# Patient Record
Sex: Male | Born: 1935 | Race: White | Hispanic: No | Marital: Married | Smoking: Never smoker
Health system: Southern US, Community
[De-identification: ages and names within clinical notes are randomized; demographics above are authoritative.]

## PROBLEM LIST (undated history)

## (undated) DIAGNOSIS — S7291XA Unspecified fracture of right femur, initial encounter for closed fracture: Secondary | ICD-10-CM

## (undated) DIAGNOSIS — K219 Gastro-esophageal reflux disease without esophagitis: Secondary | ICD-10-CM

## (undated) DIAGNOSIS — C61 Malignant neoplasm of prostate: Secondary | ICD-10-CM

## (undated) DIAGNOSIS — R339 Retention of urine, unspecified: Secondary | ICD-10-CM

## (undated) DIAGNOSIS — R234 Changes in skin texture: Secondary | ICD-10-CM

## (undated) DIAGNOSIS — I499 Cardiac arrhythmia, unspecified: Secondary | ICD-10-CM

## (undated) DIAGNOSIS — F419 Anxiety disorder, unspecified: Secondary | ICD-10-CM

## (undated) DIAGNOSIS — L98424 Non-pressure chronic ulcer of back with necrosis of bone: Secondary | ICD-10-CM

## (undated) DIAGNOSIS — N390 Urinary tract infection, site not specified: Secondary | ICD-10-CM

## (undated) DIAGNOSIS — K429 Umbilical hernia without obstruction or gangrene: Secondary | ICD-10-CM

## (undated) DIAGNOSIS — C679 Malignant neoplasm of bladder, unspecified: Secondary | ICD-10-CM

## (undated) DIAGNOSIS — I10 Essential (primary) hypertension: Secondary | ICD-10-CM

## (undated) DIAGNOSIS — M1990 Unspecified osteoarthritis, unspecified site: Secondary | ICD-10-CM

## (undated) DIAGNOSIS — Z9289 Personal history of other medical treatment: Secondary | ICD-10-CM

## (undated) DIAGNOSIS — D509 Iron deficiency anemia, unspecified: Secondary | ICD-10-CM

## (undated) HISTORY — DX: Iron deficiency anemia, unspecified: D50.9

## (undated) HISTORY — PX: CARPAL TUNNEL RELEASE: SHX101

## (undated) HISTORY — DX: Malignant neoplasm of bladder, unspecified: C67.9

---
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
## 2002-11-07 HISTORY — PX: CHOLECYSTECTOMY: SHX55

## 2010-09-24 ENCOUNTER — Other Ambulatory Visit: Payer: Self-pay | Admitting: Internal Medicine

## 2010-09-29 ENCOUNTER — Ambulatory Visit: Payer: Self-pay | Admitting: Internal Medicine

## 2010-10-11 ENCOUNTER — Ambulatory Visit: Payer: Self-pay | Admitting: Orthopedic Surgery

## 2011-01-27 DIAGNOSIS — C61 Malignant neoplasm of prostate: Secondary | ICD-10-CM | POA: Insufficient documentation

## 2011-02-08 ENCOUNTER — Ambulatory Visit: Payer: Self-pay | Admitting: Urology

## 2011-02-10 ENCOUNTER — Ambulatory Visit: Payer: Self-pay | Admitting: Urology

## 2011-03-15 ENCOUNTER — Ambulatory Visit: Payer: Self-pay | Admitting: Radiation Oncology

## 2011-04-08 ENCOUNTER — Ambulatory Visit: Payer: Self-pay | Admitting: Radiation Oncology

## 2011-05-08 ENCOUNTER — Ambulatory Visit: Payer: Self-pay | Admitting: Radiation Oncology

## 2011-06-08 ENCOUNTER — Ambulatory Visit: Payer: Self-pay | Admitting: Radiation Oncology

## 2011-07-09 ENCOUNTER — Ambulatory Visit: Payer: Self-pay | Admitting: Radiation Oncology

## 2011-08-08 ENCOUNTER — Ambulatory Visit: Payer: Self-pay | Admitting: Radiation Oncology

## 2011-11-21 ENCOUNTER — Ambulatory Visit: Payer: Self-pay | Admitting: Radiation Oncology

## 2011-12-09 ENCOUNTER — Ambulatory Visit: Payer: Self-pay | Admitting: Radiation Oncology

## 2012-05-23 ENCOUNTER — Ambulatory Visit: Payer: Self-pay | Admitting: Radiation Oncology

## 2012-05-24 LAB — PSA: PSA: 0.2 ng/mL (ref 0.0–4.0)

## 2012-06-07 ENCOUNTER — Ambulatory Visit: Payer: Self-pay | Admitting: Radiation Oncology

## 2013-05-21 ENCOUNTER — Ambulatory Visit: Payer: Self-pay | Admitting: Radiation Oncology

## 2013-06-07 ENCOUNTER — Ambulatory Visit: Payer: Self-pay | Admitting: Radiation Oncology

## 2014-05-23 ENCOUNTER — Ambulatory Visit: Payer: Self-pay | Admitting: Radiation Oncology

## 2014-06-24 DIAGNOSIS — M169 Osteoarthritis of hip, unspecified: Secondary | ICD-10-CM | POA: Insufficient documentation

## 2014-06-24 DIAGNOSIS — M5416 Radiculopathy, lumbar region: Secondary | ICD-10-CM | POA: Insufficient documentation

## 2015-01-26 DIAGNOSIS — Z7185 Encounter for immunization safety counseling: Secondary | ICD-10-CM | POA: Insufficient documentation

## 2015-01-26 DIAGNOSIS — Z7189 Other specified counseling: Secondary | ICD-10-CM | POA: Insufficient documentation

## 2015-05-27 ENCOUNTER — Inpatient Hospital Stay: Payer: Medicare Other | Attending: Radiation Oncology

## 2015-05-27 ENCOUNTER — Ambulatory Visit
Admission: RE | Admit: 2015-05-27 | Discharge: 2015-05-27 | Disposition: A | Discharge status: Home / Self Care | Payer: Medicare Other | Source: Ambulatory Visit | Attending: Radiation Oncology | Admitting: Radiation Oncology

## 2015-05-27 ENCOUNTER — Other Ambulatory Visit: Payer: Self-pay | Admitting: *Deleted

## 2015-05-27 ENCOUNTER — Encounter: Payer: Self-pay | Admitting: Radiation Oncology

## 2015-05-27 VITALS — BP 118/70 | HR 85 | Temp 95.7°F | Resp 20 | Wt 195.9 lb

## 2015-05-27 DIAGNOSIS — C61 Malignant neoplasm of prostate: Secondary | ICD-10-CM

## 2015-05-27 HISTORY — DX: Unspecified osteoarthritis, unspecified site: M19.90

## 2015-05-27 HISTORY — DX: Anxiety disorder, unspecified: F41.9

## 2015-05-27 LAB — PSA: PSA: 0.05 ng/mL (ref 0.00–4.00)

## 2015-05-27 NOTE — Progress Notes (Signed)
Radiation Oncology Follow up Note  Name: TURNEY BLOUNT   Date:   05/27/2015 MRN:  [id number] DOB: 12-Apr-1935    This 80 y.o. male presents to the clinic today for follow-up for prostate cancer.  REFERRING PROVIDER: No ref. provider found  HPI: patient is a 80 year old male now out 4 years having completed IM RT radiation therapy for a Gleason 7 adenocarcinoma the prostate presenting the PSA of 29. He was a clinical stage IIB. We treated both his prostate and pelvic nodes. He is seen today in routine follow-up and is doing well. He specifically denies diarrhea dysuria or any other GI/GU complaints. His PSAs have been stable and less than 1 over the past several years..  COMPLICATIONS OF TREATMENT: none  FOLLOW UP COMPLIANCE: keeps appointments   PHYSICAL EXAM:  BP 118/70 mmHg  Pulse 85  Temp(Src) 95.7 F (35.4 C) (Oral)  Resp 20  Wt 195 lb 14.1 oz (88.85 kg) On rectal exam rectal sphincter tone is good. Prostate is smooth contracted without evidence of nodularity or mass. Sulcus is preserved bilaterally. No discrete nodularity is identified. No other rectal abnormalities are noted. Well-developed well-nourished patient in NAD. HEENT reveals PERLA, EOMI, discs not visualized.  Oral cavity is clear. No oral mucosal lesions are identified. Neck is clear without evidence of cervical or supraclavicular adenopathy. Lungs are clear to A&P. Cardiac examination is essentially unremarkable with regular rate and rhythm without murmur rub or thrill. Abdomen is benign with no organomegaly or masses noted. Motor sensory and DTR levels are equal and symmetric in the upper and lower extremities. Cranial nerves II through XII are grossly intact. Proprioception is intact. No peripheral adenopathy or edema is identified. No motor or sensory levels are noted. Crude visual fields are within normal range.   RADIOLOGY RESULTS: no current films to review  PLAN: at the present time he continues to do well. I  have run a PSA level on him today and will report that separately. I'm otherwise please was overall progress. I've asked to see him back in 1 year for follow-up. Patient is to call sooner with any concerns. Should his PSA show any significant change will make further recommendations.  I would like to take this opportunity for allowing me to participate in the care of your patient.Armstead Peaks., MD

## 2015-06-03 ENCOUNTER — Telehealth: Payer: Self-pay | Admitting: *Deleted

## 2015-06-03 NOTE — Telephone Encounter (Signed)
Pt is requesting her husband's PSA results.Marland KitchenMarland Kitchen

## 2015-12-18 ENCOUNTER — Other Ambulatory Visit: Payer: Medicare Other

## 2015-12-18 ENCOUNTER — Ambulatory Visit: Payer: Medicare Other | Admitting: Radiation Oncology

## 2015-12-24 ENCOUNTER — Other Ambulatory Visit: Payer: Self-pay | Admitting: *Deleted

## 2015-12-24 ENCOUNTER — Ambulatory Visit
Admission: RE | Admit: 2015-12-24 | Discharge: 2015-12-24 | Disposition: A | Discharge status: Home / Self Care | Payer: Medicare Other | Source: Ambulatory Visit | Attending: Radiation Oncology | Admitting: Radiation Oncology

## 2015-12-24 ENCOUNTER — Inpatient Hospital Stay: Payer: Medicare Other | Attending: Radiation Oncology

## 2015-12-24 ENCOUNTER — Encounter: Payer: Self-pay | Admitting: Radiation Oncology

## 2015-12-24 VITALS — BP 146/75 | HR 77 | Resp 20 | Ht 71.0 in | Wt 208.1 lb

## 2015-12-24 DIAGNOSIS — C61 Malignant neoplasm of prostate: Secondary | ICD-10-CM

## 2015-12-24 DIAGNOSIS — Z8546 Personal history of malignant neoplasm of prostate: Secondary | ICD-10-CM | POA: Diagnosis not present

## 2015-12-24 LAB — PSA: PSA: 0.05 ng/mL (ref 0.00–4.00)

## 2015-12-24 NOTE — Progress Notes (Signed)
Radiation Oncology Follow up Note  Name: Vincent Poole   Date:   12/24/2015 MRN:  [id number] DOB: [birth year]    This 81 y.o. male presents to the clinic today for follow-up for prostate cancer stage IIB now out over 5 years.  REFERRING PROVIDER: Melynda Ripple, MD  HPI: Patient is a 81 year old male now out over 5 years having completed radiation therapy for Gleason 7 adenocarcinoma the prostate presenting the PSA of 29. His initial stage was to be. We treated both prostate and pelvic nodes. He is seen today in routine follow-up and is doing well.Marland Kitchen His PSA has remained in the undetectable range last one was 0.05 back in July 2016. He had a repeat PSA today which will be reported separately. His major complaint is swelling and occasional pain of his right breast. He otherwise is without complaint specifically denies increased lower urinary tract symptoms or diarrhea.  COMPLICATIONS OF TREATMENT: none  FOLLOW UP COMPLIANCE: keeps appointments   PHYSICAL EXAM:  BP 146/75 mmHg  Pulse 77  Resp 20  Ht 5\' 11"  (1.803 m)  Wt 208 lb 1.8 oz (94.4 kg)  BMI 29.04 kg/m2 On rectal exam rectal sphincter tone is good. Prostate is smooth contracted without evidence of nodularity or mass. Sulcus is preserved bilaterally. No discrete nodularity is identified. No other rectal abnormalities are noted. Right breast is somewhat diffusely enlarged consistent with gynecomastia no discrete nodularity or mass is identified. No axillary or supraclavicular adenopathy is noted. Well-developed well-nourished patient in NAD. HEENT reveals PERLA, EOMI, discs not visualized.  Oral cavity is clear. No oral mucosal lesions are identified. Neck is clear without evidence of cervical or supraclavicular adenopathy. Lungs are clear to A&P. Cardiac examination is essentially unremarkable with regular rate and rhythm without murmur rub or thrill. Abdomen is benign with no organomegaly or masses noted. Motor sensory and DTR levels are  equal and symmetric in the upper and lower extremities. Cranial nerves II through XII are grossly intact. Proprioception is intact. No peripheral adenopathy or edema is identified. No motor or sensory levels are noted. Crude visual fields are within normal range.  RADIOLOGY RESULTS: No current films for review  PLAN: I wrote a PSA level on him today and will report that separately. He is done extremely well with undetectable PSA levels over time. Not quite sure what to do about his presumed right gynecomastia. Referral for surgeon or possible short trial of tamoxifen should his symptoms worsen may be appropriate. I've otherwise asked to see him back in 1 year for follow-up. Patient is to call sooner with any concerns.  I would like to take this opportunity for allowing me to participate in the care of your patient.Armstead Peaks., MD

## 2016-01-26 DIAGNOSIS — R7303 Prediabetes: Secondary | ICD-10-CM | POA: Insufficient documentation

## 2016-07-10 DIAGNOSIS — Z923 Personal history of irradiation: Secondary | ICD-10-CM

## 2016-07-10 DIAGNOSIS — R300 Dysuria: Secondary | ICD-10-CM | POA: Diagnosis not present

## 2016-07-10 DIAGNOSIS — Z8546 Personal history of malignant neoplasm of prostate: Secondary | ICD-10-CM

## 2016-07-10 DIAGNOSIS — B964 Proteus (mirabilis) (morganii) as the cause of diseases classified elsewhere: Secondary | ICD-10-CM | POA: Diagnosis present

## 2016-07-10 DIAGNOSIS — N3001 Acute cystitis with hematuria: Secondary | ICD-10-CM | POA: Diagnosis present

## 2016-07-10 DIAGNOSIS — I959 Hypotension, unspecified: Secondary | ICD-10-CM | POA: Diagnosis present

## 2016-07-10 DIAGNOSIS — I248 Other forms of acute ischemic heart disease: Secondary | ICD-10-CM | POA: Diagnosis present

## 2016-07-10 DIAGNOSIS — A419 Sepsis, unspecified organism: Principal | ICD-10-CM | POA: Diagnosis present

## 2016-07-10 DIAGNOSIS — N138 Other obstructive and reflux uropathy: Secondary | ICD-10-CM | POA: Diagnosis present

## 2016-07-10 DIAGNOSIS — N401 Enlarged prostate with lower urinary tract symptoms: Secondary | ICD-10-CM | POA: Diagnosis present

## 2016-07-10 DIAGNOSIS — Z8249 Family history of ischemic heart disease and other diseases of the circulatory system: Secondary | ICD-10-CM

## 2016-07-10 DIAGNOSIS — Z9221 Personal history of antineoplastic chemotherapy: Secondary | ICD-10-CM

## 2016-07-10 DIAGNOSIS — M1990 Unspecified osteoarthritis, unspecified site: Secondary | ICD-10-CM | POA: Diagnosis present

## 2016-07-10 DIAGNOSIS — R338 Other retention of urine: Secondary | ICD-10-CM | POA: Diagnosis present

## 2016-07-11 ENCOUNTER — Inpatient Hospital Stay
Admission: EM | Admit: 2016-07-11 | Discharge: 2016-07-13 | DRG: 872 | Disposition: A | Discharge status: Home / Self Care | Payer: Medicare Other | Attending: Internal Medicine | Admitting: Internal Medicine

## 2016-07-11 ENCOUNTER — Encounter: Payer: Self-pay | Admitting: Emergency Medicine

## 2016-07-11 DIAGNOSIS — R000 Tachycardia, unspecified: Secondary | ICD-10-CM

## 2016-07-11 DIAGNOSIS — R300 Dysuria: Secondary | ICD-10-CM

## 2016-07-11 DIAGNOSIS — B964 Proteus (mirabilis) (morganii) as the cause of diseases classified elsewhere: Secondary | ICD-10-CM | POA: Diagnosis present

## 2016-07-11 DIAGNOSIS — R509 Fever, unspecified: Secondary | ICD-10-CM

## 2016-07-11 DIAGNOSIS — M1990 Unspecified osteoarthritis, unspecified site: Secondary | ICD-10-CM | POA: Diagnosis present

## 2016-07-11 DIAGNOSIS — R339 Retention of urine, unspecified: Secondary | ICD-10-CM

## 2016-07-11 DIAGNOSIS — Z923 Personal history of irradiation: Secondary | ICD-10-CM | POA: Diagnosis not present

## 2016-07-11 DIAGNOSIS — I248 Other forms of acute ischemic heart disease: Secondary | ICD-10-CM | POA: Diagnosis present

## 2016-07-11 DIAGNOSIS — I959 Hypotension, unspecified: Secondary | ICD-10-CM | POA: Diagnosis present

## 2016-07-11 DIAGNOSIS — Z8249 Family history of ischemic heart disease and other diseases of the circulatory system: Secondary | ICD-10-CM | POA: Diagnosis not present

## 2016-07-11 DIAGNOSIS — N390 Urinary tract infection, site not specified: Secondary | ICD-10-CM

## 2016-07-11 DIAGNOSIS — A419 Sepsis, unspecified organism: Secondary | ICD-10-CM | POA: Diagnosis present

## 2016-07-11 DIAGNOSIS — N138 Other obstructive and reflux uropathy: Secondary | ICD-10-CM | POA: Diagnosis present

## 2016-07-11 DIAGNOSIS — N401 Enlarged prostate with lower urinary tract symptoms: Secondary | ICD-10-CM | POA: Diagnosis present

## 2016-07-11 DIAGNOSIS — Z9221 Personal history of antineoplastic chemotherapy: Secondary | ICD-10-CM | POA: Diagnosis not present

## 2016-07-11 DIAGNOSIS — Z8546 Personal history of malignant neoplasm of prostate: Secondary | ICD-10-CM | POA: Diagnosis not present

## 2016-07-11 DIAGNOSIS — R338 Other retention of urine: Secondary | ICD-10-CM | POA: Diagnosis present

## 2016-07-11 DIAGNOSIS — N3001 Acute cystitis with hematuria: Secondary | ICD-10-CM | POA: Diagnosis present

## 2016-07-11 HISTORY — DX: Malignant neoplasm of prostate: C61

## 2016-07-11 LAB — CBC WITH DIFFERENTIAL/PLATELET
Basophils Absolute: 0.2 10*3/uL — ABNORMAL HIGH (ref 0–0.1)
Basophils Relative: 2 %
EOS ABS: 0.1 10*3/uL (ref 0–0.7)
Eosinophils Relative: 1 %
HCT: 39.6 % — ABNORMAL LOW (ref 40.0–52.0)
HEMOGLOBIN: 13.5 g/dL (ref 13.0–18.0)
LYMPHS ABS: 0.2 10*3/uL — AB (ref 1.0–3.6)
LYMPHS PCT: 2 %
MCH: 30.5 pg (ref 26.0–34.0)
MCHC: 34.1 g/dL (ref 32.0–36.0)
MCV: 89.4 fL (ref 80.0–100.0)
MONOS PCT: 2 %
Monocytes Absolute: 0.2 10*3/uL (ref 0.2–1.0)
NEUTROS PCT: 93 %
Neutro Abs: 11 10*3/uL — ABNORMAL HIGH (ref 1.4–6.5)
Platelets: 190 10*3/uL (ref 150–440)
RBC: 4.43 MIL/uL (ref 4.40–5.90)
RDW: 14.5 % (ref 11.5–14.5)
WBC: 11.7 10*3/uL — ABNORMAL HIGH (ref 3.8–10.6)

## 2016-07-11 LAB — COMPREHENSIVE METABOLIC PANEL
ALT: 14 U/L — AB (ref 17–63)
AST: 20 U/L (ref 15–41)
Albumin: 3.6 g/dL (ref 3.5–5.0)
Alkaline Phosphatase: 79 U/L (ref 38–126)
Anion gap: 6 (ref 5–15)
BUN: 27 mg/dL — AB (ref 6–20)
CHLORIDE: 101 mmol/L (ref 101–111)
CO2: 26 mmol/L (ref 22–32)
CREATININE: 1.16 mg/dL (ref 0.61–1.24)
Calcium: 8.7 mg/dL — ABNORMAL LOW (ref 8.9–10.3)
GFR calc non Af Amer: 58 mL/min — ABNORMAL LOW (ref >60)
Glucose, Bld: 120 mg/dL — ABNORMAL HIGH (ref 65–99)
POTASSIUM: 3.2 mmol/L — AB (ref 3.5–5.1)
SODIUM: 133 mmol/L — AB (ref 135–145)
Total Bilirubin: 1.3 mg/dL — ABNORMAL HIGH (ref 0.3–1.2)
Total Protein: 6.4 g/dL — ABNORMAL LOW (ref 6.5–8.1)

## 2016-07-11 LAB — URINALYSIS COMPLETE WITH MICROSCOPIC (ARMC ONLY)
BILIRUBIN URINE: NEGATIVE
GLUCOSE, UA: 50 mg/dL — AB
NITRITE: NEGATIVE
SPECIFIC GRAVITY, URINE: 1.017 (ref 1.005–1.030)
Squamous Epithelial / LPF: NONE SEEN
pH: 5 (ref 5.0–8.0)

## 2016-07-11 LAB — BLOOD GAS, VENOUS
ACID-BASE EXCESS: 1.1 mmol/L (ref 0.0–2.0)
BICARBONATE: 23.4 mmol/L (ref 20.0–28.0)
O2 SAT: 83.4 %
PATIENT TEMPERATURE: 37
PH VEN: 7.5 — AB (ref 7.250–7.430)
pCO2, Ven: 30 mmHg — ABNORMAL LOW (ref 44.0–60.0)
pO2, Ven: 43 mmHg (ref 32.0–45.0)

## 2016-07-11 LAB — CBC
HCT: 35.7 % — ABNORMAL LOW (ref 40.0–52.0)
Hemoglobin: 12.6 g/dL — ABNORMAL LOW (ref 13.0–18.0)
MCH: 32 pg (ref 26.0–34.0)
MCHC: 35.4 g/dL (ref 32.0–36.0)
MCV: 90.4 fL (ref 80.0–100.0)
PLATELETS: 172 10*3/uL (ref 150–440)
RBC: 3.96 MIL/uL — ABNORMAL LOW (ref 4.40–5.90)
RDW: 14.9 % — AB (ref 11.5–14.5)
WBC: 12.8 10*3/uL — ABNORMAL HIGH (ref 3.8–10.6)

## 2016-07-11 LAB — TROPONIN I
Troponin I: 0.04 ng/mL (ref <0.03)
Troponin I: 0.2 ng/mL (ref <0.03)

## 2016-07-11 LAB — CREATININE, SERUM
Creatinine, Ser: 1.06 mg/dL (ref 0.61–1.24)
GFR calc Af Amer: >60 mL/min (ref >60)
GFR calc non Af Amer: >60 mL/min (ref >60)

## 2016-07-11 LAB — LACTIC ACID, PLASMA
LACTIC ACID, VENOUS: 1.6 mmol/L (ref 0.5–1.9)
Lactic Acid, Venous: 0.9 mmol/L (ref 0.5–1.9)

## 2016-07-11 MED ORDER — HYDROCODONE-ACETAMINOPHEN 5-325 MG PO TABS: 1.0000 | ORAL_TABLET | ORAL | Status: DC | PRN

## 2016-07-11 MED ORDER — ACETAMINOPHEN 650 MG RE SUPP: 650.0000 mg | Freq: Four times a day (QID) | RECTAL | Status: DC | PRN

## 2016-07-11 MED ORDER — ONDANSETRON HCL 4 MG PO TABS: 4.0000 mg | ORAL_TABLET | Freq: Four times a day (QID) | ORAL | Status: DC | PRN

## 2016-07-11 MED ORDER — DEXTROSE 5 % IV SOLN
1.0000 g | Freq: Once | INTRAVENOUS | Status: AC
  Administered 2016-07-11: 1 g via INTRAVENOUS
  Filled 2016-07-11: qty 10

## 2016-07-11 MED ORDER — ACETAMINOPHEN 500 MG PO TABS
1000.0000 mg | ORAL_TABLET | Freq: Once | ORAL | Status: AC
  Administered 2016-07-11: 1000 mg via ORAL
  Filled 2016-07-11: qty 2

## 2016-07-11 MED ORDER — DEXTROSE 5 % IV SOLN: 2.0000 g | Freq: Once | INTRAVENOUS | Status: DC

## 2016-07-11 MED ORDER — ACETAMINOPHEN 325 MG PO TABS
650.0000 mg | ORAL_TABLET | Freq: Four times a day (QID) | ORAL | Status: DC | PRN
  Administered 2016-07-11: 650 mg via ORAL
  Filled 2016-07-11: qty 2

## 2016-07-11 MED ORDER — SODIUM CHLORIDE 0.9 % IV BOLUS (SEPSIS)
1000.0000 mL | Freq: Once | INTRAVENOUS | Status: AC
  Administered 2016-07-11: 1000 mL via INTRAVENOUS

## 2016-07-11 MED ORDER — ENOXAPARIN SODIUM 40 MG/0.4ML ~~LOC~~ SOLN
40.0000 mg | Freq: Every day | SUBCUTANEOUS | Status: DC
  Administered 2016-07-11 – 2016-07-12 (×2): 40 mg via SUBCUTANEOUS
  Filled 2016-07-11 (×2): qty 0.4

## 2016-07-11 MED ORDER — SENNOSIDES-DOCUSATE SODIUM 8.6-50 MG PO TABS: 1.0000 | ORAL_TABLET | Freq: Every evening | ORAL | Status: DC | PRN

## 2016-07-11 MED ORDER — CEFTRIAXONE SODIUM 2 G IJ SOLR
2.0000 g | INTRAMUSCULAR | Status: DC
  Administered 2016-07-11 – 2016-07-12 (×2): 2 g via INTRAVENOUS
  Filled 2016-07-11 (×3): qty 2

## 2016-07-11 MED ORDER — TAMSULOSIN HCL 0.4 MG PO CAPS
0.4000 mg | ORAL_CAPSULE | Freq: Every day | ORAL | Status: DC
  Administered 2016-07-11 – 2016-07-12 (×2): 0.4 mg via ORAL
  Filled 2016-07-11 (×2): qty 1

## 2016-07-11 MED ORDER — FINASTERIDE 5 MG PO TABS
5.0000 mg | ORAL_TABLET | Freq: Every day | ORAL | Status: DC
  Administered 2016-07-11 – 2016-07-13 (×3): 5 mg via ORAL
  Filled 2016-07-11 (×3): qty 1

## 2016-07-11 MED ORDER — SODIUM CHLORIDE 0.9 % IV SOLN
INTRAVENOUS | Status: DC
  Administered 2016-07-11 (×2): via INTRAVENOUS

## 2016-07-11 MED ORDER — ONDANSETRON HCL 4 MG/2ML IJ SOLN
4.0000 mg | Freq: Four times a day (QID) | INTRAMUSCULAR | Status: DC | PRN
  Administered 2016-07-11: 4 mg via INTRAVENOUS
  Filled 2016-07-11: qty 2

## 2016-07-11 NOTE — Progress Notes (Addendum)
Pharmacy Antibiotic Note  Vincent Poole is a 81 y.o. male admitted on 07/11/2016 with UTI/sepsis.  Pharmacy has been consulted for ceftriaxone dosing.  Plan: Ceftriaxone 2 gm IV Q24H  Height: 5\' 9"  (175.3 cm) Weight: 201 lb (91.2 kg) IBW/kg (Calculated) : 70.7  Temp (24hrs), Avg:99.4 F (37.4 C), Min:98.2 F (36.8 C), Max:100.5 F (38.1 C)  No results for input(s): WBC, CREATININE, LATICACIDVEN, VANCOTROUGH, VANCOPEAK, VANCORANDOM, GENTTROUGH, GENTPEAK, GENTRANDOM, TOBRATROUGH, TOBRAPEAK, TOBRARND, AMIKACINPEAK, AMIKACINTROU, AMIKACIN in the last 168 hours.  CrCl cannot be calculated (No order found.).    No Known Allergies   Thank you for allowing pharmacy to be a part of this patient's care.  Laural Benes, Pharm.D., BCPS Clinical Pharmacist 07/11/2016 2:29 AM

## 2016-07-11 NOTE — Progress Notes (Signed)
[facility] PROGRESS NOTE  GRADY STROHSCHEIN [id number] DOB: September 03, 1935 DOA: 07/11/2016 PCP: [name], MD  HPI/Subjective: Patient states he feels all right now. Difficulty urinating prior to coming in. History of prostate cancer treated with radiation and chemotherapy as per the wife.  Objective: Vitals:   07/11/16 0651 07/11/16 1218  BP: (!) 107/57   Pulse: 93   Resp: 18   Temp: 98 F (36.7 C) (!) 102.7 F (39.3 C)    Filed Weights   07/10/16 2346 07/11/16 0651  Weight: 91.2 kg (201 lb) 93.7 kg (206 lb 8 oz)    ROS: Review of Systems  Constitutional: Positive for fever. Negative for chills.  Eyes: Negative for blurred vision.  Respiratory: Negative for cough and shortness of breath.   Cardiovascular: Negative for chest pain.  Gastrointestinal: Negative for abdominal pain, constipation, diarrhea, nausea and vomiting.  Genitourinary: Negative for dysuria.  Musculoskeletal: Negative for joint pain.  Neurological: Negative for dizziness and headaches.   Exam: Physical Exam  Constitutional: He is oriented to person, place, and time.  HENT:  Nose: No mucosal edema.  Mouth/Throat: No oropharyngeal exudate or posterior oropharyngeal edema.  Eyes: Conjunctivae, EOM and lids are normal. Pupils are equal, round, and reactive to light.  Neck: No JVD present. Carotid bruit is not present. No edema present. No thyroid mass and no thyromegaly present.  Cardiovascular: S1 normal and S2 normal.  Exam reveals no gallop.   No murmur heard. Pulses:      Dorsalis pedis pulses are 2+ on the right side, and 2+ on the left side.  Respiratory: No respiratory distress. He has decreased breath sounds in the right lower field and the left lower field. He has no wheezes. He has no rhonchi. He has no rales.  GI: Soft. Bowel sounds are normal. There is no tenderness.  Musculoskeletal:       Right ankle: He exhibits swelling.       Left ankle: He exhibits swelling.   Lymphadenopathy:    He has no cervical adenopathy.  Neurological: He is alert and oriented to person, place, and time. No cranial nerve deficit.  Skin: Skin is warm. No rash noted. Nails show no clubbing.  Psychiatric: He has a normal mood and affect.      Data Reviewed: Basic Metabolic Panel:  Recent Labs Lab 07/11/16 0233 07/11/16 0543  NA 133*  --   K 3.2*  --   CL 101  --   CO2 26  --   GLUCOSE 120*  --   BUN 27*  --   CREATININE 1.16 1.06  CALCIUM 8.7*  --    Liver Function Tests:  Recent Labs Lab 07/11/16 0233  AST 20  ALT 14*  ALKPHOS 79  BILITOT 1.3*  PROT 6.4*  ALBUMIN 3.6   CBC:  Recent Labs Lab 07/11/16 0233 07/11/16 0543  WBC 11.7* 12.8*  NEUTROABS 11.0*  --   HGB 13.5 12.6*  HCT 39.6* 35.7*  MCV 89.4 90.4  PLT 190 172   Cardiac Enzymes:  Recent Labs Lab 07/11/16 0233 07/11/16 0543  TROPONINI 0.04* 0.20*     Recent Results (from the past 240 hour(s))  Blood Culture (routine x 2)     Status: None (Preliminary result)   Collection Time: 07/11/16  2:33 AM  Result Value Ref Range Status   Specimen Description BLOOD RIGHT ANTECUBITAL  Final   Special Requests BOTTLES DRAWN AEROBIC AND ANAEROBIC 8CC  Final   Culture NO GROWTH < 12  HOURS  Final   Report Status PENDING  Incomplete  Blood Culture (routine x 2)     Status: None (Preliminary result)   Collection Time: 07/11/16  2:33 AM  Result Value Ref Range Status   Specimen Description BLOOD BLOOD LEFT WRIST  Final   Special Requests BOTTLES DRAWN AEROBIC AND ANAEROBIC 8CC  Final   Culture NO GROWTH < 12 HOURS  Final   Report Status PENDING  Incomplete      Scheduled Meds: . cefTRIAXone (ROCEPHIN)  IV  2 g Intravenous Q24H  . enoxaparin (LOVENOX) injection  40 mg Subcutaneous Daily  . finasteride  5 mg Oral Daily  . tamsulosin  0.4 mg Oral QPC supper   Continuous Infusions: . sodium chloride 50 mL/hr at 07/11/16 1222    Assessment/Plan:  1. Clinical sepsis present on  admission with fever and tachycardia and leukocytosis. Acute cystitis with hematuria is the likely source secondary to urinary retention. Continue IV Rocephin and watch for urine culture results. So far blood cultures are negative. 2. Urinary retention and BPH. Start finasteride and Flomax.  Foley catheter to remain in for 1 week. Nursing staff to teach the family had a drain urinary catheter. 3. Relative hypotension hold lisinopril HCT 4. Arthritis hold Naprosyn 5. History of prostate cancer. Will need urology as outpatient.  Code Status:     Code Status Orders        Start     Ordered   07/11/16 0537  Full code  Continuous     07/11/16 0538    Code Status History    Date Active Date Inactive Code Status Order ID Comments User Context   This patient has a current code status but no historical code status.     Family Communication: Wife at the bedside Disposition Plan: Home in a few days  Antibiotics:  Rocephin  Time spent: 35 minutes  [place], [place]

## 2016-07-11 NOTE — ED Provider Notes (Signed)
[facility] Emergency Department Provider Note   ____________________________________________   First MD Initiated Contact with Patient 07/11/16 0222     (approximate)  I have reviewed the triage vital signs and the nursing notes.   HISTORY  Chief Complaint Dysuria and Urinary Retention    HPI SHON KELTY is a 81 y.o. male who presents to the ED from home with a chief complaint of urinary retention. Reports history of prostate cancer status post radiation who has been having urinary symptoms for the past several weeks, worse in the past 2 days. Patient reports he has been unable to urinate since 4 PM. States he had hematuria last night but none since. Symptoms associated with pain on attempting urination. Denies associated fever, chills, chest pain, shortness of breath, nausea, vomiting, diarrhea.Denies recent travel or trauma. Nothing makes his symptoms better or worse.   Past Medical History:  Diagnosis Date  . Anxiety   . Arthritis   . Cervical cancer ([place])     There are no active problems to display for this patient.   Past Surgical History:  Procedure Laterality Date  . APPENDECTOMY      Prior to Admission medications   Medication Sig Start Date End Date Taking? Authorizing Provider  lisinopril-hydrochlorothiazide (PRINZIDE,ZESTORETIC) 10-12.5 MG per tablet Take by mouth. 07/03/12  Yes Historical Provider, MD  naproxen sodium (RA NAPROXEN SODIUM) 220 MG tablet Take by mouth. Reported on 12/24/2015   Yes Historical Provider, MD    Allergies Review of patient's allergies indicates no known allergies.  History reviewed. No pertinent family history.  Social History Social History  Substance Use Topics  . Smoking status: Never Smoker  . Smokeless tobacco: Never Used  . Alcohol use No    Review of Systems  Constitutional: No fever/chills. Eyes: No visual changes. ENT: No sore throat. Cardiovascular: Denies chest  pain. Respiratory: Denies shortness of breath. Gastrointestinal: No abdominal pain.  No nausea, no vomiting.  No diarrhea.  No constipation. Genitourinary: Positive for dysuria, hematuria and urinary retention. Musculoskeletal: Negative for back pain. Skin: Negative for rash. Neurological: Negative for headaches, focal weakness or numbness.  10-point ROS otherwise negative.  ____________________________________________   PHYSICAL EXAM:  VITAL SIGNS: ED Triage Vitals  Enc Vitals Group     BP 07/10/16 2346 (!) 164/66     Pulse Rate 07/10/16 2346 97     Resp 07/11/16 0204 20     Temp 07/10/16 2346 98.2 F (36.8 C)     Temp Source 07/10/16 2346 Oral     SpO2 07/10/16 2346 100 %     Weight 07/10/16 2346 201 lb (91.2 kg)     Height 07/10/16 2346 5\' 9"  (1.753 m)     Head Circumference --      Peak Flow --      Pain Score --      Pain Loc --      Pain Edu? --      Excl. in [place]? --    Constitutional: Alert and oriented. Well appearing and in no acute distress. Eyes: Conjunctivae are normal. PERRL. EOMI. Head: Atraumatic. Nose: No congestion/rhinnorhea. Mouth/Throat: Mucous membranes are moist.  Oropharynx non-erythematous. Neck: No stridor.  Supple neck without meningismus. Cardiovascular: Tachycardic rate, regular rhythm. Grossly normal heart sounds.  Good peripheral circulation. Respiratory: Normal respiratory effort.  No retractions. Lungs CTAB. Gastrointestinal: Soft and nontender. No distention. No abdominal bruits. No CVA tenderness. Genitourinary: Foley in place which was placed in triage. Musculoskeletal: No lower  extremity tenderness nor edema.  No joint effusions. Neurologic:  Normal speech and language. No gross focal neurologic deficits are appreciated. No gait instability. Skin:  Skin is warm, dry and intact. No rash noted. No petechiae. Psychiatric: Mood and affect are normal. Speech and behavior are normal.  ____________________________________________    LABS (all labs ordered are listed, but only abnormal results are displayed)  Labs Reviewed  URINALYSIS COMPLETEWITH MICROSCOPIC ([facility] ONLY) - Abnormal; Notable for the following:       Result Value   Color, Urine YELLOW (*)    APPearance CLOUDY (*)    Glucose, UA 50 (*)    Ketones, ur TRACE (*)    Hgb urine dipstick 2+ (*)    Protein, ur >500 (*)    Leukocytes, UA 3+ (*)    Bacteria, UA MANY (*)    All other components within normal limits  COMPREHENSIVE METABOLIC PANEL - Abnormal; Notable for the following:    Sodium 133 (*)    Potassium 3.2 (*)    Glucose, Bld 120 (*)    BUN 27 (*)    Calcium 8.7 (*)    Total Protein 6.4 (*)    ALT 14 (*)    Total Bilirubin 1.3 (*)    GFR calc non Af Amer 58 (*)    All other components within normal limits  CBC WITH DIFFERENTIAL/PLATELET - Abnormal; Notable for the following:    WBC 11.7 (*)    HCT 39.6 (*)    Neutro Abs 11.0 (*)    Lymphs Abs 0.2 (*)    Basophils Absolute 0.2 (*)    All other components within normal limits  TROPONIN I - Abnormal; Notable for the following:    Troponin I 0.04 (*)    All other components within normal limits  BLOOD GAS, VENOUS - Abnormal; Notable for the following:    pH, Ven 7.50 (*)    pCO2, Ven 30 (*)    All other components within normal limits  CULTURE, BLOOD (ROUTINE X 2)  CULTURE, BLOOD (ROUTINE X 2)  URINE CULTURE  LACTIC ACID, PLASMA  LACTIC ACID, PLASMA  TROPONIN I   ____________________________________________  EKG  ED ECG REPORT I, Miu Chiong J, the attending physician, personally viewed and interpreted this ECG.   Date: 07/11/2016  EKG Time: 0234  Rate: 129  Rhythm: sinus tachycardia  Axis: Normal  Intervals:none  ST&T Change: Nonspecific  ____________________________________________  RADIOLOGY  None ____________________________________________   PROCEDURES  Procedure(s) performed: None  Procedures  Critical Care performed: Yes, see critical care note(s)    CRITICAL CARE Performed by: Paulette Blanch   Total critical care time: 30 minutes  Critical care time was exclusive of separately billable procedures and treating other patients.  Critical care was necessary to treat or prevent imminent or life-threatening deterioration.  Critical care was time spent personally by me on the following activities: development of treatment plan with patient and/or surrogate as well as nursing, discussions with consultants, evaluation of patient's response to treatment, examination of patient, obtaining history from patient or surrogate, ordering and performing treatments and interventions, ordering and review of laboratory studies, ordering and review of radiographic studies, pulse oximetry and re-evaluation of patient's condition.  ____________________________________________   INITIAL IMPRESSION / ASSESSMENT AND PLAN / ED COURSE  Pertinent labs & imaging results that were available during my care of the patient were reviewed by me and considered in my medical decision making (see chart for details).  81 year old male with a prior history  of prostate cancer status post radiation who presents with urinary retention, dysuria and hematuria. Bladder scan at triage 237mL. Foley was placed with immediate relief of patient's symptoms. Currently he is feeling better and voicing no complaints of pain. He has since spiked a fever to 100.33F and is currently tachycardic. Meets sepsis criteria; code sepsis initiated.  Clinical Course  Comment By Time  Soft SBP 90s after 3L NS. Discussed with hospitalist who will evaluate patient in the emergency department for admission. Paulette Blanch, MD 09/04 [phone]     ____________________________________________   FINAL CLINICAL IMPRESSION(S) / ED DIAGNOSES  Final diagnoses:  Sepsis, due to unspecified organism [facility])  UTI (lower urinary tract infection)  Urinary retention  Dysuria  Fever, unspecified fever cause   Tachycardia      NEW MEDICATIONS STARTED DURING THIS VISIT:  New Prescriptions   No medications on file     Note:  This document was prepared using Dragon voice recognition software and may include unintentional dictation errors.    Paulette Blanch, MD 07/11/16 [phone]

## 2016-07-11 NOTE — ED Triage Notes (Signed)
Pt reports urinary s/s for several weeks, worse in the last 2 days; pt restless and anxious in triage; unable to void since about 4pm; pt says he did have hematuria last night but none since; bladder scanner read 260ml

## 2016-07-11 NOTE — H&P (Signed)
[place] [facility] [place] NAME: Vincent Poole    MR#:  [id number]  DATE OF BIRTH:  1935/01/25  DATE OF ADMISSION:  07/11/2016  PRIMARY CARE PHYSICIAN: [name], MD   REQUESTING/REFERRING PHYSICIAN:   CHIEF COMPLAINT:   Chief Complaint  Patient presents with  . Dysuria  . Urinary Retention    HISTORY OF PRESENT ILLNESS: Vincent Poole  is a 81 y.o. male with a known history of Arthritis, prostate cancer presented to the emergency room with difficulty urinating. Patient also had some pain and burning sensation when he urinated. Patient also had some fever and chills. Foley catheter was put in the emergency room to relieve the urinary obstruction. Patient has history of prostate cancer had chemotherapy or radiation. Workup in the emergency room showed urinary tract infection. Patient also was hypotensive, code sepsis was called and patient was resuscitated with 3 L of normal saline in the emergency room. No complaints of chest pain. No history of shortness of breath. No history of headache dizziness blurry vision. Hospitalist service was consulted for further care of the patient.  PAST MEDICAL HISTORY:   Past Medical History:  Diagnosis Date  . Anxiety   . Arthritis   . Prostate cancer ([place])     PAST SURGICAL HISTORY: Past Surgical History:  Procedure Laterality Date  . APPENDECTOMY    . CHOLECYSTECTOMY      SOCIAL HISTORY:  Social History  Substance Use Topics  . Smoking status: Never Smoker  . Smokeless tobacco: Never Used  . Alcohol use No    FAMILY HISTORY:  Family History  Problem Relation Age of Onset  . Cancer Mother   . Heart disease Father     DRUG ALLERGIES: No Known Allergies  REVIEW OF SYSTEMS:   CONSTITUTIONAL: Had fever, has weakness.  EYES: No blurred or double vision.  EARS, NOSE, AND THROAT: No tinnitus or ear pain. Hard of hearing RESPIRATORY: No cough, shortness of breath, wheezing or  hemoptysis.  CARDIOVASCULAR: No chest pain, orthopnea, edema.  GASTROINTESTINAL: No nausea, vomiting, diarrhea or abdominal pain.  GENITOURINARY: Has dysuria, No hematuria.  ENDOCRINE: No polyuria, nocturia,  HEMATOLOGY: No anemia, easy bruising or bleeding SKIN: No rash or lesion. MUSCULOSKELETAL: No joint pain or arthritis.   NEUROLOGIC: No tingling, numbness, weakness.  PSYCHIATRY: No anxiety or depression.   MEDICATIONS AT HOME:  Prior to Admission medications   Medication Sig Start Date End Date Taking? Authorizing Provider  lisinopril-hydrochlorothiazide (PRINZIDE,ZESTORETIC) 10-12.5 MG per tablet Take by mouth. 07/03/12  Yes Historical Provider, MD  naproxen sodium (RA NAPROXEN SODIUM) 220 MG tablet Take by mouth. Reported on 12/24/2015   Yes Historical Provider, MD      PHYSICAL EXAMINATION:   VITAL SIGNS: Blood pressure (!) 107/58, pulse (!) 102, temperature 98.3 F (36.8 C), temperature source Oral, resp. rate 16, height 5\' 9"  (1.753 m), weight 91.2 kg (201 lb), SpO2 94 %.  GENERAL:  81 y.o.-year-old patient lying in the bed with no acute distress.  EYES: Pupils equal, round, reactive to light and accommodation. No scleral icterus. Extraocular muscles intact.  HEENT: Head atraumatic, normocephalic. Oropharynx dry and nasopharynx clear.  NECK:  Supple, no jugular venous distention. No thyroid enlargement, no tenderness.  LUNGS: Normal breath sounds bilaterally, no wheezing, rales,rhonchi or crepitation. No use of accessory muscles of respiration.  CARDIOVASCULAR: S1, S2 tachycardia No murmurs, rubs, or gallops.  ABDOMEN: Soft, nontender, nondistended. Bowel sounds present. No organomegaly or mass.  EXTREMITIES:  No pedal edema, cyanosis, or clubbing.  NEUROLOGIC: Cranial nerves II through XII are intact. Muscle strength 5/5 in all extremities. Sensation intact. Gait not checked.  PSYCHIATRIC: The patient is alert and oriented x 3.  SKIN: No obvious rash, lesion, or ulcer.    LABORATORY PANEL:   CBC  Recent Labs Lab 07/11/16 0233  WBC 11.7*  HGB 13.5  HCT 39.6*  PLT 190  MCV 89.4  MCH 30.5  MCHC 34.1  RDW 14.5  LYMPHSABS 0.2*  MONOABS 0.2  EOSABS 0.1  BASOSABS 0.2*   ------------------------------------------------------------------------------------------------------------------  Chemistries   Recent Labs Lab 07/11/16 0233  NA 133*  K 3.2*  CL 101  CO2 26  GLUCOSE 120*  BUN 27*  CREATININE 1.16  CALCIUM 8.7*  AST 20  ALT 14*  ALKPHOS 79  BILITOT 1.3*   ------------------------------------------------------------------------------------------------------------------ estimated creatinine clearance is 57.6 mL/min (by C-G formula based on SCr of 1.16 mg/dL). ------------------------------------------------------------------------------------------------------------------ No results for input(s): TSH, T4TOTAL, T3FREE, THYROIDAB in the last 72 hours.  Invalid input(s): FREET3   Coagulation profile No results for input(s): INR, PROTIME in the last 168 hours. ------------------------------------------------------------------------------------------------------------------- No results for input(s): DDIMER in the last 72 hours. -------------------------------------------------------------------------------------------------------------------  Cardiac Enzymes  Recent Labs Lab 07/11/16 0233  TROPONINI 0.04*   ------------------------------------------------------------------------------------------------------------------ Invalid input(s): POCBNP  ---------------------------------------------------------------------------------------------------------------  Urinalysis    Component Value Date/Time   COLORURINE YELLOW (A) 07/11/2016 0032   APPEARANCEUR CLOUDY (A) 07/11/2016 0032   LABSPEC 1.017 07/11/2016 0032   PHURINE 5.0 07/11/2016 0032   GLUCOSEU 50 (A) 07/11/2016 0032   HGBUR 2+ (A) 07/11/2016 0032   BILIRUBINUR  NEGATIVE 07/11/2016 0032   KETONESUR TRACE (A) 07/11/2016 0032   PROTEINUR >500 (A) 07/11/2016 0032   NITRITE NEGATIVE 07/11/2016 0032   LEUKOCYTESUR 3+ (A) 07/11/2016 0032     RADIOLOGY: No results found.  EKG: Orders placed or performed during the hospital encounter of 07/11/16  . ED EKG 12-Lead  . ED EKG 12-Lead  . EKG 12-Lead  . EKG 12-Lead    IMPRESSION AND PLAN: 81 year old male patient with history of arthritis, prostate cancer presented to the emergency room with dysuria and low blood pressure. Admitting diagnosis 1. Sepsis 2. Urinary tract infection 3. Prostate cancer 4. Hypotension secondary to sepsis  Treatment plan Admit patient to medical floor IV fluid based on sepsis protocol Start patient on IV Rocephin antibiotic 2 g daily Follow-up cultures Supportive care. .  All the records are reviewed and case discussed with ED provider. Management plans discussed with the patient, family and they are in agreement.  CODE STATUS:FULL    Code Status Orders        Start     Ordered   07/11/16 0537  Full code  Continuous     07/11/16 0538    Code Status History    Date Active Date Inactive Code Status Order ID Comments User Context   This patient has a current code status but no historical code status.       TOTAL TIME TAKING CARE OF THIS PATIENT: 50 minutes.    Saundra Shelling M.D on 07/11/2016 at 5:41 AM  Between 7am to 6pm - Pager - [phone]  After 6pm go to [email or web address] - password [email or web address] [facility]  Office  [phone]  CC: Primary care physician; [name], MD

## 2016-07-11 NOTE — Plan of Care (Signed)
Problem: Safety: Goal: Ability to remain free from injury will improve Outcome: Progressing Pt high fall risk, bed/exit alarm on. Patient and wife educated concerning high fall risk policy.

## 2016-07-12 LAB — CBC
HEMATOCRIT: 37.3 % — AB (ref 40.0–52.0)
Hemoglobin: 13 g/dL (ref 13.0–18.0)
MCH: 31.8 pg (ref 26.0–34.0)
MCHC: 34.7 g/dL (ref 32.0–36.0)
MCV: 91.5 fL (ref 80.0–100.0)
Platelets: 131 10*3/uL — ABNORMAL LOW (ref 150–440)
RBC: 4.08 MIL/uL — ABNORMAL LOW (ref 4.40–5.90)
RDW: 14.6 % — AB (ref 11.5–14.5)
WBC: 6 10*3/uL (ref 3.8–10.6)

## 2016-07-12 LAB — BASIC METABOLIC PANEL
Anion gap: 6 (ref 5–15)
BUN: 20 mg/dL (ref 6–20)
CHLORIDE: 111 mmol/L (ref 101–111)
CO2: 21 mmol/L — ABNORMAL LOW (ref 22–32)
Calcium: 8.1 mg/dL — ABNORMAL LOW (ref 8.9–10.3)
Creatinine, Ser: 0.86 mg/dL (ref 0.61–1.24)
GFR calc Af Amer: >60 mL/min (ref >60)
GFR calc non Af Amer: >60 mL/min (ref >60)
GLUCOSE: 103 mg/dL — AB (ref 65–99)
POTASSIUM: 3.4 mmol/L — AB (ref 3.5–5.1)
Sodium: 138 mmol/L (ref 135–145)

## 2016-07-12 MED ORDER — POTASSIUM CHLORIDE CRYS ER 20 MEQ PO TBCR
40.0000 meq | EXTENDED_RELEASE_TABLET | Freq: Once | ORAL | Status: AC
  Administered 2016-07-12: 18:00:00 40 meq via ORAL
  Filled 2016-07-12: qty 2

## 2016-07-12 NOTE — Evaluation (Signed)
Physical Therapy Evaluation Patient Details Name: KAILEB PERDOMO MRN: [id number] DOB: 22-Jan-1935 Today's Date: 07/12/2016   History of Present Illness  Pt admitted for sepsis. Pt with complaints of dysuria and urinary retention. Pt with history of arthritis, prostate cancer, and anxiety.   Clinical Impression  Pt is a pleasant 81 year old male who was admitted for sepsis. Pt performs bed mobility with min assist, transfers with cga, and ambulation with min assist and no AD. Further ambulation performed with RW with improved endurance/strength/balance. Recommend continued use of RW for improved balance and decreased falls risk. Pt demonstrates deficits with strength/mobility/balance. Would benefit from skilled PT to address above deficits and promote optimal return to PLOF. Recommend transition to [place] upon discharge from acute hospitalization.       Follow Up Recommendations Home health PT    Equipment Recommendations  Rolling walker with 5" wheels    Recommendations for Other Services       Precautions / Restrictions Precautions Precautions: Fall Restrictions Weight Bearing Restrictions: No      Mobility  Bed Mobility Overal bed mobility: Needs Assistance Bed Mobility: Supine to Sit     Supine to sit: Min assist     General bed mobility comments: assist for swinging B LE off EOB. Slight assist for trunk control and scooting towards EOB. Once seated at EOB, pt able to sit with supervision. Pt reports he ususally sleeps in recliner as it is hard to get out of the bed  Transfers Overall transfer level: Needs assistance Equipment used: None Transfers: Sit to/from Stand Sit to Stand: Min guard         General transfer comment: safe technique performed. Pt requires cues for correct technique  Ambulation/Gait Ambulation/Gait assistance: Min assist Ambulation Distance (Feet): 30 Feet Assistive device: None Gait Pattern/deviations: Shuffle     General Gait Details:  ambulated reaching for walls and other objects. Very unsteady and also complaining of pain in L knee. Further ambulation performed with use of RW  Stairs            Wheelchair Mobility    Modified Rankin (Stroke Patients Only)       Balance Overall balance assessment: Needs assistance Sitting-balance support: Feet supported Sitting balance-Leahy Scale: Good     Standing balance support: No upper extremity supported Standing balance-Leahy Scale: Poor                               Pertinent Vitals/Pain Pain Assessment: Faces Faces Pain Scale: Hurts a little bit Pain Location: L knee Pain Descriptors / Indicators: Aching Pain Intervention(s): Limited activity within patient's tolerance    Home Living Family/patient expects to be discharged to:: Private residence Living Arrangements: Spouse/significant other Available Help at Discharge: Family Type of Home: House Home Access: Stairs to enter Entrance Stairs-Rails:  (B rails, however unable to reach both) Entrance Stairs-Number of Steps: 3 Home Layout: One level Home Equipment: Walker - standard;Cane - single point      Prior Function Level of Independence: Independent with assistive device(s)         Comments: previously using SPC     Hand Dominance        Extremity/Trunk Assessment   Upper Extremity Assessment: Overall WFL for tasks assessed           Lower Extremity Assessment: Generalized weakness (L LE grossly 4/5; R LE grossly 5/5)         Communication  Communication: No difficulties  Cognition Arousal/Alertness: Awake/alert Behavior During Therapy: WFL for tasks assessed/performed Overall Cognitive Status: Within Functional Limits for tasks assessed                      General Comments      Exercises Other Exercises Other Exercises: Pt performed further ambulation x 200' with RW. Pt demonstrates improved balance with AD. Pt with heavy B UE weighbearing on RW  secondary to pain in L knee. No LOB noted and good gait speed demonstrating reciprocal gait pattern.      Assessment/Plan    PT Assessment Patient needs continued PT services  PT Diagnosis Difficulty walking;Acute pain   PT Problem List Decreased strength;Decreased mobility;Decreased balance  PT Treatment Interventions Gait training;Therapeutic exercise;DME instruction   PT Goals (Current goals can be found in the Care Plan section) Acute Rehab PT Goals Patient Stated Goal: to go home PT Goal Formulation: With patient Time For Goal Achievement: 07/26/16 Potential to Achieve Goals: Good    Frequency Min 2X/week   Barriers to discharge        Co-evaluation               End of Session Equipment Utilized During Treatment: Gait belt Activity Tolerance: Patient tolerated treatment well Patient left: in chair;with chair alarm set;with family/visitor present Nurse Communication: Mobility status         Time: [id number] PT Time Calculation (min) (ACUTE ONLY): 19 min   Charges:   PT Evaluation $PT Eval Moderate Complexity: 1 Procedure PT Treatments $Gait Training: 8-22 mins   PT G Codes:        Nahshon Reich 2016/07/23, 10:49 AM  Greggory Stallion, PT, DPT [phone]

## 2016-07-12 NOTE — Care Management (Signed)
Admitted to [facility] with the diagnosis of sepsis. Lives with wife, Vincent Poole x 50 years. Daughter is Vincent Poole [phone]). Uses a cane to aid in ambulation. No home Health. No skilled facility. No home oxygen. No falls. Great appetite. Takes care of all basic activities of daily living himself, drives. Prescriptions are filled at [facility]. Wife will transport. Hard of hearing Vincent Ammons RN MSN CCM Care Management [phone]

## 2016-07-12 NOTE — Progress Notes (Signed)
[name], male   DOB: Mar 31, 1935, 81 y.o.   MRN: [id number]  [employer] PROGRESS NOTE  Vincent CIOFFI [id number] DOB: 1935/07/15 DOA: 07/11/2016 PCP: [name], MD  HPI/Subjective: Patient feels okay and offers no complaints. Had fever this morning.  Objective: Vitals:   07/11/16 2035 07/12/16 0439  BP: (!) 124/57 (!) 113/54  Pulse: (!) 105 90  Resp: 17 18  Temp: 99.6 F (37.6 C) 100.1 F (37.8 C)    Filed Weights   07/10/16 2346 07/11/16 0651  Weight: 91.2 kg (201 lb) 93.7 kg (206 lb 8 oz)    ROS: Review of Systems  Constitutional: Positive for fever. Negative for chills.  Eyes: Negative for blurred vision.  Respiratory: Negative for cough and shortness of breath.   Cardiovascular: Negative for chest pain.  Gastrointestinal: Negative for abdominal pain, constipation, diarrhea, nausea and vomiting.  Genitourinary: Negative for dysuria.  Musculoskeletal: Negative for joint pain.  Neurological: Negative for dizziness and headaches.   Exam: Physical Exam  Constitutional: He is oriented to person, place, and time.  HENT:  Nose: No mucosal edema.  Mouth/Throat: No oropharyngeal exudate or posterior oropharyngeal edema.  Eyes: Conjunctivae, EOM and lids are normal. Pupils are equal, round, and reactive to light.  Neck: No JVD present. Carotid bruit is not present. No edema present. No thyroid mass and no thyromegaly present.  Cardiovascular: S1 normal and S2 normal.  Exam reveals no gallop.   No murmur heard. Pulses:      Dorsalis pedis pulses are 2+ on the right side, and 2+ on the left side.  Respiratory: No respiratory distress. He has decreased breath sounds in the right lower field and the left lower field. He has no wheezes. He has no rhonchi. He has no rales.  GI: Soft. Bowel sounds are normal. There is no tenderness.  Musculoskeletal:       Right ankle: He exhibits swelling.       Left ankle: He exhibits swelling.   Lymphadenopathy:    He has no cervical adenopathy.  Neurological: He is alert and oriented to person, place, and time. No cranial nerve deficit.  Skin: Skin is warm. No rash noted. Nails show no clubbing.  Psychiatric: He has a normal mood and affect.      Data Reviewed: Basic Metabolic Panel:  Recent Labs Lab 07/11/16 0233 07/11/16 0543 07/12/16 0417  NA 133*  --  138  K 3.2*  --  3.4*  CL 101  --  111  CO2 26  --  21*  GLUCOSE 120*  --  103*  BUN 27*  --  20  CREATININE 1.16 1.06 0.86  CALCIUM 8.7*  --  8.1*   Liver Function Tests:  Recent Labs Lab 07/11/16 0233  AST 20  ALT 14*  ALKPHOS 79  BILITOT 1.3*  PROT 6.4*  ALBUMIN 3.6   CBC:  Recent Labs Lab 07/11/16 0233 07/11/16 0543 07/12/16 0417  WBC 11.7* 12.8* 6.0  NEUTROABS 11.0*  --   --   HGB 13.5 12.6* 13.0  HCT 39.6* 35.7* 37.3*  MCV 89.4 90.4 91.5  PLT 190 172 131*   Cardiac Enzymes:  Recent Labs Lab 07/11/16 0233 07/11/16 0543  TROPONINI 0.04* 0.20*     Recent Results (from the past 240 hour(s))  Urine culture     Status: Abnormal (Preliminary result)   Collection Time: 07/11/16 12:32 AM  Result Value Ref Range Status   Specimen Description URINE, RANDOM  Final  Special Requests NONE  Final   Culture >=100,000 COLONIES/mL GRAM NEGATIVE RODS (A)  Final   Report Status PENDING  Incomplete  Blood Culture (routine x 2)     Status: None (Preliminary result)   Collection Time: 07/11/16  2:33 AM  Result Value Ref Range Status   Specimen Description BLOOD RIGHT ANTECUBITAL  Final   Special Requests BOTTLES DRAWN AEROBIC AND ANAEROBIC 8CC  Final   Culture NO GROWTH 1 DAY  Final   Report Status PENDING  Incomplete  Blood Culture (routine x 2)     Status: None (Preliminary result)   Collection Time: 07/11/16  2:33 AM  Result Value Ref Range Status   Specimen Description BLOOD BLOOD LEFT WRIST  Final   Special Requests BOTTLES DRAWN AEROBIC AND ANAEROBIC 8CC  Final   Culture NO GROWTH 1  DAY  Final   Report Status PENDING  Incomplete      Scheduled Meds: . cefTRIAXone (ROCEPHIN)  IV  2 g Intravenous Q24H  . enoxaparin (LOVENOX) injection  40 mg Subcutaneous Daily  . finasteride  5 mg Oral Daily  . tamsulosin  0.4 mg Oral QPC supper   Continuous Infusions:    Assessment/Plan:  1. Clinical sepsis present on admission with fever and tachycardia and leukocytosis. Acute cystitis with hematuria is the likely source secondary to urinary retention. Continue IV Rocephin and watch for urine culture results. So far blood cultures are negative.Urine culture growing gram-negative rods. Hopefully have sensitivities and identification tomorrow 2. Urinary retention and BPH. Start finasteride and Flomax.  Foley catheter to remain in for 1 week. Nursing staff to teach the family had a drain urinary catheter. We'll try to schedule urology appointment for voiding trial next week. 3. Relative hypotension hold lisinopril HCT 4. Arthritis hold Naprosyn 5. History of prostate cancer. Will need urology as outpatient. 6. Borderline troponin without chest pain or shortness of breath. Likely demand ischemia from sepsis.  Code Status:     Code Status Orders        Start     Ordered   07/11/16 0537  Full code  Continuous     07/11/16 0538    Code Status History    Date Active Date Inactive Code Status Order ID Comments User Context   This patient has a current code status but no historical code status.     Family Communication: Wife at the bedside Disposition Plan: Home Tomorrow if have sensitivities on urine culture and if patient afebrile.  Antibiotics:  Rocephin  Time spent: 25 minutes  Loletha Grayer  [employer]

## 2016-07-12 NOTE — Care Management Important Message (Signed)
Important Message  Patient Details  Name: Vincent Poole MRN: [id number] Date of Birth: Oct 30, 1935   Medicare Important Message Given:  Yes    Shelbie Ammons, RN 07/12/2016, 8:07 AM

## 2016-07-12 NOTE — Progress Notes (Signed)
Reported to Dr Earleen Newport potassium 3.4

## 2016-07-13 LAB — URINE CULTURE: Culture: >=100000 — AB

## 2016-07-13 MED ORDER — CIPROFLOXACIN HCL 500 MG PO TABS: 500.0000 mg | ORAL_TABLET | Freq: Two times a day (BID) | ORAL | Status: DC

## 2016-07-13 MED ORDER — CIPROFLOXACIN HCL 500 MG PO TABS: 500.0000 mg | ORAL_TABLET | Freq: Two times a day (BID) | ORAL | 0 refills | Status: DC

## 2016-07-13 MED ORDER — TAMSULOSIN HCL 0.4 MG PO CAPS: 0.4000 mg | ORAL_CAPSULE | Freq: Every day | ORAL | 0 refills | Status: DC

## 2016-07-13 MED ORDER — FINASTERIDE 5 MG PO TABS: 5.0000 mg | ORAL_TABLET | Freq: Every day | ORAL | 0 refills | Status: DC

## 2016-07-13 NOTE — Care Management (Signed)
Physical therapy evaluation completed. Recommending home with home health and physical therapy. Spoke with wife out in the hall. Declining home health at this time.  Family will transport. Discharge to home per Dr. Leslye Peer. Shelbie Ammons RN MSN CCM Care Management [phone]

## 2016-07-13 NOTE — Progress Notes (Signed)
Discharge paperwork reviewed with patient and wife. Prescriptions attached and reviewed. Foley catheter education with teach back method completed, additional education attached to discharge paperwork. All questions answered. Patient to be transported home by wife.

## 2016-07-13 NOTE — Discharge Instructions (Signed)
Acute Urinary Retention, Male Acute urinary retention is the temporary inability to urinate. This is a common problem in older men. As men age their prostates become larger and block the flow of urine from the bladder. This is usually a problem that has come on gradually.  HOME CARE INSTRUCTIONS If you are sent home with a Foley catheter and a drainage system, you will need to discuss the best course of action with your health care provider. While the catheter is in, maintain a good intake of fluids. Keep the drainage bag emptied and lower than your catheter. This is so that contaminated urine will not flow back into your bladder, which could lead to a urinary tract infection. There are two main types of drainage bags. One is a large bag that usually is used at night. It has a good capacity that will allow you to sleep through the night without having to empty it. The second type is called a leg bag. It has a smaller capacity, so it needs to be emptied more frequently. However, the main advantage is that it can be attached by a leg strap and can go underneath your clothing, allowing you the freedom to move about or leave your home. Only take over-the-counter or prescription medicines for pain, discomfort, or fever as directed by your health care provider.  SEEK MEDICAL CARE IF:  You develop a low-grade fever.  You experience spasms or leakage of urine with the spasms. SEEK IMMEDIATE MEDICAL CARE IF:   You develop chills or fever.  Your catheter stops draining urine.  Your catheter falls out.  You start to develop increased bleeding that does not respond to rest and increased fluid intake. MAKE SURE YOU:  Understand these instructions.  Will watch your condition.  Will get help right away if you are not doing well or get worse.   This information is not intended to replace advice given to you by your health care provider. Make sure you discuss any questions you have with your health care  provider.   Document Released: 01/30/2001 Document Revised: 03/10/2015 Document Reviewed: 04/04/2013 Elsevier Interactive Patient Education 2016 Elsevier Inc.   Urinary Tract Infection Urinary tract infections (UTIs) can develop anywhere along your urinary tract. Your urinary tract is your body's drainage system for removing wastes and extra water. Your urinary tract includes two kidneys, two ureters, a bladder, and a urethra. Your kidneys are a pair of bean-shaped organs. Each kidney is about the size of your fist. They are located below your ribs, one on each side of your spine. CAUSES Infections are caused by microbes, which are microscopic organisms, including fungi, viruses, and bacteria. These organisms are so small that they can only be seen through a microscope. Bacteria are the microbes that most commonly cause UTIs. SYMPTOMS  Symptoms of UTIs may vary by age and gender of the patient and by the location of the infection. Symptoms in young women typically include a frequent and intense urge to urinate and a painful, burning feeling in the bladder or urethra during urination. Older women and men are more likely to be tired, shaky, and weak and have muscle aches and abdominal pain. A fever may mean the infection is in your kidneys. Other symptoms of a kidney infection include pain in your back or sides below the ribs, nausea, and vomiting. DIAGNOSIS To diagnose a UTI, your caregiver will ask you about your symptoms. Your caregiver will also ask you to provide a urine sample. The urine  sample will be tested for bacteria and white blood cells. White blood cells are made by your body to help fight infection. TREATMENT  Typically, UTIs can be treated with medication. Because most UTIs are caused by a bacterial infection, they usually can be treated with the use of antibiotics. The choice of antibiotic and length of treatment depend on your symptoms and the type of bacteria causing your  infection. HOME CARE INSTRUCTIONS  If you were prescribed antibiotics, take them exactly as your caregiver instructs you. Finish the medication even if you feel better after you have only taken some of the medication.  Drink enough water and fluids to keep your urine clear or pale yellow.  Avoid caffeine, tea, and carbonated beverages. They tend to irritate your bladder.  Empty your bladder often. Avoid holding urine for long periods of time.  Empty your bladder before and after sexual intercourse.  After a bowel movement, women should cleanse from front to back. Use each tissue only once. SEEK MEDICAL CARE IF:   You have back pain.  You develop a fever.  Your symptoms do not begin to resolve within 3 days. SEEK IMMEDIATE MEDICAL CARE IF:   You have severe back pain or lower abdominal pain.  You develop chills.  You have nausea or vomiting.  You have continued burning or discomfort with urination. MAKE SURE YOU:   Understand these instructions.  Will watch your condition.  Will get help right away if you are not doing well or get worse.   This information is not intended to replace advice given to you by your health care provider. Make sure you discuss any questions you have with your health care provider.   Document Released: 08/03/2005 Document Revised: 07/15/2015 Document Reviewed: 12/02/2011 Elsevier Interactive Patient Education [employer].

## 2016-07-13 NOTE — Progress Notes (Signed)
PT Cancellation Note  Patient Details Name: [name] MRN: [id number] DOB: May 16, 1935   Cancelled Treatment:    Reason Eval/Treat Not Completed: Patient declined, no reason specified. Treatment offered this morning; pt currently preparing for discharge and does not feel the need for PT. Complete current PT orders.    Charlaine Dalton, PTA 07/13/2016, 10:32 AM

## 2016-07-13 NOTE — Discharge Summary (Signed)
[facility] NAME: Vincent Poole    MR#:  [id number]  DATE OF BIRTH:  08-09-1935  DATE OF ADMISSION:  07/11/2016 ADMITTING PHYSICIAN: Saundra Shelling, MD  DATE OF DISCHARGE: 07/13/2016 10:30 AM  PRIMARY CARE PHYSICIAN: [name], MD    ADMISSION DIAGNOSIS:  Dysuria [R30.0] Urinary retention [R33.9] Tachycardia [R00.0] UTI (lower urinary tract infection) [N39.0] Sepsis, due to unspecified organism ([place]) [A41.9] Fever, unspecified fever cause [R50.9]  DISCHARGE DIAGNOSIS:  Principal Problem:   Sepsis ([place])   SECONDARY DIAGNOSIS:   Past Medical History:  Diagnosis Date  . Anxiety   . Arthritis   . Prostate cancer [facility])     HOSPITAL COURSE:   1. Clinical sepsis present on admission with fever and tachycardia and leukocytosis. Patient had acute cystitis with hematuria secondary to acute urinary retention. Empiric Rocephin was given. Proteus growing out of urine culture. It was sensitive to Rocephin which was given during the hospital course but patient was switched over to by mouth Cipro upon discharge home because of the MIC being very good. 2. Acute urinary retention and BPH. I started finasteride and Flomax on me seeing him. Foley catheter will remain in at least for 1 week. Nursing staff and family to teach the patient how to drain the urine from the urinary catheter. Schedule urology outpatient appointment for voiding trial. 3. Relative hypotension on presentation. Lisinopril HCT stopped her in the hospital course but can resume as outpatient. 4. Arthritis hold Naprosyn 5. History of prostate cancer. Did follow-up with Dr. Darlys Gales when his practice was in [place]. Patient and family would like to follow up here in [place] instead of driving to [place]. 6. Borderline troponin without chest pain or shortness of breath. Likely demand ischemia from sepsis no further workup. 7. Weakness home health PT and RN set  up   [place]:   Satisfactory  CONSULTS OBTAINED:   None DRUG ALLERGIES:  No Known Allergies  DISCHARGE MEDICATIONS:   Discharge Medication List as of 07/13/2016  9:33 AM    START taking these medications   Details  ciprofloxacin (CIPRO) 500 MG tablet Take 1 tablet (500 mg total) by mouth 2 (two) times daily., Starting Wed 07/13/2016, Print    finasteride (PROSCAR) 5 MG tablet Take 1 tablet (5 mg total) by mouth daily., Starting Wed 07/13/2016, Print    tamsulosin (FLOMAX) 0.4 MG CAPS capsule Take 1 capsule (0.4 mg total) by mouth daily after supper., Starting Wed 07/13/2016, Print      CONTINUE these medications which have NOT CHANGED   Details  lisinopril-hydrochlorothiazide (PRINZIDE,ZESTORETIC) 10-12.5 MG per tablet Take by mouth., Starting Tue 07/03/2012, Historical Med      STOP taking these medications     naproxen sodium (RA NAPROXEN SODIUM) 220 MG tablet          DISCHARGE INSTRUCTIONS:   Follow-up one week PMD. Schedule follow-up appointment with urology for voiding trial.  If you experience worsening of your admission symptoms, develop shortness of breath, life threatening emergency, suicidal or homicidal thoughts you must seek medical attention immediately by calling 911 or calling your MD immediately  if symptoms less severe.  You Must read complete instructions/literature along with all the possible adverse reactions/side effects for all the Medicines you take and that have been prescribed to you. Take any new Medicines after you have completely understood and accept all the possible adverse reactions/side effects.   Please note  You were cared for by a  hospitalist during your hospital stay. If you have any questions about your discharge medications or the care you received while you were in the hospital after you are discharged, you can call the unit and asked to speak with the hospitalist on call if the hospitalist that took care of you is not  available. Once you are discharged, your primary care physician will handle any further medical issues. Please note that NO REFILLS for any discharge medications will be authorized once you are discharged, as it is imperative that you return to your primary care physician (or establish a relationship with a primary care physician if you do not have one) for your aftercare needs so that they can reassess your need for medications and monitor your lab values.    Today   CHIEF COMPLAINT:   Chief Complaint  Patient presents with  . Dysuria  . Urinary Retention    HISTORY OF PRESENT ILLNESS:  Vincent Poole  is a 81 y.o. male presented with difficulty urinating and found to have urinary retention   VITAL SIGNS:  Blood pressure (!) 163/73, pulse (!) 102, temperature 97.7 F (36.5 C), temperature source Oral, resp. rate 20, height 5\' 10"  (1.778 m), weight 93.7 kg (206 lb 8 oz), SpO2 98 %.    PHYSICAL EXAMINATION:  GENERAL:  81 y.o.-year-old patient lying in the bed with no acute distress.  EYES: Pupils equal, round, reactive to light and accommodation. No scleral icterus. Extraocular muscles intact.  HEENT: Head atraumatic, normocephalic. Oropharynx and nasopharynx clear.  NECK:  Supple, no jugular venous distention. No thyroid enlargement, no tenderness.  LUNGS: Normal breath sounds bilaterally, no wheezing, rales,rhonchi or crepitation. No use of accessory muscles of respiration.  CARDIOVASCULAR: S1, S2 normal. No murmurs, rubs, or gallops.  ABDOMEN: Soft, non-tender, non-distended. Bowel sounds present. No organomegaly or mass.  EXTREMITIES: Trace edema, no cyanosis, or clubbing.  NEUROLOGIC: Cranial nerves II through XII are intact. Muscle strength 5/5 in all extremities. Sensation intact. Gait not checked.  PSYCHIATRIC: The patient is alert and oriented x 3.  SKIN: No obvious rash, lesion, or ulcer.   DATA REVIEW:   CBC  Recent Labs Lab 07/12/16 0417  WBC 6.0  HGB 13.0   HCT 37.3*  PLT 131*    Chemistries   Recent Labs Lab 07/11/16 0233  07/12/16 0417  NA 133*  --  138  K 3.2*  --  3.4*  CL 101  --  111  CO2 26  --  21*  GLUCOSE 120*  --  103*  BUN 27*  --  20  CREATININE 1.16  < > 0.86  CALCIUM 8.7*  --  8.1*  AST 20  --   --   ALT 14*  --   --   ALKPHOS 79  --   --   BILITOT 1.3*  --   --   < > = values in this interval not displayed.  Cardiac Enzymes  Recent Labs Lab 07/11/16 0543  TROPONINI 0.20*    Microbiology Results  Results for orders placed or performed during the hospital encounter of 07/11/16  Urine culture     Status: Abnormal   Collection Time: 07/11/16 12:32 AM  Result Value Ref Range Status   Specimen Description URINE, RANDOM  Final   Special Requests NONE  Final   Culture >=100,000 COLONIES/mL PROTEUS MIRABILIS (A)  Final   Report Status 07/13/2016 FINAL  Final   Organism ID, Bacteria PROTEUS MIRABILIS (A)  Final  Susceptibility   Proteus mirabilis - MIC*    AMPICILLIN >=32 RESISTANT Resistant     CEFAZOLIN 8 SENSITIVE Sensitive     CEFTRIAXONE <=1 SENSITIVE Sensitive     CIPROFLOXACIN <=0.25 SENSITIVE Sensitive     GENTAMICIN >=16 RESISTANT Resistant     IMIPENEM 2 SENSITIVE Sensitive     NITROFURANTOIN 128 RESISTANT Resistant     TRIMETH/SULFA <=20 SENSITIVE Sensitive     AMPICILLIN/SULBACTAM >=32 RESISTANT Resistant     PIP/TAZO <=4 SENSITIVE Sensitive     * >=100,000 COLONIES/mL PROTEUS MIRABILIS  Blood Culture (routine x 2)     Status: None (Preliminary result)   Collection Time: 07/11/16  2:33 AM  Result Value Ref Range Status   Specimen Description BLOOD RIGHT ANTECUBITAL  Final   Special Requests BOTTLES DRAWN AEROBIC AND ANAEROBIC 8CC  Final   Culture NO GROWTH 2 DAYS  Final   Report Status PENDING  Incomplete  Blood Culture (routine x 2)     Status: None (Preliminary result)   Collection Time: 07/11/16  2:33 AM  Result Value Ref Range Status   Specimen Description BLOOD BLOOD LEFT  WRIST  Final   Special Requests BOTTLES DRAWN AEROBIC AND ANAEROBIC 8CC  Final   Culture NO GROWTH 2 DAYS  Final   Report Status PENDING  Incomplete    Management plans discussed with the patient, family and they are in agreement.  CODE STATUS:     Code Status Orders        Start     Ordered   07/11/16 0537  Full code  Continuous     07/11/16 0538    Code Status History    Date Active Date Inactive Code Status Order ID Comments User Context   This patient has a current code status but no historical code status.      TOTAL TIME TAKING CARE OF THIS PATIENT: 35 minutes.    Loletha Grayer M.D on 07/13/2016 at 1:10 PM  Between 7am to 6pm - Pager - [phone]  After 6pm go to [email or web address] - password [email or web address] [facility]  [phone]  CC: Primary care physician; [name], MD

## 2016-07-16 LAB — CULTURE, BLOOD (ROUTINE X 2)
CULTURE: NO GROWTH
Culture: NO GROWTH

## 2016-07-21 ENCOUNTER — Ambulatory Visit (INDEPENDENT_AMBULATORY_CARE_PROVIDER_SITE_OTHER): Payer: Medicare Other | Admitting: Urology

## 2016-07-21 ENCOUNTER — Encounter: Payer: Self-pay | Admitting: Urology

## 2016-07-21 VITALS — BP 97/53 | HR 87 | Temp 98.1°F | Ht 71.0 in | Wt 199.3 lb

## 2016-07-21 DIAGNOSIS — N400 Enlarged prostate without lower urinary tract symptoms: Secondary | ICD-10-CM

## 2016-07-21 DIAGNOSIS — Z8546 Personal history of malignant neoplasm of prostate: Secondary | ICD-10-CM | POA: Diagnosis not present

## 2016-07-21 DIAGNOSIS — R339 Retention of urine, unspecified: Secondary | ICD-10-CM

## 2016-07-21 LAB — BLADDER SCAN AMB NON-IMAGING: SCAN RESULT: 68

## 2016-07-21 MED ORDER — TAMSULOSIN HCL 0.4 MG PO CAPS: 0.4000 mg | ORAL_CAPSULE | Freq: Every day | ORAL | 11 refills | Status: DC

## 2016-07-21 MED ORDER — FINASTERIDE 5 MG PO TABS: 5.0000 mg | ORAL_TABLET | Freq: Every day | ORAL | 11 refills | Status: DC

## 2016-07-21 NOTE — Progress Notes (Signed)
07/21/2016 10:24 AM   Vincent Poole 22-Oct-1935 [id number]  Referring provider: [name], MD [facility] [place], [place] [address]  Chief Complaint  Patient presents with  . New Patient (Initial Visit)    urinary retention     HPI: The patient is a 81 year old gentleman who was recently admitted to hospital for sepsis thought to be due from a UTI and urinary retention.    1. Urinary Retention A Foley catheter was placed for an unknown volume. He was started on finasteride and Flomax.   At baseline he did have nocturia 5. Also strain to urinate. This is been going on number of years. He has never been on medication prior to this before.  2. History of Prostate Cancer PSA was 0.05 in February 2017. He follows up annually with Dr. Baruch Gouty.  Per Dr. Dene Gentry note from 2014: Date first diagnosed: (01/2011). TNM Staging - T: T3 Gleason score: 7 (3+4/4+3). PSA results: at diagnosis (29). Treatment components: hormone manipulation and radiation treatment.PSA was 29 and DRE markedly abnormal. Prostate volume was calculated at 77 cc. 6 cores were obtained. Pathology: 5/6 cores were positive for Gleason 3+4 adenocarcinoma involving from 76%-90% of the submitted tissue. The left apex was positive for Gleason 4+3 adenocarcinoma involving 66% of the submitted tissue. IMRT was completed in August 2012. His last Lupron injection was in 07/03/2012.       PMH: Past Medical History:  Diagnosis Date  . Anxiety   . Arthritis   . Prostate cancer [facility])     Surgical History: Past Surgical History:  Procedure Laterality Date  . APPENDECTOMY    . CHOLECYSTECTOMY      Home Medications:    Medication List       Accurate as of 07/21/16 10:24 AM. Always use your most recent med list.          ciprofloxacin 500 MG tablet Commonly known as:  CIPRO Take 1 tablet (500 mg total) by mouth 2 (two) times daily.   finasteride 5 MG tablet Commonly  known as:  PROSCAR Take 1 tablet (5 mg total) by mouth daily.   lisinopril-hydrochlorothiazide 10-12.5 MG tablet Commonly known as:  PRINZIDE,ZESTORETIC Take by mouth.   tamsulosin 0.4 MG Caps capsule Commonly known as:  FLOMAX Take 1 capsule (0.4 mg total) by mouth daily after supper.       Allergies: No Known Allergies  Family History: Family History  Problem Relation Age of Onset  . Cancer Mother   . Chronic Renal Failure Mother   . Heart disease Father     Social History:  reports that he has never smoked. He has never used smokeless tobacco. He reports that he does not drink alcohol or use drugs.  ROS: UROLOGY Frequent Urination?: Yes Hard to postpone urination?: Yes Burning/pain with urination?: Yes Get up at night to urinate?: Yes Leakage of urine?: Yes Urine stream starts and stops?: Yes Trouble starting stream?: Yes Do you have to strain to urinate?: Yes Blood in urine?: Yes Urinary tract infection?: Yes Sexually transmitted disease?: No Injury to kidneys or bladder?: No Painful intercourse?: No Weak stream?: Yes Erection problems?: No Penile pain?: Yes  Gastrointestinal Nausea?: Yes Vomiting?: Yes Indigestion/heartburn?: No Diarrhea?: No Constipation?: No  Constitutional Fever: No Night sweats?: No Weight loss?: No Fatigue?: No  Skin Skin rash/lesions?: No Itching?: No  Eyes Blurred vision?: Yes Double vision?: No  Ears/Nose/Throat Sore throat?: No Sinus problems?: No  Hematologic/Lymphatic Swollen glands?: No Easy  bruising?: No  Cardiovascular Leg swelling?: Yes Chest pain?: No  Respiratory Cough?: Yes Shortness of breath?: No  Endocrine Excessive thirst?: No  Musculoskeletal Back pain?: No Joint pain?: Yes  Neurological Headaches?: No Dizziness?: Yes  Psychologic Depression?: No Anxiety?: No  Physical Exam: BP (!) 97/53   Pulse 87   Temp 98.1 F (36.7 C) (Oral)   Ht 5\' 11"  (1.803 m)   Wt 199 lb 4.8 oz  (90.4 kg)   BMI 27.80 kg/m   Constitutional:  Alert and oriented, No acute distress. HEENT: [place] AT, moist mucus membranes.  Trachea midline, no masses. Cardiovascular: No clubbing, cyanosis, or edema. Respiratory: Normal respiratory effort, no increased work of breathing. GI: Abdomen is soft, nontender, nondistended, no abdominal masses GU: No CVA tenderness. Foley in place. Clear urine Skin: No rashes, bruises or suspicious lesions. Lymph: No cervical or inguinal adenopathy. Neurologic: Grossly intact, no focal deficits, moving all 4 extremities. Psychiatric: Normal mood and affect.  Laboratory Data: Lab Results  Component Value Date   WBC 6.0 07/12/2016   HGB 13.0 07/12/2016   HCT 37.3 (L) 07/12/2016   MCV 91.5 07/12/2016   PLT 131 (L) 07/12/2016    Lab Results  Component Value Date   CREATININE 0.86 07/12/2016    Lab Results  Component Value Date   PSA 0.05 12/24/2015   PSA 0.05 05/27/2015   PSA 0.2 05/23/2012    No results found for: TESTOSTERONE  No results found for: HGBA1C  Urinalysis    Component Value Date/Time   COLORURINE YELLOW (A) 07/11/2016 0032   APPEARANCEUR CLOUDY (A) 07/11/2016 0032   LABSPEC 1.017 07/11/2016 0032   PHURINE 5.0 07/11/2016 0032   GLUCOSEU 50 (A) 07/11/2016 0032   HGBUR 2+ (A) 07/11/2016 0032   BILIRUBINUR NEGATIVE 07/11/2016 0032   KETONESUR TRACE (A) 07/11/2016 0032   PROTEINUR >500 (A) 07/11/2016 0032   NITRITE NEGATIVE 07/11/2016 0032   LEUKOCYTESUR 3+ (A) 07/11/2016 0032      Assessment & Plan:    1. BPH with urinary retention Foley discontinued today. Patient only tolerated 100 cc placed in his bladder. He voided half of this. We'll have him come back this afternoon for a PVR. If normal, follow up in one month  2. History of prostate cancer Follow-up as scheduled in February 2018 with Dr. Baruch Gouty for repeat PSA.  Return in about 4 weeks (around 08/18/2016).  Nickie Retort, MD  [facility] [address] [place], [place] [address] [phone]

## 2016-07-21 NOTE — Progress Notes (Signed)
  Fill and Pull Catheter Removal  Patient is present today for a catheter removal.  Patient was cleaned and prepped in a sterile fashion 140 ml of sterile water/ saline was instilled into the bladder when the patient felt the urge to urinate. 8 ml of water was then drained from the balloon.  A 16 FR foley cath was removed from the bladder no complications were noted .  Patient as then given some time to void on their own.  Patient can void  70 ml on their own after some time.  Patient tolerated well, but because he was only able to void half of the amount that was instilled we asked him to return this afternoon for a PVR.   Preformed by: Leanna Battles

## 2016-08-01 DIAGNOSIS — Z0000 Encounter for general adult medical examination without abnormal findings: Secondary | ICD-10-CM | POA: Insufficient documentation

## 2016-08-18 ENCOUNTER — Ambulatory Visit: Payer: Medicare Other

## 2016-08-30 ENCOUNTER — Ambulatory Visit: Payer: Medicare Other | Admitting: Urology

## 2016-08-30 ENCOUNTER — Encounter: Payer: Self-pay | Admitting: Urology

## 2016-08-30 VITALS — BP 100/65 | HR 105 | Ht 71.0 in | Wt 194.2 lb

## 2016-08-30 DIAGNOSIS — C61 Malignant neoplasm of prostate: Secondary | ICD-10-CM | POA: Diagnosis not present

## 2016-08-30 DIAGNOSIS — R351 Nocturia: Secondary | ICD-10-CM

## 2016-08-30 DIAGNOSIS — R339 Retention of urine, unspecified: Secondary | ICD-10-CM | POA: Diagnosis not present

## 2016-08-30 DIAGNOSIS — N401 Enlarged prostate with lower urinary tract symptoms: Secondary | ICD-10-CM

## 2016-08-30 DIAGNOSIS — I10 Essential (primary) hypertension: Secondary | ICD-10-CM | POA: Insufficient documentation

## 2016-08-30 DIAGNOSIS — Z8619 Personal history of other infectious and parasitic diseases: Secondary | ICD-10-CM | POA: Insufficient documentation

## 2016-08-30 LAB — BLADDER SCAN AMB NON-IMAGING: SCAN RESULT: 0

## 2016-08-30 MED ORDER — FINASTERIDE 5 MG PO TABS: 5.0000 mg | ORAL_TABLET | Freq: Every day | ORAL | 3 refills | Status: DC

## 2016-08-30 MED ORDER — FINASTERIDE 5 MG PO TABS: 5.0000 mg | ORAL_TABLET | Freq: Every day | ORAL | 0 refills | Status: DC

## 2016-08-30 MED ORDER — TAMSULOSIN HCL 0.4 MG PO CAPS: 0.4000 mg | ORAL_CAPSULE | Freq: Every day | ORAL | 3 refills | Status: DC

## 2016-08-30 MED ORDER — TAMSULOSIN HCL 0.4 MG PO CAPS: 0.4000 mg | ORAL_CAPSULE | Freq: Every day | ORAL | 11 refills | Status: DC

## 2016-08-30 NOTE — Progress Notes (Signed)
08/30/2016 3:16 PM   Vincent Poole [birth year] [id number]  Referring provider: [name], MD [facility] [place], [place] [address]  Chief Complaint  Patient presents with  . Follow-up    BPH    HPI:  Vincent Poole is a 81yo with the following issues:  1. Urinary Retention A Foley catheter was placed for an unknown volume. He was started on finasteride and Flomax.   At baseline he did have nocturia 5. Also strain to urinate. This is been going on number of years. He has never been on medication prior to this before.  2. History of Prostate Cancer PSA was 0.05 in February 2017. He follows up annually with Vincent Poole.  Per Vincent Poole note from 2014: Date first diagnosed: (01/2011). TNM Staging - T: T3 Gleason score: 7 (3+4/4+3). PSA results: at diagnosis (29). Treatment components: hormone manipulation and radiation treatment.PSA was 29 and DRE markedly abnormal. Prostate volume was calculated at 77 cc. 6 cores were obtained. Pathology: 5/6 cores were positive for Gleason 3+4 adenocarcinoma involving from 76%-90% of the submitted tissue. The left apex was positive for Gleason 4+3 adenocarcinoma involving 66% of the submitted tissue. IMRT was completed in August 2012. His last Lupron injection was in 07/03/2012.    Interval hx: He was seen 1 month ago for retention and passed his voiding trial. IPSS today is 6 with a bother of 1. He is currently on flomax and finasteride. He has nocturia 3x. He tries not to drink within 2 hours of bedtime. No caffeine after 3pm. Mild lower extremity edema.    PMH: Past Medical History:  Diagnosis Date  . Anxiety   . Arthritis   . Prostate cancer [facility])     Surgical History: Past Surgical History:  Procedure Laterality Date  . APPENDECTOMY    . CHOLECYSTECTOMY      Home Medications:    Medication List       Accurate as of 08/30/16  3:16 PM. Always use your most recent med list.            ciprofloxacin 500 MG tablet Commonly known as:  CIPRO Take 1 tablet (500 mg total) by mouth 2 (two) times daily.   finasteride 5 MG tablet Commonly known as:  PROSCAR Take 1 tablet (5 mg total) by mouth daily.   finasteride 5 MG tablet Commonly known as:  PROSCAR Take 1 tablet (5 mg total) by mouth daily.   lisinopril-hydrochlorothiazide 10-12.5 MG tablet Commonly known as:  PRINZIDE,ZESTORETIC Take by mouth.   tamsulosin 0.4 MG Caps capsule Commonly known as:  FLOMAX Take 1 capsule (0.4 mg total) by mouth daily after supper.   tamsulosin 0.4 MG Caps capsule Commonly known as:  FLOMAX Take 1 capsule (0.4 mg total) by mouth daily.       Allergies: No Known Allergies  Family History: Family History  Problem Relation Age of Onset  . Cancer Mother   . Chronic Renal Failure Mother   . Heart disease Father     Social History:  reports that he has never smoked. He has never used smokeless tobacco. He reports that he does not drink alcohol or use drugs.  ROS: UROLOGY Frequent Urination?: No Hard to postpone urination?: No Burning/pain with urination?: No Get up at night to urinate?: Yes Leakage of urine?: No Urine stream starts and stops?: No Trouble starting stream?: No Do you have to strain to urinate?: No Blood in urine?: No Urinary tract infection?: No  Sexually transmitted disease?: No Injury to kidneys or bladder?: No Painful intercourse?: No Weak stream?: No Erection problems?: No Penile pain?: No  Gastrointestinal Nausea?: Yes Vomiting?: No Indigestion/heartburn?: No Diarrhea?: No Constipation?: No  Constitutional Fever: No Night sweats?: No Weight loss?: No Fatigue?: No  Skin Skin rash/lesions?: No Itching?: No  Eyes Blurred vision?: No Double vision?: No  Ears/Nose/Throat Sore throat?: No Sinus problems?: No  Hematologic/Lymphatic Swollen glands?: No Easy bruising?: No  Cardiovascular Leg swelling?: Yes Chest pain?:  No  Respiratory Cough?: No Shortness of breath?: No  Endocrine Excessive thirst?: No  Musculoskeletal Back pain?: No Joint pain?: No  Neurological Headaches?: No Dizziness?: Yes  Psychologic Depression?: No Anxiety?: No  Physical Exam: BP 100/65 (BP Location: Left Arm, Patient Position: Sitting, Cuff Size: Normal)   Pulse (!) 105   Ht 5\' 11"  (1.803 m)   Wt 88.1 kg (194 lb 3.2 oz)   BMI 27.09 kg/m   Constitutional:  Alert and oriented, No acute distress. HEENT: [place] AT, moist mucus membranes.  Trachea midline, no masses. Cardiovascular: No clubbing, cyanosis, or edema. Respiratory: Normal respiratory effort, no increased work of breathing. GI: Abdomen is soft, nontender, nondistended, no abdominal masses GU: No CVA tenderness.  Skin: No rashes, bruises or suspicious lesions. Lymph: No cervical or inguinal adenopathy. Neurologic: Grossly intact, no focal deficits, moving all 4 extremities. Psychiatric: Normal mood and affect.  Laboratory Data: Lab Results  Component Value Date   WBC 6.0 07/12/2016   HGB 13.0 07/12/2016   HCT 37.3 (L) 07/12/2016   MCV 91.5 07/12/2016   PLT 131 (L) 07/12/2016    Lab Results  Component Value Date   CREATININE 0.86 07/12/2016    Lab Results  Component Value Date   PSA 0.05 12/24/2015   PSA 0.05 05/27/2015   PSA 0.2 05/23/2012    No results found for: TESTOSTERONE  No results found for: HGBA1C  Urinalysis    Component Value Date/Time   COLORURINE YELLOW (A) 07/11/2016 0032   APPEARANCEUR CLOUDY (A) 07/11/2016 0032   LABSPEC 1.017 07/11/2016 0032   PHURINE 5.0 07/11/2016 0032   GLUCOSEU 50 (A) 07/11/2016 0032   HGBUR 2+ (A) 07/11/2016 0032   BILIRUBINUR NEGATIVE 07/11/2016 0032   KETONESUR TRACE (A) 07/11/2016 0032   PROTEINUR >500 (A) 07/11/2016 0032   NITRITE NEGATIVE 07/11/2016 0032   LEUKOCYTESUR 3+ (A) 07/11/2016 0032    Pertinent Imaging:   Assessment & Plan:    1. Urinary retention, BPH with  LUTS Continue flomax and finasteride  2. Prostate cancer -PSA in 3 months - Bladder Scan (Post Void Residual) in office   No Follow-up on file.  Vincent Bang, MD  [facility] [address] [place], [place] [address] [phone]

## 2016-11-28 ENCOUNTER — Encounter: Payer: Self-pay | Admitting: Urology

## 2016-11-28 ENCOUNTER — Ambulatory Visit: Payer: Medicare Other | Admitting: Urology

## 2016-11-28 VITALS — BP 126/69 | HR 98 | Ht 71.0 in | Wt 200.7 lb

## 2016-11-28 DIAGNOSIS — R339 Retention of urine, unspecified: Secondary | ICD-10-CM

## 2016-11-28 LAB — BLADDER SCAN AMB NON-IMAGING: Scan Result: 16

## 2016-11-28 MED ORDER — FINASTERIDE 5 MG PO TABS: 5.0000 mg | ORAL_TABLET | Freq: Every day | ORAL | 3 refills | Status: DC

## 2016-11-28 MED ORDER — TAMSULOSIN HCL 0.4 MG PO CAPS
0.4000 mg | ORAL_CAPSULE | Freq: Every day | ORAL | 3 refills | Status: DC
Start: 2016-11-28 — End: 2017-06-19

## 2016-11-28 NOTE — Addendum Note (Signed)
Addended by: Lestine Box on: 11/28/2016 03:20 PM   Modules accepted: Orders

## 2016-11-28 NOTE — Progress Notes (Signed)
11/28/2016 9:13 AM   Vincent Poole 04/28/1935 [id number]  Referring provider: [name], MD [facility] [address] [place], [place] [address]  Chief Complaint  Patient presents with  . Urinary Retention  . Follow-up    HPI: 82 year old male who is seen today as follow-up from a recent hospitalization where he is seen for a Klebsiella urinary tract infection and associated urinary retention. The patient was discharged home on ciprofloxacin times 7 days as well as a Foley catheter. He was also started on tamsulosin and finasteride.  The patient subsequently underwent a voiding trial and has been now voiding on his own for the past few months. His current PVR is 16.   Prior to starting the finasteride and Flomax, the patient was getting up 5 times at night to urinate and straining to void. The patient continues to have nocturia times 3 with daytime urinary frequency. His symptoms are unchanged over the course of the last 3 months. He denies any gross hematuria. His stream is intermittently strong. He is not straining to void quite as much. He is otherwise satisfied with the symptoms.     The patient has a past GU history significant for Gleason 4+ 3=7 prostate cancer, pretreatment PSA 29, and an abnormal rectal exam for which he was treated with external beam radiation therapy in 2012. He received androgen deprivation therapy with his last injection on 07/03/12.  His most recent PSA as documented in the EMR is on 12/24/15, 0.05.   PMH: Past Medical History:  Diagnosis Date  . Anxiety   . Arthritis   . Prostate cancer [facility])     Surgical History: Past Surgical History:  Procedure Laterality Date  . APPENDECTOMY    . CHOLECYSTECTOMY      Home Medications:  Allergies as of 11/28/2016   No Known Allergies     Medication List       Accurate as of 11/28/16  9:13 AM. Always use your most recent med list.          finasteride 5 MG tablet Commonly  known as:  PROSCAR Take 1 tablet (5 mg total) by mouth daily.   lisinopril-hydrochlorothiazide 10-12.5 MG tablet Commonly known as:  PRINZIDE,ZESTORETIC Take by mouth.   tamsulosin 0.4 MG Caps capsule Commonly known as:  FLOMAX Take 1 capsule (0.4 mg total) by mouth daily after supper.       Allergies: No Known Allergies  Family History: Family History  Problem Relation Age of Onset  . Cancer Mother   . Chronic Renal Failure Mother   . Heart disease Father     Social History:  reports that he has never smoked. He has never used smokeless tobacco. He reports that he does not drink alcohol or use drugs.  ROS: UROLOGY Frequent Urination?: No Hard to postpone urination?: No Burning/pain with urination?: No Get up at night to urinate?: Yes Leakage of urine?: No Urine stream starts and stops?: No Trouble starting stream?: No Do you have to strain to urinate?: No Blood in urine?: No Urinary tract infection?: No Sexually transmitted disease?: No Injury to kidneys or bladder?: No Painful intercourse?: No Weak stream?: No Erection problems?: No Penile pain?: No  Gastrointestinal Nausea?: No Vomiting?: No Indigestion/heartburn?: No Diarrhea?: No Constipation?: No  Constitutional Fever: No Night sweats?: No Weight loss?: No Fatigue?: No  Skin Skin rash/lesions?: No Itching?: No  Eyes Blurred vision?: Yes Double vision?: No  Ears/Nose/Throat Sore throat?: No Sinus problems?: No  Hematologic/Lymphatic  Swollen glands?: No Easy bruising?: No  Cardiovascular Leg swelling?: No Chest pain?: No  Respiratory Cough?: No Shortness of breath?: No  Endocrine Excessive thirst?: No  Musculoskeletal Back pain?: No Joint pain?: No  Neurological Headaches?: No Dizziness?: Yes  Psychologic Depression?: No Anxiety?: No  Physical Exam: BP 126/69   Pulse 98   Ht 5\' 11"  (1.803 m)   Wt 91 kg (200 lb 11.2 oz)   BMI 27.99 kg/m   Constitutional:  Alert  and oriented, No acute distress.   Laboratory Data: Lab Results  Component Value Date   WBC 6.0 07/12/2016   HGB 13.0 07/12/2016   HCT 37.3 (L) 07/12/2016   MCV 91.5 07/12/2016   PLT 131 (L) 07/12/2016    Lab Results  Component Value Date   CREATININE 0.86 07/12/2016    Lab Results  Component Value Date   PSA 0.05 12/24/2015   PSA 0.05 05/27/2015   PSA 0.2 05/23/2012    No results found for: TESTOSTERONE  No results found for: HGBA1C  Urinalysis    Component Value Date/Time   COLORURINE YELLOW (A) 07/11/2016 0032   APPEARANCEUR CLOUDY (A) 07/11/2016 0032   LABSPEC 1.017 07/11/2016 0032   PHURINE 5.0 07/11/2016 0032   GLUCOSEU 50 (A) 07/11/2016 0032   HGBUR 2+ (A) 07/11/2016 0032   BILIRUBINUR NEGATIVE 07/11/2016 0032   KETONESUR TRACE (A) 07/11/2016 0032   PROTEINUR >500 (A) 07/11/2016 0032   NITRITE NEGATIVE 07/11/2016 0032   LEUKOCYTESUR 3+ (A) 07/11/2016 0032    Pertinent Imaging: None  Assessment & Plan:  The patient has a urologic history significant for high-grade prostate cancer for which he was treated with hormones and radiation therapy in 2012. He is now being followed by Dr. Donella Stade at the [facility] for his prostate cancer. He is on an annual follow-up schedule. His PSA is due next month followed by an appointment with Dr. Donella Stade. Previous PSAs have been very low and reassuring. I encouraged the patient to continue to follow-up with Dr. Donella Stade for his prostate cancer.  The patient's voiding symptoms on finasteride and [place] a low-dose and are stable. The patient feels as if the medication is impacting his symptoms quite a bit and would like to continue both. Given his stability, we will plan to follow-up in one year.  We will plan to follow-up with the patient in 1 year, but sooner if necessary.  Cc: Dr. Theo Dills, MD There are no diagnoses linked to this encounter.  No Follow-up on file.  Ardis Hughs,  [facility] [address], [place], [place] [address] [phone]

## 2016-12-14 ENCOUNTER — Other Ambulatory Visit: Payer: Self-pay | Admitting: *Deleted

## 2016-12-22 ENCOUNTER — Ambulatory Visit
Admission: RE | Admit: 2016-12-22 | Discharge: 2016-12-22 | Disposition: A | Discharge status: Home / Self Care | Payer: Medicare Other | Source: Ambulatory Visit | Attending: Radiation Oncology | Admitting: Radiation Oncology

## 2016-12-22 ENCOUNTER — Encounter: Payer: Self-pay | Admitting: Radiation Oncology

## 2016-12-22 ENCOUNTER — Other Ambulatory Visit: Payer: Self-pay | Admitting: *Deleted

## 2016-12-22 ENCOUNTER — Inpatient Hospital Stay: Payer: Medicare Other | Attending: Radiation Oncology

## 2016-12-22 VITALS — BP 130/74 | HR 90 | Temp 96.5°F | Resp 18 | Ht 71.0 in | Wt 199.7 lb

## 2016-12-22 DIAGNOSIS — C61 Malignant neoplasm of prostate: Secondary | ICD-10-CM | POA: Diagnosis not present

## 2016-12-22 DIAGNOSIS — Z923 Personal history of irradiation: Secondary | ICD-10-CM | POA: Insufficient documentation

## 2016-12-22 DIAGNOSIS — Z8546 Personal history of malignant neoplasm of prostate: Secondary | ICD-10-CM | POA: Insufficient documentation

## 2016-12-22 LAB — PSA: PSA: 0.02 ng/mL (ref 0.00–4.00)

## 2016-12-22 NOTE — Progress Notes (Signed)
Radiation Oncology Follow up Note  Name: Vincent Poole   Date:   12/22/2016 MRN:  [id number] DOB: 05-20-1935    This 82 y.o. male presents to the clinic today for 7-1/2 year follow-up for stage IIB Gleason 7 adenocarcinoma the prostate  REFERRING PROVIDER: Melynda Ripple, MD  HPI: patient is a 82 year old male now out 7-1/2 years having completed IM RT to his prostate and pelvic nodes for Gleason 7 adenocarcinoma the prostate presenting the PSA of 29. He is seen today in routine follow-up and is doing well. He specifically denies any diarrhea dysuria or any other GI/GU complaints..his PSAs have been less than 0.1. I performed another PSA level on him today.  COMPLICATIONS OF TREATMENT: none  FOLLOW UP COMPLIANCE: keeps appointments   PHYSICAL EXAM:  BP 130/74 (BP Location: Left Arm, Patient Position: Sitting)   Pulse 90   Temp (!) 96.5 F (35.8 C) (Tympanic)   Resp 18   Ht 5\' 11"  (1.803 m)   Wt 199 lb 11.8 oz (90.6 kg)   BMI 27.86 kg/m  On rectal exam rectal sphincter tone is good. Prostate is smooth contracted without evidence of nodularity or mass. Sulcus is preserved bilaterally. No discrete nodularity is identified. No other rectal abnormalities are noted. Well-developed well-nourished patient in NAD. HEENT reveals PERLA, EOMI, discs not visualized.  Oral cavity is clear. No oral mucosal lesions are identified. Neck is clear without evidence of cervical or supraclavicular adenopathy. Lungs are clear to A&P. Cardiac examination is essentially unremarkable with regular rate and rhythm without murmur rub or thrill. Abdomen is benign with no organomegaly or masses noted. Motor sensory and DTR levels are equal and symmetric in the upper and lower extremities. Cranial nerves II through XII are grossly intact. Proprioception is intact. No peripheral adenopathy or edema is identified. No motor or sensory levels are noted. Crude visual fields are within normal range.  RADIOLOGY RESULTS: no  current films for review  PLAN: at the present time patient is doing well I have run a PSA level on him today and report that separately. Otherwise I've asked to see him back in 1 year for follow-up and will obtain a PSA prior to that visit. Thick continues to remain in the 0.1 range will discontinue follow-up care. Patient is aware of my treatment plan. He knows to call at anytime with any concerns.  I would like to take this opportunity to thank you for allowing me to participate in the care of your patient.Armstead Peaks., MD

## 2016-12-22 NOTE — Progress Notes (Signed)
Pt reports he is getting up at night every 2-3 hours to urinate. No other problems reported this visit.

## 2017-03-04 ENCOUNTER — Inpatient Hospital Stay
Admission: EM | Admit: 2017-03-04 | Discharge: 2017-03-05 | DRG: 872 | Disposition: A | Discharge status: Home / Self Care | Payer: Medicare Other | Attending: Internal Medicine | Admitting: Internal Medicine

## 2017-03-04 ENCOUNTER — Encounter: Payer: Self-pay | Admitting: Emergency Medicine

## 2017-03-04 DIAGNOSIS — I10 Essential (primary) hypertension: Secondary | ICD-10-CM | POA: Diagnosis present

## 2017-03-04 DIAGNOSIS — R339 Retention of urine, unspecified: Secondary | ICD-10-CM | POA: Diagnosis present

## 2017-03-04 DIAGNOSIS — N138 Other obstructive and reflux uropathy: Secondary | ICD-10-CM | POA: Diagnosis present

## 2017-03-04 DIAGNOSIS — A419 Sepsis, unspecified organism: Secondary | ICD-10-CM

## 2017-03-04 DIAGNOSIS — Z79899 Other long term (current) drug therapy: Secondary | ICD-10-CM | POA: Diagnosis not present

## 2017-03-04 DIAGNOSIS — G8929 Other chronic pain: Secondary | ICD-10-CM | POA: Diagnosis present

## 2017-03-04 DIAGNOSIS — C61 Malignant neoplasm of prostate: Secondary | ICD-10-CM | POA: Diagnosis not present

## 2017-03-04 DIAGNOSIS — F419 Anxiety disorder, unspecified: Secondary | ICD-10-CM | POA: Diagnosis not present

## 2017-03-04 DIAGNOSIS — M545 Low back pain: Secondary | ICD-10-CM | POA: Diagnosis present

## 2017-03-04 DIAGNOSIS — N390 Urinary tract infection, site not specified: Secondary | ICD-10-CM

## 2017-03-04 DIAGNOSIS — N3001 Acute cystitis with hematuria: Secondary | ICD-10-CM

## 2017-03-04 DIAGNOSIS — N139 Obstructive and reflux uropathy, unspecified: Secondary | ICD-10-CM | POA: Diagnosis not present

## 2017-03-04 DIAGNOSIS — N401 Enlarged prostate with lower urinary tract symptoms: Secondary | ICD-10-CM | POA: Diagnosis not present

## 2017-03-04 DIAGNOSIS — Z8546 Personal history of malignant neoplasm of prostate: Secondary | ICD-10-CM

## 2017-03-04 DIAGNOSIS — H9190 Unspecified hearing loss, unspecified ear: Secondary | ICD-10-CM | POA: Diagnosis not present

## 2017-03-04 LAB — BASIC METABOLIC PANEL
Anion gap: 12 (ref 5–15)
BUN: 20 mg/dL (ref 6–20)
CHLORIDE: 105 mmol/L (ref 101–111)
CO2: 20 mmol/L — AB (ref 22–32)
Calcium: 9.5 mg/dL (ref 8.9–10.3)
Creatinine, Ser: 1 mg/dL (ref 0.61–1.24)
GFR calc Af Amer: >60 mL/min (ref >60)
GFR calc non Af Amer: >60 mL/min (ref >60)
GLUCOSE: 178 mg/dL — AB (ref 65–99)
POTASSIUM: 3.5 mmol/L (ref 3.5–5.1)
SODIUM: 137 mmol/L (ref 135–145)

## 2017-03-04 LAB — SURGICAL PCR SCREEN
MRSA, PCR: NEGATIVE
STAPHYLOCOCCUS AUREUS: POSITIVE — AB

## 2017-03-04 LAB — CBC
HCT: 43.5 % (ref 40.0–52.0)
HEMOGLOBIN: 14.7 g/dL (ref 13.0–18.0)
MCH: 30.1 pg (ref 26.0–34.0)
MCHC: 33.7 g/dL (ref 32.0–36.0)
MCV: 89.3 fL (ref 80.0–100.0)
Platelets: 274 10*3/uL (ref 150–440)
RBC: 4.87 MIL/uL (ref 4.40–5.90)
RDW: 14.4 % (ref 11.5–14.5)
WBC: 12.2 10*3/uL — ABNORMAL HIGH (ref 3.8–10.6)

## 2017-03-04 LAB — URINALYSIS, COMPLETE (UACMP) WITH MICROSCOPIC
Bilirubin Urine: NEGATIVE
Glucose, UA: NEGATIVE mg/dL
KETONES UR: NEGATIVE mg/dL
Nitrite: NEGATIVE
PROTEIN: 100 mg/dL — AB
SPECIFIC GRAVITY, URINE: 1.015 (ref 1.005–1.030)
SQUAMOUS EPITHELIAL / LPF: NONE SEEN
pH: 5 (ref 5.0–8.0)

## 2017-03-04 LAB — LACTIC ACID, PLASMA
LACTIC ACID, VENOUS: 2.9 mmol/L — AB (ref 0.5–1.9)
Lactic Acid, Venous: 1.6 mmol/L (ref 0.5–1.9)

## 2017-03-04 MED ORDER — HYDROCHLOROTHIAZIDE 12.5 MG PO CAPS: 12.5000 mg | ORAL_CAPSULE | Freq: Every day | ORAL | Status: DC

## 2017-03-04 MED ORDER — BISACODYL 10 MG RE SUPP
10.0000 mg | Freq: Every day | RECTAL | Status: DC | PRN
Start: 2017-03-04 — End: 2017-03-05

## 2017-03-04 MED ORDER — DEXTROSE 5 % IV SOLN
2.0000 g | Freq: Once | INTRAVENOUS | Status: AC
  Administered 2017-03-04: 2 g via INTRAVENOUS
  Filled 2017-03-04: qty 2

## 2017-03-04 MED ORDER — OXYCODONE-ACETAMINOPHEN 5-325 MG PO TABS
ORAL_TABLET | ORAL | Status: AC
  Filled 2017-03-04: qty 1

## 2017-03-04 MED ORDER — ACETAMINOPHEN 325 MG PO TABS: 650.0000 mg | ORAL_TABLET | Freq: Four times a day (QID) | ORAL | Status: DC | PRN

## 2017-03-04 MED ORDER — SODIUM CHLORIDE 0.9% FLUSH
3.0000 mL | Freq: Two times a day (BID) | INTRAVENOUS | Status: DC
  Administered 2017-03-04: 3 mL via INTRAVENOUS

## 2017-03-04 MED ORDER — LISINOPRIL 5 MG PO TABS: 5.0000 mg | ORAL_TABLET | Freq: Every day | ORAL | Status: DC

## 2017-03-04 MED ORDER — TAMSULOSIN HCL 0.4 MG PO CAPS: 0.4000 mg | ORAL_CAPSULE | Freq: Every day | ORAL | Status: DC

## 2017-03-04 MED ORDER — ONDANSETRON HCL 4 MG PO TABS: 4.0000 mg | ORAL_TABLET | Freq: Four times a day (QID) | ORAL | Status: DC | PRN

## 2017-03-04 MED ORDER — POTASSIUM CHLORIDE IN NACL 20-0.9 MEQ/L-% IV SOLN
INTRAVENOUS | Status: DC
  Administered 2017-03-04: 19:00:00 via INTRAVENOUS
  Filled 2017-03-04 (×4): qty 1000

## 2017-03-04 MED ORDER — ACETAMINOPHEN 650 MG RE SUPP: 650.0000 mg | Freq: Four times a day (QID) | RECTAL | Status: DC | PRN

## 2017-03-04 MED ORDER — LISINOPRIL-HYDROCHLOROTHIAZIDE 10-12.5 MG PO TABS: 0.5000 | ORAL_TABLET | Freq: Every day | ORAL | Status: DC

## 2017-03-04 MED ORDER — HYDROCHLOROTHIAZIDE 10 MG/ML ORAL SUSPENSION
6.2500 mg | Freq: Every day | ORAL | Status: DC
  Filled 2017-03-04: qty 1.25

## 2017-03-04 MED ORDER — FAMOTIDINE IN NACL 20-0.9 MG/50ML-% IV SOLN
20.0000 mg | Freq: Two times a day (BID) | INTRAVENOUS | Status: DC
  Administered 2017-03-04: 20 mg via INTRAVENOUS
  Filled 2017-03-04 (×5): qty 50

## 2017-03-04 MED ORDER — MORPHINE SULFATE (PF) 2 MG/ML IV SOLN: 2.0000 mg | INTRAVENOUS | Status: DC | PRN

## 2017-03-04 MED ORDER — OXYCODONE-ACETAMINOPHEN 5-325 MG PO TABS
1.0000 | ORAL_TABLET | ORAL | Status: AC | PRN
Start: 2017-03-04 — End: 2017-03-04
  Administered 2017-03-04 (×2): 1 via ORAL
  Filled 2017-03-04: qty 1

## 2017-03-04 MED ORDER — CEFEPIME-DEXTROSE 1 GM/50ML IV SOLR
1.0000 g | Freq: Two times a day (BID) | INTRAVENOUS | Status: DC
  Administered 2017-03-04: 1 g via INTRAVENOUS
  Filled 2017-03-04 (×3): qty 50

## 2017-03-04 MED ORDER — CEFEPIME-DEXTROSE 1 GM/50ML IV SOLR
INTRAVENOUS | Status: AC
  Filled 2017-03-04: qty 50

## 2017-03-04 MED ORDER — ONDANSETRON HCL 4 MG/2ML IJ SOLN: 4.0000 mg | Freq: Four times a day (QID) | INTRAMUSCULAR | Status: DC | PRN

## 2017-03-04 MED ORDER — SODIUM CHLORIDE 0.9 % IV BOLUS (SEPSIS)
1000.0000 mL | Freq: Once | INTRAVENOUS | Status: AC
  Administered 2017-03-04: 1000 mL via INTRAVENOUS

## 2017-03-04 MED ORDER — FINASTERIDE 5 MG PO TABS: 5.0000 mg | ORAL_TABLET | Freq: Every day | ORAL | Status: DC

## 2017-03-04 MED ORDER — HEPARIN SODIUM (PORCINE) 5000 UNIT/ML IJ SOLN
5000.0000 [IU] | Freq: Three times a day (TID) | INTRAMUSCULAR | Status: DC
  Administered 2017-03-04 – 2017-03-05 (×2): 5000 [IU] via SUBCUTANEOUS
  Filled 2017-03-04: qty 1

## 2017-03-04 MED ORDER — DOCUSATE SODIUM 100 MG PO CAPS
100.0000 mg | ORAL_CAPSULE | Freq: Two times a day (BID) | ORAL | Status: DC
  Administered 2017-03-04: 100 mg via ORAL
  Filled 2017-03-04: qty 1

## 2017-03-04 MED ORDER — LISINOPRIL 10 MG PO TABS
10.0000 mg | ORAL_TABLET | Freq: Every day | ORAL | Status: DC
Start: 2017-03-05 — End: 2017-03-04

## 2017-03-04 MED ORDER — SODIUM CHLORIDE 0.9 % IV BOLUS (SEPSIS): 500.0000 mL | Freq: Once | INTRAVENOUS | Status: DC

## 2017-03-04 NOTE — H&P (Signed)
History and Physical    Vincent Poole TMH:[id number] DOB: 06-11-1935 DOA: 03/04/2017  Referring physician: Dr. Joni Fears PCP: [name], MD  Specialists: Dr. Pilar Jarvis  Chief Complaint: abdominal pain with inability to urinate  HPI: Vincent Poole is a 82 y.o. male has a past medical history significant for prostate cancer now with fever, lower abdominal pain, and urinary retention. In ER, pt was tachycardic with elevated lactic acid and leukocytosis c/w sepsis. UA grossly abnormal. Pt has hx of same. He is now admitted. Denies CP or SOB. No N/V/D.  Review of Systems: The patient denies anorexia,  weight loss,, vision loss, decreased hearing, hoarseness, chest pain, syncope, dyspnea on exertion, peripheral edema, balance deficits, hemoptysis, melena, hematochezia, severe indigestion/heartburn, hematuria, incontinence, genital sores, muscle weakness, suspicious skin lesions, transient blindness, difficulty walking, depression, unusual weight change, abnormal bleeding, enlarged lymph nodes, angioedema, and breast masses.   Past Medical History:  Diagnosis Date  . Anxiety   . Arthritis   . Prostate cancer [facility])    Past Surgical History:  Procedure Laterality Date  . APPENDECTOMY    . CHOLECYSTECTOMY     Social History:  reports that he has never smoked. He has never used smokeless tobacco. He reports that he does not drink alcohol or use drugs.  No Known Allergies  Family History  Problem Relation Age of Onset  . Cancer Mother   . Chronic Renal Failure Mother   . Heart disease Father     Prior to Admission medications   Medication Sig Start Date End Date Taking? Authorizing Provider  acetaminophen (TYLENOL) 500 MG tablet Take 500 mg by mouth every 6 (six) hours as needed.   Yes Historical Provider, MD  finasteride (PROSCAR) 5 MG tablet Take 1 tablet (5 mg total) by mouth daily. 11/28/16  Yes Ardis Hughs, MD  lisinopril-hydrochlorothiazide (PRINZIDE,ZESTORETIC)  10-12.5 MG per tablet Take 0.5 tablets by mouth daily.  07/03/12  Yes Historical Provider, MD  tamsulosin (FLOMAX) 0.4 MG CAPS capsule Take 1 capsule (0.4 mg total) by mouth daily after supper. 11/28/16  Yes Ardis Hughs, MD   Physical Exam: Vitals:   03/04/17 1346 03/04/17 1545 03/04/17 1600 03/04/17 1615  BP: (!) 158/99 123/75 126/74 136/74  Pulse: (!) 108 (!) 103 98 93  Resp: 20 16 (!) 21 (!) 22  Temp: 97.7 F (36.5 C)     TempSrc: Oral     SpO2: 98% 92% 94% 95%  Weight: 90.3 kg (199 lb)     Height: 5\' 9"  (1.753 m)        General:  No apparent distress, [place]/AT, WDWN  Eyes: PERRL, EOMI, no scleral icterus, conjunctiva clear  ENT: moist oropharynx without exudate, TM's benign, dentition good  Neck: supple, no lymphadenopathy. No bruits or thyromegaly  Cardiovascular: rapid rate with regular rhythm without MRG; 2+ peripheral pulses, no JVD, no peripheral edema  Respiratory: CTA biL, good air movement without wheezing, rhonchi or crackled. Respiratory effort normal  Abdomen: soft,  tender to palpation in the lower quadrants, positive bowel sounds, no guarding, no rebound  Skin: no rashes or lesions  Musculoskeletal: normal bulk and tone, no joint swelling  Psychiatric: normal mood and affect, A&OX3  Neurologic: CN 2-12 grossly intact, Motor strength 5/5 in all 4 groups with symmetric DTR's and non-focal sensory exam  Labs on Admission:  Basic Metabolic Panel:  Recent Labs Lab 03/04/17 1347  NA 137  K 3.5  CL 105  CO2 20*  GLUCOSE 178*  BUN 20  CREATININE 1.00  CALCIUM 9.5   Liver Function Tests: No results for input(s): AST, ALT, ALKPHOS, BILITOT, PROT, ALBUMIN in the last 168 hours. No results for input(s): LIPASE, AMYLASE in the last 168 hours. No results for input(s): AMMONIA in the last 168 hours. CBC:  Recent Labs Lab 03/04/17 1347  WBC 12.2*  HGB 14.7  HCT 43.5  MCV 89.3  PLT 274   Cardiac Enzymes: No results for input(s): CKTOTAL, CKMB,  CKMBINDEX, TROPONINI in the last 168 hours.  BNP (last 3 results) No results for input(s): BNP in the last 8760 hours.  ProBNP (last 3 results) No results for input(s): PROBNP in the last 8760 hours.  CBG: No results for input(s): GLUCAP in the last 168 hours.  Radiological Exams on Admission: No results found.  EKG: Independently reviewed.  Assessment/Plan Principal Problem:   Sepsis ([place]) Active Problems:   Malignant neoplasm of prostate ([place])   Urinary retention   UTI (urinary tract infection)   Will admit to floor with IV fluids and IV ABX. Cultures sent. Consult Urology. Repeat labs in AM.  Diet: regular Fluids: NS@75  DVT Prophylaxis: SQ Heparin  Code Status: FULL  Family Communication: yes  Disposition Plan: home  Time spent: 55 min

## 2017-03-04 NOTE — Progress Notes (Signed)
Pharmacy Antibiotic Note  Vincent Poole is a 82 y.o. male admitted on 03/04/2017 with UTI.  Pharmacy has been consulted for cefepime dosing.  Plan: Cefepime 1g IV Q12hr. Recommend de-escalating to CTX for UTI.   Height: 5\' 9"  (175.3 cm) Weight: 199 lb (90.3 kg) IBW/kg (Calculated) : 70.7  Temp (24hrs), Avg:97.7 F (36.5 C), Min:97.7 F (36.5 C), Max:97.7 F (36.5 C)   Recent Labs Lab 03/04/17 1347 03/04/17 1518  WBC 12.2*  --   CREATININE 1.00  --   LATICACIDVEN  --  2.9*    Estimated Creatinine Clearance: 66.5 mL/min (by C-G formula based on SCr of 1 mg/dL).    No Known Allergies  Antimicrobials this admission: Cefepime 4/28 >>  Microbiology results: 4/28 BCx: in process 4/28 UCx: in process  Thank you for allowing pharmacy to be a part of this patient's care.  Loree Fee, PharmD 03/04/2017 4:14 PM

## 2017-03-04 NOTE — ED Notes (Signed)
Bladder scan: 261mL

## 2017-03-04 NOTE — ED Triage Notes (Signed)
Pt presents to ED c/o painful urination and pain to R flank starting today.

## 2017-03-04 NOTE — H&P (Signed)
Urology Consult  I have been asked to see the patient by Dr. Doy Hutching, for evaluation and management of urinary retention.  Chief Complaint: Difficulty urinating  History of Present Illness: Vincent Poole is a 82 y.o. year old admitted with a UTI and urinary retention. He reports that he's had burning, chills, and difficulty urinating since yesterday. He had lower abdominal discomfort and felt like his bladder was blocked. Foley catheter was placed in the emergency room with unclear volume of urinary return ( ? documented bladder scan 222 cc).  No fevers or gross hematuria. He has no other complaints other than some chronic low back pain and lower extremity.  In the ER, he had a mild leukocytosis to 12, mildly elevated lactate, and a frankly positive UA.  He does have a personal history of prostate cancer as well as urinary retention and is followed in our clinic. He was last seen in 11/2016 by Dr. Louis Meckel.    He had a personal history of urinary retention and UTI (Proteus) similar to this presentation in 07/2016. He was started on finasteride and Flomax and successfully passed a voiding trial.    He was dx with Gleason 4+ 3=7 prostate cancer, pretreatment PSA 29, and an abnormal rectal exam for which he was treated with external beam radiation therapy in 2012. He received androgen deprivation therapy with his last injection on 07/03/12.  His most recent PSA 0.02 on 2/18.  Additional history was difficult to obtain this evening due to severe hearing impairment. His wife is no longer at his bedside.  Past Medical History:  Diagnosis Date  . Anxiety   . Arthritis   . Prostate cancer [facility])     Past Surgical History:  Procedure Laterality Date  . APPENDECTOMY    . CHOLECYSTECTOMY      Home Medications:  Current Meds  Medication Sig  . acetaminophen (TYLENOL) 500 MG tablet Take 500 mg by mouth every 6 (six) hours as needed.  . finasteride (PROSCAR) 5 MG tablet Take 1 tablet (5 mg  total) by mouth daily.  Marland Kitchen lisinopril-hydrochlorothiazide (PRINZIDE,ZESTORETIC) 10-12.5 MG per tablet Take 0.5 tablets by mouth daily.   . tamsulosin (FLOMAX) 0.4 MG CAPS capsule Take 1 capsule (0.4 mg total) by mouth daily after supper.    Allergies: No Known Allergies  Family History  Problem Relation Age of Onset  . Cancer Mother   . Chronic Renal Failure Mother   . Heart disease Father     Social History:  reports that he has never smoked. He has never used smokeless tobacco. He reports that he does not drink alcohol or use drugs.  ROS: A complete review of systems was performed.  All systems are negative except for pertinent findings as noted.  Physical Exam:  Vital signs in last 24 hours: Temp:  [97.7 F (36.5 C)] 97.7 F (36.5 C) (04/28 1346) Pulse Rate:  [93-111] 111 (04/28 1826) Resp:  [16-25] 22 (04/28 1826) BP: (123-158)/(74-99) 156/85 (04/28 1826) SpO2:  [92 %-99 %] 98 % (04/28 1826) Weight:  [199 lb (90.3 kg)] 199 lb (90.3 kg) (04/28 1346) Constitutional:  Alert and oriented, No acute distress.  Slightly tremulous. Very hard of hearing. HEENT: [place] AT, moist mucus membranes.  Trachea midline, no masses Cardiovascular: Regular rate and rhythm, no clubbing, cyanosis, or edema. Respiratory: Normal respiratory effort, lungs clear bilaterally GI: Abdomen is soft, nontender, nondistended, no abdominal masses GU: No CVA tenderness.  Foley catheter in place during clear yellow  urine. Circumcised phallus. Bilateral descended testicle without scrotal changes. Skin: No rashes, bruises or suspicious lesions Neurologic: Grossly intact, no focal deficits, moving all 4 extremities Psychiatric: Normal mood and affect   Laboratory Data:   Recent Labs  03/04/17 1347  WBC 12.2*  HGB 14.7  HCT 43.5    Recent Labs  03/04/17 1347  NA 137  K 3.5  CL 105  CO2 20*  GLUCOSE 178*  BUN 20  CREATININE 1.00  CALCIUM 9.5   Component     Latest Ref Rng & Units 03/04/2017    Color, Urine     YELLOW YELLOW (A)  Appearance     CLEAR CLOUDY (A)  Glucose     NEGATIVE mg/dL NEGATIVE  Bilirubin Urine     NEGATIVE NEGATIVE  Ketones, ur     NEGATIVE mg/dL NEGATIVE  Specific Gravity, Urine     1.005 - 1.030 1.015  Hgb urine dipstick     NEGATIVE LARGE (A)  pH     5.0 - 8.0 5.0  Protein     NEGATIVE mg/dL 100 (A)  Nitrite     NEGATIVE NEGATIVE  Leukocytes, UA     NEGATIVE MODERATE (A)  RBC / HPF     0 - 5 RBC/hpf TOO NUMEROUS TO COUNT  WBC, UA     0 - 5 WBC/hpf TOO NUMEROUS TO COUNT  Bacteria, UA     NONE SEEN RARE (A)  Squamous Epithelial / LPF     NONE SEEN NONE SEEN  WBC Clumps      PRESENT  Mucous      PRESENT   Radiologic Imaging: No results found.  Assessment/ Plan: 82 year old male with recurrent urinary retention, remote history of prostate cancer currently admitted with a UTI.  1. Urinary retention-unclear presentation is truly urinary retention or incomplete bladder emptying as bladder scan was only 222 prior to Foley catheter placement. Continue Flomax and finasteride. Maintain Foley catheter for 7 days as previously with outpatient voiding trial.  He may benefit from a channel TURP down the road given that he is currently on maximal medical therapy with recurrent retention/UTIs.  2. UTI- previously grew Proteus sensitive to ceftriaxone. We transitioned to by mouth Keflex based on previous sensitivity 10 days at the time of discharge  3. History of prostate cancer- currently no evidence of disease, PSA low and stable.  Urology will sign off, outpatient follow-up has been arranged.  03/04/2017, 8:26 PM  [name],  MD

## 2017-03-04 NOTE — ED Notes (Signed)
Patient being transferred to floor at this time

## 2017-03-04 NOTE — ED Provider Notes (Signed)
[facility] Emergency Department Provider Note   ____________________________________________   None    (approximate)  I have reviewed the triage vital signs and the nursing notes.   HISTORY  Chief Complaint Dysuria and Flank Pain    HPI Vincent Poole is a 82 y.o. male here for evaluation of inability to urinate and burning with urination starting yesterday  Patient has been unable to urinate this morning. Reports he been having burning pain and chills today. No nausea or vomiting. No trouble breathing or cough. Reports he feels significant pain in his lower abdomen, feels  like he cannot urinate at all. He's had similar in the past with "sepsis"  Describe severe pain, lower abdomen. Feels like his "bladder is blocked". Onset was somewhat gradual this morning but is unable to pee.  Past Medical History:  Diagnosis Date  . Anxiety   . Arthritis   . Prostate cancer [facility])     Patient Active Problem List   Diagnosis Date Noted  . UTI (urinary tract infection) 03/04/2017  . Essential hypertension 08/30/2016  . History of shingles 08/30/2016  . Prostate cancer ([place]) 08/30/2016  . Urinary retention 08/30/2016  . Medicare annual wellness visit, initial 08/01/2016  . Medicare annual wellness visit, subsequent 08/01/2016  . Sepsis ([place]) 07/11/2016  . Borderline diabetes mellitus 01/26/2016  . Vaccine counseling 01/26/2015  . Lumbar radiculitis 06/24/2014  . OA (osteoarthritis) of hip 06/24/2014  . Malignant neoplasm of prostate ([place]) 01/27/2011    Past Surgical History:  Procedure Laterality Date  . APPENDECTOMY    . CHOLECYSTECTOMY      Prior to Admission medications   Medication Sig Start Date End Date Taking? Authorizing Provider  acetaminophen (TYLENOL) 500 MG tablet Take 500 mg by mouth every 6 (six) hours as needed.   Yes Historical Provider, MD  finasteride (PROSCAR) 5 MG tablet Take 1 tablet (5 mg total) by mouth daily. 11/28/16   Yes Ardis Hughs, MD  lisinopril-hydrochlorothiazide (PRINZIDE,ZESTORETIC) 10-12.5 MG per tablet Take 0.5 tablets by mouth daily.  07/03/12  Yes Historical Provider, MD  tamsulosin (FLOMAX) 0.4 MG CAPS capsule Take 1 capsule (0.4 mg total) by mouth daily after supper. 11/28/16  Yes Ardis Hughs, MD    Allergies Patient has no known allergies.  Family History  Problem Relation Age of Onset  . Cancer Mother   . Chronic Renal Failure Mother   . Heart disease Father     Social History Social History  Substance Use Topics  . Smoking status: Never Smoker  . Smokeless tobacco: Never Used  . Alcohol use No    Review of Systems Constitutional: No feverThough having chills Eyes: No visual changes. ENT: No sore throat. Cardiovascular: Denies chest pain. Respiratory: Denies shortness of breath. Gastrointestinal: See HPI   Genitourinary: See HPI Musculoskeletal: Negative for back pain. Skin: Negative for rash. Neurological: Negative for headaches, focal weakness or numbness.  10-point ROS otherwise negative.  ____________________________________________   PHYSICAL EXAM:  VITAL SIGNS: ED Triage Vitals [03/04/17 1346]  Enc Vitals Group     BP (!) 158/99     Pulse Rate (!) 108     Resp 20     Temp 97.7 F (36.5 C)     Temp Source Oral     SpO2 98 %     Weight 199 lb (90.3 kg)     Height 5\' 9"  (1.753 m)     Head Circumference      Peak Flow  Pain Score 10     Pain Loc      Pain Edu?      Excl. in [place]?     Constitutional: Alert and oriented. Very hard of hearing, but understands what is spoken to loudly. He is fully alert, but appears in pain noting significant pain and feeling like he cannot urinate  Eyes: Conjunctivae are normal. PERRL. EOMI. Head: Atraumatic. Nose: No congestion/rhinnorhea. Mouth/Throat: Mucous membranes are slightly dry.  Oropharynx non-erythematous. Neck: No stridor.   Cardiovascular: Tachycardic rate, regular rhythm. Grossly normal  heart sounds.  Good peripheral circulation. Respiratory: Normal respiratory effort.  No retractions. Lungs CTAB. Gastrointestinal: Soft and nontender across the upper abdomen however is slightly distended suprapubically reports significant pain to palpation over the pelvic brim Normal scrotum and penis. usculoskeletal: No lower extremity tenderness nor edema.  No joint effusions. Neurologic:  Normal speech and language. No gross focal neurologic deficits are appreciated.  Skin:  Skin is warm, dry and intact. No rash noted. Psychiatric: Mood and affect are normal. Speech and behavior are normal.  ____________________________________________   LABS (all labs ordered are listed, but only abnormal results are displayed)  Labs Reviewed  CBC - Abnormal; Notable for the following:       Result Value   WBC 12.2 (*)    All other components within normal limits  BASIC METABOLIC PANEL - Abnormal; Notable for the following:    CO2 20 (*)    Glucose, Bld 178 (*)    All other components within normal limits  LACTIC ACID, PLASMA - Abnormal; Notable for the following:    Lactic Acid, Venous 2.9 (*)    All other components within normal limits  URINALYSIS, COMPLETE (UACMP) WITH MICROSCOPIC - Abnormal; Notable for the following:    Color, Urine YELLOW (*)    APPearance CLOUDY (*)    Hgb urine dipstick LARGE (*)    Protein, ur 100 (*)    Leukocytes, UA MODERATE (*)    Bacteria, UA RARE (*)    All other components within normal limits  CULTURE, BLOOD (ROUTINE X 2)  CULTURE, BLOOD (ROUTINE X 2)  URINE CULTURE  LACTIC ACID, PLASMA  LACTIC ACID, PLASMA   ____________________________________________  EKG   ____________________________________________  RADIOLOGY  No results found.  ____________________________________________   PROCEDURES  Procedure(s) performed: None  Procedures  Critical Care performed: No  ____________________________________________   INITIAL IMPRESSION /  ASSESSMENT AND PLAN / ED COURSE  Pertinent labs & imaging results that were available during my care of the patient were reviewed by me and considered in my medical decision making (see chart for details).  Patient has for evaluation of dysuria, chills, difficulty passing urine. Reports starting today. Has a history of sepsis due to Proteus which was cephalosporins sensitive in the past.  ----------------------------------------- 5:24 PM on 03/04/2017 -----------------------------------------  The patient is resting comfortably. Reports all pain has resolved and he is comfortable now with Foley catheter placement. Draining somewhat sedimented dark appearing urine. Urinalysis positive with multiple red cells leukocytes and bacteria. Concerning for recurrent urosepsis/sepsis/urinary tract infection  Broad spectrum cephalosporin initiated. Additional IV fluids initiated for elevated lactate. Repeat lactate ordered, and could sepsis has been paged. Hospitalist service consulted for admission. The patient appears to be improving  ED Sepsis - Repeat Assessment   Performed at:    03/04/2017 at 5:30 PM  Last Vitals:    Blood pressure 136/74, pulse 93, temperature 97.7 F (36.5 C), temperature source Oral, resp. rate (!) 22, height  5\' 9"  (1.753 m), weight 199 lb (90.3 kg), SpO2 95 %.  Heart:      90, clear tones  Lungs:     Clear lungs  Capillary Refill:   Normal < 1 sec right hand  Peripheral Pulse (include location): Right radial   Skin (include color):   Warm, well-perfused       ____________________________________________   FINAL CLINICAL IMPRESSION(S) / ED DIAGNOSES  Final diagnoses:  Urinary obstruction  Acute cystitis with hematuria  Sepsis, due to unspecified organism [facility])      NEW MEDICATIONS STARTED DURING THIS VISIT:  New Prescriptions   No medications on file     Note:  This document was prepared using Dragon voice recognition software and may include  unintentional dictation errors.     Delman Kitten, MD 03/04/17 1726

## 2017-03-05 DIAGNOSIS — A419 Sepsis, unspecified organism: Secondary | ICD-10-CM | POA: Diagnosis not present

## 2017-03-05 DIAGNOSIS — N3001 Acute cystitis with hematuria: Secondary | ICD-10-CM | POA: Diagnosis not present

## 2017-03-05 LAB — CBC
HCT: 39 % — ABNORMAL LOW (ref 40.0–52.0)
Hemoglobin: 13.2 g/dL (ref 13.0–18.0)
MCH: 30.4 pg (ref 26.0–34.0)
MCHC: 34 g/dL (ref 32.0–36.0)
MCV: 89.6 fL (ref 80.0–100.0)
PLATELETS: 199 10*3/uL (ref 150–440)
RBC: 4.35 MIL/uL — ABNORMAL LOW (ref 4.40–5.90)
RDW: 14.4 % (ref 11.5–14.5)
WBC: 9.8 10*3/uL (ref 3.8–10.6)

## 2017-03-05 LAB — COMPREHENSIVE METABOLIC PANEL
ALBUMIN: 2.9 g/dL — AB (ref 3.5–5.0)
ALK PHOS: 67 U/L (ref 38–126)
ALT: 8 U/L — AB (ref 17–63)
AST: 13 U/L — ABNORMAL LOW (ref 15–41)
Anion gap: 5 (ref 5–15)
BUN: 14 mg/dL (ref 6–20)
CALCIUM: 8.7 mg/dL — AB (ref 8.9–10.3)
CHLORIDE: 108 mmol/L (ref 101–111)
CO2: 25 mmol/L (ref 22–32)
CREATININE: 0.78 mg/dL (ref 0.61–1.24)
GFR calc non Af Amer: >60 mL/min (ref >60)
Glucose, Bld: 111 mg/dL — ABNORMAL HIGH (ref 65–99)
Potassium: 3.7 mmol/L (ref 3.5–5.1)
SODIUM: 138 mmol/L (ref 135–145)
TOTAL PROTEIN: 5.6 g/dL — AB (ref 6.5–8.1)
Total Bilirubin: 0.7 mg/dL (ref 0.3–1.2)

## 2017-03-05 LAB — URINE CULTURE: SPECIAL REQUESTS: NORMAL

## 2017-03-05 MED ORDER — CEPHALEXIN 500 MG PO CAPS: 500.0000 mg | ORAL_CAPSULE | Freq: Three times a day (TID) | ORAL | 0 refills | Status: AC

## 2017-03-05 MED ORDER — LISINOPRIL 5 MG PO TABS: 5.0000 mg | ORAL_TABLET | Freq: Every day | ORAL | Status: DC

## 2017-03-05 MED ORDER — HYDROCHLOROTHIAZIDE 10 MG/ML ORAL SUSPENSION
6.2500 mg | Freq: Every day | ORAL | Status: DC
  Filled 2017-03-05: qty 1.25

## 2017-03-05 MED ORDER — HYDROCHLOROTHIAZIDE 10 MG/ML ORAL SUSPENSION: 6.2500 mg | Freq: Every day | ORAL | Status: DC

## 2017-03-05 NOTE — Discharge Summary (Signed)
[place] [facility] [place] NAME: Vincent Poole    MR#:  [id number]  DATE OF BIRTH:  1935/07/01  DATE OF ADMISSION:  03/04/2017 ADMITTING PHYSICIAN: Idelle Crouch, MD  DATE OF DISCHARGE: 03/05/2017  PRIMARY CARE PHYSICIAN: [name], MD    ADMISSION DIAGNOSIS:  Acute cystitis with hematuria [N30.01] Urinary obstruction [N13.9] Sepsis, due to unspecified organism [facility]) [A41.9]  DISCHARGE DIAGNOSIS:  Principal Problem:   Sepsis ([place]) Active Problems:   Malignant neoplasm of prostate ([place])   Urinary retention   UTI (urinary tract infection)   Urinary obstruction   SECONDARY DIAGNOSIS:   Past Medical History:  Diagnosis Date  . Anxiety   . Arthritis   . Prostate cancer [facility])     HOSPITAL COURSE:   82 year old male with a history of prostate cancer and BPH who presents with urinary retention.   1. Sepsis: Patient presented with mild leukocytosis and tachycardia. Sepsis due to urinary tract infection Blood cultures have been negative Lactic acid has normalized   2. UTI: Patient is previously grown Proteus sensitive to ceftriaxone. He will be discharged with Keflex for total of 10 days.  3. Urinary retention: Patient evaluated by urology. He is on Flomax and finasteride. He will need to maintain Foley catheter for 7 days and then have a voiding trial. As per urology consult,He may benefit from a channel TURP down the road given that he is currently on maximal medical therapy with recurrent retention/UTIs.  4. Essential hypertension: Patient may continue lisinopril/HCTZ  5. History of prostate cancer: Currently no evidence of disease  DISCHARGE CONDITIONS AND DIET:   Stable for discharge on heart healthy diet  CONSULTS OBTAINED:  Treatment Team:  [name], MD  DRUG ALLERGIES:  No Known Allergies  DISCHARGE MEDICATIONS:   Current Discharge Medication List    START taking these medications   Details   cephALEXin (KEFLEX) 500 MG capsule Take 1 capsule (500 mg total) by mouth 3 (three) times daily. Qty: 27 capsule, Refills: 0      CONTINUE these medications which have NOT CHANGED   Details  acetaminophen (TYLENOL) 500 MG tablet Take 500 mg by mouth every 6 (six) hours as needed.    finasteride (PROSCAR) 5 MG tablet Take 1 tablet (5 mg total) by mouth daily. Qty: 90 tablet, Refills: 3   Associated Diagnoses: Urinary retention    lisinopril-hydrochlorothiazide (PRINZIDE,ZESTORETIC) 10-12.5 MG per tablet Take 0.5 tablets by mouth daily.     tamsulosin (FLOMAX) 0.4 MG CAPS capsule Take 1 capsule (0.4 mg total) by mouth daily after supper. Qty: 90 capsule, Refills: 3          Today   CHIEF COMPLAINT:  Patient without complaints morning. He is hard of hearing   VITAL SIGNS:  Blood pressure 139/62, pulse 85, temperature 98.1 F (36.7 C), temperature source Oral, resp. rate 19, height 5\' 9"  (1.753 m), weight 95.7 kg (210 lb 14.4 oz), SpO2 97 %.   REVIEW OF SYSTEMS:  Review of Systems  Constitutional: Negative.  Negative for chills, fever and malaise/fatigue.  HENT: Negative for ear discharge, ear pain, nosebleeds and sore throat.   Eyes: Negative.  Negative for blurred vision and pain.  Respiratory: Negative.  Negative for cough, hemoptysis, shortness of breath and wheezing.   Cardiovascular: Negative.  Negative for chest pain, palpitations and leg swelling.  Gastrointestinal: Negative.  Negative for abdominal pain, blood in stool, diarrhea, nausea and vomiting.  Genitourinary: Positive for frequency (better). Negative  for dysuria.  Musculoskeletal: Negative.  Negative for back pain.  Skin: Negative.   Neurological: Negative for dizziness, tremors, speech change, focal weakness, seizures and headaches.  Endo/Heme/Allergies: Negative.  Does not bruise/bleed easily.  Psychiatric/Behavioral: Negative.  Negative for depression, hallucinations and suicidal ideas.     PHYSICAL  EXAMINATION:  GENERAL:  82 y.o.-year-old patient lying in the bed with no acute distress.  Hard of hearing NECK:  Supple, no jugular venous distention. No thyroid enlargement, no tenderness.  LUNGS: Normal breath sounds bilaterally, no wheezing, rales,rhonchi  No use of accessory muscles of respiration.  CARDIOVASCULAR: S1, S2 normal. No murmurs, rubs, or gallops.  ABDOMEN: Soft, non-tender, non-distended. Bowel sounds present. No organomegaly or mass.  EXTREMITIES: No pedal edema, cyanosis, or clubbing.  PSYCHIATRIC: The patient is alert and oriented x 3.  SKIN: No obvious rash, lesion, or ulcer.   DATA REVIEW:   CBC  Recent Labs Lab 03/05/17 0331  WBC 9.8  HGB 13.2  HCT 39.0*  PLT 199    Chemistries   Recent Labs Lab 03/05/17 0331  NA 138  K 3.7  CL 108  CO2 25  GLUCOSE 111*  BUN 14  CREATININE 0.78  CALCIUM 8.7*  AST 13*  ALT 8*  ALKPHOS 67  BILITOT 0.7    Cardiac Enzymes No results for input(s): TROPONINI in the last 168 hours.  Microbiology Results  @MICRORSLT48 @  RADIOLOGY:  No results found.    Current Discharge Medication List    START taking these medications   Details  cephALEXin (KEFLEX) 500 MG capsule Take 1 capsule (500 mg total) by mouth 3 (three) times daily. Qty: 27 capsule, Refills: 0      CONTINUE these medications which have NOT CHANGED   Details  acetaminophen (TYLENOL) 500 MG tablet Take 500 mg by mouth every 6 (six) hours as needed.    finasteride (PROSCAR) 5 MG tablet Take 1 tablet (5 mg total) by mouth daily. Qty: 90 tablet, Refills: 3   Associated Diagnoses: Urinary retention    lisinopril-hydrochlorothiazide (PRINZIDE,ZESTORETIC) 10-12.5 MG per tablet Take 0.5 tablets by mouth daily.     tamsulosin (FLOMAX) 0.4 MG CAPS capsule Take 1 capsule (0.4 mg total) by mouth daily after supper. Qty: 90 capsule, Refills: 3           Management plans discussed with the patient and he is in agreement. Stable for discharge  home  Patient should follow up with pcp and urology  CODE STATUS:     Code Status Orders        Start     Ordered   03/04/17 1834  Full code  Continuous     03/04/17 1833    Code Status History    Date Active Date Inactive Code Status Order ID Comments User Context   07/11/2016  5:38 AM 07/13/2016  2:20 PM Full Code [id number]  Saundra Shelling, MD ED      TOTAL TIME TAKING CARE OF THIS PATIENT: 30 minutes.    Note: This dictation was prepared with Dragon dictation along with smaller phrase technology. Any transcriptional errors that result from this process are unintentional.  Haizel Gatchell M.D on 03/05/2017 at 8:38 AM  Between 7am to 6pm - Pager - [phone] After 6pm go to [email or web address] - password [email or web address] [place] [facility]  Office  [phone]  CC: Primary care physician; [name], MD

## 2017-03-05 NOTE — Progress Notes (Addendum)
Patient is being discharged to home with wife. Discharge & Rx instructions given and patient acknowledged instructions. Instructed wife on foley care and hygiene. Gave appropriate cleaning cloths/graduated container/leg bag. Wife told this nurse that she is familiar with foley care since her husband has had this problem in the past. Nurse answered all family's questions.  Family denies morning meds since patient is going home and he can take them there.

## 2017-03-09 LAB — CULTURE, BLOOD (ROUTINE X 2)
CULTURE: NO GROWTH
CULTURE: NO GROWTH
SPECIAL REQUESTS: ADEQUATE
Special Requests: ADEQUATE

## 2017-03-13 ENCOUNTER — Encounter: Payer: Self-pay | Admitting: Urology

## 2017-03-13 ENCOUNTER — Ambulatory Visit (INDEPENDENT_AMBULATORY_CARE_PROVIDER_SITE_OTHER): Payer: Medicare Other | Admitting: Urology

## 2017-03-13 VITALS — BP 107/63 | HR 98 | Ht 69.0 in | Wt 200.8 lb

## 2017-03-13 DIAGNOSIS — R339 Retention of urine, unspecified: Secondary | ICD-10-CM

## 2017-03-13 LAB — BLADDER SCAN AMB NON-IMAGING: SCAN RESULT: 88

## 2017-03-13 NOTE — Progress Notes (Signed)
Catheter Removal  Patient is present today for a catheter removal.  49ml of water was drained from the balloon. A 16FR foley cath was removed from the bladder no complications were noted . Patient tolerated well.  Preformed by: Elberta Leatherwood, CMA  Follow up/ Additional notes: This afternoon for a PVR

## 2017-03-13 NOTE — Progress Notes (Signed)
Bladder Scan Patient void: 42mL Performed ByK.Kaziah Krizek,CMA

## 2017-03-13 NOTE — Progress Notes (Signed)
03/13/2017 8:42 AM   [name] [birth year] [id number]  Referring provider: [name], MD [place] [facility] [place], [place] [address]  Chief Complaint  Patient presents with  . Urinary Retention    HPI: [name]: 82 year old male who is seen today as follow-up from a recent hospitalization where he is seen for a Klebsiella urinary tract infection and associated urinary retention. The patient was discharged home on ciprofloxacin times 7 days as well as a Foley catheter. He was also started on tamsulosin and finasteride.  The patient subsequently underwent a voiding trial and has been now voiding on his own for the past few months. His current PVR is 16.   Prior to starting the finasteride and Flomax, the patient was getting up 5 times at night to urinate and straining to void. The patient continues to have nocturia times 3 with daytime urinary frequency. His symptoms are unchanged over the course of the last 3 months.  The patient has a past GU history significant for Gleason 4+ 3=7 prostate cancer, pretreatment PSA 29, and an abnormal rectal exam for which he was treated with external beam radiation therapy in 2012. He received androgen deprivation therapy with his last injection on 07/03/12.  His most recent PSA as documented in the EMR is on 12/24/15, 0.05.  Today The patient 1 week ago was having spasms and I think difficult to urinate. They were concerned about him becoming septic again today put in a catheter. I kept him in overnight. He is just finishing his prescription of Keflex. Prior to this point he subjectively was voiding well on the 2 prostate medications His urine culture April 28 was normal. Blood culture was normal  Modifying factors: There are no other modifying factors  Associated signs and symptoms: There are no other associated signs and symptoms Aggravating and relieving factors: There are no other aggravating or relieving  factors Severity: Moderate Duration: Persistent   PMH: Past Medical History:  Diagnosis Date  . Anxiety   . Arthritis   . Prostate cancer [facility])     Surgical History: Past Surgical History:  Procedure Laterality Date  . APPENDECTOMY    . CHOLECYSTECTOMY      Home Medications:  Allergies as of 03/13/2017   No Known Allergies     Medication List       Accurate as of 03/13/17  8:42 AM. Always use your most recent med list.          acetaminophen 500 MG tablet Commonly known as:  TYLENOL Take 500 mg by mouth every 6 (six) hours as needed.   cephALEXin 500 MG capsule Commonly known as:  KEFLEX Take 1 capsule (500 mg total) by mouth 3 (three) times daily.   finasteride 5 MG tablet Commonly known as:  PROSCAR Take 1 tablet (5 mg total) by mouth daily.   lisinopril-hydrochlorothiazide 10-12.5 MG tablet Commonly known as:  PRINZIDE,ZESTORETIC Take 0.5 tablets by mouth daily.   tamsulosin 0.4 MG Caps capsule Commonly known as:  FLOMAX Take 1 capsule (0.4 mg total) by mouth daily after supper.       Allergies: No Known Allergies  Family History: Family History  Problem Relation Age of Onset  . Cancer Mother   . Chronic Renal Failure Mother   . Heart disease Father     Social History:  reports that he has never smoked. He has never used smokeless tobacco. He reports that he does not drink alcohol or use drugs.  ROS: UROLOGY Frequent Urination?: Yes Hard to postpone urination?: No Burning/pain with urination?: No Get up at night to urinate?: Yes Leakage of urine?: No Urine stream starts and stops?: No Trouble starting stream?: No Do you have to strain to urinate?: No Blood in urine?: No Urinary tract infection?: Yes Sexually transmitted disease?: No Injury to kidneys or bladder?: No Painful intercourse?: No Weak stream?: No Erection problems?: No Penile pain?: No  Gastrointestinal Nausea?: No Vomiting?: No Indigestion/heartburn?: No Diarrhea?:  No Constipation?: No  Constitutional Fever: No Night sweats?: No Weight loss?: No Fatigue?: No  Skin Skin rash/lesions?: No Itching?: No  Eyes Blurred vision?: No Double vision?: No  Ears/Nose/Throat Sore throat?: No Sinus problems?: No  Hematologic/Lymphatic Swollen glands?: No Easy bruising?: No  Cardiovascular Leg swelling?: No Chest pain?: No  Respiratory Cough?: No Shortness of breath?: No  Endocrine Excessive thirst?: No  Musculoskeletal Back pain?: No Joint pain?: No  Neurological Headaches?: No Dizziness?: No  Psychologic Depression?: No Anxiety?: No  Physical Exam: There were no vitals taken for this visit.  Constitutional:  Alert and oriented, No acute distress.   Laboratory Data: Lab Results  Component Value Date   WBC 9.8 03/05/2017   HGB 13.2 03/05/2017   HCT 39.0 (L) 03/05/2017   MCV 89.6 03/05/2017   PLT 199 03/05/2017    Lab Results  Component Value Date   CREATININE 0.78 03/05/2017    Lab Results  Component Value Date   PSA 0.02 12/22/2016   PSA 0.05 12/24/2015   PSA 0.05 05/27/2015    No results found for: TESTOSTERONE  No results found for: HGBA1C  Urinalysis    Component Value Date/Time   COLORURINE YELLOW (A) 03/04/2017 1518   APPEARANCEUR CLOUDY (A) 03/04/2017 1518   LABSPEC 1.015 03/04/2017 1518   PHURINE 5.0 03/04/2017 1518   GLUCOSEU NEGATIVE 03/04/2017 1518   HGBUR LARGE (A) 03/04/2017 1518   BILIRUBINUR NEGATIVE 03/04/2017 1518   KETONESUR NEGATIVE 03/04/2017 1518   PROTEINUR 100 (A) 03/04/2017 1518   NITRITE NEGATIVE 03/04/2017 1518   LEUKOCYTESUR MODERATE (A) 03/04/2017 1518    Pertinent Imaging: none Assessment & Plan:  The patient may or may not have had true retention. He will have a fill and pull. We will check another residual in 6 weeks and then go back to once a year  His residual this afternoon was 70 mL.  There are no diagnoses linked to this encounter.  No Follow-up on  file.  Reece Packer, MD  [facility] [address], [place], [place] [address] [phone]

## 2017-03-14 ENCOUNTER — Ambulatory Visit: Payer: Medicare Other

## 2017-04-24 ENCOUNTER — Ambulatory Visit: Payer: Medicare Other

## 2017-05-01 ENCOUNTER — Encounter: Payer: Self-pay | Admitting: Urology

## 2017-05-01 ENCOUNTER — Ambulatory Visit (INDEPENDENT_AMBULATORY_CARE_PROVIDER_SITE_OTHER): Payer: Medicare Other | Admitting: Urology

## 2017-05-01 VITALS — BP 94/58 | HR 103 | Ht 69.0 in | Wt 195.5 lb

## 2017-05-01 DIAGNOSIS — R339 Retention of urine, unspecified: Secondary | ICD-10-CM

## 2017-05-01 LAB — BLADDER SCAN AMB NON-IMAGING: Scan Result: 39

## 2017-05-01 NOTE — Progress Notes (Signed)
05/01/2017 10:11 AM   Vincent Poole January 11, 1935 [id number]  Referring provider: [name], MD [place] [facility] [place], [place] [address]  Chief Complaint  Patient presents with  . Urinary Retention    HPI: The patient had a fill and pull for retention on May 7. I dictated a lengthy note on that day. The patient had been placed on finasteride and Flomax and is known to have prostate cancer. The patient has had radiation. He has had positive cultures with urosepsis in the past. His residual was 70 mL last visit  Today Frequency is stable. Flow was good. Clinically noninfected.  Bladder scan residual was 39 mL   PMH: Past Medical History:  Diagnosis Date  . Anxiety   . Arthritis   . Prostate cancer [facility])     Surgical History: Past Surgical History:  Procedure Laterality Date  . APPENDECTOMY    . CHOLECYSTECTOMY      Home Medications:  Allergies as of 05/01/2017   No Known Allergies     Medication List       Accurate as of 05/01/17 10:11 AM. Always use your most recent med list.          acetaminophen 500 MG tablet Commonly known as:  TYLENOL Take 500 mg by mouth every 6 (six) hours as needed.   finasteride 5 MG tablet Commonly known as:  PROSCAR Take 1 tablet (5 mg total) by mouth daily.   lisinopril-hydrochlorothiazide 10-12.5 MG tablet Commonly known as:  PRINZIDE,ZESTORETIC Take 0.5 tablets by mouth daily.   tamsulosin 0.4 MG Caps capsule Commonly known as:  FLOMAX Take 1 capsule (0.4 mg total) by mouth daily after supper.       Allergies: No Known Allergies  Family History: Family History  Problem Relation Age of Onset  . Cancer Mother   . Chronic Renal Failure Mother   . Heart disease Father     Social History:  reports that he has never smoked. He has never used smokeless tobacco. He reports that he does not drink alcohol or use drugs.  ROS: UROLOGY Frequent Urination?: Yes Hard to postpone  urination?: No Burning/pain with urination?: No Get up at night to urinate?: No Leakage of urine?: No Urine stream starts and stops?: No Trouble starting stream?: No Do you have to strain to urinate?: No Blood in urine?: No Urinary tract infection?: No Sexually transmitted disease?: No Injury to kidneys or bladder?: No Painful intercourse?: No Weak stream?: No Erection problems?: No Penile pain?: No  Gastrointestinal Nausea?: Yes Vomiting?: No Indigestion/heartburn?: No Diarrhea?: No Constipation?: No  Constitutional Fever: No Night sweats?: No Weight loss?: No Fatigue?: No  Skin Skin rash/lesions?: No Itching?: No  Eyes Blurred vision?: No Double vision?: No  Ears/Nose/Throat Sore throat?: No Sinus problems?: No  Hematologic/Lymphatic Swollen glands?: No Easy bruising?: No  Cardiovascular Leg swelling?: No Chest pain?: No  Respiratory Cough?: No Shortness of breath?: No  Endocrine Excessive thirst?: No  Musculoskeletal Back pain?: No Joint pain?: Yes  Neurological Headaches?: No Dizziness?: No  Psychologic Depression?: No Anxiety?: No  Physical Exam: BP (!) 94/58 (BP Location: Left Arm, Patient Position: Sitting, Cuff Size: Normal)   Pulse (!) 103   Ht 5\' 9"  (1.753 m)   Wt 88.7 kg (195 lb 8 oz)   BMI 28.87 kg/m   Constitutional:  Alert and oriented, No acute distress.   Laboratory Data: Lab Results  Component Value Date   WBC 9.8 03/05/2017   HGB  13.2 03/05/2017   HCT 39.0 (L) 03/05/2017   MCV 89.6 03/05/2017   PLT 199 03/05/2017    Lab Results  Component Value Date   CREATININE 0.78 03/05/2017    Lab Results  Component Value Date   PSA 0.02 12/22/2016   PSA 0.05 12/24/2015   PSA 0.05 05/27/2015    No results found for: TESTOSTERONE  No results found for: HGBA1C  Urinalysis    Component Value Date/Time   COLORURINE YELLOW (A) 03/04/2017 1518   APPEARANCEUR CLOUDY (A) 03/04/2017 1518   LABSPEC 1.015  03/04/2017 1518   PHURINE 5.0 03/04/2017 1518   GLUCOSEU NEGATIVE 03/04/2017 1518   HGBUR LARGE (A) 03/04/2017 1518   BILIRUBINUR NEGATIVE 03/04/2017 1518   KETONESUR NEGATIVE 03/04/2017 1518   PROTEINUR 100 (A) 03/04/2017 1518   NITRITE NEGATIVE 03/04/2017 1518   LEUKOCYTESUR MODERATE (A) 03/04/2017 1518    Pertinent Imaging: none  Assessment & Plan:  Follow-up on 2 prostate medications next February as scheduled  1. Urine retention  - Bladder Scan (Post Void Residual) in office   No Follow-up on file.  Reece Packer, MD  [facility] [address] [place], [place] [address] [phone]

## 2017-05-30 ENCOUNTER — Observation Stay
Admission: EM | Admit: 2017-05-30 | Discharge: 2017-06-01 | Disposition: A | Discharge status: Home / Self Care | Payer: Medicare Other | Attending: Internal Medicine | Admitting: Internal Medicine

## 2017-05-30 ENCOUNTER — Emergency Department: Payer: Medicare Other

## 2017-05-30 ENCOUNTER — Encounter: Payer: Self-pay | Admitting: Emergency Medicine

## 2017-05-30 DIAGNOSIS — N401 Enlarged prostate with lower urinary tract symptoms: Secondary | ICD-10-CM | POA: Diagnosis not present

## 2017-05-30 DIAGNOSIS — N400 Enlarged prostate without lower urinary tract symptoms: Secondary | ICD-10-CM | POA: Diagnosis not present

## 2017-05-30 DIAGNOSIS — Z87440 Personal history of urinary (tract) infections: Secondary | ICD-10-CM | POA: Insufficient documentation

## 2017-05-30 DIAGNOSIS — H9190 Unspecified hearing loss, unspecified ear: Secondary | ICD-10-CM | POA: Insufficient documentation

## 2017-05-30 DIAGNOSIS — C61 Malignant neoplasm of prostate: Secondary | ICD-10-CM | POA: Diagnosis not present

## 2017-05-30 DIAGNOSIS — N3000 Acute cystitis without hematuria: Secondary | ICD-10-CM | POA: Insufficient documentation

## 2017-05-30 DIAGNOSIS — Z79899 Other long term (current) drug therapy: Secondary | ICD-10-CM | POA: Diagnosis not present

## 2017-05-30 DIAGNOSIS — N12 Tubulo-interstitial nephritis, not specified as acute or chronic: Principal | ICD-10-CM | POA: Diagnosis present

## 2017-05-30 DIAGNOSIS — N138 Other obstructive and reflux uropathy: Secondary | ICD-10-CM | POA: Diagnosis not present

## 2017-05-30 DIAGNOSIS — E876 Hypokalemia: Secondary | ICD-10-CM | POA: Diagnosis not present

## 2017-05-30 DIAGNOSIS — I10 Essential (primary) hypertension: Secondary | ICD-10-CM | POA: Diagnosis not present

## 2017-05-30 DIAGNOSIS — R338 Other retention of urine: Secondary | ICD-10-CM | POA: Insufficient documentation

## 2017-05-30 DIAGNOSIS — M1610 Unilateral primary osteoarthritis, unspecified hip: Secondary | ICD-10-CM | POA: Diagnosis not present

## 2017-05-30 LAB — URINALYSIS, COMPLETE (UACMP) WITH MICROSCOPIC
BILIRUBIN URINE: NEGATIVE
GLUCOSE, UA: NEGATIVE mg/dL
Ketones, ur: NEGATIVE mg/dL
Nitrite: POSITIVE — AB
Protein, ur: 100 mg/dL — AB
SPECIFIC GRAVITY, URINE: 1.02 (ref 1.005–1.030)
Squamous Epithelial / LPF: NONE SEEN
pH: 5 (ref 5.0–8.0)

## 2017-05-30 LAB — BASIC METABOLIC PANEL
ANION GAP: 9 (ref 5–15)
BUN: 19 mg/dL (ref 6–20)
CALCIUM: 9 mg/dL (ref 8.9–10.3)
CHLORIDE: 105 mmol/L (ref 101–111)
CO2: 23 mmol/L (ref 22–32)
Creatinine, Ser: 1.11 mg/dL (ref 0.61–1.24)
GFR calc non Af Amer: >60 mL/min (ref >60)
Glucose, Bld: 129 mg/dL — ABNORMAL HIGH (ref 65–99)
Potassium: 3.7 mmol/L (ref 3.5–5.1)
SODIUM: 137 mmol/L (ref 135–145)

## 2017-05-30 LAB — CBC
HCT: 40.1 % (ref 40.0–52.0)
HEMOGLOBIN: 13.6 g/dL (ref 13.0–18.0)
MCH: 29.2 pg (ref 26.0–34.0)
MCHC: 33.9 g/dL (ref 32.0–36.0)
MCV: 86.2 fL (ref 80.0–100.0)
Platelets: 316 10*3/uL (ref 150–440)
RBC: 4.65 MIL/uL (ref 4.40–5.90)
RDW: 14.9 % — ABNORMAL HIGH (ref 11.5–14.5)
WBC: 13 10*3/uL — AB (ref 3.8–10.6)

## 2017-05-30 MED ORDER — SODIUM CHLORIDE 0.9 % IV BOLUS (SEPSIS)
500.0000 mL | Freq: Once | INTRAVENOUS | Status: AC
  Administered 2017-05-31: 500 mL via INTRAVENOUS

## 2017-05-30 MED ORDER — CEFTRIAXONE SODIUM 1 G IJ SOLR
1.0000 g | Freq: Once | INTRAMUSCULAR | Status: AC
  Administered 2017-05-31: 1 g via INTRAVENOUS
  Filled 2017-05-30: qty 10

## 2017-05-30 NOTE — ED Triage Notes (Signed)
Pt ambulatory to triage with aid of can, no difficulty or distress noted; c/o right flank pain with urinary frequency and dysuria; st pain nonradiating

## 2017-05-30 NOTE — ED Notes (Signed)
Patient transported to CT 

## 2017-05-31 DIAGNOSIS — N12 Tubulo-interstitial nephritis, not specified as acute or chronic: Secondary | ICD-10-CM | POA: Diagnosis present

## 2017-05-31 LAB — BASIC METABOLIC PANEL
ANION GAP: 10 (ref 5–15)
BUN: 19 mg/dL (ref 6–20)
CALCIUM: 8.5 mg/dL — AB (ref 8.9–10.3)
CO2: 21 mmol/L — AB (ref 22–32)
Chloride: 107 mmol/L (ref 101–111)
Creatinine, Ser: 0.95 mg/dL (ref 0.61–1.24)
GFR calc non Af Amer: >60 mL/min (ref >60)
Glucose, Bld: 119 mg/dL — ABNORMAL HIGH (ref 65–99)
POTASSIUM: 3.6 mmol/L (ref 3.5–5.1)
Sodium: 138 mmol/L (ref 135–145)

## 2017-05-31 LAB — CBC
HEMATOCRIT: 40.5 % (ref 40.0–52.0)
Hemoglobin: 13.5 g/dL (ref 13.0–18.0)
MCH: 29.6 pg (ref 26.0–34.0)
MCHC: 33.4 g/dL (ref 32.0–36.0)
MCV: 88.6 fL (ref 80.0–100.0)
PLATELETS: 213 10*3/uL (ref 150–440)
RBC: 4.57 MIL/uL (ref 4.40–5.90)
RDW: 14.9 % — AB (ref 11.5–14.5)
WBC: 12.1 10*3/uL — AB (ref 3.8–10.6)

## 2017-05-31 LAB — MAGNESIUM: Magnesium: 1.9 mg/dL (ref 1.7–2.4)

## 2017-05-31 LAB — PHOSPHORUS: PHOSPHORUS: 3.3 mg/dL (ref 2.5–4.6)

## 2017-05-31 MED ORDER — MAGNESIUM CITRATE PO SOLN
1.0000 | Freq: Once | ORAL | Status: DC | PRN
  Filled 2017-05-31: qty 296

## 2017-05-31 MED ORDER — ACETAMINOPHEN 325 MG PO TABS
650.0000 mg | ORAL_TABLET | Freq: Four times a day (QID) | ORAL | Status: DC | PRN
Start: 2017-05-31 — End: 2017-06-01

## 2017-05-31 MED ORDER — IPRATROPIUM BROMIDE 0.02 % IN SOLN: 0.5000 mg | Freq: Four times a day (QID) | RESPIRATORY_TRACT | Status: DC | PRN

## 2017-05-31 MED ORDER — ONDANSETRON HCL 4 MG PO TABS: 4.0000 mg | ORAL_TABLET | Freq: Four times a day (QID) | ORAL | Status: DC | PRN

## 2017-05-31 MED ORDER — KETOROLAC TROMETHAMINE 30 MG/ML IJ SOLN
30.0000 mg | Freq: Once | INTRAMUSCULAR | Status: AC
  Administered 2017-05-31: 30 mg via INTRAVENOUS
  Filled 2017-05-31: qty 1

## 2017-05-31 MED ORDER — LISINOPRIL-HYDROCHLOROTHIAZIDE 10-12.5 MG PO TABS: 0.5000 | ORAL_TABLET | Freq: Every day | ORAL | Status: DC

## 2017-05-31 MED ORDER — SODIUM CHLORIDE 0.9 % IV SOLN
INTRAVENOUS | Status: DC
  Administered 2017-05-31 – 2017-06-01 (×3): via INTRAVENOUS

## 2017-05-31 MED ORDER — BISACODYL 5 MG PO TBEC: 5.0000 mg | DELAYED_RELEASE_TABLET | Freq: Every day | ORAL | Status: DC | PRN

## 2017-05-31 MED ORDER — SENNOSIDES-DOCUSATE SODIUM 8.6-50 MG PO TABS: 1.0000 | ORAL_TABLET | Freq: Every evening | ORAL | Status: DC | PRN

## 2017-05-31 MED ORDER — ALBUTEROL SULFATE (2.5 MG/3ML) 0.083% IN NEBU: 2.5000 mg | INHALATION_SOLUTION | Freq: Four times a day (QID) | RESPIRATORY_TRACT | Status: DC | PRN

## 2017-05-31 MED ORDER — OXYCODONE HCL 5 MG PO TABS: 5.0000 mg | ORAL_TABLET | ORAL | Status: DC | PRN

## 2017-05-31 MED ORDER — TAMSULOSIN HCL 0.4 MG PO CAPS
0.4000 mg | ORAL_CAPSULE | Freq: Every day | ORAL | Status: DC
  Administered 2017-05-31: 0.4 mg via ORAL
  Filled 2017-05-31: qty 1

## 2017-05-31 MED ORDER — ENOXAPARIN SODIUM 40 MG/0.4ML ~~LOC~~ SOLN
40.0000 mg | SUBCUTANEOUS | Status: DC
  Administered 2017-05-31: 40 mg via SUBCUTANEOUS
  Filled 2017-05-31: qty 0.4

## 2017-05-31 MED ORDER — HYDROCHLOROTHIAZIDE 10 MG/ML ORAL SUSPENSION
6.2500 mg | Freq: Every day | ORAL | Status: DC
  Administered 2017-05-31 – 2017-06-01 (×2): 6.25 mg via ORAL
  Filled 2017-05-31 (×3): qty 1.25

## 2017-05-31 MED ORDER — ACETAMINOPHEN 650 MG RE SUPP: 650.0000 mg | Freq: Four times a day (QID) | RECTAL | Status: DC | PRN

## 2017-05-31 MED ORDER — FINASTERIDE 5 MG PO TABS
5.0000 mg | ORAL_TABLET | Freq: Every day | ORAL | Status: DC
  Administered 2017-05-31 – 2017-06-01 (×2): 5 mg via ORAL
  Filled 2017-05-31 (×2): qty 1

## 2017-05-31 MED ORDER — LISINOPRIL 10 MG PO TABS
5.0000 mg | ORAL_TABLET | Freq: Every day | ORAL | Status: DC
  Administered 2017-05-31 – 2017-06-01 (×2): 5 mg via ORAL
  Filled 2017-05-31 (×2): qty 1

## 2017-05-31 MED ORDER — ONDANSETRON HCL 4 MG/2ML IJ SOLN: 4.0000 mg | Freq: Four times a day (QID) | INTRAMUSCULAR | Status: DC | PRN

## 2017-05-31 MED ORDER — DEXTROSE 5 % IV SOLN
2.0000 g | INTRAVENOUS | Status: DC
  Administered 2017-05-31: 2 g via INTRAVENOUS
  Filled 2017-05-31 (×2): qty 2

## 2017-05-31 NOTE — Evaluation (Signed)
Physical Therapy Evaluation Patient Details Name: Vincent Poole MRN: [id number] DOB: 1934-12-15 Today's Date: 05/31/2017   History of Present Illness  Pt is a 82 y/o M who presented for evaluation of flank pain.  Pt admitted with R sided pyelonephritis/cystitis.  Pt's PMH includes prostate cancer.    Clinical Impression  Pt admitted with above diagnosis. Pt currently with functional limitations due to the deficits listed below (see PT Problem List). [name] ambulates with SPC at baseline and denies any falls in the past 6 months.  He demonstrates instability when initially standing from bed (does not require physical assist to stead) but otherwise demonstrates ability to ambulate 80 ft with SPC with no LOB.  Encouraged pt to ambulate with nursing staff while in hospital.  Pt will benefit from skilled PT to increase their independence and safety with mobility to allow discharge to the venue listed below.      Follow Up Recommendations Outpatient PT    Equipment Recommendations  None recommended by PT    Recommendations for Other Services       Precautions / Restrictions Precautions Precautions: Fall Restrictions Weight Bearing Restrictions: No      Mobility  Bed Mobility Overal bed mobility: Modified Independent             General bed mobility comments: No assist or cues needed.  Pt requires increased time for supine>sit.   Transfers Overall transfer level: Needs assistance Equipment used: Straight cane Transfers: Sit to/from Stand Sit to Stand: Min guard         General transfer comment: Pt unsteady with sit>stand form bed with LOB but is able to stabilize without outside physical assist.  No instability noted with stand>sit.  Ambulation/Gait Ambulation/Gait assistance: Min guard Ambulation Distance (Feet): 80 Feet Assistive device: Straight cane Gait Pattern/deviations: Step-through pattern;Trunk flexed Gait velocity: mildly decreased Gait velocity  interpretation: Below normal speed for age/gender General Gait Details: Slightly flexed posture but pt ambulates at mildly decreased speed but is steady.  Min guard for safety.  No LOB.  Stairs            Wheelchair Mobility    Modified Rankin (Stroke Patients Only)       Balance Overall balance assessment: Needs assistance Sitting-balance support: No upper extremity supported;Feet supported Sitting balance-Leahy Scale: Good     Standing balance support: No upper extremity supported;During functional activity Standing balance-Leahy Scale: Fair Standing balance comment: Pt uses SPC for stability with static and dynamic activities.  He is able to stand without UE support while toileting.                             Pertinent Vitals/Pain Pain Assessment: No/denies pain    Home Living Family/patient expects to be discharged to:: Private residence Living Arrangements: Spouse/significant other Available Help at Discharge: Family;Available PRN/intermittently Type of Home: Mobile home Home Access: Stairs to enter Entrance Stairs-Rails: Left Entrance Stairs-Number of Steps: 2 Home Layout: One level Home Equipment: Cane - single point;Walker - 2 wheels      Prior Function Level of Independence: Independent with assistive device(s)         Comments: Ambulates with SPC and denies any falls in the past 6 months.  Pt independent with bathing, dressing, driving.     Hand Dominance   Dominant Hand: Right    Extremity/Trunk Assessment   Upper Extremity Assessment Upper Extremity Assessment: Overall WFL for tasks assessed  Lower Extremity Assessment Lower Extremity Assessment:  (BLE strength grossly 4/5)    Cervical / Trunk Assessment Cervical / Trunk Assessment: Kyphotic  Communication   Communication: HOH (hearing better in L ear)  Cognition Arousal/Alertness: Awake/alert Behavior During Therapy: WFL for tasks assessed/performed Overall Cognitive  Status: Within Functional Limits for tasks assessed                                        General Comments General comments (skin integrity, edema, etc.): Wife present during Evaluation.    Exercises     Assessment/Plan    PT Assessment Patient needs continued PT services  PT Problem List Decreased strength;Decreased balance;Decreased safety awareness       PT Treatment Interventions DME instruction;Gait training;Stair training;Functional mobility training;Therapeutic activities;Therapeutic exercise;Balance training;Neuromuscular re-education;Patient/family education    PT Goals (Current goals can be found in the Care Plan section)  Acute Rehab PT Goals Patient Stated Goal: to go home today PT Goal Formulation: With patient Time For Goal Achievement: 06/14/17 Potential to Achieve Goals: Good    Frequency Min 2X/week   Barriers to discharge        Co-evaluation               AM-PAC PT "6 Clicks" Daily Activity  Outcome Measure Difficulty turning over in bed (including adjusting bedclothes, sheets and blankets)?: None Difficulty moving from lying on back to sitting on the side of the bed? : A Little Difficulty sitting down on and standing up from a chair with arms (e.g., wheelchair, bedside commode, etc,.)?: A Little Help needed moving to and from a bed to chair (including a wheelchair)?: A Little Help needed walking in hospital room?: A Little Help needed climbing 3-5 steps with a railing? : A Little 6 Click Score: 19    End of Session Equipment Utilized During Treatment: Gait belt Activity Tolerance: Patient tolerated treatment well Patient left: in chair;with call bell/phone within reach;with chair alarm set;with family/visitor present Nurse Communication: Mobility status PT Visit Diagnosis: Unsteadiness on feet (R26.81);Muscle weakness (generalized) (M62.81)    Time: 6384-6659 PT Time Calculation (min) (ACUTE ONLY): 23 min   Charges:    PT Evaluation $PT Eval Low Complexity: 1 Procedure PT Treatments $Gait Training: 8-22 mins   PT G Codes:        Collie Siad PT, DPT 05/31/2017, 1:18 PM

## 2017-05-31 NOTE — Progress Notes (Signed)
Pharmacy Antibiotic Note  Vincent Poole is a 82 y.o. male admitted on 05/30/2017 with UTI.  Pharmacy has been consulted for ceftriaxone dosing.  Plan: Ceftriaxone 2 grams q 24 hours ordered.  Height: 5\' 11"  (180.3 cm) Weight: 200 lb (90.7 kg) IBW/kg (Calculated) : 75.3  Temp (24hrs), Avg:98 F (36.7 C), Min:98 F (36.7 C), Max:98 F (36.7 C)   Recent Labs Lab 05/30/17 2058  WBC 13.0*  CREATININE 1.11    Estimated Creatinine Clearance: 61.2 mL/min (by C-G formula based on SCr of 1.11 mg/dL).    No Known Allergies  Antimicrobials this admission: ceftriaxone 7/25 >>    >>   Dose adjustments this admission:   Microbiology results: 7/24 BCx: pending 7/24 UCx: pending       7/24 UA: LE(+) NO2(+) WBC TNTC Thank you for allowing pharmacy to be a part of this patient's care.  Shaneen Reeser S 05/31/2017 2:16 AM

## 2017-05-31 NOTE — H&P (Signed)
History and Physical   [facility] - [place] @ [facility] Admission History and Physical [employer], D.O.    Patient Name: Vincent Poole MR#: [id number] Date of Birth: [birth year] Date of Admission: 05/30/2017  Referring MD/NP/PA: Dr. Dahlia Client Primary Care Physician: [name], MD  Chief Complaint:  Chief Complaint  Patient presents with  . Flank Pain    HPI: Vincent Poole is a 82 y.o. male with a known history of anxiety, arthritis, prostate cancer presents to the emergency department for evaluation of flank pain which began a few days ago.  Patient Wears depends for urinary incontinence. He has had multiple issues with urinary tract infections and pilonidal in the past. Patient denies fevers/chills, weakness, dizziness, chest pain, shortness of breath, N/V/C/D, dysuria/frequency, changes in mental status.    Otherwise there has been no change in status. Patient has been taking medication as prescribed and there has been no recent change in medication or diet.  No recent antibiotics.  There has been no recent illness, hospitalizations, travel or sick contacts.    EMS/ED Course: Patient received Rocephin, NS, Toradol. Medical admission has been requested for further management of Pyelonephritis.  Review of Systems:  CONSTITUTIONAL: No fever/chills, fatigue, weakness, weight gain/loss, headache. EYES: No blurry or double vision. ENT: No tinnitus, postnasal drip, redness or soreness of the oropharynx. RESPIRATORY: No cough, dyspnea, wheeze.  No hemoptysis.  CARDIOVASCULAR: No chest pain, palpitations, syncope, orthopnea. No lower extremity edema.  GASTROINTESTINAL: No nausea, vomiting, abdominal pain, diarrhea, constipation.  No hematemesis, melena or hematochezia. GENITOURINARY: Positive flank pain. No dysuria, frequency, hematuria. ENDOCRINE: No polyuria or nocturia. No heat or cold intolerance. HEMATOLOGY: No anemia, bruising, bleeding. INTEGUMENTARY: No  rashes, ulcers, lesions. MUSCULOSKELETAL: No arthritis, gout, dyspnea. NEUROLOGIC: No numbness, tingling, ataxia, seizure-type activity, weakness. PSYCHIATRIC: No anxiety, depression, insomnia.   Past Medical History:  Diagnosis Date  . Anxiety   . Arthritis   . Prostate cancer [facility])     Past Surgical History:  Procedure Laterality Date  . APPENDECTOMY    . CHOLECYSTECTOMY       reports that he has never smoked. He has never used smokeless tobacco. He reports that he does not drink alcohol or use drugs.  No Known Allergies  Family History  Problem Relation Age of Onset  . Cancer Mother   . Chronic Renal Failure Mother   . Heart disease Father     Prior to Admission medications   Medication Sig Start Date End Date Taking? Authorizing Provider  acetaminophen (TYLENOL) 500 MG tablet Take 500 mg by mouth every 6 (six) hours as needed.    [provider]  finasteride (PROSCAR) 5 MG tablet Take 1 tablet (5 mg total) by mouth daily. 11/28/16   Ardis Hughs, MD  lisinopril-hydrochlorothiazide (PRINZIDE,ZESTORETIC) 10-12.5 MG per tablet Take 0.5 tablets by mouth daily.  07/03/12   [provider]  tamsulosin (FLOMAX) 0.4 MG CAPS capsule Take 1 capsule (0.4 mg total) by mouth daily after supper. 11/28/16   Ardis Hughs, MD    Physical Exam: Vitals:   05/30/17 2100 05/30/17 2330 05/31/17 0023  BP: 132/72 (!) 143/100 (!) 117/91  Pulse: (!) 110  95  Resp: 20  18  Temp: 98 F (36.7 C)    TempSrc: Oral    SpO2: 95%  99%  Weight: 90.7 kg (200 lb)    Height: 5\' 11"  (1.803 m)      GENERAL: 82 y.o.-year-old White male patient, well-developed, well-nourished lying in the bed  in no acute distress.  Pleasant and cooperative.   HEENT: Head atraumatic, normocephalic. Pupils equal. Mucus membranes moist. NECK: Supple, full range of motion. No JVD, no bruit heard. No thyroid enlargement, no tenderness, no cervical lymphadenopathy. CHEST: Normal breath sounds  bilaterally. No wheezing, rales, rhonchi or crackles. No use of accessory muscles of respiration.  No reproducible chest wall tenderness.  CARDIOVASCULAR: S1, S2 normal. No murmurs, rubs, or gallops. Cap refill <2 seconds. Pulses intact distally.  ABDOMEN: Soft, nondistended, nontender. No rebound, guarding, rigidity. Normoactive bowel sounds present in all four quadrants. Right-sided CVA tenderness and mild superpubic tenderness. EXTREMITIES: No pedal edema, cyanosis, or clubbing. No calf tenderness or Homan's sign.  NEUROLOGIC: The patient is alert and oriented x 3. Cranial nerves II through XII are grossly intact with no focal sensorimotor deficit. PSYCHIATRIC:  Normal affect, mood, thought content. SKIN: Warm, dry, and intact without obvious rash, lesion, or ulcer.    Labs on Admission:  CBC:  Recent Labs Lab 05/30/17 2058  WBC 13.0*  HGB 13.6  HCT 40.1  MCV 86.2  PLT 812   Basic Metabolic Panel:  Recent Labs Lab 05/30/17 2058  NA 137  K 3.7  CL 105  CO2 23  GLUCOSE 129*  BUN 19  CREATININE 1.11  CALCIUM 9.0   GFR: Estimated Creatinine Clearance: 61.2 mL/min (by C-G formula based on SCr of 1.11 mg/dL). Liver Function Tests: No results for input(s): AST, ALT, ALKPHOS, BILITOT, PROT, ALBUMIN in the last 168 hours. No results for input(s): LIPASE, AMYLASE in the last 168 hours. No results for input(s): AMMONIA in the last 168 hours. Coagulation Profile: No results for input(s): INR, PROTIME in the last 168 hours. Cardiac Enzymes: No results for input(s): CKTOTAL, CKMB, CKMBINDEX, TROPONINI in the last 168 hours. BNP (last 3 results) No results for input(s): PROBNP in the last 8760 hours. HbA1C: No results for input(s): HGBA1C in the last 72 hours. CBG: No results for input(s): GLUCAP in the last 168 hours. Lipid Profile: No results for input(s): CHOL, HDL, LDLCALC, TRIG, CHOLHDL, LDLDIRECT in the last 72 hours. Thyroid Function Tests: No results for input(s):  TSH, T4TOTAL, FREET4, T3FREE, THYROIDAB in the last 72 hours. Anemia Panel: No results for input(s): VITAMINB12, FOLATE, FERRITIN, TIBC, IRON, RETICCTPCT in the last 72 hours. Urine analysis:    Component Value Date/Time   COLORURINE AMBER (A) 05/30/2017 2058   APPEARANCEUR TURBID (A) 05/30/2017 2058   LABSPEC 1.020 05/30/2017 2058   PHURINE 5.0 05/30/2017 2058   GLUCOSEU NEGATIVE 05/30/2017 2058   HGBUR SMALL (A) 05/30/2017 2058   BILIRUBINUR NEGATIVE 05/30/2017 2058   [place] NEGATIVE 05/30/2017 2058   PROTEINUR 100 (A) 05/30/2017 2058   NITRITE POSITIVE (A) 05/30/2017 2058   LEUKOCYTESUR MODERATE (A) 05/30/2017 2058   Sepsis Labs: @LABRCNTIP (procalcitonin:4,lacticidven:4) )No results found for this or any previous visit (from the past 240 hour(s)).   Radiological Exams on Admission: Ct Renal Stone Study  Result Date: 05/31/2017 CLINICAL DATA:  Right flank pain.  UTI.  Concern for stone. EXAM: CT ABDOMEN AND PELVIS WITHOUT CONTRAST TECHNIQUE: Multidetector CT imaging of the abdomen and pelvis was performed following the standard protocol without IV contrast. COMPARISON:  None. FINDINGS: Lower chest: Dense coronary artery calcifications or stents. Hypoventilatory atelectasis. No pleural fluid. Hepatobiliary: No focal lesion allowing for lack contrast. Gallbladder is surgically absent. No biliary dilatation. Pancreas: A few scattered parenchymal calcifications. No ductal dilatation or inflammation. Spleen: Normal in size without focal abnormality. Adrenals/Urinary Tract: Mild thickening of the  adrenal glands without nodule. Moderate right hydroureteronephrosis. The ureter is dilated to the bladder insertion. There is significant periureteric soft tissue edema distally, lesser periureteric and perinephric edema proximally. No urolithiasis. 3 cm cyst in the upper right kidney. No left hydronephrosis. No left urolithiasis. The left ureter is decompressed. There is mild distal left  periureteric soft tissue stranding. Urinary bladder is minimally distended, moderate wall thickening and perivesicular edema. Stomach/Bowel: Small hiatal hernia. Stomach is decompressed. Small duodenum diverticulum. No bowel obstruction. No evidence bowel wall thickening. Sigmoid colon is tortuous. Scattered descending and sigmoid colonic diverticulosis without acute diverticulitis. The appendix is air-filled and normal, despite reported history of appendectomy in the electronic medical record. Vascular/Lymphatic: Moderate aortic atherosclerosis. No aneurysm. Small retroperitoneal lymph nodes are likely reactive. No pelvic adenopathy. Reproductive: Brachytherapy seeds in the prostate. Other: Small fat containing umbilical hernia. Small fat containing left inguinal hernia. No free air, free fluid, or intra-abdominal fluid collection. Musculoskeletal: Bones are under mineralized. Advanced degenerative change of both hips, right greater than left. Diffuse degenerative change throughout spine. IMPRESSION: 1. No urolithiasis. There is marked bladder wall thickening and perivesicular edema, with ascending inflammation but the right greater than left ureter. Inflammatory change in the right extends to the kidney with moderate right hydronephrosis and perinephric soft tissue stranding. Findings consistent with cystitis and pyelonephritis. 2. Colonic diverticulosis without acute diverticulitis. 3.  Aortic Atherosclerosis (ICD10-I70.0). Electronically Signed   By: Jeb Levering M.D.   On: 05/31/2017 00:00    Assessment/Plan  This is a 82 y.o. male with a history of anxiety, arthritis, prostate cancer, hypertension now being admitted with:  #. Right sided pyelonephritis/cystitis -Admit to inpatient -IV fluid hydration -IV Rocephin -Follow-up cultures -Pain control  #. History of BPH -Continue Proscar, Flomax  #. History of hypertension -Continue lisinopril and hydrochlorothiazide  Admission status:  Inpatient IV Fluids: Normal saline Diet/Nutrition: Heart healthy Consults called: None  DVT Px: Lovenox, SCDs and early ambulation. Code Status: Full Code  Disposition Plan: To home in 1-2 days  All the records are reviewed and case discussed with ED provider. Management plans discussed with the patient and/or family who express understanding and agree with plan of care.  Torry Adamczak D.O. on 05/31/2017 at 12:57 AM Between 7am to 6pm - Pager - [phone] After 6pm go to [email or web address] - [employer] [facility] [place] [facility] Office [phone] CC: Primary care physician; [name], MD   05/31/2017, 12:57 AM

## 2017-05-31 NOTE — Progress Notes (Signed)
Patient continues to make frequent usage of urinal or goes to bathroom to void. However, after voiding bladder scan results in less than 50 mL. Patient output only 10-50ML

## 2017-05-31 NOTE — ED Notes (Signed)
Report called to [facility] rn floor nurse

## 2017-05-31 NOTE — ED Provider Notes (Signed)
[facility] Emergency Department Provider Note   ____________________________________________   First MD Initiated Contact with Patient 05/30/17 2321     (approximate)  I have reviewed the triage vital signs and the nursing notes.   HISTORY  Chief Complaint Flank Pain    HPI Vincent Poole is a 82 y.o. male who comes into the hospital today with some pain in his kidney on the right side. He reports that he started having some pain several days ago and it's been getting worse and worse. The patient has been taking Tylenol but has not been helping much. He does have some occasional pain with urination and reports that he feels a strain when he goes to the bathroom. He is also been having some frequency and urgency. The patient has not had any fevers but has had some chills with nausea and no vomiting. He initially thought he had a stomach but but again the pain has been persistent. He didn't eat much today. The patient rates his pain a 7 out of 10 in intensity. Occasionally jumps up to attend. He's never had pain like this before but has had sepsis from UTI in the past and has had frequent UTIs. He is here today for evaluation.   Past Medical History:  Diagnosis Date  . Anxiety   . Arthritis   . Prostate cancer [facility])     Patient Active Problem List   Diagnosis Date Noted  . Pyelonephritis 05/31/2017  . UTI (urinary tract infection) 03/04/2017  . Urinary obstruction   . Essential hypertension 08/30/2016  . History of shingles 08/30/2016  . Prostate cancer ([place]) 08/30/2016  . Urinary retention 08/30/2016  . Medicare annual wellness visit, initial 08/01/2016  . Medicare annual wellness visit, subsequent 08/01/2016  . Sepsis ([place]) 07/11/2016  . Borderline diabetes mellitus 01/26/2016  . Vaccine counseling 01/26/2015  . Lumbar radiculitis 06/24/2014  . OA (osteoarthritis) of hip 06/24/2014  . Malignant neoplasm of prostate ([place]) 01/27/2011    Past  Surgical History:  Procedure Laterality Date  . APPENDECTOMY    . CHOLECYSTECTOMY      Prior to Admission medications   Medication Sig Start Date End Date Taking? Authorizing Provider  acetaminophen (TYLENOL) 500 MG tablet Take 500 mg by mouth every 6 (six) hours as needed.   Yes [provider]  finasteride (PROSCAR) 5 MG tablet Take 1 tablet (5 mg total) by mouth daily. 11/28/16  Yes Ardis Hughs, MD  lisinopril-hydrochlorothiazide (PRINZIDE,ZESTORETIC) 10-12.5 MG per tablet Take 0.5 tablets by mouth daily.  07/03/12  Yes [provider]  tamsulosin (FLOMAX) 0.4 MG CAPS capsule Take 1 capsule (0.4 mg total) by mouth daily after supper. 11/28/16  Yes Ardis Hughs, MD    Allergies Patient has no known allergies.  Family History  Problem Relation Age of Onset  . Cancer Mother   . Chronic Renal Failure Mother   . Heart disease Father     Social History Social History  Substance Use Topics  . Smoking status: Never Smoker  . Smokeless tobacco: Never Used  . Alcohol use No    Review of Systems  Constitutional: chills Eyes: No visual changes. ENT: No sore throat. Cardiovascular: Denies chest pain. Respiratory: Denies shortness of breath. Gastrointestinal:  abdominal pain.  No nausea, no vomiting.  No diarrhea.  No constipation. Genitourinary: Frequency and urgency Musculoskeletal:  back pain. Skin: Negative for rash. Neurological: Negative for headaches, focal weakness or numbness.   ____________________________________________   PHYSICAL  EXAM:  VITAL SIGNS: ED Triage Vitals [05/30/17 2100]  Enc Vitals Group     BP 132/72     Pulse Rate (!) 110     Resp 20     Temp 98 F (36.7 C)     Temp Source Oral     SpO2 95 %     Weight 200 lb (90.7 kg)     Height 5\' 11"  (1.803 m)     Head Circumference      Peak Flow      Pain Score 5     Pain Loc      Pain Edu?      Excl. in [place]?     Constitutional: Alert and oriented. Well appearing  and in Moderate distress. The patient is shaking on my exam Eyes: Conjunctivae are normal. PERRL. EOMI. Head: Atraumatic. Nose: No congestion/rhinnorhea. Mouth/Throat: Mucous membranes are moist.  Oropharynx non-erythematous. Cardiovascular: Normal rate, regular rhythm. Grossly normal heart sounds.  Good peripheral circulation. Respiratory: Normal respiratory effort.  No retractions. Lungs CTAB. Gastrointestinal: Soft and nontender. No distention. Positive bowel sounds. No CVA tenderness. Musculoskeletal: No lower extremity tenderness nor edema.   Neurologic:  Normal speech and language.  Skin:  Skin is warm, dry and intact.  Psychiatric: Mood and affect are normal.   ____________________________________________   LABS (all labs ordered are listed, but only abnormal results are displayed)  Labs Reviewed  BASIC METABOLIC PANEL - Abnormal; Notable for the following:       Result Value   Glucose, Bld 129 (*)    All other components within normal limits  CBC - Abnormal; Notable for the following:    WBC 13.0 (*)    RDW 14.9 (*)    All other components within normal limits  URINALYSIS, COMPLETE (UACMP) WITH MICROSCOPIC - Abnormal; Notable for the following:    Color, Urine AMBER (*)    APPearance TURBID (*)    Hgb urine dipstick SMALL (*)    Protein, ur 100 (*)    Nitrite POSITIVE (*)    Leukocytes, UA MODERATE (*)    Bacteria, UA FEW (*)    Non Squamous Epithelial 0-5 (*)    All other components within normal limits  BASIC METABOLIC PANEL - Abnormal; Notable for the following:    CO2 21 (*)    Glucose, Bld 119 (*)    Calcium 8.5 (*)    All other components within normal limits  CULTURE, BLOOD (ROUTINE X 2)  CULTURE, BLOOD (ROUTINE X 2)  URINE CULTURE  MAGNESIUM  PHOSPHORUS  CBC   ____________________________________________  EKG  none ____________________________________________  RADIOLOGY  Ct Renal Stone Study  Result Date: 05/31/2017 CLINICAL DATA:  Right  flank pain.  UTI.  Concern for stone. EXAM: CT ABDOMEN AND PELVIS WITHOUT CONTRAST TECHNIQUE: Multidetector CT imaging of the abdomen and pelvis was performed following the standard protocol without IV contrast. COMPARISON:  None. FINDINGS: Lower chest: Dense coronary artery calcifications or stents. Hypoventilatory atelectasis. No pleural fluid. Hepatobiliary: No focal lesion allowing for lack contrast. Gallbladder is surgically absent. No biliary dilatation. Pancreas: A few scattered parenchymal calcifications. No ductal dilatation or inflammation. Spleen: Normal in size without focal abnormality. Adrenals/Urinary Tract: Mild thickening of the adrenal glands without nodule. Moderate right hydroureteronephrosis. The ureter is dilated to the bladder insertion. There is significant periureteric soft tissue edema distally, lesser periureteric and perinephric edema proximally. No urolithiasis. 3 cm cyst in the upper right kidney. No left hydronephrosis. No left urolithiasis. The left  ureter is decompressed. There is mild distal left periureteric soft tissue stranding. Urinary bladder is minimally distended, moderate wall thickening and perivesicular edema. Stomach/Bowel: Small hiatal hernia. Stomach is decompressed. Small duodenum diverticulum. No bowel obstruction. No evidence bowel wall thickening. Sigmoid colon is tortuous. Scattered descending and sigmoid colonic diverticulosis without acute diverticulitis. The appendix is air-filled and normal, despite reported history of appendectomy in the electronic medical record. Vascular/Lymphatic: Moderate aortic atherosclerosis. No aneurysm. Small retroperitoneal lymph nodes are likely reactive. No pelvic adenopathy. Reproductive: Brachytherapy seeds in the prostate. Other: Small fat containing umbilical hernia. Small fat containing left inguinal hernia. No free air, free fluid, or intra-abdominal fluid collection. Musculoskeletal: Bones are under mineralized. Advanced  degenerative change of both hips, right greater than left. Diffuse degenerative change throughout spine. IMPRESSION: 1. No urolithiasis. There is marked bladder wall thickening and perivesicular edema, with ascending inflammation but the right greater than left ureter. Inflammatory change in the right extends to the kidney with moderate right hydronephrosis and perinephric soft tissue stranding. Findings consistent with cystitis and pyelonephritis. 2. Colonic diverticulosis without acute diverticulitis. 3.  Aortic Atherosclerosis (ICD10-I70.0). Electronically Signed   By: Jeb Levering M.D.   On: 05/31/2017 00:00    ____________________________________________   PROCEDURES  Procedure(s) performed: None  Procedures  Critical Care performed: No  ____________________________________________   INITIAL IMPRESSION / ASSESSMENT AND PLAN / ED COURSE  Pertinent labs & imaging results that were available during my care of the patient were reviewed by me and considered in my medical decision making (see chart for details).  This is an 82 year old male who comes into the hospital today with some flank pain. The patient's urine shows some significant urinary tract infection. He has some positive nitrates with some too numerous to count white blood cells and red blood cells. Given the patient's pain on the right I did send him for a CT scan to ensure that he did not have any kidney stones. He does not have any stones but he does have some significant wall thickening with inflammation ascending to the right greater than left ureter. He does have some mild right hydroureter and some perinephric soft tissue stranding. I am concerned that the patient does have 5 and nephritis. I did give him some normal saline as well as some ceftriaxone. Given the patient's history of sepsis I decided to admit the patient for IV antibiotics and fluids. I did give the patient a shot of Toradol which did help with the chills  and the pain that he was having. The patient will be admitted to the hospitalist service for pyelonephritis.      ____________________________________________   FINAL CLINICAL IMPRESSION(S) / ED DIAGNOSES  Final diagnoses:  Pyelonephritis      NEW MEDICATIONS STARTED DURING THIS VISIT:  Current Discharge Medication List       Note:  This document was prepared using Dragon voice recognition software and may include unintentional dictation errors.    Loney Hering, MD 05/31/17 [phone]

## 2017-05-31 NOTE — Progress Notes (Signed)
[name] [place] NAME: Vincent Poole    MR#:  [id number]  DATE OF BIRTH:  [birth year]  SUBJECTIVE:  CHIEF COMPLAINT:   Chief Complaint  Patient presents with  . Flank Pain   -Admitted with back pain, nausea and diaphoresis. Noted to have urinary tract infection.  REVIEW OF SYSTEMS:  Review of Systems  Constitutional: Negative for chills, fever and malaise/fatigue.  HENT: Positive for hearing loss.   Respiratory: Negative for cough, shortness of breath and wheezing.   Cardiovascular: Negative for chest pain, palpitations and leg swelling.  Gastrointestinal: Positive for nausea. Negative for abdominal pain, constipation, diarrhea and vomiting.  Genitourinary: Positive for urgency. Negative for dysuria.  Musculoskeletal: Positive for back pain. Negative for myalgias.  Neurological: Negative for dizziness, sensory change, speech change, focal weakness, seizures and headaches.  Psychiatric/Behavioral: Negative for depression.    DRUG ALLERGIES:  No Known Allergies  VITALS:  Blood pressure (!) 125/55, pulse 90, temperature 98.5 F (36.9 C), temperature source Oral, resp. rate 19, height 5\' 11"  (1.803 m), weight 90.7 kg (200 lb), SpO2 100 %.  PHYSICAL EXAMINATION:  Physical Exam  GENERAL:  82 y.o.-year-old patient lying in the bed with no acute distress. Very hard of hearing. EYES: Pupils equal, round, reactive to light and accommodation. No scleral icterus. Extraocular muscles intact.  HEENT: Head atraumatic, normocephalic. Oropharynx and nasopharynx clear.  NECK:  Supple, no jugular venous distention. No thyroid enlargement, no tenderness.  LUNGS: Normal breath sounds bilaterally, no wheezing, rales,rhonchi or crepitation. No use of accessory muscles of respiration. Decreased bibasilar breath sounds. CARDIOVASCULAR: S1, S2 normal. No murmurs, rubs, or gallops.  ABDOMEN: Soft, nontender, nondistended. Bowel sounds present. No  organomegaly or mass.  EXTREMITIES: No pedal edema, cyanosis, or clubbing.  NEUROLOGIC: Cranial nerves II through XII are intact. Muscle strength 5/5 in all extremities. Sensation intact. Gait not checked.  PSYCHIATRIC: The patient is alert and oriented x 2-3.  SKIN: No obvious rash, lesion, or ulcer.    LABORATORY PANEL:   CBC  Recent Labs Lab 05/31/17 0528  WBC 12.1*  HGB 13.5  HCT 40.5  PLT 213   ------------------------------------------------------------------------------------------------------------------  Chemistries   Recent Labs Lab 05/30/17 2058 05/31/17 0351  NA 137 138  K 3.7 3.6  CL 105 107  CO2 23 21*  GLUCOSE 129* 119*  BUN 19 19  CREATININE 1.11 0.95  CALCIUM 9.0 8.5*  MG 1.9  --    ------------------------------------------------------------------------------------------------------------------  Cardiac Enzymes No results for input(s): TROPONINI in the last 168 hours. ------------------------------------------------------------------------------------------------------------------  RADIOLOGY:  Ct Renal Stone Study  Result Date: 05/31/2017 CLINICAL DATA:  Right flank pain.  UTI.  Concern for stone. EXAM: CT ABDOMEN AND PELVIS WITHOUT CONTRAST TECHNIQUE: Multidetector CT imaging of the abdomen and pelvis was performed following the standard protocol without IV contrast. COMPARISON:  None. FINDINGS: Lower chest: Dense coronary artery calcifications or stents. Hypoventilatory atelectasis. No pleural fluid. Hepatobiliary: No focal lesion allowing for lack contrast. Gallbladder is surgically absent. No biliary dilatation. Pancreas: A few scattered parenchymal calcifications. No ductal dilatation or inflammation. Spleen: Normal in size without focal abnormality. Adrenals/Urinary Tract: Mild thickening of the adrenal glands without nodule. Moderate right hydroureteronephrosis. The ureter is dilated to the bladder insertion. There is significant periureteric  soft tissue edema distally, lesser periureteric and perinephric edema proximally. No urolithiasis. 3 cm cyst in the upper right kidney. No left hydronephrosis. No left urolithiasis. The left ureter is decompressed. There is mild distal left  periureteric soft tissue stranding. Urinary bladder is minimally distended, moderate wall thickening and perivesicular edema. Stomach/Bowel: Small hiatal hernia. Stomach is decompressed. Small duodenum diverticulum. No bowel obstruction. No evidence bowel wall thickening. Sigmoid colon is tortuous. Scattered descending and sigmoid colonic diverticulosis without acute diverticulitis. The appendix is air-filled and normal, despite reported history of appendectomy in the electronic medical record. Vascular/Lymphatic: Moderate aortic atherosclerosis. No aneurysm. Small retroperitoneal lymph nodes are likely reactive. No pelvic adenopathy. Reproductive: Brachytherapy seeds in the prostate. Other: Small fat containing umbilical hernia. Small fat containing left inguinal hernia. No free air, free fluid, or intra-abdominal fluid collection. Musculoskeletal: Bones are under mineralized. Advanced degenerative change of both hips, right greater than left. Diffuse degenerative change throughout spine. IMPRESSION: 1. No urolithiasis. There is marked bladder wall thickening and perivesicular edema, with ascending inflammation but the right greater than left ureter. Inflammatory change in the right extends to the kidney with moderate right hydronephrosis and perinephric soft tissue stranding. Findings consistent with cystitis and pyelonephritis. 2. Colonic diverticulosis without acute diverticulitis. 3.  Aortic Atherosclerosis (ICD10-I70.0). Electronically Signed   By: Jeb Levering M.D.   On: 05/31/2017 00:00    EKG:   Orders placed or performed during the hospital encounter of 05/30/17  . EKG 12-Lead    ASSESSMENT AND PLAN:   82y/o M with PMH significant for anxiety, prostate  cancer, arthritis admitted for nausea/urinary urgency and noted to have UTI  #1 Acute cystitis- f/u urine cultures and blood cultures - IV fluids, continue rocephin - Urinary tract infections recently-continue to follow up with urology as outpatient  #2 BPH-continue Proscar and Flomax -Discontinue Foley catheter and check post void residual and bladder scan  #3 hypertension-on lisinopril and hydrochlorothiazide  #4 DVT prophylaxis-on Lovenox    All the records are reviewed and case discussed with Care Management/Social Workerr. Management plans discussed with the patient, family and they are in agreement.  CODE STATUS: Full code  TOTAL TIME TAKING CARE OF THIS PATIENT: 37 minutes.   POSSIBLE D/C IN 1-2 DAYS, DEPENDING ON CLINICAL CONDITION.   Sophee Mckimmy M.D on 05/31/2017 at 2:38 PM  Between 7am to 6pm - Pager - [phone]  After 6pm go to [email or web address] - password [email or web address] [facility]  Office  [phone]  CC: Primary care physician; [name], MD

## 2017-06-01 LAB — CBC
HCT: 34 % — ABNORMAL LOW (ref 40.0–52.0)
HEMOGLOBIN: 11.7 g/dL — AB (ref 13.0–18.0)
MCH: 29.9 pg (ref 26.0–34.0)
MCHC: 34.4 g/dL (ref 32.0–36.0)
MCV: 86.9 fL (ref 80.0–100.0)
PLATELETS: 213 10*3/uL (ref 150–440)
RBC: 3.92 MIL/uL — AB (ref 4.40–5.90)
RDW: 14.7 % — ABNORMAL HIGH (ref 11.5–14.5)
WBC: 8 10*3/uL (ref 3.8–10.6)

## 2017-06-01 LAB — BASIC METABOLIC PANEL
ANION GAP: 7 (ref 5–15)
BUN: 17 mg/dL (ref 6–20)
CHLORIDE: 108 mmol/L (ref 101–111)
CO2: 23 mmol/L (ref 22–32)
Calcium: 8 mg/dL — ABNORMAL LOW (ref 8.9–10.3)
Creatinine, Ser: 0.99 mg/dL (ref 0.61–1.24)
GFR calc Af Amer: >60 mL/min (ref >60)
GLUCOSE: 94 mg/dL (ref 65–99)
POTASSIUM: 3 mmol/L — AB (ref 3.5–5.1)
SODIUM: 138 mmol/L (ref 135–145)

## 2017-06-01 MED ORDER — POTASSIUM CHLORIDE CRYS ER 20 MEQ PO TBCR
40.0000 meq | EXTENDED_RELEASE_TABLET | Freq: Once | ORAL | Status: AC
  Administered 2017-06-01: 40 meq via ORAL
  Filled 2017-06-01: qty 2

## 2017-06-01 MED ORDER — CEPHALEXIN 500 MG PO CAPS: 500.0000 mg | ORAL_CAPSULE | Freq: Three times a day (TID) | ORAL | 0 refills | Status: DC

## 2017-06-01 MED ORDER — POTASSIUM CHLORIDE CRYS ER 20 MEQ PO TBCR
40.0000 meq | EXTENDED_RELEASE_TABLET | Freq: Once | ORAL | Status: DC
  Filled 2017-06-01: qty 2

## 2017-06-01 NOTE — Care Management CC44 (Signed)
Condition Code 44 Documentation Completed  Patient Details  Name: Vincent Poole MRN: [id number] Date of Birth: [birth year]   Condition Code 44 given:  Yes Patient signature on Condition Code 44 notice:  Yes Documentation of 2 MD's agreement:    Code 44 added to claim:       Beverly Sessions, RN 06/01/2017, 2:58 PM

## 2017-06-01 NOTE — Care Management CC44 (Signed)
Condition Code 44 Documentation Completed  Patient Details  Name: Vincent Poole MRN: [id number] Date of Birth: [birth year]   Condition Code 44 given:  Yes Patient signature on Condition Code 44 notice:  Yes Documentation of 2 MD's agreement:  Yes Code 44 added to claim:  Yes    Darryll Capers, RN 06/01/2017, 5:15 PM

## 2017-06-01 NOTE — Discharge Summary (Signed)
[facility] [place] NAME: Vincent Poole    MR#:  [id number]  DATE OF BIRTH:  05-Jul-1935  DATE OF ADMISSION:  05/30/2017   ADMITTING PHYSICIAN: Harvie Bridge, DO  DATE OF DISCHARGE:  06/01/17  PRIMARY CARE PHYSICIAN: [name], MD   ADMISSION DIAGNOSIS:   Pyelonephritis [N12]  DISCHARGE DIAGNOSIS:   Active Problems:   Pyelonephritis   SECONDARY DIAGNOSIS:   Past Medical History:  Diagnosis Date  . Anxiety   . Arthritis   . Prostate cancer [facility])     HOSPITAL COURSE:   82y/o M with PMH significant for anxiety, prostate cancer, arthritis admitted for nausea/urinary urgency and noted to have UTI  #1 Acute cystitis- Blood cultures are negative, urine cultures growing gram-negative rods. -Responded well to IV Rocephin in the hospital. Afebrile and normalized white count today. -Changed to Keflex at discharge -Already following with urology as outpatient. Recommended close follow-up due to frequent UTIs, hesitancy and urgency.  #2 BPH-continue Proscar and Flomax -Discontinued Foley catheter and post void residuals have been less than 100 cc  #3 hypertension-on lisinopril and hydrochlorothiazide  #4 hypokalemia-replaced prior to discharge  Worked with physical therapy and they have recommended outpatient follow-up.  DISCHARGE CONDITIONS:   Guarded  CONSULTS OBTAINED:   None  DRUG ALLERGIES:   No Known Allergies DISCHARGE MEDICATIONS:   Allergies as of 06/01/2017   No Known Allergies     Medication List    TAKE these medications   acetaminophen 500 MG tablet Commonly known as:  TYLENOL Take 500 mg by mouth every 6 (six) hours as needed.   cephALEXin 500 MG capsule Commonly known as:  KEFLEX Take 1 capsule (500 mg total) by mouth 3 (three) times daily. X 5 more days   finasteride 5 MG tablet Commonly known as:  PROSCAR Take 1 tablet (5 mg total) by mouth daily.     lisinopril-hydrochlorothiazide 10-12.5 MG tablet Commonly known as:  PRINZIDE,ZESTORETIC Take 0.5 tablets by mouth daily.   tamsulosin 0.4 MG Caps capsule Commonly known as:  FLOMAX Take 1 capsule (0.4 mg total) by mouth daily after supper.        DISCHARGE INSTRUCTIONS:   1. Urology follow-up in March 2 weeks 2. PCP follow-up in 1 week  DIET:   Cardiac diet  ACTIVITY:   Activity as tolerated  OXYGEN:   Home Oxygen: No.  Oxygen Delivery: room air  DISCHARGE LOCATION:   home   If you experience worsening of your admission symptoms, develop shortness of breath, life threatening emergency, suicidal or homicidal thoughts you must seek medical attention immediately by calling 911 or calling your MD immediately  if symptoms less severe.  You Must read complete instructions/literature along with all the possible adverse reactions/side effects for all the Medicines you take and that have been prescribed to you. Take any new Medicines after you have completely understood and accpet all the possible adverse reactions/side effects.   Please note  You were cared for by a hospitalist during your hospital stay. If you have any questions about your discharge medications or the care you received while you were in the hospital after you are discharged, you can call the unit and asked to speak with the hospitalist on call if the hospitalist that took care of you is not available. Once you are discharged, your primary care physician will handle any further medical issues. Please note that NO REFILLS for any discharge medications will be authorized once  you are discharged, as it is imperative that you return to your primary care physician (or establish a relationship with a primary care physician if you do not have one) for your aftercare needs so that they can reassess your need for medications and monitor your lab values.    On the day of Discharge:  VITAL SIGNS:   Blood pressure (!)  102/55, pulse 87, temperature 98.2 F (36.8 C), temperature source Oral, resp. rate 17, height 5\' 11"  (1.803 m), weight 90.7 kg (200 lb), SpO2 97 %.  PHYSICAL EXAMINATION:    GENERAL:  82 y.o.-year-old patient lying in the bed with no acute distress. Very hard of hearing. EYES: Pupils equal, round, reactive to light and accommodation. No scleral icterus. Extraocular muscles intact.  HEENT: Head atraumatic, normocephalic. Oropharynx and nasopharynx clear.  NECK:  Supple, no jugular venous distention. No thyroid enlargement, no tenderness.  LUNGS: Normal breath sounds bilaterally, no wheezing, rales,rhonchi or crepitation. No use of accessory muscles of respiration. Decreased bibasilar breath sounds. CARDIOVASCULAR: S1, S2 normal. No murmurs, rubs, or gallops.  ABDOMEN: Soft, nontender, nondistended. Bowel sounds present. No organomegaly or mass.  EXTREMITIES: No pedal edema, cyanosis, or clubbing.  NEUROLOGIC: Cranial nerves II through XII are intact. Muscle strength 5/5 in all extremities. Sensation intact. Gait not checked.  PSYCHIATRIC: The patient is alert and oriented x 3.  SKIN: No obvious rash, lesion, or ulcer.    DATA REVIEW:   CBC  Recent Labs Lab 06/01/17 0342  WBC 8.0  HGB 11.7*  HCT 34.0*  PLT 213    Chemistries   Recent Labs Lab 05/30/17 2058  06/01/17 0342  NA 137  < > 138  K 3.7  < > 3.0*  CL 105  < > 108  CO2 23  < > 23  GLUCOSE 129*  < > 94  BUN 19  < > 17  CREATININE 1.11  < > 0.99  CALCIUM 9.0  < > 8.0*  MG 1.9  --   --   < > = values in this interval not displayed.   Microbiology Results  Results for orders placed or performed during the hospital encounter of 05/30/17  Urine culture     Status: Abnormal (Preliminary result)   Collection Time: 05/30/17  8:58 PM  Result Value Ref Range Status   Specimen Description URINE, RANDOM  Final   Special Requests NONE  Final   Culture >=100,000 COLONIES/mL ESCHERICHIA COLI (A)  Final   Report Status  PENDING  Incomplete  Blood culture (routine x 2)     Status: None (Preliminary result)   Collection Time: 05/30/17 11:58 PM  Result Value Ref Range Status   Specimen Description BLOOD BLOOD LEFT HAND  Final   Special Requests   Final    BOTTLES DRAWN AEROBIC AND ANAEROBIC Blood Culture adequate volume   Culture NO GROWTH 1 DAY  Final   Report Status PENDING  Incomplete  Blood culture (routine x 2)     Status: None (Preliminary result)   Collection Time: 05/30/17 11:58 PM  Result Value Ref Range Status   Specimen Description BLOOD RIGHT ANTECUBITAL  Final   Special Requests   Final    BOTTLES DRAWN AEROBIC AND ANAEROBIC Blood Culture adequate volume   Culture NO GROWTH 1 DAY  Final   Report Status PENDING  Incomplete    RADIOLOGY:  No results found.   Management plans discussed with the patient, family and they are in agreement.  CODE STATUS:  Code Status Orders        Start     Ordered   05/31/17 0209  Full code  Continuous     05/31/17 0208    Code Status History    Date Active Date Inactive Code Status Order ID Comments User Context   03/04/2017  6:33 PM 03/05/2017  2:26 PM Full Code [id number]  Idelle Crouch, MD Inpatient   07/11/2016  5:38 AM 07/13/2016  2:20 PM Full Code [id number]  Saundra Shelling, MD ED      TOTAL TIME TAKING CARE OF THIS PATIENT: 37 minutes.    Srihith Aquilino M.D on 06/01/2017 at 12:45 PM  Between 7am to 6pm - Pager - [phone]  After 6pm go to [email or web address] - Proofreader  [facility]  Office  [phone]  CC: Primary care physician; [name], MD   Note: This dictation was prepared with Dragon dictation along with smaller phrase technology. Any transcriptional errors that result from this process are unintentional.

## 2017-06-01 NOTE — Care Management Obs Status (Signed)
[place] NOTIFICATION   Patient Details  Name: Vincent Poole MRN: [id number] Date of Birth: 16-Nov-1934   Medicare Observation Status Notification Given:  Yes    Beverly Sessions, RN 06/01/2017, 2:58 PM

## 2017-06-01 NOTE — Progress Notes (Signed)
Patient discharged to home. Concerns addressed. IV site removed. Wife at bedside

## 2017-06-02 ENCOUNTER — Encounter: Payer: Self-pay | Admitting: Emergency Medicine

## 2017-06-02 ENCOUNTER — Emergency Department
Admission: EM | Admit: 2017-06-02 | Discharge: 2017-06-02 | Disposition: A | Discharge status: Home / Self Care | Payer: Medicare Other | Attending: Emergency Medicine | Admitting: Emergency Medicine

## 2017-06-02 DIAGNOSIS — Z79899 Other long term (current) drug therapy: Secondary | ICD-10-CM | POA: Insufficient documentation

## 2017-06-02 DIAGNOSIS — R339 Retention of urine, unspecified: Secondary | ICD-10-CM | POA: Insufficient documentation

## 2017-06-02 DIAGNOSIS — N3289 Other specified disorders of bladder: Secondary | ICD-10-CM | POA: Diagnosis not present

## 2017-06-02 LAB — URINALYSIS, COMPLETE (UACMP) WITH MICROSCOPIC
BILIRUBIN URINE: NEGATIVE
Glucose, UA: NEGATIVE mg/dL
KETONES UR: NEGATIVE mg/dL
Nitrite: NEGATIVE
PROTEIN: 100 mg/dL — AB
Specific Gravity, Urine: 1.009 (ref 1.005–1.030)
pH: 5 (ref 5.0–8.0)

## 2017-06-02 LAB — URINE CULTURE: Culture: >=100000 — AB

## 2017-06-02 MED ORDER — PHENAZOPYRIDINE HCL 200 MG PO TABS
200.0000 mg | ORAL_TABLET | Freq: Once | ORAL | Status: AC
  Administered 2017-06-02: 200 mg via ORAL
  Filled 2017-06-02: qty 1

## 2017-06-02 MED ORDER — PHENAZOPYRIDINE HCL 100 MG PO TABS: 100.0000 mg | ORAL_TABLET | Freq: Three times a day (TID) | ORAL | 0 refills | Status: DC | PRN

## 2017-06-02 MED ORDER — LIDOCAINE HCL 2 % EX GEL
1.0000 "application " | Freq: Once | CUTANEOUS | Status: AC
  Administered 2017-06-02: 1 via URETHRAL

## 2017-06-02 MED ORDER — LIDOCAINE HCL 2 % EX GEL
CUTANEOUS | Status: DC
Start: 2017-06-02 — End: 2017-06-02
  Filled 2017-06-02: qty 10

## 2017-06-02 NOTE — ED Provider Notes (Signed)
[facility] Emergency Department Provider Note    First MD Initiated Contact with Patient 06/02/17 [phone]     (approximate)  I have reviewed the triage vital signs and the nursing notes.   HISTORY  Chief Complaint Urinary Retention   HPI Vincent Poole is a 82 y.o. male with below list of chronic medical conditions including recent pyelonephritis with hospital admission Tuesday with results and discharged on Thursday presents to the emergency department with inability to urinate since approximately midnight. Patient admits to suprapubic discomfort. Patient denies any back pain no fever no nausea or vomiting. Patient's temperature on arrival 97.8.   Past Medical History:  Diagnosis Date  . Anxiety   . Arthritis   . Prostate cancer [facility])     Patient Active Problem List   Diagnosis Date Noted  . Pyelonephritis 05/31/2017  . UTI (urinary tract infection) 03/04/2017  . Urinary obstruction   . Essential hypertension 08/30/2016  . History of shingles 08/30/2016  . Prostate cancer ([place]) 08/30/2016  . Urinary retention 08/30/2016  . Medicare annual wellness visit, initial 08/01/2016  . Medicare annual wellness visit, subsequent 08/01/2016  . Sepsis ([place]) 07/11/2016  . Borderline diabetes mellitus 01/26/2016  . Vaccine counseling 01/26/2015  . Lumbar radiculitis 06/24/2014  . OA (osteoarthritis) of hip 06/24/2014  . Malignant neoplasm of prostate ([place]) 01/27/2011    Past Surgical History:  Procedure Laterality Date  . APPENDECTOMY    . CHOLECYSTECTOMY      Prior to Admission medications   Medication Sig Start Date End Date Taking? Authorizing Provider  acetaminophen (TYLENOL) 500 MG tablet Take 500 mg by mouth every 6 (six) hours as needed.    [provider]  cephALEXin (KEFLEX) 500 MG capsule Take 1 capsule (500 mg total) by mouth 3 (three) times daily. X 5 more days 06/01/17   Gladstone Lighter, MD  finasteride (PROSCAR) 5 MG tablet  Take 1 tablet (5 mg total) by mouth daily. 11/28/16   Ardis Hughs, MD  lisinopril-hydrochlorothiazide (PRINZIDE,ZESTORETIC) 10-12.5 MG per tablet Take 0.5 tablets by mouth daily.  07/03/12   [provider]  tamsulosin (FLOMAX) 0.4 MG CAPS capsule Take 1 capsule (0.4 mg total) by mouth daily after supper. 11/28/16   Ardis Hughs, MD    Allergies Patient has no known allergies.  Family History  Problem Relation Age of Onset  . Cancer Mother   . Chronic Renal Failure Mother   . Heart disease Father     Social History Social History  Substance Use Topics  . Smoking status: Never Smoker  . Smokeless tobacco: Never Used  . Alcohol use No    Review of Systems Constitutional: No fever/chills Eyes: No visual changes. ENT: No sore throat. Cardiovascular: Denies chest pain. Respiratory: Denies shortness of breath. Gastrointestinal: No abdominal pain.  No nausea, no vomiting.  No diarrhea.  No constipation. Genitourinary: Negative for dysuria. Musculoskeletal: Negative for neck pain.  Negative for back pain. Integumentary: Negative for rash. Neurological: Negative for headaches, focal weakness or numbness.   ____________________________________________   PHYSICAL EXAM:  VITAL SIGNS: ED Triage Vitals  Enc Vitals Group     BP 06/02/17 0559 (!) 163/111     Pulse Rate 06/02/17 0559 100     Resp 06/02/17 0559 20     Temp 06/02/17 0559 97.8 F (36.6 C)     Temp Source 06/02/17 0559 Oral     SpO2 06/02/17 0559 100 %     Weight 06/02/17 0556  90.7 kg (200 lb)     Height 06/02/17 0556 1.803 m (5\' 11" )     Head Circumference --      Peak Flow --      Pain Score 06/02/17 0601 10     Pain Loc --      Pain Edu? --      Excl. in [place]? --     Constitutional: Alert and oriented. Apparent discomfort Eyes: Conjunctivae are normal.  Head: Atraumatic. Mouth/Throat: Mucous membranes are moist. Oropharynx non-erythematous. Neck: No stridor.   Cardiovascular: Normal  rate, regular rhythm. Good peripheral circulation. Grossly normal heart sounds. Respiratory: Normal respiratory effort.  No retractions. Lungs CTAB. Gastrointestinal: Soft and nontender. No distention. Suprapubic tenderness to palpation Musculoskeletal: No lower extremity tenderness nor edema. No gross deformities of extremities. Neurologic:  Normal speech and language. No gross focal neurologic deficits are appreciated.  Skin:  Skin is warm, dry and intact. No rash noted. Psychiatric: Mood and affect are normal. Speech and behavior are normal.  ____________________________________________   LABS (all labs ordered are listed, but only abnormal results are displayed)  Labs Reviewed  URINALYSIS, COMPLETE (UACMP) WITH MICROSCOPIC     Procedures   ____________________________________________   INITIAL IMPRESSION / ASSESSMENT AND PLAN / ED COURSE  Pertinent labs & imaging results that were available during my care of the patient were reviewed by me and considered in my medical decision making (see chart for details).  82 year old male recently diagnosed with cystitis/pyelonephritis presenting to the emergency department with inability to urinate since midnight. Bladder scan really approximately 200 ML's of urine however given patient's marked discomfort. Catheter place only 150 ML's of urine was drained. Suspect that the patient's having bladder spasms and a such Pyridium was given.      ____________________________________________  FINAL CLINICAL IMPRESSION(S) / ED DIAGNOSES  Final diagnoses:  Urinary retention  Bladder spasm     MEDICATIONS GIVEN DURING THIS VISIT:  Medications  lidocaine (XYLOCAINE) 2 % jelly 1 application (1 application Urethral Given 06/02/17 0613)     NEW OUTPATIENT MEDICATIONS STARTED DURING THIS VISIT:  New Prescriptions   No medications on file    Modified Medications   No medications on file    Discontinued Medications   No  medications on file     Note:  This document was prepared using Dragon voice recognition software and may include unintentional dictation errors.    Gregor Hams, MD 06/02/17 [id number]

## 2017-06-02 NOTE — ED Triage Notes (Signed)
Admitted Tuesday for kidney infection, received IV antibiotics; d/c Thursday evening; since midnight unable to urinate

## 2017-06-04 ENCOUNTER — Emergency Department
Admission: EM | Admit: 2017-06-04 | Discharge: 2017-06-04 | Disposition: A | Discharge status: Home / Self Care | Payer: Medicare Other | Attending: Emergency Medicine | Admitting: Emergency Medicine

## 2017-06-04 ENCOUNTER — Encounter: Payer: Self-pay | Admitting: Emergency Medicine

## 2017-06-04 DIAGNOSIS — Z8546 Personal history of malignant neoplasm of prostate: Secondary | ICD-10-CM | POA: Diagnosis not present

## 2017-06-04 DIAGNOSIS — I10 Essential (primary) hypertension: Secondary | ICD-10-CM | POA: Diagnosis not present

## 2017-06-04 DIAGNOSIS — N10 Acute tubulo-interstitial nephritis: Secondary | ICD-10-CM | POA: Diagnosis not present

## 2017-06-04 DIAGNOSIS — R1030 Lower abdominal pain, unspecified: Secondary | ICD-10-CM | POA: Diagnosis not present

## 2017-06-04 DIAGNOSIS — R339 Retention of urine, unspecified: Secondary | ICD-10-CM | POA: Insufficient documentation

## 2017-06-04 DIAGNOSIS — Z79899 Other long term (current) drug therapy: Secondary | ICD-10-CM | POA: Diagnosis not present

## 2017-06-04 LAB — CBC
HEMATOCRIT: 38.4 % — AB (ref 40.0–52.0)
HEMOGLOBIN: 13.2 g/dL (ref 13.0–18.0)
MCH: 29.5 pg (ref 26.0–34.0)
MCHC: 34.3 g/dL (ref 32.0–36.0)
MCV: 86 fL (ref 80.0–100.0)
Platelets: 371 10*3/uL (ref 150–440)
RBC: 4.46 MIL/uL (ref 4.40–5.90)
RDW: 15 % — ABNORMAL HIGH (ref 11.5–14.5)
WBC: 10.9 10*3/uL — AB (ref 3.8–10.6)

## 2017-06-04 LAB — URINALYSIS, COMPLETE (UACMP) WITH MICROSCOPIC
Specific Gravity, Urine: 1.011 (ref 1.005–1.030)
Squamous Epithelial / LPF: NONE SEEN

## 2017-06-04 LAB — BASIC METABOLIC PANEL
ANION GAP: 11 (ref 5–15)
BUN: 14 mg/dL (ref 6–20)
CO2: 22 mmol/L (ref 22–32)
Calcium: 9.1 mg/dL (ref 8.9–10.3)
Chloride: 105 mmol/L (ref 101–111)
Creatinine, Ser: 1.27 mg/dL — ABNORMAL HIGH (ref 0.61–1.24)
GFR calc Af Amer: 60 mL/min — ABNORMAL LOW (ref >60)
GFR, EST NON AFRICAN AMERICAN: 52 mL/min — AB (ref >60)
GLUCOSE: 152 mg/dL — AB (ref 65–99)
POTASSIUM: 2.9 mmol/L — AB (ref 3.5–5.1)
SODIUM: 138 mmol/L (ref 135–145)

## 2017-06-04 MED ORDER — POTASSIUM CHLORIDE CRYS ER 20 MEQ PO TBCR
40.0000 meq | EXTENDED_RELEASE_TABLET | Freq: Once | ORAL | Status: AC
  Administered 2017-06-04: 40 meq via ORAL
  Filled 2017-06-04: qty 2

## 2017-06-04 MED ORDER — SODIUM CHLORIDE 0.9 % IV BOLUS (SEPSIS)
500.0000 mL | Freq: Once | INTRAVENOUS | Status: AC
  Administered 2017-06-04: 500 mL via INTRAVENOUS

## 2017-06-04 NOTE — ED Notes (Signed)
Patient has completed his fluid bolus and tolerated well. Wife at bedside given discharge instructions and is without questions.

## 2017-06-04 NOTE — ED Notes (Addendum)
Wife at bedside with patient and provides much of patient's recent urinary history.

## 2017-06-04 NOTE — Discharge Instructions (Signed)
Please seek medical attention for any high fevers, chest pain, shortness of breath, change in behavior, persistent vomiting, bloody stool or any other new or concerning symptoms.  

## 2017-06-04 NOTE — ED Triage Notes (Signed)
Pt c/o urinary retention and unable to urinate for last 3-4 hours.  On abx for UTI currently. Was admitted and they checked for need of foley but was not retaining enough for foley.  No fevers.  Having bladder spasms per pt wife.

## 2017-06-04 NOTE — ED Provider Notes (Signed)
[facility] Emergency Department Provider Note  ____________________________________________   I have reviewed the triage vital signs and the nursing notes.   HISTORY  Chief Complaint Urinary Retention   History limited by: Not cooperative   HPI Vincent Poole is a 82 y.o. male who presents to the emergency department today because of concerns for lower abdominal pain and urinary retention. The patient was recently diagnosed with urinary tract infection. He was in the hospital for short period. Since starting the hospital he has had issues with urinary retention. He was seen in the emergency department 2 days ago with the same. At that time and in and out catheter was performed. Given low volume it was decided they would try Pyridium and defer indwelling catheter. Patient states however he has not been able to get the Pyridium prescription filled. This pain is severe. It is located in the lower abdomen. It started roughly 4 hours prior to my evaluation.    Past Medical History:  Diagnosis Date  . Anxiety   . Arthritis   . Prostate cancer [facility])     Patient Active Problem List   Diagnosis Date Noted  . Pyelonephritis 05/31/2017  . UTI (urinary tract infection) 03/04/2017  . Urinary obstruction   . Essential hypertension 08/30/2016  . History of shingles 08/30/2016  . Prostate cancer ([place]) 08/30/2016  . Urinary retention 08/30/2016  . Medicare annual wellness visit, initial 08/01/2016  . Medicare annual wellness visit, subsequent 08/01/2016  . Sepsis ([place]) 07/11/2016  . Borderline diabetes mellitus 01/26/2016  . Vaccine counseling 01/26/2015  . Lumbar radiculitis 06/24/2014  . OA (osteoarthritis) of hip 06/24/2014  . Malignant neoplasm of prostate ([place]) 01/27/2011    Past Surgical History:  Procedure Laterality Date  . APPENDECTOMY    . CHOLECYSTECTOMY      Prior to Admission medications   Medication Sig Start Date End Date Taking?  Authorizing Provider  acetaminophen (TYLENOL) 500 MG tablet Take 500 mg by mouth every 6 (six) hours as needed.    [provider]  cephALEXin (KEFLEX) 500 MG capsule Take 1 capsule (500 mg total) by mouth 3 (three) times daily. X 5 more days 06/01/17   Gladstone Lighter, MD  finasteride (PROSCAR) 5 MG tablet Take 1 tablet (5 mg total) by mouth daily. 11/28/16   Ardis Hughs, MD  lisinopril-hydrochlorothiazide (PRINZIDE,ZESTORETIC) 10-12.5 MG per tablet Take 0.5 tablets by mouth daily.  07/03/12   [provider]  phenazopyridine (PYRIDIUM) 100 MG tablet Take 1 tablet (100 mg total) by mouth 3 (three) times daily as needed for pain. 06/02/17   Gregor Hams, MD  tamsulosin (FLOMAX) 0.4 MG CAPS capsule Take 1 capsule (0.4 mg total) by mouth daily after supper. 11/28/16   Ardis Hughs, MD    Allergies Patient has no known allergies.  Family History  Problem Relation Age of Onset  . Cancer Mother   . Chronic Renal Failure Mother   . Heart disease Father     Social History Social History  Substance Use Topics  . Smoking status: Never Smoker  . Smokeless tobacco: Never Used  . Alcohol use No    Review of Systems Constitutional: No fever/chills Eyes: No visual changes. ENT: No sore throat. Cardiovascular: Denies chest pain. Respiratory: Denies shortness of breath. Gastrointestinal: Positive for lower abdominal pain. Genitourinary: Positive for urinary retention. Musculoskeletal: Negative for back pain. Skin: Negative for rash. Neurological: Negative for headaches, focal weakness or numbness.  ____________________________________________   PHYSICAL EXAM:  VITAL SIGNS: ED Triage Vitals  Enc Vitals Group     BP 06/04/17 1743 (!) 138/95     Pulse Rate 06/04/17 1743 98     Resp 06/04/17 1743 (!) 26     Temp 06/04/17 1743 98 F (36.7 C)     Temp Source 06/04/17 1743 Oral     SpO2 06/04/17 1743 98 %     Weight 06/04/17 1743 200 lb (90.7 kg)      Height 06/04/17 1743 5\' 11"  (1.803 m)     Head Circumference --      Peak Flow --      Pain Score 06/04/17 1742 8    Constitutional: Alert and oriented. Appears uncomfortable.  Eyes: Conjunctivae are normal.  ENT   Head: Normocephalic and atraumatic.   Nose: No congestion/rhinnorhea.   Mouth/Throat: Mucous membranes are moist.   Neck: No stridor. Hematological/Lymphatic/Immunilogical: No cervical lymphadenopathy. Cardiovascular: Normal rate, regular rhythm.  No murmurs, rubs, or gallops.  Respiratory: Normal respiratory effort without tachypnea nor retractions. Breath sounds are clear and equal bilaterally. No wheezes/rales/rhonchi. Gastrointestinal: Soft and tender to palpation in the lower abdomen.  Genitourinary: Deferred Musculoskeletal: Normal range of motion in all extremities. No lower extremity edema. Neurologic:  Normal speech and language. No gross focal neurologic deficits are appreciated.  Skin:  Skin is warm, dry and intact. No rash noted. Psychiatric: Mood and affect are normal. Speech and behavior are normal. Patient exhibits appropriate insight and judgment.  ____________________________________________    LABS (pertinent positives/negatives)  Labs Reviewed  BASIC METABOLIC PANEL - Abnormal; Notable for the following:       Result Value   Potassium 2.9 (*)    Glucose, Bld 152 (*)    Creatinine, Ser 1.27 (*)    GFR calc non Af Amer 52 (*)    GFR calc Af Amer 60 (*)    All other components within normal limits  CBC - Abnormal; Notable for the following:    WBC 10.9 (*)    HCT 38.4 (*)    RDW 15.0 (*)    All other components within normal limits  URINALYSIS, COMPLETE (UACMP) WITH MICROSCOPIC - Abnormal; Notable for the following:    Color, Urine ORANGE (*)    APPearance HAZY (*)    Glucose, UA   (*)    Value: TEST NOT REPORTED DUE TO COLOR INTERFERENCE OF URINE PIGMENT   Hgb urine dipstick   (*)    Value: TEST NOT REPORTED DUE TO COLOR  INTERFERENCE OF URINE PIGMENT   Bilirubin Urine   (*)    Value: TEST NOT REPORTED DUE TO COLOR INTERFERENCE OF URINE PIGMENT   Ketones, ur   (*)    Value: TEST NOT REPORTED DUE TO COLOR INTERFERENCE OF URINE PIGMENT   Protein, ur   (*)    Value: TEST NOT REPORTED DUE TO COLOR INTERFERENCE OF URINE PIGMENT   Nitrite   (*)    Value: TEST NOT REPORTED DUE TO COLOR INTERFERENCE OF URINE PIGMENT   Leukocytes, UA   (*)    Value: TEST NOT REPORTED DUE TO COLOR INTERFERENCE OF URINE PIGMENT   Bacteria, UA RARE (*)    All other components within normal limits  URINE CULTURE     ____________________________________________   EKG  None  ____________________________________________    RADIOLOGY  None   ____________________________________________   PROCEDURES  Procedures  ____________________________________________   INITIAL IMPRESSION / ASSESSMENT AND PLAN / ED COURSE  Pertinent labs & imaging results  that were available during my care of the patient were reviewed by me and considered in my medical decision making (see chart for details).  Patient returns to the emergency department today because of concerns for urinary retention. He was seen at couple of days ago for the same thing. Given the recurrence Will have indwelling Foley catheter placed. Patient's creatinine was a little elevated. He was given some IV fluids. Additionally potassium is a little low so low potassium was given. Discussed patient parents following up with urology. He states he is currently on antibiotics for his urinary tract infection.  ____________________________________________   FINAL CLINICAL IMPRESSION(S) / ED DIAGNOSES  Final diagnoses:  Urinary retention     Note: This dictation was prepared with Dragon dictation. Any transcriptional errors that result from this process are unintentional     Nance Pear, MD 06/04/17 2259

## 2017-06-04 NOTE — ED Notes (Signed)
Patient in restroom attempting to give urine sample. Only able to void about 8 mL which is not enough per the lab. Foley order received. Will place accordingly.

## 2017-06-04 NOTE — ED Notes (Signed)
Bladder scan 404 ml

## 2017-06-04 NOTE — ED Notes (Signed)
Spoke with dr Kerman Passey, no sepsis work up at this time

## 2017-06-05 LAB — CULTURE, BLOOD (ROUTINE X 2)
CULTURE: NO GROWTH
Culture: NO GROWTH
SPECIAL REQUESTS: ADEQUATE
Special Requests: ADEQUATE

## 2017-06-06 LAB — URINE CULTURE: Culture: NO GROWTH

## 2017-06-12 NOTE — Progress Notes (Signed)
New PT note to include G codes.     06-16-17 1057  PT G-Codes **NOT FOR INPATIENT CLASS**  Functional Assessment Tool Used AM-PAC 6 Clicks Basic Mobility;Clinical judgement  Functional Limitation Mobility: Walking and moving around  Mobility: Walking and Moving Around Current Status (D4718) CJ  Mobility: Walking and Moving Around Goal Status (Z5015) CI   Collie Siad PT, DPT

## 2017-06-13 ENCOUNTER — Ambulatory Visit (INDEPENDENT_AMBULATORY_CARE_PROVIDER_SITE_OTHER): Payer: Medicare Other | Admitting: Urology

## 2017-06-13 ENCOUNTER — Encounter: Payer: Self-pay | Admitting: Urology

## 2017-06-13 VITALS — BP 128/74 | HR 116 | Ht 71.0 in | Wt 194.7 lb

## 2017-06-13 DIAGNOSIS — R338 Other retention of urine: Secondary | ICD-10-CM | POA: Diagnosis not present

## 2017-06-13 DIAGNOSIS — N401 Enlarged prostate with lower urinary tract symptoms: Secondary | ICD-10-CM | POA: Diagnosis not present

## 2017-06-13 NOTE — Progress Notes (Signed)
Fill and Pull Catheter Removal  Patient is present today for a catheter removal.  Patient was cleaned and prepped in a sterile fashion 117ml of sterile water/ saline was instilled into the bladder when the patient felt the urge to urinate. 83ml of water was then drained from the balloon.  A 16FR foley cath was removed from the bladder complications were noted as: bladder spasms. Patient was unable to tolerate. .  Patient as then given some time to void on their own.  Patient cannot void, will return this afternoon.     Preformed by: C. Corinna Capra, CMA  Pt returned this afternoon:  Bladder Scan Patient can void: 0 ml Performed By: C. Corinna Capra, CMA  Patient will follow-up with Dr. Erlene Quan on 06/28/17.

## 2017-06-13 NOTE — Progress Notes (Signed)
06/13/2017 11:53 AM   Vincent Poole [birth year] [id number]  Referring provider: [name], MD [facility] [place], [place] [address]  Chief Complaint  Patient presents with  . Foley Removal    HPI: The patient is an 81 year old gentleman with history of BPH on Flomax and finasteride, history of prostate cancer, and history of urinary retention and presents today after being seen in the emergency room and diagnosis of recurrent urinary retention in a UTI. He was started on antibiotics and a Foley catheter was placed.He has finished his antibiotic course for this Escherichia coli UTI. This is the second or third time that he is developed retention with a UTI in the last year and a half or so.  The patient has a past GU history significant for Gleason 4+3=7 prostate cancer,pretreatment PSA 29, and an abnormal rectal exam for which he was treated with external beam radiation therapy in 2012. He received androgen deprivation therapy with his last injection on 07/03/12. His most recent PSA was 0.02 in February 2018.  PMH: Past Medical History:  Diagnosis Date  . Anxiety   . Arthritis   . Prostate cancer [facility])     Surgical History: Past Surgical History:  Procedure Laterality Date  . APPENDECTOMY    . CHOLECYSTECTOMY      Home Medications:  Allergies as of 06/13/2017   No Known Allergies     Medication List       Accurate as of 06/13/17 11:53 AM. Always use your most recent med list.          acetaminophen 500 MG tablet Commonly known as:  TYLENOL Take 500 mg by mouth every 6 (six) hours as needed.   finasteride 5 MG tablet Commonly known as:  PROSCAR Take 1 tablet (5 mg total) by mouth daily.   lisinopril-hydrochlorothiazide 10-12.5 MG tablet Commonly known as:  PRINZIDE,ZESTORETIC Take 0.5 tablets by mouth daily.   phenazopyridine 100 MG tablet Commonly known as:  PYRIDIUM Take 1 tablet (100 mg total) by mouth 3 (three)  times daily as needed for pain.   potassium chloride SA 20 MEQ tablet Commonly known as:  K-DUR,KLOR-CON Take 1 tablet by mouth daily.   tamsulosin 0.4 MG Caps capsule Commonly known as:  FLOMAX Take 1 capsule (0.4 mg total) by mouth daily after supper.       Allergies: No Known Allergies  Family History: Family History  Problem Relation Age of Onset  . Cancer Mother   . Chronic Renal Failure Mother   . Heart disease Father     Social History:  reports that he has never smoked. He has never used smokeless tobacco. He reports that he does not drink alcohol or use drugs.  ROS: UROLOGY Frequent Urination?: Yes Hard to postpone urination?: No Burning/pain with urination?: No Get up at night to urinate?: Yes Leakage of urine?: No Urine stream starts and stops?: No Trouble starting stream?: No Do you have to strain to urinate?: No Blood in urine?: No Urinary tract infection?: No Sexually transmitted disease?: No Injury to kidneys or bladder?: No Painful intercourse?: No Weak stream?: No Erection problems?: No Penile pain?: No  Gastrointestinal Nausea?: No Vomiting?: No Indigestion/heartburn?: No Diarrhea?: No Constipation?: No  Constitutional Fever: No Night sweats?: No Weight loss?: No Fatigue?: No  Skin Skin rash/lesions?: No Itching?: No  Eyes Blurred vision?: No Double vision?: No  Ears/Nose/Throat Sore throat?: No Sinus problems?: No  Hematologic/Lymphatic Swollen glands?: No Easy bruising?: No  Cardiovascular Leg swelling?: No Chest pain?: No  Respiratory Cough?: No Shortness of breath?: No  Endocrine Excessive thirst?: No  Musculoskeletal Back pain?: No Joint pain?: No  Neurological Headaches?: No Dizziness?: No  Psychologic Depression?: No Anxiety?: No  Physical Exam: BP 128/74 (BP Location: Left Arm, Patient Position: Sitting, Cuff Size: Large)   Pulse (!) 116   Ht 5\' 11"  (1.803 m)   Wt 194 lb 11.2 oz (88.3 kg)    BMI 27.16 kg/m   Constitutional:  Alert and oriented, No acute distress. HEENT: [place] AT, moist mucus membranes.  Trachea midline, no masses. Cardiovascular: No clubbing, cyanosis, or edema. Respiratory: Normal respiratory effort, no increased work of breathing. GI: Abdomen is soft, nontender, nondistended, no abdominal masses GU: No CVA tenderness. Foley in place. Skin: No rashes, bruises or suspicious lesions. Lymph: No cervical or inguinal adenopathy. Neurologic: Grossly intact, no focal deficits, moving all 4 extremities. Psychiatric: Normal mood and affect.  Laboratory Data: Lab Results  Component Value Date   WBC 10.9 (H) 06/04/2017   HGB 13.2 06/04/2017   HCT 38.4 (L) 06/04/2017   MCV 86.0 06/04/2017   PLT 371 06/04/2017    Lab Results  Component Value Date   CREATININE 1.27 (H) 06/04/2017    Lab Results  Component Value Date   PSA 0.02 12/22/2016   PSA 0.05 12/24/2015   PSA 0.05 05/27/2015    No results found for: TESTOSTERONE  No results found for: HGBA1C  Urinalysis    Component Value Date/Time   COLORURINE ORANGE (A) 06/04/2017 1945   APPEARANCEUR HAZY (A) 06/04/2017 1945   LABSPEC 1.011 06/04/2017 1945   PHURINE  06/04/2017 1945    TEST NOT REPORTED DUE TO COLOR INTERFERENCE OF URINE PIGMENT   GLUCOSEU (A) 06/04/2017 1945    TEST NOT REPORTED DUE TO COLOR INTERFERENCE OF URINE PIGMENT   HGBUR (A) 06/04/2017 1945    TEST NOT REPORTED DUE TO COLOR INTERFERENCE OF URINE PIGMENT   BILIRUBINUR (A) 06/04/2017 1945    TEST NOT REPORTED DUE TO COLOR INTERFERENCE OF URINE PIGMENT   KETONESUR (A) 06/04/2017 1945    TEST NOT REPORTED DUE TO COLOR INTERFERENCE OF URINE PIGMENT   PROTEINUR (A) 06/04/2017 1945    TEST NOT REPORTED DUE TO COLOR INTERFERENCE OF URINE PIGMENT   NITRITE (A) 06/04/2017 1945    TEST NOT REPORTED DUE TO COLOR INTERFERENCE OF URINE PIGMENT   LEUKOCYTESUR (A) 06/04/2017 1945    TEST NOT REPORTED DUE TO COLOR INTERFERENCE OF URINE PIGMENT     Assessment & Plan:    1. BPH with urinary retention The patient passes filling pole today. I will increase his Flomax to 0.8 mg daily and continue his finasteride at this time. This would place him now on maximum medical therapy for his BPH. If he continues to have episodes of urinary retention with a concurrent UTI a TURP could be considered at that time. However the patient and his wife state they are not currently interested in surgery. He has a follow-up appointment scheduled here in a few weeks. He'll keep this appointment to recheck his PVR.  2. History of prostate cancer Will be for repeat PSA in November 2019.  Nickie Retort, MD  [facility] [address], [place], [place] [address] [phone]

## 2017-06-19 ENCOUNTER — Telehealth: Payer: Self-pay | Admitting: Urology

## 2017-06-19 MED ORDER — TAMSULOSIN HCL 0.4 MG PO CAPS: 0.8000 mg | ORAL_CAPSULE | Freq: Every day | ORAL | 3 refills | Status: DC

## 2017-06-19 NOTE — Telephone Encounter (Signed)
Script sent  

## 2017-06-19 NOTE — Telephone Encounter (Signed)
Patient was seen in the clinic last week.  Dr. Pilar Jarvis told him that he would increase his tamsulosin, but the prescription was not seen to the pharmacy.  Per Dr. Carlynn Purl note: "I will increase his Flomax to 0.8 mg daily and continue his finasteride at this time."  Wife called the office today, requesting that the prescription be called into the [facility].

## 2017-06-28 ENCOUNTER — Ambulatory Visit (INDEPENDENT_AMBULATORY_CARE_PROVIDER_SITE_OTHER): Payer: Medicare Other | Admitting: Urology

## 2017-06-28 ENCOUNTER — Encounter: Payer: Self-pay | Admitting: Urology

## 2017-06-28 VITALS — BP 113/69 | HR 97 | Ht 71.0 in | Wt 190.0 lb

## 2017-06-28 DIAGNOSIS — N138 Other obstructive and reflux uropathy: Secondary | ICD-10-CM | POA: Diagnosis not present

## 2017-06-28 DIAGNOSIS — R339 Retention of urine, unspecified: Secondary | ICD-10-CM | POA: Diagnosis not present

## 2017-06-28 DIAGNOSIS — Z87440 Personal history of urinary (tract) infections: Secondary | ICD-10-CM | POA: Diagnosis not present

## 2017-06-28 DIAGNOSIS — N401 Enlarged prostate with lower urinary tract symptoms: Secondary | ICD-10-CM

## 2017-06-28 DIAGNOSIS — Z8546 Personal history of malignant neoplasm of prostate: Secondary | ICD-10-CM | POA: Diagnosis not present

## 2017-06-28 LAB — URINALYSIS, COMPLETE
BILIRUBIN UA: NEGATIVE
Glucose, UA: NEGATIVE
Ketones, UA: NEGATIVE
Nitrite, UA: POSITIVE — AB
PH UA: 5.5 (ref 5.0–7.5)
Specific Gravity, UA: 1.02 (ref 1.005–1.030)
Urobilinogen, Ur: 1 mg/dL (ref 0.2–1.0)

## 2017-06-28 LAB — MICROSCOPIC EXAMINATION
EPITHELIAL CELLS (NON RENAL): NONE SEEN /HPF (ref 0–10)
WBC, UA: >30 /hpf — ABNORMAL HIGH (ref 0–?)

## 2017-06-28 MED ORDER — TAMSULOSIN HCL 0.4 MG PO CAPS: 0.4000 mg | ORAL_CAPSULE | Freq: Two times a day (BID) | ORAL | 11 refills | Status: DC

## 2017-06-28 NOTE — Patient Instructions (Signed)
Cranberry tabs twice daily  Probiotic or yogurt  Increase Flomax 0.4 to twice daily, once and night and once in the AM  Continue finasteride

## 2017-06-28 NOTE — Progress Notes (Signed)
06/28/2017 12:10 PM   Vincent Poole Jul 30, 1935 [id number]  Referring provider: [name], MD [facility] [place], [place] [address]  Chief Complaint  Patient presents with  . Follow-up    HPI: 82 year old male in who presents today for follow-up of multiple GU issues. He saw Dr. Pilar Jarvis in approximately 2 weeks ago and is unsure why this appointment was scheduled today. He does have several questions regarding his medication.  BPH with LUTS History of urinary retention, lower urinary tract symptoms.  Currently on finasteride and Flomax. Advised to increase Flomax to 0.8 mg 2 weeks ago but has not yet done this due to confusion about dosing.    Recurrent UTIs Moderately symptomatic today, IPSS as below.  UA is suspicious. We'll send culture to rule out infection.  No fevers or chills.   History of prostate cancer Significant for Gleason 4+3=7 prostate cancer,pretreatment PSA 29, and an abnormal rectal exam for which he was treated with external beam radiation therapy in 2012. He received androgen deprivation therapy with his last injection on 07/03/12. His most recent PSA was 0.02 in February 2018.       IPSS    Row Name 06/28/17 1100         International Prostate Symptom Score   How often have you had the sensation of not emptying your bladder? About half the time     How often have you had to urinate less than every two hours? Less than half the time     How often have you found you stopped and started again several times when you urinated? Not at All     How often have you found it difficult to postpone urination? Less than half the time     How often have you had a weak urinary stream? Less than 1 in 5 times     How often have you had to strain to start urination? Less than 1 in 5 times     How many times did you typically get up at night to urinate? 3 Times     Total IPSS Score 12       Quality of Life due to urinary  symptoms   If you were to spend the rest of your life with your urinary condition just the way it is now how would you feel about that? Mixed        Score:  1-7 Mild 8-19 Moderate 20-35 Severe   PMH: Past Medical History:  Diagnosis Date  . Anxiety   . Arthritis   . Prostate cancer [facility])     Surgical History: Past Surgical History:  Procedure Laterality Date  . APPENDECTOMY    . CHOLECYSTECTOMY      Home Medications:  Allergies as of 06/28/2017   No Known Allergies     Medication List       Accurate as of 06/28/17 11:59 PM. Always use your most recent med list.          acetaminophen 500 MG tablet Commonly known as:  TYLENOL Take 500 mg by mouth every 6 (six) hours as needed.   finasteride 5 MG tablet Commonly known as:  PROSCAR Take 1 tablet (5 mg total) by mouth daily.   lisinopril-hydrochlorothiazide 10-12.5 MG tablet Commonly known as:  PRINZIDE,ZESTORETIC Take 0.5 tablets by mouth daily.   phenazopyridine 100 MG tablet Commonly known as:  PYRIDIUM Take 1 tablet (100 mg total) by mouth 3 (three) times daily as  needed for pain.   potassium chloride SA 20 MEQ tablet Commonly known as:  K-DUR,KLOR-CON Take 1 tablet by mouth daily.   tamsulosin 0.4 MG Caps capsule Commonly known as:  FLOMAX Take 1 capsule (0.4 mg total) by mouth 2 (two) times daily.       Allergies: No Known Allergies  Family History: Family History  Problem Relation Age of Onset  . Cancer Mother   . Chronic Renal Failure Mother   . Heart disease Father     Social History:  reports that he has never smoked. He has never used smokeless tobacco. He reports that he does not drink alcohol or use drugs.  ROS: UROLOGY Frequent Urination?: Yes Hard to postpone urination?: Yes Burning/pain with urination?: Yes Get up at night to urinate?: Yes Leakage of urine?: No Urine stream starts and stops?: No Trouble starting stream?: No Do you have to strain to urinate?: No Blood in  urine?: No Urinary tract infection?: No Sexually transmitted disease?: No Injury to kidneys or bladder?: No Painful intercourse?: No Weak stream?: No Erection problems?: No Penile pain?: No  Gastrointestinal Nausea?: No Vomiting?: No Indigestion/heartburn?: No Diarrhea?: No Constipation?: No  Constitutional Fever: No Night sweats?: No Weight loss?: No Fatigue?: No  Skin Skin rash/lesions?: No Itching?: No  Eyes Blurred vision?: No Double vision?: No  Ears/Nose/Throat Sore throat?: No Sinus problems?: No  Hematologic/Lymphatic Swollen glands?: No Easy bruising?: No  Cardiovascular Leg swelling?: No Chest pain?: No  Respiratory Cough?: No Shortness of breath?: No  Endocrine Excessive thirst?: No  Musculoskeletal Back pain?: No Joint pain?: No  Neurological Headaches?: No Dizziness?: No  Psychologic Depression?: No Anxiety?: No  Physical Exam: BP 113/69   Pulse 97   Ht 5\' 11"  (1.803 m)   Wt 190 lb (86.2 kg)   BMI 26.50 kg/m   Constitutional:  Alert and oriented, No acute distress.  But he by wife today. HEENT: [place] AT, moist mucus membranes.  Trachea midline, no masses. Cardiovascular: No clubbing, cyanosis, or edema. Respiratory: Normal respiratory effort, no increased work of breathing. GI: Abdomen is soft, nontender, nondistended, no abdominal masses GU: No CVA tenderness.  Skin: No rashes, bruises or suspicious lesions. Neurologic: Grossly intact, no focal deficits, moving all 4 extremities. Psychiatric: Normal mood and affect.  Laboratory Data: Lab Results  Component Value Date   WBC 10.9 (H) 06/04/2017   HGB 13.2 06/04/2017   HCT 38.4 (L) 06/04/2017   MCV 86.0 06/04/2017   PLT 371 06/04/2017    Lab Results  Component Value Date   CREATININE 1.27 (H) 06/04/2017    Lab Results  Component Value Date   PSA 0.02 12/22/2016   PSA 0.05 12/24/2015   PSA 0.05 05/27/2015     UA/ Pertinent Imaging: Results for orders placed  or performed in visit on 06/28/17  Microscopic Examination  Result Value Ref Range   WBC, UA >30 (H) 0 - 5 /hpf   RBC, UA 0-2 0 - 2 /hpf   Epithelial Cells (non renal) None seen 0 - 10 /hpf   Mucus, UA Present (A) Not Estab.   Bacteria, UA Many (A) None seen/Few  CULTURE, URINE COMPREHENSIVE  Result Value Ref Range   Urine Culture, Comprehensive Final report (A)    Organism ID, Bacteria Escherichia coli (A)    ANTIMICROBIAL SUSCEPTIBILITY Comment   Urinalysis, Complete  Result Value Ref Range   Specific Gravity, UA 1.020 1.005 - 1.030   pH, UA 5.5 5.0 - 7.5   Color,  UA Yellow Yellow   Appearance Ur Cloudy (A) Clear   Leukocytes, UA 3+ (A) Negative   Protein, UA 2+ (A) Negative/Trace   Glucose, UA Negative Negative   Ketones, UA Negative Negative   RBC, UA 2+ (A) Negative   Bilirubin, UA Negative Negative   Urobilinogen, Ur 1.0 0.2 - 1.0 mg/dL   Nitrite, UA Positive (A) Negative   Microscopic Examination See below:   BLADDER SCAN AMB NON-IMAGING  Result Value Ref Range   Scan Result 87     Assessment & Plan:    1. BPH with urinary obstruction Medications were reviewed As per previous visit, continue finasteride and increase Flomax to 0.8 mg Bladder emptying today - Urinalysis, Complete - BLADDER SCAN AMB NON-IMAGING - CULTURE, URINE COMPREHENSIVE  2. History of recurrent UTIs UA suspicious today, symptoms may be related to underlying let us but possibly UTI We will treat as needed based on urine culture If he does have recurrent infection, will treat with prolonged course and then repeat urine culture to ensure resolution  In addition to the above, we did discuss other prevention strategies for UTIs including cranberry tabs twice daily, addition of probiotic or yogurt with live active culture These recommendations along with medications were typed out for the patient and printed to avoid confusion in the future  3. History of prostate cancer Currently NED Follow PSA  annually  F/u as previously schedule  [name], MD  [facility] [address] [place], [place] [address] [phone]

## 2017-06-30 LAB — BLADDER SCAN AMB NON-IMAGING: SCAN RESULT: 87

## 2017-07-02 LAB — CULTURE, URINE COMPREHENSIVE

## 2017-07-04 ENCOUNTER — Telehealth: Payer: Self-pay

## 2017-07-04 DIAGNOSIS — N390 Urinary tract infection, site not specified: Secondary | ICD-10-CM

## 2017-07-04 MED ORDER — TRIMETHOPRIM 100 MG PO TABS: 100.0000 mg | ORAL_TABLET | Freq: Every day | ORAL | 0 refills | Status: DC

## 2017-07-04 MED ORDER — SULFAMETHOXAZOLE-TRIMETHOPRIM 800-160 MG PO TABS: 1.0000 | ORAL_TABLET | Freq: Two times a day (BID) | ORAL | 0 refills | Status: AC

## 2017-07-04 NOTE — Telephone Encounter (Signed)
LMOM-both abx sent to pharmacy  

## 2017-07-04 NOTE — Telephone Encounter (Signed)
-----   Message from [name], MD sent at 07/03/2017 12:11 PM EDT ----- We sent a urine culture from clinic the day of his visit and it was + E coli with continued LUTS symptoms.  Lets treat with bactrim ds bid  X 10 days then switch to trimethoprim only 100 mg daily for prophylaxis dose x 3 months to try and break the cycle.    I would like to repeat the culture in ~2 weeks to ensure he has cleared.    [name], MD

## 2017-07-04 NOTE — Telephone Encounter (Signed)
Spoke with pt wife in reference to ucx results and needing 2 abx. Also made wife aware will need another ucx in 2 weeks. Wife voiced understanding of whole conversation. Lab appt made and orders placed.

## 2017-07-18 ENCOUNTER — Other Ambulatory Visit: Payer: Medicare Other

## 2017-07-18 DIAGNOSIS — N390 Urinary tract infection, site not specified: Secondary | ICD-10-CM

## 2017-07-21 ENCOUNTER — Telehealth: Payer: Self-pay

## 2017-07-21 LAB — CULTURE, URINE COMPREHENSIVE

## 2017-07-21 NOTE — Telephone Encounter (Signed)
-----   Message from [name], MD sent at 07/21/2017 12:11 PM EDT ----- UCx look good.  Please let him know.  [name], MD

## 2017-07-21 NOTE — Telephone Encounter (Signed)
Patient notified on vmail 

## 2017-08-03 DIAGNOSIS — Z87440 Personal history of urinary (tract) infections: Secondary | ICD-10-CM | POA: Insufficient documentation

## 2017-10-25 ENCOUNTER — Other Ambulatory Visit: Payer: Self-pay | Admitting: *Deleted

## 2017-10-25 DIAGNOSIS — C61 Malignant neoplasm of prostate: Secondary | ICD-10-CM

## 2017-11-07 DIAGNOSIS — R339 Retention of urine, unspecified: Secondary | ICD-10-CM

## 2017-11-07 DIAGNOSIS — N390 Urinary tract infection, site not specified: Secondary | ICD-10-CM

## 2017-11-07 HISTORY — DX: Retention of urine, unspecified: R33.9

## 2017-11-07 HISTORY — DX: Urinary tract infection, site not specified: N39.0

## 2017-11-17 ENCOUNTER — Ambulatory Visit (INDEPENDENT_AMBULATORY_CARE_PROVIDER_SITE_OTHER): Payer: Medicare Other

## 2017-11-17 VITALS — BP 113/69 | HR 97 | Ht 71.0 in | Wt 190.0 lb

## 2017-11-17 DIAGNOSIS — N401 Enlarged prostate with lower urinary tract symptoms: Secondary | ICD-10-CM | POA: Diagnosis not present

## 2017-11-17 DIAGNOSIS — N138 Other obstructive and reflux uropathy: Secondary | ICD-10-CM | POA: Diagnosis not present

## 2017-11-17 NOTE — Progress Notes (Signed)
Bladder Scan:600 Patient can void. Performed By: Toniann Fail, LPN   Simple Catheter Placement  Due to urinary retention patient is present today for a foley cath placement.  Patient was cleaned and prepped in a sterile fashion with betadine and lidocaine jelly 2% was instilled into the urethra.  A 18FR foley catheter was inserted, urine return was noted 66ml, urine was bright red in color.  The balloon was filled with 10cc of sterile water.  A night bag was attached for drainage. Patient was also given a night bag to take home and was given instruction on how to change from one bag to another.  Patient was given instruction on proper catheter care.  Patient tolerated well.  Preformed by: Dr. Pilar Jarvis assisted by Toniann Fail, LPN   Additional notes/ Follow up: pt will keep f/u appt for 11/28/17. Nurse was not able to get 33fr coude passed prostate therefore Dr. Pilar Jarvis was asked to help.  Blood pressure 113/69, pulse 97, height 5\' 11"  (1.803 m), weight 190 lb (86.2 kg).

## 2017-11-20 ENCOUNTER — Ambulatory Visit: Payer: Medicare Other

## 2017-11-28 ENCOUNTER — Ambulatory Visit: Payer: Medicare Other | Admitting: Urology

## 2017-11-28 ENCOUNTER — Encounter: Payer: Self-pay | Admitting: Urology

## 2017-11-28 VITALS — BP 111/65 | HR 114 | Ht 71.0 in | Wt 187.0 lb

## 2017-11-28 DIAGNOSIS — N390 Urinary tract infection, site not specified: Secondary | ICD-10-CM | POA: Diagnosis not present

## 2017-11-28 DIAGNOSIS — R339 Retention of urine, unspecified: Secondary | ICD-10-CM | POA: Diagnosis not present

## 2017-11-28 DIAGNOSIS — N401 Enlarged prostate with lower urinary tract symptoms: Secondary | ICD-10-CM | POA: Diagnosis not present

## 2017-11-28 DIAGNOSIS — N138 Other obstructive and reflux uropathy: Secondary | ICD-10-CM | POA: Diagnosis not present

## 2017-11-28 LAB — URINALYSIS, COMPLETE
Bilirubin, UA: NEGATIVE
Glucose, UA: NEGATIVE
Nitrite, UA: NEGATIVE
Specific Gravity, UA: 1.015 (ref 1.005–1.030)
Urobilinogen, Ur: 2 mg/dL — ABNORMAL HIGH (ref 0.2–1.0)
pH, UA: >9 — ABNORMAL HIGH (ref 5.0–7.5)

## 2017-11-28 LAB — MICROSCOPIC EXAMINATION
Epithelial Cells (non renal): NONE SEEN /hpf (ref 0–10)
RBC, UA: >30 /hpf — ABNORMAL HIGH (ref 0–?)
WBC, UA: >30 /hpf — ABNORMAL HIGH (ref 0–?)

## 2017-11-28 MED ORDER — SULFAMETHOXAZOLE-TRIMETHOPRIM 800-160 MG PO TABS: 1.0000 | ORAL_TABLET | Freq: Two times a day (BID) | ORAL | 0 refills | Status: DC

## 2017-11-28 NOTE — Progress Notes (Signed)
11/28/2017 1:37 PM   Vincent Poole 1935/07/28 [id number]  Referring provider: [name], MD [place] [facility] [place], [place] [address]  No chief complaint on file.   HPI: 83 year old male returns to the office today after developing urinary retention requiring Foley catheter placement on 11/17/2017.  Initially, he had some bleeding from his catheter at the time of placement which resolved within a few days and his urine remained clear.  Since yesterday, his urine has turned light pink and is now purulent in appearance.  He denies fevers or chills.  BPH with LUTS History of urinary retention, lower urinary tract symptoms.  Currently on finasteride and Flomax 0.8 mg.    Recurrent UTIs Treated for multiple infections over the past year.  Most recent urine culture on 822 growing pansensitive E. coli.  After multiple infections, he was placed on suppression for 3 months which is since been completed.  No recent cystoscopy.  CT renal stone study on 05/2017 shows no evidence of bladder or kidney stones.  History of prostate cancer Significant for Gleason 4+3=7 prostate cancer,pretreatment PSA 29, and an abnormal rectal exam for which he was treated with external beam radiation therapy in 2012. He received androgen deprivation therapy with his last injection on 07/03/12. His most recent PSA was0.02 in February 2018.  Follow-up with Dr. Baruch Gouty next week.    PMH: Past Medical History:  Diagnosis Date  . Anxiety   . Arthritis   . Prostate cancer [facility])     Surgical History: Past Surgical History:  Procedure Laterality Date  . APPENDECTOMY    . CHOLECYSTECTOMY      Home Medications:  Allergies as of 11/28/2017      Reactions   Penicillins Other (See Comments)   Caused nervousness      Medication List        Accurate as of 11/28/17  1:37 PM. Always use your most recent med list.          acetaminophen 500 MG tablet Commonly  known as:  TYLENOL Take 500 mg by mouth every 6 (six) hours as needed.   CRANBERRY CONCENTRATE/VITAMINC 15000-100 MG Caps Generic drug:  Cranberry-Vitamin C Take by mouth.   finasteride 5 MG tablet Commonly known as:  PROSCAR Take 1 tablet (5 mg total) by mouth daily.   lisinopril-hydrochlorothiazide 10-12.5 MG tablet Commonly known as:  PRINZIDE,ZESTORETIC Take 0.5 tablets by mouth daily.   phenazopyridine 100 MG tablet Commonly known as:  PYRIDIUM Take 1 tablet (100 mg total) by mouth 3 (three) times daily as needed for pain.   potassium chloride SA 20 MEQ tablet Commonly known as:  K-DUR,KLOR-CON Take 1 tablet by mouth daily.   sulfamethoxazole-trimethoprim 800-160 MG tablet Commonly known as:  BACTRIM DS,SEPTRA DS Take 1 tablet by mouth every 12 (twelve) hours.   tamsulosin 0.4 MG Caps capsule Commonly known as:  FLOMAX Take 1 capsule (0.4 mg total) by mouth 2 (two) times daily.   trimethoprim 100 MG tablet Commonly known as:  TRIMPEX Take 1 tablet (100 mg total) by mouth daily.       Allergies:  Allergies  Allergen Reactions  . Penicillins Other (See Comments)    Caused nervousness    Family History: Family History  Problem Relation Age of Onset  . Cancer Mother   . Chronic Renal Failure Mother   . Heart disease Father     Social History:  reports that  has never smoked. he has never used smokeless  tobacco. He reports that he does not drink alcohol or use drugs.  ROS: UROLOGY Frequent Urination?: No Hard to postpone urination?: Yes Burning/pain with urination?: No Get up at night to urinate?: No Leakage of urine?: No Urine stream starts and stops?: No Trouble starting stream?: No Do you have to strain to urinate?: No Blood in urine?: No Urinary tract infection?: No Sexually transmitted disease?: No Injury to kidneys or bladder?: No Painful intercourse?: No Weak stream?: No Erection problems?: No Penile pain?:  No  Gastrointestinal Nausea?: No Vomiting?: No Indigestion/heartburn?: No Diarrhea?: No Constipation?: No  Constitutional Fever: No Night sweats?: No Weight loss?: No Fatigue?: No  Skin Skin rash/lesions?: No Itching?: No  Eyes Blurred vision?: No Double vision?: No  Ears/Nose/Throat Sore throat?: No Sinus problems?: No  Hematologic/Lymphatic Swollen glands?: No Easy bruising?: No  Cardiovascular Leg swelling?: No Chest pain?: No  Respiratory Cough?: No Shortness of breath?: No  Endocrine Excessive thirst?: No  Musculoskeletal Back pain?: No Joint pain?: No  Neurological Headaches?: No Dizziness?: No  Psychologic Depression?: No Anxiety?: No  Physical Exam: BP 111/65 (BP Location: Left Arm, Patient Position: Sitting, Cuff Size: Normal)   Pulse (!) 114   Ht 5\' 11"  (1.803 m)   Wt 187 lb (84.8 kg)   BMI 26.08 kg/m   Constitutional:  Alert and oriented, No acute distress.  Accompanied by wife today. HEENT: [place] AT, moist mucus membranes.  Trachea midline, no masses. Cardiovascular: No clubbing, cyanosis, or edema. Respiratory: Normal respiratory effort, no increased work of breathing. GI: Abdomen is soft, nontender, nondistended, no abdominal masses GU: Normal circumcised phallus with Foley catheter in place draining purulent light pink urine with debris.  No clots. Skin: No rashes, bruises or suspicious lesions. Neurologic: Grossly intact, no focal deficits, moving all 4 extremities. Psychiatric: Normal mood and affect.  Laboratory Data: Lab Results  Component Value Date   WBC 10.9 (H) 06/04/2017   HGB 13.2 06/04/2017   HCT 38.4 (L) 06/04/2017   MCV 86.0 06/04/2017   PLT 371 06/04/2017    Lab Results  Component Value Date   CREATININE 1.27 (H) 06/04/2017   Urinalysis UA pending  Pertinent Imaging: n/a   Assessment & Plan:    1. BPH with urinary obstruction Continue Flomax 0.8 mg and finasteride Currently on maximal medical  therapy with persistent urinary symptoms/UTI/urinary retention  2. Urine retention Currently with indwelling Foley catheter Given appearance urine today, I am highly suspicious for infection which will likely impact his ability to void today Plan for voiding trial next week with CIC teaching as possible Plan for cystoscopy in a few weeks to assess bladder prostate in setting of refractory urinary symptoms/tension as he may be candidate for an outlet procedure in the future  3. Recurrent UTI UA urine culture We will go ahead and treat with Bactrim and adjust as needed - CULTURE, URINE COMPREHENSIVE - Urinalysis, Complete   Return in about 2 weeks (around 12/12/2017) for cysto (VT in 1 week).  [name], MD  [facility] [address], [place], [place] [address] [phone]

## 2017-11-30 LAB — CULTURE, URINE COMPREHENSIVE

## 2017-12-06 ENCOUNTER — Ambulatory Visit: Payer: Medicare Other

## 2017-12-07 ENCOUNTER — Ambulatory Visit (INDEPENDENT_AMBULATORY_CARE_PROVIDER_SITE_OTHER): Payer: Medicare Other

## 2017-12-07 VITALS — BP 116/70 | HR 125 | Ht 71.0 in | Wt 185.0 lb

## 2017-12-07 DIAGNOSIS — N138 Other obstructive and reflux uropathy: Secondary | ICD-10-CM | POA: Diagnosis not present

## 2017-12-07 DIAGNOSIS — N401 Enlarged prostate with lower urinary tract symptoms: Secondary | ICD-10-CM

## 2017-12-07 NOTE — Progress Notes (Signed)
Catheter Removal  Patient is present today for a catheter removal.  24ml of water was drained from the balloon. A 18FR foley cath was removed from the bladder no complications were noted . Patient tolerated well.  Preformed by: Toniann Fail, LPN   Follow up/ Additional notes: Pt will RTC today by 3pm for bladder scan and possible CIC teaching.   Blood pressure 116/70, pulse (!) 125, height 5\' 11"  (1.803 m), weight 185 lb (83.9 kg).  Pt attempted CIC without success. Therefore a 58fr coude foley was replaced. Pt tolerated well. Pt will RTC next week for cysto.

## 2017-12-08 ENCOUNTER — Other Ambulatory Visit: Payer: Self-pay

## 2017-12-08 DIAGNOSIS — N138 Other obstructive and reflux uropathy: Secondary | ICD-10-CM

## 2017-12-08 DIAGNOSIS — N401 Enlarged prostate with lower urinary tract symptoms: Principal | ICD-10-CM

## 2017-12-08 MED ORDER — TAMSULOSIN HCL 0.4 MG PO CAPS: 0.4000 mg | ORAL_CAPSULE | Freq: Two times a day (BID) | ORAL | 11 refills | Status: DC

## 2017-12-11 ENCOUNTER — Other Ambulatory Visit: Payer: Self-pay

## 2017-12-11 ENCOUNTER — Encounter: Payer: Self-pay | Admitting: *Deleted

## 2017-12-11 ENCOUNTER — Emergency Department
Admission: EM | Admit: 2017-12-11 | Discharge: 2017-12-12 | Disposition: A | Discharge status: Home / Self Care | Payer: Medicare Other | Attending: Emergency Medicine | Admitting: Emergency Medicine

## 2017-12-11 DIAGNOSIS — I10 Essential (primary) hypertension: Secondary | ICD-10-CM | POA: Insufficient documentation

## 2017-12-11 DIAGNOSIS — T839XXA Unspecified complication of genitourinary prosthetic device, implant and graft, initial encounter: Secondary | ICD-10-CM | POA: Insufficient documentation

## 2017-12-11 DIAGNOSIS — Y69 Unspecified misadventure during surgical and medical care: Secondary | ICD-10-CM | POA: Insufficient documentation

## 2017-12-11 DIAGNOSIS — N390 Urinary tract infection, site not specified: Secondary | ICD-10-CM | POA: Insufficient documentation

## 2017-12-11 DIAGNOSIS — Z8546 Personal history of malignant neoplasm of prostate: Secondary | ICD-10-CM | POA: Diagnosis not present

## 2017-12-11 DIAGNOSIS — Z79899 Other long term (current) drug therapy: Secondary | ICD-10-CM | POA: Diagnosis not present

## 2017-12-11 LAB — URINALYSIS, ROUTINE W REFLEX MICROSCOPIC
Bacteria, UA: NONE SEEN
Bilirubin Urine: NEGATIVE
GLUCOSE, UA: NEGATIVE mg/dL
Ketones, ur: NEGATIVE mg/dL
NITRITE: NEGATIVE
PH: 6 (ref 5.0–8.0)
PROTEIN: 100 mg/dL — AB
SPECIFIC GRAVITY, URINE: 1.011 (ref 1.005–1.030)
Squamous Epithelial / LPF: NONE SEEN

## 2017-12-11 NOTE — ED Provider Notes (Signed)
[facility] Emergency Department Provider Note  Time seen: 9:51 PM  I have reviewed the triage vital signs and the nursing notes.   HISTORY  Chief Complaint foley catheter leaking    HPI Vincent Poole is a 83 y.o. male with a past medical history of BPH, urinary retention with a chronic indwelling Foley catheter presents to the emergency department for urine leakage.  According to the patient and his wife the patient had a Foley catheter placed 2 weeks ago, it was removed and replaced 4 days ago.  Had been doing well until the past 24 hours where he has had significant leakage around the Foley catheter, leaking onto his close.  States he continues to be a mild amount of urine going into the catheter bag with most of the urine goes around the catheter onto is clothes.  Denies any current lower abdominal pain but states occasionally he will have spasm type pain in the lower abdomen.  Denies any nausea vomiting or diarrhea.  No fever.  Largely negative review of systems.   Past Medical History:  Diagnosis Date  . Anxiety   . Arthritis   . Prostate cancer [facility])     Patient Active Problem List   Diagnosis Date Noted  . Pyelonephritis 05/31/2017  . UTI (urinary tract infection) 03/04/2017  . Urinary obstruction   . Essential hypertension 08/30/2016  . History of shingles 08/30/2016  . Prostate cancer ([place]) 08/30/2016  . Urinary retention 08/30/2016  . Medicare annual wellness visit, initial 08/01/2016  . Medicare annual wellness visit, subsequent 08/01/2016  . Sepsis ([place]) 07/11/2016  . Borderline diabetes mellitus 01/26/2016  . Vaccine counseling 01/26/2015  . Lumbar radiculitis 06/24/2014  . OA (osteoarthritis) of hip 06/24/2014  . Malignant neoplasm of prostate ([place]) 01/27/2011    Past Surgical History:  Procedure Laterality Date  . APPENDECTOMY    . CHOLECYSTECTOMY      Prior to Admission medications   Medication Sig Start Date End Date  Taking? Authorizing Provider  acetaminophen (TYLENOL) 500 MG tablet Take 500 mg by mouth every 6 (six) hours as needed.    [provider]  Cranberry-Vitamin C (CRANBERRY CONCENTRATE/VITAMINC) 15000-100 MG CAPS Take by mouth.    [provider]  finasteride (PROSCAR) 5 MG tablet Take 1 tablet (5 mg total) by mouth daily. 11/28/16   Ardis Hughs, MD  lisinopril-hydrochlorothiazide (PRINZIDE,ZESTORETIC) 10-12.5 MG per tablet Take 0.5 tablets by mouth daily.  07/03/12   [provider]  phenazopyridine (PYRIDIUM) 100 MG tablet Take 1 tablet (100 mg total) by mouth 3 (three) times daily as needed for pain. Patient not taking: Reported on 11/28/2017 06/02/17   Gregor Hams, MD  potassium chloride SA (K-DUR,KLOR-CON) 20 MEQ tablet Take 1 tablet by mouth daily. 06/07/17 06/07/18  [provider]  sulfamethoxazole-trimethoprim (BACTRIM DS,SEPTRA DS) 800-160 MG tablet Take 1 tablet by mouth every 12 (twelve) hours. 11/28/17   [name], MD  tamsulosin (FLOMAX) 0.4 MG CAPS capsule Take 1 capsule (0.4 mg total) by mouth 2 (two) times daily. 12/08/17   [name], MD  trimethoprim (TRIMPEX) 100 MG tablet Take 1 tablet (100 mg total) by mouth daily. Patient not taking: Reported on 11/28/2017 07/04/17   [name], MD    Allergies  Allergen Reactions  . Penicillins Other (See Comments)    Caused nervousness    Family History  Problem Relation Age of Onset  . Cancer Mother   . Chronic Renal Failure Mother   .  Heart disease Father     Social History Social History   Tobacco Use  . Smoking status: Never Smoker  . Smokeless tobacco: Never Used  Substance Use Topics  . Alcohol use: No    Alcohol/week: 0.0 oz  . Drug use: No    Review of Systems Constitutional: Negative for fever. Eyes: Negative for visual complaints ENT: Negative for recent illness Cardiovascular: Negative for chest pain. Respiratory: Negative for shortness of  breath. Gastrointestinal: Bladder Spasm/Lower Abdominal Pain, None Currently. Genitourinary: Foley catheter leaking. Musculoskeletal: Negative for musculoskeletal complaints Skin: Negative for skin complaints  Neurological: Negative for headache All other ROS negative  ____________________________________________   PHYSICAL EXAM:  VITAL SIGNS: ED Triage Vitals  Enc Vitals Group     BP 12/11/17 2050 107/76     Pulse Rate 12/11/17 2050 (!) 117     Resp 12/11/17 2050 20     Temp 12/11/17 2050 98 F (36.7 C)     Temp Source 12/11/17 2050 Oral     SpO2 12/11/17 2050 100 %     Weight 12/11/17 2050 185 lb (83.9 kg)     Height 12/11/17 2055 5\' 11"  (1.803 m)     Head Circumference --      Peak Flow --      Pain Score 12/11/17 2055 5     Pain Loc --      Pain Edu? --      Excl. in [place]? --    Constitutional: Alert. Well appearing and in no distress. Eyes: Normal exam ENT   Head: Normocephalic and atraumatic.   Mouth/Throat: Mucous membranes are moist. Cardiovascular: Normal rate, regular rhythm. No murmur Respiratory: Normal respiratory effort without tachypnea nor retractions. Breath sounds are clear  Gastrointestinal: Soft and nontender. No distention.  Genitourinary: Foley catheter present, mild urine drainage around the catheter. Musculoskeletal: Nontender with normal range of motion in all extremities.  Neurologic:  Normal speech and language. No gross focal neurologic deficits  Skin:  Skin is warm, dry and intact.  Psychiatric: Mood and affect are normal.   ____________________________________________   INITIAL IMPRESSION / ASSESSMENT AND PLAN / ED COURSE  Pertinent labs & imaging results that were available during my care of the patient were reviewed by me and considered in my medical decision making (see chart for details).  Patient presents to the emergency department with leakage around his Foley catheter.  Differential would include urinary retention with  overflow leakage, Foley catheter balloon not inflated properly, and appropriate size catheter.  We will check a bladder scan, remove and replace the Foley catheter and check a urinalysis and urine culture.  Patient had approximately 250 cc of urine in his bladder after the catheter was exchanged.  Now draining freely, no abdominal discomfort after new Foley catheter inserted.  Too numerous to count white and red cells but no bacteria sent.  We will hold off on antibiotics at this time but I have sent a urine culture.  Patient will be discharged home, has follow-up next week.  ____________________________________________   FINAL CLINICAL IMPRESSION(S) / ED DIAGNOSES  foley catheter     Harvest Dark, MD 12/11/17 2314

## 2017-12-11 NOTE — ED Notes (Signed)
Bladder scan pt greater than 228 in bladder

## 2017-12-11 NOTE — ED Triage Notes (Signed)
Pt has a foley catheter in place for 5 days.  Pt reports leakage and pain.  Pt alert.

## 2017-12-12 NOTE — ED Notes (Signed)
Pt taken to POV in wheelchair. Foley in place still draining urine. 350 mls emptied before being discharged. VSS. NAD. Discharge instructions and follow up reviewed.

## 2017-12-13 LAB — URINE CULTURE: Culture: NO GROWTH

## 2017-12-14 ENCOUNTER — Ambulatory Visit: Payer: Medicare Other | Admitting: Urology

## 2017-12-14 ENCOUNTER — Other Ambulatory Visit: Payer: Self-pay | Admitting: Radiology

## 2017-12-14 ENCOUNTER — Encounter: Payer: Self-pay | Admitting: Urology

## 2017-12-14 VITALS — BP 99/64 | HR 109 | Ht 71.0 in | Wt 185.0 lb

## 2017-12-14 DIAGNOSIS — N329 Bladder disorder, unspecified: Secondary | ICD-10-CM

## 2017-12-14 DIAGNOSIS — N390 Urinary tract infection, site not specified: Secondary | ICD-10-CM

## 2017-12-14 DIAGNOSIS — N400 Enlarged prostate without lower urinary tract symptoms: Secondary | ICD-10-CM

## 2017-12-14 DIAGNOSIS — R8290 Unspecified abnormal findings in urine: Secondary | ICD-10-CM

## 2017-12-14 MED ORDER — CIPROFLOXACIN HCL 500 MG PO TABS
500.0000 mg | ORAL_TABLET | Freq: Once | ORAL | Status: AC
  Administered 2017-12-14: 500 mg via ORAL

## 2017-12-14 MED ORDER — LIDOCAINE HCL 2 % EX GEL
1.0000 "application " | Freq: Once | CUTANEOUS | Status: AC
  Administered 2017-12-14: 1 via URETHRAL

## 2017-12-14 NOTE — Progress Notes (Signed)
   12/14/17  CC:  Chief Complaint  Patient presents with  . Cysto    HPI: 83 year old male with recurrent urinary tract infection on suppressive abx, urinary retention, and episode of gross hematuria returns today for cystoscopic evaluation.  Since last visit, he was seen in the emergency room with a discharge the catheter on 12/11/2017.  This was presumably obstructed secondary to this is been an ongoing issue since the catheter was placed.  He denies any significant abdominal pain.  He is a relatively poor historian.  He is accompanied today by his wife.  Most recent noncontrast CT scan on 05/2017 did show marked bladder wall thickening and perivascular edema with ascending inflammation of the right ureter greater than the left with inflammatory changes around the kidneys.  This is felt to be consistent with pyelonephritis at the time.   Blood pressure 99/64, pulse (!) 109, height 5\' 11"  (1.803 m), weight 185 lb (83.9 kg). NED.  Wife at bedside. Extremely malodorous No respiratory distress   Abd soft, NT, ND Normal phallus with bilateral descended testicles  Cystoscopy Procedure Note  Patient identification was confirmed, informed consent was obtained, and patient was prepped using Betadine solution.  Lidocaine jelly was administered per urethral meatus.    Preoperative abx where received prior to procedure.     Pre-Procedure: - Inspection reveals a normal caliber ureteral meatus.  Procedure: The flexible cystoscope was introduced without difficulty - No urethral strictures/lesions are present. -Prostate is grossly abnormal with necrotic material lining the entire surface area -Diffuse copious debris within the bladder which appears to be erythematous without discrete bladder tumor although visualization is very poor today   Post-Procedure: - Patient tolerated the procedure well  Assessment/ Plan:  1. Recurrent UTI Grossly abnormal bladder and prostate today with  copious amounts of debris, visualization very poor I am highly suspicious for neoplastic process I recommended proceeding to the operating room for cystoscopy primarily to improve visualization, biopsy of the prostate and bladder along with bilateral retrograde pyelogram.  This is primarily for diagnostic purposes.  Both he and his wife understand this.  Risk of bleeding, infection, damage to internal structures, continued need for Foley catheterization were all reviewed in detail.  They are agreeable with this plan.  Preop urine culture obtained - ciprofloxacin (CIPRO) tablet 500 mg - lidocaine (XYLOCAINE) 2 % jelly 1 application - CULTURE, URINE COMPREHENSIVE - CT Abdomen Pelvis W Contrast; Future  2. Lesion of bladder As above  3. Malodorous urine Patient is extremely malodorous today, is unclear whether this is coming from his urine or another source Patient's wife reports that he bathes himself daily Concern for possible fistula, will pursue contrast CT abdomen pelvis  [name], MD

## 2017-12-14 NOTE — Progress Notes (Signed)
Catheter Removal  Patient is present today for a catheter removal.  36ml of water was drained from the balloon. A 16FR foley cath was removed from the bladder no complications were noted . Patient tolerated well.  Preformed by: Fonnie Jarvis, CMA  Simple Catheter Placement  Due to urinary retention patient is present today for a foley cath placement.  Patient was cleaned and prepped in a sterile fashion with betadine and lidocaine jelly 2% was instilled into the urethra.  A 16 coude FR foley catheter was inserted, urine return was not noted.  The balloon was filled with 10cc of sterile water. Cath was irrigated with 120cc of sterile water to confirm placement.  A night bag was attached for drainage. Patient was also given a night bag to take home and was given instruction on how to change from one bag to another.  Patient was given instruction on proper catheter care.  Patient tolerated well, complications were noted as: patient having bladder spasms during placement and irrigation   Preformed by: Fonnie Jarvis, CMA

## 2017-12-15 ENCOUNTER — Other Ambulatory Visit: Payer: Self-pay | Admitting: Radiology

## 2017-12-15 DIAGNOSIS — N329 Bladder disorder, unspecified: Secondary | ICD-10-CM

## 2017-12-19 ENCOUNTER — Ambulatory Visit: Payer: Medicare Other | Admitting: Radiation Oncology

## 2017-12-19 ENCOUNTER — Inpatient Hospital Stay: Payer: Medicare Other | Attending: Radiation Oncology

## 2017-12-19 DIAGNOSIS — Z8546 Personal history of malignant neoplasm of prostate: Secondary | ICD-10-CM | POA: Diagnosis not present

## 2017-12-19 DIAGNOSIS — C679 Malignant neoplasm of bladder, unspecified: Secondary | ICD-10-CM | POA: Insufficient documentation

## 2017-12-19 DIAGNOSIS — C61 Malignant neoplasm of prostate: Secondary | ICD-10-CM

## 2017-12-19 LAB — CULTURE, URINE COMPREHENSIVE

## 2017-12-19 LAB — PSA: PROSTATIC SPECIFIC ANTIGEN: 0.03 ng/mL (ref 0.00–4.00)

## 2017-12-20 ENCOUNTER — Ambulatory Visit
Admission: RE | Admit: 2017-12-20 | Discharge: 2017-12-20 | Disposition: A | Discharge status: Home / Self Care | Payer: Medicare Other | Source: Ambulatory Visit | Attending: Urology | Admitting: Urology

## 2017-12-20 DIAGNOSIS — I700 Atherosclerosis of aorta: Secondary | ICD-10-CM | POA: Insufficient documentation

## 2017-12-20 DIAGNOSIS — I2510 Atherosclerotic heart disease of native coronary artery without angina pectoris: Secondary | ICD-10-CM | POA: Insufficient documentation

## 2017-12-20 DIAGNOSIS — R600 Localized edema: Secondary | ICD-10-CM | POA: Diagnosis not present

## 2017-12-20 DIAGNOSIS — K449 Diaphragmatic hernia without obstruction or gangrene: Secondary | ICD-10-CM | POA: Insufficient documentation

## 2017-12-20 DIAGNOSIS — N400 Enlarged prostate without lower urinary tract symptoms: Secondary | ICD-10-CM | POA: Diagnosis present

## 2017-12-20 DIAGNOSIS — N329 Bladder disorder, unspecified: Secondary | ICD-10-CM | POA: Insufficient documentation

## 2017-12-20 LAB — POCT I-STAT CREATININE: CREATININE: 0.9 mg/dL (ref 0.61–1.24)

## 2017-12-20 MED ORDER — IOPAMIDOL (ISOVUE-300) INJECTION 61%
100.0000 mL | Freq: Once | INTRAVENOUS | Status: AC | PRN
  Administered 2017-12-20: 100 mL via INTRAVENOUS

## 2017-12-21 ENCOUNTER — Other Ambulatory Visit: Payer: Self-pay

## 2017-12-21 ENCOUNTER — Encounter
Admission: RE | Admit: 2017-12-21 | Discharge: 2017-12-21 | Disposition: A | Discharge status: Home / Self Care | Payer: Medicare Other | Source: Ambulatory Visit | Attending: Urology | Admitting: Urology

## 2017-12-21 DIAGNOSIS — R319 Hematuria, unspecified: Secondary | ICD-10-CM | POA: Diagnosis not present

## 2017-12-21 DIAGNOSIS — Z01812 Encounter for preprocedural laboratory examination: Secondary | ICD-10-CM | POA: Diagnosis not present

## 2017-12-21 DIAGNOSIS — Z01810 Encounter for preprocedural cardiovascular examination: Secondary | ICD-10-CM

## 2017-12-21 DIAGNOSIS — Z87440 Personal history of urinary (tract) infections: Secondary | ICD-10-CM | POA: Diagnosis not present

## 2017-12-21 DIAGNOSIS — R339 Retention of urine, unspecified: Secondary | ICD-10-CM | POA: Insufficient documentation

## 2017-12-21 DIAGNOSIS — I10 Essential (primary) hypertension: Secondary | ICD-10-CM

## 2017-12-21 DIAGNOSIS — N329 Bladder disorder, unspecified: Secondary | ICD-10-CM | POA: Diagnosis not present

## 2017-12-21 HISTORY — DX: Retention of urine, unspecified: R33.9

## 2017-12-21 HISTORY — DX: Urinary tract infection, site not specified: N39.0

## 2017-12-21 HISTORY — DX: Essential (primary) hypertension: I10

## 2017-12-21 LAB — BASIC METABOLIC PANEL
Anion gap: 9 (ref 5–15)
BUN: 13 mg/dL (ref 6–20)
CO2: 25 mmol/L (ref 22–32)
Calcium: 8.8 mg/dL — ABNORMAL LOW (ref 8.9–10.3)
Chloride: 103 mmol/L (ref 101–111)
Creatinine, Ser: 0.88 mg/dL (ref 0.61–1.24)
GFR calc Af Amer: >60 mL/min (ref >60)
Glucose, Bld: 117 mg/dL — ABNORMAL HIGH (ref 65–99)
POTASSIUM: 3.6 mmol/L (ref 3.5–5.1)
Sodium: 137 mmol/L (ref 135–145)

## 2017-12-21 LAB — CBC WITH DIFFERENTIAL/PLATELET
Basophils Absolute: 0.1 10*3/uL (ref 0–0.1)
Basophils Relative: 1 %
EOS PCT: 2 %
Eosinophils Absolute: 0.3 10*3/uL (ref 0–0.7)
HCT: 36.6 % — ABNORMAL LOW (ref 40.0–52.0)
Hemoglobin: 12 g/dL — ABNORMAL LOW (ref 13.0–18.0)
LYMPHS ABS: 1.4 10*3/uL (ref 1.0–3.6)
LYMPHS PCT: 10 %
MCH: 28.8 pg (ref 26.0–34.0)
MCHC: 32.7 g/dL (ref 32.0–36.0)
MCV: 88.1 fL (ref 80.0–100.0)
MONO ABS: 1.1 10*3/uL — AB (ref 0.2–1.0)
MONOS PCT: 8 %
Neutro Abs: 11.1 10*3/uL — ABNORMAL HIGH (ref 1.4–6.5)
Neutrophils Relative %: 79 %
PLATELETS: 324 10*3/uL (ref 150–440)
RBC: 4.16 MIL/uL — AB (ref 4.40–5.90)
RDW: 15.6 % — AB (ref 11.5–14.5)
WBC: 13.9 10*3/uL — ABNORMAL HIGH (ref 3.8–10.6)

## 2017-12-21 NOTE — Patient Instructions (Signed)
Your procedure is scheduled on: Friday, February 22  Report to [place]  To find out your arrival time please call [phone] between 1PM - 3PM on Thursday, February 21   Remember: Instructions that are not followed completely may result in  serious medical risk, up to and including death, or upon the discretion  of your surgeon and anesthesiologist your surgery may need to be rescheduled.     _X__ 1. Do not eat food after midnight the night before your procedure.                 No gum chewing, lozengers or hard candies.                   You may drink clear liquids up to 2 hours                 before you are scheduled to arrive for your surgery-                   Clear Liquids include:  water, apple juice without pulp, clear carbohydrate                 drink such as Clearfast of Gartorade, Black Coffee or Tea (Do not add                 anything to coffee or tea).  __X__2.  On the morning of surgery brush your teeth with toothpaste and water,                          you may rinse your mouth with mouthwash if you wish.                                   Do not swallow any toothpaste of mouthwash.     _X__ 3.  No Alcohol for 24 hours before or after surgery.   _X__ 4.  Do Not Smoke or use e-cigarettes For 24 Hours Prior to Your Surgery.                 Do not use any chewable tobacco products for at least 6 hours prior to                 surgery.  ____  5.  Bring all medications with you on the day of surgery if instructed.   _x___  6.  Notify your doctor if there is any change in your medical condition      (cold, fever, infections).     Do not wear jewelry, make-up, hairpins, clips or nail polish. Do not wear lotions, powders, or perfumes. You may wear deodorant. Do not shave 48 hours prior to surgery. Men may shave face and neck. Do not bring valuables to the hospital.    [facility] is not responsible for any belongings or  valuables.  Contacts, dentures or bridgework may not be worn into surgery. Leave your suitcase in the car. After surgery it may be brought to your room. For patients admitted to the hospital, discharge time is determined by your treatment team.   Patients discharged the day of surgery will not be allowed to drive home.   Please read over the following fact sheets that you were given:   PREPARING FOR SURGERY   ____ Take these medicines the  morning of surgery with A SIP OF WATER:    1.  FINASTERIDE  2.  TAMSULOSIN  3.   4.  5.  6.  ____ Fleet Enema (as directed)   ____ SHOWER MORNING OF SURGERY OR THE NIGHT BEFORE  ____ Use inhalers on the day of surgery  _X___ Stop ASPIRIN PRODUCTS NOW!!  _X___ Stop Anti-inflammatories NOW!!                 THIS INCLUDES IBUPROFEN / MOTRIN / ADVIL / ALEVE                       NAPROSYN   __X__ Stop supplements until after surgery.  THIS INCLUDES CRANBERRY  ____ Bring C-Pap to the hospital.   [place].

## 2017-12-21 NOTE — OR Nursing (Signed)
Saw patient in Pre-Admit testing today. Complains of pain from the foley catheter, which is constant.  Urine is cloudy with a pink tinge to it. Eager to have this removed.

## 2017-12-22 NOTE — Pre-Procedure Instructions (Signed)
Abnormal urinalysis faxed to Dr Cherrie Gauze office.

## 2017-12-25 ENCOUNTER — Telehealth: Payer: Self-pay | Admitting: Radiology

## 2017-12-25 NOTE — Telephone Encounter (Signed)
-----   Message from [name], MD sent at 12/24/2017  1:26 PM EST ----- That's how it was in the office too.  UCx neg x 2.  Nothing to do.    [name], MD  ----- Message ----- From: Ranell Patrick, RN Sent: 12/22/2017   1:48 PM To: [name], MD  Per pre-admit testing nurse, urine in foley was pink, cloudy & thick appearing at appt on 12/21/2017. Surgery is scheduled 12/27/2017. Please advise.

## 2017-12-26 ENCOUNTER — Encounter: Payer: Self-pay | Admitting: Emergency Medicine

## 2017-12-26 ENCOUNTER — Other Ambulatory Visit: Payer: Self-pay

## 2017-12-26 ENCOUNTER — Ambulatory Visit
Admission: RE | Admit: 2017-12-26 | Discharge: 2017-12-26 | Disposition: A | Discharge status: Home / Self Care | Payer: Medicare Other | Source: Ambulatory Visit | Attending: Radiation Oncology | Admitting: Radiation Oncology

## 2017-12-26 ENCOUNTER — Telehealth: Payer: Self-pay

## 2017-12-26 ENCOUNTER — Emergency Department: Payer: Medicare Other

## 2017-12-26 ENCOUNTER — Encounter: Payer: Self-pay | Admitting: Radiation Oncology

## 2017-12-26 ENCOUNTER — Inpatient Hospital Stay
Admission: EM | Admit: 2017-12-26 | Discharge: 2017-12-30 | DRG: 659 | Disposition: A | Discharge status: Home Health | Payer: Medicare Other | Attending: Internal Medicine | Admitting: Internal Medicine

## 2017-12-26 VITALS — BP 138/83 | HR 110 | Temp 96.2°F | Resp 20 | Wt 184.1 lb

## 2017-12-26 DIAGNOSIS — C675 Malignant neoplasm of bladder neck: Secondary | ICD-10-CM | POA: Diagnosis present

## 2017-12-26 DIAGNOSIS — Z8546 Personal history of malignant neoplasm of prostate: Secondary | ICD-10-CM | POA: Insufficient documentation

## 2017-12-26 DIAGNOSIS — T83511A Infection and inflammatory reaction due to indwelling urethral catheter, initial encounter: Secondary | ICD-10-CM | POA: Diagnosis present

## 2017-12-26 DIAGNOSIS — M1990 Unspecified osteoarthritis, unspecified site: Secondary | ICD-10-CM | POA: Diagnosis present

## 2017-12-26 DIAGNOSIS — C678 Malignant neoplasm of overlapping sites of bladder: Secondary | ICD-10-CM | POA: Diagnosis not present

## 2017-12-26 DIAGNOSIS — N136 Pyonephrosis: Secondary | ICD-10-CM | POA: Diagnosis present

## 2017-12-26 DIAGNOSIS — G8918 Other acute postprocedural pain: Secondary | ICD-10-CM | POA: Diagnosis not present

## 2017-12-26 DIAGNOSIS — Z880 Allergy status to penicillin: Secondary | ICD-10-CM | POA: Diagnosis not present

## 2017-12-26 DIAGNOSIS — N329 Bladder disorder, unspecified: Secondary | ICD-10-CM | POA: Diagnosis not present

## 2017-12-26 DIAGNOSIS — Z841 Family history of disorders of kidney and ureter: Secondary | ICD-10-CM

## 2017-12-26 DIAGNOSIS — Z87440 Personal history of urinary (tract) infections: Secondary | ICD-10-CM

## 2017-12-26 DIAGNOSIS — C61 Malignant neoplasm of prostate: Secondary | ICD-10-CM

## 2017-12-26 DIAGNOSIS — R9341 Abnormal radiologic findings on diagnostic imaging of renal pelvis, ureter, or bladder: Secondary | ICD-10-CM | POA: Insufficient documentation

## 2017-12-26 DIAGNOSIS — I10 Essential (primary) hypertension: Secondary | ICD-10-CM | POA: Diagnosis present

## 2017-12-26 DIAGNOSIS — N390 Urinary tract infection, site not specified: Secondary | ICD-10-CM

## 2017-12-26 DIAGNOSIS — Z8249 Family history of ischemic heart disease and other diseases of the circulatory system: Secondary | ICD-10-CM | POA: Diagnosis not present

## 2017-12-26 DIAGNOSIS — Z923 Personal history of irradiation: Secondary | ICD-10-CM | POA: Insufficient documentation

## 2017-12-26 DIAGNOSIS — N403 Nodular prostate with lower urinary tract symptoms: Secondary | ICD-10-CM | POA: Diagnosis not present

## 2017-12-26 DIAGNOSIS — A419 Sepsis, unspecified organism: Secondary | ICD-10-CM | POA: Diagnosis present

## 2017-12-26 DIAGNOSIS — Z9049 Acquired absence of other specified parts of digestive tract: Secondary | ICD-10-CM | POA: Diagnosis not present

## 2017-12-26 DIAGNOSIS — R338 Other retention of urine: Secondary | ICD-10-CM | POA: Diagnosis not present

## 2017-12-26 DIAGNOSIS — Z809 Family history of malignant neoplasm, unspecified: Secondary | ICD-10-CM

## 2017-12-26 DIAGNOSIS — N179 Acute kidney failure, unspecified: Secondary | ICD-10-CM | POA: Diagnosis present

## 2017-12-26 DIAGNOSIS — N1330 Unspecified hydronephrosis: Secondary | ICD-10-CM | POA: Diagnosis not present

## 2017-12-26 DIAGNOSIS — G4700 Insomnia, unspecified: Secondary | ICD-10-CM | POA: Diagnosis present

## 2017-12-26 LAB — URINALYSIS, COMPLETE (UACMP) WITH MICROSCOPIC
BACTERIA UA: NONE SEEN
Glucose, UA: NEGATIVE mg/dL
Hgb urine dipstick: NEGATIVE
Ketones, ur: NEGATIVE mg/dL
Nitrite: NEGATIVE
SPECIFIC GRAVITY, URINE: 1.015 (ref 1.005–1.030)
SQUAMOUS EPITHELIAL / LPF: NONE SEEN
pH: 8 (ref 5.0–8.0)

## 2017-12-26 LAB — CBC WITH DIFFERENTIAL/PLATELET
BASOS ABS: 0 10*3/uL (ref 0–0.1)
BASOS PCT: 0 %
EOS ABS: 0 10*3/uL (ref 0–0.7)
EOS PCT: 0 %
HCT: 37.1 % — ABNORMAL LOW (ref 40.0–52.0)
HEMOGLOBIN: 12.4 g/dL — AB (ref 13.0–18.0)
LYMPHS ABS: 0.4 10*3/uL — AB (ref 1.0–3.6)
Lymphocytes Relative: 2 %
MCH: 29.2 pg (ref 26.0–34.0)
MCHC: 33.4 g/dL (ref 32.0–36.0)
MCV: 87.4 fL (ref 80.0–100.0)
Monocytes Absolute: 1.2 10*3/uL — ABNORMAL HIGH (ref 0.2–1.0)
Monocytes Relative: 7 %
NEUTROS PCT: 91 %
Neutro Abs: 15.8 10*3/uL — ABNORMAL HIGH (ref 1.4–6.5)
PLATELETS: 399 10*3/uL (ref 150–440)
RBC: 4.24 MIL/uL — AB (ref 4.40–5.90)
RDW: 15.4 % — ABNORMAL HIGH (ref 11.5–14.5)
WBC: 17.4 10*3/uL — AB (ref 3.8–10.6)

## 2017-12-26 LAB — LACTIC ACID, PLASMA
LACTIC ACID, VENOUS: 1.8 mmol/L (ref 0.5–1.9)
Lactic Acid, Venous: 1.2 mmol/L (ref 0.5–1.9)

## 2017-12-26 LAB — BASIC METABOLIC PANEL
ANION GAP: 11 (ref 5–15)
BUN: 20 mg/dL (ref 6–20)
CO2: 22 mmol/L (ref 22–32)
Calcium: 9 mg/dL (ref 8.9–10.3)
Chloride: 103 mmol/L (ref 101–111)
Creatinine, Ser: 1.31 mg/dL — ABNORMAL HIGH (ref 0.61–1.24)
GFR, EST AFRICAN AMERICAN: 58 mL/min — AB (ref >60)
GFR, EST NON AFRICAN AMERICAN: 50 mL/min — AB (ref >60)
Glucose, Bld: 157 mg/dL — ABNORMAL HIGH (ref 65–99)
POTASSIUM: 3.7 mmol/L (ref 3.5–5.1)
SODIUM: 136 mmol/L (ref 135–145)

## 2017-12-26 LAB — APTT: aPTT: 34 seconds (ref 24–36)

## 2017-12-26 LAB — PROTIME-INR
INR: 1.21
PROTHROMBIN TIME: 15.2 s (ref 11.4–15.2)

## 2017-12-26 LAB — PROCALCITONIN: PROCALCITONIN: 0.25 ng/mL

## 2017-12-26 LAB — INFLUENZA PANEL BY PCR (TYPE A & B)
INFLAPCR: NEGATIVE
INFLBPCR: NEGATIVE

## 2017-12-26 MED ORDER — SODIUM CHLORIDE 0.9 % IV SOLN
INTRAVENOUS | Status: DC
  Administered 2017-12-26 – 2017-12-29 (×6): via INTRAVENOUS

## 2017-12-26 MED ORDER — PIPERACILLIN-TAZOBACTAM 3.375 G IVPB 30 MIN
3.3750 g | Freq: Once | INTRAVENOUS | Status: AC
  Administered 2017-12-26: 3.375 g via INTRAVENOUS
  Filled 2017-12-26: qty 50

## 2017-12-26 MED ORDER — FINASTERIDE 5 MG PO TABS
5.0000 mg | ORAL_TABLET | Freq: Every day | ORAL | Status: DC
  Administered 2017-12-27 – 2017-12-30 (×4): 5 mg via ORAL
  Filled 2017-12-26 (×4): qty 1

## 2017-12-26 MED ORDER — ONDANSETRON HCL 4 MG PO TABS: 4.0000 mg | ORAL_TABLET | Freq: Four times a day (QID) | ORAL | Status: DC | PRN

## 2017-12-26 MED ORDER — SODIUM CHLORIDE 0.9 % IV SOLN
1.0000 g | Freq: Three times a day (TID) | INTRAVENOUS | Status: DC
  Administered 2017-12-26 – 2017-12-27 (×2): 1 g via INTRAVENOUS
  Filled 2017-12-26 (×4): qty 1

## 2017-12-26 MED ORDER — LEVOFLOXACIN IN D5W 750 MG/150ML IV SOLN
750.0000 mg | INTRAVENOUS | Status: DC
  Administered 2017-12-26: 22:00:00 750 mg via INTRAVENOUS
  Filled 2017-12-26: qty 150

## 2017-12-26 MED ORDER — ACETAMINOPHEN 325 MG PO TABS
650.0000 mg | ORAL_TABLET | Freq: Once | ORAL | Status: AC
  Administered 2017-12-26: 650 mg via ORAL
  Filled 2017-12-26: qty 2

## 2017-12-26 MED ORDER — ONDANSETRON HCL 4 MG/2ML IJ SOLN: 4.0000 mg | Freq: Four times a day (QID) | INTRAMUSCULAR | Status: DC | PRN

## 2017-12-26 MED ORDER — ACETAMINOPHEN 650 MG RE SUPP: 650.0000 mg | Freq: Four times a day (QID) | RECTAL | Status: DC | PRN

## 2017-12-26 MED ORDER — ACETAMINOPHEN 325 MG PO TABS
650.0000 mg | ORAL_TABLET | Freq: Four times a day (QID) | ORAL | Status: DC | PRN
  Administered 2017-12-28: 650 mg via ORAL
  Filled 2017-12-26: qty 2

## 2017-12-26 MED ORDER — ALBUTEROL SULFATE (2.5 MG/3ML) 0.083% IN NEBU: 2.5000 mg | INHALATION_SOLUTION | RESPIRATORY_TRACT | Status: DC | PRN

## 2017-12-26 MED ORDER — HEPARIN SODIUM (PORCINE) 5000 UNIT/ML IJ SOLN
5000.0000 [IU] | Freq: Three times a day (TID) | INTRAMUSCULAR | Status: DC
  Administered 2017-12-26 – 2017-12-30 (×8): 5000 [IU] via SUBCUTANEOUS
  Filled 2017-12-26 (×8): qty 1

## 2017-12-26 MED ORDER — VANCOMYCIN HCL IN DEXTROSE 1-5 GM/200ML-% IV SOLN
1000.0000 mg | Freq: Once | INTRAVENOUS | Status: AC
  Administered 2017-12-26: 1000 mg via INTRAVENOUS
  Filled 2017-12-26: qty 200

## 2017-12-26 MED ORDER — SODIUM CHLORIDE 0.9 % IV BOLUS (SEPSIS)
1000.0000 mL | Freq: Once | INTRAVENOUS | Status: AC
  Administered 2017-12-26: 1000 mL via INTRAVENOUS

## 2017-12-26 MED ORDER — TAMSULOSIN HCL 0.4 MG PO CAPS
0.4000 mg | ORAL_CAPSULE | Freq: Two times a day (BID) | ORAL | Status: DC
  Administered 2017-12-26 – 2017-12-30 (×8): 0.4 mg via ORAL
  Filled 2017-12-26 (×8): qty 1

## 2017-12-26 MED ORDER — HYDROCODONE-ACETAMINOPHEN 5-325 MG PO TABS
1.0000 | ORAL_TABLET | ORAL | Status: DC | PRN
  Administered 2017-12-27: 14:00:00 1 via ORAL
  Administered 2017-12-27: 19:00:00 2 via ORAL
  Filled 2017-12-26: qty 1
  Filled 2017-12-26: qty 2

## 2017-12-26 MED ORDER — SENNOSIDES-DOCUSATE SODIUM 8.6-50 MG PO TABS: 1.0000 | ORAL_TABLET | Freq: Every evening | ORAL | Status: DC | PRN

## 2017-12-26 MED ORDER — LEVOFLOXACIN IN D5W 750 MG/150ML IV SOLN
INTRAVENOUS | Status: AC
  Filled 2017-12-26: qty 150

## 2017-12-26 MED ORDER — BISACODYL 5 MG PO TBEC: 5.0000 mg | DELAYED_RELEASE_TABLET | Freq: Every day | ORAL | Status: DC | PRN

## 2017-12-26 NOTE — ED Notes (Signed)
Admitting MD at the bedside.  

## 2017-12-26 NOTE — ED Notes (Signed)
Urology is at the bedside

## 2017-12-26 NOTE — ED Provider Notes (Addendum)
[facility] Emergency Department Provider Note  ____________________________________________   I have reviewed the triage vital signs and the nursing notes. Where available I have reviewed prior notes and, if possible and indicated, outside hospital notes.    HISTORY  Chief Complaint Urinary Catheter Problems    HPI Vincent Poole is a 83 y.o. male history of chronic urinary tract infection, on suppressive therapy, history of urinary obstruction, with prostatitis, followed closely by Dr. Erlene Quan, who recently scoped him, found him to have a lot of proteinaceous and other necrotic debris from his prostate.  She therefore start him on antibiotics and has been watching him as an outpatient he is scheduled for a cystoscopy in the next few days.  Patient states that he is having increased pain to his penis and that is what brought him in.  He states he has had somewhat decreased urination as well.  He denies fever or chills at home.  Denies any chest pain or shortness of breath.  States he does have pain to his penis.  No trauma.  Pain is sharp nonradiating, no other associated symptoms except for decreased urinary output.     Past Medical History:  Diagnosis Date  . Anxiety   . Arthritis   . Hypertension   . Prostate cancer ([place])   . Urinary retention 2019   foley catheter place 11/2017  . UTI (urinary tract infection) 2019   frequent UTI's over last year    Patient Active Problem List   Diagnosis Date Noted  . Pyelonephritis 05/31/2017  . UTI (urinary tract infection) 03/04/2017  . Urinary obstruction   . Essential hypertension 08/30/2016  . History of shingles 08/30/2016  . Prostate cancer ([place]) 08/30/2016  . Urinary retention 08/30/2016  . Medicare annual wellness visit, initial 08/01/2016  . Medicare annual wellness visit, subsequent 08/01/2016  . Sepsis ([place]) 07/11/2016  . Borderline diabetes mellitus 01/26/2016  . Vaccine counseling 01/26/2015   . Lumbar radiculitis 06/24/2014  . OA (osteoarthritis) of hip 06/24/2014  . Malignant neoplasm of prostate ([place]) 01/27/2011    Past Surgical History:  Procedure Laterality Date  . CARPAL TUNNEL RELEASE Right   . CHOLECYSTECTOMY  2004  . LEG TENDON SURGERY Right 1958    Prior to Admission medications   Medication Sig Start Date End Date Taking? Authorizing Provider  CRANBERRY PO Take 1 tablet by mouth 2 (two) times daily.   Yes [provider]  finasteride (PROSCAR) 5 MG tablet Take 1 tablet (5 mg total) by mouth daily. Patient taking differently: Take 5 mg by mouth daily.  11/28/16  Yes Ardis Hughs, MD  tamsulosin (FLOMAX) 0.4 MG CAPS capsule Take 1 capsule (0.4 mg total) by mouth 2 (two) times daily. 12/08/17  Yes [name], MD  acetaminophen (TYLENOL) 500 MG tablet Take 1,000 mg by mouth 2 (two) times daily as needed for moderate pain or headache.     [provider]  trimethoprim (TRIMPEX) 100 MG tablet Take 1 tablet (100 mg total) by mouth daily. Patient not taking: Reported on 12/15/2017 07/04/17   [name], MD    Allergies Penicillins  Family History  Problem Relation Age of Onset  . Cancer Mother   . Chronic Renal Failure Mother   . Heart disease Father     Social History Social History   Tobacco Use  . Smoking status: Never Smoker  . Smokeless tobacco: Never Used  Substance Use Topics  . Alcohol use: No    Alcohol/week:  0.0 oz  . Drug use: No    Review of Systems Constitutional: No fever/chills Eyes: No visual changes. ENT: No sore throat. No stiff neck no neck pain Cardiovascular: Denies chest pain. Respiratory: Denies shortness of breath. Gastrointestinal:   no vomiting.  No diarrhea.  No constipation. Genitourinary: Negative for dysuria. Musculoskeletal: Negative lower extremity swelling Skin: Negative for rash. Neurological: Negative for severe headaches, focal weakness or  numbness.   ____________________________________________   PHYSICAL EXAM:  VITAL SIGNS: ED Triage Vitals [12/26/17 1426]  Enc Vitals Group     BP 113/63     Pulse Rate (!) 118     Resp 20     Temp 97.7 F (36.5 C)     Temp Source Oral     SpO2 97 %     Weight 184 lb (83.5 kg)     Height      Head Circumference      Peak Flow      Pain Score 10     Pain Loc      Pain Edu?      Excl. in [place]?     Constitutional: Alert and oriented.  Anxious, chronically ill-appearing eyes: Conjunctivae are normal Head: Atraumatic HEENT: No congestion/rhinnorhea. Mucous membranes are moist.  Oropharynx non-erythematous Neck:   Nontender with no meningismus, no masses, no stridor Cardiovascular: Mild tachycardia noted regular rhythm. Grossly normal heart sounds.  Good peripheral circulation. Respiratory: Normal respiratory effort.  No retractions. Lungs CTAB. Abdominal: Soft and nontender. No distention. No guarding no rebound Back:  There is no focal tenderness or step off.  there is no midline tenderness there are no lesions noted. there is no CVA tenderness Some mild erythema to the scrotum but there is no crepitus it is not indurated not markedly tender, the penis itself does have in Place Foley, there is a purulent or proteinaceous discharge from around that area, penis itself is somewhat tender, no evidence of significant penile lesion noted.  No masses noted. Musculoskeletal: No lower extremity tenderness, no upper extremity tenderness. No joint effusions, no DVT signs strong distal pulses no edema Neurologic:  Normal speech and language. No gross focal neurologic deficits are appreciated.  Skin:  Skin is warm, dry and intact. No rash noted. Psychiatric: Mood and affect are normal. Speech and behavior are normal.  ____________________________________________   LABS (all labs ordered are listed, but only abnormal results are displayed)  Labs Reviewed  URINE CULTURE  CULTURE, BLOOD  (ROUTINE X 2)  CULTURE, BLOOD (ROUTINE X 2)  AEROBIC CULTURE (SUPERFICIAL SPECIMEN)  URINALYSIS, COMPLETE (UACMP) WITH MICROSCOPIC  CBC WITH DIFFERENTIAL/PLATELET  BASIC METABOLIC PANEL  LACTIC ACID, PLASMA  LACTIC ACID, PLASMA  INFLUENZA PANEL BY PCR (TYPE A & B)    Pertinent labs  results that were available during my care of the patient were reviewed by me and considered in my medical decision making (see chart for details). ____________________________________________  EKG  I personally interpreted any EKGs ordered by me or triage Normal sinus rhythm rate 86 bpm no acute ST elevation or depression normal axis unremarkable EKG ____________________________________________  RADIOLOGY  Pertinent labs & imaging results that were available during my care of the patient were reviewed by me and considered in my medical decision making (see chart for details). If possible, patient and/or family made aware of any abnormal findings.  No results found. ____________________________________________    PROCEDURES  Procedure(s) performed: None  Procedures  Critical Care performed: None  ____________________________________________   INITIAL  IMPRESSION / ASSESSMENT AND PLAN / ED COURSE  Pertinent labs & imaging results that were available during my care of the patient were reviewed by me and considered in my medical decision making (see chart for details).  Patient here with decreased urinary output, and feeling of increased pain over his chronic pain in his penile region.  He does have discharge that appears purulent to me, culture has been sent.  I did discuss with Dr. [name] who asked me not to change the catheter.  We did do a rectal temperature here, even though he was initially afebrile I was concerned about the possibility of infection, the patient's core temperature is 102.  We are therefore continuing with a more aggressive sepsis protocol although it is unclear if he  is septic at this time, we are sending lactic acid, giving him Tylenol checking the flu, checking urinalysis urine culture, and I will obtain imaging to ensure that there is no other pathology present such as abscess.  Patient is not actually allergic to penicillin.  He states when he was a child it made him anxious.  We will give him Zosyn which I think is a good medication last culture showed E. coli pansensitive.  We will continue aggressive treatment here.  Given that he is 13 we will give him IV fluids at a insensible rate.  ----------------------------------------- 6:25 PM on 12/26/2017 -----------------------------------------  [place]ust again with Dr. Erlene Quan, she does not wish me to change the Foley she agrees with management and admission, admitting to the hospitalist.  Lactic acid is reassuring I do not think therefore that more aggressive fluid hydration is indicated at this time, we will continue to observe the patient and he is being admitted.    ____________________________________________   FINAL CLINICAL IMPRESSION(S) / ED DIAGNOSES  Final diagnoses:  None      This chart was dictated using voice recognition software.  Despite best efforts to proofread,  errors can occur which can change meaning.      Schuyler Amor, MD 12/26/17 1653    Schuyler Amor, MD 12/26/17 1825    Schuyler Amor, MD 12/26/17 1914    Schuyler Amor, MD 01/08/18 [id number]

## 2017-12-26 NOTE — Progress Notes (Signed)
ANTIBIOTIC CONSULT NOTE - INITIAL  Pharmacy Consult for aztreonam and levofloxacin Indication: UTI  Allergies  Allergen Reactions  . Penicillins Other (See Comments)    Caused nervousness as a child. Patient states he took as a teenager w/o problems Has patient had a PCN reaction causing immediate rash, facial/tongue/throat swelling, SOB or lightheadedness with hypotension: No Has patient had a PCN reaction causing severe rash involving mucus membranes or skin necrosis: No Has patient had a PCN reaction that required hospitalization: No Has patient had a PCN reaction occurring within the last 10 years: No If all of the above answers are "NO", then may proceed with    Patient Measurements: Weight: 184 lb (83.5 kg) Adjusted Body Weight:   Vital Signs: Temp: 99.6 F (37.6 C) (02/19 1814) Temp Source: Oral (02/19 1814) BP: 111/92 (02/19 1814) Pulse Rate: 96 (02/19 1814) Intake/Output from previous day: No intake/output data recorded. Intake/Output from this shift: Total I/O In: 2050 [IV Piggyback:2050] Out: -   Labs: Recent Labs    12/26/17 1608  WBC 17.4*  HGB 12.4*  PLT 399  CREATININE 1.31*   Estimated Creatinine Clearance: 47.9 mL/min (A) (by C-G formula based on SCr of 1.31 mg/dL (H)). No results for input(s): VANCOTROUGH, VANCOPEAK, VANCORANDOM, GENTTROUGH, GENTPEAK, GENTRANDOM, TOBRATROUGH, TOBRAPEAK, TOBRARND, AMIKACINPEAK, AMIKACINTROU, AMIKACIN in the last 72 hours.   Microbiology: Recent Results (from the past 720 hour(s))  Microscopic Examination     Status: Abnormal   Collection Time: 11/28/17 11:18 AM  Result Value Ref Range Status   WBC, UA >30 (H) 0 - 5 /hpf Final   RBC, UA >30 (H) 0 - 2 /hpf Final   Epithelial Cells (non renal) None seen 0 - 10 /hpf Final   Mucus, UA Present (A) Not Estab. Final   Bacteria, UA Many (A) None seen/Few Final  CULTURE, URINE COMPREHENSIVE     Status: None   Collection Time: 11/28/17 11:37 AM  Result Value Ref Range  Status   Urine Culture, Comprehensive Final report  Final   Organism ID, Bacteria Comment  Final    Comment: Mixed urogenital flora 10,000-25,000 colony forming units per mL   Urine culture     Status: None   Collection Time: 12/11/17 10:18 PM  Result Value Ref Range Status   Specimen Description   Final    URINE, RANDOM Performed at [facility], [address], [place], [place] [address]    Special Requests   Final    NONE Performed at [facility], [address] [place], [place] [address]    Culture   Final    NO GROWTH Performed at [facility], [place] [address] [place], [place] [address]    Report Status 12/13/2017 FINAL  Final  CULTURE, URINE COMPREHENSIVE     Status: None   Collection Time: 12/14/17 11:39 AM  Result Value Ref Range Status   Urine Culture, Comprehensive Final report  Final   Organism ID, Bacteria Comment  Final    Comment: Mixed urogenital flora 10,000-25,000 colony forming units per mL     Medical History: Past Medical History:  Diagnosis Date  . Anxiety   . Arthritis   . Hypertension   . Prostate cancer ([place])   . Urinary retention 2019   foley catheter place 11/2017  . UTI (urinary tract infection) 2019   frequent UTI's over last year    Medications:  Infusions:  . aztreonam    . levofloxacin (LEVAQUIN) IV    .  vancomycin 1,000 mg (12/26/17 1829)   Assessment: 82 yom cc urinary catheter problems on suppressive therapy, hx urinary obstruction and prostatitis followed OP by Dr. Erlene Quan. Recent uroscopy with proteinaceous and other necrotic debris. Increasing pain over several days prompted admission. Pharmacy consulted to dose aztreonam (PCN allergy) and levofloxacin for UTI.  Goal of Therapy:  Resolve infection Prevent ADE  Plan:  1. Aztreonam 1 gm IV Q8H 2. Levofloxacin 750 mg IV Q48H  Laural Benes, Pharm.D., BCPS Clinical Pharmacist 12/26/2017,6:47 PM

## 2017-12-26 NOTE — Progress Notes (Signed)
Radiation Oncology Follow up Note  Name: Vincent Poole   Date:   12/26/2017 MRN:  [id number] DOB: 1935-03-10    This 83 y.o. male presents to the clinic today for 8.5 year for follow-up for adenocarcinoma the prostate.  REFERRING PROVIDER: [name], MD  HPI: Patient is an 83 year old male now out 8.5 years having completed IM RT radiation therapy for Gleason 7 adenocarcinoma the prostate presenting the PSA of 29. He is seen today in routine follow-up. His PSAs and was undetectable at 0.03. Patient is under the care of urology now for multiple recurrent urinary tract infections. He had a CT scan recently showing marked bladder wall thickening with perivascular edema with a 4 cm soft tissue attenuating filling defect in the bladder. He scheduled for cystoscopy and biopsy tomorrow. .  COMPLICATIONS OF TREATMENT: none  FOLLOW UP COMPLIANCE: keeps appointments   PHYSICAL EXAM:  BP 138/83   Pulse (!) 110   Temp (!) 96.2 F (35.7 C)   Resp 20   Wt 184 lb 1.4 oz (83.5 kg)   BMI 25.67 kg/m  Well-developed well-nourished patient in NAD. HEENT reveals PERLA, EOMI, discs not visualized.  Oral cavity is clear. No oral mucosal lesions are identified. Neck is clear without evidence of cervical or supraclavicular adenopathy. Lungs are clear to A&P. Cardiac examination is essentially unremarkable with regular rate and rhythm without murmur rub or thrill. Abdomen is benign with no organomegaly or masses noted. Motor sensory and DTR levels are equal and symmetric in the upper and lower extremities. Cranial nerves II through XII are grossly intact. Proprioception is intact. No peripheral adenopathy or edema is identified. No motor or sensory levels are noted. Crude visual fields are within normal range.  RADIOLOGY RESULTS: CT scan reviewed and compatible with the above-stated findings  PLAN: At this time patient will be worked up for possible bladder cancer area his prostate cancer is under  excellent biochemical control over 8 years out. I like to discontinue follow-up care for prostate cancer. I will turn follow-up care over to urology. We'll be happy to reevaluate the patient should this be a bladder cancer or he isn't or if he has any need of further radiation therapy.   Noreene Filbert, MD

## 2017-12-26 NOTE — Telephone Encounter (Signed)
Pt wife left a message on triage line stating pt was having problems with foley. Returned wife call without success. Left a message on cell phone number.

## 2017-12-26 NOTE — ED Notes (Signed)
Pt is having some urine production, appears dark amber, ?blood tinged.  Pt has had 1L NS bolus and produced 200cc urine by foley in this amount of time.

## 2017-12-26 NOTE — ED Notes (Signed)
Follow up lactic acid not collected as original one was WNL

## 2017-12-26 NOTE — ED Notes (Signed)
Per EDP order I irrigated the foley.  I did so using sterile technique using 50cc Normal saline, I slowly instilled this.  No resistance was felt and pt tolerated this well.  No urine return in foley after this.  I flushed in another 50cc NS which pt tolerated well.  Again, no urine returned.  I flushed in another 50cc of Normal Saline.  Pt tolerated this well.  No urine returned after instilling 150cc NS.   I decided to see if I could pull back and get urine.  I was able to do so without any difficulties.  I was able to pull back 50cc x3 of dark amber, slightly blood tinged urine for a total of 150cc urine.  After this bladder scan was done and 57cc urine was found by bladder scanner to be in bladder.  EDP was notified

## 2017-12-26 NOTE — H&P (Addendum)
[facility] [place] NAME: Vincent Poole    MR#:  [id number]  DATE OF BIRTH:  1935-05-21  DATE OF ADMISSION:  12/26/2017  PRIMARY CARE PHYSICIAN: [name], MD   REQUESTING/REFERRING PHYSICIAN: Schuyler Amor, MD  CHIEF COMPLAINT:   Chief Complaint  Patient presents with  . Urinary Catheter Problems   Urinary catheter problem HISTORY OF PRESENT ILLNESS:  Vincent Poole  is a 83 y.o. male with a known history of multiple medical problems as below.  The patient has chronic Foley catheter and recurrent UTI.  He came to the ED due to above chief complaints.  He complains of worsening pain from his penis.  He was found septic and started IV antibiotics.  CT of the abdomen shows New moderate bilateral hydronephrosis with hematoma or urothelial lesion. Dr. Burlene Arnt discussed with Dr. Erlene Quan, who will see the patient tomorrow morning.  PAST MEDICAL HISTORY:   Past Medical History:  Diagnosis Date  . Anxiety   . Arthritis   . Hypertension   . Prostate cancer ([place])   . Urinary retention 2019   foley catheter place 11/2017  . UTI (urinary tract infection) 2019   frequent UTI's over last year    PAST SURGICAL HISTORY:   Past Surgical History:  Procedure Laterality Date  . CARPAL TUNNEL RELEASE Right   . CHOLECYSTECTOMY  2004  . LEG TENDON SURGERY Right 1958    SOCIAL HISTORY:   Social History   Tobacco Use  . Smoking status: Never Smoker  . Smokeless tobacco: Never Used  Substance Use Topics  . Alcohol use: No    Alcohol/week: 0.0 oz    FAMILY HISTORY:   Family History  Problem Relation Age of Onset  . Cancer Mother   . Chronic Renal Failure Mother   . Heart disease Father     DRUG ALLERGIES:   Allergies  Allergen Reactions  . Penicillins Other (See Comments)    Caused nervousness as a child. Patient states he took as a teenager w/o problems Has patient had a PCN reaction causing immediate rash,  facial/tongue/throat swelling, SOB or lightheadedness with hypotension: No Has patient had a PCN reaction causing severe rash involving mucus membranes or skin necrosis: No Has patient had a PCN reaction that required hospitalization: No Has patient had a PCN reaction occurring within the last 10 years: No If all of the above answers are "NO", then may proceed with    REVIEW OF SYSTEMS:   Review of Systems  Constitutional: Negative for chills, fever and malaise/fatigue.  HENT: Negative for sore throat.   Eyes: Negative for blurred vision and double vision.  Respiratory: Negative for cough, hemoptysis, shortness of breath, wheezing and stridor.   Cardiovascular: Negative for chest pain, palpitations, orthopnea and leg swelling.  Gastrointestinal: Negative for abdominal pain, blood in stool, diarrhea, melena, nausea and vomiting.  Genitourinary: Positive for hematuria. Negative for dysuria and flank pain.       Penis pain.  Musculoskeletal: Negative for back pain and joint pain.  Skin: Negative for rash.  Neurological: Negative for dizziness, sensory change, focal weakness, seizures, loss of consciousness, weakness and headaches.  Endo/Heme/Allergies: Negative for polydipsia.  Psychiatric/Behavioral: Negative for depression. The patient is not nervous/anxious.     MEDICATIONS AT HOME:   Prior to Admission medications   Medication Sig Start Date End Date Taking? Authorizing Provider  CRANBERRY PO Take 1 tablet by mouth 2 (two) times daily.   Yes  [provider]  finasteride (PROSCAR) 5 MG tablet Take 1 tablet (5 mg total) by mouth daily. Patient taking differently: Take 5 mg by mouth daily.  11/28/16  Yes Ardis Hughs, MD  tamsulosin (FLOMAX) 0.4 MG CAPS capsule Take 1 capsule (0.4 mg total) by mouth 2 (two) times daily. 12/08/17  Yes [name], MD  acetaminophen (TYLENOL) 500 MG tablet Take 1,000 mg by mouth 2 (two) times daily as needed for moderate pain or  headache.     [provider]  trimethoprim (TRIMPEX) 100 MG tablet Take 1 tablet (100 mg total) by mouth daily. Patient not taking: Reported on 12/15/2017 07/04/17   [name], MD      VITAL SIGNS:  Blood pressure (!) 111/92, pulse 96, temperature 99.6 F (37.6 C), temperature source Oral, resp. rate (!) 25, weight 184 lb (83.5 kg), SpO2 94 %.  PHYSICAL EXAMINATION:  Physical Exam  GENERAL:  83 y.o.-year-old patient lying in the bed with no acute distress.  EYES: Pupils equal, round, reactive to light and accommodation. No scleral icterus. Extraocular muscles intact.  HEENT: Head atraumatic, normocephalic. Oropharynx and nasopharynx clear.  NECK:  Supple, no jugular venous distention. No thyroid enlargement, no tenderness.  LUNGS: Normal breath sounds bilaterally, no wheezing, rales,rhonchi or crepitation. No use of accessory muscles of respiration.  CARDIOVASCULAR: S1, S2 normal. No murmurs, rubs, or gallops.  ABDOMEN: Soft, nontender, nondistended. Bowel sounds present. No organomegaly or mass.  Foley catheter in situ with hematuria. EXTREMITIES: No pedal edema, cyanosis, or clubbing.  NEUROLOGIC: Cranial nerves II through XII are intact. Muscle strength 5/5 in all extremities. Sensation intact. Gait not checked.  PSYCHIATRIC: The patient is alert and oriented x 3.  SKIN: No obvious rash, lesion, or ulcer.   LABORATORY PANEL:   CBC Recent Labs  Lab 12/26/17 1608  WBC 17.4*  HGB 12.4*  HCT 37.1*  PLT 399   ------------------------------------------------------------------------------------------------------------------  Chemistries  Recent Labs  Lab 12/26/17 1608  NA 136  K 3.7  CL 103  CO2 22  GLUCOSE 157*  BUN 20  CREATININE 1.31*  CALCIUM 9.0   ------------------------------------------------------------------------------------------------------------------  Cardiac Enzymes No results for input(s): TROPONINI in the last 168  hours. ------------------------------------------------------------------------------------------------------------------  RADIOLOGY:  Ct Renal Stone Study  Result Date: 12/26/2017 CLINICAL DATA:  Penile pain and decreased urinary output. History of chronic UTI. EXAM: CT ABDOMEN AND PELVIS WITHOUT CONTRAST TECHNIQUE: Multidetector CT imaging of the abdomen and pelvis was performed following the standard protocol without IV contrast. COMPARISON:  CT abdomen pelvis dated December 20, 2017. FINDINGS: Lower chest: No acute abnormality. Bilateral gynecomastia. Three-vessel coronary artery atherosclerosis. Hepatobiliary: No focal liver abnormality is seen. Status post cholecystectomy. No biliary dilatation. Pancreas: Unremarkable. No pancreatic ductal dilatation or surrounding inflammatory changes. Spleen: Normal in size without focal abnormality. Adrenals/Urinary Tract: The adrenal glands are unremarkable. New moderate bilateral hydronephrosis. No renal or ureteral calculi. Foley catheter within the decompressed bladder. Persistent perivesicular inflammatory changes and diffuse bladder wall thickening, with unchanged intermediate density filling defect within the bladder. Stomach/Bowel: Small hiatal hernia. The stomach is otherwise within normal limits. The appendix is normal. No bowel wall thickening, distention, or surrounding inflammatory changes. Vascular/Lymphatic: Mild aortic atherosclerosis. No enlarged abdominal or pelvic lymph nodes. Reproductive: The prostate gland remains enlarged. Other: No free fluid or pneumoperitoneum. Musculoskeletal: No acute or significant osseous findings. Severe degenerative changes of the bilateral hips. Severe osteopenia. IMPRESSION: 1. New moderate bilateral hydronephrosis. Unchanged diffuse bladder wall thickening and perivesicular inflammatory changes with intermediate  soft tissue density filling defect within the bladder concerning for hematoma or urothelial lesion.  Correlation with cystoscopy is again advised. 2.  Aortic atherosclerosis (ICD10-I70.0). Electronically Signed   By: Titus Dubin M.D.   On: 12/26/2017 17:55      IMPRESSION AND PLAN:   Sepsis due to recurrent UTI. The patient will be admitted to medical floor.  Continue sepsis protocol, Levaquin and attacked him pharmacy to dose, follow-up CBC and cultures.  Acute renal failure, possible due to dehydration and hydronephrosis. IV fluids support and follow-up BMP.  New moderate bilateral hydronephrosis with hematoma or urothelial lesion Continue Flomax and Proscar. N.p.o. after midnight for cystoscopy and possible stent placement per Dr. Rayburn Ma.  I discussed with Dr. Erlene Quan. All the records are reviewed and case discussed with ED provider. Management plans discussed with the patient, his wife and they are in agreement.  CODE STATUS:   TOTAL TIME TAKING CARE OF THIS PATIENT: 56 minutes.    Demetrios Loll M.D on 12/26/2017 at 6:41 PM  Between 7am to 6pm - Pager - [phone]  After 6pm go to [email or web address] - Proofreader  [facility]  Office  [phone]  CC: Primary care physician; [name], MD   Note: This dictation was prepared with Dragon dictation along with smaller phrase technology. Any transcriptional errors that result from this process are unin

## 2017-12-26 NOTE — ED Notes (Signed)
Call to 1C to give report 

## 2017-12-26 NOTE — Consult Note (Signed)
Urology Consult  I have been asked to see the patient by Dr. Burlene Poole, for evaluation and management of Foley catheter isse.  Chief Complaint: Penile pain, nondraining catheter, worsening hydronephrosis, fever  History of Present Illness: Vincent Poole is a 83 y.o. year old with recurrent urinary tract infections, urinary retention scheduled to go to the operating room tomorrow as an outpatient for cystoscopy, possible bladder/prostate biopsy, bilateral retrograde pyelogram who presents prematurely today to the emergency room with multiple complaints.  He is well-known to me, see previous notes for details.  He was seen at the cancer center earlier today (Dr. Baruch Poole).  Since this morning, he has noted decreased output of his urinary catheter which was not draining well.  In addition, he complains of being easily fatigued as well as chills.  He has not taken his temp today.  He denies any abdominal pain, flank pain, nausea, vomiting, or significant changes from his baseline health status.  He overall feels fairly good after his catheter was manipulated and is draining now.  Upon further evaluation in the emergency room, it was felt that he had a possible UTI based on the appearance of his urine and discharge around the catheter which has been his baseline.  In addition to this, however, he does have a worsening leukocytosis, mild tachycardia (unchanged from his baseline), fever to 102, as well as worsening hydronephrosis, now moderate bilaterally on repeat CT scan.  No elevated lactate.     Past Medical History:  Diagnosis Date  . Anxiety   . Arthritis   . Hypertension   . Prostate cancer ([place])   . Urinary retention 2019   foley catheter place 11/2017  . UTI (urinary tract infection) 2019   frequent UTI's over last year    Past Surgical History:  Procedure Laterality Date  . CARPAL TUNNEL RELEASE Right   . CHOLECYSTECTOMY  2004  . LEG TENDON SURGERY Right 1958    Home  Medications:  Current Meds  Medication Sig  . CRANBERRY PO Take 1 tablet by mouth 2 (two) times daily.  . finasteride (PROSCAR) 5 MG tablet Take 1 tablet (5 mg total) by mouth daily. (Patient taking differently: Take 5 mg by mouth daily. )  . tamsulosin (FLOMAX) 0.4 MG CAPS capsule Take 1 capsule (0.4 mg total) by mouth 2 (two) times daily.    Allergies:  Allergies  Allergen Reactions  . Penicillins Other (See Comments)    Caused nervousness as a child. Patient states he took as a teenager w/o problems Has patient had a PCN reaction causing immediate rash, facial/tongue/throat swelling, SOB or lightheadedness with hypotension: No Has patient had a PCN reaction causing severe rash involving mucus membranes or skin necrosis: No Has patient had a PCN reaction that required hospitalization: No Has patient had a PCN reaction occurring within the last 10 years: No If all of the above answers are "NO", then may proceed with    Family History  Problem Relation Age of Onset  . Cancer Mother   . Chronic Renal Failure Mother   . Heart disease Father     Social History:  reports that  has never smoked. he has never used smokeless tobacco. He reports that he does not drink alcohol or use drugs.  ROS: A complete review of systems was performed.  All systems are negative except for pertinent findings as noted.  Physical Exam:  Vital signs in last 24 hours: Temp:  [96.2 F (35.7 C)-102.4 F (  39.1 C)] 99.6 F (37.6 C) (02/19 1814) Pulse Rate:  [96-118] 96 (02/19 1814) Resp:  [16-25] 25 (02/19 1814) BP: (111-138)/(63-92) 111/92 (02/19 1814) SpO2:  [94 %-97 %] 94 % (02/19 1814) Weight:  [184 lb (83.5 kg)-184 lb 1.4 oz (83.5 kg)] 184 lb (83.5 kg) (02/19 1426) Constitutional:  Alert and oriented, No acute distress.  Wife at bedside.  Extremely malodorous. HEENT: [place] AT, moist mucus membranes.  Trachea midline, no masses Cardiovascular: Mildly tachycardic. Respiratory: Normal respiratory  effort, no increased work of breathing. GI: Abdomen is soft, nontender, nondistended, no abdominal masses GU: Foley catheter in place with mild stable peri-catheter discharge, unchanged from baseline.  His urine remains purulent, with slight red tinge and a large amount of debris, again unchanged from baseline. Skin: No rashes, bruises or suspicious lesions Neurologic: Grossly intact, no focal deficits, moving all 4 extremities Psychiatric: Normal mood and affect   Laboratory Data:  Recent Labs    12/26/17 1608  WBC 17.4*  HGB 12.4*  HCT 37.1*   Recent Labs    12/26/17 1608  NA 136  K 3.7  CL 103  CO2 22  GLUCOSE 157*  BUN 20  CREATININE 1.31*  CALCIUM 9.0   No results for input(s): LABPT, INR in the last 72 hours. No results for input(s): LABURIN in the last 72 hours. Results for orders placed or performed in visit on 12/14/17  CULTURE, URINE COMPREHENSIVE     Status: None   Collection Time: 12/14/17 11:39 AM  Result Value Ref Range Status   Urine Culture, Comprehensive Final report  Final   Organism ID, Bacteria Comment  Final    Comment: Mixed urogenital flora 10,000-25,000 colony forming units per mL      Radiologic Imaging: Ct Renal Stone Study  Result Date: 12/26/2017 CLINICAL DATA:  Penile pain and decreased urinary output. History of chronic UTI. EXAM: CT ABDOMEN AND PELVIS WITHOUT CONTRAST TECHNIQUE: Multidetector CT imaging of the abdomen and pelvis was performed following the standard protocol without IV contrast. COMPARISON:  CT abdomen pelvis dated December 20, 2017. FINDINGS: Lower chest: No acute abnormality. Bilateral gynecomastia. Three-vessel coronary artery atherosclerosis. Hepatobiliary: No focal liver abnormality is seen. Status post cholecystectomy. No biliary dilatation. Pancreas: Unremarkable. No pancreatic ductal dilatation or surrounding inflammatory changes. Spleen: Normal in size without focal abnormality. Adrenals/Urinary Tract: The adrenal  glands are unremarkable. New moderate bilateral hydronephrosis. No renal or ureteral calculi. Foley catheter within the decompressed bladder. Persistent perivesicular inflammatory changes and diffuse bladder wall thickening, with unchanged intermediate density filling defect within the bladder. Stomach/Bowel: Small hiatal hernia. The stomach is otherwise within normal limits. The appendix is normal. No bowel wall thickening, distention, or surrounding inflammatory changes. Vascular/Lymphatic: Mild aortic atherosclerosis. No enlarged abdominal or pelvic lymph nodes. Reproductive: The prostate gland remains enlarged. Other: No free fluid or pneumoperitoneum. Musculoskeletal: No acute or significant osseous findings. Severe degenerative changes of the bilateral hips. Severe osteopenia. IMPRESSION: 1. New moderate bilateral hydronephrosis. Unchanged diffuse bladder wall thickening and perivesicular inflammatory changes with intermediate soft tissue density filling defect within the bladder concerning for hematoma or urothelial lesion. Correlation with cystoscopy is again advised. 2.  Aortic atherosclerosis (ICD10-I70.0). Electronically Signed   By: Titus Dubin M.D.   On: 12/26/2017 17:55   CT scan was reviewed which does indeed show progress with moderate bilateral hydroureteronephrosis down to the level of the bladder.  Impression/Plan:  1. Sepsis-technically meets sepsis criteria with fever, tachycardia, possible source.  That being said, he appears to  be clinically well, is hemodynamically stable with normal blood pressure and no lactate level which is reassuring.  His urine remains cloudy and debris-filled which is unchanged from his baseline and all previous urine cultures in the recent past have been negative.  I am highly suspicious that this will be the case again today and that his source may or may not be from the bladder.  Influenza negative.  Currently on broad-spectrum vancomycin with urine and  blood cultures pending.  2. Possible bladder lesion/ prostate necrosis-highly suspicious for possible underlying malignant process masquerading as infection.  Quality of office cystoscopy was suboptimal due to amount of debris.  Scheduled for repeat cystoscopy, possible biopsy, bilateral retrogrades in the operating room tomorrow for further evaluation of this.  We will reassess his clinical status in the morning and if he remains stable, may proceed to the operating room despite his current presentation if he improving for further diagnostic evaluation.  3.  New moderate bilateral hydronephrosis-noncontrast CT scan performed just after the Foley catheter malfunction was resolved.  Unclear if this was related to previous bladder distention which is now resolved with irrigation of Foley vs. obstruction with from mass/lesion versus inflammatory and functional hydronephrosis.   Based on his overall well-being tonight, I do not think that he needs emergent intervention.  Plan to reassess first thing in the morning and decide whether or not to proceed to the operating room.  May consider placement of bilateral ureteral stents at that time if hydronephrosis persists.  Case was discussed both with Dr. Burlene Poole as well as Dr. Bridgett Larsson.  He will remain n.p.o. at midnight with reassessment first thing in the morning about whether or not to proceed to the operating room tomorrow as previously scheduled.   If patient has any changes in his clinical status including development of hypotension, need for pressors, or any other evidence of multiorgan failure, would recommend emergent intervention.  Please page urology ASAP if his clinical status changes overnight.   12/26/2017, 6:56 PM  [name],  MD

## 2017-12-26 NOTE — ED Notes (Signed)
Pt was taken to CT

## 2017-12-26 NOTE — ED Notes (Signed)
ED TO INPATIENT HANDOFF REPORT  Name/Age/Gender Vincent Poole 83 y.o. male  Code Status Code Status History    Date Active Date Inactive Code Status Order ID Comments User Context   05/31/2017 02:08 06/01/2017 19:22 Full Code [id number]  Harvie Bridge, DO Inpatient   03/04/2017 18:33 03/05/2017 14:26 Full Code [id number]  Idelle Crouch, MD Inpatient   07/11/2016 05:38 07/13/2016 14:20 Full Code [id number]  Saundra Shelling, MD ED      Home/SNF/Other Home  Chief Complaint abd pain  Level of Care/Admitting Diagnosis ED Disposition    ED Disposition Condition [place]: [place] [[id number]]  Level of Care: Med-Surg [16]  Diagnosis: Sepsis [facility]) [[id number]]  Admitting Physician: Demetrios Loll [[id number]]  Attending Physician: Demetrios Loll [phone]  Estimated length of stay: past midnight tomorrow  Certification:: I certify this patient will need inpatient services for at least 2 midnights  PT Class (Do Not Modify): Inpatient [101]  PT Acc Code (Do Not Modify): Private [1]       Medical History Past Medical History:  Diagnosis Date  . Anxiety   . Arthritis   . Hypertension   . Prostate cancer ([place])   . Urinary retention 2019   foley catheter place 11/2017  . UTI (urinary tract infection) 2019   frequent UTI's over last year    Allergies Allergies  Allergen Reactions  . Penicillins Other (See Comments)    Caused nervousness as a child. Patient states he took as a teenager w/o problems Has patient had a PCN reaction causing immediate rash, facial/tongue/throat swelling, SOB or lightheadedness with hypotension: No Has patient had a PCN reaction causing severe rash involving mucus membranes or skin necrosis: No Has patient had a PCN reaction that required hospitalization: No Has patient had a PCN reaction occurring within the last 10 years: No If all of the above answers are "NO", then may proceed with    IV  Location/Drains/Wounds Patient Lines/Drains/Airways Status   Active Line/Drains/Airways    Name:   Placement date:   Placement time:   Site:   Days:   Peripheral IV 12/26/17 Right Antecubital   12/26/17    1637    Antecubital   less than 1   Peripheral IV 12/26/17 Left;Posterior Wrist   12/26/17    1705    Wrist   less than 1   Urethral Catheter Myrtha Mantis RN Latex 16 Fr.   12/11/17    2231    Latex   15          Labs/Imaging Results for orders placed or performed during the hospital encounter of 12/26/17 (from the past 48 hour(s))  Urinalysis, Complete w Microscopic     Status: Abnormal   Collection Time: 12/26/17  4:07 PM  Result Value Ref Range   Color, Urine AMBER (A) YELLOW    Comment: BIOCHEMICALS MAY BE AFFECTED BY COLOR   APPearance TURBID (A) CLEAR   Specific Gravity, Urine 1.015 1.005 - 1.030   pH 8.0 5.0 - 8.0   Glucose, UA NEGATIVE NEGATIVE mg/dL   Hgb urine dipstick NEGATIVE NEGATIVE   Bilirubin Urine MODERATE (A) NEGATIVE   Ketones, ur NEGATIVE NEGATIVE mg/dL   Protein, ur >=300 (A) NEGATIVE mg/dL   Nitrite NEGATIVE NEGATIVE   Leukocytes, UA SMALL (A) NEGATIVE   RBC / HPF TOO NUMEROUS TO COUNT 0 - 5 RBC/hpf   WBC, UA TOO NUMEROUS TO COUNT 0 - 5 WBC/hpf   Bacteria, UA  NONE SEEN NONE SEEN   Squamous Epithelial / LPF NONE SEEN NONE SEEN   Mucus PRESENT     Comment: Performed at [facility], [address] [place], [place] [address]  CBC with Differential     Status: Abnormal   Collection Time: 12/26/17  4:08 PM  Result Value Ref Range   WBC 17.4 (H) 3.8 - 10.6 K/uL   RBC 4.24 (L) 4.40 - 5.90 MIL/uL   Hemoglobin 12.4 (L) 13.0 - 18.0 g/dL   HCT 37.1 (L) 40.0 - 52.0 %   MCV 87.4 80.0 - 100.0 fL   MCH 29.2 26.0 - 34.0 pg   MCHC 33.4 32.0 - 36.0 g/dL   RDW 15.4 (H) 11.5 - 14.5 %   Platelets 399 150 - 440 K/uL   Neutrophils Relative % 91 %   Neutro Abs 15.8 (H) 1.4 - 6.5 K/uL   Lymphocytes Relative 2 %   Lymphs Abs 0.4 (L) 1.0 - 3.6 K/uL    Monocytes Relative 7 %   Monocytes Absolute 1.2 (H) 0.2 - 1.0 K/uL   Eosinophils Relative 0 %   Eosinophils Absolute 0.0 0 - 0.7 K/uL   Basophils Relative 0 %   Basophils Absolute 0.0 0 - 0.1 K/uL    Comment: Performed at [facility], [address] [place], [place] [address]  Basic metabolic panel     Status: Abnormal   Collection Time: 12/26/17  4:08 PM  Result Value Ref Range   Sodium 136 135 - 145 mmol/L   Potassium 3.7 3.5 - 5.1 mmol/L   Chloride 103 101 - 111 mmol/L   CO2 22 22 - 32 mmol/L   Glucose, Bld 157 (H) 65 - 99 mg/dL   BUN 20 6 - 20 mg/dL   Creatinine, Ser 1.31 (H) 0.61 - 1.24 mg/dL   Calcium 9.0 8.9 - 10.3 mg/dL   GFR calc non Af Amer 50 (L) >60 mL/min   GFR calc Af Amer 58 (L) >60 mL/min    Comment: (NOTE) The eGFR has been calculated using the CKD EPI equation. This calculation has not been validated in all clinical situations. eGFR's persistently <60 mL/min signify possible Chronic Kidney Disease.    Anion gap 11 5 - 15    Comment: Performed at [facility], [place], [place] [address]  Lactic acid, plasma     Status: None   Collection Time: 12/26/17  4:49 PM  Result Value Ref Range   Lactic Acid, Venous 1.8 0.5 - 1.9 mmol/L    Comment: Performed at [facility], [address] [place], [place] [address]  Influenza panel by PCR (type A & B)     Status: None   Collection Time: 12/26/17  4:49 PM  Result Value Ref Range   Influenza A By PCR NEGATIVE NEGATIVE   Influenza B By PCR NEGATIVE NEGATIVE    Comment: (NOTE) The Xpert Xpress Flu assay is intended as an aid in the diagnosis of  influenza and should not be used as a sole basis for treatment.  This  assay is FDA approved for nasopharyngeal swab specimens only. Nasal  washings and aspirates are unacceptable for Xpert Xpress Flu testing. Performed at [facility], [address] [place],  [address]    Ct Renal Joaquim Lai Study  Result Date:  12/26/2017 CLINICAL DATA:  Penile pain and decreased urinary output. History of chronic UTI. EXAM: CT ABDOMEN AND PELVIS WITHOUT CONTRAST TECHNIQUE: Multidetector CT imaging of  the abdomen and pelvis was performed following the standard protocol without IV contrast. COMPARISON:  CT abdomen pelvis dated December 20, 2017. FINDINGS: Lower chest: No acute abnormality. Bilateral gynecomastia. Three-vessel coronary artery atherosclerosis. Hepatobiliary: No focal liver abnormality is seen. Status post cholecystectomy. No biliary dilatation. Pancreas: Unremarkable. No pancreatic ductal dilatation or surrounding inflammatory changes. Spleen: Normal in size without focal abnormality. Adrenals/Urinary Tract: The adrenal glands are unremarkable. New moderate bilateral hydronephrosis. No renal or ureteral calculi. Foley catheter within the decompressed bladder. Persistent perivesicular inflammatory changes and diffuse bladder wall thickening, with unchanged intermediate density filling defect within the bladder. Stomach/Bowel: Small hiatal hernia. The stomach is otherwise within normal limits. The appendix is normal. No bowel wall thickening, distention, or surrounding inflammatory changes. Vascular/Lymphatic: Mild aortic atherosclerosis. No enlarged abdominal or pelvic lymph nodes. Reproductive: The prostate gland remains enlarged. Other: No free fluid or pneumoperitoneum. Musculoskeletal: No acute or significant osseous findings. Severe degenerative changes of the bilateral hips. Severe osteopenia. IMPRESSION: 1. New moderate bilateral hydronephrosis. Unchanged diffuse bladder wall thickening and perivesicular inflammatory changes with intermediate soft tissue density filling defect within the bladder concerning for hematoma or urothelial lesion. Correlation with cystoscopy is again advised. 2.  Aortic atherosclerosis (ICD10-I70.0). Electronically Signed   By: Titus Dubin M.D.   On: 12/26/2017 17:55    Pending  Labs Unresulted Labs (From admission, onward)   Start     Ordered   12/26/17 1643  Lactic acid, plasma  STAT Now then every 3 hours,   STAT     12/26/17 1644   12/26/17 1552  Culture, blood (routine x 2)  BLOOD CULTURE X 2,   STAT     12/26/17 1551   12/26/17 1552  Wound or Superficial Culture  Once,   STAT     12/26/17 1551   12/26/17 1544  Urine culture  Once,   STAT     12/26/17 1551   Signed and Held  Creatinine, serum  (heparin)  Once,   R    Comments:  Baseline for heparin therapy IF NOT ALREADY DRAWN.    Signed and Held   Signed and Held  Basic metabolic panel  Tomorrow morning,   R     Signed and Held   Signed and Held  CBC  Tomorrow morning,   R     Signed and Held   Signed and Held  Procalcitonin  STAT,   R     Signed and Held   Signed and Held  Protime-INR  STAT,   R     Signed and Held   Signed and Held  APTT  STAT,   R     Signed and Held      Vitals/Pain Today's Vitals   12/26/17 1719 12/26/17 1813 12/26/17 1814 12/26/17 1858  BP: 134/71  (!) 111/92 (!) 112/54  Pulse: (!) 103  96 89  Resp: 16  (!) 25 15  Temp:   99.6 F (37.6 C) 99.1 F (37.3 C)  TempSrc:   Oral Oral  SpO2: 95%  94% 95%  Weight:      PainSc: 0-No pain 0-No pain  0-No pain    Isolation Precautions No active isolations  Medications Medications  aztreonam (AZACTAM) 1 g in sodium chloride 0.9 % 100 mL IVPB (not administered)  levofloxacin (LEVAQUIN) IVPB 750 mg (not administered)  sodium chloride 0.9 % bolus 1,000 mL (0 mLs Intravenous Stopped 12/26/17 1719)  acetaminophen (TYLENOL) tablet 650 mg (650 mg Oral Given 12/26/17 1654)  piperacillin-tazobactam (ZOSYN) IVPB 3.375 g (0 g Intravenous Stopped 12/26/17 1741)  sodium chloride 0.9 % bolus 1,000 mL (0 mLs Intravenous Stopped 12/26/17 1834)  vancomycin (VANCOCIN) IVPB 1000 mg/200 mL premix (0 mg Intravenous Stopped 12/26/17 2051)    Mobility walks

## 2017-12-26 NOTE — ED Triage Notes (Signed)
Pt to ed via pov with reports of foley catheter not working properly. Has not drained any urine since 6am. Pt c/o penile pain and pelvic pain.

## 2017-12-27 ENCOUNTER — Inpatient Hospital Stay: Payer: Medicare Other | Admitting: Registered Nurse

## 2017-12-27 ENCOUNTER — Ambulatory Visit: Admission: RE | Admit: 2017-12-27 | Payer: Medicare Other | Source: Ambulatory Visit | Admitting: Urology

## 2017-12-27 ENCOUNTER — Encounter
Admission: EM | Disposition: A | Discharge status: Home Health | Payer: Self-pay | Source: Home / Self Care | Attending: Family Medicine

## 2017-12-27 ENCOUNTER — Encounter: Payer: Self-pay | Admitting: Anesthesiology

## 2017-12-27 DIAGNOSIS — N1330 Unspecified hydronephrosis: Secondary | ICD-10-CM

## 2017-12-27 DIAGNOSIS — C678 Malignant neoplasm of overlapping sites of bladder: Secondary | ICD-10-CM

## 2017-12-27 DIAGNOSIS — R338 Other retention of urine: Secondary | ICD-10-CM | POA: Diagnosis not present

## 2017-12-27 DIAGNOSIS — N403 Nodular prostate with lower urinary tract symptoms: Secondary | ICD-10-CM

## 2017-12-27 HISTORY — PX: CYSTOSCOPY W/ URETERAL STENT PLACEMENT: SHX1429

## 2017-12-27 HISTORY — PX: TRANSURETHRAL RESECTION OF BLADDER TUMOR: SHX2575

## 2017-12-27 LAB — BASIC METABOLIC PANEL
Anion gap: 8 (ref 5–15)
BUN: 15 mg/dL (ref 6–20)
CALCIUM: 8.3 mg/dL — AB (ref 8.9–10.3)
CO2: 22 mmol/L (ref 22–32)
CREATININE: 0.86 mg/dL (ref 0.61–1.24)
Chloride: 109 mmol/L (ref 101–111)
GFR calc Af Amer: >60 mL/min (ref >60)
GFR calc non Af Amer: >60 mL/min (ref >60)
GLUCOSE: 119 mg/dL — AB (ref 65–99)
Potassium: 3.6 mmol/L (ref 3.5–5.1)
Sodium: 139 mmol/L (ref 135–145)

## 2017-12-27 LAB — CBC
HCT: 32.3 % — ABNORMAL LOW (ref 40.0–52.0)
Hemoglobin: 10.7 g/dL — ABNORMAL LOW (ref 13.0–18.0)
MCH: 29 pg (ref 26.0–34.0)
MCHC: 33.3 g/dL (ref 32.0–36.0)
MCV: 87.2 fL (ref 80.0–100.0)
PLATELETS: 338 10*3/uL (ref 150–440)
RBC: 3.7 MIL/uL — ABNORMAL LOW (ref 4.40–5.90)
RDW: 15.2 % — AB (ref 11.5–14.5)
WBC: 11.4 10*3/uL — ABNORMAL HIGH (ref 3.8–10.6)

## 2017-12-27 SURGERY — CYSTOSCOPY, WITH RETROGRADE PYELOGRAM AND URETERAL STENT INSERTION
Anesthesia: General | Site: Ureter | Wound class: Dirty or Infected

## 2017-12-27 MED ORDER — GLYCOPYRROLATE 0.2 MG/ML IJ SOLN
INTRAMUSCULAR | Status: DC | PRN
  Administered 2017-12-27: 0.2 mg via INTRAVENOUS

## 2017-12-27 MED ORDER — PHENYLEPHRINE HCL 10 MG/ML IJ SOLN
INTRAMUSCULAR | Status: DC | PRN
  Administered 2017-12-27 (×3): 100 ug via INTRAVENOUS

## 2017-12-27 MED ORDER — ONDANSETRON HCL 4 MG/2ML IJ SOLN
INTRAMUSCULAR | Status: DC | PRN
  Administered 2017-12-27: 4 mg via INTRAVENOUS

## 2017-12-27 MED ORDER — FENTANYL CITRATE (PF) 100 MCG/2ML IJ SOLN
INTRAMUSCULAR | Status: DC | PRN
  Administered 2017-12-27 (×2): 25 ug via INTRAVENOUS
  Administered 2017-12-27: 50 ug via INTRAVENOUS
  Administered 2017-12-27: 25 ug via INTRAVENOUS
  Administered 2017-12-27: 50 ug via INTRAVENOUS
  Administered 2017-12-27: 25 ug via INTRAVENOUS

## 2017-12-27 MED ORDER — LIDOCAINE HCL (CARDIAC) 20 MG/ML IV SOLN
INTRAVENOUS | Status: DC | PRN
  Administered 2017-12-27: 60 mg via INTRAVENOUS

## 2017-12-27 MED ORDER — FAMOTIDINE 20 MG PO TABS
ORAL_TABLET | ORAL | Status: AC
  Administered 2017-12-27: 20 mg via ORAL
  Filled 2017-12-27: qty 1

## 2017-12-27 MED ORDER — ONDANSETRON HCL 4 MG/2ML IJ SOLN: 4.0000 mg | Freq: Once | INTRAMUSCULAR | Status: DC | PRN

## 2017-12-27 MED ORDER — VANCOMYCIN HCL IN DEXTROSE 1-5 GM/200ML-% IV SOLN
1000.0000 mg | Freq: Two times a day (BID) | INTRAVENOUS | Status: DC
  Administered 2017-12-27 – 2017-12-29 (×4): 1000 mg via INTRAVENOUS
  Filled 2017-12-27 (×4): qty 200

## 2017-12-27 MED ORDER — PROPOFOL 10 MG/ML IV BOLUS
INTRAVENOUS | Status: DC | PRN
  Administered 2017-12-27: 120 mg via INTRAVENOUS

## 2017-12-27 MED ORDER — FENTANYL CITRATE (PF) 100 MCG/2ML IJ SOLN
25.0000 ug | INTRAMUSCULAR | Status: DC | PRN
  Administered 2017-12-27 (×2): 25 ug via INTRAVENOUS

## 2017-12-27 MED ORDER — FENTANYL CITRATE (PF) 100 MCG/2ML IJ SOLN
INTRAMUSCULAR | Status: AC
  Administered 2017-12-27: 25 ug via INTRAVENOUS
  Filled 2017-12-27: qty 2

## 2017-12-27 MED ORDER — CIPROFLOXACIN IN D5W 400 MG/200ML IV SOLN: 400.0000 mg | INTRAVENOUS | Status: DC

## 2017-12-27 MED ORDER — PIPERACILLIN-TAZOBACTAM 3.375 G IVPB
3.3750 g | Freq: Three times a day (TID) | INTRAVENOUS | Status: DC
  Administered 2017-12-27 – 2017-12-29 (×5): 3.375 g via INTRAVENOUS
  Filled 2017-12-27 (×5): qty 50

## 2017-12-27 MED ORDER — LACTATED RINGERS IV SOLN: INTRAVENOUS | Status: DC

## 2017-12-27 MED ORDER — DEXAMETHASONE SODIUM PHOSPHATE 10 MG/ML IJ SOLN
INTRAMUSCULAR | Status: DC | PRN
  Administered 2017-12-27: 5 mg via INTRAVENOUS

## 2017-12-27 MED ORDER — IOTHALAMATE MEGLUMINE 43 % IV SOLN
INTRAVENOUS | Status: DC | PRN
  Administered 2017-12-27: 40 mL via URETHRAL

## 2017-12-27 MED ORDER — FAMOTIDINE 20 MG PO TABS
20.0000 mg | ORAL_TABLET | Freq: Once | ORAL | Status: AC
  Administered 2017-12-27: 20 mg via ORAL

## 2017-12-27 MED ORDER — CIPROFLOXACIN IN D5W 400 MG/200ML IV SOLN
INTRAVENOUS | Status: AC
  Filled 2017-12-27: qty 200

## 2017-12-27 SURGICAL SUPPLY — 40 items
ADAPTER IRRIG TUBE 2 SPIKE SOL (ADAPTER) IMPLANT
BAG DRAIN CYSTO-URO LG1000N (MISCELLANEOUS) ×6 IMPLANT
BAG URO DRAIN 4000ML (MISCELLANEOUS) ×6 IMPLANT
BRUSH SCRUB EZ  4% CHG (MISCELLANEOUS) ×2
BRUSH SCRUB EZ 4% CHG (MISCELLANEOUS) ×4 IMPLANT
CATH FOL 2WAY LX 24X30 (CATHETERS) IMPLANT
CATH FOLEY 3WAY 30CC 22FR (CATHETERS) ×6 IMPLANT
CATH URETL 5X70 OPEN END (CATHETERS) ×6 IMPLANT
CONRAY 43 FOR UROLOGY 50M (MISCELLANEOUS) ×6 IMPLANT
DRAPE UTILITY 15X26 TOWEL STRL (DRAPES) ×6 IMPLANT
DRSG TELFA 4X3 1S NADH ST (GAUZE/BANDAGES/DRESSINGS) ×12 IMPLANT
ELECT LOOP 22F BIPOLAR SML (ELECTROSURGICAL) ×6
ELECT REM PT RETURN 9FT ADLT (ELECTROSURGICAL) ×6
ELECTRODE LOOP 22F BIPOLAR SML (ELECTROSURGICAL) ×4 IMPLANT
ELECTRODE REM PT RTRN 9FT ADLT (ELECTROSURGICAL) ×4 IMPLANT
GLIDEWIRE STIFF .35X180X3 HYDR (WIRE) ×6 IMPLANT
GLOVE BIO SURGEON STRL SZ 6.5 (GLOVE) ×10 IMPLANT
GLOVE BIO SURGEONS STRL SZ 6.5 (GLOVE) ×2
GOWN STRL REUS W/ TWL LRG LVL3 (GOWN DISPOSABLE) ×8 IMPLANT
GOWN STRL REUS W/TWL LRG LVL3 (GOWN DISPOSABLE) ×4
HOLDER FOLEY CATH W/STRAP (MISCELLANEOUS) ×6 IMPLANT
KIT TURNOVER CYSTO (KITS) ×6 IMPLANT
LOOP CUT BIPOLAR 24F LRG (ELECTROSURGICAL) IMPLANT
NDL SAFETY ECLIPSE 18X1.5 (NEEDLE) IMPLANT
NEEDLE HYPO 18GX1.5 SHARP (NEEDLE)
PACK CYSTO AR (MISCELLANEOUS) ×6 IMPLANT
PLUG CATH AND CAP STER (CATHETERS) ×6 IMPLANT
SENSORWIRE 0.038 NOT ANGLED (WIRE) ×6
SET CYSTO W/LG BORE CLAMP LF (SET/KITS/TRAYS/PACK) ×6 IMPLANT
SET IRRIG Y TYPE TUR BLADDER L (SET/KITS/TRAYS/PACK) ×6 IMPLANT
SET IRRIGATING DISP (SET/KITS/TRAYS/PACK) ×6 IMPLANT
SOL .9 NS 3000ML IRR  AL (IV SOLUTION) ×12
SOL .9 NS 3000ML IRR UROMATIC (IV SOLUTION) ×24 IMPLANT
STENT URO INLAY 6FRX26CM (STENTS) ×12 IMPLANT
SURGILUBE 2OZ TUBE FLIPTOP (MISCELLANEOUS) ×6 IMPLANT
SYR TOOMEY 50ML (SYRINGE) IMPLANT
SYRINGE IRR TOOMEY STRL 70CC (SYRINGE) ×6 IMPLANT
WATER STERILE IRR 1000ML POUR (IV SOLUTION) ×6 IMPLANT
WATER STERILE IRR 3000ML UROMA (IV SOLUTION) ×6 IMPLANT
WIRE SENSOR 0.038 NOT ANGLED (WIRE) ×4 IMPLANT

## 2017-12-27 NOTE — Progress Notes (Signed)
[place] NAME: Vincent Poole    MR#:  [id number]  DATE OF BIRTH:  January 25, 1935  SUBJECTIVE:  CHIEF COMPLAINT:   Chief Complaint  Patient presents with  . Urinary Catheter Problems  For operative intervention  REVIEW OF SYSTEMS:  CONSTITUTIONAL: No fever, fatigue or weakness.  EYES: No blurred or double vision.  EARS, NOSE, AND THROAT: No tinnitus or ear pain.  RESPIRATORY: No cough, shortness of breath, wheezing or hemoptysis.  CARDIOVASCULAR: No chest pain, orthopnea, edema.  GASTROINTESTINAL: No nausea, vomiting, diarrhea or abdominal pain.  GENITOURINARY: No dysuria, hematuria.  ENDOCRINE: No polyuria, nocturia,  HEMATOLOGY: No anemia, easy bruising or bleeding SKIN: No rash or lesion. MUSCULOSKELETAL: No joint pain or arthritis.   NEUROLOGIC: No tingling, numbness, weakness.  PSYCHIATRY: No anxiety or depression.   ROS  DRUG ALLERGIES:   Allergies  Allergen Reactions  . Penicillins Other (See Comments)    Caused nervousness as a child. Patient states he took as a teenager w/o problems Has patient had a PCN reaction causing immediate rash, facial/tongue/throat swelling, SOB or lightheadedness with hypotension: No Has patient had a PCN reaction causing severe rash involving mucus membranes or skin necrosis: No Has patient had a PCN reaction that required hospitalization: No Has patient had a PCN reaction occurring within the last 10 years: No If all of the above answers are "NO", then may proceed with    VITALS:  Blood pressure (!) 140/58, pulse (!) 115, temperature 99 F (37.2 C), temperature source Oral, resp. rate (!) 21, height 5\' 11"  (1.803 m), weight 84.1 kg (185 lb 6.5 oz), SpO2 94 %.  PHYSICAL EXAMINATION:  GENERAL:  83 y.o.-year-old patient lying in the bed with no acute distress.  EYES: Pupils equal, round, reactive to light and accommodation. No scleral icterus. Extraocular muscles intact.  HEENT: Head  atraumatic, normocephalic. Oropharynx and nasopharynx clear.  NECK:  Supple, no jugular venous distention. No thyroid enlargement, no tenderness.  LUNGS: Normal breath sounds bilaterally, no wheezing, rales,rhonchi or crepitation. No use of accessory muscles of respiration.  CARDIOVASCULAR: S1, S2 normal. No murmurs, rubs, or gallops.  ABDOMEN: Soft, nontender, nondistended. Bowel sounds present. No organomegaly or mass.  EXTREMITIES: No pedal edema, cyanosis, or clubbing.  NEUROLOGIC: Cranial nerves II through XII are intact. Muscle strength 5/5 in all extremities. Sensation intact. Gait not checked.  PSYCHIATRIC: The patient is alert and oriented x 3.  SKIN: No obvious rash, lesion, or ulcer.   Physical Exam LABORATORY PANEL:   CBC Recent Labs  Lab 12/27/17 0644  WBC 11.4*  HGB 10.7*  HCT 32.3*  PLT 338   ------------------------------------------------------------------------------------------------------------------  Chemistries  Recent Labs  Lab 12/27/17 0644  NA 139  K 3.6  CL 109  CO2 22  GLUCOSE 119*  BUN 15  CREATININE 0.86  CALCIUM 8.3*   ------------------------------------------------------------------------------------------------------------------  Cardiac Enzymes No results for input(s): TROPONINI in the last 168 hours. ------------------------------------------------------------------------------------------------------------------  RADIOLOGY:  Ct Renal Stone Study  Result Date: 12/26/2017 CLINICAL DATA:  Penile pain and decreased urinary output. History of chronic UTI. EXAM: CT ABDOMEN AND PELVIS WITHOUT CONTRAST TECHNIQUE: Multidetector CT imaging of the abdomen and pelvis was performed following the standard protocol without IV contrast. COMPARISON:  CT abdomen pelvis dated December 20, 2017. FINDINGS: Lower chest: No acute abnormality. Bilateral gynecomastia. Three-vessel coronary artery atherosclerosis. Hepatobiliary: No focal liver abnormality is  seen. Status post cholecystectomy. No biliary dilatation. Pancreas: Unremarkable. No pancreatic ductal dilatation or surrounding  inflammatory changes. Spleen: Normal in size without focal abnormality. Adrenals/Urinary Tract: The adrenal glands are unremarkable. New moderate bilateral hydronephrosis. No renal or ureteral calculi. Foley catheter within the decompressed bladder. Persistent perivesicular inflammatory changes and diffuse bladder wall thickening, with unchanged intermediate density filling defect within the bladder. Stomach/Bowel: Small hiatal hernia. The stomach is otherwise within normal limits. The appendix is normal. No bowel wall thickening, distention, or surrounding inflammatory changes. Vascular/Lymphatic: Mild aortic atherosclerosis. No enlarged abdominal or pelvic lymph nodes. Reproductive: The prostate gland remains enlarged. Other: No free fluid or pneumoperitoneum. Musculoskeletal: No acute or significant osseous findings. Severe degenerative changes of the bilateral hips. Severe osteopenia. IMPRESSION: 1. New moderate bilateral hydronephrosis. Unchanged diffuse bladder wall thickening and perivesicular inflammatory changes with intermediate soft tissue density filling defect within the bladder concerning for hematoma or urothelial lesion. Correlation with cystoscopy is again advised. 2.  Aortic atherosclerosis (ICD10-I70.0). Electronically Signed   By: Titus Dubin M.D.   On: 12/26/2017 17:55    ASSESSMENT AND PLAN:  1 acute sepsis due to recurrent UTI Resolving Status post cystoscopy/bilateral ureteral stent placement/TURBT w/ partial bladder cancer resection/TURP with prostate biopsy on December 27, 2017 by Dr. Novella Olive, follow-up on path, continue empiric vancomycin/Zosyn, follow-up on cultures  2 AKI Most likely secondary to hydronephrosis  Continue to avoid nephrotoxic agents, strict I&O monitoring, Flomax, Proscar, BMP daily   3 acute moderate bilateral  hydronephrosis with hematoma or urothelial lesion Status post procedure as stated above Urology following  All the records are reviewed and case discussed with Care Management/Social Workerr. Management plans discussed with the patient, family and they are in agreement.  CODE STATUS: full  TOTAL TIME TAKING CARE OF THIS PATIENT: 35 minutes.     POSSIBLE D/C IN 1-3 DAYS, DEPENDING ON CLINICAL CONDITION.   Avel Peace Salary M.D on 12/27/2017   Between 7am to 6pm - Pager - [phone]  After 6pm go to [email or web address] - password [email or web address] [facility]  Office  [phone]  CC: Primary care physician; [name], MD  Note: This dictation was prepared with Dragon dictation along with smaller phrase technology. Any transcriptional errors that result from this process are unintentional.

## 2017-12-27 NOTE — Anesthesia Postprocedure Evaluation (Signed)
Anesthesia Post Note  Patient: Vincent Poole  Procedure(s) Performed: CYSTOSCOPY WITH RETROGRADE PYELOGRAM/URETERAL STENT PLACEMENT (Bilateral Ureter) TRANSURETHRAL RESECTION OF BLADDER TUMOR (TURBT) (N/A Bladder)  Patient location during evaluation: PACU Anesthesia Type: General Level of consciousness: awake and alert Pain management: pain level controlled Vital Signs Assessment: post-procedure vital signs reviewed and stable Respiratory status: spontaneous breathing, nonlabored ventilation, respiratory function stable and patient connected to nasal cannula oxygen Cardiovascular status: blood pressure returned to baseline and stable Postop Assessment: no apparent nausea or vomiting Anesthetic complications: no     Last Vitals:  Vitals:   12/27/17 1312 12/27/17 1318  BP:  103/66  Pulse: 99 (!) 107  Resp: (!) 22 (!) 21  Temp:  36.5 C  SpO2: 100% 97%    Last Pain:  Vitals:   12/27/17 1312  TempSrc:   PainSc: [address]

## 2017-12-27 NOTE — Anesthesia Post-op Follow-up Note (Signed)
Anesthesia QCDR form completed.        

## 2017-12-27 NOTE — Anesthesia Procedure Notes (Signed)
Procedure Name: LMA Insertion Date/Time: 12/27/2017 11:05 AM Performed by: Doreen Salvage, CRNA Pre-anesthesia Checklist: Patient identified, Patient being monitored, Timeout performed, Emergency Drugs available and Suction available Patient Re-evaluated:Patient Re-evaluated prior to induction Oxygen Delivery Method: Circle system utilized Preoxygenation: Pre-oxygenation with 100% oxygen Induction Type: IV induction Ventilation: Mask ventilation without difficulty LMA: LMA inserted LMA Size: 4.0 Tube type: Oral Number of attempts: 1 Placement Confirmation: positive ETCO2 and breath sounds checked- equal and bilateral Tube secured with: Tape Dental Injury: Teeth and Oropharynx as per pre-operative assessment

## 2017-12-27 NOTE — Progress Notes (Addendum)
Urology Consult Follow Up  Subjective: No further fevers overnight.  Normotensive, tachycardia resolved, afebrile overnight.  WBC normalizing.  Creatinine down to baseline.  Catheter draining well.  Overall, doing well this morning. ,  Anti-infectives: Anti-infectives (From admission, onward)   Start     Dose/Rate Route Frequency Ordered Stop   12/26/17 2200  aztreonam (AZACTAM) 1 g in sodium chloride 0.9 % 100 mL IVPB     1 g 200 mL/hr over 30 Minutes Intravenous Every 8 hours 12/26/17 1845     12/26/17 2053  levofloxacin (LEVAQUIN) 750 MG/150ML IVPB    Comments:  Vincent Poole, Vincent Poole: cabinet override      12/26/17 2053 12/27/17 0859   12/26/17 2000  levofloxacin (LEVAQUIN) IVPB 750 mg     750 mg 100 mL/hr over 90 Minutes Intravenous Every 48 hours 12/26/17 1845     12/26/17 1830  vancomycin (VANCOCIN) IVPB 1000 mg/200 mL premix     1,000 mg 200 mL/hr over 60 Minutes Intravenous  Once 12/26/17 1825 12/26/17 2051   12/26/17 1700  piperacillin-tazobactam (ZOSYN) IVPB 3.375 g     3.375 g 100 mL/hr over 30 Minutes Intravenous  Once 12/26/17 1645 12/26/17 1741      Current Facility-Administered Medications  Medication Dose Route Frequency Provider Last Rate Last Dose  . 0.9 %  sodium chloride infusion   Intravenous Continuous Demetrios Loll, MD 100 mL/hr at 12/26/17 2209    . acetaminophen (TYLENOL) tablet 650 mg  650 mg Oral Q6H PRN Demetrios Loll, MD       Or  . acetaminophen (TYLENOL) suppository 650 mg  650 mg Rectal Q6H PRN Demetrios Loll, MD      . albuterol (PROVENTIL) (2.5 MG/3ML) 0.083% nebulizer solution 2.5 mg  2.5 mg Nebulization Q2H PRN Demetrios Loll, MD      . aztreonam (AZACTAM) 1 g in sodium chloride 0.9 % 100 mL IVPB  1 g Intravenous Q8H Demetrios Loll, MD   Stopped at 12/27/17 [phone]  . bisacodyl (DULCOLAX) EC tablet 5 mg  5 mg Oral Daily PRN Demetrios Loll, MD      . finasteride (PROSCAR) tablet 5 mg  5 mg Oral Daily Demetrios Loll, MD      . heparin injection 5,000 Units  5,000 Units  Subcutaneous Q8H Demetrios Loll, MD   5,000 Units at 12/27/17 [phone]  . HYDROcodone-acetaminophen (NORCO/VICODIN) 5-325 MG per tablet 1-2 tablet  1-2 tablet Oral Q4H PRN Demetrios Loll, MD      . levofloxacin (LEVAQUIN) 750 MG/150ML IVPB           . levofloxacin (LEVAQUIN) IVPB 750 mg  750 mg Intravenous Q48H Demetrios Loll, MD   Stopped at 12/26/17 2352  . ondansetron (ZOFRAN) tablet 4 mg  4 mg Oral Q6H PRN Demetrios Loll, MD       Or  . ondansetron [facility]) injection 4 mg  4 mg Intravenous Q6H PRN Demetrios Loll, MD      . senna-docusate (Senokot-S) tablet 1 tablet  1 tablet Oral QHS PRN Demetrios Loll, MD      . tamsulosin [facility]) capsule 0.4 mg  0.4 mg Oral BID Demetrios Loll, MD   0.4 mg at 12/26/17 2300     Objective: Vital signs in last 24 hours: Temp:  [96.2 F (35.7 C)-102.4 F (39.1 C)] 98.5 F (36.9 C) (02/20 0416) Pulse Rate:  [85-118] 96 (02/20 0416) Resp:  [15-25] 16 (02/20 0416) BP: (92-138)/(49-92) 92/60 (02/20 0416) SpO2:  [94 %-98 %] 98 % (02/20  1443) Weight:  [184 lb (83.5 kg)-185 lb 6.5 oz (84.1 kg)] 185 lb 6.5 oz (84.1 kg) (02/20 0416)  Intake/Output from previous day: 02/19 0701 - 02/20 0700 In: 3151.7 [I.V.:651.7; IV Piggyback:2500] Out: 1950 [Urine:1950] Intake/Output this shift: No intake/output data recorded.   Physical Exam  Lab Results:  Recent Labs    12/26/17 1608 12/27/17 0644  WBC 17.4* 11.4*  HGB 12.4* 10.7*  HCT 37.1* 32.3*  PLT 399 338   BMET Recent Labs    12/26/17 1608 12/27/17 0644  NA 136 139  K 3.7 3.6  CL 103 109  CO2 22 22  GLUCOSE 157* 119*  BUN 20 15  CREATININE 1.31* 0.86  CALCIUM 9.0 8.3*   PT/INR Recent Labs    12/26/17 2217  LABPROT 15.2  INR 1.21   ABG No results for input(s): PHART, HCO3 in the last 72 hours.  Invalid input(s): PCO2, PO2  Studies/Results: Ct Renal Stone Study  Result Date: 12/26/2017 CLINICAL DATA:  Penile pain and decreased urinary output. History of chronic UTI. EXAM: CT ABDOMEN AND PELVIS WITHOUT  CONTRAST TECHNIQUE: Multidetector CT imaging of the abdomen and pelvis was performed following the standard protocol without IV contrast. COMPARISON:  CT abdomen pelvis dated December 20, 2017. FINDINGS: Lower chest: No acute abnormality. Bilateral gynecomastia. Three-vessel coronary artery atherosclerosis. Hepatobiliary: No focal liver abnormality is seen. Status post cholecystectomy. No biliary dilatation. Pancreas: Unremarkable. No pancreatic ductal dilatation or surrounding inflammatory changes. Spleen: Normal in size without focal abnormality. Adrenals/Urinary Tract: The adrenal glands are unremarkable. New moderate bilateral hydronephrosis. No renal or ureteral calculi. Foley catheter within the decompressed bladder. Persistent perivesicular inflammatory changes and diffuse bladder wall thickening, with unchanged intermediate density filling defect within the bladder. Stomach/Bowel: Small hiatal hernia. The stomach is otherwise within normal limits. The appendix is normal. No bowel wall thickening, distention, or surrounding inflammatory changes. Vascular/Lymphatic: Mild aortic atherosclerosis. No enlarged abdominal or pelvic lymph nodes. Reproductive: The prostate gland remains enlarged. Other: No free fluid or pneumoperitoneum. Musculoskeletal: No acute or significant osseous findings. Severe degenerative changes of the bilateral hips. Severe osteopenia. IMPRESSION: 1. New moderate bilateral hydronephrosis. Unchanged diffuse bladder wall thickening and perivesicular inflammatory changes with intermediate soft tissue density filling defect within the bladder concerning for hematoma or urothelial lesion. Correlation with cystoscopy is again advised. 2.  Aortic atherosclerosis (ICD10-I70.0). Electronically Signed   By: Titus Dubin M.D.   On: 12/26/2017 17:55     Assessment: 83 year old male with recurrent UTIs, foul smelling urine, poorly visualized bladder with suggestion of possible infiltrating mass  on CT scan versus severe inflammation with suboptimal outpatient cystoscopy.  He was admitted for presumed sepsis overnight, but no elevated pro-calcitonin, lactate, and clinically well.  At this point in time, moving forward with cystoscopy in the operating room with possible bilateral retrograde pyelogram, possible ureteral stent placement, possible bladder/prostate biopsy in order to avoid continued delay of diagnostic evaluation.  Plan: -surgery today as above -f/u urine culture -continue abx as above     LOS: 1 day    [name] 12/27/2017

## 2017-12-27 NOTE — Op Note (Signed)
Date of procedure: 12/27/17  Preoperative diagnosis:  1. Bilateral hydronephrosis 2. Recurrent UTIs 3. Urinary retention 4. Malodorous urine 5. Possible bladder mass   Postoperative diagnosis:  1. Bilateral hydronephrosis 2. Recurrent UTIs 3. Urinary retention 4. Malodorous urine 5. Bladder tumor  Procedure: 1. Cystoscopy 2. Bilateral retrograde pyelogram 3. Bilateral ureteral stent placement 4. TURBT (tumor appears to be greater than 5 cm, however only a small portion of the tumor was resected for pathology) 5. TURP prostate biopsy  Surgeon: [name], MD  Anesthesia: General  Complications: None  Intraoperative findings: Grossly abnormal with necrotic prostate with landmarks no recognizable.  Massive tumor involving the majority of the bladder with grossly narcotic and calcified material is the primary tumor burden.  There appeared to be multifocal disease with a large burden on the left anterior bladder wall extending posteriorly as well as adjacent to the bladder neck and just beyond the right hemitrigone.  There is very little little normal recognizable bladder mucosa remaining.  Able to identify UOs, persistent moderate bilateral hydroureteronephrosis down to the level of the bladder.  Stents placed.  EBL: Minimal  Specimens: Bladder tumor, prostate tumor  Drains: 22 French three-way Foley catheter  Indication: Vincent Poole is a 83 y.o. patient with recurrent urinary tract infections, urinary retention, foul-smelling malodorous urine and suspicion for an underlying bladder malignancy.  He was admitted overnight with difficulty with his Foley catheter, leukocytosis and fever.  He remained afebrile and hemodynamically stable overnight with return to his baseline lab values this a.m.  As such, in order to expedite further diagnostic evaluation, the patient was taken to the operating room for the aforementioned procedure..  After reviewing the management options for  treatment, he elected to proceed with the above surgical procedure(s). We have discussed the potential benefits and risks of the procedure, side effects of the proposed treatment, the likelihood of the patient achieving the goals of the procedure, and any potential problems that might occur during the procedure or recuperation. Informed consent has been obtained.  Description of procedure:  The patient was taken to the operating room and general anesthesia was induced.  The patient was placed in the dorsal lithotomy position, prepped and draped in the usual sterile fashion, and preoperative antibiotics were administered. A preoperative time-out was performed.   A 21 French cystoscope was advanced per urethra into the bladder.  Of note, upon entry into the prostatic fossa, landmarks became nearly unrecognizable as there is significant amount of dense necrotic tissue with irregular contour highly concerning for possible locally invasive disease.  Within the bladder, a significant amount of necrotic material was identified, primarily adherent to the bladder wall some of which had overlying calcification.  There is very little recognizable bladder mucosa throughout the entire bladder.  There was some what appears to be viable bladder tumor near its base on the left anterior bladder wall extending posteriorly across the entire length of the bladder.  There is also nodular tumor at the bladder neck on the right extending of the right lateral wall and anteriorly.  This appeared to be nodular and viable tumor as well as opposed to the majority which appear to be grossly necrotic.  Overall, this measured greater than 5 cm.  Careful inspection of the bladder neck revealed this was also irregular.  The trigone however, appeared to be somewhat spared.  Ultimately, I ended up switching out the 21 French cystoscope for 26 French resectoscope for continuous bladder irrigation for more optimal visualization.  Using a laser  bridge, I was able to identify the left ureteral orifice which was cannulated using a 5 [place] open-ended ureteral catheter.  Attention was performed on the side which revealed a markedly dilated left distal ureter and after greater than 20 cc of contrast material in the site, failed to fill beyond the mid ureteral level.  I then attempted to cannulate the ureter using a sensor wire unsuccessfully.  I was ultimately successful in advancing an angled Glidewire up to level the renal pelvis with a good amount of manipulation.  A 5 French open-ended ureteral catheter was then advanced over the wire up to level the proximal ureter.  Additional contrast was then injected into the renal pelvis which revealed moderate hydronephrosis without any obvious filling defects.  The wire was then replaced with a sensor wire.  A 6 x 26 French Bard Optima ureteral stent was then advanced up to level of the kidney.  The wires partially drawn until focal is noted within the renal pelvis.  The wire was then fully withdrawn a full coils noted within the bladder.  On the right side, the UO was identified but there was tumor directly beyond this but did not appear to be involving the UO.  Using the same technique is unchanged previously, he was eventually able to cannulate the UO, perform a retrograde which showed a markedly dilated ureter, eventually able to achieve an angled Glidewire up to the level of the renal pelvis and shoot an additional retrograde using a 5 [place] open-ended ureteral catheter.  Again, this revealed moderate hydronephrosis.  The wire was then switched out for a sensor wire and the stent was placed as previously described.  Finally, in order to minimize manipulation today in light of his recent admission possible infection, I took several swipes of necrotic tumor along the left lateral wall and bladder neck as well as dyspnea on the left UO as this tumor appeared to be viable and less necrotic and the other  material.  I did attempt to take some deeper swipes to the muscularis propria in order to prove that this is probable muscle invasive disease.  The strips were then irrigated from the bladder.  Careful hemostasis was achieved.  Next the bipolar loop was used to take several swipes of the prostate and several necrotic areas as well as several more normal areas of the prostatic fossa as there is a high suspicion for prostatic involvement of this disease.  These were passed off the field as a separate specimen.  Careful hemostasis was achieved.  Finally, the scope was removed and a 22 [place] hematuria catheter was placed.  The balloon was filled with 30 cc of sterile water.  Hemostasis appeared to be adequate and did not appear to require CBI.  The third port was capped off as a precaution to be used later on if needed.   A bimanual exam was then performed.  The prostate appeared to be diffusely firm but had discrete borders.  The bladder was palpated which was somewhat firm and fixed was unclear whether this was secondary to invasive disease or his previous radiation.   Patient was then clean and dry, repositioned in supine position, reversed from anesthesia, taken to PACU in stable condition.  Plan: Findings were discussed with the patient's wife today.  I discussed the findings with the patient himself tomorrow morning on rounds.  All questions were answered.  Next steps to be determined by surgical pathology.  If this is  indeed muscle invasive bladder cancer is suspected, I do not believe that he is a surgical candidate for cystectomy.  [name], M.D.

## 2017-12-27 NOTE — Anesthesia Preprocedure Evaluation (Addendum)
Anesthesia Evaluation  Patient identified by MRN, date of birth, ID band Patient awake    Reviewed: Allergy & Precautions, NPO status , Patient's Chart, lab work & pertinent test results, reviewed documented beta blocker date and time   Airway Mallampati: II  TM Distance: >3 FB     Dental  (+) Chipped, Upper Dentures, Lower Dentures   Pulmonary           Cardiovascular hypertension, Pt. on medications      Neuro/Psych Anxiety  Neuromuscular disease    GI/Hepatic   Endo/Other    Renal/GU      Musculoskeletal  (+) Arthritis ,   Abdominal   Peds  Hematology   Anesthesia Other Findings Does have a tooth  At the bottom.  Reproductive/Obstetrics                            Anesthesia Physical Anesthesia Plan  ASA: III  Anesthesia Plan: General   Post-op Pain Management:    Induction: Intravenous  PONV Risk Score and Plan:   Airway Management Planned: LMA  Additional Equipment:   Intra-op Plan:   Post-operative Plan:   Informed Consent: I have reviewed the patients History and Physical, chart, labs and discussed the procedure including the risks, benefits and alternatives for the proposed anesthesia with the patient or authorized representative who has indicated his/her understanding and acceptance.     Plan Discussed with: CRNA  Anesthesia Plan Comments:         Anesthesia Quick Evaluation

## 2017-12-27 NOTE — Progress Notes (Signed)
Pharmacy Antibiotic Note  Vincent Poole is a 83 y.o. male admitted on 12/26/2017 with UTI and wound infxn.  Pharmacy has been consulted for vancomycin dosing.  WCx with GNR and GPC, renal function improved.   Plan: Vancomycin 1000 mg IV q12h  Ke 0.065, half life 10.7, Vd 59 L  Zosyn 3.375 g IV q8h EI  Height: 5\' 11"  (180.3 cm) Weight: 185 lb 6.5 oz (84.1 kg) IBW/kg (Calculated) : 75.3  Temp (24hrs), Avg:98.9 F (37.2 C), Min:97.7 F (36.5 C), Max:102.4 F (39.1 C)  Recent Labs  Lab 12/21/17 1137 12/26/17 1608 12/26/17 1649 12/26/17 2119 12/27/17 0644  WBC 13.9* 17.4*  --   --  11.4*  CREATININE 0.88 1.31*  --   --  0.86  LATICACIDVEN  --   --  1.8 1.2  --     Estimated Creatinine Clearance: 73 mL/min (by C-G formula based on SCr of 0.86 mg/dL).    Allergies  Allergen Reactions  . Penicillins Other (See Comments)    Caused nervousness as a child. Patient states he took as a teenager w/o problems Has patient had a PCN reaction causing immediate rash, facial/tongue/throat swelling, SOB or lightheadedness with hypotension: No Has patient had a PCN reaction causing severe rash involving mucus membranes or skin necrosis: No Has patient had a PCN reaction that required hospitalization: No Has patient had a PCN reaction occurring within the last 10 years: No If all of the above answers are "NO", then may proceed with    Antimicrobials this admission: Vanc/zosyn x1 each 2/19 Aztreonam/levofloxacin 2/19>>2/20 Vancomycin/Zosyn  2/20 >>  Dose adjustments this admission:   Microbiology results: BCx x2 NGTD UCx sent Wound cx pending    Thank you for allowing pharmacy to be a part of this patient's care.  Rocky Morel 12/27/2017 2:39 PM

## 2017-12-27 NOTE — Transfer of Care (Signed)
Immediate Anesthesia Transfer of Care Note  Patient: Vincent Poole  Procedure(s) Performed: Procedure(s): CYSTOSCOPY WITH RETROGRADE PYELOGRAM/URETERAL STENT PLACEMENT (Bilateral) TRANSURETHRAL RESECTION OF BLADDER TUMOR (TURBT) (N/A)  Patient Location: PACU  Anesthesia Type:General  Level of Consciousness: sedated  Airway & Oxygen Therapy: Patient Spontanous Breathing and Patient connected to face mask oxygen  Post-op Assessment: Report given to RN and Post -op Vital signs reviewed and stable  Post vital signs: Reviewed and stable  Last Vitals:  Vitals:   12/27/17 1031 12/27/17 1233  BP: (!) 112/52 100/60  Pulse: 86 76  Resp: 12 18  Temp: 37.1 C 36.6 C  SpO2: 03% 403%    Complications: No apparent anesthesia complications

## 2017-12-28 ENCOUNTER — Other Ambulatory Visit: Payer: Self-pay | Admitting: Urology

## 2017-12-28 ENCOUNTER — Inpatient Hospital Stay: Payer: Medicare Other

## 2017-12-28 LAB — URINE CULTURE: Culture: NO GROWTH

## 2017-12-28 MED ORDER — ADULT MULTIVITAMIN W/MINERALS CH
1.0000 | ORAL_TABLET | Freq: Every day | ORAL | Status: DC
  Administered 2017-12-28 – 2017-12-30 (×3): 1 via ORAL
  Filled 2017-12-28 (×3): qty 1

## 2017-12-28 MED ORDER — IOPAMIDOL (ISOVUE-300) INJECTION 61%
75.0000 mL | Freq: Once | INTRAVENOUS | Status: AC | PRN
  Administered 2017-12-28: 75 mL via INTRAVENOUS

## 2017-12-28 MED ORDER — ENSURE ENLIVE PO LIQD
237.0000 mL | Freq: Two times a day (BID) | ORAL | Status: DC
  Administered 2017-12-28 – 2017-12-30 (×4): 237 mL via ORAL

## 2017-12-28 NOTE — Progress Notes (Signed)
Urology Consult Follow Up  Subjective: POD 1 s/p bladder biopsy/ stents.  Discussed intraop findings today with patient and his wife.  UCx/ Bx negative.  Clinically feels better today.   ,  Anti-infectives: Anti-infectives (From admission, onward)   Start     Dose/Rate Route Frequency Ordered Stop   12/27/17 1600  vancomycin (VANCOCIN) IVPB 1000 mg/200 mL premix     1,000 mg 200 mL/hr over 60 Minutes Intravenous Every 12 hours 12/27/17 1438     12/27/17 1500  piperacillin-tazobactam (ZOSYN) IVPB 3.375 g     3.375 g 12.5 mL/hr over 240 Minutes Intravenous Every 8 hours 12/27/17 1448     12/27/17 1035  ciprofloxacin (CIPRO) IVPB 400 mg  Status:  Discontinued     400 mg 200 mL/hr over 60 Minutes Intravenous 60 min pre-op 12/27/17 1036 12/27/17 1346   12/26/17 2200  aztreonam (AZACTAM) 1 g in sodium chloride 0.9 % 100 mL IVPB  Status:  Discontinued     1 g 200 mL/hr over 30 Minutes Intravenous Every 8 hours 12/26/17 1845 12/27/17 1446   12/26/17 2053  levofloxacin (LEVAQUIN) 750 MG/150ML IVPB    Comments:  Derrek Gu Jesus, Hen: cabinet override      12/26/17 2053 12/27/17 0859   12/26/17 2000  levofloxacin (LEVAQUIN) IVPB 750 mg  Status:  Discontinued     750 mg 100 mL/hr over 90 Minutes Intravenous Every 48 hours 12/26/17 1845 12/27/17 1446   12/26/17 1830  vancomycin (VANCOCIN) IVPB 1000 mg/200 mL premix     1,000 mg 200 mL/hr over 60 Minutes Intravenous  Once 12/26/17 1825 12/26/17 2051   12/26/17 1700  piperacillin-tazobactam (ZOSYN) IVPB 3.375 g     3.375 g 100 mL/hr over 30 Minutes Intravenous  Once 12/26/17 1645 12/26/17 1741      Current Facility-Administered Medications  Medication Dose Route Frequency Provider Last Rate Last Dose  . 0.9 %  sodium chloride infusion   Intravenous Continuous Demetrios Loll, MD 100 mL/hr at 12/28/17 1101    . acetaminophen (TYLENOL) tablet 650 mg  650 mg Oral Q6H PRN Demetrios Loll, MD   650 mg at 12/28/17 1257   Or  . acetaminophen (TYLENOL)  suppository 650 mg  650 mg Rectal Q6H PRN Demetrios Loll, MD      . albuterol (PROVENTIL) (2.5 MG/3ML) 0.083% nebulizer solution 2.5 mg  2.5 mg Nebulization Q2H PRN Demetrios Loll, MD      . bisacodyl (DULCOLAX) EC tablet 5 mg  5 mg Oral Daily PRN Demetrios Loll, MD      . finasteride (PROSCAR) tablet 5 mg  5 mg Oral Daily Demetrios Loll, MD   5 mg at 12/28/17 0903  . heparin injection 5,000 Units  5,000 Units Subcutaneous Q8H Demetrios Loll, MD   5,000 Units at 12/28/17 [phone]  . HYDROcodone-acetaminophen (NORCO/VICODIN) 5-325 MG per tablet 1-2 tablet  1-2 tablet Oral Q4H PRN Demetrios Loll, MD   2 tablet at 12/27/17 1844  . ondansetron (ZOFRAN) tablet 4 mg  4 mg Oral Q6H PRN Demetrios Loll, MD       Or  . ondansetron [facility]) injection 4 mg  4 mg Intravenous Q6H PRN Demetrios Loll, MD      . piperacillin-tazobactam (ZOSYN) IVPB 3.375 g  3.375 g Intravenous Q8H Salary, Avel Peace, MD   Stopped at 12/28/17 1253  . senna-docusate (Senokot-S) tablet 1 tablet  1 tablet Oral QHS PRN Demetrios Loll, MD      . tamsulosin [facility]) capsule 0.4 mg  0.4 mg Oral BID Demetrios Loll, MD   0.4 mg at 12/28/17 9509  . vancomycin (VANCOCIN) IVPB 1000 mg/200 mL premix  1,000 mg Intravenous Q12H Rocky Morel, [facility]   Stopped at 12/28/17 3267     Objective: Vital signs in last 24 hours: Temp:  [97.4 F (36.3 C)-99.6 F (37.6 C)] 99.6 F (37.6 C) (02/21 1252) Pulse Rate:  [78-115] 103 (02/21 1252) Resp:  [16-22] 18 (02/21 1252) BP: (103-140)/(40-66) 114/40 (02/21 1252) SpO2:  [92 %-97 %] 97 % (02/21 1252)  Intake/Output from previous day: 02/20 0701 - 02/21 0700 In: 2000 [I.V.:1500; IV Piggyback:500] Out: 830 [Urine:830] Intake/Output this shift: Total I/O In: 240 [P.O.:240] Out: 200 [Urine:200]   Physical Exam  Constitutional: He is oriented to person, place, and time and well-developed, well-nourished, and in no distress.  Wife at bedside this AM.  Abdominal: Soft. He exhibits no distension.  Genitourinary: Penis normal.  Genitourinary  Comments: Foley draining light pink urine  Neurological: He is alert and oriented to person, place, and time.  Skin: Skin is warm.  Psychiatric: Affect normal.  Vitals reviewed.    Lab Results:  Recent Labs    12/26/17 1608 12/27/17 0644  WBC 17.4* 11.4*  HGB 12.4* 10.7*  HCT 37.1* 32.3*  PLT 399 338   BMET Recent Labs    12/26/17 1608 12/27/17 0644  NA 136 139  K 3.7 3.6  CL 103 109  CO2 22 22  GLUCOSE 157* 119*  BUN 20 15  CREATININE 1.31* 0.86  CALCIUM 9.0 8.3*   PT/INR Recent Labs    12/26/17 2217  LABPROT 15.2  INR 1.21   ABG No results for input(s): PHART, HCO3 in the last 72 hours.  Invalid input(s): PCO2, PO2  Studies/Results: Ct Renal Stone Study  Result Date: 12/26/2017 CLINICAL DATA:  Penile pain and decreased urinary output. History of chronic UTI. EXAM: CT ABDOMEN AND PELVIS WITHOUT CONTRAST TECHNIQUE: Multidetector CT imaging of the abdomen and pelvis was performed following the standard protocol without IV contrast. COMPARISON:  CT abdomen pelvis dated December 20, 2017. FINDINGS: Lower chest: No acute abnormality. Bilateral gynecomastia. Three-vessel coronary artery atherosclerosis. Hepatobiliary: No focal liver abnormality is seen. Status post cholecystectomy. No biliary dilatation. Pancreas: Unremarkable. No pancreatic ductal dilatation or surrounding inflammatory changes. Spleen: Normal in size without focal abnormality. Adrenals/Urinary Tract: The adrenal glands are unremarkable. New moderate bilateral hydronephrosis. No renal or ureteral calculi. Foley catheter within the decompressed bladder. Persistent perivesicular inflammatory changes and diffuse bladder wall thickening, with unchanged intermediate density filling defect within the bladder. Stomach/Bowel: Small hiatal hernia. The stomach is otherwise within normal limits. The appendix is normal. No bowel wall thickening, distention, or surrounding inflammatory changes. Vascular/Lymphatic: Mild  aortic atherosclerosis. No enlarged abdominal or pelvic lymph nodes. Reproductive: The prostate gland remains enlarged. Other: No free fluid or pneumoperitoneum. Musculoskeletal: No acute or significant osseous findings. Severe degenerative changes of the bilateral hips. Severe osteopenia. IMPRESSION: 1. New moderate bilateral hydronephrosis. Unchanged diffuse bladder wall thickening and perivesicular inflammatory changes with intermediate soft tissue density filling defect within the bladder concerning for hematoma or urothelial lesion. Correlation with cystoscopy is again advised. 2.  Aortic atherosclerosis (ICD10-I70.0). Electronically Signed   By: Titus Dubin M.D.   On: 12/26/2017 17:55     Assessment: 83 year old male with recurrent UTIs, foul smelling urine with infiltrating / necrotic bladder mass confirmed on cysto s/p bladder biopsy, bilateral ureteral stents.  All culture negative to date.  Plan: -maintain foley upon discharge -  awaiting pathology, will add onto tumor board conference for next week -recommend further staging with chest CT (as inpatient to expedite work up) -OK to transition to empiric oral antibiotics   LOS: 2 days    [name] 12/28/2017

## 2017-12-28 NOTE — Evaluation (Signed)
Physical Therapy Evaluation Patient Details Name: Vincent Poole MRN: [id number] DOB: Apr 28, 1935 Today's Date: 12/28/2017   History of Present Illness  Pt is an 83 y.o. male admitted with sepsis d/t recurrent UTI, acute renal failure, and new moderate B hydronephrosis.  Pt s/p cystoscopy, B ureteral stent placement, TURBT with partial bladder CA resection, and TURP with prostate biopsy 12/27/17.  PMH includes prostate CA, chronic foley catheter, small hiatal hernia, chronic UTI, htn, CTR.  Clinical Impression  Prior to hospital admission, pt was modified independent with ambulation (using SPC).  Pt lives with his wife in 1 level home with 2 steps to enter with L railing; reports no h/o falls.  Currently pt is SBA with bed mobility; CGA with transfers; and CGA ambulating around nursing loop with RW.  Pt appearing anxious and impulsive initially (complicated d/t pt HOH) but improved during session.  No c/o pain.  Overall pt demonstrating generalized weakness and decreased activity tolerance compared to baseline.  Pt would benefit from skilled PT to address noted impairments and functional limitations (see below for any additional details).  Upon hospital discharge, recommend pt discharge to home with HHPT.  Pt encouraged to ambulate with staff 3x's/day during hospitalization.    Follow Up Recommendations Home health PT    Equipment Recommendations  Rolling walker with 5" wheels(pt already owns RW)    Recommendations for Other Services       Precautions / Restrictions Precautions Precautions: Fall Precaution Comments: chronic foley catheter Restrictions Weight Bearing Restrictions: No      Mobility  Bed Mobility Overal bed mobility: Needs Assistance Bed Mobility: Supine to Sit;Sit to Supine     Supine to sit: Supervision;HOB elevated Sit to supine: Supervision;HOB elevated   General bed mobility comments: increased effort to perform but no physical assist  required  Transfers Overall transfer level: Needs assistance Equipment used: Rolling walker (2 wheeled) Transfers: Sit to/from Stand Sit to Stand: Min guard         General transfer comment: fairly strong stand from bed x2 trials; requires UE support for balance  Ambulation/Gait Ambulation/Gait assistance: Min guard Ambulation Distance (Feet): 220 Feet Assistive device: Rolling walker (2 wheeled)   Gait velocity: mildly decreased   General Gait Details: partial step through gait pattern; intermittent vc's to stay closer to RW during ambulation  Stairs            Wheelchair Mobility    Modified Rankin (Stroke Patients Only)       Balance Overall balance assessment: Needs assistance Sitting-balance support: No upper extremity supported;Feet supported Sitting balance-Leahy Scale: Normal Sitting balance - Comments: steady sitting reaching outside BOS   Standing balance support: Single extremity supported Standing balance-Leahy Scale: Poor Standing balance comment: requires at least single UE support for static standing balance                             Pertinent Vitals/Pain Pain Assessment: (pain in tip of penis comes and goes per pt (no pain during session)).  Vitals (HR and O2 on room air) stable and WFL throughout treatment session.    Home Living Family/patient expects to be discharged to:: Private residence Living Arrangements: Spouse/significant other Available Help at Discharge: Family;Available PRN/intermittently Type of Home: Mobile home Home Access: Stairs to enter Entrance Stairs-Rails: Left Entrance Stairs-Number of Steps: 2 Home Layout: One level Home Equipment: Walker - 2 wheels;Cane - single point      Prior Function Level  of Independence: Independent with assistive device(s)         Comments: Ambulates with SPC.  Reports no h/o falls.       Hand Dominance        Extremity/Trunk Assessment   Upper Extremity  Assessment Upper Extremity Assessment: Generalized weakness    Lower Extremity Assessment Lower Extremity Assessment: Generalized weakness    Cervical / Trunk Assessment Cervical / Trunk Assessment: Normal  Communication   Communication: HOH  Cognition Arousal/Alertness: Awake/alert Behavior During Therapy: Anxious;Impulsive Overall Cognitive Status: Within Functional Limits for tasks assessed                                        General Comments General comments (skin integrity, edema, etc.): Pink tinged urine in foley catheter (nursing aware).  Nursing cleared pt for participation in physical therapy.  Pt agreeable to PT session.  Pt's wife present during session.    Exercises     Assessment/Plan    PT Assessment Patient needs continued PT services  PT Problem List Decreased strength;Decreased activity tolerance;Decreased balance;Decreased mobility       PT Treatment Interventions DME instruction;Gait training;Stair training;Functional mobility training;Therapeutic activities;Therapeutic exercise;Balance training;Patient/family education    PT Goals (Current goals can be found in the Care Plan section)  Acute Rehab PT Goals Patient Stated Goal: to go home PT Goal Formulation: With patient Time For Goal Achievement: 01/11/18 Potential to Achieve Goals: Good    Frequency Min 2X/week   Barriers to discharge        Co-evaluation               AM-PAC PT "6 Clicks" Daily Activity  Outcome Measure Difficulty turning over in bed (including adjusting bedclothes, sheets and blankets)?: None Difficulty moving from lying on back to sitting on the side of the bed? : A Little Difficulty sitting down on and standing up from a chair with arms (e.g., wheelchair, bedside commode, etc,.)?: A Little Help needed moving to and from a bed to chair (including a wheelchair)?: A Little Help needed walking in hospital room?: A Little Help needed climbing 3-5  steps with a railing? : A Little 6 Click Score: 19    End of Session Equipment Utilized During Treatment: Gait belt Activity Tolerance: Patient tolerated treatment well Patient left: in bed;with call bell/phone within reach;with bed alarm set;with family/visitor present Nurse Communication: Mobility status;Precautions PT Visit Diagnosis: Other abnormalities of gait and mobility (R26.89);Muscle weakness (generalized) (M62.81)    Time: 6546-5035 PT Time Calculation (min) (ACUTE ONLY): 30 min   Charges:   PT Evaluation $PT Eval Low Complexity: 1 Low PT Treatments $Therapeutic Exercise: 8-22 mins   PT G CodesLeitha Bleak, PT 12/28/17, 4:47 PM [phone]

## 2017-12-28 NOTE — Progress Notes (Signed)
Initial Nutrition Assessment  DOCUMENTATION CODES:   Not applicable  INTERVENTION:   Ensure Enlive po BID, each supplement provides 350 kcal and 20 grams of protein  MVI daily  Magic cup TID with meals, each supplement provides 290 kcal and 9 grams of protein  Dysphagia 3 diet   NUTRITION DIAGNOSIS:   Increased nutrient needs related to cancer and cancer related treatments, other (see comment)(sepsis) as evidenced by increased estimated needs from protein.  GOAL:   Patient will meet greater than or equal to 90% of their needs  MONITOR:   PO intake, Supplement acceptance, Weight trends, Labs, Skin  REASON FOR ASSESSMENT:   Malnutrition Screening Tool    ASSESSMENT:   83 y/o male with h/o adenocarcinoma of prostate s/p XRT admitted with hydronephrosis, UTI, sepsis   Pt s/p cystoscopy/bilateral ureteral stent placement/TURBT w/ partial bladder cancer resection/TURP with prostate biopsy 2/20  Met with pt in room today. Pt reports good appetite and oral intake at baseline but reports that his appetite has been poor for the past week. Pt reports that food has been tasting horrible for a week but reports that today, his taste and appetite have returned. Pt eating 100% of meals today. Pt eating ice cream at time of RD visit. Pt reports that he does not drink supplements at home but has been considering getting Ensure. Per chart, pt with insignificant wt loss PTA. RD will order supplements and MVI. Pt reports difficulty chewing r/t missing top dentures; will change pt to dysphagia 3 diet.    Medications reviewed and include: heparin, zosyn, vancomycin, NaCl _0 /hr  Labs reviewed:   Nutrition-Focused physical exam completed. Findings are no fat depletion, no muscle depletion, and mild edema.   Diet Order:  DIET DYS 3 Room service appropriate? Yes; Fluid consistency: Thin  EDUCATION NEEDS:   Education needs have been addressed  Skin: Reviewed RN Assessment  Last BM:   PTA  Height:   Ht Readings from Last 1 Encounters:  12/26/17 _1  (1.803 m)    Weight:   Wt Readings from Last 1 Encounters:  12/27/17 185 lb 6.5 oz (84.1 kg)    Ideal Body Weight:  78.2 kg  BMI:  Body mass index is 25.86 kg/m.  Estimated Nutritional Needs:   Kcal:  1900-2200kcal/day   Protein:  93-109g/day   Fluid:  >2L/day   [name] MS, RD, LDN Pager #[phone] After Hours Pager: [phone]

## 2017-12-28 NOTE — Progress Notes (Signed)
[facility] [place] NAME: Vincent Poole    MR#:  [id number]  DATE OF BIRTH:  [birth year]  SUBJECTIVE:  CHIEF COMPLAINT:   Chief Complaint  Patient presents with  . Urinary Catheter Problems  Patient feeling better today, wife at the bedside, follow-up on cultures, physical therapy to see  REVIEW OF SYSTEMS:  CONSTITUTIONAL: No fever, fatigue or weakness.  EYES: No blurred or double vision.  EARS, NOSE, AND THROAT: No tinnitus or ear pain.  RESPIRATORY: No cough, shortness of breath, wheezing or hemoptysis.  CARDIOVASCULAR: No chest pain, orthopnea, edema.  GASTROINTESTINAL: No nausea, vomiting, diarrhea or abdominal pain.  GENITOURINARY: No dysuria, hematuria.  ENDOCRINE: No polyuria, nocturia,  HEMATOLOGY: No anemia, easy bruising or bleeding SKIN: No rash or lesion. MUSCULOSKELETAL: No joint pain or arthritis.   NEUROLOGIC: No tingling, numbness, weakness.  PSYCHIATRY: No anxiety or depression.   ROS  DRUG ALLERGIES:   Allergies  Allergen Reactions  . Penicillins Other (See Comments)    Caused nervousness as a child. Patient states he took as a teenager w/o problems Has patient had a PCN reaction causing immediate rash, facial/tongue/throat swelling, SOB or lightheadedness with hypotension: No Has patient had a PCN reaction causing severe rash involving mucus membranes or skin necrosis: No Has patient had a PCN reaction that required hospitalization: No Has patient had a PCN reaction occurring within the last 10 years: No If all of the above answers are "NO", then may proceed with    VITALS:  Blood pressure (!) 105/54, pulse 78, temperature 98 F (36.7 C), temperature source Oral, resp. rate 16, height 5\' 11"  (1.803 m), weight 84.1 kg (185 lb 6.5 oz), SpO2 97 %.  PHYSICAL EXAMINATION:  GENERAL:  83 y.o.-year-old patient lying in the bed with no acute distress.  EYES: Pupils equal, round, reactive to light and accommodation.  No scleral icterus. Extraocular muscles intact.  HEENT: Head atraumatic, normocephalic. Oropharynx and nasopharynx clear.  NECK:  Supple, no jugular venous distention. No thyroid enlargement, no tenderness.  LUNGS: Normal breath sounds bilaterally, no wheezing, rales,rhonchi or crepitation. No use of accessory muscles of respiration.  CARDIOVASCULAR: S1, S2 normal. No murmurs, rubs, or gallops.  ABDOMEN: Soft, nontender, nondistended. Bowel sounds present. No organomegaly or mass.  EXTREMITIES: No pedal edema, cyanosis, or clubbing.  NEUROLOGIC: Cranial nerves II through XII are intact. Muscle strength 5/5 in all extremities. Sensation intact. Gait not checked.  PSYCHIATRIC: The patient is alert and oriented x 3.  SKIN: No obvious rash, lesion, or ulcer.   Physical Exam LABORATORY PANEL:   CBC Recent Labs  Lab 12/27/17 0644  WBC 11.4*  HGB 10.7*  HCT 32.3*  PLT 338   ------------------------------------------------------------------------------------------------------------------  Chemistries  Recent Labs  Lab 12/27/17 0644  NA 139  K 3.6  CL 109  CO2 22  GLUCOSE 119*  BUN 15  CREATININE 0.86  CALCIUM 8.3*   ------------------------------------------------------------------------------------------------------------------  Cardiac Enzymes No results for input(s): TROPONINI in the last 168 hours. ------------------------------------------------------------------------------------------------------------------  RADIOLOGY:  Ct Renal Stone Study  Result Date: 12/26/2017 CLINICAL DATA:  Penile pain and decreased urinary output. History of chronic UTI. EXAM: CT ABDOMEN AND PELVIS WITHOUT CONTRAST TECHNIQUE: Multidetector CT imaging of the abdomen and pelvis was performed following the standard protocol without IV contrast. COMPARISON:  CT abdomen pelvis dated December 20, 2017. FINDINGS: Lower chest: No acute abnormality. Bilateral gynecomastia. Three-vessel coronary artery  atherosclerosis. Hepatobiliary: No focal liver abnormality is seen. Status post cholecystectomy. No  biliary dilatation. Pancreas: Unremarkable. No pancreatic ductal dilatation or surrounding inflammatory changes. Spleen: Normal in size without focal abnormality. Adrenals/Urinary Tract: The adrenal glands are unremarkable. New moderate bilateral hydronephrosis. No renal or ureteral calculi. Foley catheter within the decompressed bladder. Persistent perivesicular inflammatory changes and diffuse bladder wall thickening, with unchanged intermediate density filling defect within the bladder. Stomach/Bowel: Small hiatal hernia. The stomach is otherwise within normal limits. The appendix is normal. No bowel wall thickening, distention, or surrounding inflammatory changes. Vascular/Lymphatic: Mild aortic atherosclerosis. No enlarged abdominal or pelvic lymph nodes. Reproductive: The prostate gland remains enlarged. Other: No free fluid or pneumoperitoneum. Musculoskeletal: No acute or significant osseous findings. Severe degenerative changes of the bilateral hips. Severe osteopenia. IMPRESSION: 1. New moderate bilateral hydronephrosis. Unchanged diffuse bladder wall thickening and perivesicular inflammatory changes with intermediate soft tissue density filling defect within the bladder concerning for hematoma or urothelial lesion. Correlation with cystoscopy is again advised. 2.  Aortic atherosclerosis (ICD10-I70.0). Electronically Signed   By: Titus Dubin M.D.   On: 12/26/2017 17:55    ASSESSMENT AND PLAN:  1 acute sepsis due to recurrent UTI Resolved S/p cystoscopy/bilateral ureteral stent placement/TURBT w/ partial bladder cancer resection/TURP with prostate biopsy on December 27, 2017 by Dr. Novella Olive, recovering well, follow-up on cultures, follow-up on pathology, continue empiric Vanco/Zosyn  2 AKI Resolved Most likely secondary to hydronephrosis  Continue to avoid nephrotoxic agents, strict I&O  monitoring, Flomax, Proscar   3 acute moderate bilateral hydronephrosiswithhematoma or urothelial lesion Resolved Status post procedure as stated above Urology input greatly appreciated  4 generalized weakness Physical therapy to see   All the records are reviewed and case discussed with Care Management/Social Workerr. Management plans discussed with the patient, family and they are in agreement.  CODE STATUS: full  TOTAL TIME TAKING CARE OF THIS PATIENT: 45 minutes.     POSSIBLE D/C IN 1-2 DAYS, DEPENDING ON CLINICAL CONDITION.   Avel Peace Salary M.D on 12/28/2017   Between 7am to 6pm - Pager - [phone]  After 6pm go to [email or web address] - password [email or web address] [facility]  Office  [phone]  CC: Primary care physician; [name], MD  Note: This dictation was prepared with Dragon dictation along with smaller phrase technology. Any transcriptional errors that result from this process are unintentional.

## 2017-12-28 NOTE — Care Management Note (Signed)
Case Management Note  Patient Details  Name: EBEN CHOINSKI MRN: [id number] Date of Birth: 1934-11-12  Subjective/Objective:                  Admitted to [facility] with the diagnosis of sepsis. Lives with wife, [name], [phone]). Prescriptions are filled at [facility]. Last seen Dr. Netty Starring January 2019. No home Health. No skilled facility. No home oxygen. Uses a cane for balance as needed. Takes care of all basic activities of daily living himself, drives. No falls. Good appetite. Wife will transport. Cystoscopy 12/27/17. Foley in place  Action/Plan: Will continue to follow for discharge needs.   Expected Discharge Date:                  Expected Discharge Plan:     In-House Referral:   yes  Discharge planning Services   yes  Post Acute Care Choice:    Choice offered to:     DME Arranged:    DME Agency:     HH Arranged:    HH Agency:     Status of Service:     If discussed at [employer] of Stay Meetings, dates discussed:    Additional Comments:  Shelbie Ammons, RN MSN CCM Care Management [phone] 12/28/2017, 9:15 AM

## 2017-12-28 NOTE — Progress Notes (Signed)
Patient requesting tylenol for low grade fever.  Continue to monitor.

## 2017-12-28 NOTE — Care Management Important Message (Signed)
Important Message  Patient Details  Name: JUMA OXLEY MRN: [id number] Date of Birth: 06-Apr-1935   Medicare Important Message Given:  Yes    Shelbie Ammons, RN 12/28/2017, 7:16 AM

## 2017-12-29 LAB — SURGICAL PATHOLOGY

## 2017-12-29 LAB — CREATININE, SERUM
Creatinine, Ser: 0.95 mg/dL (ref 0.61–1.24)
GFR calc non Af Amer: >60 mL/min (ref >60)

## 2017-12-29 LAB — AEROBIC CULTURE W GRAM STAIN (SUPERFICIAL SPECIMEN): Culture: NORMAL

## 2017-12-29 LAB — AEROBIC CULTURE  (SUPERFICIAL SPECIMEN)

## 2017-12-29 MED ORDER — OXYBUTYNIN CHLORIDE 5 MG PO TABS
5.0000 mg | ORAL_TABLET | Freq: Three times a day (TID) | ORAL | Status: DC | PRN
  Administered 2017-12-29 (×2): 5 mg via ORAL
  Filled 2017-12-29 (×2): qty 1

## 2017-12-29 MED ORDER — CIPROFLOXACIN HCL 500 MG PO TABS
500.0000 mg | ORAL_TABLET | Freq: Two times a day (BID) | ORAL | Status: DC
  Administered 2017-12-29 – 2017-12-30 (×3): 500 mg via ORAL
  Filled 2017-12-29 (×3): qty 1

## 2017-12-29 MED ORDER — MORPHINE SULFATE (PF) 2 MG/ML IV SOLN: 2.0000 mg | INTRAVENOUS | Status: DC | PRN

## 2017-12-29 MED ORDER — TEMAZEPAM 15 MG PO CAPS
15.0000 mg | ORAL_CAPSULE | Freq: Every day | ORAL | Status: DC
  Administered 2017-12-29: 21:00:00 15 mg via ORAL
  Filled 2017-12-29: qty 1

## 2017-12-29 MED ORDER — HYDROCODONE-ACETAMINOPHEN 5-325 MG PO TABS
1.0000 | ORAL_TABLET | Freq: Four times a day (QID) | ORAL | Status: DC
  Administered 2017-12-29 – 2017-12-30 (×3): 1 via ORAL
  Filled 2017-12-29 (×4): qty 1

## 2017-12-29 NOTE — Progress Notes (Signed)
Urology Consult Follow Up  Subjective: Continues to do well.  Chest CT staging negative for metastatic disease.  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 ,  Anti-infectives: Anti-infectives (From admission, onward)   Start     Dose/Rate Route Frequency Ordered Stop   12/27/17 1600  vancomycin (VANCOCIN) IVPB 1000 mg/200 mL premix  Status:  Discontinued     1,000 mg 200 mL/hr over 60 Minutes Intravenous Every 12 hours 12/27/17 1438 12/29/17 0735   12/27/17 1500  piperacillin-tazobactam (ZOSYN) IVPB 3.375 g  Status:  Discontinued     3.375 g 12.5 mL/hr over 240 Minutes Intravenous Every 8 hours 12/27/17 1448 12/29/17 0735   12/27/17 1035  ciprofloxacin (CIPRO) IVPB 400 mg  Status:  Discontinued     400 mg 200 mL/hr over 60 Minutes Intravenous 60 min pre-op 12/27/17 1036 12/27/17 1346   12/26/17 2200  aztreonam (AZACTAM) 1 g in sodium chloride 0.9 % 100 mL IVPB  Status:  Discontinued     1 g 200 mL/hr over 30 Minutes Intravenous Every 8 hours 12/26/17 1845 12/27/17 1446   12/26/17 2053  levofloxacin (LEVAQUIN) 750 MG/150ML IVPB    Comments:  Vincent Poole, Vincent Poole: cabinet override      12/26/17 2053 12/27/17 0859   12/26/17 2000  levofloxacin (LEVAQUIN) IVPB 750 mg  Status:  Discontinued     750 mg 100 mL/hr over 90 Minutes Intravenous Every 48 hours 12/26/17 1845 12/27/17 1446   12/26/17 1830  vancomycin (VANCOCIN) IVPB 1000 mg/200 mL premix     1,000 mg 200 mL/hr over 60 Minutes Intravenous  Once 12/26/17 1825 12/26/17 2051   12/26/17 1700  piperacillin-tazobactam (ZOSYN) IVPB 3.375 g     3.375 g 100 mL/hr over 30 Minutes Intravenous  Once 12/26/17 1645 12/26/17 1741      Current Facility-Administered Medications  Medication Dose Route Frequency Provider Last Rate Last Dose  . 0.9 %  sodium chloride infusion   Intravenous Continuous Vincent Loll, MD 100 mL/hr at 12/29/17 0919    . acetaminophen (TYLENOL) tablet 650 mg   650 mg Oral Q6H PRN Vincent Loll, MD   650 mg at 12/28/17 1257   Or  . acetaminophen (TYLENOL) suppository 650 mg  650 mg Rectal Q6H PRN Vincent Loll, MD      . albuterol (PROVENTIL) (2.5 MG/3ML) 0.083% nebulizer solution 2.5 mg  2.5 mg Nebulization Q2H PRN Vincent Loll, MD      . bisacodyl (DULCOLAX) EC tablet 5 mg  5 mg Oral Daily PRN Vincent Loll, MD      . feeding supplement (ENSURE ENLIVE) (ENSURE ENLIVE) liquid 237 mL  237 mL Oral BID BM Vincent Poole, Vincent D, MD   237 mL at 12/29/17 0906  . finasteride (PROSCAR) tablet 5 mg  5 mg Oral Daily Vincent Loll, MD   5 mg at 12/29/17 0906  . heparin injection 5,000 Units  5,000 Units Subcutaneous Q8H Vincent Loll, MD   5,000 Units at 12/29/17 [phone]  . HYDROcodone-acetaminophen (NORCO/VICODIN) 5-325 MG per tablet 1-2 tablet  1-2 tablet Oral Q4H PRN Vincent Loll, MD   2 tablet at 12/27/17 1844  . multivitamin with minerals tablet 1 tablet  1 tablet Oral Daily Vincent Poole, Vincent Peace, MD   1 tablet at 12/29/17 0906  . ondansetron (ZOFRAN) tablet 4 mg  4 mg Oral Q6H PRN Vincent Loll, MD       Or  . ondansetron [facility]) injection 4 mg  4 mg  Intravenous Q6H PRN Vincent Loll, MD      . oxybutynin [facility]) tablet 5 mg  5 mg Oral Q8H PRN Vincent Foreman, MD   5 mg at 12/29/17 4540  . senna-docusate (Senokot-S) tablet 1 tablet  1 tablet Oral QHS PRN Vincent Loll, MD      . tamsulosin [facility]) capsule 0.4 mg  0.4 mg Oral BID Vincent Loll, MD   0.4 mg at 12/29/17 9811     Objective: Vital signs in last 24 hours: Temp:  [98.6 F (37 C)-100.1 F (37.8 C)] 98.6 F (37 C) (02/22 0903) Pulse Rate:  [90-103] 98 (02/22 0903) Resp:  [16-18] 16 (02/22 0903) BP: (98-123)/(40-58) 123/58 (02/22 0903) SpO2:  [95 %-99 %] 95 % (02/22 0903)  Intake/Output from previous day: 02/21 0701 - 02/22 0700 In: 300 [P.O.:300] Out: 1200 [Urine:1200] Intake/Output this shift: No intake/output data recorded.   Physical Exam  Constitutional: He is well-developed, well-nourished, and in no  distress.  Abdominal: Soft. He exhibits no distension.  Genitourinary:  Genitourinary Comments: Foley draining light pink urine  Skin: Skin is warm and dry.  Vitals reviewed.    Lab Results:  Recent Labs    12/26/17 1608 12/27/17 0644  WBC 17.4* 11.4*  HGB 12.4* 10.7*  HCT 37.1* 32.3*  PLT 399 338   BMET Recent Labs    12/26/17 1608 12/27/17 0644 12/29/17 0653  NA 136 139  --   K 3.7 3.6  --   CL 103 109  --   CO2 22 22  --   GLUCOSE 157* 119*  --   BUN 20 15  --   CREATININE 1.31* 0.86 0.95  CALCIUM 9.0 8.3*  --    PT/INR Recent Labs    12/26/17 2217  LABPROT 15.2  INR 1.21   ABG No results for input(s): PHART, HCO3 in the last 72 hours.  Invalid input(s): PCO2, PO2  Studies/Results: Ct Chest W Contrast  Result Date: 12/28/2017 CLINICAL DATA:  Bladder cancer.  Staging.  Asymptomatic. EXAM: CT CHEST WITH CONTRAST TECHNIQUE: Multidetector CT imaging of the chest was performed during intravenous contrast administration. CONTRAST:  84mL ISOVUE-300 IOPAMIDOL (ISOVUE-300) INJECTION 61% COMPARISON:  Abdominopelvic CT of 12/26/2017. No prior chest imaging. FINDINGS: Cardiovascular: Advanced aortic and branch vessel atherosclerosis. Normal heart size, without pericardial effusion. Multivessel coronary artery atherosclerosis. No central pulmonary embolism, on this non-dedicated study. Mediastinum/Nodes: No supraclavicular adenopathy. No mediastinal or hilar adenopathy. Tiny hiatal hernia. Subtle esophageal fluid level, including on image 116/2 Lungs/Pleura: Trace bilateral pleural thickening and fluid. Probable secretions in the dependent lower trachea. Bilateral lower lobe and dependent upper lobe subsegmental atelectasis. Minimal left major fissure thickening. No suspicious pulmonary nodule or mass. Bilateral pleural calcifications, including on image 74/2. Upper Abdomen: Cholecystectomy. Normal imaged portions of the liver, spleen, stomach, pancreas. Bilateral adrenal  thickening. Musculoskeletal: Mild bilateral gynecomastia. Advanced degenerate changes of both glenohumeral joints. Moderate diffuse idiopathic skeletal hyperostosis. IMPRESSION: 1.  No acute process or evidence of metastatic disease in the chest. 2. Bilateral pleural calcifications, suggesting asbestos related pleural disease. 3. Tiny hiatal hernia. Esophageal air fluid level suggests dysmotility or gastroesophageal reflux. 4. Coronary artery atherosclerosis. Aortic Atherosclerosis (ICD10-I70.0). 5. Gynecomastia. Electronically Signed   By: Abigail Miyamoto M.Poole.   On: 12/28/2017 14:44    Assessment: 83 year old male with recurrent UTIs, foul smelling urine with infiltrating / necrotic bladder mass confirmed on cysto s/p bladder biopsy, bilateral ureteral stents.  All culture negative to date.  Plan: -maintain foley upon discharge -  awaiting final pathology, will add onto tumor board conference for next week.  -OK to transition to empiric oral antibiotics - discharge appropriate from GU standpoint, will sign off  Follow up arrange for next week   LOS: 2 days    [name] 12/29/2017

## 2017-12-29 NOTE — Progress Notes (Signed)
Physical Therapy Treatment Patient Details Name: Vincent Poole MRN: [id number] DOB: [birth year] Today's Date: 12/29/2017    History of Present Illness Pt is an 83 y.o. male admitted with sepsis d/t recurrent UTI, acute renal failure, and new moderate B hydronephrosis.  Pt s/p cystoscopy, B ureteral stent placement, TURBT with partial bladder CA resection, and TURP with prostate biopsy 12/27/17.  PMH includes prostate CA, chronic foley catheter, small hiatal hernia, chronic UTI, htn, CTR.    PT Comments    Pt able to progress to ambulating around nursing loop with SPC today (used RW yesterday); pt CGA without loss of balance but tends to like to hold onto objects for balance if available.  No c/o pain during session.  Pt SOB towards end of ambulation (and appearing fatigued) but O2 sats 97% on room air; HR in 90's bpm.  Pt reporting not feeling well today and requiring encouragement to participate; pt appeared happy end of session with his functional mobility and being able to sit up in chair.  Encouraged pt to continue ambulating with staff (pt and pt's wife report he had not walked since last PT session yesterday).  Will continue to progress pt with strengthening, balance, and progressive functional mobility per pt tolerance.     Follow Up Recommendations  Home health PT     Equipment Recommendations  Cane    Recommendations for Other Services       Precautions / Restrictions Precautions Precautions: Fall Precaution Comments: chronic foley catheter Restrictions Weight Bearing Restrictions: No    Mobility  Bed Mobility Overal bed mobility: Needs Assistance Bed Mobility: Supine to Sit     Supine to sit: Supervision;HOB elevated     General bed mobility comments: increased effort to perform but no physical assist required  Transfers Overall transfer level: Needs assistance Equipment used: Straight cane Transfers: Sit to/from Stand Sit to Stand: Min guard          General transfer comment: increased effort to stand with SPC but steady (x2 trials to stand on own from bed)  Ambulation/Gait Ambulation/Gait assistance: Min guard(2nd assist for foley catheter and IV pole management) Ambulation Distance (Feet): 220 Feet Assistive device: Straight cane   Gait velocity: mildly decreased   General Gait Details: partial step through gait pattern; pt occasionally holding onto objects for balance   Stairs            Wheelchair Mobility    Modified Rankin (Stroke Patients Only)       Balance Overall balance assessment: Needs assistance Sitting-balance support: No upper extremity supported;Feet supported Sitting balance-Leahy Scale: Normal Sitting balance - Comments: steady sitting reaching outside BOS   Standing balance support: No upper extremity supported Standing balance-Leahy Scale: Fair Standing balance comment: steady static standing; pt likes at least single UE support in order to reach with other UE in standing                            Cognition Arousal/Alertness: Awake/alert Behavior During Therapy: Anxious Overall Cognitive Status: Within Functional Limits for tasks assessed                                        Exercises      General Comments   Nursing cleared pt for participation in physical therapy.  Pt agreeable to PT session.  Pt's wife present  during session.      Pertinent Vitals/Pain Pain Assessment: No/denies pain    Home Living                      Prior Function            PT Goals (current goals can now be found in the care plan section) Acute Rehab PT Goals Patient Stated Goal: to go home PT Goal Formulation: With patient Time For Goal Achievement: 01/11/18 Potential to Achieve Goals: Good Progress towards PT goals: Progressing toward goals    Frequency    Min 2X/week      PT Plan Current plan remains appropriate    Co-evaluation               AM-PAC PT "6 Clicks" Daily Activity  Outcome Measure  Difficulty turning over in bed (including adjusting bedclothes, sheets and blankets)?: None Difficulty moving from lying on back to sitting on the side of the bed? : A Little Difficulty sitting down on and standing up from a chair with arms (e.g., wheelchair, bedside commode, etc,.)?: A Little Help needed moving to and from a bed to chair (including a wheelchair)?: A Little Help needed walking in hospital room?: A Little Help needed climbing 3-5 steps with a railing? : A Little 6 Click Score: 19    End of Session Equipment Utilized During Treatment: Gait belt Activity Tolerance: Patient limited by fatigue;Other (comment)(SOB with ambulation) Patient left: in chair;with call bell/phone within reach;with chair alarm set;with family/visitor present Nurse Communication: Mobility status;Precautions PT Visit Diagnosis: Other abnormalities of gait and mobility (R26.89);Muscle weakness (generalized) (M62.81)     Time: 9323-5573 PT Time Calculation (min) (ACUTE ONLY): 38 min  Charges:  $Gait Training: 8-22 mins $Therapeutic Exercise: 8-22 mins $Therapeutic Activity: 8-22 mins                    G CodesLeitha Bleak, PT 12/29/17, 4:19 PM [phone]

## 2017-12-29 NOTE — Progress Notes (Signed)
[facility] [place] NAME: Vincent Poole    MR#:  [id number]  DATE OF BIRTH:  July 10, 1935  SUBJECTIVE:  CHIEF COMPLAINT:   Chief Complaint  Patient presents with  . Urinary Catheter Problems  Patient states that he does not feel well enough to go home, wife at the bedside, patient complains of groin pain as well as insomnia, and discussion with nursing staff-we will proceed to schedule pain medications, cultures are all negative-discontinue antibiotics, plan for home on tomorrow with home health PT  REVIEW OF SYSTEMS:  CONSTITUTIONAL: No fever, fatigue or weakness.  EYES: No blurred or double vision.  EARS, NOSE, AND THROAT: No tinnitus or ear pain.  RESPIRATORY: No cough, shortness of breath, wheezing or hemoptysis.  CARDIOVASCULAR: No chest pain, orthopnea, edema.  GASTROINTESTINAL: No nausea, vomiting, diarrhea or abdominal pain.  GENITOURINARY: No dysuria, hematuria.  ENDOCRINE: No polyuria, nocturia,  HEMATOLOGY: No anemia, easy bruising or bleeding SKIN: No rash or lesion. MUSCULOSKELETAL: No joint pain or arthritis.   NEUROLOGIC: No tingling, numbness, weakness.  PSYCHIATRY: No anxiety or depression.   ROS  DRUG ALLERGIES:   Allergies  Allergen Reactions  . Penicillins Other (See Comments)    Caused nervousness as a child. Patient states he took as a teenager w/o problems Has patient had a PCN reaction causing immediate rash, facial/tongue/throat swelling, SOB or lightheadedness with hypotension: No Has patient had a PCN reaction causing severe rash involving mucus membranes or skin necrosis: No Has patient had a PCN reaction that required hospitalization: No Has patient had a PCN reaction occurring within the last 10 years: No If all of the above answers are "NO", then may proceed with    VITALS:  Blood pressure (!) 123/58, pulse 98, temperature 98.6 F (37 C), temperature source Oral, resp. rate 16, height 5\' 11"  (1.803  m), weight 84.1 kg (185 lb 6.5 oz), SpO2 95 %.  PHYSICAL EXAMINATION:  GENERAL:  83 y.o.-year-old patient lying in the bed with no acute distress.  EYES: Pupils equal, round, reactive to light and accommodation. No scleral icterus. Extraocular muscles intact.  HEENT: Head atraumatic, normocephalic. Oropharynx and nasopharynx clear.  NECK:  Supple, no jugular venous distention. No thyroid enlargement, no tenderness.  LUNGS: Normal breath sounds bilaterally, no wheezing, rales,rhonchi or crepitation. No use of accessory muscles of respiration.  CARDIOVASCULAR: S1, S2 normal. No murmurs, rubs, or gallops.  ABDOMEN: Soft, nontender, nondistended. Bowel sounds present. No organomegaly or mass.  EXTREMITIES: No pedal edema, cyanosis, or clubbing.  NEUROLOGIC: Cranial nerves II through XII are intact. Muscle strength 5/5 in all extremities. Sensation intact. Gait not checked.  PSYCHIATRIC: The patient is alert and oriented x 3.  SKIN: No obvious rash, lesion, or ulcer.   Physical Exam LABORATORY PANEL:   CBC Recent Labs  Lab 12/27/17 0644  WBC 11.4*  HGB 10.7*  HCT 32.3*  PLT 338   ------------------------------------------------------------------------------------------------------------------  Chemistries  Recent Labs  Lab 12/27/17 0644 12/29/17 0653  NA 139  --   K 3.6  --   CL 109  --   CO2 22  --   GLUCOSE 119*  --   BUN 15  --   CREATININE 0.86 0.95  CALCIUM 8.3*  --    ------------------------------------------------------------------------------------------------------------------  Cardiac Enzymes No results for input(s): TROPONINI in the last 168 hours. ------------------------------------------------------------------------------------------------------------------  RADIOLOGY:  Ct Chest W Contrast  Result Date: 12/28/2017 CLINICAL DATA:  Bladder cancer.  Staging.  Asymptomatic. EXAM: CT CHEST  WITH CONTRAST TECHNIQUE: Multidetector CT imaging of the chest was  performed during intravenous contrast administration. CONTRAST:  33mL ISOVUE-300 IOPAMIDOL (ISOVUE-300) INJECTION 61% COMPARISON:  Abdominopelvic CT of 12/26/2017. No prior chest imaging. FINDINGS: Cardiovascular: Advanced aortic and branch vessel atherosclerosis. Normal heart size, without pericardial effusion. Multivessel coronary artery atherosclerosis. No central pulmonary embolism, on this non-dedicated study. Mediastinum/Nodes: No supraclavicular adenopathy. No mediastinal or hilar adenopathy. Tiny hiatal hernia. Subtle esophageal fluid level, including on image 116/2 Lungs/Pleura: Trace bilateral pleural thickening and fluid. Probable secretions in the dependent lower trachea. Bilateral lower lobe and dependent upper lobe subsegmental atelectasis. Minimal left major fissure thickening. No suspicious pulmonary nodule or mass. Bilateral pleural calcifications, including on image 74/2. Upper Abdomen: Cholecystectomy. Normal imaged portions of the liver, spleen, stomach, pancreas. Bilateral adrenal thickening. Musculoskeletal: Mild bilateral gynecomastia. Advanced degenerate changes of both glenohumeral joints. Moderate diffuse idiopathic skeletal hyperostosis. IMPRESSION: 1.  No acute process or evidence of metastatic disease in the chest. 2. Bilateral pleural calcifications, suggesting asbestos related pleural disease. 3. Tiny hiatal hernia. Esophageal air fluid level suggests dysmotility or gastroesophageal reflux. 4. Coronary artery atherosclerosis. Aortic Atherosclerosis (ICD10-I70.0). 5. Gynecomastia. Electronically Signed   By: Abigail Miyamoto M.D.   On: 12/28/2017 14:44    ASSESSMENT AND PLAN:  1 acute sepsis due to recurrent UTI Resolved S/p cystoscopy/bilateral ureteral stent placement/TURBT w/partial bladder cancer resection/TURP with prostate biopsy on December 27, 2017 by Dr. Novella Olive, recovering well, cultures negative,  to follow-up with urology status post discharge for pathology  results, to continue Foley, discontinue Vanco/Zosyn, and start p.o. Cipro to complete antibiotic course for 3 days  2 AKI Resolved Most likely secondary to hydronephrosis  Continue to avoid nephrotoxic agents, Flomax, Proscar  3 acutemoderate bilateral hydronephrosiswithhematoma or urothelial lesion Resolved S/p procedure as stated above Urology following as stated above   4 generalized weakness  Physical therapy recommending home health PT status post discharge  5 acute insomnia Restoril at at bedtime  6 acute pain syndrome Secondary to above Schedule Percocet and IV morphine as needed breakthrough pain  Disposition Home in 1 day with home health PT status post discharge barring any complications  All the records are reviewed and case discussed with Care Management/Social Workerr. Management plans discussed with the patient, family and they are in agreement.  CODE STATUS:   TOTAL TIME TAKING CARE OF THIS PATIENT: 45 minutes.     POSSIBLE D/C IN 1 DAYS, DEPENDING ON CLINICAL CONDITION.   Avel Peace Sayeed Weatherall M.D on 12/29/2017   Between 7am to 6pm - Pager - [phone]  After 6pm go to [email or web address] - password [email or web address] [facility]  Office  [phone]  CC: Primary care physician; [name], MD  Note: This dictation was prepared with Dragon dictation along with smaller phrase technology. Any transcriptional errors that result from this process are unintentional.

## 2017-12-29 NOTE — Care Management (Signed)
Physical therapy evaluation completed. Recommending home with home health/therapy. Discussed home health with Ms. Barthold. Declining any services at this time. Discharge to home 12/29/17 per Dr. Tyler Deis RN MSN CCM Care Management [phone]

## 2017-12-29 NOTE — Plan of Care (Signed)
  Spiritual Needs Ability to function at adequate level 12/29/2017 2008 - Progressing by Herbie Baltimore, RN   Education: Knowledge of General Education information will improve 12/29/2017 2008 - Progressing by Herbie Baltimore, RN   Activity: Risk for activity intolerance will decrease 12/29/2017 2008 - Progressing by Herbie Baltimore, RN   Elimination: Will not experience complications related to urinary retention 12/29/2017 2008 - Progressing by Herbie Baltimore, RN   Safety: Ability to remain free from injury will improve 12/29/2017 2008 - Progressing by Herbie Baltimore, RN   Fluid Volume: Hemodynamic stability will improve 12/29/2017 2008 - Progressing by Herbie Baltimore, RN   Fluid Volume: Hemodynamic stability will improve 12/29/2017 2008 - Progressing by Herbie Baltimore, RN   Clinical Measurements: Signs and symptoms of infection will decrease 12/29/2017 2008 - Progressing by Herbie Baltimore, RN

## 2017-12-30 LAB — CBC
HEMATOCRIT: 29 % — AB (ref 40.0–52.0)
Hemoglobin: 9.5 g/dL — ABNORMAL LOW (ref 13.0–18.0)
MCH: 28.4 pg (ref 26.0–34.0)
MCHC: 32.6 g/dL (ref 32.0–36.0)
MCV: 86.9 fL (ref 80.0–100.0)
PLATELETS: 298 10*3/uL (ref 150–440)
RBC: 3.33 MIL/uL — AB (ref 4.40–5.90)
RDW: 15.2 % — AB (ref 11.5–14.5)
WBC: 8.9 10*3/uL (ref 3.8–10.6)

## 2017-12-30 MED ORDER — HYDROCODONE-ACETAMINOPHEN 5-325 MG PO TABS: 1.0000 | ORAL_TABLET | Freq: Four times a day (QID) | ORAL | 0 refills | Status: DC | PRN

## 2017-12-30 MED ORDER — NITROFURANTOIN MONOHYD MACRO 100 MG PO CAPS: 100.0000 mg | ORAL_CAPSULE | Freq: Every day | ORAL | 0 refills | Status: AC

## 2017-12-30 MED ORDER — OXYBUTYNIN CHLORIDE 5 MG PO TABS: 5.0000 mg | ORAL_TABLET | Freq: Two times a day (BID) | ORAL | 0 refills | Status: DC

## 2017-12-30 NOTE — Progress Notes (Signed)
Patient discharged with wife and son. Indwelling foley catheter in place, both IV's removed, patient tolerate well. Patient and family verbalized understanding of education.

## 2017-12-30 NOTE — Discharge Instructions (Signed)
Resume diet and activity as before ° ° °

## 2017-12-30 NOTE — Care Management Note (Signed)
Case Management Note  Patient Details  Name: Vincent Poole MRN: [id number] Date of Birth: Mar 29, 1935  Subjective/Objective:  Discussed discharge planning with [name] who also refused an offer of HH-PT as did her husband yesterday. Advised [name] that she could request home health services from [name] PCP if she felt that [name] could benefit from physical therapy at a later date.                   Action/Plan:   Expected Discharge Date:  12/30/17               Expected Discharge Plan:     In-House Referral:     Discharge planning Services     Post Acute Care Choice:    Choice offered to:     DME Arranged:    DME Agency:     HH Arranged:    HH Agency:     Status of Service:     If discussed at [employer], dates discussed:    Additional Comments:  [name] A, RN 12/30/2017, 11:30 AM

## 2017-12-31 LAB — CULTURE, BLOOD (ROUTINE X 2)
CULTURE: NO GROWTH
Culture: NO GROWTH
Special Requests: ADEQUATE
Special Requests: ADEQUATE

## 2018-01-04 NOTE — Discharge Summary (Signed)
[facility] NAME: Vincent Poole    MR#:  [id number]  DATE OF BIRTH:  Jan 22, 1935  DATE OF ADMISSION:  12/26/2017 ADMITTING PHYSICIAN: Demetrios Loll, MD  DATE OF DISCHARGE: 12/30/2017  2:21 PM  PRIMARY CARE PHYSICIAN: [name], MD   ADMISSION DIAGNOSIS:  Sepsis, due to unspecified organism ([place]) [A41.9] Urinary tract infection without hematuria, site unspecified [N39.0]  DISCHARGE DIAGNOSIS:  Active Problems:   Sepsis ([place])   SECONDARY DIAGNOSIS:   Past Medical History:  Diagnosis Date  . Anxiety   . Arthritis   . Hypertension   . Prostate cancer ([place])   . Urinary retention 2019   foley catheter place 11/2017  . UTI (urinary tract infection) 2019   frequent UTI's over last year     ADMITTING HISTORY  HISTORY OF PRESENT ILLNESS:  Vincent Poole  is a 83 y.o. male with a known history of multiple medical problems as below.  The patient has chronic Foley catheter and recurrent UTI.  He came to the ED due to above chief complaints.  He complains of worsening pain from his penis.  He was found septic and started IV antibiotics.  CT of the abdomen shows New moderate bilateral hydronephrosis with hematoma or urothelial lesion. Dr. Burlene Arnt discussed with Dr. Erlene Quan, who will see the patient tomorrow morning.   HOSPITAL COURSE:   1 acute sepsis due to recurrent UTI Resolved S/pcystoscopy/bilateral ureteral stent placement/TURBT w/partial bladder cancer resection/TURP with prostate biopsy on December 27, 2017 by Dr. Brandon/urology,recovering well, cultures negative, to follow-up with urology status post discharge for pathology results, to continue Foley, discontinued Vanco/Zosyn, and started p.o. Cipro to complete antibiotic course for 3 days  2 AKI Resolved Most likely secondary to hydronephrosis  Continue to avoid nephrotoxic agents, Flomax, Proscar  3 acutemoderate bilateral hydronephrosiswithhematoma or  urothelial lesion Resolved S/p procedure as stated above Urology following as stated above   4generalized weakness  Physical therapy recommending home health PT status post discharge Home health set up at discharge  6 acute pain syndrome Secondary to above Pain medication prescription given  Patient discharged home in stable condition with home health.  CONSULTS OBTAINED:  Treatment Team:  [name], MD  DRUG ALLERGIES:   Allergies  Allergen Reactions  . Penicillins Other (See Comments)    Caused nervousness as a child. Patient states he took as a teenager w/o problems Has patient had a PCN reaction causing immediate rash, facial/tongue/throat swelling, SOB or lightheadedness with hypotension: No Has patient had a PCN reaction causing severe rash involving mucus membranes or skin necrosis: No Has patient had a PCN reaction that required hospitalization: No Has patient had a PCN reaction occurring within the last 10 years: No If all of the above answers are "NO", then may proceed with    DISCHARGE MEDICATIONS:   Allergies as of 12/30/2017      Reactions   Penicillins Other (See Comments)   Caused nervousness as a child. Patient states he took as a teenager w/o problems Has patient had a PCN reaction causing immediate rash, facial/tongue/throat swelling, SOB or lightheadedness with hypotension: No Has patient had a PCN reaction causing severe rash involving mucus membranes or skin necrosis: No Has patient had a PCN reaction that required hospitalization: No Has patient had a PCN reaction occurring within the last 10 years: No If all of the above answers are "NO", then may proceed with      Medication List  TAKE these medications   acetaminophen 500 MG tablet Commonly known as:  TYLENOL Take 1,000 mg by mouth 2 (two) times daily as needed for moderate pain or headache.   CRANBERRY PO Take 1 tablet by mouth 2 (two) times daily.   finasteride 5 MG  tablet Commonly known as:  PROSCAR Take 1 tablet (5 mg total) by mouth daily.   HYDROcodone-acetaminophen 5-325 MG tablet Commonly known as:  NORCO/VICODIN Take 1 tablet by mouth every 6 (six) hours as needed for severe pain.   nitrofurantoin (macrocrystal-monohydrate) 100 MG capsule Commonly known as:  MACROBID Take 1 capsule (100 mg total) by mouth daily for 15 days.   oxybutynin 5 MG tablet Commonly known as:  DITROPAN Take 1 tablet (5 mg total) by mouth 2 (two) times daily.   tamsulosin 0.4 MG Caps capsule Commonly known as:  FLOMAX Take 1 capsule (0.4 mg total) by mouth 2 (two) times daily.   trimethoprim 100 MG tablet Commonly known as:  TRIMPEX Take 1 tablet (100 mg total) by mouth daily.       Today   VITAL SIGNS:  Blood pressure (!) 101/40, pulse 96, temperature 99 F (37.2 C), temperature source Oral, resp. rate 16, height 5\' 11"  (1.803 m), weight 84.1 kg (185 lb 6.5 oz), SpO2 97 %.  I/O:  No intake or output data in the 24 hours ending 01/04/18 1335  PHYSICAL EXAMINATION:  Physical Exam  GENERAL:  83 y.o.-year-old patient lying in the bed with no acute distress.  LUNGS: Normal breath sounds bilaterally, no wheezing, rales,rhonchi or crepitation. No use of accessory muscles of respiration.  CARDIOVASCULAR: S1, S2 normal. No murmurs, rubs, or gallops.  ABDOMEN: Soft, non-tender, non-distended. Bowel sounds present. No organomegaly or mass.  NEUROLOGIC: Moves all 4 extremities. PSYCHIATRIC: The patient is alert and oriented x 3.  SKIN: No obvious rash, lesion, or ulcer.   DATA REVIEW:   CBC Recent Labs  Lab 12/30/17 0607  WBC 8.9  HGB 9.5*  HCT 29.0*  PLT 298    Chemistries  Recent Labs  Lab 12/29/17 0653  CREATININE 0.95    Cardiac Enzymes No results for input(s): TROPONINI in the last 168 hours.  Microbiology Results  Results for orders placed or performed during the hospital encounter of 12/26/17  Urine culture     Status: None    Collection Time: 12/26/17  4:08 PM  Result Value Ref Range Status   Specimen Description   Final    URINE, RANDOM Performed at [facility], [address], [place], [place] [address]    Special Requests   Final    NONE Performed at [facility], [address], [place], [place] [address]    Culture   Final    NO GROWTH Performed at [facility], [place] [address], [place], [place] [address]    Report Status 12/28/2017 FINAL  Final  Culture, blood (routine x 2)     Status: None   Collection Time: 12/26/17  4:08 PM  Result Value Ref Range Status   Specimen Description BLOOD LEFT WRIST  Final   Special Requests   Final    BOTTLES DRAWN AEROBIC AND ANAEROBIC Blood Culture adequate volume   Culture   Final    NO GROWTH 5 DAYS Performed at [facility], [address], [place], [place] [address]    Report Status 12/31/2017 FINAL  Final  Culture, blood (routine x 2)     Status: None  Collection Time: 12/26/17  4:08 PM  Result Value Ref Range Status   Specimen Description BLOOD RAC  Final   Special Requests   Final    BOTTLES DRAWN AEROBIC AND ANAEROBIC Blood Culture adequate volume   Culture   Final    NO GROWTH 5 DAYS Performed at [facility], [address], [place], [place] [address]    Report Status 12/31/2017 FINAL  Final  Wound or Superficial Culture     Status: None   Collection Time: 12/26/17  4:08 PM  Result Value Ref Range Status   Specimen Description   Final    PENIS Performed at [facility], [address], [place], [place] [address]    Special Requests   Final    NONE Performed at [facility], [address] [place], [place] [address]    Gram Stain   Final    MODERATE WBC PRESENT, PREDOMINANTLY PMN MODERATE GRAM NEGATIVE RODS MODERATE GRAM POSITIVE COCCI    Culture   Final    NORMAL SKIN FLORA Performed at [facility], [place] [address] [place], [place] [address]    Report Status  12/29/2017 FINAL  Final    RADIOLOGY:  No results found.  Follow up with PCP in 1 week.  Management plans discussed with the patient, family and they are in agreement.  CODE STATUS:  Code Status History    Date Active Date Inactive Code Status Order ID Comments User Context   12/26/2017 21:41 12/30/2017 17:27 Full Code [id number]  Demetrios Loll, MD Inpatient   05/31/2017 02:08 06/01/2017 19:22 Full Code [id number]  Harvie Bridge, DO Inpatient   03/04/2017 18:33 03/05/2017 14:26 Full Code [id number]  Idelle Crouch, MD Inpatient   07/11/2016 05:38 07/13/2016 14:20 Full Code [id number]  Saundra Shelling, MD ED      TOTAL TIME TAKING CARE OF THIS PATIENT ON DAY OF DISCHARGE: more than 30 minutes.   Leia Alf Kasiah Manka M.D on 01/04/2018 at 1:35 PM  Between 7am to 6pm - Pager - [phone]  After 6pm go to [email or web address] - password [email or web address] [facility]  Office  [phone]  CC: Primary care physician; [name], MD  Note: This dictation was prepared with Dragon dictation along with smaller phrase technology. Any transcriptional errors that result from this process are unintentional.

## 2018-01-05 ENCOUNTER — Encounter: Payer: Self-pay | Admitting: Urology

## 2018-01-05 ENCOUNTER — Ambulatory Visit (INDEPENDENT_AMBULATORY_CARE_PROVIDER_SITE_OTHER): Payer: Medicare Other | Admitting: Urology

## 2018-01-05 VITALS — BP 129/67 | HR 97 | Ht 71.0 in | Wt 185.0 lb

## 2018-01-05 DIAGNOSIS — C678 Malignant neoplasm of overlapping sites of bladder: Secondary | ICD-10-CM

## 2018-01-05 NOTE — Progress Notes (Signed)
01/05/2018 11:49 AM   [name] 1935-05-02 [id number]  Referring provider: [name], MD [place] [facility] [place], [place] [address]  Chief Complaint  Patient presents with  . Routine Post Op    HPI: 83 year old male with a history of recurrent UTIs, urinary retention, history of prostate cancer status post IM RT who returns today following recent TURBT to discuss his pathology results.  Unfortunately, it appears that he has T4 bladder cancer with extension involving the prostate and likely locally invasive disease beyond the bladder serosa based on imaging.  He has no lymphadenopathy.  Additional staging including CT chest is negative for evidence of metastatic disease.  He currently has a Foley catheter in place as well as bilateral indwelling stents for worsening bilateral hydronephrosis.  Since being discharged from the hospital, he is doing fairly well.  He is coming to terms with his diagnosis.  His functional status has been declining over the past few years.  He has difficulty getting around and relies on assistance for many of his activities of daily living at.  He is unable to shower himself or to prepare meals.  He has difficulty ambulating.  PMH: Past Medical History:  Diagnosis Date  . Anxiety   . Arthritis   . Hypertension   . Prostate cancer ([place])   . Urinary retention 2019   foley catheter place 11/2017  . UTI (urinary tract infection) 2019   frequent UTI's over last year    Surgical History: Past Surgical History:  Procedure Laterality Date  . CARPAL TUNNEL RELEASE Right   . CHOLECYSTECTOMY  2004  . CYSTOSCOPY W/ URETERAL STENT PLACEMENT Bilateral 12/27/2017   Procedure: CYSTOSCOPY WITH RETROGRADE PYELOGRAM/URETERAL STENT PLACEMENT;  Surgeon: [name], MD;  Location: [facility] ORS;  Service: Urology;  Laterality: Bilateral;  . LEG TENDON SURGERY Right 1958  . TRANSURETHRAL RESECTION OF BLADDER TUMOR N/A 12/27/2017    Procedure: TRANSURETHRAL RESECTION OF BLADDER TUMOR (TURBT);  Surgeon: [name], MD;  Location: [facility] ORS;  Service: Urology;  Laterality: N/A;    Home Medications:  Allergies as of 01/05/2018      Reactions   Penicillins Other (See Comments)   Caused nervousness as a child. Patient states he took as a teenager w/o problems Has patient had a PCN reaction causing immediate rash, facial/tongue/throat swelling, SOB or lightheadedness with hypotension: No Has patient had a PCN reaction causing severe rash involving mucus membranes or skin necrosis: No Has patient had a PCN reaction that required hospitalization: No Has patient had a PCN reaction occurring within the last 10 years: No If all of the above answers are "NO", then may proceed with      Medication List        Accurate as of 01/05/18 11:59 PM. Always use your most recent med list.          acetaminophen 500 MG tablet Commonly known as:  TYLENOL Take 1,000 mg by mouth 2 (two) times daily as needed for moderate pain or headache.   CRANBERRY PO Take 1 tablet by mouth 2 (two) times daily.   finasteride 5 MG tablet Commonly known as:  PROSCAR Take 1 tablet (5 mg total) by mouth daily.   HYDROcodone-acetaminophen 5-325 MG tablet Commonly known as:  NORCO/VICODIN Take 1 tablet by mouth every 6 (six) hours as needed for severe pain.   nitrofurantoin (macrocrystal-monohydrate) 100 MG capsule Commonly known as:  MACROBID Take 1 capsule (100 mg total) by mouth daily for  15 days.   oxybutynin 5 MG tablet Commonly known as:  DITROPAN Take 1 tablet (5 mg total) by mouth 2 (two) times daily.   tamsulosin 0.4 MG Caps capsule Commonly known as:  FLOMAX Take 1 capsule (0.4 mg total) by mouth 2 (two) times daily.   trimethoprim 100 MG tablet Commonly known as:  TRIMPEX Take 1 tablet (100 mg total) by mouth daily.       Allergies:  Allergies  Allergen Reactions  . Penicillins Other (See Comments)    Caused  nervousness as a child. Patient states he took as a teenager w/o problems Has patient had a PCN reaction causing immediate rash, facial/tongue/throat swelling, SOB or lightheadedness with hypotension: No Has patient had a PCN reaction causing severe rash involving mucus membranes or skin necrosis: No Has patient had a PCN reaction that required hospitalization: No Has patient had a PCN reaction occurring within the last 10 years: No If all of the above answers are "NO", then may proceed with    Family History: Family History  Problem Relation Age of Onset  . Cancer Mother   . Chronic Renal Failure Mother   . Heart disease Father     Social History:  reports that  has never smoked. he has never used smokeless tobacco. He reports that he does not drink alcohol or use drugs.  ROS: UROLOGY Frequent Urination?: No Hard to postpone urination?: No Burning/pain with urination?: No Get up at night to urinate?: No Leakage of urine?: No Urine stream starts and stops?: No Trouble starting stream?: No Do you have to strain to urinate?: No Blood in urine?: No Urinary tract infection?: No Sexually transmitted disease?: No Injury to kidneys or bladder?: No Painful intercourse?: No Weak stream?: No Erection problems?: No Penile pain?: No  Gastrointestinal Nausea?: No Vomiting?: No Indigestion/heartburn?: No Diarrhea?: No Constipation?: No  Constitutional Fever: No Night sweats?: No Weight loss?: No Fatigue?: No  Skin Skin rash/lesions?: No Itching?: No  Eyes Blurred vision?: No Double vision?: No  Ears/Nose/Throat Sore throat?: No Sinus problems?: No  Hematologic/Lymphatic Swollen glands?: No Easy bruising?: No  Cardiovascular Leg swelling?: No Chest pain?: No  Respiratory Cough?: No Shortness of breath?: No  Endocrine Excessive thirst?: No  Musculoskeletal Back pain?: No Joint pain?: No  Neurological Headaches?: No Dizziness?:  No  Psychologic Depression?: No Anxiety?: No  Physical Exam: BP 129/67   Pulse 97   Ht 5\' 11"  (1.803 m)   Wt 185 lb (83.9 kg)   BMI 25.80 kg/m   Constitutional:  Alert and oriented, No acute distress.  Accompanied by wife today.  Ambulating with walker.  Foley catheter in place draining pink tinged urine without debris.  Continues to be significantly malodorous. HEENT: [place] AT, moist mucus membranes.  Trachea midline, no masses. Cardiovascular: No clubbing, cyanosis, or edema. Respiratory: Normal respiratory effort, no increased work of breathing. Neurologic: Grossly intact, no focal deficits, moving all 4 extremities. Psychiatric: Normal mood and affect.  Laboratory Data: Lab Results  Component Value Date   WBC 8.9 12/30/2017   HGB 9.5 (L) 12/30/2017   HCT 29.0 (L) 12/30/2017   MCV 86.9 12/30/2017   PLT 298 12/30/2017    Lab Results  Component Value Date   CREATININE 0.95 12/29/2017   Urinalysis Lab Results  Component Value Date   SPECGRAV 1.015 11/28/2017   PHUR >9.0 (H) 11/28/2017   COLORU Red (A) 11/28/2017   APPEARANCEUR TURBID (A) 12/26/2017   LEUKOCYTESUR SMALL (A) 12/26/2017   PROTEINUR >=  300 (A) 12/26/2017   GLUCOSEU NEGATIVE 12/26/2017   KETONESU Trace (A) 11/28/2017   RBCU TOO NUMEROUS TO COUNT 12/26/2017   BILIRUBINUR MODERATE (A) 12/26/2017   UUROB 2.0 (H) 11/28/2017   NITRITE NEGATIVE 12/26/2017    Lab Results  Component Value Date   LABMICR See below: 11/28/2017   WBCUA >30 (H) 11/28/2017   RBCUA >30 (H) 11/28/2017   LABEPIT None seen 11/28/2017   MUCUS Present (A) 11/28/2017   BACTERIA NONE SEEN 12/26/2017    Pertinent Imaging: Results for orders placed during the hospital encounter of 12/26/17  CT Renal Stone Study   Narrative CLINICAL DATA:  Penile pain and decreased urinary output. History of chronic UTI.  EXAM: CT ABDOMEN AND PELVIS WITHOUT CONTRAST  TECHNIQUE: Multidetector CT imaging of the abdomen and pelvis was  performed following the standard protocol without IV contrast.  COMPARISON:  CT abdomen pelvis dated December 20, 2017.  FINDINGS: Lower chest: No acute abnormality. Bilateral gynecomastia. Three-vessel coronary artery atherosclerosis.  Hepatobiliary: No focal liver abnormality is seen. Status post cholecystectomy. No biliary dilatation.  Pancreas: Unremarkable. No pancreatic ductal dilatation or surrounding inflammatory changes.  Spleen: Normal in size without focal abnormality.  Adrenals/Urinary Tract: The adrenal glands are unremarkable. New moderate bilateral hydronephrosis. No renal or ureteral calculi. Foley catheter within the decompressed bladder. Persistent perivesicular inflammatory changes and diffuse bladder wall thickening, with unchanged intermediate density filling defect within the bladder.  Stomach/Bowel: Small hiatal hernia. The stomach is otherwise within normal limits. The appendix is normal. No bowel wall thickening, distention, or surrounding inflammatory changes.  Vascular/Lymphatic: Mild aortic atherosclerosis. No enlarged abdominal or pelvic lymph nodes.  Reproductive: The prostate gland remains enlarged.  Other: No free fluid or pneumoperitoneum.  Musculoskeletal: No acute or significant osseous findings. Severe degenerative changes of the bilateral hips. Severe osteopenia.  IMPRESSION: 1. New moderate bilateral hydronephrosis. Unchanged diffuse bladder wall thickening and perivesicular inflammatory changes with intermediate soft tissue density filling defect within the bladder concerning for hematoma or urothelial lesion. Correlation with cystoscopy is again advised. 2.  Aortic atherosclerosis (ICD10-I70.0).   Electronically Signed   By: Titus Dubin M.D.   On: 12/26/2017 17:55    Above CT scan was reviewed as well as CT of the chest from 10/27/2018.  This is also reviewed at tumor board with the radiologist.  Assessment & Plan:     1. Malignant neoplasm of overlapping sites of bladder [facility]) Surgical pathology as well as staging imaging was reviewed yesterday at tumor board with medical oncology, pathology, radiology, and radiation oncology all present. Newly diagnosed T4 bladder cancer without any evidence of lymphadenopathy or metastatic disease-locally advanced Given his age, functional status, and comorbidities, I do not feel that he is a surgical candidate for cystectomy We discussed tri-modal therapy at length today including chemotherapy likely in the form of gem/cis, surgical debulking with repeat TURBT, and radiation Given his history of prostate cancer and IMRT, the dose will be somewhat limited for radiation As such, we will plan to proceed with surgical debulking first followed radiation  Mr. Rosier understands the natural history of disease and that the goals of treatment are likely palliative better than curative All questions answered  - Ambulatory referral to Oncology  Schedule repeat TURBT (large).  Risk/ benefits reviwed again today in detail.  [name], MD  [facility] [address], [place], [place] [address] [phone]  I spent 40 min with this patient of which greater than 50% was  spent in counseling and coordination of care with the patient.

## 2018-01-05 NOTE — H&P (View-Only) (Signed)
01/05/2018 11:49 AM   [name] 1935/07/16 [id number]  Referring provider: [name], MD [facility] [place], [place] [address]  Chief Complaint  Patient presents with  . Routine Post Op    HPI: 83 year old male with a history of recurrent UTIs, urinary retention, history of prostate cancer status post IM RT who returns today following recent TURBT to discuss his pathology results.  Unfortunately, it appears that he has T4 bladder cancer with extension involving the prostate and likely locally invasive disease beyond the bladder serosa based on imaging.  He has no lymphadenopathy.  Additional staging including CT chest is negative for evidence of metastatic disease.  He currently has a Foley catheter in place as well as bilateral indwelling stents for worsening bilateral hydronephrosis.  Since being discharged from the hospital, he is doing fairly well.  He is coming to terms with his diagnosis.  His functional status has been declining over the past few years.  He has difficulty getting around and relies on assistance for many of his activities of daily living at.  He is unable to shower himself or to prepare meals.  He has difficulty ambulating.  PMH: Past Medical History:  Diagnosis Date  . Anxiety   . Arthritis   . Hypertension   . Prostate cancer ([place])   . Urinary retention 2019   foley catheter place 11/2017  . UTI (urinary tract infection) 2019   frequent UTI's over last year    Surgical History: Past Surgical History:  Procedure Laterality Date  . CARPAL TUNNEL RELEASE Right   . CHOLECYSTECTOMY  2004  . CYSTOSCOPY W/ URETERAL STENT PLACEMENT Bilateral 12/27/2017   Procedure: CYSTOSCOPY WITH RETROGRADE PYELOGRAM/URETERAL STENT PLACEMENT;  Surgeon: [name], MD;  Location: [facility] ORS;  Service: Urology;  Laterality: Bilateral;  . LEG TENDON SURGERY Right 1958  . TRANSURETHRAL RESECTION OF BLADDER TUMOR N/A 12/27/2017    Procedure: TRANSURETHRAL RESECTION OF BLADDER TUMOR (TURBT);  Surgeon: [name], MD;  Location: [facility] OR[facility];  Service: Urology;  Laterality: N/A;    Home Medications:  Allergies as of 01/05/2018      Reactions   Penicillins Other (See Comments)   Caused nervousness as a child. Patient states he took as a teenager w/o problems Has patient had a PCN reaction causing immediate rash, facial/tongue/throat swelling, SOB or lightheadedness with hypotension: No Has patient had a PCN reaction causing severe rash involving mucus membranes or skin necrosis: No Has patient had a PCN reaction that required hospitalization: No Has patient had a PCN reaction occurring within the last 10 years: No If all of the above answers are "NO", then may proceed with      Medication List        Accurate as of 01/05/18 11:59 PM. Always use your most recent med list.          acetaminophen 500 MG tablet Commonly known as:  TYLENOL Take 1,000 mg by mouth 2 (two) times daily as needed for moderate pain or headache.   CRANBERRY PO Take 1 tablet by mouth 2 (two) times daily.   finasteride 5 MG tablet Commonly known as:  PROSCAR Take 1 tablet (5 mg total) by mouth daily.   HYDROcodone-acetaminophen 5-325 MG tablet Commonly known as:  NORCO/VICODIN Take 1 tablet by mouth every 6 (six) hours as needed for severe pain.   nitrofurantoin (macrocrystal-monohydrate) 100 MG capsule Commonly known as:  MACROBID Take 1 capsule (100 mg total) by mouth daily for  15 days.   oxybutynin 5 MG tablet Commonly known as:  DITROPAN Take 1 tablet (5 mg total) by mouth 2 (two) times daily.   tamsulosin 0.4 MG Caps capsule Commonly known as:  FLOMAX Take 1 capsule (0.4 mg total) by mouth 2 (two) times daily.   trimethoprim 100 MG tablet Commonly known as:  TRIMPEX Take 1 tablet (100 mg total) by mouth daily.       Allergies:  Allergies  Allergen Reactions  . Penicillins Other (See Comments)    Caused  nervousness as a child. Patient states he took as a teenager w/o problems Has patient had a PCN reaction causing immediate rash, facial/tongue/throat swelling, SOB or lightheadedness with hypotension: No Has patient had a PCN reaction causing severe rash involving mucus membranes or skin necrosis: No Has patient had a PCN reaction that required hospitalization: No Has patient had a PCN reaction occurring within the last 10 years: No If all of the above answers are "NO", then may proceed with    Family History: Family History  Problem Relation Age of Onset  . Cancer Mother   . Chronic Renal Failure Mother   . Heart disease Father     Social History:  reports that  has never smoked. he has never used smokeless tobacco. He reports that he does not drink alcohol or use drugs.  ROS: UROLOGY Frequent Urination?: No Hard to postpone urination?: No Burning/pain with urination?: No Get up at night to urinate?: No Leakage of urine?: No Urine stream starts and stops?: No Trouble starting stream?: No Do you have to strain to urinate?: No Blood in urine?: No Urinary tract infection?: No Sexually transmitted disease?: No Injury to kidneys or bladder?: No Painful intercourse?: No Weak stream?: No Erection problems?: No Penile pain?: No  Gastrointestinal Nausea?: No Vomiting?: No Indigestion/heartburn?: No Diarrhea?: No Constipation?: No  Constitutional Fever: No Night sweats?: No Weight loss?: No Fatigue?: No  Skin Skin rash/lesions?: No Itching?: No  Eyes Blurred vision?: No Double vision?: No  Ears/Nose/Throat Sore throat?: No Sinus problems?: No  Hematologic/Lymphatic Swollen glands?: No Easy bruising?: No  Cardiovascular Leg swelling?: No Chest pain?: No  Respiratory Cough?: No Shortness of breath?: No  Endocrine Excessive thirst?: No  Musculoskeletal Back pain?: No Joint pain?: No  Neurological Headaches?: No Dizziness?:  No  Psychologic Depression?: No Anxiety?: No  Physical Exam: BP 129/67   Pulse 97   Ht 5\' 11"  (1.803 m)   Wt 185 lb (83.9 kg)   BMI 25.80 kg/m   Constitutional:  Alert and oriented, No acute distress.  Accompanied by wife today.  Ambulating with walker.  Foley catheter in place draining pink tinged urine without debris.  Continues to be significantly malodorous. HEENT: [place] AT, moist mucus membranes.  Trachea midline, no masses. Cardiovascular: No clubbing, cyanosis, or edema. Respiratory: Normal respiratory effort, no increased work of breathing. Neurologic: Grossly intact, no focal deficits, moving all 4 extremities. Psychiatric: Normal mood and affect.  Laboratory Data: Lab Results  Component Value Date   WBC 8.9 12/30/2017   HGB 9.5 (L) 12/30/2017   HCT 29.0 (L) 12/30/2017   MCV 86.9 12/30/2017   PLT 298 12/30/2017    Lab Results  Component Value Date   CREATININE 0.95 12/29/2017   Urinalysis Lab Results  Component Value Date   SPECGRAV 1.015 11/28/2017   PHUR >9.0 (H) 11/28/2017   COLORU Red (A) 11/28/2017   APPEARANCEUR TURBID (A) 12/26/2017   LEUKOCYTESUR SMALL (A) 12/26/2017   PROTEINUR >=  300 (A) 12/26/2017   GLUCOSEU NEGATIVE 12/26/2017   KETONESU Trace (A) 11/28/2017   RBCU TOO NUMEROUS TO COUNT 12/26/2017   BILIRUBINUR MODERATE (A) 12/26/2017   UUROB 2.0 (H) 11/28/2017   NITRITE NEGATIVE 12/26/2017    Lab Results  Component Value Date   LABMICR See below: 11/28/2017   WBCUA >30 (H) 11/28/2017   RBCUA >30 (H) 11/28/2017   LABEPIT None seen 11/28/2017   MUCUS Present (A) 11/28/2017   BACTERIA NONE SEEN 12/26/2017    Pertinent Imaging: Results for orders placed during the hospital encounter of 12/26/17  CT Renal Stone Study   Narrative CLINICAL DATA:  Penile pain and decreased urinary output. History of chronic UTI.  EXAM: CT ABDOMEN AND PELVIS WITHOUT CONTRAST  TECHNIQUE: Multidetector CT imaging of the abdomen and pelvis was  performed following the standard protocol without IV contrast.  COMPARISON:  CT abdomen pelvis dated December 20, 2017.  FINDINGS: Lower chest: No acute abnormality. Bilateral gynecomastia. Three-vessel coronary artery atherosclerosis.  Hepatobiliary: No focal liver abnormality is seen. Status post cholecystectomy. No biliary dilatation.  Pancreas: Unremarkable. No pancreatic ductal dilatation or surrounding inflammatory changes.  Spleen: Normal in size without focal abnormality.  Adrenals/Urinary Tract: The adrenal glands are unremarkable. New moderate bilateral hydronephrosis. No renal or ureteral calculi. Foley catheter within the decompressed bladder. Persistent perivesicular inflammatory changes and diffuse bladder wall thickening, with unchanged intermediate density filling defect within the bladder.  Stomach/Bowel: Small hiatal hernia. The stomach is otherwise within normal limits. The appendix is normal. No bowel wall thickening, distention, or surrounding inflammatory changes.  Vascular/Lymphatic: Mild aortic atherosclerosis. No enlarged abdominal or pelvic lymph nodes.  Reproductive: The prostate gland remains enlarged.  Other: No free fluid or pneumoperitoneum.  Musculoskeletal: No acute or significant osseous findings. Severe degenerative changes of the bilateral hips. Severe osteopenia.  IMPRESSION: 1. New moderate bilateral hydronephrosis. Unchanged diffuse bladder wall thickening and perivesicular inflammatory changes with intermediate soft tissue density filling defect within the bladder concerning for hematoma or urothelial lesion. Correlation with cystoscopy is again advised. 2.  Aortic atherosclerosis (ICD10-I70.0).   Electronically Signed   By: Titus Dubin M.D.   On: 12/26/2017 17:55    Above CT scan was reviewed as well as CT of the chest from 10/27/2018.  This is also reviewed at tumor board with the radiologist.  Assessment & Plan:     1. Malignant neoplasm of overlapping sites of bladder [facility]) Surgical pathology as well as staging imaging was reviewed yesterday at tumor board with medical oncology, pathology, radiology, and radiation oncology all present. Newly diagnosed T4 bladder cancer without any evidence of lymphadenopathy or metastatic disease-locally advanced Given his age, functional status, and comorbidities, I do not feel that he is a surgical candidate for cystectomy We discussed tri-modal therapy at length today including chemotherapy likely in the form of gem/cis, surgical debulking with repeat TURBT, and radiation Given his history of prostate cancer and IMRT, the dose will be somewhat limited for radiation As such, we will plan to proceed with surgical debulking first followed radiation  Mr. Sangalang understands the natural history of disease and that the goals of treatment are likely palliative better than curative All questions answered  - Ambulatory referral to Oncology  Schedule repeat TURBT (large).  Risk/ benefits reviwed again today in detail.  [name], MD  [facility] [address], [place], [place] [address] [phone]  I spent 40 min with this patient of which greater than 50% was  spent in counseling and coordination of care with the patient.

## 2018-01-07 ENCOUNTER — Encounter: Payer: Self-pay | Admitting: Urology

## 2018-01-08 ENCOUNTER — Other Ambulatory Visit: Payer: Self-pay | Admitting: Radiology

## 2018-01-08 DIAGNOSIS — C678 Malignant neoplasm of overlapping sites of bladder: Secondary | ICD-10-CM

## 2018-01-09 ENCOUNTER — Inpatient Hospital Stay: Payer: Medicare Other | Admitting: Oncology

## 2018-01-12 ENCOUNTER — Other Ambulatory Visit: Payer: Self-pay

## 2018-01-12 ENCOUNTER — Inpatient Hospital Stay: Payer: Medicare Other | Attending: Oncology | Admitting: Oncology

## 2018-01-12 ENCOUNTER — Ambulatory Visit
Admission: RE | Admit: 2018-01-12 | Discharge: 2018-01-12 | Disposition: A | Discharge status: Home / Self Care | Payer: Medicare Other | Source: Ambulatory Visit | Attending: Radiation Oncology | Admitting: Radiation Oncology

## 2018-01-12 ENCOUNTER — Encounter: Payer: Self-pay | Admitting: Oncology

## 2018-01-12 VITALS — BP 122/84 | HR 116 | Temp 97.8°F | Resp 24 | Ht 71.0 in | Wt 189.0 lb

## 2018-01-12 DIAGNOSIS — M8580 Other specified disorders of bone density and structure, unspecified site: Secondary | ICD-10-CM | POA: Diagnosis not present

## 2018-01-12 DIAGNOSIS — R5381 Other malaise: Secondary | ICD-10-CM | POA: Diagnosis not present

## 2018-01-12 DIAGNOSIS — R5383 Other fatigue: Secondary | ICD-10-CM | POA: Insufficient documentation

## 2018-01-12 DIAGNOSIS — N1330 Unspecified hydronephrosis: Secondary | ICD-10-CM | POA: Insufficient documentation

## 2018-01-12 DIAGNOSIS — Z809 Family history of malignant neoplasm, unspecified: Secondary | ICD-10-CM | POA: Diagnosis not present

## 2018-01-12 DIAGNOSIS — F419 Anxiety disorder, unspecified: Secondary | ICD-10-CM | POA: Insufficient documentation

## 2018-01-12 DIAGNOSIS — Z87440 Personal history of urinary (tract) infections: Secondary | ICD-10-CM | POA: Diagnosis not present

## 2018-01-12 DIAGNOSIS — Z8546 Personal history of malignant neoplasm of prostate: Secondary | ICD-10-CM | POA: Diagnosis not present

## 2018-01-12 DIAGNOSIS — C679 Malignant neoplasm of bladder, unspecified: Secondary | ICD-10-CM | POA: Diagnosis present

## 2018-01-12 DIAGNOSIS — Z5111 Encounter for antineoplastic chemotherapy: Secondary | ICD-10-CM | POA: Diagnosis not present

## 2018-01-12 DIAGNOSIS — Z880 Allergy status to penicillin: Secondary | ICD-10-CM | POA: Diagnosis not present

## 2018-01-12 DIAGNOSIS — M1990 Unspecified osteoarthritis, unspecified site: Secondary | ICD-10-CM | POA: Diagnosis not present

## 2018-01-12 DIAGNOSIS — I10 Essential (primary) hypertension: Secondary | ICD-10-CM | POA: Insufficient documentation

## 2018-01-12 DIAGNOSIS — Z923 Personal history of irradiation: Secondary | ICD-10-CM | POA: Insufficient documentation

## 2018-01-12 DIAGNOSIS — Z79899 Other long term (current) drug therapy: Secondary | ICD-10-CM | POA: Insufficient documentation

## 2018-01-12 DIAGNOSIS — K5900 Constipation, unspecified: Secondary | ICD-10-CM | POA: Insufficient documentation

## 2018-01-12 DIAGNOSIS — Z7189 Other specified counseling: Secondary | ICD-10-CM

## 2018-01-12 DIAGNOSIS — I700 Atherosclerosis of aorta: Secondary | ICD-10-CM | POA: Insufficient documentation

## 2018-01-12 MED ORDER — LORAZEPAM 0.5 MG PO TABS: 0.5000 mg | ORAL_TABLET | Freq: Four times a day (QID) | ORAL | 0 refills | Status: DC | PRN

## 2018-01-12 MED ORDER — LIDOCAINE-PRILOCAINE 2.5-2.5 % EX CREA: TOPICAL_CREAM | CUTANEOUS | 3 refills | Status: DC

## 2018-01-12 MED ORDER — ONDANSETRON HCL 8 MG PO TABS: 8.0000 mg | ORAL_TABLET | Freq: Two times a day (BID) | ORAL | 1 refills | Status: DC | PRN

## 2018-01-12 MED ORDER — LIDOCAINE-PRILOCAINE 2.5-2.5 % EX CREA: TOPICAL_CREAM | CUTANEOUS | 1 refills | Status: DC

## 2018-01-12 MED ORDER — PROCHLORPERAZINE MALEATE 10 MG PO TABS: 10.0000 mg | ORAL_TABLET | Freq: Four times a day (QID) | ORAL | 1 refills | Status: DC | PRN

## 2018-01-12 MED ORDER — DEXAMETHASONE 4 MG PO TABS: 8.0000 mg | ORAL_TABLET | Freq: Every day | ORAL | 1 refills | Status: DC

## 2018-01-12 NOTE — Progress Notes (Addendum)
Hematology/Oncology Consult note [facility] [phone] [phone]  Patient Care Team: [name], MD as PCP - General (Family Medicine) Clent Jacks, RN as Registered Nurse   Name of the patient: Vincent Poole  [id number]  04/06/1935    Reason for referral- new diagnosis of muscle invasive bladder cancer   Referring physician- Dr. [name]  Date of visit: 01/12/18   History of presenting illness-patient is a 83 year old male with a past medical history significant for prostate cancer, recurrent UTIs and urinary retention.  For his prostate cancer he has received IM RT in the past.  He has been seeing Dr. Erlene Quan in the past for his recurrent UTIs as well as hematuria.  CT scan in July 2018 showed bladder wall thickening with perivascular edema and inflammation in the right ureter greater than the left.  Findings were thought to be due to pyelonephritis at that time.  He underwent cystoscopy on 12/14/2017 which showed abnormal looking prostate with necrotic material lining the entire surface area.  Diffuse copious debris within the bladder appearing to be erythematous without discrete bladder tumor but visualization was poor.  He was then admitted to the hospital on 12/26/2017 with symptoms of UTI and sepsis as well as new moderate bilateral hydronephrosis.  CT abdomen and pelvis with contrast on 12/20/2017 again showed market bile bladder wall thickening with perivesicular edema.  Within the lumen of the bladder there is a 93.9 cm soft tissue attenuating filling defect.  This is indeterminate and could represent an area of blood clot versus urothelial lesion.  He underwent repeat cystoscopy with bilateral pyelogram and ureteral stent placement as well as TURBT and TURP.  He was found to have a massive tumor involving the majority of the bladder with grossly necrotic and calcified material.  There appeared to be multifocal disease  with large burden of the left anterior bladder wall extending posteriorly as well as adjacent to the bladder neck and beyond the right hemitrigone.  Very little normal recognizable bladder mucosa remaining.    Biopsy from TURBT and TURP showed: High-grade urothelial carcinoma with extensive necrosis involving both the bladder and the prostate.  CT chest did not reveal any evidence of metastatic disease.  He was also not found to have any regional adenopathy on CT abdomen  Patient was seen by Dr. Erlene Quan and was not deemed to be a surgical candidate.  He has been referred to [place] for definitive treatment options.  Patient lives with his wife at home and ambulates with a cane.  He does need assistance with his ADLs to some extent.  He has not had any falls and he denies any changes in his appetite or unintentional weight loss.  Denies any pain.  Reports some fatigue and occasional problems with constipation.  He has had 3 hospitalizations last year for urinary tract infections and currently has a chronic Foley for the last 1 month   ECOG PS- 2  Pain scale- 0   Review of systems- Review of Systems  Constitutional: Positive for malaise/fatigue. Negative for chills, fever and weight loss.  HENT: Negative for congestion, ear discharge and nosebleeds.   Eyes: Negative for blurred vision.  Respiratory: Negative for cough, hemoptysis, sputum production, shortness of breath and wheezing.   Cardiovascular: Negative for chest pain, palpitations, orthopnea and claudication.  Gastrointestinal: Negative for abdominal pain, blood in stool, constipation, diarrhea, heartburn, melena, nausea and vomiting.  Genitourinary: Negative for dysuria, flank pain, frequency, hematuria and urgency.  Musculoskeletal: Negative for back pain, joint pain and myalgias.  Skin: Negative for rash.  Neurological: Negative for dizziness, tingling, focal weakness, seizures, weakness and headaches.  Endo/Heme/Allergies: Does not  bruise/bleed easily.  Psychiatric/Behavioral: Negative for depression and suicidal ideas. The patient does not have insomnia.     Allergies  Allergen Reactions  . Penicillins Other (See Comments)    Caused nervousness as a child. Patient states he took as a teenager w/o problems Has patient had a PCN reaction causing immediate rash, facial/tongue/throat swelling, SOB or lightheadedness with hypotension: No Has patient had a PCN reaction causing severe rash involving mucus membranes or skin necrosis: No Has patient had a PCN reaction that required hospitalization: No Has patient had a PCN reaction occurring within the last 10 years: No If all of the above answers are "NO", then may proceed with    Patient Active Problem List   Diagnosis Date Noted  . Pyelonephritis 05/31/2017  . UTI (urinary tract infection) 03/04/2017  . Urinary obstruction   . Essential hypertension 08/30/2016  . History of shingles 08/30/2016  . Prostate cancer ([place]) 08/30/2016  . Urinary retention 08/30/2016  . Medicare annual wellness visit, initial 08/01/2016  . Medicare annual wellness visit, subsequent 08/01/2016  . Sepsis ([place]) 07/11/2016  . Borderline diabetes mellitus 01/26/2016  . Vaccine counseling 01/26/2015  . Lumbar radiculitis 06/24/2014  . OA (osteoarthritis) of hip 06/24/2014  . Malignant neoplasm of prostate ([place]) 01/27/2011     Past Medical History:  Diagnosis Date  . Anxiety   . Arthritis   . Hypertension   . Prostate cancer ([place])   . Urinary retention 2019   foley catheter place 11/2017  . UTI (urinary tract infection) 2019   frequent UTI's over last year     Past Surgical History:  Procedure Laterality Date  . CARPAL TUNNEL RELEASE Right   . CHOLECYSTECTOMY  2004  . CYSTOSCOPY W/ URETERAL STENT PLACEMENT Bilateral 12/27/2017   Procedure: CYSTOSCOPY WITH RETROGRADE PYELOGRAM/URETERAL STENT PLACEMENT;  Surgeon: [name], MD;  Location: [facility] ORS;  Service: Urology;   Laterality: Bilateral;  . LEG TENDON SURGERY Right 1958  . TRANSURETHRAL RESECTION OF BLADDER TUMOR N/A 12/27/2017   Procedure: TRANSURETHRAL RESECTION OF BLADDER TUMOR (TURBT);  Surgeon: [name], MD;  Location: [facility] ORS;  Service: Urology;  Laterality: N/A;    Social History   Socioeconomic History  . Marital status: Married    Spouse name: Not on file  . Number of children: Not on file  . Years of education: Not on file  . Highest education level: Not on file  Social Needs  . Financial resource strain: Not on file  . Food insecurity - worry: Not on file  . Food insecurity - inability: Not on file  . Transportation needs - medical: Not on file  . Transportation needs - non-medical: Not on file  Occupational History  . Occupation: retired  Tobacco Use  . Smoking status: Never Smoker  . Smokeless tobacco: Never Used  Substance and Sexual Activity  . Alcohol use: No    Alcohol/week: 0.0 oz  . Drug use: No  . Sexual activity: No  Other Topics Concern  . Not on file  Social History Narrative  . Not on file     Family History  Problem Relation Age of Onset  . Cancer Mother   . Chronic Renal Failure Mother   . Heart disease Father      Current Outpatient Medications:  .  acetaminophen (TYLENOL) 500  MG tablet, Take 1,000 mg by mouth 2 (two) times daily as needed for moderate pain or headache. , Disp: , Rfl:  .  CRANBERRY PO, Take 1 tablet by mouth 2 (two) times daily., Disp: , Rfl:  .  finasteride (PROSCAR) 5 MG tablet, Take 1 tablet (5 mg total) by mouth daily., Disp: 90 tablet, Rfl: 3 .  HYDROcodone-acetaminophen (NORCO/VICODIN) 5-325 MG tablet, Take 1 tablet by mouth every 6 (six) hours as needed for severe pain., Disp: 20 tablet, Rfl: 0 .  nitrofurantoin, macrocrystal-monohydrate, (MACROBID) 100 MG capsule, Take 1 capsule (100 mg total) by mouth daily for 15 days., Disp: 15 capsule, Rfl: 0 .  oxybutynin (DITROPAN) 5 MG tablet, Take 1 tablet (5 mg total) by mouth  2 (two) times daily., Disp: 60 tablet, Rfl: 0 .  tamsulosin (FLOMAX) 0.4 MG CAPS capsule, Take 1 capsule (0.4 mg total) by mouth 2 (two) times daily., Disp: 60 capsule, Rfl: 11   Physical exam:  Vitals:   01/12/18 0822  BP: 122/84  Pulse: (!) 116  Resp: (!) 24  Temp: 97.8 F (36.6 C)  TempSrc: Tympanic  SpO2: 98%  Weight: 189 lb (85.7 kg)  Height: 5\' 11"  (1.803 m)   Physical Exam  Constitutional: He is oriented to person, place, and time.  Elderly frail gentleman who does not appear to be in any acute distress.  Ambulates with a cane  HENT:  Head: Normocephalic and atraumatic.  Eyes: EOM are normal. Pupils are equal, round, and reactive to light.  Neck: Normal range of motion.  Cardiovascular: Normal rate, regular rhythm and normal heart sounds.  Pulmonary/Chest: Effort normal and breath sounds normal.  Abdominal: Soft. Bowel sounds are normal.  Chronic Foley in place draining clear urine  Neurological: He is alert and oriented to person, place, and time.  Skin: Skin is warm and dry.       CMP Latest Ref Rng & Units 12/29/2017  Glucose 65 - 99 mg/dL -  BUN 6 - 20 mg/dL -  Creatinine 0.61 - 1.24 mg/dL 0.95  Sodium 135 - 145 mmol/L -  Potassium 3.5 - 5.1 mmol/L -  Chloride 101 - 111 mmol/L -  CO2 22 - 32 mmol/L -  Calcium 8.9 - 10.3 mg/dL -  Total Protein 6.5 - 8.1 g/dL -  Total Bilirubin 0.3 - 1.2 mg/dL -  Alkaline Phos 38 - 126 U/L -  AST 15 - 41 U/L -  ALT 17 - 63 U/L -   CBC Latest Ref Rng & Units 12/30/2017  WBC 3.8 - 10.6 K/uL 8.9  Hemoglobin 13.0 - 18.0 g/dL 9.5(L)  Hematocrit 40.0 - 52.0 % 29.0(L)  Platelets 150 - 440 K/uL 298    No images are attached to the encounter.  Ct Chest W Contrast  Result Date: 12/28/2017 CLINICAL DATA:  Bladder cancer.  Staging.  Asymptomatic. EXAM: CT CHEST WITH CONTRAST TECHNIQUE: Multidetector CT imaging of the chest was performed during intravenous contrast administration. CONTRAST:  64mL ISOVUE-300 IOPAMIDOL (ISOVUE-300)  INJECTION 61% COMPARISON:  Abdominopelvic CT of 12/26/2017. No prior chest imaging. FINDINGS: Cardiovascular: Advanced aortic and branch vessel atherosclerosis. Normal heart size, without pericardial effusion. Multivessel coronary artery atherosclerosis. No central pulmonary embolism, on this non-dedicated study. Mediastinum/Nodes: No supraclavicular adenopathy. No mediastinal or hilar adenopathy. Tiny hiatal hernia. Subtle esophageal fluid level, including on image 116/2 Lungs/Pleura: Trace bilateral pleural thickening and fluid. Probable secretions in the dependent lower trachea. Bilateral lower lobe and dependent upper lobe subsegmental atelectasis. Minimal left major fissure  thickening. No suspicious pulmonary nodule or mass. Bilateral pleural calcifications, including on image 74/2. Upper Abdomen: Cholecystectomy. Normal imaged portions of the liver, spleen, stomach, pancreas. Bilateral adrenal thickening. Musculoskeletal: Mild bilateral gynecomastia. Advanced degenerate changes of both glenohumeral joints. Moderate diffuse idiopathic skeletal hyperostosis. IMPRESSION: 1.  No acute process or evidence of metastatic disease in the chest. 2. Bilateral pleural calcifications, suggesting asbestos related pleural disease. 3. Tiny hiatal hernia. Esophageal air fluid level suggests dysmotility or gastroesophageal reflux. 4. Coronary artery atherosclerosis. Aortic Atherosclerosis (ICD10-I70.0). 5. Gynecomastia. Electronically Signed   By: Abigail Miyamoto M.D.   On: 12/28/2017 14:44   Ct Abdomen Pelvis W Contrast  Result Date: 12/20/2017 CLINICAL DATA:  Gross hematuria.  Prostate gland enlargement. EXAM: CT ABDOMEN AND PELVIS WITH CONTRAST TECHNIQUE: Multidetector CT imaging of the abdomen and pelvis was performed using the standard protocol following bolus administration of intravenous contrast. CONTRAST:  183mL ISOVUE-300 IOPAMIDOL (ISOVUE-300) INJECTION 61% COMPARISON:  05/30/2017. FINDINGS: Lower chest: No acute  abnormality. Atherosclerotic calcifications noted within the RCA, LAD and left circumflex coronary arteries. Hepatobiliary: No focal liver abnormality. Previous cholecystectomy. No biliary dilatation. Pancreas: Unremarkable. No pancreatic ductal dilatation or surrounding inflammatory changes. Spleen: Normal in size without focal abnormality. Adrenals/Urinary Tract: Normal adrenal glands. Bilateral kidney cysts. Within the inferior pole of the left kidney there is a low-attenuation structure measuring 6 mm which is too small to reliably characterize. Mild bilateral pelvocaliectasis. Left-sided hydroureter noted. The urinary bladder is collapsed around a Foley catheter. Diffuse bladder wall thickening and perivesicular edema is identified. Soft tissue attenuating filling defect within the urinary bladder measures 3.9 cm, image 85 of series 2. Stomach/Bowel: Small hiatal hernia. Stomach otherwise unremarkable. The small bowel loops have a normal course and caliber. The appendix is visualized and appears normal. Unremarkable appearance of the proximal colon. Scattered distal colonic diverticula identified. Vascular/Lymphatic: Aortic atherosclerosis. No aneurysm. No upper abdominal adenopathy. No pelvic or inguinal adenopathy. Reproductive: The prostate gland appears edematous measuring 5.3 by 5.4 by 5.3 cm (volume = 79 cm^3). Other: No free fluid or fluid collections identified. Musculoskeletal: Degenerative joint disease identified involving both hips. There is multi level disc space narrowing and ventral endplate spurring noted. IMPRESSION: 1. Again noted is marked bladder wall thickening with perivesicular edema. Within the lumen of the bladder there is a 3.9 cm soft tissue attenuating filling defect. This is indeterminate and could represent an area of blood clot versus urothelial lesion. Correlation with cystoscopy. 2. Prostate gland edema and enlargement. 3. Aortic Atherosclerosis (ICD10-I70.0). Three vessel  coronary artery atherosclerotic calcifications identified. 4. Small hiatal hernia. Electronically Signed   By: Kerby Moors M.D.   On: 12/20/2017 12:05   Ct Renal Stone Study  Result Date: 12/26/2017 CLINICAL DATA:  Penile pain and decreased urinary output. History of chronic UTI. EXAM: CT ABDOMEN AND PELVIS WITHOUT CONTRAST TECHNIQUE: Multidetector CT imaging of the abdomen and pelvis was performed following the standard protocol without IV contrast. COMPARISON:  CT abdomen pelvis dated December 20, 2017. FINDINGS: Lower chest: No acute abnormality. Bilateral gynecomastia. Three-vessel coronary artery atherosclerosis. Hepatobiliary: No focal liver abnormality is seen. Status post cholecystectomy. No biliary dilatation. Pancreas: Unremarkable. No pancreatic ductal dilatation or surrounding inflammatory changes. Spleen: Normal in size without focal abnormality. Adrenals/Urinary Tract: The adrenal glands are unremarkable. New moderate bilateral hydronephrosis. No renal or ureteral calculi. Foley catheter within the decompressed bladder. Persistent perivesicular inflammatory changes and diffuse bladder wall thickening, with unchanged intermediate density filling defect within the bladder. Stomach/Bowel: Small hiatal hernia. The  stomach is otherwise within normal limits. The appendix is normal. No bowel wall thickening, distention, or surrounding inflammatory changes. Vascular/Lymphatic: Mild aortic atherosclerosis. No enlarged abdominal or pelvic lymph nodes. Reproductive: The prostate gland remains enlarged. Other: No free fluid or pneumoperitoneum. Musculoskeletal: No acute or significant osseous findings. Severe degenerative changes of the bilateral hips. Severe osteopenia. IMPRESSION: 1. New moderate bilateral hydronephrosis. Unchanged diffuse bladder wall thickening and perivesicular inflammatory changes with intermediate soft tissue density filling defect within the bladder concerning for hematoma or  urothelial lesion. Correlation with cystoscopy is again advised. 2.  Aortic atherosclerosis (ICD10-I70.0). Electronically Signed   By: Titus Dubin M.D.   On: 12/26/2017 17:55    Assessment and plan- Patient is a 83 y.o. 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  I have also spoken to Dr. Erlene Quan and Dr. Donella Stade this morning to coordinate patient's care.  Overall this patient is elderly and frail and has a large muscle invasive high-grade urothelial carcinoma involving his prostate and he is not a surgical candidate for a radical cystectomy.  Dr. Erlene Quan plans to do a debulking procedure given the extent of the mass and debride noted on cystoscopy.  This procedure will be done on 01/15/2018.  Given that he has had prior IMRT for his prostate cancer Dr. Donella Stade informs me that he will be limited in the amount of radiation that he can give which would likely be for 4 weeks.  I am concerned that concurrent chemoradiation only for 4 weeks would not be sufficient to control his cancer.  I will therefore plan to give him 1 cycle of carboplatin and gemcitabine on 01/23/2018.  Carboplatin will be given at AUC 2 along with gemcitabine 800 mg/m IV 2 weeks on and one week off.  Dr. Donella Stade will tentatively start radiation on 02/12/2018 after 1 cycle of carbo/gem.  If radiation is planned weekly, I will plan to do weekly carboplatin AUC 2 along with his radiation and hold gemcitabine at that time.  I will restart carboplatin and gemcitabine for 2 more cycles after completion of his radiation treatment.  We will plan to get repeat scans after that.  Discussed risks and benefits of chemotherapy including all but not limited to fatigue, nausea, vomiting, risk of low blood counts and infections as well as transfusion.  Patient understands and agrees to  proceed.  We will plan to get port placement next week followed by chemo teach and tentatively I will see him on 01/23/2018 with CBC CMP for cycle 1 of carboplatin and gemcitabine.  Treatment will be given with curative intent   Total face to face encounter time for this patient visit was 45 min. >50% of the time was  spent in counseling and coordination of care.   Cancer Staging Malignant neoplasm of urinary bladder [facility]) Staging form: Urinary Bladder, AJCC 8th Edition - Clinical stage from 01/12/2018: Stage IIIA (cT4a, cN0, cM0) - Signed by Sindy Guadeloupe, MD on 01/12/2018   Thank you for this kind referral and the opportunity to participate in the care of this patient   Visit Diagnosis 1. Malignant neoplasm of urinary bladder, unspecified site ([place])   2. Goals of care, counseling/discussion     Dr. Randa Evens, MD, MPH [facility] at [facility] Pager- [phone] 01/12/2018 9:26 AM     Addendum: Given prior history of  I am RT to the prostate and surrounding lymph nodes, Dr. Donella Stade will decide after CT simulation if he can give radiation to his bladder and prostate at all.  If there is no plan to give radiation I will continue carboplatin gemcitabine 2 weeks on 1 week of for 4-6 cycles  Dr. Randa Evens, MD, MPH [facility] at [facility] Pager- [phone] 01/12/2018 10:49 AM

## 2018-01-12 NOTE — Consult Note (Signed)
NEW PATIENT EVALUATION  Name: Vincent Poole  MRN: [id number]  Date:   01/12/2018     DOB: 02-01-1935   This 83 y.o. 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  REFERRING PHYSICIAN: [name], MD  CHIEF COMPLAINT: No chief complaint on file.   DIAGNOSIS: The encounter diagnosis was Malignant neoplasm of urinary bladder, unspecified site [facility]).   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 His PSAs have been in the undetectable range. He is recently saw had recurrent UTIs and urinary retention and was seen by urology where CT scan performed July 2018 show bladder wall thickening. Inferior seventh he underwent a cystoscopy by Dr. Erlene Quan showing abnormal looking prostate with necrotic material lining the surface and necrotic debris in the bladder. He developed moderate bilateral hydronephrosis. Repeat CT scan in February showed a bladder mass with filling defect. He underwent repeat cystoscopy with ureteral stents placed as well as TURBT and TURP. He was found to have tumor involving majority of the bladder as well as the prostate with pathology showing high-grade urothelial carcinoma with extensive necrosis involving again both the bladder and prostate. He was not seen on CT scan to have any regional adenopathy or disease above the diaphragm. He was presented at our weekly tumor conference not thought to be surgical candidate based on his advanced age and overall medical condition. He is been seen by medical oncology and is  slated to start platinum chemotherapy with gemcitabine. I been asked to evaluate him for any possibility of further radiation therapy. He is currently has an indwelling Foley catheter.  PLANNED TREATMENT REGIMEN: Possible radiation therapy to his bladder  PAST MEDICAL HISTORY:  has a past medical history of Anxiety, Arthritis, Bladder cancer ([place]), Hypertension, Prostate cancer ([place]), Urinary retention (2019), and UTI (urinary tract infection) (2019).    PAST SURGICAL HISTORY:  Past Surgical History:  Procedure Laterality Date  . CARPAL TUNNEL RELEASE Right   . CHOLECYSTECTOMY  2004  . CYSTOSCOPY W/ URETERAL STENT PLACEMENT Bilateral 12/27/2017   Procedure: CYSTOSCOPY WITH RETROGRADE PYELOGRAM/URETERAL STENT PLACEMENT;  Surgeon: [name], MD;  Location: [facility] ORS;  Service: Urology;  Laterality: Bilateral;  . LEG TENDON SURGERY Right 1958  . TRANSURETHRAL RESECTION OF BLADDER TUMOR N/A 12/27/2017   Procedure: TRANSURETHRAL RESECTION OF BLADDER TUMOR (TURBT);  Surgeon: [name], MD;  Location: [facility] ORS;  Service: Urology;  Laterality: N/A;    FAMILY HISTORY: family history includes Cancer in his mother; Chronic Renal Failure in his mother; Heart disease in his father.  SOCIAL HISTORY:  reports that  has never smoked. he has never used smokeless tobacco. He reports that he does not drink alcohol or use drugs.  ALLERGIES: Penicillins  MEDICATIONS:  Current Outpatient Medications  Medication Sig Dispense Refill  . acetaminophen (TYLENOL) 500 MG tablet Take 1,000 mg by mouth 2 (two) times daily as needed for moderate pain or headache.     . CRANBERRY PO Take 1 tablet by mouth 2 (two) times daily.    Marland Kitchen dexamethasone (DECADRON) 4 MG tablet Take 2 tablets (8 mg total) by mouth  daily. Start the day after carboplatin chemotherapy for 2 days. 30 tablet 1  . finasteride (PROSCAR) 5 MG tablet Take 1 tablet (5 mg total) by mouth daily. 90 tablet 3  . HYDROcodone-acetaminophen  (NORCO/VICODIN) 5-325 MG tablet Take 1 tablet by mouth every 6 (six) hours as needed for severe pain. 20 tablet 0  . lidocaine-prilocaine (EMLA) cream Apply small amount of cream over port site 1 12/ hours before each chemotherapy treatment. Place small piece of saran wrap over cream to protect clothing. 30 g 1  . lidocaine-prilocaine (EMLA) cream Apply to affected area once 30 g 3  . LORazepam (ATIVAN) 0.5 MG tablet Take 1 tablet (0.5 mg total) by mouth every 6 (six) hours as needed (Nausea or vomiting). 30 tablet 0  . nitrofurantoin, macrocrystal-monohydrate, (MACROBID) 100 MG capsule Take 1 capsule (100 mg total) by mouth daily for 15 days. 15 capsule 0  . ondansetron (ZOFRAN) 8 MG tablet Take 1 tablet (8 mg total) by mouth 2 (two) times daily as needed for refractory nausea / vomiting. Start on day 3 after carboplatin chemo. 30 tablet 1  . oxybutynin (DITROPAN) 5 MG tablet Take 1 tablet (5 mg total) by mouth 2 (two) times daily. 60 tablet 0  . prochlorperazine (COMPAZINE) 10 MG tablet Take 1 tablet (10 mg total) by mouth every 6 (six) hours as needed (Nausea or vomiting). 30 tablet 1  . tamsulosin (FLOMAX) 0.4 MG CAPS capsule Take 1 capsule (0.4 mg total) by mouth 2 (two) times daily. 60 capsule 11   No current facility-administered medications for this encounter.     ECOG PERFORMANCE STATUS:  1 - Symptomatic but completely ambulatory  REVIEW OF SYSTEMS: Except for problems with his urinary flow and retention Patient denies any weight loss, fatigue, weakness, fever, chills or night sweats. Patient denies any loss of vision, blurred vision. Patient denies any ringing  of the ears or hearing loss. No irregular heartbeat. Patient denies heart murmur or history of fainting. Patient denies any chest pain or pain radiating to her upper extremities. Patient denies any shortness of breath, difficulty breathing at night, cough or hemoptysis. Patient denies any swelling in the lower legs. Patient denies  any nausea vomiting, vomiting of blood, or coffee ground material in the vomitus. Patient denies any stomach pain. Patient states has had normal bowel movements no significant constipation or diarrhea. Patient denies any dysuria, hematuria or significant nocturia. Patient denies any problems walking, swelling in the joints or loss of balance. Patient denies any skin changes, loss of hair or loss of weight. Patient denies any excessive worrying or anxiety or significant depression. Patient denies any problems with insomnia. Patient denies excessive thirst, polyuria, polydipsia. Patient denies any swollen glands, patient denies easy bruising or easy bleeding. Patient denies any recent infections, allergies or URI. Patient "s visual fields have not changed significantly in recent time.    PHYSICAL EXAM: There were no vitals taken for this visit. Well-developed male with indwelling Foley catheter present. Well-developed well-nourished patient in NAD. HEENT reveals PERLA, EOMI, discs not visualized.  Oral cavity is clear. No oral mucosal lesions are identified. Neck is clear without evidence of cervical or supraclavicular adenopathy. Lungs are clear to A&P. Cardiac examination is essentially unremarkable with regular rate and rhythm without murmur rub or thrill. Abdomen is benign with no organomegaly or masses noted. Motor sensory and DTR levels are equal and symmetric in the upper and lower extremities. Cranial nerves II through XII are grossly intact. Proprioception is intact.  No peripheral adenopathy or edema is identified. No motor or sensory levels are noted. Crude visual fields are within normal range.  LABORATORY DATA: Pathology reports reviewed    RADIOLOGY RESULTS: CT scans reviewed   IMPRESSION: Locally advanced urothelial carcinoma the bladder with involvement of the prostate in 83 year old male with prior history of prostate cancer treated with IM RT radiation therapy to both his prostate and  pelvic nodes.  PLAN: At this time I would like to try simulation. Based on his prior radiation to his pelvis and prostate may not be able to give further beneficial radiation therapy to this patient. We will infuse his old treatment plan with new CT scan. We may be able to limit radiation to his bladder and treat to 4000 cGy over 4 weeks using IM RT radiation therapy treatment planning and delivery. Risks and benefits of treatment including bladder irritation possible diarrhea fatigue alteration of blood counts and problems with overexposure of his bowel from previous radiation fields all were discussed with the patient and his wife. We will 6 I have personally ordered CT simulation in about 2 weeks and again will make final determination after emerging his prior treatment plans is best to our ability with his current CT simulation. Patient and wife both comprehend my treatment plan well. Case was discussed with medical oncology.  I would like to take this opportunity to thank you for allowing me to participate in the care of your patient.Noreene Filbert, MD

## 2018-01-14 MED ORDER — CIPROFLOXACIN IN D5W 400 MG/200ML IV SOLN
400.0000 mg | INTRAVENOUS | Status: AC
  Administered 2018-01-15: 400 mg via INTRAVENOUS

## 2018-01-15 ENCOUNTER — Ambulatory Visit: Payer: Medicare Other | Admitting: Anesthesiology

## 2018-01-15 ENCOUNTER — Encounter
Admission: RE | Disposition: A | Discharge status: Home / Self Care | Payer: Self-pay | Source: Ambulatory Visit | Attending: Urology

## 2018-01-15 ENCOUNTER — Ambulatory Visit
Admission: RE | Admit: 2018-01-15 | Discharge: 2018-01-15 | Disposition: A | Discharge status: Home / Self Care | Payer: Medicare Other | Source: Ambulatory Visit | Attending: Urology | Admitting: Urology

## 2018-01-15 ENCOUNTER — Telehealth: Payer: Self-pay | Admitting: *Deleted

## 2018-01-15 ENCOUNTER — Other Ambulatory Visit (INDEPENDENT_AMBULATORY_CARE_PROVIDER_SITE_OTHER): Payer: Self-pay | Admitting: Vascular Surgery

## 2018-01-15 DIAGNOSIS — N1330 Unspecified hydronephrosis: Secondary | ICD-10-CM | POA: Insufficient documentation

## 2018-01-15 DIAGNOSIS — Z79899 Other long term (current) drug therapy: Secondary | ICD-10-CM | POA: Diagnosis not present

## 2018-01-15 DIAGNOSIS — I10 Essential (primary) hypertension: Secondary | ICD-10-CM | POA: Insufficient documentation

## 2018-01-15 DIAGNOSIS — C678 Malignant neoplasm of overlapping sites of bladder: Secondary | ICD-10-CM | POA: Insufficient documentation

## 2018-01-15 DIAGNOSIS — F419 Anxiety disorder, unspecified: Secondary | ICD-10-CM | POA: Diagnosis not present

## 2018-01-15 DIAGNOSIS — Z923 Personal history of irradiation: Secondary | ICD-10-CM | POA: Diagnosis not present

## 2018-01-15 DIAGNOSIS — Z79891 Long term (current) use of opiate analgesic: Secondary | ICD-10-CM | POA: Diagnosis not present

## 2018-01-15 DIAGNOSIS — Z8546 Personal history of malignant neoplasm of prostate: Secondary | ICD-10-CM | POA: Diagnosis not present

## 2018-01-15 DIAGNOSIS — Z87440 Personal history of urinary (tract) infections: Secondary | ICD-10-CM | POA: Diagnosis not present

## 2018-01-15 HISTORY — PX: TRANSURETHRAL RESECTION OF BLADDER TUMOR: SHX2575

## 2018-01-15 SURGERY — TURBT (TRANSURETHRAL RESECTION OF BLADDER TUMOR)
Anesthesia: General | Site: Bladder | Wound class: Clean Contaminated

## 2018-01-15 MED ORDER — PROPOFOL 10 MG/ML IV BOLUS
INTRAVENOUS | Status: AC
  Filled 2018-01-15: qty 20

## 2018-01-15 MED ORDER — LACTATED RINGERS IV SOLN
INTRAVENOUS | Status: DC
  Administered 2018-01-15 (×2): via INTRAVENOUS

## 2018-01-15 MED ORDER — ROCURONIUM BROMIDE 50 MG/5ML IV SOLN
INTRAVENOUS | Status: AC
  Filled 2018-01-15: qty 1

## 2018-01-15 MED ORDER — PROPOFOL 10 MG/ML IV BOLUS
INTRAVENOUS | Status: DC | PRN
  Administered 2018-01-15: 160 mg via INTRAVENOUS

## 2018-01-15 MED ORDER — LIDOCAINE HCL (PF) 2 % IJ SOLN
INTRAMUSCULAR | Status: AC
  Filled 2018-01-15: qty 10

## 2018-01-15 MED ORDER — FENTANYL CITRATE (PF) 100 MCG/2ML IJ SOLN
25.0000 ug | INTRAMUSCULAR | Status: DC | PRN
  Administered 2018-01-15 (×4): 50 ug via INTRAVENOUS

## 2018-01-15 MED ORDER — FENTANYL CITRATE (PF) 100 MCG/2ML IJ SOLN
INTRAMUSCULAR | Status: DC | PRN
  Administered 2018-01-15 (×2): 25 ug via INTRAVENOUS
  Administered 2018-01-15: 50 ug via INTRAVENOUS

## 2018-01-15 MED ORDER — FENTANYL CITRATE (PF) 100 MCG/2ML IJ SOLN
INTRAMUSCULAR | Status: AC
  Filled 2018-01-15: qty 2

## 2018-01-15 MED ORDER — CIPROFLOXACIN IN D5W 400 MG/200ML IV SOLN
INTRAVENOUS | Status: AC
  Filled 2018-01-15: qty 200

## 2018-01-15 MED ORDER — OXYCODONE HCL 5 MG/5ML PO SOLN: 5.0000 mg | Freq: Once | ORAL | Status: DC | PRN

## 2018-01-15 MED ORDER — OXYCODONE HCL 5 MG PO TABS: 5.0000 mg | ORAL_TABLET | Freq: Once | ORAL | Status: DC | PRN

## 2018-01-15 MED ORDER — FENTANYL CITRATE (PF) 100 MCG/2ML IJ SOLN
INTRAMUSCULAR | Status: AC
  Administered 2018-01-15: 50 ug via INTRAVENOUS
  Filled 2018-01-15: qty 2

## 2018-01-15 MED ORDER — SUGAMMADEX SODIUM 200 MG/2ML IV SOLN
INTRAVENOUS | Status: DC | PRN
  Administered 2018-01-15: 171.4 mg via INTRAVENOUS

## 2018-01-15 MED ORDER — ROCURONIUM BROMIDE 100 MG/10ML IV SOLN
INTRAVENOUS | Status: DC | PRN
  Administered 2018-01-15: 40 mg via INTRAVENOUS
  Administered 2018-01-15 (×2): 10 mg via INTRAVENOUS

## 2018-01-15 MED ORDER — LIDOCAINE HCL (CARDIAC) 20 MG/ML IV SOLN
INTRAVENOUS | Status: DC | PRN
  Administered 2018-01-15: 60 mg via INTRAVENOUS

## 2018-01-15 MED ORDER — FAMOTIDINE 20 MG PO TABS
20.0000 mg | ORAL_TABLET | Freq: Once | ORAL | Status: AC
  Administered 2018-01-15: 20 mg via ORAL

## 2018-01-15 MED ORDER — FAMOTIDINE 20 MG PO TABS
ORAL_TABLET | ORAL | Status: AC
  Filled 2018-01-15: qty 1

## 2018-01-15 MED ORDER — ONDANSETRON HCL 4 MG/2ML IJ SOLN
INTRAMUSCULAR | Status: DC | PRN
  Administered 2018-01-15: 4 mg via INTRAVENOUS

## 2018-01-15 SURGICAL SUPPLY — 31 items
BAG DRAIN CYSTO-URO LG1000N (MISCELLANEOUS) ×3 IMPLANT
BAG URINE DRAINAGE (UROLOGICAL SUPPLIES) ×3 IMPLANT
BAG URO DRAIN 2000ML W/SPOUT (MISCELLANEOUS) ×3 IMPLANT
BRUSH SCRUB EZ  4% CHG (MISCELLANEOUS) ×2
BRUSH SCRUB EZ 4% CHG (MISCELLANEOUS) ×1 IMPLANT
CATH FOL 2WAY LX 20X30 (CATHETERS) ×3 IMPLANT
CATH FOLEY 2WAY  5CC 16FR (CATHETERS) ×2
CATH URTH 16FR FL 2W BLN LF (CATHETERS) ×1 IMPLANT
DRAPE UTILITY 15X26 TOWEL STRL (DRAPES) ×3 IMPLANT
DRSG TELFA 3X8 NADH (GAUZE/BANDAGES/DRESSINGS) ×3 IMPLANT
DRSG TELFA 4X3 1S NADH ST (GAUZE/BANDAGES/DRESSINGS) ×3 IMPLANT
ELECT LOOP 22F BIPOLAR SML (ELECTROSURGICAL) ×3
ELECT REM PT RETURN 9FT ADLT (ELECTROSURGICAL)
ELECTRODE LOOP 22F BIPOLAR SML (ELECTROSURGICAL) ×1 IMPLANT
ELECTRODE REM PT RTRN 9FT ADLT (ELECTROSURGICAL) IMPLANT
GLOVE BIO SURGEON STRL SZ 6.5 (GLOVE) ×2 IMPLANT
GLOVE BIO SURGEONS STRL SZ 6.5 (GLOVE) ×1
GOWN STRL REUS W/ TWL LRG LVL3 (GOWN DISPOSABLE) ×2 IMPLANT
GOWN STRL REUS W/TWL LRG LVL3 (GOWN DISPOSABLE) ×4
KIT TURNOVER CYSTO (KITS) ×3 IMPLANT
LOOP CUT BIPOLAR 24F LRG (ELECTROSURGICAL) IMPLANT
NDL SAFETY ECLIPSE 18X1.5 (NEEDLE) ×1 IMPLANT
NEEDLE HYPO 18GX1.5 SHARP (NEEDLE) ×2
PACK CYSTO AR (MISCELLANEOUS) ×3 IMPLANT
SET IRRIG Y TYPE TUR BLADDER L (SET/KITS/TRAYS/PACK) ×3 IMPLANT
SET IRRIGATING DISP (SET/KITS/TRAYS/PACK) ×3 IMPLANT
SOL .9 NS 3000ML IRR  AL (IV SOLUTION) ×30
SOL .9 NS 3000ML IRR UROMATIC (IV SOLUTION) ×15 IMPLANT
SURGILUBE 2OZ TUBE FLIPTOP (MISCELLANEOUS) ×3 IMPLANT
SYRINGE IRR TOOMEY STRL 70CC (SYRINGE) ×3 IMPLANT
WATER STERILE IRR 1000ML POUR (IV SOLUTION) ×3 IMPLANT

## 2018-01-15 NOTE — OR Nursing (Signed)
Foley cath not in [facility] - inserted at end of case per PACU RN.  Dr. Erlene Quan in to see pt 1351, advises to teach spouse how to irrigate if needed at home.

## 2018-01-15 NOTE — Anesthesia Postprocedure Evaluation (Signed)
Anesthesia Post Note  Patient: DEQUANTE TREMAINE  Procedure(s) Performed: TRANSURETHRAL RESECTION OF BLADDER TUMOR (TURBT) (N/A Bladder)  Patient location during evaluation: PACU Anesthesia Type: General Level of consciousness: awake and alert Pain management: pain level controlled Vital Signs Assessment: post-procedure vital signs reviewed and stable Respiratory status: spontaneous breathing, nonlabored ventilation, respiratory function stable and patient connected to nasal cannula oxygen Cardiovascular status: blood pressure returned to baseline and stable Postop Assessment: no apparent nausea or vomiting Anesthetic complications: no     Last Vitals:  Vitals:   01/15/18 1352 01/15/18 1425  BP: 100/63 127/67  Pulse: 78 75  Resp: 16 16  Temp: 36.4 C   SpO2: 99% 100%    Last Pain:  Vitals:   01/15/18 1352  TempSrc: Temporal  PainSc:                  Martha Clan

## 2018-01-15 NOTE — Interval H&P Note (Signed)
History and Physical Interval Note:  01/15/2018 9:43 AM  Vincent Poole  has presented today for surgery, with the diagnosis of malignant neoplasm of overlapping sites of bladder  The various methods of treatment have been discussed with the patient and family. After consideration of risks, benefits and other options for treatment, the patient has consented to  Procedure(s) with comments: TRANSURETHRAL RESECTION OF BLADDER TUMOR (TURBT) (N/A) - Need 2 hrs for this case please as a surgical intervention .  The patient's history has been reviewed, patient examined, no change in status, stable for surgery.  I have reviewed the patient's chart and labs.  Questions were answered to the patient's satisfaction.    RRR CTAB  [name]

## 2018-01-15 NOTE — Anesthesia Post-op Follow-up Note (Signed)
Anesthesia QCDR form completed.        

## 2018-01-15 NOTE — Transfer of Care (Signed)
Immediate Anesthesia Transfer of Care Note  Patient: Vincent Poole  Procedure(s) Performed: TRANSURETHRAL RESECTION OF BLADDER TUMOR (TURBT) (N/A Bladder)  Patient Location:    Anesthesia Type:General  Level of Consciousness: awake and sedated  Airway & Oxygen Therapy: Patient Spontanous Breathing and Patient connected to face mask oxygen  Post-op Assessment: Report given to RN and Post -op Vital signs reviewed and stable  Post vital signs: Reviewed and stable  Last Vitals:  Vitals:   01/15/18 0831 01/15/18 1234  BP: 136/77 108/70  Pulse: 99 86  Resp: 19 19  Temp: (!) 35.4 C 36.4 C  SpO2: 99% 100%    Last Pain:  Vitals:   01/15/18 1234  TempSrc:   PainSc: 0-No pain         Complications: No apparent anesthesia complications

## 2018-01-15 NOTE — Op Note (Addendum)
Date of procedure: 01/15/18  Preoperative diagnosis:  1. T4 bladder cancer  Postoperative diagnosis:  1. Same as above  Procedure: 1. TURBT, large greater than 5 cm  Surgeon: [name], MD  Anesthesia: General  Complications: None  Intraoperative findings: Extensive tumor involving majority of posterior and bilateral lateral walls of the bladder, primarily papillary in nature extending into diverticula involving greater than 5 cm of bladder surface area.  More nodular, high-grade appearing tumor on anterior bladder neck extending to the right bladder neck and into the prostate.   EBL: 200 cc  Specimens: Bladder tumor  Drains: 20 French 2-way Foley catheter  Indication: Vincent Poole is a 83 y.o. patient with T4 bladder cancer with planned tri-modal therapy who returns to the operating room today for further debulking of his bladder tumor.  After reviewing the management options for treatment, he elected to proceed with the above surgical procedure(s). We have discussed the potential benefits and risks of the procedure, side effects of the proposed treatment, the likelihood of the patient achieving the goals of the procedure, and any potential problems that might occur during the procedure or recuperation. Informed consent has been obtained.  Description of procedure:  The patient was taken to the operating room and general anesthesia was induced.  The patient was placed in the dorsal lithotomy position, prepped and draped in the usual sterile fashion, and preoperative antibiotics were administered. A preoperative time-out was performed.   A 26 French resectoscope was advanced per urethra into the bladder using a blunt obturator.  At this time, the bladder was inspected and noted to be grossly  abnormal nearly completely full with tumor without which was papillary in nature.  Any obvious normal mucosa at the beginning of the resection.  The primary masslike tumor involved the  posterior bladder wall, left and right lateral bladder walls this was taken down in a methodical method down to level of the muscularis propria in most locations.   his bladder was noted to be markedly trabeculated with bladder tumor extending down into the saccules and diverticula.  This tumor was partially resected but mostly fulgurated and bluntly removed using the loop in order to avoid any injury or perforation.  The bladder had to be evacuated of bladder tumor multiple times in order to be able to visualize the tumor itself versus what it been previously resected.  Ultimately, I was able to resect the majority of this tumor with only small areas remaining.  Any suspicious area was fulgurated using Bugbee electrocautery for thermal ablation purposes.  Next, tumor at the bladder neck extending on the right lateral wall up to the anterior bladder neck was resected.  This tumor was discretely different than the other tumor and significant a more nodular and hypervascular.  There is also several areas of bright yellow necrosis within this tumor.  The margin of normal bladder to this tumor is very difficult to direct differentiate.  I resected as much as a felt was safe without perforating the bladder at this point in time.  Bilateral stones are seen emanating from the UOs in this area in particular seem to be spared from tumor.  There is significant tumor encroaching but not adjacent to it but not involving the UOs.  Finally, the remainder of the bladder chips were evacuated from the bladder using a [place]y syringe and fulgurated using Bugbee electrocautery until hemostasis was adequate.  At this point in time, the scope was removed and a 20 [place] Foley  was replaced.  The bladder was filled with 30 cc of sterile water.  Was irrigated several times to ensure that it was clear.  The patient was then clean and dry, repositioned in supine position, reversed from anesthesia, taken the PACU in stable condition.   Resection was performed for ~2 hours.    Plan: I would like the patient follow-up in about a month after he is initiated chemo and radiation.  We will plan for stent exchange versus removal as well as a voiding trial at that point in time.  He will likely need additional TURBT in the future following radiation if any viable tumor remains.  He was incompletely resected today.  [name], M.D.

## 2018-01-15 NOTE — Anesthesia Preprocedure Evaluation (Signed)
Anesthesia Evaluation  Patient identified by MRN, date of birth, ID band Patient awake    Reviewed: Allergy & Precautions, H&P , NPO status , Patient's Chart, lab work & pertinent test results  History of Anesthesia Complications Negative for: history of anesthetic complications  Airway Mallampati: III  TM Distance: >3 FB Neck ROM: limited    Dental  (+) Chipped, Poor Dentition, Missing, Edentulous Upper   Pulmonary neg pulmonary ROS, neg shortness of breath,           Cardiovascular Exercise Tolerance: Good hypertension, (-) angina(-) Past MI and (-) DOE      Neuro/Psych Anxiety  Neuromuscular disease negative psych ROS   GI/Hepatic negative GI ROS, Neg liver ROS, neg GERD  ,  Endo/Other  negative endocrine ROS  Renal/GU      Musculoskeletal  (+) Arthritis ,   Abdominal   Peds  Hematology negative hematology ROS (+)   Anesthesia Other Findings Past Medical History: No date: Anxiety No date: Arthritis No date: Bladder cancer ([facility]) No date: Hypertension No date: Prostate cancer ([facility]) 2019: Urinary retention     Comment:  foley catheter place 11/2017 2019: UTI (urinary tract infection)     Comment:  frequent UTI's over last year  Past Surgical History: No date: CARPAL TUNNEL RELEASE; Right 2004: CHOLECYSTECTOMY 12/27/2017: CYSTOSCOPY W/ URETERAL STENT PLACEMENT; Bilateral     Comment:  Procedure: CYSTOSCOPY WITH RETROGRADE PYELOGRAM/URETERAL              STENT PLACEMENT;  Surgeon: Brandon, Ashley, MD;                Location: [facility] ORS;  Service: Urology;  Laterality:               Bilateral; 1958: LEG TENDON SURGERY; Right 12/27/2017: TRANSURETHRAL RESECTION OF BLADDER TUMOR; N/A     Comment:  Procedure: TRANSURETHRAL RESECTION OF BLADDER TUMOR               (TURBT);  Surgeon: Brandon, Ashley, MD;  Location: [facility]               ORS;  Service: Urology;  Laterality: N/A;      Reproductive/Obstetrics negative OB ROS                             Anesthesia Physical  Anesthesia Plan  ASA: III  Anesthesia Plan: General ETT   Post-op Pain Management:    Induction: Intravenous  PONV Risk Score and Plan: Ondansetron, Dexamethasone and Treatment may vary due to age or medical condition  Airway Management Planned: Oral ETT  Additional Equipment:   Intra-op Plan:   Post-operative Plan: Extubation in OR  Informed Consent: I have reviewed the patients History and Physical, chart, labs and discussed the procedure including the risks, benefits and alternatives for the proposed anesthesia with the patient or authorized representative who has indicated his/her understanding and acceptance.   Dental Advisory Given  Plan Discussed with: Anesthesiologist, CRNA and Surgeon  Anesthesia Plan Comments: (Patient consented for risks of anesthesia including but not limited to:  - adverse reactions to medications - damage to teeth, lips or other oral mucosa - sore throat or hoarseness - Damage to heart, brain, lungs or loss of life  Patient voiced understanding.)        Anesthesia Quick Evaluation  

## 2018-01-15 NOTE — Discharge Instructions (Addendum)
Transurethral Resection of Bladder Tumor (TURBT) or Bladder Biopsy   Definition:  Transurethral Resection of the Bladder Tumor is a surgical procedure used to diagnose and remove tumors within the bladder. TURBT is the most common treatment for early stage bladder cancer.  General instructions:     Your recent bladder surgery requires very little post hospital care but some definite precautions.  Despite the fact that no skin incisions were used, the area around the bladder incisions are raw and covered with scabs to promote healing and prevent bleeding. Certain precautions are needed to insure that the scabs are not disturbed over the next 2-4 weeks while the healing proceeds.  Because the raw surface inside your bladder and the irritating effects of urine you may expect frequency of urination and/or urgency (a stronger desire to urinate) and perhaps even getting up at night more often. This will usually resolve or improve slowly over the healing period. You may see some blood in your urine over the first 6 weeks. Do not be alarmed, even if the urine was clear for a while. Get off your feet and drink lots of fluids until clearing occurs. If you start to pass clots or don't improve call us.  Diet:  You may return to your normal diet immediately. Because of the raw surface of your bladder, alcohol, spicy foods, foods high in acid and drinks with caffeine may cause irritation or frequency and should be used in moderation. To keep your urine flowing freely and avoid constipation, drink plenty of fluids during the day (8-10 glasses). Tip: Avoid cranberry juice because it is very acidic.  Activity:  Your physical activity doesn't need to be restricted. However, if you are very active, you may see some blood in the urine. We suggest that you reduce your activity under the circumstances until the bleeding has stopped.  Bowels:  It is important to keep your bowels regular during the postoperative  period. Straining with bowel movements can cause bleeding. A bowel movement every other day is reasonable. Use a mild laxative if needed, such as milk of magnesia 2-3 tablespoons, or 2 Dulcolax tablets. Call if you continue to have problems. If you had been taking narcotics for pain, before, during or after your surgery, you may be constipated. Take a laxative if necessary.    Medication:  You should resume your pre-surgery medications unless told not to. In addition you may be given an antibiotic to prevent or treat infection. Antibiotics are not always necessary. All medication should be taken as prescribed until the bottles are finished unless you are having an unusual reaction to one of the drugs.  Flush catheter with 60 cc of saline as needed.   [place], [place] [address] [phone]  AMBULATORY SURGERY  DISCHARGE INSTRUCTIONS   1) The drugs that you were given will stay in your system until tomorrow so for the next 24 hours you should not:  A) Drive an automobile B) Make any legal decisions C) Drink any alcoholic beverage   2) You may resume regular meals tomorrow.  Today it is better to start with liquids and gradually work up to solid foods.  You may eat anything you prefer, but it is better to start with liquids, then soup and crackers, and gradually work up to solid foods.   3) Please notify your doctor immediately if you have any unusual bleeding, trouble breathing, redness and pain at the surgery site, drainage, fever, or pain not relieved by medication.  Additional Instructions: ° ° ° ° ° ° ° °Please contact your physician with any problems or Same Day Surgery at [phone], Monday through Friday 6 am to 4 pm, or [place] [facility] [place] Main number at [phone]. ° ° ° ° °

## 2018-01-15 NOTE — OR Nursing (Signed)
Has foley with leg strap, below level of bladder, and off floor.

## 2018-01-15 NOTE — Telephone Encounter (Signed)
Called patient's home and left message on voicemail about port placement for 3/18 and he will need to be at medical mall at 6:45 to register at admitting desk, then they will take him back to area where the port will beplaced and he will sign consent and get IV started.  He will have to be NPO after midnight the night before his scan. When he wakes up and if he needs to take any meds it will be with sip of water only to get meds to go down.  Also he will need driver to take him home. I also have called his wife on cell phone [phone] and left same message.  I did ask her to call me back and make sure they are ok with above appts and to let me know they got message and if they have any questions.

## 2018-01-15 NOTE — Telephone Encounter (Signed)
-----   Message from Devona Konig, [place] sent at 01/12/2018 10:00 AM EST ----- Have the patient report to the [facility] on 01/22/18 @ 6:45 am, NPO, have someone with him and he can take all morning meds with small sips of water. The procedure will be with Dew. Thank you ! ----- Message ----- From: Luella Cook, RN Sent: 01/12/2018   9:24 AM To: Devona Konig, CMA  Can you put port in for this guy on Monday 3/18.  I say that date because he is having debulking surgery on 3/11 with Dr. Erlene Quan and she does not put ports in. He will stay overnight per wife. And he will come later next week for chemo education class so I did not want to add port placement in the same week.  He will start chemo on 3/19. Let me know if that can work.  Thanks [employer]

## 2018-01-15 NOTE — Anesthesia Procedure Notes (Signed)
Procedure Name: Intubation Date/Time: 01/15/2018 10:16 AM Performed by: Allean Found, CRNA Pre-anesthesia Checklist: Patient identified, Emergency Drugs available, Suction available, Patient being monitored and Timeout performed Patient Re-evaluated:Patient Re-evaluated prior to induction Oxygen Delivery Method: Circle system utilized Preoxygenation: Pre-oxygenation with 100% oxygen Induction Type: IV induction Ventilation: Mask ventilation with difficulty Laryngoscope Size: Mac and 3 Grade View: Grade I Tube type: Oral Tube size: 7.5 mm Number of attempts: 1 Airway Equipment and Method: Stylet Placement Confirmation: ETT inserted through vocal cords under direct vision,  positive ETCO2 and breath sounds checked- equal and bilateral Secured at: 24 cm Tube secured with: Tape Dental Injury: Teeth and Oropharynx as per pre-operative assessment  Comments: Difficult mask airway due to beard and slack cheeks

## 2018-01-16 ENCOUNTER — Other Ambulatory Visit: Payer: Self-pay | Admitting: Pathology

## 2018-01-16 ENCOUNTER — Encounter: Payer: Self-pay | Admitting: Urology

## 2018-01-16 NOTE — Patient Instructions (Signed)
Gemcitabine injection What is this medicine? GEMCITABINE (jem SIT a been) is a chemotherapy drug. This medicine is used to treat many types of cancer like breast cancer, lung cancer, pancreatic cancer, and ovarian cancer. This medicine may be used for other purposes; ask your health care provider or pharmacist if you have questions. COMMON BRAND NAME(S): Gemzar What should I tell my health care provider before I take this medicine? They need to know if you have any of these conditions: -blood disorders -infection -kidney disease -liver disease -recent or ongoing radiation therapy -an unusual or allergic reaction to gemcitabine, other chemotherapy, other medicines, foods, dyes, or preservatives -pregnant or trying to get pregnant -breast-feeding How should I use this medicine? This drug is given as an infusion into a vein. It is administered in a hospital or clinic by a specially trained health care professional. Talk to your pediatrician regarding the use of this medicine in children. Special care may be needed. Overdosage: If you think you have taken too much of this medicine contact a poison control center or emergency room at once. NOTE: This medicine is only for you. Do not share this medicine with others. What if I miss a dose? It is important not to miss your dose. Call your doctor or health care professional if you are unable to keep an appointment. What may interact with this medicine? -medicines to increase blood counts like filgrastim, pegfilgrastim, sargramostim -some other chemotherapy drugs like cisplatin -vaccines Talk to your doctor or health care professional before taking any of these medicines: -acetaminophen -aspirin -ibuprofen -ketoprofen -naproxen This list may not describe all possible interactions. Give your health care provider a list of all the medicines, herbs, non-prescription drugs, or dietary supplements you use. Also tell them if you smoke, drink alcohol,  or use illegal drugs. Some items may interact with your medicine. What should I watch for while using this medicine? Visit your doctor for checks on your progress. This drug may make you feel generally unwell. This is not uncommon, as chemotherapy can affect healthy cells as well as cancer cells. Report any side effects. Continue your course of treatment even though you feel ill unless your doctor tells you to stop. In some cases, you may be given additional medicines to help with side effects. Follow all directions for their use. Call your doctor or health care professional for advice if you get a fever, chills or sore throat, or other symptoms of a cold or flu. Do not treat yourself. This drug decreases your body's ability to fight infections. Try to avoid being around people who are sick. This medicine may increase your risk to bruise or bleed. Call your doctor or health care professional if you notice any unusual bleeding. Be careful brushing and flossing your teeth or using a toothpick because you may get an infection or bleed more easily. If you have any dental work done, tell your dentist you are receiving this medicine. Avoid taking products that contain aspirin, acetaminophen, ibuprofen, naproxen, or ketoprofen unless instructed by your doctor. These medicines may hide a fever. Women should inform their doctor if they wish to become pregnant or think they might be pregnant. There is a potential for serious side effects to an unborn child. Talk to your health care professional or pharmacist for more information. Do not breast-feed an infant while taking this medicine. What side effects may I notice from receiving this medicine? Side effects that you should report to your doctor or health care professional as   soon as possible: -allergic reactions like skin rash, itching or hives, swelling of the face, lips, or tongue -low blood counts - this medicine may decrease the number of white blood cells,  red blood cells and platelets. You may be at increased risk for infections and bleeding. -signs of infection - fever or chills, cough, sore throat, pain or difficulty passing urine -signs of decreased platelets or bleeding - bruising, pinpoint red spots on the skin, black, tarry stools, blood in the urine -signs of decreased red blood cells - unusually weak or tired, fainting spells, lightheadedness -breathing problems -chest pain -mouth sores -nausea and vomiting -pain, swelling, redness at site where injected -pain, tingling, numbness in the hands or feet -stomach pain -swelling of ankles, feet, hands -unusual bleeding Side effects that usually do not require medical attention (report to your doctor or health care professional if they continue or are bothersome): -constipation -diarrhea -hair loss -loss of appetite -stomach upset This list may not describe all possible side effects. Call your doctor for medical advice about side effects. You may report side effects to FDA at 1-800-FDA-1088. Where should I keep my medicine? This drug is given in a hospital or clinic and will not be stored at home. NOTE: This sheet is a summary. It may not cover all possible information. If you have questions about this medicine, talk to your doctor, pharmacist, or health care provider.  2018 Elsevier/Gold Standard (2008-03-04 18:45:54) Carboplatin injection What is this medicine? CARBOPLATIN (KAR boe pla tin) is a chemotherapy drug. It targets fast dividing cells, like cancer cells, and causes these cells to die. This medicine is used to treat ovarian cancer and many other cancers. This medicine may be used for other purposes; ask your health care provider or pharmacist if you have questions. COMMON BRAND NAME(S): Paraplatin What should I tell my health care provider before I take this medicine? They need to know if you have any of these conditions: -blood disorders -hearing problems -kidney  disease -recent or ongoing radiation therapy -an unusual or allergic reaction to carboplatin, cisplatin, other chemotherapy, other medicines, foods, dyes, or preservatives -pregnant or trying to get pregnant -breast-feeding How should I use this medicine? This drug is usually given as an infusion into a vein. It is administered in a hospital or clinic by a specially trained health care professional. Talk to your pediatrician regarding the use of this medicine in children. Special care may be needed. Overdosage: If you think you have taken too much of this medicine contact a poison control center or emergency room at once. NOTE: This medicine is only for you. Do not share this medicine with others. What if I miss a dose? It is important not to miss a dose. Call your doctor or health care professional if you are unable to keep an appointment. What may interact with this medicine? -medicines for seizures -medicines to increase blood counts like filgrastim, pegfilgrastim, sargramostim -some antibiotics like amikacin, gentamicin, neomycin, streptomycin, tobramycin -vaccines Talk to your doctor or health care professional before taking any of these medicines: -acetaminophen -aspirin -ibuprofen -ketoprofen -naproxen This list may not describe all possible interactions. Give your health care provider a list of all the medicines, herbs, non-prescription drugs, or dietary supplements you use. Also tell them if you smoke, drink alcohol, or use illegal drugs. Some items may interact with your medicine. What should I watch for while using this medicine? Your condition will be monitored carefully while you are receiving this medicine. You will need   important blood work done while you are taking this medicine. This drug may make you feel generally unwell. This is not uncommon, as chemotherapy can affect healthy cells as well as cancer cells. Report any side effects. Continue your course of treatment even  though you feel ill unless your doctor tells you to stop. In some cases, you may be given additional medicines to help with side effects. Follow all directions for their use. Call your doctor or health care professional for advice if you get a fever, chills or sore throat, or other symptoms of a cold or flu. Do not treat yourself. This drug decreases your body's ability to fight infections. Try to avoid being around people who are sick. This medicine may increase your risk to bruise or bleed. Call your doctor or health care professional if you notice any unusual bleeding. Be careful brushing and flossing your teeth or using a toothpick because you may get an infection or bleed more easily. If you have any dental work done, tell your dentist you are receiving this medicine. Avoid taking products that contain aspirin, acetaminophen, ibuprofen, naproxen, or ketoprofen unless instructed by your doctor. These medicines may hide a fever. Do not become pregnant while taking this medicine. Women should inform their doctor if they wish to become pregnant or think they might be pregnant. There is a potential for serious side effects to an unborn child. Talk to your health care professional or pharmacist for more information. Do not breast-feed an infant while taking this medicine. What side effects may I notice from receiving this medicine? Side effects that you should report to your doctor or health care professional as soon as possible: -allergic reactions like skin rash, itching or hives, swelling of the face, lips, or tongue -signs of infection - fever or chills, cough, sore throat, pain or difficulty passing urine -signs of decreased platelets or bleeding - bruising, pinpoint red spots on the skin, black, tarry stools, nosebleeds -signs of decreased red blood cells - unusually weak or tired, fainting spells, lightheadedness -breathing problems -changes in hearing -changes in vision -chest pain -high  blood pressure -low blood counts - This drug may decrease the number of white blood cells, red blood cells and platelets. You may be at increased risk for infections and bleeding. -nausea and vomiting -pain, swelling, redness or irritation at the injection site -pain, tingling, numbness in the hands or feet -problems with balance, talking, walking -trouble passing urine or change in the amount of urine Side effects that usually do not require medical attention (report to your doctor or health care professional if they continue or are bothersome): -hair loss -loss of appetite -metallic taste in the mouth or changes in taste This list may not describe all possible side effects. Call your doctor for medical advice about side effects. You may report side effects to FDA at 1-800-FDA-1088. Where should I keep my medicine? This drug is given in a hospital or clinic and will not be stored at home. NOTE: This sheet is a summary. It may not cover all possible information. If you have questions about this medicine, talk to your doctor, pharmacist, or health care provider.  2018 Elsevier/Gold Standard (2008-01-29 14:38:05)  

## 2018-01-17 ENCOUNTER — Inpatient Hospital Stay: Payer: Medicare Other

## 2018-01-17 LAB — SURGICAL PATHOLOGY

## 2018-01-21 MED ORDER — CLINDAMYCIN PHOSPHATE 300 MG/50ML IV SOLN
300.0000 mg | Freq: Once | INTRAVENOUS | Status: AC
  Administered 2018-01-22: 300 mg via INTRAVENOUS

## 2018-01-22 ENCOUNTER — Ambulatory Visit
Admission: RE | Admit: 2018-01-22 | Discharge: 2018-01-22 | Disposition: A | Discharge status: Home / Self Care | Payer: Medicare Other | Source: Ambulatory Visit | Attending: Vascular Surgery | Admitting: Vascular Surgery

## 2018-01-22 ENCOUNTER — Encounter: Payer: Self-pay | Admitting: Vascular Surgery

## 2018-01-22 ENCOUNTER — Encounter
Admission: RE | Disposition: A | Discharge status: Home / Self Care | Payer: Self-pay | Source: Ambulatory Visit | Attending: Vascular Surgery

## 2018-01-22 DIAGNOSIS — Z8546 Personal history of malignant neoplasm of prostate: Secondary | ICD-10-CM | POA: Insufficient documentation

## 2018-01-22 DIAGNOSIS — C679 Malignant neoplasm of bladder, unspecified: Secondary | ICD-10-CM

## 2018-01-22 DIAGNOSIS — R339 Retention of urine, unspecified: Secondary | ICD-10-CM | POA: Diagnosis not present

## 2018-01-22 DIAGNOSIS — Z87440 Personal history of urinary (tract) infections: Secondary | ICD-10-CM | POA: Insufficient documentation

## 2018-01-22 DIAGNOSIS — Z79899 Other long term (current) drug therapy: Secondary | ICD-10-CM | POA: Diagnosis not present

## 2018-01-22 DIAGNOSIS — I10 Essential (primary) hypertension: Secondary | ICD-10-CM | POA: Insufficient documentation

## 2018-01-22 DIAGNOSIS — C678 Malignant neoplasm of overlapping sites of bladder: Secondary | ICD-10-CM | POA: Insufficient documentation

## 2018-01-22 HISTORY — PX: PORTA CATH INSERTION: CATH118285

## 2018-01-22 SURGERY — PORTA CATH INSERTION
Anesthesia: Moderate Sedation

## 2018-01-22 MED ORDER — MIDAZOLAM HCL 2 MG/2ML IJ SOLN
INTRAMUSCULAR | Status: DC | PRN
  Administered 2018-01-22: 1 mg via INTRAVENOUS
  Administered 2018-01-22: 2 mg via INTRAVENOUS

## 2018-01-22 MED ORDER — SODIUM CHLORIDE 0.9 % IR SOLN
Freq: Once | Status: AC
  Administered 2018-01-22: 09:00:00
  Filled 2018-01-22: qty 2

## 2018-01-22 MED ORDER — FENTANYL CITRATE (PF) 100 MCG/2ML IJ SOLN
INTRAMUSCULAR | Status: AC
  Filled 2018-01-22: qty 2

## 2018-01-22 MED ORDER — LIDOCAINE-EPINEPHRINE (PF) 1 %-1:200000 IJ SOLN
INTRAMUSCULAR | Status: AC
  Filled 2018-01-22: qty 30

## 2018-01-22 MED ORDER — SODIUM CHLORIDE 0.9 % IV SOLN
INTRAVENOUS | Status: DC
  Administered 2018-01-22: 08:00:00 via INTRAVENOUS

## 2018-01-22 MED ORDER — FENTANYL CITRATE (PF) 100 MCG/2ML IJ SOLN
INTRAMUSCULAR | Status: DC | PRN
  Administered 2018-01-22: 50 ug via INTRAVENOUS
  Administered 2018-01-22: 25 ug via INTRAVENOUS

## 2018-01-22 MED ORDER — HEPARIN (PORCINE) IN NACL 2-0.9 UNIT/ML-% IJ SOLN
INTRAMUSCULAR | Status: AC
  Filled 2018-01-22: qty 1000

## 2018-01-22 MED ORDER — CLINDAMYCIN PHOSPHATE 300 MG/50ML IV SOLN
INTRAVENOUS | Status: AC
  Filled 2018-01-22: qty 50

## 2018-01-22 MED ORDER — ONDANSETRON HCL 4 MG/2ML IJ SOLN: 4.0000 mg | Freq: Four times a day (QID) | INTRAMUSCULAR | Status: DC | PRN

## 2018-01-22 MED ORDER — MIDAZOLAM HCL 5 MG/5ML IJ SOLN
INTRAMUSCULAR | Status: AC
  Filled 2018-01-22: qty 5

## 2018-01-22 MED ORDER — HYDROMORPHONE HCL 1 MG/ML IJ SOLN: 1.0000 mg | Freq: Once | INTRAMUSCULAR | Status: DC | PRN

## 2018-01-22 SURGICAL SUPPLY — 7 items
KIT PORT POWER 8FR ISP CVUE (Port) ×3 IMPLANT
PACK ANGIOGRAPHY (CUSTOM PROCEDURE TRAY) ×3 IMPLANT
PAD GROUND ADULT SPLIT (MISCELLANEOUS) ×3 IMPLANT
PENCIL ELECTRO HAND CTR (MISCELLANEOUS) ×3 IMPLANT
SUT MNCRL AB 4-0 PS2 18 (SUTURE) ×3 IMPLANT
SUT PROLENE 0 CT 1 30 (SUTURE) ×3 IMPLANT
SUTURE VIC 3-0 (SUTURE) ×3 IMPLANT

## 2018-01-22 NOTE — H&P (Signed)
[facility] History & Physical Update  The patient was interviewed and re-examined.  The patient's previous History and Physical has been reviewed and is unchanged.  There is no change in the plan of care. We plan to proceed with the scheduled procedure.  Leotis Pain, MD  01/22/2018, 8:10 AM

## 2018-01-22 NOTE — Discharge Instructions (Signed)
Implanted Port Insertion, Care After °This sheet gives you information about how to care for yourself after your procedure. Your health care provider may also give you more specific instructions. If you have problems or questions, contact your health care provider. °What can I expect after the procedure? °After your procedure, it is common to have: °· Discomfort at the port insertion site. °· Bruising on the skin over the port. This should improve over 3-4 days. ° °Follow these instructions at home: °Port care °· After your port is placed, you will get a manufacturer's information card. The card has information about your port. Keep this card with you at all times. °· Take care of the port as told by your health care provider. Ask your health care provider if you or a family member can get training for taking care of the port at home. A home health care nurse may also take care of the port. °· Make sure to remember what type of port you have. °Incision care °· Follow instructions from your health care provider about how to take care of your port insertion site. Make sure you: °? Wash your hands with soap and water before you change your bandage (dressing). If soap and water are not available, use hand sanitizer. °? Change your dressing as told by your health care provider. °? Leave stitches (sutures), skin glue, or adhesive strips in place. These skin closures may need to stay in place for 2 weeks or longer. If adhesive strip edges start to loosen and curl up, you may trim the loose edges. Do not remove adhesive strips completely unless your health care provider tells you to do that. °· Check your port insertion site every day for signs of infection. Check for: °? More redness, swelling, or pain. °? More fluid or blood. °? Warmth. °? Pus or a bad smell. °General instructions °· Do not take baths, swim, or use a hot tub until your health care provider approves. °· Do not lift anything that is heavier than 10 lb (4.5  kg) for a week, or as told by your health care provider. °· Ask your health care provider when it is okay to: °? Return to work or school. °? Resume usual physical activities or sports. °· Do not drive for 24 hours if you were given a medicine to help you relax (sedative). °· Take over-the-counter and prescription medicines only as told by your health care provider. °· Wear a medical alert bracelet in case of an emergency. This will tell any health care providers that you have a port. °· Keep all follow-up visits as told by your health care provider. This is important. °Contact a health care provider if: °· You cannot flush your port with saline as directed, or you cannot draw blood from the port. °· You have a fever or chills. °· You have more redness, swelling, or pain around your port insertion site. °· You have more fluid or blood coming from your port insertion site. °· Your port insertion site feels warm to the touch. °· You have pus or a bad smell coming from the port insertion site. °Get help right away if: °· You have chest pain or shortness of breath. °· You have bleeding from your port that you cannot control. °Summary °· Take care of the port as told by your health care provider. °· Change your dressing as told by your health care provider. °· Keep all follow-up visits as told by your health care provider. °  This information is not intended to replace advice given to you by your health care provider. Make sure you discuss any questions you have with your health care provider. °Document Released: 08/14/2013 Document Revised: 09/14/2016 Document Reviewed: 09/14/2016 °Elsevier Interactive Patient Education © 2017 Elsevier Inc. °Moderate Conscious Sedation, Adult, Care After °These instructions provide you with information about caring for yourself after your procedure. Your health care provider may also give you more specific instructions. Your treatment has been planned according to current medical  practices, but problems sometimes occur. Call your health care provider if you have any problems or questions after your procedure. °What can I expect after the procedure? °After your procedure, it is common: °· To feel sleepy for several hours. °· To feel clumsy and have poor balance for several hours. °· To have poor judgment for several hours. °· To vomit if you eat too soon. ° °Follow these instructions at home: °For at least 24 hours after the procedure: ° °· Do not: °? Participate in activities where you could fall or become injured. °? Drive. °? Use heavy machinery. °? Drink alcohol. °? Take sleeping pills or medicines that cause drowsiness. °? Make important decisions or sign legal documents. °? Take care of children on your own. °· Rest. °Eating and drinking °· Follow the diet recommended by your health care provider. °· If you vomit: °? Drink water, juice, or soup when you can drink without vomiting. °? Make sure you have little or no nausea before eating solid foods. °General instructions °· Have a responsible adult stay with you until you are awake and alert. °· Take over-the-counter and prescription medicines only as told by your health care provider. °· If you smoke, do not smoke without supervision. °· Keep all follow-up visits as told by your health care provider. This is important. °Contact a health care provider if: °· You keep feeling nauseous or you keep vomiting. °· You feel light-headed. °· You develop a rash. °· You have a fever. °Get help right away if: °· You have trouble breathing. °This information is not intended to replace advice given to you by your health care provider. Make sure you discuss any questions you have with your health care provider. °Document Released: 08/14/2013 Document Revised: 03/28/2016 Document Reviewed: 02/13/2016 °Elsevier Interactive Patient Education © 2018 Elsevier Inc. ° °

## 2018-01-22 NOTE — Op Note (Signed)
      [facility]       Operative Note  Date: 01/22/2018  Preoperative diagnosis:  1. Bladder cancer  Postoperative diagnosis:  Same as above  Procedures: #1. Ultrasound guidance for vascular access to the right internal jugular vein. #2. Fluoroscopic guidance for placement of catheter. #3. Placement of CT compatible Port-A-Cath, right internal jugular vein.  Surgeon: Leotis Pain, MD.   Anesthesia: Local with moderate conscious sedation for approximately 20  minutes using 3 mg of Versed and 75 mcg of Fentanyl  Fluoroscopy time: less than 1 minute  Contrast used: 0  Estimated blood loss: 5 cc  Indication for the procedure:  The patient is a 83 y.o.male with bladder cancer.  The patient needs a Port-A-Cath for durable venous access, chemotherapy, lab draws, and CT scans. We are asked to place this. Risks and benefits were discussed and informed consent was obtained.  Description of procedure: The patient was brought to the vascular and interventional radiology suite.  Moderate conscious sedation was administered throughout the procedure during a face to face encounter with the patient with my supervision of the RN administering medicines and monitoring the patient's vital signs, pulse oximetry, telemetry and mental status throughout from the start of the procedure until the patient was taken to the recovery room. The right neck chest and shoulder were sterilely prepped and draped, and a sterile surgical field was created. Ultrasound was used to help visualize a patent right internal jugular vein. This was then accessed under direct ultrasound guidance without difficulty with the Seldinger needle and a permanent image was recorded. A J-wire was placed. After skin nick and dilatation, the peel-away sheath was then placed over the wire. I then anesthetized an area under the clavicle approximately 1-2 fingerbreadths. A transverse incision was created and an inferior pocket was  created with electrocautery and blunt dissection. The port was then brought onto the field, placed into the pocket and secured to the chest wall with 2 Prolene sutures. The catheter was connected to the port and tunneled from the subclavicular incision to the access site. Fluoroscopic guidance was then used to cut the catheter to an appropriate length. The catheter was then placed through the peel-away sheath and the peel-away sheath was removed. The catheter tip was parked in excellent location under fluorocoscopic guidance in the SVC just above the right atrium. The pocket was then irrigated with antibiotic impregnated saline and the wound was closed with a running 3-0 Vicryl and a 4-0 Monocryl. The access incision was closed with a single 4-0 Monocryl. The Huber needle was used to withdraw blood and flush the port with heparinized saline. Dermabond was then placed as a dressing. The patient tolerated the procedure well and was taken to the recovery room in stable condition.   Leotis Pain 01/22/2018 8:52 AM   This note was created with Dragon Medical transcription system. Any errors in dictation are purely unintentional.

## 2018-01-23 ENCOUNTER — Inpatient Hospital Stay (HOSPITAL_BASED_OUTPATIENT_CLINIC_OR_DEPARTMENT_OTHER): Payer: Medicare Other | Admitting: Oncology

## 2018-01-23 ENCOUNTER — Inpatient Hospital Stay: Payer: Medicare Other

## 2018-01-23 ENCOUNTER — Encounter: Payer: Self-pay | Admitting: Oncology

## 2018-01-23 VITALS — BP 116/71 | HR 99 | Temp 97.5°F | Resp 18 | Ht 71.0 in | Wt 189.0 lb

## 2018-01-23 DIAGNOSIS — Z79899 Other long term (current) drug therapy: Secondary | ICD-10-CM

## 2018-01-23 DIAGNOSIS — I10 Essential (primary) hypertension: Secondary | ICD-10-CM

## 2018-01-23 DIAGNOSIS — N1330 Unspecified hydronephrosis: Secondary | ICD-10-CM

## 2018-01-23 DIAGNOSIS — Z5111 Encounter for antineoplastic chemotherapy: Secondary | ICD-10-CM

## 2018-01-23 DIAGNOSIS — F419 Anxiety disorder, unspecified: Secondary | ICD-10-CM

## 2018-01-23 DIAGNOSIS — Z87440 Personal history of urinary (tract) infections: Secondary | ICD-10-CM

## 2018-01-23 DIAGNOSIS — I700 Atherosclerosis of aorta: Secondary | ICD-10-CM | POA: Diagnosis not present

## 2018-01-23 DIAGNOSIS — K5900 Constipation, unspecified: Secondary | ICD-10-CM

## 2018-01-23 DIAGNOSIS — R5383 Other fatigue: Secondary | ICD-10-CM

## 2018-01-23 DIAGNOSIS — R5381 Other malaise: Secondary | ICD-10-CM

## 2018-01-23 DIAGNOSIS — M8580 Other specified disorders of bone density and structure, unspecified site: Secondary | ICD-10-CM

## 2018-01-23 DIAGNOSIS — Z8546 Personal history of malignant neoplasm of prostate: Secondary | ICD-10-CM

## 2018-01-23 DIAGNOSIS — Z880 Allergy status to penicillin: Secondary | ICD-10-CM | POA: Diagnosis not present

## 2018-01-23 DIAGNOSIS — C679 Malignant neoplasm of bladder, unspecified: Secondary | ICD-10-CM

## 2018-01-23 DIAGNOSIS — Z7189 Other specified counseling: Secondary | ICD-10-CM

## 2018-01-23 DIAGNOSIS — Z923 Personal history of irradiation: Secondary | ICD-10-CM

## 2018-01-23 LAB — CBC WITH DIFFERENTIAL/PLATELET
Basophils Absolute: 0.1 10*3/uL (ref 0–0.1)
Basophils Relative: 0 %
Eosinophils Absolute: 0.3 10*3/uL (ref 0–0.7)
Eosinophils Relative: 3 %
HEMATOCRIT: 35.3 % — AB (ref 40.0–52.0)
HEMOGLOBIN: 11.9 g/dL — AB (ref 13.0–18.0)
LYMPHS ABS: 2.1 10*3/uL (ref 1.0–3.6)
Lymphocytes Relative: 17 %
MCH: 29.4 pg (ref 26.0–34.0)
MCHC: 33.6 g/dL (ref 32.0–36.0)
MCV: 87.6 fL (ref 80.0–100.0)
MONOS PCT: 7 %
Monocytes Absolute: 0.9 10*3/uL (ref 0.2–1.0)
NEUTROS ABS: 9.1 10*3/uL — AB (ref 1.4–6.5)
NEUTROS PCT: 73 %
Platelets: 333 10*3/uL (ref 150–440)
RBC: 4.04 MIL/uL — ABNORMAL LOW (ref 4.40–5.90)
RDW: 15.8 % — ABNORMAL HIGH (ref 11.5–14.5)
WBC: 12.4 10*3/uL — ABNORMAL HIGH (ref 3.8–10.6)

## 2018-01-23 LAB — COMPREHENSIVE METABOLIC PANEL
ALT: 10 U/L — AB (ref 17–63)
AST: 17 U/L (ref 15–41)
Albumin: 3 g/dL — ABNORMAL LOW (ref 3.5–5.0)
Alkaline Phosphatase: 74 U/L (ref 38–126)
Anion gap: 9 (ref 5–15)
BUN: 18 mg/dL (ref 6–20)
CHLORIDE: 106 mmol/L (ref 101–111)
CO2: 24 mmol/L (ref 22–32)
CREATININE: 0.88 mg/dL (ref 0.61–1.24)
Calcium: 9.3 mg/dL (ref 8.9–10.3)
GFR calc non Af Amer: >60 mL/min (ref >60)
Glucose, Bld: 135 mg/dL — ABNORMAL HIGH (ref 65–99)
Potassium: 3.9 mmol/L (ref 3.5–5.1)
SODIUM: 139 mmol/L (ref 135–145)
Total Bilirubin: 0.4 mg/dL (ref 0.3–1.2)
Total Protein: 7.4 g/dL (ref 6.5–8.1)

## 2018-01-23 MED ORDER — HEPARIN SOD (PORK) LOCK FLUSH 100 UNIT/ML IV SOLN
INTRAVENOUS | Status: AC
  Filled 2018-01-23: qty 5

## 2018-01-23 MED ORDER — SODIUM CHLORIDE 0.9% FLUSH
10.0000 mL | INTRAVENOUS | Status: DC | PRN
  Filled 2018-01-23: qty 10

## 2018-01-23 MED ORDER — HEPARIN SOD (PORK) LOCK FLUSH 100 UNIT/ML IV SOLN
500.0000 [IU] | Freq: Once | INTRAVENOUS | Status: AC | PRN
  Administered 2018-01-23: 500 [IU]

## 2018-01-23 MED ORDER — SODIUM CHLORIDE 0.9 % IV SOLN
Freq: Once | INTRAVENOUS | Status: AC
  Administered 2018-01-23: 10:00:00 via INTRAVENOUS
  Filled 2018-01-23: qty 1000

## 2018-01-23 MED ORDER — SODIUM CHLORIDE 0.9 % IV SOLN
1600.0000 mg | Freq: Once | INTRAVENOUS | Status: AC
  Administered 2018-01-23: 1600 mg via INTRAVENOUS
  Filled 2018-01-23: qty 42.11

## 2018-01-23 MED ORDER — DEXAMETHASONE SODIUM PHOSPHATE 10 MG/ML IJ SOLN
10.0000 mg | Freq: Once | INTRAMUSCULAR | Status: AC
  Administered 2018-01-23: 10 mg via INTRAVENOUS
  Filled 2018-01-23: qty 1

## 2018-01-23 MED ORDER — SODIUM CHLORIDE 0.9 % IV SOLN
190.0000 mg | Freq: Once | INTRAVENOUS | Status: AC
  Administered 2018-01-23: 190 mg via INTRAVENOUS
  Filled 2018-01-23: qty 19

## 2018-01-23 MED ORDER — PALONOSETRON HCL INJECTION 0.25 MG/5ML
0.2500 mg | Freq: Once | INTRAVENOUS | Status: AC
  Administered 2018-01-23: 0.25 mg via INTRAVENOUS
  Filled 2018-01-23: qty 5

## 2018-01-23 MED ORDER — SODIUM CHLORIDE 0.9 % IV SOLN: 10.0000 mg | Freq: Once | INTRAVENOUS | Status: DC

## 2018-01-23 NOTE — Progress Notes (Signed)
No new changes noted today 

## 2018-01-23 NOTE — Progress Notes (Signed)
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  For his prostate cancer he has received IM RT in the past.  He has been seeing Dr. Erlene Quan in the past for his recurrent UTIs as well as hematuria.  CT scan in July 2018 showed bladder wall thickening with perivascular edema and inflammation in the right ureter greater than the left.  Findings were thought to be due to pyelonephritis at that time.  He underwent cystoscopy on 12/14/2017 which showed abnormal looking prostate with necrotic material lining the entire surface area.  Diffuse copious debris within the bladder appearing to be erythematous without discrete bladder tumor but visualization was poor.  He was then admitted to the hospital on 12/26/2017 with symptoms of UTI and sepsis as well as new moderate bilateral hydronephrosis.  CT abdomen and pelvis with contrast on 12/20/2017 again showed market bile bladder wall thickening with perivesicular edema.  Within the lumen of the bladder there is a 93.9 cm soft tissue attenuating filling defect.  This is indeterminate and could represent an area of blood clot versus urothelial lesion.  He underwent repeat cystoscopy with bilateral pyelogram and ureteral stent placement as well as TURBT and TURP.  He was found to have a massive tumor involving the majority of the bladder  with grossly necrotic and calcified material.  There appeared to be multifocal disease with large burden of the left anterior bladder wall extending posteriorly as well as adjacent to the bladder neck and beyond the right hemitrigone.  Very little normal recognizable bladder mucosa remaining.    Biopsy from TURBT and TURP showed: High-grade urothelial carcinoma with extensive necrosis involving both the bladder and the prostate.  CT chest did not reveal any evidence of metastatic disease.  He was also not found to have any regional adenopathy on CT abdomen  Patient was seen by Dr. Erlene Quan and was not deemed to be a surgical candidate.  He has been referred to [place] for definitive treatment options.  Patient lives with his wife at home and ambulates with a cane.  He does need assistance with his ADLs to some extent.  He has not had any falls and he denies any changes in his appetite or unintentional weight loss.  Denies any pain.  Reports some fatigue and occasional problems with constipation.  He has had 3 hospitalizations last year for urinary tract infections and currently has a chronic Foley for the last 1 month  Dr. Donella Stade to decide based on CT simulation if patient can get RT to his bladder. Plan is to proceed with carboplatin/ gemcitabine 2 weeks on and 1 week off for 4-6 cycles. If patient can get RT, I will drop gemcitabine and continue weekly carboplatin during RT. Given patients age, co-morbidities and frailty- I do not think he is a cisplatin candidate  Dr. Erlene Quan performed interval TURBT on 01/15/18 and was able to debulk tumor as much as possible to  reduce tumor burden   Interval history- appetite is good. He feels fatigued. No issues with foley catheter. Denies any fever. He ambulates around the house with the help of cane  ECOG PS- 2 Pain scale- 0 Opioid associated constipation- no  Review of systems- Review of Systems  Constitutional: Positive for malaise/fatigue. Negative for  chills, fever and weight loss.  HENT: Negative for congestion, ear discharge and nosebleeds.   Eyes: Negative for blurred vision.  Respiratory: Negative for cough, hemoptysis, sputum production, shortness of breath and wheezing.   Cardiovascular: Negative for chest pain, palpitations, orthopnea and claudication.  Gastrointestinal: Negative for abdominal pain, blood in stool, constipation, diarrhea, heartburn, melena, nausea and vomiting.  Genitourinary: Negative for dysuria, flank pain, frequency, hematuria and urgency.  Musculoskeletal: Negative for back pain, joint pain and myalgias.  Skin: Negative for rash.  Neurological: Negative for dizziness, tingling, focal weakness, seizures, weakness and headaches.  Endo/Heme/Allergies: Does not bruise/bleed easily.  Psychiatric/Behavioral: Negative for depression and suicidal ideas. The patient does not have insomnia.       Allergies  Allergen Reactions  . Penicillins Other (See Comments)    Caused nervousness as a child. Patient states he took as a teenager w/o problems Has patient had a PCN reaction causing immediate rash, facial/tongue/throat swelling, SOB or lightheadedness with hypotension: No Has patient had a PCN reaction causing severe rash involving mucus membranes or skin necrosis: No Has patient had a PCN reaction that required hospitalization: No Has patient had a PCN reaction occurring within the last 10 years: No If all of the above answers are "NO", then may proceed with     Past Medical History:  Diagnosis Date  . Anxiety   . Arthritis   . Bladder cancer ([place])   . Hypertension   . Prostate cancer ([place])   . Urinary retention 2019   foley catheter place 11/2017  . UTI (urinary tract infection) 2019   frequent UTI's over last year     Past Surgical History:  Procedure Laterality Date  . CARPAL TUNNEL RELEASE Right   . CHOLECYSTECTOMY  2004  . CYSTOSCOPY W/ URETERAL STENT PLACEMENT Bilateral 12/27/2017   Procedure:  CYSTOSCOPY WITH RETROGRADE PYELOGRAM/URETERAL STENT PLACEMENT;  Surgeon: [name], MD;  Location: [facility];  Service: Urology;  Laterality: Bilateral;  . LEG TENDON SURGERY Right 1958  . PORTA CATH INSERTION N/A 01/22/2018   Procedure: PORTA CATH INSERTION;  Surgeon: [name], MD;  Location: [facility];  Service: Cardiovascular;  Laterality: N/A;  . TRANSURETHRAL RESECTION OF BLADDER TUMOR N/A 12/27/2017   Procedure: TRANSURETHRAL RESECTION OF BLADDER TUMOR (TURBT);  Surgeon: [name], MD;  Location: [facility];  Service: Urology;  Laterality: N/A;  . TRANSURETHRAL RESECTION OF BLADDER TUMOR N/A 01/15/2018   Procedure: TRANSURETHRAL RESECTION OF BLADDER TUMOR (TURBT);  Surgeon: [name], MD;  Location: [facility];  Service: Urology;  Laterality: N/A;  Need 2 hrs for this case please    Social History   Socioeconomic History  . Marital status: Married    Spouse name: Not on file  . Number of children: Not on file  . Years of education: Not on file  . Highest education level: Not on file  Social Needs  . Financial resource strain: Not on file  . Food insecurity - worry: Not on file  . Food insecurity - inability: Not on file  . Transportation needs - medical: Not on file  . Transportation needs - non-medical: Not on file  Occupational  History  . Occupation: retired  Tobacco Use  . Smoking status: Never Smoker  . Smokeless tobacco: Never Used  Substance and Sexual Activity  . Alcohol use: No    Alcohol/week: 0.0 oz  . Drug use: No  . Sexual activity: No  Other Topics Concern  . Not on file  Social History Narrative  . Not on file    Family History  Problem Relation Age of Onset  . Cancer Mother   . Chronic Renal Failure Mother   . Heart disease Father      Current Outpatient Medications:  .  acetaminophen (TYLENOL) 500 MG tablet, Take 1,000 mg by mouth 2 (two) times daily as needed for moderate pain or headache. , Disp: , Rfl:  .  CRANBERRY  PO, Take 1 tablet by mouth 2 (two) times daily., Disp: , Rfl:  .  dexamethasone (DECADRON) 4 MG tablet, Take 2 tablets (8 mg total) by mouth daily. Start the day after carboplatin chemotherapy for 2 days. (Patient not taking: Reported on 01/15/2018), Disp: 30 tablet, Rfl: 1 .  finasteride (PROSCAR) 5 MG tablet, Take 1 tablet (5 mg total) by mouth daily., Disp: 90 tablet, Rfl: 3 .  HYDROcodone-acetaminophen (NORCO/VICODIN) 5-325 MG tablet, Take 1 tablet by mouth every 6 (six) hours as needed for severe pain., Disp: 20 tablet, Rfl: 0 .  lidocaine-prilocaine (EMLA) cream, Apply small amount of cream over port site 1 12/ hours before each chemotherapy treatment. Place small piece of saran wrap over cream to protect clothing. (Patient not taking: Reported on 01/15/2018), Disp: 30 g, Rfl: 1 .  lidocaine-prilocaine (EMLA) cream, Apply to affected area once (Patient not taking: Reported on 01/15/2018), Disp: 30 g, Rfl: 3 .  LORazepam (ATIVAN) 0.5 MG tablet, Take 1 tablet (0.5 mg total) by mouth every 6 (six) hours as needed (Nausea or vomiting). (Patient not taking: Reported on 01/15/2018), Disp: 30 tablet, Rfl: 0 .  ondansetron (ZOFRAN) 8 MG tablet, Take 1 tablet (8 mg total) by mouth 2 (two) times daily as needed for refractory nausea / vomiting. Start on day 3 after carboplatin chemo. (Patient not taking: Reported on 01/15/2018), Disp: 30 tablet, Rfl: 1 .  oxybutynin (DITROPAN) 5 MG tablet, Take 1 tablet (5 mg total) by mouth 2 (two) times daily., Disp: 60 tablet, Rfl: 0 .  prochlorperazine (COMPAZINE) 10 MG tablet, Take 1 tablet (10 mg total) by mouth every 6 (six) hours as needed (Nausea or vomiting). (Patient not taking: Reported on 01/15/2018), Disp: 30 tablet, Rfl: 1 .  tamsulosin (FLOMAX) 0.4 MG CAPS capsule, Take 1 capsule (0.4 mg total) by mouth 2 (two) times daily., Disp: 60 capsule, Rfl: 11  Physical exam:  Vitals:   01/23/18 0859  BP: 116/71  Pulse: 99  Resp: 18  Temp: (!) 97.5 F (36.4 C)    TempSrc: Tympanic  Weight: 189 lb (85.7 kg)  Height: 5\' 11"  (1.803 m)   Physical Exam  Constitutional: He is oriented to person, place, and time.  Frail elderly gentleman sitting in a wheelchair  HENT:  Head: Normocephalic and atraumatic.  Eyes: EOM are normal. Pupils are equal, round, and reactive to light.  Neck: Normal range of motion.  Cardiovascular: Normal rate, regular rhythm and normal heart sounds.  Pulmonary/Chest: Effort normal and breath sounds normal.  Abdominal: Soft. Bowel sounds are normal.  Foley catheter in place  Neurological: He is alert and oriented to person, place, and time.  Resting tremors noted in the right upper extremity  Skin: Skin  is warm and dry.     CMP Latest Ref Rng & Units 01/23/2018  Glucose 65 - 99 mg/dL 135(H)  BUN 6 - 20 mg/dL 18  Creatinine 0.61 - 1.24 mg/dL 0.88  Sodium 135 - 145 mmol/L 139  Potassium 3.5 - 5.1 mmol/L 3.9  Chloride 101 - 111 mmol/L 106  CO2 22 - 32 mmol/L 24  Calcium 8.9 - 10.3 mg/dL 9.3  Total Protein 6.5 - 8.1 g/dL 7.4  Total Bilirubin 0.3 - 1.2 mg/dL 0.4  Alkaline Phos 38 - 126 U/L 74  AST 15 - 41 U/L 17  ALT 17 - 63 U/L 10(L)   CBC Latest Ref Rng & Units 01/23/2018  WBC 3.8 - 10.6 K/uL 12.4(H)  Hemoglobin 13.0 - 18.0 g/dL 11.9(L)  Hematocrit 40.0 - 52.0 % 35.3(L)  Platelets 150 - 440 K/uL 333    No images are attached to the encounter.  Ct Chest W Contrast  Result Date: 12/28/2017 CLINICAL DATA:  Bladder cancer.  Staging.  Asymptomatic. EXAM: CT CHEST WITH CONTRAST TECHNIQUE: Multidetector CT imaging of the chest was performed during intravenous contrast administration. CONTRAST:  51mL ISOVUE-300 IOPAMIDOL (ISOVUE-300) INJECTION 61% COMPARISON:  Abdominopelvic CT of 12/26/2017. No prior chest imaging. FINDINGS: Cardiovascular: Advanced aortic and branch vessel atherosclerosis. Normal heart size, without pericardial effusion. Multivessel coronary artery atherosclerosis. No central pulmonary embolism, on this  non-dedicated study. Mediastinum/Nodes: No supraclavicular adenopathy. No mediastinal or hilar adenopathy. Tiny hiatal hernia. Subtle esophageal fluid level, including on image 116/2 Lungs/Pleura: Trace bilateral pleural thickening and fluid. Probable secretions in the dependent lower trachea. Bilateral lower lobe and dependent upper lobe subsegmental atelectasis. Minimal left major fissure thickening. No suspicious pulmonary nodule or mass. Bilateral pleural calcifications, including on image 74/2. Upper Abdomen: Cholecystectomy. Normal imaged portions of the liver, spleen, stomach, pancreas. Bilateral adrenal thickening. Musculoskeletal: Mild bilateral gynecomastia. Advanced degenerate changes of both glenohumeral joints. Moderate diffuse idiopathic skeletal hyperostosis. IMPRESSION: 1.  No acute process or evidence of metastatic disease in the chest. 2. Bilateral pleural calcifications, suggesting asbestos related pleural disease. 3. Tiny hiatal hernia. Esophageal air fluid level suggests dysmotility or gastroesophageal reflux. 4. Coronary artery atherosclerosis. Aortic Atherosclerosis (ICD10-I70.0). 5. Gynecomastia. Electronically Signed   By: Abigail Miyamoto M.D.   On: 12/28/2017 14:44   Ct Renal Stone Study  Result Date: 12/26/2017 CLINICAL DATA:  Penile pain and decreased urinary output. History of chronic UTI. EXAM: CT ABDOMEN AND PELVIS WITHOUT CONTRAST TECHNIQUE: Multidetector CT imaging of the abdomen and pelvis was performed following the standard protocol without IV contrast. COMPARISON:  CT abdomen pelvis dated December 20, 2017. FINDINGS: Lower chest: No acute abnormality. Bilateral gynecomastia. Three-vessel coronary artery atherosclerosis. Hepatobiliary: No focal liver abnormality is seen. Status post cholecystectomy. No biliary dilatation. Pancreas: Unremarkable. No pancreatic ductal dilatation or surrounding inflammatory changes. Spleen: Normal in size without focal abnormality. Adrenals/Urinary  Tract: The adrenal glands are unremarkable. New moderate bilateral hydronephrosis. No renal or ureteral calculi. Foley catheter within the decompressed bladder. Persistent perivesicular inflammatory changes and diffuse bladder wall thickening, with unchanged intermediate density filling defect within the bladder. Stomach/Bowel: Small hiatal hernia. The stomach is otherwise within normal limits. The appendix is normal. No bowel wall thickening, distention, or surrounding inflammatory changes. Vascular/Lymphatic: Mild aortic atherosclerosis. No enlarged abdominal or pelvic lymph nodes. Reproductive: The prostate gland remains enlarged. Other: No free fluid or pneumoperitoneum. Musculoskeletal: No acute or significant osseous findings. Severe degenerative changes of the bilateral hips. Severe osteopenia. IMPRESSION: 1. New moderate bilateral hydronephrosis. Unchanged diffuse bladder  wall thickening and perivesicular inflammatory changes with intermediate soft tissue density filling defect within the bladder concerning for hematoma or urothelial lesion. Correlation with cystoscopy is again advised. 2.  Aortic atherosclerosis (ICD10-I70.0). Electronically Signed   By: Titus Dubin M.D.   On: 12/26/2017 17:55     Assessment and plan- Patient is a 83 y.o. 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 If patient can get RT, I will drop gemcitabine and continue weekly carboplatin during RT. Given patients age, co-morbidities and frailty- I do not think he is a cisplatin candidate.  Counts ok to proceed with chemotherapy today. He gets cycle 1 day 1 of carboplatin AUC 2 and gemcitabine at 800 mg/meter square today. RTC in 1 week with cbc/cmp, MD visit for cycle 1 day 15 of same  chemo.  I explained to the patient the risks and benefits of chemotherapy including all but not limited to nausea, vomiting, low blood counts and risk of infection and hospitalization. Patient understands and agrees to proceed as planned. Patient understands that given the limitation of radiation therapy and extent of the disease- goal of chemotherapy is likely palliative and not curative to control his disease.      Visit Diagnosis 1. Malignant neoplasm of urinary bladder, unspecified site ([place])   2. Goals of care, counseling/discussion   3. Encounter for antineoplastic chemotherapy      Dr. Randa Evens, MD, MPH [facility] at [facility] Pager- [phone] 01/23/2018 8:10 AM

## 2018-01-25 ENCOUNTER — Ambulatory Visit
Admission: RE | Admit: 2018-01-25 | Discharge: 2018-01-25 | Disposition: A | Discharge status: Home / Self Care | Payer: Medicare Other | Source: Ambulatory Visit | Attending: Radiation Oncology | Admitting: Radiation Oncology

## 2018-01-25 DIAGNOSIS — C679 Malignant neoplasm of bladder, unspecified: Secondary | ICD-10-CM | POA: Diagnosis not present

## 2018-01-25 DIAGNOSIS — Z8546 Personal history of malignant neoplasm of prostate: Secondary | ICD-10-CM | POA: Insufficient documentation

## 2018-01-25 DIAGNOSIS — Z510 Encounter for antineoplastic radiation therapy: Secondary | ICD-10-CM | POA: Insufficient documentation

## 2018-01-25 DIAGNOSIS — C688 Malignant neoplasm of overlapping sites of urinary organs: Secondary | ICD-10-CM | POA: Insufficient documentation

## 2018-01-26 ENCOUNTER — Ambulatory Visit (INDEPENDENT_AMBULATORY_CARE_PROVIDER_SITE_OTHER): Payer: Medicare Other

## 2018-01-26 VITALS — BP 101/62 | HR 118 | Ht 71.0 in | Wt 183.0 lb

## 2018-01-26 DIAGNOSIS — C678 Malignant neoplasm of overlapping sites of bladder: Secondary | ICD-10-CM

## 2018-01-26 MED ORDER — MIRABEGRON ER 50 MG PO TB24: 50.0000 mg | ORAL_TABLET | Freq: Every day | ORAL | 3 refills | Status: DC

## 2018-01-26 NOTE — Progress Notes (Signed)
Pt presented today c/o catheter leaking. Pt stated that urine comes out around the foley every once in a while. Pt also stated that oncology told him that if chemo gets onto his skin then it would burn him therefore foley needed to be changed if urine comes out around the foley. Per Dr. Erlene Quan foley does not need to be changed and myrbetriq 50mg  samples were given for bladder spasms. Pt voiced understanding. Script sent to pharmacy for pt.   Blood pressure 101/62, pulse (!) 118, height 5\' 11"  (1.803 m), weight 183 lb (83 kg).

## 2018-01-29 DIAGNOSIS — Z510 Encounter for antineoplastic radiation therapy: Secondary | ICD-10-CM | POA: Diagnosis not present

## 2018-01-29 NOTE — Progress Notes (Signed)
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 For his prostate cancer he has received IM RT in the past. He has been seeing Dr. Erlene Quan in the past for his recurrent UTIs as well as hematuria. CT scan in July 2018 showed bladder wall thickening with perivascular edema and inflammation in the right ureter greater than the left. Findings were thought to be due to pyelonephritis at that time. He underwent cystoscopy on 12/14/2017 which showed abnormal looking prostate with necrotic material lining the entire surface area. Diffuse copious debris within the bladder appearing to be erythematous without discrete bladder tumor but visualization was poor. He was then admitted to the hospital on 12/26/2017 with symptoms of UTI and sepsis as well as new moderate bilateral hydronephrosis.  CT abdomen and pelvis with contrast on 12/20/2017 again showed market bile bladder wall thickening with perivesicular edema. Within the lumen of the bladder there is a 93.9 cm soft tissue attenuating filling defect. This is indeterminate and could represent an area of blood clot versus urothelial lesion.  He underwent repeat cystoscopy with bilateral pyelogram and ureteral stent placement as well as TURBT and TURP. He was found to have a massive tumor involving the majority of the  bladder with grossly necrotic and calcified material. There appeared to be multifocal disease with large burden of the left anterior bladder wall extending posteriorly as well as adjacent to the bladder neck and beyond the right hemitrigone. Very little normal recognizable bladder mucosa remaining.   Biopsy from TURBT and TURP showed: High-grade urothelial carcinoma with extensive necrosisinvolving both the bladder and the prostate.CT chest did not reveal any evidence of metastatic disease. He was also not found to have any regional adenopathy on CT abdomen  Patient was seen by Dr. Erlene Quan and was not deemed to be a surgical candidate. He has been referred to [place] for definitive treatment options.  Patient lives with his wife at home and ambulates with a cane. He does need assistance with his ADLs to some extent. He has not had any falls and he denies any changes in his appetite or unintentional weight loss. Denies any pain. Reports some fatigue and occasional problems with constipation. He has had 3 hospitalizations last year for urinary tract infections and currently has a chronic Foley for the last 1 month  Dr. Donella Stade to decide based on CT simulation if patient can get RT to his bladder. Plan is to proceed with carboplatin/ gemcitabine 2 weeks on and 1 week off for 4-6 cycles. If patient can get RT, I will drop gemcitabine and continue weekly carboplatin during RT. Given patients age, co-morbidities and frailty- I do not think he is a cisplatin candidate  Dr. Erlene Quan performed interval TURBT on 01/15/18 and was able to debulk tumor as much as possible to reduce tumor burden    Interval history-patient reports that he tolerated his cycle 1 of chemotherapy well.  He did not  even realize that he had actually received chemotherapy in terms of side effects.  Denies any nausea vomiting or fatigue.  He was seen by urology as there was some leak around his Foley catheter and is currently on  biometric for the same with improvement of symptoms.  He reports constipation and has not had a bowel movement in 7 days  ECOG PS- 2 Pain scale- 0   Review of systems- Review of Systems  Constitutional: Positive for malaise/fatigue. Negative for chills, fever and weight loss.  HENT: Negative for congestion, ear discharge and nosebleeds.   Eyes: Negative for blurred vision.  Respiratory: Negative for cough, hemoptysis, sputum production, shortness of breath and wheezing.   Cardiovascular: Negative for chest pain, palpitations, orthopnea and claudication.  Gastrointestinal: Positive for constipation. Negative for abdominal pain, blood in stool, diarrhea, heartburn, melena, nausea and vomiting.  Genitourinary: Negative for dysuria, flank pain, frequency, hematuria and urgency.  Musculoskeletal: Negative for back pain, joint pain and myalgias.  Skin: Negative for rash.  Neurological: Negative for dizziness, tingling, focal weakness, seizures, weakness and headaches.  Endo/Heme/Allergies: Does not bruise/bleed easily.  Psychiatric/Behavioral: Negative for depression and suicidal ideas. The patient does not have insomnia.       Allergies  Allergen Reactions  . Penicillins Other (See Comments)    Caused nervousness as a child. Patient states he took as a teenager w/o problems Has patient had a PCN reaction causing immediate rash, facial/tongue/throat swelling, SOB or lightheadedness with hypotension: No Has patient had a PCN reaction causing severe rash involving mucus membranes or skin necrosis: No Has patient had a PCN reaction that required hospitalization: No Has patient had a PCN reaction occurring within the last 10 years: No If all of the above answers are "NO", then may proceed with     Past Medical History:  Diagnosis Date  . Anxiety   . Arthritis   . Bladder cancer ([place])   . Hypertension   . Prostate cancer ([place])   . Urinary retention 2019   foley catheter place 11/2017    . UTI (urinary tract infection) 2019   frequent UTI's over last year     Past Surgical History:  Procedure Laterality Date  . CARPAL TUNNEL RELEASE Right   . CHOLECYSTECTOMY  2004  . CYSTOSCOPY W/ URETERAL STENT PLACEMENT Bilateral 12/27/2017   Procedure: CYSTOSCOPY WITH RETROGRADE PYELOGRAM/URETERAL STENT PLACEMENT;  Surgeon: [name], MD;  Location: [facility];  Service: Urology;  Laterality: Bilateral;  . LEG TENDON SURGERY Right 1958  . PORTA CATH INSERTION N/A 01/22/2018   Procedure: PORTA CATH INSERTION;  Surgeon: [name], MD;  Location: [place] [facility];  Service: Cardiovascular;  Laterality: N/A;  . TRANSURETHRAL RESECTION OF BLADDER TUMOR N/A 12/27/2017   Procedure: TRANSURETHRAL RESECTION OF BLADDER TUMOR (TURBT);  Surgeon: [name], MD;  Location: [facility];  Service: Urology;  Laterality: N/A;  . TRANSURETHRAL RESECTION OF BLADDER TUMOR N/A 01/15/2018   Procedure: TRANSURETHRAL RESECTION OF BLADDER TUMOR (TURBT);  Surgeon: [name], MD;  Location: [facility];  Service: Urology;  Laterality: N/A;  Need 2 hrs for this case please    Social History   Socioeconomic History  . Marital status: Married    Spouse name: Not on file  . Number of children: Not on file  . Years of education: Not on file  . Highest education level: Not on file  Occupational History  . Occupation: retired  Scientific laboratory technician  . Financial resource strain: Not on file  .  Food insecurity:    Worry: Not on file    Inability: Not on file  . Transportation needs:    Medical: Not on file    Non-medical: Not on file  Tobacco Use  . Smoking status: Never Smoker  . Smokeless tobacco: Never Used  Substance and Sexual Activity  . Alcohol use: No    Alcohol/week: 0.0 oz  . Drug use: No  . Sexual activity: Never  Lifestyle  . Physical activity:    Days per week: Not on file    Minutes per session: Not on file  . Stress: Not on file  Relationships  . Social connections:    Talks  on phone: Not on file    Gets together: Not on file    Attends religious service: Not on file    Active member of club or organization: Not on file    Attends meetings of clubs or organizations: Not on file    Relationship status: Not on file  . Intimate partner violence:    Fear of current or ex partner: Not on file    Emotionally abused: Not on file    Physically abused: Not on file    Forced sexual activity: Not on file  Other Topics Concern  . Not on file  Social History Narrative  . Not on file    Family History  Problem Relation Age of Onset  . Cancer Mother   . Chronic Renal Failure Mother   . Heart disease Father      Current Outpatient Medications:  .  acetaminophen (TYLENOL) 500 MG tablet, Take 1,000 mg by mouth 2 (two) times daily as needed for moderate pain or headache. , Disp: , Rfl:  .  CRANBERRY PO, Take 1 tablet by mouth 2 (two) times daily., Disp: , Rfl:  .  dexamethasone (DECADRON) 4 MG tablet, Take 2 tablets (8 mg total) by mouth daily. Start the day after carboplatin chemotherapy for 2 days. (Patient not taking: Reported on 01/15/2018), Disp: 30 tablet, Rfl: 1 .  finasteride (PROSCAR) 5 MG tablet, Take 1 tablet (5 mg total) by mouth daily., Disp: 90 tablet, Rfl: 3 .  HYDROcodone-acetaminophen (NORCO/VICODIN) 5-325 MG tablet, Take 1 tablet by mouth every 6 (six) hours as needed for severe pain. (Patient not taking: Reported on 01/23/2018), Disp: 20 tablet, Rfl: 0 .  lidocaine-prilocaine (EMLA) cream, Apply small amount of cream over port site 1 12/ hours before each chemotherapy treatment. Place small piece of saran wrap over cream to protect clothing. (Patient not taking: Reported on 01/15/2018), Disp: 30 g, Rfl: 1 .  lidocaine-prilocaine (EMLA) cream, Apply to affected area once (Patient not taking: Reported on 01/15/2018), Disp: 30 g, Rfl: 3 .  LORazepam (ATIVAN) 0.5 MG tablet, Take 1 tablet (0.5 mg total) by mouth every 6 (six) hours as needed (Nausea or vomiting).  (Patient not taking: Reported on 01/15/2018), Disp: 30 tablet, Rfl: 0 .  mirabegron ER (MYRBETRIQ) 50 MG TB24 tablet, Take 1 tablet (50 mg total) by mouth daily., Disp: 30 tablet, Rfl: 3 .  ondansetron (ZOFRAN) 8 MG tablet, Take 1 tablet (8 mg total) by mouth 2 (two) times daily as needed for refractory nausea / vomiting. Start on day 3 after carboplatin chemo. (Patient not taking: Reported on 01/15/2018), Disp: 30 tablet, Rfl: 1 .  oxybutynin (DITROPAN) 5 MG tablet, Take 1 tablet (5 mg total) by mouth 2 (two) times daily. (Patient not taking: Reported on 01/23/2018), Disp: 60 tablet, Rfl: 0 .  prochlorperazine (  COMPAZINE) 10 MG tablet, Take 1 tablet (10 mg total) by mouth every 6 (six) hours as needed (Nausea or vomiting). (Patient not taking: Reported on 01/15/2018), Disp: 30 tablet, Rfl: 1 .  tamsulosin (FLOMAX) 0.4 MG CAPS capsule, Take 1 capsule (0.4 mg total) by mouth 2 (two) times daily., Disp: 60 capsule, Rfl: 11  Physical exam:  Vitals:   01/30/18 0941  BP: 111/74  Pulse: 92  Resp: 20  Temp: (!) 97.5 F (36.4 C)  TempSrc: Tympanic  SpO2: 98%  Weight: 186 lb (84.4 kg)  Height: 5\' 11"  (1.803 m)   Physical Exam  Constitutional: He is oriented to person, place, and time.  Frail elderly gentleman sitting in a wheelchair.  Appears in no acute distress  HENT:  Head: Normocephalic and atraumatic.  Eyes: Pupils are equal, round, and reactive to light. EOM are normal.  Neck: Normal range of motion.  Cardiovascular: Normal rate, regular rhythm and normal heart sounds.  Pulmonary/Chest: Effort normal and breath sounds normal.  Abdominal: Soft. Bowel sounds are normal.  Foley catheter in place draining clear urine  Neurological: He is alert and oriented to person, place, and time.  Skin: Skin is warm and dry.     CMP Latest Ref Rng & Units 01/30/2018  Glucose 65 - 99 mg/dL 130(H)  BUN 6 - 20 mg/dL 20  Creatinine 0.61 - 1.24 mg/dL 0.78  Sodium 135 - 145 mmol/L 136  Potassium 3.5 - 5.1  mmol/L 4.1  Chloride 101 - 111 mmol/L 103  CO2 22 - 32 mmol/L 25  Calcium 8.9 - 10.3 mg/dL 8.9  Total Protein 6.5 - 8.1 g/dL 6.8  Total Bilirubin 0.3 - 1.2 mg/dL 0.4  Alkaline Phos 38 - 126 U/L 63  AST 15 - 41 U/L 13(L)  ALT 17 - 63 U/L 12(L)   CBC Latest Ref Rng & Units 01/30/2018  WBC 3.8 - 10.6 K/uL 11.3(H)  Hemoglobin 13.0 - 18.0 g/dL 11.4(L)  Hematocrit 40.0 - 52.0 % 34.1(L)  Platelets 150 - 440 K/uL 214      Assessment and plan- Patient is a 83 y.o. 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  He was supposed to get 2 weeks on 1 week off chemotherapy.  However he is starting radiation treatment on 02/06/2018 for 10 days.  He will therefore proceed with weekly carboplatin AUC 2 for the next 2 weeks.  CBC CMP and weekly carboplatin next week  I will see him back in 2 weeks for weekly carboplatin along with CBC and CMP if his counts permit  Constipation: I have advised the patient to take Miralax and senna for his constipation and if symptoms do not improve in the next 24-48 hours I will add lactulose at that time     Visit Diagnosis 1. Malignant neoplasm of urinary bladder, unspecified site ([place])   2. Encounter for antineoplastic chemotherapy      Dr. Randa Evens, MD, MPH [facility] Pager- [phone] 01/29/2018 1:14 PM

## 2018-01-30 ENCOUNTER — Inpatient Hospital Stay: Payer: Medicare Other

## 2018-01-30 ENCOUNTER — Inpatient Hospital Stay (HOSPITAL_BASED_OUTPATIENT_CLINIC_OR_DEPARTMENT_OTHER): Payer: Medicare Other | Admitting: Oncology

## 2018-01-30 ENCOUNTER — Telehealth: Payer: Self-pay | Admitting: Urology

## 2018-01-30 ENCOUNTER — Telehealth: Payer: Self-pay | Admitting: *Deleted

## 2018-01-30 ENCOUNTER — Encounter: Payer: Self-pay | Admitting: Oncology

## 2018-01-30 VITALS — BP 111/74 | HR 92 | Temp 97.5°F | Resp 20 | Ht 71.0 in | Wt 186.0 lb

## 2018-01-30 DIAGNOSIS — M8580 Other specified disorders of bone density and structure, unspecified site: Secondary | ICD-10-CM

## 2018-01-30 DIAGNOSIS — N1330 Unspecified hydronephrosis: Secondary | ICD-10-CM | POA: Diagnosis not present

## 2018-01-30 DIAGNOSIS — F419 Anxiety disorder, unspecified: Secondary | ICD-10-CM | POA: Diagnosis not present

## 2018-01-30 DIAGNOSIS — Z8546 Personal history of malignant neoplasm of prostate: Secondary | ICD-10-CM

## 2018-01-30 DIAGNOSIS — C679 Malignant neoplasm of bladder, unspecified: Secondary | ICD-10-CM | POA: Diagnosis not present

## 2018-01-30 DIAGNOSIS — Z923 Personal history of irradiation: Secondary | ICD-10-CM

## 2018-01-30 DIAGNOSIS — M1990 Unspecified osteoarthritis, unspecified site: Secondary | ICD-10-CM | POA: Diagnosis not present

## 2018-01-30 DIAGNOSIS — Z880 Allergy status to penicillin: Secondary | ICD-10-CM

## 2018-01-30 DIAGNOSIS — Z5111 Encounter for antineoplastic chemotherapy: Secondary | ICD-10-CM

## 2018-01-30 DIAGNOSIS — Z87440 Personal history of urinary (tract) infections: Secondary | ICD-10-CM

## 2018-01-30 DIAGNOSIS — R5381 Other malaise: Secondary | ICD-10-CM | POA: Diagnosis not present

## 2018-01-30 DIAGNOSIS — K5900 Constipation, unspecified: Secondary | ICD-10-CM

## 2018-01-30 DIAGNOSIS — Z79899 Other long term (current) drug therapy: Secondary | ICD-10-CM

## 2018-01-30 DIAGNOSIS — R5383 Other fatigue: Secondary | ICD-10-CM | POA: Diagnosis not present

## 2018-01-30 DIAGNOSIS — I700 Atherosclerosis of aorta: Secondary | ICD-10-CM | POA: Diagnosis not present

## 2018-01-30 DIAGNOSIS — I10 Essential (primary) hypertension: Secondary | ICD-10-CM | POA: Diagnosis not present

## 2018-01-30 LAB — CBC WITH DIFFERENTIAL/PLATELET
Basophils Absolute: 0 10*3/uL (ref 0–0.1)
Basophils Relative: 0 %
EOS ABS: 0.2 10*3/uL (ref 0–0.7)
Eosinophils Relative: 2 %
HCT: 34.1 % — ABNORMAL LOW (ref 40.0–52.0)
Hemoglobin: 11.4 g/dL — ABNORMAL LOW (ref 13.0–18.0)
LYMPHS ABS: 2.1 10*3/uL (ref 1.0–3.6)
Lymphocytes Relative: 19 %
MCH: 29.1 pg (ref 26.0–34.0)
MCHC: 33.5 g/dL (ref 32.0–36.0)
MCV: 86.8 fL (ref 80.0–100.0)
MONO ABS: 0.5 10*3/uL (ref 0.2–1.0)
MONOS PCT: 5 %
Neutro Abs: 8.4 10*3/uL — ABNORMAL HIGH (ref 1.4–6.5)
Neutrophils Relative %: 74 %
PLATELETS: 214 10*3/uL (ref 150–440)
RBC: 3.93 MIL/uL — ABNORMAL LOW (ref 4.40–5.90)
RDW: 15.3 % — AB (ref 11.5–14.5)
WBC: 11.3 10*3/uL — ABNORMAL HIGH (ref 3.8–10.6)

## 2018-01-30 LAB — COMPREHENSIVE METABOLIC PANEL
ALT: 12 U/L — AB (ref 17–63)
AST: 13 U/L — AB (ref 15–41)
Albumin: 3 g/dL — ABNORMAL LOW (ref 3.5–5.0)
Alkaline Phosphatase: 63 U/L (ref 38–126)
Anion gap: 8 (ref 5–15)
BUN: 20 mg/dL (ref 6–20)
CHLORIDE: 103 mmol/L (ref 101–111)
CO2: 25 mmol/L (ref 22–32)
CREATININE: 0.78 mg/dL (ref 0.61–1.24)
Calcium: 8.9 mg/dL (ref 8.9–10.3)
GFR calc Af Amer: >60 mL/min (ref >60)
GFR calc non Af Amer: >60 mL/min (ref >60)
Glucose, Bld: 130 mg/dL — ABNORMAL HIGH (ref 65–99)
Potassium: 4.1 mmol/L (ref 3.5–5.1)
Sodium: 136 mmol/L (ref 135–145)
Total Bilirubin: 0.4 mg/dL (ref 0.3–1.2)
Total Protein: 6.8 g/dL (ref 6.5–8.1)

## 2018-01-30 MED ORDER — POLYETHYLENE GLYCOL 3350 17 GM/SCOOP PO POWD: 17.0000 g | Freq: Every day | ORAL | 0 refills | Status: AC | PRN

## 2018-01-30 MED ORDER — SODIUM CHLORIDE 0.9 % IV SOLN
Freq: Once | INTRAVENOUS | Status: AC
  Administered 2018-01-30: 10:00:00 via INTRAVENOUS
  Filled 2018-01-30: qty 1000

## 2018-01-30 MED ORDER — SODIUM CHLORIDE 0.9 % IV SOLN
190.0000 mg | Freq: Once | INTRAVENOUS | Status: AC
  Administered 2018-01-30: 190 mg via INTRAVENOUS
  Filled 2018-01-30: qty 19

## 2018-01-30 MED ORDER — SENNOSIDES-DOCUSATE SODIUM 8.6-50 MG PO TABS: 1.0000 | ORAL_TABLET | Freq: Two times a day (BID) | ORAL | 0 refills | Status: DC

## 2018-01-30 MED ORDER — GEMCITABINE HCL CHEMO INJECTION 1 GM/26.3ML
1600.0000 mg | Freq: Once | INTRAVENOUS | Status: AC
  Administered 2018-01-30: 1600 mg via INTRAVENOUS
  Filled 2018-01-30: qty 26.3

## 2018-01-30 MED ORDER — HEPARIN SOD (PORK) LOCK FLUSH 100 UNIT/ML IV SOLN
500.0000 [IU] | Freq: Once | INTRAVENOUS | Status: AC | PRN
  Administered 2018-01-30: 500 [IU]
  Filled 2018-01-30: qty 5

## 2018-01-30 MED ORDER — PROCHLORPERAZINE MALEATE 10 MG PO TABS
10.0000 mg | ORAL_TABLET | Freq: Once | ORAL | Status: AC
  Administered 2018-01-30: 10 mg via ORAL
  Filled 2018-01-30: qty 1

## 2018-01-30 NOTE — Telephone Encounter (Signed)
Called pt to let him know that I was sending in otc meds as a rx to his pharmacy.  His son answered and said that they use [facility] on [address]. I sent senokot and miralax to pharmacy. I called pharmacy and they said they would got and get it otc and charge them the price of the otc on shelf but would not make them pay taxes on it.

## 2018-01-30 NOTE — Progress Notes (Signed)
Constipation x 7 days

## 2018-01-30 NOTE — Addendum Note (Signed)
Addended by: Luella Cook on: 01/30/2018 02:13 PM   Modules accepted: Orders

## 2018-01-30 NOTE — Telephone Encounter (Signed)
LM for patient to CB to speak with me   Sharyn Lull

## 2018-02-02 ENCOUNTER — Telehealth: Payer: Self-pay | Admitting: *Deleted

## 2018-02-02 NOTE — Telephone Encounter (Signed)
Pt's wife called to say that patient still not had BM. Did have a few round balls.  Grant daughter did not give him Miralax yest. And he was given some last night. I told her to take a dose today- this am and then at lunch time if no results and call me this afternoon. I told her if this does not work she will need to go and get magnesium citrate. I asked her not to get it yet. Try the 2 doses of miralax. Wife will give me a call this afternoon

## 2018-02-05 ENCOUNTER — Ambulatory Visit
Admission: RE | Admit: 2018-02-05 | Discharge: 2018-02-05 | Disposition: A | Discharge status: Home / Self Care | Payer: Medicare Other | Source: Ambulatory Visit | Attending: Radiation Oncology | Admitting: Radiation Oncology

## 2018-02-05 DIAGNOSIS — Z510 Encounter for antineoplastic radiation therapy: Secondary | ICD-10-CM | POA: Insufficient documentation

## 2018-02-05 DIAGNOSIS — C679 Malignant neoplasm of bladder, unspecified: Secondary | ICD-10-CM | POA: Insufficient documentation

## 2018-02-06 ENCOUNTER — Inpatient Hospital Stay: Payer: Medicare Other

## 2018-02-06 ENCOUNTER — Ambulatory Visit
Admission: RE | Admit: 2018-02-06 | Discharge: 2018-02-06 | Disposition: A | Discharge status: Home / Self Care | Payer: Medicare Other | Source: Ambulatory Visit | Attending: Radiation Oncology | Admitting: Radiation Oncology

## 2018-02-06 ENCOUNTER — Inpatient Hospital Stay: Payer: Medicare Other | Attending: Oncology

## 2018-02-06 ENCOUNTER — Other Ambulatory Visit: Payer: Self-pay | Admitting: Oncology

## 2018-02-06 VITALS — BP 127/69 | HR 76 | Temp 95.7°F | Resp 18 | Wt 183.0 lb

## 2018-02-06 DIAGNOSIS — R5383 Other fatigue: Secondary | ICD-10-CM | POA: Diagnosis not present

## 2018-02-06 DIAGNOSIS — C679 Malignant neoplasm of bladder, unspecified: Secondary | ICD-10-CM | POA: Insufficient documentation

## 2018-02-06 DIAGNOSIS — Z880 Allergy status to penicillin: Secondary | ICD-10-CM | POA: Insufficient documentation

## 2018-02-06 DIAGNOSIS — Z510 Encounter for antineoplastic radiation therapy: Secondary | ICD-10-CM | POA: Diagnosis present

## 2018-02-06 DIAGNOSIS — I10 Essential (primary) hypertension: Secondary | ICD-10-CM | POA: Insufficient documentation

## 2018-02-06 DIAGNOSIS — Z923 Personal history of irradiation: Secondary | ICD-10-CM | POA: Diagnosis not present

## 2018-02-06 DIAGNOSIS — Z5111 Encounter for antineoplastic chemotherapy: Secondary | ICD-10-CM | POA: Insufficient documentation

## 2018-02-06 DIAGNOSIS — M1990 Unspecified osteoarthritis, unspecified site: Secondary | ICD-10-CM | POA: Insufficient documentation

## 2018-02-06 DIAGNOSIS — R5381 Other malaise: Secondary | ICD-10-CM | POA: Diagnosis not present

## 2018-02-06 DIAGNOSIS — Z79899 Other long term (current) drug therapy: Secondary | ICD-10-CM | POA: Insufficient documentation

## 2018-02-06 DIAGNOSIS — Z87440 Personal history of urinary (tract) infections: Secondary | ICD-10-CM | POA: Insufficient documentation

## 2018-02-06 DIAGNOSIS — Z8546 Personal history of malignant neoplasm of prostate: Secondary | ICD-10-CM | POA: Insufficient documentation

## 2018-02-06 LAB — COMPREHENSIVE METABOLIC PANEL
ALK PHOS: 58 U/L (ref 38–126)
ALT: 9 U/L — AB (ref 17–63)
ANION GAP: 9 (ref 5–15)
AST: 15 U/L (ref 15–41)
Albumin: 2.9 g/dL — ABNORMAL LOW (ref 3.5–5.0)
BILIRUBIN TOTAL: 0.4 mg/dL (ref 0.3–1.2)
BUN: 19 mg/dL (ref 6–20)
CALCIUM: 8.5 mg/dL — AB (ref 8.9–10.3)
CO2: 22 mmol/L (ref 22–32)
CREATININE: 0.79 mg/dL (ref 0.61–1.24)
Chloride: 105 mmol/L (ref 101–111)
GFR calc non Af Amer: >60 mL/min (ref >60)
GLUCOSE: 136 mg/dL — AB (ref 65–99)
Potassium: 3.5 mmol/L (ref 3.5–5.1)
Sodium: 136 mmol/L (ref 135–145)
TOTAL PROTEIN: 6.3 g/dL — AB (ref 6.5–8.1)

## 2018-02-06 LAB — CBC WITH DIFFERENTIAL/PLATELET
BASOS ABS: 0 10*3/uL (ref 0–0.1)
BASOS PCT: 0 %
EOS ABS: 0.1 10*3/uL (ref 0–0.7)
EOS PCT: 2 %
HCT: 31.7 % — ABNORMAL LOW (ref 40.0–52.0)
Hemoglobin: 10.7 g/dL — ABNORMAL LOW (ref 13.0–18.0)
Lymphocytes Relative: 33 %
Lymphs Abs: 1.5 10*3/uL (ref 1.0–3.6)
MCH: 29.3 pg (ref 26.0–34.0)
MCHC: 33.6 g/dL (ref 32.0–36.0)
MCV: 87 fL (ref 80.0–100.0)
MONOS PCT: 8 %
Monocytes Absolute: 0.4 10*3/uL (ref 0.2–1.0)
Neutro Abs: 2.6 10*3/uL (ref 1.4–6.5)
Neutrophils Relative %: 57 %
PLATELETS: 88 10*3/uL — AB (ref 150–440)
RBC: 3.65 MIL/uL — ABNORMAL LOW (ref 4.40–5.90)
RDW: 15.3 % — AB (ref 11.5–14.5)
WBC: 4.6 10*3/uL (ref 3.8–10.6)

## 2018-02-06 MED ORDER — SODIUM CHLORIDE 0.9% FLUSH
10.0000 mL | INTRAVENOUS | Status: DC | PRN
  Filled 2018-02-06: qty 10

## 2018-02-06 MED ORDER — HEPARIN SOD (PORK) LOCK FLUSH 100 UNIT/ML IV SOLN
500.0000 [IU] | Freq: Once | INTRAVENOUS | Status: DC | PRN
  Filled 2018-02-06: qty 5

## 2018-02-06 MED ORDER — SODIUM CHLORIDE 0.9 % IV SOLN
144.6000 mg | Freq: Once | INTRAVENOUS | Status: AC
  Administered 2018-02-06: 140 mg via INTRAVENOUS
  Filled 2018-02-06: qty 14

## 2018-02-06 MED ORDER — PROCHLORPERAZINE MALEATE 10 MG PO TABS
10.0000 mg | ORAL_TABLET | Freq: Once | ORAL | Status: AC
  Administered 2018-02-06: 10 mg via ORAL
  Filled 2018-02-06: qty 1

## 2018-02-06 MED ORDER — SODIUM CHLORIDE 0.9 % IV SOLN
Freq: Once | INTRAVENOUS | Status: AC
  Administered 2018-02-06: 10:00:00 via INTRAVENOUS
  Filled 2018-02-06: qty 1000

## 2018-02-06 MED ORDER — HEPARIN SOD (PORK) LOCK FLUSH 100 UNIT/ML IV SOLN
500.0000 [IU] | Freq: Once | INTRAVENOUS | Status: AC
  Administered 2018-02-06: 500 [IU] via INTRAVENOUS

## 2018-02-06 MED ORDER — SODIUM CHLORIDE 0.9% FLUSH
10.0000 mL | INTRAVENOUS | Status: DC | PRN
  Administered 2018-02-06: 10 mL via INTRAVENOUS
  Filled 2018-02-06: qty 10

## 2018-02-06 NOTE — Progress Notes (Signed)
platelets 88. pt denies any s/s of bleeding. Pt. does report a "nagging" non productive dry cough, "could be allergies" per pt. Judeen Hammans RN aware. Per Judeen Hammans RN per Dr. [name], okay to proceed with a reduced dose of carboplatin today and okay for pt to start allergy OTC medication.  Pt educated to watch for s/s of abnormal bleeding and to call clinic if any s/s arise. Pt verbalizes understanding.

## 2018-02-07 ENCOUNTER — Ambulatory Visit
Admission: RE | Admit: 2018-02-07 | Discharge: 2018-02-07 | Disposition: A | Discharge status: Home / Self Care | Payer: Medicare Other | Source: Ambulatory Visit | Attending: Radiation Oncology | Admitting: Radiation Oncology

## 2018-02-07 DIAGNOSIS — Z510 Encounter for antineoplastic radiation therapy: Secondary | ICD-10-CM | POA: Diagnosis not present

## 2018-02-08 ENCOUNTER — Ambulatory Visit
Admission: RE | Admit: 2018-02-08 | Discharge: 2018-02-08 | Disposition: A | Discharge status: Home / Self Care | Payer: Medicare Other | Source: Ambulatory Visit | Attending: Radiation Oncology | Admitting: Radiation Oncology

## 2018-02-08 DIAGNOSIS — Z510 Encounter for antineoplastic radiation therapy: Secondary | ICD-10-CM | POA: Diagnosis not present

## 2018-02-09 ENCOUNTER — Ambulatory Visit: Payer: Medicare Other

## 2018-02-12 ENCOUNTER — Ambulatory Visit
Admission: RE | Admit: 2018-02-12 | Discharge: 2018-02-12 | Disposition: A | Discharge status: Home / Self Care | Payer: Medicare Other | Source: Ambulatory Visit | Attending: Radiation Oncology | Admitting: Radiation Oncology

## 2018-02-12 DIAGNOSIS — Z510 Encounter for antineoplastic radiation therapy: Secondary | ICD-10-CM | POA: Diagnosis not present

## 2018-02-13 ENCOUNTER — Inpatient Hospital Stay: Payer: Medicare Other

## 2018-02-13 ENCOUNTER — Ambulatory Visit: Payer: Medicare Other | Admitting: Oncology

## 2018-02-13 ENCOUNTER — Ambulatory Visit
Admission: RE | Admit: 2018-02-13 | Discharge: 2018-02-13 | Disposition: A | Discharge status: Home / Self Care | Payer: Medicare Other | Source: Ambulatory Visit | Attending: Radiation Oncology | Admitting: Radiation Oncology

## 2018-02-13 ENCOUNTER — Other Ambulatory Visit: Payer: Self-pay | Admitting: Oncology

## 2018-02-13 ENCOUNTER — Inpatient Hospital Stay (HOSPITAL_BASED_OUTPATIENT_CLINIC_OR_DEPARTMENT_OTHER): Payer: Medicare Other | Admitting: Oncology

## 2018-02-13 ENCOUNTER — Encounter: Payer: Self-pay | Admitting: Oncology

## 2018-02-13 VITALS — BP 113/72 | HR 91 | Temp 97.6°F | Resp 18 | Ht 71.0 in | Wt 185.5 lb

## 2018-02-13 DIAGNOSIS — Z880 Allergy status to penicillin: Secondary | ICD-10-CM

## 2018-02-13 DIAGNOSIS — Z510 Encounter for antineoplastic radiation therapy: Secondary | ICD-10-CM | POA: Diagnosis not present

## 2018-02-13 DIAGNOSIS — Z923 Personal history of irradiation: Secondary | ICD-10-CM

## 2018-02-13 DIAGNOSIS — R5383 Other fatigue: Secondary | ICD-10-CM | POA: Diagnosis not present

## 2018-02-13 DIAGNOSIS — Z5111 Encounter for antineoplastic chemotherapy: Secondary | ICD-10-CM

## 2018-02-13 DIAGNOSIS — I10 Essential (primary) hypertension: Secondary | ICD-10-CM

## 2018-02-13 DIAGNOSIS — C679 Malignant neoplasm of bladder, unspecified: Secondary | ICD-10-CM

## 2018-02-13 DIAGNOSIS — R5381 Other malaise: Secondary | ICD-10-CM

## 2018-02-13 DIAGNOSIS — Z87440 Personal history of urinary (tract) infections: Secondary | ICD-10-CM

## 2018-02-13 DIAGNOSIS — M1990 Unspecified osteoarthritis, unspecified site: Secondary | ICD-10-CM

## 2018-02-13 DIAGNOSIS — Z8546 Personal history of malignant neoplasm of prostate: Secondary | ICD-10-CM

## 2018-02-13 DIAGNOSIS — Z79899 Other long term (current) drug therapy: Secondary | ICD-10-CM

## 2018-02-13 LAB — CBC WITH DIFFERENTIAL/PLATELET
BASOS ABS: 0.1 10*3/uL (ref 0–0.1)
Basophils Relative: 1 %
Eosinophils Absolute: 0.1 10*3/uL (ref 0–0.7)
Eosinophils Relative: 1 %
HEMATOCRIT: 34.8 % — AB (ref 40.0–52.0)
Hemoglobin: 11.8 g/dL — ABNORMAL LOW (ref 13.0–18.0)
LYMPHS ABS: 1.5 10*3/uL (ref 1.0–3.6)
LYMPHS PCT: 22 %
MCH: 30.1 pg (ref 26.0–34.0)
MCHC: 34 g/dL (ref 32.0–36.0)
MCV: 88.4 fL (ref 80.0–100.0)
Monocytes Absolute: 1 10*3/uL (ref 0.2–1.0)
Monocytes Relative: 14 %
NEUTROS ABS: 4.3 10*3/uL (ref 1.4–6.5)
Neutrophils Relative %: 62 %
Platelets: 412 10*3/uL (ref 150–440)
RBC: 3.94 MIL/uL — ABNORMAL LOW (ref 4.40–5.90)
RDW: 16.1 % — ABNORMAL HIGH (ref 11.5–14.5)
WBC: 6.8 10*3/uL (ref 3.8–10.6)

## 2018-02-13 LAB — COMPREHENSIVE METABOLIC PANEL
ALBUMIN: 3 g/dL — AB (ref 3.5–5.0)
ALT: 9 U/L — ABNORMAL LOW (ref 17–63)
ANION GAP: 8 (ref 5–15)
AST: 15 U/L (ref 15–41)
Alkaline Phosphatase: 71 U/L (ref 38–126)
BILIRUBIN TOTAL: 0.3 mg/dL (ref 0.3–1.2)
BUN: 17 mg/dL (ref 6–20)
CO2: 24 mmol/L (ref 22–32)
Calcium: 8.7 mg/dL — ABNORMAL LOW (ref 8.9–10.3)
Chloride: 104 mmol/L (ref 101–111)
Creatinine, Ser: 0.9 mg/dL (ref 0.61–1.24)
GFR calc Af Amer: >60 mL/min (ref >60)
GLUCOSE: 129 mg/dL — AB (ref 65–99)
POTASSIUM: 3.8 mmol/L (ref 3.5–5.1)
Sodium: 136 mmol/L (ref 135–145)
TOTAL PROTEIN: 6.4 g/dL — AB (ref 6.5–8.1)

## 2018-02-13 MED ORDER — SODIUM CHLORIDE 0.9 % IV SOLN
Freq: Once | INTRAVENOUS | Status: AC
  Administered 2018-02-13: 11:00:00 via INTRAVENOUS
  Filled 2018-02-13: qty 1000

## 2018-02-13 MED ORDER — SODIUM CHLORIDE 0.9 % IV SOLN: 10.0000 mg | Freq: Once | INTRAVENOUS | Status: DC

## 2018-02-13 MED ORDER — DEXAMETHASONE SODIUM PHOSPHATE 10 MG/ML IJ SOLN
10.0000 mg | Freq: Once | INTRAMUSCULAR | Status: AC
  Administered 2018-02-13: 10 mg via INTRAVENOUS
  Filled 2018-02-13: qty 1

## 2018-02-13 MED ORDER — HEPARIN SOD (PORK) LOCK FLUSH 100 UNIT/ML IV SOLN
500.0000 [IU] | Freq: Once | INTRAVENOUS | Status: AC | PRN
  Administered 2018-02-13: 500 [IU]
  Filled 2018-02-13: qty 5

## 2018-02-13 MED ORDER — PALONOSETRON HCL INJECTION 0.25 MG/5ML
0.2500 mg | Freq: Once | INTRAVENOUS | Status: AC
  Administered 2018-02-13: 0.25 mg via INTRAVENOUS
  Filled 2018-02-13: qty 5

## 2018-02-13 MED ORDER — SODIUM CHLORIDE 0.9 % IV SOLN
192.8000 mg | Freq: Once | INTRAVENOUS | Status: AC
  Administered 2018-02-13: 190 mg via INTRAVENOUS
  Filled 2018-02-13: qty 19

## 2018-02-13 NOTE — Progress Notes (Signed)
No new changes noted today 

## 2018-02-13 NOTE — Progress Notes (Signed)
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 For his prostate cancer he has received IM RT in the past. He has been seeing Dr. Erlene Quan in the past for his recurrent UTIs as well as hematuria. CT scan in July 2018 showed bladder wall thickening with perivascular edema and inflammation in the right ureter greater than the left. Findings were thought to be due to pyelonephritis at that time. He underwent cystoscopy on 12/14/2017 which showed abnormal looking prostate with necrotic material lining the entire surface area. Diffuse copious debris within the bladder appearing to be erythematous without discrete bladder tumor but visualization was poor. He was then admitted to the hospital on 12/26/2017 with symptoms of UTI and sepsis as well as new moderate bilateral hydronephrosis.  CT abdomen and pelvis with contrast on 12/20/2017 again showed market bile bladder wall thickening with perivesicular edema. Within the lumen of the bladder there is a 93.9 cm soft tissue attenuating filling defect. This is indeterminate and could represent an area of blood clot versus urothelial lesion.  He underwent repeat cystoscopy with bilateral pyelogram and ureteral stent placement as well as TURBT and TURP. He was found to have a massive tumor involving the majority of the bladder with  grossly necrotic and calcified material. There appeared to be multifocal disease with large burden of the left anterior bladder wall extending posteriorly as well as adjacent to the bladder neck and beyond the right hemitrigone. Very little normal recognizable bladder mucosa remaining.   Biopsy from TURBT and TURP showed: High-grade urothelial carcinoma with extensive necrosisinvolving both the bladder and the prostate.CT chest did not reveal any evidence of metastatic disease. He was also not found to have any regional adenopathy on CT abdomen  Patient was seen by Dr. Erlene Quan and was not deemed to be a surgical candidate. He has been referred to [place] for definitive treatment options.  Patient lives with his wife at home and ambulates with a cane. He does need assistance with his ADLs to some extent. He has not had any falls and he denies any changes in his appetite or unintentional weight loss. Denies any pain. Reports some fatigue and occasional problems with constipation. He has had 3 hospitalizations last year for urinary tract infections and currently has a chronic Foley for the last 1 month  Dr. Donella Stade to decide based on CT simulation if patient can get RT to his bladder. Plan is to proceed with carboplatin/ gemcitabine 2 weeks on and 1 week off for 4-6 cycles. If patient can get RT, I will drop gemcitabine and continue weekly carboplatin during RT. Given patients age, co-morbidities and frailty- I do not think he is a cisplatin candidate  Dr. Erlene Quan performed interval TURBT on 01/15/18 and was able to debulk tumor as much as possible to reduce tumor burden    Interval history- most of his family has been sick with nausea, vomiting and fever. Patient does not have any of these complaints. He is tolerating  chemo and Rt well. Has baseline fatigue and spends most of his time in a recliner.   ECOG PS- 2-3 Pain scale- 0 Opioid associated constipation- no  Review of systems-  Review of Systems  Constitutional: Positive for malaise/fatigue. Negative for chills, fever and weight loss.  HENT: Negative for congestion, ear discharge and nosebleeds.   Eyes: Negative for blurred vision.  Respiratory: Negative for cough, hemoptysis, sputum production, shortness of breath and wheezing.   Cardiovascular: Negative for chest pain, palpitations, orthopnea and claudication.  Gastrointestinal: Negative for abdominal pain, blood in stool, constipation, diarrhea, heartburn, melena, nausea and vomiting.  Genitourinary: Negative for dysuria, flank pain, frequency, hematuria and urgency.  Musculoskeletal: Negative for back pain, joint pain and myalgias.  Skin: Negative for rash.  Neurological: Negative for dizziness, tingling, focal weakness, seizures, weakness and headaches.  Endo/Heme/Allergies: Does not bruise/bleed easily.  Psychiatric/Behavioral: Negative for depression and suicidal ideas. The patient does not have insomnia.       Allergies  Allergen Reactions  . Penicillins Other (See Comments)    Caused nervousness as a child. Patient states he took as a teenager w/o problems Has patient had a PCN reaction causing immediate rash, facial/tongue/throat swelling, SOB or lightheadedness with hypotension: No Has patient had a PCN reaction causing severe rash involving mucus membranes or skin necrosis: No Has patient had a PCN reaction that required hospitalization: No Has patient had a PCN reaction occurring within the last 10 years: No If all of the above answers are "NO", then may proceed with     Past Medical History:  Diagnosis Date  . Anxiety   . Arthritis   . Bladder cancer ([place])   . Hypertension   . Prostate cancer ([place])   . Urinary retention 2019   foley catheter place 11/2017  . UTI (urinary tract infection) 2019   frequent UTI's over last year     Past Surgical History:  Procedure Laterality Date  . CARPAL TUNNEL RELEASE Right   . CHOLECYSTECTOMY  2004   . CYSTOSCOPY W/ URETERAL STENT PLACEMENT Bilateral 12/27/2017   Procedure: CYSTOSCOPY WITH RETROGRADE PYELOGRAM/URETERAL STENT PLACEMENT;  Surgeon: [name], MD;  Location: [facility];  Service: Urology;  Laterality: Bilateral;  . LEG TENDON SURGERY Right 1958  . PORTA CATH INSERTION N/A 01/22/2018   Procedure: PORTA CATH INSERTION;  Surgeon: [name], MD;  Location: [place] [facility];  Service: Cardiovascular;  Laterality: N/A;  . TRANSURETHRAL RESECTION OF BLADDER TUMOR N/A 12/27/2017   Procedure: TRANSURETHRAL RESECTION OF BLADDER TUMOR (TURBT);  Surgeon: [name], MD;  Location: [facility];  Service: Urology;  Laterality: N/A;  . TRANSURETHRAL RESECTION OF BLADDER TUMOR N/A 01/15/2018   Procedure: TRANSURETHRAL RESECTION OF BLADDER TUMOR (TURBT);  Surgeon: [name], MD;  Location: [facility];  Service: Urology;  Laterality: N/A;  Need 2 hrs for this case please    Social History   Socioeconomic History  . Marital status: Married    Spouse name: Not on file  . Number of children: Not on file  . Years of education: Not on file  . Highest education level: Not on file  Occupational History  . Occupation: retired  Scientific laboratory technician  . Financial resource strain: Not on file  . Food insecurity:    Worry: Not on file    Inability: Not on file  . Transportation needs:    Medical: Not on file    Non-medical: Not on file  Tobacco Use  . Smoking status: Never Smoker  .  Smokeless tobacco: Never Used  Substance and Sexual Activity  . Alcohol use: No    Alcohol/week: 0.0 oz  . Drug use: No  . Sexual activity: Never  Lifestyle  . Physical activity:    Days per week: Not on file    Minutes per session: Not on file  . Stress: Not on file  Relationships  . Social connections:    Talks on phone: Not on file    Gets together: Not on file    Attends religious service: Not on file    Active member of club or organization: Not on file    Attends meetings of clubs or  organizations: Not on file    Relationship status: Not on file  . Intimate partner violence:    Fear of current or ex partner: Not on file    Emotionally abused: Not on file    Physically abused: Not on file    Forced sexual activity: Not on file  Other Topics Concern  . Not on file  Social History Narrative  . Not on file    Family History  Problem Relation Age of Onset  . Cancer Mother   . Chronic Renal Failure Mother   . Heart disease Father      Current Outpatient Medications:  .  acetaminophen (TYLENOL) 500 MG tablet, Take 1,000 mg by mouth 2 (two) times daily as needed for moderate pain or headache. , Disp: , Rfl:  .  CRANBERRY PO, Take 1 tablet by mouth 2 (two) times daily., Disp: , Rfl:  .  dexamethasone (DECADRON) 4 MG tablet, Take 2 tablets (8 mg total) by mouth daily. Start the day after carboplatin chemotherapy for 2 days., Disp: 30 tablet, Rfl: 1 .  finasteride (PROSCAR) 5 MG tablet, Take 1 tablet (5 mg total) by mouth daily., Disp: 90 tablet, Rfl: 3 .  HYDROcodone-acetaminophen (NORCO/VICODIN) 5-325 MG tablet, Take 1 tablet by mouth every 6 (six) hours as needed for severe pain., Disp: 20 tablet, Rfl: 0 .  lidocaine-prilocaine (EMLA) cream, Apply small amount of cream over port site 1 12/ hours before each chemotherapy treatment. Place small piece of saran wrap over cream to protect clothing., Disp: 30 g, Rfl: 1 .  lidocaine-prilocaine (EMLA) cream, Apply to affected area once, Disp: 30 g, Rfl: 3 .  LORazepam (ATIVAN) 0.5 MG tablet, Take 1 tablet (0.5 mg total) by mouth every 6 (six) hours as needed (Nausea or vomiting)., Disp: 30 tablet, Rfl: 0 .  mirabegron ER (MYRBETRIQ) 50 MG TB24 tablet, Take 1 tablet (50 mg total) by mouth daily., Disp: 30 tablet, Rfl: 3 .  ondansetron (ZOFRAN) 8 MG tablet, Take 1 tablet (8 mg total) by mouth 2 (two) times daily as needed for refractory nausea / vomiting. Start on day 3 after carboplatin chemo., Disp: 30 tablet, Rfl: 1 .   oxybutynin (DITROPAN) 5 MG tablet, Take 1 tablet (5 mg total) by mouth 2 (two) times daily., Disp: 60 tablet, Rfl: 0 .  polyethylene glycol powder (MIRALAX) powder, Take 17 g by mouth daily as needed. Can increase to 3 times a day as needed for constipation but hold medication if has diarrhea, Disp: 255 g, Rfl: 0 .  prochlorperazine (COMPAZINE) 10 MG tablet, Take 1 tablet (10 mg total) by mouth every 6 (six) hours as needed (Nausea or vomiting)., Disp: 30 tablet, Rfl: 1 .  senna-docusate (SENOKOT-S) 8.6-50 MG tablet, Take 1 tablet by mouth 2 (two) times daily., Disp: 60 tablet, Rfl: 0 .  tamsulosin (  FLOMAX) 0.4 MG CAPS capsule, Take 1 capsule (0.4 mg total) by mouth 2 (two) times daily., Disp: 60 capsule, Rfl: 11  Physical exam:  Vitals:   02/13/18 0944 02/13/18 0946  BP: 113/72   Pulse: 91   Resp: 18   Temp: 97.6 F (36.4 C)   TempSrc: Tympanic   SpO2:  98%  Weight: 185 lb 8 oz (84.1 kg)   Height: 5\' 11"  (1.803 m)    Physical Exam  Constitutional: He is oriented to person, place, and time.  Elderly frail gentleman in a wheelchair. Appears in no acute distress  HENT:  Head: Normocephalic and atraumatic.  Eyes: Pupils are equal, round, and reactive to light. EOM are normal.  Neck: Normal range of motion.  Cardiovascular: Normal rate, regular rhythm and normal heart sounds.  Pulmonary/Chest: Effort normal and breath sounds normal.  Abdominal: Soft. Bowel sounds are normal.  Foley draining clear urine  Neurological: He is alert and oriented to person, place, and time.  Skin: Skin is warm and dry.     CMP Latest Ref Rng & Units 02/13/2018  Glucose 65 - 99 mg/dL 129(H)  BUN 6 - 20 mg/dL 17  Creatinine 0.61 - 1.24 mg/dL 0.90  Sodium 135 - 145 mmol/L 136  Potassium 3.5 - 5.1 mmol/L 3.8  Chloride 101 - 111 mmol/L 104  CO2 22 - 32 mmol/L 24  Calcium 8.9 - 10.3 mg/dL 8.7(L)  Total Protein 6.5 - 8.1 g/dL 6.4(L)  Total Bilirubin 0.3 - 1.2 mg/dL 0.3  Alkaline Phos 38 - 126 U/L 71  AST  15 - 41 U/L 15  ALT 17 - 63 U/L 9(L)   CBC Latest Ref Rng & Units 02/13/2018  WBC 3.8 - 10.6 K/uL 6.8  Hemoglobin 13.0 - 18.0 g/dL 11.8(L)  Hematocrit 40.0 - 52.0 % 34.8(L)  Platelets 150 - 440 K/uL 412     Assessment and plan- Patient is a 83 y.o. 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 He is finishing RT on 02/20/18 and will receive 2 weeks fo radiation.  He will get next week off from chemotherapy. I will see him back on 02/27/18 for cycle 3 day 1 of carboplatin gemcitabine. He will get chemo 2 weeks on 1 week off. I will plan to give him total of 4 cycles followed by repeat scans  He is seeing Dr. Erlene Quan next week.  He will call us if he has any questions or concerns   Visit Diagnosis 1. Malignant neoplasm of urinary bladder, unspecified site ([place])   2. Encounter for antineoplastic chemotherapy      Dr. Randa Evens, MD, MPH [facility] [phone] 02/13/2018 10:11 AM

## 2018-02-13 NOTE — Progress Notes (Signed)
Patient getting Vincent Poole and gemzar, however pt is receiving carboplatin only for 2 cycles due to receiving radiation for 2 cycles, then back to carb and gemzar. Carboplatin dose reduced to auc of 1.5 for previous cycle only, and now back to goal AUC of 2.

## 2018-02-14 ENCOUNTER — Ambulatory Visit
Admission: RE | Admit: 2018-02-14 | Discharge: 2018-02-14 | Disposition: A | Discharge status: Home / Self Care | Payer: Medicare Other | Source: Ambulatory Visit | Attending: Radiation Oncology | Admitting: Radiation Oncology

## 2018-02-14 DIAGNOSIS — Z510 Encounter for antineoplastic radiation therapy: Secondary | ICD-10-CM | POA: Diagnosis not present

## 2018-02-15 ENCOUNTER — Ambulatory Visit
Admission: RE | Admit: 2018-02-15 | Discharge: 2018-02-15 | Disposition: A | Discharge status: Home / Self Care | Payer: Medicare Other | Source: Ambulatory Visit | Attending: Radiation Oncology | Admitting: Radiation Oncology

## 2018-02-15 DIAGNOSIS — Z510 Encounter for antineoplastic radiation therapy: Secondary | ICD-10-CM | POA: Diagnosis not present

## 2018-02-16 ENCOUNTER — Ambulatory Visit
Admission: RE | Admit: 2018-02-16 | Discharge: 2018-02-16 | Disposition: A | Discharge status: Home / Self Care | Payer: Medicare Other | Source: Ambulatory Visit | Attending: Radiation Oncology | Admitting: Radiation Oncology

## 2018-02-16 DIAGNOSIS — Z510 Encounter for antineoplastic radiation therapy: Secondary | ICD-10-CM | POA: Diagnosis not present

## 2018-02-19 ENCOUNTER — Ambulatory Visit
Admission: RE | Admit: 2018-02-19 | Discharge: 2018-02-19 | Disposition: A | Discharge status: Home / Self Care | Payer: Medicare Other | Source: Ambulatory Visit | Attending: Radiation Oncology | Admitting: Radiation Oncology

## 2018-02-19 ENCOUNTER — Ambulatory Visit: Payer: Medicare Other

## 2018-02-19 DIAGNOSIS — Z510 Encounter for antineoplastic radiation therapy: Secondary | ICD-10-CM | POA: Diagnosis not present

## 2018-02-20 ENCOUNTER — Ambulatory Visit: Payer: Medicare Other | Admitting: Oncology

## 2018-02-20 ENCOUNTER — Other Ambulatory Visit: Payer: Medicare Other

## 2018-02-20 ENCOUNTER — Ambulatory Visit
Admission: RE | Admit: 2018-02-20 | Discharge: 2018-02-20 | Disposition: A | Discharge status: Home / Self Care | Payer: Medicare Other | Source: Ambulatory Visit | Attending: Radiation Oncology | Admitting: Radiation Oncology

## 2018-02-20 ENCOUNTER — Ambulatory Visit: Payer: Medicare Other

## 2018-02-20 DIAGNOSIS — Z510 Encounter for antineoplastic radiation therapy: Secondary | ICD-10-CM | POA: Diagnosis not present

## 2018-02-22 ENCOUNTER — Ambulatory Visit: Payer: Medicare Other | Admitting: Urology

## 2018-02-22 ENCOUNTER — Encounter: Payer: Self-pay | Admitting: Urology

## 2018-02-22 VITALS — BP 101/66 | HR 121 | Ht 71.0 in | Wt 181.0 lb

## 2018-02-22 DIAGNOSIS — C678 Malignant neoplasm of overlapping sites of bladder: Secondary | ICD-10-CM | POA: Diagnosis not present

## 2018-02-22 NOTE — Progress Notes (Signed)
02/22/2018 9:46 AM   Vincent Poole [birth year] [id number]  Referring provider: [name], MD [facility] [place], [place] [address]  No chief complaint on file.   HPI: 83 year old male with a history of recurrent UTIs, urinary retention, history of prostate cancer status post IM RT now dx with T4 bladder cancer with extension involving the prostate and likely locally invasive disease beyond the bladder serosa based on imaging.    He is currently undergoing treatment with carboplatin/  Gemcitabine with Dr. [name] the cancer center as well as completing XRT with Dr. Baruch Gouty.  Due to his personal history of previous pelvic radiation, he is only able to receive 4000 cGy.  Overall, he is doing exceptionally well today.   His energy level and appetite are improving despite undergoing chemotherapy.  He is ambulating more easily.  His Foley continues to drain clear yellow urine that is debris has stopped.  He is no longer having issues with his catheter clogging.  He is taking Myrbetriq for bladder comfort.  He currently has a Foley catheter in place as well as bilateral indwelling stents for worsening bilateral hydronephrosis.  His Foley catheter is due to be exchanged today.  PMH: Past Medical History:  Diagnosis Date  . Anxiety   . Arthritis   . Bladder cancer ([place])   . Hypertension   . Prostate cancer ([place])   . Urinary retention 2019   foley catheter place 11/2017  . UTI (urinary tract infection) 2019   frequent UTI's over last year    Surgical History: Past Surgical History:  Procedure Laterality Date  . CARPAL TUNNEL RELEASE Right   . CHOLECYSTECTOMY  2004  . CYSTOSCOPY W/ URETERAL STENT PLACEMENT Bilateral 12/27/2017   Procedure: CYSTOSCOPY WITH RETROGRADE PYELOGRAM/URETERAL STENT PLACEMENT;  Surgeon: [name], MD;  Location: [facility];  Service: Urology;  Laterality: Bilateral;  . LEG TENDON SURGERY Right 1958  . PORTA CATH  INSERTION N/A 01/22/2018   Procedure: PORTA CATH INSERTION;  Surgeon: [name], MD;  Location: [place] [facility];  Service: Cardiovascular;  Laterality: N/A;  . TRANSURETHRAL RESECTION OF BLADDER TUMOR N/A 12/27/2017   Procedure: TRANSURETHRAL RESECTION OF BLADDER TUMOR (TURBT);  Surgeon: [name], MD;  Location: [facility];  Service: Urology;  Laterality: N/A;  . TRANSURETHRAL RESECTION OF BLADDER TUMOR N/A 01/15/2018   Procedure: TRANSURETHRAL RESECTION OF BLADDER TUMOR (TURBT);  Surgeon: [name], MD;  Location: [facility];  Service: Urology;  Laterality: N/A;  Need 2 hrs for this case please    Home Medications:  Allergies as of 02/22/2018      Reactions   Penicillins Other (See Comments)   Caused nervousness as a child. Patient states he took as a teenager w/o problems Has patient had a PCN reaction causing immediate rash, facial/tongue/throat swelling, SOB or lightheadedness with hypotension: No Has patient had a PCN reaction causing severe rash involving mucus membranes or skin necrosis: No Has patient had a PCN reaction that required hospitalization: No Has patient had a PCN reaction occurring within the last 10 years: No If all of the above answers are "NO", then may proceed with      Medication List        Accurate as of 02/22/18 11:59 PM. Always use your most recent med list.          acetaminophen 500 MG tablet Commonly known as:  TYLENOL Take 1,000 mg by mouth 2 (two) times daily as needed for moderate  pain or headache.   CRANBERRY PO Take 1 tablet by mouth 2 (two) times daily.   dexamethasone 4 MG tablet Commonly known as:  DECADRON Take 2 tablets (8 mg total) by mouth daily. Start the day after carboplatin chemotherapy for 2 days.   finasteride 5 MG tablet Commonly known as:  PROSCAR Take 1 tablet (5 mg total) by mouth daily.   HYDROcodone-acetaminophen 5-325 MG tablet Commonly known as:  NORCO/VICODIN Take 1 tablet by mouth every 6 (six) hours  as needed for severe pain.   lidocaine-prilocaine cream Commonly known as:  EMLA Apply small amount of cream over port site 1 12/ hours before each chemotherapy treatment. Place small piece of saran wrap over cream to protect clothing.   lidocaine-prilocaine cream Commonly known as:  EMLA Apply to affected area once   LORazepam 0.5 MG tablet Commonly known as:  ATIVAN Take 1 tablet (0.5 mg total) by mouth every 6 (six) hours as needed (Nausea or vomiting).   mirabegron ER 50 MG Tb24 tablet Commonly known as:  MYRBETRIQ Take 1 tablet (50 mg total) by mouth daily.   ondansetron 8 MG tablet Commonly known as:  ZOFRAN TAKE ONE TABLET TWICE DAILY AS NEEDED FOR NAUSEA / VOMITING START ON DAY 3 AFTER CARBOPLATIN CHEMO   oxybutynin 5 MG tablet Commonly known as:  DITROPAN Take 1 tablet (5 mg total) by mouth 2 (two) times daily.   polyethylene glycol powder powder Commonly known as:  MIRALAX Take 17 g by mouth daily as needed. Can increase to 3 times a day as needed for constipation but hold medication if has diarrhea   prochlorperazine 10 MG tablet Commonly known as:  COMPAZINE Take 1 tablet (10 mg total) by mouth every 6 (six) hours as needed (Nausea or vomiting).   senna-docusate 8.6-50 MG tablet Commonly known as:  Senokot-S Take 1 tablet by mouth 2 (two) times daily.   tamsulosin 0.4 MG Caps capsule Commonly known as:  FLOMAX Take 1 capsule (0.4 mg total) by mouth 2 (two) times daily.       Allergies:  Allergies  Allergen Reactions  . Penicillins Other (See Comments)    Caused nervousness as a child. Patient states he took as a teenager w/o problems Has patient had a PCN reaction causing immediate rash, facial/tongue/throat swelling, SOB or lightheadedness with hypotension: No Has patient had a PCN reaction causing severe rash involving mucus membranes or skin necrosis: No Has patient had a PCN reaction that required hospitalization: No Has patient had a PCN reaction  occurring within the last 10 years: No If all of the above answers are "NO", then may proceed with    Family History: Family History  Problem Relation Age of Onset  . Cancer Mother   . Chronic Renal Failure Mother   . Heart disease Father     Social History:  reports that he has never smoked. He has never used smokeless tobacco. He reports that he does not drink alcohol or use drugs.  ROS: UROLOGY Frequent Urination?: No Hard to postpone urination?: No Burning/pain with urination?: No Get up at night to urinate?: No Leakage of urine?: No Urine stream starts and stops?: No Trouble starting stream?: No Do you have to strain to urinate?: No Blood in urine?: No Urinary tract infection?: No Sexually transmitted disease?: No Injury to kidneys or bladder?: No Painful intercourse?: No Weak stream?: No Erection problems?: No Penile pain?: No  Gastrointestinal Nausea?: No Vomiting?: No Indigestion/heartburn?: No Diarrhea?: No Constipation?: No  Constitutional Fever: No Night sweats?: No Weight loss?: No Fatigue?: Yes  Skin Skin rash/lesions?: No Itching?: No  Eyes Blurred vision?: No Double vision?: No  Ears/Nose/Throat Sore throat?: No Sinus problems?: Yes  Hematologic/Lymphatic Swollen glands?: No Easy bruising?: No  Cardiovascular Leg swelling?: No Chest pain?: No  Respiratory Cough?: No Shortness of breath?: No  Endocrine Excessive thirst?: No  Musculoskeletal Back pain?: No Joint pain?: No  Neurological Headaches?: No Dizziness?: No  Psychologic Depression?: No Anxiety?: No  Physical Exam: BP 101/66   Pulse (!) 121   Ht 5\' 11"  (1.803 m)   Wt 181 lb (82.1 kg)   BMI 25.24 kg/m   Constitutional:  Alert and oriented, No acute distress.  Accompanied by wife today.  Ambulating independently today.  Malodorous. HEENT: [place] AT, moist mucus membranes.  Trachea midline, no masses. Cardiovascular: No clubbing, cyanosis, or  edema. Respiratory: Normal respiratory effort, no increased work of breathing. GU: Foley catheter draining clear yellow urine. Neurologic: Grossly intact, no focal deficits, moving all 4 extremities. Psychiatric: Normal mood and affect.  Laboratory Data: Lab Results  Component Value Date   WBC 10.0 02/27/2018   HGB 12.5 (L) 02/27/2018   HCT 36.9 (L) 02/27/2018   MCV 89.0 02/27/2018   PLT 213 02/27/2018    Lab Results  Component Value Date   CREATININE 0.96 02/27/2018   Urinalysis N/a  Pertinent Imaging: No new interval imaging  Assessment & Plan:    1. Malignant neoplasm of overlapping sites of bladder [facility]) Undergoing trimodal therapy for T4 bladder cancer  Not surgical candidate for cystoprostatectomy given poor functional status Foley exchanged today-  we will hold off on voiding trial for now given that he is currently undergoing radiation and chemotherapy, unlikely to pass at this time Indwelling Bard Optima ureteral stents, will consider removal/exchange down the road once he is completed his therapy Plan for cystoscopy in our office in about 2 months to evaluate his bladder, consideration of additional debulking   Return in about 2 months (around 04/24/2018) for possible cysto.   [name], MD  [facility] [address] [place], [place] [address] [phone]

## 2018-02-27 ENCOUNTER — Inpatient Hospital Stay: Payer: Medicare Other

## 2018-02-27 ENCOUNTER — Inpatient Hospital Stay (HOSPITAL_BASED_OUTPATIENT_CLINIC_OR_DEPARTMENT_OTHER): Payer: Medicare Other | Admitting: Oncology

## 2018-02-27 ENCOUNTER — Encounter: Payer: Self-pay | Admitting: Oncology

## 2018-02-27 VITALS — BP 145/81 | HR 103 | Temp 97.9°F | Resp 20 | Ht 71.0 in | Wt 181.8 lb

## 2018-02-27 DIAGNOSIS — R5381 Other malaise: Secondary | ICD-10-CM

## 2018-02-27 DIAGNOSIS — Z87440 Personal history of urinary (tract) infections: Secondary | ICD-10-CM | POA: Diagnosis not present

## 2018-02-27 DIAGNOSIS — Z8546 Personal history of malignant neoplasm of prostate: Secondary | ICD-10-CM

## 2018-02-27 DIAGNOSIS — Z880 Allergy status to penicillin: Secondary | ICD-10-CM | POA: Diagnosis not present

## 2018-02-27 DIAGNOSIS — Z923 Personal history of irradiation: Secondary | ICD-10-CM

## 2018-02-27 DIAGNOSIS — I10 Essential (primary) hypertension: Secondary | ICD-10-CM | POA: Diagnosis not present

## 2018-02-27 DIAGNOSIS — C679 Malignant neoplasm of bladder, unspecified: Secondary | ICD-10-CM

## 2018-02-27 DIAGNOSIS — R5383 Other fatigue: Secondary | ICD-10-CM | POA: Diagnosis not present

## 2018-02-27 DIAGNOSIS — Z79899 Other long term (current) drug therapy: Secondary | ICD-10-CM

## 2018-02-27 DIAGNOSIS — M1990 Unspecified osteoarthritis, unspecified site: Secondary | ICD-10-CM

## 2018-02-27 DIAGNOSIS — Z5111 Encounter for antineoplastic chemotherapy: Secondary | ICD-10-CM

## 2018-02-27 LAB — COMPREHENSIVE METABOLIC PANEL
ALBUMIN: 3.1 g/dL — AB (ref 3.5–5.0)
ALT: 8 U/L — ABNORMAL LOW (ref 17–63)
ANION GAP: 8 (ref 5–15)
AST: 15 U/L (ref 15–41)
Alkaline Phosphatase: 67 U/L (ref 38–126)
BILIRUBIN TOTAL: 0.4 mg/dL (ref 0.3–1.2)
BUN: 21 mg/dL — ABNORMAL HIGH (ref 6–20)
CO2: 25 mmol/L (ref 22–32)
Calcium: 9.1 mg/dL (ref 8.9–10.3)
Chloride: 105 mmol/L (ref 101–111)
Creatinine, Ser: 0.96 mg/dL (ref 0.61–1.24)
Glucose, Bld: 132 mg/dL — ABNORMAL HIGH (ref 65–99)
POTASSIUM: 3.7 mmol/L (ref 3.5–5.1)
Sodium: 138 mmol/L (ref 135–145)
Total Protein: 7 g/dL (ref 6.5–8.1)

## 2018-02-27 LAB — CBC WITH DIFFERENTIAL/PLATELET
BASOS ABS: 0.1 10*3/uL (ref 0–0.1)
Basophils Relative: 1 %
EOS ABS: 0.1 10*3/uL (ref 0–0.7)
EOS PCT: 1 %
HCT: 36.9 % — ABNORMAL LOW (ref 40.0–52.0)
HEMOGLOBIN: 12.5 g/dL — AB (ref 13.0–18.0)
LYMPHS ABS: 2 10*3/uL (ref 1.0–3.6)
LYMPHS PCT: 20 %
MCH: 30.2 pg (ref 26.0–34.0)
MCHC: 34 g/dL (ref 32.0–36.0)
MCV: 89 fL (ref 80.0–100.0)
Monocytes Absolute: 0.8 10*3/uL (ref 0.2–1.0)
Monocytes Relative: 8 %
NEUTROS PCT: 70 %
Neutro Abs: 7.1 10*3/uL — ABNORMAL HIGH (ref 1.4–6.5)
Platelets: 213 10*3/uL (ref 150–440)
RBC: 4.14 MIL/uL — AB (ref 4.40–5.90)
RDW: 18.5 % — AB (ref 11.5–14.5)
WBC: 10 10*3/uL (ref 3.8–10.6)

## 2018-02-27 MED ORDER — DEXAMETHASONE SODIUM PHOSPHATE 10 MG/ML IJ SOLN
10.0000 mg | Freq: Once | INTRAMUSCULAR | Status: AC
  Administered 2018-02-27: 10 mg via INTRAVENOUS
  Filled 2018-02-27: qty 1

## 2018-02-27 MED ORDER — SODIUM CHLORIDE 0.9% FLUSH
10.0000 mL | INTRAVENOUS | Status: DC | PRN
  Administered 2018-02-27: 10 mL via INTRAVENOUS
  Filled 2018-02-27: qty 10

## 2018-02-27 MED ORDER — SODIUM CHLORIDE 0.9 % IV SOLN
1600.0000 mg | Freq: Once | INTRAVENOUS | Status: AC
  Administered 2018-02-27: 1600 mg via INTRAVENOUS
  Filled 2018-02-27: qty 26.3

## 2018-02-27 MED ORDER — SODIUM CHLORIDE 0.9 % IV SOLN
Freq: Once | INTRAVENOUS | Status: AC
  Administered 2018-02-27: 10:00:00 via INTRAVENOUS
  Filled 2018-02-27: qty 1000

## 2018-02-27 MED ORDER — HEPARIN SOD (PORK) LOCK FLUSH 100 UNIT/ML IV SOLN
500.0000 [IU] | Freq: Once | INTRAVENOUS | Status: AC
  Administered 2018-02-27: 500 [IU] via INTRAVENOUS
  Filled 2018-02-27: qty 5

## 2018-02-27 MED ORDER — PALONOSETRON HCL INJECTION 0.25 MG/5ML
0.2500 mg | Freq: Once | INTRAVENOUS | Status: AC
  Administered 2018-02-27: 0.25 mg via INTRAVENOUS
  Filled 2018-02-27: qty 5

## 2018-02-27 MED ORDER — SODIUM CHLORIDE 0.9 % IV SOLN
192.8000 mg | Freq: Once | INTRAVENOUS | Status: AC
  Administered 2018-02-27: 190 mg via INTRAVENOUS
  Filled 2018-02-27: qty 19

## 2018-02-27 NOTE — Progress Notes (Signed)
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 For his prostate cancer he has received IM RT in the past. He has been seeing Dr. Erlene Quan in the past for his recurrent UTIs as well as hematuria. CT scan in July 2018 showed bladder wall thickening with perivascular edema and inflammation in the right ureter greater than the left. Findings were thought to be due to pyelonephritis at that time. He underwent cystoscopy on 12/14/2017 which showed abnormal looking prostate with necrotic material lining the entire surface area. Diffuse copious debris within the bladder appearing to be erythematous without discrete bladder tumor but visualization was poor. He was then admitted to the hospital on 12/26/2017 with symptoms of UTI and sepsis as well as new moderate bilateral hydronephrosis.  CT abdomen and pelvis with contrast on 12/20/2017 again showed market bile bladder wall thickening with perivesicular edema. Within the lumen of the bladder there is a 93.9 cm soft tissue attenuating filling defect. This is indeterminate and could represent an area of blood clot versus urothelial lesion.  He underwent repeat cystoscopy with bilateral pyelogram and ureteral stent placement as well as TURBT and TURP. He was found to have a massive tumor involving the majority of the  bladder with grossly necrotic and calcified material. There appeared to be multifocal disease with large burden of the left anterior bladder wall extending posteriorly as well as adjacent to the bladder neck and beyond the right hemitrigone. Very little normal recognizable bladder mucosa remaining.   Biopsy from TURBT and TURP showed: High-grade urothelial carcinoma with extensive necrosisinvolving both the bladder and the prostate.CT chest did not reveal any evidence of metastatic disease. He was also not found to have any regional adenopathy on CT abdomen  Patient was seen by Dr. Erlene Quan and was not deemed to be a surgical candidate. He has been referred to [place] for definitive treatment options.  Patient lives with his wife at home and ambulates with a cane. He does need assistance with his ADLs to some extent. He has not had any falls and he denies any changes in his appetite or unintentional weight loss. Denies any pain. Reports some fatigue and occasional problems with constipation. He has had 3 hospitalizations last year for urinary tract infections and currently has a chronic Foley for the last 1 month  Dr. Donella Stade to decide based on CT simulation if patient can get RT to his bladder. Plan is to proceed with carboplatin/ gemcitabine 2 weeks on and 1 week off for 4-6 cycles. If patient can get RT, I will drop gemcitabine and continue weekly carboplatin during RT. Given patients age, co-morbidities and frailty- I do not think he is a cisplatin candidate  Dr. Erlene Quan performed interval TURBT on 01/15/18 and was able to debulk tumor as much as possible to reduce tumor burden   Interval history- he feels well today. He is here with his wife and reports no nausea/ vomiting. Has baseline fatigue. Urinary  catheter was changed after his visit with Dr. Erlene Quan. Reports occasional discomfort at the catheter site  ECOG PS- 2 Pain scale- 0 Opioid associated constipation- no  Review of  systems- Review of Systems  Constitutional: Positive for malaise/fatigue. Negative for chills, fever and weight loss.  HENT: Negative for congestion, ear discharge and nosebleeds.   Eyes: Negative for blurred vision.  Respiratory: Negative for cough, hemoptysis, sputum production, shortness of breath and wheezing.   Cardiovascular: Negative for chest pain, palpitations, orthopnea and claudication.  Gastrointestinal: Negative for abdominal pain, blood in stool, constipation, diarrhea, heartburn, melena, nausea and vomiting.  Genitourinary: Negative for dysuria, flank pain, frequency, hematuria and urgency.  Musculoskeletal: Negative for back pain, joint pain and myalgias.  Skin: Negative for rash.  Neurological: Negative for dizziness, tingling, focal weakness, seizures, weakness and headaches.  Endo/Heme/Allergies: Does not bruise/bleed easily.  Psychiatric/Behavioral: Negative for depression and suicidal ideas. The patient does not have insomnia.       Allergies  Allergen Reactions  . Penicillins Other (See Comments)    Caused nervousness as a child. Patient states he took as a teenager w/o problems Has patient had a PCN reaction causing immediate rash, facial/tongue/throat swelling, SOB or lightheadedness with hypotension: No Has patient had a PCN reaction causing severe rash involving mucus membranes or skin necrosis: No Has patient had a PCN reaction that required hospitalization: No Has patient had a PCN reaction occurring within the last 10 years: No If all of the above answers are "NO", then may proceed with     Past Medical History:  Diagnosis Date  . Anxiety   . Arthritis   . Bladder cancer ([place])   . Hypertension   . Prostate cancer ([place])   . Urinary retention 2019   foley catheter place 11/2017  . UTI (urinary tract infection) 2019   frequent UTI's over last year     Past Surgical History:  Procedure Laterality Date  . CARPAL TUNNEL RELEASE Right   .  CHOLECYSTECTOMY  2004  . CYSTOSCOPY W/ URETERAL STENT PLACEMENT Bilateral 12/27/2017   Procedure: CYSTOSCOPY WITH RETROGRADE PYELOGRAM/URETERAL STENT PLACEMENT;  Surgeon: [name], MD;  Location: [facility];  Service: Urology;  Laterality: Bilateral;  . LEG TENDON SURGERY Right 1958  . PORTA CATH INSERTION N/A 01/22/2018   Procedure: PORTA CATH INSERTION;  Surgeon: [name], MD;  Location: [facility];  Service: Cardiovascular;  Laterality: N/A;  . TRANSURETHRAL RESECTION OF BLADDER TUMOR N/A 12/27/2017   Procedure: TRANSURETHRAL RESECTION OF BLADDER TUMOR (TURBT);  Surgeon: [name], MD;  Location: [facility];  Service: Urology;  Laterality: N/A;  . TRANSURETHRAL RESECTION OF BLADDER TUMOR N/A 01/15/2018   Procedure: TRANSURETHRAL RESECTION OF BLADDER TUMOR (TURBT);  Surgeon: [name], MD;  Location: [facility];  Service: Urology;  Laterality: N/A;  Need 2 hrs for this case please    Social History   Socioeconomic History  . Marital status: Married    Spouse name: Not on file  . Number of children: Not on file  . Years of education: Not on file  . Highest education level: Not on file  Occupational History  . Occupation: retired  Scientific laboratory technician  . Financial resource strain: Not on file  . Food insecurity:    Worry: Not on file    Inability: Not on file  . Transportation needs:    Medical: Not on file    Non-medical: Not on file  Tobacco Use  . Smoking status: Never Smoker  .  Smokeless tobacco: Never Used  Substance and Sexual Activity  . Alcohol use: No    Alcohol/week: 0.0 oz  . Drug use: No  . Sexual activity: Never  Lifestyle  . Physical activity:    Days per week: Not on file    Minutes per session: Not on file  . Stress: Not on file  Relationships  . Social connections:    Talks on phone: Not on file    Gets together: Not on file    Attends religious service: Not on file    Active member of club or organization: Not on file    Attends  meetings of clubs or organizations: Not on file    Relationship status: Not on file  . Intimate partner violence:    Fear of current or ex partner: Not on file    Emotionally abused: Not on file    Physically abused: Not on file    Forced sexual activity: Not on file  Other Topics Concern  . Not on file  Social History Narrative  . Not on file    Family History  Problem Relation Age of Onset  . Cancer Mother   . Chronic Renal Failure Mother   . Heart disease Father      Current Outpatient Medications:  .  acetaminophen (TYLENOL) 500 MG tablet, Take 1,000 mg by mouth 2 (two) times daily as needed for moderate pain or headache. , Disp: , Rfl:  .  CRANBERRY PO, Take 1 tablet by mouth 2 (two) times daily., Disp: , Rfl:  .  dexamethasone (DECADRON) 4 MG tablet, Take 2 tablets (8 mg total) by mouth daily. Start the day after carboplatin chemotherapy for 2 days., Disp: 30 tablet, Rfl: 1 .  finasteride (PROSCAR) 5 MG tablet, Take 1 tablet (5 mg total) by mouth daily., Disp: 90 tablet, Rfl: 3 .  lidocaine-prilocaine (EMLA) cream, Apply small amount of cream over port site 1 12/ hours before each chemotherapy treatment. Place small piece of saran wrap over cream to protect clothing., Disp: 30 g, Rfl: 1 .  LORazepam (ATIVAN) 0.5 MG tablet, Take 1 tablet (0.5 mg total) by mouth every 6 (six) hours as needed (Nausea or vomiting)., Disp: 30 tablet, Rfl: 0 .  mirabegron ER (MYRBETRIQ) 50 MG TB24 tablet, Take 1 tablet (50 mg total) by mouth daily., Disp: 30 tablet, Rfl: 3 .  oxybutynin (DITROPAN) 5 MG tablet, Take 1 tablet (5 mg total) by mouth 2 (two) times daily., Disp: 60 tablet, Rfl: 0 .  polyethylene glycol powder (MIRALAX) powder, Take 17 g by mouth daily as needed. Can increase to 3 times a day as needed for constipation but hold medication if has diarrhea, Disp: 255 g, Rfl: 0 .  senna-docusate (SENOKOT-S) 8.6-50 MG tablet, Take 1 tablet by mouth 2 (two) times daily., Disp: 60 tablet, Rfl: 0 .   tamsulosin (FLOMAX) 0.4 MG CAPS capsule, Take 1 capsule (0.4 mg total) by mouth 2 (two) times daily., Disp: 60 capsule, Rfl: 11 .  HYDROcodone-acetaminophen (NORCO/VICODIN) 5-325 MG tablet, Take 1 tablet by mouth every 6 (six) hours as needed for severe pain. (Patient not taking: Reported on 02/27/2018), Disp: 20 tablet, Rfl: 0 .  lidocaine-prilocaine (EMLA) cream, Apply to affected area once (Patient not taking: Reported on 02/27/2018), Disp: 30 g, Rfl: 3 .  ondansetron (ZOFRAN) 8 MG tablet, TAKE ONE TABLET TWICE DAILY AS NEEDED FOR NAUSEA / VOMITING START ON DAY 3 AFTER CARBOPLATIN CHEMO (Patient not taking: Reported on 02/27/2018), Disp: 30  tablet, Rfl: 1 .  prochlorperazine (COMPAZINE) 10 MG tablet, Take 1 tablet (10 mg total) by mouth every 6 (six) hours as needed (Nausea or vomiting). (Patient not taking: Reported on 02/27/2018), Disp: 30 tablet, Rfl: 1 No current facility-administered medications for this visit.   Facility-Administered Medications Ordered in Other Visits:  .  heparin lock flush 100 unit/mL, 500 Units, Intravenous, Once, Sindy Guadeloupe, MD .  sodium chloride flush (NS) 0.9 % injection 10 mL, 10 mL, Intravenous, PRN, Sindy Guadeloupe, MD, 10 mL at 02/27/18 0911  Physical exam:  Vitals:   02/27/18 0924  BP: (!) 145/81  Pulse: (!) 103  Resp: 20  Temp: 97.9 F (36.6 C)  TempSrc: Tympanic  SpO2: 97%  Weight: 181 lb 12.8 oz (82.5 kg)  Height: 5\' 11"  (1.803 m)   Physical Exam  Constitutional: He is oriented to person, place, and time.  Frail elderly gentleman sitting in a wheelchair. No acute distress  HENT:  Head: Normocephalic and atraumatic.  Eyes: Pupils are equal, round, and reactive to light. EOM are normal.  Neck: Normal range of motion.  Cardiovascular: Normal rate, regular rhythm and normal heart sounds.  Pulmonary/Chest: Effort normal and breath sounds normal.  Abdominal: Soft. Bowel sounds are normal.  Foley draining clear urine  Musculoskeletal: He exhibits  no edema.  Neurological: He is alert and oriented to person, place, and time.  Skin: Skin is warm and dry.     CMP Latest Ref Rng & Units 02/27/2018  Glucose 65 - 99 mg/dL 132(H)  BUN 6 - 20 mg/dL 21(H)  Creatinine 0.61 - 1.24 mg/dL 0.96  Sodium 135 - 145 mmol/L 138  Potassium 3.5 - 5.1 mmol/L 3.7  Chloride 101 - 111 mmol/L 105  CO2 22 - 32 mmol/L 25  Calcium 8.9 - 10.3 mg/dL 9.1  Total Protein 6.5 - 8.1 g/dL 7.0  Total Bilirubin 0.3 - 1.2 mg/dL 0.4  Alkaline Phos 38 - 126 U/L 67  AST 15 - 41 U/L 15  ALT 17 - 63 U/L 8(L)   CBC Latest Ref Rng & Units 02/27/2018  WBC 3.8 - 10.6 K/uL 10.0  Hemoglobin 13.0 - 18.0 g/dL 12.5(L)  Hematocrit 40.0 - 52.0 % 36.9(L)  Platelets 150 - 440 K/uL 213      Assessment and plan- Patient is a 83 y.o. 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 chceck cbc/ cmp  RTC in 3 weeks- cbc/ cmp for cycle 4 day 1 of carboplatin/ gemzar  Scans after cycle 4. Dr. Erlene Quan plans to repeat TURP end of may 2019 per patient   Visit Diagnosis 1. Malignant neoplasm of urinary bladder, unspecified site ([place])   2. Encounter for antineoplastic chemotherapy      Dr. Randa Evens, MD, MPH [facility] [phone] 02/27/2018 12:34 PM

## 2018-02-27 NOTE — Progress Notes (Signed)
No new changes noted today 

## 2018-03-05 ENCOUNTER — Ambulatory Visit: Payer: Medicare Other

## 2018-03-05 DIAGNOSIS — N3289 Other specified disorders of bladder: Secondary | ICD-10-CM

## 2018-03-05 NOTE — Progress Notes (Signed)
Patient present today complaining of bladder spasms and leakage around tip of penis. Most of his symptoms happen at night when in bed and seem to be positional. Patient's catheter today is draining fine and does not need irrigation. Patient's wife states she does have the supplies for irrigation at home if need be.  It was explained to patient in detail that having spasms with two stents in, a foley, and post radiation is to be expected and continue taking his myrbetriq to help with the spasms. Patient is to call if cath stops draining or if spasms worsen.

## 2018-03-06 ENCOUNTER — Inpatient Hospital Stay: Payer: Medicare Other

## 2018-03-06 DIAGNOSIS — C679 Malignant neoplasm of bladder, unspecified: Secondary | ICD-10-CM | POA: Diagnosis not present

## 2018-03-06 DIAGNOSIS — C61 Malignant neoplasm of prostate: Secondary | ICD-10-CM

## 2018-03-06 LAB — CBC WITH DIFFERENTIAL/PLATELET
Basophils Absolute: 0 10*3/uL (ref 0–0.1)
Basophils Relative: 0 %
EOS ABS: 0.1 10*3/uL (ref 0–0.7)
EOS PCT: 1 %
HCT: 35.3 % — ABNORMAL LOW (ref 40.0–52.0)
Hemoglobin: 11.9 g/dL — ABNORMAL LOW (ref 13.0–18.0)
LYMPHS ABS: 1.9 10*3/uL (ref 1.0–3.6)
LYMPHS PCT: 36 %
MCH: 30.1 pg (ref 26.0–34.0)
MCHC: 33.7 g/dL (ref 32.0–36.0)
MCV: 89.5 fL (ref 80.0–100.0)
MONO ABS: 0.1 10*3/uL — AB (ref 0.2–1.0)
MONOS PCT: 3 %
Neutro Abs: 3.1 10*3/uL (ref 1.4–6.5)
Neutrophils Relative %: 60 %
PLATELETS: 78 10*3/uL — AB (ref 150–440)
RBC: 3.94 MIL/uL — ABNORMAL LOW (ref 4.40–5.90)
RDW: 19.1 % — AB (ref 11.5–14.5)
WBC: 5.2 10*3/uL (ref 3.8–10.6)

## 2018-03-06 LAB — COMPREHENSIVE METABOLIC PANEL
ALK PHOS: 59 U/L (ref 38–126)
ALT: 11 U/L — ABNORMAL LOW (ref 17–63)
ANION GAP: 8 (ref 5–15)
AST: 16 U/L (ref 15–41)
Albumin: 2.9 g/dL — ABNORMAL LOW (ref 3.5–5.0)
BILIRUBIN TOTAL: 0.4 mg/dL (ref 0.3–1.2)
BUN: 25 mg/dL — AB (ref 6–20)
CO2: 25 mmol/L (ref 22–32)
CREATININE: 0.82 mg/dL (ref 0.61–1.24)
Calcium: 8.7 mg/dL — ABNORMAL LOW (ref 8.9–10.3)
Chloride: 105 mmol/L (ref 101–111)
Glucose, Bld: 108 mg/dL — ABNORMAL HIGH (ref 65–99)
Potassium: 3.7 mmol/L (ref 3.5–5.1)
SODIUM: 138 mmol/L (ref 135–145)
Total Protein: 6.2 g/dL — ABNORMAL LOW (ref 6.5–8.1)

## 2018-03-06 LAB — PSA: Prostatic Specific Antigen: 0.03 ng/mL (ref 0.00–4.00)

## 2018-03-06 MED ORDER — HEPARIN SOD (PORK) LOCK FLUSH 100 UNIT/ML IV SOLN: 500.0000 [IU] | Freq: Once | INTRAVENOUS | Status: DC | PRN

## 2018-03-06 MED ORDER — HEPARIN SOD (PORK) LOCK FLUSH 100 UNIT/ML IV SOLN
500.0000 [IU] | Freq: Once | INTRAVENOUS | Status: AC
  Administered 2018-03-06: 500 [IU] via INTRAVENOUS
  Filled 2018-03-06: qty 5

## 2018-03-06 MED ORDER — SODIUM CHLORIDE 0.9% FLUSH
10.0000 mL | Freq: Once | INTRAVENOUS | Status: AC
  Administered 2018-03-06: 10 mL via INTRAVENOUS
  Filled 2018-03-06: qty 10

## 2018-03-06 MED ORDER — SODIUM CHLORIDE 0.9 % IV SOLN
192.8000 mg | Freq: Once | INTRAVENOUS | Status: AC
  Administered 2018-03-06: 190 mg via INTRAVENOUS
  Filled 2018-03-06: qty 19

## 2018-03-06 MED ORDER — PROCHLORPERAZINE MALEATE 10 MG PO TABS: 10.0000 mg | ORAL_TABLET | Freq: Once | ORAL | Status: DC

## 2018-03-06 MED ORDER — SODIUM CHLORIDE 0.9 % IV SOLN
1600.0000 mg | Freq: Once | INTRAVENOUS | Status: AC
  Administered 2018-03-06: 1600 mg via INTRAVENOUS
  Filled 2018-03-06: qty 26.3

## 2018-03-06 MED ORDER — SODIUM CHLORIDE 0.9 % IV SOLN
Freq: Once | INTRAVENOUS | Status: AC
  Administered 2018-03-06: 10:00:00 via INTRAVENOUS
  Filled 2018-03-06: qty 1000

## 2018-03-06 MED ORDER — PROCHLORPERAZINE EDISYLATE 10 MG/2ML IJ SOLN
10.0000 mg | Freq: Once | INTRAMUSCULAR | Status: AC
  Administered 2018-03-06: 10 mg via INTRAVENOUS
  Filled 2018-03-06: qty 2

## 2018-03-12 ENCOUNTER — Telehealth: Payer: Self-pay | Admitting: Urology

## 2018-03-12 NOTE — Telephone Encounter (Signed)
Pt came in last Monday to have catheter checked.  It is still overflowing and pt stays wet.  Please give pt a call [phone]

## 2018-03-12 NOTE — Telephone Encounter (Signed)
Spoke w/ pt wife, instructed her that this is normal and not much can be done. Pt wife also had previous conversation with Judson Roch, which detailed explanation was had.

## 2018-03-20 ENCOUNTER — Inpatient Hospital Stay: Payer: Medicare Other

## 2018-03-20 ENCOUNTER — Encounter: Payer: Self-pay | Admitting: Oncology

## 2018-03-20 ENCOUNTER — Inpatient Hospital Stay (HOSPITAL_BASED_OUTPATIENT_CLINIC_OR_DEPARTMENT_OTHER): Payer: Medicare Other | Admitting: Oncology

## 2018-03-20 ENCOUNTER — Inpatient Hospital Stay: Payer: Medicare Other | Attending: Oncology

## 2018-03-20 VITALS — BP 113/70 | HR 104 | Temp 97.3°F | Resp 18 | Ht 71.0 in | Wt 181.9 lb

## 2018-03-20 DIAGNOSIS — I10 Essential (primary) hypertension: Secondary | ICD-10-CM | POA: Insufficient documentation

## 2018-03-20 DIAGNOSIS — R5381 Other malaise: Secondary | ICD-10-CM | POA: Insufficient documentation

## 2018-03-20 DIAGNOSIS — E876 Hypokalemia: Secondary | ICD-10-CM

## 2018-03-20 DIAGNOSIS — Z923 Personal history of irradiation: Secondary | ICD-10-CM | POA: Insufficient documentation

## 2018-03-20 DIAGNOSIS — R5382 Chronic fatigue, unspecified: Secondary | ICD-10-CM

## 2018-03-20 DIAGNOSIS — Z8546 Personal history of malignant neoplasm of prostate: Secondary | ICD-10-CM | POA: Diagnosis not present

## 2018-03-20 DIAGNOSIS — C679 Malignant neoplasm of bladder, unspecified: Secondary | ICD-10-CM

## 2018-03-20 DIAGNOSIS — Z79899 Other long term (current) drug therapy: Secondary | ICD-10-CM

## 2018-03-20 DIAGNOSIS — K5900 Constipation, unspecified: Secondary | ICD-10-CM | POA: Insufficient documentation

## 2018-03-20 DIAGNOSIS — Z880 Allergy status to penicillin: Secondary | ICD-10-CM | POA: Diagnosis not present

## 2018-03-20 DIAGNOSIS — M1990 Unspecified osteoarthritis, unspecified site: Secondary | ICD-10-CM | POA: Insufficient documentation

## 2018-03-20 DIAGNOSIS — Z5111 Encounter for antineoplastic chemotherapy: Secondary | ICD-10-CM | POA: Diagnosis not present

## 2018-03-20 DIAGNOSIS — Z87440 Personal history of urinary (tract) infections: Secondary | ICD-10-CM | POA: Insufficient documentation

## 2018-03-20 DIAGNOSIS — R339 Retention of urine, unspecified: Secondary | ICD-10-CM

## 2018-03-20 LAB — COMPREHENSIVE METABOLIC PANEL
ALT: 14 U/L — AB (ref 17–63)
AST: 19 U/L (ref 15–41)
Albumin: 2.6 g/dL — ABNORMAL LOW (ref 3.5–5.0)
Alkaline Phosphatase: 52 U/L (ref 38–126)
Anion gap: 13 (ref 5–15)
BILIRUBIN TOTAL: 0.4 mg/dL (ref 0.3–1.2)
BUN: 15 mg/dL (ref 6–20)
CALCIUM: 8.3 mg/dL — AB (ref 8.9–10.3)
CHLORIDE: 100 mmol/L — AB (ref 101–111)
CO2: 23 mmol/L (ref 22–32)
CREATININE: 1.26 mg/dL — AB (ref 0.61–1.24)
GFR calc non Af Amer: 52 mL/min — ABNORMAL LOW (ref >60)
Glucose, Bld: 114 mg/dL — ABNORMAL HIGH (ref 65–99)
Potassium: 3 mmol/L — ABNORMAL LOW (ref 3.5–5.1)
Sodium: 136 mmol/L (ref 135–145)
TOTAL PROTEIN: 6.4 g/dL — AB (ref 6.5–8.1)

## 2018-03-20 LAB — CBC WITH DIFFERENTIAL/PLATELET
BASOS ABS: 0 10*3/uL (ref 0–0.1)
BASOS PCT: 0 %
EOS PCT: 0 %
Eosinophils Absolute: 0 10*3/uL (ref 0–0.7)
HCT: 28.4 % — ABNORMAL LOW (ref 40.0–52.0)
HEMOGLOBIN: 9.8 g/dL — AB (ref 13.0–18.0)
Lymphocytes Relative: 33 %
Lymphs Abs: 1.6 10*3/uL (ref 1.0–3.6)
MCH: 30.2 pg (ref 26.0–34.0)
MCHC: 34.5 g/dL (ref 32.0–36.0)
MCV: 87.6 fL (ref 80.0–100.0)
Monocytes Absolute: 1.3 10*3/uL — ABNORMAL HIGH (ref 0.2–1.0)
Monocytes Relative: 27 %
NEUTROS PCT: 40 %
Neutro Abs: 2 10*3/uL (ref 1.4–6.5)
PLATELETS: 373 10*3/uL (ref 150–440)
RBC: 3.24 MIL/uL — AB (ref 4.40–5.90)
RDW: 20 % — ABNORMAL HIGH (ref 11.5–14.5)
WBC: 4.9 10*3/uL (ref 3.8–10.6)

## 2018-03-20 MED ORDER — DEXAMETHASONE SODIUM PHOSPHATE 10 MG/ML IJ SOLN
10.0000 mg | Freq: Once | INTRAMUSCULAR | Status: AC
  Administered 2018-03-20: 10 mg via INTRAVENOUS
  Filled 2018-03-20: qty 1

## 2018-03-20 MED ORDER — SODIUM CHLORIDE 0.9 % IV SOLN: 163.4000 mg | Freq: Once | INTRAVENOUS | Status: DC

## 2018-03-20 MED ORDER — HEPARIN SOD (PORK) LOCK FLUSH 100 UNIT/ML IV SOLN
500.0000 [IU] | Freq: Once | INTRAVENOUS | Status: AC | PRN
  Administered 2018-03-20: 500 [IU]
  Filled 2018-03-20: qty 5

## 2018-03-20 MED ORDER — SODIUM CHLORIDE 0.9% FLUSH
10.0000 mL | INTRAVENOUS | Status: DC | PRN
  Filled 2018-03-20: qty 10

## 2018-03-20 MED ORDER — SODIUM CHLORIDE 0.9 % IV SOLN
Freq: Once | INTRAVENOUS | Status: AC
  Administered 2018-03-20: 10:00:00 via INTRAVENOUS
  Filled 2018-03-20: qty 1000

## 2018-03-20 MED ORDER — POTASSIUM CHLORIDE 2 MEQ/ML IV SOLN
Freq: Once | INTRAVENOUS | Status: AC
  Administered 2018-03-20: 12:00:00 via INTRAVENOUS
  Filled 2018-03-20: qty 1000

## 2018-03-20 MED ORDER — SODIUM CHLORIDE 0.9% FLUSH
10.0000 mL | INTRAVENOUS | Status: DC | PRN
  Administered 2018-03-20: 10 mL via INTRAVENOUS
  Filled 2018-03-20: qty 10

## 2018-03-20 MED ORDER — SODIUM CHLORIDE 0.9 % IV SOLN
163.4000 mg | Freq: Once | INTRAVENOUS | Status: AC
  Administered 2018-03-20: 160 mg via INTRAVENOUS
  Filled 2018-03-20: qty 16

## 2018-03-20 MED ORDER — SODIUM CHLORIDE 0.9 % IV SOLN
1600.0000 mg | Freq: Once | INTRAVENOUS | Status: AC
  Administered 2018-03-20: 1600 mg via INTRAVENOUS
  Filled 2018-03-20: qty 26.3

## 2018-03-20 MED ORDER — PALONOSETRON HCL INJECTION 0.25 MG/5ML
0.2500 mg | Freq: Once | INTRAVENOUS | Status: AC
  Administered 2018-03-20: 0.25 mg via INTRAVENOUS
  Filled 2018-03-20: qty 5

## 2018-03-20 NOTE — Progress Notes (Signed)
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 For his prostate cancer he has received IM RT in the past. He has been seeing Dr. Erlene Quan in the past for his recurrent UTIs as well as hematuria. CT scan in July 2018 showed bladder wall thickening with perivascular edema and inflammation in the right ureter greater than the left. Findings were thought to be due to pyelonephritis at that time. He underwent cystoscopy on 12/14/2017 which showed abnormal looking prostate with necrotic material lining the entire surface area. Diffuse copious debris within the bladder appearing to be erythematous without discrete bladder tumor but visualization was poor. He was then admitted to the hospital on 12/26/2017 with symptoms of UTI and sepsis as well as new moderate bilateral hydronephrosis.  CT abdomen and pelvis with contrast on 12/20/2017 again showed market bile bladder wall thickening with perivesicular edema. Within the lumen of the bladder there is a 93.9 cm soft tissue attenuating filling defect. This is indeterminate and could represent an area of blood clot versus urothelial lesion.  He underwent repeat cystoscopy with bilateral pyelogram and ureteral stent placement as well as TURBT and TURP. He was found to have a massive tumor involving the majority of the  bladder with grossly necrotic and calcified material. There appeared to be multifocal disease with large burden of the left anterior bladder wall extending posteriorly as well as adjacent to the bladder neck and beyond the right hemitrigone. Very little normal recognizable bladder mucosa remaining.   Biopsy from TURBT and TURP showed: High-grade urothelial carcinoma with extensive necrosisinvolving both the bladder and the prostate.CT chest did not reveal any evidence of metastatic disease. He was also not found to have any regional adenopathy on CT abdomen  Patient was seen by Dr. Erlene Quan and was not deemed to be a surgical candidate. He has been referred to [place] for definitive treatment options.  Patient lives with his wife at home and ambulates with a cane. He does need assistance with his ADLs to some extent. He has not had any falls and he denies any changes in his appetite or unintentional weight loss. Denies any pain. Reports some fatigue and occasional problems with constipation. He has had 3 hospitalizations last year for urinary tract infections and currently has a chronic Foley for the last 1 month  Plan is to proceed with carboplatin/ gemcitabine 2 weeks on and 1 week off for 4-6 cycles.  Patient did receive radiation for 10 days during cycle 2 of treatment.  Given patients age, co-morbidities and frailty- I do not think he is a cisplatin candidate  Dr. Erlene Quan performed interval TURBT on 01/15/18 and was able to debulk tumor as much as possible to reduce tumor burden    Interval history- patient reports he had dark urine along with abdominal pain a few days ago.  That has resolved and now he is having clear urine in his Foley catheter.  He has chronic fatigue  but is otherwise tolerating chemo well and is feeling at his baseline.  Denies any pain today  ECOG PS- 2 Pain scale- 0  Review of systems- Review of Systems  Constitutional: Positive for malaise/fatigue.  Negative for chills, fever and weight loss.  HENT: Negative for congestion, ear discharge and nosebleeds.   Eyes: Negative for blurred vision.  Respiratory: Negative for cough, hemoptysis, sputum production, shortness of breath and wheezing.   Cardiovascular: Negative for chest pain, palpitations, orthopnea and claudication.  Gastrointestinal: Negative for abdominal pain, blood in stool, constipation, diarrhea, heartburn, melena, nausea and vomiting.  Genitourinary: Negative for dysuria, flank pain, frequency, hematuria and urgency.  Musculoskeletal: Negative for back pain, joint pain and myalgias.  Skin: Negative for rash.  Neurological: Negative for dizziness, tingling, focal weakness, seizures, weakness and headaches.  Endo/Heme/Allergies: Does not bruise/bleed easily.  Psychiatric/Behavioral: Negative for depression and suicidal ideas. The patient does not have insomnia.       Allergies  Allergen Reactions  . Penicillins Other (See Comments)    Caused nervousness as a child. Patient states he took as a teenager w/o problems Has patient had a PCN reaction causing immediate rash, facial/tongue/throat swelling, SOB or lightheadedness with hypotension: No Has patient had a PCN reaction causing severe rash involving mucus membranes or skin necrosis: No Has patient had a PCN reaction that required hospitalization: No Has patient had a PCN reaction occurring within the last 10 years: No If all of the above answers are "NO", then may proceed with     Past Medical History:  Diagnosis Date  . Anxiety   . Arthritis   . Bladder cancer ([place])   . Hypertension   . Prostate cancer ([place])   . Urinary retention 2019   foley catheter place 11/2017  . UTI (urinary tract infection) 2019   frequent UTI's over last year     Past Surgical History:  Procedure Laterality Date  . CARPAL TUNNEL RELEASE Right   . CHOLECYSTECTOMY  2004  . CYSTOSCOPY W/ URETERAL STENT PLACEMENT Bilateral 12/27/2017    Procedure: CYSTOSCOPY WITH RETROGRADE PYELOGRAM/URETERAL STENT PLACEMENT;  Surgeon: [name], MD;  Location: [facility];  Service: Urology;  Laterality: Bilateral;  . LEG TENDON SURGERY Right 1958  . PORTA CATH INSERTION N/A 01/22/2018   Procedure: PORTA CATH INSERTION;  Surgeon: [name], MD;  Location: [facility];  Service: Cardiovascular;  Laterality: N/A;  . TRANSURETHRAL RESECTION OF BLADDER TUMOR N/A 12/27/2017   Procedure: TRANSURETHRAL RESECTION OF BLADDER TUMOR (TURBT);  Surgeon: [name], MD;  Location: [facility];  Service: Urology;  Laterality: N/A;  . TRANSURETHRAL RESECTION OF BLADDER TUMOR N/A 01/15/2018   Procedure: TRANSURETHRAL RESECTION OF BLADDER TUMOR (TURBT);  Surgeon: [name], MD;  Location: [facility];  Service: Urology;  Laterality: N/A;  Need 2 hrs for this case please    Social History   Socioeconomic History  . Marital status: Married    Spouse name: Not on file  . Number of children: Not on file  . Years of education: Not on file  . Highest education level: Not on file  Occupational History  . Occupation: retired  Scientific laboratory technician  . Financial resource strain: Not on file  . Food insecurity:    Worry: Not on file    Inability: Not on file  . Transportation needs:    Medical: Not on file    Non-medical: Not on file  Tobacco Use  . Smoking status: Never Smoker  . Smokeless tobacco:  Never Used  Substance and Sexual Activity  . Alcohol use: No    Alcohol/week: 0.0 oz  . Drug use: No  . Sexual activity: Never  Lifestyle  . Physical activity:    Days per week: Not on file    Minutes per session: Not on file  . Stress: Not on file  Relationships  . Social connections:    Talks on phone: Not on file    Gets together: Not on file    Attends religious service: Not on file    Active member of club or organization: Not on file    Attends meetings of clubs or organizations: Not on file    Relationship status: Not on file  .  Intimate partner violence:    Fear of current or ex partner: Not on file    Emotionally abused: Not on file    Physically abused: Not on file    Forced sexual activity: Not on file  Other Topics Concern  . Not on file  Social History Narrative  . Not on file    Family History  Problem Relation Age of Onset  . Cancer Mother   . Chronic Renal Failure Mother   . Heart disease Father      Current Outpatient Medications:  .  CRANBERRY PO, Take 1 tablet by mouth 2 (two) times daily., Disp: , Rfl:  .  dexamethasone (DECADRON) 4 MG tablet, Take 2 tablets (8 mg total) by mouth daily. Start the day after carboplatin chemotherapy for 2 days., Disp: 30 tablet, Rfl: 1 .  finasteride (PROSCAR) 5 MG tablet, Take 1 tablet (5 mg total) by mouth daily., Disp: 90 tablet, Rfl: 3 .  LORazepam (ATIVAN) 0.5 MG tablet, Take 1 tablet (0.5 mg total) by mouth every 6 (six) hours as needed (Nausea or vomiting)., Disp: 30 tablet, Rfl: 0 .  mirabegron ER (MYRBETRIQ) 50 MG TB24 tablet, Take 1 tablet (50 mg total) by mouth daily., Disp: 30 tablet, Rfl: 3 .  oxybutynin (DITROPAN) 5 MG tablet, Take 1 tablet (5 mg total) by mouth 2 (two) times daily., Disp: 60 tablet, Rfl: 0 .  polyethylene glycol powder (MIRALAX) powder, Take 17 g by mouth daily as needed. Can increase to 3 times a day as needed for constipation but hold medication if has diarrhea, Disp: 255 g, Rfl: 0 .  senna-docusate (SENOKOT-S) 8.6-50 MG tablet, Take 1 tablet by mouth 2 (two) times daily., Disp: 60 tablet, Rfl: 0 .  tamsulosin (FLOMAX) 0.4 MG CAPS capsule, Take 1 capsule (0.4 mg total) by mouth 2 (two) times daily., Disp: 60 capsule, Rfl: 11 .  acetaminophen (TYLENOL) 500 MG tablet, Take 1,000 mg by mouth 2 (two) times daily as needed for moderate pain or headache. , Disp: , Rfl:  .  HYDROcodone-acetaminophen (NORCO/VICODIN) 5-325 MG tablet, Take 1 tablet by mouth every 6 (six) hours as needed for severe pain. (Patient not taking: Reported on  02/27/2018), Disp: 20 tablet, Rfl: 0 .  lidocaine-prilocaine (EMLA) cream, Apply small amount of cream over port site 1 12/ hours before each chemotherapy treatment. Place small piece of saran wrap over cream to protect clothing. (Patient not taking: Reported on 03/20/2018), Disp: 30 g, Rfl: 1 .  lidocaine-prilocaine (EMLA) cream, Apply to affected area once (Patient not taking: Reported on 02/27/2018), Disp: 30 g, Rfl: 3 .  ondansetron (ZOFRAN) 8 MG tablet, TAKE ONE TABLET TWICE DAILY AS NEEDED FOR NAUSEA / VOMITING START ON DAY 3 AFTER CARBOPLATIN CHEMO (Patient not taking: Reported  on 02/27/2018), Disp: 30 tablet, Rfl: 1 .  prochlorperazine (COMPAZINE) 10 MG tablet, Take 1 tablet (10 mg total) by mouth every 6 (six) hours as needed (Nausea or vomiting). (Patient not taking: Reported on 02/27/2018), Disp: 30 tablet, Rfl: 1 No current facility-administered medications for this visit.   Facility-Administered Medications Ordered in Other Visits:  .  heparin lock flush 100 unit/mL, 500 Units, Intracatheter, Once PRN, Sindy Guadeloupe, MD .  sodium chloride 0.9 % 1,000 mL with potassium chloride 40 mEq infusion, , Intravenous, Once, Sindy Guadeloupe, MD, Last Rate: 500 mL/hr at 03/20/18 1145 .  sodium chloride flush (NS) 0.9 % injection 10 mL, 10 mL, Intravenous, PRN, Sindy Guadeloupe, MD, 10 mL at 03/20/18 0835 .  sodium chloride flush (NS) 0.9 % injection 10 mL, 10 mL, Intracatheter, PRN, Sindy Guadeloupe, MD  Physical exam:  Vitals:   03/20/18 0849  BP: 113/70  Pulse: (!) 104  Resp: 18  Temp: (!) 97.3 F (36.3 C)  TempSrc: Tympanic  SpO2: 97%  Weight: 181 lb 14.4 oz (82.5 kg)  Height: 5\' 11"  (1.803 m)   Physical Exam  Constitutional: He is oriented to person, place, and time.  Frail elderly gentleman sitting in a wheelchair.  Does not appear to be in any acute distress  HENT:  Head: Normocephalic and atraumatic.  Eyes: Pupils are equal, round, and reactive to light. EOM are normal.  Neck: Normal  range of motion.  Cardiovascular: Normal rate, regular rhythm and normal heart sounds.  Pulmonary/Chest: Effort normal and breath sounds normal.  Abdominal: Soft. Bowel sounds are normal.  Foley is draining clear urine  Neurological: He is alert and oriented to person, place, and time.  Skin: Skin is warm and dry.     CMP Latest Ref Rng & Units 03/20/2018  Glucose 65 - 99 mg/dL 114(H)  BUN 6 - 20 mg/dL 15  Creatinine 0.61 - 1.24 mg/dL 1.26(H)  Sodium 135 - 145 mmol/L 136  Potassium 3.5 - 5.1 mmol/L 3.0(L)  Chloride 101 - 111 mmol/L 100(L)  CO2 22 - 32 mmol/L 23  Calcium 8.9 - 10.3 mg/dL 8.3(L)  Total Protein 6.5 - 8.1 g/dL 6.4(L)  Total Bilirubin 0.3 - 1.2 mg/dL 0.4  Alkaline Phos 38 - 126 U/L 52  AST 15 - 41 U/L 19  ALT 17 - 63 U/L 14(L)   CBC Latest Ref Rng & Units 03/20/2018  WBC 3.8 - 10.6 K/uL 4.9  Hemoglobin 13.0 - 18.0 g/dL 9.8(L)  Hematocrit 40.0 - 52.0 % 28.4(L)  Platelets 150 - 440 K/uL 373     Assessment and plan- Patient is a 83 y.o. 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    He will proceed for cycle 4-day 8 of carboplatin and gemcitabine next week and he can get chemotherapy as long as his platelet counts are more than 75  Repeat CT chest abdomen and pelvis with contrast 3 weeks from now   I will see him back 4 weeks from now on June 6 or 7 to discuss the results of his scans.  Based on response of the tumor on scans I may consider giving him 2 more cycles of chemotherapy.  He has a repeat cystoscopy with Dr. Erlene Quan next month  Hypokalemia: We will give 40 mEq of IV potassium today.  His creatinine is mildly elevated at 1.26 and I will therefore receive 1 L of IV fluids today.  We will check potassium and magnesium levels again next  week   Visit Diagnosis 1. Malignant neoplasm of urinary bladder, unspecified site ([place])   2. Encounter for antineoplastic chemotherapy      Dr. Randa Evens, MD, MPH [facility] [phone] 03/20/2018 12:06 PM

## 2018-03-20 NOTE — Progress Notes (Signed)
Carboplatin dose patient has been receiving has been 190mg . SCr has increased, therefore new calculated dose is 160mg . Dose was changed to 160mg  due to this being more than 10% from 190mg 

## 2018-03-20 NOTE — Progress Notes (Signed)
No new changes noted today 

## 2018-03-20 NOTE — Progress Notes (Signed)
Per MD, give potassium 40meq over 2 hours 

## 2018-03-22 ENCOUNTER — Ambulatory Visit: Payer: Medicare Other

## 2018-03-22 VITALS — BP 102/64 | HR 99 | Resp 16 | Ht 71.0 in | Wt 186.8 lb

## 2018-03-22 DIAGNOSIS — C678 Malignant neoplasm of overlapping sites of bladder: Secondary | ICD-10-CM

## 2018-03-22 NOTE — Progress Notes (Signed)
.  Cath Change/ Replacement  Patient is present today for a catheter change due to urinary retention.  43ml of water was removed from the balloon, a 18FR coude cath was removed with out difficulty.  Patient was cleaned and prepped in a sterile fashion with betadine and 2% lidocaine jelly was instilled into the urethra. A 18 FR coude cath was replaced into the bladder no complications were noted Urine return was noted 67ml and urine was milky in color. The balloon was filled with 49ml of sterile water. A night bag was attached for drainage.  A night bag was also given to the patient and patient was given instruction on how to change from one bag to another. Patient was given proper instruction on catheter care.    Preformed by: Cristie Hem, CMA  Follow up: As scheduled  Blood pressure 102/64, pulse 99, resp. rate 16, height 5\' 11"  (1.803 m), weight 186 lb 12.8 oz (84.7 kg), SpO2 96 %.

## 2018-03-27 ENCOUNTER — Inpatient Hospital Stay: Payer: Medicare Other

## 2018-03-27 DIAGNOSIS — E876 Hypokalemia: Secondary | ICD-10-CM

## 2018-03-27 DIAGNOSIS — C679 Malignant neoplasm of bladder, unspecified: Secondary | ICD-10-CM | POA: Diagnosis not present

## 2018-03-27 LAB — CBC WITH DIFFERENTIAL/PLATELET
BASOS PCT: 0 %
Basophils Absolute: 0 10*3/uL (ref 0–0.1)
Eosinophils Absolute: 0 10*3/uL (ref 0–0.7)
Eosinophils Relative: 0 %
HEMATOCRIT: 29.2 % — AB (ref 40.0–52.0)
HEMOGLOBIN: 10 g/dL — AB (ref 13.0–18.0)
LYMPHS PCT: 28 %
Lymphs Abs: 2 10*3/uL (ref 1.0–3.6)
MCH: 29.9 pg (ref 26.0–34.0)
MCHC: 34.3 g/dL (ref 32.0–36.0)
MCV: 87.2 fL (ref 80.0–100.0)
MONO ABS: 0.6 10*3/uL (ref 0.2–1.0)
MONOS PCT: 9 %
NEUTROS ABS: 4.6 10*3/uL (ref 1.4–6.5)
NEUTROS PCT: 63 %
Platelets: 248 10*3/uL (ref 150–440)
RBC: 3.35 MIL/uL — ABNORMAL LOW (ref 4.40–5.90)
RDW: 19.9 % — ABNORMAL HIGH (ref 11.5–14.5)
WBC: 7.2 10*3/uL (ref 3.8–10.6)

## 2018-03-27 LAB — COMPREHENSIVE METABOLIC PANEL
ALBUMIN: 2.8 g/dL — AB (ref 3.5–5.0)
ALK PHOS: 54 U/L (ref 38–126)
ALT: 13 U/L — ABNORMAL LOW (ref 17–63)
ANION GAP: 11 (ref 5–15)
AST: 21 U/L (ref 15–41)
BUN: 18 mg/dL (ref 6–20)
CALCIUM: 8.6 mg/dL — AB (ref 8.9–10.3)
CO2: 23 mmol/L (ref 22–32)
Chloride: 104 mmol/L (ref 101–111)
Creatinine, Ser: 0.93 mg/dL (ref 0.61–1.24)
GFR calc non Af Amer: >60 mL/min (ref >60)
GLUCOSE: 140 mg/dL — AB (ref 65–99)
POTASSIUM: 3.3 mmol/L — AB (ref 3.5–5.1)
SODIUM: 138 mmol/L (ref 135–145)
TOTAL PROTEIN: 6.4 g/dL — AB (ref 6.5–8.1)
Total Bilirubin: 0.4 mg/dL (ref 0.3–1.2)

## 2018-03-27 LAB — MAGNESIUM: Magnesium: 1.5 mg/dL — ABNORMAL LOW (ref 1.7–2.4)

## 2018-03-27 MED ORDER — SODIUM CHLORIDE 0.9 % IV SOLN
Freq: Once | INTRAVENOUS | Status: AC
  Administered 2018-03-27: 14:00:00 via INTRAVENOUS
  Filled 2018-03-27: qty 1000

## 2018-03-27 MED ORDER — SODIUM CHLORIDE 0.9 % IV SOLN
1600.0000 mg | Freq: Once | INTRAVENOUS | Status: AC
  Administered 2018-03-27: 1600 mg via INTRAVENOUS
  Filled 2018-03-27: qty 26.3

## 2018-03-27 MED ORDER — HEPARIN SOD (PORK) LOCK FLUSH 100 UNIT/ML IV SOLN: 500.0000 [IU] | Freq: Once | INTRAVENOUS | Status: DC | PRN

## 2018-03-27 MED ORDER — HEPARIN SOD (PORK) LOCK FLUSH 100 UNIT/ML IV SOLN
500.0000 [IU] | Freq: Once | INTRAVENOUS | Status: AC
  Administered 2018-03-27: 500 [IU] via INTRAVENOUS

## 2018-03-27 MED ORDER — SODIUM CHLORIDE 0.9% FLUSH
10.0000 mL | Freq: Once | INTRAVENOUS | Status: AC
  Administered 2018-03-27: 10 mL via INTRAVENOUS
  Filled 2018-03-27: qty 10

## 2018-03-27 MED ORDER — PROCHLORPERAZINE MALEATE 10 MG PO TABS
10.0000 mg | ORAL_TABLET | Freq: Once | ORAL | Status: AC
  Administered 2018-03-27: 10 mg via ORAL
  Filled 2018-03-27: qty 1

## 2018-03-27 MED ORDER — SODIUM CHLORIDE 0.9% FLUSH
10.0000 mL | INTRAVENOUS | Status: DC | PRN
  Filled 2018-03-27: qty 10

## 2018-03-27 MED ORDER — SODIUM CHLORIDE 0.9 % IV SOLN
192.8000 mg | Freq: Once | INTRAVENOUS | Status: AC
  Administered 2018-03-27: 190 mg via INTRAVENOUS
  Filled 2018-03-27: qty 19

## 2018-03-27 MED ORDER — SODIUM CHLORIDE 0.9 % IV SOLN
Freq: Once | INTRAVENOUS | Status: AC
  Administered 2018-03-27: 15:00:00 via INTRAVENOUS
  Filled 2018-03-27: qty 1000

## 2018-04-04 ENCOUNTER — Other Ambulatory Visit: Payer: Self-pay

## 2018-04-04 ENCOUNTER — Ambulatory Visit
Admission: RE | Admit: 2018-04-04 | Discharge: 2018-04-04 | Disposition: A | Discharge status: Home / Self Care | Payer: Medicare Other | Source: Ambulatory Visit | Attending: Radiation Oncology | Admitting: Radiation Oncology

## 2018-04-04 ENCOUNTER — Ambulatory Visit
Admission: RE | Admit: 2018-04-04 | Discharge: 2018-04-04 | Disposition: A | Discharge status: Home / Self Care | Payer: Medicare Other | Source: Ambulatory Visit | Attending: Oncology | Admitting: Oncology

## 2018-04-04 ENCOUNTER — Encounter: Payer: Self-pay | Admitting: Radiation Oncology

## 2018-04-04 VITALS — BP 119/72 | HR 102 | Temp 95.3°F | Resp 22

## 2018-04-04 DIAGNOSIS — J61 Pneumoconiosis due to asbestos and other mineral fibers: Secondary | ICD-10-CM | POA: Diagnosis not present

## 2018-04-04 DIAGNOSIS — I2510 Atherosclerotic heart disease of native coronary artery without angina pectoris: Secondary | ICD-10-CM | POA: Insufficient documentation

## 2018-04-04 DIAGNOSIS — C7911 Secondary malignant neoplasm of bladder: Secondary | ICD-10-CM | POA: Diagnosis not present

## 2018-04-04 DIAGNOSIS — C679 Malignant neoplasm of bladder, unspecified: Secondary | ICD-10-CM | POA: Diagnosis not present

## 2018-04-04 DIAGNOSIS — N1339 Other hydronephrosis: Secondary | ICD-10-CM | POA: Insufficient documentation

## 2018-04-04 DIAGNOSIS — K861 Other chronic pancreatitis: Secondary | ICD-10-CM | POA: Insufficient documentation

## 2018-04-04 DIAGNOSIS — Z8546 Personal history of malignant neoplasm of prostate: Secondary | ICD-10-CM | POA: Diagnosis present

## 2018-04-04 DIAGNOSIS — Z9689 Presence of other specified functional implants: Secondary | ICD-10-CM | POA: Diagnosis not present

## 2018-04-04 DIAGNOSIS — I700 Atherosclerosis of aorta: Secondary | ICD-10-CM | POA: Diagnosis not present

## 2018-04-04 MED ORDER — IOPAMIDOL (ISOVUE-300) INJECTION 61%
100.0000 mL | Freq: Once | INTRAVENOUS | Status: AC | PRN
  Administered 2018-04-04: 100 mL via INTRAVENOUS

## 2018-04-04 NOTE — Progress Notes (Signed)
Radiation Oncology Follow up Note  Name: Vincent Poole   Date:   04/04/2018 MRN:  [id number] DOB: [birth year]    This 83 y.o. male presents to the clinic today for one-month follow-up status post palliative radiation therapy to his bladder for urothelial carcinoma in patient previously treated 7 years prior for adenocarcinoma the prostate.  REFERRING PROVIDER: [name], MD  HPI: patient is an 83 year old male now out 1 month having completed palliative ration therapy to his bladder for involvement of urothelial carcinoma in a patient previously treated 7 years prior with external beam radiation therapy for adenocarcinoma the prostate. He is still having significant pain and he is still catheterized..he is currently undergoing chemotherapy with carboplatinum and gemcitabine by medical oncology. He is scheduled for repeat cystoscopy I believe next month by Dr. Erlene Quan.he had a CT scan of abdomen and pelvis today showing bilateral hydronephrosis with double-J ureteral stents in place. Also noted were bladder wall thickening and surrounding haziness similar to prior scan done in February 2019. He states they're not seeing as much blood or grunge in his Foley bag as they saw before radiation therapy.  COMPLICATIONS OF TREATMENT: none  FOLLOW UP COMPLIANCE: keeps appointments   PHYSICAL EXAM:  BP 119/72   Pulse (!) 102   Temp (!) 95.3 F (35.2 C)   Resp (!) 31  Frail-appearing wheelchair-bound male in NAD. He has an indwelling Foley catheter present. Abdomen is benign no pain is elicited on palpation of suprapubic per region. Well-developed well-nourished patient in NAD. HEENT reveals PERLA, EOMI, discs not visualized.  Oral cavity is clear. No oral mucosal lesions are identified. Neck is clear without evidence of cervical or supraclavicular adenopathy. Lungs are clear to A&P. Cardiac examination is essentially unremarkable with regular rate and rhythm without murmur rub or thrill.  Abdomen is benign with no organomegaly or masses noted. Motor sensory and DTR levels are equal and symmetric in the upper and lower extremities. Cranial nerves II through XII are grossly intact. Proprioception is intact. No peripheral adenopathy or edema is identified. No motor or sensory levels are noted. Crude visual fields are within normal range.  RADIOLOGY RESULTS: CT scans are reviewed and compatible with the above-stated findings  PLAN: at the present time patient will continue with chemotherapy under medical oncology's direction. I'll be interested in seeing results of his repeat cystoscopy next month by Dr. Erlene Quan. Have no other further plans for palliative radiation therapy at this time. I have asked to see him back in 4-5 months for follow-up. Patient and wife know to call with any concerns.  I would like to take this opportunity to thank you for allowing me to participate in the care of your patient.Noreene Filbert, MD

## 2018-04-12 ENCOUNTER — Encounter: Payer: Self-pay | Admitting: *Deleted

## 2018-04-12 ENCOUNTER — Inpatient Hospital Stay: Payer: Medicare Other

## 2018-04-12 ENCOUNTER — Inpatient Hospital Stay (HOSPITAL_BASED_OUTPATIENT_CLINIC_OR_DEPARTMENT_OTHER): Payer: Medicare Other | Admitting: Oncology

## 2018-04-12 ENCOUNTER — Encounter: Payer: Self-pay | Admitting: Oncology

## 2018-04-12 ENCOUNTER — Inpatient Hospital Stay: Payer: Medicare Other | Attending: Oncology

## 2018-04-12 ENCOUNTER — Telehealth: Payer: Self-pay | Admitting: Urology

## 2018-04-12 VITALS — BP 121/78 | HR 99 | Temp 97.8°F | Resp 18 | Ht 71.0 in | Wt 186.7 lb

## 2018-04-12 DIAGNOSIS — M1990 Unspecified osteoarthritis, unspecified site: Secondary | ICD-10-CM | POA: Insufficient documentation

## 2018-04-12 DIAGNOSIS — Z8546 Personal history of malignant neoplasm of prostate: Secondary | ICD-10-CM

## 2018-04-12 DIAGNOSIS — C679 Malignant neoplasm of bladder, unspecified: Secondary | ICD-10-CM | POA: Insufficient documentation

## 2018-04-12 DIAGNOSIS — R5383 Other fatigue: Secondary | ICD-10-CM | POA: Insufficient documentation

## 2018-04-12 DIAGNOSIS — Z79899 Other long term (current) drug therapy: Secondary | ICD-10-CM | POA: Diagnosis not present

## 2018-04-12 DIAGNOSIS — Z923 Personal history of irradiation: Secondary | ICD-10-CM | POA: Insufficient documentation

## 2018-04-12 DIAGNOSIS — R5381 Other malaise: Secondary | ICD-10-CM

## 2018-04-12 DIAGNOSIS — Z5111 Encounter for antineoplastic chemotherapy: Secondary | ICD-10-CM | POA: Insufficient documentation

## 2018-04-12 DIAGNOSIS — I2510 Atherosclerotic heart disease of native coronary artery without angina pectoris: Secondary | ICD-10-CM | POA: Insufficient documentation

## 2018-04-12 DIAGNOSIS — F419 Anxiety disorder, unspecified: Secondary | ICD-10-CM | POA: Diagnosis not present

## 2018-04-12 DIAGNOSIS — Z87440 Personal history of urinary (tract) infections: Secondary | ICD-10-CM | POA: Insufficient documentation

## 2018-04-12 DIAGNOSIS — Z452 Encounter for adjustment and management of vascular access device: Secondary | ICD-10-CM | POA: Insufficient documentation

## 2018-04-12 LAB — CBC WITH DIFFERENTIAL/PLATELET
Basophils Absolute: 0.1 K/uL (ref 0–0.1)
Basophils Relative: 1 %
Eosinophils Absolute: 0.1 K/uL (ref 0–0.7)
Eosinophils Relative: 2 %
HCT: 28.6 % — ABNORMAL LOW (ref 40.0–52.0)
Hemoglobin: 9.8 g/dL — ABNORMAL LOW (ref 13.0–18.0)
Lymphocytes Relative: 27 %
Lymphs Abs: 1.6 K/uL (ref 1.0–3.6)
MCH: 31.1 pg (ref 26.0–34.0)
MCHC: 34.2 g/dL (ref 32.0–36.0)
MCV: 90.8 fL (ref 80.0–100.0)
Monocytes Absolute: 1.2 K/uL — ABNORMAL HIGH (ref 0.2–1.0)
Monocytes Relative: 21 %
Neutro Abs: 2.9 K/uL (ref 1.4–6.5)
Neutrophils Relative %: 49 %
Platelets: 439 K/uL (ref 150–440)
RBC: 3.15 MIL/uL — ABNORMAL LOW (ref 4.40–5.90)
RDW: 23.9 % — ABNORMAL HIGH (ref 11.5–14.5)
WBC: 5.8 K/uL (ref 3.8–10.6)

## 2018-04-12 LAB — COMPREHENSIVE METABOLIC PANEL WITH GFR
ALT: 7 U/L — ABNORMAL LOW (ref 17–63)
AST: 14 U/L — ABNORMAL LOW (ref 15–41)
Albumin: 2.7 g/dL — ABNORMAL LOW (ref 3.5–5.0)
Alkaline Phosphatase: 65 U/L (ref 38–126)
Anion gap: 8 (ref 5–15)
BUN: 17 mg/dL (ref 6–20)
CO2: 23 mmol/L (ref 22–32)
Calcium: 8.6 mg/dL — ABNORMAL LOW (ref 8.9–10.3)
Chloride: 109 mmol/L (ref 101–111)
Creatinine, Ser: 0.9 mg/dL (ref 0.61–1.24)
GFR calc Af Amer: >60 mL/min
GFR calc non Af Amer: >60 mL/min
Glucose, Bld: 104 mg/dL — ABNORMAL HIGH (ref 65–99)
Potassium: 3.5 mmol/L (ref 3.5–5.1)
Sodium: 140 mmol/L (ref 135–145)
Total Bilirubin: 0.3 mg/dL (ref 0.3–1.2)
Total Protein: 5.9 g/dL — ABNORMAL LOW (ref 6.5–8.1)

## 2018-04-12 MED ORDER — SODIUM CHLORIDE 0.9% FLUSH
10.0000 mL | INTRAVENOUS | Status: DC | PRN
  Administered 2018-04-12: 10 mL via INTRAVENOUS
  Filled 2018-04-12: qty 10

## 2018-04-12 MED ORDER — HEPARIN SOD (PORK) LOCK FLUSH 100 UNIT/ML IV SOLN
500.0000 [IU] | Freq: Once | INTRAVENOUS | Status: AC
  Administered 2018-04-12: 500 [IU] via INTRAVENOUS
  Filled 2018-04-12: qty 5

## 2018-04-12 NOTE — Telephone Encounter (Signed)
App scheduled  Sharyn Lull

## 2018-04-12 NOTE — Telephone Encounter (Signed)
Case discussed with Dr. [name].  Most recent imaging on 04/04/2018 shows persistently thickened bladder wall with surrounding haziness which is unchanged.  It would be helpful to know if there is any viable tumor within the bladder at this point prior to beginning another cycle.  Could you please find out if the patient could come to mapping tomorrow for cystoscopy at the end of the day (move up apt scheduled 2 weeks from now)?  No need for a UA prior to this given that he currently has an indwelling catheter.  He can be booked for 4 PM if he is available.  [name], MD

## 2018-04-12 NOTE — Progress Notes (Signed)
Patient c/o muscle spasm

## 2018-04-12 NOTE — Progress Notes (Signed)
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 For his prostate cancer he has received IM RT in the past. He has been seeing Dr. Erlene Quan in the past for his recurrent UTIs as well as hematuria. CT scan in July 2018 showed bladder wall thickening with perivascular edema and inflammation in the right ureter greater than the left. Findings were thought to be due to pyelonephritis at that time. He underwent cystoscopy on 12/14/2017 which showed abnormal looking prostate with necrotic material lining the entire surface area. Diffuse copious debris within the bladder appearing to be erythematous without discrete bladder tumor but visualization was poor. He was then admitted to the hospital on 12/26/2017 with symptoms of UTI and sepsis as well as new moderate bilateral hydronephrosis.  CT abdomen and pelvis with contrast on 12/20/2017 again showed market bile bladder wall thickening with perivesicular edema. Within the lumen of the bladder there is a 93.9 cm soft tissue attenuating filling defect. This is indeterminate and could represent an area of blood clot versus urothelial lesion.  He underwent repeat cystoscopy with bilateral pyelogram and ureteral stent placement as well as TURBT and TURP. He was found to have a massive tumor involving the majority of the bladder with grossly necrotic and calcified material. There  appeared to be multifocal disease with large burden of the left anterior bladder wall extending posteriorly as well as adjacent to the bladder neck and beyond the right hemitrigone. Very little normal recognizable bladder mucosa remaining.   Biopsy from TURBT and TURP showed: High-grade urothelial carcinoma with extensive necrosisinvolving both the bladder and the prostate.CT chest did not reveal any evidence of metastatic disease. He was also not found to have any regional adenopathy on CT abdomen  Patient was seen by Dr. Erlene Quan and was not deemed to be a surgical candidate. He has been referred to [place] for definitive treatment options.  Patient lives with his wife at home and ambulates with a cane. He does need assistance with his ADLs to some extent. He has not had any falls and he denies any changes in his appetite or unintentional weight loss. Denies any pain. Reports some fatigue and occasional problems with constipation. He has had 3 hospitalizations last year for urinary tract infections and currently has a chronic Foley for the last 1 month  Plan is to proceed with carboplatin/ gemcitabine 2 weeks on and 1 week off for 4-6 cycles.  Patient did receive radiation for 10 days during cycle 2 of treatment.  Given patients age, co-morbidities and frailty- I do not think he is a cisplatin candidate  Dr. Erlene Quan performed interval TURBT on 01/15/18 and was able to debulk tumor as much as possible to reduce tumor burden  Interval history- he is doing well and feels at his baseline. Appetite is good. He has not had any fever or stomach cramps.   ECOG PS- 2 Pain scale- 0 Opioid associated constipation- no  Review of systems- Review of Systems  Constitutional: Positive for  malaise/fatigue. Negative for chills, fever and weight loss.  HENT: Negative for congestion, ear discharge and nosebleeds.   Eyes: Negative for blurred vision.  Respiratory: Negative for cough, hemoptysis, sputum  production, shortness of breath and wheezing.   Cardiovascular: Negative for chest pain, palpitations, orthopnea and claudication.  Gastrointestinal: Negative for abdominal pain, blood in stool, constipation, diarrhea, heartburn, melena, nausea and vomiting.  Genitourinary: Negative for dysuria, flank pain, frequency, hematuria and urgency.  Musculoskeletal: Negative for back pain, joint pain and myalgias.  Skin: Negative for rash.  Neurological: Negative for dizziness, tingling, focal weakness, seizures, weakness and headaches.  Endo/Heme/Allergies: Does not bruise/bleed easily.  Psychiatric/Behavioral: Negative for depression and suicidal ideas. The patient does not have insomnia.       Allergies  Allergen Reactions  . Penicillins Other (See Comments)    Caused nervousness as a child. Patient states he took as a teenager w/o problems Has patient had a PCN reaction causing immediate rash, facial/tongue/throat swelling, SOB or lightheadedness with hypotension: No Has patient had a PCN reaction causing severe rash involving mucus membranes or skin necrosis: No Has patient had a PCN reaction that required hospitalization: No Has patient had a PCN reaction occurring within the last 10 years: No If all of the above answers are "NO", then may proceed with     Past Medical History:  Diagnosis Date  . Anxiety   . Arthritis   . Bladder cancer ([place])   . Hypertension   . Prostate cancer ([place])   . Urinary retention 2019   foley catheter place 11/2017  . UTI (urinary tract infection) 2019   frequent UTI's over last year     Past Surgical History:  Procedure Laterality Date  . CARPAL TUNNEL RELEASE Right   . CHOLECYSTECTOMY  2004  . CYSTOSCOPY W/ URETERAL STENT PLACEMENT Bilateral 12/27/2017   Procedure: CYSTOSCOPY WITH RETROGRADE PYELOGRAM/URETERAL STENT PLACEMENT;  Surgeon: [name], MD;  Location: [facility];  Service: Urology;  Laterality: Bilateral;  . LEG TENDON SURGERY Right  1958  . PORTA CATH INSERTION N/A 01/22/2018   Procedure: PORTA CATH INSERTION;  Surgeon: [name], MD;  Location: [facility];  Service: Cardiovascular;  Laterality: N/A;  . TRANSURETHRAL RESECTION OF BLADDER TUMOR N/A 12/27/2017   Procedure: TRANSURETHRAL RESECTION OF BLADDER TUMOR (TURBT);  Surgeon: [name], MD;  Location: [facility];  Service: Urology;  Laterality: N/A;  . TRANSURETHRAL RESECTION OF BLADDER TUMOR N/A 01/15/2018   Procedure: TRANSURETHRAL RESECTION OF BLADDER TUMOR (TURBT);  Surgeon: [name], MD;  Location: [facility];  Service: Urology;  Laterality: N/A;  Need 2 hrs for this case please    Social History   Socioeconomic History  . Marital status: Married    Spouse name: Not on file  . Number of children: Not on file  . Years of education: Not on file  . Highest education level: Not on file  Occupational History  . Occupation: retired  Scientific laboratory technician  . Financial resource strain: Not on file  . Food insecurity:    Worry: Not on file    Inability: Not on file  . Transportation needs:    Medical: Not on file    Non-medical: Not on file  Tobacco Use  . Smoking status: Never Smoker  . Smokeless tobacco: Never Used  Substance and Sexual Activity  . Alcohol use: No    Alcohol/week: 0.0 oz  . Drug use: No  . Sexual activity: Never  Lifestyle  . Physical activity:  Days per week: Not on file    Minutes per session: Not on file  . Stress: Not on file  Relationships  . Social connections:    Talks on phone: Not on file    Gets together: Not on file    Attends religious service: Not on file    Active member of club or organization: Not on file    Attends meetings of clubs or organizations: Not on file    Relationship status: Not on file  . Intimate partner violence:    Fear of current or ex partner: Not on file    Emotionally abused: Not on file    Physically abused: Not on file    Forced sexual activity: Not on file  Other Topics  Concern  . Not on file  Social History Narrative  . Not on file    Family History  Problem Relation Age of Onset  . Cancer Mother   . Chronic Renal Failure Mother   . Heart disease Father      Current Outpatient Medications:  .  acetaminophen (TYLENOL) 500 MG tablet, Take 1,000 mg by mouth 2 (two) times daily as needed for moderate pain or headache. , Disp: , Rfl:  .  CRANBERRY PO, Take 1 tablet by mouth 2 (two) times daily., Disp: , Rfl:  .  dexamethasone (DECADRON) 4 MG tablet, Take 2 tablets (8 mg total) by mouth daily. Start the day after carboplatin chemotherapy for 2 days. (Patient not taking: Reported on 04/04/2018), Disp: 30 tablet, Rfl: 1 .  finasteride (PROSCAR) 5 MG tablet, Take 1 tablet (5 mg total) by mouth daily., Disp: 90 tablet, Rfl: 3 .  HYDROcodone-acetaminophen (NORCO/VICODIN) 5-325 MG tablet, Take 1 tablet by mouth every 6 (six) hours as needed for severe pain. (Patient not taking: Reported on 02/27/2018), Disp: 20 tablet, Rfl: 0 .  lidocaine-prilocaine (EMLA) cream, Apply small amount of cream over port site 1 12/ hours before each chemotherapy treatment. Place small piece of saran wrap over cream to protect clothing. (Patient not taking: Reported on 03/20/2018), Disp: 30 g, Rfl: 1 .  lidocaine-prilocaine (EMLA) cream, Apply to affected area once (Patient not taking: Reported on 02/27/2018), Disp: 30 g, Rfl: 3 .  LORazepam (ATIVAN) 0.5 MG tablet, Take 1 tablet (0.5 mg total) by mouth every 6 (six) hours as needed (Nausea or vomiting)., Disp: 30 tablet, Rfl: 0 .  mirabegron ER (MYRBETRIQ) 50 MG TB24 tablet, Take 1 tablet (50 mg total) by mouth daily., Disp: 30 tablet, Rfl: 3 .  ondansetron (ZOFRAN) 8 MG tablet, TAKE ONE TABLET TWICE DAILY AS NEEDED FOR NAUSEA / VOMITING START ON DAY 3 AFTER CARBOPLATIN CHEMO (Patient not taking: Reported on 02/27/2018), Disp: 30 tablet, Rfl: 1 .  oxybutynin (DITROPAN) 5 MG tablet, Take 1 tablet (5 mg total) by mouth 2 (two) times daily., Disp:  60 tablet, Rfl: 0 .  polyethylene glycol powder (MIRALAX) powder, Take 17 g by mouth daily as needed. Can increase to 3 times a day as needed for constipation but hold medication if has diarrhea, Disp: 255 g, Rfl: 0 .  prochlorperazine (COMPAZINE) 10 MG tablet, Take 1 tablet (10 mg total) by mouth every 6 (six) hours as needed (Nausea or vomiting). (Patient not taking: Reported on 02/27/2018), Disp: 30 tablet, Rfl: 1 .  senna-docusate (SENOKOT-S) 8.6-50 MG tablet, Take 1 tablet by mouth 2 (two) times daily., Disp: 60 tablet, Rfl: 0 .  tamsulosin (FLOMAX) 0.4 MG CAPS capsule, Take 1 capsule (0.4 mg total)  by mouth 2 (two) times daily., Disp: 60 capsule, Rfl: 11 No current facility-administered medications for this visit.   Facility-Administered Medications Ordered in Other Visits:  .  heparin lock flush 100 unit/mL, 500 Units, Intravenous, Once, Sindy Guadeloupe, MD .  sodium chloride flush (NS) 0.9 % injection 10 mL, 10 mL, Intravenous, PRN, Sindy Guadeloupe, MD, 10 mL at 04/12/18 0815  Physical exam:  Vitals:   04/12/18 0843  BP: 121/78  Pulse: 99  Resp: 18  Temp: 97.8 F (36.6 C)  TempSrc: Tympanic  Weight: 186 lb 11.2 oz (84.7 kg)  Height: 5\' 11"  (1.803 m)   Physical Exam  Constitutional: He is oriented to person, place, and time.  Elderly gentleman sitting in a wheelchair. Appears in no acute distress  HENT:  Head: Normocephalic and atraumatic.  Eyes: Pupils are equal, round, and reactive to light. EOM are normal.  Neck: Normal range of motion.  Cardiovascular: Normal rate, regular rhythm and normal heart sounds.  Pulmonary/Chest: Effort normal and breath sounds normal.  Abdominal: Soft. Bowel sounds are normal.  Foley draining somewhat cloudy urine  Musculoskeletal: He exhibits edema.  Neurological: He is alert and oriented to person, place, and time.  Skin: Skin is warm and dry.     CMP Latest Ref Rng & Units 03/27/2018  Glucose 65 - 99 mg/dL 140(H)  BUN 6 - 20 mg/dL 18    Creatinine 0.61 - 1.24 mg/dL 0.93  Sodium 135 - 145 mmol/L 138  Potassium 3.5 - 5.1 mmol/L 3.3(L)  Chloride 101 - 111 mmol/L 104  CO2 22 - 32 mmol/L 23  Calcium 8.9 - 10.3 mg/dL 8.6(L)  Total Protein 6.5 - 8.1 g/dL 6.4(L)  Total Bilirubin 0.3 - 1.2 mg/dL 0.4  Alkaline Phos 38 - 126 U/L 54  AST 15 - 41 U/L 21  ALT 17 - 63 U/L 13(L)   CBC Latest Ref Rng & Units 04/12/2018  WBC 3.8 - 10.6 K/uL 5.8  Hemoglobin 13.0 - 18.0 g/dL 9.8(L)  Hematocrit 40.0 - 52.0 % 28.6(L)  Platelets 150 - 440 K/uL 439    No images are attached to the encounter.  Ct Chest W Contrast  Result Date: 04/04/2018 CLINICAL DATA:  Restaging bladder and prostate cancer. EXAM: CT CHEST, ABDOMEN, AND PELVIS WITH CONTRAST TECHNIQUE: Multidetector CT imaging of the chest, abdomen and pelvis was performed following the standard protocol during bolus administration of intravenous contrast. CONTRAST:  157mL ISOVUE-300 IOPAMIDOL (ISOVUE-300) INJECTION 61% COMPARISON:  CT chest 12/28/2017 and CT abdomen pelvis 12/26/2017. FINDINGS: CT CHEST FINDINGS Cardiovascular: Right IJ power port terminates in the SVC. Atherosclerotic calcification of the arterial vasculature, including coronary arteries. Heart size normal. No pericardial effusion. Mediastinum/Nodes: No pathologically enlarged mediastinal, hilar or axillary lymph nodes. Esophagus is grossly unremarkable. Lungs/Pleura: Probable scarring in the posterior segment right upper lobe. Additional scattered pulmonary parenchymal scarring bilaterally. Lungs are otherwise clear. Calcified pleural plaques bilaterally. No pleural fluid. Airway is unremarkable. Musculoskeletal: Degenerative changes in the spine. No worrisome lytic or sclerotic lesions. Degenerative changes in both shoulders. CT ABDOMEN PELVIS FINDINGS Hepatobiliary: Liver is unremarkable. Cholecystectomy. No biliary ductal dilatation. Pancreas: Calcifications are seen in the pancreatic body. Spleen: Negative. Adrenals/Urinary  Tract: Adrenal glands are unremarkable. Fluid density 3.7 cm low-attenuation lesion in the right kidney is likely a cyst. Additional subcentimeter low-attenuation lesions in the left kidney are too small to definitively characterize. Bilateral double-J ureteral stents are in place with moderate right hydronephrosis and mild left hydronephrosis. Bilateral periureteric stranding, right greater  than left. Foley catheter is seen in a decompressed bladder. Bladder appears thick-walled with surrounding haziness. Stomach/Bowel: Stomach, small bowel, appendix and colon are unremarkable. Vascular/Lymphatic: Atherosclerotic calcification of the arterial vasculature without abdominal aortic aneurysm. No pathologically enlarged lymph nodes. Reproductive: Prostate is visualized. Other: Mild presacral edema, as before. No free fluid. Mesenteries and peritoneum are otherwise unremarkable. Musculoskeletal: Advanced degenerative changes in the spine and hips. No worrisome lytic or sclerotic lesions. IMPRESSION: 1. Bilateral hydronephrosis, right greater than left, with double-J ureteral stents in place. 2. Bladder wall thickening and surrounding haziness, similar to 12/26/2017. Underlying malignancy is not excluded. 3. Aortic atherosclerosis (ICD10-170.0). Coronary artery calcification. 4. Asbestos related pleural disease. 5. Chronic calcific pancreatitis. Electronically Signed   By: Lorin Picket M.D.   On: 04/04/2018 14:13   Ct Abdomen Pelvis W Contrast  Result Date: 04/04/2018 CLINICAL DATA:  Restaging bladder and prostate cancer. EXAM: CT CHEST, ABDOMEN, AND PELVIS WITH CONTRAST TECHNIQUE: Multidetector CT imaging of the chest, abdomen and pelvis was performed following the standard protocol during bolus administration of intravenous contrast. CONTRAST:  156mL ISOVUE-300 IOPAMIDOL (ISOVUE-300) INJECTION 61% COMPARISON:  CT chest 12/28/2017 and CT abdomen pelvis 12/26/2017. FINDINGS: CT CHEST FINDINGS Cardiovascular: Right  IJ power port terminates in the SVC. Atherosclerotic calcification of the arterial vasculature, including coronary arteries. Heart size normal. No pericardial effusion. Mediastinum/Nodes: No pathologically enlarged mediastinal, hilar or axillary lymph nodes. Esophagus is grossly unremarkable. Lungs/Pleura: Probable scarring in the posterior segment right upper lobe. Additional scattered pulmonary parenchymal scarring bilaterally. Lungs are otherwise clear. Calcified pleural plaques bilaterally. No pleural fluid. Airway is unremarkable. Musculoskeletal: Degenerative changes in the spine. No worrisome lytic or sclerotic lesions. Degenerative changes in both shoulders. CT ABDOMEN PELVIS FINDINGS Hepatobiliary: Liver is unremarkable. Cholecystectomy. No biliary ductal dilatation. Pancreas: Calcifications are seen in the pancreatic body. Spleen: Negative. Adrenals/Urinary Tract: Adrenal glands are unremarkable. Fluid density 3.7 cm low-attenuation lesion in the right kidney is likely a cyst. Additional subcentimeter low-attenuation lesions in the left kidney are too small to definitively characterize. Bilateral double-J ureteral stents are in place with moderate right hydronephrosis and mild left hydronephrosis. Bilateral periureteric stranding, right greater than left. Foley catheter is seen in a decompressed bladder. Bladder appears thick-walled with surrounding haziness. Stomach/Bowel: Stomach, small bowel, appendix and colon are unremarkable. Vascular/Lymphatic: Atherosclerotic calcification of the arterial vasculature without abdominal aortic aneurysm. No pathologically enlarged lymph nodes. Reproductive: Prostate is visualized. Other: Mild presacral edema, as before. No free fluid. Mesenteries and peritoneum are otherwise unremarkable. Musculoskeletal: Advanced degenerative changes in the spine and hips. No worrisome lytic or sclerotic lesions. IMPRESSION: 1. Bilateral hydronephrosis, right greater than left, with  double-J ureteral stents in place. 2. Bladder wall thickening and surrounding haziness, similar to 12/26/2017. Underlying malignancy is not excluded. 3. Aortic atherosclerosis (ICD10-170.0). Coronary artery calcification. 4. Asbestos related pleural disease. 5. Chronic calcific pancreatitis. Electronically Signed   By: Lorin Picket M.D.   On: 04/04/2018 14:13     Assessment and plan- Patient is a 83 y.o. 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 There is persistent bladder wall thickening. Bladder mass not well visualized due to foley catheter. I have spoken to Dr. Erlene Quan over the phone today. At this time I will wait for repeat cystoscopy to assess his response. He may need repeat TURBT. I will hold off  on chemotherapy until repeat cystoscopy and schedule his return appointment based on cystoscopy findings.  Patient and his wife and in understanding of the plan   Visit Diagnosis 1. Malignant neoplasm of urinary bladder, unspecified site [facility])      Dr. Randa Evens, MD, MPH [facility] [phone] 04/12/2018  11:23 AM

## 2018-04-13 ENCOUNTER — Ambulatory Visit (INDEPENDENT_AMBULATORY_CARE_PROVIDER_SITE_OTHER): Payer: Medicare Other | Admitting: Urology

## 2018-04-13 ENCOUNTER — Encounter: Payer: Self-pay | Admitting: Urology

## 2018-04-13 VITALS — BP 130/76 | HR 100

## 2018-04-13 DIAGNOSIS — C678 Malignant neoplasm of overlapping sites of bladder: Secondary | ICD-10-CM | POA: Diagnosis not present

## 2018-04-13 DIAGNOSIS — B3789 Other sites of candidiasis: Secondary | ICD-10-CM

## 2018-04-13 MED ORDER — NYSTATIN 100000 UNIT/GM EX POWD: Freq: Two times a day (BID) | CUTANEOUS | 0 refills | Status: DC

## 2018-04-13 MED ORDER — CIPROFLOXACIN HCL 500 MG PO TABS
500.0000 mg | ORAL_TABLET | Freq: Once | ORAL | Status: AC
  Administered 2018-04-13: 500 mg via ORAL

## 2018-04-13 MED ORDER — LIDOCAINE HCL URETHRAL/MUCOSAL 2 % EX GEL
1.0000 | Freq: Once | CUTANEOUS | Status: AC
Start: 2018-04-13 — End: 2018-04-13
  Administered 2018-04-13: 1 via URETHRAL

## 2018-04-13 NOTE — Progress Notes (Signed)
Catheter Removal  Patient is present today for a catheter removal.  81ml of water was drained from the balloon. A 18 coudeFR foley cath was removed from the bladder no complications were noted . Patient tolerated well.  Preformed by: Fonnie Jarvis, CMA  Follow up/ Additional notes: yeast was noted in foreskin while prepping

## 2018-04-19 ENCOUNTER — Inpatient Hospital Stay (HOSPITAL_BASED_OUTPATIENT_CLINIC_OR_DEPARTMENT_OTHER): Payer: Medicare Other | Admitting: Oncology

## 2018-04-19 ENCOUNTER — Inpatient Hospital Stay: Payer: Medicare Other

## 2018-04-19 ENCOUNTER — Encounter: Payer: Self-pay | Admitting: Oncology

## 2018-04-19 VITALS — HR 99

## 2018-04-19 VITALS — BP 118/74 | HR 101 | Temp 96.5°F | Resp 18 | Ht 71.0 in | Wt 187.5 lb

## 2018-04-19 DIAGNOSIS — Z923 Personal history of irradiation: Secondary | ICD-10-CM | POA: Diagnosis not present

## 2018-04-19 DIAGNOSIS — Z8546 Personal history of malignant neoplasm of prostate: Secondary | ICD-10-CM | POA: Diagnosis not present

## 2018-04-19 DIAGNOSIS — F419 Anxiety disorder, unspecified: Secondary | ICD-10-CM

## 2018-04-19 DIAGNOSIS — M1990 Unspecified osteoarthritis, unspecified site: Secondary | ICD-10-CM

## 2018-04-19 DIAGNOSIS — Z79899 Other long term (current) drug therapy: Secondary | ICD-10-CM

## 2018-04-19 DIAGNOSIS — R5381 Other malaise: Secondary | ICD-10-CM | POA: Diagnosis not present

## 2018-04-19 DIAGNOSIS — C679 Malignant neoplasm of bladder, unspecified: Secondary | ICD-10-CM | POA: Diagnosis not present

## 2018-04-19 DIAGNOSIS — I2510 Atherosclerotic heart disease of native coronary artery without angina pectoris: Secondary | ICD-10-CM

## 2018-04-19 DIAGNOSIS — Z87440 Personal history of urinary (tract) infections: Secondary | ICD-10-CM | POA: Diagnosis not present

## 2018-04-19 DIAGNOSIS — C61 Malignant neoplasm of prostate: Secondary | ICD-10-CM

## 2018-04-19 DIAGNOSIS — R5383 Other fatigue: Secondary | ICD-10-CM | POA: Diagnosis not present

## 2018-04-19 DIAGNOSIS — Z5111 Encounter for antineoplastic chemotherapy: Secondary | ICD-10-CM

## 2018-04-19 LAB — COMPREHENSIVE METABOLIC PANEL
ALBUMIN: 2.7 g/dL — AB (ref 3.5–5.0)
ALT: 7 U/L — ABNORMAL LOW (ref 17–63)
ANION GAP: 9 (ref 5–15)
AST: 15 U/L (ref 15–41)
Alkaline Phosphatase: 68 U/L (ref 38–126)
BILIRUBIN TOTAL: 0.5 mg/dL (ref 0.3–1.2)
BUN: 15 mg/dL (ref 6–20)
CALCIUM: 8.7 mg/dL — AB (ref 8.9–10.3)
CO2: 22 mmol/L (ref 22–32)
Chloride: 111 mmol/L (ref 101–111)
Creatinine, Ser: 0.94 mg/dL (ref 0.61–1.24)
GFR calc non Af Amer: >60 mL/min (ref >60)
GLUCOSE: 112 mg/dL — AB (ref 65–99)
Potassium: 3.5 mmol/L (ref 3.5–5.1)
Sodium: 142 mmol/L (ref 135–145)
TOTAL PROTEIN: 6.1 g/dL — AB (ref 6.5–8.1)

## 2018-04-19 LAB — CBC WITH DIFFERENTIAL/PLATELET
BASOS ABS: 0.1 10*3/uL (ref 0–0.1)
BASOS PCT: 1 %
Eosinophils Absolute: 0.1 10*3/uL (ref 0–0.7)
Eosinophils Relative: 1 %
HEMATOCRIT: 31.7 % — AB (ref 40.0–52.0)
HEMOGLOBIN: 10.7 g/dL — AB (ref 13.0–18.0)
LYMPHS PCT: 22 %
Lymphs Abs: 1.9 10*3/uL (ref 1.0–3.6)
MCH: 30.8 pg (ref 26.0–34.0)
MCHC: 33.6 g/dL (ref 32.0–36.0)
MCV: 91.7 fL (ref 80.0–100.0)
MONO ABS: 1 10*3/uL (ref 0.2–1.0)
Monocytes Relative: 12 %
NEUTROS ABS: 5.5 10*3/uL (ref 1.4–6.5)
NEUTROS PCT: 64 %
Platelets: 321 10*3/uL (ref 150–440)
RBC: 3.46 MIL/uL — AB (ref 4.40–5.90)
RDW: 22.5 % — ABNORMAL HIGH (ref 11.5–14.5)
WBC: 8.6 10*3/uL (ref 3.8–10.6)

## 2018-04-19 LAB — PSA: Prostatic Specific Antigen: 0.02 ng/mL (ref 0.00–4.00)

## 2018-04-19 MED ORDER — SODIUM CHLORIDE 0.9% FLUSH
10.0000 mL | INTRAVENOUS | Status: DC | PRN
  Filled 2018-04-19: qty 10

## 2018-04-19 MED ORDER — HEPARIN SOD (PORK) LOCK FLUSH 100 UNIT/ML IV SOLN
500.0000 [IU] | Freq: Once | INTRAVENOUS | Status: AC
  Administered 2018-04-19: 500 [IU] via INTRAVENOUS
  Filled 2018-04-19: qty 5

## 2018-04-19 MED ORDER — SODIUM CHLORIDE 0.9 % IV SOLN
1600.0000 mg | Freq: Once | INTRAVENOUS | Status: AC
  Administered 2018-04-19: 1600 mg via INTRAVENOUS
  Filled 2018-04-19: qty 26.3

## 2018-04-19 MED ORDER — SODIUM CHLORIDE 0.9% FLUSH
10.0000 mL | Freq: Once | INTRAVENOUS | Status: AC
  Administered 2018-04-19: 10 mL via INTRAVENOUS
  Filled 2018-04-19: qty 10

## 2018-04-19 MED ORDER — PALONOSETRON HCL INJECTION 0.25 MG/5ML
0.2500 mg | Freq: Once | INTRAVENOUS | Status: AC
  Administered 2018-04-19: 0.25 mg via INTRAVENOUS
  Filled 2018-04-19: qty 5

## 2018-04-19 MED ORDER — SODIUM CHLORIDE 0.9 % IV SOLN
Freq: Once | INTRAVENOUS | Status: AC
  Administered 2018-04-19: 10:00:00 via INTRAVENOUS
  Filled 2018-04-19: qty 1000

## 2018-04-19 MED ORDER — DEXAMETHASONE SODIUM PHOSPHATE 100 MG/10ML IJ SOLN: 10.0000 mg | Freq: Once | INTRAMUSCULAR | Status: DC

## 2018-04-19 MED ORDER — DEXAMETHASONE SODIUM PHOSPHATE 10 MG/ML IJ SOLN
10.0000 mg | Freq: Once | INTRAMUSCULAR | Status: AC
  Administered 2018-04-19: 10 mg via INTRAVENOUS
  Filled 2018-04-19: qty 1

## 2018-04-19 MED ORDER — DEXAMETHASONE SODIUM PHOSPHATE 10 MG/ML IJ SOLN
10.0000 mg | Freq: Once | INTRAMUSCULAR | Status: DC
Start: 2018-04-19 — End: 2018-04-19

## 2018-04-19 MED ORDER — HEPARIN SOD (PORK) LOCK FLUSH 100 UNIT/ML IV SOLN: 500.0000 [IU] | Freq: Once | INTRAVENOUS | Status: DC | PRN

## 2018-04-19 MED ORDER — SODIUM CHLORIDE 0.9 % IV SOLN
192.8000 mg | Freq: Once | INTRAVENOUS | Status: AC
  Administered 2018-04-19: 190 mg via INTRAVENOUS
  Filled 2018-04-19: qty 19

## 2018-04-19 NOTE — Progress Notes (Signed)
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 For his prostate cancer he has received IM RT in the past. He has been seeing Dr. Erlene Quan in the past for his recurrent UTIs as well as hematuria. CT scan in July 2018 showed bladder wall thickening with perivascular edema and inflammation in the right ureter greater than the left. Findings were thought to be due to pyelonephritis at that time. He underwent cystoscopy on 12/14/2017 which showed abnormal looking prostate with necrotic material lining the entire surface area. Diffuse copious debris within the bladder appearing to be erythematous without discrete bladder tumor but visualization was poor. He was then admitted to the hospital on 12/26/2017 with symptoms of UTI and sepsis as well as new moderate bilateral hydronephrosis.  CT abdomen and pelvis with contrast on 12/20/2017 again showed market bile bladder wall thickening with perivesicular edema. Within the lumen of the bladder there is a 93.9 cm soft tissue attenuating filling defect. This is indeterminate and could represent an area of blood clot versus urothelial lesion.  He underwent repeat cystoscopy with bilateral pyelogram and ureteral stent placement as well as TURBT and TURP. He was found to have a massive tumor involving the majority of the  bladder with grossly necrotic and calcified material. There appeared to be multifocal disease with large burden of the left anterior bladder wall extending posteriorly as well as adjacent to the bladder neck and beyond the right hemitrigone. Very little normal recognizable bladder mucosa remaining.   Biopsy from TURBT and TURP showed: High-grade urothelial carcinoma with extensive necrosisinvolving both the bladder and the prostate.CT chest did not reveal any evidence of metastatic disease. He was also not found to have any regional adenopathy on CT abdomen  Patient was seen by Dr. Erlene Quan and was not deemed to be a surgical candidate. He has been referred to [place] for definitive treatment options.  Patient lives with his wife at home and ambulates with a cane. He does need assistance with his ADLs to some extent. He has not had any falls and he denies any changes in his appetite or unintentional weight loss. Denies any pain. Reports some fatigue and occasional problems with constipation. He has had 3 hospitalizations last year for urinary tract infections and currently has a chronic Foley for the last 1 month  Plan is to proceed with carboplatin/ gemcitabine 2 weeks on and 1 week off for 4-6 cycles. Patient did receive radiation for 10 days during cycle 2 of treatment. Given patients age, co-morbidities and frailty- I do not think he is a cisplatin candidate  Dr. Erlene Quan performed interval TURBT on 01/15/18 and was able to debulk tumor as much as possible to reduce tumor burden   Interval history- Patient underwent repeat cystoscopy by Dr. Erlene Quan in her office. We do nto have actual report. He feels at his baseline. Denies any new pain, sob or other complaints  ECOG PS- 2 Pain scale-  0 Opioid associated constipation- no  Review of systems- Review of Systems  Constitutional: Positive for malaise/fatigue. Negative for chills, fever and weight loss.  HENT: Negative for  congestion, ear discharge and nosebleeds.   Eyes: Negative for blurred vision.  Respiratory: Negative for cough, hemoptysis, sputum production, shortness of breath and wheezing.   Cardiovascular: Negative for chest pain, palpitations, orthopnea and claudication.  Gastrointestinal: Negative for abdominal pain, blood in stool, constipation, diarrhea, heartburn, melena, nausea and vomiting.  Genitourinary: Negative for dysuria, flank pain, frequency, hematuria and urgency.  Musculoskeletal: Negative for back pain, joint pain and myalgias.  Skin: Negative for rash.  Neurological: Negative for dizziness, tingling, focal weakness, seizures, weakness and headaches.  Endo/Heme/Allergies: Does not bruise/bleed easily.  Psychiatric/Behavioral: Negative for depression and suicidal ideas. The patient does not have insomnia.       Allergies  Allergen Reactions  . Penicillins Other (See Comments)    Caused nervousness as a child. Patient states he took as a teenager w/o problems Has patient had a PCN reaction causing immediate rash, facial/tongue/throat swelling, SOB or lightheadedness with hypotension: No Has patient had a PCN reaction causing severe rash involving mucus membranes or skin necrosis: No Has patient had a PCN reaction that required hospitalization: No Has patient had a PCN reaction occurring within the last 10 years: No If all of the above answers are "NO", then may proceed with     Past Medical History:  Diagnosis Date  . Anxiety   . Arthritis   . Bladder cancer ([place])   . Hypertension   . Prostate cancer ([place])   . Urinary retention 2019   foley catheter place 11/2017  . UTI (urinary tract infection) 2019   frequent UTI's over last year     Past Surgical History:  Procedure Laterality Date  . CARPAL TUNNEL RELEASE Right   . CHOLECYSTECTOMY  2004  . CYSTOSCOPY W/ URETERAL STENT PLACEMENT Bilateral 12/27/2017   Procedure: CYSTOSCOPY WITH RETROGRADE PYELOGRAM/URETERAL STENT  PLACEMENT;  Surgeon: [name], MD;  Location: [facility];  Service: Urology;  Laterality: Bilateral;  . LEG TENDON SURGERY Right 1958  . PORTA CATH INSERTION N/A 01/22/2018   Procedure: PORTA CATH INSERTION;  Surgeon: [name], MD;  Location: [place] [facility];  Service: Cardiovascular;  Laterality: N/A;  . TRANSURETHRAL RESECTION OF BLADDER TUMOR N/A 12/27/2017   Procedure: TRANSURETHRAL RESECTION OF BLADDER TUMOR (TURBT);  Surgeon: [name], MD;  Location: [facility];  Service: Urology;  Laterality: N/A;  . TRANSURETHRAL RESECTION OF BLADDER TUMOR N/A 01/15/2018   Procedure: TRANSURETHRAL RESECTION OF BLADDER TUMOR (TURBT);  Surgeon: [name], MD;  Location: [facility];  Service: Urology;  Laterality: N/A;  Need 2 hrs for this case please    Social History   Socioeconomic History  . Marital status: Married    Spouse name: Not on file  . Number of children: Not on file  . Years of education: Not on file  . Highest education level: Not on file  Occupational History  . Occupation: retired  Scientific laboratory technician  . Financial resource strain: Not on file  . Food insecurity:    Worry: Not on file    Inability: Not on file  . Transportation needs:    Medical: Not on file    Non-medical: Not on file  Tobacco Use  . Smoking status: Never Smoker  . Smokeless tobacco: Never Used  Substance and Sexual Activity  . Alcohol use: No    Alcohol/week: 0.0 oz  .  Drug use: No  . Sexual activity: Never  Lifestyle  . Physical activity:    Days per week: Not on file    Minutes per session: Not on file  . Stress: Not on file  Relationships  . Social connections:    Talks on phone: Not on file    Gets together: Not on file    Attends religious service: Not on file    Active member of club or organization: Not on file    Attends meetings of clubs or organizations: Not on file    Relationship status: Not on file  . Intimate partner violence:    Fear of current or ex partner: Not on  file    Emotionally abused: Not on file    Physically abused: Not on file    Forced sexual activity: Not on file  Other Topics Concern  . Not on file  Social History Narrative  . Not on file    Family History  Problem Relation Age of Onset  . Cancer Mother   . Chronic Renal Failure Mother   . Heart disease Father      Current Outpatient Medications:  .  CRANBERRY PO, Take 1 tablet by mouth 2 (two) times daily., Disp: , Rfl:  .  finasteride (PROSCAR) 5 MG tablet, Take 1 tablet (5 mg total) by mouth daily., Disp: 90 tablet, Rfl: 3 .  mirabegron ER (MYRBETRIQ) 50 MG TB24 tablet, Take 1 tablet (50 mg total) by mouth daily., Disp: 30 tablet, Rfl: 3 .  oxybutynin (DITROPAN) 5 MG tablet, Take 1 tablet (5 mg total) by mouth 2 (two) times daily., Disp: 60 tablet, Rfl: 0 .  tamsulosin (FLOMAX) 0.4 MG CAPS capsule, Take 1 capsule (0.4 mg total) by mouth 2 (two) times daily., Disp: 60 capsule, Rfl: 11 .  acetaminophen (TYLENOL) 500 MG tablet, Take 1,000 mg by mouth 2 (two) times daily as needed for moderate pain or headache. , Disp: , Rfl:  .  dexamethasone (DECADRON) 4 MG tablet, Take 2 tablets (8 mg total) by mouth daily. Start the day after carboplatin chemotherapy for 2 days. (Patient not taking: Reported on 04/04/2018), Disp: 30 tablet, Rfl: 1 .  HYDROcodone-acetaminophen (NORCO/VICODIN) 5-325 MG tablet, Take 1 tablet by mouth every 6 (six) hours as needed for severe pain. (Patient not taking: Reported on 02/27/2018), Disp: 20 tablet, Rfl: 0 .  lidocaine-prilocaine (EMLA) cream, Apply small amount of cream over port site 1 12/ hours before each chemotherapy treatment. Place small piece of saran wrap over cream to protect clothing. (Patient not taking: Reported on 03/20/2018), Disp: 30 g, Rfl: 1 .  lidocaine-prilocaine (EMLA) cream, Apply to affected area once (Patient not taking: Reported on 02/27/2018), Disp: 30 g, Rfl: 3 .  LORazepam (ATIVAN) 0.5 MG tablet, Take 1 tablet (0.5 mg total) by mouth  every 6 (six) hours as needed (Nausea or vomiting). (Patient not taking: Reported on 04/12/2018), Disp: 30 tablet, Rfl: 0 .  nystatin (MYCOSTATIN/NYSTOP) powder, Apply topically 2 (two) times daily. (Patient not taking: Reported on 04/19/2018), Disp: 15 g, Rfl: 0 .  ondansetron (ZOFRAN) 8 MG tablet, TAKE ONE TABLET TWICE DAILY AS NEEDED FOR NAUSEA / VOMITING START ON DAY 3 AFTER CARBOPLATIN CHEMO (Patient not taking: Reported on 02/27/2018), Disp: 30 tablet, Rfl: 1 .  polyethylene glycol powder (MIRALAX) powder, Take 17 g by mouth daily as needed. Can increase to 3 times a day as needed for constipation but hold medication if has diarrhea (Patient not taking: Reported on  04/12/2018), Disp: 255 g, Rfl: 0 .  prochlorperazine (COMPAZINE) 10 MG tablet, Take 1 tablet (10 mg total) by mouth every 6 (six) hours as needed (Nausea or vomiting). (Patient not taking: Reported on 02/27/2018), Disp: 30 tablet, Rfl: 1 .  senna-docusate (SENOKOT-S) 8.6-50 MG tablet, Take 1 tablet by mouth 2 (two) times daily. (Patient not taking: Reported on 04/19/2018), Disp: 60 tablet, Rfl: 0 No current facility-administered medications for this visit.   Facility-Administered Medications Ordered in Other Visits:  .  dexamethasone (DECADRON) injection 10 mg, 10 mg, Intravenous, Once, Randa Evens C, MD .  heparin lock flush 100 unit/mL, 500 Units, Intracatheter, Once PRN, Sindy Guadeloupe, MD .  sodium chloride flush (NS) 0.9 % injection 10 mL, 10 mL, Intracatheter, PRN, Sindy Guadeloupe, MD  Physical exam:  Vitals:   04/19/18 0856  BP: 118/74  Pulse: (!) 101  Resp: 18  Temp: (!) 96.5 F (35.8 C)  TempSrc: Tympanic  Weight: 187 lb 8 oz (85 kg)  Height: 5\' 11"  (1.803 m)   Physical Exam  Constitutional: He is oriented to person, place, and time.  Elderly frail gentleman sitting in a wheelchair. Appears in no acute distress  HENT:  Head: Normocephalic and atraumatic.  Eyes: Pupils are equal, round, and reactive to light. EOM are  normal.  Neck: Normal range of motion.  Cardiovascular: Normal rate, regular rhythm and normal heart sounds.  Pulmonary/Chest: Effort normal and breath sounds normal.  Abdominal: Soft. Bowel sounds are normal.  Neurological: He is alert and oriented to person, place, and time.  Skin: Skin is warm and dry.     CMP Latest Ref Rng & Units 04/19/2018  Glucose 65 - 99 mg/dL 112(H)  BUN 6 - 20 mg/dL 15  Creatinine 0.61 - 1.24 mg/dL 0.94  Sodium 135 - 145 mmol/L 142  Potassium 3.5 - 5.1 mmol/L 3.5  Chloride 101 - 111 mmol/L 111  CO2 22 - 32 mmol/L 22  Calcium 8.9 - 10.3 mg/dL 8.7(L)  Total Protein 6.5 - 8.1 g/dL 6.1(L)  Total Bilirubin 0.3 - 1.2 mg/dL 0.5  Alkaline Phos 38 - 126 U/L 68  AST 15 - 41 U/L 15  ALT 17 - 63 U/L 7(L)   CBC Latest Ref Rng & Units 04/19/2018  WBC 3.8 - 10.6 K/uL 8.6  Hemoglobin 13.0 - 18.0 g/dL 10.7(L)  Hematocrit 40.0 - 52.0 % 31.7(L)  Platelets 150 - 440 K/uL 321    No images are attached to the encounter.  Ct Chest W Contrast  Result Date: 04/04/2018 CLINICAL DATA:  Restaging bladder and prostate cancer. EXAM: CT CHEST, ABDOMEN, AND PELVIS WITH CONTRAST TECHNIQUE: Multidetector CT imaging of the chest, abdomen and pelvis was performed following the standard protocol during bolus administration of intravenous contrast. CONTRAST:  172mL ISOVUE-300 IOPAMIDOL (ISOVUE-300) INJECTION 61% COMPARISON:  CT chest 12/28/2017 and CT abdomen pelvis 12/26/2017. FINDINGS: CT CHEST FINDINGS Cardiovascular: Right IJ power port terminates in the SVC. Atherosclerotic calcification of the arterial vasculature, including coronary arteries. Heart size normal. No pericardial effusion. Mediastinum/Nodes: No pathologically enlarged mediastinal, hilar or axillary lymph nodes. Esophagus is grossly unremarkable. Lungs/Pleura: Probable scarring in the posterior segment right upper lobe. Additional scattered pulmonary parenchymal scarring bilaterally. Lungs are otherwise clear. Calcified  pleural plaques bilaterally. No pleural fluid. Airway is unremarkable. Musculoskeletal: Degenerative changes in the spine. No worrisome lytic or sclerotic lesions. Degenerative changes in both shoulders. CT ABDOMEN PELVIS FINDINGS Hepatobiliary: Liver is unremarkable. Cholecystectomy. No biliary ductal dilatation. Pancreas: Calcifications are seen in  the pancreatic body. Spleen: Negative. Adrenals/Urinary Tract: Adrenal glands are unremarkable. Fluid density 3.7 cm low-attenuation lesion in the right kidney is likely a cyst. Additional subcentimeter low-attenuation lesions in the left kidney are too small to definitively characterize. Bilateral double-J ureteral stents are in place with moderate right hydronephrosis and mild left hydronephrosis. Bilateral periureteric stranding, right greater than left. Foley catheter is seen in a decompressed bladder. Bladder appears thick-walled with surrounding haziness. Stomach/Bowel: Stomach, small bowel, appendix and colon are unremarkable. Vascular/Lymphatic: Atherosclerotic calcification of the arterial vasculature without abdominal aortic aneurysm. No pathologically enlarged lymph nodes. Reproductive: Prostate is visualized. Other: Mild presacral edema, as before. No free fluid. Mesenteries and peritoneum are otherwise unremarkable. Musculoskeletal: Advanced degenerative changes in the spine and hips. No worrisome lytic or sclerotic lesions. IMPRESSION: 1. Bilateral hydronephrosis, right greater than left, with double-J ureteral stents in place. 2. Bladder wall thickening and surrounding haziness, similar to 12/26/2017. Underlying malignancy is not excluded. 3. Aortic atherosclerosis (ICD10-170.0). Coronary artery calcification. 4. Asbestos related pleural disease. 5. Chronic calcific pancreatitis. Electronically Signed   By: Lorin Picket M.D.   On: 04/04/2018 14:13   Ct Abdomen Pelvis W Contrast  Result Date: 04/04/2018 CLINICAL DATA:  Restaging bladder and prostate  cancer. EXAM: CT CHEST, ABDOMEN, AND PELVIS WITH CONTRAST TECHNIQUE: Multidetector CT imaging of the chest, abdomen and pelvis was performed following the standard protocol during bolus administration of intravenous contrast. CONTRAST:  129mL ISOVUE-300 IOPAMIDOL (ISOVUE-300) INJECTION 61% COMPARISON:  CT chest 12/28/2017 and CT abdomen pelvis 12/26/2017. FINDINGS: CT CHEST FINDINGS Cardiovascular: Right IJ power port terminates in the SVC. Atherosclerotic calcification of the arterial vasculature, including coronary arteries. Heart size normal. No pericardial effusion. Mediastinum/Nodes: No pathologically enlarged mediastinal, hilar or axillary lymph nodes. Esophagus is grossly unremarkable. Lungs/Pleura: Probable scarring in the posterior segment right upper lobe. Additional scattered pulmonary parenchymal scarring bilaterally. Lungs are otherwise clear. Calcified pleural plaques bilaterally. No pleural fluid. Airway is unremarkable. Musculoskeletal: Degenerative changes in the spine. No worrisome lytic or sclerotic lesions. Degenerative changes in both shoulders. CT ABDOMEN PELVIS FINDINGS Hepatobiliary: Liver is unremarkable. Cholecystectomy. No biliary ductal dilatation. Pancreas: Calcifications are seen in the pancreatic body. Spleen: Negative. Adrenals/Urinary Tract: Adrenal glands are unremarkable. Fluid density 3.7 cm low-attenuation lesion in the right kidney is likely a cyst. Additional subcentimeter low-attenuation lesions in the left kidney are too small to definitively characterize. Bilateral double-J ureteral stents are in place with moderate right hydronephrosis and mild left hydronephrosis. Bilateral periureteric stranding, right greater than left. Foley catheter is seen in a decompressed bladder. Bladder appears thick-walled with surrounding haziness. Stomach/Bowel: Stomach, small bowel, appendix and colon are unremarkable. Vascular/Lymphatic: Atherosclerotic calcification of the arterial  vasculature without abdominal aortic aneurysm. No pathologically enlarged lymph nodes. Reproductive: Prostate is visualized. Other: Mild presacral edema, as before. No free fluid. Mesenteries and peritoneum are otherwise unremarkable. Musculoskeletal: Advanced degenerative changes in the spine and hips. No worrisome lytic or sclerotic lesions. IMPRESSION: 1. Bilateral hydronephrosis, right greater than left, with double-J ureteral stents in place. 2. Bladder wall thickening and surrounding haziness, similar to 12/26/2017. Underlying malignancy is not excluded. 3. Aortic atherosclerosis (ICD10-170.0). Coronary artery calcification. 4. Asbestos related pleural disease. 5. Chronic calcific pancreatitis. Electronically Signed   By: Lorin Picket M.D.   On: 04/04/2018 14:13     Assessment and plan- Patient is a 83 y.o. 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 Erlene Quan today- cystoscopy still shows abnormal looking friable tissue in the bladder and prostate. Unclear how much of this is residual malignancy versus radiation changes since biopsy was not done at the time of cystoscopy. I will therefore proceed and complete 2 more remaining cycles of chemotherapy. Plan is to get possible biopsy at the time of stent exchange tentatively 8 weeks from now. He should be done with chemotherapy in about 6 weeks  Counts are ok to proceed with cycle 5 day 1 of gemcitabine/ carboplatin today. He will directly proceed for cycle 5 day 8 chemotherapy next week.   I will see him back in 3 weeks with cbc/ cmp for cycle 6 day 1 of chemotherapy with carboplatin/ gemcitabine   Total face to face encounter time for this patient visit was 30 min. >50% of the time was  spent in counseling and coordination of care.     Visit Diagnosis 1. Malignant neoplasm of urinary  bladder, unspecified site ([place])   2. Encounter for antineoplastic chemotherapy      Dr. Randa Evens, MD, MPH [facility] at [facility] [phone] 04/19/2018 11:29 AM

## 2018-04-19 NOTE — Progress Notes (Signed)
No new changes noted today 

## 2018-04-25 ENCOUNTER — Other Ambulatory Visit: Payer: Medicare Other | Admitting: Urology

## 2018-04-26 ENCOUNTER — Inpatient Hospital Stay: Payer: Medicare Other

## 2018-04-26 VITALS — BP 123/79 | HR 94 | Temp 96.5°F | Resp 18 | Wt 187.2 lb

## 2018-04-26 DIAGNOSIS — C679 Malignant neoplasm of bladder, unspecified: Secondary | ICD-10-CM

## 2018-04-26 LAB — CBC WITH DIFFERENTIAL/PLATELET
Basophils Absolute: 0 10*3/uL (ref 0–0.1)
Basophils Relative: 1 %
EOS PCT: 1 %
Eosinophils Absolute: 0 10*3/uL (ref 0–0.7)
HEMATOCRIT: 28.6 % — AB (ref 40.0–52.0)
Hemoglobin: 9.8 g/dL — ABNORMAL LOW (ref 13.0–18.0)
LYMPHS ABS: 1.4 10*3/uL (ref 1.0–3.6)
Lymphocytes Relative: 26 %
MCH: 31.2 pg (ref 26.0–34.0)
MCHC: 34.2 g/dL (ref 32.0–36.0)
MCV: 91.4 fL (ref 80.0–100.0)
MONO ABS: 0.4 10*3/uL (ref 0.2–1.0)
Monocytes Relative: 7 %
NEUTROS ABS: 3.6 10*3/uL (ref 1.4–6.5)
Neutrophils Relative %: 65 %
PLATELETS: 160 10*3/uL (ref 150–440)
RBC: 3.13 MIL/uL — ABNORMAL LOW (ref 4.40–5.90)
RDW: 19.7 % — AB (ref 11.5–14.5)
WBC: 5.4 10*3/uL (ref 3.8–10.6)

## 2018-04-26 LAB — COMPREHENSIVE METABOLIC PANEL
ALBUMIN: 2.9 g/dL — AB (ref 3.5–5.0)
ALT: 10 U/L — ABNORMAL LOW (ref 17–63)
ANION GAP: 9 (ref 5–15)
AST: 15 U/L (ref 15–41)
Alkaline Phosphatase: 58 U/L (ref 38–126)
BUN: 13 mg/dL (ref 6–20)
CHLORIDE: 109 mmol/L (ref 101–111)
CO2: 22 mmol/L (ref 22–32)
Calcium: 8.8 mg/dL — ABNORMAL LOW (ref 8.9–10.3)
Creatinine, Ser: 0.86 mg/dL (ref 0.61–1.24)
GFR calc Af Amer: >60 mL/min (ref >60)
GFR calc non Af Amer: >60 mL/min (ref >60)
GLUCOSE: 122 mg/dL — AB (ref 65–99)
POTASSIUM: 3.6 mmol/L (ref 3.5–5.1)
SODIUM: 140 mmol/L (ref 135–145)
Total Bilirubin: 0.2 mg/dL — ABNORMAL LOW (ref 0.3–1.2)
Total Protein: 6.2 g/dL — ABNORMAL LOW (ref 6.5–8.1)

## 2018-04-26 MED ORDER — PROCHLORPERAZINE MALEATE 10 MG PO TABS
10.0000 mg | ORAL_TABLET | Freq: Once | ORAL | Status: AC
  Administered 2018-04-26: 10 mg via ORAL
  Filled 2018-04-26: qty 1

## 2018-04-26 MED ORDER — SODIUM CHLORIDE 0.9 % IV SOLN
1600.0000 mg | Freq: Once | INTRAVENOUS | Status: AC
  Administered 2018-04-26: 1600 mg via INTRAVENOUS
  Filled 2018-04-26: qty 42.11

## 2018-04-26 MED ORDER — SODIUM CHLORIDE 0.9% FLUSH
10.0000 mL | Freq: Once | INTRAVENOUS | Status: AC
  Administered 2018-04-26: 10 mL via INTRAVENOUS
  Filled 2018-04-26: qty 10

## 2018-04-26 MED ORDER — SODIUM CHLORIDE 0.9 % IV SOLN
Freq: Once | INTRAVENOUS | Status: AC
  Administered 2018-04-26: 12:00:00 via INTRAVENOUS
  Filled 2018-04-26: qty 1000

## 2018-04-26 MED ORDER — CARBOPLATIN CHEMO INJECTION 450 MG/45ML
192.8000 mg | Freq: Once | INTRAVENOUS | Status: AC
  Administered 2018-04-26: 190 mg via INTRAVENOUS
  Filled 2018-04-26: qty 19

## 2018-04-26 MED ORDER — HEPARIN SOD (PORK) LOCK FLUSH 100 UNIT/ML IV SOLN
500.0000 [IU] | Freq: Once | INTRAVENOUS | Status: AC
  Administered 2018-04-26: 500 [IU] via INTRAVENOUS
  Filled 2018-04-26: qty 5

## 2018-05-01 ENCOUNTER — Emergency Department: Payer: Medicare Other

## 2018-05-01 ENCOUNTER — Other Ambulatory Visit: Payer: Self-pay

## 2018-05-01 ENCOUNTER — Inpatient Hospital Stay
Admission: EM | Admit: 2018-05-01 | Discharge: 2018-05-06 | DRG: 481 | Disposition: A | Discharge status: Skilled Nursing Facility | Payer: Medicare Other | Attending: Internal Medicine | Admitting: Internal Medicine

## 2018-05-01 ENCOUNTER — Encounter: Payer: Self-pay | Admitting: Emergency Medicine

## 2018-05-01 DIAGNOSIS — D638 Anemia in other chronic diseases classified elsewhere: Secondary | ICD-10-CM | POA: Diagnosis present

## 2018-05-01 DIAGNOSIS — W010XXA Fall on same level from slipping, tripping and stumbling without subsequent striking against object, initial encounter: Secondary | ICD-10-CM | POA: Diagnosis not present

## 2018-05-01 DIAGNOSIS — Z87440 Personal history of urinary (tract) infections: Secondary | ICD-10-CM | POA: Diagnosis not present

## 2018-05-01 DIAGNOSIS — Z936 Other artificial openings of urinary tract status: Secondary | ICD-10-CM

## 2018-05-01 DIAGNOSIS — M1990 Unspecified osteoarthritis, unspecified site: Secondary | ICD-10-CM | POA: Diagnosis present

## 2018-05-01 DIAGNOSIS — Z809 Family history of malignant neoplasm, unspecified: Secondary | ICD-10-CM

## 2018-05-01 DIAGNOSIS — C679 Malignant neoplasm of bladder, unspecified: Secondary | ICD-10-CM | POA: Diagnosis present

## 2018-05-01 DIAGNOSIS — E876 Hypokalemia: Secondary | ICD-10-CM | POA: Diagnosis present

## 2018-05-01 DIAGNOSIS — Z8249 Family history of ischemic heart disease and other diseases of the circulatory system: Secondary | ICD-10-CM | POA: Diagnosis not present

## 2018-05-01 DIAGNOSIS — Z8546 Personal history of malignant neoplasm of prostate: Secondary | ICD-10-CM | POA: Diagnosis not present

## 2018-05-01 DIAGNOSIS — W19XXXA Unspecified fall, initial encounter: Secondary | ICD-10-CM | POA: Diagnosis not present

## 2018-05-01 DIAGNOSIS — F419 Anxiety disorder, unspecified: Secondary | ICD-10-CM | POA: Diagnosis present

## 2018-05-01 DIAGNOSIS — Z9889 Other specified postprocedural states: Secondary | ICD-10-CM | POA: Diagnosis not present

## 2018-05-01 DIAGNOSIS — S72141A Displaced intertrochanteric fracture of right femur, initial encounter for closed fracture: Secondary | ICD-10-CM | POA: Diagnosis present

## 2018-05-01 DIAGNOSIS — M25551 Pain in right hip: Secondary | ICD-10-CM | POA: Diagnosis present

## 2018-05-01 DIAGNOSIS — Z79899 Other long term (current) drug therapy: Secondary | ICD-10-CM | POA: Diagnosis not present

## 2018-05-01 DIAGNOSIS — D701 Agranulocytosis secondary to cancer chemotherapy: Secondary | ICD-10-CM | POA: Diagnosis present

## 2018-05-01 DIAGNOSIS — Z9049 Acquired absence of other specified parts of digestive tract: Secondary | ICD-10-CM

## 2018-05-01 DIAGNOSIS — D6959 Other secondary thrombocytopenia: Secondary | ICD-10-CM | POA: Diagnosis present

## 2018-05-01 DIAGNOSIS — Z23 Encounter for immunization: Secondary | ICD-10-CM

## 2018-05-01 DIAGNOSIS — Y92009 Unspecified place in unspecified non-institutional (private) residence as the place of occurrence of the external cause: Secondary | ICD-10-CM

## 2018-05-01 DIAGNOSIS — D62 Acute posthemorrhagic anemia: Secondary | ICD-10-CM | POA: Diagnosis not present

## 2018-05-01 DIAGNOSIS — S72001A Fracture of unspecified part of neck of right femur, initial encounter for closed fracture: Secondary | ICD-10-CM

## 2018-05-01 DIAGNOSIS — M84551A Pathological fracture in neoplastic disease, right femur, initial encounter for fracture: Secondary | ICD-10-CM | POA: Diagnosis not present

## 2018-05-01 DIAGNOSIS — T451X5A Adverse effect of antineoplastic and immunosuppressive drugs, initial encounter: Secondary | ICD-10-CM | POA: Diagnosis present

## 2018-05-01 DIAGNOSIS — D6481 Anemia due to antineoplastic chemotherapy: Secondary | ICD-10-CM | POA: Diagnosis present

## 2018-05-01 DIAGNOSIS — I10 Essential (primary) hypertension: Secondary | ICD-10-CM | POA: Diagnosis present

## 2018-05-01 DIAGNOSIS — Z880 Allergy status to penicillin: Secondary | ICD-10-CM | POA: Diagnosis not present

## 2018-05-01 DIAGNOSIS — E538 Deficiency of other specified B group vitamins: Secondary | ICD-10-CM | POA: Diagnosis present

## 2018-05-01 DIAGNOSIS — D630 Anemia in neoplastic disease: Secondary | ICD-10-CM | POA: Diagnosis not present

## 2018-05-01 DIAGNOSIS — S72009A Fracture of unspecified part of neck of unspecified femur, initial encounter for closed fracture: Secondary | ICD-10-CM | POA: Diagnosis present

## 2018-05-01 DIAGNOSIS — D696 Thrombocytopenia, unspecified: Secondary | ICD-10-CM | POA: Diagnosis not present

## 2018-05-01 DIAGNOSIS — N400 Enlarged prostate without lower urinary tract symptoms: Secondary | ICD-10-CM | POA: Diagnosis present

## 2018-05-01 DIAGNOSIS — S7291XA Unspecified fracture of right femur, initial encounter for closed fracture: Secondary | ICD-10-CM

## 2018-05-01 HISTORY — DX: Unspecified fracture of right femur, initial encounter for closed fracture: S72.91XA

## 2018-05-01 LAB — URINALYSIS, ROUTINE W REFLEX MICROSCOPIC
Bilirubin Urine: NEGATIVE
GLUCOSE, UA: 50 mg/dL — AB
Ketones, ur: NEGATIVE mg/dL
NITRITE: NEGATIVE
PROTEIN: 100 mg/dL — AB
RBC / HPF: >50 RBC/hpf — ABNORMAL HIGH (ref 0–5)
SPECIFIC GRAVITY, URINE: 1.015 (ref 1.005–1.030)
Squamous Epithelial / LPF: NONE SEEN (ref 0–5)
WBC, UA: >50 WBC/hpf — ABNORMAL HIGH (ref 0–5)
pH: 6 (ref 5.0–8.0)

## 2018-05-01 MED ORDER — ACETAMINOPHEN 650 MG RE SUPP: 650.0000 mg | Freq: Four times a day (QID) | RECTAL | Status: DC | PRN

## 2018-05-01 MED ORDER — LORATADINE 10 MG PO TABS
10.0000 mg | ORAL_TABLET | Freq: Every day | ORAL | Status: DC
  Administered 2018-05-03 – 2018-05-06 (×4): 10 mg via ORAL
  Filled 2018-05-01 (×4): qty 1

## 2018-05-01 MED ORDER — TETANUS-DIPHTH-ACELL PERTUSSIS 5-2.5-18.5 LF-MCG/0.5 IM SUSP
0.5000 mL | Freq: Once | INTRAMUSCULAR | Status: AC
  Administered 2018-05-01: 0.5 mL via INTRAMUSCULAR
  Filled 2018-05-01: qty 0.5

## 2018-05-01 MED ORDER — CEFAZOLIN SODIUM-DEXTROSE 2-4 GM/100ML-% IV SOLN
2.0000 g | INTRAVENOUS | Status: AC
  Administered 2018-05-02: 2 g via INTRAVENOUS
  Filled 2018-05-01: qty 100

## 2018-05-01 MED ORDER — SODIUM CHLORIDE 0.9 % IV SOLN
INTRAVENOUS | Status: DC
  Administered 2018-05-01 – 2018-05-02 (×2): via INTRAVENOUS

## 2018-05-01 MED ORDER — HYDROCODONE-ACETAMINOPHEN 5-325 MG PO TABS
1.0000 | ORAL_TABLET | ORAL | Status: DC | PRN
  Administered 2018-05-01: 1 via ORAL
  Administered 2018-05-02 (×2): 2 via ORAL
  Administered 2018-05-02: 1 via ORAL
  Filled 2018-05-01: qty 2
  Filled 2018-05-01 (×2): qty 1
  Filled 2018-05-01: qty 2

## 2018-05-01 MED ORDER — ONDANSETRON HCL 4 MG/2ML IJ SOLN
4.0000 mg | Freq: Four times a day (QID) | INTRAMUSCULAR | Status: DC | PRN
  Administered 2018-05-02: 4 mg via INTRAVENOUS
  Filled 2018-05-01: qty 2

## 2018-05-01 MED ORDER — TAMSULOSIN HCL 0.4 MG PO CAPS
0.4000 mg | ORAL_CAPSULE | Freq: Two times a day (BID) | ORAL | Status: DC
  Administered 2018-05-01 – 2018-05-06 (×8): 0.4 mg via ORAL
  Filled 2018-05-01 (×8): qty 1

## 2018-05-01 MED ORDER — ONDANSETRON HCL 4 MG/2ML IJ SOLN
4.0000 mg | Freq: Once | INTRAMUSCULAR | Status: AC
  Administered 2018-05-01: 4 mg via INTRAVENOUS
  Filled 2018-05-01: qty 2

## 2018-05-01 MED ORDER — MIRABEGRON ER 50 MG PO TB24
50.0000 mg | ORAL_TABLET | Freq: Every day | ORAL | Status: DC
  Administered 2018-05-03 – 2018-05-06 (×4): 50 mg via ORAL
  Filled 2018-05-01 (×5): qty 1

## 2018-05-01 MED ORDER — ACETAMINOPHEN 325 MG PO TABS: 650.0000 mg | ORAL_TABLET | Freq: Four times a day (QID) | ORAL | Status: DC | PRN

## 2018-05-01 MED ORDER — ONDANSETRON HCL 4 MG PO TABS: 4.0000 mg | ORAL_TABLET | Freq: Four times a day (QID) | ORAL | Status: DC | PRN

## 2018-05-01 MED ORDER — MORPHINE SULFATE (PF) 4 MG/ML IV SOLN
4.0000 mg | Freq: Once | INTRAVENOUS | Status: AC
  Administered 2018-05-01: 4 mg via INTRAVENOUS
  Filled 2018-05-01: qty 1

## 2018-05-01 MED ORDER — HYDROMORPHONE HCL 1 MG/ML IJ SOLN
2.0000 mg | INTRAMUSCULAR | Status: DC | PRN
  Administered 2018-05-01: 2 mg via INTRAVENOUS
  Filled 2018-05-01: qty 2

## 2018-05-01 MED ORDER — FINASTERIDE 5 MG PO TABS
5.0000 mg | ORAL_TABLET | Freq: Every day | ORAL | Status: DC
  Administered 2018-05-03 – 2018-05-06 (×4): 5 mg via ORAL
  Filled 2018-05-01 (×4): qty 1

## 2018-05-01 NOTE — Progress Notes (Signed)
Advanced care plan.  Purpose of the Encounter: CODE STATUS  Parties in Attendance: Patient and his wife  Patient's Decision Capacity: Intact  Subjective/Patient's story: Patient is 83 year old with localized bladder cancer presenting with fall noted to have hip fracture.    Objective/Medical story I discussed with the patient regarding his wishes for CPR and intubation. Patient reports that he wants everything done and wants to live long as possible.   Goals of care determination:  Full code   CODE STATUS:  Full code Time spent discussing advanced care planning: 16 minutes

## 2018-05-01 NOTE — ED Provider Notes (Addendum)
[facility] Emergency Department Provider Note   ____________________________________________   First MD Initiated Contact with Patient 05/01/18 1258     (approximate)  I have reviewed the triage vital signs and the nursing notes.   HISTORY  Chief Complaint Knee Injury    HPI Vincent Poole is a 83 y.o. male who reports he tripped and fell landing on his right side.  He reports pain in his right upper femur if we tried to move his leg but there is no pain on palpation.  He also says he cannot straighten his left knee out.  There is no pain on palpation or swelling of the left knee.  Patient denies hitting his head denies seeing out he has no head or neck pain.  He did not complain of pain in the shoulder to me.  Does have the skin tear as nurse reported.  He has a history of frequently losing his balance and falling.   Past Medical History:  Diagnosis Date  . Anxiety   . Arthritis   . Bladder cancer ([place])   . Hypertension   . Prostate cancer ([place])   . Urinary retention 2019   foley catheter place 11/2017  . UTI (urinary tract infection) 2019   frequent UTI's over last year    Patient Active Problem List   Diagnosis Date Noted  . Hip fracture ([place]) 05/01/2018  . Malignant neoplasm of urinary bladder ([place]) 01/12/2018  . Goals of care, counseling/discussion 01/12/2018  . Pyelonephritis 05/31/2017  . UTI (urinary tract infection) 03/04/2017  . Urinary obstruction   . Essential hypertension 08/30/2016  . History of shingles 08/30/2016  . Prostate cancer ([place]) 08/30/2016  . Urinary retention 08/30/2016  . Medicare annual wellness visit, initial 08/01/2016  . Medicare annual wellness visit, subsequent 08/01/2016  . Sepsis ([place]) 07/11/2016  . Borderline diabetes mellitus 01/26/2016  . Vaccine counseling 01/26/2015  . Lumbar radiculitis 06/24/2014  . OA (osteoarthritis) of hip 06/24/2014  . Malignant neoplasm of prostate ([place]) 01/27/2011     Past Surgical History:  Procedure Laterality Date  . CARPAL TUNNEL RELEASE Right   . CHOLECYSTECTOMY  2004  . CYSTOSCOPY W/ URETERAL STENT PLACEMENT Bilateral 12/27/2017   Procedure: CYSTOSCOPY WITH RETROGRADE PYELOGRAM/URETERAL STENT PLACEMENT;  Surgeon: [name], MD;  Location: [facility] ORS;  Service: Urology;  Laterality: Bilateral;  . LEG TENDON SURGERY Right 1958  . PORTA CATH INSERTION N/A 01/22/2018   Procedure: PORTA CATH INSERTION;  Surgeon: [name], MD;  Location: [place] CV LAB;  Service: Cardiovascular;  Laterality: N/A;  . TRANSURETHRAL RESECTION OF BLADDER TUMOR N/A 12/27/2017   Procedure: TRANSURETHRAL RESECTION OF BLADDER TUMOR (TURBT);  Surgeon: [name], MD;  Location: [facility] ORS;  Service: Urology;  Laterality: N/A;  . TRANSURETHRAL RESECTION OF BLADDER TUMOR N/A 01/15/2018   Procedure: TRANSURETHRAL RESECTION OF BLADDER TUMOR (TURBT);  Surgeon: [name], MD;  Location: [facility] ORS;  Service: Urology;  Laterality: N/A;  Need 2 hrs for this case please    Prior to Admission medications   Medication Sig Start Date End Date Taking? Authorizing Provider  cetirizine (ZYRTEC) 10 MG tablet Take 10 mg by mouth daily.   Yes [provider]  CRANBERRY PO Take 1 tablet by mouth 2 (two) times daily.   Yes [provider]  finasteride (PROSCAR) 5 MG tablet Take 1 tablet (5 mg total) by mouth daily. 11/28/16  Yes Ardis Hughs, MD  mirabegron ER (MYRBETRIQ) 50 MG TB24 tablet Take  1 tablet (50 mg total) by mouth daily. 01/26/18  Yes [name], MD  tamsulosin (FLOMAX) 0.4 MG CAPS capsule Take 1 capsule (0.4 mg total) by mouth 2 (two) times daily. 12/08/17  Yes [name], MD  acetaminophen (TYLENOL) 500 MG tablet Take 1,000 mg by mouth 2 (two) times daily as needed for moderate pain or headache.     [provider]  dexamethasone (DECADRON) 4 MG tablet Take 2 tablets (8 mg total) by mouth daily. Start the day after carboplatin  chemotherapy for 2 days. Patient not taking: Reported on 04/04/2018 01/12/18   Sindy Guadeloupe, MD  HYDROcodone-acetaminophen (NORCO/VICODIN) 5-325 MG tablet Take 1 tablet by mouth every 6 (six) hours as needed for severe pain. Patient not taking: Reported on 02/27/2018 12/30/17   Hillary Bow, MD  lidocaine-prilocaine (EMLA) cream Apply small amount of cream over port site 1 12/ hours before each chemotherapy treatment. Place small piece of saran wrap over cream to protect clothing. Patient not taking: Reported on 03/20/2018 01/12/18   Sindy Guadeloupe, MD  lidocaine-prilocaine (EMLA) cream Apply to affected area once Patient taking differently: Apply 1 application topically as needed.  01/12/18   Sindy Guadeloupe, MD  LORazepam (ATIVAN) 0.5 MG tablet Take 1 tablet (0.5 mg total) by mouth every 6 (six) hours as needed (Nausea or vomiting). Patient not taking: Reported on 04/12/2018 01/12/18   Sindy Guadeloupe, MD  nystatin (MYCOSTATIN/NYSTOP) powder Apply topically 2 (two) times daily. Patient not taking: Reported on 04/19/2018 04/13/18   [name], MD  ondansetron (ZOFRAN) 8 MG tablet TAKE ONE TABLET TWICE DAILY AS NEEDED FOR NAUSEA / VOMITING START ON DAY 3 AFTER CARBOPLATIN CHEMO Patient not taking: Reported on 02/27/2018 02/13/18   Sindy Guadeloupe, MD  oxybutynin (DITROPAN) 5 MG tablet Take 1 tablet (5 mg total) by mouth 2 (two) times daily. Patient not taking: Reported on 05/01/2018 12/30/17   Hillary Bow, MD  polyethylene glycol powder (MIRALAX) powder Take 17 g by mouth daily as needed. Can increase to 3 times a day as needed for constipation but hold medication if has diarrhea Patient not taking: Reported on 04/12/2018 01/30/18   Sindy Guadeloupe, MD  prochlorperazine (COMPAZINE) 10 MG tablet Take 1 tablet (10 mg total) by mouth every 6 (six) hours as needed (Nausea or vomiting). Patient not taking: Reported on 02/27/2018 01/12/18   Sindy Guadeloupe, MD  senna-docusate (SENOKOT-S) 8.6-50 MG tablet Take 1 tablet by  mouth 2 (two) times daily. Patient not taking: Reported on 04/19/2018 01/30/18   Sindy Guadeloupe, MD    Allergies Penicillins  Family History  Problem Relation Age of Onset  . Cancer Mother   . Chronic Renal Failure Mother   . Heart disease Father     Social History Social History   Tobacco Use  . Smoking status: Never Smoker  . Smokeless tobacco: Never Used  Substance Use Topics  . Alcohol use: No    Alcohol/week: 0.0 oz  . Drug use: No    Review of Systems  Constitutional: No fever/chills Eyes: No visual changes. ENT: No sore throat. Cardiovascular: Denies chest pain. Respiratory: Denies shortness of breath. Gastrointestinal: No abdominal pain.  No nausea, no vomiting.  No diarrhea.  No constipation. Genitourinary: Negative for dysuria patient does have an indwelling Foley. Musculoskeletal: Negative for back pain. Skin: Negative for rash. Neurological: Negative for headaches, focal weakness   ____________________________________________   PHYSICAL EXAM:  VITAL SIGNS: ED Triage Vitals [05/01/18 1300]  Enc  Vitals Group     BP 113/79     Pulse Rate 89     Resp 16     Temp 98.2 F (36.8 C)     Temp Source Oral     SpO2 100 %     Weight 187 lb (84.8 kg)     Height 5\' 11"  (1.803 m)     Head Circumference      Peak Flow      Pain Score 10     Pain Loc      Pain Edu?      Excl. in [place]?    Constitutional: Alert and oriented. Well appearing and in no acute distress but very anxious. Eyes: Conjunctivae are normal. PERRL. EOMI. Head: Atraumatic. Nose: No congestion/rhinnorhea. Mouth/Throat: Mucous membranes are moist.  Oropharynx non-erythematous. Neck: No stridor.  No cervical spine tenderness to palpation. Cardiovascular: Normal rate, regular rhythm. Grossly normal heart sounds.  Good peripheral circulation. Respiratory: Normal respiratory effort.  No retractions. Lungs CTAB. Gastrointestinal: Soft and nontender. No distention. No abdominal bruits. No CVA  tenderness. Musculoskeletal: Left knee is not tender not swollen but patient says he can's could not straighten it.  Right hip and femur not tender to palpation but if I try to raise the leg by lifting his ankle he complains of severe pain with minimal movement in the anterior proximal femur.  Neurovascularly intact bilaterally in the feet.  She has good range of motion of the right shoulder though he complains of a little bit of pain when he moves it.  Neurologic:  Normal speech and language. No gross focal neurologic deficits are appreciated.  Skin: There is a skin tear on the right forearm over the ulnar surface at least 2 cm wide and 4 cm long. Psychiatric: Mood and affect are normal. Speech and behavior are normal.  ____________________________________________   LABS (all labs ordered are listed, but only abnormal results are displayed)  Labs Reviewed  URINALYSIS, ROUTINE W REFLEX MICROSCOPIC  CBC  BASIC METABOLIC PANEL   ____________________________________________  EKG G read and interpreted by me as a very irregular baseline which the computer is calling a flutter and not sure that it is I think it sinus rhythm at a rate of 92 with a normal axis nonspecific ST-T wave changes computer is also reading borderline prolonged QT interval with QTC of 477 ms  ____________________________________________  RADIOLOGY  ED MD interpretation the x-ray x-rays read by radiology reviewed by me show an intertrochanteric fracture of the right hip.  No other pathology except for degenerative joint disease.  Official radiology report(s): Dg Chest 1 View  Result Date: 05/01/2018 CLINICAL DATA:  Preoperative chest radiograph prior to RIGHT femur surgery. EXAM: CHEST  1 VIEW COMPARISON:  04/04/2018 and prior studies FINDINGS: Cardiomegaly noted. A RIGHT IJ Port-A-Cath is noted with tip overlying the mid SVC. There is no evidence of focal airspace disease, pulmonary edema, suspicious pulmonary  nodule/mass, pleural effusion, or pneumothorax. No acute bony abnormalities are identified. Degenerative changes in the shoulders noted. IMPRESSION: Cardiomegaly without evidence of active cardiopulmonary disease. Electronically Signed   By: Margarette Canada M.D.   On: 05/01/2018 14:25   Dg Pelvis 1-2 Views  Result Date: 05/01/2018 CLINICAL DATA:  Status post fall at home today. Patient is complaining of hip and thigh pain and is unable to internally rotate the leg. EXAM: PELVIS - 1-2 VIEW COMPARISON:  Right femur series of today's date FINDINGS: The bony pelvis is subjectively osteopenic. There is severe  joint space loss of the right hip with irregularity of the femoral head. There is abnormal lucency in the intertrochanteric region consistent with an acute fracture. There are degenerative changes of the left hip. Double pigtail ureteral stents are present. IMPRESSION: Acute intertrochanteric fracture of the right hip. No definite acute pelvic fracture. 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 Electronically Signed   By: David  Martinique M.D.   On: 05/01/2018 13:54   Dg Knee Complete 4 Views Left  Result Date: 05/01/2018 CLINICAL DATA:  Left knee pain following a fall at home today. EXAM: LEFT KNEE - COMPLETE 4+ VIEW COMPARISON:  None. FINDINGS: The bones are subjectively mildly osteopenic. There is mild narrowing of the medial joint compartment. There is no acute tibial plateau fracture nor other acute fracture. There is mild beaking of the tibial spines. There is spurring of the superior and inferior articular margins of the patella. The visualized portions of the fibula are intact. There is no joint effusion. IMPRESSION: Mild degenerative change of the left knee. No acute fracture or dislocation. Electronically Signed   By: David  Martinique M.D.   On: 05/01/2018 13:57   Dg Femur Min 2 Views Right  Result Date: 05/01/2018 CLINICAL DATA:  The patient has sustained a fall at home  today and is complaining of right hip and thigh pain and is unable to internally rotate the leg. EXAM: RIGHT FEMUR 2 VIEWS COMPARISON:  AP pelvis of today's date FINDINGS: There is an acute intertrochanteric fracture of the right femur. The femoral head and neck 0 grossly intact. There is severe osteoarthritic joint space loss of the right hip. The sub trochanteric region and remainder of the shaft and distal femur exhibit no acute abnormalities. IMPRESSION: There is an acute intertrochanteric fracture of the right hip. No fracture is seen more distally. Electronically Signed   By: David  Martinique M.D.   On: 05/01/2018 13:55    ____________________________________________   PROCEDURES  Procedure(s) performed:   Procedures  Critical Care performed:  ____________________________________________   INITIAL IMPRESSION / ASSESSMENT AND PLAN / ED COURSE   Reexamined the patient show his left knee is no longer tender he can move it although it aches a little bit.  Pulses still intact distally right leg is more tender pulses still present x-ray looks like he is got intertrochanter fracture.  I have paged Dr. Marry Guan.         ____________________________________________   FINAL CLINICAL IMPRESSION(S) / ED DIAGNOSES  Final diagnoses:  Closed fracture of right hip, initial encounter [facility])     ED Discharge Orders    None       Note:  This document was prepared using Dragon voice recognition software and may include unintentional dictation errors.    Nena Polio, MD 05/01/18 1403    Nena Polio, MD 05/01/18 1806

## 2018-05-01 NOTE — Consult Note (Signed)
ORTHOPAEDIC CONSULTATION  PATIENT NAME: Vincent Poole DOB: [birth year]  MRN: [id number]  REQUESTING PHYSICIAN: Dustin Flock, MD  Chief Complaint: Right hip pain  HPI: HERSHELL BRANDL is a 83 y.o. male who sustained a mechanical fall earlier today and landed on his right hip and side.  He was unable to stand or bear weight due to the right hip pain.  He denied any other injuries.  He denied any loss of consciousness.  Was prior to the fall he was ambulatory with a cane but did state that he tired easily.  Past Medical History:  Diagnosis Date  . Anxiety   . Arthritis   . Bladder cancer ([place])   . Hypertension   . Prostate cancer ([place])   . Urinary retention 2019   foley catheter place 11/2017  . UTI (urinary tract infection) 2019   frequent UTI's over last year   Past Surgical History:  Procedure Laterality Date  . CARPAL TUNNEL RELEASE Right   . CHOLECYSTECTOMY  2004  . CYSTOSCOPY W/ URETERAL STENT PLACEMENT Bilateral 12/27/2017   Procedure: CYSTOSCOPY WITH RETROGRADE PYELOGRAM/URETERAL STENT PLACEMENT;  Surgeon: [name], MD;  Location: [facility];  Service: Urology;  Laterality: Bilateral;  . LEG TENDON SURGERY Right 1958  . PORTA CATH INSERTION N/A 01/22/2018   Procedure: PORTA CATH INSERTION;  Surgeon: [name], MD;  Location: [facility];  Service: Cardiovascular;  Laterality: N/A;  . TRANSURETHRAL RESECTION OF BLADDER TUMOR N/A 12/27/2017   Procedure: TRANSURETHRAL RESECTION OF BLADDER TUMOR (TURBT);  Surgeon: [name], MD;  Location: [facility];  Service: Urology;  Laterality: N/A;  . TRANSURETHRAL RESECTION OF BLADDER TUMOR N/A 01/15/2018   Procedure: TRANSURETHRAL RESECTION OF BLADDER TUMOR (TURBT);  Surgeon: [name], MD;  Location: [facility];  Service: Urology;  Laterality: N/A;  Need 2 hrs for this case please   Social History   Socioeconomic History  . Marital status: Married    Spouse name: Not on file  . Number of children: Not on  file  . Years of education: Not on file  . Highest education level: Not on file  Occupational History  . Occupation: retired  Scientific laboratory technician  . Financial resource strain: Not on file  . Food insecurity:    Worry: Not on file    Inability: Not on file  . Transportation needs:    Medical: Not on file    Non-medical: Not on file  Tobacco Use  . Smoking status: Never Smoker  . Smokeless tobacco: Never Used  Substance and Sexual Activity  . Alcohol use: No    Alcohol/week: 0.0 oz  . Drug use: No  . Sexual activity: Never  Lifestyle  . Physical activity:    Days per week: Not on file    Minutes per session: Not on file  . Stress: Not on file  Relationships  . Social connections:    Talks on phone: Not on file    Gets together: Not on file    Attends religious service: Not on file    Active member of club or organization: Not on file    Attends meetings of clubs or organizations: Not on file    Relationship status: Not on file  Other Topics Concern  . Not on file  Social History Narrative  . Not on file   Family History  Problem Relation Age of Onset  . Cancer Mother   . Chronic Renal Failure Mother   . Heart disease Father  Allergies  Allergen Reactions  . Penicillins Other (See Comments)    Caused nervousness as a child. Patient states he took as a teenager w/o problems Has patient had a PCN reaction causing immediate rash, facial/tongue/throat swelling, SOB or lightheadedness with hypotension: No Has patient had a PCN reaction causing severe rash involving mucus membranes or skin necrosis: No Has patient had a PCN reaction that required hospitalization: No Has patient had a PCN reaction occurring within the last 10 years: No If all of the above answers are "NO", then may proceed with   Prior to Admission medications   Medication Sig Start Date End Date Taking? Authorizing Provider  cetirizine (ZYRTEC) 10 MG tablet Take 10 mg by mouth daily.   Yes [provider]  CRANBERRY PO Take 1 tablet by mouth 2 (two) times daily.   Yes [provider]  finasteride (PROSCAR) 5 MG tablet Take 1 tablet (5 mg total) by mouth daily. 11/28/16  Yes Ardis Hughs, MD  mirabegron ER (MYRBETRIQ) 50 MG TB24 tablet Take 1 tablet (50 mg total) by mouth daily. 01/26/18  Yes [name], MD  tamsulosin (FLOMAX) 0.4 MG CAPS capsule Take 1 capsule (0.4 mg total) by mouth 2 (two) times daily. 12/08/17  Yes [name], MD  acetaminophen (TYLENOL) 500 MG tablet Take 1,000 mg by mouth 2 (two) times daily as needed for moderate pain or headache.     [provider]  dexamethasone (DECADRON) 4 MG tablet Take 2 tablets (8 mg total) by mouth daily. Start the day after carboplatin chemotherapy for 2 days. Patient not taking: Reported on 04/04/2018 01/12/18   Sindy Guadeloupe, MD  HYDROcodone-acetaminophen (NORCO/VICODIN) 5-325 MG tablet Take 1 tablet by mouth every 6 (six) hours as needed for severe pain. Patient not taking: Reported on 02/27/2018 12/30/17   Hillary Bow, MD  lidocaine-prilocaine (EMLA) cream Apply small amount of cream over port site 1 12/ hours before each chemotherapy treatment. Place small piece of saran wrap over cream to protect clothing. Patient not taking: Reported on 03/20/2018 01/12/18   Sindy Guadeloupe, MD  lidocaine-prilocaine (EMLA) cream Apply to affected area once Patient taking differently: Apply 1 application topically as needed.  01/12/18   Sindy Guadeloupe, MD  LORazepam (ATIVAN) 0.5 MG tablet Take 1 tablet (0.5 mg total) by mouth every 6 (six) hours as needed (Nausea or vomiting). Patient not taking: Reported on 04/12/2018 01/12/18   Sindy Guadeloupe, MD  nystatin (MYCOSTATIN/NYSTOP) powder Apply topically 2 (two) times daily. Patient not taking: Reported on 04/19/2018 04/13/18   [name], MD  ondansetron (ZOFRAN) 8 MG tablet TAKE ONE TABLET TWICE DAILY AS NEEDED FOR NAUSEA / VOMITING START ON DAY 3 AFTER CARBOPLATIN  CHEMO Patient not taking: Reported on 02/27/2018 02/13/18   Sindy Guadeloupe, MD  oxybutynin (DITROPAN) 5 MG tablet Take 1 tablet (5 mg total) by mouth 2 (two) times daily. Patient not taking: Reported on 05/01/2018 12/30/17   Hillary Bow, MD  polyethylene glycol powder (MIRALAX) powder Take 17 g by mouth daily as needed. Can increase to 3 times a day as needed for constipation but hold medication if has diarrhea Patient not taking: Reported on 04/12/2018 01/30/18   Sindy Guadeloupe, MD  prochlorperazine (COMPAZINE) 10 MG tablet Take 1 tablet (10 mg total) by mouth every 6 (six) hours as needed (Nausea or vomiting). Patient not taking: Reported on 02/27/2018 01/12/18   Sindy Guadeloupe, MD  senna-docusate (SENOKOT-S) 8.6-50 MG tablet Take 1  tablet by mouth 2 (two) times daily. Patient not taking: Reported on 04/19/2018 01/30/18   Sindy Guadeloupe, MD   Dg Chest 1 View  Result Date: 05/01/2018 CLINICAL DATA:  Preoperative chest radiograph prior to RIGHT femur surgery. EXAM: CHEST  1 VIEW COMPARISON:  04/04/2018 and prior studies FINDINGS: Cardiomegaly noted. A RIGHT IJ Port-A-Cath is noted with tip overlying the mid SVC. There is no evidence of focal airspace disease, pulmonary edema, suspicious pulmonary nodule/mass, pleural effusion, or pneumothorax. No acute bony abnormalities are identified. Degenerative changes in the shoulders noted. IMPRESSION: Cardiomegaly without evidence of active cardiopulmonary disease. Electronically Signed   By: Margarette Canada M.D.   On: 05/01/2018 14:25   Dg Pelvis 1-2 Views  Result Date: 05/01/2018 CLINICAL DATA:  Status post fall at home today. Patient is complaining of hip and thigh pain and is unable to internally rotate the leg. EXAM: PELVIS - 1-2 VIEW COMPARISON:  Right femur series of today's date FINDINGS: The bony pelvis is subjectively osteopenic. There is severe joint space loss of the right hip with irregularity of the femoral head. There is abnormal lucency in the  intertrochanteric region consistent with an acute fracture. There are degenerative changes of the left hip. Double pigtail ureteral stents are present. IMPRESSION: Acute intertrochanteric fracture of the right hip. No definite acute pelvic fracture. 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 Electronically Signed   By: David  Martinique M.D.   On: 05/01/2018 13:54   Dg Knee Complete 4 Views Left  Result Date: 05/01/2018 CLINICAL DATA:  Left knee pain following a fall at home today. EXAM: LEFT KNEE - COMPLETE 4+ VIEW COMPARISON:  None. FINDINGS: The bones are subjectively mildly osteopenic. There is mild narrowing of the medial joint compartment. There is no acute tibial plateau fracture nor other acute fracture. There is mild beaking of the tibial spines. There is spurring of the superior and inferior articular margins of the patella. The visualized portions of the fibula are intact. There is no joint effusion. IMPRESSION: Mild degenerative change of the left knee. No acute fracture or dislocation. Electronically Signed   By: David  Martinique M.D.   On: 05/01/2018 13:57   Dg Femur Min 2 Views Right  Result Date: 05/01/2018 CLINICAL DATA:  The patient has sustained a fall at home today and is complaining of right hip and thigh pain and is unable to internally rotate the leg. EXAM: RIGHT FEMUR 2 VIEWS COMPARISON:  AP pelvis of today's date FINDINGS: There is an acute intertrochanteric fracture of the right femur. The femoral head and neck 0 grossly intact. There is severe osteoarthritic joint space loss of the right hip. The sub trochanteric region and remainder of the shaft and distal femur exhibit no acute abnormalities. IMPRESSION: There is an acute intertrochanteric fracture of the right hip. No fracture is seen more distally. Electronically Signed   By: David  Martinique M.D.   On: 05/01/2018 13:55    Positive ROS: All other systems have been reviewed and were otherwise negative  with the exception of those mentioned in the HPI and as above.  Physical Exam: General: Well developed, well nourished male seen in no acute distress. HEENT: Atraumatic and normocephalic. Sclera are clear. Extraocular motion is intact. Oropharynx is clear with moist mucosa. Neck: Supple, nontender, good range of motion. No JVD or carotid bruits. Lungs: Clear to auscultation bilaterally. Cardiovascular: Regular rate and rhythm with normal S1 and S2. No murmurs. No gallops or rubs.  Pedal pulses are palpable bilaterally. Homans test is negative bilaterally. No significant pretibial or ankle edema. Abdomen: Soft, nontender, and nondistended. Bowel sounds are present. Skin: No lesions in the area of chief complaint Neurologic: Awake, alert, and oriented. Sensory function is grossly intact. Motor strength is felt to be 5 over 5 bilaterally with the exception of the right lower extremity that was not assessed due to the injury.  The patient is hard of hearing.  Positive tremor. Good motor coordination. Lymphatic: No axillary or cervical lymphadenopathy  MUSCULOSKELETAL: The right lower extremity is shortened and externally rotated.  Hip pain is elicited with any attempted range of motion.  No apparent ecchymosis or erythema about the hip or thigh.  Mild swelling to the thigh is noted.  No knee effusion.  No tenderness to palpation about the knee or ankle.  Assessment: Right intertrochanteric femur fracture  Plan: The findings were discussed in detail with the patient and his family.  Recommendation was made for open reduction and internal fixation of the right intertrochanteric femur fracture. The usual perioperative course was discussed. The risks and benefits of surgical intervention were reviewed. The patient expressed understanding of the risks and benefits and agreed with plans for surgical intervention.   The surgical site was signed as per the "right site surgery" protocol.   Jilleen Essner P. Holley Bouche M.D.

## 2018-05-01 NOTE — ED Notes (Signed)
Patient transported to [place] by Wilfred Lacy, EDT and [employer], [employer]

## 2018-05-01 NOTE — H&P (Signed)
[facility] [facility] [place] NAME: Vincent Poole    MR#:  [id number]  DATE OF BIRTH:  02-19-1935  DATE OF ADMISSION:  05/01/2018  PRIMARY CARE PHYSICIAN: [name], MD   REQUESTING/REFERRING PHYSICIAN: Nena Polio, MD  CHIEF COMPLAINT:   Chief Complaint  Patient presents with  . Knee Injury    HISTORY OF PRESENT ILLNESS: Vincent Poole  is a 83 y.o. male with a known history of anxiety, bladder cancer currently undergoing chemo, essential hypertension who is presenting after a fall.  Patient states that he does not recall what happened but may have lost balance.  He has been doing well and has not had any dizziness or syncope.  Patient denies any chest pain or shortness of breath.  He uses a cane to walk. Currently has severe hip pain.     PAST MEDICAL HISTORY:   Past Medical History:  Diagnosis Date  . Anxiety   . Arthritis   . Bladder cancer ([place])   . Hypertension   . Prostate cancer ([place])   . Urinary retention 2019   foley catheter place 11/2017  . UTI (urinary tract infection) 2019   frequent UTI's over last year    PAST SURGICAL HISTORY:  Past Surgical History:  Procedure Laterality Date  . CARPAL TUNNEL RELEASE Right   . CHOLECYSTECTOMY  2004  . CYSTOSCOPY W/ URETERAL STENT PLACEMENT Bilateral 12/27/2017   Procedure: CYSTOSCOPY WITH RETROGRADE PYELOGRAM/URETERAL STENT PLACEMENT;  Surgeon: [name], MD;  Location: [facility];  Service: Urology;  Laterality: Bilateral;  . LEG TENDON SURGERY Right 1958  . PORTA CATH INSERTION N/A 01/22/2018   Procedure: PORTA CATH INSERTION;  Surgeon: [name], MD;  Location: [facility];  Service: Cardiovascular;  Laterality: N/A;  . TRANSURETHRAL RESECTION OF BLADDER TUMOR N/A 12/27/2017   Procedure: TRANSURETHRAL RESECTION OF BLADDER TUMOR (TURBT);  Surgeon: [name], MD;  Location: [facility];  Service: Urology;  Laterality: N/A;  . TRANSURETHRAL RESECTION OF  BLADDER TUMOR N/A 01/15/2018   Procedure: TRANSURETHRAL RESECTION OF BLADDER TUMOR (TURBT);  Surgeon: [name], MD;  Location: [facility];  Service: Urology;  Laterality: N/A;  Need 2 hrs for this case please    SOCIAL HISTORY:  Social History   Tobacco Use  . Smoking status: Never Smoker  . Smokeless tobacco: Never Used  Substance Use Topics  . Alcohol use: No    Alcohol/week: 0.0 oz    FAMILY HISTORY:  Family History  Problem Relation Age of Onset  . Cancer Mother   . Chronic Renal Failure Mother   . Heart disease Father     DRUG ALLERGIES:  Allergies  Allergen Reactions  . Penicillins Other (See Comments)    Caused nervousness as a child. Patient states he took as a teenager w/o problems Has patient had a PCN reaction causing immediate rash, facial/tongue/throat swelling, SOB or lightheadedness with hypotension: No Has patient had a PCN reaction causing severe rash involving mucus membranes or skin necrosis: No Has patient had a PCN reaction that required hospitalization: No Has patient had a PCN reaction occurring within the last 10 years: No If all of the above answers are "NO", then may proceed with    REVIEW OF SYSTEMS:   CONSTITUTIONAL: No fever, fatigue or weakness.  EYES: No blurred or double vision.  EARS, NOSE, AND THROAT: No tinnitus or ear pain.  RESPIRATORY: No cough, shortness of breath, wheezing or hemoptysis.  CARDIOVASCULAR: No chest  pain, orthopnea, edema.  GASTROINTESTINAL: No nausea, vomiting, diarrhea or abdominal pain.  GENITOURINARY: No dysuria, hematuria.  ENDOCRINE: No polyuria, nocturia,  HEMATOLOGY: No anemia, easy bruising or bleeding SKIN: No rash or lesion. MUSCULOSKELETAL: No joint pain or arthritis.  Positive hip pain NEUROLOGIC: No tingling, numbness, weakness.  PSYCHIATRY: No anxiety or depression.   MEDICATIONS AT HOME:  Prior to Admission medications   Medication Sig Start Date End Date Taking? Authorizing Provider   cetirizine (ZYRTEC) 10 MG tablet Take 10 mg by mouth daily.   Yes [provider]  CRANBERRY PO Take 1 tablet by mouth 2 (two) times daily.   Yes [provider]  finasteride (PROSCAR) 5 MG tablet Take 1 tablet (5 mg total) by mouth daily. 11/28/16  Yes Ardis Hughs, MD  mirabegron ER (MYRBETRIQ) 50 MG TB24 tablet Take 1 tablet (50 mg total) by mouth daily. 01/26/18  Yes [name], MD  tamsulosin (FLOMAX) 0.4 MG CAPS capsule Take 1 capsule (0.4 mg total) by mouth 2 (two) times daily. 12/08/17  Yes [name], MD  acetaminophen (TYLENOL) 500 MG tablet Take 1,000 mg by mouth 2 (two) times daily as needed for moderate pain or headache.     [provider]  dexamethasone (DECADRON) 4 MG tablet Take 2 tablets (8 mg total) by mouth daily. Start the day after carboplatin chemotherapy for 2 days. Patient not taking: Reported on 04/04/2018 01/12/18   Sindy Guadeloupe, MD  HYDROcodone-acetaminophen (NORCO/VICODIN) 5-325 MG tablet Take 1 tablet by mouth every 6 (six) hours as needed for severe pain. Patient not taking: Reported on 02/27/2018 12/30/17   Hillary Bow, MD  lidocaine-prilocaine (EMLA) cream Apply small amount of cream over port site 1 12/ hours before each chemotherapy treatment. Place small piece of saran wrap over cream to protect clothing. Patient not taking: Reported on 03/20/2018 01/12/18   Sindy Guadeloupe, MD  lidocaine-prilocaine (EMLA) cream Apply to affected area once Patient taking differently: Apply 1 application topically as needed.  01/12/18   Sindy Guadeloupe, MD  LORazepam (ATIVAN) 0.5 MG tablet Take 1 tablet (0.5 mg total) by mouth every 6 (six) hours as needed (Nausea or vomiting). Patient not taking: Reported on 04/12/2018 01/12/18   Sindy Guadeloupe, MD  nystatin (MYCOSTATIN/NYSTOP) powder Apply topically 2 (two) times daily. Patient not taking: Reported on 04/19/2018 04/13/18   [name], MD  ondansetron (ZOFRAN) 8 MG tablet TAKE ONE TABLET TWICE  DAILY AS NEEDED FOR NAUSEA / VOMITING START ON DAY 3 AFTER CARBOPLATIN CHEMO Patient not taking: Reported on 02/27/2018 02/13/18   Sindy Guadeloupe, MD  oxybutynin (DITROPAN) 5 MG tablet Take 1 tablet (5 mg total) by mouth 2 (two) times daily. Patient not taking: Reported on 05/01/2018 12/30/17   Hillary Bow, MD  polyethylene glycol powder (MIRALAX) powder Take 17 g by mouth daily as needed. Can increase to 3 times a day as needed for constipation but hold medication if has diarrhea Patient not taking: Reported on 04/12/2018 01/30/18   Sindy Guadeloupe, MD  prochlorperazine (COMPAZINE) 10 MG tablet Take 1 tablet (10 mg total) by mouth every 6 (six) hours as needed (Nausea or vomiting). Patient not taking: Reported on 02/27/2018 01/12/18   Sindy Guadeloupe, MD  senna-docusate (SENOKOT-S) 8.6-50 MG tablet Take 1 tablet by mouth 2 (two) times daily. Patient not taking: Reported on 04/19/2018 01/30/18   Sindy Guadeloupe, MD      PHYSICAL EXAMINATION:   VITAL SIGNS: Blood pressure 113/79, pulse  89, temperature 98.2 F (36.8 C), temperature source Oral, resp. rate 16, height 5\' 11"  (1.803 m), weight 84.8 kg (187 lb), SpO2 100 %.  GENERAL:  83 y.o.-year-old patient lying in the bed with no acute distress.  EYES: Pupils equal, round, reactive to light and accommodation. No scleral icterus. Extraocular muscles intact.  HEENT: Head atraumatic, normocephalic. Oropharynx and nasopharynx clear.  NECK:  Supple, no jugular venous distention. No thyroid enlargement, no tenderness.  LUNGS: Normal breath sounds bilaterally, no wheezing, rales,rhonchi or crepitation. No use of accessory muscles of respiration.  CARDIOVASCULAR: S1, S2 normal. No murmurs, rubs, or gallops.  ABDOMEN: Soft, nontender, nondistended. Bowel sounds present. No organomegaly or mass.  EXTREMITIES: No pedal edema, cyanosis, or clubbing.  NEUROLOGIC: Cranial nerves II through XII are intact. Muscle strength 5/5 in all extremities. Sensation intact. Gait  not checked.  PSYCHIATRIC: The patient is alert and oriented x 3.  SKIN: No obvious rash, lesion, or ulcer.   LABORATORY PANEL:   CBC Recent Labs  Lab 04/26/18 1102  WBC 5.4  HGB 9.8*  HCT 28.6*  PLT 160  MCV 91.4  MCH 31.2  MCHC 34.2  RDW 19.7*  LYMPHSABS 1.4  MONOABS 0.4  EOSABS 0.0  BASOSABS 0.0   ------------------------------------------------------------------------------------------------------------------  Chemistries  Recent Labs  Lab 04/26/18 1102  NA 140  K 3.6  CL 109  CO2 22  GLUCOSE 122*  BUN 13  CREATININE 0.86  CALCIUM 8.8*  AST 15  ALT 10*  ALKPHOS 58  BILITOT 0.2*   ------------------------------------------------------------------------------------------------------------------ estimated creatinine clearance is 71.7 mL/min (by C-G formula based on SCr of 0.86 mg/dL). ------------------------------------------------------------------------------------------------------------------ No results for input(s): TSH, T4TOTAL, T3FREE, THYROIDAB in the last 72 hours.  Invalid input(s): FREET3   Coagulation profile No results for input(s): INR, PROTIME in the last 168 hours. ------------------------------------------------------------------------------------------------------------------- No results for input(s): DDIMER in the last 72 hours. -------------------------------------------------------------------------------------------------------------------  Cardiac Enzymes No results for input(s): CKMB, TROPONINI, MYOGLOBIN in the last 168 hours.  Invalid input(s): CK ------------------------------------------------------------------------------------------------------------------ Invalid input(s): POCBNP  ---------------------------------------------------------------------------------------------------------------  Urinalysis    Component Value Date/Time   COLORURINE AMBER (A) 12/26/2017 1607   APPEARANCEUR TURBID (A) 12/26/2017 1607    APPEARANCEUR Cloudy (A) 11/28/2017 1118   LABSPEC 1.015 12/26/2017 1607   PHURINE 8.0 12/26/2017 1607   GLUCOSEU NEGATIVE 12/26/2017 1607   HGBUR NEGATIVE 12/26/2017 1607   BILIRUBINUR MODERATE (A) 12/26/2017 1607   BILIRUBINUR Negative 11/28/2017 1118   KETONESUR NEGATIVE 12/26/2017 1607   PROTEINUR >=300 (A) 12/26/2017 1607   NITRITE NEGATIVE 12/26/2017 1607   LEUKOCYTESUR SMALL (A) 12/26/2017 1607   LEUKOCYTESUR 1+ (A) 11/28/2017 1118     RADIOLOGY: Dg Chest 1 View  Result Date: 05/01/2018 CLINICAL DATA:  Preoperative chest radiograph prior to RIGHT femur surgery. EXAM: CHEST  1 VIEW COMPARISON:  04/04/2018 and prior studies FINDINGS: Cardiomegaly noted. A RIGHT IJ Port-A-Cath is noted with tip overlying the mid SVC. There is no evidence of focal airspace disease, pulmonary edema, suspicious pulmonary nodule/mass, pleural effusion, or pneumothorax. No acute bony abnormalities are identified. Degenerative changes in the shoulders noted. IMPRESSION: Cardiomegaly without evidence of active cardiopulmonary disease. Electronically Signed   By: Margarette Canada M.D.   On: 05/01/2018 14:25   Dg Pelvis 1-2 Views  Result Date: 05/01/2018 CLINICAL DATA:  Status post fall at home today. Patient is complaining of hip and thigh pain and is unable to internally rotate the leg. EXAM: PELVIS - 1-2 VIEW COMPARISON:  Right femur series of today's date FINDINGS: The  bony pelvis is subjectively osteopenic. There is severe joint space loss of the right hip with irregularity of the femoral head. There is abnormal lucency in the intertrochanteric region consistent with an acute fracture. There are degenerative changes of the left hip. Double pigtail ureteral stents are present. IMPRESSION: Acute intertrochanteric fracture of the right hip. No definite acute pelvic fracture. 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 Electronically Signed   By: David  Martinique M.D.   On: 05/01/2018 13:54    Dg Knee Complete 4 Views Left  Result Date: 05/01/2018 CLINICAL DATA:  Left knee pain following a fall at home today. EXAM: LEFT KNEE - COMPLETE 4+ VIEW COMPARISON:  None. FINDINGS: The bones are subjectively mildly osteopenic. There is mild narrowing of the medial joint compartment. There is no acute tibial plateau fracture nor other acute fracture. There is mild beaking of the tibial spines. There is spurring of the superior and inferior articular margins of the patella. The visualized portions of the fibula are intact. There is no joint effusion. IMPRESSION: Mild degenerative change of the left knee. No acute fracture or dislocation. Electronically Signed   By: David  Martinique M.D.   On: 05/01/2018 13:57   Dg Femur Min 2 Views Right  Result Date: 05/01/2018 CLINICAL DATA:  The patient has sustained a fall at home today and is complaining of right hip and thigh pain and is unable to internally rotate the leg. EXAM: RIGHT FEMUR 2 VIEWS COMPARISON:  AP pelvis of today's date FINDINGS: There is an acute intertrochanteric fracture of the right femur. The femoral head and neck 0 grossly intact. There is severe osteoarthritic joint space loss of the right hip. The sub trochanteric region and remainder of the shaft and distal femur exhibit no acute abnormalities. IMPRESSION: There is an acute intertrochanteric fracture of the right hip. No fracture is seen more distally. Electronically Signed   By: David  Martinique M.D.   On: 05/01/2018 13:55    EKG: Orders placed or performed during the hospital encounter of 05/01/18  . ED EKG  . ED EKG    IMPRESSION AND PLAN: Patient is 83 year old presenting after fall  1.  Right hip intertrochanteric fracture Preoperative evaluation patient has no cardiopulmonary symptoms He is at moderate risk of surgical complications but no further cardiopulmonary work-up needed  2.  History of bladder cancer Continue mirabegron and flomax and Ditropan  3.  Anemia likely  due to anemia of chronic disease  4.  CODE STATUS full discussed with the patient All the records are reviewed and case discussed with ED provider. Management plans discussed with the patient, family and they are in agreement.  CODE STATUS: Code Status History    Date Active Date Inactive Code Status Order ID Comments User Context   12/26/2017 2141 12/30/2017 1727 Full Code [id number]  Demetrios Loll, MD Inpatient   05/31/2017 0208 06/01/2017 1922 Full Code [id number]  Harvie Bridge, DO Inpatient   03/04/2017 1833 03/05/2017 1426 Full Code [id number]  Idelle Crouch, MD Inpatient   07/11/2016 0538 07/13/2016 1420 Full Code [id number]  Saundra Shelling, MD ED       TOTAL TIME TAKING CARE OF THIS PATIENT: 55 minutes.    Dustin Flock M.D on 05/01/2018 at 2:41 PM  Between 7am to 6pm - Pager - [phone]  After 6pm go to [email or web address] - password [email or web address] [facility]  [phone]  CC: Primary care physician; [name], MD

## 2018-05-01 NOTE — ED Notes (Signed)
MD at bedside with pt and with family. Pt given a warm blanket and is lying in bed. Bed locked and in lowest position. NAD noted.

## 2018-05-01 NOTE — ED Triage Notes (Signed)
Pt to ED from home reporting right sided pain. Skin tear to right arm and pain to right knee and right shoulder. No head injury and no LOC. Pt reports he loses his balance regularly but usually doe not injure himself. Pt is acting at baseline per family. Pt has arthritis in right leg as well.

## 2018-05-02 ENCOUNTER — Inpatient Hospital Stay: Payer: Medicare Other | Admitting: Anesthesiology

## 2018-05-02 ENCOUNTER — Encounter: Payer: Self-pay | Admitting: Anesthesiology

## 2018-05-02 ENCOUNTER — Encounter
Admission: EM | Disposition: A | Discharge status: Skilled Nursing Facility | Payer: Self-pay | Source: Home / Self Care | Attending: Internal Medicine

## 2018-05-02 ENCOUNTER — Inpatient Hospital Stay: Payer: Medicare Other

## 2018-05-02 HISTORY — PX: INTRAMEDULLARY (IM) NAIL INTERTROCHANTERIC: SHX5875

## 2018-05-02 LAB — IRON AND TIBC
IRON: 25 ug/dL — AB (ref 45–182)
Saturation Ratios: 11 % — ABNORMAL LOW (ref 17.9–39.5)
TIBC: 226 ug/dL — AB (ref 250–450)
UIBC: 201 ug/dL

## 2018-05-02 LAB — CBC
HEMATOCRIT: 23.5 % — AB (ref 40.0–52.0)
HEMOGLOBIN: 7.8 g/dL — AB (ref 13.0–18.0)
MCH: 31 pg (ref 26.0–34.0)
MCHC: 33.4 g/dL (ref 32.0–36.0)
MCV: 92.9 fL (ref 80.0–100.0)
Platelets: 43 10*3/uL — ABNORMAL LOW (ref 150–440)
RBC: 2.52 MIL/uL — ABNORMAL LOW (ref 4.40–5.90)
RDW: 18.7 % — ABNORMAL HIGH (ref 11.5–14.5)
WBC: 2.3 10*3/uL — AB (ref 3.8–10.6)

## 2018-05-02 LAB — BASIC METABOLIC PANEL
ANION GAP: 6 (ref 5–15)
BUN: 17 mg/dL (ref 8–23)
CHLORIDE: 107 mmol/L (ref 98–111)
CO2: 24 mmol/L (ref 22–32)
CREATININE: 0.89 mg/dL (ref 0.61–1.24)
Calcium: 8.3 mg/dL — ABNORMAL LOW (ref 8.9–10.3)
GFR calc non Af Amer: >60 mL/min (ref >60)
Glucose, Bld: 134 mg/dL — ABNORMAL HIGH (ref 70–99)
Potassium: 3.4 mmol/L — ABNORMAL LOW (ref 3.5–5.1)
SODIUM: 137 mmol/L (ref 135–145)

## 2018-05-02 LAB — RETICULOCYTES
RBC.: 2.49 MIL/uL — ABNORMAL LOW (ref 4.40–5.90)
Retic Count, Absolute: 7.5 10*3/uL — ABNORMAL LOW (ref 19.0–183.0)
Retic Ct Pct: 0.3 % — ABNORMAL LOW (ref 0.4–3.1)

## 2018-05-02 LAB — FOLATE: FOLATE: 22 ng/mL (ref >5.9)

## 2018-05-02 LAB — SURGICAL PCR SCREEN
MRSA, PCR: NEGATIVE
STAPHYLOCOCCUS AUREUS: NEGATIVE

## 2018-05-02 LAB — FERRITIN: Ferritin: 328 ng/mL (ref 24–336)

## 2018-05-02 LAB — VITAMIN B12: VITAMIN B 12: 73 pg/mL — AB (ref 180–914)

## 2018-05-02 SURGERY — FIXATION, FRACTURE, INTERTROCHANTERIC, WITH INTRAMEDULLARY ROD
Anesthesia: General | Site: Hip | Laterality: Right | Wound class: Clean

## 2018-05-02 MED ORDER — ROCURONIUM BROMIDE 100 MG/10ML IV SOLN
INTRAVENOUS | Status: DC | PRN
  Administered 2018-05-02 (×2): 30 mg via INTRAVENOUS

## 2018-05-02 MED ORDER — ACETAMINOPHEN 10 MG/ML IV SOLN
INTRAVENOUS | Status: AC
  Filled 2018-05-02: qty 100

## 2018-05-02 MED ORDER — POTASSIUM CHLORIDE 10 MEQ/100ML IV SOLN
10.0000 meq | INTRAVENOUS | Status: AC
  Administered 2018-05-02 (×4): 10 meq via INTRAVENOUS
  Filled 2018-05-02 (×4): qty 100

## 2018-05-02 MED ORDER — ONDANSETRON HCL 4 MG/2ML IJ SOLN
INTRAMUSCULAR | Status: DC | PRN
  Administered 2018-05-02: 4 mg via INTRAVENOUS

## 2018-05-02 MED ORDER — OXYCODONE HCL 5 MG PO TABS: 5.0000 mg | ORAL_TABLET | Freq: Once | ORAL | Status: DC | PRN

## 2018-05-02 MED ORDER — SUCCINYLCHOLINE CHLORIDE 20 MG/ML IJ SOLN
INTRAMUSCULAR | Status: DC | PRN
  Administered 2018-05-02: 160 mg via INTRAVENOUS

## 2018-05-02 MED ORDER — SODIUM CHLORIDE 0.9 % IV SOLN
INTRAVENOUS | Status: DC | PRN
  Administered 2018-05-02 (×2): via INTRAVENOUS

## 2018-05-02 MED ORDER — MUPIROCIN 2 % EX OINT
1.0000 "application " | TOPICAL_OINTMENT | Freq: Two times a day (BID) | CUTANEOUS | Status: DC
  Filled 2018-05-02: qty 22

## 2018-05-02 MED ORDER — FENTANYL CITRATE (PF) 100 MCG/2ML IJ SOLN
INTRAMUSCULAR | Status: DC | PRN
  Administered 2018-05-02: 50 ug via INTRAVENOUS
  Administered 2018-05-02 (×2): 25 ug via INTRAVENOUS

## 2018-05-02 MED ORDER — SUGAMMADEX SODIUM 200 MG/2ML IV SOLN
INTRAVENOUS | Status: AC
  Filled 2018-05-02: qty 2

## 2018-05-02 MED ORDER — PHENYLEPHRINE HCL 10 MG/ML IJ SOLN
INTRAMUSCULAR | Status: DC | PRN
  Administered 2018-05-02 (×2): 50 ug via INTRAVENOUS
  Administered 2018-05-02: 100 ug via INTRAVENOUS

## 2018-05-02 MED ORDER — FENTANYL CITRATE (PF) 100 MCG/2ML IJ SOLN: 25.0000 ug | INTRAMUSCULAR | Status: DC | PRN

## 2018-05-02 MED ORDER — PROPOFOL 10 MG/ML IV BOLUS
INTRAVENOUS | Status: DC | PRN
  Administered 2018-05-02: 120 mg via INTRAVENOUS

## 2018-05-02 MED ORDER — ONDANSETRON HCL 4 MG/2ML IJ SOLN
INTRAMUSCULAR | Status: AC
  Filled 2018-05-02: qty 2

## 2018-05-02 MED ORDER — SODIUM CHLORIDE 0.9 % IV SOLN
INTRAVENOUS | Status: DC | PRN
  Administered 2018-05-02: 25 ug/min via INTRAVENOUS

## 2018-05-02 MED ORDER — NEOMYCIN-POLYMYXIN B GU 40-200000 IR SOLN
Status: DC | PRN
  Administered 2018-05-02: 4 mL

## 2018-05-02 MED ORDER — FENTANYL CITRATE (PF) 100 MCG/2ML IJ SOLN
INTRAMUSCULAR | Status: AC
  Filled 2018-05-02: qty 2

## 2018-05-02 MED ORDER — SODIUM CHLORIDE 0.9% IV SOLUTION: Freq: Once | INTRAVENOUS | Status: DC

## 2018-05-02 MED ORDER — ACETAMINOPHEN 10 MG/ML IV SOLN
INTRAVENOUS | Status: DC | PRN
  Administered 2018-05-02: 1000 mg via INTRAVENOUS

## 2018-05-02 MED ORDER — OXYCODONE HCL 5 MG/5ML PO SOLN: 5.0000 mg | Freq: Once | ORAL | Status: DC | PRN

## 2018-05-02 MED ORDER — SODIUM CHLORIDE 0.9 % IV SOLN
INTRAVENOUS | Status: DC | PRN
  Administered 2018-05-02: 22:00:00 via INTRAVENOUS

## 2018-05-02 MED ORDER — SUGAMMADEX SODIUM 200 MG/2ML IV SOLN
INTRAVENOUS | Status: DC | PRN
  Administered 2018-05-02: 170 mg via INTRAVENOUS

## 2018-05-02 MED ORDER — LIDOCAINE HCL (CARDIAC) PF 100 MG/5ML IV SOSY
PREFILLED_SYRINGE | INTRAVENOUS | Status: DC | PRN
  Administered 2018-05-02: 100 mg via INTRAVENOUS

## 2018-05-02 SURGICAL SUPPLY — 36 items
BIT DRILL 4.0X195MM (BIT) ×1 IMPLANT
BLADE HELICAL 110 (Orthopedic Implant) ×2 IMPLANT
BLADE HELICAL 110MM (Orthopedic Implant) ×1 IMPLANT
CANISTER SUCT 1200ML W/VALVE (MISCELLANEOUS) ×3 IMPLANT
DRAPE C-ARMOR (DRAPES) ×3 IMPLANT
DRAPE SHEET LG 3/4 BI-LAMINATE (DRAPES) ×3 IMPLANT
DRILL BIT 4.0X195MM (BIT) ×2
DRSG DERMACEA 8X12 NADH (GAUZE/BANDAGES/DRESSINGS) IMPLANT
DRSG OPSITE POSTOP 3X4 (GAUZE/BANDAGES/DRESSINGS) ×3 IMPLANT
DRSG OPSITE POSTOP 4X12 (GAUZE/BANDAGES/DRESSINGS) IMPLANT
DURAPREP 26ML APPLICATOR (WOUND CARE) ×3 IMPLANT
ELECT REM PT RETURN 9FT ADLT (ELECTROSURGICAL) ×3
ELECTRODE REM PT RTRN 9FT ADLT (ELECTROSURGICAL) ×1 IMPLANT
GAUZE SPONGE 4X4 12PLY STRL (GAUZE/BANDAGES/DRESSINGS) IMPLANT
GLOVE BIOGEL M STRL SZ7.5 (GLOVE) ×6 IMPLANT
GLOVE INDICATOR 8.0 STRL GRN (GLOVE) ×6 IMPLANT
GOWN STRL REUS W/ TWL LRG LVL3 (GOWN DISPOSABLE) ×2 IMPLANT
GOWN STRL REUS W/TWL LRG LVL3 (GOWN DISPOSABLE) ×4
INACTIVE NO USAGE (Bolt) ×3 IMPLANT
KIT TURNOVER CYSTO (KITS) ×3 IMPLANT
MAT BLUE FLOOR 46X72 FLO (MISCELLANEOUS) ×3 IMPLANT
NAIL TROCH 11X130X400MM (Nail) ×3 IMPLANT
NAIL TROCHANTERIC 11MM 130D (Nail) ×3 IMPLANT
NS IRRIG 1000ML POUR BTL (IV SOLUTION) ×3 IMPLANT
PACK HIP COMPR (MISCELLANEOUS) ×3 IMPLANT
REAMER ROD DEEP FLUTE 2.5X950 (INSTRUMENTS) ×3 IMPLANT
SOL PREP PVP 2OZ (MISCELLANEOUS) ×3
SOLUTION PREP PVP 2OZ (MISCELLANEOUS) ×1 IMPLANT
STAPLER SKIN PROX 35W (STAPLE) ×3 IMPLANT
SUCTION FRAZIER HANDLE 10FR (MISCELLANEOUS) ×2
SUCTION TUBE FRAZIER 10FR DISP (MISCELLANEOUS) ×1 IMPLANT
SUT VIC AB 0 CT1 36 (SUTURE) ×3 IMPLANT
SUT VIC AB 1 CT1 36 (SUTURE) ×3 IMPLANT
SUT VIC AB 2-0 CT1 27 (SUTURE) ×2
SUT VIC AB 2-0 CT1 TAPERPNT 27 (SUTURE) ×1 IMPLANT
TAPE MICROFOAM 4IN (TAPE) IMPLANT

## 2018-05-02 NOTE — Anesthesia Post-op Follow-up Note (Signed)
Anesthesia QCDR form completed.        

## 2018-05-02 NOTE — NC FL2 (Signed)
[place] LEVEL OF CARE SCREENING TOOL     IDENTIFICATION  Patient Name: Vincent Poole Birthdate: 28-Sep-1935 Sex: male Admission Date (Current Location): 05/01/2018  [place] and [place] Number:  Engineering geologist and Address:  [facility], [address] [place], [place] [address]      Provider Number: [id number]  Attending Physician Name and Address:  Bettey Costa, MD  Relative Name and Phone Number:       Current Level of Care: Hospital Recommended Level of Care: [place] Prior Approval Number:    Date Approved/Denied:   PASRR Number: [id number])  Discharge Plan: SNF    Current Diagnoses: Patient Active Problem List   Diagnosis Date Noted  . Hip fracture ([place]) 05/01/2018  . Malignant neoplasm of urinary bladder ([place]) 01/12/2018  . Goals of care, counseling/discussion 01/12/2018  . Pyelonephritis 05/31/2017  . UTI (urinary tract infection) 03/04/2017  . Urinary obstruction   . Essential hypertension 08/30/2016  . History of shingles 08/30/2016  . Prostate cancer ([place]) 08/30/2016  . Urinary retention 08/30/2016  . Medicare annual wellness visit, initial 08/01/2016  . Medicare annual wellness visit, subsequent 08/01/2016  . Sepsis ([place]) 07/11/2016  . Borderline diabetes mellitus 01/26/2016  . Vaccine counseling 01/26/2015  . Lumbar radiculitis 06/24/2014  . OA (osteoarthritis) of hip 06/24/2014  . Malignant neoplasm of prostate ([place]) 01/27/2011    Orientation RESPIRATION BLADDER Height & Weight     Self, Time, Situation, Place  Normal Continent Weight: 187 lb (84.8 kg) Height:  5\' 11"  (180.3 cm)  BEHAVIORAL SYMPTOMS/MOOD NEUROLOGICAL BOWEL NUTRITION STATUS      Continent Diet(Diet: NPO for surgery to be advanced. )  AMBULATORY STATUS COMMUNICATION OF NEEDS Skin   Extensive Assist Verbally Surgical wounds                       Personal Care Assistance Level of Assistance  Bathing,  Feeding, Dressing Bathing Assistance: Limited assistance Feeding assistance: Independent Dressing Assistance: Limited assistance     Functional Limitations Info  Sight, Hearing, Speech Sight Info: Adequate Hearing Info: Impaired Speech Info: Adequate    SPECIAL CARE FACTORS FREQUENCY  PT (By licensed PT), OT (By licensed OT)     PT Frequency: (5) OT Frequency: (5)            Contractures      Additional Factors Info  Code Status, Allergies Code Status Info: (Full Code. ) Allergies Info: (Penicillins. )           Current Medications (05/02/2018):  This is the current hospital active medication list Current Facility-Administered Medications  Medication Dose Route Frequency Provider Last Rate Last Dose  . 0.9 %  sodium chloride infusion (Manually program via Guardrails IV Fluids)   Intravenous Once Hooten, Laurice Record, MD      . 0.9 %  sodium chloride infusion   Intravenous Continuous Dustin Flock, MD 75 mL/hr at 05/02/18 0533    . acetaminophen (TYLENOL) tablet 650 mg  650 mg Oral Q6H PRN Dustin Flock, MD       Or  . acetaminophen (TYLENOL) suppository 650 mg  650 mg Rectal Q6H PRN Dustin Flock, MD      . ceFAZolin (ANCEF) IVPB 2g/100 mL premix  2 g Intravenous To OR Hooten, Laurice Record, MD      . finasteride (PROSCAR) tablet 5 mg  5 mg Oral Daily Dustin Flock, MD      . HYDROcodone-acetaminophen (NORCO/VICODIN) [phone]  MG per tablet 1-2 tablet  1-2 tablet Oral Q4H PRN Dustin Flock, MD   2 tablet at 05/02/18 [phone]  . HYDROmorphone (DILAUDID) injection 2 mg  2 mg Intravenous Q3H PRN Dustin Flock, MD   2 mg at 05/01/18 1446  . loratadine (CLARITIN) tablet 10 mg  10 mg Oral Daily Dustin Flock, MD      . mirabegron ER [facility]) tablet 50 mg  50 mg Oral Daily Dustin Flock, MD      . ondansetron [facility]) tablet 4 mg  4 mg Oral Q6H PRN Dustin Flock, MD       Or  . ondansetron [facility]) injection 4 mg  4 mg Intravenous Q6H PRN Dustin Flock, MD   4 mg at  05/02/18 1629  . tamsulosin (FLOMAX) capsule 0.4 mg  0.4 mg Oral BID Dustin Flock, MD   0.4 mg at 05/01/18 2152     Discharge Medications: Please see discharge summary for a list of discharge medications.  Relevant Imaging Results:  Relevant Lab Results:   Additional Information (SSN: [id number])  Mycah Mcdougall, Veronia Beets, LCSW

## 2018-05-02 NOTE — Anesthesia Procedure Notes (Signed)
Procedure Name: Intubation Date/Time: 05/02/2018 8:23 PM Performed by: Lendon Colonel, CRNA Pre-anesthesia Checklist: Patient identified, Patient being monitored, Timeout performed, Emergency Drugs available and Suction available Patient Re-evaluated:Patient Re-evaluated prior to induction Oxygen Delivery Method: Circle system utilized Preoxygenation: Pre-oxygenation with 100% oxygen Induction Type: IV induction Laryngoscope Size: Miller and 2 Grade View: Grade I Tube type: Oral Tube size: 7.0 mm Number of attempts: 1 Airway Equipment and Method: Stylet Placement Confirmation: ETT inserted through vocal cords under direct vision,  positive ETCO2 and breath sounds checked- equal and bilateral Secured at: 23 cm Tube secured with: Tape Dental Injury: Teeth and Oropharynx as per pre-operative assessment

## 2018-05-02 NOTE — Op Note (Signed)
OPERATIVE NOTE  DATE OF SURGERY:  05/02/2018  PATIENT NAME:  Vincent Poole   DOB: [birth year]  MRN: [id number]  PRE-OPERATIVE DIAGNOSIS: Right intertrochanteric femur fracture  POST-OPERATIVE DIAGNOSIS:  Same  PROCEDURE: Open reduction and internal fixation of a right intertrochanteric femur fracture   SURGEON:  Marciano Sequin. M.D.  ANESTHESIA: general  ESTIMATED BLOOD LOSS: 350 mL  FLUIDS REPLACED: 1450 mL of crystalloid, 1 unit of platelets  DRAINS: None  IMPLANTS UTILIZED: Synthes 11 mm x 380 mm/130 trochanteric fixation nail, 737 mm helical blade, 56 mm x 5.0 mm locking screw  INDICATIONS FOR SURGERY: Vincent Poole is a 83 y.o. year old male who fell and sustained a displaced right intertrochanteric femur fracture. After discussion of the risks and benefits of surgical intervention, the patient expressed understanding of the risks benefits and agree with plans for open reduction and internal fixation.   The risks, benefits, and alternatives were discussed at length including but not limited to the risks of infection, bleeding, nerve injury, stiffness, blood clots, the need for revision surgery, limb length inequality, cardiopulmonary complications, among others, and they were willing to proceed.  PROCEDURE IN DETAIL: The patient was brought into the operating room and, after adequate general anesthesia was achieved, patient was placed on the fracture table. All bony prominences were well-padded. The right lower extremity was placed in traction and a provisional reduction was performed and verified using the C-arm. The patient's right hip and leg were cleaned and prepped with alcohol and DuraPrep and draped in the usual sterile fashion. A "timeout" was performed as per usual protocol. A lateral incision was made extended from the proximal portion of the greater trochanter proximally. The fascia was incised in line with the skin incision and the fibers of the hip abductors  were split in line. The tip of the greater trochanter was palpated and a distally threaded guide pin was inserted into the tip of the greater trochanter and advanced into the medullary canal. Position was confirmed in both AP and lateral planes using the C-arm. A pilot hole was enlarged using a step drill.  The initial guidepin was replaced with a long distally beaded guidepin for reaming of the femoral canal.  The femoral canal was reamed in a sequential fashion up to a 12 mm diameter.  Measurements were obtained and it was felt that a 380 mm long nail was appropriate.  A Synthes 11 mm x 380 mm/130 trochanteric fixation nail was advanced over the guidepin and position confirmed using the C-arm. A second stab incision was made and the tissue protector was inserted through the outrigger device and advanced to the lateral cortex of the femur. A threaded screw guide pin was inserted into the femoral neck and head and position was again confirmed in both AP and lateral planes. Vision was were obtained and it was felt that a 106 mm helical blade was appropriate. The cortex was reamed and then a cannulated reamer was advanced over the guidepin to the appropriate depth. A 269 mm helical blade was then advanced over the guidepin and impacted into place. Good position was noted in multiple planes using the C-arm. The locking sleeve was engaged. Finally, a third stab incision was made under x-ray guidance and the slotted hole was drilled for distal locking of the femur. A 56 mm x 5.0 mm locking screw was then inserted. The outrigger device was removed. The hip was visualized in all planes using the C-arm with good reduction  appreciated and good position of the hardware noted.  The wound was irrigated with copious amounts of normal saline with antibiotic solution and suctioned dry. Good hemostasis was appreciated. The fascia was reapproximated using interrupted sutures of #1 Vicryl. Subcutaneous tissue was approximated  layers using first #0 Vicryl followed #2-0 Vicryl. The skin was closed with skin staples. A sterile dressing was applied.  The patient tolerated the procedure well and was transported to the recovery room in stable condition.   Marciano Sequin., M.D.

## 2018-05-02 NOTE — Anesthesia Preprocedure Evaluation (Deleted)
Anesthesia Evaluation  Patient identified by MRN, date of birth, ID band Patient awake    Reviewed: Allergy & Precautions, H&P , NPO status , Patient's Chart, lab work & pertinent test results  History of Anesthesia Complications Negative for: history of anesthetic complications  Airway Mallampati: III  TM Distance: >3 FB Neck ROM: limited    Dental  (+) Chipped, Poor Dentition, Missing, Edentulous Upper   Pulmonary neg pulmonary ROS, neg shortness of breath,           Cardiovascular Exercise Tolerance: Good hypertension, (-) angina(-) Past MI and (-) DOE      Neuro/Psych Anxiety  Neuromuscular disease negative psych ROS   GI/Hepatic negative GI ROS, Neg liver ROS, neg GERD  ,  Endo/Other  negative endocrine ROS  Renal/GU      Musculoskeletal  (+) Arthritis ,   Abdominal   Peds  Hematology negative hematology ROS (+)   Anesthesia Other Findings Past Medical History: No date: Anxiety No date: Arthritis No date: Bladder cancer ([place]) No date: Hypertension No date: Prostate cancer ([place]) 2019: Urinary retention     Comment:  foley catheter place 11/2017 2019: UTI (urinary tract infection)     Comment:  frequent UTI's over last year  Past Surgical History: No date: CARPAL TUNNEL RELEASE; Right 2004: CHOLECYSTECTOMY 12/27/2017: CYSTOSCOPY W/ URETERAL STENT PLACEMENT; Bilateral     Comment:  Procedure: CYSTOSCOPY WITH RETROGRADE PYELOGRAM/URETERAL              STENT PLACEMENT;  Surgeon: [name], MD;                Location: [facility] ORS;  Service: Urology;  Laterality:               Bilateral; 1958: LEG TENDON SURGERY; Right 12/27/2017: TRANSURETHRAL RESECTION OF BLADDER TUMOR; N/A     Comment:  Procedure: TRANSURETHRAL RESECTION OF BLADDER TUMOR               (TURBT);  Surgeon: [name], MD;  Location: [facility]               ORS;  Service: Urology;  Laterality: N/A;      Reproductive/Obstetrics negative OB ROS                             Anesthesia Physical  Anesthesia Plan  ASA: III  Anesthesia Plan: General ETT   Post-op Pain Management:    Induction: Intravenous  PONV Risk Score and Plan: Ondansetron, Dexamethasone and Treatment may vary due to age or medical condition  Airway Management Planned: Oral ETT  Additional Equipment:   Intra-op Plan:   Post-operative Plan: Extubation in OR  Informed Consent: I have reviewed the patients History and Physical, chart, labs and discussed the procedure including the risks, benefits and alternatives for the proposed anesthesia with the patient or authorized representative who has indicated his/her understanding and acceptance.   Dental Advisory Given  Plan Discussed with: Anesthesiologist, CRNA and Surgeon  Anesthesia Plan Comments: (Patient consented for risks of anesthesia including but not limited to:  - adverse reactions to medications - damage to teeth, lips or other oral mucosa - sore throat or hoarseness - Damage to heart, brain, lungs or loss of life  Patient voiced understanding.)        Anesthesia Quick Evaluation

## 2018-05-02 NOTE — Anesthesia Preprocedure Evaluation (Signed)
Anesthesia Evaluation  Patient identified by MRN, date of birth, ID band Patient awake    Reviewed: Allergy & Precautions, H&P , NPO status , Patient's Chart, lab work & pertinent test results  History of Anesthesia Complications Negative for: history of anesthetic complications  Airway Mallampati: III  TM Distance: >3 FB Neck ROM: limited    Dental  (+) Chipped, Poor Dentition, Missing   Pulmonary neg pulmonary ROS, neg shortness of breath,           Cardiovascular Exercise Tolerance: Good hypertension,      Neuro/Psych Anxiety  Neuromuscular disease negative psych ROS   GI/Hepatic negative GI ROS, Neg liver ROS, neg GERD  ,  Endo/Other  negative endocrine ROS  Renal/GU      Musculoskeletal  (+) Arthritis ,   Abdominal   Peds  Hematology negative hematology ROS (+)   Anesthesia Other Findings Past Medical History: No date: Anxiety No date: Arthritis No date: Bladder cancer ([place]) No date: Hypertension No date: Prostate cancer ([place]) 2019: Urinary retention     Comment:  foley catheter place 11/2017 2019: UTI (urinary tract infection)     Comment:  frequent UTI's over last year  Past Surgical History: No date: CARPAL TUNNEL RELEASE; Right 2004: CHOLECYSTECTOMY 12/27/2017: CYSTOSCOPY W/ URETERAL STENT PLACEMENT; Bilateral     Comment:  Procedure: CYSTOSCOPY WITH RETROGRADE PYELOGRAM/URETERAL              STENT PLACEMENT;  Surgeon: [name], MD;                Location: [facility] ORS;  Service: Urology;  Laterality:               Bilateral; 1958: LEG TENDON SURGERY; Right 01/22/2018: PORTA CATH INSERTION; N/A     Comment:  Procedure: PORTA CATH INSERTION;  Surgeon: [name],              MD;  Location: [facility];  Service:               Cardiovascular;  Laterality: N/A; 12/27/2017: TRANSURETHRAL RESECTION OF BLADDER TUMOR; N/A     Comment:  Procedure: TRANSURETHRAL RESECTION OF BLADDER TUMOR                (TURBT);  Surgeon: [name], MD;  Location: [facility]               ORS;  Service: Urology;  Laterality: N/A; 01/15/2018: TRANSURETHRAL RESECTION OF BLADDER TUMOR; N/A     Comment:  Procedure: TRANSURETHRAL RESECTION OF BLADDER TUMOR               (TURBT);  Surgeon: [name], MD;  Location: [facility]               ORS;  Service: Urology;  Laterality: N/A;  Need 2 hrs for              this case please  BMI    Body Mass Index:  26.08 kg/m      Reproductive/Obstetrics negative OB ROS                             Anesthesia Physical Anesthesia Plan  ASA: III  Anesthesia Plan: General ETT   Post-op Pain Management:    Induction: Intravenous  PONV Risk Score and Plan: Ondansetron and Dexamethasone  Airway Management Planned: Oral ETT  Additional Equipment:   Intra-op Plan:   Post-operative  Plan: Extubation in OR  Informed Consent: I have reviewed the patients History and Physical, chart, labs and discussed the procedure including the risks, benefits and alternatives for the proposed anesthesia with the patient or authorized representative who has indicated his/her understanding and acceptance.   Dental Advisory Given  Plan Discussed with: Anesthesiologist, CRNA and Surgeon  Anesthesia Plan Comments: (PLTs 40K   Patient consented for risks of anesthesia including but not limited to:  - adverse reactions to medications - damage to teeth, lips or other oral mucosa - sore throat or hoarseness - Damage to heart, brain, lungs or loss of life  Patient voiced understanding.)        Anesthesia Quick Evaluation

## 2018-05-02 NOTE — Progress Notes (Signed)
This nurse was notified by MD Hooten of pt's low platelet level. MD Mody paged to Hooten's cell phone per Hootens request.

## 2018-05-02 NOTE — Progress Notes (Addendum)
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   Respiratory: Negative for cough, shortness of breath, wheezing  Cardiovascular: Negative for chest pain, palpitations and leg swelling.  Gastrointestinal: Negative for heartburn, abdominal pain, vomiting, diarrhea or consitpation Genitourinary: Negative for dysuria, urgency, frequency, hematuria Musculoskeletal: Negative for back pain ++ joint pain Neurological: Negative for dizziness, seizures, syncope, focal weakness,  numbness and headaches.  Hematological: Does not bruise/bleed easily.  Psychiatric/Behavioral: Negative for hallucinations, confusion, dysphoric mood    Tolerating Diet: NPO      DRUG ALLERGIES:   Allergies  Allergen Reactions  . Penicillins Other (See Comments)    Caused nervousness as a child. Patient states he took as a teenager w/o problems Has patient had a PCN reaction causing immediate rash, facial/tongue/throat swelling, SOB or lightheadedness with hypotension: No Has patient had a PCN reaction causing severe rash involving mucus membranes or skin necrosis: No Has patient had a PCN reaction that required hospitalization: No Has patient had a PCN reaction occurring within the last 10 years: No If all of the above answers are "NO", then may proceed with    VITALS:  Blood pressure 131/64, pulse 79, temperature 98.6 F (37 C), temperature source Oral, resp. rate 19, height 5\' 11"  (1.803 m), weight 84.8 kg (187 lb), SpO2 96 %.  PHYSICAL EXAMINATION:  Constitutional: Appears well-developed and well-nourished. No distress. HENT:  Normocephalic. Marland Kitchen Oropharynx is clear and moist.  Eyes: Conjunctivae and EOM are normal. PERRLA, no scleral icterus.  Neck: Normal ROM. Neck supple. No JVD. No tracheal deviation. CVS: RRR, S1/S2 +, no murmurs, no gallops, no carotid bruit.  Pulmonary: Effort and breath sounds normal, no stridor, rhonchi, wheezes, rales.  Abdominal: Soft. BS +,  no distension, tenderness, rebound or guarding.  Musculoskeletal: right leg shorter. No edema and no tenderness. +tremor Neuro: Alert. CN 2-12 grossly intact. No focal deficits. Skin: Skin is warm and dry. No rash noted. Psychiatric: Normal mood and affect.      LABORATORY PANEL:   CBC Recent Labs  Lab 05/02/18 0636  WBC 2.3*  HGB 7.8*  HCT 23.5*  PLT 43*   ------------------------------------------------------------------------------------------------------------------  Chemistries  Recent Labs  Lab 04/26/18 1102 05/02/18 0636  NA 140 137  K 3.6 3.4*  CL 109 107  CO2 22 24  GLUCOSE 122* 134*  BUN 13 17  CREATININE 0.86 0.89  CALCIUM 8.8* 8.3*  AST 15  --   ALT 10*  --   ALKPHOS 58  --   BILITOT 0.2*  --    ------------------------------------------------------------------------------------------------------------------  Cardiac Enzymes No results for input(s): TROPONINI in the last 168 hours. ------------------------------------------------------------------------------------------------------------------  RADIOLOGY:  Dg Chest 1 View  Result Date: 05/01/2018 CLINICAL DATA:  Preoperative chest radiograph prior to RIGHT femur surgery. EXAM: CHEST  1 VIEW COMPARISON:  04/04/2018 and prior studies FINDINGS: Cardiomegaly noted. A RIGHT IJ Port-A-Cath is noted with tip overlying the mid SVC. There is no evidence of focal airspace disease, pulmonary edema, suspicious pulmonary nodule/mass, pleural effusion, or pneumothorax. No acute bony abnormalities are identified. Degenerative changes in the shoulders noted. IMPRESSION:  Cardiomegaly without evidence of active  cardiopulmonary disease. Electronically Signed   By: Margarette Canada M.D.   On: 05/01/2018 14:25   Dg Pelvis 1-2 Views  Result Date: 05/01/2018 CLINICAL DATA:  Status post fall at home today. Patient is complaining of hip and thigh pain and is unable to internally rotate the leg. EXAM: PELVIS - 1-2 VIEW COMPARISON:  Right femur series of today's date FINDINGS: The bony pelvis is subjectively osteopenic. There is severe joint space loss of the right hip with irregularity of the femoral head. There is abnormal lucency in the intertrochanteric region consistent with an acute fracture. There are degenerative changes of the left hip. Double pigtail ureteral stents are present. IMPRESSION: Acute intertrochanteric fracture of the right hip. No definite acute pelvic fracture. 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 Electronically Signed   By: David  Martinique M.D.   On: 05/01/2018 13:54   Dg Knee Complete 4 Views Left  Result Date: 05/01/2018 CLINICAL DATA:  Left knee pain following a fall at home today. EXAM: LEFT KNEE - COMPLETE 4+ VIEW COMPARISON:  None. FINDINGS: The bones are subjectively mildly osteopenic. There is mild narrowing of the medial joint compartment. There is no acute tibial plateau fracture nor other acute fracture. There is mild beaking of the tibial spines. There is spurring of the superior and inferior articular margins of the patella. The visualized portions of the fibula are intact. There is no joint effusion. IMPRESSION: Mild degenerative change of the left knee. No acute fracture or dislocation. Electronically Signed   By: David  Martinique M.D.   On: 05/01/2018 13:57   Dg Femur Min 2 Views Right  Result Date: 05/01/2018 CLINICAL DATA:  The patient has sustained a fall at home today and is complaining of right hip and thigh pain and is unable to internally rotate the leg. EXAM: RIGHT FEMUR 2 VIEWS COMPARISON:  AP pelvis of  today's date FINDINGS: There is an acute intertrochanteric fracture of the right femur. The femoral head and neck 0 grossly intact. There is severe osteoarthritic joint space loss of the right hip. The sub trochanteric region and remainder of the shaft and distal femur exhibit no acute abnormalities. IMPRESSION: There is an acute intertrochanteric fracture of the right hip. No fracture is seen more distally. Electronically Signed   By: David  Martinique M.D.   On: 05/01/2018 13:55     ASSESSMENT AND PLAN:    83 year old male with history of bladder cancer status post chemotherapy who presented after mechanical fall and has suffered a right hip intertrochanteric fracture.  1.  Right intertrochanteric femur fracture: Plan for ORIF today. DVT prophylaxis as per orthopedic surgery. Continue pain management  2.  History of bladder cancer with chronic Foley catheter that was replaced 2 weeks ago. Patient will follow-up with Dr. Erlene Quan in 2 weeks.  3.  BPH: Continue Proscar and Flomax  4. Anemia acute on chronic from chronic disease, recent chemo:Likely more consumptive from fracture Anemia panel ordered Follow [facility] Transfuse ih HGB<7  D/w Dr [name]  5. Hypokalemia: IV replacement today BMP for am   6. Thrombocytopenia: likely from chemo ONCOLOGY consult and d/w Dr [name]. 1 unit of PLT right before surgery is recommended.   Ok to go to surgery if ok with anesthiology  Management plans discussed with the patient and wife and they are in agreement.  CODE STATUS: FULL  TOTAL TIME TAKING CARE OF THIS PATIENT: 30 minutes.     POSSIBLE D/C 2 days, DEPENDING ON  CLINICAL CONDITION.   Aletha Allebach M.D on 05/02/2018 at 11:09 AM  Between 7am to 6pm - Pager - [phone] After 6pm go to [email or web address] - password [email or web address] [facility]  Office  [phone]  CC: Primary care physician; [name], MD  Note: This dictation was prepared with Dragon dictation along  with smaller phrase technology. Any transcriptional errors that result from this process are unintentional.

## 2018-05-02 NOTE — Transfer of Care (Signed)
Immediate Anesthesia Transfer of Care Note  Patient: Vincent Poole  Procedure(s) Performed: INTRAMEDULLARY (IM) NAIL INTERTROCHANTRIC (Right Hip)  Patient Location: PACU  Anesthesia Type:General  Level of Consciousness: awake, alert  and pateint uncooperative  Airway & Oxygen Therapy: Patient Spontanous Breathing and Patient connected to nasal cannula oxygen  Post-op Assessment: Report given to RN and Post -op Vital signs reviewed and stable  Post vital signs: Reviewed and stable  Last Vitals:  Vitals Value Taken Time  BP 116/59 05/02/2018 10:42 PM  Temp    Pulse 115 05/02/2018 10:44 PM  Resp 27 05/02/2018 10:44 PM  SpO2 87 % 05/02/2018 10:44 PM  Vitals shown include unvalidated device data.  Last Pain:  Vitals:   05/02/18 1900  TempSrc: Tympanic  PainSc:       Patients Stated Pain Goal: 1 (63/81/77 1165)  Complications: No apparent anesthesia complications

## 2018-05-02 NOTE — Progress Notes (Signed)
Pt off unit to OR

## 2018-05-02 NOTE — Clinical Social Work Note (Signed)
Clinical Social Work Assessment  Patient Details  Name: Vincent Poole MRN: [id number] Date of Birth: 09/30/1935  Date of referral:  05/02/18               Reason for consult:  Facility Placement                Permission sought to share information with:  Facility Contact Representative Permission granted to share information::  Yes, Verbal Permission Granted  Name::      Skilled Nursing Facility   Agency::   [place] [facility]   Relationship::     Contact Information:     Housing/Transportation Living arrangements for the past 2 months:  Single Family Home Source of Information:  Patient Patient Interpreter Needed:  None Criminal Activity/Legal Involvement Pertinent to Current Situation/Hospitalization:  No - Comment as needed Significant Relationships:  Adult Children, Spouse Lives with:  Spouse Do you feel safe going back to the place where you live?  Yes Need for family participation in patient care:  Yes (Comment)  Care giving concerns:  Patient lives in [place] with his wife Vincent Poole.    Social Worker assessment / plan:  Clinical Social Worker (CSW) reviewed chart and noted that patient has a hip fracture. CSW met with patient prior to surgery today. Patient was alert and oriented X4 and was laying in the bed. CSW introduced self and explained role of CSW department. Patient reported that he lives in [place] with his wife Vincent Poole. CSW explained that PT will evaluate patient after surgery and make a recommendation of home health or SNF. Patient is agreeable to whatever PT recommends. He is agreeable to SNF search in [place]. CSW explained that [employer] will have to approve SNF. Patient verbalized his understanding. FL2 complete and faxed out. CSW will continue to follow and assist as needed.   Employment status:  Retired Insurance information:  Managed Medicare PT Recommendations:  Not assessed at this time Information / Referral to community resources:  Skilled Nursing  Facility  Patient/Family's Response to care:  Patient is agreeable to SNF search in [place].   Patient/Family's Understanding of and Emotional Response to Diagnosis, Current Treatment, and Prognosis:  Patient was very pleasant and thanked CSW for assistance.   Emotional Assessment Appearance:  Appears stated age Attitude/Demeanor/Rapport:    Affect (typically observed):  Accepting, Adaptable, Pleasant Orientation:  Oriented to Self, Oriented to Place, Oriented to  Time, Oriented to Situation Alcohol / Substance use:  Not Applicable Psych involvement (Current and /or in the community):  No (Comment)  Discharge Needs  Concerns to be addressed:  Discharge Planning Concerns Readmission within the last 30 days:  No Current discharge risk:  Dependent with Mobility Barriers to Discharge:  Continued Medical Work up   Sample, Vincent M, LCSW 05/02/2018, 5:34 PM  

## 2018-05-02 NOTE — Clinical Social Work Placement (Signed)
   CLINICAL SOCIAL WORK PLACEMENT  NOTE  Date:  05/02/2018  Patient Details  Name: Vincent Poole MRN: [id number] Date of Birth: [birth year]  Clinical Social Work is seeking post-discharge placement for this patient at the [place] level of care (*CSW will initial, date and re-position this form in  chart as items are completed):  Yes   Patient/family provided with [facility] Work Department's list of facilities offering this level of care within the geographic area requested by the patient (or if unable, by the patient's family).  Yes   Patient/family informed of their freedom to choose among providers that offer the needed level of care, that participate in Medicare, Medicaid or managed care program needed by the patient, have an available bed and are willing to accept the patient.  Yes   Patient/family informed of [facility]'s ownership interest in [facility] and [facility], as well as of the fact that they are under no obligation to receive care at these facilities.  PASRR submitted to EDS on 05/02/18     PASRR number received on 05/02/18     Existing PASRR number confirmed on       FL2 transmitted to all facilities in geographic area requested by pt/family on 05/02/18     FL2 transmitted to all facilities within larger geographic area on       Patient informed that his/her managed care company has contracts with or will negotiate with certain facilities, including the following:            Patient/family informed of bed offers received.  Patient chooses bed at       Physician recommends and patient chooses bed at      Patient to be transferred to   on  .  Patient to be transferred to facility by       Patient family notified on   of transfer.  Name of family member notified:        PHYSICIAN       Additional Comment:    _______________________________________________ [place] Blizzard, Veronia Beets, LCSW 05/02/2018, 5:24 PM

## 2018-05-03 DIAGNOSIS — D630 Anemia in neoplastic disease: Secondary | ICD-10-CM

## 2018-05-03 DIAGNOSIS — Z880 Allergy status to penicillin: Secondary | ICD-10-CM

## 2018-05-03 DIAGNOSIS — Z809 Family history of malignant neoplasm, unspecified: Secondary | ICD-10-CM

## 2018-05-03 DIAGNOSIS — W19XXXA Unspecified fall, initial encounter: Secondary | ICD-10-CM

## 2018-05-03 DIAGNOSIS — C679 Malignant neoplasm of bladder, unspecified: Secondary | ICD-10-CM

## 2018-05-03 DIAGNOSIS — M84551A Pathological fracture in neoplastic disease, right femur, initial encounter for fracture: Secondary | ICD-10-CM

## 2018-05-03 DIAGNOSIS — Z9889 Other specified postprocedural states: Secondary | ICD-10-CM

## 2018-05-03 DIAGNOSIS — D696 Thrombocytopenia, unspecified: Secondary | ICD-10-CM

## 2018-05-03 LAB — ABO/RH: ABO/RH(D): A NEG

## 2018-05-03 LAB — HEMOGLOBIN AND HEMATOCRIT, BLOOD
HCT: 23.2 % — ABNORMAL LOW (ref 40.0–52.0)
HEMOGLOBIN: 7.7 g/dL — AB (ref 13.0–18.0)

## 2018-05-03 LAB — DIFFERENTIAL
Basophils Absolute: 0 10*3/uL (ref 0–0.1)
Basophils Relative: 0 %
EOS PCT: 1 %
Eosinophils Absolute: 0 10*3/uL (ref 0–0.7)
LYMPHS PCT: 16 %
Lymphs Abs: 0.5 10*3/uL — ABNORMAL LOW (ref 1.0–3.6)
MONO ABS: 0.3 10*3/uL (ref 0.2–1.0)
MONOS PCT: 9 %
Neutro Abs: 2.5 10*3/uL (ref 1.4–6.5)
Neutrophils Relative %: 74 %

## 2018-05-03 LAB — BASIC METABOLIC PANEL
ANION GAP: 8 (ref 5–15)
BUN: 14 mg/dL (ref 8–23)
CHLORIDE: 108 mmol/L (ref 98–111)
CO2: 22 mmol/L (ref 22–32)
Calcium: 8.2 mg/dL — ABNORMAL LOW (ref 8.9–10.3)
Creatinine, Ser: 0.83 mg/dL (ref 0.61–1.24)
GFR calc non Af Amer: >60 mL/min (ref >60)
Glucose, Bld: 141 mg/dL — ABNORMAL HIGH (ref 70–99)
POTASSIUM: 4.1 mmol/L (ref 3.5–5.1)
SODIUM: 138 mmol/L (ref 135–145)

## 2018-05-03 LAB — CBC
HEMATOCRIT: 18.5 % — AB (ref 40.0–52.0)
Hemoglobin: 6.4 g/dL — ABNORMAL LOW (ref 13.0–18.0)
MCH: 32.3 pg (ref 26.0–34.0)
MCHC: 34.5 g/dL (ref 32.0–36.0)
MCV: 93.5 fL (ref 80.0–100.0)
Platelets: 81 10*3/uL — ABNORMAL LOW (ref 150–440)
RBC: 1.98 MIL/uL — AB (ref 4.40–5.90)
RDW: 18.6 % — ABNORMAL HIGH (ref 11.5–14.5)
WBC: 2.7 10*3/uL — AB (ref 3.8–10.6)

## 2018-05-03 LAB — PREPARE RBC (CROSSMATCH)

## 2018-05-03 MED ORDER — SENNOSIDES-DOCUSATE SODIUM 8.6-50 MG PO TABS
1.0000 | ORAL_TABLET | Freq: Two times a day (BID) | ORAL | Status: DC
  Administered 2018-05-03 – 2018-05-05 (×5): 1 via ORAL
  Filled 2018-05-03 (×6): qty 1

## 2018-05-03 MED ORDER — VITAMIN B-12 1000 MCG PO TABS
1000.0000 ug | ORAL_TABLET | Freq: Every day | ORAL | Status: DC
  Administered 2018-05-03 – 2018-05-06 (×4): 1000 ug via ORAL
  Filled 2018-05-03 (×4): qty 1

## 2018-05-03 MED ORDER — SODIUM CHLORIDE 0.9% IV SOLUTION
Freq: Once | INTRAVENOUS | Status: AC
  Administered 2018-05-03: 14:00:00 via INTRAVENOUS

## 2018-05-03 MED ORDER — OXYCODONE HCL 5 MG PO TABS
10.0000 mg | ORAL_TABLET | ORAL | Status: DC | PRN
  Administered 2018-05-03 – 2018-05-06 (×4): 10 mg via ORAL
  Filled 2018-05-03 (×4): qty 2

## 2018-05-03 MED ORDER — OXYCODONE HCL 5 MG PO TABS
5.0000 mg | ORAL_TABLET | ORAL | Status: DC | PRN
  Administered 2018-05-03: 5 mg via ORAL
  Filled 2018-05-03: qty 1

## 2018-05-03 MED ORDER — HYDROMORPHONE HCL 1 MG/ML IJ SOLN: 0.5000 mg | INTRAMUSCULAR | Status: DC | PRN

## 2018-05-03 MED ORDER — TRAMADOL HCL 50 MG PO TABS: 50.0000 mg | ORAL_TABLET | ORAL | Status: DC | PRN

## 2018-05-03 MED ORDER — CEFAZOLIN SODIUM-DEXTROSE 2-4 GM/100ML-% IV SOLN
2.0000 g | Freq: Four times a day (QID) | INTRAVENOUS | Status: AC
  Administered 2018-05-03 (×4): 2 g via INTRAVENOUS
  Filled 2018-05-03 (×5): qty 100

## 2018-05-03 MED ORDER — ONDANSETRON HCL 4 MG PO TABS: 4.0000 mg | ORAL_TABLET | Freq: Four times a day (QID) | ORAL | Status: DC | PRN

## 2018-05-03 MED ORDER — FERROUS SULFATE 325 (65 FE) MG PO TABS
325.0000 mg | ORAL_TABLET | Freq: Two times a day (BID) | ORAL | Status: DC
  Administered 2018-05-03 – 2018-05-06 (×6): 325 mg via ORAL
  Filled 2018-05-03 (×6): qty 1

## 2018-05-03 MED ORDER — PANTOPRAZOLE SODIUM 40 MG PO TBEC
40.0000 mg | DELAYED_RELEASE_TABLET | Freq: Two times a day (BID) | ORAL | Status: DC
  Administered 2018-05-03 – 2018-05-06 (×7): 40 mg via ORAL
  Filled 2018-05-03 (×7): qty 1

## 2018-05-03 MED ORDER — FLEET ENEMA 7-19 GM/118ML RE ENEM: 1.0000 | ENEMA | Freq: Once | RECTAL | Status: DC | PRN

## 2018-05-03 MED ORDER — PHENOL 1.4 % MT LIQD
1.0000 | OROMUCOSAL | Status: DC | PRN
  Filled 2018-05-03: qty 177

## 2018-05-03 MED ORDER — ONDANSETRON HCL 4 MG/2ML IJ SOLN: 4.0000 mg | Freq: Four times a day (QID) | INTRAMUSCULAR | Status: DC | PRN

## 2018-05-03 MED ORDER — SODIUM CHLORIDE 0.9 % IV SOLN
INTRAVENOUS | Status: DC
  Administered 2018-05-03 (×2): via INTRAVENOUS

## 2018-05-03 MED ORDER — ACETAMINOPHEN 10 MG/ML IV SOLN
1000.0000 mg | Freq: Four times a day (QID) | INTRAVENOUS | Status: AC
  Administered 2018-05-03 (×3): 1000 mg via INTRAVENOUS
  Filled 2018-05-03 (×4): qty 100

## 2018-05-03 MED ORDER — METOCLOPRAMIDE HCL 10 MG PO TABS
10.0000 mg | ORAL_TABLET | Freq: Three times a day (TID) | ORAL | Status: AC
  Administered 2018-05-03 – 2018-05-04 (×7): 10 mg via ORAL
  Filled 2018-05-03 (×7): qty 1

## 2018-05-03 MED ORDER — ACETAMINOPHEN 325 MG PO TABS
325.0000 mg | ORAL_TABLET | Freq: Four times a day (QID) | ORAL | Status: DC | PRN
Start: 2018-05-03 — End: 2018-05-06

## 2018-05-03 MED ORDER — BISACODYL 10 MG RE SUPP: 10.0000 mg | Freq: Every day | RECTAL | Status: DC | PRN

## 2018-05-03 MED ORDER — METOCLOPRAMIDE HCL 10 MG PO TABS: 5.0000 mg | ORAL_TABLET | Freq: Three times a day (TID) | ORAL | Status: DC | PRN

## 2018-05-03 MED ORDER — MENTHOL 3 MG MT LOZG
1.0000 | LOZENGE | OROMUCOSAL | Status: DC | PRN
  Filled 2018-05-03: qty 9

## 2018-05-03 MED ORDER — METOCLOPRAMIDE HCL 5 MG/ML IJ SOLN: 5.0000 mg | Freq: Three times a day (TID) | INTRAMUSCULAR | Status: DC | PRN

## 2018-05-03 MED ORDER — MAGNESIUM HYDROXIDE 400 MG/5ML PO SUSP: 30.0000 mL | Freq: Every day | ORAL | Status: DC | PRN

## 2018-05-03 NOTE — Progress Notes (Signed)
Per Tammy admissions coordinator at [facility] authorization has been received. Patient's wife [name] is aware of above. Patient can D/C to [facility] when medically stable.   [employer], LCSW [phone]

## 2018-05-03 NOTE — Progress Notes (Signed)
Received pt from pacu in bed. Wife in room to stay with pt at bedside. Pt is arousable to name and able to answer simple orientation questions approply. Ted and scd on honeycomb dressing x2 to rt hip intact, heels elevated off bed, foley draining clear yellow urine. Call bell in reach, pt in low bed with bed alarm on.

## 2018-05-03 NOTE — Progress Notes (Signed)
OT Cancellation Note  Patient Details Name: Vincent Poole MRN: [id number] DOB: Feb 26, 1935   Cancelled Treatment:    Reason Eval/Treat Not Completed: Medical issues which prohibited therapy. Order received, chart reviewed. Pt noted with Hgb 6.4, plan to transfuse 1 unit RBCs. Will hold this am and re-attempt at later date/time as pt is medically appropriate.  Jeni Salles, MPH, MS, OTR/L ascom [phone] 05/03/18, 9:18 AM

## 2018-05-03 NOTE — Progress Notes (Signed)
PT Cancellation Note  Patient Details Name: Vincent Poole MRN: [id number] DOB: 04/30/1935   Cancelled Treatment:    Reason Eval/Treat Not Completed: Medical issues which prohibited therapy Pt with HGB of 6.4, to get transfusion this date.  Spoke with pt about possibly doing supine exercises this AM but he is feeling very poorly and does not wish to do even limited exercises this AM.  Will continue to monitor and attempt to do PT eval this afternoon.  Kreg Shropshire, DPT 05/03/2018, 12:59 PM

## 2018-05-03 NOTE — Progress Notes (Signed)
Clinical Education officer, museum (CSW) presented bed offers to patient and his daughter Vincent Poole. Per daughter patient's wife [name] will call CSW back with SNF choice. CSW did received a call from [name] and she chose [facility]. Per [name] patient has finished chemo and radiation. Per [facility] liaison he will start [facility] authorization today.   [employer], LCSW [phone]

## 2018-05-03 NOTE — Progress Notes (Signed)
[facility] [place] NAME: Vincent Poole    MR#:  [id number]  DATE OF BIRTH:  December 17, 1934  SUBJECTIVE:   Hemoglobin dropped this morning.  Patient is asymptomatic.  Patient will receive 1 unit PRBC  Some confusion noted this morning Daughter at bedside this am REVIEW OF SYSTEMS:    Review of Systems  Constitutional: Negative for fever, chills weight loss HENT: Negative for ear pain, nosebleeds, congestion, facial swelling, rhinorrhea, neck pain, neck stiffness and ear discharge.   Respiratory: Negative for cough, shortness of breath, wheezing  Cardiovascular: Negative for chest pain, palpitations and leg swelling.  Gastrointestinal: Negative for heartburn, abdominal pain, vomiting, diarrhea or consitpation Genitourinary: Negative for dysuria, urgency, frequency, hematuria Musculoskeletal: Negative for back pain mild joint pain Neurological: Negative for dizziness, seizures, syncope, focal weakness,  numbness and headaches.  Hematological: Does not bruise/bleed easily.  Psychiatric/Behavioral: Negative for hallucinations,  dysphoric mood ++confusion    Tolerating Diet: yes     DRUG ALLERGIES:   Allergies  Allergen Reactions  . Penicillins Other (See Comments)    Caused nervousness as a child. Patient states he took as a teenager w/o problems Has patient had a PCN reaction causing immediate rash, facial/tongue/throat swelling, SOB or lightheadedness with hypotension: No Has patient had a PCN reaction causing severe rash involving mucus membranes or skin necrosis: No Has patient had a PCN reaction that required hospitalization: No Has patient had a PCN reaction occurring within the last 10 years: No If all of the above answers are "NO", then may proceed with    VITALS:  Blood pressure 118/69, pulse (!) 125, temperature 98.2 F (36.8 C), temperature source Oral, resp. rate 18, height 5\' 11"  (1.803 m), weight 84.8 kg (187 lb), SpO2  93 %.  PHYSICAL EXAMINATION:  Constitutional: Appears well-developed and well-nourished. No distress. HENT: Normocephalic. Marland Kitchen Oropharynx is clear and moist.  Eyes: Conjunctivae and EOM are normal. PERRLA, no scleral icterus.  Neck: Normal ROM. Neck supple. No JVD. No tracheal deviation. CVS: RRR, S1/S2 +, no murmurs, no gallops, no carotid bruit.  Pulmonary: Effort and breath sounds normal, no stridor, rhonchi, wheezes, rales.  Abdominal: Soft. BS +,  no distension, tenderness, rebound or guarding.  Musculoskeletal: honeycomb dressing placed. No edema and no tenderness. +tremor Neuro: Alert. CN 2-12 grossly intact. No focal deficits. Skin: Skin is warm and dry. No rash noted. Psychiatric: some confusion noted this am.      LABORATORY PANEL:   CBC Recent Labs  Lab 05/03/18 0446  WBC 2.7*  HGB 6.4*  HCT 18.5*  PLT 81*   ------------------------------------------------------------------------------------------------------------------  Chemistries  Recent Labs  Lab 05/03/18 0446  NA 138  K 4.1  CL 108  CO2 22  GLUCOSE 141*  BUN 14  CREATININE 0.83  CALCIUM 8.2*   ------------------------------------------------------------------------------------------------------------------  Cardiac Enzymes No results for input(s): TROPONINI in the last 168 hours. ------------------------------------------------------------------------------------------------------------------  RADIOLOGY:  Dg Chest 1 View  Result Date: 05/01/2018 CLINICAL DATA:  Preoperative chest radiograph prior to RIGHT femur surgery. EXAM: CHEST  1 VIEW COMPARISON:  04/04/2018 and prior studies FINDINGS: Cardiomegaly noted. A RIGHT IJ Port-A-Cath is noted with tip overlying the mid SVC. There is no evidence of focal airspace disease, pulmonary edema, suspicious pulmonary nodule/mass, pleural effusion, or pneumothorax. No acute bony abnormalities are identified. Degenerative changes in the shoulders noted.  IMPRESSION: Cardiomegaly without evidence of active cardiopulmonary disease. Electronically Signed   By: Margarette Canada M.D.   On: 05/01/2018 14:25  Dg Pelvis 1-2 Views  Result Date: 05/01/2018 CLINICAL DATA:  Status post fall at home today. Patient is complaining of hip and thigh pain and is unable to internally rotate the leg. EXAM: PELVIS - 1-2 VIEW COMPARISON:  Right femur series of today's date FINDINGS: The bony pelvis is subjectively osteopenic. There is severe joint space loss of the right hip with irregularity of the femoral head. There is abnormal lucency in the intertrochanteric region consistent with an acute fracture. There are degenerative changes of the left hip. Double pigtail ureteral stents are present. IMPRESSION: Acute intertrochanteric fracture of the right hip. No definite acute pelvic fracture. 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 Electronically Signed   By: David  Martinique M.D.   On: 05/01/2018 13:54   Dg Knee Complete 4 Views Left  Result Date: 05/01/2018 CLINICAL DATA:  Left knee pain following a fall at home today. EXAM: LEFT KNEE - COMPLETE 4+ VIEW COMPARISON:  None. FINDINGS: The bones are subjectively mildly osteopenic. There is mild narrowing of the medial joint compartment. There is no acute tibial plateau fracture nor other acute fracture. There is mild beaking of the tibial spines. There is spurring of the superior and inferior articular margins of the patella. The visualized portions of the fibula are intact. There is no joint effusion. IMPRESSION: Mild degenerative change of the left knee. No acute fracture or dislocation. Electronically Signed   By: David  Martinique M.D.   On: 05/01/2018 13:57   Dg C-arm 1-60 Min  Result Date: 05/03/2018 CLINICAL DATA:  Intramedullary right nail fixation of the hip. EXAM: DG C-ARM 61-120 MIN; OPERATIVE RIGHT HIP WITH PELVIS COMPARISON:  None. FINDINGS: 1 minutes 30 seconds of fluoroscopic time was  utilized. Four intraoperative images were acquired of the right femur demonstrating a long-stem femoral nail without intraoperative complications noted. IMPRESSION: Fluoroscopic time utilized for placement of a long-stem right femoral nail across an intratrochanteric fracture of the right femur. Fine bony detail is limited by the C-arm fluoroscopic technique. Alignment is near anatomic. Electronically Signed   By: Ashley Royalty M.D.   On: 05/03/2018 03:26   Dg Hip Operative Unilat With Pelvis Right  Result Date: 05/03/2018 CLINICAL DATA:  Intramedullary right nail fixation of the hip. EXAM: DG C-ARM 61-120 MIN; OPERATIVE RIGHT HIP WITH PELVIS COMPARISON:  None. FINDINGS: 1 minutes 30 seconds of fluoroscopic time was utilized. Four intraoperative images were acquired of the right femur demonstrating a long-stem femoral nail without intraoperative complications noted. IMPRESSION: Fluoroscopic time utilized for placement of a long-stem right femoral nail across an intratrochanteric fracture of the right femur. Fine bony detail is limited by the C-arm fluoroscopic technique. Alignment is near anatomic. Electronically Signed   By: Ashley Royalty M.D.   On: 05/03/2018 03:26   Dg Femur Min 2 Views Right  Result Date: 05/01/2018 CLINICAL DATA:  The patient has sustained a fall at home today and is complaining of right hip and thigh pain and is unable to internally rotate the leg. EXAM: RIGHT FEMUR 2 VIEWS COMPARISON:  AP pelvis of today's date FINDINGS: There is an acute intertrochanteric fracture of the right femur. The femoral head and neck 0 grossly intact. There is severe osteoarthritic joint space loss of the right hip. The sub trochanteric region and remainder of the shaft and distal femur exhibit no acute abnormalities. IMPRESSION: There is an acute intertrochanteric fracture of the right hip. No fracture is seen more distally. Electronically Signed   By:  David  Martinique M.D.   On: 05/01/2018 13:55      ASSESSMENT AND PLAN:    82 year old male with history of bladder cancer status post chemotherapy who presented after mechanical fall and has suffered a right hip intertrochanteric fracture.  1.  Right intertrochanteric femur fracture: POD #1 INTRAMEDULLARY (IM) NAIL INTERTROCHANTRIC  Continue pain management Continued Lovenox 2 weeks postop Staples will need to be removed 2 weeks postop followed by the application of benzoin and Steri-Strips   2.  History of bladder cancer with chronic Foley catheter that was replaced 2 weeks ago. Patient will follow-up with Dr. Erlene Quan in 2 weeks. Chemotherapy on hold until patient has fully recovered.  3.  BPH: Continue Proscar and Flomax  4. Anemia acute on chronic from chronic disease, recent chemo and B12 deficiency  Patient will receive 1 unit PRBC.   CBC for a.m.  Start B12 supplementation  Follow-up with oncology outpatient.    5. Hypokalemia: This is improved with replacement  6. Thrombocytopenia: This is from chemotherapy.  Platelet count has improved.  Patient did receive platelet transfusion prior to surgery.    7.  Leukopenia due to chemotherapy: Follow-up on [facility]    Management plans discussed with the family and they are in agreement CODE STATUS: FULL  TOTAL TIME TAKING CARE OF THIS PATIENT: 24 minutes.     POSSIBLE D/C 2 days, DEPENDING ON CLINICAL CONDITION.   Maysie Parkhill M.D on 05/03/2018 at 12:13 PM  Between 7am to 6pm - Pager - [phone] After 6pm go to [email or web address] - password [email or web address] [facility]  Office  [phone]  CC: Primary care physician; [name], MD  Note: This dictation was prepared with Dragon dictation along with smaller phrase technology. Any transcriptional errors that result from this process are unintentional.

## 2018-05-03 NOTE — Evaluation (Signed)
Physical Therapy Evaluation Patient Details Name: Vincent Poole MRN: [id number] DOB: [birth year] Today's Date: 05/03/2018   History of Present Illness  83 y.o. male with a known history of anxiety, bladder cancer currently undergoing chemo, essential hypertension who is presenting after a fall and suffered R femur fx.  s/p ORIF 6/26.  Clinical Impression  Deferred mobility today as pt was actively receiving blood transfusion and had some issues with elevated HR earlier.  BP and O2 good on arrival, HR generally staying ~100 bpm during session.  Pt with some anxiety t/o the exam and with the additional 15 minutes of exercises apart from the exam, but he ultimately was willing and eager to work despite feeling poorly and having a lot of pain with all R LE movements.  Pt showed good effort, should be ready for mobility/ambulation tomorrow - plan is currently for STR and though mobility was not tested today it is highly unlikely that pt will be safe to go home and will require rehab to get back to a more functional and safe status.    Follow Up Recommendations SNF    Equipment Recommendations  None recommended by PT    Recommendations for Other Services       Precautions / Restrictions Precautions Precautions: Fall Restrictions Weight Bearing Restrictions: Yes RLE Weight Bearing: Weight bearing as tolerated      Mobility  Bed Mobility               General bed mobility comments: deferred mobility as pt was getting blood transfusion  Transfers                    Ambulation/Gait                Stairs            Wheelchair Mobility    Modified Rankin (Stroke Patients Only)       Balance Overall balance assessment: (no mobility 2/2 blood transfusion, this is his only fall)                                           Pertinent Vitals/Pain Pain Assessment: 0-10 Pain Score: 5 (up to 9/10 with most R LE acts)    Home Living  Family/patient expects to be discharged to:: Skilled nursing facility Living Arrangements: Spouse/significant other Available Help at Discharge: Family;Available PRN/intermittently Type of Home: Mobile home Home Access: Stairs to enter Entrance Stairs-Rails: Left Entrance Stairs-Number of Steps: 2 Home Layout: One level Home Equipment: Walker - 2 wheels;Cane - single point      Prior Function Level of Independence: Independent with assistive device(s)         Comments: Ambulates with SPC, minimally out of the home but able to dress, shower, etc w/o assist.  Reports no h/o falls.       Hand Dominance   Dominant Hand: Right    Extremity/Trunk Assessment   Upper Extremity Assessment Upper Extremity Assessment: Generalized weakness    Lower Extremity Assessment Lower Extremity Assessment: Generalized weakness(R LE very pain limited, poor tolerance with movement)       Communication   Communication: HOH  Cognition Arousal/Alertness: Awake/alert Behavior During Therapy: Anxious Overall Cognitive Status: Within Functional Limits for tasks assessed  General Comments      Exercises General Exercises - Lower Extremity Ankle Circles/Pumps: AROM;10 reps;Both Quad Sets: Strengthening;10 reps;Both Gluteal Sets: Strengthening;10 reps;Both Short Arc Quad: AROM;10 reps;Right Heel Slides: AAROM;10 reps;Right Hip ABduction/ADduction: AROM;10 reps;Both   Assessment/Plan    PT Assessment Patient needs continued PT services  PT Problem List Decreased strength;Decreased range of motion;Decreased activity tolerance;Decreased balance;Decreased mobility;Decreased coordination;Decreased cognition;Decreased knowledge of use of DME;Decreased safety awareness;Decreased knowledge of precautions;Pain;Cardiopulmonary status limiting activity       PT Treatment Interventions DME instruction;Gait training;Stair training;Functional  mobility training;Therapeutic activities;Therapeutic exercise;Balance training;Neuromuscular re-education;Patient/family education    PT Goals (Current goals can be found in the Care Plan section)  Acute Rehab PT Goals Patient Stated Goal: go home PT Goal Formulation: With patient Time For Goal Achievement: 05/17/18 Potential to Achieve Goals: Fair    Frequency BID   Barriers to discharge        Co-evaluation               AM-PAC PT "6 Clicks" Daily Activity  Outcome Measure Difficulty turning over in bed (including adjusting bedclothes, sheets and blankets)?: Unable Difficulty moving from lying on back to sitting on the side of the bed? : Unable Difficulty sitting down on and standing up from a chair with arms (e.g., wheelchair, bedside commode, etc,.)?: Unable Help needed moving to and from a bed to chair (including a wheelchair)?: Total Help needed walking in hospital room?: Total Help needed climbing 3-5 steps with a railing? : Total 6 Click Score: 6    End of Session Equipment Utilized During Treatment: Oxygen(2L) Activity Tolerance: Patient limited by pain Patient left: with bed alarm set;with call bell/phone within reach;with family/visitor present   PT Visit Diagnosis: Muscle weakness (generalized) (M62.81);Difficulty in walking, not elsewhere classified (R26.2)    Time: 0923-3007 PT Time Calculation (min) (ACUTE ONLY): 34 min   Charges:   PT Evaluation $PT Eval Low Complexity: 1 Low PT Treatments $Therapeutic Exercise: 8-22 mins   PT G Codes:        Kreg Shropshire, DPT 05/03/2018, 5:57 PM

## 2018-05-03 NOTE — Consult Note (Signed)
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 Bettey Costa  Date of visit: 05/03/2018    History of presenting illness-patient is a 83 year old male with muscle invasive bladder cancer who is currently undergoing chemotherapy and last received his gemcitabine on 04/26/2018.  He was admitted to the hospital after he sustained a fall and was noted to have a femur fracture.  We have been consulted for his anemia and thrombocytopenia. Patient underwent ORIF of the right intertrochanteric femur fracture yesterday night.  He did receive 1 unit of platelets during the procedure.  He does complain of pain in his right hip but is otherwise doing well.  He hopes to work with physical therapy today.  Patient did not sustain any head injury during the fall.  ECOG PS- 2  Pain scale- 5   Review of systems- Review of Systems  Constitutional: Positive for malaise/fatigue. Negative for chills, fever and weight loss.  HENT: Negative for congestion, ear discharge and nosebleeds.   Eyes: Negative for blurred vision.  Respiratory: Negative for cough, hemoptysis, sputum production, shortness of breath and wheezing.   Cardiovascular: Negative for chest pain, palpitations, orthopnea and claudication.  Gastrointestinal: Negative for abdominal pain, blood in stool, constipation, diarrhea, heartburn, melena, nausea and vomiting.  Genitourinary: Negative for dysuria, flank pain, frequency, hematuria and urgency.  Musculoskeletal: Positive for joint pain. Negative for back pain and myalgias.  Skin: Negative for rash.  Neurological: Negative for dizziness, tingling, focal weakness, seizures, weakness and headaches.  Endo/Heme/Allergies:  Does not bruise/bleed easily.  Psychiatric/Behavioral: Negative for depression and suicidal ideas. The patient does not have insomnia.     Allergies  Allergen Reactions  . Penicillins Other (See Comments)    Caused nervousness as a child. Patient states he took as a teenager w/o problems Has patient had a PCN reaction causing immediate rash, facial/tongue/throat swelling, SOB or lightheadedness with hypotension: No Has patient had a PCN reaction causing severe rash involving mucus membranes or skin necrosis: No Has patient had a PCN reaction that required hospitalization: No Has patient had a PCN reaction occurring within the last 10 years: No If all of the above answers are "NO", then may proceed with    Patient Active Problem List   Diagnosis Date Noted  . Hip fracture ([place]) 05/01/2018  . Malignant neoplasm of urinary bladder ([place]) 01/12/2018  . Goals of care, counseling/discussion 01/12/2018  . Pyelonephritis 05/31/2017  . UTI (urinary tract infection) 03/04/2017  . Urinary obstruction   . Essential hypertension 08/30/2016  . History of shingles 08/30/2016  . Prostate cancer ([place]) 08/30/2016  . Urinary retention 08/30/2016  . Medicare annual wellness visit, initial 08/01/2016  . Medicare annual wellness visit, subsequent 08/01/2016  . Sepsis ([place]) 07/11/2016  . Borderline diabetes mellitus 01/26/2016  . Vaccine counseling 01/26/2015  . Lumbar radiculitis 06/24/2014  . OA (osteoarthritis) of hip 06/24/2014  . Malignant neoplasm of prostate ([place]) 01/27/2011     Past Medical History:  Diagnosis Date  . Anxiety   . Arthritis   . Bladder cancer ([place])   . Hypertension   . Prostate cancer ([place])   . Urinary retention 2019   foley catheter place 11/2017  . UTI (urinary tract infection) 2019   frequent UTI's over last  year     Past Surgical History:  Procedure Laterality Date  . CARPAL TUNNEL RELEASE Right   . CHOLECYSTECTOMY  2004  . CYSTOSCOPY W/ URETERAL STENT  PLACEMENT Bilateral 12/27/2017   Procedure: CYSTOSCOPY WITH RETROGRADE PYELOGRAM/URETERAL STENT PLACEMENT;  Surgeon: [name], MD;  Location: [facility];  Service: Urology;  Laterality: Bilateral;  . LEG TENDON SURGERY Right 1958  . PORTA CATH INSERTION N/A 01/22/2018   Procedure: PORTA CATH INSERTION;  Surgeon: [name], MD;  Location: [facility];  Service: Cardiovascular;  Laterality: N/A;  . TRANSURETHRAL RESECTION OF BLADDER TUMOR N/A 12/27/2017   Procedure: TRANSURETHRAL RESECTION OF BLADDER TUMOR (TURBT);  Surgeon: [name], MD;  Location: [facility];  Service: Urology;  Laterality: N/A;  . TRANSURETHRAL RESECTION OF BLADDER TUMOR N/A 01/15/2018   Procedure: TRANSURETHRAL RESECTION OF BLADDER TUMOR (TURBT);  Surgeon: [name], MD;  Location: [facility];  Service: Urology;  Laterality: N/A;  Need 2 hrs for this case please    Social History   Socioeconomic History  . Marital status: Married    Spouse name: Not on file  . Number of children: Not on file  . Years of education: Not on file  . Highest education level: Not on file  Occupational History  . Occupation: retired  Scientific laboratory technician  . Financial resource strain: Not on file  . Food insecurity:    Worry: Not on file    Inability: Not on file  . Transportation needs:    Medical: Not on file    Non-medical: Not on file  Tobacco Use  . Smoking status: Never Smoker  . Smokeless tobacco: Never Used  Substance and Sexual Activity  . Alcohol use: No    Alcohol/week: 0.0 oz  . Drug use: No  . Sexual activity: Never  Lifestyle  . Physical activity:    Days per week: Not on file    Minutes per session: Not on file  . Stress: Not on file  Relationships  . Social connections:    Talks on phone: Not on file    Gets together: Not on file    Attends religious service: Not on file    Active member of club or organization: Not on file    Attends meetings of clubs or organizations: Not on file     Relationship status: Not on file  . Intimate partner violence:    Fear of current or ex partner: Not on file    Emotionally abused: Not on file    Physically abused: Not on file    Forced sexual activity: Not on file  Other Topics Concern  . Not on file  Social History Narrative  . Not on file     Family History  Problem Relation Age of Onset  . Cancer Mother   . Chronic Renal Failure Mother   . Heart disease Father      Current Facility-Administered Medications:  .  0.9 %  sodium chloride infusion, , Intravenous, Continuous, Hooten, Laurice Record, MD, Last Rate: 100 mL/hr at 05/03/18 0514 .  acetaminophen (OFIRMEV) IV 1,000 mg, 1,000 mg, Intravenous, Q6H, Hooten, Laurice Record, MD, Stopped at 05/03/18 0529 .  acetaminophen (TYLENOL) tablet 325-650 mg, 325-650 mg, Oral, Q6H PRN, Hooten, Laurice Record, MD .  bisacodyl (DULCOLAX) suppository 10 mg, 10 mg, Rectal, Daily PRN, Hooten, Laurice Record, MD .  ceFAZolin (ANCEF) IVPB 2g/100 mL premix, 2 g, Intravenous, Q6H, Hooten, Laurice Record, MD, Last Rate: 200 mL/hr at 05/03/18 0801,  2 g at 05/03/18 0801 .  ferrous sulfate tablet 325 mg, 325 mg, Oral, BID WC, Hooten, Laurice Record, MD, 325 mg at 05/03/18 0800 .  finasteride (PROSCAR) tablet 5 mg, 5 mg, Oral, Daily, Hooten, Laurice Record, MD .  HYDROmorphone (DILAUDID) injection 0.5-1 mg, 0.5-1 mg, Intravenous, Q4H PRN, Hooten, Laurice Record, MD .  loratadine (CLARITIN) tablet 10 mg, 10 mg, Oral, Daily, Hooten, Laurice Record, MD .  magnesium hydroxide (MILK OF MAGNESIA) suspension 30 mL, 30 mL, Oral, Daily PRN, Hooten, Laurice Record, MD .  menthol-cetylpyridinium (CEPACOL) lozenge 3 mg, 1 lozenge, Oral, PRN **OR** phenol (CHLORASEPTIC) mouth spray 1 spray, 1 spray, Mouth/Throat, PRN, Hooten, Laurice Record, MD .  metoCLOPramide (REGLAN) tablet 5-10 mg, 5-10 mg, Oral, Q8H PRN **OR** metoCLOPramide (REGLAN) injection 5-10 mg, 5-10 mg, Intravenous, Q8H PRN, Hooten, Laurice Record, MD .  metoCLOPramide (REGLAN) tablet 10 mg, 10 mg, Oral, TID AC & HS, Hooten, Laurice Record,  MD, 10 mg at 05/03/18 0800 .  mirabegron ER (MYRBETRIQ) tablet 50 mg, 50 mg, Oral, Daily, Hooten, James P, MD .  ondansetron (ZOFRAN) tablet 4 mg, 4 mg, Oral, Q6H PRN **OR** ondansetron (ZOFRAN) injection 4 mg, 4 mg, Intravenous, Q6H PRN, Hooten, Laurice Record, MD .  oxyCODONE (Oxy IR/ROXICODONE) immediate release tablet 10 mg, 10 mg, Oral, Q4H PRN, Hooten, Laurice Record, MD, 10 mg at 05/03/18 0800 .  oxyCODONE (Oxy IR/ROXICODONE) immediate release tablet 5 mg, 5 mg, Oral, Q4H PRN, Hooten, Laurice Record, MD, 5 mg at 05/03/18 0317 .  pantoprazole (PROTONIX) EC tablet 40 mg, 40 mg, Oral, BID, Hooten, Laurice Record, MD .  senna-docusate (Senokot-S) tablet 1 tablet, 1 tablet, Oral, BID, Hooten, Laurice Record, MD .  sodium phosphate (FLEET) 7-19 GM/118ML enema 1 enema, 1 enema, Rectal, Once PRN, Hooten, Laurice Record, MD .  tamsulosin (FLOMAX) capsule 0.4 mg, 0.4 mg, Oral, BID, Hooten, Laurice Record, MD, 0.4 mg at 05/01/18 2152 .  traMADol (ULTRAM) tablet 50-100 mg, 50-100 mg, Oral, Q4H PRN, [name], MD   Physical exam:  Vitals:   05/03/18 0235 05/03/18 0345 05/03/18 0437 05/03/18 0811  BP: 119/76 113/69 131/67 118/69  Pulse: (!) 103 100 (!) 104 (!) 125  Resp: 18 18 18 18   Temp: (!) 97.3 F (36.3 C) (!) 97.3 F (36.3 C) 98.1 F (36.7 C) 98.2 F (36.8 C)  TempSrc: Axillary Axillary Axillary Oral  SpO2: 100% 100% 100% 93%  Weight:      Height:       Physical Exam  Constitutional: He is oriented to person, place, and time.  Elderly frail gentleman in no acute distress  HENT:  Head: Normocephalic and atraumatic.  Eyes: Pupils are equal, round, and reactive to light. EOM are normal.  Neck: Normal range of motion.  Cardiovascular: Normal rate, regular rhythm and normal heart sounds.  Pulmonary/Chest: Effort normal and breath sounds normal.  Abdominal: Soft. Bowel sounds are normal.  Foley in place  Neurological: He is alert and oriented to person, place, and time.  Skin: Skin is warm and dry.  Bandage in place over  right elbow       CMP Latest Ref Rng & Units 05/03/2018  Glucose 70 - 99 mg/dL 141(H)  BUN 8 - 23 mg/dL 14  Creatinine 0.61 - 1.24 mg/dL 0.83  Sodium 135 - 145 mmol/L 138  Potassium 3.5 - 5.1 mmol/L 4.1  Chloride 98 - 111 mmol/L 108  CO2 22 - 32 mmol/L 22  Calcium 8.9 - 10.3 mg/dL 8.2(L)  Total Protein 6.5 - 8.1 g/dL -  Total Bilirubin 0.3 - 1.2 mg/dL -  Alkaline Phos 38 - 126 U/L -  AST 15 - 41 U/L -  ALT 17 - 63 U/L -   CBC Latest Ref Rng & Units 05/03/2018  WBC 3.8 - 10.6 K/uL 2.7(L)  Hemoglobin 13.0 - 18.0 g/dL 6.4(L)  Hematocrit 40.0 - 52.0 % 18.5(L)  Platelets 150 - 440 K/uL 81(L)    @IMAGES @  Dg Chest 1 View  Result Date: 05/01/2018 CLINICAL DATA:  Preoperative chest radiograph prior to RIGHT femur surgery. EXAM: CHEST  1 VIEW COMPARISON:  04/04/2018 and prior studies FINDINGS: Cardiomegaly noted. A RIGHT IJ Port-A-Cath is noted with tip overlying the mid SVC. There is no evidence of focal airspace disease, pulmonary edema, suspicious pulmonary nodule/mass, pleural effusion, or pneumothorax. No acute bony abnormalities are identified. Degenerative changes in the shoulders noted. IMPRESSION: Cardiomegaly without evidence of active cardiopulmonary disease. Electronically Signed   By: Margarette Canada M.D.   On: 05/01/2018 14:25   Dg Pelvis 1-2 Views  Result Date: 05/01/2018 CLINICAL DATA:  Status post fall at home today. Patient is complaining of hip and thigh pain and is unable to internally rotate the leg. EXAM: PELVIS - 1-2 VIEW COMPARISON:  Right femur series of today's date FINDINGS: The bony pelvis is subjectively osteopenic. There is severe joint space loss of the right hip with irregularity of the femoral head. There is abnormal lucency in the intertrochanteric region consistent with an acute fracture. There are degenerative changes of the left hip. Double pigtail ureteral stents are present. IMPRESSION: Acute intertrochanteric fracture of the right hip. No definite acute  pelvic fracture. 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 Electronically Signed   By: David  Martinique M.D.   On: 05/01/2018 13:54   Ct Chest W Contrast  Result Date: 04/04/2018 CLINICAL DATA:  Restaging bladder and prostate cancer. EXAM: CT CHEST, ABDOMEN, AND PELVIS WITH CONTRAST TECHNIQUE: Multidetector CT imaging of the chest, abdomen and pelvis was performed following the standard protocol during bolus administration of intravenous contrast. CONTRAST:  174mL ISOVUE-300 IOPAMIDOL (ISOVUE-300) INJECTION 61% COMPARISON:  CT chest 12/28/2017 and CT abdomen pelvis 12/26/2017. FINDINGS: CT CHEST FINDINGS Cardiovascular: Right IJ power port terminates in the SVC. Atherosclerotic calcification of the arterial vasculature, including coronary arteries. Heart size normal. No pericardial effusion. Mediastinum/Nodes: No pathologically enlarged mediastinal, hilar or axillary lymph nodes. Esophagus is grossly unremarkable. Lungs/Pleura: Probable scarring in the posterior segment right upper lobe. Additional scattered pulmonary parenchymal scarring bilaterally. Lungs are otherwise clear. Calcified pleural plaques bilaterally. No pleural fluid. Airway is unremarkable. Musculoskeletal: Degenerative changes in the spine. No worrisome lytic or sclerotic lesions. Degenerative changes in both shoulders. CT ABDOMEN PELVIS FINDINGS Hepatobiliary: Liver is unremarkable. Cholecystectomy. No biliary ductal dilatation. Pancreas: Calcifications are seen in the pancreatic body. Spleen: Negative. Adrenals/Urinary Tract: Adrenal glands are unremarkable. Fluid density 3.7 cm low-attenuation lesion in the right kidney is likely a cyst. Additional subcentimeter low-attenuation lesions in the left kidney are too small to definitively characterize. Bilateral double-J ureteral stents are in place with moderate right hydronephrosis and mild left hydronephrosis. Bilateral periureteric stranding, right greater than  left. Foley catheter is seen in a decompressed bladder. Bladder appears thick-walled with surrounding haziness. Stomach/Bowel: Stomach, small bowel, appendix and colon are unremarkable. Vascular/Lymphatic: Atherosclerotic calcification of the arterial vasculature without abdominal aortic aneurysm. No pathologically enlarged lymph nodes. Reproductive: Prostate is visualized. Other: Mild presacral edema, as before. No free fluid.  Mesenteries and peritoneum are otherwise unremarkable. Musculoskeletal: Advanced degenerative changes in the spine and hips. No worrisome lytic or sclerotic lesions. IMPRESSION: 1. Bilateral hydronephrosis, right greater than left, with double-J ureteral stents in place. 2. Bladder wall thickening and surrounding haziness, similar to 12/26/2017. Underlying malignancy is not excluded. 3. Aortic atherosclerosis (ICD10-170.0). Coronary artery calcification. 4. Asbestos related pleural disease. 5. Chronic calcific pancreatitis. Electronically Signed   By: Lorin Picket M.D.   On: 04/04/2018 14:13   Ct Abdomen Pelvis W Contrast  Result Date: 04/04/2018 CLINICAL DATA:  Restaging bladder and prostate cancer. EXAM: CT CHEST, ABDOMEN, AND PELVIS WITH CONTRAST TECHNIQUE: Multidetector CT imaging of the chest, abdomen and pelvis was performed following the standard protocol during bolus administration of intravenous contrast. CONTRAST:  130mL ISOVUE-300 IOPAMIDOL (ISOVUE-300) INJECTION 61% COMPARISON:  CT chest 12/28/2017 and CT abdomen pelvis 12/26/2017. FINDINGS: CT CHEST FINDINGS Cardiovascular: Right IJ power port terminates in the SVC. Atherosclerotic calcification of the arterial vasculature, including coronary arteries. Heart size normal. No pericardial effusion. Mediastinum/Nodes: No pathologically enlarged mediastinal, hilar or axillary lymph nodes. Esophagus is grossly unremarkable. Lungs/Pleura: Probable scarring in the posterior segment right upper lobe. Additional scattered pulmonary  parenchymal scarring bilaterally. Lungs are otherwise clear. Calcified pleural plaques bilaterally. No pleural fluid. Airway is unremarkable. Musculoskeletal: Degenerative changes in the spine. No worrisome lytic or sclerotic lesions. Degenerative changes in both shoulders. CT ABDOMEN PELVIS FINDINGS Hepatobiliary: Liver is unremarkable. Cholecystectomy. No biliary ductal dilatation. Pancreas: Calcifications are seen in the pancreatic body. Spleen: Negative. Adrenals/Urinary Tract: Adrenal glands are unremarkable. Fluid density 3.7 cm low-attenuation lesion in the right kidney is likely a cyst. Additional subcentimeter low-attenuation lesions in the left kidney are too small to definitively characterize. Bilateral double-J ureteral stents are in place with moderate right hydronephrosis and mild left hydronephrosis. Bilateral periureteric stranding, right greater than left. Foley catheter is seen in a decompressed bladder. Bladder appears thick-walled with surrounding haziness. Stomach/Bowel: Stomach, small bowel, appendix and colon are unremarkable. Vascular/Lymphatic: Atherosclerotic calcification of the arterial vasculature without abdominal aortic aneurysm. No pathologically enlarged lymph nodes. Reproductive: Prostate is visualized. Other: Mild presacral edema, as before. No free fluid. Mesenteries and peritoneum are otherwise unremarkable. Musculoskeletal: Advanced degenerative changes in the spine and hips. No worrisome lytic or sclerotic lesions. IMPRESSION: 1. Bilateral hydronephrosis, right greater than left, with double-J ureteral stents in place. 2. Bladder wall thickening and surrounding haziness, similar to 12/26/2017. Underlying malignancy is not excluded. 3. Aortic atherosclerosis (ICD10-170.0). Coronary artery calcification. 4. Asbestos related pleural disease. 5. Chronic calcific pancreatitis. Electronically Signed   By: Lorin Picket M.D.   On: 04/04/2018 14:13   Dg Knee Complete 4 Views  Left  Result Date: 05/01/2018 CLINICAL DATA:  Left knee pain following a fall at home today. EXAM: LEFT KNEE - COMPLETE 4+ VIEW COMPARISON:  None. FINDINGS: The bones are subjectively mildly osteopenic. There is mild narrowing of the medial joint compartment. There is no acute tibial plateau fracture nor other acute fracture. There is mild beaking of the tibial spines. There is spurring of the superior and inferior articular margins of the patella. The visualized portions of the fibula are intact. There is no joint effusion. IMPRESSION: Mild degenerative change of the left knee. No acute fracture or dislocation. Electronically Signed   By: David  Martinique M.D.   On: 05/01/2018 13:57   Dg C-arm 1-60 Min  Result Date: 05/03/2018 CLINICAL DATA:  Intramedullary right nail fixation of the hip. EXAM: DG C-ARM 61-120 MIN; OPERATIVE RIGHT HIP WITH  PELVIS COMPARISON:  None. FINDINGS: 1 minutes 30 seconds of fluoroscopic time was utilized. Four intraoperative images were acquired of the right femur demonstrating a long-stem femoral nail without intraoperative complications noted. IMPRESSION: Fluoroscopic time utilized for placement of a long-stem right femoral nail across an intratrochanteric fracture of the right femur. Fine bony detail is limited by the C-arm fluoroscopic technique. Alignment is near anatomic. Electronically Signed   By: Ashley Royalty M.D.   On: 05/03/2018 03:26   Dg Hip Operative Unilat With Pelvis Right  Result Date: 05/03/2018 CLINICAL DATA:  Intramedullary right nail fixation of the hip. EXAM: DG C-ARM 61-120 MIN; OPERATIVE RIGHT HIP WITH PELVIS COMPARISON:  None. FINDINGS: 1 minutes 30 seconds of fluoroscopic time was utilized. Four intraoperative images were acquired of the right femur demonstrating a long-stem femoral nail without intraoperative complications noted. IMPRESSION: Fluoroscopic time utilized for placement of a long-stem right femoral nail across an intratrochanteric fracture of the  right femur. Fine bony detail is limited by the C-arm fluoroscopic technique. Alignment is near anatomic. Electronically Signed   By: Ashley Royalty M.D.   On: 05/03/2018 03:26   Dg Femur Min 2 Views Right  Result Date: 05/01/2018 CLINICAL DATA:  The patient has sustained a fall at home today and is complaining of right hip and thigh pain and is unable to internally rotate the leg. EXAM: RIGHT FEMUR 2 VIEWS COMPARISON:  AP pelvis of today's date FINDINGS: There is an acute intertrochanteric fracture of the right femur. The femoral head and neck 0 grossly intact. There is severe osteoarthritic joint space loss of the right hip. The sub trochanteric region and remainder of the shaft and distal femur exhibit no acute abnormalities. IMPRESSION: There is an acute intertrochanteric fracture of the right hip. No fracture is seen more distally. Electronically Signed   By: David  Martinique M.D.   On: 05/01/2018 13:55    Assessment and plan- Patient is a 82 y.o. male with locally advanced muscle invasive bladder cancer currently undergoing chemotherapy.  Last cycle of gemcitabine was on 04/26/2018.  He was admitted for a fall and right intertrochanteric femur fracture status post fixation  1. Patient received a unit of platelets during his surgery yesterday and his platelet count is up from 43-81.  Patient has typically not had significant thrombocytopenia with this regimen before and was almost towards the end of his regimen.  I do anticipate that his platelet count should hold up and start improving over the next few days and patient should hopefully not require any more platelet transfusions.  2.  Patient's hemoglobin however has dropped down to 6.4.  I suspect this is primarily due to his fracture and surgery and partly due to his chemotherapy.  Recommend transfusing him 1 unit of PRBC.  Iron studies B12 and folate were ordered yesterday and B12 levels were significantly low at 73.  Recommend giving him daily B12 x7  and I will arrange for outpatient B12 subsequently.  Iron studies are suggestive of anemia of chronic disease.  He does not need IV iron at this time.  Folate levels were normal  3 patient also has mild leukopenia and his white count is 2.7 today.  I have added on a differential at this time.  If his [place] is less than 1 I would give him Neupogen although he does not have any neutropenic fever at this time given that he has had recurrent UTIs in the past and is currently in the postoperative period. If [place]  is less than 1 prophylactic antibiotics could also be considered for the next few days  4. Chemotherapy will be on hold until he recovers.  Patient is already completed 5 cycles of chemotherapy and was originally planning to get 6 cycles of chemotherapy.     Thank you for this kind referral and the opportunity to participate in the care of this patient   Visit Diagnosis 1. Closed fracture of right hip, initial encounter ([place])   2. Fall   3. Hip fracture requiring operative repair [facility])     Dr. Randa Evens, MD, MPH [facility] at [facility] [phone] 05/03/2018  8:22 AM

## 2018-05-03 NOTE — Anesthesia Postprocedure Evaluation (Signed)
Anesthesia Post Note  Patient: Vincent Poole  Procedure(s) Performed: INTRAMEDULLARY (IM) NAIL INTERTROCHANTRIC (Right Hip)  Patient location during evaluation: PACU Anesthesia Type: General Level of consciousness: awake and alert Pain management: pain level controlled Vital Signs Assessment: post-procedure vital signs reviewed and stable Respiratory status: spontaneous breathing, nonlabored ventilation, respiratory function stable and patient connected to nasal cannula oxygen Cardiovascular status: blood pressure returned to baseline and stable Postop Assessment: no apparent nausea or vomiting Anesthetic complications: no     Last Vitals:  Vitals:   05/02/18 2342 05/03/18 0004  BP: 112/66 117/67  Pulse: 97 (!) 104  Resp: 12 18  Temp: (!) 36.1 C 36.4 C  SpO2: 97% 98%    Last Pain:  Vitals:   05/03/18 0004  TempSrc: Axillary  PainSc:                  Precious Haws Brando Taves

## 2018-05-03 NOTE — Progress Notes (Signed)
   Subjective: 1 Day Post-Op Procedure(s) (LRB): INTRAMEDULLARY (IM) NAIL INTERTROCHANTRIC (Right) Patient reports pain as mild.   Patient is well, and has had no acute complaints or problems We will start therapy today.  Plan is to go Rehab after hospital stay. no nausea and no vomiting Patient denies any chest pains or shortness of breath. Objective: Vital signs in last 24 hours: Temp:  [96.9 F (36.1 C)-99.7 F (37.6 C)] 98.2 F (36.8 C) (06/27 0811) Pulse Rate:  [79-125] 125 (06/27 0811) Resp:  [12-20] 18 (06/27 0811) BP: (111-136)/(48-87) 118/69 (06/27 0811) SpO2:  [91 %-100 %] 93 % (06/27 0811) well approximated incision Heels are non tender and elevated off the bed using rolled towels Intake/Output from previous day: 06/26 0701 - 06/27 0700 In: 3950.3 [I.V.:3075; Blood:277; IV Piggyback:598.3] Out: 9702 [Urine:1220; Blood:350] Intake/Output this shift: No intake/output data recorded.  Recent Labs    05/02/18 0636 05/03/18 0446  HGB 7.8* 6.4*   Recent Labs    05/02/18 0636 05/03/18 0446  WBC 2.3* 2.7*  RBC 2.52*  2.49* 1.98*  HCT 23.5* 18.5*  PLT 43* 81*   Recent Labs    05/02/18 0636 05/03/18 0446  NA 137 138  K 3.4* 4.1  CL 107 108  CO2 24 22  BUN 17 14  CREATININE 0.89 0.83  GLUCOSE 134* 141*  CALCIUM 8.3* 8.2*   No results for input(s): LABPT, INR in the last 72 hours.  EXAM General - Patient is Alert, Appropriate and Oriented Extremity - Neurologically intact Neurovascular intact Sensation intact distally Intact pulses distally Dorsiflexion/Plantar flexion intact No cellulitis present Compartment soft Dressing - dressing C/D/I Motor Function - intact, moving foot and toes well on exam.    Past Medical History:  Diagnosis Date  . Anxiety   . Arthritis   . Bladder cancer ([place])   . Hypertension   . Prostate cancer ([place])   . Urinary retention 2019   foley catheter place 11/2017  . UTI (urinary tract infection) 2019   frequent  UTI's over last year    Assessment/Plan: 1 Day Post-Op Procedure(s) (LRB): INTRAMEDULLARY (IM) NAIL INTERTROCHANTRIC (Right) Active Problems:   Hip fracture ([facility])  Estimated body mass index is 26.08 kg/m as calculated from the following:   Height as of this encounter: 5\' 11"  (1.803 m).   Weight as of this encounter: 84.8 kg (187 lb). Advance diet Up with therapy Discharge to SNF  Labs: Hemoglobin 6.4 DVT Prophylaxis - Lovenox, Foot Pumps and TED hose Weight-Bearing as tolerated to right leg D/C O2 and Pulse OX and try on Room Air Begin working on bowel movement We will transfuse 1 unit packed RBCs Hemoglobin tomorrow morning Follow-up [facility] 2 weeks Continued Lovenox 2 weeks postop Staples will need to be removed 2 weeks postop followed by the application of benzoin and Steri-Strips  Jon R. Solon Gideon 05/03/2018, 8:23 AM

## 2018-05-04 ENCOUNTER — Encounter: Payer: Self-pay | Admitting: Orthopedic Surgery

## 2018-05-04 LAB — BASIC METABOLIC PANEL
ANION GAP: 7 (ref 5–15)
BUN: 13 mg/dL (ref 8–23)
CO2: 22 mmol/L (ref 22–32)
Calcium: 7.9 mg/dL — ABNORMAL LOW (ref 8.9–10.3)
Chloride: 106 mmol/L (ref 98–111)
Creatinine, Ser: 0.81 mg/dL (ref 0.61–1.24)
GFR calc non Af Amer: >60 mL/min (ref >60)
Glucose, Bld: 110 mg/dL — ABNORMAL HIGH (ref 70–99)
POTASSIUM: 3.4 mmol/L — AB (ref 3.5–5.1)
Sodium: 135 mmol/L (ref 135–145)

## 2018-05-04 LAB — CBC
HEMATOCRIT: 19.6 % — AB (ref 40.0–52.0)
Hemoglobin: 6.8 g/dL — ABNORMAL LOW (ref 13.0–18.0)
MCH: 31.4 pg (ref 26.0–34.0)
MCHC: 34.8 g/dL (ref 32.0–36.0)
MCV: 90.4 fL (ref 80.0–100.0)
Platelets: 55 10*3/uL — ABNORMAL LOW (ref 150–440)
RBC: 2.17 MIL/uL — ABNORMAL LOW (ref 4.40–5.90)
RDW: 17.4 % — ABNORMAL HIGH (ref 11.5–14.5)
WBC: 3.1 10*3/uL — AB (ref 3.8–10.6)

## 2018-05-04 LAB — HEMOGLOBIN AND HEMATOCRIT, BLOOD
HCT: 22.1 % — ABNORMAL LOW (ref 40.0–52.0)
Hemoglobin: 7.5 g/dL — ABNORMAL LOW (ref 13.0–18.0)

## 2018-05-04 LAB — PREPARE RBC (CROSSMATCH)

## 2018-05-04 MED ORDER — POTASSIUM CHLORIDE CRYS ER 20 MEQ PO TBCR
40.0000 meq | EXTENDED_RELEASE_TABLET | Freq: Once | ORAL | Status: AC
  Administered 2018-05-04: 40 meq via ORAL
  Filled 2018-05-04: qty 2

## 2018-05-04 MED ORDER — SODIUM CHLORIDE 0.9% IV SOLUTION
Freq: Once | INTRAVENOUS | Status: AC
  Administered 2018-05-04: 14:00:00 via INTRAVENOUS

## 2018-05-04 MED ORDER — LIDOCAINE-PRILOCAINE 2.5-2.5 % EX CREA
TOPICAL_CREAM | Freq: Once | CUTANEOUS | Status: AC
  Administered 2018-05-04: 12:00:00 via TOPICAL
  Filled 2018-05-04: qty 5

## 2018-05-04 NOTE — Progress Notes (Signed)
Per MD patient is not stable for D/C today. Per Tammy admissions coordinator at [facility] patient can come to [facility] over the weekend if stable. Clinical Social Worker (CSW) will continue to follow and assist as needed.   [employer], LCSW [phone]

## 2018-05-04 NOTE — Care Management Important Message (Signed)
Important Message  Patient Details  Name: Vincent Poole MRN: [id number] Date of Birth: 08-May-1935   Medicare Important Message Given:  Yes    Juliann Pulse A Denym Christenberry 05/04/2018, 10:16 AM

## 2018-05-04 NOTE — Progress Notes (Addendum)
   Subjective: 2 Days Post-Op Procedure(s) (LRB): INTRAMEDULLARY (IM) NAIL INTERTROCHANTRIC (Right) Patient reports pain as 6 on 0-10 scale and moderate.  No real change in condition Patient is well, and has had no acute complaints or problems Therapy held yesterday secondary to hemoglobin of 6.4 Plan is to go Rehab after hospital stay. no nausea and no vomiting Patient denies any chest pains or shortness of breath. Objective: Vital signs in last 24 hours: Temp:  [97.9 F (36.6 C)-100.5 F (38.1 C)] 98.6 F (37 C) (06/28 0744) Pulse Rate:  [108-130] 110 (06/28 0744) Resp:  [16-24] 18 (06/28 0744) BP: (107-148)/(56-75) 111/67 (06/28 0744) SpO2:  [90 %-100 %] 94 % (06/28 0744) well approximated incision Heels are non tender and elevated off the bed using rolled towels Intake/Output from previous day: 06/27 0701 - 06/28 0700 In: 3148.6 [P.O.:1080; I.V.:1593.3; Blood:375.2; IV Piggyback:100] Out: 650 [Urine:650] Intake/Output this shift: No intake/output data recorded.  Recent Labs    05/02/18 0636 05/03/18 0446 05/03/18 1936 05/04/18 0704  HGB 7.8* 6.4* 7.7* 6.8*   Recent Labs    05/03/18 0446 05/03/18 1936 05/04/18 0704  WBC 2.7*  --  3.1*  RBC 1.98*  --  2.17*  HCT 18.5* 23.2* 19.6*  PLT 81*  --  55*   Recent Labs    05/03/18 0446 05/04/18 0704  NA 138 135  K 4.1 3.4*  CL 108 106  CO2 22 22  BUN 14 13  CREATININE 0.83 0.81  GLUCOSE 141* 110*  CALCIUM 8.2* 7.9*   No results for input(s): LABPT, INR in the last 72 hours.  EXAM General - Patient is Alert, Appropriate and Oriented Extremity - Neurologically intact Neurovascular intact Sensation intact distally Intact pulses distally Dorsiflexion/Plantar flexion intact No cellulitis present Compartment soft Dressing - dressing C/D/I Motor Function - intact, moving foot and toes well on exam.    Past Medical History:  Diagnosis Date  . Anxiety   . Arthritis   . Bladder cancer ([place])   .  Hypertension   . Prostate cancer ([place])   . Urinary retention 2019   foley catheter place 11/2017  . UTI (urinary tract infection) 2019   frequent UTI's over last year    Assessment/Plan: 2 Days Post-Op Procedure(s) (LRB): INTRAMEDULLARY (IM) NAIL INTERTROCHANTRIC (Right) Active Problems:   Hip fracture ([facility])  Estimated body mass index is 26.08 kg/m as calculated from the following:   Height as of this encounter: 5\' 11"  (1.803 m).   Weight as of this encounter: 84.8 kg (187 lb). Up with therapy Discharge to SNF medically cleared  Labs: Hemoglobin still 6.8 DVT Prophylaxis - Lovenox, Foot Pumps and TED hose Weight-Bearing as tolerated to right leg Patient needs a bowel movement Will transfuse 1 more unit packed RBCs Hemoglobin in the morning Follow-up [facility] 2 weeks Continued Lovenox 2 weeks postop Staples will need to be removed 2 weeks postop followed by the application of benzoin and Steri-Strips     Harol Shabazz R. [place] 05/04/2018, 8:12 AM

## 2018-05-04 NOTE — Progress Notes (Signed)
PT Cancellation Note  Patient Details Name: Vincent Poole MRN: [id number] DOB: 08/07/1935   Cancelled Treatment:    Reason Eval/Treat Not Completed: Medical issues which prohibited therapy Pt getting blood transfusion this afternoon, will try back tomorrow. Kreg Shropshire, DPT 05/04/2018, 5:08 PM

## 2018-05-04 NOTE — Progress Notes (Addendum)
OT Cancellation Note  Patient Details Name: Vincent Poole MRN: [id number] DOB: November 28, 1934   Cancelled Treatment:    Reason Eval/Treat Not Completed: Other (comment). Pt with nursing to start blood transfusion upon attempt, RN requesting OT come back later. Will re-attempt OT evaluation at later date/time as pt is available and medically appropriate.   Jeni Salles, MPH, MS, OTR/L ascom [phone] 05/04/18, 9:52 AM

## 2018-05-04 NOTE — Progress Notes (Signed)
Physical Therapy Treatment Patient Details Name: Vincent Poole MRN: [id number] DOB: 01/19/1935 Today's Date: 05/04/2018    History of Present Illness 83 y.o. male with a known history of anxiety, bladder cancer currently undergoing chemo, essential hypertension who is presenting after a fall and suffered R femur fx.  s/p ORIF 6/26.    PT Comments    Pt continues to be anxious about most aspects of PT session with need for rest breaks between reps, encouragement and reinforcement regarding the need to get working early after sx with PT and that sitting up and trying to stand/walk are typical parts of in-hospital PT.  Pt with a lot of pain t/o the session, but did show good effort as he was able.  Pt ultimately very limited with all aspects of PT care, did show good effort with getting to standing and with exercises but remains highly limited functionally 2/2 pain, weakness, fear.   Follow Up Recommendations  SNF     Equipment Recommendations       Recommendations for Other Services       Precautions / Restrictions Precautions Precautions: Fall Restrictions Weight Bearing Restrictions: Yes RLE Weight Bearing: Weight bearing as tolerated    Mobility  Bed Mobility Overal bed mobility: Needs Assistance Bed Mobility: Sit to Supine;Supine to Sit     Supine to sit: Mod assist;+2 for physical assistance Sit to supine: Mod assist;+2 for physical assistance      Transfers Overall transfer level: Needs assistance Equipment used: Rolling walker (2 wheeled) Transfers: Sit to/from Stand Sit to Stand: Max assist         General transfer comment: Pt anxious about getting up and needed heavy assist to rise.  Once up and weight shifted to walker he did not need as heavy assist, but is still reliant on assist to stay upright  Ambulation/Gait             General Gait Details: Unable to ambulate but pt did manange to do some weight shifting and small, shuffling, side steps at  EOB with a lot of encouragement and assist   Stairs             Wheelchair Mobility    Modified Rankin (Stroke Patients Only)       Balance Overall balance assessment: Needs assistance Sitting-balance support: Bilateral upper extremity supported Sitting balance-Leahy Scale: Fair     Standing balance support: Bilateral upper extremity supported Standing balance-Leahy Scale: Poor Standing balance comment: heavily reliant on walker/UEs to maintain upright                            Cognition Arousal/Alertness: Awake/alert Behavior During Therapy: Anxious Overall Cognitive Status: Within Functional Limits for tasks assessed                                        Exercises General Exercises - Lower Extremity Ankle Circles/Pumps: AROM;10 reps Quad Sets: Strengthening;10 reps Gluteal Sets: Strengthening;10 reps Short Arc Quad: AROM;10 reps Heel Slides: AAROM;10 reps Hip ABduction/ADduction: AROM;10 reps    General Comments        Pertinent Vitals/Pain Pain Assessment: 0-10 Pain Score: 7     Home Living                      Prior Function  PT Goals (current goals can now be found in the care plan section) Progress towards PT goals: Progressing toward goals    Frequency    BID      PT Plan Current plan remains appropriate    Co-evaluation              AM-PAC PT "6 Clicks" Daily Activity  Outcome Measure  Difficulty turning over in bed (including adjusting bedclothes, sheets and blankets)?: Unable Difficulty moving from lying on back to sitting on the side of the bed? : Unable Difficulty sitting down on and standing up from a chair with arms (e.g., wheelchair, bedside commode, etc,.)?: Unable Help needed moving to and from a bed to chair (including a wheelchair)?: Total Help needed walking in hospital room?: Total Help needed climbing 3-5 steps with a railing? : Total 6 Click Score: 6     End of Session   Activity Tolerance: Patient limited by pain Patient left: with bed alarm set;with call bell/phone within reach;with family/visitor present   PT Visit Diagnosis: Muscle weakness (generalized) (M62.81);Difficulty in walking, not elsewhere classified (R26.2)     Time: 7371-0626 PT Time Calculation (min) (ACUTE ONLY): 40 min  Charges:  $Therapeutic Exercise: 23-37 mins $Therapeutic Activity: 8-22 mins                     Kreg Shropshire, DPT 05/04/2018, 10:31 AM

## 2018-05-04 NOTE — Progress Notes (Signed)
OT Cancellation Note  Patient Details Name: WASEEM SUESS MRN: [id number] DOB: 05-27-1935   Cancelled Treatment:    Reason Eval/Treat Not Completed: Medical issues which prohibited therapy. Spoke with RN. Pt still receiving blood transfusion. Will re-attempt OT evaluation next date as pt is medically appropriate.   Jeni Salles, MPH, MS, OTR/L ascom [phone] 05/04/18, 2:25 PM

## 2018-05-05 LAB — BPAM PLATELET PHERESIS
BLOOD PRODUCT EXPIRATION DATE: 201906292359
BLOOD PRODUCT EXPIRATION DATE: 201906292359
ISSUE DATE / TIME: 201906261835
ISSUE DATE / TIME: 201906262131
UNIT TYPE AND RH: 6200
Unit Type and Rh: 6200

## 2018-05-05 LAB — BPAM RBC
BLOOD PRODUCT EXPIRATION DATE: 201907162359
Blood Product Expiration Date: 201907172359
Blood Product Expiration Date: 201907172359
ISSUE DATE / TIME: 201906271348
ISSUE DATE / TIME: 201906281505
UNIT TYPE AND RH: 600
UNIT TYPE AND RH: 600
Unit Type and Rh: 600

## 2018-05-05 LAB — BASIC METABOLIC PANEL
Anion gap: 6 (ref 5–15)
BUN: 13 mg/dL (ref 8–23)
CALCIUM: 7.8 mg/dL — AB (ref 8.9–10.3)
CO2: 23 mmol/L (ref 22–32)
CREATININE: 0.84 mg/dL (ref 0.61–1.24)
Chloride: 106 mmol/L (ref 98–111)
GFR calc Af Amer: >60 mL/min (ref >60)
GLUCOSE: 105 mg/dL — AB (ref 70–99)
POTASSIUM: 3.3 mmol/L — AB (ref 3.5–5.1)
Sodium: 135 mmol/L (ref 135–145)

## 2018-05-05 LAB — TYPE AND SCREEN
ABO/RH(D): A NEG
Antibody Screen: NEGATIVE
UNIT DIVISION: 0
Unit division: 0
Unit division: 0

## 2018-05-05 LAB — PREPARE PLATELET PHERESIS
UNIT DIVISION: 0
Unit division: 0

## 2018-05-05 LAB — CBC
HCT: 22 % — ABNORMAL LOW (ref 40.0–52.0)
Hemoglobin: 7.6 g/dL — ABNORMAL LOW (ref 13.0–18.0)
MCH: 31.2 pg (ref 26.0–34.0)
MCHC: 34.6 g/dL (ref 32.0–36.0)
MCV: 90.2 fL (ref 80.0–100.0)
PLATELETS: 46 10*3/uL — AB (ref 150–440)
RBC: 2.43 MIL/uL — ABNORMAL LOW (ref 4.40–5.90)
RDW: 17.2 % — AB (ref 11.5–14.5)
WBC: 2.9 10*3/uL — ABNORMAL LOW (ref 3.8–10.6)

## 2018-05-05 LAB — MAGNESIUM: Magnesium: 1.7 mg/dL (ref 1.7–2.4)

## 2018-05-05 MED ORDER — POTASSIUM CHLORIDE 20 MEQ PO PACK
20.0000 meq | PACK | Freq: Two times a day (BID) | ORAL | Status: AC
  Administered 2018-05-05 (×2): 20 meq via ORAL
  Filled 2018-05-05 (×2): qty 1

## 2018-05-05 MED ORDER — MAGNESIUM SULFATE IN D5W 1-5 GM/100ML-% IV SOLN
1.0000 g | Freq: Once | INTRAVENOUS | Status: AC
  Administered 2018-05-05: 1 g via INTRAVENOUS
  Filled 2018-05-05: qty 100

## 2018-05-05 MED ORDER — POTASSIUM CHLORIDE CRYS ER 20 MEQ PO TBCR: 40.0000 meq | EXTENDED_RELEASE_TABLET | Freq: Once | ORAL | Status: DC

## 2018-05-05 NOTE — Progress Notes (Signed)
[facility] [place] NAME: Vincent Poole    MR#:  [id number]  DATE OF BIRTH:  1935-07-18  SUBJECTIVE:   Hemoglobin stable this morning.  Patient is asymptomatic.  Hemoglobin 7.6 status post blood transfusion Wife at bedside REVIEW OF SYSTEMS:    Review of Systems  Constitutional: Negative for fever, chills weight loss HENT: Negative for ear pain, nosebleeds, congestion, facial swelling, rhinorrhea, neck pain, neck stiffness and ear discharge.   Respiratory: Negative for cough, shortness of breath, wheezing  Cardiovascular: Negative for chest pain, palpitations and leg swelling.  Gastrointestinal: Negative for heartburn, abdominal pain, vomiting, diarrhea or consitpation Genitourinary: Negative for dysuria, urgency, frequency, hematuria Musculoskeletal: Negative for back pain mild joint pain Neurological: Negative for dizziness, seizures, syncope, focal weakness,  numbness and headaches.  Hematological: Does not bruise/bleed easily.  Psychiatric/Behavioral: Negative for hallucinations,  dysphoric mood ++confusion    Tolerating Diet: yes     DRUG ALLERGIES:   Allergies  Allergen Reactions  . Penicillins Other (See Comments)    Caused nervousness as a child. Patient states he took as a teenager w/o problems Has patient had a PCN reaction causing immediate rash, facial/tongue/throat swelling, SOB or lightheadedness with hypotension: No Has patient had a PCN reaction causing severe rash involving mucus membranes or skin necrosis: No Has patient had a PCN reaction that required hospitalization: No Has patient had a PCN reaction occurring within the last 10 years: No If all of the above answers are "NO", then may proceed with    VITALS:  Blood pressure 117/70, pulse (!) 102, temperature 98.3 F (36.8 C), temperature source Oral, resp. rate 19, height 5\' 11"  (1.803 m), weight 84.8 kg (187 lb), SpO2 96 %.  PHYSICAL EXAMINATION:   Constitutional: Appears well-developed and well-nourished. No distress. HENT: Normocephalic. Marland Kitchen Oropharynx is clear and moist.  Eyes: Conjunctivae and EOM are normal. PERRLA, no scleral icterus.  Neck: Normal ROM. Neck supple. No JVD. No tracheal deviation. CVS: RRR, S1/S2 +, no murmurs, no gallops, no carotid bruit.  Pulmonary: Effort and breath sounds normal, no stridor, rhonchi, wheezes, rales.  Abdominal: Soft. BS +,  no distension, tenderness, rebound or guarding.  Musculoskeletal: honeycomb dressing placed. No edema and no tenderness. +tremor Neuro: Alert. CN 2-12 grossly intact. No focal deficits. Skin: Skin is warm and dry. No rash noted. Psychiatric: some confusion noted this am.      LABORATORY PANEL:   CBC Recent Labs  Lab 05/05/18 0546  WBC 2.9*  HGB 7.6*  HCT 22.0*  PLT 46*   ------------------------------------------------------------------------------------------------------------------  Chemistries  Recent Labs  Lab 05/05/18 0546  NA 135  K 3.3*  CL 106  CO2 23  GLUCOSE 105*  BUN 13  CREATININE 0.84  CALCIUM 7.8*  MG 1.7   ------------------------------------------------------------------------------------------------------------------  Cardiac Enzymes No results for input(s): TROPONINI in the last 168 hours. ------------------------------------------------------------------------------------------------------------------  RADIOLOGY:  No results found.   ASSESSMENT AND PLAN:    83 year old male with history of bladder cancer status post chemotherapy who presented after mechanical fall and has suffered a right hip intertrochanteric fracture.  1.  Thrombocytopenia and recent chemotherapy.  Vincent Poole is following status post platelet transfusion but platelet count dropped again today 55,000-46,000-  2.  History of bladder cancer with chronic Foley catheter that was replaced 2 weeks ago. Patient will follow-up with Vincent Poole in 2  weeks. Chemotherapy on hold until patient has fully recovered.  3.  BPH: Continue Proscar and Flomax  4.  Anemia acute on chronic from chronic disease, recent chemo and B12 deficiency  Patient received  1 unit PRBC.  Hemoglobin at 7.6 CBC for a.m.  Start B12 supplementation  Follow-up with oncology outpatient.    5. Hypokalemia: This is improved with replacement  6.   Right intertrochanteric femur fracture: POD #2 INTRAMEDULLARY (IM) NAIL INTERTROCHANTRIC Continue pain management Ortho is recommending  Lovenox 2 weeks postop but patient is thrombocytopenic with platelet count at 46,000, will hold off on Lovenox heparin/ Staples will need to be removed 2 weeks postop followed by the application of benzoin and Steri-Strips   7.  Leukopenia due to chemotherapy: Follow-up on [place]  Disposition SNF once patient is medically stable  Management plans discussed with the family and they are in agreement CODE STATUS: FULL  TOTAL TIME TAKING CARE OF THIS PATIENT: 33 minutes.     POSSIBLE D/C 2 days, DEPENDING ON CLINICAL CONDITION.   Vincent Poole M.D on 05/05/2018 at 2:14 PM  Between 7am to 6pm - Pager - [phone] After 6pm go to [email or web address] - password [email or web address] [facility]  Office  [phone]  CC: Primary care physician; Vincent Body, MD  Note: This dictation was prepared with Dragon dictation along with smaller phrase technology. Any transcriptional errors that result from this process are unintentional.

## 2018-05-05 NOTE — Plan of Care (Signed)
  Problem: Education: Goal: Knowledge of General Education information will improve Outcome: Progressing   Problem: Health Behavior/Discharge Planning: Goal: Ability to manage health-related needs will improve Outcome: Progressing   Problem: Clinical Measurements: Goal: Ability to maintain clinical measurements within normal limits will improve Outcome: Progressing Goal: Will remain free from infection Outcome: Progressing Goal: Diagnostic test results will improve Outcome: Progressing Goal: Respiratory complications will improve Outcome: Progressing Goal: Cardiovascular complication will be avoided Outcome: Progressing   Problem: Activity: Goal: Risk for activity intolerance will decrease Outcome: Progressing   Problem: Nutrition: Goal: Adequate nutrition will be maintained Outcome: Progressing   Problem: Coping: Goal: Level of anxiety will decrease Outcome: Progressing   

## 2018-05-05 NOTE — Evaluation (Signed)
Occupational Therapy Evaluation Patient Details Name: Vincent Poole MRN: [id number] DOB: 06-Nov-1935 Today's Date: 05/05/2018    History of Present Illness Pt. is an 83 y.o. male with a known history of anxiety, bladder cancer currently undergoing chemo, essential hypertension who is presenting after a fall and suffered R femur fx.  s/p ORIF 6/26.   Clinical Impression   Pt. presents with weakness, limited activity tolerance, BUE tremors, and limited functional mobility which limits his ability to complete basic ADL functioning, and IADLs. Pt. resides at home with his wife. Pt. Was independent with ADLs. Pt. required assist with meal preparation, medication management, and transportation from his wife. Pt. education was provided about A/E use for LE ADL tasks. Pt. Could benefit from OT services for ADL training, A/E training, and pt. Education about home modification, and DME. Pt. would benefitt from SNF level of care upon discharge. Pt. Could benefit from follow-up OT services at discharge. Pt. Plans to go to [facility].     Follow Up Recommendations  SNF    Equipment Recommendations       Recommendations for Other Services       Precautions / Restrictions Precautions Precautions: Fall Restrictions Weight Bearing Restrictions: Yes RLE Weight Bearing: Weight bearing as tolerated      Mobility Bed Mobility  Transfers                Balance Overall balance assessment: Needs assistance Sitting-balance support: Bilateral upper extremity supported Sitting balance-Leahy Scale: Fair                                     ADL either performed or assessed with clinical judgement   ADL   Eating/Feeding: Set up   Grooming: Minimal assistance   Upper Body Bathing: Minimal assistance   Lower Body Bathing: Maximal assistance   Upper Body Dressing : Minimal assistance   Lower Body Dressing: Maximal assistance                 General ADL  Comments: Pt, education was provided about A/E use for LE ADLs.     Vision Baseline Vision/History: No visual deficits Patient Visual Report: No change from baseline       Perception     Praxis      Pertinent Vitals/Pain Pain Assessment: Faces Pain Score: 2  Faces Pain Scale: Hurts a little bit Pain Location: Right Hip Pain Intervention(s): Limited activity within patient's tolerance;Patient requesting pain meds-RN notified     Hand Dominance Right   Extremity/Trunk Assessment Upper Extremity Assessment Upper Extremity Assessment: Generalized weakness(Limited ROM in the right shoulder)           Communication Communication Communication: HOH   Cognition Arousal/Alertness: Awake/alert Behavior During Therapy: Anxious Overall Cognitive Status: Within Functional Limits for tasks assessed                                 General Comments: Follows directions consistently.   General Comments       Exercises   Shoulder Instructions      Home Living Family/patient expects to be discharged to:: Skilled nursing facility Living Arrangements: Spouse/significant other Available Help at Discharge: Family;Available 24 hours/day Type of Home: Mobile home Home Access: Stairs to enter Entrance Stairs-Number of Steps: 2 Entrance Stairs-Rails: Left Home Layout: One level     Bathroom  Shower/Tub: Curtain;Tub/shower unit   Biochemist, clinical: Standard     Home Equipment: Environmental consultant - 2 wheels;Cane - single point          Prior Functioning/Environment Level of Independence: Independent with assistive device(s)        Comments: Pt.'s wife assists with meal preparation, medication management, and transportation.        OT Problem List: Decreased strength;Decreased activity tolerance;Decreased cognition;Impaired UE functional use;Pain;Decreased range of motion      OT Treatment/Interventions: Self-care/ADL training;Neuromuscular education;Patient/family  education;Therapeutic exercise    OT Goals(Current goals can be found in the care plan section) Acute Rehab OT Goals Patient Stated Goal: To go home  OT Frequency: Min 2X/week   Barriers to D/C:            Co-evaluation              AM-PAC PT "6 Clicks" Daily Activity     Outcome Measure Help from another person eating meals?: None Help from another person taking care of personal grooming?: A Little Help from another person toileting, which includes using toliet, bedpan, or urinal?: A Lot Help from another person bathing (including washing, rinsing, drying)?: A Lot Help from another person to put on and taking off regular upper body clothing?: A Little Help from another person to put on and taking off regular lower body clothing?: A Lot 6 Click Score: 16   End of Session Equipment Utilized During Treatment: Gait belt  Activity Tolerance: Patient tolerated treatment well Patient left: in bed  OT Visit Diagnosis: Unsteadiness on feet (R26.81)                Time: 5035-4656 OT Time Calculation (min): 23 min Charges:  OT General Charges $OT Visit: 1 Visit OT Evaluation $OT Eval Moderate Complexity: 1 Mod G-Codes:    Harrel Carina, MS, OTR/L Harrel Carina 05/05/2018, 11:46 AM

## 2018-05-05 NOTE — Progress Notes (Signed)
Physical Therapy Treatment Patient Details Name: Vincent Poole MRN: [id number] DOB: November 05, 1935 Today's Date: 05/05/2018    History of Present Illness 83 y.o. male with a known history of anxiety, bladder cancer currently undergoing chemo, essential hypertension who is presenting after a fall and suffered R femur fx.  s/p ORIF 6/26.    PT Comments    Pt demonstrated progress today with bed mobility.  He is very weak and becomes tremulous with 4-5 min of sitting at EOB.  Pt requires min-mod A to come to sitting at EOB and to return to supine but is able to scoot up in bed without PT or UE assist.  Pt performed there ex sitting at EOB as well as supine and required VC's and manual assist for form and to initiate movement with some exercises.  PT provided education concerning importance of frequent activity and recovery. Pt expressed understanding.  He reported that he is not sure if his mobility limitations are due to pain or fatigue at this time and that his R hip "hurts a little bit".  Pt is very apprehensive and requires frequent encouragement throughout treatment.  Pt will continue to benefit from skilled PT with focus on strength, tolerance to activity, functional mobility and pain management.   Follow Up Recommendations  SNF     Equipment Recommendations  None recommended by PT    Recommendations for Other Services       Precautions / Restrictions Restrictions Weight Bearing Restrictions: Yes RLE Weight Bearing: Weight bearing as tolerated    Mobility  Bed Mobility Overal bed mobility: Needs Assistance Bed Mobility: Sit to Supine;Supine to Sit     Supine to sit: Mod assist;+2 for physical assistance Sit to supine: Mod assist;+2 for physical assistance   General bed mobility comments: Provided assistance to bring LE's over EOB and sit up.  Can balance at EOB without assistance.  Transfers Overall transfer level: (Did not perform due to pt becoming very tremulous and  fatigued following sitting for 5 min.)                  Ambulation/Gait Ambulation/Gait assistance: (Did not perform.)               Stairs             Wheelchair Mobility    Modified Rankin (Stroke Patients Only)       Balance Overall balance assessment: Needs assistance Sitting-balance support: Bilateral upper extremity supported Sitting balance-Leahy Scale: Fair                                      Cognition Arousal/Alertness: Awake/alert Behavior During Therapy: Anxious Overall Cognitive Status: Within Functional Limits for tasks assessed                                 General Comments: Follows directions consistently.      Exercises General Exercises - Lower Extremity Ankle Circles/Pumps: AROM;10 reps;Seated Short Arc Quad: AROM;10 reps;Supine;Right Long Arc Quad: Strengthening;Right;10 reps;Seated(Partial ROM) Hip ABduction/ADduction: 10 reps;AAROM;Supine;Right Straight Leg Raises: AAROM;Right;5 reps;Supine Other Exercises Other Exercises: Educated concerning the importance of frequency of exercise related to recovery x4 Other Exercises: Educated pt concerning bed mobility and body mechanics x3 Other Exercises: Pt able to scoot himself up in bed with bed HOB flat performing partial unilateral bridge without UE  assist.    General Comments        Pertinent Vitals/Pain Pain Assessment: Faces Faces Pain Scale: Hurts a little bit Pain Location: States that R hip hurts a little bit and that he is not sure whether he is "more tired or in more pain". Pain Intervention(s): Limited activity within patient's tolerance;Monitored during session    Home Living                      Prior Function            PT Goals (current goals can now be found in the care plan section) Acute Rehab PT Goals Patient Stated Goal: To go home PT Goal Formulation: With patient Time For Goal Achievement: 06/02/18 Potential  to Achieve Goals: Fair Progress towards PT goals: Progressing toward goals    Frequency    BID      PT Plan Current plan remains appropriate    Co-evaluation              AM-PAC PT "6 Clicks" Daily Activity  Outcome Measure  Difficulty turning over in bed (including adjusting bedclothes, sheets and blankets)?: A Lot Difficulty moving from lying on back to sitting on the side of the bed? : A Lot Difficulty sitting down on and standing up from a chair with arms (e.g., wheelchair, bedside commode, etc,.)?: A Lot Help needed moving to and from a bed to chair (including a wheelchair)?: A Lot Help needed walking in hospital room?: A Lot Help needed climbing 3-5 steps with a railing? : Total 6 Click Score: 11    End of Session   Activity Tolerance: Patient limited by pain;Patient limited by fatigue Patient left: with bed alarm set;with call bell/phone within reach;with family/visitor present Nurse Communication: Mobility status PT Visit Diagnosis: Muscle weakness (generalized) (M62.81);Difficulty in walking, not elsewhere classified (R26.2)     Time: 1000-1035 PT Time Calculation (min) (ACUTE ONLY): 35 min  Charges:  $Therapeutic Exercise: 8-22 mins $Therapeutic Activity: 8-22 mins                    G Codes:  Functional Assessment Tool Used: AM-PAC 6 Clicks Basic Mobility    Roxanne Gates, PT, DPT    Roxanne Gates 05/05/2018, 11:17 AM

## 2018-05-05 NOTE — Progress Notes (Signed)
Physical Therapy Treatment Patient Details Name: Vincent Poole MRN: [id number] DOB: December 30, 1934 Today's Date: 05/05/2018    History of Present Illness 83 y.o. male with a known history of anxiety, bladder cancer currently undergoing chemo, essential hypertension who is presenting after a fall and suffered R femur fx.  s/p ORIF 6/26.    PT Comments    Patient demonstrated progress with participation of there ex this afternoon.  Pt makes statements such as, "I don't know if I can do it. You will have to help me." and is then able to perform the exercise.  After education, pt was able to complete sets of SAQ's and supine hip abduction without manual assist from PT.  Pt reported pain level of 2/10 during treatment after having received pain medication from RN.  He requested to stay in bed due to fatigue and presented with tremors of the UE throughout treatment.  PT directed pt in deep breathing exercises and advised pt to use this throughout HEP and when he feels anxious.  This allowed for a decrease in tremor activity.   Pt's HR was slightly tachycardic (109 BPM) and returned to <100 at conclusion of treatment. Pt will continue to benefit from skilled PT with focus on strength, functional mobility, therapeutic exercise, safe use of RW and pain management.  Follow Up Recommendations  SNF     Equipment Recommendations  None recommended by PT    Recommendations for Other Services       Precautions / Restrictions Precautions Precautions: Fall Restrictions Weight Bearing Restrictions: Yes RLE Weight Bearing: Weight bearing as tolerated    Mobility  Bed Mobility Overal bed mobility: (Did not perform bed mobility due to pt request to perform supine exercises.  States that he is very fatigued this afternoon.) Bed Mobility: Sit to Supine;Supine to Sit     Supine to sit: Mod assist;+2 for physical assistance Sit to supine: Mod assist;+2 for physical assistance   General bed mobility  comments: Provided assistance to bring LE's over EOB and sit up.  Can balance at EOB without assistance.  Transfers Overall transfer level: (Did not perform due to pt becoming very tremulous and fatigued following sitting for 5 min.)                  Ambulation/Gait Ambulation/Gait assistance: (Did not perform.)               Stairs             Wheelchair Mobility    Modified Rankin (Stroke Patients Only)       Balance Overall balance assessment: Needs assistance Sitting-balance support: Bilateral upper extremity supported Sitting balance-Leahy Scale: Fair                                      Cognition Arousal/Alertness: Awake/alert Behavior During Therapy: Anxious Overall Cognitive Status: Within Functional Limits for tasks assessed                                 General Comments: Follows directions consistently.      Exercises General Exercises - Lower Extremity Ankle Circles/Pumps: AROM;20 reps;Supine;Strengthening Short Arc Quad: Strengthening;Right;10 reps;Supine(Able to perform without manual assistance.) Long Arc Quad: Strengthening;Right;10 reps;Seated(Partial ROM) Heel Slides: AAROM;Right;10 reps;Supine Hip ABduction/ADduction: AAROM;10 reps;Supine;Right(Pillow squeezes x10, hip abd x10 with manual assist to clear bed but  pt was able to actively initiate and complete set.) Straight Leg Raises: AAROM;Right;5 reps;Supine Other Exercises Other Exercises: Issued HEP and discussed management of exercise schedule. x2 min Other Exercises: Directed pt through deep breathing exercises to manage anxiety and HR.  Pt demonstrated understanding. x5 min Other Exercises: Pt able to scoot himself up in bed with bed HOB flat performing partial unilateral bridge without UE assist.    General Comments        Pertinent Vitals/Pain Pain Assessment: 0-10 Pain Score: 2  Faces Pain Scale: Hurts a little bit Pain Location: R  hip Pain Intervention(s): RN gave pain meds during session;Monitored during session    [place] expects to be discharged to:: Skilled nursing facility Living Arrangements: Spouse/significant other Available Help at Discharge: Family;Available 24 hours/day Type of Home: Mobile home Home Access: Stairs to enter Entrance Stairs-Rails: Left Home Layout: One level Home Equipment: Walker - 2 wheels;Cane - single point      Prior Function Level of Independence: Independent with assistive device(s)      Comments: Pt.'s wife assists with meal preparation, medication management, and transportation.   PT Goals (current goals can now be found in the care plan section) Acute Rehab PT Goals Patient Stated Goal: To go home PT Goal Formulation: With patient Time For Goal Achievement: 06/02/18 Potential to Achieve Goals: Fair Progress towards PT goals: Progressing toward goals    Frequency    BID      PT Plan Current plan remains appropriate    Co-evaluation              AM-PAC PT "6 Clicks" Daily Activity  Outcome Measure  Difficulty turning over in bed (including adjusting bedclothes, sheets and blankets)?: A Lot Difficulty moving from lying on back to sitting on the side of the bed? : A Lot Difficulty sitting down on and standing up from a chair with arms (e.g., wheelchair, bedside commode, etc,.)?: A Lot Help needed moving to and from a bed to chair (including a wheelchair)?: A Lot Help needed walking in hospital room?: A Lot Help needed climbing 3-5 steps with a railing? : Total 6 Click Score: 11    End of Session   Activity Tolerance: Patient limited by fatigue Patient left: with bed alarm set;with call bell/phone within reach;with family/visitor present;in bed Nurse Communication: Patient requests pain meds PT Visit Diagnosis: Muscle weakness (generalized) (M62.81);Difficulty in walking, not elsewhere classified (R26.2)     Time: 1422-1450 PT  Time Calculation (min) (ACUTE ONLY): 28 min  Charges:  $Therapeutic Exercise: 8-22 mins $Therapeutic Activity: 8-22 mins                    G Codes:  Functional Assessment Tool Used: AM-PAC 6 Clicks Basic Mobility    Roxanne Gates, PT, DPT    Roxanne Gates 05/05/2018, 2:53 PM

## 2018-05-05 NOTE — Progress Notes (Signed)
Pharmacy Electrolyte Monitoring Consult:  Pharmacy consulted to assist in monitoring and replacing electrolytes in this 83 y.o. male admitted on 05/01/2018 with Knee Injury   Labs:  Sodium (mmol/L)  Date Value  05/05/2018 135   Potassium (mmol/L)  Date Value  05/05/2018 3.3 (L)   Magnesium (mg/dL)  Date Value  05/05/2018 1.7   Phosphorus (mg/dL)  Date Value  05/30/2017 3.3   Calcium (mg/dL)  Date Value  05/05/2018 7.8 (L)   Albumin (g/dL)  Date Value  04/26/2018 2.9 (L)    Assessment/Plan: K 3.3, Mag 1.7   Scr 0.84 Current order for KCL PO 20 meq bid x 1 day. Will order Magnesium 1 gram IV x 1. F/u with am labs.  Vincent Poole A 05/05/2018 9:08 AM

## 2018-05-05 NOTE — Progress Notes (Addendum)
   Subjective: 3 Days Post-Op Procedure(s) (LRB): INTRAMEDULLARY (IM) NAIL INTERTROCHANTRIC (Right) Patient reports pain as moderate.   Patient is well, and has had no acute complaints or problems Continue with physical therapy.  No ambulation yet according to PT. Plan is to go Rehab after hospital stay. no nausea and no vomiting Patient denies any chest pains or shortness of breath. Patient states that he is feeling better but still having pain to the right thigh. Objective: Vital signs in last 24 hours: Temp:  [97.5 F (36.4 C)-98.3 F (36.8 C)] 98.3 F (36.8 C) (06/29 0801) Pulse Rate:  [101-120] 102 (06/29 0801) Resp:  [18-19] 19 (06/28 2347) BP: (116-125)/(57-70) 117/70 (06/29 0801) SpO2:  [93 %-96 %] 96 % (06/29 0801) well approximated incision Heels are non tender and elevated off the bed using rolled towels Intake/Output from previous day: 06/28 0701 - 06/29 0700 In: 530 [P.O.:220; Blood:310] Out: 1775 [Urine:1775] Intake/Output this shift: No intake/output data recorded.  Recent Labs    05/03/18 0446 05/03/18 1936 05/04/18 0704 05/04/18 2127 05/05/18 0546  HGB 6.4* 7.7* 6.8* 7.5* 7.6*   Recent Labs    05/04/18 0704 05/04/18 2127 05/05/18 0546  WBC 3.1*  --  2.9*  RBC 2.17*  --  2.43*  HCT 19.6* 22.1* 22.0*  PLT 55*  --  46*   Recent Labs    05/04/18 0704 05/05/18 0546  NA 135 135  K 3.4* 3.3*  CL 106 106  CO2 22 23  BUN 13 13  CREATININE 0.81 0.84  GLUCOSE 110* 105*  CALCIUM 7.9* 7.8*   No results for input(s): LABPT, INR in the last 72 hours.  EXAM General - Patient is Alert, Appropriate and Oriented Extremity - Neurologically intact Neurovascular intact Sensation intact distally Intact pulses distally Dorsiflexion/Plantar flexion intact No cellulitis present Compartment soft Dressing - dressing C/D/I Motor Function - intact, moving foot and toes well on exam.   Still has some tightness to the right thigh but no evidence of any  compartment syndrome  Past Medical History:  Diagnosis Date  . Anxiety   . Arthritis   . Bladder cancer ([place])   . Hypertension   . Prostate cancer ([place])   . Urinary retention 2019   foley catheter place 11/2017  . UTI (urinary tract infection) 2019   frequent UTI's over last year    Assessment/Plan: 3 Days Post-Op Procedure(s) (LRB): INTRAMEDULLARY (IM) NAIL INTERTROCHANTRIC (Right) Active Problems:   Hip fracture ([facility])  Estimated body mass index is 26.08 kg/m as calculated from the following:   Height as of this encounter: 5\' 11"  (1.803 m).   Weight as of this encounter: 84.8 kg (187 lb). Up with therapy Discharge to SNF when medically cleared  Labs: Hemoglobin 7.6 DVT Prophylaxis - Lovenox, Foot Pumps and SCD Weight-Bearing as tolerated to right leg Klor-Con ordered Follow-up [facility] 2 weeks Continued Lovenox 2 weeks postop Staples will need to be removed 2 weeks postop followed by the application of benzoin and Steri-Strips    Jon R. Wilson Catano 05/05/2018, 8:03 AM

## 2018-05-06 LAB — CBC
HCT: 23.9 % — ABNORMAL LOW (ref 40.0–52.0)
Hemoglobin: 8 g/dL — ABNORMAL LOW (ref 13.0–18.0)
MCH: 31 pg (ref 26.0–34.0)
MCHC: 33.5 g/dL (ref 32.0–36.0)
MCV: 92.7 fL (ref 80.0–100.0)
PLATELETS: 66 10*3/uL — AB (ref 150–440)
RBC: 2.58 MIL/uL — ABNORMAL LOW (ref 4.40–5.90)
RDW: 17 % — AB (ref 11.5–14.5)
WBC: 3.7 10*3/uL — AB (ref 3.8–10.6)

## 2018-05-06 LAB — BASIC METABOLIC PANEL
ANION GAP: 7 (ref 5–15)
BUN: 13 mg/dL (ref 8–23)
CALCIUM: 8.2 mg/dL — AB (ref 8.9–10.3)
CO2: 25 mmol/L (ref 22–32)
Chloride: 104 mmol/L (ref 98–111)
Creatinine, Ser: 0.77 mg/dL (ref 0.61–1.24)
Glucose, Bld: 115 mg/dL — ABNORMAL HIGH (ref 70–99)
Potassium: 3.7 mmol/L (ref 3.5–5.1)
SODIUM: 136 mmol/L (ref 135–145)

## 2018-05-06 MED ORDER — DOCUSATE SODIUM 100 MG PO CAPS: 100.0000 mg | ORAL_CAPSULE | Freq: Two times a day (BID) | ORAL | 0 refills | Status: DC

## 2018-05-06 MED ORDER — ONDANSETRON HCL 4 MG PO TABS: 4.0000 mg | ORAL_TABLET | Freq: Four times a day (QID) | ORAL | 0 refills | Status: DC | PRN

## 2018-05-06 MED ORDER — METOCLOPRAMIDE HCL 5 MG PO TABS: 5.0000 mg | ORAL_TABLET | Freq: Three times a day (TID) | ORAL | Status: DC | PRN

## 2018-05-06 MED ORDER — PANTOPRAZOLE SODIUM 40 MG PO TBEC: 40.0000 mg | DELAYED_RELEASE_TABLET | Freq: Every day | ORAL | Status: DC

## 2018-05-06 MED ORDER — OXYCODONE HCL 5 MG PO TABS: 5.0000 mg | ORAL_TABLET | ORAL | 0 refills | Status: DC | PRN

## 2018-05-06 MED ORDER — CYANOCOBALAMIN 1000 MCG PO TABS: 1000.0000 ug | ORAL_TABLET | Freq: Every day | ORAL | Status: DC

## 2018-05-06 MED ORDER — FERROUS SULFATE 325 (65 FE) MG PO TABS: 325.0000 mg | ORAL_TABLET | Freq: Two times a day (BID) | ORAL | 3 refills | Status: AC

## 2018-05-06 MED ORDER — ACETAMINOPHEN 325 MG PO TABS: 650.0000 mg | ORAL_TABLET | Freq: Four times a day (QID) | ORAL | Status: DC | PRN

## 2018-05-06 MED ORDER — MAGNESIUM HYDROXIDE 400 MG/5ML PO SUSP: 30.0000 mL | Freq: Every day | ORAL | 0 refills | Status: DC | PRN

## 2018-05-06 NOTE — Clinical Social Work Note (Addendum)
The patient will discharge today to [facility]. The CSW will deliver the packet once room and report have been received and the discharge summary is available.   UPDATE: The discharge summary has been sent. The CSW is waiting for room and report numbers before delivering the discharge packet. Once the packet has been delivered, the CSW will sign off. Please consult should additional needs arise.  Santiago Bumpers, MSW, Latanya Presser [phone]

## 2018-05-06 NOTE — Progress Notes (Signed)
Pharmacy Electrolyte Monitoring Consult:  Pharmacy consulted to assist in monitoring and replacing electrolytes in this 83 y.o. male admitted on 05/01/2018 with Knee Injury   Labs:  Sodium (mmol/L)  Date Value  05/06/2018 136   Potassium (mmol/L)  Date Value  05/06/2018 3.7   Magnesium (mg/dL)  Date Value  05/05/2018 1.7   Phosphorus (mg/dL)  Date Value  05/30/2017 3.3   Calcium (mg/dL)  Date Value  05/06/2018 8.2 (L)   Albumin (g/dL)  Date Value  04/26/2018 2.9 (L)    Assessment/Plan: K 3.7,   Scr 0.77 No supplementation at this time. F/u with am labs.  Othelia Riederer A 05/06/2018 10:15 AM

## 2018-05-06 NOTE — Progress Notes (Signed)
IV removed, right chest port deaccessed. [facility] plans to [facility]. D/C order in. Patient and wife updated on plan of care.

## 2018-05-06 NOTE — Progress Notes (Signed)
Pt alert and oriented. No complaints of pain over night. Surgical dressing dry and intact. Chronic Foley patent and draining urine.

## 2018-05-06 NOTE — Discharge Instructions (Signed)
Follow-up with primary care physician in 2 to 3 days please monitor CBC Follow-up with orthopedics in 2 weeks for staple removal and follow-up visit Follow-up with oncology Dr. [name] as recommended or in a  week Follow-up with urology Dr. Erlene Quan in 1 to 2 weeks

## 2018-05-06 NOTE — Discharge Summary (Signed)
[facility] NAME: Vincent Poole    MR#:  [id number]  DATE OF BIRTH:  [birth year]  DATE OF ADMISSION:  05/01/2018 ADMITTING PHYSICIAN: Dustin Flock, MD  DATE OF DISCHARGE:  05/06/18  PRIMARY CARE PHYSICIAN: [name], MD    ADMISSION DIAGNOSIS:  Fall [W19.XXXA] Closed fracture of right hip, initial encounter ([place]) [S72.001A]  DISCHARGE DIAGNOSIS:  Active Problems:   Hip fracture (HCC)  Thrombocytopenia   SECONDARY DIAGNOSIS:   Past Medical History:  Diagnosis Date  . Anxiety   . Arthritis   . Bladder cancer ([place])   . Hypertension   . Prostate cancer ([place])   . Urinary retention 2019   foley catheter place 11/2017  . UTI (urinary tract infection) 2019   frequent UTI's over last year    HOSPITAL COURSE:    1.  Thrombocytopenia and recent chemotherapy.  Dr. [name] is following status post platelet transfusion but platelet count dropped again today 55,000-46,000-66,000  2.  History of bladder cancer with chronic Foley catheter that was replaced 2 weeks ago. Patient will follow-up with Dr. Erlene Quan in 2 weeks. Chemotherapy on hold until patient has fully recovered.  Out patient follow-up with oncology as recommended  3.  BPH: Continue Proscar and Flomax  4. Anemia acute on chronic from chronic disease, recent chemo and B12 deficiency  Patient received  1 unit PRBC.  Hemoglobin at 7.6-8.0  Start B12 supplementation  Follow-up with oncology outpatient.    5. Hypokalemia: This is improved with replacement  6.   Right intertrochanteric femur fracture: POD #4 INTRAMEDULLARY (IM) NAIL INTERTROCHANTRIC Continue pain management  patient is thrombocytopenic with platelet count at 66,000, will hold off on Lovenox heparin/discussed with orthopedics agreeable Staples will need to be removed 2 weeks postop followed by the application of benzoin and Steri-Strips   7.  Leukopenia due to chemotherapy-better  today with WBC of 3.7  Disposition SNF [facility].  Patient and wife at bedside are agreeable  DISCHARGE CONDITIONS:    fair  CONSULTS OBTAINED:  Treatment Team:  [name], MD   PROCEDURES  INTRAMEDULLARY (IM) NAIL INTERTROCHANTRIC (Right)     DRUG ALLERGIES:   Allergies  Allergen Reactions  . Penicillins Other (See Comments)    Caused nervousness as a child. Patient states he took as a teenager w/o problems Has patient had a PCN reaction causing immediate rash, facial/tongue/throat swelling, SOB or lightheadedness with hypotension: No Has patient had a PCN reaction causing severe rash involving mucus membranes or skin necrosis: No Has patient had a PCN reaction that required hospitalization: No Has patient had a PCN reaction occurring within the last 10 years: No If all of the above answers are "NO", then may proceed with    DISCHARGE MEDICATIONS:   Allergies as of 05/06/2018      Reactions   Penicillins Other (See Comments)   Caused nervousness as a child. Patient states he took as a teenager w/o problems Has patient had a PCN reaction causing immediate rash, facial/tongue/throat swelling, SOB or lightheadedness with hypotension: No Has patient had a PCN reaction causing severe rash involving mucus membranes or skin necrosis: No Has patient had a PCN reaction that required hospitalization: No Has patient had a PCN reaction occurring within the last 10 years: No If all of the above answers are "NO", then may proceed with      Medication List    STOP taking these medications   dexamethasone 4  MG tablet Commonly known as:  DECADRON   HYDROcodone-acetaminophen 5-325 MG tablet Commonly known as:  NORCO/VICODIN   lidocaine-prilocaine cream Commonly known as:  EMLA   LORazepam 0.5 MG tablet Commonly known as:  ATIVAN   nystatin powder Commonly known as:  MYCOSTATIN/NYSTOP   oxybutynin 5 MG tablet Commonly known as:  DITROPAN   prochlorperazine  10 MG tablet Commonly known as:  COMPAZINE   senna-docusate 8.6-50 MG tablet Commonly known as:  Senokot-S     TAKE these medications   acetaminophen 325 MG tablet Commonly known as:  TYLENOL Take 2 tablets (650 mg total) by mouth every 6 (six) hours as needed for mild pain (pain score 1-3 or temp > 100.5). What changed:    medication strength  how much to take  when to take this  reasons to take this   cetirizine 10 MG tablet Commonly known as:  ZYRTEC Take 10 mg by mouth daily.   CRANBERRY PO Take 1 tablet by mouth 2 (two) times daily.   cyanocobalamin 1000 MCG tablet Take 1 tablet (1,000 mcg total) by mouth daily. Start taking on:  05/07/2018   docusate sodium 100 MG capsule Commonly known as:  COLACE Take 1 capsule (100 mg total) by mouth 2 (two) times daily.   ferrous sulfate 325 (65 FE) MG tablet Take 1 tablet (325 mg total) by mouth 2 (two) times daily with a meal.   finasteride 5 MG tablet Commonly known as:  PROSCAR Take 1 tablet (5 mg total) by mouth daily.   magnesium hydroxide 400 MG/5ML suspension Commonly known as:  MILK OF MAGNESIA Take 30 mLs by mouth daily as needed for mild constipation.   metoCLOPramide 5 MG tablet Commonly known as:  REGLAN Take 1-2 tablets (5-10 mg total) by mouth every 8 (eight) hours as needed for nausea (if ondansetron (ZOFRAN) ineffective.).   mirabegron ER 50 MG Tb24 tablet Commonly known as:  MYRBETRIQ Take 1 tablet (50 mg total) by mouth daily.   ondansetron 4 MG tablet Commonly known as:  ZOFRAN Take 1 tablet (4 mg total) by mouth every 6 (six) hours as needed for nausea. What changed:    medication strength  See the new instructions.   oxyCODONE 5 MG immediate release tablet Commonly known as:  Oxy IR/ROXICODONE Take 1-2 tablets (5-10 mg total) by mouth every 4 (four) hours as needed for moderate pain (pain score 4-6).   pantoprazole 40 MG tablet Commonly known as:  PROTONIX Take 1 tablet (40 mg total)  by mouth daily.   polyethylene glycol powder powder Commonly known as:  MIRALAX Take 17 g by mouth daily as needed. Can increase to 3 times a day as needed for constipation but hold medication if has diarrhea   tamsulosin 0.4 MG Caps capsule Commonly known as:  FLOMAX Take 1 capsule (0.4 mg total) by mouth 2 (two) times daily.        DISCHARGE INSTRUCTIONS:   Follow-up with primary care physician in 2 to 3 days please monitor CBC Follow-up with orthopedics in 2 weeks for staple removal and follow-up visit Follow-up with oncology Dr. [name] as recommended or in a  week Follow-up with urology Dr. Erlene Quan in 1 to 2 weeks  DIET:  Cardiac diet  DISCHARGE CONDITION:  Fair  ACTIVITY:  Activity as tolerated per PT  OXYGEN:  Home Oxygen: No.   Oxygen Delivery: room air  DISCHARGE LOCATION:  nursing home   If you experience worsening of your admission symptoms, develop  shortness of breath, life threatening emergency, suicidal or homicidal thoughts you must seek medical attention immediately by calling 911 or calling your MD immediately  if symptoms less severe.  You Must read complete instructions/literature along with all the possible adverse reactions/side effects for all the Medicines you take and that have been prescribed to you. Take any new Medicines after you have completely understood and accpet all the possible adverse reactions/side effects.   Please note  You were cared for by a hospitalist during your hospital stay. If you have any questions about your discharge medications or the care you received while you were in the hospital after you are discharged, you can call the unit and asked to speak with the hospitalist on call if the hospitalist that took care of you is not available. Once you are discharged, your primary care physician will handle any further medical issues. Please note that NO REFILLS for any discharge medications will be authorized once you are discharged,  as it is imperative that you return to your primary care physician (or establish a relationship with a primary care physician if you do not have one) for your aftercare needs so that they can reassess your need for medications and monitor your lab values.     Today  Chief Complaint  Patient presents with  . Knee Injury   Pt is feeling ok ,ok to discharge  ROS:  CONSTITUTIONAL: Denies fevers, chills. Denies any fatigue, weakness.  EYES: Denies blurry vision, double vision, eye pain. EARS, NOSE, THROAT: Denies tinnitus, ear pain, hearing loss. RESPIRATORY: Denies cough, wheeze, shortness of breath.  CARDIOVASCULAR: Denies chest pain, palpitations, edema.  GASTROINTESTINAL: Denies nausea, vomiting, diarrhea, abdominal pain. Denies bright red blood per rectum. GENITOURINARY: Denies dysuria, hematuria. ENDOCRINE: Denies nocturia or thyroid problems. HEMATOLOGIC AND LYMPHATIC: Denies easy bruising or bleeding. SKIN: Denies rash or lesion. MUSCULOSKELETAL: Denies pain in neck, back, shoulder, knees, hips or arthritic symptoms.  NEUROLOGIC: Denies paralysis, paresthesias.  PSYCHIATRIC: Denies anxiety or depressive symptoms.   VITAL SIGNS:  Blood pressure 123/76, pulse (!) 105, temperature 98.4 F (36.9 C), temperature source Axillary, resp. rate 18, height 5\' 11"  (1.803 m), weight 84.8 kg (187 lb), SpO2 96 %.  I/O:    Intake/Output Summary (Last 24 hours) at 05/06/2018 1038 Last data filed at 05/06/2018 1030 Gross per 24 hour  Intake 651.67 ml  Output 1300 ml  Net -648.33 ml    PHYSICAL EXAMINATION:  GENERAL:  83 y.o.-year-old patient lying in the bed with no acute distress.  EYES: Pupils equal, round, reactive to light and accommodation. No scleral icterus. Extraocular muscles intact.  HEENT: Head atraumatic, normocephalic. Oropharynx and nasopharynx clear.  NECK:  Supple, no jugular venous distention. No thyroid enlargement, no tenderness.  LUNGS: Normal breath sounds  bilaterally, no wheezing, rales,rhonchi or crepitation. No use of accessory muscles of respiration.  CARDIOVASCULAR: S1, S2 normal. No murmurs, rubs, or gallops.  ABDOMEN: Soft, non-tender, non-distended. Bowel sounds present. No organomegaly or mass.  EXTREMITIES: Rt hip with clean honeycomb dressing  no pedal edema, cyanosis, or clubbing.  NEUROLOGIC: Cranial nerves II through XII are intact. Muscle strength 5/5 in all extremities. Sensation intact. Gait not checked.  PSYCHIATRIC: The patient is alert and oriented x 3.  SKIN: No obvious rash, lesion, or ulcer.  No bruises  DATA REVIEW:   CBC Recent Labs  Lab 05/06/18 0926  WBC 3.7*  HGB 8.0*  HCT 23.9*  PLT 66*    Chemistries  Recent Labs  Lab  05/05/18 0546 05/06/18 0926  NA 135 136  K 3.3* 3.7  CL 106 104  CO2 23 25  GLUCOSE 105* 115*  BUN 13 13  CREATININE 0.84 0.77  CALCIUM 7.8* 8.2*  MG 1.7  --     Cardiac Enzymes No results for input(s): TROPONINI in the last 168 hours.  Microbiology Results  Results for orders placed or performed during the hospital encounter of 05/01/18  Surgical PCR screen     Status: None   Collection Time: 05/02/18  8:40 AM  Result Value Ref Range Status   MRSA, PCR NEGATIVE NEGATIVE Final   Staphylococcus aureus NEGATIVE NEGATIVE Final    Comment: (NOTE) The Xpert SA Assay (FDA approved for NASAL specimens in patients 82 years of age and older), is one component of a comprehensive surveillance program. It is not intended to diagnose infection nor to guide or monitor treatment. Performed at [facility], [address], [place], [place] [address]     RADIOLOGY:  Dg C-arm 1-60 Min  Result Date: 05/03/2018 CLINICAL DATA:  Intramedullary right nail fixation of the hip. EXAM: DG C-ARM 61-120 MIN; OPERATIVE RIGHT HIP WITH PELVIS COMPARISON:  None. FINDINGS: 1 minutes 30 seconds of fluoroscopic time was utilized. Four intraoperative images were acquired of the right femur  demonstrating a long-stem femoral nail without intraoperative complications noted. IMPRESSION: Fluoroscopic time utilized for placement of a long-stem right femoral nail across an intratrochanteric fracture of the right femur. Fine bony detail is limited by the C-arm fluoroscopic technique. Alignment is near anatomic. Electronically Signed   By: Ashley Royalty M.D.   On: 05/03/2018 03:26   Dg Hip Operative Unilat With Pelvis Right  Result Date: 05/03/2018 CLINICAL DATA:  Intramedullary right nail fixation of the hip. EXAM: DG C-ARM 61-120 MIN; OPERATIVE RIGHT HIP WITH PELVIS COMPARISON:  None. FINDINGS: 1 minutes 30 seconds of fluoroscopic time was utilized. Four intraoperative images were acquired of the right femur demonstrating a long-stem femoral nail without intraoperative complications noted. IMPRESSION: Fluoroscopic time utilized for placement of a long-stem right femoral nail across an intratrochanteric fracture of the right femur. Fine bony detail is limited by the C-arm fluoroscopic technique. Alignment is near anatomic. Electronically Signed   By: Ashley Royalty M.D.   On: 05/03/2018 03:26    EKG:   Orders placed or performed during the hospital encounter of 05/01/18  . ED EKG  . ED EKG      Management plans discussed with the patient, family and they are in agreement.  CODE STATUS:     Code Status Orders  (From admission, onward)        Start     Ordered   05/01/18 1703  Full code  Continuous     05/01/18 1702    Code Status History    Date Active Date Inactive Code Status Order ID Comments User Context   12/26/2017 2141 12/30/2017 1727 Full Code [id number]  Demetrios Loll, MD Inpatient   05/31/2017 0208 06/01/2017 1922 Full Code [id number]  Harvie Bridge, DO Inpatient   03/04/2017 1833 03/05/2017 1426 Full Code [id number]  Idelle Crouch, MD Inpatient   07/11/2016 0538 07/13/2016 1420 Full Code [id number]  Saundra Shelling, MD ED      TOTAL TIME TAKING CARE OF THIS PATIENT: 43   minutes.   Note: This dictation was prepared with Dragon dictation along with smaller phrase technology. Any transcriptional errors that result from this process are unintentional.   @[facility] @  on 05/06/2018 at  10:38 AM  Between 7am to 6pm - Pager - [phone]  After 6pm go to [email or web address] - password [email or web address]  Office  [phone]  CC: Primary care physician; [name], MD

## 2018-05-06 NOTE — Progress Notes (Signed)
   Subjective: 4 Days Post-Op Procedure(s) (LRB): INTRAMEDULLARY (IM) NAIL INTERTROCHANTRIC (Right) Patient reports pain as mild.   Patient is well, and has had no acute complaints or problems Physical therapy slow due to medical condition and fatigue. Patient spirits seem to be a little bit more upward today. Resting well and voicing no complaints Plan is to go Rehab after hospital stay. no nausea and no vomiting Patient denies any chest pains or shortness of breath. Objective: Vital signs in last 24 hours: Temp:  [98 F (36.7 C)-98.5 F (36.9 C)] 98.5 F (36.9 C) (06/29 2321) Pulse Rate:  [102-106] 106 (06/29 2321) Resp:  [18] 18 (06/29 2321) BP: (102-132)/(58-68) 102/58 (06/29 2321) SpO2:  [94 %] 94 % (06/29 2321) well approximated incision Heels are non tender and elevated off the bed using rolled towels Intake/Output from previous day: 06/29 0701 - 06/30 0700 In: 411.7 [P.O.:240; I.V.:171.7] Out: 1000 [Urine:1000] Intake/Output this shift: Total I/O In: 240 [P.O.:240] Out: -   Recent Labs    05/03/18 1936 05/04/18 0704 05/04/18 2127 05/05/18 0546  HGB 7.7* 6.8* 7.5* 7.6*   Recent Labs    05/04/18 0704 05/04/18 2127 05/05/18 0546  WBC 3.1*  --  2.9*  RBC 2.17*  --  2.43*  HCT 19.6* 22.1* 22.0*  PLT 55*  --  46*   Recent Labs    05/04/18 0704 05/05/18 0546  NA 135 135  K 3.4* 3.3*  CL 106 106  CO2 22 23  BUN 13 13  CREATININE 0.81 0.84  GLUCOSE 110* 105*  CALCIUM 7.9* 7.8*   No results for input(s): LABPT, INR in the last 72 hours.  EXAM General - Patient is Alert, Appropriate and Oriented Extremity - Neurologically intact Neurovascular intact Sensation intact distally Intact pulses distally Dorsiflexion/Plantar flexion intact No cellulitis present Compartment soft Dressing - scant drainage Motor Function - intact, moving foot and toes well on exam.    Past Medical History:  Diagnosis Date  . Anxiety   . Arthritis   . Bladder cancer  ([place])   . Hypertension   . Prostate cancer ([place])   . Urinary retention 2019   foley catheter place 11/2017  . UTI (urinary tract infection) 2019   frequent UTI's over last year    Assessment/Plan: 4 Days Post-Op Procedure(s) (LRB): INTRAMEDULLARY (IM) NAIL INTERTROCHANTRIC (Right) Active Problems:   Hip fracture (HCC)  Estimated body mass index is 26.08 kg/m as calculated from the following:   Height as of this encounter: 5\' 11"  (1.803 m).   Weight as of this encounter: 84.8 kg (187 lb). Up with therapy Discharge to SNF when medically cleared  Labs: None DVT Prophylaxis - Foot Pumps and TED hose Weight-Bearing as tolerated to right leg   Follow-up [facility] 2 weeks Staples will need to be removed 2 weeks postop followed by the application of benzoin and Steri-Strips    Ceferino Lang R. [place] 05/06/2018, 8:14 AM

## 2018-05-06 NOTE — Progress Notes (Signed)
[facility]. Report called to [facility]. EMS called. Waiting on transport.

## 2018-05-06 NOTE — Progress Notes (Signed)
Physical Therapy Treatment Patient Details Name: Vincent Poole MRN: [id number] DOB: Jun 25, 1935 Today's Date: 05/06/2018    History of Present Illness 83 y.o. male with a known history of anxiety, bladder cancer currently undergoing chemo, essential hypertension who is presenting after a fall and suffered R femur fx.  s/p ORIF 6/26.    PT Comments    Patient tolerated bed exercises fairly well today; he did agree to bed exercises only due to having issues with bladder spasms and pain. He was able to perform his exercises bilaterally, but was limited by pain/fatigue on the R. Patient was encouraged to go slowly with his exercises and take breaks when needed. He did well with deep breathing to relax, which was instructed to him with PT yesterday. Patient may continue to benefit from formal PT to address strength, mobility and ambulation.   Follow Up Recommendations  SNF     Equipment Recommendations  None recommended by PT    Recommendations for Other Services       Precautions / Restrictions Restrictions Weight Bearing Restrictions: Yes RLE Weight Bearing: Weight bearing as tolerated    Mobility  Bed Mobility               General bed mobility comments: (Pt. agreed to bed exercises only. )  Transfers                 General transfer comment: (Pt. agreed to bed exercises only. )  Ambulation/Gait Ambulation/Gait assistance: (Pt. agreed to bed exercises only. )               Stairs             Wheelchair Mobility    Modified Rankin (Stroke Patients Only)       Balance                                            Cognition Arousal/Alertness: Awake/alert Behavior During Therapy: Anxious Overall Cognitive Status: Within Functional Limits for tasks assessed                                 General Comments: Follows directions consistently.      Exercises General Exercises - Lower Extremity Ankle  Circles/Pumps: AROM;10 reps;Both;Supine Quad Sets: Strengthening;5 reps;Both;Supine Heel Slides: 5 reps;AROM;Left Hip ABduction/ADduction: 5 reps;Both;AAROM Straight Leg Raises: Strengthening;Left;5 reps(AAROM R 4 reps) Other Exercises Other Exercises: Enouraged patient to go slowly, breath deeply between reps and take breaks as needed with exercises    General Comments        Pertinent Vitals/Pain Pain Assessment: ("It's pretty bad when I move it.") Pain Score: (unable to rate) Pain Location: R hip Pain Intervention(s): Limited activity within patient's tolerance;Monitored during session;Premedicated before session;Utilized relaxation techniques    Home Living                      Prior Function            PT Goals (current goals can now be found in the care plan section) Acute Rehab PT Goals Patient Stated Goal: To go home    Frequency    BID      PT Plan Current plan remains appropriate    Co-evaluation  AM-PAC PT "6 Clicks" Daily Activity  Outcome Measure  Difficulty turning over in bed (including adjusting bedclothes, sheets and blankets)?: A Lot Difficulty moving from lying on back to sitting on the side of the bed? : A Lot Difficulty sitting down on and standing up from a chair with arms (e.g., wheelchair, bedside commode, etc,.)?: A Lot Help needed moving to and from a bed to chair (including a wheelchair)?: A Lot Help needed walking in hospital room?: A Lot Help needed climbing 3-5 steps with a railing? : Total 6 Click Score: 11    End of Session   Activity Tolerance: Patient limited by fatigue;Patient limited by pain Patient left: with bed alarm set;with family/visitor present   PT Visit Diagnosis: Muscle weakness (generalized) (M62.81);Difficulty in walking, not elsewhere classified (R26.2)     Time: 8416-6063 PT Time Calculation (min) (ACUTE ONLY): 19 min  Charges:  $Therapeutic Exercise: 8-22 mins                     G Codes:  Functional Assessment Tool Used: AM-PAC 6 Clicks Basic Mobility      Bertram Denver, PT, DPT, CWCE Vincent Poole 05/06/2018, 12:14 PM

## 2018-05-07 DIAGNOSIS — Z9289 Personal history of other medical treatment: Secondary | ICD-10-CM

## 2018-05-07 HISTORY — DX: Personal history of other medical treatment: Z92.89

## 2018-05-11 ENCOUNTER — Ambulatory Visit: Payer: Medicare Other

## 2018-05-11 ENCOUNTER — Ambulatory Visit: Payer: Medicare Other | Admitting: Oncology

## 2018-05-11 ENCOUNTER — Other Ambulatory Visit: Payer: Medicare Other

## 2018-05-17 ENCOUNTER — Ambulatory Visit: Payer: Medicare Other | Admitting: Urology

## 2018-05-17 ENCOUNTER — Telehealth: Payer: Self-pay

## 2018-05-17 ENCOUNTER — Ambulatory Visit: Payer: Medicare Other

## 2018-05-17 ENCOUNTER — Other Ambulatory Visit: Payer: Medicare Other

## 2018-05-17 ENCOUNTER — Ambulatory Visit (INDEPENDENT_AMBULATORY_CARE_PROVIDER_SITE_OTHER): Payer: Medicare Other

## 2018-05-17 DIAGNOSIS — C678 Malignant neoplasm of overlapping sites of bladder: Secondary | ICD-10-CM | POA: Diagnosis not present

## 2018-05-17 NOTE — Progress Notes (Signed)
Cath Change/ Replacement  Patient is present today for a catheter change due to urinary retention.  70ml of water was removed from the balloon, a 18 coudeFR foley cath was removed with out difficulty.  Patient was cleaned and prepped in a sterile fashion with betadine and 2% lidocaine jelly was instilled into the urethra. A 18 coude FR foley cath was replaced into the bladder no complications were noted Urine return was noted 28ml and urine was orange in color. The balloon was filled with 96ml of sterile water. A night bag was attached for drainage.  A night bag was also given to the patient and patient was given instruction on how to change from one bag to another. Patient was given proper instruction on catheter care.    Preformed by: Cristie Hem, CMA and Fonnie Jarvis, CMA  Follow up: 1 month

## 2018-05-17 NOTE — Telephone Encounter (Signed)
Per Dr. Bernardo Heater contacted [facility] and spoke with [place] caregiver for Vincent Poole, a verbal order was given to exchange his foley there at the home and today's apt for foley exchange was cancelled.

## 2018-05-18 ENCOUNTER — Encounter: Payer: Self-pay | Admitting: Oncology

## 2018-05-18 ENCOUNTER — Other Ambulatory Visit: Payer: Self-pay

## 2018-05-18 ENCOUNTER — Inpatient Hospital Stay (HOSPITAL_BASED_OUTPATIENT_CLINIC_OR_DEPARTMENT_OTHER): Payer: Medicare Other | Admitting: Oncology

## 2018-05-18 ENCOUNTER — Inpatient Hospital Stay: Payer: Medicare Other

## 2018-05-18 ENCOUNTER — Inpatient Hospital Stay: Payer: Medicare Other | Attending: Oncology

## 2018-05-18 VITALS — BP 130/75 | HR 113 | Temp 97.7°F | Resp 18 | Ht 71.0 in

## 2018-05-18 DIAGNOSIS — T451X5A Adverse effect of antineoplastic and immunosuppressive drugs, initial encounter: Secondary | ICD-10-CM

## 2018-05-18 DIAGNOSIS — Z923 Personal history of irradiation: Secondary | ICD-10-CM | POA: Diagnosis not present

## 2018-05-18 DIAGNOSIS — Z79899 Other long term (current) drug therapy: Secondary | ICD-10-CM | POA: Diagnosis not present

## 2018-05-18 DIAGNOSIS — T451X5S Adverse effect of antineoplastic and immunosuppressive drugs, sequela: Secondary | ICD-10-CM

## 2018-05-18 DIAGNOSIS — K5900 Constipation, unspecified: Secondary | ICD-10-CM

## 2018-05-18 DIAGNOSIS — Z9181 History of falling: Secondary | ICD-10-CM | POA: Diagnosis not present

## 2018-05-18 DIAGNOSIS — Z8546 Personal history of malignant neoplasm of prostate: Secondary | ICD-10-CM | POA: Diagnosis not present

## 2018-05-18 DIAGNOSIS — R5382 Chronic fatigue, unspecified: Secondary | ICD-10-CM | POA: Insufficient documentation

## 2018-05-18 DIAGNOSIS — D630 Anemia in neoplastic disease: Secondary | ICD-10-CM | POA: Insufficient documentation

## 2018-05-18 DIAGNOSIS — D696 Thrombocytopenia, unspecified: Secondary | ICD-10-CM

## 2018-05-18 DIAGNOSIS — C679 Malignant neoplasm of bladder, unspecified: Secondary | ICD-10-CM | POA: Diagnosis not present

## 2018-05-18 DIAGNOSIS — Z87440 Personal history of urinary (tract) infections: Secondary | ICD-10-CM | POA: Insufficient documentation

## 2018-05-18 DIAGNOSIS — R5381 Other malaise: Secondary | ICD-10-CM

## 2018-05-18 DIAGNOSIS — D6481 Anemia due to antineoplastic chemotherapy: Secondary | ICD-10-CM | POA: Diagnosis not present

## 2018-05-18 LAB — COMPREHENSIVE METABOLIC PANEL
ALT: 9 U/L (ref 0–44)
ANION GAP: 9 (ref 5–15)
AST: 15 U/L (ref 15–41)
Albumin: 2.8 g/dL — ABNORMAL LOW (ref 3.5–5.0)
Alkaline Phosphatase: 87 U/L (ref 38–126)
BILIRUBIN TOTAL: 1 mg/dL (ref 0.3–1.2)
BUN: 22 mg/dL (ref 8–23)
CALCIUM: 8.7 mg/dL — AB (ref 8.9–10.3)
CO2: 23 mmol/L (ref 22–32)
Chloride: 104 mmol/L (ref 98–111)
Creatinine, Ser: 0.9 mg/dL (ref 0.61–1.24)
GFR calc Af Amer: >60 mL/min (ref >60)
Glucose, Bld: 113 mg/dL — ABNORMAL HIGH (ref 70–99)
POTASSIUM: 4.4 mmol/L (ref 3.5–5.1)
Sodium: 136 mmol/L (ref 135–145)
TOTAL PROTEIN: 7 g/dL (ref 6.5–8.1)

## 2018-05-18 LAB — CBC WITH DIFFERENTIAL/PLATELET
BASOS PCT: 1 %
Basophils Absolute: 0.1 10*3/uL (ref 0–0.1)
EOS PCT: 0 %
Eosinophils Absolute: 0 10*3/uL (ref 0–0.7)
HCT: 32.5 % — ABNORMAL LOW (ref 40.0–52.0)
Hemoglobin: 10.8 g/dL — ABNORMAL LOW (ref 13.0–18.0)
LYMPHS PCT: 17 %
Lymphs Abs: 1.8 10*3/uL (ref 1.0–3.6)
MCH: 31.8 pg (ref 26.0–34.0)
MCHC: 33.1 g/dL (ref 32.0–36.0)
MCV: 96.1 fL (ref 80.0–100.0)
Monocytes Absolute: 1.1 10*3/uL — ABNORMAL HIGH (ref 0.2–1.0)
Monocytes Relative: 10 %
NEUTROS ABS: 7.5 10*3/uL — AB (ref 1.4–6.5)
Neutrophils Relative %: 72 %
PLATELETS: 263 10*3/uL (ref 150–440)
RBC: 3.39 MIL/uL — AB (ref 4.40–5.90)
RDW: 20 % — ABNORMAL HIGH (ref 11.5–14.5)
WBC: 10.4 10*3/uL (ref 3.8–10.6)

## 2018-05-21 NOTE — Progress Notes (Signed)
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 For his prostate cancer he has received IM RT in the past. He has been seeing Dr. Erlene Quan in the past for his recurrent UTIs as well as hematuria. CT scan in July 2018 showed bladder wall thickening with perivascular edema and inflammation in the right ureter greater than the left. Findings were thought to be due to pyelonephritis at that time. He underwent cystoscopy on 12/14/2017 which showed abnormal looking prostate with necrotic material lining the entire surface area. Diffuse copious debris within the bladder appearing to be erythematous without discrete bladder tumor but visualization was poor. He was then admitted to the hospital on 12/26/2017 with symptoms of UTI and sepsis as well as new moderate bilateral hydronephrosis.  CT abdomen and pelvis with contrast on 12/20/2017 again showed market bile bladder wall thickening with perivesicular edema. Within the lumen of the bladder there is a 93.9 cm soft tissue attenuating filling defect. This is indeterminate and could represent an area of blood clot versus urothelial lesion.  He underwent repeat cystoscopy with bilateral pyelogram and ureteral stent placement as well as TURBT and TURP. He was found to have a massive tumor involving the majority of the bladder with grossly necrotic and calcified material. There appeared to be multifocal  disease with large burden of the left anterior bladder wall extending posteriorly as well as adjacent to the bladder neck and beyond the right hemitrigone. Very little normal recognizable bladder mucosa remaining.   Biopsy from TURBT and TURP showed: High-grade urothelial carcinoma with extensive necrosisinvolving both the bladder and the prostate.CT chest did not reveal any evidence of metastatic disease. He was also not found to have any regional adenopathy on CT abdomen  Patient was seen by Dr. Erlene Quan and was not deemed to be a surgical candidate. He has been referred to [place] for definitive treatment options.  Patient lives with his wife at home and ambulates with a cane. He does need assistance with his ADLs to some extent. He has not had any falls and he denies any changes in his appetite or unintentional weight loss. Denies any pain. Reports some fatigue and occasional problems with constipation. He has had 3 hospitalizations last year for urinary tract infections and currently has a chronic Foley for the last 1 month  Dr. Erlene Quan performed interval TURBT on 01/15/18 and was able to debulk tumor as much as possible to reduce tumor burden  Patient received 5 cycles of carboplatin/ gemcitabine 2 weeks on and 1 week off ending 04/26/18. Patient did receive radiation for 10 days during cycle 2 of treatment. Given patients age, co-morbidities and frailty- he was not a cisplatin candidate.   Interval history- Patient is recovering slowly at the rehab and he took a few steps for 1st time today. He has chronic fatigue. Foley was changed yesterday. Denies other complaints  ECOG PS- 2-3 Pain scale- 0 Opioid associated constipation- no  Review of systems- Review of Systems  Constitutional: Positive for malaise/fatigue. Negative for  chills, fever and weight loss.  HENT: Negative for congestion, ear discharge and nosebleeds.   Eyes: Negative for blurred vision.  Respiratory: Negative  for cough, hemoptysis, sputum production, shortness of breath and wheezing.   Cardiovascular: Negative for chest pain, palpitations, orthopnea and claudication.  Gastrointestinal: Negative for abdominal pain, blood in stool, constipation, diarrhea, heartburn, melena, nausea and vomiting.  Genitourinary: Negative for dysuria, flank pain, frequency, hematuria and urgency.  Musculoskeletal: Negative for back pain, joint pain and myalgias.  Skin: Negative for rash.  Neurological: Negative for dizziness, tingling, focal weakness, seizures, weakness and headaches.  Endo/Heme/Allergies: Does not bruise/bleed easily.  Psychiatric/Behavioral: Negative for depression and suicidal ideas. The patient does not have insomnia.      Allergies  Allergen Reactions  . Penicillins Other (See Comments)    Caused nervousness as a child. Patient states he took as a teenager w/o problems Has patient had a PCN reaction causing immediate rash, facial/tongue/throat swelling, SOB or lightheadedness with hypotension: No Has patient had a PCN reaction causing severe rash involving mucus membranes or skin necrosis: No Has patient had a PCN reaction that required hospitalization: No Has patient had a PCN reaction occurring within the last 10 years: No If all of the above answers are "NO", then may proceed with     Past Medical History:  Diagnosis Date  . Anxiety   . Arthritis   . Bladder cancer ([place])   . Hypertension   . Prostate cancer ([place])   . Urinary retention 2019   foley catheter place 11/2017  . UTI (urinary tract infection) 2019   frequent UTI's over last year     Past Surgical History:  Procedure Laterality Date  . CARPAL TUNNEL RELEASE Right   . CHOLECYSTECTOMY  2004  . CYSTOSCOPY W/ URETERAL STENT PLACEMENT Bilateral 12/27/2017   Procedure: CYSTOSCOPY WITH RETROGRADE PYELOGRAM/URETERAL STENT PLACEMENT;  Surgeon: [name], MD;  Location: [facility];  Service: Urology;  Laterality: Bilateral;    . INTRAMEDULLARY (IM) NAIL INTERTROCHANTERIC Right 05/02/2018   Procedure: INTRAMEDULLARY (IM) NAIL INTERTROCHANTRIC;  Surgeon: [name], MD;  Location: [facility];  Service: Orthopedics;  Laterality: Right;  . LEG TENDON SURGERY Right 1958  . PORTA CATH INSERTION N/A 01/22/2018   Procedure: PORTA CATH INSERTION;  Surgeon: [name], MD;  Location: [facility];  Service: Cardiovascular;  Laterality: N/A;  . TRANSURETHRAL RESECTION OF BLADDER TUMOR N/A 12/27/2017   Procedure: TRANSURETHRAL RESECTION OF BLADDER TUMOR (TURBT);  Surgeon: [name], MD;  Location: [facility];  Service: Urology;  Laterality: N/A;  . TRANSURETHRAL RESECTION OF BLADDER TUMOR N/A 01/15/2018   Procedure: TRANSURETHRAL RESECTION OF BLADDER TUMOR (TURBT);  Surgeon: [name], MD;  Location: [facility];  Service: Urology;  Laterality: N/A;  Need 2 hrs for this case please    Social History   Socioeconomic History  . Marital status: Married    Spouse name: Not on file  . Number of children: Not on file  . Years of education: Not on file  . Highest education level: Not on file  Occupational History  . Occupation: retired  Scientific laboratory technician  . Financial resource strain: Not on file  . Food insecurity:    Worry: Not on file    Inability: Not on file  . Transportation needs:    Medical: Not on file    Non-medical: Not on file  Tobacco Use  . Smoking status: Never Smoker  . Smokeless tobacco: Never Used  Substance and Sexual Activity  .  Alcohol use: No    Alcohol/week: 0.0 oz  . Drug use: No  . Sexual activity: Never  Lifestyle  . Physical activity:    Days per week: Not on file    Minutes per session: Not on file  . Stress: Not on file  Relationships  . Social connections:    Talks on phone: Not on file    Gets together: Not on file    Attends religious service: Not on file    Active member of club or organization: Not on file    Attends meetings of clubs or organizations: Not on file     Relationship status: Not on file  . Intimate partner violence:    Fear of current or ex partner: Not on file    Emotionally abused: Not on file    Physically abused: Not on file    Forced sexual activity: Not on file  Other Topics Concern  . Not on file  Social History Narrative  . Not on file    Family History  Problem Relation Age of Onset  . Cancer Mother   . Chronic Renal Failure Mother   . Heart disease Father      Current Outpatient Medications:  .  cetirizine (ZYRTEC) 10 MG tablet, Take 10 mg by mouth daily., Disp: , Rfl:  .  CRANBERRY PO, Take 1 tablet by mouth 2 (two) times daily., Disp: , Rfl:  .  docusate sodium (COLACE) 100 MG capsule, Take 1 capsule (100 mg total) by mouth 2 (two) times daily., Disp: 10 capsule, Rfl: 0 .  ferrous sulfate 325 (65 FE) MG tablet, Take 1 tablet (325 mg total) by mouth 2 (two) times daily with a meal., Disp: , Rfl: 3 .  finasteride (PROSCAR) 5 MG tablet, Take 1 tablet (5 mg total) by mouth daily., Disp: 90 tablet, Rfl: 3 .  magnesium hydroxide (MILK OF MAGNESIA) 400 MG/5ML suspension, Take 30 mLs by mouth daily as needed for mild constipation., Disp: 360 mL, Rfl: 0 .  mirabegron ER (MYRBETRIQ) 50 MG TB24 tablet, Take 1 tablet (50 mg total) by mouth daily., Disp: 30 tablet, Rfl: 3 .  oxyCODONE (OXY IR/ROXICODONE) 5 MG immediate release tablet, Take 1-2 tablets (5-10 mg total) by mouth every 4 (four) hours as needed for moderate pain (pain score 4-6)., Disp: 30 tablet, Rfl: 0 .  pantoprazole (PROTONIX) 40 MG tablet, Take 1 tablet (40 mg total) by mouth daily., Disp: , Rfl:  .  tamsulosin (FLOMAX) 0.4 MG CAPS capsule, Take 1 capsule (0.4 mg total) by mouth 2 (two) times daily., Disp: 60 capsule, Rfl: 11 .  vitamin B-12 1000 MCG tablet, Take 1 tablet (1,000 mcg total) by mouth daily., Disp: , Rfl:  .  acetaminophen (TYLENOL) 325 MG tablet, Take 2 tablets (650 mg total) by mouth every 6 (six) hours as needed for mild pain (pain score 1-3 or  temp > 100.5). (Patient not taking: Reported on 05/18/2018), Disp: , Rfl:  .  metoCLOPramide (REGLAN) 5 MG tablet, Take 1-2 tablets (5-10 mg total) by mouth every 8 (eight) hours as needed for nausea (if ondansetron (ZOFRAN) ineffective.). (Patient not taking: Reported on 05/18/2018), Disp: , Rfl:  .  ondansetron (ZOFRAN) 4 MG tablet, Take 1 tablet (4 mg total) by mouth every 6 (six) hours as needed for nausea. (Patient not taking: Reported on 05/18/2018), Disp: 20 tablet, Rfl: 0 .  polyethylene glycol powder (MIRALAX) powder, Take 17 g by mouth daily as needed. Can increase to 3  times a day as needed for constipation but hold medication if has diarrhea (Patient not taking: Reported on 04/12/2018), Disp: 255 g, Rfl: 0  Physical exam:  Vitals:   05/18/18 1440 05/18/18 1443  BP: 130/75   Pulse: (!) 120 (!) 113  Resp: 18   Temp: 97.7 F (36.5 C)   TempSrc: Tympanic   SpO2:  100%  Height: 5\' 11"  (1.803 m)    Physical Exam  Constitutional: He is oriented to person, place, and time.  Frail elderly gentleman sitting in a wheelchair. No acute distress.   HENT:  Head: Normocephalic and atraumatic.  Eyes: Pupils are equal, round, and reactive to light. EOM are normal.  Neck: Normal range of motion.  Cardiovascular: Normal rate, regular rhythm and normal heart sounds.  Pulmonary/Chest: Effort normal and breath sounds normal.  Abdominal: Soft. Bowel sounds are normal.  Foley in place  Musculoskeletal:  Dressing in place at surgical site over right thigh  Neurological: He is alert and oriented to person, place, and time.  Skin: Skin is warm and dry.     CMP Latest Ref Rng & Units 05/18/2018  Glucose 70 - 99 mg/dL 113(H)  BUN 8 - 23 mg/dL 22  Creatinine 0.61 - 1.24 mg/dL 0.90  Sodium 135 - 145 mmol/L 136  Potassium 3.5 - 5.1 mmol/L 4.4  Chloride 98 - 111 mmol/L 104  CO2 22 - 32 mmol/L 23  Calcium 8.9 - 10.3 mg/dL 8.7(L)  Total Protein 6.5 - 8.1 g/dL 7.0  Total Bilirubin 0.3 - 1.2 mg/dL 1.0   Alkaline Phos 38 - 126 U/L 87  AST 15 - 41 U/L 15  ALT 0 - 44 U/L 9   CBC Latest Ref Rng & Units 05/18/2018  WBC 3.8 - 10.6 K/uL 10.4  Hemoglobin 13.0 - 18.0 g/dL 10.8(L)  Hematocrit 40.0 - 52.0 % 32.5(L)  Platelets 150 - 440 K/uL 263    No images are attached to the encounter.  Dg Chest 1 View  Result Date: 05/01/2018 CLINICAL DATA:  Preoperative chest radiograph prior to RIGHT femur surgery. EXAM: CHEST  1 VIEW COMPARISON:  04/04/2018 and prior studies FINDINGS: Cardiomegaly noted. A RIGHT IJ Port-A-Cath is noted with tip overlying the mid SVC. There is no evidence of focal airspace disease, pulmonary edema, suspicious pulmonary nodule/mass, pleural effusion, or pneumothorax. No acute bony abnormalities are identified. Degenerative changes in the shoulders noted. IMPRESSION: Cardiomegaly without evidence of active cardiopulmonary disease. Electronically Signed   By: Margarette Canada M.D.   On: 05/01/2018 14:25   Dg Pelvis 1-2 Views  Result Date: 05/01/2018 CLINICAL DATA:  Status post fall at home today. Patient is complaining of hip and thigh pain and is unable to internally rotate the leg. EXAM: PELVIS - 1-2 VIEW COMPARISON:  Right femur series of today's date FINDINGS: The bony pelvis is subjectively osteopenic. There is severe joint space loss of the right hip with irregularity of the femoral head. There is abnormal lucency in the intertrochanteric region consistent with an acute fracture. There are degenerative changes of the left hip. Double pigtail ureteral stents are present. IMPRESSION: Acute intertrochanteric fracture of the right hip. No definite acute pelvic fracture. 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 Electronically Signed   By: David  Martinique M.D.   On: 05/01/2018 13:54   Dg Knee Complete 4 Views Left  Result Date: 05/01/2018 CLINICAL DATA:  Left knee pain following a fall at home today. EXAM: LEFT KNEE - COMPLETE 4+  VIEW COMPARISON:  None.  FINDINGS: The bones are subjectively mildly osteopenic. There is mild narrowing of the medial joint compartment. There is no acute tibial plateau fracture nor other acute fracture. There is mild beaking of the tibial spines. There is spurring of the superior and inferior articular margins of the patella. The visualized portions of the fibula are intact. There is no joint effusion. IMPRESSION: Mild degenerative change of the left knee. No acute fracture or dislocation. Electronically Signed   By: David  Martinique M.D.   On: 05/01/2018 13:57   Dg C-arm 1-60 Min  Result Date: 05/03/2018 CLINICAL DATA:  Intramedullary right nail fixation of the hip. EXAM: DG C-ARM 61-120 MIN; OPERATIVE RIGHT HIP WITH PELVIS COMPARISON:  None. FINDINGS: 1 minutes 30 seconds of fluoroscopic time was utilized. Four intraoperative images were acquired of the right femur demonstrating a long-stem femoral nail without intraoperative complications noted. IMPRESSION: Fluoroscopic time utilized for placement of a long-stem right femoral nail across an intratrochanteric fracture of the right femur. Fine bony detail is limited by the C-arm fluoroscopic technique. Alignment is near anatomic. Electronically Signed   By: Ashley Royalty M.D.   On: 05/03/2018 03:26   Dg Hip Operative Unilat With Pelvis Right  Result Date: 05/03/2018 CLINICAL DATA:  Intramedullary right nail fixation of the hip. EXAM: DG C-ARM 61-120 MIN; OPERATIVE RIGHT HIP WITH PELVIS COMPARISON:  None. FINDINGS: 1 minutes 30 seconds of fluoroscopic time was utilized. Four intraoperative images were acquired of the right femur demonstrating a long-stem femoral nail without intraoperative complications noted. IMPRESSION: Fluoroscopic time utilized for placement of a long-stem right femoral nail across an intratrochanteric fracture of the right femur. Fine bony detail is limited by the C-arm fluoroscopic technique. Alignment is near anatomic. Electronically Signed   By: Ashley Royalty  M.D.   On: 05/03/2018 03:26   Dg Femur Min 2 Views Right  Result Date: 05/01/2018 CLINICAL DATA:  The patient has sustained a fall at home today and is complaining of right hip and thigh pain and is unable to internally rotate the leg. EXAM: RIGHT FEMUR 2 VIEWS COMPARISON:  AP pelvis of today's date FINDINGS: There is an acute intertrochanteric fracture of the right femur. The femoral head and neck 0 grossly intact. There is severe osteoarthritic joint space loss of the right hip. The sub trochanteric region and remainder of the shaft and distal femur exhibit no acute abnormalities. IMPRESSION: There is an acute intertrochanteric fracture of the right hip. No fracture is seen more distally. Electronically Signed   By: David  Martinique M.D.   On: 05/01/2018 13:55     Assessment and plan- Patient is a 83 y.o. 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 However patient sustained right hip fracture and is still slowly recovering from that. I will hold off on more chemo at this time. He will be getting his stentchanged son and perhaps he can get a biopsy at that time. He has chronic inflmmatory changes/ necrotic tissue at the site of bladder mass. Unclear how much is active maliganncy versus chronic inflammation.  I will plan to repeat ct chest abdomen and pelvis in spet 2019 and see him theerafter. Cbc/ cmp prior to scans  Chemo induced anemia: stable. Continue to monitor     Visit Diagnosis 1. Malignant neoplasm of urinary bladder, unspecified site ([place])   2. Anemia due to antineoplastic chemotherapy  Dr. Randa Evens, MD, MPH [facility] [phone] 05/21/2018 9:59 AM

## 2018-05-22 ENCOUNTER — Inpatient Hospital Stay: Payer: Medicare Other | Admitting: Oncology

## 2018-05-31 ENCOUNTER — Other Ambulatory Visit
Admission: RE | Admit: 2018-05-31 | Discharge: 2018-05-31 | Disposition: A | Discharge status: Home / Self Care | Payer: Medicare Other | Source: Other Acute Inpatient Hospital | Attending: Family Medicine | Admitting: Family Medicine

## 2018-05-31 DIAGNOSIS — L89622 Pressure ulcer of left heel, stage 2: Secondary | ICD-10-CM | POA: Insufficient documentation

## 2018-06-04 LAB — AEROBIC CULTURE W GRAM STAIN (SUPERFICIAL SPECIMEN)

## 2018-06-04 LAB — AEROBIC CULTURE  (SUPERFICIAL SPECIMEN)

## 2018-06-07 ENCOUNTER — Inpatient Hospital Stay: Payer: Medicare Other | Attending: Oncology

## 2018-06-07 DIAGNOSIS — Z923 Personal history of irradiation: Secondary | ICD-10-CM | POA: Diagnosis not present

## 2018-06-07 DIAGNOSIS — Z452 Encounter for adjustment and management of vascular access device: Secondary | ICD-10-CM | POA: Diagnosis not present

## 2018-06-07 DIAGNOSIS — C679 Malignant neoplasm of bladder, unspecified: Secondary | ICD-10-CM

## 2018-06-07 MED ORDER — HEPARIN SOD (PORK) LOCK FLUSH 100 UNIT/ML IV SOLN
500.0000 [IU] | Freq: Once | INTRAVENOUS | Status: AC
  Administered 2018-06-07: 500 [IU] via INTRAVENOUS

## 2018-06-07 MED ORDER — SODIUM CHLORIDE 0.9% FLUSH
10.0000 mL | Freq: Once | INTRAVENOUS | Status: AC
  Administered 2018-06-07: 10 mL via INTRAVENOUS
  Filled 2018-06-07: qty 10

## 2018-06-19 ENCOUNTER — Telehealth: Payer: Self-pay | Admitting: Urology

## 2018-06-19 ENCOUNTER — Ambulatory Visit (INDEPENDENT_AMBULATORY_CARE_PROVIDER_SITE_OTHER): Payer: Medicare Other | Admitting: Urology

## 2018-06-19 ENCOUNTER — Encounter: Payer: Self-pay | Admitting: Urology

## 2018-06-19 VITALS — BP 144/67 | HR 93

## 2018-06-19 DIAGNOSIS — C678 Malignant neoplasm of overlapping sites of bladder: Secondary | ICD-10-CM | POA: Diagnosis not present

## 2018-06-19 DIAGNOSIS — N135 Crossing vessel and stricture of ureter without hydronephrosis: Secondary | ICD-10-CM

## 2018-06-19 NOTE — Progress Notes (Signed)
06/19/2018 12:24 PM   Vincent Poole 11/23/1934 [id number]  Referring provider: [name], MD [facility] [place], [place] [address]  Chief Complaint  Patient presents with  . Pre-op Exam    HPI: 83 year old male who presents today to discuss ureteral stent exchange.  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  He was initially diagnosed on 12/27/2017 at which time indwelling ureteral stents were placed for bilateral ureteral obstruction.  He underwent TURBT on 01/15/18 for the purpose of debulking.  He received 5 cycles of carboplatin/ gemcitabine 2 weeks on and 1 week off ending 04/26/18. Patient did receive radiation for 10 days during cycle 2 of treatment. Given patients age, co-morbidities and frailty- he was not a cisplatin candidate.  More recently, he has had a medical setback with a fall and right hip fracture.  [facility] is currently residing [facility].  He started ambulating for the first time last week but has had a setback with some breakdown of his left heel.  He continues have a chronic indwelling Foley.  This is been exchanged each month.  He is scheduled for restaging imaging on 07/12/2018.  Overall, he is doing fair.  PMH: Past Medical History:  Diagnosis Date  . Anxiety   . Arthritis   . Bladder cancer ([place])   . Hypertension   . Prostate cancer ([place])   . Urinary retention 2019   foley catheter place 11/2017  . UTI (urinary tract infection) 2019   frequent UTI's over last year    Surgical History: Past Surgical History:  Procedure Laterality Date  . CARPAL TUNNEL RELEASE Right   . CHOLECYSTECTOMY  2004  . CYSTOSCOPY W/ URETERAL STENT PLACEMENT Bilateral 12/27/2017   Procedure: CYSTOSCOPY WITH RETROGRADE PYELOGRAM/URETERAL STENT PLACEMENT;  Surgeon: [name], MD;  Location: [facility];  Service: Urology;  Laterality: Bilateral;  .  INTRAMEDULLARY (IM) NAIL INTERTROCHANTERIC Right 05/02/2018   Procedure: INTRAMEDULLARY (IM) NAIL INTERTROCHANTRIC;  Surgeon: [name], MD;  Location: [facility] OR[facility];  Service: Orthopedics;  Laterality: Right;  . LEG TENDON SURGERY Right 1958  . PORTA CATH INSERTION N/A 01/22/2018   Procedure: PORTA CATH INSERTION;  Surgeon: [name], MD;  Location: [facility];  Service: Cardiovascular;  Laterality: N/A;  . TRANSURETHRAL RESECTION OF BLADDER TUMOR N/A 12/27/2017   Procedure: TRANSURETHRAL RESECTION OF BLADDER TUMOR (TURBT);  Surgeon: [name], MD;  Location: [facility];  Service: Urology;  Laterality: N/A;  . TRANSURETHRAL RESECTION OF BLADDER TUMOR N/A 01/15/2018   Procedure: TRANSURETHRAL RESECTION OF BLADDER TUMOR (TURBT);  Surgeon: [name], MD;  Location: [facility];  Service: Urology;  Laterality: N/A;  Need 2 hrs for this case please    Home Medications:  Allergies as of 06/19/2018      Reactions   Penicillins Other (See Comments)   Caused nervousness as a child. Patient states he took as a teenager w/o problems Has patient had a PCN reaction causing immediate rash, facial/tongue/throat swelling, SOB or lightheadedness with hypotension: No Has patient had a PCN reaction causing severe rash involving mucus membranes or skin necrosis: No Has patient had a PCN reaction that required hospitalization: No Has patient had a PCN reaction occurring within the last 10 years: No If all of the above answers are "NO", then may proceed with      Medication List        Accurate as of  06/19/18 12:24 PM. Always use your most recent med list.          acetaminophen 325 MG tablet Commonly known as:  TYLENOL Take 2 tablets (650 mg total) by mouth every 6 (six) hours as needed for mild pain (pain score 1-3 or temp > 100.5).   cetirizine 10 MG tablet Commonly known as:  ZYRTEC Take 10 mg by mouth daily.   CRANBERRY PO Take 1 tablet by mouth 2 (two) times daily.     cyanocobalamin 1000 MCG tablet Take 1 tablet (1,000 mcg total) by mouth daily.   docusate sodium 100 MG capsule Commonly known as:  COLACE Take 1 capsule (100 mg total) by mouth 2 (two) times daily.   ferrous sulfate 325 (65 FE) MG tablet Take 1 tablet (325 mg total) by mouth 2 (two) times daily with a meal.   finasteride 5 MG tablet Commonly known as:  PROSCAR Take 1 tablet (5 mg total) by mouth daily.   magnesium hydroxide 400 MG/5ML suspension Commonly known as:  MILK OF MAGNESIA Take 30 mLs by mouth daily as needed for mild constipation.   metoCLOPramide 5 MG tablet Commonly known as:  REGLAN Take 1-2 tablets (5-10 mg total) by mouth every 8 (eight) hours as needed for nausea (if ondansetron (ZOFRAN) ineffective.).   mirabegron ER 50 MG Tb24 tablet Commonly known as:  MYRBETRIQ Take 1 tablet (50 mg total) by mouth daily.   ondansetron 4 MG tablet Commonly known as:  ZOFRAN Take 1 tablet (4 mg total) by mouth every 6 (six) hours as needed for nausea.   oxyCODONE 5 MG immediate release tablet Commonly known as:  Oxy IR/ROXICODONE Take 1-2 tablets (5-10 mg total) by mouth every 4 (four) hours as needed for moderate pain (pain score 4-6).   pantoprazole 40 MG tablet Commonly known as:  PROTONIX Take 1 tablet (40 mg total) by mouth daily.   polyethylene glycol powder powder Commonly known as:  GLYCOLAX/MIRALAX Take 17 g by mouth daily as needed. Can increase to 3 times a day as needed for constipation but hold medication if has diarrhea   tamsulosin 0.4 MG Caps capsule Commonly known as:  FLOMAX Take 1 capsule (0.4 mg total) by mouth 2 (two) times daily.       Allergies:  Allergies  Allergen Reactions  . Penicillins Other (See Comments)    Caused nervousness as a child. Patient states he took as a teenager w/o problems Has patient had a PCN reaction causing immediate rash, facial/tongue/throat swelling, SOB or lightheadedness with hypotension: No Has patient  had a PCN reaction causing severe rash involving mucus membranes or skin necrosis: No Has patient had a PCN reaction that required hospitalization: No Has patient had a PCN reaction occurring within the last 10 years: No If all of the above answers are "NO", then may proceed with    Family History: Family History  Problem Relation Age of Onset  . Cancer Mother   . Chronic Renal Failure Mother   . Heart disease Father     Social History:  reports that he has never smoked. He has never used smokeless tobacco. He reports that he does not drink alcohol or use drugs.  ROS: UROLOGY Frequent Urination?: No Hard to postpone urination?: No Burning/pain with urination?: No Get up at night to urinate?: No Leakage of urine?: No Urine stream starts and stops?: No Trouble starting stream?: No Do you have to strain to urinate?: No Blood in urine?: No Urinary tract  infection?: No Sexually transmitted disease?: No Injury to kidneys or bladder?: No Painful intercourse?: No Weak stream?: No Erection problems?: No Penile pain?: No  Gastrointestinal Nausea?: No Vomiting?: No Indigestion/heartburn?: No Diarrhea?: No Constipation?: No  Constitutional Fever: No Night sweats?: No Weight loss?: No Fatigue?: No  Skin Skin rash/lesions?: No Itching?: No  Eyes Blurred vision?: No Double vision?: No  Ears/Nose/Throat Sore throat?: No Sinus problems?: No  Hematologic/Lymphatic Swollen glands?: No Easy bruising?: No  Cardiovascular Leg swelling?: No Chest pain?: No  Respiratory Cough?: No Shortness of breath?: No  Endocrine Excessive thirst?: No  Musculoskeletal Back pain?: No Joint pain?: No  Neurological Headaches?: No Dizziness?: No  Psychologic Depression?: No Anxiety?: Yes  Physical Exam: BP (!) 144/67   Pulse 93   Constitutional:  Alert and oriented, No acute distress.  In wheelchair today, accompanied by wife. HEENT: [place] AT, moist mucus membranes.   Trachea midline, no masses. Cardiovascular: Bilateral lower extremity edema appreciated. Respiratory: Normal respiratory effort, no increased work of breathing. GU: Foley catheter in place draining slightly cloudy yellow urine.  Malodorous. Skin: No rashes, bruises or suspicious lesions. Neurologic: Grossly intact, no focal deficits, moving all 4 extremities. Psychiatric: Normal mood and affect.  Laboratory Data: Lab Results  Component Value Date   WBC 10.4 05/18/2018   HGB 10.8 (L) 05/18/2018   HCT 32.5 (L) 05/18/2018   MCV 96.1 05/18/2018   PLT 263 05/18/2018    Lab Results  Component Value Date   CREATININE 0.90 05/18/2018    Lab Results  Component Value Date   PSA 0.02 12/22/2016   PSA 0.05 12/24/2015   PSA 0.05 05/27/2015    Urinalysis N/a  Pertinent Imaging: No new recent interval imaging  Assessment & Plan:    1. Malignant neoplasm of overlapping sites of bladder ([place]) Invasive locally advanced bladder cancer receiving tri-modal therapy  Recent setback after fall, right hip fracture-further chemotherapy is being held for the time being Scheduled for repeat staging imaging 07/12/2018 Continue Foley catheter for comfort Tentatively plan for possible TURBT at the time of stent exchange if any viable tumor is identified - CULTURE, URINE COMPREHENSIVE  2. Bilateral ureteral obstruction Filling [facility] ureteral/2019 due for exchange of his ureteral stents given recent setback, will hold off until next month to exchange the stents We will plan for tentative stent exchange mid next month to give him some additional time to recover In the interim, he should have repeat staging imaging to assess whether or not the stents exchanged He is agreeable this plan Preop urine culture obtained today   [name], MD  [facility] [address] [place], [place] [address] [phone]

## 2018-06-19 NOTE — Telephone Encounter (Signed)
Please let this patient/his wife know that upon reviewing his chart, I see that he still on finasteride and Flomax for his prostate.  Given that he has an indwelling Foley catheter, these medications are no longer necessary.  He may stop these medications.  I would recommend continuation of Myrbetriq 50 mg for bladder spasms/comfort.  [name], MD

## 2018-06-19 NOTE — H&P (View-Only) (Signed)
06/19/2018 12:24 PM   Vincent Poole 02/26/1935 [id number]  Referring provider: [name], MD [facility] [place], [place] [address]  Chief Complaint  Patient presents with  . Pre-op Exam    HPI: 83 year old male who presents today to discuss ureteral stent exchange.  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  He was initially diagnosed on 12/27/2017 at which time indwelling ureteral stents were placed for bilateral ureteral obstruction.  He underwent TURBT on 01/15/18 for the purpose of debulking.  He received 5 cycles of carboplatin/ gemcitabine 2 weeks on and 1 week off ending 04/26/18. Patient did receive radiation for 10 days during cycle 2 of treatment. Given patients age, co-morbidities and frailty- he was not a cisplatin candidate.  More recently, he has had a medical setback with a fall and right hip fracture.  He is currently residing at [facility].  He started ambulating for the first time last week but has had a setback with some breakdown of his left heel.  He continues have a chronic indwelling Foley.  This is been exchanged each month.  He is scheduled for restaging imaging on 07/12/2018.  Overall, he is doing fair.  PMH: Past Medical History:  Diagnosis Date  . Anxiety   . Arthritis   . Bladder cancer ([place])   . Hypertension   . Prostate cancer ([place])   . Urinary retention 2019   foley catheter place 11/2017  . UTI (urinary tract infection) 2019   frequent UTI's over last year    Surgical History: Past Surgical History:  Procedure Laterality Date  . CARPAL TUNNEL RELEASE Right   . CHOLECYSTECTOMY  2004  . CYSTOSCOPY W/ URETERAL STENT PLACEMENT Bilateral 12/27/2017   Procedure: CYSTOSCOPY WITH RETROGRADE PYELOGRAM/URETERAL STENT PLACEMENT;  Surgeon: [name], MD;  Location: [facility];  Service: Urology;  Laterality: Bilateral;  .  INTRAMEDULLARY (IM) NAIL INTERTROCHANTERIC Right 05/02/2018   Procedure: INTRAMEDULLARY (IM) NAIL INTERTROCHANTRIC;  Surgeon: [name], MD;  Location: [facility];  Service: Orthopedics;  Laterality: Right;  . LEG TENDON SURGERY Right 1958  . PORTA CATH INSERTION N/A 01/22/2018   Procedure: PORTA CATH INSERTION;  Surgeon: [name], MD;  Location: [facility];  Service: Cardiovascular;  Laterality: N/A;  . TRANSURETHRAL RESECTION OF BLADDER TUMOR N/A 12/27/2017   Procedure: TRANSURETHRAL RESECTION OF BLADDER TUMOR (TURBT);  Surgeon: [name], MD;  Location: [facility];  Service: Urology;  Laterality: N/A;  . TRANSURETHRAL RESECTION OF BLADDER TUMOR N/A 01/15/2018   Procedure: TRANSURETHRAL RESECTION OF BLADDER TUMOR (TURBT);  Surgeon: [name], MD;  Location: [facility];  Service: Urology;  Laterality: N/A;  Need 2 hrs for this case please    Home Medications:  Allergies as of 06/19/2018      Reactions   Penicillins Other (See Comments)   Caused nervousness as a child. Patient states he took as a teenager w/o problems Has patient had a PCN reaction causing immediate rash, facial/tongue/throat swelling, SOB or lightheadedness with hypotension: No Has patient had a PCN reaction causing severe rash involving mucus membranes or skin necrosis: No Has patient had a PCN reaction that required hospitalization: No Has patient had a PCN reaction occurring within the last 10 years: No If all of the above answers are "NO", then may proceed with      Medication List        Accurate as of  06/19/18 12:24 PM. Always use your most recent med list.          acetaminophen 325 MG tablet Commonly known as:  TYLENOL Take 2 tablets (650 mg total) by mouth every 6 (six) hours as needed for mild pain (pain score 1-3 or temp > 100.5).   cetirizine 10 MG tablet Commonly known as:  ZYRTEC Take 10 mg by mouth daily.   CRANBERRY PO Take 1 tablet by mouth 2 (two) times daily.     cyanocobalamin 1000 MCG tablet Take 1 tablet (1,000 mcg total) by mouth daily.   docusate sodium 100 MG capsule Commonly known as:  COLACE Take 1 capsule (100 mg total) by mouth 2 (two) times daily.   ferrous sulfate 325 (65 FE) MG tablet Take 1 tablet (325 mg total) by mouth 2 (two) times daily with a meal.   finasteride 5 MG tablet Commonly known as:  PROSCAR Take 1 tablet (5 mg total) by mouth daily.   magnesium hydroxide 400 MG/5ML suspension Commonly known as:  MILK OF MAGNESIA Take 30 mLs by mouth daily as needed for mild constipation.   metoCLOPramide 5 MG tablet Commonly known as:  REGLAN Take 1-2 tablets (5-10 mg total) by mouth every 8 (eight) hours as needed for nausea (if ondansetron (ZOFRAN) ineffective.).   mirabegron ER 50 MG Tb24 tablet Commonly known as:  MYRBETRIQ Take 1 tablet (50 mg total) by mouth daily.   ondansetron 4 MG tablet Commonly known as:  ZOFRAN Take 1 tablet (4 mg total) by mouth every 6 (six) hours as needed for nausea.   oxyCODONE 5 MG immediate release tablet Commonly known as:  Oxy IR/ROXICODONE Take 1-2 tablets (5-10 mg total) by mouth every 4 (four) hours as needed for moderate pain (pain score 4-6).   pantoprazole 40 MG tablet Commonly known as:  PROTONIX Take 1 tablet (40 mg total) by mouth daily.   polyethylene glycol powder powder Commonly known as:  GLYCOLAX/MIRALAX Take 17 g by mouth daily as needed. Can increase to 3 times a day as needed for constipation but hold medication if has diarrhea   tamsulosin 0.4 MG Caps capsule Commonly known as:  FLOMAX Take 1 capsule (0.4 mg total) by mouth 2 (two) times daily.       Allergies:  Allergies  Allergen Reactions  . Penicillins Other (See Comments)    Caused nervousness as a child. Patient states he took as a teenager w/o problems Has patient had a PCN reaction causing immediate rash, facial/tongue/throat swelling, SOB or lightheadedness with hypotension: No Has patient  had a PCN reaction causing severe rash involving mucus membranes or skin necrosis: No Has patient had a PCN reaction that required hospitalization: No Has patient had a PCN reaction occurring within the last 10 years: No If all of the above answers are "NO", then may proceed with    Family History: Family History  Problem Relation Age of Onset  . Cancer Mother   . Chronic Renal Failure Mother   . Heart disease Father     Social History:  reports that he has never smoked. He has never used smokeless tobacco. He reports that he does not drink alcohol or use drugs.  ROS: UROLOGY Frequent Urination?: No Hard to postpone urination?: No Burning/pain with urination?: No Get up at night to urinate?: No Leakage of urine?: No Urine stream starts and stops?: No Trouble starting stream?: No Do you have to strain to urinate?: No Blood in urine?: No Urinary tract  infection?: No Sexually transmitted disease?: No Injury to kidneys or bladder?: No Painful intercourse?: No Weak stream?: No Erection problems?: No Penile pain?: No  Gastrointestinal Nausea?: No Vomiting?: No Indigestion/heartburn?: No Diarrhea?: No Constipation?: No  Constitutional Fever: No Night sweats?: No Weight loss?: No Fatigue?: No  Skin Skin rash/lesions?: No Itching?: No  Eyes Blurred vision?: No Double vision?: No  Ears/Nose/Throat Sore throat?: No Sinus problems?: No  Hematologic/Lymphatic Swollen glands?: No Easy bruising?: No  Cardiovascular Leg swelling?: No Chest pain?: No  Respiratory Cough?: No Shortness of breath?: No  Endocrine Excessive thirst?: No  Musculoskeletal Back pain?: No Joint pain?: No  Neurological Headaches?: No Dizziness?: No  Psychologic Depression?: No Anxiety?: Yes  Physical Exam: BP (!) 144/67   Pulse 93   Constitutional:  Alert and oriented, No acute distress.  In wheelchair today, accompanied by wife. HEENT: [place] AT, moist mucus membranes.   Trachea midline, no masses. Cardiovascular: Bilateral lower extremity edema appreciated. Respiratory: Normal respiratory effort, no increased work of breathing. GU: Foley catheter in place draining slightly cloudy yellow urine.  Malodorous. Skin: No rashes, bruises or suspicious lesions. Neurologic: Grossly intact, no focal deficits, moving all 4 extremities. Psychiatric: Normal mood and affect.  Laboratory Data: Lab Results  Component Value Date   WBC 10.4 05/18/2018   HGB 10.8 (L) 05/18/2018   HCT 32.5 (L) 05/18/2018   MCV 96.1 05/18/2018   PLT 263 05/18/2018    Lab Results  Component Value Date   CREATININE 0.90 05/18/2018    Lab Results  Component Value Date   PSA 0.02 12/22/2016   PSA 0.05 12/24/2015   PSA 0.05 05/27/2015    Urinalysis N/a  Pertinent Imaging: No new recent interval imaging  Assessment & Plan:    1. Malignant neoplasm of overlapping sites of bladder ([place]) Invasive locally advanced bladder cancer receiving tri-modal therapy  Recent setback after fall, right hip fracture-further chemotherapy is being held for the time being Scheduled for repeat staging imaging 07/12/2018 Continue Foley catheter for comfort Tentatively plan for possible TURBT at the time of stent exchange if any viable tumor is identified - CULTURE, URINE COMPREHENSIVE  2. Bilateral ureteral obstruction Filling [facility] ureteral/2019 due for exchange of his ureteral stents given recent setback, will hold off until next month to exchange the stents We will plan for tentative stent exchange mid next month to give him some additional time to recover In the interim, he should have repeat staging imaging to assess whether or not the stents exchanged He is agreeable this plan Preop urine culture obtained today   [name], MD  [facility] [address], [place], [place] [address] [phone]

## 2018-06-21 ENCOUNTER — Other Ambulatory Visit: Payer: Self-pay | Admitting: Radiology

## 2018-06-21 DIAGNOSIS — N135 Crossing vessel and stricture of ureter without hydronephrosis: Secondary | ICD-10-CM

## 2018-06-21 DIAGNOSIS — C678 Malignant neoplasm of overlapping sites of bladder: Secondary | ICD-10-CM

## 2018-06-21 LAB — CULTURE, URINE COMPREHENSIVE

## 2018-06-21 NOTE — Telephone Encounter (Signed)
Patients wife notified

## 2018-06-21 NOTE — Telephone Encounter (Signed)
Notified patient's wife of Dr Cherrie Gauze note below & that surgery has been scheduled for 07/18/2018. Also faxed medication change orders to Maudie Mercury [phone], [phone]) at [facility] where patient currently resides.

## 2018-06-22 ENCOUNTER — Telehealth: Payer: Self-pay | Admitting: Radiology

## 2018-06-22 DIAGNOSIS — N390 Urinary tract infection, site not specified: Secondary | ICD-10-CM

## 2018-06-22 MED ORDER — SULFAMETHOXAZOLE-TRIMETHOPRIM 800-160 MG PO TABS: 1.0000 | ORAL_TABLET | Freq: Two times a day (BID) | ORAL | 0 refills | Status: AC

## 2018-06-22 NOTE — Telephone Encounter (Signed)
Made wife aware of script sent to pharmacy. Wife states patient had fever of 101 yesterday while still residing at [facility] that has since resolved. Patient is being discharged to home tomorrow. Advised wife to call if fever returns, otherwise start antibiotic 3 days prior to surgery. Wife voices understanding.

## 2018-06-22 NOTE — Telephone Encounter (Signed)
-----   Message from [name], MD sent at 06/22/2018  2:58 PM EDT ----- Please have this patient start taking Bactrim DS twice daily 3 days before his surgery.  He does not need to be treated otherwise, this is just to reduce his bacterial load prior to stent exchange.  [name], MD

## 2018-06-25 NOTE — Progress Notes (Signed)
   06/25/18  CC:  Chief Complaint  Patient presents with  . Cysto    HPI: 83 year old male with locally advanced bladder cancer, bilateral indwelling ureteral stents undergoing tri-modal therapy who presents today for cystoscopy for further evaluation of possible residual gross tumor.  Blood pressure 130/76, pulse 100. NED. A&Ox3.   No respiratory distress   Abd soft, NT, ND Normal phallus with bilateral descended testicles Significant candidal rash involving the perineum, inguinal folds and foreskin.  Cystoscopy Procedure Note  Patient identification was confirmed, informed consent was obtained, and patient was prepped using Betadine solution.  Lidocaine jelly was administered per urethral meatus.    Preoperative abx where received prior to procedure.     Pre-Procedure: - Inspection reveals a normal caliber ureteral meatus.  Procedure: The flexible cystoscope was introduced without difficulty - No urethral strictures/lesions are present. -Prostate was grossly abnormal with sloughing necrotic friable tissue, able to carefully navigate the scope into the bladder.  Bladder was debris-filled and visualization was suboptimal.  Bilateral ureteral stents were in place.  Bladder was irrigated several times to help clear the debris.  No obvious residual tumor was identified although visualization was very poor.  Post-Procedure: - Patient tolerated the procedure well  Assessment/ Plan:  1. Malignant neoplasm of overlapping sites of bladder [facility]) Case discussed with Dr. [name] No evidence of obvious tumor but visualization was suboptimal She will continue with chemo for the time being with reassessment when he is due for stent exchange - ciprofloxacin (CIPRO) tablet 500 mg - lidocaine (XYLOCAINE) 2 % jelly 1 application  2. Candida rash of groin Candidal rash of groin Nystatin powder ordered  Return in about 1 month (around 05/11/2018) for nurse cath exchange, f/u 2 months MD to  schedule stent exchange.

## 2018-06-26 DIAGNOSIS — Z8551 Personal history of malignant neoplasm of bladder: Secondary | ICD-10-CM | POA: Insufficient documentation

## 2018-06-26 DIAGNOSIS — Z862 Personal history of diseases of the blood and blood-forming organs and certain disorders involving the immune mechanism: Secondary | ICD-10-CM | POA: Insufficient documentation

## 2018-06-26 DIAGNOSIS — Z8781 Personal history of (healed) traumatic fracture: Secondary | ICD-10-CM | POA: Insufficient documentation

## 2018-06-27 DIAGNOSIS — D508 Other iron deficiency anemias: Secondary | ICD-10-CM | POA: Insufficient documentation

## 2018-06-27 DIAGNOSIS — E538 Deficiency of other specified B group vitamins: Secondary | ICD-10-CM | POA: Insufficient documentation

## 2018-07-08 DIAGNOSIS — C61 Malignant neoplasm of prostate: Secondary | ICD-10-CM

## 2018-07-08 DIAGNOSIS — K429 Umbilical hernia without obstruction or gangrene: Secondary | ICD-10-CM

## 2018-07-08 DIAGNOSIS — R234 Changes in skin texture: Secondary | ICD-10-CM

## 2018-07-08 DIAGNOSIS — I499 Cardiac arrhythmia, unspecified: Secondary | ICD-10-CM

## 2018-07-08 HISTORY — DX: Umbilical hernia without obstruction or gangrene: K42.9

## 2018-07-08 HISTORY — DX: Cardiac arrhythmia, unspecified: I49.9

## 2018-07-08 HISTORY — DX: Changes in skin texture: R23.4

## 2018-07-08 HISTORY — DX: Malignant neoplasm of prostate: C61

## 2018-07-12 ENCOUNTER — Inpatient Hospital Stay: Payer: Medicare Other | Attending: Oncology

## 2018-07-12 ENCOUNTER — Ambulatory Visit
Admission: RE | Admit: 2018-07-12 | Discharge: 2018-07-12 | Disposition: A | Discharge status: Home / Self Care | Payer: Medicare Other | Source: Ambulatory Visit | Attending: Oncology | Admitting: Oncology

## 2018-07-12 ENCOUNTER — Encounter
Admission: RE | Admit: 2018-07-12 | Discharge: 2018-07-12 | Disposition: A | Discharge status: Home / Self Care | Payer: Medicare Other | Source: Ambulatory Visit | Attending: Urology | Admitting: Urology

## 2018-07-12 ENCOUNTER — Other Ambulatory Visit: Payer: Self-pay

## 2018-07-12 DIAGNOSIS — J61 Pneumoconiosis due to asbestos and other mineral fibers: Secondary | ICD-10-CM | POA: Diagnosis not present

## 2018-07-12 DIAGNOSIS — N3289 Other specified disorders of bladder: Secondary | ICD-10-CM | POA: Insufficient documentation

## 2018-07-12 DIAGNOSIS — C678 Malignant neoplasm of overlapping sites of bladder: Secondary | ICD-10-CM | POA: Insufficient documentation

## 2018-07-12 DIAGNOSIS — I10 Essential (primary) hypertension: Secondary | ICD-10-CM | POA: Insufficient documentation

## 2018-07-12 DIAGNOSIS — C679 Malignant neoplasm of bladder, unspecified: Secondary | ICD-10-CM

## 2018-07-12 DIAGNOSIS — I700 Atherosclerosis of aorta: Secondary | ICD-10-CM | POA: Insufficient documentation

## 2018-07-12 DIAGNOSIS — I2510 Atherosclerotic heart disease of native coronary artery without angina pectoris: Secondary | ICD-10-CM | POA: Diagnosis not present

## 2018-07-12 DIAGNOSIS — L97529 Non-pressure chronic ulcer of other part of left foot with unspecified severity: Secondary | ICD-10-CM | POA: Insufficient documentation

## 2018-07-12 DIAGNOSIS — N1330 Unspecified hydronephrosis: Secondary | ICD-10-CM | POA: Insufficient documentation

## 2018-07-12 DIAGNOSIS — Z79899 Other long term (current) drug therapy: Secondary | ICD-10-CM | POA: Insufficient documentation

## 2018-07-12 DIAGNOSIS — Z5112 Encounter for antineoplastic immunotherapy: Secondary | ICD-10-CM | POA: Insufficient documentation

## 2018-07-12 DIAGNOSIS — K5900 Constipation, unspecified: Secondary | ICD-10-CM | POA: Insufficient documentation

## 2018-07-12 DIAGNOSIS — Z452 Encounter for adjustment and management of vascular access device: Secondary | ICD-10-CM | POA: Insufficient documentation

## 2018-07-12 DIAGNOSIS — Z87440 Personal history of urinary (tract) infections: Secondary | ICD-10-CM | POA: Insufficient documentation

## 2018-07-12 DIAGNOSIS — K219 Gastro-esophageal reflux disease without esophagitis: Secondary | ICD-10-CM | POA: Insufficient documentation

## 2018-07-12 DIAGNOSIS — R5383 Other fatigue: Secondary | ICD-10-CM | POA: Insufficient documentation

## 2018-07-12 DIAGNOSIS — R5381 Other malaise: Secondary | ICD-10-CM | POA: Insufficient documentation

## 2018-07-12 DIAGNOSIS — Z8546 Personal history of malignant neoplasm of prostate: Secondary | ICD-10-CM | POA: Insufficient documentation

## 2018-07-12 HISTORY — DX: Umbilical hernia without obstruction or gangrene: K42.9

## 2018-07-12 HISTORY — DX: Gastro-esophageal reflux disease without esophagitis: K21.9

## 2018-07-12 HISTORY — DX: Unspecified fracture of right femur, initial encounter for closed fracture: S72.91XA

## 2018-07-12 HISTORY — DX: Cardiac arrhythmia, unspecified: I49.9

## 2018-07-12 HISTORY — DX: Changes in skin texture: R23.4

## 2018-07-12 HISTORY — DX: Personal history of other medical treatment: Z92.89

## 2018-07-12 LAB — BASIC METABOLIC PANEL
ANION GAP: 7 (ref 5–15)
BUN: 15 mg/dL (ref 8–23)
CALCIUM: 9.5 mg/dL (ref 8.9–10.3)
CHLORIDE: 107 mmol/L (ref 98–111)
CO2: 28 mmol/L (ref 22–32)
CREATININE: 1.08 mg/dL (ref 0.61–1.24)
GFR calc non Af Amer: >60 mL/min (ref >60)
Glucose, Bld: 128 mg/dL — ABNORMAL HIGH (ref 70–99)
Potassium: 3.2 mmol/L — ABNORMAL LOW (ref 3.5–5.1)
SODIUM: 142 mmol/L (ref 135–145)

## 2018-07-12 LAB — CBC WITH DIFFERENTIAL/PLATELET
BASOS ABS: 0 10*3/uL (ref 0–0.1)
BASOS PCT: 0 %
EOS ABS: 0.1 10*3/uL (ref 0–0.7)
Eosinophils Relative: 1 %
HEMATOCRIT: 36.4 % — AB (ref 40.0–52.0)
HEMOGLOBIN: 12.5 g/dL — AB (ref 13.0–18.0)
Lymphocytes Relative: 17 %
Lymphs Abs: 1.8 10*3/uL (ref 1.0–3.6)
MCH: 30.9 pg (ref 26.0–34.0)
MCHC: 34.3 g/dL (ref 32.0–36.0)
MCV: 89.9 fL (ref 80.0–100.0)
Monocytes Absolute: 0.7 10*3/uL (ref 0.2–1.0)
Monocytes Relative: 7 %
NEUTROS PCT: 75 %
Neutro Abs: 7.7 10*3/uL — ABNORMAL HIGH (ref 1.4–6.5)
Platelets: 278 10*3/uL (ref 150–440)
RBC: 4.05 MIL/uL — ABNORMAL LOW (ref 4.40–5.90)
RDW: 17 % — AB (ref 11.5–14.5)
WBC: 10.3 10*3/uL (ref 3.8–10.6)

## 2018-07-12 LAB — POCT I-STAT CREATININE: CREATININE: 1.1 mg/dL (ref 0.61–1.24)

## 2018-07-12 MED ORDER — IOPAMIDOL (ISOVUE-300) INJECTION 61%
100.0000 mL | Freq: Once | INTRAVENOUS | Status: AC | PRN
  Administered 2018-07-12: 100 mL via INTRAVENOUS

## 2018-07-12 NOTE — Pre-Procedure Instructions (Signed)
Patient to P.A.T. Via wheelchair and accompanied by his wife.  Foley catheter intact draining cloudy urine. They state that they have antibiotics scheduled to take just prior to surgery

## 2018-07-12 NOTE — Patient Instructions (Signed)
Your procedure is scheduled on: Wednesday, July 18, 2018  Report to [facility]    DO NOT STOP ON THE FIRST FLOOR TO REGISTER  To find out your arrival time please call [phone] between 1PM - 3PM on Tuesday, July 17, 2018  Remember: Instructions that are not followed completely may result in serious medical risk,  up to and including death, or upon the discretion of your surgeon and anesthesiologist your  surgery may need to be rescheduled.     _X__ 1. Do not eat food after midnight the night before your procedure.                 No gum chewing or hard candies.                    ABSOLUTELY NOTHING SOLID IN YOUR MOUTH AFTER MIDNIGHT                  You may drink clear liquids up to 2 hours before you are scheduled to arrive for your surgery-                   DO not drink clear liquids within 2 hours of the start of your surgery.                  Clear Liquids include:  water, apple juice without pulp, clear carbohydrate                 drink such as Clearfast of Gatorade, Black Coffee or Tea (Do not add                 anything to coffee or tea).  __X__2.  On the morning of surgery brush your teeth with toothpaste and water,                   You may rinse your mouth with mouthwash if you wish.                       Do not swallow any toothpaste of mouthwash.     _X__ 3.  No Alcohol for 24 hours before or after surgery.   _X__ 4.  Do Not Smoke or use e-cigarettes For 24 Hours Prior to Your Surgery.                 Do not use any chewable tobacco products for at least 6 hours prior to                 surgery.  ____  5.  Bring all medications with you on the day of surgery if instructed.   ____  6.  Notify your doctor if there is any change in your medical condition      (cold, fever, infections).     Do not wear jewelry, make-up, hairpins, clips or nail polish. Do not wear lotions, powders, or perfumes. You may  wear deodorant. Do not shave 48 hours prior to surgery. Men may shave face and neck. Do not bring valuables to the hospital.    [facility] is not responsible for any belongings or valuables.  Contacts, dentures or bridgework may not be worn into surgery. Leave your suitcase in the car. After surgery it may be brought to your room. For patients admitted to the hospital, discharge time is determined by your treatment team.   Patients discharged the  day of surgery will not be allowed to drive home.   Please read over the following fact sheets that you were given:   PREPARING FOR YOUR SURGERY     ____ Take these medicines the morning of surgery with A SIP OF WATER:    1. ZYRTEC  2. MYRBETRIQ  3. PROTONIX (IF YOU HAVE RECEIVED THE PRESCRIPTION)  4. ANTIBIOTIC AS PRESCRIBED BY DR. Erlene Quan  5.  6.  ____ Fleet Enema (as directed)   __X__TAKE A SHOWER BEFORE COMING TO [facility]  ____ Use inhalers on the day of surgery  _X___ Stop Anti-inflammatories AS OF TODAY   __X__ Stop supplements until after surgery.                 STOP CRANBERRY PILLS UNTIL AFTER SURGERY           CONTINUE TAKING THE MULTIVITAMINS AND VITAMIN B12 AND FERROUS SULFATE BUT DO NOT TAKE ON THE MORNING                       OF SURGERY  ____ Bring C-Pap to the hospital.   [place].

## 2018-07-16 ENCOUNTER — Telehealth: Payer: Self-pay | Admitting: Radiology

## 2018-07-16 NOTE — Telephone Encounter (Signed)
-----   Message from [name], MD sent at 07/16/2018  9:15 AM EDT ----- No action ----- Message ----- From: Ranell Patrick, RN Sent: 07/16/2018   9:08 AM EDT To: Ranell Patrick, RN, [name], MD  Please note potassium level of 3.2 collected 07/12/2018 & advise. Surgery is scheduled 07/18/2018.

## 2018-07-17 MED ORDER — CIPROFLOXACIN IN D5W 400 MG/200ML IV SOLN
400.0000 mg | INTRAVENOUS | Status: AC
  Administered 2018-07-18: 400 mg via INTRAVENOUS

## 2018-07-18 ENCOUNTER — Encounter: Payer: Self-pay | Admitting: *Deleted

## 2018-07-18 ENCOUNTER — Other Ambulatory Visit: Payer: Self-pay

## 2018-07-18 ENCOUNTER — Encounter
Admission: RE | Disposition: A | Discharge status: Home / Self Care | Payer: Self-pay | Source: Ambulatory Visit | Attending: Urology

## 2018-07-18 ENCOUNTER — Ambulatory Visit: Payer: Medicare Other | Admitting: Anesthesiology

## 2018-07-18 ENCOUNTER — Ambulatory Visit
Admission: RE | Admit: 2018-07-18 | Discharge: 2018-07-18 | Disposition: A | Discharge status: Home / Self Care | Payer: Medicare Other | Source: Ambulatory Visit | Attending: Urology | Admitting: Urology

## 2018-07-18 DIAGNOSIS — F419 Anxiety disorder, unspecified: Secondary | ICD-10-CM | POA: Insufficient documentation

## 2018-07-18 DIAGNOSIS — C675 Malignant neoplasm of bladder neck: Secondary | ICD-10-CM | POA: Diagnosis not present

## 2018-07-18 DIAGNOSIS — N3020 Other chronic cystitis without hematuria: Secondary | ICD-10-CM | POA: Insufficient documentation

## 2018-07-18 DIAGNOSIS — Z880 Allergy status to penicillin: Secondary | ICD-10-CM | POA: Diagnosis not present

## 2018-07-18 DIAGNOSIS — C678 Malignant neoplasm of overlapping sites of bladder: Secondary | ICD-10-CM

## 2018-07-18 DIAGNOSIS — I10 Essential (primary) hypertension: Secondary | ICD-10-CM | POA: Diagnosis not present

## 2018-07-18 DIAGNOSIS — N135 Crossing vessel and stricture of ureter without hydronephrosis: Secondary | ICD-10-CM

## 2018-07-18 DIAGNOSIS — Z79899 Other long term (current) drug therapy: Secondary | ICD-10-CM | POA: Diagnosis not present

## 2018-07-18 DIAGNOSIS — Z923 Personal history of irradiation: Secondary | ICD-10-CM | POA: Diagnosis not present

## 2018-07-18 DIAGNOSIS — Z8546 Personal history of malignant neoplasm of prostate: Secondary | ICD-10-CM | POA: Diagnosis not present

## 2018-07-18 HISTORY — PX: TRANSURETHRAL RESECTION OF BLADDER TUMOR: SHX2575

## 2018-07-18 HISTORY — PX: CYSTOSCOPY W/ RETROGRADES: SHX1426

## 2018-07-18 HISTORY — PX: CYSTOSCOPY W/ URETERAL STENT PLACEMENT: SHX1429

## 2018-07-18 HISTORY — PX: CYSTOGRAM: SHX6285

## 2018-07-18 LAB — POCT I-STAT 4, (NA,K, GLUC, HGB,HCT)
Glucose, Bld: 112 mg/dL — ABNORMAL HIGH (ref 70–99)
HEMATOCRIT: 38 % — AB (ref 39.0–52.0)
HEMOGLOBIN: 12.9 g/dL — AB (ref 13.0–17.0)
Potassium: 3.3 mmol/L — ABNORMAL LOW (ref 3.5–5.1)
Sodium: 143 mmol/L (ref 135–145)

## 2018-07-18 SURGERY — CYSTOSCOPY, FLEXIBLE, WITH STENT REPLACEMENT
Anesthesia: General

## 2018-07-18 MED ORDER — KETOROLAC TROMETHAMINE 30 MG/ML IJ SOLN
INTRAMUSCULAR | Status: DC | PRN
  Administered 2018-07-18: 15 mg via INTRAVENOUS

## 2018-07-18 MED ORDER — DEXAMETHASONE SODIUM PHOSPHATE 10 MG/ML IJ SOLN
INTRAMUSCULAR | Status: DC | PRN
  Administered 2018-07-18: 5 mg via INTRAVENOUS

## 2018-07-18 MED ORDER — FENTANYL CITRATE (PF) 100 MCG/2ML IJ SOLN
INTRAMUSCULAR | Status: DC | PRN
  Administered 2018-07-18: 25 ug via INTRAVENOUS
  Administered 2018-07-18: 50 ug via INTRAVENOUS
  Administered 2018-07-18: 25 ug via INTRAVENOUS

## 2018-07-18 MED ORDER — IOPAMIDOL (ISOVUE-200) INJECTION 41%
INTRAVENOUS | Status: DC | PRN
  Administered 2018-07-18: 40 mL

## 2018-07-18 MED ORDER — ONDANSETRON HCL 4 MG/2ML IJ SOLN
INTRAMUSCULAR | Status: AC
  Filled 2018-07-18: qty 2

## 2018-07-18 MED ORDER — LACTATED RINGERS IV SOLN
INTRAVENOUS | Status: DC
  Administered 2018-07-18: 10:00:00 via INTRAVENOUS

## 2018-07-18 MED ORDER — FENTANYL CITRATE (PF) 100 MCG/2ML IJ SOLN: 25.0000 ug | INTRAMUSCULAR | Status: DC | PRN

## 2018-07-18 MED ORDER — FAMOTIDINE 20 MG PO TABS
ORAL_TABLET | ORAL | Status: AC
  Filled 2018-07-18: qty 1

## 2018-07-18 MED ORDER — ACETAMINOPHEN 325 MG PO TABS: 325.0000 mg | ORAL_TABLET | ORAL | Status: DC | PRN

## 2018-07-18 MED ORDER — MEPERIDINE HCL 50 MG/ML IJ SOLN: 6.2500 mg | INTRAMUSCULAR | Status: DC | PRN

## 2018-07-18 MED ORDER — PROMETHAZINE HCL 25 MG/ML IJ SOLN: 6.2500 mg | INTRAMUSCULAR | Status: DC | PRN

## 2018-07-18 MED ORDER — MIDAZOLAM HCL 2 MG/2ML IJ SOLN
INTRAMUSCULAR | Status: AC
  Filled 2018-07-18: qty 2

## 2018-07-18 MED ORDER — HYDROCODONE-ACETAMINOPHEN 7.5-325 MG PO TABS
1.0000 | ORAL_TABLET | Freq: Once | ORAL | Status: DC | PRN
  Filled 2018-07-18: qty 1

## 2018-07-18 MED ORDER — ACETAMINOPHEN 160 MG/5ML PO SOLN
325.0000 mg | ORAL | Status: DC | PRN
  Filled 2018-07-18: qty 20.3

## 2018-07-18 MED ORDER — PROPOFOL 10 MG/ML IV BOLUS
INTRAVENOUS | Status: AC
  Filled 2018-07-18: qty 20

## 2018-07-18 MED ORDER — CIPROFLOXACIN IN D5W 400 MG/200ML IV SOLN
INTRAVENOUS | Status: AC
  Filled 2018-07-18: qty 200

## 2018-07-18 MED ORDER — ONDANSETRON HCL 4 MG/2ML IJ SOLN
INTRAMUSCULAR | Status: DC | PRN
  Administered 2018-07-18: 4 mg via INTRAVENOUS

## 2018-07-18 MED ORDER — SEVOFLURANE IN SOLN
RESPIRATORY_TRACT | Status: AC
  Filled 2018-07-18: qty 250

## 2018-07-18 MED ORDER — FAMOTIDINE 20 MG PO TABS
20.0000 mg | ORAL_TABLET | Freq: Once | ORAL | Status: AC
  Administered 2018-07-18: 20 mg via ORAL

## 2018-07-18 MED ORDER — PROPOFOL 10 MG/ML IV BOLUS
INTRAVENOUS | Status: DC | PRN
  Administered 2018-07-18: 150 mg via INTRAVENOUS

## 2018-07-18 MED ORDER — DEXAMETHASONE SODIUM PHOSPHATE 10 MG/ML IJ SOLN
INTRAMUSCULAR | Status: AC
  Filled 2018-07-18: qty 1

## 2018-07-18 MED ORDER — SULFAMETHOXAZOLE-TRIMETHOPRIM 400-80 MG PO TABS: 1.0000 | ORAL_TABLET | Freq: Two times a day (BID) | ORAL | 0 refills | Status: DC

## 2018-07-18 MED ORDER — FENTANYL CITRATE (PF) 100 MCG/2ML IJ SOLN
INTRAMUSCULAR | Status: AC
  Filled 2018-07-18: qty 2

## 2018-07-18 MED ORDER — KETOROLAC TROMETHAMINE 30 MG/ML IJ SOLN
INTRAMUSCULAR | Status: AC
  Filled 2018-07-18: qty 1

## 2018-07-18 SURGICAL SUPPLY — 34 items
BAG DRAIN CYSTO-URO LG1000N (MISCELLANEOUS) ×5 IMPLANT
BAG URINE DRAINAGE (UROLOGICAL SUPPLIES) ×5 IMPLANT
BRUSH SCRUB EZ  4% CHG (MISCELLANEOUS) ×2
BRUSH SCRUB EZ 4% CHG (MISCELLANEOUS) ×3 IMPLANT
CATH COUDE FOLEY 2W 5CC 18FR (CATHETERS) ×2 IMPLANT
CATH FOLEY 2WAY  5CC 16FR (CATHETERS) ×2
CATH URETL 5X70 OPEN END (CATHETERS) ×3 IMPLANT
CATH URTH 16FR FL 2W BLN LF (CATHETERS) ×3 IMPLANT
DRAPE UTILITY 15X26 TOWEL STRL (DRAPES) ×5 IMPLANT
DRSG TELFA 4X3 1S NADH ST (GAUZE/BANDAGES/DRESSINGS) ×5 IMPLANT
ELECT LOOP 22F BIPOLAR SML (ELECTROSURGICAL) ×5
ELECT REM PT RETURN 9FT ADLT (ELECTROSURGICAL)
ELECTRODE LOOP 22F BIPOLAR SML (ELECTROSURGICAL) ×3 IMPLANT
ELECTRODE REM PT RTRN 9FT ADLT (ELECTROSURGICAL) IMPLANT
GLOVE BIO SURGEON STRL SZ 6.5 (GLOVE) ×4 IMPLANT
GLOVE BIO SURGEONS STRL SZ 6.5 (GLOVE) ×1
GOWN STRL REUS W/ TWL LRG LVL3 (GOWN DISPOSABLE) ×6 IMPLANT
GOWN STRL REUS W/TWL LRG LVL3 (GOWN DISPOSABLE) ×4
KIT TURNOVER CYSTO (KITS) ×5 IMPLANT
LOOP CUT BIPOLAR 24F LRG (ELECTROSURGICAL) IMPLANT
NDL SAFETY ECLIPSE 18X1.5 (NEEDLE) ×3 IMPLANT
NEEDLE HYPO 18GX1.5 SHARP (NEEDLE) ×2
PACK CYSTO AR (MISCELLANEOUS) ×5 IMPLANT
SENSORWIRE 0.038 NOT ANGLED (WIRE) ×5
SET CYSTO W/LG BORE CLAMP LF (SET/KITS/TRAYS/PACK) ×5 IMPLANT
SET IRRIG Y TYPE TUR BLADDER L (SET/KITS/TRAYS/PACK) ×5 IMPLANT
SET IRRIGATING DISP (SET/KITS/TRAYS/PACK) ×5 IMPLANT
SOL .9 NS 3000ML IRR  AL (IV SOLUTION) ×2
SOL .9 NS 3000ML IRR UROMATIC (IV SOLUTION) ×3 IMPLANT
STENT URO INLAY 6FRX26CM (STENTS) ×4 IMPLANT
SURGILUBE 2OZ TUBE FLIPTOP (MISCELLANEOUS) ×5 IMPLANT
SYRINGE IRR TOOMEY STRL 70CC (SYRINGE) ×5 IMPLANT
WATER STERILE IRR 1000ML POUR (IV SOLUTION) ×5 IMPLANT
WIRE SENSOR 0.038 NOT ANGLED (WIRE) ×3 IMPLANT

## 2018-07-18 NOTE — Progress Notes (Signed)
CATHETER EMPTIED OF 100CC BLOOD TINGED URINE   WIFE STATES UNDERSTANDING OF INSTRUCTIONS

## 2018-07-18 NOTE — Interval H&P Note (Signed)
History and Physical Interval Note:  07/18/2018 11:09 AM  Vincent Poole  has presented today for surgery, with the diagnosis of bladder cancer, bilateral ureteral obstruction  The various methods of treatment have been discussed with the patient and family. After consideration of risks, benefits and other options for treatment, the patient has consented to  Procedure(s): CYSTOSCOPY WITH STENT REPLACEMENT (exchange) (Bilateral) CYSTOSCOPY WITH RETROGRADE PYELOGRAM (Bilateral) TRANSURETHRAL RESECTION OF BLADDER TUMOR (TURBT) (N/A) as a surgical intervention .  The patient's history has been reviewed, patient examined, no change in status, stable for surgery.  I have reviewed the patient's chart and labs.  Questions were answered to the patient's satisfaction.    [facility] CTAB  [name]

## 2018-07-18 NOTE — Transfer of Care (Signed)
Immediate Anesthesia Transfer of Care Note  Patient: Vincent Poole  Procedure(s) Performed: CYSTOSCOPY WITH STENT REPLACEMENT (exchange) (Bilateral ) CYSTOSCOPY WITH RETROGRADE PYELOGRAM (Bilateral ) TRANSURETHRAL RESECTION OF BLADDER TUMOR (TURBT) (N/A ) CYSTOGRAM  Patient Location: PACU  Anesthesia Type:General  Level of Consciousness: drowsy and patient cooperative  Airway & Oxygen Therapy: Patient Spontanous Breathing and Patient connected to face mask oxygen  Post-op Assessment: Report given to RN and Post -op Vital signs reviewed and stable  Post vital signs: Reviewed and stable  Last Vitals:  Vitals Value Taken Time  BP 130/68 07/18/2018 12:23 PM  Temp 36.7 C 07/18/2018 12:23 PM  Pulse 65 07/18/2018 12:24 PM  Resp 14 07/18/2018 12:24 PM  SpO2 100 % 07/18/2018 12:24 PM  Vitals shown include unvalidated device data.  Last Pain:  Vitals:   07/18/18 0926  TempSrc: Tympanic  PainSc: 0-No pain         Complications: No apparent anesthesia complications

## 2018-07-18 NOTE — Anesthesia Post-op Follow-up Note (Signed)
Anesthesia QCDR form completed.        

## 2018-07-18 NOTE — Anesthesia Preprocedure Evaluation (Signed)
Anesthesia Evaluation  Patient identified by MRN, date of birth, ID band Patient awake    Reviewed: Allergy & Precautions, H&P , NPO status , reviewed documented beta blocker date and time   Airway Mallampati: II  TM Distance: >3 FB Neck ROM: full    Dental  (+) Poor Dentition, Chipped, Missing   Pulmonary    Pulmonary exam normal        Cardiovascular hypertension, Normal cardiovascular exam+ dysrhythmias Atrial Fibrillation      Neuro/Psych Anxiety  Neuromuscular disease    GI/Hepatic GERD  Controlled,  Endo/Other    Renal/GU      Musculoskeletal  (+) Arthritis ,   Abdominal   Peds  Hematology   Anesthesia Other Findings Past Medical History: No date: Anxiety No date: Arthritis No date: Bladder cancer ([place]) 07/2018: Dysrhythmia     Comment:  history of atrial flutter that worsens with anxiety 05/01/2018: Femur fracture, right ([place]) No date: GERD (gastroesophageal reflux disease) 05/2018: History of recent blood transfusion No date: Hypertension 07/2018: Prostate cancer ([place])     Comment:  cancer growing in prostate but not prostate cancer, it               is from the bladder 18/2993: Umbilical hernia 7169: Urinary retention     Comment:  foley catheter place 11/2017 2019: UTI (urinary tract infection)     Comment:  frequent UTI's over last year 07/2018: Wound eschar of foot     Comment:  left heal getting wrapped and requiring antibiotic               cream. cracks open with weight bearing. Past Surgical History: No date: CARPAL TUNNEL RELEASE; Right 2004: CHOLECYSTECTOMY 12/27/2017: CYSTOSCOPY W/ URETERAL STENT PLACEMENT; Bilateral     Comment:  Procedure: CYSTOSCOPY WITH RETROGRADE PYELOGRAM/URETERAL              STENT PLACEMENT;  Surgeon: [name], MD;                Location: [facility] ORS;  Service: Urology;  Laterality:               Bilateral; 05/02/2018: INTRAMEDULLARY (IM) NAIL  INTERTROCHANTERIC; Right     Comment:  Procedure: INTRAMEDULLARY (IM) NAIL INTERTROCHANTRIC;                Surgeon: [name], MD;  Location: [facility] ORS;                Service: Orthopedics;  Laterality: Right; 1958: LEG TENDON SURGERY; Right 01/22/2018: PORTA CATH INSERTION; N/A     Comment:  Procedure: PORTA CATH INSERTION;  Surgeon: [name],              MD;  Location: [facility];  Service:               Cardiovascular;  Laterality: N/A; 12/27/2017: TRANSURETHRAL RESECTION OF BLADDER TUMOR; N/A     Comment:  Procedure: TRANSURETHRAL RESECTION OF BLADDER TUMOR               (TURBT);  Surgeon: [name], MD;  Location: [facility]               ORS;  Service: Urology;  Laterality: N/A; 01/15/2018: TRANSURETHRAL RESECTION OF BLADDER TUMOR; N/A     Comment:  Procedure: TRANSURETHRAL RESECTION OF BLADDER TUMOR               (TURBT);  Surgeon: [name], MD;  Location: [facility]               ORS;  Service: Urology;  Laterality: N/A;  Need 2 hrs for              this case please BMI    Body Mass Index:  25.18 kg/m     Reproductive/Obstetrics                             Anesthesia Physical Anesthesia Plan  ASA: III  Anesthesia Plan: General   Post-op Pain Management:    Induction: Intravenous  PONV Risk Score and Plan: 2 and Ondansetron and Treatment may vary due to age or medical condition  Airway Management Planned: LMA  Additional Equipment:   Intra-op Plan:   Post-operative Plan: Extubation in OR  Informed Consent: I have reviewed the patients History and Physical, chart, labs and discussed the procedure including the risks, benefits and alternatives for the proposed anesthesia with the patient or authorized representative who has indicated his/her understanding and acceptance.   Dental Advisory Given  Plan Discussed with: CRNA  Anesthesia Plan Comments:         Anesthesia Quick Evaluation

## 2018-07-18 NOTE — Op Note (Signed)
Date of procedure: 07/18/18  Preoperative diagnosis:  1. Bladder cancer 2. Bilateral ureteral obstruction  Postoperative diagnosis:  1. Same as above 2. Stent encrustation  Procedure: 1. Cystoscopy 2. Bilateral ureteral stent exchange 3. Bladder neck biopsy 4. Cystogram  Surgeon: [name], MD  Anesthesia: General  Complications: None  Intraoperative findings: Debris-filled bladder, irrigated several times until cleared.  No obvious tumor and bladder but does have shaggy necrotic appearance especially at the bladder neck.  Prostatic fossa is grossly abnormal, necrotic and irregular without any papillary change.  Moderate stent encrustation.  Catheter placement confirmed with cystogram, small capacity bladder.  EBL: Minimal  Specimens: Bladder neck biopsy  Drains: 6 x 26 French double-J ureteral stent, Bard optima bilaterally  Indication: Vincent Poole is a 83 y.o. patient with muscle invasive bladder cancer he is not a surgical candidate who underwent tri-modal therapy who returns today for replacement of his bilateral ureteral stent as well as further diagnostic evaluation.  After reviewing the management options for treatment, he elected to proceed with the above surgical procedure(s). We have discussed the potential benefits and risks of the procedure, side effects of the proposed treatment, the likelihood of the patient achieving the goals of the procedure, and any potential problems that might occur during the procedure or recuperation. Informed consent has been obtained.  Description of procedure:  The patient was taken to the operating room and general anesthesia was induced.  The patient was placed in the dorsal lithotomy position, prepped and draped in the usual sterile fashion, and preoperative antibiotics were administered. A preoperative time-out was performed.  His previously placed Foley catheter was removed prior to prepping.  Upon advancing the scope into  the bladder, the prostatic fossa was noted to be irregular and shaggy.  The bladder was full of debris.  Was irrigated several times until he was able to be cleared for better visualization.  Bilateral ureteral stents were seen in place with moderate degree of encrustation.  Evaluation of the bladder revealed no obvious papillary tumor.  There is a shaggy somewhat necrotic appearance of the bladder neck in addition to the prostatic fossa.  At this point time, graspers were used to grasp the distal coil of the left ureteral stent and pulled the lower level the urethral meatus.  I then attempted to cannulate the stent but due to encrustation was unsuccessful.  I readvanced the scope and was able to successfully advance a sensor wire alongside of the stent up to level the renal pelvis.  The stent was then completely withdrawn.  A 6 x 26 French Bard Optima double-J ureteral stent was then advanced over the wire up to level the kidney.  The wires partially drawn until focal is noted both within the bladder as well as within the renal pelvis.  Attention was then turned to the right kidney.  The same exact procedure was used and a wire was snaked alongside of the ureteral stent which time the stent was removed and replaced with a Bard Optima 6 x 26 French double-J ureteral stent in satisfactory position.  Finally, there is one slightly more nodular area  all along the bladder neck at the 5 o'clock position concerning for possible viable tumor.  For diagnostic purposes cold cup biopsy forceps were used to biopsy this area passed off the field.  The base was fulgurated using Bugbee electrocautery.  Hemostasis was adequate.  I then replaced his Foley catheter with an 60 French coud catheter.  The catheter advanced  easily but upon irrigating the catheter, there was leakage around the catheter concerning for malpositioning of the catheter.  I then performed a cystogram by injecting approximately 100 cc through the catheter  at which time again leakage was noted around the catheter again but the bladder did feel slightly confirming the appropriate position of the bladder.  Notably, the bladder was quite contracted and fairly small capacity.  Patient was then clean and dry, repositioned in supine position, reversed anesthesia, taken the PACU in stable condition.  Plan: I will see the patient again in 3 months to discuss his next ureteral stent exchange.  We will likely change his stents slightly earlier due to his stent encrustation.  [name], MD     [name], M.D.

## 2018-07-18 NOTE — Discharge Instructions (Signed)
You have a ureteral stent in place.  This is a tube that extends from your kidney to your bladder.  This may cause urinary bleeding, burning with urination, and urinary frequency.  Please call our office or present to the ED if you develop fevers >101 or pain which is not able to be controlled with oral pain medications.  You may be given either Flomax and/ or ditropan to help with bladder spasms and stent pain in addition to pain medications.    [facility] [address], [address] [place], [place] [address] [phone] 

## 2018-07-18 NOTE — Anesthesia Procedure Notes (Signed)
Procedure Name: LMA Insertion Date/Time: 07/18/2018 11:30 AM Performed by: Jonna Clark, CRNA Pre-anesthesia Checklist: Patient identified, Patient being monitored, Timeout performed, Emergency Drugs available and Suction available Patient Re-evaluated:Patient Re-evaluated prior to induction Oxygen Delivery Method: Circle system utilized Preoxygenation: Pre-oxygenation with 100% oxygen Induction Type: IV induction Ventilation: Mask ventilation without difficulty LMA: LMA inserted LMA Size: 4.0 Tube type: Oral Number of attempts: 1 Placement Confirmation: positive ETCO2 and breath sounds checked- equal and bilateral Tube secured with: Tape Dental Injury: Teeth and Oropharynx as per pre-operative assessment

## 2018-07-20 LAB — SURGICAL PATHOLOGY

## 2018-07-24 ENCOUNTER — Telehealth: Payer: Self-pay | Admitting: Oncology

## 2018-07-24 NOTE — Anesthesia Postprocedure Evaluation (Signed)
Anesthesia Post Note  Patient: ISSAIC WELLIVER  Procedure(s) Performed: CYSTOSCOPY WITH STENT REPLACEMENT (exchange) (Bilateral ) CYSTOSCOPY WITH RETROGRADE PYELOGRAM (Bilateral ) TRANSURETHRAL RESECTION OF BLADDER TUMOR (TURBT) (N/A ) CYSTOGRAM  Patient location during evaluation: PACU Anesthesia Type: General Level of consciousness: awake and alert Pain management: pain level controlled Vital Signs Assessment: post-procedure vital signs reviewed and stable Respiratory status: spontaneous breathing, nonlabored ventilation, respiratory function stable and patient connected to nasal cannula oxygen Cardiovascular status: blood pressure returned to baseline and stable Postop Assessment: no apparent nausea or vomiting Anesthetic complications: no     Last Vitals:  Vitals:   07/18/18 1305 07/18/18 1336  BP: 123/78 126/86  Pulse: 81 89  Resp: 16   Temp: 36.5 C   SpO2: 100% 99%    Last Pain:  Vitals:   07/18/18 1336  TempSrc:   PainSc: 2                  Alphonsus Sias

## 2018-07-24 NOTE — Telephone Encounter (Signed)
Returned call to patient ref cancellation of appts. Patient wife called to cancel 07/26/18 appts, due to patient has BROKEN HIP. Appts cancelled as requested.  Msg sent to Dr. [name] and team.

## 2018-07-26 ENCOUNTER — Inpatient Hospital Stay (HOSPITAL_BASED_OUTPATIENT_CLINIC_OR_DEPARTMENT_OTHER): Payer: Medicare Other | Admitting: Oncology

## 2018-07-26 ENCOUNTER — Inpatient Hospital Stay: Payer: Medicare Other

## 2018-07-26 ENCOUNTER — Encounter: Payer: Self-pay | Admitting: Oncology

## 2018-07-26 ENCOUNTER — Other Ambulatory Visit: Payer: Self-pay | Admitting: Urology

## 2018-07-26 ENCOUNTER — Inpatient Hospital Stay: Payer: Medicare Other | Admitting: Oncology

## 2018-07-26 VITALS — BP 122/81 | HR 112 | Temp 98.1°F | Resp 18 | Ht 71.0 in

## 2018-07-26 DIAGNOSIS — K219 Gastro-esophageal reflux disease without esophagitis: Secondary | ICD-10-CM | POA: Diagnosis not present

## 2018-07-26 DIAGNOSIS — Z5112 Encounter for antineoplastic immunotherapy: Secondary | ICD-10-CM

## 2018-07-26 DIAGNOSIS — I10 Essential (primary) hypertension: Secondary | ICD-10-CM | POA: Diagnosis not present

## 2018-07-26 DIAGNOSIS — Z7189 Other specified counseling: Secondary | ICD-10-CM

## 2018-07-26 DIAGNOSIS — C678 Malignant neoplasm of overlapping sites of bladder: Secondary | ICD-10-CM

## 2018-07-26 DIAGNOSIS — R5381 Other malaise: Secondary | ICD-10-CM

## 2018-07-26 DIAGNOSIS — Z79899 Other long term (current) drug therapy: Secondary | ICD-10-CM | POA: Diagnosis not present

## 2018-07-26 DIAGNOSIS — L97529 Non-pressure chronic ulcer of other part of left foot with unspecified severity: Secondary | ICD-10-CM | POA: Diagnosis not present

## 2018-07-26 DIAGNOSIS — Z87440 Personal history of urinary (tract) infections: Secondary | ICD-10-CM

## 2018-07-26 DIAGNOSIS — N1330 Unspecified hydronephrosis: Secondary | ICD-10-CM

## 2018-07-26 DIAGNOSIS — Z8546 Personal history of malignant neoplasm of prostate: Secondary | ICD-10-CM | POA: Diagnosis not present

## 2018-07-26 DIAGNOSIS — K5900 Constipation, unspecified: Secondary | ICD-10-CM | POA: Diagnosis not present

## 2018-07-26 DIAGNOSIS — R5383 Other fatigue: Secondary | ICD-10-CM

## 2018-07-26 DIAGNOSIS — C679 Malignant neoplasm of bladder, unspecified: Secondary | ICD-10-CM

## 2018-07-26 DIAGNOSIS — Z95828 Presence of other vascular implants and grafts: Secondary | ICD-10-CM

## 2018-07-26 DIAGNOSIS — Z452 Encounter for adjustment and management of vascular access device: Secondary | ICD-10-CM | POA: Diagnosis not present

## 2018-07-26 LAB — COMPREHENSIVE METABOLIC PANEL
ALT: 10 U/L (ref 0–44)
AST: 15 U/L (ref 15–41)
Albumin: 3.7 g/dL (ref 3.5–5.0)
Alkaline Phosphatase: 69 U/L (ref 38–126)
Anion gap: 8 (ref 5–15)
BUN: 28 mg/dL — AB (ref 8–23)
CO2: 24 mmol/L (ref 22–32)
CREATININE: 1.09 mg/dL (ref 0.61–1.24)
Calcium: 9.6 mg/dL (ref 8.9–10.3)
Chloride: 107 mmol/L (ref 98–111)
GFR calc Af Amer: >60 mL/min (ref >60)
Glucose, Bld: 130 mg/dL — ABNORMAL HIGH (ref 70–99)
POTASSIUM: 3.7 mmol/L (ref 3.5–5.1)
SODIUM: 139 mmol/L (ref 135–145)
TOTAL PROTEIN: 7.4 g/dL (ref 6.5–8.1)
Total Bilirubin: 0.3 mg/dL (ref 0.3–1.2)

## 2018-07-26 LAB — CBC WITH DIFFERENTIAL/PLATELET
BASOS ABS: 0.1 10*3/uL (ref 0–0.1)
Basophils Relative: 1 %
EOS ABS: 0.2 10*3/uL (ref 0–0.7)
EOS PCT: 1 %
HCT: 36.6 % — ABNORMAL LOW (ref 40.0–52.0)
Hemoglobin: 12.2 g/dL — ABNORMAL LOW (ref 13.0–18.0)
Lymphocytes Relative: 23 %
Lymphs Abs: 3.1 10*3/uL (ref 1.0–3.6)
MCH: 30.2 pg (ref 26.0–34.0)
MCHC: 33.5 g/dL (ref 32.0–36.0)
MCV: 90.3 fL (ref 80.0–100.0)
Monocytes Absolute: 0.8 10*3/uL (ref 0.2–1.0)
Monocytes Relative: 6 %
Neutro Abs: 9.6 10*3/uL — ABNORMAL HIGH (ref 1.4–6.5)
Neutrophils Relative %: 69 %
PLATELETS: 358 10*3/uL (ref 150–440)
RBC: 4.05 MIL/uL — AB (ref 4.40–5.90)
RDW: 17.8 % — ABNORMAL HIGH (ref 11.5–14.5)
WBC: 13.8 10*3/uL — AB (ref 3.8–10.6)

## 2018-07-26 MED ORDER — HEPARIN SOD (PORK) LOCK FLUSH 100 UNIT/ML IV SOLN
500.0000 [IU] | Freq: Once | INTRAVENOUS | Status: AC
  Administered 2018-07-26: 500 [IU] via INTRAVENOUS
  Filled 2018-07-26: qty 5

## 2018-07-26 MED ORDER — SODIUM CHLORIDE 0.9% FLUSH
10.0000 mL | Freq: Once | INTRAVENOUS | Status: AC
  Administered 2018-07-26: 10 mL via INTRAVENOUS
  Filled 2018-07-26: qty 10

## 2018-07-26 NOTE — Progress Notes (Signed)
Pt just got stents and foley changed out last week, sees orthopedic md next week. Every time he stands on left heel it opens up and prevents him from walking

## 2018-07-27 NOTE — Progress Notes (Signed)
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 For his prostate cancer he has received IM RT in the past. He has been seeing Dr. Erlene Quan in the past for his recurrent UTIs as well as hematuria. CT scan in July 2018 showed bladder wall thickening with perivascular edema and inflammation in the right ureter greater than the left. Findings were thought to be due to pyelonephritis at that time. He underwent cystoscopy on 12/14/2017 which showed abnormal looking prostate with necrotic material lining the entire surface area. Diffuse copious debris within the bladder appearing to be erythematous without discrete bladder tumor but visualization was poor. He was then admitted to the hospital on 12/26/2017 with symptoms of UTI and sepsis as well as new moderate bilateral hydronephrosis.  CT abdomen and pelvis with contrast on 12/20/2017 again showed market bile bladder wall thickening with perivesicular edema. Within the lumen of the bladder there is a 93.9 cm soft tissue attenuating filling defect. This is indeterminate and could represent an area of blood clot versus urothelial lesion.  He underwent repeat cystoscopy with bilateral pyelogram and ureteral stent placement as well as TURBT and TURP. He was found to have a massive tumor involving the majority of the bladder with grossly necrotic and calcified material. There appeared to be multifocal disease  with large burden of the left anterior bladder wall extending posteriorly as well as adjacent to the bladder neck and beyond the right hemitrigone. Very little normal recognizable bladder mucosa remaining.   Biopsy from TURBT and TURP showed: High-grade urothelial carcinoma with extensive necrosisinvolving both the bladder and the prostate.CT chest did not reveal any evidence of metastatic disease. He was also not found to have any regional adenopathy on CT abdomen  Patient was seen by Dr. Erlene Quan and was not deemed to be a surgical candidate. He has been referred to [place] for definitive treatment options.  Patient lives with his wife at home and ambulates with a cane. He does need assistance with his ADLs to some extent. He has not had any falls and he denies any changes in his appetite or unintentional weight loss. Denies any pain. Reports some fatigue and occasional problems with constipation. He has had 3 hospitalizations last year for urinary tract infections and currently has a chronic Foley for the last 1 month  Dr. Erlene Quan performed interval TURBT on 01/15/18 and was able to debulk tumor as much as possible to reduce tumor burden  Patient received 5 cycles of carboplatin/ gemcitabine 2 weeks on and 1 week off ending 04/26/18. Patient did receive radiation for 10 days during cycle 2 of treatment. Given patients age, co-morbidities and frailty- he was not a cisplatin candidate.6th cycle not given due to fall and hip fracture  Patient had a repeat cystoscopy in September 2019 which showed no obvious tumor bladder but it did have a shaggy necrotic appearance at the bladder neck.  Prostatic fossa was grossly abnormal necrotic and irregular without papillary change.  Bladder neck was biopsied and showed residual high-grade urothelial carcinoma.  Muscularis propria was not seen in that specimen   Interval history-he is recovering well from his hip fracture.  He does have an open ulcer  in his left foot which is currently being monitored by wound care.  He is finding it difficult to ambulate because of the ulcer.  He has not had any other recent falls.  His appetite is good and he has gained some weight.  He has not had any recurrent infections recently  ECOG PS- 2 Pain scale- 0 Opioid associated constipation- no  Review of systems- Review of Systems  Constitutional: Positive for malaise/fatigue. Negative for chills, fever and weight loss.  HENT: Negative for congestion, ear discharge and nosebleeds.   Eyes: Negative for blurred vision.  Respiratory: Negative for cough, hemoptysis, sputum production, shortness of breath and wheezing.   Cardiovascular: Negative for chest pain, palpitations, orthopnea and claudication.  Gastrointestinal: Negative for abdominal pain, blood in stool, constipation, diarrhea, heartburn, melena, nausea and vomiting.  Genitourinary: Negative for dysuria, flank pain, frequency, hematuria and urgency.  Musculoskeletal: Negative for back pain, joint pain and myalgias.       Left foot pain  Skin: Negative for rash.  Neurological: Negative for dizziness, tingling, focal weakness, seizures, weakness and headaches.  Endo/Heme/Allergies: Does not bruise/bleed easily.  Psychiatric/Behavioral: Negative for depression and suicidal ideas. The patient does not have insomnia.       Allergies  Allergen Reactions  . Penicillins Other (See Comments)    Caused nervousness as a child. Patient states he took as a teenager w/o problems Has patient had a PCN reaction causing immediate rash, facial/tongue/throat swelling, SOB or lightheadedness with hypotension: No Has patient had a PCN reaction causing severe rash involving mucus membranes or skin necrosis: No Has patient had a PCN reaction that required hospitalization: No Has patient had a PCN reaction occurring within the last 10 years: No If all of the above answers are "NO", then may proceed with     Past  Medical History:  Diagnosis Date  . Anxiety   . Arthritis   . Bladder cancer ([place])   . Dysrhythmia 07/2018   history of atrial flutter that worsens with anxiety  . Femur fracture, right ([place]) 05/01/2018  . GERD (gastroesophageal reflux disease)   . History of recent blood transfusion 05/2018  . Hypertension   . Prostate cancer ([place]) 07/2018   cancer growing in prostate but not prostate cancer, it is from the bladder  . Umbilical hernia 85/6314  . Urinary retention 2019   foley catheter place 11/2017  . UTI (urinary tract infection) 2019   frequent UTI's over last year  . Wound eschar of foot 07/2018   left heal getting wrapped and requiring antibiotic cream. cracks open with weight bearing.     Past Surgical History:  Procedure Laterality Date  . CARPAL TUNNEL RELEASE Right   . CHOLECYSTECTOMY  2004  . CYSTOGRAM  07/18/2018   Procedure: CYSTOGRAM;  Surgeon: [name], MD;  Location: [facility];  Service: Urology;;  . Consuela Mimes W/ RETROGRADES Bilateral 07/18/2018   Procedure: CYSTOSCOPY WITH RETROGRADE PYELOGRAM;  Surgeon: [name], MD;  Location: [facility];  Service: Urology;  Laterality: Bilateral;  . CYSTOSCOPY W/ URETERAL STENT PLACEMENT Bilateral 12/27/2017   Procedure: CYSTOSCOPY WITH RETROGRADE PYELOGRAM/URETERAL STENT PLACEMENT;  Surgeon: [name], MD;  Location: [facility];  Service: Urology;  Laterality: Bilateral;  . CYSTOSCOPY W/ URETERAL STENT PLACEMENT Bilateral 07/18/2018   Procedure: CYSTOSCOPY WITH STENT REPLACEMENT (exchange);  Surgeon: [name], MD;  Location: [facility];  Service: Urology;  Laterality: Bilateral;  . INTRAMEDULLARY (IM) NAIL INTERTROCHANTERIC Right 05/02/2018   Procedure: INTRAMEDULLARY (IM) NAIL INTERTROCHANTRIC;  Surgeon: [name], MD;  Location: [facility];  Service: Orthopedics;  Laterality: Right;  . LEG TENDON SURGERY Right 1958  . PORTA CATH INSERTION N/A 01/22/2018   Procedure: PORTA CATH INSERTION;  Surgeon: [name], MD;  Location: [place] [facility];  Service: Cardiovascular;  Laterality: N/A;  . TRANSURETHRAL RESECTION OF BLADDER TUMOR N/A 12/27/2017   Procedure: TRANSURETHRAL RESECTION OF BLADDER TUMOR (TURBT);  Surgeon: [name], MD;  Location: [facility];  Service: Urology;  Laterality: N/A;  . TRANSURETHRAL RESECTION OF BLADDER TUMOR N/A 01/15/2018   Procedure: TRANSURETHRAL RESECTION OF BLADDER TUMOR (TURBT);  Surgeon: [name], MD;  Location: [facility];  Service: Urology;  Laterality: N/A;  Need 2 hrs for this case please  . TRANSURETHRAL RESECTION OF BLADDER TUMOR N/A 07/18/2018   Procedure: TRANSURETHRAL RESECTION OF BLADDER TUMOR (TURBT);  Surgeon: [name], MD;  Location: [facility];  Service: Urology;  Laterality: N/A;    Social History   Socioeconomic History  . Marital status: Married    Spouse name: ruth  . Number of children: Not on file  . Years of education: Not on file  . Highest education level: Not on file  Occupational History  . Occupation: retired    Comment: Therapist, [employer]  Social Needs  . Financial resource strain: Not on file  . Food insecurity:    Worry: Not on file    Inability: Not on file  . Transportation needs:    Medical: Not on file    Non-medical: Not on file  Tobacco Use  . Smoking status: Never Smoker  . Smokeless tobacco: Never Used  Substance and Sexual Activity  . Alcohol use: No    Alcohol/week: 0.0 standard drinks  . Drug use: No  . Sexual activity: Not Currently  Lifestyle  . Physical activity:    Days per week: Not on file    Minutes per session: Not on file  . Stress: Not on file  Relationships  . Social connections:    Talks on phone: Not on file    Gets together: Not on file    Attends religious service: Not on file    Active member of club or organization: Not on file    Attends meetings of clubs or organizations: Not on file    Relationship status: Not on file  . Intimate partner violence:    Fear of  current or ex partner: Not on file    Emotionally abused: Not on file    Physically abused: Not on file    Forced sexual activity: Not on file  Other Topics Concern  . Not on file  Social History Narrative  . Not on file    Family History  Problem Relation Age of Onset  . Cancer Mother   . Chronic Renal Failure Mother   . Heart disease Father      Current Outpatient Medications:  .  acetaminophen (TYLENOL) 500 MG tablet, Take 1,000 mg by mouth every 6 (six) hours as needed (for pain.)., Disp: , Rfl:  .  cetirizine (ZYRTEC) 10 MG tablet, Take 10 mg by mouth daily., Disp: , Rfl:  .  CRANBERRY PO, Take 750 mg by mouth 2 (two) times daily. , Disp: , Rfl:  .  docusate sodium (COLACE) 100 MG capsule, Take 1 capsule (100 mg total) by  mouth 2 (two) times daily. (Patient taking differently: Take 100 mg by mouth daily as needed. ), Disp: 10 capsule, Rfl: 0 .  ferrous sulfate 325 (65 FE) MG tablet, Take 1 tablet (325 mg total) by mouth 2 (two) times daily with a meal. (Patient taking differently: Take 325 mg by mouth daily with breakfast. ), Disp: , Rfl: 3 .  mirabegron ER (MYRBETRIQ) 50 MG TB24 tablet, Take 1 tablet (50 mg total) by mouth daily., Disp: 30 tablet, Rfl: 3 .  Multiple Vitamin (MULTIVITAMIN WITH MINERALS) TABS tablet, Take 1 tablet by mouth daily., Disp: , Rfl:  .  polyethylene glycol powder (MIRALAX) powder, Take 17 g by mouth daily as needed. Can increase to 3 times a day as needed for constipation but hold medication if has diarrhea, Disp: 255 g, Rfl: 0 .  vitamin B-12 1000 MCG tablet, Take 1 tablet (1,000 mcg total) by mouth daily. (Patient taking differently: Take 1,000 mcg by mouth every evening. ), Disp: , Rfl:  .  metoCLOPramide (REGLAN) 5 MG tablet, Take 1-2 tablets (5-10 mg total) by mouth every 8 (eight) hours as needed for nausea (if ondansetron (ZOFRAN) ineffective.). (Patient not taking: Reported on 07/06/2018), Disp: , Rfl:  .  ondansetron (ZOFRAN) 4 MG tablet, Take 1  tablet (4 mg total) by mouth every 6 (six) hours as needed for nausea. (Patient not taking: Reported on 07/12/2018), Disp: 20 tablet, Rfl: 0 .  oxyCODONE (OXY IR/ROXICODONE) 5 MG immediate release tablet, Take 1-2 tablets (5-10 mg total) by mouth every 4 (four) hours as needed for moderate pain (pain score 4-6). (Patient not taking: Reported on 07/06/2018), Disp: 30 tablet, Rfl: 0 .  pantoprazole (PROTONIX) 40 MG tablet, Take 1 tablet (40 mg total) by mouth daily. (Patient not taking: Reported on 07/26/2018), Disp: , Rfl:   Physical exam:  Vitals:   07/26/18 1435  BP: 122/81  Pulse: (!) 112  Resp: 18  Temp: 98.1 F (36.7 C)  TempSrc: Oral  Height: 5\' 11"  (1.803 m)   Physical Exam  Constitutional: He is oriented to person, place, and time.  Elderly frail gentleman sitting in a  Wheelchair. He appears in better spirits  HENT:  Head: Normocephalic and atraumatic.  Eyes: Pupils are equal, round, and reactive to light. EOM are normal.  Neck: Normal range of motion.  Cardiovascular: Normal rate, regular rhythm and normal heart sounds.  Pulmonary/Chest: Effort normal and breath sounds normal.  Abdominal: Soft. Bowel sounds are normal.  Foley in place draining clear urine  Neurological: He is alert and oriented to person, place, and time.  Skin: Skin is warm and dry.     CMP Latest Ref Rng & Units 07/26/2018  Glucose 70 - 99 mg/dL 130(H)  BUN 8 - 23 mg/dL 28(H)  Creatinine 0.61 - 1.24 mg/dL 1.09  Sodium 135 - 145 mmol/L 139  Potassium 3.5 - 5.1 mmol/L 3.7  Chloride 98 - 111 mmol/L 107  CO2 22 - 32 mmol/L 24  Calcium 8.9 - 10.3 mg/dL 9.6  Total Protein 6.5 - 8.1 g/dL 7.4  Total Bilirubin 0.3 - 1.2 mg/dL 0.3  Alkaline Phos 38 - 126 U/L 69  AST 15 - 41 U/L 15  ALT 0 - 44 U/L 10   CBC Latest Ref Rng & Units 07/26/2018  WBC 3.8 - 10.6 K/uL 13.8(H)  Hemoglobin 13.0 - 18.0 g/dL 12.2(L)  Hematocrit 40.0 - 52.0 % 36.6(L)  Platelets 150 - 440 K/uL 358    No images are attached to  the  encounter.  Ct Chest W Contrast  Result Date: 07/13/2018 CLINICAL DATA:  Restaging bladder and prostate cancer. Status post chemotherapy in June. EXAM: CT CHEST, ABDOMEN, AND PELVIS WITH CONTRAST TECHNIQUE: Multidetector CT imaging of the chest, abdomen and pelvis was performed following the standard protocol during bolus administration of intravenous contrast. CONTRAST:  134mL ISOVUE-300 IOPAMIDOL (ISOVUE-300) INJECTION 61% COMPARISON:  CT cap 04/04/2018 FINDINGS: CT CHEST FINDINGS Cardiovascular: Heart size appears normal. Aortic atherosclerosis. Calcifications within the LAD, left main, left circumflex and RCA coronary arteries noted. No pericardial effusion. Mediastinum/Nodes: Normal appearance of the thyroid gland. The trachea appears patent and is midline. Normal appearance of the esophagus. No enlarged mediastinal or hilar lymph nodes. Lungs/Pleura: No pleural effusion. No airspace consolidation, atelectasis or pneumothorax. Posterior right upper lobe scarring appears unchanged from previous exam, image 35/4. Subpleural calcification overlying the anterior left upper lobe unchanged, image 75/4. No suspicious pulmonary nodules. Musculoskeletal: No chest wall mass or suspicious bone lesions identified. CT ABDOMEN PELVIS FINDINGS Hepatobiliary: No suspicious liver lesions. Status post cholecystectomy. No biliary dilatation. Pancreas: Scattered pancreatic calcifications compatible with chronic pancreatitis. No pancreatic ductal dilatation or surrounding inflammatory changes. Spleen: Normal in size without focal abnormality. Adrenals/Urinary Tract: Normal appearance of the adrenal glands. Unchanged appearance of kidney cysts. Bilateral ureteral stents are in place. Persistent right-sided hydronephrosis. Dilated right renal pelvis measures 3.6 cm, image 79/2. Increased from 3 cm previously. Left-sided pelvocaliectasis is noted. Mildly dilated left renal pelvis measures 1.5 cm, image 76/2. Previously 1.7 cm. The  bladder is collapsed around a Foley catheter balloon. Unchanged appearance of diffuse bladder wall thickening, image 116/2. Stomach/Bowel: Stomach is normal. The small bowel loops have a normal course and caliber without obstruction. The appendix is visualized and appears normal. Distal colonic diverticula noted without acute inflammation. No pathologic dilatation of the colon. Vascular/Lymphatic: Aortic atherosclerosis. No aneurysm. No enlarged abdominal or pelvic adenopathy. No inguinal adenopathy. Reproductive: Seed implants identified within the prostate gland. Other: No abdominal wall hernia or abnormality. No abdominopelvic ascites. Musculoskeletal: Previous ORIF of the right proximal femur. Advanced bilateral hip osteoarthritis noted. Spondylosis identified within the thoracic spine. The bones appear diffusely osteopenic. No aggressive lytic or sclerotic bone lesions. IMPRESSION: 1. Persistent bilateral hydronephrosis, right greater than left. Double-J ureteral stents in place. 2. No findings identified to suggest metastatic disease. 3. Unchanged appearance of diffuse bladder wall thickening. 4. Aortic Atherosclerosis (ICD10-I70.0). Coronary artery atherosclerotic calcifications noted. 5. Asbestos related pleural disease. Electronically Signed   By: Kerby Moors M.D.   On: 07/13/2018 10:38   Ct Abdomen Pelvis W Contrast  Result Date: 07/13/2018 CLINICAL DATA:  Restaging bladder and prostate cancer. Status post chemotherapy in June. EXAM: CT CHEST, ABDOMEN, AND PELVIS WITH CONTRAST TECHNIQUE: Multidetector CT imaging of the chest, abdomen and pelvis was performed following the standard protocol during bolus administration of intravenous contrast. CONTRAST:  133mL ISOVUE-300 IOPAMIDOL (ISOVUE-300) INJECTION 61% COMPARISON:  CT cap 04/04/2018 FINDINGS: CT CHEST FINDINGS Cardiovascular: Heart size appears normal. Aortic atherosclerosis. Calcifications within the LAD, left main, left circumflex and RCA  coronary arteries noted. No pericardial effusion. Mediastinum/Nodes: Normal appearance of the thyroid gland. The trachea appears patent and is midline. Normal appearance of the esophagus. No enlarged mediastinal or hilar lymph nodes. Lungs/Pleura: No pleural effusion. No airspace consolidation, atelectasis or pneumothorax. Posterior right upper lobe scarring appears unchanged from previous exam, image 35/4. Subpleural calcification overlying the anterior left upper lobe unchanged, image 75/4. No suspicious pulmonary nodules. Musculoskeletal: No chest wall mass or suspicious  bone lesions identified. CT ABDOMEN PELVIS FINDINGS Hepatobiliary: No suspicious liver lesions. Status post cholecystectomy. No biliary dilatation. Pancreas: Scattered pancreatic calcifications compatible with chronic pancreatitis. No pancreatic ductal dilatation or surrounding inflammatory changes. Spleen: Normal in size without focal abnormality. Adrenals/Urinary Tract: Normal appearance of the adrenal glands. Unchanged appearance of kidney cysts. Bilateral ureteral stents are in place. Persistent right-sided hydronephrosis. Dilated right renal pelvis measures 3.6 cm, image 79/2. Increased from 3 cm previously. Left-sided pelvocaliectasis is noted. Mildly dilated left renal pelvis measures 1.5 cm, image 76/2. Previously 1.7 cm. The bladder is collapsed around a Foley catheter balloon. Unchanged appearance of diffuse bladder wall thickening, image 116/2. Stomach/Bowel: Stomach is normal. The small bowel loops have a normal course and caliber without obstruction. The appendix is visualized and appears normal. Distal colonic diverticula noted without acute inflammation. No pathologic dilatation of the colon. Vascular/Lymphatic: Aortic atherosclerosis. No aneurysm. No enlarged abdominal or pelvic adenopathy. No inguinal adenopathy. Reproductive: Seed implants identified within the prostate gland. Other: No abdominal wall hernia or abnormality. No  abdominopelvic ascites. Musculoskeletal: Previous ORIF of the right proximal femur. Advanced bilateral hip osteoarthritis noted. Spondylosis identified within the thoracic spine. The bones appear diffusely osteopenic. No aggressive lytic or sclerotic bone lesions. IMPRESSION: 1. Persistent bilateral hydronephrosis, right greater than left. Double-J ureteral stents in place. 2. No findings identified to suggest metastatic disease. 3. Unchanged appearance of diffuse bladder wall thickening. 4. Aortic Atherosclerosis (ICD10-I70.0). Coronary artery atherosclerotic calcifications noted. 5. Asbestos related pleural disease. Electronically Signed   By: Kerby Moors M.D.   On: 07/13/2018 10:38     Assessment and plan- Patient is a 83 y.o. 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 I have reviewed CT chest abdomen pelvis images independently and discussed findings with patient and his wife. There is no evidence of metastatic disease.  However I am concerned that patient may develop metastatic disease given that he still has residual tumor at the bladder neck.  I would therefore like to proceed with Tecentriq 120 mg IV every 3 weeks until progression or toxicity for locally advanced bladder cancer after platinum based chemotherapy.  Discussed risks and benefits of Tecentriq including all but not limited to diarrhea, pneumonitis, need to monitor kidney liver and thyroid functions, fatigue.  Patient understands and agrees to proceed as planned.  He will directly proceed with cycle 1 of Tecentriq next week and I will see him back in 2 weeks time with CBC and CMP.  Treatment will be given with the palliative intent   Total face to face encounter time  for this patient visit was 30 min. >50% of the time was  spent in counseling and coordination of care.     Visit Diagnosis 1. Goals of care, counseling/discussion   2. Malignant neoplasm of overlapping sites of bladder ([place])   3. Encounter for antineoplastic immunotherapy      Dr. Randa Evens, MD, MPH [facility] at [facility] [phone] 07/27/2018 8:30 AM

## 2018-07-31 ENCOUNTER — Other Ambulatory Visit: Payer: Self-pay | Admitting: Oncology

## 2018-07-31 NOTE — Progress Notes (Signed)
START OFF PATHWAY REGIMEN - Bladder   [id number]:Atezolizumab 1,200 mg q21 Days:   A cycle is every 21 days:     Atezolizumab   **Always confirm dose/schedule in your pharmacy ordering system**  Patient Characteristics: Pre Cystectomy, Clinical T4b, Any N, M0 AJCC M Category: M0 AJCC N Category: N0 AJCC T Category: T4 Current evidence of distant metastases<= No AJCC 8 Stage Grouping: Unknown Intent of Therapy: Non-Curative / Palliative Intent, Discussed with Patient

## 2018-08-02 ENCOUNTER — Inpatient Hospital Stay: Payer: Medicare Other

## 2018-08-02 VITALS — BP 133/70 | HR 94 | Temp 97.9°F | Resp 20

## 2018-08-02 DIAGNOSIS — C678 Malignant neoplasm of overlapping sites of bladder: Secondary | ICD-10-CM | POA: Diagnosis not present

## 2018-08-02 LAB — TSH: TSH: 2.592 u[IU]/mL (ref 0.350–4.500)

## 2018-08-02 MED ORDER — HEPARIN SOD (PORK) LOCK FLUSH 100 UNIT/ML IV SOLN
500.0000 [IU] | Freq: Once | INTRAVENOUS | Status: AC | PRN
  Administered 2018-08-02: 500 [IU]
  Filled 2018-08-02: qty 5

## 2018-08-02 MED ORDER — SODIUM CHLORIDE 0.9 % IV SOLN
1200.0000 mg | Freq: Once | INTRAVENOUS | Status: AC
  Administered 2018-08-02: 1200 mg via INTRAVENOUS
  Filled 2018-08-02: qty 20

## 2018-08-02 MED ORDER — SODIUM CHLORIDE 0.9% FLUSH
10.0000 mL | INTRAVENOUS | Status: DC | PRN
  Administered 2018-08-02: 10 mL
  Filled 2018-08-02: qty 10

## 2018-08-02 MED ORDER — SODIUM CHLORIDE 0.9 % IV SOLN
Freq: Once | INTRAVENOUS | Status: AC
  Administered 2018-08-02: 14:00:00 via INTRAVENOUS
  Filled 2018-08-02: qty 250

## 2018-08-07 ENCOUNTER — Inpatient Hospital Stay: Payer: Medicare Other | Attending: Oncology | Admitting: Oncology

## 2018-08-07 ENCOUNTER — Encounter: Payer: Self-pay | Admitting: Oncology

## 2018-08-07 ENCOUNTER — Other Ambulatory Visit: Payer: Self-pay

## 2018-08-07 ENCOUNTER — Inpatient Hospital Stay: Payer: Medicare Other

## 2018-08-07 VITALS — BP 124/78 | HR 109 | Temp 97.7°F | Resp 18 | Ht 71.0 in

## 2018-08-07 DIAGNOSIS — Z5112 Encounter for antineoplastic immunotherapy: Secondary | ICD-10-CM | POA: Insufficient documentation

## 2018-08-07 DIAGNOSIS — N3289 Other specified disorders of bladder: Secondary | ICD-10-CM | POA: Diagnosis not present

## 2018-08-07 DIAGNOSIS — M8580 Other specified disorders of bone density and structure, unspecified site: Secondary | ICD-10-CM | POA: Diagnosis not present

## 2018-08-07 DIAGNOSIS — C678 Malignant neoplasm of overlapping sites of bladder: Secondary | ICD-10-CM

## 2018-08-07 DIAGNOSIS — Z923 Personal history of irradiation: Secondary | ICD-10-CM | POA: Diagnosis not present

## 2018-08-07 DIAGNOSIS — R5381 Other malaise: Secondary | ICD-10-CM | POA: Diagnosis not present

## 2018-08-07 DIAGNOSIS — Z9181 History of falling: Secondary | ICD-10-CM | POA: Diagnosis not present

## 2018-08-07 DIAGNOSIS — Z87440 Personal history of urinary (tract) infections: Secondary | ICD-10-CM | POA: Diagnosis not present

## 2018-08-07 DIAGNOSIS — K5900 Constipation, unspecified: Secondary | ICD-10-CM | POA: Diagnosis not present

## 2018-08-07 DIAGNOSIS — R5383 Other fatigue: Secondary | ICD-10-CM | POA: Diagnosis not present

## 2018-08-07 DIAGNOSIS — M1990 Unspecified osteoarthritis, unspecified site: Secondary | ICD-10-CM | POA: Diagnosis not present

## 2018-08-07 DIAGNOSIS — L97529 Non-pressure chronic ulcer of other part of left foot with unspecified severity: Secondary | ICD-10-CM | POA: Diagnosis not present

## 2018-08-07 DIAGNOSIS — I10 Essential (primary) hypertension: Secondary | ICD-10-CM

## 2018-08-07 DIAGNOSIS — K219 Gastro-esophageal reflux disease without esophagitis: Secondary | ICD-10-CM

## 2018-08-07 DIAGNOSIS — Z79899 Other long term (current) drug therapy: Secondary | ICD-10-CM | POA: Diagnosis not present

## 2018-08-07 DIAGNOSIS — D649 Anemia, unspecified: Secondary | ICD-10-CM | POA: Insufficient documentation

## 2018-08-07 DIAGNOSIS — C679 Malignant neoplasm of bladder, unspecified: Secondary | ICD-10-CM

## 2018-08-07 DIAGNOSIS — Z8546 Personal history of malignant neoplasm of prostate: Secondary | ICD-10-CM | POA: Diagnosis not present

## 2018-08-07 DIAGNOSIS — Z7189 Other specified counseling: Secondary | ICD-10-CM

## 2018-08-07 NOTE — Progress Notes (Signed)
No new changes noted today 

## 2018-08-09 NOTE — Progress Notes (Signed)
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 For his prostate cancer he has received IM RT in the past. He has been seeing Dr. Erlene Quan in the past for his recurrent UTIs as well as hematuria. CT scan in July 2018 showed bladder wall thickening with perivascular edema and inflammation in the right ureter greater than the left. Findings were thought to be due to pyelonephritis at that time. He underwent cystoscopy on 12/14/2017 which showed abnormal looking prostate with necrotic material lining the entire surface area. Diffuse copious debris within the bladder appearing to be erythematous without discrete bladder tumor but visualization was poor. He was then admitted to the hospital on 12/26/2017 with symptoms of UTI and sepsis as well as new moderate bilateral hydronephrosis.  CT abdomen and pelvis with contrast on 12/20/2017 again showed market bile bladder wall thickening with perivesicular edema. Within the lumen of the bladder there is a 93.9 cm soft tissue attenuating filling defect. This is indeterminate and could represent an area of blood clot versus urothelial lesion.  He underwent repeat cystoscopy with bilateral pyelogram and ureteral stent placement as well as TURBT and TURP. He was found to have a massive tumor involving the majority of the bladder with grossly necrotic and calcified material. There appeared to be  multifocal disease with large burden of the left anterior bladder wall extending posteriorly as well as adjacent to the bladder neck and beyond the right hemitrigone. Very little normal recognizable bladder mucosa remaining.   Biopsy from TURBT and TURP showed: High-grade urothelial carcinoma with extensive necrosisinvolving both the bladder and the prostate.CT chest did not reveal any evidence of metastatic disease. He was also not found to have any regional adenopathy on CT abdomen  Patient was seen by Dr. Erlene Quan and was not deemed to be a surgical candidate. He has been referred to [place] for definitive treatment options.  Patient lives with his wife at home and ambulates with a cane. He does need assistance with his ADLs to some extent. He has not had any falls and he denies any changes in his appetite or unintentional weight loss. Denies any pain. Reports some fatigue and occasional problems with constipation. He has had 3 hospitalizations last year for urinary tract infections and currently has a chronic Foley for the last 1 month  Dr. Erlene Quan performed interval TURBT on 01/15/18 and was able to debulk tumor as much as possible to reduce tumor burden  Patient received 5 cycles ofcarboplatin/ gemcitabine 2 weeks on and 1 week off ending 04/26/18.Patient did receive radiation for 10 days during cycle 2 of treatment. Given patients age, co-morbidities and frailty- he was not a cisplatin candidate.6th cycle not given due to fall and hip fracture  Patient had a repeat cystoscopy in September 2019 which showed no obvious tumor bladder but it did have a shaggy necrotic appearance at the bladder neck.  Prostatic fossa was grossly abnormal necrotic and irregular without papillary change.  Bladder neck was biopsied and showed residual high-grade urothelial  carcinoma.  Muscularis propria was not seen in that specimen.  Due to evidence of recurrent/residual disease patient was started on  Tecentriq on 07/26/2018   Interval history-patient tolerated Tecentriq without any significant side effects.  He continues to have issues to ambulate given that he has an ulcer on his left foot which makes it difficult for him to walk.  He does follow-up with wound clinic for the same.  He has not had any fevers or cloudy urine or change in his mental status.  He occasionally gets bladder spasms.  ECOG PS- 2 Pain scale- 0 Opioid associated constipation- no  Review of systems- Review of Systems  Constitutional: Positive for malaise/fatigue. Negative for chills, fever and weight loss.  HENT: Negative for congestion, ear discharge and nosebleeds.   Eyes: Negative for blurred vision.  Respiratory: Negative for cough, hemoptysis, sputum production, shortness of breath and wheezing.   Cardiovascular: Negative for chest pain, palpitations, orthopnea and claudication.  Gastrointestinal: Negative for abdominal pain, blood in stool, constipation, diarrhea, heartburn, melena, nausea and vomiting.  Genitourinary: Negative for dysuria, flank pain, frequency, hematuria and urgency.  Musculoskeletal: Negative for back pain, joint pain and myalgias.  Skin: Negative for rash.  Neurological: Negative for dizziness, tingling, focal weakness, seizures, weakness and headaches.  Endo/Heme/Allergies: Does not bruise/bleed easily.  Psychiatric/Behavioral: Negative for depression and suicidal ideas. The patient does not have insomnia.        Allergies  Allergen Reactions  . Penicillins Other (See Comments)    Caused nervousness as a child. Patient states he took as a teenager w/o problems Has patient had a PCN reaction causing immediate rash, facial/tongue/throat swelling, SOB or lightheadedness with hypotension: No Has patient had a PCN reaction causing severe rash involving mucus membranes or skin necrosis: No Has patient had a PCN reaction that required hospitalization: No Has patient had a PCN reaction  occurring within the last 10 years: No If all of the above answers are "NO", then may proceed with     Past Medical History:  Diagnosis Date  . Anxiety   . Arthritis   . Bladder cancer ([place])   . Dysrhythmia 07/2018   history of atrial flutter that worsens with anxiety  . Femur fracture, right ([place]) 05/01/2018  . GERD (gastroesophageal reflux disease)   . History of recent blood transfusion 05/2018  . Hypertension   . Prostate cancer ([place]) 07/2018   cancer growing in prostate but not prostate cancer, it is from the bladder  . Umbilical hernia [id number]/7654  . Urinary retention 2019   foley catheter place 11/2017  . UTI (urinary tract infection) 2019   frequent UTI's over last year  . Wound eschar of foot 07/2018   left heal getting wrapped and requiring antibiotic cream. cracks open with weight bearing.     Past Surgical History:  Procedure Laterality Date  . CARPAL TUNNEL RELEASE Right   . CHOLECYSTECTOMY  2004  . CYSTOGRAM  07/18/2018   Procedure: CYSTOGRAM;  Surgeon: [name], MD;  Location: [facility];  Service: Urology;;  . Consuela Mimes W/ RETROGRADES Bilateral 07/18/2018   Procedure: CYSTOSCOPY WITH RETROGRADE PYELOGRAM;  Surgeon: [name], MD;  Location: [facility];  Service: Urology;  Laterality: Bilateral;  . CYSTOSCOPY W/ URETERAL STENT PLACEMENT Bilateral 12/27/2017   Procedure: CYSTOSCOPY WITH RETROGRADE PYELOGRAM/URETERAL STENT PLACEMENT;  Surgeon: [name], MD;  Location: [facility];  Service: Urology;  Laterality: Bilateral;  . CYSTOSCOPY W/ URETERAL STENT PLACEMENT Bilateral 07/18/2018   Procedure: CYSTOSCOPY WITH STENT REPLACEMENT (  exchange);  Surgeon: [name], MD;  Location: [facility];  Service: Urology;  Laterality: Bilateral;  . INTRAMEDULLARY (IM) NAIL INTERTROCHANTERIC Right 05/02/2018   Procedure: INTRAMEDULLARY (IM) NAIL INTERTROCHANTRIC;  Surgeon: [name], MD;  Location: [facility];  Service: Orthopedics;  Laterality: Right;  . LEG  TENDON SURGERY Right 1958  . PORTA CATH INSERTION N/A 01/22/2018   Procedure: PORTA CATH INSERTION;  Surgeon: [name], MD;  Location: [place] [facility];  Service: Cardiovascular;  Laterality: N/A;  . TRANSURETHRAL RESECTION OF BLADDER TUMOR N/A 12/27/2017   Procedure: TRANSURETHRAL RESECTION OF BLADDER TUMOR (TURBT);  Surgeon: [name], MD;  Location: [facility];  Service: Urology;  Laterality: N/A;  . TRANSURETHRAL RESECTION OF BLADDER TUMOR N/A 01/15/2018   Procedure: TRANSURETHRAL RESECTION OF BLADDER TUMOR (TURBT);  Surgeon: [name], MD;  Location: [facility];  Service: Urology;  Laterality: N/A;  Need 2 hrs for this case please  . TRANSURETHRAL RESECTION OF BLADDER TUMOR N/A 07/18/2018   Procedure: TRANSURETHRAL RESECTION OF BLADDER TUMOR (TURBT);  Surgeon: [name], MD;  Location: [facility];  Service: Urology;  Laterality: N/A;    Social History   Socioeconomic History  . Marital status: Married    Spouse name: ruth  . Number of children: Not on file  . Years of education: Not on file  . Highest education level: Not on file  Occupational History  . Occupation: retired    Comment: Therapist, [employer]  Social Needs  . Financial resource strain: Not on file  . Food insecurity:    Worry: Not on file    Inability: Not on file  . Transportation needs:    Medical: Not on file    Non-medical: Not on file  Tobacco Use  . Smoking status: Never Smoker  . Smokeless tobacco: Never Used  Substance and Sexual Activity  . Alcohol use: No    Alcohol/week: 0.0 standard drinks  . Drug use: No  . Sexual activity: Not Currently  Lifestyle  . Physical activity:    Days per week: Not on file    Minutes per session: Not on file  . Stress: Not on file  Relationships  . Social connections:    Talks on phone: Not on file    Gets together: Not on file    Attends religious service: Not on file    Active member of club or organization: Not on file    Attends meetings of  clubs or organizations: Not on file    Relationship status: Not on file  . Intimate partner violence:    Fear of current or ex partner: Not on file    Emotionally abused: Not on file    Physically abused: Not on file    Forced sexual activity: Not on file  Other Topics Concern  . Not on file  Social History Narrative  . Not on file    Family History  Problem Relation Age of Onset  . Cancer Mother   . Chronic Renal Failure Mother   . Heart disease Father      Current Outpatient Medications:  .  cetirizine (ZYRTEC) 10 MG tablet, Take 10 mg by mouth daily., Disp: , Rfl:  .  CRANBERRY PO, Take 750 mg by mouth 2 (two) times daily. , Disp: , Rfl:  .  docusate sodium (COLACE) 100 MG capsule, Take 1 capsule (100 mg total) by mouth 2 (two) times daily., Disp: 10 capsule, Rfl: 0 .  ferrous sulfate 325 (65 FE)  MG tablet, Take 1 tablet (325 mg total) by mouth 2 (two) times daily with a meal. (Patient taking differently: Take 325 mg by mouth daily with breakfast. ), Disp: , Rfl: 3 .  Multiple Vitamin (MULTIVITAMIN WITH MINERALS) TABS tablet, Take 1 tablet by mouth daily., Disp: , Rfl:  .  MYRBETRIQ 50 MG TB24 tablet, TAKE 1 TABLET DAILY, Disp: 30 tablet, Rfl: 3 .  polyethylene glycol powder (MIRALAX) powder, Take 17 g by mouth daily as needed. Can increase to 3 times a day as needed for constipation but hold medication if has diarrhea, Disp: 255 g, Rfl: 0 .  vitamin B-12 1000 MCG tablet, Take 1 tablet (1,000 mcg total) by mouth daily. (Patient taking differently: Take 1,000 mcg by mouth every evening. ), Disp: , Rfl:  .  acetaminophen (TYLENOL) 500 MG tablet, Take 1,000 mg by mouth every 6 (six) hours as needed (for pain.)., Disp: , Rfl:  .  metoCLOPramide (REGLAN) 5 MG tablet, Take 1-2 tablets (5-10 mg total) by mouth every 8 (eight) hours as needed for nausea (if ondansetron (ZOFRAN) ineffective.). (Patient not taking: Reported on 07/06/2018), Disp: , Rfl:  .  ondansetron (ZOFRAN) 4 MG tablet,  Take 1 tablet (4 mg total) by mouth every 6 (six) hours as needed for nausea. (Patient not taking: Reported on 07/12/2018), Disp: 20 tablet, Rfl: 0 .  oxyCODONE (OXY IR/ROXICODONE) 5 MG immediate release tablet, Take 1-2 tablets (5-10 mg total) by mouth every 4 (four) hours as needed for moderate pain (pain score 4-6). (Patient not taking: Reported on 07/06/2018), Disp: 30 tablet, Rfl: 0 .  pantoprazole (PROTONIX) 40 MG tablet, Take 1 tablet (40 mg total) by mouth daily. (Patient not taking: Reported on 07/26/2018), Disp: , Rfl:   Physical exam:  Vitals:   08/07/18 1003  BP: 124/78  Pulse: (!) 109  Resp: 18  Temp: 97.7 F (36.5 C)  TempSrc: Tympanic  SpO2: 97%  Height: 5\' 11"  (1.803 m)   Physical Exam  Constitutional: He is oriented to person, place, and time.  Frail elderly gentleman sitting in a wheelchair.  Appears in no acute distress  HENT:  Head: Normocephalic and atraumatic.  Eyes: Pupils are equal, round, and reactive to light. EOM are normal.  Neck: Normal range of motion.  Cardiovascular: Normal rate, regular rhythm and normal heart sounds.  Pulmonary/Chest: Effort normal and breath sounds normal.  Abdominal: Soft. Bowel sounds are normal.  Foley catheter draining clear urine  Musculoskeletal:  Dressing in place over left foot  Neurological: He is alert and oriented to person, place, and time.  Skin: Skin is warm and dry.     CMP Latest Ref Rng & Units 07/26/2018  Glucose 70 - 99 mg/dL 130(H)  BUN 8 - 23 mg/dL 28(H)  Creatinine 0.61 - 1.24 mg/dL 1.09  Sodium 135 - 145 mmol/L 139  Potassium 3.5 - 5.1 mmol/L 3.7  Chloride 98 - 111 mmol/L 107  CO2 22 - 32 mmol/L 24  Calcium 8.9 - 10.3 mg/dL 9.6  Total Protein 6.5 - 8.1 g/dL 7.4  Total Bilirubin 0.3 - 1.2 mg/dL 0.3  Alkaline Phos 38 - 126 U/L 69  AST 15 - 41 U/L 15  ALT 0 - 44 U/L 10   CBC Latest Ref Rng & Units 07/26/2018  WBC 3.8 - 10.6 K/uL 13.8(H)  Hemoglobin 13.0 - 18.0 g/dL 12.2(L)  Hematocrit 40.0 - 52.0 %  36.6(L)  Platelets 150 - 440 K/uL 358    No images are attached to  the encounter.  Ct Chest W Contrast  Result Date: 07/13/2018 CLINICAL DATA:  Restaging bladder and prostate cancer. Status post chemotherapy in June. EXAM: CT CHEST, ABDOMEN, AND PELVIS WITH CONTRAST TECHNIQUE: Multidetector CT imaging of the chest, abdomen and pelvis was performed following the standard protocol during bolus administration of intravenous contrast. CONTRAST:  112mL ISOVUE-300 IOPAMIDOL (ISOVUE-300) INJECTION 61% COMPARISON:  CT cap 04/04/2018 FINDINGS: CT CHEST FINDINGS Cardiovascular: Heart size appears normal. Aortic atherosclerosis. Calcifications within the LAD, left main, left circumflex and RCA coronary arteries noted. No pericardial effusion. Mediastinum/Nodes: Normal appearance of the thyroid gland. The trachea appears patent and is midline. Normal appearance of the esophagus. No enlarged mediastinal or hilar lymph nodes. Lungs/Pleura: No pleural effusion. No airspace consolidation, atelectasis or pneumothorax. Posterior right upper lobe scarring appears unchanged from previous exam, image 35/4. Subpleural calcification overlying the anterior left upper lobe unchanged, image 75/4. No suspicious pulmonary nodules. Musculoskeletal: No chest wall mass or suspicious bone lesions identified. CT ABDOMEN PELVIS FINDINGS Hepatobiliary: No suspicious liver lesions. Status post cholecystectomy. No biliary dilatation. Pancreas: Scattered pancreatic calcifications compatible with chronic pancreatitis. No pancreatic ductal dilatation or surrounding inflammatory changes. Spleen: Normal in size without focal abnormality. Adrenals/Urinary Tract: Normal appearance of the adrenal glands. Unchanged appearance of kidney cysts. Bilateral ureteral stents are in place. Persistent right-sided hydronephrosis. Dilated right renal pelvis measures 3.6 cm, image 79/2. Increased from 3 cm previously. Left-sided pelvocaliectasis is noted. Mildly  dilated left renal pelvis measures 1.5 cm, image 76/2. Previously 1.7 cm. The bladder is collapsed around a Foley catheter balloon. Unchanged appearance of diffuse bladder wall thickening, image 116/2. Stomach/Bowel: Stomach is normal. The small bowel loops have a normal course and caliber without obstruction. The appendix is visualized and appears normal. Distal colonic diverticula noted without acute inflammation. No pathologic dilatation of the colon. Vascular/Lymphatic: Aortic atherosclerosis. No aneurysm. No enlarged abdominal or pelvic adenopathy. No inguinal adenopathy. Reproductive: Seed implants identified within the prostate gland. Other: No abdominal wall hernia or abnormality. No abdominopelvic ascites. Musculoskeletal: Previous ORIF of the right proximal femur. Advanced bilateral hip osteoarthritis noted. Spondylosis identified within the thoracic spine. The bones appear diffusely osteopenic. No aggressive lytic or sclerotic bone lesions. IMPRESSION: 1. Persistent bilateral hydronephrosis, right greater than left. Double-J ureteral stents in place. 2. No findings identified to suggest metastatic disease. 3. Unchanged appearance of diffuse bladder wall thickening. 4. Aortic Atherosclerosis (ICD10-I70.0). Coronary artery atherosclerotic calcifications noted. 5. Asbestos related pleural disease. Electronically Signed   By: Kerby Moors M.D.   On: 07/13/2018 10:38   Ct Abdomen Pelvis W Contrast  Result Date: 07/13/2018 CLINICAL DATA:  Restaging bladder and prostate cancer. Status post chemotherapy in June. EXAM: CT CHEST, ABDOMEN, AND PELVIS WITH CONTRAST TECHNIQUE: Multidetector CT imaging of the chest, abdomen and pelvis was performed following the standard protocol during bolus administration of intravenous contrast. CONTRAST:  111mL ISOVUE-300 IOPAMIDOL (ISOVUE-300) INJECTION 61% COMPARISON:  CT cap 04/04/2018 FINDINGS: CT CHEST FINDINGS Cardiovascular: Heart size appears normal. Aortic  atherosclerosis. Calcifications within the LAD, left main, left circumflex and RCA coronary arteries noted. No pericardial effusion. Mediastinum/Nodes: Normal appearance of the thyroid gland. The trachea appears patent and is midline. Normal appearance of the esophagus. No enlarged mediastinal or hilar lymph nodes. Lungs/Pleura: No pleural effusion. No airspace consolidation, atelectasis or pneumothorax. Posterior right upper lobe scarring appears unchanged from previous exam, image 35/4. Subpleural calcification overlying the anterior left upper lobe unchanged, image 75/4. No suspicious pulmonary nodules. Musculoskeletal: No chest wall mass or suspicious bone  lesions identified. CT ABDOMEN PELVIS FINDINGS Hepatobiliary: No suspicious liver lesions. Status post cholecystectomy. No biliary dilatation. Pancreas: Scattered pancreatic calcifications compatible with chronic pancreatitis. No pancreatic ductal dilatation or surrounding inflammatory changes. Spleen: Normal in size without focal abnormality. Adrenals/Urinary Tract: Normal appearance of the adrenal glands. Unchanged appearance of kidney cysts. Bilateral ureteral stents are in place. Persistent right-sided hydronephrosis. Dilated right renal pelvis measures 3.6 cm, image 79/2. Increased from 3 cm previously. Left-sided pelvocaliectasis is noted. Mildly dilated left renal pelvis measures 1.5 cm, image 76/2. Previously 1.7 cm. The bladder is collapsed around a Foley catheter balloon. Unchanged appearance of diffuse bladder wall thickening, image 116/2. Stomach/Bowel: Stomach is normal. The small bowel loops have a normal course and caliber without obstruction. The appendix is visualized and appears normal. Distal colonic diverticula noted without acute inflammation. No pathologic dilatation of the colon. Vascular/Lymphatic: Aortic atherosclerosis. No aneurysm. No enlarged abdominal or pelvic adenopathy. No inguinal adenopathy. Reproductive: Seed implants  identified within the prostate gland. Other: No abdominal wall hernia or abnormality. No abdominopelvic ascites. Musculoskeletal: Previous ORIF of the right proximal femur. Advanced bilateral hip osteoarthritis noted. Spondylosis identified within the thoracic spine. The bones appear diffusely osteopenic. No aggressive lytic or sclerotic bone lesions. IMPRESSION: 1. Persistent bilateral hydronephrosis, right greater than left. Double-J ureteral stents in place. 2. No findings identified to suggest metastatic disease. 3. Unchanged appearance of diffuse bladder wall thickening. 4. Aortic Atherosclerosis (ICD10-I70.0). Coronary artery atherosclerotic calcifications noted. 5. Asbestos related pleural disease. Electronically Signed   By: Kerby Moors M.D.   On: 07/13/2018 10:38     Assessment and plan- Patient is a 83 y.o. 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  His baseline TSH is normal.  Plan is to continue Tecentriq every 3 weeks until progression or toxicity.  I will see him back in 2 weeks time with CBC and CMP prior to cycle 2 of Tecentriq.   Visit Diagnosis 1. Malignant neoplasm of overlapping sites of bladder [facility])      Dr. Randa Evens, MD, MPH [facility] [phone] 08/09/2018 9:05 AM

## 2018-08-22 ENCOUNTER — Encounter: Payer: Self-pay | Admitting: Radiation Oncology

## 2018-08-22 ENCOUNTER — Other Ambulatory Visit: Payer: Self-pay

## 2018-08-22 ENCOUNTER — Ambulatory Visit
Admission: RE | Admit: 2018-08-22 | Discharge: 2018-08-22 | Disposition: A | Discharge status: Home / Self Care | Payer: Medicare Other | Source: Ambulatory Visit | Attending: Radiation Oncology | Admitting: Radiation Oncology

## 2018-08-22 DIAGNOSIS — C679 Malignant neoplasm of bladder, unspecified: Secondary | ICD-10-CM | POA: Diagnosis not present

## 2018-08-22 DIAGNOSIS — Z8546 Personal history of malignant neoplasm of prostate: Secondary | ICD-10-CM | POA: Diagnosis not present

## 2018-08-22 DIAGNOSIS — Z9221 Personal history of antineoplastic chemotherapy: Secondary | ICD-10-CM | POA: Insufficient documentation

## 2018-08-22 DIAGNOSIS — N1330 Unspecified hydronephrosis: Secondary | ICD-10-CM | POA: Diagnosis not present

## 2018-08-22 DIAGNOSIS — Z923 Personal history of irradiation: Secondary | ICD-10-CM | POA: Diagnosis not present

## 2018-08-22 NOTE — Progress Notes (Signed)
Radiation Oncology Follow up Note  Name: Vincent Poole   Date:   08/22/2018 MRN:  [id number] DOB: Apr 03, 1935    This 83 y.o. male presents to the clinic today for 5 month follow-up status post palliative radiation therapy to his bladder for urothelial carcinoma inpatient previous he treated 7 years prior for adenocarcinoma the prostate.  REFERRING PROVIDER: [name], MD  HPI: patient is an 83 year old male now out 5 months having completed palliative radiation therapy to his bladder for urothelial carcinoma inpatient previous he treated 7 years prior with external beam radiation therapy for adenocarcinoma of the prostate seen today in routine follow-up he is doing fairly well. He continues to have bilateral hydronephrosis and has stents in place..he has not had any recent hematuria.his cystoscopies in early on showed extensive involvement of the bladder with grossly necrotic and calcified tumor. Biopsy was positive for high-grade urothelial carcinoma with extensive necrosis.he is undergone also chemotherapy with carboplatinum and gemcitabine. He had a cystoscopy back in September showed no evidence tumor but shaggy necrotic appearance of the bladder neck. He is currently on Tecentriq and tolerating that well.CT scan in September also showed persistent bilateral hydronephrosis right greater than left with double-J ureteral stents in place. No findings for metastatic disease. He does have persistent bladder wall thickening.  COMPLICATIONS OF TREATMENT: none  FOLLOW UP COMPLIANCE: keeps appointments   PHYSICAL EXAM:  BP (P) 118/68 (BP Location: Left Arm, Patient Position: Sitting)   Pulse (!) (P) 106   Temp (!) (P) 97.1 F (36.2 C) (Tympanic)   Resp (P) 12  Wheelchair-bound male in NAD.Well-developed well-nourished patient in NAD. HEENT reveals PERLA, EOMI, discs not visualized.  Oral cavity is clear. No oral mucosal lesions are identified. Neck is clear without evidence of cervical  or supraclavicular adenopathy. Lungs are clear to A&P. Cardiac examination is essentially unremarkable with regular rate and rhythm without murmur rub or thrill. Abdomen is benign with no organomegaly or masses noted. Motor sensory and DTR levels are equal and symmetric in the upper and lower extremities. Cranial nerves II through XII are grossly intact. Proprioception is intact. No peripheral adenopathy or edema is identified. No motor or sensory levels are noted. Crude visual fields are within normal range.  RADIOLOGY RESULTS: CT scans reviewed compatible above-stated findings  PLAN: the present time patient is doing well currently on immunotherapy withTecentriq . I'm please was overall progress. I'm please sees having no more hematuria at this time. He continues close follow-up care with Dr. Erlene Quan as well as Dr. [name]. I have asked to see him back in 6 months for follow-up. Be happy to reevaluate the patient any time for further palliative treatment.  I would like to take this opportunity to thank you for allowing me to participate in the care of your patient.Noreene Filbert, MD

## 2018-08-23 ENCOUNTER — Inpatient Hospital Stay (HOSPITAL_BASED_OUTPATIENT_CLINIC_OR_DEPARTMENT_OTHER): Payer: Medicare Other | Admitting: Oncology

## 2018-08-23 ENCOUNTER — Encounter: Payer: Self-pay | Admitting: Oncology

## 2018-08-23 ENCOUNTER — Inpatient Hospital Stay: Payer: Medicare Other

## 2018-08-23 VITALS — BP 130/75 | HR 94 | Temp 97.8°F | Resp 18 | Ht 71.0 in

## 2018-08-23 DIAGNOSIS — I10 Essential (primary) hypertension: Secondary | ICD-10-CM

## 2018-08-23 DIAGNOSIS — Z5112 Encounter for antineoplastic immunotherapy: Secondary | ICD-10-CM

## 2018-08-23 DIAGNOSIS — C678 Malignant neoplasm of overlapping sites of bladder: Secondary | ICD-10-CM

## 2018-08-23 DIAGNOSIS — R54 Age-related physical debility: Secondary | ICD-10-CM

## 2018-08-23 DIAGNOSIS — C679 Malignant neoplasm of bladder, unspecified: Secondary | ICD-10-CM

## 2018-08-23 DIAGNOSIS — Z8546 Personal history of malignant neoplasm of prostate: Secondary | ICD-10-CM

## 2018-08-23 DIAGNOSIS — R5383 Other fatigue: Secondary | ICD-10-CM

## 2018-08-23 DIAGNOSIS — K219 Gastro-esophageal reflux disease without esophagitis: Secondary | ICD-10-CM

## 2018-08-23 DIAGNOSIS — Z923 Personal history of irradiation: Secondary | ICD-10-CM

## 2018-08-23 DIAGNOSIS — N3289 Other specified disorders of bladder: Secondary | ICD-10-CM

## 2018-08-23 DIAGNOSIS — Z79899 Other long term (current) drug therapy: Secondary | ICD-10-CM

## 2018-08-23 DIAGNOSIS — R5381 Other malaise: Secondary | ICD-10-CM

## 2018-08-23 DIAGNOSIS — Z9181 History of falling: Secondary | ICD-10-CM

## 2018-08-23 DIAGNOSIS — M1990 Unspecified osteoarthritis, unspecified site: Secondary | ICD-10-CM

## 2018-08-23 DIAGNOSIS — D649 Anemia, unspecified: Secondary | ICD-10-CM

## 2018-08-23 DIAGNOSIS — M8580 Other specified disorders of bone density and structure, unspecified site: Secondary | ICD-10-CM

## 2018-08-23 DIAGNOSIS — C61 Malignant neoplasm of prostate: Secondary | ICD-10-CM

## 2018-08-23 DIAGNOSIS — Z87440 Personal history of urinary (tract) infections: Secondary | ICD-10-CM

## 2018-08-23 LAB — COMPREHENSIVE METABOLIC PANEL
ALBUMIN: 3.2 g/dL — AB (ref 3.5–5.0)
ALT: 8 U/L (ref 0–44)
AST: 14 U/L — AB (ref 15–41)
Alkaline Phosphatase: 67 U/L (ref 38–126)
Anion gap: 10 (ref 5–15)
BILIRUBIN TOTAL: 0.4 mg/dL (ref 0.3–1.2)
BUN: 17 mg/dL (ref 8–23)
CHLORIDE: 104 mmol/L (ref 98–111)
CO2: 25 mmol/L (ref 22–32)
CREATININE: 1.16 mg/dL (ref 0.61–1.24)
Calcium: 9.6 mg/dL (ref 8.9–10.3)
GFR calc Af Amer: >60 mL/min (ref >60)
GFR, EST NON AFRICAN AMERICAN: 57 mL/min — AB (ref >60)
GLUCOSE: 121 mg/dL — AB (ref 70–99)
Potassium: 3 mmol/L — ABNORMAL LOW (ref 3.5–5.1)
SODIUM: 139 mmol/L (ref 135–145)
Total Protein: 7 g/dL (ref 6.5–8.1)

## 2018-08-23 LAB — CBC WITH DIFFERENTIAL/PLATELET
Abs Immature Granulocytes: 0.05 10*3/uL (ref 0.00–0.07)
Basophils Absolute: 0.1 10*3/uL (ref 0.0–0.1)
Basophils Relative: 1 %
Eosinophils Absolute: 0.1 10*3/uL (ref 0.0–0.5)
Eosinophils Relative: 1 %
HCT: 33.9 % — ABNORMAL LOW (ref 39.0–52.0)
Hemoglobin: 10.7 g/dL — ABNORMAL LOW (ref 13.0–17.0)
Immature Granulocytes: 0 %
Lymphocytes Relative: 16 %
Lymphs Abs: 1.8 10*3/uL (ref 0.7–4.0)
MCH: 28.7 pg (ref 26.0–34.0)
MCHC: 31.6 g/dL (ref 30.0–36.0)
MCV: 90.9 fL (ref 80.0–100.0)
Monocytes Absolute: 0.9 10*3/uL (ref 0.1–1.0)
Monocytes Relative: 8 %
Neutro Abs: 8.2 10*3/uL — ABNORMAL HIGH (ref 1.7–7.7)
Neutrophils Relative %: 74 %
Platelets: 252 10*3/uL (ref 150–400)
RBC: 3.73 MIL/uL — ABNORMAL LOW (ref 4.22–5.81)
RDW: 15.6 % — ABNORMAL HIGH (ref 11.5–15.5)
WBC: 11.2 10*3/uL — ABNORMAL HIGH (ref 4.0–10.5)
nRBC: 0 % (ref 0.0–0.2)

## 2018-08-23 LAB — PSA: Prostatic Specific Antigen: 0.03 ng/mL (ref 0.00–4.00)

## 2018-08-23 MED ORDER — HEPARIN SOD (PORK) LOCK FLUSH 100 UNIT/ML IV SOLN
500.0000 [IU] | Freq: Once | INTRAVENOUS | Status: AC | PRN
  Administered 2018-08-23: 500 [IU]
  Filled 2018-08-23: qty 5

## 2018-08-23 MED ORDER — SODIUM CHLORIDE 0.9 % IV SOLN
1200.0000 mg | Freq: Once | INTRAVENOUS | Status: AC
  Administered 2018-08-23: 1200 mg via INTRAVENOUS
  Filled 2018-08-23: qty 20

## 2018-08-23 MED ORDER — SODIUM CHLORIDE 0.9 % IV SOLN
Freq: Once | INTRAVENOUS | Status: AC
  Administered 2018-08-23: 12:00:00 via INTRAVENOUS
  Filled 2018-08-23: qty 250

## 2018-08-24 ENCOUNTER — Other Ambulatory Visit: Payer: Self-pay | Admitting: Urology

## 2018-08-25 NOTE — Progress Notes (Signed)
Hematology/Oncology Consult note 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 For his prostate cancer he has received IM RT in the past. He has been seeing Dr. Erlene Quan in the past for his recurrent UTIs as well as hematuria. CT scan in July 2018 showed bladder wall thickening with perivascular edema and inflammation in the right ureter greater than the left. Findings were thought to be due to pyelonephritis at that time. He underwent cystoscopy on 12/14/2017 which showed abnormal looking prostate with necrotic material lining the entire surface area. Diffuse copious debris within the bladder appearing to be erythematous without discrete bladder tumor but visualization was poor. He was then admitted to the hospital on 12/26/2017 with symptoms of UTI and sepsis as well as new moderate bilateral hydronephrosis.  CT abdomen and pelvis with contrast on 12/20/2017 again showed market bile bladder wall thickening with perivesicular edema. Within the lumen of the bladder there is a 93.9 cm soft tissue attenuating filling defect. This is indeterminate and could represent an area of blood clot versus urothelial lesion.  He underwent repeat cystoscopy with bilateral pyelogram and ureteral stent placement as well as TURBT and TURP. He was found to have a massive tumor involving the majority of the bladder with grossly necrotic and calcified material.  There appeared to be multifocal disease with large burden of the left anterior bladder wall extending posteriorly as well as adjacent to the bladder neck and beyond the right hemitrigone. Very little normal recognizable bladder mucosa remaining.   Biopsy from TURBT and TURP showed: High-grade urothelial carcinoma with extensive necrosisinvolving both the bladder and the prostate.CT chest did not reveal any evidence of metastatic disease. He was also not found to have any regional adenopathy on CT abdomen  Patient was seen by Dr. Erlene Quan and was not deemed to be a surgical candidate. He has been referred to [place] for definitive treatment options.  Patient lives with his wife at home and ambulates with a cane. He does need assistance with his ADLs to some extent. He has not had any falls and he denies any changes in his appetite or unintentional weight loss. Denies any pain. Reports some fatigue and occasional problems with constipation. He has had 3 hospitalizations last year for urinary tract infections and currently has a chronic Foley for the last 1 month  Dr. Erlene Quan performed interval TURBT on 01/15/18 and was able to debulk tumor as much as possible to reduce tumor burden  Patient received 5 cycles ofcarboplatin/ gemcitabine 2 weeks on and 1 week off ending 04/26/18.Patient did receive radiation for 10 days during cycle 2 of treatment. Given patients age, co-morbidities and frailty- he was not a cisplatin candidate.6th cycle not given due to fall and hip fracture  Patient had a repeat cystoscopy in September 2019 which showed no obvious tumor bladder but it did have a shaggy necrotic appearance at the bladder neck. Prostatic fossa was grossly abnormal necrotic and irregular without papillary change. Bladder neck was biopsied and showed residual  high-grade urothelial carcinoma. Muscularis propria was not seen in that specimen.  Due to evidence of recurrent/residual disease  patient was started on Tecentriq on 07/26/2018   Interval history- left foot wounds is slowly healing and he is hopeful that his boot will be out very soon. No falls. Denies any fever or abdominal pain  ECOG PS- 2 Pain scale- 0   Review of systems- Review of Systems  Constitutional: Positive for malaise/fatigue. Negative for chills, fever and weight loss.  HENT: Negative for congestion, ear discharge and nosebleeds.   Eyes: Negative for blurred vision.  Respiratory: Negative for cough, hemoptysis, sputum production, shortness of breath and wheezing.   Cardiovascular: Negative for chest pain, palpitations, orthopnea and claudication.  Gastrointestinal: Negative for abdominal pain, blood in stool, constipation, diarrhea, heartburn, melena, nausea and vomiting.  Genitourinary: Negative for dysuria, flank pain, frequency, hematuria and urgency.  Musculoskeletal: Negative for back pain, joint pain and myalgias.  Skin: Negative for rash.  Neurological: Negative for dizziness, tingling, focal weakness, seizures, weakness and headaches.  Endo/Heme/Allergies: Does not bruise/bleed easily.  Psychiatric/Behavioral: Negative for depression and suicidal ideas. The patient does not have insomnia.       Allergies  Allergen Reactions  . Penicillins Other (See Comments)    Caused nervousness as a child. Patient states he took as a teenager w/o problems Has patient had a PCN reaction causing immediate rash, facial/tongue/throat swelling, SOB or lightheadedness with hypotension: No Has patient had a PCN reaction causing severe rash involving mucus membranes or skin necrosis: No Has patient had a PCN reaction that required hospitalization: No Has patient had a PCN reaction occurring within the last 10 years: No If all of the above answers are "NO", then may proceed with     Past Medical History:  Diagnosis Date  . Anxiety   . Arthritis   . Bladder cancer ([place])   . Dysrhythmia 07/2018   history  of atrial flutter that worsens with anxiety  . Femur fracture, right ([place]) 05/01/2018  . GERD (gastroesophageal reflux disease)   . History of recent blood transfusion 05/2018  . Hypertension   . Prostate cancer ([place]) 07/2018   cancer growing in prostate but not prostate cancer, it is from the bladder  . Umbilical hernia 27/7824  . Urinary retention 2019   foley catheter place 11/2017  . UTI (urinary tract infection) 2019   frequent UTI's over last year  . Wound eschar of foot 07/2018   left heal getting wrapped and requiring antibiotic cream. cracks open with weight bearing.     Past Surgical History:  Procedure Laterality Date  . CARPAL TUNNEL RELEASE Right   . CHOLECYSTECTOMY  2004  . CYSTOGRAM  07/18/2018   Procedure: CYSTOGRAM;  Surgeon: [name], MD;  Location: [facility];  Service: Urology;;  . Consuela Mimes W/ RETROGRADES Bilateral 07/18/2018   Procedure: CYSTOSCOPY WITH RETROGRADE PYELOGRAM;  Surgeon: [name], MD;  Location: [facility];  Service: Urology;  Laterality: Bilateral;  . CYSTOSCOPY W/ URETERAL STENT PLACEMENT Bilateral 12/27/2017   Procedure: CYSTOSCOPY WITH RETROGRADE PYELOGRAM/URETERAL STENT PLACEMENT;  Surgeon: [name], MD;  Location: [facility];  Service: Urology;  Laterality: Bilateral;  . CYSTOSCOPY W/ URETERAL STENT PLACEMENT Bilateral 07/18/2018   Procedure: CYSTOSCOPY WITH STENT REPLACEMENT (exchange);  Surgeon: [name], MD;  Location: [facility];  Service: Urology;  Laterality: Bilateral;  . INTRAMEDULLARY (IM) NAIL INTERTROCHANTERIC Right 05/02/2018   Procedure: INTRAMEDULLARY (IM) NAIL INTERTROCHANTRIC;  Surgeon: [name], MD;  Location: [facility];  Service: Orthopedics;  Laterality: Right;  . LEG TENDON SURGERY Right 1958  . PORTA CATH INSERTION N/A 01/22/2018   Procedure: PORTA CATH INSERTION;  Surgeon: [name], MD;  Location: [place] [facility];  Service: Cardiovascular;  Laterality: N/A;  . TRANSURETHRAL RESECTION OF  BLADDER TUMOR N/A 12/27/2017   Procedure: TRANSURETHRAL RESECTION OF BLADDER TUMOR (TURBT);  Surgeon: [name], MD;  Location: [facility];  Service: Urology;  Laterality: N/A;  . TRANSURETHRAL RESECTION OF BLADDER TUMOR N/A 01/15/2018   Procedure: TRANSURETHRAL RESECTION OF BLADDER TUMOR (TURBT);  Surgeon: [name], MD;  Location: [facility];  Service: Urology;  Laterality: N/A;  Need 2 hrs for this case please  . TRANSURETHRAL RESECTION OF BLADDER TUMOR N/A 07/18/2018   Procedure: TRANSURETHRAL RESECTION OF BLADDER TUMOR (TURBT);  Surgeon: [name], MD;  Location: [facility];  Service: Urology;  Laterality: N/A;    Social History   Socioeconomic History  . Marital status: Married    Spouse name: ruth  . Number of children: Not on file  . Years of education: Not on file  . Highest education level: Not on file  Occupational History  . Occupation: retired    Comment: Therapist, [employer]  Social Needs  . Financial resource strain: Not on file  . Food insecurity:    Worry: Not on file    Inability: Not on file  . Transportation needs:    Medical: Not on file    Non-medical: Not on file  Tobacco Use  . Smoking status: Never Smoker  . Smokeless tobacco: Never Used  Substance and Sexual Activity  . Alcohol use: No    Alcohol/week: 0.0 standard drinks  . Drug use: No  . Sexual activity: Not Currently  Lifestyle  . Physical activity:    Days per week: Not on file    Minutes per session: Not on file  . Stress: Not on file  Relationships  . Social connections:    Talks on phone: Not on file    Gets together: Not on file    Attends religious service: Not on file    Active member of club or organization: Not on file    Attends meetings of clubs or organizations: Not on file    Relationship status: Not on file  . Intimate partner violence:    Fear of current or ex partner: Not on file    Emotionally abused: Not on file    Physically abused: Not on file    Forced sexual  activity: Not on file  Other Topics Concern  . Not on file  Social History Narrative  . Not on file    Family History  Problem Relation Age of Onset  . Cancer Mother   . Chronic Renal Failure Mother   . Heart disease Father      Current Outpatient Medications:  .  acetaminophen (TYLENOL) 500 MG tablet, Take 1,000 mg by mouth every 6 (six) hours as needed (for pain.)., Disp: , Rfl:  .  cetirizine (ZYRTEC) 10 MG tablet, Take 10 mg by mouth daily., Disp: , Rfl:  .  CRANBERRY PO, Take 750 mg by mouth 2 (two) times daily. , Disp: , Rfl:  .  docusate sodium (COLACE) 100 MG capsule, Take 1 capsule (100 mg total) by mouth 2 (two) times daily., Disp: 10 capsule, Rfl: 0 .  ferrous sulfate 325 (65 FE) MG tablet, Take 1 tablet (325 mg total) by mouth 2 (two) times daily with a meal. (Patient  taking differently: Take 325 mg by mouth daily with breakfast. ), Disp: , Rfl: 3 .  metoCLOPramide (REGLAN) 5 MG tablet, Take 1-2 tablets (5-10 mg total) by mouth every 8 (eight) hours as needed for nausea (if ondansetron (ZOFRAN) ineffective.). (Patient not taking: Reported on 07/06/2018), Disp: , Rfl:  .  Multiple Vitamin (MULTIVITAMIN WITH MINERALS) TABS tablet, Take 1 tablet by mouth daily., Disp: , Rfl:  .  MYRBETRIQ 50 MG TB24 tablet, TAKE 1 TABLET DAILY, Disp: 30 tablet, Rfl: 3 .  neomycin-bacitracin-polymyxin (NEOSPORIN) ointment, Apply topically., Disp: , Rfl:  .  NYAMYC powder, APPLY TOPICALLY TWICE DAILY, Disp: 30 g, Rfl: 0 .  ondansetron (ZOFRAN) 4 MG tablet, Take 1 tablet (4 mg total) by mouth every 6 (six) hours as needed for nausea. (Patient not taking: Reported on 07/12/2018), Disp: 20 tablet, Rfl: 0 .  oxyCODONE (OXY IR/ROXICODONE) 5 MG immediate release tablet, Take 1-2 tablets (5-10 mg total) by mouth every 4 (four) hours as needed for moderate pain (pain score 4-6). (Patient not taking: Reported on 07/06/2018), Disp: 30 tablet, Rfl: 0 .  pantoprazole (PROTONIX) 40 MG tablet, Take 1 tablet (40 mg  total) by mouth daily. (Patient not taking: Reported on 07/26/2018), Disp: , Rfl:  .  polyethylene glycol powder (MIRALAX) powder, Take 17 g by mouth daily as needed. Can increase to 3 times a day as needed for constipation but hold medication if has diarrhea, Disp: 255 g, Rfl: 0 .  vitamin B-12 1000 MCG tablet, Take 1 tablet (1,000 mcg total) by mouth daily. (Patient taking differently: Take 1,000 mcg by mouth every evening. ), Disp: , Rfl:   Physical exam:  Vitals:   08/23/18 1023  BP: 130/75  Pulse: 94  Resp: 18  Temp: 97.8 F (36.6 C)  TempSrc: Tympanic  Height: 5\' 11"  (1.803 m)   Physical Exam  Constitutional: He is oriented to person, place, and time.  Frail elderly gentleman sitting in a wheelchair  HENT:  Head: Normocephalic and atraumatic.  Eyes: Pupils are equal, round, and reactive to light. EOM are normal.  Neck: Normal range of motion.  Cardiovascular: Normal rate, regular rhythm and normal heart sounds.  Pulmonary/Chest: Effort normal and breath sounds normal.  Abdominal: Soft. Bowel sounds are normal.  Foley draining cloudy urine  Neurological: He is alert and oriented to person, place, and time.  Skin: Skin is warm and dry.     CMP Latest Ref Rng & Units 08/23/2018  Glucose 70 - 99 mg/dL 121(H)  BUN 8 - 23 mg/dL 17  Creatinine 0.61 - 1.24 mg/dL 1.16  Sodium 135 - 145 mmol/L 139  Potassium 3.5 - 5.1 mmol/L 3.0(L)  Chloride 98 - 111 mmol/L 104  CO2 22 - 32 mmol/L 25  Calcium 8.9 - 10.3 mg/dL 9.6  Total Protein 6.5 - 8.1 g/dL 7.0  Total Bilirubin 0.3 - 1.2 mg/dL 0.4  Alkaline Phos 38 - 126 U/L 67  AST 15 - 41 U/L 14(L)  ALT 0 - 44 U/L 8   CBC Latest Ref Rng & Units 08/23/2018  WBC 4.0 - 10.5 K/uL 11.2(H)  Hemoglobin 13.0 - 17.0 g/dL 10.7(L)  Hematocrit 39.0 - 52.0 % 33.9(L)  Platelets 150 - 400 K/uL 252    Assessment and plan- Patient is a 83 y.o. 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 He is here for on treatment assessment prior to cycle 2 of  maintenance tecentriq.   Counts ok to proceed with cycle 2 of maintenance tecentriq today. I will see him in 3 weeks with cbc/ cmpo for cycle 4 of tecentriq.   His anemia is a little worse at 10 as compared to his baseline. Will check ferritin iron studies b12 and folate with next set of labs  His urine is cloudy but symptoms of UTi. Will therefore not check UA today   Visit Diagnosis 1. Malignant neoplasm of urinary bladder, unspecified site ([place])   2. Encounter for antineoplastic immunotherapy      Dr. Randa Evens, MD, MPH [facility] [phone] 08/25/2018 8:31 AM

## 2018-09-13 ENCOUNTER — Inpatient Hospital Stay: Payer: Medicare Other | Attending: Oncology

## 2018-09-13 ENCOUNTER — Ambulatory Visit: Payer: Medicare Other

## 2018-09-13 ENCOUNTER — Ambulatory Visit: Payer: Medicare Other | Admitting: Oncology

## 2018-09-13 ENCOUNTER — Other Ambulatory Visit: Payer: Self-pay | Admitting: *Deleted

## 2018-09-13 ENCOUNTER — Inpatient Hospital Stay (HOSPITAL_BASED_OUTPATIENT_CLINIC_OR_DEPARTMENT_OTHER): Payer: Medicare Other | Admitting: Oncology

## 2018-09-13 ENCOUNTER — Inpatient Hospital Stay: Payer: Medicare Other

## 2018-09-13 ENCOUNTER — Other Ambulatory Visit: Payer: Medicare Other

## 2018-09-13 ENCOUNTER — Encounter: Payer: Self-pay | Admitting: Oncology

## 2018-09-13 VITALS — BP 124/71 | HR 97 | Temp 97.8°F | Resp 18 | Ht 71.0 in

## 2018-09-13 DIAGNOSIS — M1990 Unspecified osteoarthritis, unspecified site: Secondary | ICD-10-CM | POA: Insufficient documentation

## 2018-09-13 DIAGNOSIS — Z5112 Encounter for antineoplastic immunotherapy: Secondary | ICD-10-CM

## 2018-09-13 DIAGNOSIS — Z923 Personal history of irradiation: Secondary | ICD-10-CM

## 2018-09-13 DIAGNOSIS — Z79899 Other long term (current) drug therapy: Secondary | ICD-10-CM | POA: Insufficient documentation

## 2018-09-13 DIAGNOSIS — C678 Malignant neoplasm of overlapping sites of bladder: Secondary | ICD-10-CM

## 2018-09-13 DIAGNOSIS — R338 Other retention of urine: Secondary | ICD-10-CM

## 2018-09-13 DIAGNOSIS — C7982 Secondary malignant neoplasm of genital organs: Secondary | ICD-10-CM

## 2018-09-13 DIAGNOSIS — C61 Malignant neoplasm of prostate: Secondary | ICD-10-CM

## 2018-09-13 DIAGNOSIS — D509 Iron deficiency anemia, unspecified: Secondary | ICD-10-CM

## 2018-09-13 DIAGNOSIS — K5900 Constipation, unspecified: Secondary | ICD-10-CM | POA: Insufficient documentation

## 2018-09-13 DIAGNOSIS — R54 Age-related physical debility: Secondary | ICD-10-CM | POA: Insufficient documentation

## 2018-09-13 DIAGNOSIS — R5383 Other fatigue: Secondary | ICD-10-CM | POA: Insufficient documentation

## 2018-09-13 DIAGNOSIS — C689 Malignant neoplasm of urinary organ, unspecified: Secondary | ICD-10-CM

## 2018-09-13 DIAGNOSIS — I10 Essential (primary) hypertension: Secondary | ICD-10-CM | POA: Diagnosis not present

## 2018-09-13 DIAGNOSIS — Z87440 Personal history of urinary (tract) infections: Secondary | ICD-10-CM | POA: Diagnosis not present

## 2018-09-13 DIAGNOSIS — C679 Malignant neoplasm of bladder, unspecified: Secondary | ICD-10-CM

## 2018-09-13 LAB — VITAMIN B12: VITAMIN B 12: 200 pg/mL (ref 180–914)

## 2018-09-13 LAB — PSA: Prostatic Specific Antigen: 0.03 ng/mL (ref 0.00–4.00)

## 2018-09-13 LAB — IRON AND TIBC
IRON: 21 ug/dL — AB (ref 45–182)
Saturation Ratios: 10 % — ABNORMAL LOW (ref 17.9–39.5)
TIBC: 217 ug/dL — AB (ref 250–450)
UIBC: 196 ug/dL

## 2018-09-13 LAB — CBC WITH DIFFERENTIAL/PLATELET
ABS IMMATURE GRANULOCYTES: 0.06 10*3/uL (ref 0.00–0.07)
Basophils Absolute: 0 10*3/uL (ref 0.0–0.1)
Basophils Relative: 0 %
EOS ABS: 0.1 10*3/uL (ref 0.0–0.5)
Eosinophils Relative: 1 %
HEMATOCRIT: 33.9 % — AB (ref 39.0–52.0)
HEMOGLOBIN: 10.7 g/dL — AB (ref 13.0–17.0)
IMMATURE GRANULOCYTES: 1 %
LYMPHS PCT: 16 %
Lymphs Abs: 1.9 10*3/uL (ref 0.7–4.0)
MCH: 28.5 pg (ref 26.0–34.0)
MCHC: 31.6 g/dL (ref 30.0–36.0)
MCV: 90.2 fL (ref 80.0–100.0)
Monocytes Absolute: 0.9 10*3/uL (ref 0.1–1.0)
Monocytes Relative: 8 %
NEUTROS ABS: 8.7 10*3/uL — AB (ref 1.7–7.7)
NEUTROS PCT: 74 %
Platelets: 259 10*3/uL (ref 150–400)
RBC: 3.76 MIL/uL — ABNORMAL LOW (ref 4.22–5.81)
RDW: 15.4 % (ref 11.5–15.5)
WBC: 11.7 10*3/uL — ABNORMAL HIGH (ref 4.0–10.5)
nRBC: 0 % (ref 0.0–0.2)

## 2018-09-13 LAB — FOLATE: FOLATE: 38 ng/mL (ref >5.9)

## 2018-09-13 LAB — COMPREHENSIVE METABOLIC PANEL
ALT: 9 U/L (ref 0–44)
ANION GAP: 10 (ref 5–15)
AST: 12 U/L — ABNORMAL LOW (ref 15–41)
Albumin: 3.3 g/dL — ABNORMAL LOW (ref 3.5–5.0)
Alkaline Phosphatase: 65 U/L (ref 38–126)
BUN: 19 mg/dL (ref 8–23)
CALCIUM: 9.4 mg/dL (ref 8.9–10.3)
CO2: 26 mmol/L (ref 22–32)
Chloride: 104 mmol/L (ref 98–111)
Creatinine, Ser: 1.17 mg/dL (ref 0.61–1.24)
GFR calc non Af Amer: 57 mL/min — ABNORMAL LOW (ref >60)
GLUCOSE: 123 mg/dL — AB (ref 70–99)
POTASSIUM: 3.3 mmol/L — AB (ref 3.5–5.1)
Sodium: 140 mmol/L (ref 135–145)
Total Bilirubin: 0.4 mg/dL (ref 0.3–1.2)
Total Protein: 7.3 g/dL (ref 6.5–8.1)

## 2018-09-13 LAB — FERRITIN: Ferritin: 118 ng/mL (ref 24–336)

## 2018-09-13 MED ORDER — SODIUM CHLORIDE 0.9 % IV SOLN
1200.0000 mg | Freq: Once | INTRAVENOUS | Status: AC
  Administered 2018-09-13: 1200 mg via INTRAVENOUS
  Filled 2018-09-13: qty 20

## 2018-09-13 MED ORDER — ACETAMINOPHEN 325 MG PO TABS
650.0000 mg | ORAL_TABLET | Freq: Once | ORAL | Status: AC
  Administered 2018-09-13: 650 mg via ORAL

## 2018-09-13 MED ORDER — SODIUM CHLORIDE 0.9 % IV SOLN
Freq: Once | INTRAVENOUS | Status: AC
  Administered 2018-09-13: 10:00:00 via INTRAVENOUS
  Filled 2018-09-13: qty 250

## 2018-09-13 MED ORDER — ACETAMINOPHEN 325 MG PO TABS
ORAL_TABLET | ORAL | Status: AC
  Filled 2018-09-13: qty 2

## 2018-09-13 MED ORDER — HEPARIN SOD (PORK) LOCK FLUSH 100 UNIT/ML IV SOLN
500.0000 [IU] | Freq: Once | INTRAVENOUS | Status: AC | PRN
  Administered 2018-09-13: 500 [IU]

## 2018-09-13 NOTE — Progress Notes (Signed)
Increase pain noted to bladder stem, per wife states

## 2018-09-14 ENCOUNTER — Encounter: Payer: Self-pay | Admitting: Oncology

## 2018-09-14 DIAGNOSIS — D509 Iron deficiency anemia, unspecified: Secondary | ICD-10-CM

## 2018-09-14 HISTORY — DX: Iron deficiency anemia, unspecified: D50.9

## 2018-09-14 NOTE — Progress Notes (Signed)
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 For his prostate cancer he has received IM RT in the past. He has been seeing Dr. Erlene Quan in the past for his recurrent UTIs as well as hematuria. CT scan in July 2018 showed bladder wall thickening with perivascular edema and inflammation in the right ureter greater than the left. Findings were thought to be due to pyelonephritis at that time. He underwent cystoscopy on 12/14/2017 which showed abnormal looking prostate with necrotic material lining the entire surface area. Diffuse copious debris within the bladder appearing to be erythematous without discrete bladder tumor but visualization was poor. He was then admitted to the hospital on 12/26/2017 with symptoms of UTI and sepsis as well as new moderate bilateral hydronephrosis.  CT abdomen and pelvis with contrast on 12/20/2017 again showed market bile bladder wall thickening with perivesicular edema. Within the lumen of the bladder there is a 93.9 cm soft tissue attenuating filling defect. This is indeterminate and could represent an area of blood clot versus urothelial lesion.  He underwent repeat cystoscopy with bilateral pyelogram and ureteral stent placement as well as TURBT and TURP. He was found to have a massive tumor involving the majority of the bladder with grossly necrotic and calcified material. There  appeared to be multifocal disease with large burden of the left anterior bladder wall extending posteriorly as well as adjacent to the bladder neck and beyond the right hemitrigone. Very little normal recognizable bladder mucosa remaining.   Biopsy from TURBT and TURP showed: High-grade urothelial carcinoma with extensive necrosisinvolving both the bladder and the prostate.CT chest did not reveal any evidence of metastatic disease. He was also not found to have any regional adenopathy on CT abdomen  Patient was seen by Dr. Erlene Quan and was not deemed to be a surgical candidate. He has been referred to [place] for definitive treatment options.  Patient lives with his wife at home and ambulates with a cane. He does need assistance with his ADLs to some extent. He has not had any falls and he denies any changes in his appetite or unintentional weight loss. Denies any pain. Reports some fatigue and occasional problems with constipation. He has had 3 hospitalizations last year for urinary tract infections and currently has a chronic Foley for the last 1 month  Dr. Erlene Quan performed interval TURBT on 01/15/18 and was able to debulk tumor as much as possible to reduce tumor burden  Patient received 5 cycles ofcarboplatin/ gemcitabine 2 weeks on and 1 week off ending 04/26/18.Patient did receive radiation for 10 days during cycle 2 of treatment. Given patients age, co-morbidities and frailty- he was not a cisplatin candidate.6th cycle not given due to fall and hip fracture  Patient had a repeat cystoscopy in September 2019 which showed no obvious tumor bladder but it did have a shaggy necrotic appearance at the bladder neck. Prostatic fossa was grossly abnormal necrotic and irregular without papillary change. Bladder neck was biopsied and showed residual high-grade urothelial carcinoma.  Muscularis propria was not seen in that specimen.Due to evidence of recurrent/residual disease patient was  started on Tecentriq on 07/26/2018   Interval history-no new complaints since the last visit he is relatively doing well at home.  He is now coming off his boots and likely to be ambulate better in the next few days.  ECOG PS- 2 Pain scale- 0 Opioid associated constipation- no  Review of systems- Review of Systems  Constitutional: Negative for chills, fever, malaise/fatigue and weight loss.  HENT: Negative for congestion, ear discharge and nosebleeds.   Eyes: Negative for blurred vision.  Respiratory: Negative for cough, hemoptysis, sputum production, shortness of breath and wheezing.   Cardiovascular: Negative for chest pain, palpitations, orthopnea and claudication.  Gastrointestinal: Negative for abdominal pain, blood in stool, constipation, diarrhea, heartburn, melena, nausea and vomiting.  Genitourinary: Negative for dysuria, flank pain, frequency, hematuria and urgency.  Musculoskeletal: Negative for back pain, joint pain and myalgias.  Skin: Negative for rash.  Neurological: Negative for dizziness, tingling, focal weakness, seizures, weakness and headaches.  Endo/Heme/Allergies: Does not bruise/bleed easily.  Psychiatric/Behavioral: Negative for depression and suicidal ideas. The patient does not have insomnia.       Allergies  Allergen Reactions  . Penicillins Other (See Comments)    Caused nervousness as a child. Patient states he took as a teenager w/o problems Has patient had a PCN reaction causing immediate rash, facial/tongue/throat swelling, SOB or lightheadedness with hypotension: No Has patient had a PCN reaction causing severe rash involving mucus membranes or skin necrosis: No Has patient had a PCN reaction that required hospitalization: No Has patient had a PCN reaction occurring within the last 10 years: No If all of the above answers are "NO", then may proceed with     Past Medical History:  Diagnosis Date  . Anxiety   . Arthritis   . Bladder cancer ([place])    . Dysrhythmia 07/2018   history of atrial flutter that worsens with anxiety  . Femur fracture, right ([place]) 05/01/2018  . GERD (gastroesophageal reflux disease)   . History of recent blood transfusion 05/2018  . Hypertension   . Iron deficiency anemia 09/14/2018  . Prostate cancer ([place]) 07/2018   cancer growing in prostate but not prostate cancer, it is from the bladder  . Umbilical hernia 12/6376  . Urinary retention 2019   foley catheter place 11/2017  . UTI (urinary tract infection) 2019   frequent UTI's over last year  . Wound eschar of foot 07/2018   left heal getting wrapped and requiring antibiotic cream. cracks open with weight bearing.     Past Surgical History:  Procedure Laterality Date  . CARPAL TUNNEL RELEASE Right   . CHOLECYSTECTOMY  2004  . CYSTOGRAM  07/18/2018   Procedure: CYSTOGRAM;  Surgeon: [name], MD;  Location: [facility];  Service: Urology;;  . Consuela Mimes W/ RETROGRADES Bilateral 07/18/2018   Procedure: CYSTOSCOPY WITH RETROGRADE PYELOGRAM;  Surgeon: [name], MD;  Location: [facility];  Service: Urology;  Laterality: Bilateral;  . CYSTOSCOPY W/ URETERAL STENT PLACEMENT Bilateral 12/27/2017   Procedure: CYSTOSCOPY WITH RETROGRADE PYELOGRAM/URETERAL STENT PLACEMENT;  Surgeon: [name], MD;  Location: [facility];  Service: Urology;  Laterality: Bilateral;  . CYSTOSCOPY W/ URETERAL STENT PLACEMENT Bilateral 07/18/2018   Procedure: CYSTOSCOPY WITH STENT REPLACEMENT (exchange);  Surgeon: [name], MD;  Location: [facility];  Service: Urology;  Laterality: Bilateral;  . INTRAMEDULLARY (IM) NAIL INTERTROCHANTERIC Right 05/02/2018   Procedure: INTRAMEDULLARY (IM) NAIL INTERTROCHANTRIC;  Surgeon: Marry Guan,  Laurice Record, MD;  Location: [facility];  Service: Orthopedics;  Laterality: Right;  . LEG TENDON SURGERY Right 1958  . PORTA CATH INSERTION N/A 01/22/2018   Procedure: PORTA CATH INSERTION;  Surgeon: [name], MD;  Location: [place] [facility];   Service: Cardiovascular;  Laterality: N/A;  . TRANSURETHRAL RESECTION OF BLADDER TUMOR N/A 12/27/2017   Procedure: TRANSURETHRAL RESECTION OF BLADDER TUMOR (TURBT);  Surgeon: [name], MD;  Location: [facility];  Service: Urology;  Laterality: N/A;  . TRANSURETHRAL RESECTION OF BLADDER TUMOR N/A 01/15/2018   Procedure: TRANSURETHRAL RESECTION OF BLADDER TUMOR (TURBT);  Surgeon: [name], MD;  Location: [facility];  Service: Urology;  Laterality: N/A;  Need 2 hrs for this case please  . TRANSURETHRAL RESECTION OF BLADDER TUMOR N/A 07/18/2018   Procedure: TRANSURETHRAL RESECTION OF BLADDER TUMOR (TURBT);  Surgeon: [name], MD;  Location: [facility];  Service: Urology;  Laterality: N/A;    Social History   Socioeconomic History  . Marital status: Married    Spouse name: ruth  . Number of children: Not on file  . Years of education: Not on file  . Highest education level: Not on file  Occupational History  . Occupation: retired    Comment: Therapist, [employer]  Social Needs  . Financial resource strain: Not on file  . Food insecurity:    Worry: Not on file    Inability: Not on file  . Transportation needs:    Medical: Not on file    Non-medical: Not on file  Tobacco Use  . Smoking status: Never Smoker  . Smokeless tobacco: Never Used  Substance and Sexual Activity  . Alcohol use: No    Alcohol/week: 0.0 standard drinks  . Drug use: No  . Sexual activity: Not Currently  Lifestyle  . Physical activity:    Days per week: Not on file    Minutes per session: Not on file  . Stress: Not on file  Relationships  . Social connections:    Talks on phone: Not on file    Gets together: Not on file    Attends religious service: Not on file    Active member of club or organization: Not on file    Attends meetings of clubs or organizations: Not on file    Relationship status: Not on file  . Intimate partner violence:    Fear of current or ex partner: Not on file    Emotionally  abused: Not on file    Physically abused: Not on file    Forced sexual activity: Not on file  Other Topics Concern  . Not on file  Social History Narrative  . Not on file    Family History  Problem Relation Age of Onset  . Cancer Mother   . Chronic Renal Failure Mother   . Heart disease Father      Current Outpatient Medications:  .  cetirizine (ZYRTEC) 10 MG tablet, Take 10 mg by mouth daily., Disp: , Rfl:  .  CRANBERRY PO, Take 750 mg by mouth 2 (two) times daily. , Disp: , Rfl:  .  docusate sodium (COLACE) 100 MG capsule, Take 1 capsule (100 mg total) by mouth 2 (two) times daily., Disp: 10 capsule, Rfl: 0 .  ferrous sulfate 325 (65 FE) MG tablet, Take 1 tablet (325 mg total) by mouth 2 (two) times daily with a meal., Disp: , Rfl: 3 .  Multiple Vitamin (MULTIVITAMIN WITH MINERALS) TABS tablet, Take 1 tablet by  mouth daily., Disp: , Rfl:  .  MYRBETRIQ 50 MG TB24 tablet, TAKE 1 TABLET DAILY, Disp: 30 tablet, Rfl: 3 .  polyethylene glycol powder (MIRALAX) powder, Take 17 g by mouth daily as needed. Can increase to 3 times a day as needed for constipation but hold medication if has diarrhea, Disp: 255 g, Rfl: 0 .  vitamin B-12 1000 MCG tablet, Take 1 tablet (1,000 mcg total) by mouth daily. (Patient taking differently: Take 1,000 mcg by mouth every evening. ), Disp: , Rfl:  .  acetaminophen (TYLENOL) 500 MG tablet, Take 1,000 mg by mouth every 6 (six) hours as needed (for pain.)., Disp: , Rfl:  .  metoCLOPramide (REGLAN) 5 MG tablet, Take 1-2 tablets (5-10 mg total) by mouth every 8 (eight) hours as needed for nausea (if ondansetron (ZOFRAN) ineffective.). (Patient not taking: Reported on 07/06/2018), Disp: , Rfl:  .  neomycin-bacitracin-polymyxin (NEOSPORIN) ointment, Apply topically., Disp: , Rfl:  .  NYAMYC powder, APPLY TOPICALLY TWICE DAILY (Patient not taking: Reported on 09/13/2018), Disp: 30 g, Rfl: 0 .  ondansetron (ZOFRAN) 4 MG tablet, Take 1 tablet (4 mg total) by mouth every 6  (six) hours as needed for nausea. (Patient not taking: Reported on 07/12/2018), Disp: 20 tablet, Rfl: 0 .  oxyCODONE (OXY IR/ROXICODONE) 5 MG immediate release tablet, Take 1-2 tablets (5-10 mg total) by mouth every 4 (four) hours as needed for moderate pain (pain score 4-6). (Patient not taking: Reported on 07/06/2018), Disp: 30 tablet, Rfl: 0 .  pantoprazole (PROTONIX) 40 MG tablet, Take 1 tablet (40 mg total) by mouth daily. (Patient not taking: Reported on 07/26/2018), Disp: , Rfl:   Physical exam:  Vitals:   09/13/18 0917  BP: 124/71  Pulse: 97  Resp: 18  Temp: 97.8 F (36.6 C)  TempSrc: Tympanic  Height: 5\' 11"  (1.803 m)   Physical Exam  Constitutional: He is oriented to person, place, and time. He appears well-developed and well-nourished.  HENT:  Head: Normocephalic and atraumatic.  Eyes: Pupils are equal, round, and reactive to light. EOM are normal.  Neck: Normal range of motion.  Cardiovascular: Normal rate, regular rhythm and normal heart sounds.  Pulmonary/Chest: Effort normal and breath sounds normal.  Abdominal: Soft. Bowel sounds are normal.  Foley in place draining clear urine  Neurological: He is alert and oriented to person, place, and time.  Skin: Skin is warm and dry.     CMP Latest Ref Rng & Units 09/13/2018  Glucose 70 - 99 mg/dL 123(H)  BUN 8 - 23 mg/dL 19  Creatinine 0.61 - 1.24 mg/dL 1.17  Sodium 135 - 145 mmol/L 140  Potassium 3.5 - 5.1 mmol/L 3.3(L)  Chloride 98 - 111 mmol/L 104  CO2 22 - 32 mmol/L 26  Calcium 8.9 - 10.3 mg/dL 9.4  Total Protein 6.5 - 8.1 g/dL 7.3  Total Bilirubin 0.3 - 1.2 mg/dL 0.4  Alkaline Phos 38 - 126 U/L 65  AST 15 - 41 U/L 12(L)  ALT 0 - 44 U/L 9   CBC Latest Ref Rng & Units 09/13/2018  WBC 4.0 - 10.5 K/uL 11.7(H)  Hemoglobin 13.0 - 17.0 g/dL 10.7(L)  Hematocrit 39.0 - 52.0 % 33.9(L)  Platelets 150 - 400 K/uL 259     Assessment and plan- Patient is a 83 y.o. male with locally advanced muscle invasive high-grade  urothelial carcinoma stage [id number] M0s/p 5 cycles of gemcitabine and carboplatinnow with residual/ recuurent tumor.  He is here for on treatment assessment prior to cycle  3 of maintenance Tecentriq  Counts okay to proceed with cycle 3 of maintenance Tecentriq today.  I will see him back in 3 weeks for cycle #4.  I will plan to get scans after 4 cycles.  Normocytic anemia: I will check iron studies B12 and folate today   Visit Diagnosis 1. Urothelial cancer ([place])   2. Iron deficiency anemia, unspecified iron deficiency anemia type   3. Encounter for antineoplastic immunotherapy      Dr. Randa Evens, MD, MPH [facility] [phone] 09/14/2018 11:28 AM

## 2018-09-25 ENCOUNTER — Other Ambulatory Visit: Payer: Self-pay | Admitting: Urology

## 2018-09-26 NOTE — Telephone Encounter (Signed)
Pt wife called in say he needs [facility]   [facility] powder [[id number]]

## 2018-10-07 ENCOUNTER — Encounter: Payer: Self-pay | Admitting: Emergency Medicine

## 2018-10-07 ENCOUNTER — Other Ambulatory Visit: Payer: Self-pay

## 2018-10-07 ENCOUNTER — Emergency Department
Admission: EM | Admit: 2018-10-07 | Discharge: 2018-10-07 | Disposition: A | Discharge status: Home / Self Care | Payer: Medicare Other | Attending: Emergency Medicine | Admitting: Emergency Medicine

## 2018-10-07 DIAGNOSIS — C61 Malignant neoplasm of prostate: Secondary | ICD-10-CM | POA: Insufficient documentation

## 2018-10-07 DIAGNOSIS — Z79899 Other long term (current) drug therapy: Secondary | ICD-10-CM | POA: Diagnosis not present

## 2018-10-07 DIAGNOSIS — Z436 Encounter for attention to other artificial openings of urinary tract: Secondary | ICD-10-CM | POA: Insufficient documentation

## 2018-10-07 DIAGNOSIS — T83021A Displacement of indwelling urethral catheter, initial encounter: Secondary | ICD-10-CM

## 2018-10-07 DIAGNOSIS — I10 Essential (primary) hypertension: Secondary | ICD-10-CM | POA: Insufficient documentation

## 2018-10-07 DIAGNOSIS — C679 Malignant neoplasm of bladder, unspecified: Secondary | ICD-10-CM | POA: Insufficient documentation

## 2018-10-07 NOTE — ED Triage Notes (Signed)
Pt has foley catheter for bladder cancer.  Foley came out about one hour ago per family.  Catheter was intact.  Only here to have foley replaced. No symptoms. No other needs.

## 2018-10-07 NOTE — ED Notes (Signed)
Pt bladder scanned - 0 ml in bladder. Pt's depends soaked so pt cleaned and changed. Family states pt has no problem with retention.

## 2018-10-07 NOTE — Discharge Instructions (Signed)
Your foley (coude') catheter has been dislodged. You should follow-up with Dr. Erlene Quan tomorrow for evaluation and replacement if necessary. Return to the ED for any concerns including pain, pressure and urinary retention.

## 2018-10-07 NOTE — ED Provider Notes (Signed)
[facility] Emergency Department Provider Note ____________________________________________  Time seen: 36  I have reviewed the triage vital signs and the nursing notes.  HISTORY  Chief Complaint  urinary catheter fell out  HPI Vincent Poole is a 83 y.o. male a history of invasive bladder and prostate cancer, presents to the ED accompanied by his wife and adult daughter, for Foley catheter replacement.  Patient reports that he had a catheter placed back on September 11, during a cystoscopy and cystogram for replacement of his urethral stents.  Catheter has been in place since that time, and has not been changed, the bag has been changed monthly at  Rehab. Complicating the patient's history is he had a recent hip fracture and has been at [facility], for rehab since June 25.  The patient is scheduled to see Dr. [name] on December 17, for follow-up visit.  He is also scheduled to have a chemo infusion on 12/5.  Patient has not been evaluated despite intermittent complaints of a leaky catheter since it was placed in September.  The wife reports that the catheter failed today, and she has the catheter present, noting that the balloon is ruptured. Patient denies any dysuria, hematuria, pain, or pressure.  She denies any interim fevers, chills, or sweats.  Past Medical History:  Diagnosis Date  . Anxiety   . Arthritis   . Bladder cancer ([place])   . Dysrhythmia 07/2018   history of atrial flutter that worsens with anxiety  . Femur fracture, right ([place]) 05/01/2018  . GERD (gastroesophageal reflux disease)   . History of recent blood transfusion 05/2018  . Hypertension   . Iron deficiency anemia 09/14/2018  . Prostate cancer ([place]) 07/2018   cancer growing in prostate but not prostate cancer, it is from the bladder  . Umbilical hernia 56/3875  . Urinary retention 2019   foley catheter place 11/2017  . UTI (urinary tract infection) 2019   frequent UTI's over  last year  . Wound eschar of foot 07/2018   left heal getting wrapped and requiring antibiotic cream. cracks open with weight bearing.    Patient Active Problem List   Diagnosis Date Noted  . Iron deficiency anemia 09/14/2018  . Hip fracture ([place]) 05/01/2018  . Malignant neoplasm of urinary bladder ([place]) 01/12/2018  . Goals of care, counseling/discussion 01/12/2018  . Pyelonephritis 05/31/2017  . UTI (urinary tract infection) 03/04/2017  . Urinary obstruction   . Essential hypertension 08/30/2016  . History of shingles 08/30/2016  . Prostate cancer ([place]) 08/30/2016  . Urinary retention 08/30/2016  . Medicare annual wellness visit, initial 08/01/2016  . Medicare annual wellness visit, subsequent 08/01/2016  . Sepsis ([place]) 07/11/2016  . Borderline diabetes mellitus 01/26/2016  . Vaccine counseling 01/26/2015  . Lumbar radiculitis 06/24/2014  . OA (osteoarthritis) of hip 06/24/2014  . Malignant neoplasm of prostate ([place]) 01/27/2011    Past Surgical History:  Procedure Laterality Date  . CARPAL TUNNEL RELEASE Right   . CHOLECYSTECTOMY  2004  . CYSTOGRAM  07/18/2018   Procedure: CYSTOGRAM;  Surgeon: [name], MD;  Location: [facility] OR[facility];  Service: Urology;;  . Consuela Mimes W/ RETROGRADES Bilateral 07/18/2018   Procedure: CYSTOSCOPY WITH RETROGRADE PYELOGRAM;  Surgeon: [name], MD;  Location: [facility] OR[facility];  Service: Urology;  Laterality: Bilateral;  . CYSTOSCOPY W/ URETERAL STENT PLACEMENT Bilateral 12/27/2017   Procedure: CYSTOSCOPY WITH RETROGRADE PYELOGRAM/URETERAL STENT PLACEMENT;  Surgeon: [name], MD;  Location: [facility] OR[facility];  Service: Urology;  Laterality: Bilateral;  .  CYSTOSCOPY W/ URETERAL STENT PLACEMENT Bilateral 07/18/2018   Procedure: CYSTOSCOPY WITH STENT REPLACEMENT (exchange);  Surgeon: [name], MD;  Location: [facility];  Service: Urology;  Laterality: Bilateral;  . INTRAMEDULLARY (IM) NAIL INTERTROCHANTERIC Right 05/02/2018   Procedure: INTRAMEDULLARY  (IM) NAIL INTERTROCHANTRIC;  Surgeon: [name], MD;  Location: [facility] OR[facility];  Service: Orthopedics;  Laterality: Right;  . LEG TENDON SURGERY Right 1958  . PORTA CATH INSERTION N/A 01/22/2018   Procedure: PORTA CATH INSERTION;  Surgeon: [name], MD;  Location: [facility];  Service: Cardiovascular;  Laterality: N/A;  . TRANSURETHRAL RESECTION OF BLADDER TUMOR N/A 12/27/2017   Procedure: TRANSURETHRAL RESECTION OF BLADDER TUMOR (TURBT);  Surgeon: [name], MD;  Location: [facility];  Service: Urology;  Laterality: N/A;  . TRANSURETHRAL RESECTION OF BLADDER TUMOR N/A 01/15/2018   Procedure: TRANSURETHRAL RESECTION OF BLADDER TUMOR (TURBT);  Surgeon: [name], MD;  Location: [facility];  Service: Urology;  Laterality: N/A;  Need 2 hrs for this case please  . TRANSURETHRAL RESECTION OF BLADDER TUMOR N/A 07/18/2018   Procedure: TRANSURETHRAL RESECTION OF BLADDER TUMOR (TURBT);  Surgeon: [name], MD;  Location: [facility];  Service: Urology;  Laterality: N/A;    Prior to Admission medications   Medication Sig Start Date End Date Taking? Authorizing Provider  acetaminophen (TYLENOL) 500 MG tablet Take 1,000 mg by mouth every 6 (six) hours as needed (for pain.).    [provider]  cetirizine (ZYRTEC) 10 MG tablet Take 10 mg by mouth daily.    [provider]  CRANBERRY PO Take 750 mg by mouth 2 (two) times daily.     [provider]  docusate sodium (COLACE) 100 MG capsule Take 1 capsule (100 mg total) by mouth 2 (two) times daily. 05/06/18   [name], MD  ferrous sulfate 325 (65 FE) MG tablet Take 1 tablet (325 mg total) by mouth 2 (two) times daily with a meal. 05/06/18   Gouru, Illene Silver, MD  metoCLOPramide (REGLAN) 5 MG tablet Take 1-2 tablets (5-10 mg total) by mouth every 8 (eight) hours as needed for nausea (if ondansetron (ZOFRAN) ineffective.). Patient not taking: Reported on 07/06/2018 05/06/18   [name], MD  Multiple Vitamin  (MULTIVITAMIN WITH MINERALS) TABS tablet Take 1 tablet by mouth daily.    [provider]  MYRBETRIQ 50 MG TB24 tablet TAKE 1 TABLET DAILY 07/29/18   Zara Council A, PA-C  neomycin-bacitracin-polymyxin (NEOSPORIN) ointment Apply topically.    [provider]  nystatin [facility]) powder Apply topically 2 (two) times daily. 09/28/18   Zara Council A, PA-C  ondansetron (ZOFRAN) 4 MG tablet Take 1 tablet (4 mg total) by mouth every 6 (six) hours as needed for nausea. Patient not taking: Reported on 07/12/2018 05/06/18   [name], MD  oxyCODONE (OXY IR/ROXICODONE) 5 MG immediate release tablet Take 1-2 tablets (5-10 mg total) by mouth every 4 (four) hours as needed for moderate pain (pain score 4-6). Patient not taking: Reported on 07/06/2018 05/06/18   [name], MD  pantoprazole (PROTONIX) 40 MG tablet Take 1 tablet (40 mg total) by mouth daily. Patient not taking: Reported on 07/26/2018 05/06/18   [name], MD  polyethylene glycol powder (MIRALAX) powder Take 17 g by mouth daily as needed. Can increase to 3 times a day as needed for constipation but hold medication if has diarrhea 01/30/18   Sindy Guadeloupe, MD  vitamin B-12 1000 MCG tablet Take 1 tablet (1,000 mcg total) by mouth  daily. Patient taking differently: Take 1,000 mcg by mouth every evening.  05/07/18   [name], MD    Allergies Penicillins  Family History  Problem Relation Age of Onset  . Cancer Mother   . Chronic Renal Failure Mother   . Heart disease Father     Social History Social History   Tobacco Use  . Smoking status: Never Smoker  . Smokeless tobacco: Never Used  Substance Use Topics  . Alcohol use: No    Alcohol/week: 0.0 standard drinks  . Drug use: No    Review of Systems  Constitutional: Negative for fever. Cardiovascular: Negative for chest pain. Respiratory: Negative for shortness of breath. Gastrointestinal: Negative for abdominal pain, vomiting and  diarrhea. Genitourinary: Negative for dysuria, hematuria, or incontinence Musculoskeletal: Negative for back pain. Skin: Negative for rash. Neurological: Negative for headaches, focal weakness or numbness. ____________________________________________  PHYSICAL EXAM:  VITAL SIGNS: ED Triage Vitals  Enc Vitals Group     BP 10/07/18 1412 122/67     Pulse Rate 10/07/18 1412 (!) 108     Resp 10/07/18 1412 18     Temp 10/07/18 1412 97.7 F (36.5 C)     Temp Source 10/07/18 1412 Oral     SpO2 10/07/18 1412 100 %     Weight 10/07/18 1410 185 lb (83.9 kg)     Height 10/07/18 1410 5\' 11"  (1.803 m)     Head Circumference --      Peak Flow --      Pain Score 10/07/18 1410 0     Pain Loc --      Pain Edu? --      Excl. in [place]? --    Constitutional: Alert and oriented. Well appearing and in no distress. Head: Normocephalic and atraumatic. Eyes: Conjunctivae are normal. Normal extraocular movements Cardiovascular: Normal rate, regular rhythm. Normal distal pulses. Respiratory: Normal respiratory effort. No wheezes/rales/rhonchi. Gastrointestinal: Soft and nontender. No distention. Musculoskeletal: Nontender with normal range of motion in all extremities.  Neurologic:  Normal gait without ataxia. Normal speech and language. No gross focal neurologic deficits are appreciated. Skin:  Skin is warm, dry and intact. No rash noted. ____________________________________________   LABS (pertinent positives/negatives) ____________________________________________  PROCEDURES  Procedures Bladder Scan - 0 ml Undergarment wet - cleaned & changed ____________________________________________  INITIAL IMPRESSION / ASSESSMENT AND PLAN / ED COURSE  Patient with ED evaluation of Foley catheter problem.  Patient describes he lost his indwelling Foley cath that had been in place since September.  Patient presents today without any complaints of bladder pain, pressure, or retention.  We discussed the  option of calling the on-call urologist to have the catheter replaced under the cystoscopy.  Patient and his wife discussed the option and have opted to see Dr. Erlene Quan in the morning.  Vision and his wife are discharged with follow-up instructions and strict return precautions. ____________________________________________  FINAL CLINICAL IMPRESSION(S) / ED DIAGNOSES  Final diagnoses:  Dislodged Foley catheter, initial encounter [facility])      Carmie End, Dannielle Karvonen, PA-C 10/07/18 1736    Arta Silence, MD 10/07/18 2043

## 2018-10-07 NOTE — ED Notes (Signed)
Pt has hx of bladder CA. Pt presents w/ non-intact balloon on the #18 Fr coude catheter. Pt in no acute discomfort at this time. Pt has hx of radiation to pelvis for bladder CA. Catheter reported to have been in place since Sept of this year, without being changed. PA-C aware of increased risk factors and plan is to contact urology to replace catheter.

## 2018-10-08 ENCOUNTER — Ambulatory Visit (INDEPENDENT_AMBULATORY_CARE_PROVIDER_SITE_OTHER): Payer: Medicare Other | Admitting: Urology

## 2018-10-08 ENCOUNTER — Encounter: Payer: Self-pay | Admitting: Urology

## 2018-10-08 VITALS — BP 115/69 | HR 134 | Ht 71.0 in

## 2018-10-08 DIAGNOSIS — N3289 Other specified disorders of bladder: Secondary | ICD-10-CM | POA: Diagnosis not present

## 2018-10-08 DIAGNOSIS — N135 Crossing vessel and stricture of ureter without hydronephrosis: Secondary | ICD-10-CM | POA: Diagnosis not present

## 2018-10-08 DIAGNOSIS — B356 Tinea cruris: Secondary | ICD-10-CM | POA: Diagnosis not present

## 2018-10-08 DIAGNOSIS — C678 Malignant neoplasm of overlapping sites of bladder: Secondary | ICD-10-CM | POA: Diagnosis not present

## 2018-10-08 NOTE — Progress Notes (Signed)
10/08/2018 1:55 PM   [name] November 17, 1934 [id number]  Referring provider: [name], MD [facility] [place], [place] [address]  Chief Complaint  Patient presents with  . 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    He was initially diagnosed on 12/27/2017 at which time indwelling ureteral stents were placed for bilateral ureteral obstruction.  He underwent TURBT on 01/15/18 for the purpose of debulking.  He received 5 cycles of carboplatin/ gemcitabine 2 weeks on and 1 week off ending 04/26/18. Patient did receive radiation for 10 days during cycle 2 of treatment. Given patients age, co-morbidities and frailty- he was not a cisplatin candidate.  More recently, he has had a medical setback with a fall and right hip fracture in 04/2018.  He did rehab a [facility], but he is now living at home.     He is scheduled for restaging imaging on 07/12/2018.  He underwent cysto, RTG's, ureteral stent exchange, bladder neck biopsy and cystogram on 07/18/2018.   Biopsy positive for recurrent high grade urolthelial carcinoma.    He has started on Tecentriq.    He was seen in the ED yesterday as his catheter fell out.  He presents today to have it replaced.    Overall, he is doing fair.  PMH: Past Medical History:  Diagnosis Date  . Anxiety   . Arthritis   . Bladder cancer ([place])   . Dysrhythmia 07/2018   history of atrial flutter that worsens with anxiety  . Femur fracture, right ([place]) 05/01/2018  . GERD (gastroesophageal reflux disease)   . History of recent blood transfusion 05/2018  . Hypertension   . Iron deficiency anemia 09/14/2018  . Prostate cancer ([place]) 07/2018   cancer growing in prostate but not prostate cancer, it is from the bladder  . Umbilical hernia 78/2423  . Urinary retention 2019   foley catheter place 11/2017  . UTI (urinary tract infection) 2019   frequent UTI's over last year  . Wound eschar of foot 07/2018   left heal getting wrapped and requiring antibiotic cream. cracks open with weight bearing.    Surgical History: Past Surgical History:  Procedure Laterality Date  . CARPAL TUNNEL RELEASE Right   . CHOLECYSTECTOMY  2004  . CYSTOGRAM  07/18/2018   Procedure: CYSTOGRAM;  Surgeon: [name], MD;  Location: [facility];  Service: Urology;;  . Consuela Mimes W/ RETROGRADES Bilateral 07/18/2018   Procedure: CYSTOSCOPY WITH RETROGRADE PYELOGRAM;  Surgeon: [name], MD;  Location: [facility];  Service: Urology;  Laterality: Bilateral;  . CYSTOSCOPY W/ URETERAL STENT PLACEMENT Bilateral 12/27/2017   Procedure: CYSTOSCOPY WITH RETROGRADE PYELOGRAM/URETERAL STENT PLACEMENT;  Surgeon: [name], MD;  Location: [facility];  Service: Urology;  Laterality: Bilateral;  . CYSTOSCOPY W/ URETERAL STENT PLACEMENT Bilateral 07/18/2018   Procedure: CYSTOSCOPY WITH STENT REPLACEMENT (exchange);  Surgeon: [name], MD;  Location: [facility];  Service: Urology;  Laterality: Bilateral;  . INTRAMEDULLARY (IM) NAIL INTERTROCHANTERIC Right 05/02/2018   Procedure: INTRAMEDULLARY (IM) NAIL INTERTROCHANTRIC;  Surgeon: [name], MD;  Location: [facility];  Service: Orthopedics;  Laterality: Right;  . LEG TENDON SURGERY Right 1958  . PORTA CATH INSERTION N/A 01/22/2018   Procedure: PORTA CATH INSERTION;  Surgeon: [name], MD;  Location: [facility];  Service: Cardiovascular;  Laterality: N/A;  . TRANSURETHRAL  RESECTION OF BLADDER TUMOR N/A 12/27/2017   Procedure: TRANSURETHRAL RESECTION OF BLADDER TUMOR (TURBT);  Surgeon: [name], MD;  Location: [facility];  Service: Urology;  Laterality: N/A;  . TRANSURETHRAL RESECTION OF BLADDER TUMOR N/A 01/15/2018   Procedure: TRANSURETHRAL RESECTION OF BLADDER TUMOR (TURBT);  Surgeon: [name], MD;  Location: [facility];   Service: Urology;  Laterality: N/A;  Need 2 hrs for this case please  . TRANSURETHRAL RESECTION OF BLADDER TUMOR N/A 07/18/2018   Procedure: TRANSURETHRAL RESECTION OF BLADDER TUMOR (TURBT);  Surgeon: [name], MD;  Location: [facility];  Service: Urology;  Laterality: N/A;    Home Medications:  Allergies as of 10/08/2018      Reactions   Penicillins Other (See Comments)   Caused nervousness as a child. Patient states he took as a teenager w/o problems Has patient had a PCN reaction causing immediate rash, facial/tongue/throat swelling, SOB or lightheadedness with hypotension: No Has patient had a PCN reaction causing severe rash involving mucus membranes or skin necrosis: No Has patient had a PCN reaction that required hospitalization: No Has patient had a PCN reaction occurring within the last 10 years: No If all of the above answers are "NO", then may proceed with      Medication List        Accurate as of 10/08/18  1:55 PM. Always use your most recent med list.          acetaminophen 500 MG tablet Commonly known as:  TYLENOL Take 1,000 mg by mouth every 6 (six) hours as needed (for pain.).   cetirizine 10 MG tablet Commonly known as:  ZYRTEC Take 10 mg by mouth daily.   CRANBERRY PO Take 750 mg by mouth 2 (two) times daily.   cyanocobalamin 1000 MCG tablet Take 1 tablet (1,000 mcg total) by mouth daily.   docusate sodium 100 MG capsule Commonly known as:  COLACE Take 1 capsule (100 mg total) by mouth 2 (two) times daily.   ferrous sulfate 325 (65 FE) MG tablet Take 1 tablet (325 mg total) by mouth 2 (two) times daily with a meal.   metoCLOPramide 5 MG tablet Commonly known as:  REGLAN Take 1-2 tablets (5-10 mg total) by mouth every 8 (eight) hours as needed for nausea (if ondansetron (ZOFRAN) ineffective.).   multivitamin with minerals Tabs tablet Take 1 tablet by mouth daily.   MYRBETRIQ 50 MG Tb24 tablet Generic drug:  mirabegron ER TAKE 1 TABLET  DAILY   neomycin-bacitracin-polymyxin ointment Commonly known as:  NEOSPORIN Apply topically.   nystatin powder Commonly known as:  MYCOSTATIN/NYSTOP Apply topically 2 (two) times daily.   ondansetron 4 MG tablet Commonly known as:  ZOFRAN Take 1 tablet (4 mg total) by mouth every 6 (six) hours as needed for nausea.   oxyCODONE 5 MG immediate release tablet Commonly known as:  Oxy IR/ROXICODONE Take 1-2 tablets (5-10 mg total) by mouth every 4 (four) hours as needed for moderate pain (pain score 4-6).   pantoprazole 40 MG tablet Commonly known as:  PROTONIX Take 1 tablet (40 mg total) by mouth daily.   polyethylene glycol powder powder Commonly known as:  GLYCOLAX/MIRALAX Take 17 g by mouth daily as needed. Can increase to 3 times a day as needed for constipation but hold medication if has diarrhea       Allergies:  Allergies  Allergen Reactions  . Penicillins Other (See Comments)    Caused nervousness as a child. Patient states he took as a teenager  w/o problems Has patient had a PCN reaction causing immediate rash, facial/tongue/throat swelling, SOB or lightheadedness with hypotension: No Has patient had a PCN reaction causing severe rash involving mucus membranes or skin necrosis: No Has patient had a PCN reaction that required hospitalization: No Has patient had a PCN reaction occurring within the last 10 years: No If all of the above answers are "NO", then may proceed with    Family History: Family History  Problem Relation Age of Onset  . Cancer Mother   . Chronic Renal Failure Mother   . Heart disease Father     Social History:  reports that he has never smoked. He has never used smokeless tobacco. He reports that he does not drink alcohol or use drugs.  ROS: UROLOGY Frequent Urination?: Yes Hard to postpone urination?: Yes Burning/pain with urination?: Yes Get up at night to urinate?: No Leakage of urine?: Yes Urine stream starts and stops?:  No Trouble starting stream?: No Do you have to strain to urinate?: No Blood in urine?: No Urinary tract infection?: No Sexually transmitted disease?: No Injury to kidneys or bladder?: No Painful intercourse?: No Weak stream?: No Erection problems?: No Penile pain?: No  Gastrointestinal Nausea?: Yes Vomiting?: Yes Indigestion/heartburn?: No Diarrhea?: No Constipation?: No  Constitutional Fever: No Night sweats?: No Weight loss?: No Fatigue?: No  Skin Skin rash/lesions?: No Itching?: No  Eyes Blurred vision?: No Double vision?: No  Ears/Nose/Throat Sore throat?: No Sinus problems?: No  Hematologic/Lymphatic Swollen glands?: No Easy bruising?: No  Cardiovascular Leg swelling?: No Chest pain?: No  Respiratory Cough?: No Shortness of breath?: No  Endocrine Excessive thirst?: No  Musculoskeletal Back pain?: No Joint pain?: No  Neurological Headaches?: No Dizziness?: No  Psychologic Depression?: No Anxiety?: Yes  Physical Exam: BP 115/69 (BP Location: Left Arm, Patient Position: Sitting, Cuff Size: Normal)   Pulse (!) 134   Ht 5\' 11"  (1.803 m)   BMI 25.80 kg/m   Constitutional: Well nourished. Alert and oriented, No acute distress.  Smells strongly of urine and yeast.   HEENT:  AT, moist mucus membranes. Trachea midline, no masses. Cardiovascular: No clubbing, cyanosis, or edema. Respiratory: Normal respiratory effort, no increased work of breathing. GI: Abdomen is soft, non tender, non distended, no abdominal masses. Liver and spleen not palpable.  No hernias appreciated.  Stool sample for occult testing is not indicated.   GU: No CVA tenderness.  No bladder fullness or masses.  Patient with uncircumcised phallus.   Foreskin easily retracted  Urethral meatus is patent.  No penile discharge. No penile lesions or rashes. Scrotum without lesions, cysts, rashes and/or edema.  Testicles are located scrotally bilaterally. No masses are appreciated in  the testicles. Left and right epididymis are normal.  Erythematous, scaling plaques on medial thighs, inguinal folds and pubic area with areas of clearing with the large plaques with crusted Nystatin in the creases Rectal: deferred.  Skin: No rashes, bruises or suspicious lesions. Lymph: No cervical or inguinal adenopathy. Neurologic: Grossly intact, no focal deficits, moving all 4 extremities. Psychiatric: Normal mood and affect.   Laboratory Data: Lab Results  Component Value Date   WBC 11.7 (H) 09/13/2018   HGB 10.7 (L) 09/13/2018   HCT 33.9 (L) 09/13/2018   MCV 90.2 09/13/2018   PLT 259 09/13/2018    Lab Results  Component Value Date   CREATININE 1.17 09/13/2018    Lab Results  Component Value Date   PSA 0.02 12/22/2016   PSA 0.05 12/24/2015  PSA 0.05 05/27/2015    Urinalysis N/a  Pertinent Imaging: No new recent interval imaging  Procedure Cath Change/ Replacement Patient is present today for a catheter placement due to urinary retention.  Patient was cleaned and prepped in a sterile fashion with betadine and 2% lidocaine jelly was instilled into the urethra. An 66 FR Coude cath was replaced into the bladder no complications were noted Urine return was noted  100 ml and urine was yellow cloudy in color. The balloon was filled with 56ml of sterile water. An overnight  bag was attached for drainage.  A night bag was also given to the patient and patient was given instruction on how to change from one bag to another. Patient was given proper instruction on catheter care.    Preformed by: Zara Council, PA-C   Assessment & Plan:    1. Malignant neoplasm of overlapping sites of bladder [facility]) Continue follow ups with Dr. [name] (10/11/2018) and Dr. Erlene Quan (10/23/2018)    2. Bilateral ureteral obstruction Status post stent exchange on 07/18/2018 and has a follow up appointment with Dr. Erlene Quan on 10/23/2018 to discuss stent exchange  3. Contracted bladder Manage  with a Coude catheter with monthly exchanges New Coude catheter placed today  4. Tinea cruris Patient's wife is applying nystatin cream, but it is hard to keep on top of it as he is having continuous leakage due to his bladder spasms and not having a Foley in place Patient will continue having the nystatin cream applied and wife will continue to cleanse the area with soapy water and dry completely between applications and we will reassess when he returns in 1 month for his catheter exchange    Zara Council, PA-C  [place] [address], [place], [place] [address] [phone]

## 2018-10-11 ENCOUNTER — Other Ambulatory Visit: Payer: Self-pay

## 2018-10-11 ENCOUNTER — Inpatient Hospital Stay: Payer: Medicare Other | Attending: Oncology

## 2018-10-11 ENCOUNTER — Inpatient Hospital Stay: Payer: Medicare Other

## 2018-10-11 ENCOUNTER — Encounter: Payer: Self-pay | Admitting: Oncology

## 2018-10-11 ENCOUNTER — Other Ambulatory Visit: Payer: Self-pay | Admitting: *Deleted

## 2018-10-11 ENCOUNTER — Inpatient Hospital Stay (HOSPITAL_BASED_OUTPATIENT_CLINIC_OR_DEPARTMENT_OTHER): Payer: Medicare Other | Admitting: Oncology

## 2018-10-11 VITALS — BP 113/72 | HR 103 | Temp 95.8°F | Resp 18 | Wt 168.0 lb

## 2018-10-11 DIAGNOSIS — M1990 Unspecified osteoarthritis, unspecified site: Secondary | ICD-10-CM

## 2018-10-11 DIAGNOSIS — Z5112 Encounter for antineoplastic immunotherapy: Secondary | ICD-10-CM

## 2018-10-11 DIAGNOSIS — R5383 Other fatigue: Secondary | ICD-10-CM

## 2018-10-11 DIAGNOSIS — R54 Age-related physical debility: Secondary | ICD-10-CM | POA: Insufficient documentation

## 2018-10-11 DIAGNOSIS — D509 Iron deficiency anemia, unspecified: Secondary | ICD-10-CM

## 2018-10-11 DIAGNOSIS — N179 Acute kidney failure, unspecified: Secondary | ICD-10-CM | POA: Diagnosis not present

## 2018-10-11 DIAGNOSIS — Z87440 Personal history of urinary (tract) infections: Secondary | ICD-10-CM | POA: Insufficient documentation

## 2018-10-11 DIAGNOSIS — E876 Hypokalemia: Secondary | ICD-10-CM

## 2018-10-11 DIAGNOSIS — Z8546 Personal history of malignant neoplasm of prostate: Secondary | ICD-10-CM

## 2018-10-11 DIAGNOSIS — C689 Malignant neoplasm of urinary organ, unspecified: Secondary | ICD-10-CM

## 2018-10-11 DIAGNOSIS — C7982 Secondary malignant neoplasm of genital organs: Secondary | ICD-10-CM

## 2018-10-11 DIAGNOSIS — Z79899 Other long term (current) drug therapy: Secondary | ICD-10-CM | POA: Insufficient documentation

## 2018-10-11 DIAGNOSIS — I10 Essential (primary) hypertension: Secondary | ICD-10-CM | POA: Diagnosis not present

## 2018-10-11 DIAGNOSIS — R112 Nausea with vomiting, unspecified: Secondary | ICD-10-CM

## 2018-10-11 DIAGNOSIS — N136 Pyonephrosis: Secondary | ICD-10-CM

## 2018-10-11 DIAGNOSIS — C678 Malignant neoplasm of overlapping sites of bladder: Secondary | ICD-10-CM | POA: Insufficient documentation

## 2018-10-11 DIAGNOSIS — R5381 Other malaise: Secondary | ICD-10-CM | POA: Insufficient documentation

## 2018-10-11 DIAGNOSIS — R748 Abnormal levels of other serum enzymes: Secondary | ICD-10-CM

## 2018-10-11 DIAGNOSIS — Z9181 History of falling: Secondary | ICD-10-CM | POA: Diagnosis not present

## 2018-10-11 DIAGNOSIS — K219 Gastro-esophageal reflux disease without esophagitis: Secondary | ICD-10-CM | POA: Diagnosis not present

## 2018-10-11 LAB — CBC WITH DIFFERENTIAL/PLATELET
Abs Immature Granulocytes: 0.04 10*3/uL (ref 0.00–0.07)
Basophils Absolute: 0 10*3/uL (ref 0.0–0.1)
Basophils Relative: 0 %
EOS ABS: 0.1 10*3/uL (ref 0.0–0.5)
Eosinophils Relative: 1 %
HCT: 34.2 % — ABNORMAL LOW (ref 39.0–52.0)
Hemoglobin: 10.8 g/dL — ABNORMAL LOW (ref 13.0–17.0)
Immature Granulocytes: 0 %
Lymphocytes Relative: 14 %
Lymphs Abs: 1.5 10*3/uL (ref 0.7–4.0)
MCH: 28.5 pg (ref 26.0–34.0)
MCHC: 31.6 g/dL (ref 30.0–36.0)
MCV: 90.2 fL (ref 80.0–100.0)
Monocytes Absolute: 0.8 10*3/uL (ref 0.1–1.0)
Monocytes Relative: 8 %
Neutro Abs: 7.6 10*3/uL (ref 1.7–7.7)
Neutrophils Relative %: 77 %
PLATELETS: 300 10*3/uL (ref 150–400)
RBC: 3.79 MIL/uL — ABNORMAL LOW (ref 4.22–5.81)
RDW: 14.4 % (ref 11.5–15.5)
WBC: 10.1 10*3/uL (ref 4.0–10.5)
nRBC: 0 % (ref 0.0–0.2)

## 2018-10-11 LAB — COMPREHENSIVE METABOLIC PANEL
ALT: 19 U/L (ref 0–44)
AST: 20 U/L (ref 15–41)
Albumin: 3.2 g/dL — ABNORMAL LOW (ref 3.5–5.0)
Alkaline Phosphatase: 58 U/L (ref 38–126)
Anion gap: 11 (ref 5–15)
BUN: 20 mg/dL (ref 8–23)
CHLORIDE: 101 mmol/L (ref 98–111)
CO2: 25 mmol/L (ref 22–32)
Calcium: 9.1 mg/dL (ref 8.9–10.3)
Creatinine, Ser: 1.49 mg/dL — ABNORMAL HIGH (ref 0.61–1.24)
GFR calc Af Amer: 50 mL/min — ABNORMAL LOW (ref >60)
GFR, EST NON AFRICAN AMERICAN: 43 mL/min — AB (ref >60)
Glucose, Bld: 126 mg/dL — ABNORMAL HIGH (ref 70–99)
Potassium: 3.2 mmol/L — ABNORMAL LOW (ref 3.5–5.1)
Sodium: 137 mmol/L (ref 135–145)
Total Bilirubin: 0.5 mg/dL (ref 0.3–1.2)
Total Protein: 7.4 g/dL (ref 6.5–8.1)

## 2018-10-11 LAB — TSH: TSH: 2.032 u[IU]/mL (ref 0.350–4.500)

## 2018-10-11 MED ORDER — CYANOCOBALAMIN 1000 MCG/ML IJ SOLN
1000.0000 ug | INTRAMUSCULAR | Status: DC
  Administered 2018-10-11: 1000 ug via INTRAMUSCULAR
  Filled 2018-10-11: qty 1

## 2018-10-11 MED ORDER — HEPARIN SOD (PORK) LOCK FLUSH 100 UNIT/ML IV SOLN: 500.0000 [IU] | Freq: Once | INTRAVENOUS | Status: DC | PRN

## 2018-10-11 MED ORDER — HEPARIN SOD (PORK) LOCK FLUSH 100 UNIT/ML IV SOLN
500.0000 [IU] | Freq: Once | INTRAVENOUS | Status: AC
  Administered 2018-10-11: 500 [IU] via INTRAVENOUS
  Filled 2018-10-11: qty 5

## 2018-10-11 MED ORDER — SODIUM CHLORIDE 0.9 % IV SOLN
1200.0000 mg | Freq: Once | INTRAVENOUS | Status: AC
  Administered 2018-10-11: 1200 mg via INTRAVENOUS
  Filled 2018-10-11: qty 20

## 2018-10-11 MED ORDER — SODIUM CHLORIDE 0.9% FLUSH
10.0000 mL | Freq: Once | INTRAVENOUS | Status: AC
  Administered 2018-10-11: 10 mL via INTRAVENOUS
  Filled 2018-10-11: qty 10

## 2018-10-11 MED ORDER — SODIUM CHLORIDE 0.9 % IV SOLN
510.0000 mg | Freq: Once | INTRAVENOUS | Status: AC
  Administered 2018-10-11: 510 mg via INTRAVENOUS
  Filled 2018-10-11: qty 17

## 2018-10-11 MED ORDER — SODIUM CHLORIDE 0.9 % IV SOLN
Freq: Once | INTRAVENOUS | Status: AC
  Administered 2018-10-11: 12:00:00 via INTRAVENOUS
  Filled 2018-10-11: qty 250

## 2018-10-11 MED ORDER — POTASSIUM CHLORIDE 10 MEQ/100ML IV SOLN: 10.0000 meq | INTRAVENOUS | Status: DC

## 2018-10-11 MED ORDER — SODIUM CHLORIDE 0.9 % IV SOLN
INTRAVENOUS | Status: AC
  Filled 2018-10-11: qty 250

## 2018-10-11 MED ORDER — SODIUM CHLORIDE 0.9 % IV SOLN
Freq: Once | INTRAVENOUS | Status: AC
  Administered 2018-10-11: 12:00:00 via INTRAVENOUS
  Filled 2018-10-11: qty 1000

## 2018-10-11 NOTE — Progress Notes (Signed)
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 For his prostate cancer he has received IM RT in the past. He has been seeing Dr. Erlene Quan in the past for his recurrent UTIs as well as hematuria. CT scan in July 2018 showed bladder wall thickening with perivascular edema and inflammation in the right ureter greater than the left. Findings were thought to be due to pyelonephritis at that time. He underwent cystoscopy on 12/14/2017 which showed abnormal looking prostate with necrotic material lining the entire surface area. Diffuse copious debris within the bladder appearing to be erythematous without discrete bladder tumor but visualization was poor. He was then admitted to the hospital on 12/26/2017 with symptoms of UTI and sepsis as well as new moderate bilateral hydronephrosis.  CT abdomen and pelvis with contrast on 12/20/2017 again showed market bile bladder wall thickening with perivesicular edema. Within the lumen of the bladder there is a 93.9 cm soft tissue attenuating filling defect. This is indeterminate and could represent an area of blood clot versus urothelial lesion.  He underwent repeat cystoscopy with bilateral pyelogram and ureteral stent placement as well as TURBT and TURP. He was found to have a massive tumor involving the majority of the bladder with grossly necrotic and calcified material. There appeared to  be multifocal disease with large burden of the left anterior bladder wall extending posteriorly as well as adjacent to the bladder neck and beyond the right hemitrigone. Very little normal recognizable bladder mucosa remaining.   Biopsy from TURBT and TURP showed: High-grade urothelial carcinoma with extensive necrosisinvolving both the bladder and the prostate.CT chest did not reveal any evidence of metastatic disease. He was also not found to have any regional adenopathy on CT abdomen  Patient was seen by Dr. Erlene Quan and was not deemed to be a surgical candidate. He has been referred to [place] for definitive treatment options.  Patient lives with his wife at home and ambulates with a cane. He does need assistance with his ADLs to some extent. He has not had any falls and he denies any changes in his appetite or unintentional weight loss. Denies any pain. Reports some fatigue and occasional problems with constipation. He has had 3 hospitalizations last year for urinary tract infections and currently has a chronic Foley for the last 1 month  Dr. Erlene Quan performed interval TURBT on 01/15/18 and was able to debulk tumor as much as possible to reduce tumor burden  Patient received 5 cycles ofcarboplatin/ gemcitabine 2 weeks on and 1 week off ending 04/26/18.Patient did receive radiation for 10 days during cycle 2 of treatment. Given patients age, co-morbidities and frailty- he was not a cisplatin candidate.6th cycle not given due to fall and hip fracture  Patient had a repeat cystoscopy in September 2019 which showed no obvious tumor bladder but it did have a shaggy necrotic appearance at the bladder neck. Prostatic fossa was grossly abnormal necrotic and irregular without papillary change. Bladder neck was biopsied and showed residual high-grade urothelial carcinoma.  Muscularis propria was not seen in that specimen.Due to evidence of recurrent/residual disease patient was started on  Tecentriq on 07/26/2018   Interval history-patient had his balloon of the catheter burst about 10 days ago.  He had a catheter placed in September 2019 which was supposed to be changed in 1 month's time but did not get that for some reason.  When the catheter burst patient went to the ER followed by a visit to urology following which his catheter was replaced.  During that time patient did not have a significant appetite and also reported some nausea and vomiting.  All the symptoms have resolved after the catheter has been changed  ECOG PS- 2 Pain scale- 0 Opioid associated constipation- no  Review of systems- Review of Systems  Constitutional: Positive for malaise/fatigue. Negative for chills, fever and weight loss.  HENT: Negative for congestion, ear discharge and nosebleeds.   Eyes: Negative for blurred vision.  Respiratory: Negative for cough, hemoptysis, sputum production, shortness of breath and wheezing.   Cardiovascular: Negative for chest pain, palpitations, orthopnea and claudication.  Gastrointestinal: Negative for abdominal pain, blood in stool, constipation, diarrhea, heartburn, melena, nausea and vomiting.  Genitourinary: Negative for dysuria, flank pain, frequency, hematuria and urgency.  Musculoskeletal: Negative for back pain, joint pain and myalgias.  Skin: Negative for rash.  Neurological: Negative for dizziness, tingling, focal weakness, seizures, weakness and headaches.  Endo/Heme/Allergies: Does not bruise/bleed easily.  Psychiatric/Behavioral: Negative for depression and suicidal ideas. The patient does not have insomnia.       Allergies  Allergen Reactions  . Penicillins Other (See Comments)    Caused nervousness as a child. Patient states he took as a teenager w/o problems Has patient had a PCN reaction causing immediate rash, facial/tongue/throat swelling, SOB or lightheadedness with hypotension: No Has patient had a PCN reaction causing severe rash involving  mucus membranes or skin necrosis: No Has patient had a PCN reaction that required hospitalization: No Has patient had a PCN reaction occurring within the last 10 years: No If all of the above answers are "NO", then may proceed with     Past Medical History:  Diagnosis Date  . Anxiety   . Arthritis   . Bladder cancer ([place])   . Dysrhythmia 07/2018   history of atrial flutter that worsens with anxiety  . Femur fracture, right ([place]) 05/01/2018  . GERD (gastroesophageal reflux disease)   . History of recent blood transfusion 05/2018  . Hypertension   . Iron deficiency anemia 09/14/2018  . Prostate cancer ([place]) 07/2018   cancer growing in prostate but not prostate cancer, it is from the bladder  . Umbilical hernia 40/0867  . Urinary retention 2019   foley catheter place 11/2017  . UTI (urinary tract infection) 2019   frequent UTI's over last year  . Wound eschar of foot 07/2018   left heal getting wrapped and requiring antibiotic cream. cracks open with weight bearing.     Past Surgical History:  Procedure Laterality Date  . CARPAL TUNNEL RELEASE Right   . CHOLECYSTECTOMY  2004  . CYSTOGRAM  07/18/2018   Procedure: CYSTOGRAM;  Surgeon: [name], MD;  Location: [facility];  Service: Urology;;  . Consuela Mimes W/ RETROGRADES Bilateral 07/18/2018   Procedure: CYSTOSCOPY WITH RETROGRADE PYELOGRAM;  Surgeon: [name], MD;  Location: [facility];  Service: Urology;  Laterality: Bilateral;  . CYSTOSCOPY W/ URETERAL STENT PLACEMENT Bilateral 12/27/2017   Procedure: CYSTOSCOPY WITH RETROGRADE PYELOGRAM/URETERAL STENT PLACEMENT;  Surgeon: [name], MD;  Location: [facility];  Service: Urology;  Laterality: Bilateral;  . CYSTOSCOPY W/ URETERAL STENT PLACEMENT Bilateral 07/18/2018   Procedure: CYSTOSCOPY WITH STENT REPLACEMENT (exchange);  Surgeon: [name], MD;  Location: [facility];  Service: Urology;  Laterality: Bilateral;  . INTRAMEDULLARY (IM) NAIL INTERTROCHANTERIC Right  05/02/2018   Procedure: INTRAMEDULLARY (IM) NAIL INTERTROCHANTRIC;  Surgeon: [name], MD;  Location: [facility];  Service: Orthopedics;  Laterality: Right;  . LEG TENDON SURGERY Right 1958  . PORTA CATH INSERTION N/A 01/22/2018   Procedure: PORTA CATH INSERTION;  Surgeon: [name], MD;  Location: [facility];  Service: Cardiovascular;  Laterality: N/A;  . TRANSURETHRAL RESECTION OF BLADDER TUMOR N/A 12/27/2017   Procedure: TRANSURETHRAL RESECTION OF BLADDER TUMOR (TURBT);  Surgeon: [name], MD;  Location: [facility];  Service: Urology;  Laterality: N/A;  . TRANSURETHRAL RESECTION OF BLADDER TUMOR N/A 01/15/2018   Procedure: TRANSURETHRAL RESECTION OF BLADDER TUMOR (TURBT);  Surgeon: [name], MD;  Location: [facility];  Service: Urology;  Laterality: N/A;  Need 2 hrs for this case please  . TRANSURETHRAL RESECTION OF BLADDER TUMOR N/A 07/18/2018   Procedure: TRANSURETHRAL RESECTION OF BLADDER TUMOR (TURBT);  Surgeon: [name], MD;  Location: [facility];  Service: Urology;  Laterality: N/A;    Social History   Socioeconomic History  . Marital status: Married    Spouse name: ruth  . Number of children: Not on file  . Years of education: Not on file  . Highest education level: Not on file  Occupational History  . Occupation: retired    Comment: Therapist, [employer]  Social Needs  . Financial resource strain: Not on file  . Food insecurity:    Worry: Not on file    Inability: Not on file  . Transportation needs:    Medical: Not on file    Non-medical: Not on file  Tobacco Use  . Smoking status: Never Smoker  . Smokeless tobacco: Never Used  Substance and Sexual Activity  . Alcohol use: No    Alcohol/week: 0.0 standard drinks  . Drug use: No  . Sexual activity: Not Currently  Lifestyle  . Physical activity:    Days per week: Not on file    Minutes per session: Not on file  . Stress: Not on file  Relationships  . Social connections:    Talks on phone:  Not on file    Gets together: Not on file    Attends religious service: Not on file    Active member of club or organization: Not on file    Attends meetings of clubs or organizations: Not on file    Relationship status: Not on file  . Intimate partner violence:    Fear of current or ex partner: Not on file    Emotionally abused: Not on file    Physically abused: Not on file    Forced sexual activity: Not on file  Other Topics Concern  . Not on file  Social History Narrative  . Not on file    Family History  Problem Relation Age of Onset  . Cancer Mother   . Chronic Renal Failure Mother   . Heart disease Father      Current Outpatient Medications:  .  acetaminophen (TYLENOL) 500 MG tablet, Take 1,000 mg by mouth every 6 (six) hours as needed (for pain.)., Disp: , Rfl:  .  cetirizine (ZYRTEC) 10 MG tablet, Take 10 mg by mouth daily., Disp: , Rfl:  .  CRANBERRY PO, Take 750 mg by mouth 2 (two) times daily. , Disp: , Rfl:  .  ferrous sulfate 325 (65 FE) MG tablet, Take 1 tablet (325 mg total) by mouth 2 (two) times daily with a meal., Disp: , Rfl: 3 .  Multiple Vitamin (MULTIVITAMIN WITH MINERALS) TABS tablet, Take 1 tablet by mouth daily., Disp: , Rfl:  .  MYRBETRIQ 50 MG TB24 tablet, TAKE 1 TABLET DAILY, Disp: 30 tablet, Rfl: 3 .  nystatin (NYAMYC) powder, Apply topically 2 (two) times daily., Disp: 60 g, Rfl: 3 .  vitamin B-12 1000 MCG tablet, Take 1 tablet (1,000 mcg total) by mouth daily., Disp: , Rfl:  .  docusate sodium (COLACE) 100 MG capsule, Take 1 capsule (100 mg total) by mouth 2 (two) times daily. (Patient not taking: Reported on 10/11/2018), Disp: 10 capsule, Rfl: 0 .  metoCLOPramide (REGLAN) 5 MG tablet, Take 1-2 tablets (5-10 mg total) by mouth every 8 (eight) hours as needed for nausea (if ondansetron (ZOFRAN) ineffective.). (Patient not taking: Reported on 10/11/2018), Disp: , Rfl:  .  neomycin-bacitracin-polymyxin (NEOSPORIN) ointment, Apply topically., Disp: , Rfl:    .  ondansetron (ZOFRAN) 4 MG tablet, Take 1 tablet (4 mg total) by mouth every 6 (six) hours as needed for nausea. (Patient not taking: Reported on 10/11/2018), Disp: 20 tablet, Rfl: 0 .  oxyCODONE (OXY IR/ROXICODONE) 5 MG immediate release tablet, Take 1-2 tablets (5-10 mg total) by mouth every 4 (four) hours as needed for moderate pain (pain score 4-6). (Patient not taking: Reported on 10/11/2018), Disp: 30 tablet, Rfl: 0 .  pantoprazole (PROTONIX) 40 MG tablet, Take 1 tablet (40 mg total) by mouth daily. (Patient not taking: Reported on 10/11/2018), Disp: , Rfl:  .  polyethylene glycol powder (MIRALAX) powder, Take 17 g by mouth daily as needed. Can increase to 3 times a day as needed for constipation but hold medication if has diarrhea (Patient not taking: Reported on 10/11/2018), Disp: 255 g, Rfl: 0  Current Facility-Administered Medications:  .  0.9 %  sodium chloride infusion, , Intravenous, Continuous, Sindy Guadeloupe, MD  Facility-Administered Medications Ordered in Other Visits:  .  atezolizumab (TECENTRIQ) 1,200 mg in sodium chloride 0.9 % 250 mL chemo infusion, 1,200 mg, Intravenous, Once, Sindy Guadeloupe, MD .  cyanocobalamin ((VITAMIN B-12)) injection 1,000 mcg, 1,000 mcg, Intramuscular, Q30 days, Sindy Guadeloupe, MD, 1,000 mcg at 10/11/18 1156 .  ferumoxytol (FERAHEME) 510 mg in sodium chloride 0.9 % 100 mL IVPB, 510 mg, Intravenous, Once, Sindy Guadeloupe, MD .  heparin lock flush 100 unit/mL, 500 Units, Intravenous, Once, Sindy Guadeloupe, MD .  heparin lock flush 100 unit/mL, 500 Units, Intracatheter, Once PRN, Sindy Guadeloupe, MD .  sodium chloride 0.9 % 1,000 mL with potassium chloride 20 mEq infusion, , Intravenous, Once, Sindy Guadeloupe, MD, Last Rate: 999 mL/hr at 10/11/18 1155  Physical exam:  Vitals:   10/11/18 1056  BP: 113/72  Pulse: (!) 103  Resp: 18  Temp: (!) 95.8 F (35.4 C)  TempSrc: Tympanic  Weight: 168 lb (76.2 kg)   Physical Exam  Constitutional: He is oriented to  person, place, and time.  Frail elderly gentleman sitting in a wheelchair.  Appears in no acute distress  HENT:  Head: Normocephalic and atraumatic.  Eyes: Pupils are equal, round, and reactive to light. EOM are normal.  Neck: Normal range of motion.  Cardiovascular: Normal rate, regular rhythm and normal heart sounds.  Pulmonary/Chest: Effort normal and breath  sounds normal.  Abdominal: Soft. Bowel sounds are normal.  Foley is draining clear urine  Neurological: He is alert and oriented to person, place, and time.  Skin: Skin is warm and dry.     CMP Latest Ref Rng & Units 10/11/2018  Glucose 70 - 99 mg/dL 126(H)  BUN 8 - 23 mg/dL 20  Creatinine 0.61 - 1.24 mg/dL 1.49(H)  Sodium 135 - 145 mmol/L 137  Potassium 3.5 - 5.1 mmol/L 3.2(L)  Chloride 98 - 111 mmol/L 101  CO2 22 - 32 mmol/L 25  Calcium 8.9 - 10.3 mg/dL 9.1  Total Protein 6.5 - 8.1 g/dL 7.4  Total Bilirubin 0.3 - 1.2 mg/dL 0.5  Alkaline Phos 38 - 126 U/L 58  AST 15 - 41 U/L 20  ALT 0 - 44 U/L 19   CBC Latest Ref Rng & Units 10/11/2018  WBC 4.0 - 10.5 K/uL 10.1  Hemoglobin 13.0 - 17.0 g/dL 10.8(L)  Hematocrit 39.0 - 52.0 % 34.2(L)  Platelets 150 - 400 K/uL 300    No images are attached to the encounter.  No results found.   Assessment and plan- Patient is a 83 y.o. 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   He is here for on treatment assessment prior to cycle 4 of Tecentriq  1.  Counts okay to proceed with cycle 4 of Tecentriq today.  I will see him back in 3 weeks time with CBC and CMP prior to cycle 5.  2.  He does have some moderate anemia and iron studies checked during his last visit did show some evidence of iron deficiency.  He will proceed with first dose of Feraheme today.  And he will get his second dose with his next cycle of Tecentriq.  3.  Hypokalemia: He will get 20  mEq of IV potassium today.  4.  AKI: Patient's creatinine is elevated at 1.49 today from his baseline of 1.1.  This is likely secondary to his poor intake and nausea and vomiting which she had due to his catheter issues a week ago.  I will proceed with 1 L of IV fluids today and repeat his BMP in 1 week's time.  If there is no significant improvement in his creatinine I may have to push his CT scans by 1 month as I do not want him to get further contrast-induced nephropathy   Visit Diagnosis 1. Hypokalemia   2. AKI (acute kidney injury) ([place])   3. Encounter for antineoplastic immunotherapy   4. Malignant neoplasm of overlapping sites of bladder [facility])      Dr. Randa Evens, MD, MPH [facility] [phone] 10/11/2018 12:07 PM

## 2018-10-11 NOTE — Progress Notes (Signed)
Per Judeen Hammans RN per Dr. [name], labs and VS reviewed (creatinine, potassium, HR) Pt to receive scheduled Tecentriq, B12, Feraheme and add 20 MEQs of potassium in 1 liter of IVFs.

## 2018-10-11 NOTE — Progress Notes (Unsigned)
ttsh

## 2018-10-11 NOTE — Addendum Note (Signed)
Addended by: Waymon Budge on: 10/11/2018 02:51 PM   Modules accepted: Orders

## 2018-10-11 NOTE — Progress Notes (Signed)
Here for follow up. Per wife catheter had been in for 3 mo ( since 07/18/18 ) and balloon burst last Sunday 10/07/18 -pt taken to ER -and they did not replace the catheter. They went to Dr Erlene Quan office on Monday 10/09/18 and reinserted without issues.  Pt no longer nauseous and sitting in w/c catheter intact / leg bag.only complaintt is penis / catheter entry point discomfort today.

## 2018-10-18 ENCOUNTER — Ambulatory Visit: Payer: Medicare Other

## 2018-10-18 ENCOUNTER — Other Ambulatory Visit: Payer: Medicare Other

## 2018-10-19 ENCOUNTER — Inpatient Hospital Stay: Payer: Medicare Other

## 2018-10-19 DIAGNOSIS — C678 Malignant neoplasm of overlapping sites of bladder: Secondary | ICD-10-CM | POA: Diagnosis not present

## 2018-10-19 DIAGNOSIS — E876 Hypokalemia: Secondary | ICD-10-CM

## 2018-10-19 DIAGNOSIS — C679 Malignant neoplasm of bladder, unspecified: Secondary | ICD-10-CM

## 2018-10-19 DIAGNOSIS — N179 Acute kidney failure, unspecified: Secondary | ICD-10-CM

## 2018-10-19 LAB — CBC WITH DIFFERENTIAL/PLATELET
ABS IMMATURE GRANULOCYTES: 0.1 10*3/uL — AB (ref 0.00–0.07)
Basophils Absolute: 0.1 10*3/uL (ref 0.0–0.1)
Basophils Relative: 1 %
Eosinophils Absolute: 0.1 10*3/uL (ref 0.0–0.5)
Eosinophils Relative: 1 %
HCT: 33.2 % — ABNORMAL LOW (ref 39.0–52.0)
HEMOGLOBIN: 10.4 g/dL — AB (ref 13.0–17.0)
IMMATURE GRANULOCYTES: 1 %
Lymphocytes Relative: 13 %
Lymphs Abs: 1.4 10*3/uL (ref 0.7–4.0)
MCH: 28.7 pg (ref 26.0–34.0)
MCHC: 31.3 g/dL (ref 30.0–36.0)
MCV: 91.5 fL (ref 80.0–100.0)
Monocytes Absolute: 0.6 10*3/uL (ref 0.1–1.0)
Monocytes Relative: 6 %
NEUTROS PCT: 78 %
Neutro Abs: 8.7 10*3/uL — ABNORMAL HIGH (ref 1.7–7.7)
Platelets: 321 10*3/uL (ref 150–400)
RBC: 3.63 MIL/uL — ABNORMAL LOW (ref 4.22–5.81)
RDW: 15.2 % (ref 11.5–15.5)
WBC: 11 10*3/uL — ABNORMAL HIGH (ref 4.0–10.5)
nRBC: 0 % (ref 0.0–0.2)

## 2018-10-19 LAB — BASIC METABOLIC PANEL
ANION GAP: 10 (ref 5–15)
BUN: 18 mg/dL (ref 8–23)
CO2: 25 mmol/L (ref 22–32)
Calcium: 9 mg/dL (ref 8.9–10.3)
Chloride: 106 mmol/L (ref 98–111)
Creatinine, Ser: 1.25 mg/dL — ABNORMAL HIGH (ref 0.61–1.24)
GFR calc Af Amer: >60 mL/min (ref >60)
GFR calc non Af Amer: 54 mL/min — ABNORMAL LOW (ref >60)
Glucose, Bld: 121 mg/dL — ABNORMAL HIGH (ref 70–99)
Potassium: 3.2 mmol/L — ABNORMAL LOW (ref 3.5–5.1)
Sodium: 141 mmol/L (ref 135–145)

## 2018-10-19 MED ORDER — HEPARIN SOD (PORK) LOCK FLUSH 100 UNIT/ML IV SOLN
500.0000 [IU] | Freq: Once | INTRAVENOUS | Status: AC
  Administered 2018-10-19: 500 [IU] via INTRAVENOUS
  Filled 2018-10-19: qty 5

## 2018-10-19 MED ORDER — SODIUM CHLORIDE 0.9% FLUSH
10.0000 mL | INTRAVENOUS | Status: DC | PRN
  Administered 2018-10-19: 10 mL via INTRAVENOUS
  Filled 2018-10-19: qty 10

## 2018-10-19 MED ORDER — SODIUM CHLORIDE 0.9 % IV SOLN
INTRAVENOUS | Status: DC
  Administered 2018-10-19: 09:00:00 via INTRAVENOUS
  Filled 2018-10-19 (×2): qty 1000

## 2018-10-22 ENCOUNTER — Ambulatory Visit
Admission: RE | Admit: 2018-10-22 | Discharge: 2018-10-22 | Disposition: A | Discharge status: Home / Self Care | Payer: Medicare Other | Source: Ambulatory Visit | Attending: Oncology | Admitting: Oncology

## 2018-10-22 DIAGNOSIS — R918 Other nonspecific abnormal finding of lung field: Secondary | ICD-10-CM | POA: Insufficient documentation

## 2018-10-22 DIAGNOSIS — I2510 Atherosclerotic heart disease of native coronary artery without angina pectoris: Secondary | ICD-10-CM | POA: Insufficient documentation

## 2018-10-22 DIAGNOSIS — K861 Other chronic pancreatitis: Secondary | ICD-10-CM | POA: Diagnosis not present

## 2018-10-22 DIAGNOSIS — I700 Atherosclerosis of aorta: Secondary | ICD-10-CM | POA: Diagnosis not present

## 2018-10-22 DIAGNOSIS — C689 Malignant neoplasm of urinary organ, unspecified: Secondary | ICD-10-CM

## 2018-10-22 DIAGNOSIS — N1330 Unspecified hydronephrosis: Secondary | ICD-10-CM | POA: Diagnosis not present

## 2018-10-22 DIAGNOSIS — N329 Bladder disorder, unspecified: Secondary | ICD-10-CM | POA: Diagnosis not present

## 2018-10-22 MED ORDER — IOPAMIDOL (ISOVUE-300) INJECTION 61%
100.0000 mL | Freq: Once | INTRAVENOUS | Status: AC | PRN
  Administered 2018-10-22: 100 mL via INTRAVENOUS

## 2018-10-23 ENCOUNTER — Other Ambulatory Visit: Payer: Self-pay | Admitting: Radiology

## 2018-10-23 ENCOUNTER — Encounter: Payer: Self-pay | Admitting: Urology

## 2018-10-23 ENCOUNTER — Ambulatory Visit (INDEPENDENT_AMBULATORY_CARE_PROVIDER_SITE_OTHER): Payer: Medicare Other | Admitting: Urology

## 2018-10-23 VITALS — BP 125/75 | HR 108 | Ht 71.0 in | Wt 168.0 lb

## 2018-10-23 DIAGNOSIS — C678 Malignant neoplasm of overlapping sites of bladder: Secondary | ICD-10-CM

## 2018-10-23 DIAGNOSIS — N135 Crossing vessel and stricture of ureter without hydronephrosis: Secondary | ICD-10-CM

## 2018-10-23 DIAGNOSIS — R339 Retention of urine, unspecified: Secondary | ICD-10-CM | POA: Diagnosis not present

## 2018-10-23 DIAGNOSIS — N390 Urinary tract infection, site not specified: Secondary | ICD-10-CM

## 2018-10-23 LAB — URINALYSIS, COMPLETE
Bilirubin, UA: NEGATIVE
Glucose, UA: NEGATIVE
Ketones, UA: NEGATIVE
Nitrite, UA: POSITIVE — AB
Specific Gravity, UA: 1.02 (ref 1.005–1.030)
Urobilinogen, Ur: 0.2 mg/dL (ref 0.2–1.0)
pH, UA: 5.5 (ref 5.0–7.5)

## 2018-10-23 LAB — MICROSCOPIC EXAMINATION
Epithelial Cells (non renal): NONE SEEN /hpf (ref 0–10)
RBC, UA: >30 /hpf — ABNORMAL HIGH (ref 0–2)
WBC, UA: >30 /hpf — ABNORMAL HIGH (ref 0–5)

## 2018-10-23 NOTE — H&P (View-Only) (Signed)
10/23/2018 11:32 AM   [name] 1935/09/27 [id number]  Referring provider: [name], MD [facility] [place], [place] [address]  Chief Complaint  Patient presents with  . Bladder Cancer    3 mo stent exchange    HPI: 83 year old male who presents today to discuss ureteral stent exchange.  His last exchange was 07/18/2018 which showed a fair amount of encrustation.  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  He was initially diagnosed on 12/27/2017 at which time indwelling ureteral stents were placed for bilateral ureteral obstruction.  He underwent TURBT on 01/15/18 for the purpose of debulking.  He received 5 cycles ofcarboplatin/ gemcitabine 2 weeks on and 1 week off ending 04/26/18.Patient did receive radiation for 10 days during cycle 2 of treatment. Given patients age, co-morbidities and frailty- he was not a cisplatin candidate.  Most recent imaging performed just yesterday shows increasing left hydronephrosis with a stent in adequate position.  Right-sided hydronephrosis is improved.  There is diffuse bladder wall thickening.  There is no evidence of lymphadenopathy or metastatic disease.  He continues have a chronic indwelling Foley.    He is had issues with this catheter "falling out" and replaced our office.    Since last visit, he seems to be improving.  He is now back at home and regaining of strength after a fall requiring rehabilitation.  Followed by Dr. [name].  He is currently on eccentric started on 07/2018 which has been well-tolerated.   PMH: Past Medical History:  Diagnosis Date  . Anxiety   . Arthritis   . Bladder cancer ([place])   . Dysrhythmia 07/2018   history of atrial flutter that worsens with anxiety  . Femur fracture, right ([place]) 05/01/2018  . GERD (gastroesophageal reflux disease)   . History of recent blood transfusion 05/2018    . Hypertension   . Iron deficiency anemia 09/14/2018  . Prostate cancer ([place]) 07/2018   cancer growing in prostate but not prostate cancer, it is from the bladder  . Umbilical hernia [id number]/6568  . Urinary retention 2019   foley catheter place 11/2017  . UTI (urinary tract infection) 2019   frequent UTI's over last year  . Wound eschar of foot 07/2018   left heal getting wrapped and requiring antibiotic cream. cracks open with weight bearing.    Surgical History: Past Surgical History:  Procedure Laterality Date  . CARPAL TUNNEL RELEASE Right   . CHOLECYSTECTOMY  2004  . CYSTOGRAM  07/18/2018   Procedure: CYSTOGRAM;  Surgeon: [name], MD;  Location: [facility];  Service: Urology;;  . Consuela Mimes W/ RETROGRADES Bilateral 07/18/2018   Procedure: CYSTOSCOPY WITH RETROGRADE PYELOGRAM;  Surgeon: [name], MD;  Location: [facility];  Service: Urology;  Laterality: Bilateral;  . CYSTOSCOPY W/ URETERAL STENT PLACEMENT Bilateral 12/27/2017   Procedure: CYSTOSCOPY WITH RETROGRADE PYELOGRAM/URETERAL STENT PLACEMENT;  Surgeon: [name], MD;  Location: [facility];  Service: Urology;  Laterality: Bilateral;  . CYSTOSCOPY W/ URETERAL STENT PLACEMENT Bilateral 07/18/2018   Procedure: CYSTOSCOPY WITH STENT REPLACEMENT (exchange);  Surgeon: [name], MD;  Location: [facility];  Service: Urology;  Laterality: Bilateral;  . INTRAMEDULLARY (IM) NAIL INTERTROCHANTERIC Right 05/02/2018   Procedure: INTRAMEDULLARY (IM) NAIL INTERTROCHANTRIC;  Surgeon: [name], MD;  Location: [facility];  Service: Orthopedics;  Laterality: Right;  . LEG TENDON SURGERY Right 1958  . PORTA CATH INSERTION N/A 01/22/2018   Procedure:  PORTA CATH INSERTION;  Surgeon: [name], MD;  Location: [facility];  Service: Cardiovascular;  Laterality: N/A;  . TRANSURETHRAL RESECTION OF BLADDER TUMOR N/A 12/27/2017   Procedure: TRANSURETHRAL RESECTION OF BLADDER TUMOR (TURBT);  Surgeon: [name], MD;   Location: [facility];  Service: Urology;  Laterality: N/A;  . TRANSURETHRAL RESECTION OF BLADDER TUMOR N/A 01/15/2018   Procedure: TRANSURETHRAL RESECTION OF BLADDER TUMOR (TURBT);  Surgeon: [name], MD;  Location: [facility];  Service: Urology;  Laterality: N/A;  Need 2 hrs for this case please  . TRANSURETHRAL RESECTION OF BLADDER TUMOR N/A 07/18/2018   Procedure: TRANSURETHRAL RESECTION OF BLADDER TUMOR (TURBT);  Surgeon: [name], MD;  Location: [facility];  Service: Urology;  Laterality: N/A;    Home Medications:  Allergies as of 10/23/2018      Reactions   Penicillins Other (See Comments)   Caused nervousness as a child. Patient states he took as a teenager w/o problems Has patient had a PCN reaction causing immediate rash, facial/tongue/throat swelling, SOB or lightheadedness with hypotension: No Has patient had a PCN reaction causing severe rash involving mucus membranes or skin necrosis: No Has patient had a PCN reaction that required hospitalization: No Has patient had a PCN reaction occurring within the last 10 years: No If all of the above answers are "NO", then may proceed with      Medication List       Accurate as of October 23, 2018 11:59 PM. Always use your most recent med list.        acetaminophen 500 MG tablet Commonly known as:  TYLENOL Take 1,000 mg by mouth every 6 (six) hours as needed for moderate pain or headache.   cetirizine 10 MG tablet Commonly known as:  ZYRTEC Take 10 mg by mouth daily as needed for allergies.   CRANBERRY PO Take 1 capsule by mouth 2 (two) times daily.   vitamin B-12 500 MCG tablet Commonly known as:  CYANOCOBALAMIN Take 500 mcg by mouth daily.   cyanocobalamin 1000 MCG tablet Take 1 tablet (1,000 mcg total) by mouth daily.   docusate sodium 100 MG capsule Commonly known as:  COLACE Take 1 capsule (100 mg total) by mouth 2 (two) times daily.   ferrous sulfate 325 (65 FE) MG tablet Take 1 tablet (325 mg total) by  mouth 2 (two) times daily with a meal.   multivitamin with minerals Tabs tablet Take 1 tablet by mouth daily.   MYRBETRIQ 50 MG Tb24 tablet Generic drug:  mirabegron ER TAKE 1 TABLET DAILY   neomycin-bacitracin-polymyxin ointment Commonly known as:  NEOSPORIN Apply topically.   nystatin powder Commonly known as:  NYAMYC Apply topically 2 (two) times daily.   ondansetron 4 MG tablet Commonly known as:  ZOFRAN Take 1 tablet (4 mg total) by mouth every 6 (six) hours as needed for nausea.   pantoprazole 40 MG tablet Commonly known as:  PROTONIX Take 1 tablet (40 mg total) by mouth daily.   polyethylene glycol powder powder Commonly known as:  MIRALAX Take 17 g by mouth daily as needed. Can increase to 3 times a day as needed for constipation but hold medication if has diarrhea   zinc oxide 20 % ointment Apply 1 application topically as needed for irritation.       Allergies:  Allergies  Allergen Reactions  . Penicillins Other (See Comments)    Caused nervousness as a child. Patient states he took as a teenager w/o problems Has  patient had a PCN reaction causing immediate rash, facial/tongue/throat swelling, SOB or lightheadedness with hypotension: No Has patient had a PCN reaction causing severe rash involving mucus membranes or skin necrosis: No Has patient had a PCN reaction that required hospitalization: No Has patient had a PCN reaction occurring within the last 10 years: No If all of the above answers are "NO", then may proceed with    Family History: Family History  Problem Relation Age of Onset  . Cancer Mother   . Chronic Renal Failure Mother   . Heart disease Father     Social History:  reports that he has never smoked. He has never used smokeless tobacco. He reports that he does not drink alcohol or use drugs.  ROS: UROLOGY Frequent Urination?: No Hard to postpone urination?: No Burning/pain with urination?: No Get up at night to urinate?:  No Leakage of urine?: Yes Urine stream starts and stops?: No Trouble starting stream?: No Do you have to strain to urinate?: No Blood in urine?: No Urinary tract infection?: No Sexually transmitted disease?: No Injury to kidneys or bladder?: No Painful intercourse?: No Weak stream?: No Erection problems?: No Penile pain?: No  Gastrointestinal Nausea?: No Vomiting?: No Indigestion/heartburn?: No Diarrhea?: No Constipation?: No  Constitutional Fever: No Night sweats?: No Weight loss?: No Fatigue?: No  Skin Skin rash/lesions?: No Itching?: No  Eyes Blurred vision?: No Double vision?: No  Ears/Nose/Throat Sore throat?: No Sinus problems?: No  Hematologic/Lymphatic Swollen glands?: No Easy bruising?: No  Cardiovascular Leg swelling?: No Chest pain?: No  Respiratory Cough?: No Shortness of breath?: No  Endocrine Excessive thirst?: No  Musculoskeletal Back pain?: No Joint pain?: No  Neurological Headaches?: No Dizziness?: No  Psychologic Depression?: No Anxiety?: No  Physical Exam: BP 125/75   Pulse (!) 108   Ht 5\' 11"  (1.803 m)   Wt 168 lb (76.2 kg)   BMI 23.43 kg/m   Constitutional:  Alert and oriented, No acute distress.  Accompanied by wife today.  Appears to be improved since last visit, in wheelchair. HEENT: [place] AT, moist mucus membranes.  Trachea midline, no masses. Cardiovascular: No clubbing, cyanosis, or edema. Respiratory: Normal respiratory effort, no increased work of breathing. GI: Abdomen is soft, nontender, nondistended, no abdominal masses GU: Fully catheter in place draining cloudy urine. Skin: No rashes, bruises or suspicious lesions. Neurologic: Grossly intact, no focal deficits, moving all 4 extremities. Psychiatric: Normal mood and affect.  Laboratory Data: Lab Results  Component Value Date   WBC 13.0 (H) 11/02/2018   HGB 12.1 (L) 11/02/2018   HCT 38.7 (L) 11/02/2018   MCV 91.9 11/02/2018   PLT 284 11/02/2018     Lab Results  Component Value Date   CREATININE 1.49 (H) 11/02/2018    Lab Results  Component Value Date   PSA 0.02 12/22/2016   PSA 0.05 12/24/2015   PSA 0.05 05/27/2015   Urinalysis    Component Value Date/Time   COLORURINE AMBER (A) 05/01/2018 1901   APPEARANCEUR Cloudy (A) 10/23/2018 1327   LABSPEC 1.015 05/01/2018 1901   PHURINE 6.0 05/01/2018 1901   GLUCOSEU Negative 10/23/2018 1327   HGBUR SMALL (A) 05/01/2018 1901   BILIRUBINUR Negative 10/23/2018 1327   KETONESUR NEGATIVE 05/01/2018 1901   PROTEINUR 3+ (A) 10/23/2018 1327   PROTEINUR 100 (A) 05/01/2018 1901   NITRITE Positive (A) 10/23/2018 1327   NITRITE NEGATIVE 05/01/2018 1901   LEUKOCYTESUR 3+ (A) 10/23/2018 1327    Lab Results  Component Value Date   LABMICR  See below: 10/23/2018   WBCUA >30 (H) 10/23/2018   RBCUA >30 (H) 10/23/2018   LABEPIT None seen 10/23/2018   MUCUS Present (A) 10/23/2018   BACTERIA Many (A) 10/23/2018    Pertinent Imaging: CT chest abdomen pelvis from 10/22/2018 was personally reviewed today.  Agree with radiologic interpretation.  IMPRESSION: 1. Increasing left hydronephrosis with a double-J left ureteral stent in place. Right-sided hydronephrosis has improved significantly in the interval. Diffuse bladder wall thickening. 2. No evidence of metastatic disease. 3. 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 4. Asbestos related pleural disease. 5. Aortic atherosclerosis (ICD10-170.0). Coronary artery calcification.   Assessment & Plan:    1. Malignant neoplasm of overlapping sites of bladder ([place]) Status post IMRT/abbreviated course of chemo Currently on tech centric No evidence of metastatic disease Locally advanced - Urinalysis, Complete - CULTURE, URINE COMPREHENSIVE  2. Bilateral ureteral obstruction Managed with bilateral ureteral indwelling stents Given the amount of encrustation and stent obstruction the time of last  exchange, recommend to sooner stent exchange on this occasion CT scan reviewed, there is evidence of persistent and increasing left-sided hydronephrosis which is likely secondary to stent obstruction, improved on right We will plan for stent exchange Reviewed risk and benefits in detail including risk of bleeding, infection, damage surrounding structures, sepsis, anesthetic complications  3. Recurrent UTI Chronically colonized We will ensure site-directed antibiotics at the time of stent exchange  4. Urinary retention Small capacity bladder Maintain Foley catheter    [name], MD  [facility] [address] [place], [place] [address] [phone]  I spent 25 min with this patient of which greater than 50% was spent in counseling and coordination of care with the patient.

## 2018-10-23 NOTE — Progress Notes (Signed)
10/23/2018 11:32 AM   Vincent Poole [birth year] [id number]  Referring provider: [name], MD [facility] [place], [place] [address]  Chief Complaint  Patient presents with  . Bladder Cancer    3 mo stent exchange    HPI: 83 year old male who presents today to discuss ureteral stent exchange.  His last exchange was 07/18/2018 which showed a fair amount of encrustation.  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  He was initially diagnosed on 12/27/2017 at which time indwelling ureteral stents were placed for bilateral ureteral obstruction.  He underwent TURBT on 01/15/18 for the purpose of debulking.  He received 5 cycles ofcarboplatin/ gemcitabine 2 weeks on and 1 week off ending 04/26/18.Patient did receive radiation for 10 days during cycle 2 of treatment. Given patients age, co-morbidities and frailty- he was not a cisplatin candidate.  Most recent imaging performed just yesterday shows increasing left hydronephrosis with a stent in adequate position.  Right-sided hydronephrosis is improved.  There is diffuse bladder wall thickening.  There is no evidence of lymphadenopathy or metastatic disease.  He continues have a chronic indwelling Foley.    He is had issues with this catheter "falling out" and replaced our office.    Since last visit, he seems to be improving.  He is now back at home and regaining of strength after a fall requiring rehabilitation.  Followed by Dr. [name].  He is currently on eccentric started on 07/2018 which has been well-tolerated.   PMH: Past Medical History:  Diagnosis Date  . Anxiety   . Arthritis   . Bladder cancer (Vincent Poole)   . Dysrhythmia 07/2018   history of atrial flutter that worsens with anxiety  . Femur fracture, right ([place]) 05/01/2018  . GERD (gastroesophageal reflux disease)   . History of recent blood transfusion 05/2018    . Hypertension   . Iron deficiency anemia 09/14/2018  . Prostate cancer (Vincent Poole) 07/2018   cancer growing in prostate but not prostate cancer, it is from the bladder  . Umbilical hernia [id number]/4696  . Urinary retention 2019   foley catheter place 11/2017  . UTI (urinary tract infection) 2019   frequent UTI's over last year  . Wound eschar of foot 07/2018   left heal getting wrapped and requiring antibiotic cream. cracks open with weight bearing.    Surgical History: Past Surgical History:  Procedure Laterality Date  . CARPAL TUNNEL RELEASE Right   . CHOLECYSTECTOMY  2004  . CYSTOGRAM  07/18/2018   Procedure: CYSTOGRAM;  Surgeon: [name], MD;  Location: [facility];  Service: Urology;;  . Consuela Mimes W/ RETROGRADES Bilateral 07/18/2018   Procedure: CYSTOSCOPY WITH RETROGRADE PYELOGRAM;  Surgeon: [name], MD;  Location: [facility];  Service: Urology;  Laterality: Bilateral;  . CYSTOSCOPY W/ URETERAL STENT PLACEMENT Bilateral 12/27/2017   Procedure: CYSTOSCOPY WITH RETROGRADE PYELOGRAM/URETERAL STENT PLACEMENT;  Surgeon: [name], MD;  Location: [facility];  Service: Urology;  Laterality: Bilateral;  . CYSTOSCOPY W/ URETERAL STENT PLACEMENT Bilateral 07/18/2018   Procedure: CYSTOSCOPY WITH STENT REPLACEMENT (exchange);  Surgeon: [name], MD;  Location: [facility];  Service: Urology;  Laterality: Bilateral;  . INTRAMEDULLARY (IM) NAIL INTERTROCHANTERIC Right 05/02/2018   Procedure: INTRAMEDULLARY (IM) NAIL INTERTROCHANTRIC;  Surgeon: [name], MD;  Location: [facility];  Service: Orthopedics;  Laterality: Right;  . LEG TENDON SURGERY Right 1958  . PORTA CATH INSERTION N/A 01/22/2018   Procedure:  PORTA CATH INSERTION;  Surgeon: [name], MD;  Location: [place] [facility];  Service: Cardiovascular;  Laterality: N/A;  . TRANSURETHRAL RESECTION OF BLADDER TUMOR N/A 12/27/2017   Procedure: TRANSURETHRAL RESECTION OF BLADDER TUMOR (TURBT);  Surgeon: [name], MD;   Location: [facility];  Service: Urology;  Laterality: N/A;  . TRANSURETHRAL RESECTION OF BLADDER TUMOR N/A 01/15/2018   Procedure: TRANSURETHRAL RESECTION OF BLADDER TUMOR (TURBT);  Surgeon: [name], MD;  Location: [facility];  Service: Urology;  Laterality: N/A;  Need 2 hrs for this case please  . TRANSURETHRAL RESECTION OF BLADDER TUMOR N/A 07/18/2018   Procedure: TRANSURETHRAL RESECTION OF BLADDER TUMOR (TURBT);  Surgeon: [name], MD;  Location: [facility];  Service: Urology;  Laterality: N/A;    Home Medications:  Allergies as of 10/23/2018      Reactions   Penicillins Other (See Comments)   Caused nervousness as a child. Patient states he took as a teenager w/o problems Has patient had a PCN reaction causing immediate rash, facial/tongue/throat swelling, SOB or lightheadedness with hypotension: No Has patient had a PCN reaction causing severe rash involving mucus membranes or skin necrosis: No Has patient had a PCN reaction that required hospitalization: No Has patient had a PCN reaction occurring within the last 10 years: No If all of the above answers are "NO", then may proceed with      Medication List       Accurate as of October 23, 2018 11:59 PM. Always use your most recent med list.        acetaminophen 500 MG tablet Commonly known as:  TYLENOL Take 1,000 mg by mouth every 6 (six) hours as needed for moderate pain or headache.   cetirizine 10 MG tablet Commonly known as:  ZYRTEC Take 10 mg by mouth daily as needed for allergies.   CRANBERRY PO Take 1 capsule by mouth 2 (two) times daily.   vitamin B-12 500 MCG tablet Commonly known as:  CYANOCOBALAMIN Take 500 mcg by mouth daily.   cyanocobalamin 1000 MCG tablet Take 1 tablet (1,000 mcg total) by mouth daily.   docusate sodium 100 MG capsule Commonly known as:  COLACE Take 1 capsule (100 mg total) by mouth 2 (two) times daily.   ferrous sulfate 325 (65 FE) MG tablet Take 1 tablet (325 mg total) by  mouth 2 (two) times daily with a meal.   multivitamin with minerals Tabs tablet Take 1 tablet by mouth daily.   MYRBETRIQ 50 MG Tb24 tablet Generic drug:  mirabegron ER TAKE 1 TABLET DAILY   neomycin-bacitracin-polymyxin ointment Commonly known as:  NEOSPORIN Apply topically.   nystatin powder Commonly known as:  NYAMYC Apply topically 2 (two) times daily.   ondansetron 4 MG tablet Commonly known as:  ZOFRAN Take 1 tablet (4 mg total) by mouth every 6 (six) hours as needed for nausea.   pantoprazole 40 MG tablet Commonly known as:  PROTONIX Take 1 tablet (40 mg total) by mouth daily.   polyethylene glycol powder powder Commonly known as:  MIRALAX Take 17 g by mouth daily as needed. Can increase to 3 times a day as needed for constipation but hold medication if has diarrhea   zinc oxide 20 % ointment Apply 1 application topically as needed for irritation.       Allergies:  Allergies  Allergen Reactions  . Penicillins Other (See Comments)    Caused nervousness as a child. Patient states he took as a teenager w/o problems Has  patient had a PCN reaction causing immediate rash, facial/tongue/throat swelling, SOB or lightheadedness with hypotension: No Has patient had a PCN reaction causing severe rash involving mucus membranes or skin necrosis: No Has patient had a PCN reaction that required hospitalization: No Has patient had a PCN reaction occurring within the last 10 years: No If all of the above answers are "NO", then may proceed with    Family History: Family History  Problem Relation Age of Onset  . Cancer Mother   . Chronic Renal Failure Mother   . Heart disease Father     Social History:  reports that he has never smoked. He has never used smokeless tobacco. He reports that he does not drink alcohol or use drugs.  ROS: UROLOGY Frequent Urination?: No Hard to postpone urination?: No Burning/pain with urination?: No Get up at night to urinate?:  No Leakage of urine?: Yes Urine stream starts and stops?: No Trouble starting stream?: No Do you have to strain to urinate?: No Blood in urine?: No Urinary tract infection?: No Sexually transmitted disease?: No Injury to kidneys or bladder?: No Painful intercourse?: No Weak stream?: No Erection problems?: No Penile pain?: No  Gastrointestinal Nausea?: No Vomiting?: No Indigestion/heartburn?: No Diarrhea?: No Constipation?: No  Constitutional Fever: No Night sweats?: No Weight loss?: No Fatigue?: No  Skin Skin rash/lesions?: No Itching?: No  Eyes Blurred vision?: No Double vision?: No  Ears/Nose/Throat Sore throat?: No Sinus problems?: No  Hematologic/Lymphatic Swollen glands?: No Easy bruising?: No  Cardiovascular Leg swelling?: No Chest pain?: No  Respiratory Cough?: No Shortness of breath?: No  Endocrine Excessive thirst?: No  Musculoskeletal Back pain?: No Joint pain?: No  Neurological Headaches?: No Dizziness?: No  Psychologic Depression?: No Anxiety?: No  Physical Exam: BP 125/75   Pulse (!) 108   Ht 5\' 11"  (1.803 m)   Wt 168 lb (76.2 kg)   BMI 23.43 kg/m   Constitutional:  Alert and oriented, No acute distress.  Accompanied by wife today.  Appears to be improved since last visit, in wheelchair. HEENT: [place] AT, moist mucus membranes.  Trachea midline, no masses. Cardiovascular: No clubbing, cyanosis, or edema. Respiratory: Normal respiratory effort, no increased work of breathing. GI: Abdomen is soft, nontender, nondistended, no abdominal masses GU: Fully catheter in place draining cloudy urine. Skin: No rashes, bruises or suspicious lesions. Neurologic: Grossly intact, no focal deficits, moving all 4 extremities. Psychiatric: Normal mood and affect.  Laboratory Data: Lab Results  Component Value Date   WBC 13.0 (H) 11/02/2018   HGB 12.1 (L) 11/02/2018   HCT 38.7 (L) 11/02/2018   MCV 91.9 11/02/2018   PLT 284 11/02/2018     Lab Results  Component Value Date   CREATININE 1.49 (H) 11/02/2018    Lab Results  Component Value Date   PSA 0.02 12/22/2016   PSA 0.05 12/24/2015   PSA 0.05 05/27/2015   Urinalysis    Component Value Date/Time   COLORURINE AMBER (A) 05/01/2018 1901   APPEARANCEUR Cloudy (A) 10/23/2018 1327   LABSPEC 1.015 05/01/2018 1901   PHURINE 6.0 05/01/2018 1901   GLUCOSEU Negative 10/23/2018 1327   HGBUR SMALL (A) 05/01/2018 1901   BILIRUBINUR Negative 10/23/2018 1327   KETONESUR NEGATIVE 05/01/2018 1901   PROTEINUR 3+ (A) 10/23/2018 1327   PROTEINUR 100 (A) 05/01/2018 1901   NITRITE Positive (A) 10/23/2018 1327   NITRITE NEGATIVE 05/01/2018 1901   LEUKOCYTESUR 3+ (A) 10/23/2018 1327    Lab Results  Component Value Date   LABMICR  See below: 10/23/2018   WBCUA >30 (H) 10/23/2018   RBCUA >30 (H) 10/23/2018   LABEPIT None seen 10/23/2018   MUCUS Present (A) 10/23/2018   BACTERIA Many (A) 10/23/2018    Pertinent Imaging: CT chest abdomen pelvis from 10/22/2018 was personally reviewed today.  Agree with radiologic interpretation.  IMPRESSION: 1. Increasing left hydronephrosis with a double-J left ureteral stent in place. Right-sided hydronephrosis has improved significantly in the interval. Diffuse bladder wall thickening. 2. No evidence of metastatic disease. 3. 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 4. Asbestos related pleural disease. 5. Aortic atherosclerosis (ICD10-170.0). Coronary artery calcification.   Assessment & Plan:    1. Malignant neoplasm of overlapping sites of bladder ([place]) Status post IMRT/abbreviated course of chemo Currently on tech centric No evidence of metastatic disease Locally advanced - Urinalysis, Complete - CULTURE, URINE COMPREHENSIVE  2. Bilateral ureteral obstruction Managed with bilateral ureteral indwelling stents Given the amount of encrustation and stent obstruction the time of last  exchange, recommend to sooner stent exchange on this occasion CT scan reviewed, there is evidence of persistent and increasing left-sided hydronephrosis which is likely secondary to stent obstruction, improved on right We will plan for stent exchange Reviewed risk and benefits in detail including risk of bleeding, infection, damage surrounding structures, sepsis, anesthetic complications  3. Recurrent UTI Chronically colonized We will ensure site-directed antibiotics at the time of stent exchange  4. Urinary retention Small capacity bladder Maintain Foley catheter    [name], MD  [facility] [address], [place], [place] [address] [phone]  I spent 25 min with this patient of which greater than 50% was spent in counseling and coordination of care with the patient.

## 2018-10-27 LAB — CULTURE, URINE COMPREHENSIVE

## 2018-10-29 ENCOUNTER — Telehealth: Payer: Self-pay

## 2018-10-29 MED ORDER — SULFAMETHOXAZOLE-TRIMETHOPRIM 800-160 MG PO TABS: 1.0000 | ORAL_TABLET | Freq: Two times a day (BID) | ORAL | 0 refills | Status: DC

## 2018-10-29 NOTE — Telephone Encounter (Signed)
My chart sent and script sent to pharm

## 2018-10-29 NOTE — Telephone Encounter (Signed)
-----   Message from Nori Riis, PA-C sent at 10/29/2018  9:47 AM EST ----- Please let Vincent Poole know that his urine culture was positive for infection.  He needs to start Septra DS, BID for fourteen days as he has a surgical procedure on 11/12/2018.

## 2018-11-01 ENCOUNTER — Ambulatory Visit: Payer: Medicare Other | Admitting: Oncology

## 2018-11-01 ENCOUNTER — Other Ambulatory Visit: Payer: Medicare Other

## 2018-11-01 ENCOUNTER — Other Ambulatory Visit: Payer: Self-pay

## 2018-11-01 ENCOUNTER — Ambulatory Visit: Payer: Medicare Other

## 2018-11-01 DIAGNOSIS — C61 Malignant neoplasm of prostate: Secondary | ICD-10-CM

## 2018-11-02 ENCOUNTER — Inpatient Hospital Stay (HOSPITAL_BASED_OUTPATIENT_CLINIC_OR_DEPARTMENT_OTHER): Payer: Medicare Other | Admitting: Oncology

## 2018-11-02 ENCOUNTER — Inpatient Hospital Stay: Payer: Medicare Other

## 2018-11-02 ENCOUNTER — Inpatient Hospital Stay: Admission: RE | Admit: 2018-11-02 | Payer: Medicare Other | Source: Ambulatory Visit

## 2018-11-02 ENCOUNTER — Encounter: Payer: Self-pay | Admitting: Oncology

## 2018-11-02 VITALS — BP 108/71 | HR 94 | Temp 97.7°F | Resp 18 | Ht 71.0 in

## 2018-11-02 DIAGNOSIS — E876 Hypokalemia: Secondary | ICD-10-CM

## 2018-11-02 DIAGNOSIS — Z87440 Personal history of urinary (tract) infections: Secondary | ICD-10-CM

## 2018-11-02 DIAGNOSIS — C678 Malignant neoplasm of overlapping sites of bladder: Secondary | ICD-10-CM | POA: Diagnosis not present

## 2018-11-02 DIAGNOSIS — D509 Iron deficiency anemia, unspecified: Secondary | ICD-10-CM

## 2018-11-02 DIAGNOSIS — R54 Age-related physical debility: Secondary | ICD-10-CM

## 2018-11-02 DIAGNOSIS — Z79899 Other long term (current) drug therapy: Secondary | ICD-10-CM

## 2018-11-02 DIAGNOSIS — C61 Malignant neoplasm of prostate: Secondary | ICD-10-CM

## 2018-11-02 DIAGNOSIS — Z9181 History of falling: Secondary | ICD-10-CM

## 2018-11-02 DIAGNOSIS — K219 Gastro-esophageal reflux disease without esophagitis: Secondary | ICD-10-CM

## 2018-11-02 DIAGNOSIS — N136 Pyonephrosis: Secondary | ICD-10-CM | POA: Diagnosis not present

## 2018-11-02 DIAGNOSIS — C7982 Secondary malignant neoplasm of genital organs: Secondary | ICD-10-CM | POA: Diagnosis not present

## 2018-11-02 DIAGNOSIS — R7989 Other specified abnormal findings of blood chemistry: Secondary | ICD-10-CM

## 2018-11-02 DIAGNOSIS — Z5112 Encounter for antineoplastic immunotherapy: Secondary | ICD-10-CM

## 2018-11-02 DIAGNOSIS — R5383 Other fatigue: Secondary | ICD-10-CM

## 2018-11-02 DIAGNOSIS — C689 Malignant neoplasm of urinary organ, unspecified: Secondary | ICD-10-CM

## 2018-11-02 DIAGNOSIS — N179 Acute kidney failure, unspecified: Secondary | ICD-10-CM

## 2018-11-02 DIAGNOSIS — R5381 Other malaise: Secondary | ICD-10-CM

## 2018-11-02 DIAGNOSIS — M1990 Unspecified osteoarthritis, unspecified site: Secondary | ICD-10-CM

## 2018-11-02 DIAGNOSIS — Z8546 Personal history of malignant neoplasm of prostate: Secondary | ICD-10-CM

## 2018-11-02 DIAGNOSIS — I10 Essential (primary) hypertension: Secondary | ICD-10-CM

## 2018-11-02 LAB — CBC WITH DIFFERENTIAL/PLATELET
ABS IMMATURE GRANULOCYTES: 0.13 10*3/uL — AB (ref 0.00–0.07)
Basophils Absolute: 0.1 10*3/uL (ref 0.0–0.1)
Basophils Relative: 1 %
Eosinophils Absolute: 0.2 10*3/uL (ref 0.0–0.5)
Eosinophils Relative: 1 %
HCT: 38.7 % — ABNORMAL LOW (ref 39.0–52.0)
HEMOGLOBIN: 12.1 g/dL — AB (ref 13.0–17.0)
Immature Granulocytes: 1 %
Lymphocytes Relative: 15 %
Lymphs Abs: 1.9 10*3/uL (ref 0.7–4.0)
MCH: 28.7 pg (ref 26.0–34.0)
MCHC: 31.3 g/dL (ref 30.0–36.0)
MCV: 91.9 fL (ref 80.0–100.0)
Monocytes Absolute: 0.9 10*3/uL (ref 0.1–1.0)
Monocytes Relative: 7 %
NEUTROS ABS: 9.9 10*3/uL — AB (ref 1.7–7.7)
Neutrophils Relative %: 75 %
Platelets: 284 10*3/uL (ref 150–400)
RBC: 4.21 MIL/uL — ABNORMAL LOW (ref 4.22–5.81)
RDW: 16.2 % — ABNORMAL HIGH (ref 11.5–15.5)
WBC: 13 10*3/uL — ABNORMAL HIGH (ref 4.0–10.5)
nRBC: 0 % (ref 0.0–0.2)

## 2018-11-02 LAB — COMPREHENSIVE METABOLIC PANEL
ALT: 9 U/L (ref 0–44)
AST: 14 U/L — AB (ref 15–41)
Albumin: 3.5 g/dL (ref 3.5–5.0)
Alkaline Phosphatase: 72 U/L (ref 38–126)
Anion gap: 11 (ref 5–15)
BUN: 29 mg/dL — AB (ref 8–23)
CALCIUM: 9.6 mg/dL (ref 8.9–10.3)
CO2: 23 mmol/L (ref 22–32)
Chloride: 105 mmol/L (ref 98–111)
Creatinine, Ser: 1.49 mg/dL — ABNORMAL HIGH (ref 0.61–1.24)
GFR calc Af Amer: 50 mL/min — ABNORMAL LOW (ref >60)
GFR calc non Af Amer: 43 mL/min — ABNORMAL LOW (ref >60)
Glucose, Bld: 115 mg/dL — ABNORMAL HIGH (ref 70–99)
Potassium: 4.1 mmol/L (ref 3.5–5.1)
Sodium: 139 mmol/L (ref 135–145)
Total Bilirubin: 0.3 mg/dL (ref 0.3–1.2)
Total Protein: 7.4 g/dL (ref 6.5–8.1)

## 2018-11-02 MED ORDER — SODIUM CHLORIDE 0.9 % IV SOLN
Freq: Once | INTRAVENOUS | Status: AC
  Administered 2018-11-02: 10:00:00 via INTRAVENOUS
  Filled 2018-11-02: qty 250

## 2018-11-02 MED ORDER — SODIUM CHLORIDE 0.9 % IV SOLN
510.0000 mg | Freq: Once | INTRAVENOUS | Status: AC
  Administered 2018-11-02: 510 mg via INTRAVENOUS
  Filled 2018-11-02: qty 17

## 2018-11-02 MED ORDER — SODIUM CHLORIDE 0.9 % IV SOLN
1200.0000 mg | Freq: Once | INTRAVENOUS | Status: AC
  Administered 2018-11-02: 1200 mg via INTRAVENOUS
  Filled 2018-11-02: qty 20

## 2018-11-02 MED ORDER — HEPARIN SOD (PORK) LOCK FLUSH 100 UNIT/ML IV SOLN
INTRAVENOUS | Status: AC
  Filled 2018-11-02: qty 5

## 2018-11-02 MED ORDER — HEPARIN SOD (PORK) LOCK FLUSH 100 UNIT/ML IV SOLN
500.0000 [IU] | Freq: Once | INTRAVENOUS | Status: AC | PRN
  Administered 2018-11-02: 500 [IU]

## 2018-11-02 NOTE — Progress Notes (Signed)
No new changes noted today 

## 2018-11-02 NOTE — Progress Notes (Signed)
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 For his prostate cancer he has received IM RT in the past. He has been seeing Dr. Erlene Quan in the past for his recurrent UTIs as well as hematuria. CT scan in July 2018 showed bladder wall thickening with perivascular edema and inflammation in the right ureter greater than the left. Findings were thought to be due to pyelonephritis at that time. He underwent cystoscopy on 12/14/2017 which showed abnormal looking prostate with necrotic material lining the entire surface area. Diffuse copious debris within the bladder appearing to be erythematous without discrete bladder tumor but visualization was poor. He was then admitted to the hospital on 12/26/2017 with symptoms of UTI and sepsis as well as new moderate bilateral hydronephrosis.  CT abdomen and pelvis with contrast on 12/20/2017 again showed market bile bladder wall thickening with perivesicular edema. Within the lumen of the bladder there is a 93.9 cm soft tissue attenuating filling defect. This is indeterminate and could represent an area of blood clot versus urothelial lesion.  He underwent repeat cystoscopy with bilateral pyelogram and ureteral stent placement as well as TURBT and TURP. He was found to have a massive tumor involving the majority of the bladder with grossly necrotic and calcified material. There appeared  to be multifocal disease with large burden of the left anterior bladder wall extending posteriorly as well as adjacent to the bladder neck and beyond the right hemitrigone. Very little normal recognizable bladder mucosa remaining.   Biopsy from TURBT and TURP showed: High-grade urothelial carcinoma with extensive necrosisinvolving both the bladder and the prostate.CT chest did not reveal any evidence of metastatic disease. He was also not found to have any regional adenopathy on CT abdomen  Patient was seen by Dr. Erlene Quan and was not deemed to be a surgical candidate. He has been referred to [place] for definitive treatment options.  Patient lives with his wife at home and ambulates with a cane. He does need assistance with his ADLs to some extent. He has not had any falls and he denies any changes in his appetite or unintentional weight loss. Denies any pain. Reports some fatigue and occasional problems with constipation. He has had 3 hospitalizations last year for urinary tract infections and currently has a chronic Foley for the last 1 month  Dr. Erlene Quan performed interval TURBT on 01/15/18 and was able to debulk tumor as much as possible to reduce tumor burden  Patient received 5 cycles ofcarboplatin/ gemcitabine 2 weeks on and 1 week off ending 04/26/18.Patient did receive radiation for 10 days during cycle 2 of treatment. Given patients age, co-morbidities and frailty- he was not a cisplatin candidate.6th cycle not given due to fall and hip fracture  Patient had a repeat cystoscopy in September 2019 which showed no obvious tumor bladder but it did have a shaggy necrotic appearance at the bladder neck. Prostatic fossa was grossly abnormal necrotic and irregular without papillary change. Bladder neck was biopsied and showed residual high-grade urothelial carcinoma.  Muscularis propria was not seen in that specimen.Due to evidence of recurrent/residual disease patient was started  on Tecentriq on 07/26/2018  Interval history- he was seen by Dr. Erlene Quan and was started on bactrim for uti. Has stent exchange planned next month. He feels he is getting somewhat stronger. He is able to ambulate with some assistance and with the help of walker.   ECOG PS- 2 Pain scale- 0 Opioid associated constipation- no  Review of systems- Review of Systems  Constitutional: Positive for malaise/fatigue. Negative for chills, fever and weight loss.  HENT: Negative for congestion, ear discharge and nosebleeds.   Eyes: Negative for blurred vision.  Respiratory: Negative for cough, hemoptysis, sputum production, shortness of breath and wheezing.   Cardiovascular: Negative for chest pain, palpitations, orthopnea and claudication.  Gastrointestinal: Negative for abdominal pain, blood in stool, constipation, diarrhea, heartburn, melena, nausea and vomiting.  Genitourinary: Negative for dysuria, flank pain, frequency, hematuria and urgency.  Musculoskeletal: Negative for back pain, joint pain and myalgias.  Skin: Negative for rash.  Neurological: Negative for dizziness, tingling, focal weakness, seizures, weakness and headaches.  Endo/Heme/Allergies: Does not bruise/bleed easily.  Psychiatric/Behavioral: Negative for depression and suicidal ideas. The patient does not have insomnia.        Allergies  Allergen Reactions  . Penicillins Other (See Comments)    Caused nervousness as a child. Patient states he took as a teenager w/o problems Has patient had a PCN reaction causing immediate rash, facial/tongue/throat swelling, SOB or lightheadedness with hypotension: No Has patient had a PCN reaction causing severe rash involving mucus membranes or skin necrosis: No Has patient had a PCN reaction that required hospitalization: No Has patient had a PCN reaction occurring within the last 10 years: No If all of the above answers are "NO", then may proceed with     Past Medical History:    Diagnosis Date  . Anxiety   . Arthritis   . Bladder cancer ([place])   . Dysrhythmia 07/2018   history of atrial flutter that worsens with anxiety  . Femur fracture, right ([place]) 05/01/2018  . GERD (gastroesophageal reflux disease)   . History of recent blood transfusion 05/2018  . Hypertension   . Iron deficiency anemia 09/14/2018  . Prostate cancer ([place]) 07/2018   cancer growing in prostate but not prostate cancer, it is from the bladder  . Umbilical hernia 24/5809  . Urinary retention 2019   foley catheter place 11/2017  . UTI (urinary tract infection) 2019   frequent UTI's over last year  . Wound eschar of foot 07/2018   left heal getting wrapped and requiring antibiotic cream. cracks open with weight bearing.     Past Surgical History:  Procedure Laterality Date  . CARPAL TUNNEL RELEASE Right   . CHOLECYSTECTOMY  2004  . CYSTOGRAM  07/18/2018   Procedure: CYSTOGRAM;  Surgeon: [name], MD;  Location: [facility];  Service: Urology;;  . Consuela Mimes W/ RETROGRADES Bilateral 07/18/2018   Procedure: CYSTOSCOPY WITH RETROGRADE PYELOGRAM;  Surgeon: [name], MD;  Location: [facility];  Service: Urology;  Laterality: Bilateral;  . CYSTOSCOPY W/ URETERAL STENT PLACEMENT Bilateral 12/27/2017   Procedure: CYSTOSCOPY WITH RETROGRADE PYELOGRAM/URETERAL STENT PLACEMENT;  Surgeon: [name], MD;  Location: [facility];  Service: Urology;  Laterality: Bilateral;  . CYSTOSCOPY W/ URETERAL STENT PLACEMENT Bilateral 07/18/2018   Procedure: CYSTOSCOPY WITH STENT REPLACEMENT (exchange);  Surgeon: [name], MD;  Location: [facility];  Service: Urology;  Laterality: Bilateral;  . INTRAMEDULLARY (IM) NAIL INTERTROCHANTERIC  Right 05/02/2018   Procedure: INTRAMEDULLARY (IM) NAIL INTERTROCHANTRIC;  Surgeon: [name], MD;  Location: [facility];  Service: Orthopedics;  Laterality: Right;  . LEG TENDON SURGERY Right 1958  . PORTA CATH INSERTION N/A 01/22/2018   Procedure: PORTA CATH  INSERTION;  Surgeon: [name], MD;  Location: [facility];  Service: Cardiovascular;  Laterality: N/A;  . TRANSURETHRAL RESECTION OF BLADDER TUMOR N/A 12/27/2017   Procedure: TRANSURETHRAL RESECTION OF BLADDER TUMOR (TURBT);  Surgeon: [name], MD;  Location: [facility];  Service: Urology;  Laterality: N/A;  . TRANSURETHRAL RESECTION OF BLADDER TUMOR N/A 01/15/2018   Procedure: TRANSURETHRAL RESECTION OF BLADDER TUMOR (TURBT);  Surgeon: [name], MD;  Location: [facility];  Service: Urology;  Laterality: N/A;  Need 2 hrs for this case please  . TRANSURETHRAL RESECTION OF BLADDER TUMOR N/A 07/18/2018   Procedure: TRANSURETHRAL RESECTION OF BLADDER TUMOR (TURBT);  Surgeon: [name], MD;  Location: [facility];  Service: Urology;  Laterality: N/A;    Social History   Socioeconomic History  . Marital status: Married    Spouse name: ruth  . Number of children: Not on file  . Years of education: Not on file  . Highest education level: Not on file  Occupational History  . Occupation: retired    Comment: Therapist, [employer]  Social Needs  . Financial resource strain: Not on file  . Food insecurity:    Worry: Not on file    Inability: Not on file  . Transportation needs:    Medical: Not on file    Non-medical: Not on file  Tobacco Use  . Smoking status: Never Smoker  . Smokeless tobacco: Never Used  Substance and Sexual Activity  . Alcohol use: No    Alcohol/week: 0.0 standard drinks  . Drug use: No  . Sexual activity: Not Currently  Lifestyle  . Physical activity:    Days per week: Not on file    Minutes per session: Not on file  . Stress: Not on file  Relationships  . Social connections:    Talks on phone: Not on file    Gets together: Not on file    Attends religious service: Not on file    Active member of club or organization: Not on file    Attends meetings of clubs or organizations: Not on file    Relationship status: Not on file  . Intimate partner  violence:    Fear of current or ex partner: Not on file    Emotionally abused: Not on file    Physically abused: Not on file    Forced sexual activity: Not on file  Other Topics Concern  . Not on file  Social History Narrative  . Not on file    Family History  Problem Relation Age of Onset  . Cancer Mother   . Chronic Renal Failure Mother   . Heart disease Father      Current Outpatient Medications:  .  acetaminophen (TYLENOL) 500 MG tablet, Take 1,000 mg by mouth every 6 (six) hours as needed for moderate pain or headache. , Disp: , Rfl:  .  cetirizine (ZYRTEC) 10 MG tablet, Take 10 mg by mouth daily., Disp: , Rfl:  .  CRANBERRY PO, Take 1 capsule by mouth 2 (two) times daily. , Disp: , Rfl:  .  ferrous sulfate 325 (65 FE) MG tablet, Take 1 tablet (325 mg total) by mouth 2 (two) times daily with a meal., Disp: ,  Rfl: 3 .  Multiple Vitamin (MULTIVITAMIN WITH MINERALS) TABS tablet, Take 1 tablet by mouth daily., Disp: , Rfl:  .  MYRBETRIQ 50 MG TB24 tablet, TAKE 1 TABLET DAILY (Patient taking differently: Take 50 mg by mouth daily. ), Disp: 30 tablet, Rfl: 3 .  nystatin (NYAMYC) powder, Apply topically 2 (two) times daily. (Patient taking differently: Apply 1 g topically daily. ), Disp: 60 g, Rfl: 3 .  polyethylene glycol powder (MIRALAX) powder, Take 17 g by mouth daily as needed. Can increase to 3 times a day as needed for constipation but hold medication if has diarrhea (Patient taking differently: Take 17 g by mouth daily as needed for moderate constipation. Can increase to 3 times a day as needed for constipation but hold medication if has diarrhea), Disp: 255 g, Rfl: 0 .  sulfamethoxazole-trimethoprim (BACTRIM DS,SEPTRA DS) 800-160 MG tablet, Take 1 tablet by mouth every 12 (twelve) hours., Disp: 28 tablet, Rfl: 0 .  vitamin B-12 (CYANOCOBALAMIN) 500 MCG tablet, Take 500 mcg by mouth daily., Disp: , Rfl:  .  zinc oxide 20 % ointment, Apply 1 application topically as needed for  irritation., Disp: , Rfl:   Physical exam: There were no vitals filed for this visit. Physical Exam Constitutional:      General: He is not in acute distress. HENT:     Head: Normocephalic and atraumatic.  Eyes:     Pupils: Pupils are equal, round, and reactive to light.  Neck:     Musculoskeletal: Normal range of motion.  Cardiovascular:     Rate and Rhythm: Normal rate and regular rhythm.     Heart sounds: Normal heart sounds.  Pulmonary:     Effort: Pulmonary effort is normal.     Breath sounds: Normal breath sounds.  Abdominal:     General: Bowel sounds are normal.     Palpations: Abdomen is soft.     Comments: Foley draining clear urine  Skin:    General: Skin is warm and dry.  Neurological:     Mental Status: He is alert and oriented to person, place, and time.      CMP Latest Ref Rng & Units 10/19/2018  Glucose 70 - 99 mg/dL 121(H)  BUN 8 - 23 mg/dL 18  Creatinine 0.61 - 1.24 mg/dL 1.25(H)  Sodium 135 - 145 mmol/L 141  Potassium 3.5 - 5.1 mmol/L 3.2(L)  Chloride 98 - 111 mmol/L 106  CO2 22 - 32 mmol/L 25  Calcium 8.9 - 10.3 mg/dL 9.0  Total Protein 6.5 - 8.1 g/dL -  Total Bilirubin 0.3 - 1.2 mg/dL -  Alkaline Phos 38 - 126 U/L -  AST 15 - 41 U/L -  ALT 0 - 44 U/L -   CBC Latest Ref Rng & Units 10/19/2018  WBC 4.0 - 10.5 K/uL 11.0(H)  Hemoglobin 13.0 - 17.0 g/dL 10.4(L)  Hematocrit 39.0 - 52.0 % 33.2(L)  Platelets 150 - 400 K/uL 321    No images are attached to the encounter.  Ct Chest W Contrast  Result Date: 10/22/2018 CLINICAL DATA:  Locally advanced high-grade urothelial carcinoma, status post therapy with residual/recurrent tumor. EXAM: CT CHEST, ABDOMEN, AND PELVIS WITH CONTRAST TECHNIQUE: Multidetector CT imaging of the chest, abdomen and pelvis was performed following the standard protocol during bolus administration of intravenous contrast. CONTRAST:  174mL ISOVUE-300 IOPAMIDOL (ISOVUE-300) INJECTION 61% COMPARISON:  07/12/2018. FINDINGS: CT  CHEST FINDINGS Cardiovascular: Right IJ Port-A-Cath terminates in the SVC. Atherosclerotic calcification of the aorta and coronary arteries.  Heart size normal. No pericardial effusion. Mediastinum/Nodes: No pathologically enlarged mediastinal, hilar or axillary lymph nodes. Esophagus is grossly unremarkable. Lungs/Pleura: Favor scarring in the apical segment right upper lobe. Mild basilar bronchiectasis. Bilateral pleural calcifications. Lungs are otherwise clear. No pleural fluid. Airway is unremarkable. Musculoskeletal: Large area of lucency in the T10 vertebral body is unchanged. Degenerative changes in the spine and shoulders. CT ABDOMEN PELVIS FINDINGS Hepatobiliary: Liver is unremarkable. Cholecystectomy. No biliary ductal dilatation. Pancreas: Calcifications are seen in the pancreas. No ductal dilatation. Spleen: Negative. Adrenals/Urinary Tract: Adrenal glands are unremarkable. Low-attenuation lesions in the kidneys measure up to 2.8 cm on the right and are likely cysts. Double-J ureteral stents are in place bilaterally. Right ureter is decompressed. There is increasing dilatation of the left intrarenal collecting system and left ureter when compared with 07/12/2018. Small amount of air in the left ureter is presumably iatrogenic. Foley catheter is seen in a low volume bladder which appears thick-walled. Stomach/Bowel: Stomach is unremarkable. Duodenal diverticula are incidentally noted. Small bowel and appendix are otherwise unremarkable. Stool is seen throughout the colon, indicative of constipation. Vascular/Lymphatic: Atherosclerotic calcification throughout the arterial vasculature without abdominal aortic aneurysm. Retroperitoneal lymph nodes are not enlarged by CT size criteria. No pathologically enlarged lymph nodes. Reproductive: Prostate is visualized. Other: Mild nonspecific presacral edema. No free fluid. Mesenteries and peritoneum are otherwise unremarkable. Musculoskeletal: Postoperative  changes in the right proximal femur. Advanced degenerative changes in both hips, right greater than left. Degenerative changes in the spine. Lucency within the L2 vertebral body, unchanged. No worrisome lytic or sclerotic lesions. IMPRESSION: 1. Increasing left hydronephrosis with a double-J left ureteral stent in place. Right-sided hydronephrosis has improved significantly in the interval. Diffuse bladder wall thickening. 2. No evidence of metastatic disease. 3. 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 4. Asbestos related pleural disease. 5. Aortic atherosclerosis (ICD10-170.0). Coronary artery calcification. Electronically Signed   By: Lorin Picket M.D.   On: 10/22/2018 13:29   Ct Abdomen Pelvis W Contrast  Result Date: 10/22/2018 CLINICAL DATA:  Locally advanced high-grade urothelial carcinoma, status post therapy with residual/recurrent tumor. EXAM: CT CHEST, ABDOMEN, AND PELVIS WITH CONTRAST TECHNIQUE: Multidetector CT imaging of the chest, abdomen and pelvis was performed following the standard protocol during bolus administration of intravenous contrast. CONTRAST:  174mL ISOVUE-300 IOPAMIDOL (ISOVUE-300) INJECTION 61% COMPARISON:  07/12/2018. FINDINGS: CT CHEST FINDINGS Cardiovascular: Right IJ Port-A-Cath terminates in the SVC. Atherosclerotic calcification of the aorta and coronary arteries. Heart size normal. No pericardial effusion. Mediastinum/Nodes: No pathologically enlarged mediastinal, hilar or axillary lymph nodes. Esophagus is grossly unremarkable. Lungs/Pleura: Favor scarring in the apical segment right upper lobe. Mild basilar bronchiectasis. Bilateral pleural calcifications. Lungs are otherwise clear. No pleural fluid. Airway is unremarkable. Musculoskeletal: Large area of lucency in the T10 vertebral body is unchanged. Degenerative changes in the spine and shoulders. CT ABDOMEN PELVIS FINDINGS Hepatobiliary: Liver is unremarkable. Cholecystectomy.  No biliary ductal dilatation. Pancreas: Calcifications are seen in the pancreas. No ductal dilatation. Spleen: Negative. Adrenals/Urinary Tract: Adrenal glands are unremarkable. Low-attenuation lesions in the kidneys measure up to 2.8 cm on the right and are likely cysts. Double-J ureteral stents are in place bilaterally. Right ureter is decompressed. There is increasing dilatation of the left intrarenal collecting system and left ureter when compared with 07/12/2018. Small amount of air in the left ureter is presumably iatrogenic. Foley catheter is seen in a low volume bladder which appears thick-walled. Stomach/Bowel: Stomach is unremarkable. Duodenal diverticula are incidentally noted. Small bowel  and appendix are otherwise unremarkable. Stool is seen throughout the colon, indicative of constipation. Vascular/Lymphatic: Atherosclerotic calcification throughout the arterial vasculature without abdominal aortic aneurysm. Retroperitoneal lymph nodes are not enlarged by CT size criteria. No pathologically enlarged lymph nodes. Reproductive: Prostate is visualized. Other: Mild nonspecific presacral edema. No free fluid. Mesenteries and peritoneum are otherwise unremarkable. Musculoskeletal: Postoperative changes in the right proximal femur. Advanced degenerative changes in both hips, right greater than left. Degenerative changes in the spine. Lucency within the L2 vertebral body, unchanged. No worrisome lytic or sclerotic lesions. IMPRESSION: 1. Increasing left hydronephrosis with a double-J left ureteral stent in place. Right-sided hydronephrosis has improved significantly in the interval. Diffuse bladder wall thickening. 2. No evidence of metastatic disease. 3. 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 4. Asbestos related pleural disease. 5. Aortic atherosclerosis (ICD10-170.0). Coronary artery calcification. Electronically Signed   By: Lorin Picket M.D.   On: 10/22/2018  13:29     Assessment and plan- Patient is a 83 y.o. 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 He is here for on treatment assessment prior to cycle 5 of Tecentriq  Counts okay to proceed with cycle 5 of Tecentriq today.  His creatinine is again elevated to 1.4 probably recent CT with contrast as well as findings on CT which shows increasing hydronephrosis.  I will give him 1 L of IV fluids today.  Patient does have moderate anemia likely secondary to prior chemotherapy as well as a component of iron deficiency.  He will be getting a second dose of Feraheme today.  I have reviewed CT chest abdomen and pelvis images independently and discussed findings with the patient.  He continues to have bladder wall thickening but no evidence of distant metastatic disease.  Patient will be undergoing a left ureteral stent exchange next month.  He was noted to have increasing left hydronephrosis on his CT scan.  I will see him back in 3 weeks time with CBC and CMP for cycle 6 of Tecentriq   Visit Diagnosis 1. Urothelial cancer ([employer])   2. Encounter for antineoplastic immunotherapy   3. Malignant neoplasm of overlapping sites of bladder [facility])      Dr. Randa Evens, MD, MPH [facility] at [facility] [phone] 11/02/2018 11:51 AM

## 2018-11-08 ENCOUNTER — Encounter
Admission: RE | Admit: 2018-11-08 | Discharge: 2018-11-08 | Disposition: A | Discharge status: Home / Self Care | Payer: Medicare Other | Source: Ambulatory Visit | Attending: Urology | Admitting: Urology

## 2018-11-08 ENCOUNTER — Other Ambulatory Visit: Payer: Self-pay

## 2018-11-08 NOTE — Patient Instructions (Signed)
  Your procedure is scheduled on: Monday November 12, 2018 Report to Same Day Surgery 2nd floor [facility] Entrance-take elevator on left to 2nd floor.  Check in with surgery information desk.) To find out your arrival time, call [phone] 1:00-3:00 PM on Friday November 09, 2018  Remember: Instructions that are not followed completely may result in serious medical risk, up to and including death, or upon the discretion of your surgeon and anesthesiologist your surgery may need to be rescheduled.    __x__ 1. Do not eat food (including mints, candies, chewing gum) after midnight the night before your procedure. You may drink clear liquids up to 2 hours before you are scheduled to arrive at the hospital for your procedure.  Do not drink anything within 2 hours of your scheduled arrival to the hospital.  Approved clear liquids:  --Water or Apple juice without pulp  --Clear carbohydrate beverage such as Gatorade or Powerade  --Black Coffee or Clear Tea (No milk, no creamers, do not add anything to the coffee or tea)    __x__ 2. No Alcohol for 24 hours before or after surgery.   __x__ 3. No Smoking or e-cigarettes for 24 hours before surgery.  Do not use any chewable tobacco products for at least 6 hours before surgery.   __x__ 4. Notify your doctor if there is any change in your medical condition (cold, fever, infections).   __x__ 5. On the morning of surgery brush your teeth with toothpaste and water.  You may rinse your mouth with mouthwash if you wish.  Do not swallow any toothpaste or mouthwash.   __x__ Use antibacterial soap such as Dial to shower/bathe on the day of surgery.   Do not wear jewelry on the day of surgery.  Do not wear lotions, powders, deodorant, or perfumes.   Do not bring valuables to the hospital.    [facility] is not responsible for any belongings or valuables.               Contacts, dentures or bridgework may not be worn into surgery.  For  patients discharged on the day of surgery, you will NOT be permitted to drive yourself home.  You must have a responsible adult with you for 24 hours after surgery.  __x__ Take these medicines with a SMALL SIP OF WATER on the morning of surgery:  1. Sulfa-Trimethoprim (Bactrim)  2. Cetirizine (Zyrtec) if needed  3. Acetaminophen (Tylenol) if needed  __x__ Follow recommendations from Cardiologist, Pulmonologist or PCP regarding stopping Aspirin, Coumadin, Plavix, Eliquis, Effient, Pradaxa, and Pletal.  __x__ TODAY: Stop Anti-inflammatories such as Advil, Ibuprofen, Motrin, Aleve, Naproxen, Naprosyn, BC/Goodies powders or aspirin products. You may continue to take Tylenol and Celebrex.   __x__ TODAY: Stop supplements (Iron, Cranberry) until after surgery. You may continue to take Vitamin D, Vitamin B, and multivitamin.

## 2018-11-09 ENCOUNTER — Ambulatory Visit: Payer: Medicare Other | Admitting: Urology

## 2018-11-09 ENCOUNTER — Ambulatory Visit (INDEPENDENT_AMBULATORY_CARE_PROVIDER_SITE_OTHER): Payer: Medicare Other

## 2018-11-09 ENCOUNTER — Telehealth: Payer: Self-pay | Admitting: Urology

## 2018-11-09 VITALS — BP 132/77 | HR 102

## 2018-11-09 DIAGNOSIS — R339 Retention of urine, unspecified: Secondary | ICD-10-CM

## 2018-11-09 NOTE — Progress Notes (Signed)
Simple Catheter Placement  Due to urinary retention patient is present today for a foley cath placement.  Patient was cleaned and prepped in a sterile fashion with betadine and lidocaine jelly 2% was instilled into the urethra.  A 16 FR Coude foley catheter was inserted, urine return was noted  71m, urine was yellow in color.  The balloon was filled with 10cc of sterile water.  A overnight bag was attached for drainage. Patient was also given a night bag to take home and was given instruction on how to change from one bag to another.  Patient was given instruction on proper catheter care.  Patient tolerated well, complications were noted as: some resistence met and pain with catheter balloon inflation.  Dr. SBernardo Heaterwas asked to come in and check placement.    Preformed by: SFonnie Jarvis CMA  Additional notes/ Follow up: as scheduled for surgery next week

## 2018-11-09 NOTE — Telephone Encounter (Signed)
Pt wife lmom stating her husband's "foley fell out again" spoke to [place] who advised to have pt come in right away. Called wife back and advised her to please bring the pt in as soon as she could get him here. Wife states she will be on the way shortly.

## 2018-11-12 ENCOUNTER — Ambulatory Visit: Payer: Medicare Other | Admitting: Anesthesiology

## 2018-11-12 ENCOUNTER — Inpatient Hospital Stay: Payer: Medicare Other

## 2018-11-12 ENCOUNTER — Other Ambulatory Visit: Payer: Self-pay

## 2018-11-12 ENCOUNTER — Encounter: Payer: Self-pay | Admitting: *Deleted

## 2018-11-12 ENCOUNTER — Inpatient Hospital Stay
Admission: RE | Admit: 2018-11-12 | Discharge: 2018-11-16 | DRG: 659 | Disposition: A | Discharge status: Home / Self Care | Payer: Medicare Other | Attending: Family Medicine | Admitting: Family Medicine

## 2018-11-12 ENCOUNTER — Encounter
Admission: RE | Disposition: A | Discharge status: Home / Self Care | Payer: Self-pay | Source: Home / Self Care | Attending: Internal Medicine

## 2018-11-12 DIAGNOSIS — F419 Anxiety disorder, unspecified: Secondary | ICD-10-CM | POA: Diagnosis present

## 2018-11-12 DIAGNOSIS — I129 Hypertensive chronic kidney disease with stage 1 through stage 4 chronic kidney disease, or unspecified chronic kidney disease: Secondary | ICD-10-CM | POA: Diagnosis present

## 2018-11-12 DIAGNOSIS — Z87440 Personal history of urinary (tract) infections: Secondary | ICD-10-CM | POA: Diagnosis not present

## 2018-11-12 DIAGNOSIS — Z79899 Other long term (current) drug therapy: Secondary | ICD-10-CM | POA: Diagnosis not present

## 2018-11-12 DIAGNOSIS — N390 Urinary tract infection, site not specified: Secondary | ICD-10-CM | POA: Diagnosis present

## 2018-11-12 DIAGNOSIS — D65 Disseminated intravascular coagulation [defibrination syndrome]: Secondary | ICD-10-CM

## 2018-11-12 DIAGNOSIS — E872 Acidosis: Secondary | ICD-10-CM | POA: Diagnosis present

## 2018-11-12 DIAGNOSIS — C7919 Secondary malignant neoplasm of other urinary organs: Secondary | ICD-10-CM | POA: Diagnosis present

## 2018-11-12 DIAGNOSIS — A419 Sepsis, unspecified organism: Secondary | ICD-10-CM | POA: Diagnosis present

## 2018-11-12 DIAGNOSIS — K219 Gastro-esophageal reflux disease without esophagitis: Secondary | ICD-10-CM | POA: Diagnosis present

## 2018-11-12 DIAGNOSIS — N179 Acute kidney failure, unspecified: Secondary | ICD-10-CM | POA: Diagnosis present

## 2018-11-12 DIAGNOSIS — A4101 Sepsis due to Methicillin susceptible Staphylococcus aureus: Secondary | ICD-10-CM

## 2018-11-12 DIAGNOSIS — N135 Crossing vessel and stricture of ureter without hydronephrosis: Secondary | ICD-10-CM

## 2018-11-12 DIAGNOSIS — R0602 Shortness of breath: Secondary | ICD-10-CM

## 2018-11-12 DIAGNOSIS — R6521 Severe sepsis with septic shock: Secondary | ICD-10-CM | POA: Diagnosis present

## 2018-11-12 DIAGNOSIS — Z880 Allergy status to penicillin: Secondary | ICD-10-CM | POA: Diagnosis not present

## 2018-11-12 DIAGNOSIS — D509 Iron deficiency anemia, unspecified: Secondary | ICD-10-CM | POA: Diagnosis present

## 2018-11-12 DIAGNOSIS — Z960 Presence of urogenital implants: Secondary | ICD-10-CM | POA: Diagnosis present

## 2018-11-12 DIAGNOSIS — N183 Chronic kidney disease, stage 3 (moderate): Secondary | ICD-10-CM | POA: Diagnosis present

## 2018-11-12 DIAGNOSIS — K429 Umbilical hernia without obstruction or gangrene: Secondary | ICD-10-CM | POA: Diagnosis present

## 2018-11-12 DIAGNOSIS — Z515 Encounter for palliative care: Secondary | ICD-10-CM | POA: Diagnosis not present

## 2018-11-12 DIAGNOSIS — Z841 Family history of disorders of kidney and ureter: Secondary | ICD-10-CM

## 2018-11-12 DIAGNOSIS — Z8551 Personal history of malignant neoplasm of bladder: Secondary | ICD-10-CM | POA: Diagnosis not present

## 2018-11-12 DIAGNOSIS — C678 Malignant neoplasm of overlapping sites of bladder: Secondary | ICD-10-CM

## 2018-11-12 DIAGNOSIS — Y846 Urinary catheterization as the cause of abnormal reaction of the patient, or of later complication, without mention of misadventure at the time of the procedure: Secondary | ICD-10-CM | POA: Diagnosis present

## 2018-11-12 DIAGNOSIS — N1330 Unspecified hydronephrosis: Secondary | ICD-10-CM

## 2018-11-12 DIAGNOSIS — Z8546 Personal history of malignant neoplasm of prostate: Secondary | ICD-10-CM

## 2018-11-12 DIAGNOSIS — Z8249 Family history of ischemic heart disease and other diseases of the circulatory system: Secondary | ICD-10-CM

## 2018-11-12 DIAGNOSIS — Z9049 Acquired absence of other specified parts of digestive tract: Secondary | ICD-10-CM

## 2018-11-12 DIAGNOSIS — T83511A Infection and inflammatory reaction due to indwelling urethral catheter, initial encounter: Secondary | ICD-10-CM | POA: Diagnosis present

## 2018-11-12 DIAGNOSIS — R6520 Severe sepsis without septic shock: Secondary | ICD-10-CM

## 2018-11-12 DIAGNOSIS — N136 Pyonephrosis: Secondary | ICD-10-CM | POA: Diagnosis present

## 2018-11-12 HISTORY — PX: CYSTOSCOPY WITH STENT PLACEMENT: SHX5790

## 2018-11-12 LAB — CBC
HCT: 29.2 % — ABNORMAL LOW (ref 39.0–52.0)
HCT: 36.4 % — ABNORMAL LOW (ref 39.0–52.0)
Hemoglobin: 9.2 g/dL — ABNORMAL LOW (ref 13.0–17.0)
Hemoglobin: 11.3 g/dL — ABNORMAL LOW (ref 13.0–17.0)
MCH: 30 pg (ref 26.0–34.0)
MCH: 29 pg (ref 26.0–34.0)
MCHC: 31.5 g/dL (ref 30.0–36.0)
MCHC: 31 g/dL (ref 30.0–36.0)
MCV: 95.1 fL (ref 80.0–100.0)
MCV: 93.6 fL (ref 80.0–100.0)
PLATELETS: 165 10*3/uL (ref 150–400)
Platelets: 191 10*3/uL (ref 150–400)
RBC: 3.07 MIL/uL — ABNORMAL LOW (ref 4.22–5.81)
RBC: 3.89 MIL/uL — ABNORMAL LOW (ref 4.22–5.81)
RDW: 17.3 % — ABNORMAL HIGH (ref 11.5–15.5)
RDW: 17.4 % — AB (ref 11.5–15.5)
WBC: 8.8 10*3/uL (ref 4.0–10.5)
WBC: 16.9 10*3/uL — ABNORMAL HIGH (ref 4.0–10.5)
nRBC: 0 % (ref 0.0–0.2)
nRBC: 0 % (ref 0.0–0.2)

## 2018-11-12 LAB — BASIC METABOLIC PANEL
Anion gap: 9 (ref 5–15)
BUN: 20 mg/dL (ref 8–23)
CO2: 19 mmol/L — ABNORMAL LOW (ref 22–32)
CREATININE: 1.31 mg/dL — AB (ref 0.61–1.24)
Calcium: 7.8 mg/dL — ABNORMAL LOW (ref 8.9–10.3)
Chloride: 111 mmol/L (ref 98–111)
GFR calc Af Amer: 59 mL/min — ABNORMAL LOW (ref >60)
GFR calc non Af Amer: 51 mL/min — ABNORMAL LOW (ref >60)
Glucose, Bld: 88 mg/dL (ref 70–99)
Potassium: 3.5 mmol/L (ref 3.5–5.1)
Sodium: 139 mmol/L (ref 135–145)

## 2018-11-12 LAB — LACTIC ACID, PLASMA
Lactic Acid, Venous: 2.1 mmol/L (ref 0.5–1.9)
Lactic Acid, Venous: 2.9 mmol/L (ref 0.5–1.9)

## 2018-11-12 LAB — CREATININE, SERUM
Creatinine, Ser: 1.53 mg/dL — ABNORMAL HIGH (ref 0.61–1.24)
GFR calc Af Amer: 49 mL/min — ABNORMAL LOW (ref >60)
GFR calc non Af Amer: 42 mL/min — ABNORMAL LOW (ref >60)

## 2018-11-12 LAB — PROCALCITONIN: Procalcitonin: 45.35 ng/mL

## 2018-11-12 SURGERY — CYSTOSCOPY, WITH STENT INSERTION
Anesthesia: General | Laterality: Bilateral

## 2018-11-12 MED ORDER — MIRABEGRON ER 50 MG PO TB24
50.0000 mg | ORAL_TABLET | Freq: Every day | ORAL | Status: DC
  Administered 2018-11-12 – 2018-11-16 (×5): 50 mg via ORAL
  Filled 2018-11-12 (×5): qty 1

## 2018-11-12 MED ORDER — FENTANYL CITRATE (PF) 100 MCG/2ML IJ SOLN
50.0000 ug | Freq: Once | INTRAMUSCULAR | Status: AC
  Administered 2018-11-12: 50 ug via INTRAVENOUS

## 2018-11-12 MED ORDER — DOCUSATE SODIUM 100 MG PO CAPS
100.0000 mg | ORAL_CAPSULE | Freq: Two times a day (BID) | ORAL | Status: DC
  Administered 2018-11-13 – 2018-11-16 (×6): 100 mg via ORAL
  Filled 2018-11-12 (×6): qty 1

## 2018-11-12 MED ORDER — ONDANSETRON HCL 4 MG PO TABS
ORAL_TABLET | ORAL | Status: AC
  Filled 2018-11-12: qty 1

## 2018-11-12 MED ORDER — FENTANYL CITRATE (PF) 100 MCG/2ML IJ SOLN
INTRAMUSCULAR | Status: DC | PRN
  Administered 2018-11-12 (×2): 50 ug via INTRAVENOUS

## 2018-11-12 MED ORDER — FAMOTIDINE 20 MG PO TABS
ORAL_TABLET | ORAL | Status: AC
  Filled 2018-11-12: qty 1

## 2018-11-12 MED ORDER — ACETAMINOPHEN 325 MG PO TABS
ORAL_TABLET | ORAL | Status: AC
  Filled 2018-11-12: qty 2

## 2018-11-12 MED ORDER — SODIUM CHLORIDE 0.9 % IV BOLUS
500.0000 mL | Freq: Once | INTRAVENOUS | Status: AC
  Administered 2018-11-12: 500 mL via INTRAVENOUS

## 2018-11-12 MED ORDER — FENTANYL CITRATE (PF) 100 MCG/2ML IJ SOLN
INTRAMUSCULAR | Status: AC
  Administered 2018-11-12: 25 ug via INTRAVENOUS
  Filled 2018-11-12: qty 2

## 2018-11-12 MED ORDER — ONDANSETRON HCL 4 MG/2ML IJ SOLN
4.0000 mg | Freq: Four times a day (QID) | INTRAMUSCULAR | Status: DC | PRN
  Administered 2018-11-13: 4 mg via INTRAVENOUS
  Filled 2018-11-12: qty 2

## 2018-11-12 MED ORDER — TRAZODONE HCL 50 MG PO TABS: 25.0000 mg | ORAL_TABLET | Freq: Every evening | ORAL | Status: DC | PRN

## 2018-11-12 MED ORDER — SODIUM CHLORIDE 0.9 % IV SOLN
2.0000 g | Freq: Three times a day (TID) | INTRAVENOUS | Status: DC
  Filled 2018-11-12 (×3): qty 2

## 2018-11-12 MED ORDER — ONDANSETRON 4 MG PO TBDP: 4.0000 mg | ORAL_TABLET | Freq: Once | ORAL | Status: DC

## 2018-11-12 MED ORDER — LIDOCAINE HCL (CARDIAC) PF 100 MG/5ML IV SOSY
PREFILLED_SYRINGE | INTRAVENOUS | Status: DC | PRN
  Administered 2018-11-12: 80 mg via INTRAVENOUS

## 2018-11-12 MED ORDER — HYDROCODONE-ACETAMINOPHEN 5-325 MG PO TABS
1.0000 | ORAL_TABLET | ORAL | Status: DC | PRN
  Administered 2018-11-13: 1 via ORAL
  Administered 2018-11-13: 2 via ORAL
  Administered 2018-11-15: 12:00:00 1 via ORAL
  Filled 2018-11-12: qty 2
  Filled 2018-11-12 (×2): qty 1

## 2018-11-12 MED ORDER — FAMOTIDINE 20 MG PO TABS
20.0000 mg | ORAL_TABLET | Freq: Once | ORAL | Status: AC
  Administered 2018-11-12: 20 mg via ORAL

## 2018-11-12 MED ORDER — ENOXAPARIN SODIUM 40 MG/0.4ML ~~LOC~~ SOLN
40.0000 mg | SUBCUTANEOUS | Status: DC
  Administered 2018-11-13 – 2018-11-16 (×3): 40 mg via SUBCUTANEOUS
  Filled 2018-11-12 (×4): qty 0.4

## 2018-11-12 MED ORDER — METOPROLOL TARTRATE 5 MG/5ML IV SOLN
INTRAVENOUS | Status: AC
  Administered 2018-11-12: 5 mg via INTRAVENOUS
  Filled 2018-11-12: qty 5

## 2018-11-12 MED ORDER — ONDANSETRON HCL 4 MG/2ML IJ SOLN
INTRAMUSCULAR | Status: AC
  Administered 2018-11-12: 4 mg via INTRAVENOUS
  Filled 2018-11-12: qty 2

## 2018-11-12 MED ORDER — SODIUM CHLORIDE 0.9 % IV SOLN
INTRAVENOUS | Status: DC
  Administered 2018-11-12 – 2018-11-16 (×10): via INTRAVENOUS

## 2018-11-12 MED ORDER — GENTAMICIN SULFATE 40 MG/ML IJ SOLN
5.0000 mg/kg | INTRAVENOUS | Status: AC
  Administered 2018-11-12: 380 mg via INTRAVENOUS
  Filled 2018-11-12: qty 9.5

## 2018-11-12 MED ORDER — ACETAMINOPHEN 650 MG RE SUPP: 650.0000 mg | Freq: Four times a day (QID) | RECTAL | Status: DC | PRN

## 2018-11-12 MED ORDER — BISACODYL 5 MG PO TBEC: 5.0000 mg | DELAYED_RELEASE_TABLET | Freq: Every day | ORAL | Status: DC | PRN

## 2018-11-12 MED ORDER — ONDANSETRON 4 MG PO TBDP
4.0000 mg | ORAL_TABLET | Freq: Once | ORAL | Status: DC
  Filled 2018-11-12: qty 1

## 2018-11-12 MED ORDER — FENTANYL CITRATE (PF) 100 MCG/2ML IJ SOLN
25.0000 ug | INTRAMUSCULAR | Status: AC | PRN
  Administered 2018-11-12 (×6): 25 ug via INTRAVENOUS

## 2018-11-12 MED ORDER — SODIUM CHLORIDE 0.9 % IV SOLN
INTRAVENOUS | Status: DC
  Administered 2018-11-12 (×3): via INTRAVENOUS

## 2018-11-12 MED ORDER — ACETAMINOPHEN 325 MG PO TABS
650.0000 mg | ORAL_TABLET | Freq: Four times a day (QID) | ORAL | Status: DC | PRN
  Administered 2018-11-12: 650 mg via ORAL
  Filled 2018-11-12: qty 2

## 2018-11-12 MED ORDER — SODIUM CHLORIDE 0.9 % IV SOLN
500.0000 mg | Freq: Three times a day (TID) | INTRAVENOUS | Status: DC
  Administered 2018-11-12 – 2018-11-15 (×8): 500 mg via INTRAVENOUS
  Filled 2018-11-12: qty 500
  Filled 2018-11-12 (×2): qty 0.5
  Filled 2018-11-12 (×7): qty 500
  Filled 2018-11-12: qty 0.5

## 2018-11-12 MED ORDER — SODIUM CHLORIDE 0.9 % IV SOLN
1.0000 g | INTRAVENOUS | Status: AC
  Administered 2018-11-12: 1 g via INTRAVENOUS
  Filled 2018-11-12: qty 1000

## 2018-11-12 MED ORDER — PROPOFOL 10 MG/ML IV BOLUS
INTRAVENOUS | Status: DC | PRN
  Administered 2018-11-12: 50 mg via INTRAVENOUS
  Administered 2018-11-12: 30 mg via INTRAVENOUS
  Administered 2018-11-12: 70 mg via INTRAVENOUS

## 2018-11-12 MED ORDER — ONDANSETRON HCL 4 MG/2ML IJ SOLN
INTRAMUSCULAR | Status: DC | PRN
  Administered 2018-11-12: 4 mg via INTRAVENOUS

## 2018-11-12 MED ORDER — METOPROLOL TARTRATE 5 MG/5ML IV SOLN
5.0000 mg | Freq: Once | INTRAVENOUS | Status: AC
  Administered 2018-11-12: 5 mg via INTRAVENOUS

## 2018-11-12 MED ORDER — FENTANYL CITRATE (PF) 100 MCG/2ML IJ SOLN
INTRAMUSCULAR | Status: AC
  Filled 2018-11-12: qty 2

## 2018-11-12 MED ORDER — ONDANSETRON HCL 4 MG PO TABS: 4.0000 mg | ORAL_TABLET | Freq: Four times a day (QID) | ORAL | Status: DC | PRN

## 2018-11-12 MED ORDER — IBUPROFEN 400 MG PO TABS
400.0000 mg | ORAL_TABLET | Freq: Once | ORAL | Status: AC
  Administered 2018-11-12: 400 mg via ORAL
  Filled 2018-11-12: qty 1

## 2018-11-12 MED ORDER — METOPROLOL TARTRATE 5 MG/5ML IV SOLN
5.0000 mg | Freq: Once | INTRAVENOUS | Status: AC
  Administered 2018-11-12: 5 mg via INTRAVENOUS
  Filled 2018-11-12: qty 5

## 2018-11-12 MED ORDER — ONDANSETRON HCL 4 MG/2ML IJ SOLN
4.0000 mg | Freq: Once | INTRAMUSCULAR | Status: AC
  Administered 2018-11-12: 4 mg via INTRAVENOUS

## 2018-11-12 SURGICAL SUPPLY — 26 items
BAG DRAIN CYSTO-URO LG1000N (MISCELLANEOUS) ×3 IMPLANT
BAG URINE LEG 25OZ (MISCELLANEOUS) ×2 IMPLANT
BRUSH SCRUB EZ  4% CHG (MISCELLANEOUS) ×4
BRUSH SCRUB EZ 4% CHG (MISCELLANEOUS) IMPLANT
CATH COUDE FOLEY 2W 5CC 16FR (CATHETERS) ×2 IMPLANT
CATH FOL LEG HOLDER (MISCELLANEOUS) ×2 IMPLANT
CATH URETL 5X70 OPEN END (CATHETERS) ×3 IMPLANT
CONRAY 43 FOR UROLOGY 50M (MISCELLANEOUS) ×1 IMPLANT
COVER WAND RF STERILE (DRAPES) ×1 IMPLANT
GLOVE BIO SURGEON STRL SZ 6.5 (GLOVE) ×3 IMPLANT
GLOVE BIO SURGEONS STRL SZ 6.5 (GLOVE) ×2
GOWN STRL REUS W/ TWL LRG LVL3 (GOWN DISPOSABLE) ×2 IMPLANT
GOWN STRL REUS W/TWL LRG LVL3 (GOWN DISPOSABLE) ×4
KIT TURNOVER CYSTO (KITS) ×3 IMPLANT
PACK CYSTO AR (MISCELLANEOUS) ×3 IMPLANT
SENSORWIRE 0.038 NOT ANGLED (WIRE) ×3
SET CYSTO W/LG BORE CLAMP LF (SET/KITS/TRAYS/PACK) ×3 IMPLANT
SOL .9 NS 3000ML IRR  AL (IV SOLUTION) ×2
SOL .9 NS 3000ML IRR UROMATIC (IV SOLUTION) ×1 IMPLANT
STENT URET 6FRX24 CONTOUR (STENTS) IMPLANT
STENT URET 6FRX26 CONTOUR (STENTS) IMPLANT
STENT URO INLAY 6FRX26CM (STENTS) ×4 IMPLANT
SURGILUBE 2OZ TUBE FLIPTOP (MISCELLANEOUS) ×3 IMPLANT
SYRINGE IRR TOOMEY STRL 70CC (SYRINGE) ×3 IMPLANT
WATER STERILE IRR 1000ML POUR (IV SOLUTION) ×3 IMPLANT
WIRE SENSOR 0.038 NOT ANGLED (WIRE) ×1 IMPLANT

## 2018-11-12 NOTE — H&P (Signed)
[facility] NAME: Vincent Poole    MR#:  [id number]  DATE OF BIRTH:  Aug 09, 1935  DATE OF ADMISSION:  11/12/2018  PRIMARY CARE PHYSICIAN: [name], MD   REQUESTING/REFERRING PHYSICIAN: Dr. [name]  CHIEF COMPLAINT:  No chief complaint on file. Fever  HISTORY OF PRESENT ILLNESS:  Vincent Poole  is a 84 y.o. male with a known history of bladder cancer, bilateral ureteral obstruction, had a cystoscopy with stent exchange by Dr. Erlene Quan today, postoperatively patient developed fever, tachycardia, concerning this we are scheduling the patient for possible transient bacteremia.  Patient is very tachycardic with heart rate up to 130 bpm, slightly hypotensive, received 1 dose of metoprolol 2.5 IV push, dose of gentamicin IV, about 1500 mL IV fluids so far.  Patient denies any abdominal pain, nausea, vomiting.  No chest pain or shortness of breath. PAST MEDICAL HISTORY:   Past Medical History:  Diagnosis Date  . Anxiety   . Arthritis   . Bladder cancer ([place])   . Dysrhythmia 07/2018   history of atrial flutter that worsens with anxiety  . Femur fracture, right ([place]) 05/01/2018  . GERD (gastroesophageal reflux disease)   . History of recent blood transfusion 05/2018  . Hypertension   . Iron deficiency anemia 09/14/2018  . Prostate cancer ([place]) 07/2018   cancer growing in prostate but not prostate cancer, it is from the bladder  . Umbilical hernia 37/1696  . Urinary retention 2019   foley catheter place 11/2017  . UTI (urinary tract infection) 2019   frequent UTI's over last year  . Wound eschar of foot 07/2018   left heal getting wrapped and requiring antibiotic cream. cracks open with weight bearing.    PAST SURGICAL HISTOIRY:   Past Surgical History:  Procedure Laterality Date  . CARPAL TUNNEL RELEASE Right   . CHOLECYSTECTOMY  2004  . CYSTOGRAM  07/18/2018   Procedure: CYSTOGRAM;  Surgeon: [name], MD;  Location: [facility];  Service: Urology;;  . Consuela Mimes W/ RETROGRADES Bilateral 07/18/2018   Procedure: CYSTOSCOPY WITH RETROGRADE PYELOGRAM;  Surgeon: [name], MD;  Location: [facility];  Service: Urology;  Laterality: Bilateral;  . CYSTOSCOPY W/ URETERAL STENT PLACEMENT Bilateral 12/27/2017   Procedure: CYSTOSCOPY WITH RETROGRADE PYELOGRAM/URETERAL STENT PLACEMENT;  Surgeon: [name], MD;  Location: [facility];  Service: Urology;  Laterality: Bilateral;  . CYSTOSCOPY W/ URETERAL STENT PLACEMENT Bilateral 07/18/2018   Procedure: CYSTOSCOPY WITH STENT REPLACEMENT (exchange);  Surgeon: [name], MD;  Location: [facility];  Service: Urology;  Laterality: Bilateral;  . INTRAMEDULLARY (IM) NAIL INTERTROCHANTERIC Right 05/02/2018   Procedure: INTRAMEDULLARY (IM) NAIL INTERTROCHANTRIC;  Surgeon: [name], MD;  Location: [facility];  Service: Orthopedics;  Laterality: Right;  . LEG TENDON SURGERY Right 1958  . PORTA CATH INSERTION N/A 01/22/2018   Procedure: PORTA CATH INSERTION;  Surgeon: [name], MD;  Location: [facility];  Service: Cardiovascular;  Laterality: N/A;  . TRANSURETHRAL RESECTION OF BLADDER TUMOR N/A 12/27/2017   Procedure: TRANSURETHRAL RESECTION OF BLADDER TUMOR (TURBT);  Surgeon: [name], MD;  Location: [facility];  Service: Urology;  Laterality: N/A;  . TRANSURETHRAL RESECTION OF BLADDER TUMOR N/A 01/15/2018   Procedure: TRANSURETHRAL RESECTION OF BLADDER TUMOR (TURBT);  Surgeon: [name], MD;  Location: [facility];  Service: Urology;  Laterality: N/A;  Need 2 hrs for this case please  . TRANSURETHRAL RESECTION OF BLADDER TUMOR N/A 07/18/2018   Procedure: TRANSURETHRAL RESECTION  OF BLADDER TUMOR (TURBT);  Surgeon: [name], MD;  Location: [facility];  Service: Urology;  Laterality: N/A;    SOCIAL HISTORY:   Social History   Tobacco Use  . Smoking status: Never Smoker  . Smokeless tobacco: Never Used  Substance Use Topics   . Alcohol use: No    Alcohol/week: 0.0 standard drinks    FAMILY HISTORY:   Family History  Problem Relation Age of Onset  . Cancer Mother   . Chronic Renal Failure Mother   . Heart disease Father     DRUG ALLERGIES:   Allergies  Allergen Reactions  . Penicillins Other (See Comments)    Caused nervousness as a child. Patient states he took as a teenager w/o problems Has patient had a PCN reaction causing immediate rash, facial/tongue/throat swelling, SOB or lightheadedness with hypotension: No Has patient had a PCN reaction causing severe rash involving mucus membranes or skin necrosis: No Has patient had a PCN reaction that required hospitalization: No Has patient had a PCN reaction occurring within the last 10 years: No If all of the above answers are "NO", then may proceed with    REVIEW OF SYSTEMS:  CONSTITUTIONAL: Fever.  EYES: No blurred or double vision.  EARS, NOSE, AND THROAT: No tinnitus or ear pain.  RESPIRATORY: No cough, shortness of breath, wheezing or hemoptysis.  CARDIOVASCULAR: No chest pain, orthopnea, edema.  GASTROINTESTINAL: No nausea, vomiting, diarrhea or abdominal pain.  GENITOURINARY: No dysuria, hematuria.  ENDOCRINE: No polyuria, nocturia, hematuria observed. HEMATOLOGY: No anemia, easy bruising or bleeding SKIN: No rash or lesion. MUSCULOSKELETAL: No joint pain or arthritis.   NEUROLOGIC: No tingling, numbness, weakness.  PSYCHIATRY: No anxiety or depression.   MEDICATIONS AT HOME:   Prior to Admission medications   Medication Sig Start Date End Date Taking? Authorizing Provider  acetaminophen (TYLENOL) 500 MG tablet Take 1,000 mg by mouth every 6 (six) hours as needed for moderate pain or headache.    Yes [provider]  cetirizine (ZYRTEC) 10 MG tablet Take 10 mg by mouth daily as needed for allergies.    Yes [provider]  CRANBERRY PO Take 1 capsule by mouth 2 (two) times daily.    Yes [provider]   ferrous sulfate 325 (65 FE) MG tablet Take 1 tablet (325 mg total) by mouth 2 (two) times daily with a meal. 05/06/18  Yes Gouru, Aruna, MD  Multiple Vitamin (MULTIVITAMIN WITH MINERALS) TABS tablet Take 1 tablet by mouth daily.   Yes [provider]  MYRBETRIQ 50 MG TB24 tablet TAKE 1 TABLET DAILY 07/29/18  Yes McGowan, Larene Beach A, PA-C  nystatin [facility]) powder Apply topically 2 (two) times daily. 09/28/18  Yes McGowan, Larene Beach A, PA-C  polyethylene glycol powder (MIRALAX) powder Take 17 g by mouth daily as needed. Can increase to 3 times a day as needed for constipation but hold medication if has diarrhea 01/30/18  Yes Sindy Guadeloupe, MD  sulfamethoxazole-trimethoprim (BACTRIM DS,SEPTRA DS) 800-160 MG tablet Take 1 tablet by mouth every 12 (twelve) hours. 10/29/18  Yes McGowan, Larene Beach A, PA-C  vitamin B-12 (CYANOCOBALAMIN) 500 MCG tablet Take 500 mcg by mouth daily.   Yes [provider]  zinc oxide 20 % ointment Apply 1 application topically as needed for irritation.   Yes [provider]      VITAL SIGNS:  Blood pressure 90/65, pulse (!) 136, temperature (!) 102.8 F (39.3 C), resp. rate 12, height 5\' 11"  (1.803 m), weight 77.2 kg,  SpO2 90 %.  PHYSICAL EXAMINATION:  GENERAL:  84 y.o.-year-old patient lying in the bed with no acute distress.  Patient is shivering. EYES: Pupils equal, round, reactive to light and accommodation. No scleral icterus. Extraocular muscles intact.  HEENT: Head atraumatic, normocephalic. Oropharynx and nasopharynx clear.  NECK:  Supple, no jugular venous distention. No thyroid enlargement, no tenderness.  LUNGS: Normal breath sounds bilaterally, no wheezing, rales,rhonchi or crepitation. No use of accessory muscles of respiration.  CARDIOVASCULAR: S1, S2 normal. No murmurs, rubs, or gallops.  ABDOMEN: Soft, nontender, nondistended. Bowel sounds present. No organomegaly or mass.  EXTREMITIES: No pedal edema, cyanosis, or clubbing.   NEUROLOGIC: Cranial nerves II through XII are intact. Muscle strength 5/5 in all extremities. Sensation intact. Gait not checked.  PSYCHIATRIC: The patient is alert and oriented x 3.  SKIN: No obvious rash, lesion, or ulcer.   LABORATORY PANEL:   CBC No results for input(s): WBC, HGB, HCT, PLT in the last 168 hours. ------------------------------------------------------------------------------------------------------------------  Chemistries  No results for input(s): NA, K, CL, CO2, GLUCOSE, BUN, CREATININE, CALCIUM, MG, AST, ALT, ALKPHOS, BILITOT in the last 168 hours.  Invalid input(s): GFRCGP ------------------------------------------------------------------------------------------------------------------  Cardiac Enzymes No results for input(s): TROPONINI in the last 168 hours. ------------------------------------------------------------------------------------------------------------------  RADIOLOGY:  No results found.  EKG:   Orders placed or performed during the hospital encounter of 11/12/18  . EKG 12-Lead  . EKG 12-Lead    IMPRESSION AND PLAN:   84 year old male patient with history of prostate cancer, recurrent UTIs, urine retention seen by Dr. 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 Erlene Quan developed fever up to 100.8, tachycardic with heart rate around 130 bpm, followed by Dr. Erlene Quan for overnight admission for possible transient bacteremia.   #1.possible sepsis, transient bacteremia patient still has fever, tachycardia, received 1 dose of gentamicin, patient is admitted to telemetry, start IV fluids, IV antibiotics, start with meropenem today, follow blood cultures, patient has recurrent UTIs, possible colonization as per urology, continue Foley catheter, supportive  treatment, appreciate urology following.  Check lactic acid, procalcitonin levels. Patient had cystoscopy, stent exchange today, fever likely due to instrumentation 2.  History of prostate, high-grade urothelial cancer, prognosis poor. 3.  Essential hypertension, hypotensive today.  Continue aggressive hydration, hold antihypertensives. Prognosis guarded, spoke with patient's wife about patient being admitted overnight.  All the records are reviewed and case discussed with ED provider. Management plans discussed with the patient, family and they are in agreement.  CODE STATUS: Full code  TOTAL TIME TAKING CARE OF THIS PATIENT: 55 minutes.    Epifanio Lesches M.D on 11/12/2018 at 6:50 PM  Between 7am to 6pm - Pager - [phone]  After 6pm go to [email or web address] - password [email or web address] [facility]  Office  [phone]  CC: Primary care physician; [name], MD  Note: This dictation was prepared with Dragon dictation along with smaller phrase technology. Any transcriptional errors that result from this process are unintentional.

## 2018-11-12 NOTE — Progress Notes (Signed)
Called by PACU, notified of  Fever to 102.8, elevated HR to 130s (EKG ordered), normotensive.    Foley draining after irrigation but continues to leak around it (small capacity bladder).  Blood cultures ordered, discussed case with Dr. Vianne Bulls who will see and evaluate patient.    He did have +UCx E. Coli preop treated with Bactrim x 2 weeks as well as received IV amp/ gent (24 hour dosing) intraop.    Suspect transient bacteremia in the setting of chronic colonization.  Supportive care.  Continue Foley and flush prn,    Appreciate medical obs admission.  Urology will continue to follow.    [name], MD

## 2018-11-12 NOTE — OR Nursing (Signed)
Received back in PACU to monitor  heart rate,  tachy again.  Metoprolol given.

## 2018-11-12 NOTE — Anesthesia Preprocedure Evaluation (Addendum)
Anesthesia Evaluation  Patient identified by MRN, date of birth, ID band Patient awake    Reviewed: Allergy & Precautions, H&P , NPO status , Patient's Chart, lab work & pertinent test results  Airway Mallampati: III  TM Distance: >3 FB     Dental  (+) Edentulous Upper, Missing One tooth:   Pulmonary neg pulmonary ROS, neg shortness of breath, neg COPD, neg recent URI,           Cardiovascular hypertension, (-) angina+ dysrhythmias Atrial Fibrillation      Neuro/Psych PSYCHIATRIC DISORDERS Anxiety negative neurological ROS     GI/Hepatic Neg liver ROS, GERD  Controlled,  Endo/Other  negative endocrine ROS  Renal/GU CRFRenal disease     Musculoskeletal   Abdominal   Peds  Hematology  (+) Blood dyscrasia, anemia ,   Anesthesia Other Findings Past Medical History: No date: Anxiety No date: Arthritis No date: Bladder cancer ([place]) 07/2018: Dysrhythmia     Comment:  history of atrial flutter that worsens with anxiety 05/01/2018: Femur fracture, right ([place]) No date: GERD (gastroesophageal reflux disease) 05/2018: History of recent blood transfusion No date: Hypertension 09/14/2018: Iron deficiency anemia 07/2018: Prostate cancer ([place])     Comment:  cancer growing in prostate but not prostate cancer, it               is from the bladder 07/6044: Umbilical hernia 4098: Urinary retention     Comment:  foley catheter place 11/2017 2019: UTI (urinary tract infection)     Comment:  frequent UTI's over last year 07/2018: Wound eschar of foot     Comment:  left heal getting wrapped and requiring antibiotic               cream. cracks open with weight bearing.  Past Surgical History: No date: CARPAL TUNNEL RELEASE; Right 2004: CHOLECYSTECTOMY 07/18/2018: CYSTOGRAM     Comment:  Procedure: CYSTOGRAM;  Surgeon: [name], MD;                Location: [facility];  Service: Urology;; 07/18/2018: Consuela Mimes W/  RETROGRADES; Bilateral     Comment:  Procedure: CYSTOSCOPY WITH RETROGRADE PYELOGRAM;                Surgeon: [name], MD;  Location: [facility];                Service: Urology;  Laterality: Bilateral; 12/27/2017: CYSTOSCOPY W/ URETERAL STENT PLACEMENT; Bilateral     Comment:  Procedure: CYSTOSCOPY WITH RETROGRADE PYELOGRAM/URETERAL              STENT PLACEMENT;  Surgeon: [name], MD;                Location: [facility];  Service: Urology;  Laterality:               Bilateral; 07/18/2018: CYSTOSCOPY W/ URETERAL STENT PLACEMENT; Bilateral     Comment:  Procedure: CYSTOSCOPY WITH STENT REPLACEMENT (exchange);              Surgeon: [name], MD;  Location: [facility];                Service: Urology;  Laterality: Bilateral; 05/02/2018: INTRAMEDULLARY (IM) NAIL INTERTROCHANTERIC; Right     Comment:  Procedure: INTRAMEDULLARY (IM) NAIL INTERTROCHANTRIC;                Surgeon: [name], MD;  Location: [facility];  Service: Orthopedics;  Laterality: Right; 1958: LEG TENDON SURGERY; Right 01/22/2018: PORTA CATH INSERTION; N/A     Comment:  Procedure: PORTA CATH INSERTION;  Surgeon: [name],              MD;  Location: [facility];  Service:               Cardiovascular;  Laterality: N/A; 12/27/2017: TRANSURETHRAL RESECTION OF BLADDER TUMOR; N/A     Comment:  Procedure: TRANSURETHRAL RESECTION OF BLADDER TUMOR               (TURBT);  Surgeon: [name], MD;  Location: [facility]               [facility];  Service: Urology;  Laterality: N/A; 01/15/2018: TRANSURETHRAL RESECTION OF BLADDER TUMOR; N/A     Comment:  Procedure: TRANSURETHRAL RESECTION OF BLADDER TUMOR               (TURBT);  Surgeon: [name], MD;  Location: [facility]               [facility];  Service: Urology;  Laterality: N/A;  Need 2 hrs for              this case please 07/18/2018: TRANSURETHRAL RESECTION OF BLADDER TUMOR; N/A     Comment:  Procedure: TRANSURETHRAL RESECTION OF BLADDER TUMOR                (TURBT);  Surgeon: [name], MD;  Location: [facility]               [facility];  Service: Urology;  Laterality: N/A;  BMI    Body Mass Index:  23.74 kg/m      Reproductive/Obstetrics negative OB ROS                            Anesthesia Physical Anesthesia Plan  ASA: III  Anesthesia Plan: General LMA   Post-op Pain Management:    Induction:   PONV Risk Score and Plan: Dexamethasone, Ondansetron and Treatment may vary due to age or medical condition  Airway Management Planned:   Additional Equipment:   Intra-op Plan:   Post-operative Plan:   Informed Consent: I have reviewed the patients History and Physical, chart, labs and discussed the procedure including the risks, benefits and alternatives for the proposed anesthesia with the patient or authorized representative who has indicated his/her understanding and acceptance.   Dental Advisory Given  Plan Discussed with: Anesthesiologist, CRNA and Surgeon  Anesthesia Plan Comments:        Anesthesia Quick Evaluation

## 2018-11-12 NOTE — OR Nursing (Signed)
Patient's HR elevated.  Reported to Dr. Amie Critchley, metoprolol ordered and given.

## 2018-11-12 NOTE — OR Nursing (Signed)
Patient is still leaking urine from around his catheter.  Irrigated catheter again, some resistance noted.  One medium clot removed.  Medicated.  Paged Dr. Erlene Quan again.

## 2018-11-12 NOTE — Transfer of Care (Signed)
Immediate Anesthesia Transfer of Care Note  Patient: Vincent Poole  Procedure(s) Performed: CYSTOSCOPY WITH STENT Exchange (Bilateral )  Patient Location: PACU  Anesthesia Type:General  Level of Consciousness: awake  Airway & Oxygen Therapy: Patient Spontanous Breathing  Post-op Assessment: Report given to RN  Post vital signs: stable  Last Vitals:  Vitals Value Taken Time  BP 122/63 11/12/2018  2:28 PM  Temp    Pulse 69 11/12/2018  2:31 PM  Resp 20 11/12/2018  2:31 PM  SpO2 98 % 11/12/2018  2:31 PM  Vitals shown include unvalidated device data.  Last Pain:  Vitals:   11/12/18 1106  TempSrc: Temporal  PainSc: 0-No pain         Complications: No apparent anesthesia complications

## 2018-11-12 NOTE — Interval H&P Note (Signed)
History and Physical Interval Note:  11/12/2018 1:21 PM  Vincent Poole  has presented today for surgery, with the diagnosis of bladder cancer, bilateral ureteral obstruction  The various methods of treatment have been discussed with the patient and family. After consideration of risks, benefits and other options for treatment, the patient has consented to  Procedure(s): CYSTOSCOPY WITH STENT Exchange (Bilateral) as a surgical intervention .  The patient's history has been reviewed, patient examined, no change in status, stable for surgery.  I have reviewed the patient's chart and labs.  Questions were answered to the patient's satisfaction.    RRR CTAB  [name]

## 2018-11-12 NOTE — Op Note (Signed)
Date of procedure: 11/12/18  Preoperative diagnosis:  1. Bladder cancer 2. Bilateral ureteral obstruction  Postoperative diagnosis:  1. Same as above  Procedure: 1. Cystoscopy 2. Bilateral ureteral stent exchange 3. Cystogram  Surgeon: [name], MD  Anesthesia: General  Complications: None  Intraoperative findings: Debris-filled bladder. No obvious tumor and bladder but does have shaggy necrotic appearance especially at the bladder neck.  Prostatic fossa is grossly abnormal, necrotic and irregular without any papillary change.  Mild stent encrustation.  Catheter placement confirmed with cystogram, small capacity bladder.   EBL: Minimal  Specimens: noone  Drains: 6 x 26 French double-J ureteral stent, Bard optima bilaterally, 16 Fr coude Foley  Indication: Vincent Poole is a 84 y.o. patient with muscle invasive bladder cancer he is not a surgical candidate who underwent tri-modal therapy who returns today for replacement of his bilateral ureteral stent as well as further diagnostic evaluation.  After reviewing the management options for treatment, he elected to proceed with the above surgical procedure(s). We have discussed the potential benefits and risks of the procedure, side effects of the proposed treatment, the likelihood of the patient achieving the goals of the procedure, and any potential problems that might occur during the procedure or recuperation. Informed consent has been obtained.  Description of procedure:  The patient was taken to the operating room and general anesthesia was induced.  The patient was placed in the dorsal lithotomy position, prepped and draped in the usual sterile fashion, and preoperative antibiotics were administered. A preoperative time-out was performed.  His previously placed Foley catheter was removed prior to prepping.  Upon advancing the scope into the bladder, the prostatic fossa was noted to be irregular and shaggy.   The bladder was full of debris.  Was irrigated several times until he was able to be cleared for better visualization.  Bilateral ureteral stents were seen in place with mild degree of encrustation.  Evaluation of the bladder revealed no obvious papillary tumor.  There is a shaggy somewhat necrotic appearance of the bladder neck in addition to the prostatic fossa, unchanged from previous cystoscopy.  At this point time, graspers were used to grasp the distal coil of the left ureteral stent and pulled the lower level the urethral meatus.  The stent was then completely withdrawn to the level of the urethral meatus and cannulated with a sensor wire.  The sensor wire was then backloaded over the rigid cystoscope. A 6 x 26 French Bard Optima double-J ureteral stent was then advanced over the wire up to level the kidney.  The wires partially drawn until focal is noted both within the bladder as well as within the renal pelvis.  Attention was then turned to the right kidney.  The same exact procedure was used to place a ureteralBard Optima 6 x 26 French double-J ureteral stent in satisfactory position on the right.   I then replaced his Foley catheter with an 84 French coud catheter with 12 cc in the balloon.  The catheter advanced easily but upon irrigating the catheter, there was leakage around the catheter concerning for malpositioning of the catheter.  I then performed a cystogram by injecting approximately 50 cc through the catheter at which time again leakage was noted around the catheter again but the bladder did feel slightly confirming the appropriate position of the bladder.  Notably, the bladder was quite contracted and fairly small capacity.  Patient was then clean and dry, repositioned in supine position, reversed anesthesia, taken the PACU  in stable condition.  Plan: I will see the patient again in 4 months to discuss his next ureteral stent exchange.    [name], MD

## 2018-11-12 NOTE — OR Nursing (Signed)
To postop with tremors, states "I got myself worked up and I shake".  Dr. Amie Critchley in to see pt re HR.  Pt vomited clear green emesis.  Zofran ordered and will be sent from pharmacy.  Cardiopulmonary in for EKG @1725 .  IV #22g left hand started as came out in PACU per report.  Patient taken to PACU for further monitoring and care.

## 2018-11-12 NOTE — Progress Notes (Addendum)
Pharmacy Antibiotic Note  Vincent Poole is a 84 y.o. male admitted on 11/12/2018 with sepsis.  Pharmacy has been consulted for meropenem dosing.  Plan: Will start meropenem 500 mg q8H.  Height: 5\' 11"  (180.3 cm) Weight: 170 lb 3.1 oz (77.2 kg) IBW/kg (Calculated) : 75.3  Temp (24hrs), Avg:99.5 F (37.5 C), Min:97.6 F (36.4 C), Max:102.8 F (39.3 C)  No results for input(s): WBC, CREATININE, LATICACIDVEN, VANCOTROUGH, VANCOPEAK, VANCORANDOM, GENTTROUGH, GENTPEAK, GENTRANDOM, TOBRATROUGH, TOBRAPEAK, TOBRARND, AMIKACINPEAK, AMIKACINTROU, AMIKACIN in the last 168 hours.  Estimated Creatinine Clearance: 41.4 mL/min (A) (by C-G formula based on SCr of 1.49 mg/dL (H)).    Allergies  Allergen Reactions  . Penicillins Other (See Comments)    Caused nervousness as a child. Patient states he took as a teenager w/o problems Has patient had a PCN reaction causing immediate rash, facial/tongue/throat swelling, SOB or lightheadedness with hypotension: No Has patient had a PCN reaction causing severe rash involving mucus membranes or skin necrosis: No Has patient had a PCN reaction that required hospitalization: No Has patient had a PCN reaction occurring within the last 10 years: No If all of the above answers are "NO", then may proceed with    Antimicrobials this admission: 1/6 ampicillin x 1 1/6 gentamicin x 1  1/6 meropenem >>   Dose adjustments this admission: Meropenem was renally adjusted.   Microbiology results: None  Thank you for allowing pharmacy to be a part of this patient's care.  Oswald Hillock, PhamD, BCPS Clinical Pharmacist 11/12/2018 6:54 PM

## 2018-11-12 NOTE — OR Nursing (Signed)
Foley catheter leaking from around catheter insertion site, bed saturated with clear red tinged urine.  Reported to Dr. Erlene Quan.  Advised to irrigate with NS solution.  Some resistance noted, a few small clots removed. Monitoring.

## 2018-11-12 NOTE — Anesthesia Post-op Follow-up Note (Signed)
Anesthesia QCDR form completed.        

## 2018-11-12 NOTE — OR Nursing (Signed)
Dr. Amie Critchley here to check on patient.  OK to go to phase II.

## 2018-11-12 NOTE — Discharge Instructions (Addendum)
You have a ureteral stent in place.  This is a tube that extends from your kidney to your bladder.  This may cause urinary bleeding, burning with urination, and urinary frequency.  Please call our office or present to the ED if you develop fevers >101 or pain which is not able to be controlled with oral pain medications.    [facility] [address], [place], [place] [address] [phone]    AMBULATORY SURGERY  DISCHARGE INSTRUCTIONS   1) The drugs that you were given will stay in your system until tomorrow so for the next 24 hours you should not:  A) Drive an automobile B) Make any legal decisions C) Drink any alcoholic beverage   2) You may resume regular meals tomorrow.  Today it is better to start with liquids and gradually work up to solid foods.  You may eat anything you prefer, but it is better to start with liquids, then soup and crackers, and gradually work up to solid foods.   3) Please notify your doctor immediately if you have any unusual bleeding, trouble breathing, redness and pain at the surgery site, drainage, fever, or pain not relieved by medication.    4) Additional Instructions:        Please contact your physician with any problems or Same Day Surgery at [phone], Monday through Friday 6 am to 4 pm, or [place] [facility] number at [phone].

## 2018-11-12 NOTE — OR Nursing (Signed)
Spoke with Dr. Erlene Quan again.  Stated has very small bladder and that when he has bladder spasms it will leak from around the catheter coming out of his penis.  As long as can irrigate freely and have a return, it is OK to send patient home.

## 2018-11-12 NOTE — Progress Notes (Signed)
hospitalist consult ordered HR 137 , temp 102.8 , labs, and blood cultures drawn .

## 2018-11-12 NOTE — Progress Notes (Signed)
hospitalist consult complete , pt. To be admitted overnight .

## 2018-11-12 NOTE — OR Nursing (Signed)
Went to disconnect monitoring equipment from patient, found IV pulled out.  Then vomited bile fluid.  States feels better.  Wants to go see wife.  To Phase II.

## 2018-11-12 NOTE — Anesthesia Procedure Notes (Signed)
Procedure Name: LMA Insertion Date/Time: 11/12/2018 1:38 PM Performed by: Leander Rams, CRNA Pre-anesthesia Checklist: Patient identified, Emergency Drugs available, Suction available, Patient being monitored and Timeout performed Patient Re-evaluated:Patient Re-evaluated prior to induction Oxygen Delivery Method: Circle system utilized Preoxygenation: Pre-oxygenation with 100% oxygen Induction Type: IV induction LMA: LMA inserted LMA Size: 4.0 Number of attempts: 1 Placement Confirmation: positive ETCO2,  CO2 detector and breath sounds checked- equal and bilateral Tube secured with: Tape

## 2018-11-13 ENCOUNTER — Encounter: Payer: Self-pay | Admitting: Urology

## 2018-11-13 DIAGNOSIS — A419 Sepsis, unspecified organism: Secondary | ICD-10-CM

## 2018-11-13 LAB — BASIC METABOLIC PANEL
Anion gap: 7 (ref 5–15)
BUN: 23 mg/dL (ref 8–23)
CHLORIDE: 107 mmol/L (ref 98–111)
CO2: 21 mmol/L — ABNORMAL LOW (ref 22–32)
CREATININE: 1.73 mg/dL — AB (ref 0.61–1.24)
Calcium: 8.3 mg/dL — ABNORMAL LOW (ref 8.9–10.3)
GFR calc Af Amer: 42 mL/min — ABNORMAL LOW (ref >60)
GFR calc non Af Amer: 36 mL/min — ABNORMAL LOW (ref >60)
Glucose, Bld: 99 mg/dL (ref 70–99)
Potassium: 4.9 mmol/L (ref 3.5–5.1)
Sodium: 135 mmol/L (ref 135–145)

## 2018-11-13 LAB — LACTIC ACID, PLASMA: Lactic Acid, Venous: 1.9 mmol/L (ref 0.5–1.9)

## 2018-11-13 LAB — GLUCOSE, CAPILLARY: Glucose-Capillary: 83 mg/dL (ref 70–99)

## 2018-11-13 LAB — CBC
HCT: 32.8 % — ABNORMAL LOW (ref 39.0–52.0)
Hemoglobin: 10.3 g/dL — ABNORMAL LOW (ref 13.0–17.0)
MCH: 30 pg (ref 26.0–34.0)
MCHC: 31.4 g/dL (ref 30.0–36.0)
MCV: 95.6 fL (ref 80.0–100.0)
Platelets: 176 10*3/uL (ref 150–400)
RBC: 3.43 MIL/uL — ABNORMAL LOW (ref 4.22–5.81)
RDW: 17.7 % — ABNORMAL HIGH (ref 11.5–15.5)
WBC: 26.3 10*3/uL — ABNORMAL HIGH (ref 4.0–10.5)
nRBC: 0.1 % (ref 0.0–0.2)

## 2018-11-13 LAB — PROCALCITONIN: Procalcitonin: 134.93 ng/mL

## 2018-11-13 MED ORDER — SODIUM CHLORIDE 0.9 % IV BOLUS
1000.0000 mL | Freq: Once | INTRAVENOUS | Status: AC
  Administered 2018-11-13: 1000 mL via INTRAVENOUS

## 2018-11-13 NOTE — Progress Notes (Signed)
Advanced Care Plan.  Purpose of Encounter: CODE STATUS. Parties in Attendance: The patient, his wife and me. Patient's Decisional Capacity: Yes. Medical Story: Vincent Poole  is a 84 y.o. male with a known history of bladder cancer, bilateral ureteral obstruction, recurrent UTI hypertension, anemia etc.  The patient is admitted for septic shock due to UTI and possible bacteremia, acute renal failure due to dehydration.  I discussed with the patient and his wife about his current condition, poor prognosis, CODE STATUS and palliative care.  The patient wants her wife to decide.  His wife stated that the patient wants to be resuscitated and intubated if he has a cardiopulmonary arrest.   Goals of Care Determinations: Palliative care. Plan:  Code Status: Full code. Time spent discussing advance care planning: 18 minutes.

## 2018-11-13 NOTE — Progress Notes (Addendum)
[facility] NAME: Vincent Poole    MR#:  [id number]  DATE OF BIRTH:  08-21-1935  SUBJECTIVE:  Patient is lethargic, wife at the bedside, advanced directives discussed-patient is full code  REVIEW OF SYSTEMS:  Review of Systems  Constitutional: Positive for malaise/fatigue. Negative for chills and fever.  HENT: Positive for hearing loss. Negative for sore throat.   Eyes: Negative for blurred vision and double vision.  Respiratory: Negative for cough, hemoptysis, shortness of breath, wheezing and stridor.   Cardiovascular: Negative for chest pain, palpitations, orthopnea and leg swelling.  Gastrointestinal: Negative for abdominal pain, blood in stool, diarrhea, melena, nausea and vomiting.  Genitourinary: Negative for dysuria, flank pain and hematuria.  Musculoskeletal: Negative for back pain and joint pain.  Skin: Negative for rash.  Neurological: Negative for dizziness, sensory change, focal weakness, seizures, loss of consciousness, weakness and headaches.  Endo/Heme/Allergies: Negative for polydipsia.  Psychiatric/Behavioral: Negative for depression. The patient is not nervous/anxious.     DRUG ALLERGIES:  No Known Allergies VITALS:  Blood pressure (!) 95/54, pulse 87, temperature 97.7 F (36.5 C), resp. rate 18, height 5\' 11"  (1.803 m), weight 77.8 kg, SpO2 100 %. PHYSICAL EXAMINATION:  Physical Exam Constitutional:      General: He is not in acute distress. HENT:     Head: Normocephalic.     Mouth/Throat:     Mouth: Mucous membranes are moist.  Eyes:     General: No scleral icterus.    Conjunctiva/sclera: Conjunctivae normal.     Pupils: Pupils are equal, round, and reactive to light.  Neck:     Musculoskeletal: Normal range of motion and neck supple.     Vascular: No JVD.     Trachea: No tracheal deviation.  Cardiovascular:     Rate and Rhythm: Normal rate and regular rhythm.     Heart sounds: Normal heart  sounds. No murmur. No gallop.   Pulmonary:     Effort: Pulmonary effort is normal. No respiratory distress.     Breath sounds: Normal breath sounds. No wheezing or rales.  Abdominal:     General: Bowel sounds are normal. There is no distension.     Palpations: Abdomen is soft.     Tenderness: There is no abdominal tenderness. There is no rebound.  Genitourinary:    Comments: Chronic Foley in situ. Musculoskeletal: Normal range of motion.        General: No tenderness.     Right lower leg: No edema.     Left lower leg: No edema.  Skin:    Findings: No erythema or rash.  Neurological:     General: No focal deficit present.     Mental Status: He is alert and oriented to person, place, and time.     Cranial Nerves: No cranial nerve deficit.  Psychiatric:        Mood and Affect: Mood normal.    LABORATORY PANEL:  Male CBC Recent Labs  Lab 11/13/18 0622  WBC 26.3*  HGB 10.3*  HCT 32.8*  PLT 176   ------------------------------------------------------------------------------------------------------------------ Chemistries  Recent Labs  Lab 11/13/18 0622  NA 135  K 4.9  CL 107  CO2 21*  GLUCOSE 99  BUN 23  CREATININE 1.73*  CALCIUM 8.3*   RADIOLOGY:  Dg Chest Port 1 View  Result Date: 11/12/2018 CLINICAL DATA:  Shortness of breath and fever. Recent anesthesia for urologic procedure. EXAM: PORTABLE CHEST 1 VIEW COMPARISON:  05/01/2018; chest  CT-10/22/2018 FINDINGS: Grossly unchanged cardiac silhouette and mediastinal contours with atherosclerotic plaque within the thoracic aorta. Stable positioning of support apparatus. No focal airspace opacities. No pleural effusion or pneumothorax. No evidence of edema. No acute osseous abnormalities. Moderate to severe degenerative change of the bilateral glenohumeral joints, right greater than left, incompletely evaluated. IMPRESSION: No acute cardiopulmonary disease. Specifically, no evidence of pneumonia and/or aspiration.  Electronically Signed   By: Sandi Mariscal M.D.   On: 11/12/2018 19:21   ASSESSMENT AND PLAN:  84 year old male patient with history of prostate cancer, recurrent UTIs, urine retention seen by Dr. 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 Erlene Quan developed fever up to 100.8, tachycardic with heart rate around 130 bpm, followed by Dr. Erlene Quan for overnight admission for possible transient bacteremia.  *Acute septic shock  Resolved  Secondary to urinary etiology/complicated UTI due to chronic indwelling Foley Continue sepsis protocol, empiric meropenem, follow-up on outstanding cultures  *Acute complicated UTI secondary to chronic indwelling Foley  Plan of care as stated above Exacerbated by obstructive uropathy, status post ureteral stent exchange by urology  *Acute lactic acidosis Resolving with IV fluids for rehydration  *AKI on CKD stage III Resolved with IV fluid support   *History of bladder cancer/high-grade urothelial cancer prognosis poor Palliative care consulted  *Chronic benign essential hypertension  Stable  Antihypertensives on hold for relative hypotension on yesterday   *Chronic Iron deficiency anemia Stable  All the records are reviewed and case discussed with Care Management/Social Worker. Management plans discussed with the patient, his wife and they are in agreement.  CODE STATUS: Full Code  TOTAL TIME TAKING CARE OF THIS PATIENT: 28 minutes.   More than 50% of the time was spent in counseling/coordination of care: YES  POSSIBLE D/C IN 2-3 DAYS, DEPENDING ON CLINICAL CONDITION.   Demetrios Loll M.D on 11/13/2018 at 3:09 PM  Between 7am to 6pm - Pager - [phone]  After 6pm go to [email or web address] - [employer] [facility]

## 2018-11-13 NOTE — Progress Notes (Signed)
Dr. Bridgett Larsson notified that there was no improvement in BP after 1L bolus. Instructed to give another 1L bolus. Will continue to monitor.

## 2018-11-13 NOTE — Progress Notes (Signed)
Urology Consult Follow Up  Subjective: Admitted postop secondary to meeting sepsis criteria including fever, tachycardia and hypotension.  Tachycardia improved this morning but remains hypotensive, receiving 1 L bolus currently.  No further fever since PACU.  Urine in Foley clearing.  Wife at bedside this morning.  Anti-infectives: Anti-infectives (From admission, onward)   Start     Dose/Rate Route Frequency Ordered Stop   11/12/18 2200  meropenem (MERREM) 2 g in sodium chloride 0.9 % 100 mL IVPB  Status:  Discontinued     2 g 200 mL/hr over 30 Minutes Intravenous Every 8 hours 11/12/18 1858 11/12/18 2005   11/12/18 2200  meropenem (MERREM) 500 mg in sodium chloride 0.9 % 100 mL IVPB     500 mg 200 mL/hr over 30 Minutes Intravenous Every 8 hours 11/12/18 2005     11/12/18 0600  gentamicin (GARAMYCIN) 380 mg in dextrose 5 % 50 mL IVPB     5 mg/kg  75.3 kg (Ideal) 119 mL/hr over 30 Minutes Intravenous On call to O.R. 11/12/18 0039 11/12/18 1350   11/12/18 0038  ampicillin (OMNIPEN) 1 g in sodium chloride 0.9 % 100 mL IVPB     1 g 300 mL/hr over 20 Minutes Intravenous 30 min pre-op 11/12/18 0038 11/12/18 1343      Current Facility-Administered Medications  Medication Dose Route Frequency Provider Last Rate Last Dose  . 0.9 %  sodium chloride infusion   Intravenous Continuous Loney Hering D, MD 125 mL/hr at 11/13/18 0909    . acetaminophen (TYLENOL) tablet 650 mg  650 mg Oral Q6H PRN Epifanio Lesches, MD   650 mg at 11/12/18 1902   Or  . acetaminophen (TYLENOL) suppository 650 mg  650 mg Rectal Q6H PRN Epifanio Lesches, MD      . bisacodyl (DULCOLAX) EC tablet 5 mg  5 mg Oral Daily PRN Epifanio Lesches, MD      . docusate sodium (COLACE) capsule 100 mg  100 mg Oral BID Epifanio Lesches, MD   100 mg at 11/13/18 0906  . enoxaparin (LOVENOX) injection 40 mg  40 mg Subcutaneous Q24H Epifanio Lesches, MD   40 mg at 11/13/18 0800  . HYDROcodone-acetaminophen  (NORCO/VICODIN) 5-325 MG per tablet 1-2 tablet  1-2 tablet Oral Q4H PRN Epifanio Lesches, MD   2 tablet at 11/13/18 0138  . meropenem (MERREM) 500 mg in sodium chloride 0.9 % 100 mL IVPB  500 mg Intravenous Q8H Oswald Hillock, RPH 200 mL/hr at 11/13/18 0657 500 mg at 11/13/18 0657  . mirabegron ER (MYRBETRIQ) tablet 50 mg  50 mg Oral Daily Epifanio Lesches, MD   50 mg at 11/13/18 0906  . ondansetron (ZOFRAN) tablet 4 mg  4 mg Oral Q6H PRN Epifanio Lesches, MD       Or  . ondansetron (ZOFRAN) injection 4 mg  4 mg Intravenous Q6H PRN Epifanio Lesches, MD      . ondansetron (ZOFRAN-ODT) disintegrating tablet 4 mg  4 mg Oral Once Piscitello, Precious Haws, MD      . sodium chloride 0.9 % bolus 1,000 mL  1,000 mL Intravenous Once Demetrios Loll, MD      . traZODone (DESYREL) tablet 25 mg  25 mg Oral QHS PRN Epifanio Lesches, MD         Objective: Vital signs in last 24 hours: Temp:  [97.6 F (36.4 C)-102.8 F (39.3 C)] 97.7 F (36.5 C) (01/07 0750) Pulse Rate:  [62-146] 92 (01/07 0915) Resp:  [10-27] 18 (01/07 0750) BP: (81-168)/(46-119)  81/52 (01/07 0915) SpO2:  [90 %-100 %] 100 % (01/07 0750) Weight:  [77.2 kg-77.8 kg] 77.8 kg (01/07 0341)  Intake/Output from previous day: 01/06 0701 - 01/07 0700 In: 1670 [P.O.:120; I.V.:1550] Out: 350 [Urine:300; Emesis/NG output:50] Intake/Output this shift: Total I/O In: -  Out: 250 [Urine:250]   Physical Exam Vitals signs reviewed.  Constitutional:      Appearance: Normal appearance.     Comments: Wife at bedside.  Appears to be in normal mental state.  HENT:     Head: Normocephalic and atraumatic.  Genitourinary:    Penis: Normal.      Comments: Foley catheter draining slightly cloudy urine, blood-tinged Skin:    General: Skin is warm.  Neurological:     Mental Status: He is alert and oriented to person, place, and time.     Lab Results:  Recent Labs    11/12/18 2009 11/13/18 0622  WBC 16.9* 26.3*  HGB 11.3*  10.3*  HCT 36.4* 32.8*  PLT 191 176   BMET Recent Labs    11/12/18 1809 11/12/18 2009 11/13/18 0622  NA 139  --  135  K 3.5  --  4.9  CL 111  --  107  CO2 19*  --  21*  GLUCOSE 88  --  99  BUN 20  --  23  CREATININE 1.31* 1.53* 1.73*  CALCIUM 7.8*  --  8.3*    Studies/Results: Dg Chest Port 1 View  Result Date: 11/12/2018 CLINICAL DATA:  Shortness of breath and fever. Recent anesthesia for urologic procedure. EXAM: PORTABLE CHEST 1 VIEW COMPARISON:  05/01/2018; chest CT-10/22/2018 FINDINGS: Grossly unchanged cardiac silhouette and mediastinal contours with atherosclerotic plaque within the thoracic aorta. Stable positioning of support apparatus. No focal airspace opacities. No pleural effusion or pneumothorax. No evidence of edema. No acute osseous abnormalities. Moderate to severe degenerative change of the bilateral glenohumeral joints, right greater than left, incompletely evaluated. IMPRESSION: No acute cardiopulmonary disease. Specifically, no evidence of pneumonia and/or aspiration. Electronically Signed   By: Sandi Mariscal M.D.   On: 11/12/2018 19:21     Assessment: 84 year old male with personal history of locally advanced bladder cancer, bilateral ureteral obstruction managed with bilateral indwelling ureteral stents postop day 1 status post exchange complicated by sepsis of urinary source.  Blood and urine cultures pending.  No admitted to hospitalist services for supportive care, broad-spectrum antibiotics.  Significant leukocytosis this morning with ongoing hypotension.  Plan: -Maintain Foley catheter, chronic -Agree with broad-spectrum antibiotics, received amp/gent yesterday intraoperatively, meropenem -Follow blood and urine cultures and adjust as needed -Continue supportive care per primary team -Urology will continue to follow along     LOS: 1 day    [name] 11/13/2018

## 2018-11-14 ENCOUNTER — Encounter: Payer: Self-pay | Admitting: Urology

## 2018-11-14 DIAGNOSIS — N390 Urinary tract infection, site not specified: Secondary | ICD-10-CM | POA: Diagnosis present

## 2018-11-14 DIAGNOSIS — C678 Malignant neoplasm of overlapping sites of bladder: Secondary | ICD-10-CM

## 2018-11-14 DIAGNOSIS — Z515 Encounter for palliative care: Secondary | ICD-10-CM

## 2018-11-14 LAB — CBC
HCT: 28.5 % — ABNORMAL LOW (ref 39.0–52.0)
Hemoglobin: 8.9 g/dL — ABNORMAL LOW (ref 13.0–17.0)
MCH: 30 pg (ref 26.0–34.0)
MCHC: 31.2 g/dL (ref 30.0–36.0)
MCV: 96 fL (ref 80.0–100.0)
Platelets: 118 10*3/uL — ABNORMAL LOW (ref 150–400)
RBC: 2.97 MIL/uL — ABNORMAL LOW (ref 4.22–5.81)
RDW: 17.3 % — ABNORMAL HIGH (ref 11.5–15.5)
WBC: 12.9 10*3/uL — ABNORMAL HIGH (ref 4.0–10.5)
nRBC: 0 % (ref 0.0–0.2)

## 2018-11-14 LAB — BASIC METABOLIC PANEL
Anion gap: 5 (ref 5–15)
BUN: 19 mg/dL (ref 8–23)
CO2: 20 mmol/L — ABNORMAL LOW (ref 22–32)
Calcium: 7.9 mg/dL — ABNORMAL LOW (ref 8.9–10.3)
Chloride: 109 mmol/L (ref 98–111)
Creatinine, Ser: 1.38 mg/dL — ABNORMAL HIGH (ref 0.61–1.24)
GFR calc Af Amer: 55 mL/min — ABNORMAL LOW (ref >60)
GFR calc non Af Amer: 48 mL/min — ABNORMAL LOW (ref >60)
GLUCOSE: 95 mg/dL (ref 70–99)
Potassium: 3.7 mmol/L (ref 3.5–5.1)
Sodium: 134 mmol/L — ABNORMAL LOW (ref 135–145)

## 2018-11-14 LAB — PROCALCITONIN: Procalcitonin: 82.77 ng/mL

## 2018-11-14 NOTE — Consult Note (Signed)
[place]  [phone] [phone]   Name: Vincent Poole Date: 11/14/2018 MRN: [id number]  DOB: 01/28/1935  Patient Care Team: [name], MD as PCP - General (Family Medicine)    REASON FOR CONSULTATION: Palliative Care consult requested for this 84 y.o. 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 He was admitted to the hospital on 11/12/18 with possible sepsis and transient bacteremia following cystoscopy and ureteral stent exchange. Palliative care was consulted to help address goals.   SOCIAL HISTORY:    Patient lives at home with his wife. He has a son and daughter. Patient used to work in [employer] as a Associate Professor.   ADVANCE DIRECTIVES:  Does not have  CODE STATUS: Full code  PAST MEDICAL HISTORY: Past Medical History:  Diagnosis Date  . Anxiety   . Arthritis   . Bladder cancer ([place])   . Dysrhythmia 07/2018   history of atrial flutter that worsens with anxiety  . Femur fracture, right ([place]) 05/01/2018  . GERD (gastroesophageal reflux disease)   . History of recent blood transfusion 05/2018  . Hypertension   . Iron deficiency anemia 09/14/2018  . Prostate cancer ([place]) 07/2018   cancer growing in prostate but not prostate cancer, it is from the bladder  . Umbilical hernia 40/3474  . Urinary retention 2019   foley catheter place 11/2017  . UTI (urinary tract infection) 2019   frequent UTI's over last year  . Wound eschar of foot 07/2018   left heal getting wrapped and requiring antibiotic cream. cracks open with weight bearing.    PAST SURGICAL HISTORY:  Past Surgical  History:  Procedure Laterality Date  . CARPAL TUNNEL RELEASE Right   . CHOLECYSTECTOMY  2004  . CYSTOGRAM  07/18/2018   Procedure: CYSTOGRAM;  Surgeon: [name], MD;  Location: [facility];  Service: Urology;;  . Consuela Mimes W/ RETROGRADES Bilateral 07/18/2018   Procedure: CYSTOSCOPY WITH RETROGRADE PYELOGRAM;  Surgeon: [name], MD;  Location: [facility];  Service: Urology;  Laterality: Bilateral;  . CYSTOSCOPY W/ URETERAL STENT PLACEMENT Bilateral 12/27/2017   Procedure: CYSTOSCOPY WITH RETROGRADE PYELOGRAM/URETERAL STENT PLACEMENT;  Surgeon: [name], MD;  Location: [facility];  Service: Urology;  Laterality: Bilateral;  . CYSTOSCOPY W/ URETERAL STENT PLACEMENT Bilateral 07/18/2018   Procedure: CYSTOSCOPY WITH STENT REPLACEMENT (exchange);  Surgeon: [name], MD;  Location: [facility];  Service: Urology;  Laterality: Bilateral;  . CYSTOSCOPY WITH STENT PLACEMENT Bilateral 11/12/2018   Procedure: [place] WITH STENT Exchange;  Surgeon: [name], MD;  Location: [facility];  Service: Urology;  Laterality: Bilateral;  . INTRAMEDULLARY (IM) NAIL INTERTROCHANTERIC Right 05/02/2018   Procedure: INTRAMEDULLARY (IM) NAIL INTERTROCHANTRIC;  Surgeon: [name], MD;  Location: [facility];  Service: Orthopedics;  Laterality: Right;  . LEG TENDON SURGERY Right 1958  . PORTA CATH INSERTION N/A 01/22/2018   Procedure: PORTA CATH INSERTION;  Surgeon: [name], MD;  Location: [facility];  Service: Cardiovascular;  Laterality: N/A;  . TRANSURETHRAL RESECTION OF BLADDER TUMOR N/A 12/27/2017   Procedure: TRANSURETHRAL RESECTION OF BLADDER TUMOR (TURBT);  Surgeon: [name], MD;  Location: [facility];  Service: Urology;  Laterality: N/A;  . TRANSURETHRAL RESECTION OF BLADDER TUMOR N/A 01/15/2018  Procedure: TRANSURETHRAL RESECTION OF BLADDER TUMOR (TURBT);  Surgeon: [name], MD;  Location: [facility];  Service: Urology;  Laterality: N/A;  Need 2 hrs for this case please   . TRANSURETHRAL RESECTION OF BLADDER TUMOR N/A 07/18/2018   Procedure: TRANSURETHRAL RESECTION OF BLADDER TUMOR (TURBT);  Surgeon: [name], MD;  Location: [facility];  Service: Urology;  Laterality: N/A;    HEMATOLOGY/ONCOLOGY HISTORY:    Malignant neoplasm of urinary bladder ([place])   01/12/2018 Initial Diagnosis    Malignant neoplasm of urinary bladder ([place])    08/02/2018 -  Chemotherapy    The patient had atezolizumab (TECENTRIQ) 1,200 mg in sodium chloride 0.9 % 250 mL chemo infusion, 1,200 mg, Intravenous, Once, 5 of 6 cycles Administration: 1,200 mg (08/02/2018), 1,200 mg (08/23/2018), 1,200 mg (09/13/2018), 1,200 mg (10/11/2018), 1,200 mg (11/02/2018)  for chemotherapy treatment.      ALLERGIES:  has No Known Allergies.  MEDICATIONS:  Current Facility-Administered Medications  Medication Dose Route Frequency Provider Last Rate Last Dose  . 0.9 %  sodium chloride infusion   Intravenous Continuous Salary, Holly Bodily D, MD 125 mL/hr at 11/14/18 1352    . acetaminophen (TYLENOL) tablet 650 mg  650 mg Oral Q6H PRN Epifanio Lesches, MD   650 mg at 11/12/18 1902   Or  . acetaminophen (TYLENOL) suppository 650 mg  650 mg Rectal Q6H PRN Epifanio Lesches, MD      . bisacodyl (DULCOLAX) EC tablet 5 mg  5 mg Oral Daily PRN Epifanio Lesches, MD      . docusate sodium (COLACE) capsule 100 mg  100 mg Oral BID Epifanio Lesches, MD   100 mg at 11/14/18 0906  . enoxaparin (LOVENOX) injection 40 mg  40 mg Subcutaneous Q24H Epifanio Lesches, MD   40 mg at 11/14/18 0816  . HYDROcodone-acetaminophen (NORCO/VICODIN) 5-325 MG per tablet 1-2 tablet  1-2 tablet Oral Q4H PRN Epifanio Lesches, MD   1 tablet at 11/13/18 1702  . meropenem (MERREM) 500 mg in sodium chloride 0.9 % 100 mL IVPB  500 mg Intravenous Q8H Oswald Hillock, RPH 200 mL/hr at 11/14/18 1353 500 mg at 11/14/18 1353  . mirabegron ER (MYRBETRIQ) tablet 50 mg  50 mg Oral Daily Epifanio Lesches, MD   50 mg at 11/14/18  0905  . ondansetron (ZOFRAN) tablet 4 mg  4 mg Oral Q6H PRN Epifanio Lesches, MD       Or  . ondansetron (ZOFRAN) injection 4 mg  4 mg Intravenous Q6H PRN Epifanio Lesches, MD   4 mg at 11/13/18 1709  . ondansetron (ZOFRAN-ODT) disintegrating tablet 4 mg  4 mg Oral Once Piscitello, Precious Haws, MD      . traZODone (DESYREL) tablet 25 mg  25 mg Oral QHS PRN Epifanio Lesches, MD        VITAL SIGNS: BP (!) 103/59 (BP Location: Right Arm)   Pulse 86   Temp 98.3 F (36.8 C) (Oral)   Resp 19   Ht '5\' 11"'$  (1.803 m)   Wt 171 lb 9.4 oz (77.8 kg)   SpO2 100%   BMI 23.93 kg/m  Filed Weights   11/12/18 1106 11/12/18 2021 11/13/18 0341  Weight: 170 lb 3.1 oz (77.2 kg) 171 lb 9.6 oz (77.8 kg) 171 lb 9.4 oz (77.8 kg)    Estimated body mass index is 23.93 kg/m as calculated from the following:   Height as of this encounter: '5\' 11"'$  (1.803 m).   Weight as of this encounter: 171 lb 9.4 oz (77.8  kg).  LABS: CBC:    Component Value Date/Time   WBC 12.9 (H) 11/14/2018 0513   HGB 8.9 (L) 11/14/2018 0513   HCT 28.5 (L) 11/14/2018 0513   PLT 118 (L) 11/14/2018 0513   MCV 96.0 11/14/2018 0513   NEUTROABS 9.9 (H) 11/02/2018 0850   LYMPHSABS 1.9 11/02/2018 0850   MONOABS 0.9 11/02/2018 0850   EOSABS 0.2 11/02/2018 0850   BASOSABS 0.1 11/02/2018 0850   Comprehensive Metabolic Panel:    Component Value Date/Time   NA 134 (L) 11/14/2018 0513   K 3.7 11/14/2018 0513   CL 109 11/14/2018 0513   CO2 20 (L) 11/14/2018 0513   BUN 19 11/14/2018 0513   CREATININE 1.38 (H) 11/14/2018 0513   GLUCOSE 95 11/14/2018 0513   CALCIUM 7.9 (L) 11/14/2018 0513   AST 14 (L) 11/02/2018 0850   ALT 9 11/02/2018 0850   ALKPHOS 72 11/02/2018 0850   BILITOT 0.3 11/02/2018 0850   PROT 7.4 11/02/2018 0850   ALBUMIN 3.5 11/02/2018 0850    RADIOGRAPHIC STUDIES: Ct Chest W Contrast  Result Date: 10/22/2018 CLINICAL DATA:  Locally advanced high-grade urothelial carcinoma, status post therapy with  residual/recurrent tumor. EXAM: CT CHEST, ABDOMEN, AND PELVIS WITH CONTRAST TECHNIQUE: Multidetector CT imaging of the chest, abdomen and pelvis was performed following the standard protocol during bolus administration of intravenous contrast. CONTRAST:  144m ISOVUE-300 IOPAMIDOL (ISOVUE-300) INJECTION 61% COMPARISON:  07/12/2018. FINDINGS: CT CHEST FINDINGS Cardiovascular: Right IJ Port-A-Cath terminates in the SVC. Atherosclerotic calcification of the aorta and coronary arteries. Heart size normal. No pericardial effusion. Mediastinum/Nodes: No pathologically enlarged mediastinal, hilar or axillary lymph nodes. Esophagus is grossly unremarkable. Lungs/Pleura: Favor scarring in the apical segment right upper lobe. Mild basilar bronchiectasis. Bilateral pleural calcifications. Lungs are otherwise clear. No pleural fluid. Airway is unremarkable. Musculoskeletal: Large area of lucency in the T10 vertebral body is unchanged. Degenerative changes in the spine and shoulders. CT ABDOMEN PELVIS FINDINGS Hepatobiliary: Liver is unremarkable. Cholecystectomy. No biliary ductal dilatation. Pancreas: Calcifications are seen in the pancreas. No ductal dilatation. Spleen: Negative. Adrenals/Urinary Tract: Adrenal glands are unremarkable. Low-attenuation lesions in the kidneys measure up to 2.8 cm on the right and are likely cysts. Double-J ureteral stents are in place bilaterally. Right ureter is decompressed. There is increasing dilatation of the left intrarenal collecting system and left ureter when compared with 07/12/2018. Small amount of air in the left ureter is presumably iatrogenic. Foley catheter is seen in a low volume bladder which appears thick-walled. Stomach/Bowel: Stomach is unremarkable. Duodenal diverticula are incidentally noted. Small bowel and appendix are otherwise unremarkable. Stool is seen throughout the colon, indicative of constipation. Vascular/Lymphatic: Atherosclerotic calcification throughout the  arterial vasculature without abdominal aortic aneurysm. Retroperitoneal lymph nodes are not enlarged by CT size criteria. No pathologically enlarged lymph nodes. Reproductive: Prostate is visualized. Other: Mild nonspecific presacral edema. No free fluid. Mesenteries and peritoneum are otherwise unremarkable. Musculoskeletal: Postoperative changes in the right proximal femur. Advanced degenerative changes in both hips, right greater than left. Degenerative changes in the spine. Lucency within the L2 vertebral body, unchanged. No worrisome lytic or sclerotic lesions. IMPRESSION: 1. Increasing left hydronephrosis with a double-J left ureteral stent in place. Right-sided hydronephrosis has improved significantly in the interval. Diffuse bladder wall thickening. 2. No evidence of metastatic disease. 3. 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 4. Asbestos related pleural disease. 5. Aortic atherosclerosis (ICD10-170.0). Coronary artery calcification. Electronically Signed   By: MLorin PicketM.D.   On:  10/22/2018 13:29   Ct Abdomen Pelvis W Contrast  Result Date: 10/22/2018 CLINICAL DATA:  Locally advanced high-grade urothelial carcinoma, status post therapy with residual/recurrent tumor. EXAM: CT CHEST, ABDOMEN, AND PELVIS WITH CONTRAST TECHNIQUE: Multidetector CT imaging of the chest, abdomen and pelvis was performed following the standard protocol during bolus administration of intravenous contrast. CONTRAST:  170m ISOVUE-300 IOPAMIDOL (ISOVUE-300) INJECTION 61% COMPARISON:  07/12/2018. FINDINGS: CT CHEST FINDINGS Cardiovascular: Right IJ Port-A-Cath terminates in the SVC. Atherosclerotic calcification of the aorta and coronary arteries. Heart size normal. No pericardial effusion. Mediastinum/Nodes: No pathologically enlarged mediastinal, hilar or axillary lymph nodes. Esophagus is grossly unremarkable. Lungs/Pleura: Favor scarring in the apical segment right upper lobe.  Mild basilar bronchiectasis. Bilateral pleural calcifications. Lungs are otherwise clear. No pleural fluid. Airway is unremarkable. Musculoskeletal: Large area of lucency in the T10 vertebral body is unchanged. Degenerative changes in the spine and shoulders. CT ABDOMEN PELVIS FINDINGS Hepatobiliary: Liver is unremarkable. Cholecystectomy. No biliary ductal dilatation. Pancreas: Calcifications are seen in the pancreas. No ductal dilatation. Spleen: Negative. Adrenals/Urinary Tract: Adrenal glands are unremarkable. Low-attenuation lesions in the kidneys measure up to 2.8 cm on the right and are likely cysts. Double-J ureteral stents are in place bilaterally. Right ureter is decompressed. There is increasing dilatation of the left intrarenal collecting system and left ureter when compared with 07/12/2018. Small amount of air in the left ureter is presumably iatrogenic. Foley catheter is seen in a low volume bladder which appears thick-walled. Stomach/Bowel: Stomach is unremarkable. Duodenal diverticula are incidentally noted. Small bowel and appendix are otherwise unremarkable. Stool is seen throughout the colon, indicative of constipation. Vascular/Lymphatic: Atherosclerotic calcification throughout the arterial vasculature without abdominal aortic aneurysm. Retroperitoneal lymph nodes are not enlarged by CT size criteria. No pathologically enlarged lymph nodes. Reproductive: Prostate is visualized. Other: Mild nonspecific presacral edema. No free fluid. Mesenteries and peritoneum are otherwise unremarkable. Musculoskeletal: Postoperative changes in the right proximal femur. Advanced degenerative changes in both hips, right greater than left. Degenerative changes in the spine. Lucency within the L2 vertebral body, unchanged. No worrisome lytic or sclerotic lesions. IMPRESSION: 1. Increasing left hydronephrosis with a double-J left ureteral stent in place. Right-sided hydronephrosis has improved significantly in the  interval. Diffuse bladder wall thickening. 2. No evidence of metastatic disease. 3. 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 4. Asbestos related pleural disease. 5. Aortic atherosclerosis (ICD10-170.0). Coronary artery calcification. Electronically Signed   By: MLorin PicketM.D.   On: 10/22/2018 13:29   Dg Chest Port 1 View  Result Date: 11/12/2018 CLINICAL DATA:  Shortness of breath and fever. Recent anesthesia for urologic procedure. EXAM: PORTABLE CHEST 1 VIEW COMPARISON:  05/01/2018; chest CT-10/22/2018 FINDINGS: Grossly unchanged cardiac silhouette and mediastinal contours with atherosclerotic plaque within the thoracic aorta. Stable positioning of support apparatus. No focal airspace opacities. No pleural effusion or pneumothorax. No evidence of edema. No acute osseous abnormalities. Moderate to severe degenerative change of the bilateral glenohumeral joints, right greater than left, incompletely evaluated. IMPRESSION: No acute cardiopulmonary disease. Specifically, no evidence of pneumonia and/or aspiration. Electronically Signed   By: JSandi MariscalM.D.   On: 11/12/2018 19:21    PERFORMANCE STATUS (ECOG) : 3 - Symptomatic, >50% confined to bed  Review of Systems As noted above. Otherwise, a complete review of systems is negative.  Physical Exam General:  frail appearing Pulmonary: unlabored, on O2 Abdomen: soft, nontender GU: no suprapubic tenderness Extremities: no edema, no joint deformities Skin: no rashes Neurological: Weakness but otherwise  nonfocal, HOH  IMPRESSION: I met with patient, wife, and daughter to discuss goals.  Patient primarily deferred to his wife regarding the history and decision making.  Wife describes a pattern of decline over the past month or so.  At baseline, patient lives at home with her but requires significant assistance with ADLs.  Patient is ambulatory with use of a walker but cannot stand on his own.  He sleeps in  a recliner. Wife says she expects patient to be even frailer following this hospitalization.  She says they "take one step forward and two steps back." She does verbalize appreciation for the medical care he has received here.   Wife says she does want him home and would not entertain the idea of a higher level of care.  We talked about home health but she says she has refused it in the past and feels she can adequately manage his care in the home.   Wife says that both patient and family still desire ongoing treatment.  She says they would want to continue treating him "as long as he has a breath in him".  We talked about how sometimes treatment can impact one's quality of life particularly in regards to end-of-life care.  We discussed CODE STATUS at length.  Both wife and daughter were quick to state that patient would want to be resuscitated even while verbalizing an understanding that the outcome would likely be poor.  However, they said that they would continue discussing those decisions as a family.   PLAN: Continue current scope of medical treatment Full Code I am happy to follow patient in the outpatient clinic   Time Total: 50 minutes  Visit consisted of counseling and education dealing with the complex and emotionally intense issues of symptom management and palliative care in the setting of serious and potentially life-threatening illness.Greater than 50%  of this time was spent counseling and coordinating care related to the above assessment and plan.  Signed by: Altha Harm, PhD, NP-C [phone] (Work Cell)

## 2018-11-14 NOTE — Plan of Care (Signed)
  Problem: Clinical Measurements: Goal: Ability to maintain clinical measurements within normal limits will improve Outcome: Not Progressing Note:  Sodium level is slightly low today at 134. Will continue to monitor lab values. Wenda Low [facility]

## 2018-11-15 LAB — URINE CULTURE: Culture: NO GROWTH

## 2018-11-15 LAB — GLUCOSE, CAPILLARY: GLUCOSE-CAPILLARY: 77 mg/dL (ref 70–99)

## 2018-11-15 MED ORDER — ORAL CARE MOUTH RINSE
15.0000 mL | Freq: Two times a day (BID) | OROMUCOSAL | Status: DC
  Administered 2018-11-15 – 2018-11-16 (×3): 15 mL via OROMUCOSAL

## 2018-11-15 MED ORDER — SODIUM CHLORIDE 0.9 % IV SOLN
2.0000 g | INTRAVENOUS | Status: DC
  Administered 2018-11-15: 17:00:00 2 g via INTRAVENOUS
  Filled 2018-11-15: qty 2
  Filled 2018-11-15: qty 20

## 2018-11-15 MED ORDER — SODIUM CHLORIDE 0.9 % IV SOLN
1.0000 g | Freq: Two times a day (BID) | INTRAVENOUS | Status: DC
  Filled 2018-11-15: qty 1

## 2018-11-15 NOTE — Care Management Important Message (Signed)
Important Message  Patient Details  Name: Vincent Poole MRN: [id number] Date of Birth: July 12, 1935   Medicare Important Message Given:  Yes    Juliann Pulse A Jarrod Bodkins 11/15/2018, 10:39 AM

## 2018-11-15 NOTE — Progress Notes (Signed)
Pharmacy Antibiotic Note  Vincent Poole is a 84 y.o. male admitted on 11/12/2018 with sepsis, was febrile upon admission. Patient currently has urothelial and bladder cancer. Current source is UTI secondary to chronic indwelling catheter. Pharmacy has been consulted for meropenem dosing.  Plan: Will increase meropenem to 1 g q12h .  Height: 5\' 11"  (180.3 cm) Weight: 187 lb 13.3 oz (85.2 kg) IBW/kg (Calculated) : 75.3  Temp (24hrs), Avg:98.4 F (36.9 C), Min:97.7 F (36.5 C), Max:99.3 F (37.4 C)  Recent Labs  Lab 11/12/18 1809 11/12/18 2009 11/12/18 2149 11/13/18 0622 11/13/18 0830 11/14/18 0513  WBC 8.8 16.9*  --  26.3*  --  12.9*  CREATININE 1.31* 1.53*  --  1.73*  --  1.38*  LATICACIDVEN  --  2.1* 2.9*  --  1.9  --     Estimated Creatinine Clearance: 44.7 mL/min (A) (by C-G formula based on SCr of 1.38 mg/dL (H)).    No Known Allergies  Antimicrobials this admission: 1/6 ampicillin x 1 1/6 gentamicin x 1  1/6 meropenem >>   Dose adjustments this admission: 1/9 Will increase meropenem to 1 g q12h   Microbiology results: 1/6 BCx NG x 3 days 1/8 UCx pending  Thank you for allowing pharmacy to be a part of this patient's care.   Paticia Stack, PharmD Pharmacy Resident  11/15/2018 9:34 AM

## 2018-11-15 NOTE — Progress Notes (Signed)
[place] [facility] [place] NAME: Vincent Poole    MR#:  [id number]  DATE OF BIRTH:  02/09/1935  SUBJECTIVE:  Patient is lethargic, wife at the bedside, advanced directives discussed-patient is full code  REVIEW OF SYSTEMS:  Review of Systems  Constitutional: Positive for malaise/fatigue. Negative for chills and fever.  HENT: Positive for hearing loss. Negative for sore throat.   Eyes: Negative for blurred vision and double vision.  Respiratory: Negative for cough, hemoptysis, shortness of breath, wheezing and stridor.   Cardiovascular: Negative for chest pain, palpitations, orthopnea and leg swelling.  Gastrointestinal: Negative for abdominal pain, blood in stool, diarrhea, melena, nausea and vomiting.  Genitourinary: Negative for dysuria, flank pain and hematuria.  Musculoskeletal: Negative for back pain and joint pain.  Skin: Negative for rash.  Neurological: Negative for dizziness, sensory change, focal weakness, seizures, loss of consciousness, weakness and headaches.  Endo/Heme/Allergies: Negative for polydipsia.  Psychiatric/Behavioral: Negative for depression. The patient is not nervous/anxious.     DRUG ALLERGIES:  No Known Allergies VITALS:  Blood pressure (!) 107/58, pulse 80, temperature 98 F (36.7 C), temperature source Oral, resp. rate 15, height 5\' 11"  (1.803 m), weight 85.2 kg, SpO2 97 %. PHYSICAL EXAMINATION:  Physical Exam Constitutional:      General: He is not in acute distress. HENT:     Head: Normocephalic.     Mouth/Throat:     Mouth: Mucous membranes are moist.  Eyes:     General: No scleral icterus.    Conjunctiva/sclera: Conjunctivae normal.     Pupils: Pupils are equal, round, and reactive to light.  Neck:     Musculoskeletal: Normal range of motion and neck supple.     Vascular: No JVD.     Trachea: No tracheal deviation.  Cardiovascular:     Rate and Rhythm: Normal rate and regular rhythm.     Heart  sounds: Normal heart sounds. No murmur. No gallop.   Pulmonary:     Effort: Pulmonary effort is normal. No respiratory distress.     Breath sounds: Normal breath sounds. No wheezing or rales.  Abdominal:     General: Bowel sounds are normal. There is no distension.     Palpations: Abdomen is soft.     Tenderness: There is no abdominal tenderness. There is no rebound.  Genitourinary:    Comments: Chronic Foley in situ. Musculoskeletal: Normal range of motion.        General: No tenderness.     Right lower leg: No edema.     Left lower leg: No edema.  Skin:    Findings: No erythema or rash.  Neurological:     General: No focal deficit present.     Mental Status: He is alert and oriented to person, place, and time.     Cranial Nerves: No cranial nerve deficit.  Psychiatric:        Mood and Affect: Mood normal.    LABORATORY PANEL:  Male CBC Recent Labs  Lab 11/14/18 0513  WBC 12.9*  HGB 8.9*  HCT 28.5*  PLT 118*   ------------------------------------------------------------------------------------------------------------------ Chemistries  Recent Labs  Lab 11/14/18 0513  NA 134*  K 3.7  CL 109  CO2 20*  GLUCOSE 95  BUN 19  CREATININE 1.38*  CALCIUM 7.9*   RADIOLOGY:  No results found. ASSESSMENT AND PLAN:  84 year old male patient with history of prostate cancer, recurrent UTIs, urine retention seen by Dr. Erlene Quan , patient also has bladder  cancer and now has high-grade urothelial cancer, had cystoscopy, with bilateral ureteral stent for ureteral obstruction, original stent was placed on January 6, today patient had stent exchange by Dr. Erlene Quan developed fever up to 100.8, tachycardic with heart rate around 130 bpm, followed by Dr. Erlene Quan for overnight admission for possible transient bacteremia.  *Acute septic shock  Resolved  Secondary to urinary etiology/complicated UTI due to chronic indwelling Foley Continue sepsis protocol, empiric meropenem, follow-up  on outstanding cultures  *Acute complicated UTI secondary to chronic indwelling Foley  Plan of care as stated above Exacerbated by obstructive uropathy, status post ureteral stent exchange by urology  *Acute lactic acidosis Resolving with IV fluids for rehydration  *AKI on CKD stage III Resolved with IV fluid support   *History of bladder cancer/high-grade urothelial cancer prognosis poor Palliative care consulted  *Chronic benign essential hypertension  Stable  Antihypertensives on hold for relative hypotension on yesterday   *Chronic Iron deficiency anemia Stable  All the records are reviewed and case discussed with Care Management/Social Worker. Management plans discussed with the patient, his wife and they are in agreement.  CODE STATUS: Full Code  TOTAL TIME TAKING CARE OF THIS PATIENT: 28 minutes.   More than 50% of the time was spent in counseling/coordination of care: YES  POSSIBLE D/C IN 2-3 DAYS, DEPENDING ON CLINICAL CONDITION.   Avel Peace Salary M.D on 11/15/2018 at 1:51 PM  Between 7am to 6pm - Pager - [phone]  After 6pm go to [email or web address] - [facility]

## 2018-11-15 NOTE — Progress Notes (Signed)
[place] [facility] [place] NAME: Vincent Poole    MR#:  [id number]  DATE OF BIRTH:  14-Nov-1934  SUBJECTIVE:  Lethargic, wife present   REVIEW OF SYSTEMS:  CONSTITUTIONAL: No fever, fatigue or weakness.  EYES: No blurred or double vision.  EARS, NOSE, AND THROAT: No tinnitus or ear pain.  RESPIRATORY: No cough, shortness of breath, wheezing or hemoptysis.  CARDIOVASCULAR: No chest pain, orthopnea, edema.  GASTROINTESTINAL: No nausea, vomiting, diarrhea or abdominal pain.  GENITOURINARY: No dysuria, hematuria.  ENDOCRINE: No polyuria, nocturia,  HEMATOLOGY: No anemia, easy bruising or bleeding SKIN: No rash or lesion. MUSCULOSKELETAL: No joint pain or arthritis.   NEUROLOGIC: No tingling, numbness, weakness.  PSYCHIATRY: No anxiety or depression.   ROS  DRUG ALLERGIES:  No Known Allergies  VITALS:  Blood pressure (!) 107/58, pulse 80, temperature 98 F (36.7 C), temperature source Oral, resp. rate 15, height 5\' 11"  (1.803 m), weight 85.2 kg, SpO2 97 %.  PHYSICAL EXAMINATION:  GENERAL:  84 y.o.-year-old patient lying in the bed with no acute distress.  EYES: Pupils equal, round, reactive to light and accommodation. No scleral icterus. Extraocular muscles intact.  HEENT: Head atraumatic, normocephalic. Oropharynx and nasopharynx clear.  NECK:  Supple, no jugular venous distention. No thyroid enlargement, no tenderness.  LUNGS: Normal breath sounds bilaterally, no wheezing, rales,rhonchi or crepitation. No use of accessory muscles of respiration.  CARDIOVASCULAR: S1, S2 normal. No murmurs, rubs, or gallops.  ABDOMEN: Soft, nontender, nondistended. Bowel sounds present. No organomegaly or mass.  EXTREMITIES: No pedal edema, cyanosis, or clubbing.  NEUROLOGIC: Cranial nerves II through XII are intact. Muscle strength 5/5 in all extremities. Sensation intact. Gait not checked.  PSYCHIATRIC: The patient is alert and oriented x 3.  SKIN: No  obvious rash, lesion, or ulcer.   Physical Exam LABORATORY PANEL:   CBC Recent Labs  Lab 11/14/18 0513  WBC 12.9*  HGB 8.9*  HCT 28.5*  PLT 118*   ------------------------------------------------------------------------------------------------------------------  Chemistries  Recent Labs  Lab 11/14/18 0513  NA 134*  K 3.7  CL 109  CO2 20*  GLUCOSE 95  BUN 19  CREATININE 1.38*  CALCIUM 7.9*   ------------------------------------------------------------------------------------------------------------------  Cardiac Enzymes No results for input(s): TROPONINI in the last 168 hours. ------------------------------------------------------------------------------------------------------------------  RADIOLOGY:  No results found.  ASSESSMENT AND PLAN:  84 year old male patient with history of prostate cancer, recurrent UTIs, urine retention seen by Dr. 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 Erlene Quan developed fever up to 100.8, tachycardic with heart rate around 130 bpm, followed by Dr. Erlene Quan for overnight admission for possible transient bacteremia.  *Acute septic shock  Resolved  Secondary to urinary etiology/complicated UTI due to chronic indwelling Foley Continue sepsis protocol, empiric meropenem, follow-up on outstanding cultures  *Acute complicated UTI secondary to chronic indwelling Foley  Plan of care as stated above Exacerbated by obstructive uropathy, status post ureteral stent exchange by urology  *Acute lactic acidosis Resolving with IV fluids for rehydration  *AKI on CKD stage III Resolved with IV fluid support   *History of bladder cancer/high-grade urothelial cancer prognosis poor Palliative care consulted  *Chronic benign essential hypertension   Stable  Antihypertensives on hold for relative hypotension on yesterday   *Chronic Iron deficiency anemia Stable   All the records are reviewed and case discussed with Care Management/Social  Workerr. Management plans discussed with the patient, family and they are in agreement.  CODE STATUS: full  TOTAL TIME TAKING CARE OF THIS PATIENT: 35 minutes.     POSSIBLE D/C IN 1-2 DAYS, DEPENDING ON CLINICAL CONDITION.   Avel Peace Salary M.D on 11/15/2018   Between 7am to 6pm - Pager - [phone]  After 6pm go to [email or web address] - password [email or web address] [facility]  Office  [phone]  CC: Primary care physician; [name], MD  Note: This dictation was prepared with Dragon dictation along with smaller phrase technology. Any transcriptional errors that result from this process are unintentional.

## 2018-11-15 NOTE — Progress Notes (Signed)
Pt has discharge order from 11/12/18, clarified with Dr. Jerelyn Charles if pt was discharging. Dr. Jerelyn Charles states to DC discharge order. Unable to dc discharge order. Charge nurse made aware.

## 2018-11-16 LAB — URINALYSIS, COMPLETE (UACMP) WITH MICROSCOPIC
Bilirubin Urine: NEGATIVE
Glucose, UA: NEGATIVE mg/dL
Ketones, ur: NEGATIVE mg/dL
Nitrite: NEGATIVE
Protein, ur: 30 mg/dL — AB
RBC / HPF: >50 RBC/hpf — ABNORMAL HIGH (ref 0–5)
Specific Gravity, Urine: 1.005 (ref 1.005–1.030)
WBC, UA: >50 WBC/hpf — ABNORMAL HIGH (ref 0–5)
pH: 6 (ref 5.0–8.0)

## 2018-11-16 LAB — GLUCOSE, CAPILLARY
Glucose-Capillary: 85 mg/dL (ref 70–99)
Glucose-Capillary: 85 mg/dL (ref 70–99)

## 2018-11-16 MED ORDER — CEFDINIR 300 MG PO CAPS: 300.0000 mg | ORAL_CAPSULE | Freq: Two times a day (BID) | ORAL | 0 refills | Status: DC

## 2018-11-16 NOTE — Anesthesia Postprocedure Evaluation (Signed)
Anesthesia Post Note  Patient: Vincent Poole  Procedure(s) Performed: CYSTOSCOPY WITH STENT Exchange (Bilateral )  Patient location during evaluation: PACU Anesthesia Type: General Level of consciousness: awake and alert Pain management: pain level controlled Vital Signs Assessment: post-procedure vital signs reviewed and stable Respiratory status: spontaneous breathing, nonlabored ventilation, respiratory function stable and patient connected to nasal cannula oxygen Cardiovascular status: blood pressure returned to baseline and stable Postop Assessment: no apparent nausea or vomiting Anesthetic complications: no     Last Vitals:  Vitals:   11/15/18 1918 11/16/18 0513  BP: 125/60 (!) 126/58  Pulse: 93 77  Resp: 20 18  Temp: 37.2 C 36.7 C  SpO2: 99% 94%    Last Pain:  Vitals:   11/16/18 0900  TempSrc:   PainSc: 0-No pain                 Alphonsus Sias

## 2018-11-16 NOTE — Progress Notes (Signed)
Home at this time via w/c with family. A/o. No distress. Iv sites d/cd.  Discharged discussed with pt and family  .  No voiced c/o discharge per chris bennett rn

## 2018-11-16 NOTE — Discharge Summary (Signed)
[facility] [facility] [place] NAME: Vincent Poole    MR#:  [id number]  DATE OF BIRTH:  May 14, 1935  DATE OF ADMISSION:  11/12/2018 ADMITTING PHYSICIAN: Saundra Shelling, MD  DATE OF DISCHARGE: No discharge date for patient encounter.  PRIMARY CARE PHYSICIAN: [name], MD    ADMISSION DIAGNOSIS:  Complicated UTI (urinary tract infection) [N39.0]  DISCHARGE DIAGNOSIS:  Active Problems:   Sepsis ([place])   Palliative care encounter   Complicated UTI (urinary tract infection)   SECONDARY DIAGNOSIS:   Past Medical History:  Diagnosis Date  . Anxiety   . Arthritis   . Bladder cancer ([place])   . Dysrhythmia 07/2018   history of atrial flutter that worsens with anxiety  . Femur fracture, right ([place]) 05/01/2018  . GERD (gastroesophageal reflux disease)   . History of recent blood transfusion 05/2018  . Hypertension   . Iron deficiency anemia 09/14/2018  . Prostate cancer ([place]) 07/2018   cancer growing in prostate but not prostate cancer, it is from the bladder  . Umbilical hernia 00/7121  . Urinary retention 2019   foley catheter place 11/2017  . UTI (urinary tract infection) 2019   frequent UTI's over last year  . Wound eschar of foot 07/2018   left heal getting wrapped and requiring antibiotic cream. cracks open with weight bearing.    HOSPITAL COURSE:  84 year old male patient with history of prostate cancer, recurrent UTIs, urine retention seen by Dr. 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 Erlene Quan developed fever up to 100.8, tachycardic with heart rate around 130 bpm, followed by Dr. Erlene Quan for overnight admission for possible transient bacteremia.  *Acute septic shock  Resolved  Secondary to urinary etiology/complicated UTI due to  chronic indwelling Foley Treated on our sepsis protocol, treated initially with meropenem initially-changed to ceftriaxone, to complete antibiotic course on Omnicef, urine culture negative for growth  *Acute complicated UTI secondary to chronic indwelling Foley  Resolving Plan of care as stated above Exacerbated by obstructive uropathy, status post ureteral stent exchange by urology/Dr. Erlene Quan, to follow-up with urology status post discharge for reevaluation-appointment arranged for  *Acute lactic acidosis Resolved with IV fluids for rehydration  *AKI on CKD stage III Resolved with IV fluid support   *History of bladder cancer/high-grade urothelial cancer prognosis poor Palliative care consulted while in house  *Chronic benign essential hypertension  Stable  Antihypertensives initially held while in house due to hypotension    *Chronic Iron deficiency anemia Stable    DISCHARGE CONDITIONS:   stable  CONSULTS OBTAINED:  Treatment Team:  Epifanio Lesches, MD  DRUG ALLERGIES:  No Known Allergies  DISCHARGE MEDICATIONS:   Allergies as of 11/16/2018   No Known Allergies     Medication List    STOP taking these medications   sulfamethoxazole-trimethoprim 800-160 MG tablet Commonly known as:  BACTRIM DS,SEPTRA DS     TAKE these medications   acetaminophen 500 MG tablet Commonly known as:  TYLENOL Take 1,000 mg by mouth every 6 (six) hours as needed for moderate pain or headache.   cefdinir 300 MG capsule Commonly known as:  OMNICEF Take 1 capsule (300 mg total) by mouth 2 (two) times daily.   cetirizine 10 MG tablet Commonly known as:  ZYRTEC Take 10 mg by mouth daily as needed for allergies.  CRANBERRY PO Take 1 capsule by mouth 2 (two) times daily.   ferrous sulfate 325 (65 FE) MG tablet Take 1 tablet (325 mg total) by mouth 2 (two) times daily with a meal.   multivitamin with minerals Tabs tablet Take 1 tablet by mouth daily.    MYRBETRIQ 50 MG Tb24 tablet Generic drug:  mirabegron ER TAKE 1 TABLET DAILY   nystatin powder Commonly known as:  NYAMYC Apply topically 2 (two) times daily.   polyethylene glycol powder powder Commonly known as:  MIRALAX Take 17 g by mouth daily as needed. Can increase to 3 times a day as needed for constipation but hold medication if has diarrhea   vitamin B-12 500 MCG tablet Commonly known as:  CYANOCOBALAMIN Take 500 mcg by mouth daily.   zinc oxide 20 % ointment Apply 1 application topically as needed for irritation.        DISCHARGE INSTRUCTIONS:      If you experience worsening of your admission symptoms, develop shortness of breath, life threatening emergency, suicidal or homicidal thoughts you must seek medical attention immediately by calling 911 or calling your MD immediately  if symptoms less severe.  You Must read complete instructions/literature along with all the possible adverse reactions/side effects for all the Medicines you take and that have been prescribed to you. Take any new Medicines after you have completely understood and accept all the possible adverse reactions/side effects.   Please note  You were cared for by a hospitalist during your hospital stay. If you have any questions about your discharge medications or the care you received while you were in the hospital after you are discharged, you can call the unit and asked to speak with the hospitalist on call if the hospitalist that took care of you is not available. Once you are discharged, your primary care physician will handle any further medical issues. Please note that NO REFILLS for any discharge medications will be authorized once you are discharged, as it is imperative that you return to your primary care physician (or establish a relationship with a primary care physician if you do not have one) for your aftercare needs so that they can reassess your need for medications and monitor your lab  values.    Today   CHIEF COMPLAINT:  No chief complaint on file.   HISTORY OF PRESENT ILLNESS:  84 y.o. male with a known history of bladder cancer, bilateral ureteral obstruction, had a cystoscopy with stent exchange by Dr. Erlene Quan today, postoperatively patient developed fever, tachycardia, concerning this we are scheduling the patient for possible transient bacteremia.  Patient is very tachycardic with heart rate up to 130 bpm, slightly hypotensive, received 1 dose of metoprolol 2.5 IV push, dose of gentamicin IV, about 1500 mL IV fluids so far.  Patient denies any abdominal pain, nausea, vomiting.  No chest pain or shortness of breath.  VITAL SIGNS:  Blood pressure (!) 126/58, pulse 77, temperature 98 F (36.7 C), temperature source Oral, resp. rate 18, height 5\' 11"  (1.803 m), weight 87.6 kg, SpO2 94 %.  I/O:    Intake/Output Summary (Last 24 hours) at 11/16/2018 1054 Last data filed at 11/16/2018 0511 Gross per 24 hour  Intake 2625.34 ml  Output 3600 ml  Net -974.66 ml    PHYSICAL EXAMINATION:  GENERAL:  84 y.o.-year-old patient lying in the bed with no acute distress.  EYES: Pupils equal, round, reactive to light and accommodation. No scleral icterus. Extraocular muscles intact.  HEENT: Head  atraumatic, normocephalic. Oropharynx and nasopharynx clear.  NECK:  Supple, no jugular venous distention. No thyroid enlargement, no tenderness.  LUNGS: Normal breath sounds bilaterally, no wheezing, rales,rhonchi or crepitation. No use of accessory muscles of respiration.  CARDIOVASCULAR: S1, S2 normal. No murmurs, rubs, or gallops.  ABDOMEN: Soft, non-tender, non-distended. Bowel sounds present. No organomegaly or mass.  EXTREMITIES: No pedal edema, cyanosis, or clubbing.  NEUROLOGIC: Cranial nerves II through XII are intact. Muscle strength 5/5 in all extremities. Sensation intact. Gait not checked.  PSYCHIATRIC: The patient is alert and oriented x 3.  SKIN: No obvious rash,  lesion, or ulcer.   DATA REVIEW:   CBC Recent Labs  Lab 11/14/18 0513  WBC 12.9*  HGB 8.9*  HCT 28.5*  PLT 118*    Chemistries  Recent Labs  Lab 11/14/18 0513  NA 134*  K 3.7  CL 109  CO2 20*  GLUCOSE 95  BUN 19  CREATININE 1.38*  CALCIUM 7.9*    Cardiac Enzymes No results for input(s): TROPONINI in the last 168 hours.  Microbiology Results  Results for orders placed or performed during the hospital encounter of 11/12/18  CULTURE, BLOOD (ROUTINE X 2) w Reflex to ID Panel     Status: None (Preliminary result)   Collection Time: 11/12/18  6:11 PM  Result Value Ref Range Status   Specimen Description BLOOD BLOOD LEFT FOREARM  Final   Special Requests   Final    BOTTLES DRAWN AEROBIC AND ANAEROBIC Blood Culture results may not be optimal due to an excessive volume of blood received in culture bottles   Culture   Final    NO GROWTH 4 DAYS Performed at [facility], [address], [place], [place] [address]    Report Status PENDING  Incomplete  CULTURE, BLOOD (ROUTINE X 2) w Reflex to ID Panel     Status: None (Preliminary result)   Collection Time: 11/12/18  6:19 PM  Result Value Ref Range Status   Specimen Description BLOOD RIGHT ANTECUBITAL  Final   Special Requests   Final    BOTTLES DRAWN AEROBIC AND ANAEROBIC Blood Culture results may not be optimal due to an excessive volume of blood received in culture bottles   Culture   Final    NO GROWTH 4 DAYS Performed at [facility], [address], [place], [place] [address]    Report Status PENDING  Incomplete  Urine Culture     Status: None   Collection Time: 11/14/18 10:05 AM  Result Value Ref Range Status   Specimen Description   Final    URINE, CATHETERIZED Performed at [facility], [address] [place], [place] [address]    Special Requests   Final    NONE Performed at [facility], [address], [place], [place] [address]    Culture   Final    NO  GROWTH Performed at [facility], [place] [address] [place], [place] [address]    Report Status 11/15/2018 FINAL  Final    RADIOLOGY:  No results found.  EKG:   Orders placed or performed during the hospital encounter of 11/12/18  . EKG 12-Lead  . EKG 12-Lead  . EKG 12-Lead  . EKG 12-Lead      Management plans discussed with the patient, family and they are in agreement.  CODE STATUS:     Code Status Orders  (From admission, onward)         Start  Ordered   11/12/18 1849  Full code  Continuous     11/12/18 1849        Code Status History    Date Active Date Inactive Code Status Order ID Comments User Context   05/01/2018 1702 05/06/2018 1711 Full Code [id number]  Dustin Flock, MD Inpatient   12/26/2017 2141 12/30/2017 1727 Full Code [id number]  Demetrios Loll, MD Inpatient   05/31/2017 0208 06/01/2017 1922 Full Code [id number]  Harvie Bridge, DO Inpatient   03/04/2017 1833 03/05/2017 1426 Full Code [id number]  Idelle Crouch, MD Inpatient   07/11/2016 0538 07/13/2016 1420 Full Code [id number]  Saundra Shelling, MD ED      TOTAL TIME TAKING CARE OF THIS PATIENT: 40 minutes.    Avel Peace Lorelee Mclaurin M.D on 11/16/2018 at 10:54 AM  Between 7am to 6pm - Pager - [phone]  After 6pm go to [email or web address] - password [email or web address] [place] [facility]  Office  [phone]  CC: Primary care physician; [name], MD   Note: This dictation was prepared with Dragon dictation along with smaller phrase technology. Any transcriptional errors that result from this process are unintentional.

## 2018-11-16 NOTE — Evaluation (Signed)
Physical Therapy Evaluation Patient Details Name: Vincent Poole MRN: [id number] DOB: [birth year] Today's Date: 11/16/2018   History of Present Illness  Patient is an 84 year old male admitted from home with sepsis. Patient has PMH to include bladder cancer, anxiety, GERD, HTN. Patient had right femur fracture this past summer.    Clinical Impression  Patient HOH, received in bed, agrees to get up to recliner. Patient requires assistance with bed mobility due to right LE weakness and pain. Patient also reports pain at catheter site with movement of catheter. Patient requires min assist with sit to stand transfer and ambulation with rw to recliner. Patient has flexed posture and decreased tolerance to activity due to right LE pain/weakness. Patient will benefit from continued PT to address mobility limitations, pain and weakness.     Follow Up Recommendations Home health PT;Supervision/Assistance - 24 hour;Supervision for mobility/OOB    Equipment Recommendations  None recommended by PT    Recommendations for Other Services       Precautions / Restrictions Precautions Precautions: Fall Restrictions Weight Bearing Restrictions: No      Mobility  Bed Mobility Overal bed mobility: Needs Assistance Bed Mobility: Supine to Sit     Supine to sit: Mod assist     General bed mobility comments: assist to move LEs off bed and to raise trunk to seated postition. Able to scoot independently once seated.   Transfers Overall transfer level: Needs assistance Equipment used: Rolling walker (2 wheeled) Transfers: Sit to/from Stand Sit to Stand: Min assist;Modified independent (Device/Increase time)         General transfer comment: bed raised slightly  Ambulation/Gait Ambulation/Gait assistance: Modified independent (Device/Increase time);Min guard Gait Distance (Feet): 5 Feet Assistive device: Rolling walker (2 wheeled) Gait Pattern/deviations: Step-to pattern;Shuffle;Trunk  flexed;Antalgic Gait velocity: decreased   General Gait Details: patient reports pain with ambulation in right LE  Stairs            Wheelchair Mobility    Modified Rankin (Stroke Patients Only)       Balance Overall balance assessment: Modified Independent                                           Pertinent Vitals/Pain Pain Assessment: Faces Faces Pain Scale: Hurts little more Pain Location: R LE, catheter Pain Descriptors / Indicators: Aching;Discomfort Pain Intervention(s): Limited activity within patient's tolerance;Monitored during session    Home Living Family/patient expects to be discharged to:: Private residence Living Arrangements: Spouse/significant other Available Help at Discharge: Family;Available 24 hours/day Type of Home: Mobile home Home Access: Stairs to enter Entrance Stairs-Rails: Left Entrance Stairs-Number of Steps: 2 Home Layout: One level Home Equipment: Walker - 2 wheels;Cane - single point      Prior Function Level of Independence: Independent with assistive device(s)         Comments: Pt.'s wife assists with meal preparation, medication management, and transportation.     Hand Dominance   Dominant Hand: Right    Extremity/Trunk Assessment   Upper Extremity Assessment Upper Extremity Assessment: Overall WFL for tasks assessed    Lower Extremity Assessment Lower Extremity Assessment: Generalized weakness       Communication   Communication: HOH  Cognition Arousal/Alertness: Awake/alert Behavior During Therapy: WFL for tasks assessed/performed Overall Cognitive Status: Within Functional Limits for tasks assessed  General Comments      Exercises     Assessment/Plan    PT Assessment Patient needs continued PT services  PT Problem List Decreased strength;Decreased mobility;Decreased activity tolerance;Decreased balance;Pain       PT  Treatment Interventions Gait training;Therapeutic activities;Therapeutic exercise;Stair training;Functional mobility training;Patient/family education    PT Goals (Current goals can be found in the Care Plan section)  Acute Rehab PT Goals Patient Stated Goal: to return home with home health PT Goal Formulation: With patient/family Time For Goal Achievement: 11/30/18 Potential to Achieve Goals: Good    Frequency Min 2X/week   Barriers to discharge        Co-evaluation               AM-PAC PT "6 Clicks" Mobility  Outcome Measure Help needed turning from your back to your side while in a flat bed without using bedrails?: A Lot Help needed moving from lying on your back to sitting on the side of a flat bed without using bedrails?: A Little Help needed moving to and from a bed to a chair (including a wheelchair)?: A Little Help needed standing up from a chair using your arms (e.g., wheelchair or bedside chair)?: A Little Help needed to walk in hospital room?: A Little Help needed climbing 3-5 steps with a railing? : A Lot 6 Click Score: 16    End of Session Equipment Utilized During Treatment: Gait belt Activity Tolerance: Patient tolerated treatment well;Patient limited by pain Patient left: in chair;with call bell/phone within reach;with chair alarm set;with family/visitor present;with SCD's reapplied Nurse Communication: Mobility status PT Visit Diagnosis: Other abnormalities of gait and mobility (R26.89);Muscle weakness (generalized) (M62.81);History of falling (Z91.81);Difficulty in walking, not elsewhere classified (R26.2);Pain Pain - Right/Left: Right Pain - part of body: Leg    Time: 0920-0940 PT Time Calculation (min) (ACUTE ONLY): 20 min   Charges:   PT Evaluation $PT Eval Low Complexity: 1 Low PT Treatments $Gait Training: 8-22 mins        Jenniah Bhavsar, PT, GCS 11/16/18,10:15 AM

## 2018-11-16 NOTE — Care Management (Signed)
Physical therapy is recommending home with home health/physical therapy. Wife is declining home health at this time. States that Dr. Marry Guan has arranged outpatient therapy at [facility] Discharge to home today per Dr. Tyler Deis RN MSN CCM Care Management [phone]

## 2018-11-16 NOTE — Plan of Care (Signed)
  Problem: Education: Goal: Knowledge of General Education information will improve Description Including pain rating scale, medication(s)/side effects and non-pharmacologic comfort measures Outcome: Progressing   Problem: Health Behavior/Discharge Planning: Goal: Ability to manage health-related needs will improve Outcome: Progressing   Problem: Clinical Measurements: Goal: Ability to maintain clinical measurements within normal limits will improve Outcome: Progressing   Problem: Nutrition: Goal: Adequate nutrition will be maintained Outcome: Progressing   Problem: Elimination: Goal: Will not experience complications related to bowel motility Outcome: Progressing Goal: Will not experience complications related to urinary retention Outcome: Progressing   Problem: Safety: Goal: Ability to remain free from injury will improve Outcome: Progressing   Problem: Skin Integrity: Goal: Risk for impaired skin integrity will decrease Outcome: Progressing  Chronic foley/ pt sees  urology

## 2018-11-16 NOTE — Clinical Social Work Note (Signed)
CSW notified by MD that patient and wife would like to speak to CSW regarding DC plan. CSW met with patient and wife at bedside. CSW explained that PT has recommended home with home health and that usually insurance [facility]) will not pay for SNF if that is the recommendation but we could try to get authorization anyways. CSW explained that other options would be to pay for SNF privately or to go home with home health. Wife states that she will not pay privately for facility because she has insurance for this reason. CSW apologized and again offered to try insurance. Wife again refused stating " I am taking him home" CSW asked patient and wife about home health preference and wife states that she will take patient to [facility] outpatient therapy because Dr. Marry Guan has already set that up and she does not want home health. CSW notified RNCM of above. CSW signing off.   [place], [place]

## 2018-11-17 LAB — CULTURE, BLOOD (ROUTINE X 2)
Culture: NO GROWTH
Culture: NO GROWTH

## 2018-11-22 DIAGNOSIS — Z436 Encounter for attention to other artificial openings of urinary tract: Secondary | ICD-10-CM | POA: Insufficient documentation

## 2018-11-23 ENCOUNTER — Inpatient Hospital Stay: Payer: Medicare Other | Attending: Oncology

## 2018-11-23 ENCOUNTER — Inpatient Hospital Stay: Payer: Medicare Other

## 2018-11-23 ENCOUNTER — Encounter: Payer: Self-pay | Admitting: Oncology

## 2018-11-23 ENCOUNTER — Inpatient Hospital Stay (HOSPITAL_BASED_OUTPATIENT_CLINIC_OR_DEPARTMENT_OTHER): Payer: Medicare Other | Admitting: Oncology

## 2018-11-23 VITALS — BP 122/76 | HR 102 | Temp 98.1°F | Resp 18 | Wt 175.0 lb

## 2018-11-23 DIAGNOSIS — R7989 Other specified abnormal findings of blood chemistry: Secondary | ICD-10-CM

## 2018-11-23 DIAGNOSIS — Z87440 Personal history of urinary (tract) infections: Secondary | ICD-10-CM | POA: Diagnosis not present

## 2018-11-23 DIAGNOSIS — C678 Malignant neoplasm of overlapping sites of bladder: Secondary | ICD-10-CM | POA: Insufficient documentation

## 2018-11-23 DIAGNOSIS — Z9221 Personal history of antineoplastic chemotherapy: Secondary | ICD-10-CM | POA: Insufficient documentation

## 2018-11-23 DIAGNOSIS — Z5112 Encounter for antineoplastic immunotherapy: Secondary | ICD-10-CM | POA: Insufficient documentation

## 2018-11-23 DIAGNOSIS — E538 Deficiency of other specified B group vitamins: Secondary | ICD-10-CM | POA: Diagnosis not present

## 2018-11-23 DIAGNOSIS — C61 Malignant neoplasm of prostate: Secondary | ICD-10-CM

## 2018-11-23 DIAGNOSIS — C689 Malignant neoplasm of urinary organ, unspecified: Secondary | ICD-10-CM

## 2018-11-23 DIAGNOSIS — D509 Iron deficiency anemia, unspecified: Secondary | ICD-10-CM

## 2018-11-23 DIAGNOSIS — Z923 Personal history of irradiation: Secondary | ICD-10-CM | POA: Diagnosis not present

## 2018-11-23 DIAGNOSIS — N179 Acute kidney failure, unspecified: Secondary | ICD-10-CM | POA: Diagnosis not present

## 2018-11-23 LAB — CBC WITH DIFFERENTIAL/PLATELET
Abs Immature Granulocytes: 0.08 10*3/uL — ABNORMAL HIGH (ref 0.00–0.07)
Basophils Absolute: 0.1 10*3/uL (ref 0.0–0.1)
Basophils Relative: 1 %
Eosinophils Absolute: 0.1 10*3/uL (ref 0.0–0.5)
Eosinophils Relative: 1 %
HCT: 37.9 % — ABNORMAL LOW (ref 39.0–52.0)
Hemoglobin: 11.9 g/dL — ABNORMAL LOW (ref 13.0–17.0)
Immature Granulocytes: 1 %
LYMPHS ABS: 1.7 10*3/uL (ref 0.7–4.0)
LYMPHS PCT: 13 %
MCH: 29.4 pg (ref 26.0–34.0)
MCHC: 31.4 g/dL (ref 30.0–36.0)
MCV: 93.6 fL (ref 80.0–100.0)
Monocytes Absolute: 0.9 10*3/uL (ref 0.1–1.0)
Monocytes Relative: 7 %
Neutro Abs: 10.2 10*3/uL — ABNORMAL HIGH (ref 1.7–7.7)
Neutrophils Relative %: 77 %
Platelets: 275 10*3/uL (ref 150–400)
RBC: 4.05 MIL/uL — ABNORMAL LOW (ref 4.22–5.81)
RDW: 17.2 % — ABNORMAL HIGH (ref 11.5–15.5)
WBC: 13.1 10*3/uL — ABNORMAL HIGH (ref 4.0–10.5)
nRBC: 0 % (ref 0.0–0.2)

## 2018-11-23 LAB — COMPREHENSIVE METABOLIC PANEL
ALT: 27 U/L (ref 0–44)
AST: 22 U/L (ref 15–41)
Albumin: 3.4 g/dL — ABNORMAL LOW (ref 3.5–5.0)
Alkaline Phosphatase: 77 U/L (ref 38–126)
Anion gap: 9 (ref 5–15)
BUN: 21 mg/dL (ref 8–23)
CHLORIDE: 104 mmol/L (ref 98–111)
CO2: 26 mmol/L (ref 22–32)
CREATININE: 1.25 mg/dL — AB (ref 0.61–1.24)
Calcium: 9.3 mg/dL (ref 8.9–10.3)
GFR calc Af Amer: >60 mL/min (ref >60)
GFR calc non Af Amer: 54 mL/min — ABNORMAL LOW (ref >60)
Glucose, Bld: 110 mg/dL — ABNORMAL HIGH (ref 70–99)
Potassium: 3.7 mmol/L (ref 3.5–5.1)
Sodium: 139 mmol/L (ref 135–145)
Total Bilirubin: 0.5 mg/dL (ref 0.3–1.2)
Total Protein: 7.6 g/dL (ref 6.5–8.1)

## 2018-11-23 MED ORDER — SODIUM CHLORIDE 0.9 % IV SOLN
1200.0000 mg | Freq: Once | INTRAVENOUS | Status: AC
  Administered 2018-11-23: 1200 mg via INTRAVENOUS
  Filled 2018-11-23: qty 20

## 2018-11-23 MED ORDER — SODIUM CHLORIDE 0.9 % IV SOLN
Freq: Once | INTRAVENOUS | Status: AC
  Administered 2018-11-23: 14:00:00 via INTRAVENOUS
  Filled 2018-11-23: qty 250

## 2018-11-23 MED ORDER — SODIUM CHLORIDE 0.9% FLUSH
10.0000 mL | Freq: Once | INTRAVENOUS | Status: AC
  Administered 2018-11-23: 10 mL via INTRAVENOUS
  Filled 2018-11-23: qty 10

## 2018-11-23 MED ORDER — CYANOCOBALAMIN 1000 MCG/ML IJ SOLN
1000.0000 ug | INTRAMUSCULAR | Status: DC
  Administered 2018-11-23: 1000 ug via INTRAMUSCULAR
  Filled 2018-11-23: qty 1

## 2018-11-23 MED ORDER — HEPARIN SOD (PORK) LOCK FLUSH 100 UNIT/ML IV SOLN
500.0000 [IU] | Freq: Once | INTRAVENOUS | Status: AC
  Administered 2018-11-23: 500 [IU] via INTRAVENOUS
  Filled 2018-11-23: qty 5

## 2018-11-23 NOTE — Progress Notes (Signed)
Here for possible tecentriq. Recent cystoscopy with stent placement. Foley cath intact and draining clear yellow urine. No blood in urine. Has intermittent bladder spasms .Appetite is up and down. Denies nausea. Regular BM's. Mild pedal edema.

## 2018-11-23 NOTE — Progress Notes (Signed)
Dr [name] aware of HR, proceed with tecentriq today

## 2018-11-26 DIAGNOSIS — C678 Malignant neoplasm of overlapping sites of bladder: Secondary | ICD-10-CM

## 2018-11-26 NOTE — Progress Notes (Signed)
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 For his prostate cancer he has received IM RT in the past. He has been seeing Dr. Erlene Quan in the past for his recurrent UTIs as well as hematuria. CT scan in July 2018 showed bladder wall thickening with perivascular edema and inflammation in the right ureter greater than the left. Findings were thought to be due to pyelonephritis at that time. He underwent cystoscopy on 12/14/2017 which showed abnormal looking prostate with necrotic material lining the entire surface area. Diffuse copious debris within the bladder appearing to be erythematous without discrete bladder tumor but visualization was poor. He was then admitted to the hospital on 12/26/2017 with symptoms of UTI and sepsis as well as new moderate bilateral hydronephrosis.  CT abdomen and pelvis with contrast on 12/20/2017 again showed market bile bladder wall thickening with perivesicular edema. Within the lumen of the bladder there is a 93.9 cm soft tissue attenuating filling defect. This is indeterminate and could represent an area of blood clot versus urothelial lesion.  He underwent repeat cystoscopy with bilateral pyelogram and ureteral stent placement as well as TURBT and TURP. He was found to have a massive tumor involving the majority of the bladder with grossly necrotic and calcified material. There appeared to  be multifocal disease with large burden of the left anterior bladder wall extending posteriorly as well as adjacent to the bladder neck and beyond the right hemitrigone. Very little normal recognizable bladder mucosa remaining.   Biopsy from TURBT and TURP showed: High-grade urothelial carcinoma with extensive necrosisinvolving both the bladder and the prostate.CT chest did not reveal any evidence of metastatic disease. He was also not found to have any regional adenopathy on CT abdomen  Patient was seen by Dr. Erlene Quan and was not deemed to be a surgical candidate. He has been referred to [place] for definitive treatment options.  Patient lives with his wife at home and ambulates with a cane. He does need assistance with his ADLs to some extent. He has not had any falls and he denies any changes in his appetite or unintentional weight loss. Denies any pain. Reports some fatigue and occasional problems with constipation. He has had 3 hospitalizations last year for urinary tract infections and currently has a chronic Foley for the last 1 month  Dr. Erlene Quan performed interval TURBT on 01/15/18 and was able to debulk tumor as much as possible to reduce tumor burden  Patient received 5 cycles ofcarboplatin/ gemcitabine 2 weeks on and 1 week off ending 04/26/18.Patient did receive radiation for 10 days during cycle 2 of treatment. Given patients age, co-morbidities and frailty- he was not a cisplatin candidate.6th cycle not given due to fall and hip fracture  Patient had a repeat cystoscopy in September 2019 which showed no obvious tumor bladder but it did have a shaggy necrotic appearance at the bladder neck. Prostatic fossa was grossly abnormal necrotic and irregular without papillary change. Bladder neck was biopsied and showed residual high-grade urothelial carcinoma. Muscularis  propria was not seen in that specimen.Due to evidence of recurrent/residual disease patient was started on  Tecentriq on 07/26/2018  Interval history-he was recently admitted for urinary tract infection in the hospital and required stent exchange as well.  He has completed his course of antibiotics.  He feels a little weaker than his usual self especially since his 2 rounds of hospitalization in the last 3 months  ECOG PS- 2 Pain scale- 0 Opioid associated constipation- no  Review of systems- Review of Systems  Constitutional: Positive for malaise/fatigue. Negative for chills, fever and weight loss.  HENT: Negative for congestion, ear discharge and nosebleeds.   Eyes: Negative for blurred vision.  Respiratory: Negative for cough, hemoptysis, sputum production, shortness of breath and wheezing.   Cardiovascular: Negative for chest pain, palpitations, orthopnea and claudication.  Gastrointestinal: Negative for abdominal pain, blood in stool, constipation, diarrhea, heartburn, melena, nausea and vomiting.  Genitourinary: Negative for dysuria, flank pain, frequency, hematuria and urgency.  Musculoskeletal: Negative for back pain, joint pain and myalgias.  Skin: Negative for rash.  Neurological: Negative for dizziness, tingling, focal weakness, seizures, weakness and headaches.  Endo/Heme/Allergies: Does not bruise/bleed easily.  Psychiatric/Behavioral: Negative for depression and suicidal ideas. The patient does not have insomnia.       No Known Allergies   Past Medical History:  Diagnosis Date  . Anxiety   . Arthritis   . Bladder cancer ([place])   . Dysrhythmia 07/2018   history of atrial flutter that worsens with anxiety  . Femur fracture, right ([place]) 05/01/2018  . GERD (gastroesophageal reflux disease)   . History of recent blood transfusion 05/2018  . Hypertension   . Iron deficiency anemia 09/14/2018  . Prostate cancer ([place]) 07/2018   cancer growing in prostate but not prostate cancer, it is from the bladder  . Umbilical hernia 61/9509  . Urinary retention 2019   foley catheter place  11/2017  . UTI (urinary tract infection) 2019   frequent UTI's over last year  . Wound eschar of foot 07/2018   left heal getting wrapped and requiring antibiotic cream. cracks open with weight bearing.     Past Surgical History:  Procedure Laterality Date  . CARPAL TUNNEL RELEASE Right   . CHOLECYSTECTOMY  2004  . CYSTOGRAM  07/18/2018   Procedure: CYSTOGRAM;  Surgeon: [name], MD;  Location: [facility];  Service: Urology;;  . Consuela Mimes W/ RETROGRADES Bilateral 07/18/2018   Procedure: CYSTOSCOPY WITH RETROGRADE PYELOGRAM;  Surgeon: [name], MD;  Location: [facility];  Service: Urology;  Laterality: Bilateral;  . CYSTOSCOPY W/ URETERAL STENT PLACEMENT Bilateral 12/27/2017   Procedure: CYSTOSCOPY WITH RETROGRADE PYELOGRAM/URETERAL STENT PLACEMENT;  Surgeon: [name], MD;  Location: [facility];  Service: Urology;  Laterality: Bilateral;  . CYSTOSCOPY W/ URETERAL STENT PLACEMENT Bilateral 07/18/2018   Procedure: CYSTOSCOPY WITH STENT REPLACEMENT (exchange);  Surgeon: [name], MD;  Location: [facility];  Service: Urology;  Laterality: Bilateral;  . CYSTOSCOPY WITH STENT PLACEMENT Bilateral 11/12/2018   Procedure: [place] WITH STENT Exchange;  Surgeon: [name], MD;  Location: [facility];  Service: Urology;  Laterality: Bilateral;  . INTRAMEDULLARY (IM) NAIL INTERTROCHANTERIC Right 05/02/2018   Procedure: INTRAMEDULLARY (IM) NAIL INTERTROCHANTRIC;  Surgeon: [name], MD;  Location: [facility];  Service: Orthopedics;  Laterality: Right;  . LEG TENDON SURGERY Right 1958  . PORTA CATH INSERTION N/A 01/22/2018   Procedure: PORTA CATH INSERTION;  Surgeon: [name], MD;  Location: [facility];  Service: Cardiovascular;  Laterality:  N/A;  . TRANSURETHRAL RESECTION OF BLADDER TUMOR N/A 12/27/2017   Procedure: TRANSURETHRAL RESECTION OF BLADDER TUMOR (TURBT);  Surgeon: [name], MD;  Location: [facility];  Service: Urology;  Laterality: N/A;  .  TRANSURETHRAL RESECTION OF BLADDER TUMOR N/A 01/15/2018   Procedure: TRANSURETHRAL RESECTION OF BLADDER TUMOR (TURBT);  Surgeon: [name], MD;  Location: [facility];  Service: Urology;  Laterality: N/A;  Need 2 hrs for this case please  . TRANSURETHRAL RESECTION OF BLADDER TUMOR N/A 07/18/2018   Procedure: TRANSURETHRAL RESECTION OF BLADDER TUMOR (TURBT);  Surgeon: [name], MD;  Location: [facility];  Service: Urology;  Laterality: N/A;    Social History   Socioeconomic History  . Marital status: Married    Spouse name: ruth  . Number of children: Not on file  . Years of education: Not on file  . Highest education level: Not on file  Occupational History  . Occupation: retired    Comment: Therapist, [employer]  Social Needs  . Financial resource strain: Not on file  . Food insecurity:    Worry: Not on file    Inability: Not on file  . Transportation needs:    Medical: Not on file    Non-medical: Not on file  Tobacco Use  . Smoking status: Never Smoker  . Smokeless tobacco: Never Used  Substance and Sexual Activity  . Alcohol use: No    Alcohol/week: 0.0 standard drinks  . Drug use: No  . Sexual activity: Not Currently  Lifestyle  . Physical activity:    Days per week: Not on file    Minutes per session: Not on file  . Stress: Not on file  Relationships  . Social connections:    Talks on phone: Not on file    Gets together: Not on file    Attends religious service: Not on file    Active member of club or organization: Not on file    Attends meetings of clubs or organizations: Not on file    Relationship status: Not on file  . Intimate partner violence:    Fear of current or ex partner: Not on file    Emotionally abused: Not on file    Physically abused: Not on file    Forced sexual activity: Not on file  Other Topics Concern  . Not on file  Social History Narrative  . Not on file    Family History  Problem Relation Age of Onset  . Cancer Mother   .  Chronic Renal Failure Mother   . Heart disease Father      Current Outpatient Medications:  .  acetaminophen (TYLENOL) 500 MG tablet, Take 1,000 mg by mouth every 6 (six) hours as needed for moderate pain or headache. , Disp: , Rfl:  .  cetirizine (ZYRTEC) 10 MG tablet, Take 10 mg by mouth daily as needed for allergies. , Disp: , Rfl:  .  CRANBERRY PO, Take 1 capsule by mouth 2 (two) times daily. , Disp: , Rfl:  .  ferrous sulfate 325 (65 FE) MG tablet, Take 1 tablet (325 mg total) by mouth 2 (two) times daily with a meal., Disp: , Rfl: 3 .  Multiple Vitamin (MULTIVITAMIN WITH MINERALS) TABS tablet, Take 1 tablet by mouth daily., Disp: , Rfl:  .  MYRBETRIQ 50 MG TB24 tablet, TAKE 1 TABLET DAILY, Disp: 30 tablet, Rfl: 3 .  nystatin (NYAMYC) powder, Apply topically 2 (two) times daily., Disp: 60 g, Rfl: 3 .  polyethylene glycol  powder (MIRALAX) powder, Take 17 g by mouth daily as needed. Can increase to 3 times a day as needed for constipation but hold medication if has diarrhea, Disp: 255 g, Rfl: 0 .  vitamin B-12 (CYANOCOBALAMIN) 500 MCG tablet, Take 500 mcg by mouth daily., Disp: , Rfl:  .  zinc oxide 20 % ointment, Apply 1 application topically as needed for irritation., Disp: , Rfl:   Physical exam:  Vitals:   11/23/18 1352  BP: 122/76  Pulse: (!) 102  Resp: 18  Temp: 98.1 F (36.7 C)  Weight: 175 lb (79.4 kg)   Physical Exam Constitutional:      Comments: Frail elderly gentleman sitting in a wheelchair.  Appears in no acute distress  HENT:     Head: Normocephalic and atraumatic.  Eyes:     Pupils: Pupils are equal, round, and reactive to light.  Neck:     Musculoskeletal: Normal range of motion.  Cardiovascular:     Rate and Rhythm: Normal rate and regular rhythm.     Heart sounds: Normal heart sounds.  Pulmonary:     Effort: Pulmonary effort is normal.     Breath sounds: Normal breath sounds.  Abdominal:     General: Bowel sounds are normal.     Palpations: Abdomen is  soft.     Comments: Foley catheter in place  Skin:    General: Skin is warm and dry.  Neurological:     Mental Status: He is alert and oriented to person, place, and time.      CMP Latest Ref Rng & Units 11/23/2018  Glucose 70 - 99 mg/dL 110(H)  BUN 8 - 23 mg/dL 21  Creatinine 0.61 - 1.24 mg/dL 1.25(H)  Sodium 135 - 145 mmol/L 139  Potassium 3.5 - 5.1 mmol/L 3.7  Chloride 98 - 111 mmol/L 104  CO2 22 - 32 mmol/L 26  Calcium 8.9 - 10.3 mg/dL 9.3  Total Protein 6.5 - 8.1 g/dL 7.6  Total Bilirubin 0.3 - 1.2 mg/dL 0.5  Alkaline Phos 38 - 126 U/L 77  AST 15 - 41 U/L 22  ALT 0 - 44 U/L 27   CBC Latest Ref Rng & Units 11/23/2018  WBC 4.0 - 10.5 K/uL 13.1(H)  Hemoglobin 13.0 - 17.0 g/dL 11.9(L)  Hematocrit 39.0 - 52.0 % 37.9(L)  Platelets 150 - 400 K/uL 275    No images are attached to the encounter.  Dg Chest Port 1 View  Result Date: 11/12/2018 CLINICAL DATA:  Shortness of breath and fever. Recent anesthesia for urologic procedure. EXAM: PORTABLE CHEST 1 VIEW COMPARISON:  05/01/2018; chest CT-10/22/2018 FINDINGS: Grossly unchanged cardiac silhouette and mediastinal contours with atherosclerotic plaque within the thoracic aorta. Stable positioning of support apparatus. No focal airspace opacities. No pleural effusion or pneumothorax. No evidence of edema. No acute osseous abnormalities. Moderate to severe degenerative change of the bilateral glenohumeral joints, right greater than left, incompletely evaluated. IMPRESSION: No acute cardiopulmonary disease. Specifically, no evidence of pneumonia and/or aspiration. Electronically Signed   By: Sandi Mariscal M.D.   On: 11/12/2018 19:21     Assessment and plan- Patient is a 84 y.o. 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  He is here for on treatment assessment prior to cycle 6  Counts okay to  proceed with cycle 6 of Tecentriq today.  He did have some AKI during his recent admission  which improved with IV fluids but is not yet back to baseline.  I will therefore give him 1 more liter of IV fluids today.  I will see him back in 3 weeks time with CBC CMP for cycle 7   Visit Diagnosis 1. Encounter for monoclonal antibody treatment for malignancy   2. Malignant neoplasm of overlapping sites of bladder [facility])      Dr. Randa Evens, MD, MPH [facility] [phone] 11/26/2018 2:06 PM

## 2018-12-13 ENCOUNTER — Encounter: Payer: Self-pay | Admitting: Urology

## 2018-12-13 ENCOUNTER — Ambulatory Visit (INDEPENDENT_AMBULATORY_CARE_PROVIDER_SITE_OTHER): Payer: Medicare Other | Admitting: Urology

## 2018-12-13 ENCOUNTER — Other Ambulatory Visit: Payer: Self-pay | Admitting: *Deleted

## 2018-12-13 VITALS — BP 113/77 | HR 111 | Ht 71.0 in | Wt 185.0 lb

## 2018-12-13 DIAGNOSIS — C689 Malignant neoplasm of urinary organ, unspecified: Secondary | ICD-10-CM

## 2018-12-13 DIAGNOSIS — R339 Retention of urine, unspecified: Secondary | ICD-10-CM | POA: Diagnosis not present

## 2018-12-13 DIAGNOSIS — C678 Malignant neoplasm of overlapping sites of bladder: Secondary | ICD-10-CM

## 2018-12-13 NOTE — Progress Notes (Signed)
Cath Change/ Replacement  Patient is present today for a catheter change due to urinary retention.  61ml of water was removed from the balloon, a 16FR coude foley cath was removed with out difficulty.  Patient was cleaned and prepped in a sterile fashion with betadine and 2% lidocaine jelly was instilled into the urethra. A 16 FR coude foley cath was replaced into the bladder no complications were noted Urine return was noted 53ml and urine was light blood tinged in color. The balloon was filled with 34ml of sterile water. A night bag was attached for drainage.  A night bag was also given to the patient and patient was given instruction on how to change from one bag to another. Patient was given proper instruction on catheter care.    Preformed by: Shawnie Dapper, CMA

## 2018-12-13 NOTE — Progress Notes (Signed)
12/13/2018  1:43 PM   Vincent Poole [birth year] [id number]  Referring provider: [name], MD [facility] [place], [place] [address]  Chief Complaint  Patient presents with  . Urinary Retention    foley exchange    HPI: Vincent Poole is a 84 y.o. 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 He presents today for evaluation and management of urinary retention and foley exchange. He is accompanied by his wife.  Patient was a bit shaky and he reports that he tends to be nervous around doctors but can control his movements if he focuses on it.  Malignant neoplasm of overlapping sites of bladder [facility]) He was initially diagnosed on 12/27/2017 at which time indwelling ureteral stents were placed for bilateral ureteral obstruction. He underwent TURBT on 01/15/18 for the purpose of debulking.  He received 5 cycles of carboplatin/ gemcitabine 2 weeks on and 1 week off ending 04/26/18. Patient did receive radiation for 10 days during cycle 2 of treatment. Given patients age, co-morbidities and frailty- he was not a cisplatin candidate.  He has had a medical setback with a fall and right hip fracture in 04/2018.  He did re[facility] a [facility], but he is now living at home.     He underwent cysto, RTG's, ureteral stent exchange, bladder neck biopsy and cystogram on 07/18/2018.   Biopsy positive for recurrent high grade urolthelial carcinoma.    CT Abd/Pelvis w contrast (10/22/2018) revealed increasing let hydronephrosis with stent in adequate position, improved right hydronephrosis. Diffuse bladder wall thickening. No evidence of lymphadenopathy or metastatic disease.  Cysto with stent placement on 11/12/2018 with no obvious tumor and bladder but does have shaggy necrotic appearance especially at the bladder neck. Prostatic fossa is grossly abnormal, necrotic and  irregular without any papillary change.  Patient remains on Tecentriq (started by Dr. [name] in 07/2018), Myrbetriq and cranberry pills.  Patient and wife report that the patient is doing well.  PMH: Past Medical History:  Diagnosis Date  . Anxiety   . Arthritis   . Bladder cancer ([place])   . Dysrhythmia 07/2018   history of atrial flutter that worsens with anxiety  . Femur fracture, right ([place]) 05/01/2018  . GERD (gastroesophageal reflux disease)   . History of recent blood transfusion 05/2018  . Hypertension   . Iron deficiency anemia 09/14/2018  . Prostate cancer ([place]) 07/2018   cancer growing in prostate but not prostate cancer, it is from the bladder  . Umbilical hernia 74/1638  . Urinary retention 2019   foley catheter place 11/2017  . UTI (urinary tract infection) 2019   frequent UTI's over last year  . Wound eschar of foot 07/2018   left heal getting wrapped and requiring antibiotic cream. cracks open with weight bearing.    Surgical History: Past Surgical History:  Procedure Laterality Date  . CARPAL TUNNEL RELEASE Right   . CHOLECYSTECTOMY  2004  . CYSTOGRAM  07/18/2018   Procedure: CYSTOGRAM;  Surgeon: [name], MD;  Location: [facility];  Service: Urology;;  . Consuela Mimes W/ RETROGRADES Bilateral 07/18/2018   Procedure: CYSTOSCOPY WITH RETROGRADE PYELOGRAM;  Surgeon: [name], MD;  Location: [facility];  Service: Urology;  Laterality: Bilateral;  . CYSTOSCOPY W/ URETERAL STENT PLACEMENT Bilateral 12/27/2017   Procedure: CYSTOSCOPY WITH RETROGRADE PYELOGRAM/URETERAL STENT PLACEMENT;  Surgeon: [name], MD;  Location: [facility];  Service: Urology;  Laterality: Bilateral;  .  CYSTOSCOPY W/ URETERAL STENT PLACEMENT Bilateral 07/18/2018   Procedure: CYSTOSCOPY WITH STENT REPLACEMENT (exchange);  Surgeon: [name], MD;  Location: [facility];  Service: Urology;  Laterality: Bilateral;  . CYSTOSCOPY WITH STENT PLACEMENT Bilateral 11/12/2018   Procedure: [place]  WITH STENT Exchange;  Surgeon: [name], MD;  Location: [facility];  Service: Urology;  Laterality: Bilateral;  . INTRAMEDULLARY (IM) NAIL INTERTROCHANTERIC Right 05/02/2018   Procedure: INTRAMEDULLARY (IM) NAIL INTERTROCHANTRIC;  Surgeon: [name], MD;  Location: [facility];  Service: Orthopedics;  Laterality: Right;  . LEG TENDON SURGERY Right 1958  . PORTA CATH INSERTION N/A 01/22/2018   Procedure: PORTA CATH INSERTION;  Surgeon: [name], MD;  Location: [facility];  Service: Cardiovascular;  Laterality: N/A;  . TRANSURETHRAL RESECTION OF BLADDER TUMOR N/A 12/27/2017   Procedure: TRANSURETHRAL RESECTION OF BLADDER TUMOR (TURBT);  Surgeon: [name], MD;  Location: [facility];  Service: Urology;  Laterality: N/A;  . TRANSURETHRAL RESECTION OF BLADDER TUMOR N/A 01/15/2018   Procedure: TRANSURETHRAL RESECTION OF BLADDER TUMOR (TURBT);  Surgeon: [name], MD;  Location: [facility];  Service: Urology;  Laterality: N/A;  Need 2 hrs for this case please  . TRANSURETHRAL RESECTION OF BLADDER TUMOR N/A 07/18/2018   Procedure: TRANSURETHRAL RESECTION OF BLADDER TUMOR (TURBT);  Surgeon: [name], MD;  Location: [facility];  Service: Urology;  Laterality: N/A;    Home Medications:  Allergies as of 12/13/2018   No Known Allergies     Medication List       Accurate as of December 13, 2018  1:43 PM. Always use your most recent med list.        acetaminophen 500 MG tablet Commonly known as:  TYLENOL Take 1,000 mg by mouth every 6 (six) hours as needed for moderate pain or headache.   cetirizine 10 MG tablet Commonly known as:  ZYRTEC Take 10 mg by mouth daily as needed for allergies.   CRANBERRY PO Take 1 capsule by mouth 2 (two) times daily.   ferrous sulfate 325 (65 FE) MG tablet Take 1 tablet (325 mg total) by mouth 2 (two) times daily with a meal.   multivitamin with minerals Tabs tablet Take 1 tablet by mouth daily.   MYRBETRIQ 50 MG Tb24  tablet Generic drug:  mirabegron ER TAKE 1 TABLET DAILY   nystatin powder Commonly known as:  NYAMYC Apply topically 2 (two) times daily.   polyethylene glycol powder powder Commonly known as:  MIRALAX Take 17 g by mouth daily as needed. Can increase to 3 times a day as needed for constipation but hold medication if has diarrhea   vitamin B-12 500 MCG tablet Commonly known as:  CYANOCOBALAMIN Take 500 mcg by mouth daily.   zinc oxide 20 % ointment Apply 1 application topically as needed for irritation.      Allergies:  No Known Allergies  Family History: Family History  Problem Relation Age of Onset  . Cancer Mother   . Chronic Renal Failure Mother   . Heart disease Father    Social History:  reports that he has never smoked. He has never used smokeless tobacco. He reports that he does not drink alcohol or use drugs.  ROS: UROLOGY Frequent Urination?: No Hard to postpone urination?: No Burning/pain with urination?: No Get up at night to urinate?: No Leakage of urine?: No Urine stream starts and stops?: No Trouble starting stream?: No Do you have to strain to urinate?: No Blood in urine?: Yes Urinary  tract infection?: No Sexually transmitted disease?: No Injury to kidneys or bladder?: No Painful intercourse?: No Weak stream?: No Erection problems?: No Penile pain?: No  Gastrointestinal Nausea?: No Vomiting?: No Indigestion/heartburn?: No Diarrhea?: No Constipation?: No  Constitutional Fever: No Night sweats?: No Weight loss?: No Fatigue?: No  Skin Skin rash/lesions?: No Itching?: No  Eyes Blurred vision?: No Double vision?: No  Ears/Nose/Throat Sore throat?: No Sinus problems?: No  Hematologic/Lymphatic Swollen glands?: No Easy bruising?: No  Cardiovascular Leg swelling?: No Chest pain?: No  Respiratory Cough?: No Shortness of breath?: No  Endocrine Excessive thirst?: No  Musculoskeletal Back pain?: No Joint pain?:  No  Neurological Headaches?: No Dizziness?: No  Psychologic Depression?: No Anxiety?: No  Physical Exam: BP 113/77 (BP Location: Left Arm, Patient Position: Sitting)   Pulse (!) 111   Ht 5\' 11"  (1.803 m)   Wt 185 lb (83.9 kg)   BMI 25.80 kg/m   Constitutional: Well nourished. Alert and oriented, No acute distress.  Smells strongly of urine and yeast.   Head: Normocephalic and Atraumatic Respiratory: Normal respiratory effort, no increased work of breathing. GU: No CVA tenderness.  No bladder fullness or masses.  Patient with uncircumcised phallus.   Foreskin easily retracted  Urethral meatus is patent.  No penile discharge. No penile lesions or rashes. Scrotum without lesions, cysts, rashes and/or edema.  Testicles are located scrotally bilaterally. No masses are appreciated in the testicles. Left and right epididymis are normal.  Erythematous, scaling plaques on medial thighs, inguinal folds and pubic area with areas of clearing with the large plaques with crusted Nystatin in the creases Rectal: deferred.  Skin: No rashes, bruises or suspicious lesions. Neurologic: Grossly intact, no focal deficits, moving all 4 extremities. Psychiatric: Normal mood and affect.  Laboratory Data: Lab Results  Component Value Date   WBC 13.1 (H) 11/23/2018   HGB 11.9 (L) 11/23/2018   HCT 37.9 (L) 11/23/2018   MCV 93.6 11/23/2018   PLT 275 11/23/2018   Lab Results  Component Value Date   CREATININE 1.25 (H) 11/23/2018   Lab Results  Component Value Date   PSA 0.02 12/22/2016   PSA 0.05 12/24/2015   PSA 0.05 05/27/2015   Pertinent Imaging CLINICAL DATA:  Locally advanced high-grade urothelial carcinoma, status post therapy with residual/recurrent tumor.  EXAM: CT CHEST, ABDOMEN, AND PELVIS WITH CONTRAST  TECHNIQUE: Multidetector CT imaging of the chest, abdomen and pelvis was performed following the standard protocol during bolus administration of intravenous  contrast.  CONTRAST:  116mL ISOVUE-300 IOPAMIDOL (ISOVUE-300) INJECTION 61%  COMPARISON:  07/12/2018.  FINDINGS: CT CHEST FINDINGS  Cardiovascular: Right IJ Port-A-Cath terminates in the SVC. Atherosclerotic calcification of the aorta and coronary arteries. Heart size normal. No pericardial effusion.  Mediastinum/Nodes: No pathologically enlarged mediastinal, hilar or axillary lymph nodes. Esophagus is grossly unremarkable.  Lungs/Pleura: Favor scarring in the apical segment right upper lobe. Mild basilar bronchiectasis. Bilateral pleural calcifications. Lungs are otherwise clear. No pleural fluid. Airway is unremarkable.  Musculoskeletal: Large area of lucency in the T10 vertebral body is unchanged. Degenerative changes in the spine and shoulders.  CT ABDOMEN PELVIS FINDINGS  Hepatobiliary: Liver is unremarkable. Cholecystectomy. No biliary ductal dilatation.  Pancreas: Calcifications are seen in the pancreas. No ductal dilatation.  Spleen: Negative.  Adrenals/Urinary Tract: Adrenal glands are unremarkable. Low-attenuation lesions in the kidneys measure up to 2.8 cm on the right and are likely cysts. Double-J ureteral stents are in place bilaterally. Right ureter is decompressed. There is increasing dilatation of the  left intrarenal collecting system and left ureter when compared with 07/12/2018. Small amount of air in the left ureter is presumably iatrogenic. Foley catheter is seen in a low volume bladder which appears thick-walled.  Stomach/Bowel: Stomach is unremarkable. Duodenal diverticula are incidentally noted. Small bowel and appendix are otherwise unremarkable. Stool is seen throughout the colon, indicative of constipation.  Vascular/Lymphatic: Atherosclerotic calcification throughout the arterial vasculature without abdominal aortic aneurysm. Retroperitoneal lymph nodes are not enlarged by CT size criteria. No pathologically enlarged lymph  nodes.  Reproductive: Prostate is visualized.  Other: Mild nonspecific presacral edema. No free fluid. Mesenteries and peritoneum are otherwise unremarkable.  Musculoskeletal: Postoperative changes in the right proximal femur. Advanced degenerative changes in both hips, right greater than left. Degenerative changes in the spine. Lucency within the L2 vertebral body, unchanged. No worrisome lytic or sclerotic lesions.  IMPRESSION: 1. Increasing left hydronephrosis with a double-J left ureteral stent in place. Right-sided hydronephrosis has improved significantly in the interval. Diffuse bladder wall thickening. 2. No evidence of metastatic disease. 3. 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 4. Asbestos related pleural disease. 5. Aortic atherosclerosis (ICD10-170.0). Coronary artery calcification.   Electronically Signed   By: Lorin Picket M.D.   On: 10/22/2018 13:29  I have personally reviewed all the images today.  Assessment & Plan:   1. Malignant neoplasm of overlapping sites of bladder ([place])  - s/p IMRT and abbreviated chemotherapy course  - No evidence of metastatic disease  - Local advancement   2. Bilateral ureteral obstruction  - managed with bilateral ureteral indwelling stents  - Evidence of persistent and increasing left-sided hydronephrosis (likely secondary to stent obstruction), improved on right seen on CT Abd/Pelvis w Contrast (10/22/2018)  3. Contracted bladder  - Managed with a Coude catheter and monthly exchanges  Zara Council, PA-C [place] [address] [place], [place] [address] [phone]  I, Temidayo Atanda-[place]e , am acting as a scribe for [facility], PA-C  I have reviewed the above documentation for accuracy and completeness, and I agree with the above.    Zara Council, PA-C

## 2018-12-13 NOTE — Progress Notes (Signed)
UROLOGY PROCEDURE NOTE  Patient is an 84 year old male followed by Dr. Erlene Quan for locally advanced muscle invasive bladder cancer treated with tri-modal therapy.  He has bilateral chronic ureteral stents as well as a chronic urethral Foley.  He has a known contracted, <50cc bladder.  His chronic Foley was changed in our clinic earlier today and he re-presented this afternoon with severe leakage around the catheter and discomfort.  I was unable to place a 16 or 20 [place] coud Foley and met resistance at the proximal urethra.  He was prepped and draped in standard sterile fashion.  A flexible cystoscope was used to intubate the urethral meatus.  There was noted to be a false passage with beefy red tissue in the proximal urethra.  We were able to bypass this anteriorly and enter the bladder.  The prostatic urethra was very shaggy and irregular.  I visualized both ureteral stents.  There was some debris in the bladder but no obvious clots.  A sensor wire was advanced through the scope into the bladder under direct vision.  The scope was removed, and a 16 [place] council Foley was advanced over the wire into the bladder with return of clear fluid.  10 cc were placed in the balloon.  The catheter drained pink urine.  Bladder scan showed no residual.  Would recommend considering Foley changes on a monthly basis in clinic over a wire. Will discuss with Dr. Nolberto Hanlon, MD 12/13/2018

## 2018-12-14 ENCOUNTER — Inpatient Hospital Stay (HOSPITAL_BASED_OUTPATIENT_CLINIC_OR_DEPARTMENT_OTHER): Payer: Medicare Other | Admitting: Oncology

## 2018-12-14 ENCOUNTER — Other Ambulatory Visit: Payer: Self-pay

## 2018-12-14 ENCOUNTER — Inpatient Hospital Stay: Payer: Medicare Other

## 2018-12-14 ENCOUNTER — Inpatient Hospital Stay: Payer: Medicare Other | Attending: Oncology

## 2018-12-14 VITALS — BP 120/74 | HR 105 | Temp 96.2°F | Resp 18 | Wt 176.0 lb

## 2018-12-14 DIAGNOSIS — Z5112 Encounter for antineoplastic immunotherapy: Secondary | ICD-10-CM

## 2018-12-14 DIAGNOSIS — Z8546 Personal history of malignant neoplasm of prostate: Secondary | ICD-10-CM | POA: Insufficient documentation

## 2018-12-14 DIAGNOSIS — R2241 Localized swelling, mass and lump, right lower limb: Secondary | ICD-10-CM | POA: Diagnosis not present

## 2018-12-14 DIAGNOSIS — C678 Malignant neoplasm of overlapping sites of bladder: Secondary | ICD-10-CM

## 2018-12-14 DIAGNOSIS — C689 Malignant neoplasm of urinary organ, unspecified: Secondary | ICD-10-CM

## 2018-12-14 DIAGNOSIS — D72829 Elevated white blood cell count, unspecified: Secondary | ICD-10-CM | POA: Insufficient documentation

## 2018-12-14 DIAGNOSIS — R339 Retention of urine, unspecified: Secondary | ICD-10-CM | POA: Diagnosis not present

## 2018-12-14 LAB — COMPREHENSIVE METABOLIC PANEL
ALT: 11 U/L (ref 0–44)
AST: 13 U/L — ABNORMAL LOW (ref 15–41)
Albumin: 3.4 g/dL — ABNORMAL LOW (ref 3.5–5.0)
Alkaline Phosphatase: 81 U/L (ref 38–126)
Anion gap: 6 (ref 5–15)
BILIRUBIN TOTAL: 0.4 mg/dL (ref 0.3–1.2)
BUN: 20 mg/dL (ref 8–23)
CO2: 26 mmol/L (ref 22–32)
Calcium: 9 mg/dL (ref 8.9–10.3)
Chloride: 106 mmol/L (ref 98–111)
Creatinine, Ser: 1.12 mg/dL (ref 0.61–1.24)
GFR calc Af Amer: >60 mL/min (ref >60)
GFR calc non Af Amer: >60 mL/min (ref >60)
Glucose, Bld: 128 mg/dL — ABNORMAL HIGH (ref 70–99)
Potassium: 3.5 mmol/L (ref 3.5–5.1)
Sodium: 138 mmol/L (ref 135–145)
Total Protein: 7.2 g/dL (ref 6.5–8.1)

## 2018-12-14 LAB — CBC WITH DIFFERENTIAL/PLATELET
Abs Immature Granulocytes: 0.07 10*3/uL (ref 0.00–0.07)
Basophils Absolute: 0.1 10*3/uL (ref 0.0–0.1)
Basophils Relative: 0 %
Eosinophils Absolute: 0.4 10*3/uL (ref 0.0–0.5)
Eosinophils Relative: 2 %
HEMATOCRIT: 40.5 % (ref 39.0–52.0)
Hemoglobin: 12.8 g/dL — ABNORMAL LOW (ref 13.0–17.0)
Immature Granulocytes: 0 %
Lymphocytes Relative: 16 %
Lymphs Abs: 2.8 10*3/uL (ref 0.7–4.0)
MCH: 29.8 pg (ref 26.0–34.0)
MCHC: 31.6 g/dL (ref 30.0–36.0)
MCV: 94.2 fL (ref 80.0–100.0)
Monocytes Absolute: 1.1 10*3/uL — ABNORMAL HIGH (ref 0.1–1.0)
Monocytes Relative: 6 %
Neutro Abs: 13.8 10*3/uL — ABNORMAL HIGH (ref 1.7–7.7)
Neutrophils Relative %: 76 %
Platelets: 223 10*3/uL (ref 150–400)
RBC: 4.3 MIL/uL (ref 4.22–5.81)
RDW: 17 % — AB (ref 11.5–15.5)
WBC: 18.2 10*3/uL — ABNORMAL HIGH (ref 4.0–10.5)
nRBC: 0 % (ref 0.0–0.2)

## 2018-12-14 MED ORDER — CEFTRIAXONE SODIUM 500 MG IJ SOLR
500.0000 mg | Freq: Once | INTRAMUSCULAR | Status: AC
  Administered 2018-12-14: 500 mg via INTRAMUSCULAR

## 2018-12-14 MED ORDER — SODIUM CHLORIDE 0.9 % IV SOLN
1200.0000 mg | Freq: Once | INTRAVENOUS | Status: AC
  Administered 2018-12-14: 1200 mg via INTRAVENOUS
  Filled 2018-12-14: qty 20

## 2018-12-14 MED ORDER — SODIUM CHLORIDE 0.9 % IV SOLN
Freq: Once | INTRAVENOUS | Status: AC
  Administered 2018-12-14: 14:00:00 via INTRAVENOUS
  Filled 2018-12-14: qty 250

## 2018-12-14 MED ORDER — SODIUM CHLORIDE 0.9% FLUSH
10.0000 mL | Freq: Once | INTRAVENOUS | Status: AC
  Administered 2018-12-14: 10 mL via INTRAVENOUS
  Filled 2018-12-14: qty 10

## 2018-12-14 MED ORDER — HEPARIN SOD (PORK) LOCK FLUSH 100 UNIT/ML IV SOLN
500.0000 [IU] | Freq: Once | INTRAVENOUS | Status: AC
  Administered 2018-12-14: 500 [IU] via INTRAVENOUS

## 2018-12-14 NOTE — Progress Notes (Signed)
Here for follow up. Per wife yesterday pt went to Dr Audree Bane office for catheter change. Per wife they had difficult time w catheter insertion. Went home/ catherter leaking -went back and pt had cystoscopy and difficult time w insertion. . Pt also given " IM antibiotic injection " per wife ( type unknown)  Per pt " I feel pretty good "   Catheter bag noted  To have blood tinged urine-getting lighter per pt and wife. Has been blood tinged since yesterday w catheter change.

## 2018-12-14 NOTE — Progress Notes (Signed)
HR 105, ok to proceed per MD 

## 2018-12-14 NOTE — Addendum Note (Signed)
Addended by: Donalee Citrin on: 12/14/2018 08:52 AM   Modules accepted: Orders

## 2018-12-17 ENCOUNTER — Encounter: Payer: Self-pay | Admitting: Oncology

## 2018-12-17 NOTE — Progress Notes (Signed)
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 For his prostate cancer he has received IM RT in the past. He has been seeing Dr. Erlene Quan in the past for his recurrent UTIs as well as hematuria. CT scan in July 2018 showed bladder wall thickening with perivascular edema and inflammation in the right ureter greater than the left. Findings were thought to be due to pyelonephritis at that time. He underwent cystoscopy on 12/14/2017 which showed abnormal looking prostate with necrotic material lining the entire surface area. Diffuse copious debris within the bladder appearing to be erythematous without discrete bladder tumor but visualization was poor. He was then admitted to the hospital on 12/26/2017 with symptoms of UTI and sepsis as well as new moderate bilateral hydronephrosis.  CT abdomen and pelvis with contrast on 12/20/2017 again showed market bile bladder wall thickening with perivesicular edema. Within the lumen of the bladder there is a 93.9 cm soft tissue attenuating filling defect. This is indeterminate and could represent an area of blood clot versus urothelial lesion.  He underwent repeat cystoscopy with bilateral pyelogram and ureteral stent placement as well as TURBT and TURP. He was found to have a massive tumor involving the majority of the bladder with grossly necrotic and calcified material. There appeared to  be multifocal disease with large burden of the left anterior bladder wall extending posteriorly as well as adjacent to the bladder neck and beyond the right hemitrigone. Very little normal recognizable bladder mucosa remaining.   Biopsy from TURBT and TURP showed: High-grade urothelial carcinoma with extensive necrosisinvolving both the bladder and the prostate.CT chest did not reveal any evidence of metastatic disease. He was also not found to have any regional adenopathy on CT abdomen  Patient was seen by Dr. Erlene Quan and was not deemed to be a surgical candidate. He has been referred to [place] for definitive treatment options.  Patient lives with his wife at home and ambulates with a cane. He does need assistance with his ADLs to some extent. He has not had any falls and he denies any changes in his appetite or unintentional weight loss. Denies any pain. Reports some fatigue and occasional problems with constipation. He has had 3 hospitalizations last year for urinary tract infections and currently has a chronic Foley for the last 1 month  Dr. Erlene Quan performed interval TURBT on 01/15/18 and was able to debulk tumor as much as possible to reduce tumor burden  Patient received 5 cycles ofcarboplatin/ gemcitabine 2 weeks on and 1 week off ending 04/26/18.Patient did receive radiation for 10 days during cycle 2 of treatment. Given patients age, co-morbidities and frailty- he was not a cisplatin candidate.6th cycle not given due to fall and hip fracture  Patient had a repeat cystoscopy in September 2019 which showed no obvious tumor bladder but it did have a shaggy necrotic appearance at the bladder neck. Prostatic fossa was grossly abnormal necrotic and irregular without papillary change. Bladder neck was biopsied and showed residual high-grade urothelial carcinoma. Muscularis  propria was not seen in that specimen.Due to evidence of recurrent/residual disease patient was started on  Tecentriq on 07/26/2018   Interval history-patient had his urinary catheter changed yesterday and was given 1 dose of antibiotic.  Reports no fever or abdominal pain.  He ambulates around the house with the help of walker and has not had any recent falls. ECOG PS- 2 Pain scale- 0 Opioid associated constipation- no  Review of systems- Review of Systems  Constitutional: Positive for malaise/fatigue. Negative for chills, fever and weight loss.  HENT: Negative for congestion, ear discharge and nosebleeds.   Eyes: Negative for blurred vision.  Respiratory: Negative for cough, hemoptysis, sputum production, shortness of breath and wheezing.   Cardiovascular: Negative for chest pain, palpitations, orthopnea and claudication.  Gastrointestinal: Negative for abdominal pain, blood in stool, constipation, diarrhea, heartburn, melena, nausea and vomiting.  Genitourinary: Negative for dysuria, flank pain, frequency, hematuria and urgency.  Musculoskeletal: Negative for back pain, joint pain and myalgias.  Skin: Negative for rash.  Neurological: Negative for dizziness, tingling, focal weakness, seizures, weakness and headaches.  Endo/Heme/Allergies: Does not bruise/bleed easily.  Psychiatric/Behavioral: Negative for depression and suicidal ideas. The patient does not have insomnia.        No Known Allergies   Past Medical History:  Diagnosis Date  . Anxiety   . Arthritis   . Bladder cancer ([place])   . Dysrhythmia 07/2018   history of atrial flutter that worsens with anxiety  . Femur fracture, right ([place]) 05/01/2018  . GERD (gastroesophageal reflux disease)   . History of recent blood transfusion 05/2018  . Hypertension   . Iron deficiency anemia 09/14/2018  . Prostate cancer ([place]) 07/2018   cancer growing in prostate but not prostate cancer, it is from the bladder  . Umbilical hernia 41/6606  . Urinary retention 2019   foley catheter place 11/2017  . UTI (urinary tract infection) 2019    frequent UTI's over last year  . Wound eschar of foot 07/2018   left heal getting wrapped and requiring antibiotic cream. cracks open with weight bearing.     Past Surgical History:  Procedure Laterality Date  . CARPAL TUNNEL RELEASE Right   . CHOLECYSTECTOMY  2004  . CYSTOGRAM  07/18/2018   Procedure: CYSTOGRAM;  Surgeon: [name], MD;  Location: [facility];  Service: Urology;;  . Consuela Mimes W/ RETROGRADES Bilateral 07/18/2018   Procedure: CYSTOSCOPY WITH RETROGRADE PYELOGRAM;  Surgeon: [name], MD;  Location: [facility];  Service: Urology;  Laterality: Bilateral;  . CYSTOSCOPY W/ URETERAL STENT PLACEMENT Bilateral 12/27/2017   Procedure: CYSTOSCOPY WITH RETROGRADE PYELOGRAM/URETERAL STENT PLACEMENT;  Surgeon: [name], MD;  Location: [facility];  Service: Urology;  Laterality: Bilateral;  . CYSTOSCOPY W/ URETERAL STENT PLACEMENT Bilateral 07/18/2018   Procedure: CYSTOSCOPY WITH STENT REPLACEMENT (exchange);  Surgeon: [name], MD;  Location: [facility];  Service: Urology;  Laterality: Bilateral;  . CYSTOSCOPY WITH STENT PLACEMENT Bilateral 11/12/2018   Procedure: [place] WITH STENT Exchange;  Surgeon: [name], MD;  Location: [facility];  Service: Urology;  Laterality: Bilateral;  . INTRAMEDULLARY (IM) NAIL INTERTROCHANTERIC Right 05/02/2018   Procedure: INTRAMEDULLARY (IM) NAIL INTERTROCHANTRIC;  Surgeon: [name], MD;  Location: [facility];  Service: Orthopedics;  Laterality: Right;  . LEG TENDON SURGERY Right 1958  . PORTA CATH INSERTION N/A 01/22/2018   Procedure: PORTA CATH INSERTION;  Surgeon: [name], MD;  Location: [place] [facility];  Service: Cardiovascular;  Laterality: N/A;  . TRANSURETHRAL RESECTION OF BLADDER  TUMOR N/A 12/27/2017   Procedure: TRANSURETHRAL RESECTION OF BLADDER TUMOR (TURBT);  Surgeon: [name], MD;  Location: [facility];  Service: Urology;  Laterality: N/A;  . TRANSURETHRAL RESECTION OF BLADDER TUMOR N/A 01/15/2018    Procedure: TRANSURETHRAL RESECTION OF BLADDER TUMOR (TURBT);  Surgeon: [name], MD;  Location: [facility];  Service: Urology;  Laterality: N/A;  Need 2 hrs for this case please  . TRANSURETHRAL RESECTION OF BLADDER TUMOR N/A 07/18/2018   Procedure: TRANSURETHRAL RESECTION OF BLADDER TUMOR (TURBT);  Surgeon: [name], MD;  Location: [facility];  Service: Urology;  Laterality: N/A;    Social History   Socioeconomic History  . Marital status: Married    Spouse name: ruth  . Number of children: Not on file  . Years of education: Not on file  . Highest education level: Not on file  Occupational History  . Occupation: retired    Comment: Therapist, [employer]  Social Needs  . Financial resource strain: Not on file  . Food insecurity:    Worry: Not on file    Inability: Not on file  . Transportation needs:    Medical: Not on file    Non-medical: Not on file  Tobacco Use  . Smoking status: Never Smoker  . Smokeless tobacco: Never Used  Substance and Sexual Activity  . Alcohol use: No    Alcohol/week: 0.0 standard drinks  . Drug use: No  . Sexual activity: Not Currently  Lifestyle  . Physical activity:    Days per week: Not on file    Minutes per session: Not on file  . Stress: Not on file  Relationships  . Social connections:    Talks on phone: Not on file    Gets together: Not on file    Attends religious service: Not on file    Active member of club or organization: Not on file    Attends meetings of clubs or organizations: Not on file    Relationship status: Not on file  . Intimate partner violence:    Fear of current or ex partner: Not on file    Emotionally abused: Not on file    Physically abused: Not on file    Forced sexual activity: Not on file  Other Topics Concern  . Not on file  Social History Narrative  . Not on file    Family History  Problem Relation Age of Onset  . Cancer Mother   . Chronic Renal Failure Mother   . Heart disease Father       Current Outpatient Medications:  .  acetaminophen (TYLENOL) 500 MG tablet, Take 1,000 mg by mouth every 6 (six) hours as needed for moderate pain or headache. , Disp: , Rfl:  .  cetirizine (ZYRTEC) 10 MG tablet, Take 10 mg by mouth daily as needed for allergies. , Disp: , Rfl:  .  CRANBERRY PO, Take 1 capsule by mouth 2 (two) times daily. , Disp: , Rfl:  .  ferrous sulfate 325 (65 FE) MG tablet, Take 1 tablet (325 mg total) by mouth 2 (two) times daily with a meal., Disp: , Rfl: 3 .  Multiple Vitamin (MULTIVITAMIN WITH MINERALS) TABS tablet, Take 1 tablet by mouth daily., Disp: , Rfl:  .  MYRBETRIQ 50 MG TB24 tablet, TAKE 1 TABLET DAILY, Disp: 30 tablet, Rfl: 3 .  nystatin (NYAMYC) powder, Apply topically 2 (two) times daily., Disp: 60 g, Rfl: 3 .  vitamin B-12 (CYANOCOBALAMIN) 500 MCG tablet, Take 500 mcg  by mouth daily., Disp: , Rfl:  .  zinc oxide 20 % ointment, Apply 1 application topically as needed for irritation., Disp: , Rfl:  .  polyethylene glycol powder (MIRALAX) powder, Take 17 g by mouth daily as needed. Can increase to 3 times a day as needed for constipation but hold medication if has diarrhea (Patient not taking: Reported on 12/14/2018), Disp: 255 g, Rfl: 0  Physical exam:  Vitals:   12/14/18 1307  BP: 120/74  Pulse: (!) 105  Resp: 18  Temp: (!) 96.2 F (35.7 C)  TempSrc: Tympanic  Weight: 176 lb (79.8 kg)   Physical Exam Constitutional:      General: He is not in acute distress.    Comments: He is elderly and frail.  Sitting in a wheelchair.  Appears in no acute distress  HENT:     Head: Normocephalic and atraumatic.  Eyes:     Pupils: Pupils are equal, round, and reactive to light.  Neck:     Musculoskeletal: Normal range of motion.  Cardiovascular:     Rate and Rhythm: Normal rate and regular rhythm.     Heart sounds: Normal heart sounds.  Pulmonary:     Effort: Pulmonary effort is normal.     Breath sounds: Normal breath sounds.  Abdominal:      General: Bowel sounds are normal.     Palpations: Abdomen is soft.     Comments: Foley catheter is draining blood-tinged urine  Skin:    General: Skin is warm and dry.  Neurological:     Mental Status: He is alert and oriented to person, place, and time.      CMP Latest Ref Rng & Units 12/14/2018  Glucose 70 - 99 mg/dL 128(H)  BUN 8 - 23 mg/dL 20  Creatinine 0.61 - 1.24 mg/dL 1.12  Sodium 135 - 145 mmol/L 138  Potassium 3.5 - 5.1 mmol/L 3.5  Chloride 98 - 111 mmol/L 106  CO2 22 - 32 mmol/L 26  Calcium 8.9 - 10.3 mg/dL 9.0  Total Protein 6.5 - 8.1 g/dL 7.2  Total Bilirubin 0.3 - 1.2 mg/dL 0.4  Alkaline Phos 38 - 126 U/L 81  AST 15 - 41 U/L 13(L)  ALT 0 - 44 U/L 11   CBC Latest Ref Rng & Units 12/14/2018  WBC 4.0 - 10.5 K/uL 18.2(H)  Hemoglobin 13.0 - 17.0 g/dL 12.8(L)  Hematocrit 39.0 - 52.0 % 40.5  Platelets 150 - 400 K/uL 223      Assessment and plan- Patient is a 84 y.o. 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  He is here for on treatment assessment prior to cycle 7 of Tecentriq  Patient's white count is elevated to 18.2 today.  He is however not hypotensive not tachycardic and clinically and there are no signs and symptoms of infection.  It may be secondary to catheter exchange yesterday.  I have asked the patient's wife to monitor him closely and if she is concerned about any brewing signs and symptoms of infection she will call us immediately.  We will hold off on antibiotics at this time.  Counts otherwise okay to proceed with Tecentriq today.  His kidney functions are now back to baseline.  I will see him back in 3 weeks time for cycle 8.   Visit Diagnosis 1. Malignant neoplasm of overlapping sites of bladder ([place])   2.  Encounter for antineoplastic immunotherapy      Dr. Randa Evens, MD, MPH [facility] [phone] 12/17/2018 10:41 AM

## 2019-01-02 ENCOUNTER — Other Ambulatory Visit: Payer: Self-pay | Admitting: Urology

## 2019-01-02 ENCOUNTER — Other Ambulatory Visit: Payer: Self-pay | Admitting: Oncology

## 2019-01-02 DIAGNOSIS — C679 Malignant neoplasm of bladder, unspecified: Secondary | ICD-10-CM

## 2019-01-03 ENCOUNTER — Inpatient Hospital Stay (HOSPITAL_BASED_OUTPATIENT_CLINIC_OR_DEPARTMENT_OTHER): Payer: Medicare Other | Admitting: Oncology

## 2019-01-03 ENCOUNTER — Telehealth: Payer: Self-pay | Admitting: *Deleted

## 2019-01-03 ENCOUNTER — Inpatient Hospital Stay: Payer: Medicare Other

## 2019-01-03 ENCOUNTER — Other Ambulatory Visit: Payer: Self-pay | Admitting: *Deleted

## 2019-01-03 VITALS — BP 152/77 | HR 103 | Temp 97.3°F | Wt 177.0 lb

## 2019-01-03 DIAGNOSIS — Z5112 Encounter for antineoplastic immunotherapy: Secondary | ICD-10-CM

## 2019-01-03 DIAGNOSIS — R2241 Localized swelling, mass and lump, right lower limb: Secondary | ICD-10-CM

## 2019-01-03 DIAGNOSIS — D72829 Elevated white blood cell count, unspecified: Secondary | ICD-10-CM

## 2019-01-03 DIAGNOSIS — C689 Malignant neoplasm of urinary organ, unspecified: Secondary | ICD-10-CM

## 2019-01-03 DIAGNOSIS — C678 Malignant neoplasm of overlapping sites of bladder: Secondary | ICD-10-CM

## 2019-01-03 LAB — CBC WITH DIFFERENTIAL/PLATELET
ABS IMMATURE GRANULOCYTES: 0.17 10*3/uL — AB (ref 0.00–0.07)
Basophils Absolute: 0.1 10*3/uL (ref 0.0–0.1)
Basophils Relative: 0 %
Eosinophils Absolute: 0.3 10*3/uL (ref 0.0–0.5)
Eosinophils Relative: 1 %
HCT: 39.8 % (ref 39.0–52.0)
Hemoglobin: 13.1 g/dL (ref 13.0–17.0)
Immature Granulocytes: 1 %
LYMPHS ABS: 2.4 10*3/uL (ref 0.7–4.0)
Lymphocytes Relative: 10 %
MCH: 31 pg (ref 26.0–34.0)
MCHC: 32.9 g/dL (ref 30.0–36.0)
MCV: 94.3 fL (ref 80.0–100.0)
Monocytes Absolute: 1.2 10*3/uL — ABNORMAL HIGH (ref 0.1–1.0)
Monocytes Relative: 5 %
Neutro Abs: 19.2 10*3/uL — ABNORMAL HIGH (ref 1.7–7.7)
Neutrophils Relative %: 83 %
Platelets: 240 10*3/uL (ref 150–400)
RBC: 4.22 MIL/uL (ref 4.22–5.81)
RDW: 15.7 % — ABNORMAL HIGH (ref 11.5–15.5)
WBC: 23.3 10*3/uL — ABNORMAL HIGH (ref 4.0–10.5)
nRBC: 0 % (ref 0.0–0.2)

## 2019-01-03 LAB — COMPREHENSIVE METABOLIC PANEL
ALBUMIN: 3.4 g/dL — AB (ref 3.5–5.0)
ALT: 11 U/L (ref 0–44)
AST: 14 U/L — ABNORMAL LOW (ref 15–41)
Alkaline Phosphatase: 82 U/L (ref 38–126)
Anion gap: 9 (ref 5–15)
BUN: 21 mg/dL (ref 8–23)
CO2: 23 mmol/L (ref 22–32)
CREATININE: 1.09 mg/dL (ref 0.61–1.24)
Calcium: 9 mg/dL (ref 8.9–10.3)
Chloride: 105 mmol/L (ref 98–111)
GFR calc Af Amer: >60 mL/min (ref >60)
GFR calc non Af Amer: >60 mL/min (ref >60)
Glucose, Bld: 116 mg/dL — ABNORMAL HIGH (ref 70–99)
Potassium: 3.6 mmol/L (ref 3.5–5.1)
Sodium: 137 mmol/L (ref 135–145)
Total Bilirubin: 0.4 mg/dL (ref 0.3–1.2)
Total Protein: 7 g/dL (ref 6.5–8.1)

## 2019-01-03 LAB — URINALYSIS, COMPLETE (UACMP) WITH MICROSCOPIC
RBC / HPF: >50 RBC/hpf — ABNORMAL HIGH (ref 0–5)
Specific Gravity, Urine: 1.022 (ref 1.005–1.030)
Squamous Epithelial / HPF: NONE SEEN (ref 0–5)
WBC, UA: >50 WBC/hpf — ABNORMAL HIGH (ref 0–5)

## 2019-01-03 LAB — TSH: TSH: 4.192 u[IU]/mL (ref 0.350–4.500)

## 2019-01-03 MED ORDER — SODIUM CHLORIDE 0.9 % IV SOLN
1200.0000 mg | Freq: Once | INTRAVENOUS | Status: AC
  Administered 2019-01-03: 1200 mg via INTRAVENOUS
  Filled 2019-01-03: qty 20

## 2019-01-03 MED ORDER — HEPARIN SOD (PORK) LOCK FLUSH 100 UNIT/ML IV SOLN
500.0000 [IU] | Freq: Once | INTRAVENOUS | Status: AC
  Administered 2019-01-03: 500 [IU] via INTRAVENOUS
  Filled 2019-01-03: qty 5

## 2019-01-03 MED ORDER — SULFAMETHOXAZOLE-TRIMETHOPRIM 800-160 MG PO TABS: 1.0000 | ORAL_TABLET | Freq: Two times a day (BID) | ORAL | 0 refills | Status: DC

## 2019-01-03 MED ORDER — SODIUM CHLORIDE 0.9 % IV SOLN
Freq: Once | INTRAVENOUS | Status: AC
  Administered 2019-01-03: 14:00:00 via INTRAVENOUS
  Filled 2019-01-03: qty 250

## 2019-01-03 MED ORDER — SODIUM CHLORIDE 0.9% FLUSH
10.0000 mL | Freq: Once | INTRAVENOUS | Status: AC
  Administered 2019-01-03: 10 mL via INTRAVENOUS
  Filled 2019-01-03: qty 10

## 2019-01-03 NOTE — Progress Notes (Signed)
HR 103, ok to proceed

## 2019-01-03 NOTE — Telephone Encounter (Signed)
Called patient's wife and told her that the UA did come back with bacteria as well as white blood cells, will wait for the culture to come back but in the meantime Dr. [name] wanted to start him on an antibiotic.  Called Bactrim DS.  Take it twice a day for 5 days and [facility].  Wife agreeable and will pick the med

## 2019-01-04 ENCOUNTER — Ambulatory Visit: Payer: Medicare Other

## 2019-01-04 ENCOUNTER — Telehealth: Payer: Self-pay | Admitting: Internal Medicine

## 2019-01-04 ENCOUNTER — Ambulatory Visit: Payer: Medicare Other | Admitting: Oncology

## 2019-01-04 ENCOUNTER — Other Ambulatory Visit: Payer: Medicare Other

## 2019-01-04 ENCOUNTER — Ambulatory Visit
Admission: RE | Admit: 2019-01-04 | Discharge: 2019-01-04 | Disposition: A | Discharge status: Home / Self Care | Payer: Medicare Other | Source: Ambulatory Visit | Attending: Oncology | Admitting: Oncology

## 2019-01-04 DIAGNOSIS — R2241 Localized swelling, mass and lump, right lower limb: Secondary | ICD-10-CM | POA: Diagnosis present

## 2019-01-04 DIAGNOSIS — C689 Malignant neoplasm of urinary organ, unspecified: Secondary | ICD-10-CM | POA: Diagnosis not present

## 2019-01-04 MED ORDER — APIXABAN 5 MG PO TABS: 5.0000 mg | ORAL_TABLET | Freq: Two times a day (BID) | ORAL | 0 refills | Status: DC

## 2019-01-04 NOTE — Telephone Encounter (Signed)
Got a verbal report from ultrasound that patient has nonocclusive DVT femoral mid-distal to popliteal/patient symptomatic with leg swelling.  Discussed with Dr.Rao.  Plan starting Eliquis 5 mg twice daily [age renal function/nonocclusive].  Discussed with the patient's wife fall and bleeding precautions.  In agreement.  Please follow. Thx

## 2019-01-05 LAB — URINE CULTURE: Culture: >=100000 — AB

## 2019-01-07 ENCOUNTER — Telehealth: Payer: Self-pay | Admitting: *Deleted

## 2019-01-07 ENCOUNTER — Other Ambulatory Visit: Payer: Self-pay | Admitting: *Deleted

## 2019-01-07 ENCOUNTER — Encounter: Payer: Self-pay | Admitting: Oncology

## 2019-01-07 MED ORDER — AMOXICILLIN-POT CLAVULANATE 875-125 MG PO TABS: 1.0000 | ORAL_TABLET | Freq: Two times a day (BID) | ORAL | 0 refills | Status: DC

## 2019-01-07 NOTE — Telephone Encounter (Signed)
Called and spoke to wife at home about a change in the antibiotic.  Current antibiotic that we put in him on is showing that the bacteria that grew out of his culture is not able to cover this bacteria.  He does start him on Augmentin 875 mg twice a day for 7 days.  Wife states that she is already gave him 1 of his Septra today so she will pick up the new prescription and started this evening when the prescription originally due for his next dose.  Understands that she will no longer getting the Septra DS and will only give him the Augmentin twice a day for 7 days.  Wife asked about him up moving around and I told her with the clot it is good for him to keep up and moving however if he starts hurting on that left leg he needs to rest it but in no means do I tell him not to be moving around he definitely needs to get up moving around but just no when he starts feeling better feels the pain on that leg that has the clot he should sit down and rest a bit.  wife Understands

## 2019-01-07 NOTE — Progress Notes (Signed)
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 For his prostate cancer he has received IM RT in the past. He has been seeing Dr. Erlene Quan in the past for his recurrent UTIs as well as hematuria. CT scan in July 2018 showed bladder wall thickening with perivascular edema and inflammation in the right ureter greater than the left. Findings were thought to be due to pyelonephritis at that time. He underwent cystoscopy on 12/14/2017 which showed abnormal looking prostate with necrotic material lining the entire surface area. Diffuse copious debris within the bladder appearing to be erythematous without discrete bladder tumor but visualization was poor. He was then admitted to the hospital on 12/26/2017 with symptoms of UTI and sepsis as well as new moderate bilateral hydronephrosis.  CT abdomen and pelvis with contrast on 12/20/2017 again showed market bile bladder wall thickening with perivesicular edema. Within the lumen of the bladder there is a 93.9 cm soft tissue attenuating filling defect. This is indeterminate and could represent an area of blood clot versus urothelial lesion.  He underwent repeat cystoscopy with bilateral pyelogram and ureteral stent placement as well as TURBT and TURP. He was found to have a massive tumor involving the majority of the bladder with grossly necrotic and calcified material. There appeared to  be multifocal disease with large burden of the left anterior bladder wall extending posteriorly as well as adjacent to the bladder neck and beyond the right hemitrigone. Very little normal recognizable bladder mucosa remaining.   Biopsy from TURBT and TURP showed: High-grade urothelial carcinoma with extensive necrosisinvolving both the bladder and the prostate.CT chest did not reveal any evidence of metastatic disease. He was also not found to have any regional adenopathy on CT abdomen  Patient was seen by Dr. Erlene Quan and was not deemed to be a surgical candidate. He has been referred to [place] for definitive treatment options.  Patient lives with his wife at home and ambulates with a cane. He does need assistance with his ADLs to some extent. He has not had any falls and he denies any changes in his appetite or unintentional weight loss. Denies any pain. Reports some fatigue and occasional problems with constipation. He has had 3 hospitalizations last year for urinary tract infections and currently has a chronic Foley for the last 1 month  Dr. Erlene Quan performed interval TURBT on 01/15/18 and was able to debulk tumor as much as possible to reduce tumor burden  Patient received 5 cycles ofcarboplatin/ gemcitabine 2 weeks on and 1 week off ending 04/26/18.Patient did receive radiation for 10 days during cycle 2 of treatment. Given patients age, co-morbidities and frailty- he was not a cisplatin candidate.6th cycle not given due to fall and hip fracture  Patient had a repeat cystoscopy in September 2019 which showed no obvious tumor bladder but it did have a shaggy necrotic appearance at the bladder neck. Prostatic fossa was grossly abnormal necrotic and irregular without papillary change. Bladder neck was biopsied and showed residual high-grade urothelial carcinoma. Muscularis  propria was not seen in that specimen.Due to evidence of recurrent/residual disease patient was started on  Tecentriq on 07/26/2018   Interval history-he feels well and denies any fever, cough or sputum production.  Reports occasional abdominal spasms.  He is due for a Foley catheter change soon.  ECOG PS- 2 Pain scale- 0   Review of systems- Review of Systems  Constitutional: Positive for malaise/fatigue. Negative for chills, fever and weight loss.  HENT: Negative for congestion, ear discharge and nosebleeds.   Eyes: Negative for blurred vision.  Respiratory: Negative for cough, hemoptysis, sputum production, shortness of breath and wheezing.   Cardiovascular: Negative for chest pain, palpitations, orthopnea and claudication.  Gastrointestinal: Negative for abdominal pain, blood in stool, constipation, diarrhea, heartburn, melena, nausea and vomiting.       Abdominal cramps  Genitourinary: Negative for dysuria, flank pain, frequency, hematuria and urgency.  Musculoskeletal: Negative for back pain, joint pain and myalgias.  Skin: Negative for rash.  Neurological: Negative for dizziness, tingling, focal weakness, seizures, weakness and headaches.  Endo/Heme/Allergies: Does not bruise/bleed easily.  Psychiatric/Behavioral: Negative for depression and suicidal ideas. The patient does not have insomnia.       No Known Allergies   Past Medical History:  Diagnosis Date  . Anxiety   . Arthritis   . Bladder cancer ([place])   . Dysrhythmia 07/2018   history of atrial flutter that worsens with anxiety  . Femur fracture, right ([place]) 05/01/2018  . GERD (gastroesophageal reflux disease)   . History of recent blood transfusion 05/2018  . Hypertension   . Iron deficiency anemia 09/14/2018  . Prostate cancer ([place]) 07/2018   cancer growing in prostate but not prostate cancer, it is from the bladder  . Umbilical hernia 82/9562  . Urinary retention 2019   foley catheter place 11/2017  . UTI (urinary tract infection) 2019   frequent UTI's over last year  . Wound eschar of foot 07/2018   left heal  getting wrapped and requiring antibiotic cream. cracks open with weight bearing.     Past Surgical History:  Procedure Laterality Date  . CARPAL TUNNEL RELEASE Right   . CHOLECYSTECTOMY  2004  . CYSTOGRAM  07/18/2018   Procedure: CYSTOGRAM;  Surgeon: [name], MD;  Location: [facility];  Service: Urology;;  . Consuela Mimes W/ RETROGRADES Bilateral 07/18/2018   Procedure: CYSTOSCOPY WITH RETROGRADE PYELOGRAM;  Surgeon: [name], MD;  Location: [facility];  Service: Urology;  Laterality: Bilateral;  . CYSTOSCOPY W/ URETERAL STENT PLACEMENT Bilateral 12/27/2017   Procedure: CYSTOSCOPY WITH RETROGRADE PYELOGRAM/URETERAL STENT PLACEMENT;  Surgeon: [name], MD;  Location: [facility];  Service: Urology;  Laterality: Bilateral;  . CYSTOSCOPY W/ URETERAL STENT PLACEMENT Bilateral 07/18/2018   Procedure: CYSTOSCOPY WITH STENT REPLACEMENT (exchange);  Surgeon: [name], MD;  Location: [facility];  Service: Urology;  Laterality: Bilateral;  . CYSTOSCOPY WITH STENT PLACEMENT Bilateral 11/12/2018   Procedure: [place] WITH STENT Exchange;  Surgeon: [name], MD;  Location: [facility];  Service: Urology;  Laterality: Bilateral;  . INTRAMEDULLARY (IM) NAIL INTERTROCHANTERIC Right 05/02/2018   Procedure: INTRAMEDULLARY (IM) NAIL INTERTROCHANTRIC;  Surgeon: [name], MD;  Location: [facility];  Service: Orthopedics;  Laterality: Right;  . LEG TENDON SURGERY Right 1958  . PORTA CATH INSERTION N/A 01/22/2018   Procedure: PORTA CATH INSERTION;  Surgeon: [name], MD;  Location: [facility];  Service: Cardiovascular;  Laterality: N/A;  . TRANSURETHRAL RESECTION OF BLADDER TUMOR N/A 12/27/2017   Procedure: TRANSURETHRAL RESECTION  OF BLADDER TUMOR (TURBT);  Surgeon: [name], MD;  Location: [facility];  Service: Urology;  Laterality: N/A;  . TRANSURETHRAL RESECTION OF BLADDER TUMOR N/A 01/15/2018   Procedure: TRANSURETHRAL RESECTION OF BLADDER TUMOR (TURBT);  Surgeon: [name], MD;  Location: [facility];  Service: Urology;  Laterality: N/A;  Need 2 hrs for this case please  . TRANSURETHRAL RESECTION OF BLADDER TUMOR N/A 07/18/2018   Procedure: TRANSURETHRAL RESECTION OF BLADDER TUMOR (TURBT);  Surgeon: [name], MD;  Location: [facility];  Service: Urology;  Laterality: N/A;    Social History   Socioeconomic History  . Marital status: Married    Spouse name: ruth  . Number of children: Not on file  . Years of education: Not on file  . Highest education level: Not on file  Occupational History  . Occupation: retired    Comment: Therapist, [employer]  Social Needs  . Financial resource strain: Not on file  . Food insecurity:    Worry: Not on file    Inability: Not on file  . Transportation needs:    Medical: Not on file    Non-medical: Not on file  Tobacco Use  . Smoking status: Never Smoker  . Smokeless tobacco: Never Used  Substance and Sexual Activity  . Alcohol use: No    Alcohol/week: 0.0 standard drinks  . Drug use: No  . Sexual activity: Not Currently  Lifestyle  . Physical activity:    Days per week: Not on file    Minutes per session: Not on file  . Stress: Not on file  Relationships  . Social connections:    Talks on phone: Not on file    Gets together: Not on file    Attends religious service: Not on file    Active member of club or organization: Not on file    Attends meetings of clubs or organizations: Not on file    Relationship status: Not on file  . Intimate partner violence:    Fear of current or ex partner: Not on file    Emotionally abused: Not on file    Physically abused: Not on file    Forced sexual activity: Not on file  Other Topics Concern  . Not on file  Social History Narrative  . Not on file    Family History  Problem Relation Age of Onset  . Cancer Mother   . Chronic Renal Failure Mother   . Heart disease Father      Current Outpatient Medications:  .  acetaminophen (TYLENOL) 500 MG tablet, Take  1,000 mg by mouth every 6 (six) hours as needed for moderate pain or headache. , Disp: , Rfl:  .  cetirizine (ZYRTEC) 10 MG tablet, Take 10 mg by mouth daily as needed for allergies. , Disp: , Rfl:  .  CRANBERRY PO, Take 1 capsule by mouth 2 (two) times daily. , Disp: , Rfl:  .  ferrous sulfate 325 (65 FE) MG tablet, Take 1 tablet (325 mg total) by mouth 2 (two) times daily with a meal., Disp: , Rfl: 3 .  lidocaine-prilocaine (EMLA) cream, Apply 1 application topically as needed., Disp: 30 g, Rfl: 1 .  Multiple Vitamin (MULTIVITAMIN WITH MINERALS) TABS tablet, Take 1 tablet by mouth daily., Disp: , Rfl:  .  MYRBETRIQ 50 MG TB24 tablet, TAKE ONE TABLET EVERY DAY, Disp: 30 tablet, Rfl: 3 .  nystatin (NYAMYC) powder, Apply topically 2 (two) times daily., Disp: 60 g, Rfl: 3 .  polyethylene glycol powder (MIRALAX) powder, Take 17 g by mouth daily as needed. Can increase to 3 times a day as needed for constipation but hold medication if has diarrhea, Disp: 255 g, Rfl: 0 .  vitamin B-12 (CYANOCOBALAMIN) 500 MCG tablet, Take 500 mcg by mouth daily., Disp: , Rfl:  .  zinc oxide 20 % ointment, Apply 1 application topically as needed for irritation., Disp: , Rfl:  .  apixaban (ELIQUIS) 5 MG TABS tablet, Take 1 tablet (5 mg total) by mouth 2 (two) times daily., Disp: 60 tablet, Rfl: 0 .  sulfamethoxazole-trimethoprim (BACTRIM DS,SEPTRA DS) 800-160 MG tablet, Take 1 tablet by mouth 2 (two) times daily., Disp: 10 tablet, Rfl: 0  Physical exam:  Vitals:   01/03/19 1321  BP: (!) 152/77  Pulse: (!) 103  Temp: (!) 97.3 F (36.3 C)  TempSrc: Oral  Weight: 177 lb (80.3 kg)   Physical Exam Constitutional:      General: He is not in acute distress.    Comments: Elderly frail gentleman sitting in a wheelchair  HENT:     Head: Normocephalic and atraumatic.  Eyes:     Pupils: Pupils are equal, round, and reactive to light.  Neck:     Musculoskeletal: Normal range of motion.  Cardiovascular:     Rate and  Rhythm: Normal rate and regular rhythm.     Heart sounds: Normal heart sounds.  Pulmonary:     Effort: Pulmonary effort is normal.     Breath sounds: Normal breath sounds.  Abdominal:     General: Bowel sounds are normal.     Palpations: Abdomen is soft.     Comments: Foley draining clear urine  Musculoskeletal:     Comments: Right lower extremity is more swollen than the left  Skin:    General: Skin is warm and dry.  Neurological:     Mental Status: He is alert and oriented to person, place, and time.      CMP Latest Ref Rng & Units 01/03/2019  Glucose 70 - 99 mg/dL 116(H)  BUN 8 - 23 mg/dL 21  Creatinine 0.61 - 1.24 mg/dL 1.09  Sodium 135 - 145 mmol/L 137  Potassium 3.5 - 5.1 mmol/L 3.6  Chloride 98 - 111 mmol/L 105  CO2 22 - 32 mmol/L 23  Calcium 8.9 - 10.3 mg/dL 9.0  Total Protein 6.5 - 8.1 g/dL 7.0  Total Bilirubin 0.3 - 1.2 mg/dL 0.4  Alkaline Phos 38 - 126 U/L 82  AST 15 - 41 U/L 14(L)  ALT 0 - 44 U/L 11   CBC Latest Ref Rng & Units 01/03/2019  WBC 4.0 - 10.5 K/uL 23.3(H)  Hemoglobin 13.0 - 17.0 g/dL 13.1  Hematocrit 39.0 - 52.0 % 39.8  Platelets 150 - 400 K/uL 240    No images are attached to the encounter.  US Venous Img Lower Unilateral Right  Result Date: 01/04/2019 CLINICAL DATA:  Right lower extremity edema and history of urothelial carcinoma. EXAM: RIGHT LOWER EXTREMITY VENOUS DOPPLER ULTRASOUND TECHNIQUE: Gray-scale sonography with graded compression, as well as color Doppler and duplex ultrasound were performed to evaluate the lower extremity deep venous systems from the level of the common femoral vein and including the common femoral, femoral, profunda femoral, popliteal and calf veins including the posterior tibial, peroneal and gastrocnemius veins when visible. The superficial great saphenous vein was also interrogated. Spectral Doppler was utilized to evaluate flow at rest and with distal augmentation maneuvers in the common femoral, femoral and  popliteal veins. COMPARISON:  None. FINDINGS: Contralateral Common Femoral Vein: Respiratory phasicity is normal and symmetric with the symptomatic side. No evidence of thrombus. Normal compressibility. Common Femoral Vein: No evidence of thrombus. Normal compressibility, respiratory phasicity and response to augmentation. Saphenofemoral Junction: No evidence of thrombus. Normal compressibility and flow on color Doppler imaging. Profunda Femoral Vein: No evidence of thrombus. Normal compressibility and flow on color Doppler imaging. Femoral Vein: The mid to distal femoral vein in the thigh demonstrates some nonocclusive thrombus and is not completely compressible. There is some flow through the segment of nonocclusive thrombus demonstrated by Doppler. Popliteal Vein: There is evidence of nonocclusive thrombus in the popliteal vein with some flow present. Calf Veins: No evidence of thrombus. Normal compressibility and flow on color Doppler imaging. Superficial Great Saphenous Vein: No evidence of thrombus. Normal compressibility. Venous Reflux:  None. Other Findings:  Insert superficial thrombophlebitis IMPRESSION: Positive for nonocclusive right lower extremity DVT in the femoral vein of the mid to distal thigh and popliteal vein. Electronically Signed   By: Aletta Edouard M.D.   On: 01/04/2019 16:50     Assessment and plan- Patient is a 84 y.o. 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  He is here for on treatment assessment prior to cycle 8 of maintenance Tecentriq  1.  Counts okay to proceed with cycle 8 of maintenance Tecentriq today.  I will see him back in 3 weeks for cycle 9.  2.  Leukocytosis: It is concerning as his white count is steadily going up from 18 last time to 23 today.  He is relatively asymptomatic although he reports some mild abdominal  spasms.  I will therefore check urinalysis and urine culture as well as 2 sets of blood cultures.  If his urinalysis is positive we will treat him empirically for his UTI.  3.  Right lower extremity is more swollen than the left: I will arrange for right lower extremity Doppler to rule out DVT   Visit Diagnosis 1. Urothelial cancer ([place])   2. Localized swelling of right lower leg   3. Encounter for antineoplastic immunotherapy      Dr. Randa Evens, MD, MPH [facility] [phone] 01/07/2019 12:12 PM

## 2019-01-07 NOTE — Telephone Encounter (Signed)
-----   Message from Sindy Guadeloupe, MD sent at 01/07/2019 11:54 AM EST ----- Ok then let us switch him to augmentin for 7 days

## 2019-01-08 LAB — CULTURE, BLOOD (ROUTINE X 2)
Culture: NO GROWTH
Special Requests: ADEQUATE

## 2019-01-10 ENCOUNTER — Ambulatory Visit (INDEPENDENT_AMBULATORY_CARE_PROVIDER_SITE_OTHER): Payer: Medicare Other | Admitting: Urology

## 2019-01-10 ENCOUNTER — Encounter: Payer: Self-pay | Admitting: Urology

## 2019-01-10 VITALS — BP 121/72 | HR 94 | Ht 71.0 in | Wt 180.0 lb

## 2019-01-10 DIAGNOSIS — N138 Other obstructive and reflux uropathy: Secondary | ICD-10-CM

## 2019-01-10 DIAGNOSIS — N401 Enlarged prostate with lower urinary tract symptoms: Secondary | ICD-10-CM | POA: Diagnosis not present

## 2019-01-10 NOTE — Progress Notes (Signed)
01/10/19  84 year old male with a personal history of T4 bladder cancer managed with bilateral indwelling ureteral stent and Foley catheter returns today for Foley catheter change.  Notably, last exchange required cystoscopic placement at which time a 22 [place] Foley council tip catheter was placed.  This was on 12/13/2018.  In the interim, he has been treated for presumed urinary tract infection in the setting of leukocytosis.  Additionally, he was found to have a DVT and is now on anticoagulation (Eliquis).  Presents today for Foley catheter exchange over wire.  Procedure:  Patient was prepped as best as possible with Betadine solution.  A sterile drape was placed.  His indwelling Foley catheter was also prepped with Betadine.  A sensor wire was placed into the bladder at which time the patient remarked that he felt the wire in his bladder.  Balloon was deflated and the catheter was removed.  A new 65 [place] council tip catheter was inserted over the wire presumably to the level of the bladder.  The balloon was filled which was somewhat difficult to fill with some discomfort.  As such, an ultrasound was brought into the room and I confirmed the placement of the wire within the location of the bladder.  The balloon was filled and the catheter was able to be irrigated albeit with some pericatheter leakage but the majority of the fluid was able to be aspirated back with a syringe confirming position within the bladder.  Also an ultrasound, the bladder was noted to fill with irrigation and decompressed with aspiration further confirming the position of the Foley.  The catheter was then secured in place.    Given the presence of fairly significant hematuria, we did have him wait in our office for approximately an hour.  His catheter was irrigated several times and ultimately was light pink.  His wife is taught how to irrigate the catheter as a precaution.  He will return next month for Foley catheter  exchange over wire.  [name], MD

## 2019-01-11 ENCOUNTER — Ambulatory Visit: Payer: Medicare Other

## 2019-01-24 ENCOUNTER — Other Ambulatory Visit: Payer: Medicare Other

## 2019-01-24 ENCOUNTER — Ambulatory Visit: Payer: Medicare Other | Admitting: Oncology

## 2019-01-24 ENCOUNTER — Ambulatory Visit: Payer: Medicare Other

## 2019-01-24 ENCOUNTER — Inpatient Hospital Stay: Payer: Medicare Other

## 2019-01-24 ENCOUNTER — Inpatient Hospital Stay: Payer: Medicare Other | Admitting: Oncology

## 2019-01-24 ENCOUNTER — Telehealth: Payer: Self-pay

## 2019-01-24 NOTE — Telephone Encounter (Signed)
Spoke with patient's wife she states that patient is experiencing bladder spasms and leakage with now some blood. Patient was started on Eliquis which is most likely the cause of the bleeding. She was instructed to irrigate 2 times daily to ensure catheter is draining and if unable to irrigate to contact the office. Urine is still draining into the bag now but leakage with standing due to spasm

## 2019-01-25 ENCOUNTER — Ambulatory Visit: Payer: Medicare Other

## 2019-01-30 ENCOUNTER — Other Ambulatory Visit: Payer: Self-pay | Admitting: Internal Medicine

## 2019-01-31 NOTE — Telephone Encounter (Signed)
FYI- my nurse filled the script for eliquis. GB

## 2019-02-01 ENCOUNTER — Other Ambulatory Visit: Payer: Self-pay

## 2019-02-02 ENCOUNTER — Other Ambulatory Visit: Payer: Self-pay | Admitting: *Deleted

## 2019-02-02 DIAGNOSIS — C689 Malignant neoplasm of urinary organ, unspecified: Secondary | ICD-10-CM

## 2019-02-04 ENCOUNTER — Inpatient Hospital Stay: Payer: Medicare Other

## 2019-02-04 ENCOUNTER — Encounter: Payer: Self-pay | Admitting: Oncology

## 2019-02-04 ENCOUNTER — Other Ambulatory Visit: Payer: Self-pay

## 2019-02-04 ENCOUNTER — Other Ambulatory Visit: Payer: Self-pay | Admitting: *Deleted

## 2019-02-04 ENCOUNTER — Inpatient Hospital Stay (HOSPITAL_BASED_OUTPATIENT_CLINIC_OR_DEPARTMENT_OTHER): Payer: Medicare Other | Admitting: Oncology

## 2019-02-04 ENCOUNTER — Inpatient Hospital Stay: Payer: Medicare Other | Attending: Oncology

## 2019-02-04 VITALS — BP 102/66 | HR 99 | Temp 98.2°F | Resp 16 | Ht 71.0 in | Wt 183.0 lb

## 2019-02-04 DIAGNOSIS — Z79899 Other long term (current) drug therapy: Secondary | ICD-10-CM | POA: Diagnosis not present

## 2019-02-04 DIAGNOSIS — K5900 Constipation, unspecified: Secondary | ICD-10-CM

## 2019-02-04 DIAGNOSIS — Z87440 Personal history of urinary (tract) infections: Secondary | ICD-10-CM | POA: Insufficient documentation

## 2019-02-04 DIAGNOSIS — D72829 Elevated white blood cell count, unspecified: Secondary | ICD-10-CM | POA: Diagnosis not present

## 2019-02-04 DIAGNOSIS — E876 Hypokalemia: Secondary | ICD-10-CM

## 2019-02-04 DIAGNOSIS — Z86718 Personal history of other venous thrombosis and embolism: Secondary | ICD-10-CM | POA: Diagnosis not present

## 2019-02-04 DIAGNOSIS — M1990 Unspecified osteoarthritis, unspecified site: Secondary | ICD-10-CM | POA: Diagnosis not present

## 2019-02-04 DIAGNOSIS — K219 Gastro-esophageal reflux disease without esophagitis: Secondary | ICD-10-CM | POA: Insufficient documentation

## 2019-02-04 DIAGNOSIS — N3000 Acute cystitis without hematuria: Secondary | ICD-10-CM | POA: Diagnosis not present

## 2019-02-04 DIAGNOSIS — Z7901 Long term (current) use of anticoagulants: Secondary | ICD-10-CM

## 2019-02-04 DIAGNOSIS — Z8546 Personal history of malignant neoplasm of prostate: Secondary | ICD-10-CM

## 2019-02-04 DIAGNOSIS — R5383 Other fatigue: Secondary | ICD-10-CM

## 2019-02-04 DIAGNOSIS — I10 Essential (primary) hypertension: Secondary | ICD-10-CM | POA: Insufficient documentation

## 2019-02-04 DIAGNOSIS — N3289 Other specified disorders of bladder: Secondary | ICD-10-CM | POA: Insufficient documentation

## 2019-02-04 DIAGNOSIS — C689 Malignant neoplasm of urinary organ, unspecified: Secondary | ICD-10-CM | POA: Diagnosis present

## 2019-02-04 DIAGNOSIS — Z792 Long term (current) use of antibiotics: Secondary | ICD-10-CM | POA: Insufficient documentation

## 2019-02-04 DIAGNOSIS — R5381 Other malaise: Secondary | ICD-10-CM | POA: Insufficient documentation

## 2019-02-04 DIAGNOSIS — F419 Anxiety disorder, unspecified: Secondary | ICD-10-CM | POA: Diagnosis not present

## 2019-02-04 DIAGNOSIS — Z95828 Presence of other vascular implants and grafts: Secondary | ICD-10-CM

## 2019-02-04 LAB — URINALYSIS, COMPLETE (UACMP) WITH MICROSCOPIC
Bilirubin Urine: NEGATIVE
Glucose, UA: NEGATIVE mg/dL
Ketones, ur: NEGATIVE mg/dL
Nitrite: NEGATIVE
PROTEIN: 100 mg/dL — AB
Specific Gravity, Urine: 1.011 (ref 1.005–1.030)
Squamous Epithelial / HPF: NONE SEEN (ref 0–5)
WBC, UA: >50 WBC/hpf (ref 0–5)
pH: 7 (ref 5.0–8.0)

## 2019-02-04 LAB — CBC WITH DIFFERENTIAL/PLATELET
Abs Immature Granulocytes: 0.38 10*3/uL — ABNORMAL HIGH (ref 0.00–0.07)
Basophils Absolute: 0.1 10*3/uL (ref 0.0–0.1)
Basophils Relative: 0 %
EOS PCT: 1 %
Eosinophils Absolute: 0.3 10*3/uL (ref 0.0–0.5)
HCT: 34.1 % — ABNORMAL LOW (ref 39.0–52.0)
Hemoglobin: 11.1 g/dL — ABNORMAL LOW (ref 13.0–17.0)
Immature Granulocytes: 1 %
Lymphocytes Relative: 7 %
Lymphs Abs: 2.1 10*3/uL (ref 0.7–4.0)
MCH: 30.7 pg (ref 26.0–34.0)
MCHC: 32.6 g/dL (ref 30.0–36.0)
MCV: 94.5 fL (ref 80.0–100.0)
Monocytes Absolute: 1.7 10*3/uL — ABNORMAL HIGH (ref 0.1–1.0)
Monocytes Relative: 5 %
NEUTROS ABS: 26.8 10*3/uL — AB (ref 1.7–7.7)
Neutrophils Relative %: 86 %
Platelets: 341 10*3/uL (ref 150–400)
RBC: 3.61 MIL/uL — ABNORMAL LOW (ref 4.22–5.81)
RDW: 13.4 % (ref 11.5–15.5)
WBC: 31.4 10*3/uL — ABNORMAL HIGH (ref 4.0–10.5)
nRBC: 0 % (ref 0.0–0.2)

## 2019-02-04 LAB — COMPREHENSIVE METABOLIC PANEL
ALT: 16 U/L (ref 0–44)
AST: 16 U/L (ref 15–41)
Albumin: 2.8 g/dL — ABNORMAL LOW (ref 3.5–5.0)
Alkaline Phosphatase: 77 U/L (ref 38–126)
Anion gap: 12 (ref 5–15)
BUN: 20 mg/dL (ref 8–23)
CHLORIDE: 100 mmol/L (ref 98–111)
CO2: 23 mmol/L (ref 22–32)
Calcium: 8.4 mg/dL — ABNORMAL LOW (ref 8.9–10.3)
Creatinine, Ser: 1.71 mg/dL — ABNORMAL HIGH (ref 0.61–1.24)
GFR calc Af Amer: 43 mL/min — ABNORMAL LOW (ref >60)
GFR calc non Af Amer: 37 mL/min — ABNORMAL LOW (ref >60)
Glucose, Bld: 134 mg/dL — ABNORMAL HIGH (ref 70–99)
Potassium: 2.7 mmol/L — CL (ref 3.5–5.1)
Sodium: 135 mmol/L (ref 135–145)
Total Bilirubin: 0.7 mg/dL (ref 0.3–1.2)
Total Protein: 7 g/dL (ref 6.5–8.1)

## 2019-02-04 MED ORDER — LEVOFLOXACIN 750 MG PO TABS: 750.0000 mg | ORAL_TABLET | ORAL | 0 refills | Status: DC

## 2019-02-04 MED ORDER — HEPARIN SOD (PORK) LOCK FLUSH 100 UNIT/ML IV SOLN
500.0000 [IU] | Freq: Once | INTRAVENOUS | Status: AC
  Administered 2019-02-04: 500 [IU] via INTRAVENOUS

## 2019-02-04 MED ORDER — SODIUM CHLORIDE 0.9% FLUSH
10.0000 mL | Freq: Once | INTRAVENOUS | Status: AC
  Administered 2019-02-04: 10 mL via INTRAVENOUS
  Filled 2019-02-04: qty 10

## 2019-02-04 MED ORDER — SODIUM CHLORIDE 0.9 % IV SOLN
Freq: Once | INTRAVENOUS | Status: AC
  Administered 2019-02-04: 12:00:00 via INTRAVENOUS
  Filled 2019-02-04: qty 1000

## 2019-02-04 MED ORDER — SODIUM CHLORIDE 0.9 % IV SOLN
Freq: Once | INTRAVENOUS | Status: AC
  Administered 2019-02-04: 13:00:00 via INTRAVENOUS
  Filled 2019-02-04: qty 1000

## 2019-02-04 MED ORDER — HEPARIN SOD (PORK) LOCK FLUSH 100 UNIT/ML IV SOLN
INTRAVENOUS | Status: AC
  Filled 2019-02-04: qty 5

## 2019-02-04 NOTE — Progress Notes (Signed)
Pt drinking fluids but not wating much. We did not weigh-wife states it is very difficult and his wt 2 weeks ago 183 lbs. He has allergies and he is having some clear sputum and sometime white frothy but no yellow or green and no fevers.

## 2019-02-04 NOTE — Addendum Note (Signed)
Addended by: Mallie Snooks I on: 02/04/2019 11:12 AM   Modules accepted: Orders

## 2019-02-05 ENCOUNTER — Telehealth: Payer: Self-pay | Admitting: *Deleted

## 2019-02-05 NOTE — Telephone Encounter (Signed)
Called wife with scan datet 4/3 and arrival 9:45 for scan at [place]. NPO x 4 hours and pick up prep kit before scan. The wife is planning on getting it tomorrow and she will have him at the scan on friday

## 2019-02-05 NOTE — Progress Notes (Signed)
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 For his prostate cancer he has received IM RT in the past. He has been seeing Dr. Erlene Quan in the past for his recurrent UTIs as well as hematuria. CT scan in July 2018 showed bladder wall thickening with perivascular edema and inflammation in the right ureter greater than the left. Findings were thought to be due to pyelonephritis at that time. He underwent cystoscopy on 12/14/2017 which showed abnormal looking prostate with necrotic material lining the entire surface area. Diffuse copious debris within the bladder appearing to be erythematous without discrete bladder tumor but visualization was poor. He was then admitted to the hospital on 12/26/2017 with symptoms of UTI and sepsis as well as new moderate bilateral hydronephrosis.  CT abdomen and pelvis with contrast on 12/20/2017 again showed market bile bladder wall thickening with perivesicular edema. Within the lumen of the bladder there is a 93.9 cm soft tissue attenuating filling defect. This is indeterminate and could represent an area of blood clot versus urothelial lesion.  He underwent repeat cystoscopy with bilateral pyelogram and ureteral stent placement as well as TURBT and TURP. He was found to have a massive tumor involving the majority of the bladder with grossly necrotic and calcified material. There appeared to  be multifocal disease with large burden of the left anterior bladder wall extending posteriorly as well as adjacent to the bladder neck and beyond the right hemitrigone. Very little normal recognizable bladder mucosa remaining.   Biopsy from TURBT and TURP showed: High-grade urothelial carcinoma with extensive necrosisinvolving both the bladder and the prostate.CT chest did not reveal any evidence of metastatic disease. He was also not found to have any regional adenopathy on CT abdomen  Patient was seen by Dr. Erlene Quan and was not deemed to be a surgical candidate. He has been referred to [place] for definitive treatment options.  Patient lives with his wife at home and ambulates with a cane. He does need assistance with his ADLs to some extent. He has not had any falls and he denies any changes in his appetite or unintentional weight loss. Denies any pain. Reports some fatigue and occasional problems with constipation. He has had 3 hospitalizations last year for urinary tract infections and currently has a chronic Foley for the last 1 month  Dr. Erlene Quan performed interval TURBT on 01/15/18 and was able to debulk tumor as much as possible to reduce tumor burden  Patient received 5 cycles ofcarboplatin/ gemcitabine 2 weeks on and 1 week off ending 04/26/18.Patient did receive radiation for 10 days during cycle 2 of treatment. Given patients age, co-morbidities and frailty- he was not a cisplatin candidate.6th cycle not given due to fall and hip fracture  Patient had a repeat cystoscopy in September 2019 which showed no obvious tumor bladder but it did have a shaggy necrotic appearance at the bladder neck. Prostatic fossa was grossly abnormal necrotic and irregular without papillary change. Bladder neck was biopsied and showed residual high-grade urothelial carcinoma. Muscularis  propria was not seen in that specimen.Due to evidence of recurrent/residual disease patient was started on  Tecentriq on 07/26/2018  Interval history-patient has been having some bladder spasms and overflow incontinence.  Denies any abdominal pain fever cough or shortness of breath.  ECOG PS- 2 Pain scale- 0   Review of systems- Review of Systems  Constitutional: Positive for malaise/fatigue. Negative for chills, fever and weight loss.  HENT: Negative for congestion, ear discharge and nosebleeds.   Eyes: Negative for blurred vision.  Respiratory: Negative for cough, hemoptysis, sputum production, shortness of breath and wheezing.   Cardiovascular: Negative for chest pain, palpitations, orthopnea and claudication.  Gastrointestinal: Negative for abdominal pain, blood in stool, constipation, diarrhea, heartburn, melena, nausea and vomiting.  Genitourinary: Negative for dysuria, flank pain, frequency, hematuria and urgency.       Incontinence  Musculoskeletal: Negative for back pain, joint pain and myalgias.  Skin: Negative for rash.  Neurological: Negative for dizziness, tingling, focal weakness, seizures, weakness and headaches.  Endo/Heme/Allergies: Does not bruise/bleed easily.  Psychiatric/Behavioral: Negative for depression and suicidal ideas. The patient does not have insomnia.       No Known Allergies   Past Medical History:  Diagnosis Date   Anxiety    Arthritis    Bladder cancer ([place])    Dysrhythmia 07/2018   history of atrial flutter that worsens with anxiety   Femur fracture, right ([place]) 05/01/2018   GERD (gastroesophageal reflux disease)    History of recent blood transfusion 05/2018   Hypertension    Iron deficiency anemia 09/14/2018   Prostate cancer ([place]) 07/2018   cancer growing in prostate but not prostate cancer, it is from the bladder   Umbilical hernia 35/0093   Urinary retention 2019   foley catheter place 11/2017   UTI (urinary tract infection) 2019   frequent UTI's over last year   Wound eschar of foot 07/2018   left heal getting wrapped and  requiring antibiotic cream. cracks open with weight bearing.     Past Surgical History:  Procedure Laterality Date   CARPAL TUNNEL RELEASE Right    CHOLECYSTECTOMY  2004   CYSTOGRAM  07/18/2018   Procedure: CYSTOGRAM;  Surgeon: [name], MD;  Location: [facility];  Service: Urology;;   CYSTOSCOPY W/ RETROGRADES Bilateral 07/18/2018   Procedure: CYSTOSCOPY WITH RETROGRADE PYELOGRAM;  Surgeon: [name], MD;  Location: [facility];  Service: Urology;  Laterality: Bilateral;   CYSTOSCOPY W/ URETERAL STENT PLACEMENT Bilateral 12/27/2017   Procedure: CYSTOSCOPY WITH RETROGRADE PYELOGRAM/URETERAL STENT PLACEMENT;  Surgeon: [name], MD;  Location: [facility];  Service: Urology;  Laterality: Bilateral;   CYSTOSCOPY W/ URETERAL STENT PLACEMENT Bilateral 07/18/2018   Procedure: CYSTOSCOPY WITH STENT REPLACEMENT (exchange);  Surgeon: [name], MD;  Location: [facility];  Service: Urology;  Laterality: Bilateral;   CYSTOSCOPY WITH STENT PLACEMENT Bilateral 11/12/2018   Procedure: [place] WITH STENT Exchange;  Surgeon: [name], MD;  Location: [facility];  Service: Urology;  Laterality: Bilateral;   INTRAMEDULLARY (IM) NAIL INTERTROCHANTERIC Right 05/02/2018   Procedure: INTRAMEDULLARY (IM) NAIL INTERTROCHANTRIC;  Surgeon: [name], MD;  Location: [facility];  Service: Orthopedics;  Laterality: Right;   LEG TENDON SURGERY Right 1958   PORTA CATH INSERTION N/A 01/22/2018   Procedure: PORTA CATH INSERTION;  Surgeon: [name], MD;  Location: [facility];  Service: Cardiovascular;  Laterality: N/A;   TRANSURETHRAL RESECTION OF BLADDER TUMOR N/A 12/27/2017   Procedure: TRANSURETHRAL RESECTION OF BLADDER TUMOR (TURBT);  Surgeon: Erlene Quan,  Caryl Pina, MD;  Location: [facility];  Service: Urology;  Laterality: N/A;   TRANSURETHRAL RESECTION OF BLADDER TUMOR N/A 01/15/2018   Procedure: TRANSURETHRAL RESECTION OF BLADDER TUMOR (TURBT);  Surgeon: [name], MD;   Location: [facility];  Service: Urology;  Laterality: N/A;  Need 2 hrs for this case please   TRANSURETHRAL RESECTION OF BLADDER TUMOR N/A 07/18/2018   Procedure: TRANSURETHRAL RESECTION OF BLADDER TUMOR (TURBT);  Surgeon: [name], MD;  Location: [facility];  Service: Urology;  Laterality: N/A;    Social History   Socioeconomic History   Marital status: Married    Spouse name: ruth   Number of children: Not on file   Years of education: Not on file   Highest education level: Not on file  Occupational History   Occupation: retired    Comment: Therapist, nutritional [place] resource strain: Not on file   Food insecurity:    Worry: Not on file    Inability: Not on file   Transportation needs:    Medical: Not on file    Non-medical: Not on file  Tobacco Use   Smoking status: Never Smoker   Smokeless tobacco: Never Used  Substance and Sexual Activity   Alcohol use: No    Alcohol/week: 0.0 standard drinks   Drug use: No   Sexual activity: Not Currently  Lifestyle   Physical activity:    Days per week: Not on file    Minutes per session: Not on file   Stress: Not on file  Relationships   Social connections:    Talks on phone: Not on file    Gets together: Not on file    Attends religious service: Not on file    Active member of club or organization: Not on file    Attends meetings of clubs or organizations: Not on file    Relationship status: Not on file   Intimate partner violence:    Fear of current or ex partner: Not on file    Emotionally abused: Not on file    Physically abused: Not on file    Forced sexual activity: Not on file  Other Topics Concern   Not on file  Social History Narrative   Not on file    Family History  Problem Relation Age of Onset   Cancer Mother    Chronic Renal Failure Mother    Heart disease Father      Current Outpatient Medications:    acetaminophen (TYLENOL) 500 MG tablet, Take 1,000 mg by  mouth every 6 (six) hours as needed for moderate pain or headache. , Disp: , Rfl:    cetirizine (ZYRTEC) 10 MG tablet, Take 10 mg by mouth daily as needed for allergies. , Disp: , Rfl:    CRANBERRY PO, Take 1 capsule by mouth 2 (two) times daily. , Disp: , Rfl:    ELIQUIS 5 MG TABS tablet, TAKE ONE TABLET BY MOUTH TWICE DAILY, Disp: 60 tablet, Rfl: 3   ferrous sulfate 325 (65 FE) MG tablet, Take 1 tablet (325 mg total) by mouth 2 (two) times daily with a meal. (Patient taking differently: Take 325 mg by mouth daily with breakfast. ), Disp: , Rfl: 3   lidocaine-prilocaine (EMLA) cream, Apply 1 application topically as needed., Disp: 30 g, Rfl: 1   Multiple Vitamin (MULTIVITAMIN WITH MINERALS) TABS tablet, Take 1 tablet by mouth daily., Disp: , Rfl:    MYRBETRIQ 50 MG TB24 tablet, TAKE ONE TABLET EVERY  DAY, Disp: 30 tablet, Rfl: 3   nystatin (NYAMYC) powder, Apply topically 2 (two) times daily., Disp: 60 g, Rfl: 3   vitamin B-12 (CYANOCOBALAMIN) 500 MCG tablet, Take 500 mcg by mouth daily., Disp: , Rfl:    zinc oxide 20 % ointment, Apply 1 application topically as needed for irritation., Disp: , Rfl:    levofloxacin (LEVAQUIN) 750 MG tablet, Take 1 tablet (750 mg total) by mouth every other day., Disp: 7 tablet, Rfl: 0   polyethylene glycol powder (MIRALAX) powder, Take 17 g by mouth daily as needed. Can increase to 3 times a day as needed for constipation but hold medication if has diarrhea, Disp: 255 g, Rfl: 0  Physical exam:  Vitals:   02/04/19 1018  BP: 102/66  Pulse: 99  Resp: 16  Temp: 98.2 F (36.8 C)  TempSrc: Tympanic  Weight: 183 lb (83 kg)  Height: 5\' 11"  (1.803 m)   Physical Exam Constitutional:      General: He is not in acute distress.    Comments: Frail elderly gentleman sitting in a wheelchair.  Appears in no acute distress  HENT:     Head: Normocephalic and atraumatic.  Eyes:     Pupils: Pupils are equal, round, and reactive to light.  Neck:      Musculoskeletal: Normal range of motion.  Cardiovascular:     Rate and Rhythm: Regular rhythm. Tachycardia present.     Heart sounds: Normal heart sounds.  Pulmonary:     Effort: Pulmonary effort is normal.     Breath sounds: Normal breath sounds.  Abdominal:     General: Bowel sounds are normal.     Palpations: Abdomen is soft.     Comments: Foley draining somewhat cloudy urine.  No blood seen  Musculoskeletal:     Comments: RLE appears more swollen than left  Skin:    General: Skin is warm and dry.  Neurological:     Mental Status: He is alert and oriented to person, place, and time.      CMP Latest Ref Rng & Units 02/04/2019  Glucose 70 - 99 mg/dL 134(H)  BUN 8 - 23 mg/dL 20  Creatinine 0.61 - 1.24 mg/dL 1.71(H)  Sodium 135 - 145 mmol/L 135  Potassium 3.5 - 5.1 mmol/L 2.7(LL)  Chloride 98 - 111 mmol/L 100  CO2 22 - 32 mmol/L 23  Calcium 8.9 - 10.3 mg/dL 8.4(L)  Total Protein 6.5 - 8.1 g/dL 7.0  Total Bilirubin 0.3 - 1.2 mg/dL 0.7  Alkaline Phos 38 - 126 U/L 77  AST 15 - 41 U/L 16  ALT 0 - 44 U/L 16   CBC Latest Ref Rng & Units 02/04/2019  WBC 4.0 - 10.5 K/uL 31.4(H)  Hemoglobin 13.0 - 17.0 g/dL 11.1(L)  Hematocrit 39.0 - 52.0 % 34.1(L)  Platelets 150 - 400 K/uL 341     Assessment and plan- Patient is a 84 y.o. male male with locally advanced muscle invasive urothelial carcinoma stage III aT4 N0 M0 s/p 5 cycles of gemcitabine and carboplatin with residual/recurrent tumor currently on palliative Tecentriq.    He is here for on treatment assessment prior to cycle 9 of maintenance Tecentriq  1.  Patient continues to have a significant increase in his white count which mainly shows neutrophilia but also some monocytosis and lymphocytosis.  Clinically patient is hemodynamically stable and afebrile.  He did have an infectious work-up in February 2020 including 2 sets of blood cultures which were negative.  His urinalysis  showed Enterococcus faecalis and was treated with 7 days  of Augmentin.  At that time he was also found to have a right lower extremity DVT for which he is currently on Eliquis.  However his persistent rise in his white count is concerning.  I did speak to Dr. Erlene Quan today to see if there would be any role for stent exchange at this time.  However in her opinion stent exchange would not benefit the patient as it would get contaminated right away and it would be an added visit for invasive procedure in the middle of COVID pandemic.  We have drawn 2 sets of blood cultures today.  I will hold off on giving him Tecentriq today.  We will prescribe Levaquin 750 mg every 48 hours given that he has acute kidney injury today.    2.  I will plan to get repeat CT chest abdomen and pelvis end of this week after his kidney functions improved.  If he needs to get a CT without contrast I will also add a renal ultrasound to rule out a kidney abscess at that point.  Given that he looks clinically stable I will hold off on sending him to the emergency room at this time.  3.  We will give him 2 L of IV fluids today along with 40 mEq of IV potassium for his hypokalemia and he will continue to take oral potassium 20 mEq daily.  4.  I will tentatively see him back in 2 weeks time with CBC with differential and CMP for possible Tecentriq.  Patient knows to call if he feels worse in the interim   Visit Diagnosis 1. Urothelial cancer (HCC)   2. Leukocytosis, unspecified type   3. Hypokalemia   4. Acute cystitis without hematuria      Dr. Randa Evens, MD, MPH [facility] [phone] 02/05/2019 9:07 AM

## 2019-02-06 LAB — URINE CULTURE: Culture: >=100000 — AB

## 2019-02-08 ENCOUNTER — Ambulatory Visit
Admission: RE | Admit: 2019-02-08 | Discharge: 2019-02-08 | Disposition: A | Discharge status: Home / Self Care | Payer: Medicare Other | Source: Ambulatory Visit | Attending: Oncology | Admitting: Oncology

## 2019-02-08 ENCOUNTER — Ambulatory Visit: Admission: RE | Admit: 2019-02-08 | Payer: Medicare Other | Source: Ambulatory Visit

## 2019-02-08 ENCOUNTER — Other Ambulatory Visit: Payer: Self-pay

## 2019-02-08 ENCOUNTER — Other Ambulatory Visit: Payer: Self-pay | Admitting: *Deleted

## 2019-02-08 DIAGNOSIS — J949 Pleural condition, unspecified: Secondary | ICD-10-CM | POA: Insufficient documentation

## 2019-02-08 DIAGNOSIS — D72829 Elevated white blood cell count, unspecified: Secondary | ICD-10-CM

## 2019-02-08 DIAGNOSIS — N62 Hypertrophy of breast: Secondary | ICD-10-CM | POA: Insufficient documentation

## 2019-02-08 DIAGNOSIS — C689 Malignant neoplasm of urinary organ, unspecified: Secondary | ICD-10-CM | POA: Diagnosis not present

## 2019-02-08 DIAGNOSIS — I700 Atherosclerosis of aorta: Secondary | ICD-10-CM | POA: Diagnosis not present

## 2019-02-08 DIAGNOSIS — I2510 Atherosclerotic heart disease of native coronary artery without angina pectoris: Secondary | ICD-10-CM | POA: Insufficient documentation

## 2019-02-08 DIAGNOSIS — K861 Other chronic pancreatitis: Secondary | ICD-10-CM | POA: Insufficient documentation

## 2019-02-08 MED ORDER — IOHEXOL 300 MG/ML  SOLN
60.0000 mL | Freq: Once | INTRAMUSCULAR | Status: AC | PRN
  Administered 2019-02-08: 09:00:00 60 mL via INTRAVENOUS

## 2019-02-09 LAB — CULTURE, BLOOD (ROUTINE X 2)
Culture: NO GROWTH
Culture: NO GROWTH
Special Requests: ADEQUATE

## 2019-02-12 ENCOUNTER — Ambulatory Visit: Payer: Medicare Other | Admitting: Urology

## 2019-02-13 ENCOUNTER — Ambulatory Visit: Payer: Medicare Other | Admitting: Urology

## 2019-02-14 ENCOUNTER — Telehealth: Payer: Self-pay | Admitting: Urology

## 2019-02-14 NOTE — Telephone Encounter (Signed)
Pt's wife called and wants to know if there is any ointment or lotion they could get either over the counter or RX to put on skin around catheter.  She says sometimes it leaks and the skin around it is irritated.

## 2019-02-18 ENCOUNTER — Other Ambulatory Visit: Payer: Medicare Other

## 2019-02-18 ENCOUNTER — Ambulatory Visit: Payer: Medicare Other

## 2019-02-18 ENCOUNTER — Ambulatory Visit: Payer: Medicare Other | Admitting: Oncology

## 2019-02-20 ENCOUNTER — Other Ambulatory Visit: Payer: Self-pay

## 2019-02-20 ENCOUNTER — Ambulatory Visit (INDEPENDENT_AMBULATORY_CARE_PROVIDER_SITE_OTHER): Payer: Medicare Other | Admitting: Urology

## 2019-02-20 ENCOUNTER — Encounter: Payer: Self-pay | Admitting: Urology

## 2019-02-20 VITALS — BP 115/74 | HR 105 | Ht 71.0 in

## 2019-02-20 DIAGNOSIS — N1339 Other hydronephrosis: Secondary | ICD-10-CM

## 2019-02-20 DIAGNOSIS — C678 Malignant neoplasm of overlapping sites of bladder: Secondary | ICD-10-CM | POA: Diagnosis not present

## 2019-02-20 DIAGNOSIS — N390 Urinary tract infection, site not specified: Secondary | ICD-10-CM

## 2019-02-20 NOTE — Progress Notes (Signed)
02/20/2019 2:04 PM   [name] 1935-09-26 [id number]  Referring provider: [name], MD [place] [facility] [place], [place] [address]  Chief Complaint  Patient presents with  . Urinary Retention    foley exchange over wire    HPI: 84 year old male with T4 bladder cancer on palliative immunotherapy who returns today for Foley exchange over a wire.  Please see note for previous details.  Over the past month, he has been noted to have significant leukocytosis and being treated for antibiotics.  He finished a two-week course on Monday.  He is otherwise asymptomatic other than for foul-smelling urine per his wife.  Since his last visit, he had interval imaging in the form of CT abdomen pelvis with contrast as well as a CT of the chest on 02/08/2019.  This demonstrated stents in good position with persistent hydronephrosis, chronic.  Foley catheter is in adequate position however the bladder is mildly distended despite this.  There is circumferential bladder wall thickening which is irregular likely consistent with locally progressive disease.  There is also air in the wall of the bladder as well as within the right renal pelvis.  No adenopathy was appreciated.   PMH: Past Medical History:  Diagnosis Date  . Anxiety   . Arthritis   . Bladder cancer ([place])   . Dysrhythmia 07/2018   history of atrial flutter that worsens with anxiety  . Femur fracture, right ([place]) 05/01/2018  . GERD (gastroesophageal reflux disease)   . History of recent blood transfusion 05/2018  . Hypertension   . Iron deficiency anemia 09/14/2018  . Prostate cancer ([place]) 07/2018   cancer growing in prostate but not prostate cancer, it is from the bladder  . Umbilical hernia 61/6073  . Urinary retention 2019   foley catheter place 11/2017  . UTI (urinary tract infection) 2019   frequent UTI's over last year  . Wound eschar of foot 07/2018   left heal getting wrapped and  requiring antibiotic cream. cracks open with weight bearing.    Surgical History: Past Surgical History:  Procedure Laterality Date  . CARPAL TUNNEL RELEASE Right   . CHOLECYSTECTOMY  2004  . CYSTOGRAM  07/18/2018   Procedure: CYSTOGRAM;  Surgeon: [name], MD;  Location: [facility];  Service: Urology;;  . Consuela Mimes W/ RETROGRADES Bilateral 07/18/2018   Procedure: CYSTOSCOPY WITH RETROGRADE PYELOGRAM;  Surgeon: [name], MD;  Location: [facility];  Service: Urology;  Laterality: Bilateral;  . CYSTOSCOPY W/ URETERAL STENT PLACEMENT Bilateral 12/27/2017   Procedure: CYSTOSCOPY WITH RETROGRADE PYELOGRAM/URETERAL STENT PLACEMENT;  Surgeon: [name], MD;  Location: [facility];  Service: Urology;  Laterality: Bilateral;  . CYSTOSCOPY W/ URETERAL STENT PLACEMENT Bilateral 07/18/2018   Procedure: CYSTOSCOPY WITH STENT REPLACEMENT (exchange);  Surgeon: [name], MD;  Location: [facility];  Service: Urology;  Laterality: Bilateral;  . CYSTOSCOPY WITH STENT PLACEMENT Bilateral 11/12/2018   Procedure: [place] WITH STENT Exchange;  Surgeon: [name], MD;  Location: [facility];  Service: Urology;  Laterality: Bilateral;  . INTRAMEDULLARY (IM) NAIL INTERTROCHANTERIC Right 05/02/2018   Procedure: INTRAMEDULLARY (IM) NAIL INTERTROCHANTRIC;  Surgeon: [name], MD;  Location: [facility];  Service: Orthopedics;  Laterality: Right;  . LEG TENDON SURGERY Right 1958  . PORTA CATH INSERTION N/A 01/22/2018   Procedure: PORTA CATH INSERTION;  Surgeon: [name], MD;  Location: [place] [facility];  Service: Cardiovascular;  Laterality: N/A;  . TRANSURETHRAL RESECTION OF BLADDER TUMOR N/A 12/27/2017   Procedure:  TRANSURETHRAL RESECTION OF BLADDER TUMOR (TURBT);  Surgeon: [name], MD;  Location: [facility];  Service: Urology;  Laterality: N/A;  . TRANSURETHRAL RESECTION OF BLADDER TUMOR N/A 01/15/2018   Procedure: TRANSURETHRAL RESECTION OF BLADDER TUMOR (TURBT);  Surgeon: [name], MD;  Location: [facility];  Service: Urology;  Laterality: N/A;  Need 2 hrs for this case please  . TRANSURETHRAL RESECTION OF BLADDER TUMOR N/A 07/18/2018   Procedure: TRANSURETHRAL RESECTION OF BLADDER TUMOR (TURBT);  Surgeon: [name], MD;  Location: [facility];  Service: Urology;  Laterality: N/A;    Home Medications:  Allergies as of 02/20/2019   No Known Allergies     Medication List       Accurate as of February 20, 2019  2:04 PM. Always use your most recent med list.        acetaminophen 500 MG tablet Commonly known as:  TYLENOL Take 1,000 mg by mouth every 6 (six) hours as needed for moderate pain or headache.   cetirizine 10 MG tablet Commonly known as:  ZYRTEC Take 10 mg by mouth daily as needed for allergies.   CRANBERRY PO Take 1 capsule by mouth 2 (two) times daily.   Eliquis 5 MG Tabs tablet Generic drug:  apixaban TAKE ONE TABLET BY MOUTH TWICE DAILY   ferrous sulfate 325 (65 FE) MG tablet Take 1 tablet (325 mg total) by mouth 2 (two) times daily with a meal.   lidocaine-prilocaine cream Commonly known as:  EMLA Apply 1 application topically as needed.   multivitamin with minerals Tabs tablet Take 1 tablet by mouth daily.   Myrbetriq 50 MG Tb24 tablet Generic drug:  mirabegron ER TAKE ONE TABLET EVERY DAY   nystatin powder Commonly known as:  Nyamyc Apply topically 2 (two) times daily.   polyethylene glycol powder 17 GM/SCOOP powder Commonly known as:  MiraLax Take 17 g by mouth daily as needed. Can increase to 3 times a day as needed for constipation but hold medication if has diarrhea   vitamin B-12 500 MCG tablet Commonly known as:  CYANOCOBALAMIN Take 500 mcg by mouth daily.   zinc oxide 20 % ointment Apply 1 application topically as needed for irritation.       Allergies: No Known Allergies  Family History: Family History  Problem Relation Age of Onset  . Cancer Mother   . Chronic Renal Failure Mother   . Heart disease  Father     Social History:  reports that he has never smoked. He has never used smokeless tobacco. He reports that he does not drink alcohol or use drugs.  ROS: UROLOGY Frequent Urination?: No Hard to postpone urination?: No Burning/pain with urination?: No Get up at night to urinate?: No Leakage of urine?: Yes Urine stream starts and stops?: No Trouble starting stream?: No Do you have to strain to urinate?: No Blood in urine?: No Urinary tract infection?: No Sexually transmitted disease?: No Injury to kidneys or bladder?: No Painful intercourse?: No Weak stream?: No Erection problems?: No Penile pain?: No  Gastrointestinal Nausea?: No Vomiting?: No Indigestion/heartburn?: No Diarrhea?: No Constipation?: No  Constitutional Fever: No Night sweats?: No Weight loss?: No Fatigue?: No  Skin Skin rash/lesions?: No Itching?: No  Eyes Blurred vision?: No Double vision?: No  Ears/Nose/Throat Sore throat?: No Sinus problems?: No  Hematologic/Lymphatic Swollen glands?: No Easy bruising?: No  Cardiovascular Leg swelling?: No Chest pain?: No  Respiratory Cough?: No Shortness of breath?: No  Endocrine Excessive thirst?: No  Musculoskeletal Back  pain?: No Joint pain?: No  Neurological Headaches?: No Dizziness?: No  Psychologic Depression?: No Anxiety?: No  Physical Exam: BP 115/74   Pulse (!) 105   Ht 5\' 11"  (1.803 m)   BMI 25.52 kg/m   Constitutional:  Alert and oriented, No acute distress.  Accompanied by wife today.  Frail-appearing.  Tremulous.  Malodorous. HEENT: [place] AT, moist mucus membranes.  Trachea midline, no masses. Cardiovascular: No clubbing, cyanosis, or edema. Respiratory: Normal respiratory effort, no increased work of breathing. GI: Abdomen is soft, nontender, nondistended, no abdominal masses GU: Normal phallus with bilateral descended testicles.  Foley draining slightly cloudy urine. Skin: No rashes, bruises or suspicious  lesions. Neurologic: Grossly intact, no focal deficits, moving all 4 extremities. Psychiatric: Normal mood and affect.   Laboratory Data: Lab Results  Component Value Date   WBC 31.4 (H) 02/04/2019   HGB 11.1 (L) 02/04/2019   HCT 34.1 (L) 02/04/2019   MCV 94.5 02/04/2019   PLT 341 02/04/2019    Lab Results  Component Value Date   CREATININE 1.71 (H) 02/04/2019    Pertinent Imaging: CT scan personally reviewed as stated above in HPI, agree with radiologic interpretation with concern for urinary obstruction, suboptimally draining Foley and worsening hydronephrosis.  There is also concern for local progression in the bladder.  Procedure: Patient was prepped as best as possible with chlorhexidine solution.  A sterile drape was placed.  His indwelling Foley catheter was also prepped with Betadine.  A sensor wire was placed into the bladder at which time the patient remarked that he felt the wire in his bladder.  Balloon was deflated and the catheter was removed.  A new 63 French council tip catheter was inserted over the wire presumably to the level of the bladder.  The balloon was filled. The balloon was filled with 10 cc sterile water.  The catheter was then secured in place.  Urinary return was appreciated.    Cipro 500 mg x 1  Given for prophlaxis  Assessment & Plan:    1. Malignant neoplasm of overlapping sites of bladder [facility]) Managed by Dr. [name] CT scan concerning for local progression but no evidence of metastatic disease at this time  2. Other hydronephrosis Worsening hydronephrosis and difficulty with adequate catheter drainage as well as complicated Foley exchanges Additionally, his creatinine is more elevated now up to 1.7 from his previous baseline of around 1.1 concerning for worsening obstruction At this point, I do think is reasonable to consider removal of his double-J stents and Foley catheter and pursue bilateral nephrostomy tubes for more optimal urinary  drainage/decompression Discussed risk versus benefits in detail as well as rationale for this decision making Patient is wife are agreeable this plan, will pursue nephrostomy tubes prior to next Foley catheter exchange and will likely perform cystoscopy, bilateral stent removal after nephrostomy tubes have been adequately positioned All questions answered  3. Recurrent UTI Fairly significant leukocytosis, presumably secondary to urinary source now status post 2 weeks of antibiotics We will recheck CBC today Secondary to chronic indwelling catheters Otherwise asymptomatic at this time Management as above - CBC with Differential/Platelet  Return in about 4 weeks (around 03/20/2019) for possible cysto/ stent removal.  [name], MD  [facility] [address] [place], [place] [address] [phone]  I spent 25 min with this patient of which greater than 50% was spent in counseling and coordination of care with the patient.

## 2019-02-21 ENCOUNTER — Inpatient Hospital Stay: Payer: Medicare Other

## 2019-02-21 ENCOUNTER — Other Ambulatory Visit: Payer: Self-pay

## 2019-02-21 ENCOUNTER — Telehealth: Payer: Self-pay | Admitting: Urology

## 2019-02-21 ENCOUNTER — Inpatient Hospital Stay
Admission: AD | Admit: 2019-02-21 | Discharge: 2019-02-26 | DRG: 872 | Disposition: A | Discharge status: Skilled Nursing Facility | Payer: Medicare Other | Source: Ambulatory Visit | Attending: Specialist | Admitting: Specialist

## 2019-02-21 DIAGNOSIS — D72829 Elevated white blood cell count, unspecified: Secondary | ICD-10-CM

## 2019-02-21 DIAGNOSIS — N189 Chronic kidney disease, unspecified: Secondary | ICD-10-CM | POA: Diagnosis present

## 2019-02-21 DIAGNOSIS — I129 Hypertensive chronic kidney disease with stage 1 through stage 4 chronic kidney disease, or unspecified chronic kidney disease: Secondary | ICD-10-CM | POA: Diagnosis present

## 2019-02-21 DIAGNOSIS — Z7901 Long term (current) use of anticoagulants: Secondary | ICD-10-CM

## 2019-02-21 DIAGNOSIS — Z936 Other artificial openings of urinary tract status: Secondary | ICD-10-CM | POA: Diagnosis not present

## 2019-02-21 DIAGNOSIS — C679 Malignant neoplasm of bladder, unspecified: Secondary | ICD-10-CM | POA: Diagnosis present

## 2019-02-21 DIAGNOSIS — D649 Anemia, unspecified: Secondary | ICD-10-CM | POA: Diagnosis present

## 2019-02-21 DIAGNOSIS — Z841 Family history of disorders of kidney and ureter: Secondary | ICD-10-CM | POA: Diagnosis not present

## 2019-02-21 DIAGNOSIS — A419 Sepsis, unspecified organism: Secondary | ICD-10-CM | POA: Diagnosis present

## 2019-02-21 DIAGNOSIS — Z809 Family history of malignant neoplasm, unspecified: Secondary | ICD-10-CM

## 2019-02-21 DIAGNOSIS — C61 Malignant neoplasm of prostate: Secondary | ICD-10-CM | POA: Diagnosis present

## 2019-02-21 DIAGNOSIS — K219 Gastro-esophageal reflux disease without esophagitis: Secondary | ICD-10-CM | POA: Diagnosis present

## 2019-02-21 DIAGNOSIS — Z960 Presence of urogenital implants: Secondary | ICD-10-CM | POA: Diagnosis not present

## 2019-02-21 DIAGNOSIS — Z86718 Personal history of other venous thrombosis and embolism: Secondary | ICD-10-CM | POA: Diagnosis not present

## 2019-02-21 DIAGNOSIS — N179 Acute kidney failure, unspecified: Secondary | ICD-10-CM | POA: Diagnosis present

## 2019-02-21 DIAGNOSIS — C675 Malignant neoplasm of bladder neck: Secondary | ICD-10-CM | POA: Diagnosis not present

## 2019-02-21 DIAGNOSIS — Z8249 Family history of ischemic heart disease and other diseases of the circulatory system: Secondary | ICD-10-CM | POA: Diagnosis not present

## 2019-02-21 DIAGNOSIS — Z8781 Personal history of (healed) traumatic fracture: Secondary | ICD-10-CM

## 2019-02-21 DIAGNOSIS — H9190 Unspecified hearing loss, unspecified ear: Secondary | ICD-10-CM | POA: Diagnosis not present

## 2019-02-21 DIAGNOSIS — K0889 Other specified disorders of teeth and supporting structures: Secondary | ICD-10-CM | POA: Diagnosis not present

## 2019-02-21 DIAGNOSIS — Z87440 Personal history of urinary (tract) infections: Secondary | ICD-10-CM | POA: Diagnosis not present

## 2019-02-21 DIAGNOSIS — N131 Hydronephrosis with ureteral stricture, not elsewhere classified: Secondary | ICD-10-CM | POA: Diagnosis not present

## 2019-02-21 DIAGNOSIS — Z923 Personal history of irradiation: Secondary | ICD-10-CM | POA: Diagnosis not present

## 2019-02-21 DIAGNOSIS — N136 Pyonephrosis: Secondary | ICD-10-CM | POA: Diagnosis present

## 2019-02-21 DIAGNOSIS — Z9221 Personal history of antineoplastic chemotherapy: Secondary | ICD-10-CM

## 2019-02-21 DIAGNOSIS — Z8551 Personal history of malignant neoplasm of bladder: Secondary | ICD-10-CM

## 2019-02-21 DIAGNOSIS — N1330 Unspecified hydronephrosis: Secondary | ICD-10-CM | POA: Diagnosis not present

## 2019-02-21 DIAGNOSIS — M1990 Unspecified osteoarthritis, unspecified site: Secondary | ICD-10-CM | POA: Diagnosis present

## 2019-02-21 LAB — COMPREHENSIVE METABOLIC PANEL
ALT: 8 U/L (ref 0–44)
AST: 13 U/L — ABNORMAL LOW (ref 15–41)
Albumin: 2.9 g/dL — ABNORMAL LOW (ref 3.5–5.0)
Alkaline Phosphatase: 91 U/L (ref 38–126)
Anion gap: 11 (ref 5–15)
BUN: 37 mg/dL — ABNORMAL HIGH (ref 8–23)
CO2: 19 mmol/L — ABNORMAL LOW (ref 22–32)
Calcium: 8.8 mg/dL — ABNORMAL LOW (ref 8.9–10.3)
Chloride: 105 mmol/L (ref 98–111)
Creatinine, Ser: 2.62 mg/dL — ABNORMAL HIGH (ref 0.61–1.24)
GFR calc Af Amer: 25 mL/min — ABNORMAL LOW (ref >60)
GFR calc non Af Amer: 22 mL/min — ABNORMAL LOW (ref >60)
Glucose, Bld: 116 mg/dL — ABNORMAL HIGH (ref 70–99)
Potassium: 4.8 mmol/L (ref 3.5–5.1)
Sodium: 135 mmol/L (ref 135–145)
Total Bilirubin: 0.5 mg/dL (ref 0.3–1.2)
Total Protein: 6.8 g/dL (ref 6.5–8.1)

## 2019-02-21 LAB — CBC WITH DIFFERENTIAL/PLATELET
Abs Immature Granulocytes: 0.7 10*3/uL — ABNORMAL HIGH (ref 0.00–0.07)
Basophils Absolute: 0.2 10*3/uL (ref 0.0–0.2)
Basophils Absolute: 0.1 10*3/uL (ref 0.0–0.1)
Basophils Relative: 0 %
Basos: 1 %
EOS (ABSOLUTE): 0.4 10*3/uL (ref 0.0–0.4)
Eos: 1 %
Eosinophils Absolute: 0.2 10*3/uL (ref 0.0–0.5)
Eosinophils Relative: 1 %
HCT: 32.7 % — ABNORMAL LOW (ref 39.0–52.0)
Hematocrit: 36.5 % — ABNORMAL LOW (ref 37.5–51.0)
Hemoglobin: 11.6 g/dL — ABNORMAL LOW (ref 13.0–17.7)
Hemoglobin: 10.3 g/dL — ABNORMAL LOW (ref 13.0–17.0)
Immature Grans (Abs): 0.9 10*3/uL — ABNORMAL HIGH (ref 0.0–0.1)
Immature Granulocytes: 2 %
Immature Granulocytes: 2 %
Lymphocytes Absolute: 2.8 10*3/uL (ref 0.7–3.1)
Lymphocytes Relative: 4 %
Lymphs Abs: 1.6 10*3/uL (ref 0.7–4.0)
Lymphs: 6 %
MCH: 30 pg (ref 26.6–33.0)
MCH: 30 pg (ref 26.0–34.0)
MCHC: 31.8 g/dL (ref 31.5–35.7)
MCHC: 31.5 g/dL (ref 30.0–36.0)
MCV: 94 fL (ref 79–97)
MCV: 95.3 fL (ref 80.0–100.0)
Monocytes Absolute: 1.9 10*3/uL — ABNORMAL HIGH (ref 0.1–0.9)
Monocytes Absolute: 1.8 10*3/uL — ABNORMAL HIGH (ref 0.1–1.0)
Monocytes Relative: 5 %
Monocytes: 4 %
Neutro Abs: 32.1 10*3/uL — ABNORMAL HIGH (ref 1.7–7.7)
Neutrophils Absolute: 39.6 10*3/uL — ABNORMAL HIGH (ref 1.4–7.0)
Neutrophils Relative %: 88 %
Neutrophils: 86 %
Platelets: 461 10*3/uL — ABNORMAL HIGH (ref 150–450)
Platelets: 335 10*3/uL (ref 150–400)
RBC: 3.87 x10E6/uL — ABNORMAL LOW (ref 4.14–5.80)
RBC: 3.43 MIL/uL — ABNORMAL LOW (ref 4.22–5.81)
RDW: 12.9 % (ref 11.6–15.4)
RDW: 14.5 % (ref 11.5–15.5)
Smear Review: NORMAL
WBC: 45.7 10*3/uL (ref 3.4–10.8)
WBC: 36.5 10*3/uL — ABNORMAL HIGH (ref 4.0–10.5)
nRBC: 0 % (ref 0.0–0.2)

## 2019-02-21 LAB — APTT: aPTT: 51 seconds — ABNORMAL HIGH (ref 24–36)

## 2019-02-21 LAB — PROTIME-INR
INR: 1.6 — ABNORMAL HIGH (ref 0.8–1.2)
Prothrombin Time: 19.2 seconds — ABNORMAL HIGH (ref 11.4–15.2)

## 2019-02-21 LAB — LACTIC ACID, PLASMA: Lactic Acid, Venous: 1.8 mmol/L (ref 0.5–1.9)

## 2019-02-21 LAB — HEPARIN LEVEL (UNFRACTIONATED): Heparin Unfractionated: >3.6 IU/mL — ABNORMAL HIGH (ref 0.30–0.70)

## 2019-02-21 LAB — PROCALCITONIN: Procalcitonin: 5.05 ng/mL

## 2019-02-21 MED ORDER — ONDANSETRON HCL 4 MG PO TABS: 4.0000 mg | ORAL_TABLET | Freq: Four times a day (QID) | ORAL | Status: DC | PRN

## 2019-02-21 MED ORDER — SODIUM CHLORIDE 0.9 % IV SOLN
INTRAVENOUS | Status: DC
  Administered 2019-02-21 – 2019-02-26 (×7): via INTRAVENOUS

## 2019-02-21 MED ORDER — VITAMIN B-12 1000 MCG PO TABS
500.0000 ug | ORAL_TABLET | Freq: Every day | ORAL | Status: DC
  Administered 2019-02-21 – 2019-02-26 (×5): 500 ug via ORAL
  Filled 2019-02-21 (×6): qty 1

## 2019-02-21 MED ORDER — LORATADINE 10 MG PO TABS
10.0000 mg | ORAL_TABLET | Freq: Every day | ORAL | Status: DC
  Administered 2019-02-22 – 2019-02-26 (×4): 10 mg via ORAL
  Filled 2019-02-21 (×5): qty 1

## 2019-02-21 MED ORDER — ACETAMINOPHEN 325 MG PO TABS
650.0000 mg | ORAL_TABLET | Freq: Four times a day (QID) | ORAL | Status: DC | PRN
  Administered 2019-02-21 – 2019-02-24 (×2): 650 mg via ORAL
  Filled 2019-02-21 (×2): qty 2

## 2019-02-21 MED ORDER — MIRABEGRON ER 50 MG PO TB24
50.0000 mg | ORAL_TABLET | Freq: Every day | ORAL | Status: DC
  Administered 2019-02-22 – 2019-02-26 (×4): 50 mg via ORAL
  Filled 2019-02-21 (×6): qty 1

## 2019-02-21 MED ORDER — OXYCODONE HCL 5 MG PO TABS
5.0000 mg | ORAL_TABLET | ORAL | Status: DC | PRN
  Administered 2019-02-23 – 2019-02-25 (×4): 5 mg via ORAL
  Filled 2019-02-21 (×5): qty 1

## 2019-02-21 MED ORDER — POLYETHYLENE GLYCOL 3350 17 G PO PACK
17.0000 g | PACK | Freq: Every day | ORAL | Status: DC | PRN
  Filled 2019-02-21: qty 1

## 2019-02-21 MED ORDER — ONDANSETRON HCL 4 MG/2ML IJ SOLN
4.0000 mg | Freq: Four times a day (QID) | INTRAMUSCULAR | Status: DC | PRN
  Administered 2019-02-22: 4 mg via INTRAVENOUS
  Filled 2019-02-21: qty 2

## 2019-02-21 MED ORDER — ACETAMINOPHEN 650 MG RE SUPP: 650.0000 mg | Freq: Four times a day (QID) | RECTAL | Status: DC | PRN

## 2019-02-21 MED ORDER — PIPERACILLIN-TAZOBACTAM 3.375 G IVPB
3.3750 g | Freq: Three times a day (TID) | INTRAVENOUS | Status: DC
  Administered 2019-02-21 – 2019-02-23 (×5): 3.375 g via INTRAVENOUS
  Filled 2019-02-21 (×5): qty 50

## 2019-02-21 MED ORDER — FERROUS SULFATE 325 (65 FE) MG PO TABS
325.0000 mg | ORAL_TABLET | Freq: Every day | ORAL | Status: DC
  Administered 2019-02-22 – 2019-02-26 (×4): 325 mg via ORAL
  Filled 2019-02-21 (×5): qty 1

## 2019-02-21 MED ORDER — SODIUM CHLORIDE 0.9 % IV SOLN
INTRAVENOUS | Status: DC | PRN
  Administered 2019-02-21: 17:00:00 250 mL via INTRAVENOUS

## 2019-02-21 MED ORDER — ADULT MULTIVITAMIN W/MINERALS CH
1.0000 | ORAL_TABLET | Freq: Every day | ORAL | Status: DC
  Administered 2019-02-21 – 2019-02-26 (×5): 1 via ORAL
  Filled 2019-02-21 (×6): qty 1

## 2019-02-21 MED ORDER — HEPARIN (PORCINE) 25000 UT/250ML-% IV SOLN
1200.0000 [IU]/h | INTRAVENOUS | Status: AC
  Administered 2019-02-22: 1100 [IU]/h via INTRAVENOUS
  Filled 2019-02-21: qty 250

## 2019-02-21 NOTE — Telephone Encounter (Signed)
-----   Message from Lillia Corporal sent at 02/21/2019 12:17 PM EDT ----- Madaline Brilliant I will get this scheduled. ----- Message ----- From: [name], MD Sent: 02/20/2019   4:41 PM EDT To: Lillia Corporal  Can you please schedule this patient for bilateral nephrostomy tube placement in 3 to 4 weeks from now followed by an in person visit for cystoscopy, bilateral stent removal with me??  Thanks

## 2019-02-21 NOTE — H&P (Addendum)
[facility] [place] NAME: Vincent Poole    MR#:  [id number]  DATE OF BIRTH:  [birth year]  DATE OF ADMISSION:  (Not on file)  PRIMARY CARE PHYSICIAN: [name], MD   REQUESTING/REFERRING PHYSICIAN: [name], MD  CHIEF COMPLAINT:   Leukocytosis  HISTORY OF PRESENT ILLNESS:  Vincent Poole  is a 84 y.o. male with a known history of bladder cancer with bilateral indwelling stents and chronic indwelling foley, hypertension, recent DVT, GERD who was directly admitted from the urology office with worsening leukocytosis. He was last seen by his oncologist (Dr. [name]) on 02/04/19 and was prescribed Levaquin for presumed UTI. His WBC count was 31 at that time. He was seen in the urology office on 02/21/19 and his WBC was noted to be 45. He was directly admitted for further management. On arrival to the medical floor, he states he is overall feeling fine. He denies any fevers, chills, cough, or shortness of breath. He endorses vague suprapubic abdominal pain.  PAST MEDICAL HISTORY:   Past Medical History:  Diagnosis Date  . Anxiety   . Arthritis   . Bladder cancer ([place])   . Dysrhythmia 07/2018   history of atrial flutter that worsens with anxiety  . Femur fracture, right ([place]) 05/01/2018  . GERD (gastroesophageal reflux disease)   . History of recent blood transfusion 05/2018  . Hypertension   . Iron deficiency anemia 09/14/2018  . Prostate cancer ([place]) 07/2018   cancer growing in prostate but not prostate cancer, it is from the bladder  . Umbilical hernia 91/5056  . Urinary retention 2019   foley catheter place 11/2017  . UTI (urinary tract infection) 2019   frequent UTI's over last year  . Wound eschar of foot 07/2018   left heal getting wrapped and requiring antibiotic cream. cracks open with weight bearing.    PAST SURGICAL HISTORY:   Past Surgical History:  Procedure Laterality Date  . CARPAL TUNNEL RELEASE Right   .  CHOLECYSTECTOMY  2004  . CYSTOGRAM  07/18/2018   Procedure: CYSTOGRAM;  Surgeon: [name], MD;  Location: [facility];  Service: Urology;;  . Consuela Mimes W/ RETROGRADES Bilateral 07/18/2018   Procedure: CYSTOSCOPY WITH RETROGRADE PYELOGRAM;  Surgeon: [name], MD;  Location: [facility];  Service: Urology;  Laterality: Bilateral;  . CYSTOSCOPY W/ URETERAL STENT PLACEMENT Bilateral 12/27/2017   Procedure: CYSTOSCOPY WITH RETROGRADE PYELOGRAM/URETERAL STENT PLACEMENT;  Surgeon: [name], MD;  Location: [facility];  Service: Urology;  Laterality: Bilateral;  . CYSTOSCOPY W/ URETERAL STENT PLACEMENT Bilateral 07/18/2018   Procedure: CYSTOSCOPY WITH STENT REPLACEMENT (exchange);  Surgeon: [name], MD;  Location: [facility];  Service: Urology;  Laterality: Bilateral;  . CYSTOSCOPY WITH STENT PLACEMENT Bilateral 11/12/2018   Procedure: [place] WITH STENT Exchange;  Surgeon: [name], MD;  Location: [facility];  Service: Urology;  Laterality: Bilateral;  . INTRAMEDULLARY (IM) NAIL INTERTROCHANTERIC Right 05/02/2018   Procedure: INTRAMEDULLARY (IM) NAIL INTERTROCHANTRIC;  Surgeon: [name], MD;  Location: [facility];  Service: Orthopedics;  Laterality: Right;  . LEG TENDON SURGERY Right 1958  . PORTA CATH INSERTION N/A 01/22/2018   Procedure: PORTA CATH INSERTION;  Surgeon: [name], MD;  Location: [place] [facility];  Service: Cardiovascular;  Laterality: N/A;  . TRANSURETHRAL RESECTION OF BLADDER TUMOR N/A 12/27/2017   Procedure: TRANSURETHRAL RESECTION OF BLADDER TUMOR (TURBT);  Surgeon: [name], MD;  Location: [facility];  Service: Urology;  Laterality: N/A;  .  TRANSURETHRAL RESECTION OF BLADDER TUMOR N/A 01/15/2018   Procedure: TRANSURETHRAL RESECTION OF BLADDER TUMOR (TURBT);  Surgeon: [name], MD;  Location: [facility];  Service: Urology;  Laterality: N/A;  Need 2 hrs for this case please  . TRANSURETHRAL RESECTION OF BLADDER TUMOR N/A 07/18/2018   Procedure:  TRANSURETHRAL RESECTION OF BLADDER TUMOR (TURBT);  Surgeon: [name], MD;  Location: [facility];  Service: Urology;  Laterality: N/A;    SOCIAL HISTORY:   Social History   Tobacco Use  . Smoking status: Never Smoker  . Smokeless tobacco: Never Used  Substance Use Topics  . Alcohol use: No    Alcohol/week: 0.0 standard drinks    FAMILY HISTORY:   Family History  Problem Relation Age of Onset  . Cancer Mother   . Chronic Renal Failure Mother   . Heart disease Father     DRUG ALLERGIES:  No Known Allergies  REVIEW OF SYSTEMS:   Review of Systems  Constitutional: Negative for chills and fever.  HENT: Negative for congestion and sore throat.   Eyes: Negative for blurred vision and double vision.  Respiratory: Negative for cough and shortness of breath.   Cardiovascular: Negative for chest pain and palpitations.  Gastrointestinal: Positive for abdominal pain. Negative for diarrhea, nausea and vomiting.  Genitourinary: Positive for hematuria. Negative for flank pain.  Musculoskeletal: Negative for back pain and neck pain.  Neurological: Negative for dizziness and headaches.  Psychiatric/Behavioral: Negative for depression. The patient is not nervous/anxious.     MEDICATIONS AT HOME:   Prior to Admission medications   Medication Sig Start Date End Date Taking? Authorizing Provider  acetaminophen (TYLENOL) 500 MG tablet Take 1,000 mg by mouth every 6 (six) hours as needed for moderate pain or headache.     [provider]  cetirizine (ZYRTEC) 10 MG tablet Take 10 mg by mouth daily as needed for allergies.     [provider]  CRANBERRY PO Take 1 capsule by mouth 2 (two) times daily.     [provider]  ELIQUIS 5 MG TABS tablet TAKE ONE TABLET BY MOUTH TWICE DAILY 01/30/19   Cammie Sickle, MD  ferrous sulfate 325 (65 FE) MG tablet Take 1 tablet (325 mg total) by mouth 2 (two) times daily with a meal. Patient taking differently: Take 325  mg by mouth daily with breakfast.  05/06/18   Gouru, Aruna, MD  lidocaine-prilocaine (EMLA) cream Apply 1 application topically as needed. 01/02/19   Sindy Guadeloupe, MD  Multiple Vitamin (MULTIVITAMIN WITH MINERALS) TABS tablet Take 1 tablet by mouth daily.    [provider]  MYRBETRIQ 50 MG TB24 tablet TAKE ONE TABLET EVERY DAY 01/02/19   Zara Council A, PA-C  nystatin [facility]) powder Apply topically 2 (two) times daily. 09/28/18   Zara Council A, PA-C  polyethylene glycol powder (MIRALAX) powder Take 17 g by mouth daily as needed. Can increase to 3 times a day as needed for constipation but hold medication if has diarrhea 01/30/18   Sindy Guadeloupe, MD  vitamin B-12 (CYANOCOBALAMIN) 500 MCG tablet Take 500 mcg by mouth daily.    [provider]  zinc oxide 20 % ointment Apply 1 application topically as needed for irritation.    [provider]      VITAL SIGNS:  There were no vitals taken for this visit.  PHYSICAL EXAMINATION:  Physical Exam  GENERAL:  84 y.o.-year-old patient lying in the bed with no acute distress.  EYES: Pupils  equal, round, reactive to light and accommodation. No scleral icterus. Extraocular muscles intact.  HEENT: Head atraumatic, normocephalic. Oropharynx and nasopharynx clear.  NECK:  Supple, no jugular venous distention. No thyroid enlargement, no tenderness.  LUNGS: Normal breath sounds bilaterally, no wheezing, rales,rhonchi or crepitation. No use of accessory muscles of respiration.  CARDIOVASCULAR: RRR, S1, S2 normal. No murmurs, rubs, or gallops.  ABDOMEN: Soft, nondistended. Bowel sounds present. No organomegaly or mass. +mild suprapubic discomfort to palpation, no rebound or guarding GENITOURINARY: +indwelling foley with pink-tinged urine in the foley bag EXTREMITIES: No pedal edema, cyanosis, or clubbing.  NEUROLOGIC: Cranial nerves II through XII are intact. Muscle strength 5/5 in all extremities. Sensation intact. Gait not  checked.  PSYCHIATRIC: The patient is alert and oriented x 3.  SKIN: No obvious rash, lesion, or ulcer.   LABORATORY PANEL:   CBC Recent Labs  Lab 02/20/19 1404  WBC 45.7*  HGB 11.6*  HCT 36.5*  PLT 461*   ------------------------------------------------------------------------------------------------------------------  Chemistries  No results for input(s): NA, K, CL, CO2, GLUCOSE, BUN, CREATININE, CALCIUM, MG, AST, ALT, ALKPHOS, BILITOT in the last 168 hours.  Invalid input(s): GFRCGP ------------------------------------------------------------------------------------------------------------------  Cardiac Enzymes No results for input(s): TROPONINI in the last 168 hours. ------------------------------------------------------------------------------------------------------------------  RADIOLOGY:  No results found.    IMPRESSION AND PLAN:   Leukocytosis- has been treated with augmentin x several weeks and then levaquin x 2 weeks for presumed UTI, but WBC continues to increase. No infectious symptoms. Low suspicion for COVID without fevers, shortness of breath, or cough. 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 -Urology (Dr. Erlene Quan) following- recommend repeat CT abdomen/pelvis, IR to place bilateral nephrostomy tubes, plan to remove bilateral stents and foley after placement of nephrostomy tubes -Will order repeat CT abdomen/pelvis with or without contrast pending kidney funciton -Discussed with IR- typically like to wait 2 days for eliquis to wash out, but may be able to place nephrostomy tubes 4/17. Will re-assess patient in the morning. Recommend making patient NPO at  midnight. -Continue myrbetriq  Acute renal failure- due to above -Check CMP -Avoid nephrotoxic agents -IVFs  Recent DVT of right leg- diagnosed 01/04/19 -Holding eliquis and will place patient on heparin gtt, which will need to be held 6 hours prior to placement of percutaneous nephrostomy tubes   All the records are reviewed and case discussed with ED provider. Management plans discussed with the patient, family and they are in agreement.  CODE STATUS: Full  TOTAL TIME TAKING CARE OF THIS PATIENT: 45 minutes.    Berna Spare Kiefer Opheim M.D on 02/21/2019 at 12:35 PM  Between 7am to 6pm - Pager - [phone]  After 6pm go to [email or web address] - Proofreader  [facility]ospital[facility]  Office  [phone]  CC: Primary care physician; [name], MD   Note: This dictation was prepared with Dragon dictation along with smaller phrase technology. Any transcriptional errors that result from this process are unintentional.

## 2019-02-21 NOTE — Consult Note (Signed)
Pharmacy Antibiotic Note  Vincent Poole is a 84 y.o. male admitted on 02/21/2019 with worsening leukocytosis and urine as a possible source.  Pharmacy has been consulted for Zosyn dosing, which infectious disease is recommending. SCr is elevated above his baseline level of approximately 1.1 mg/dL  Plan: Zosyn 3.375g IV q8h (4 hour infusion).  Height: 5\' 11"  (180.3 cm) Weight: 159 lb 8 oz (72.3 kg) IBW/kg (Calculated) : 75.3  Temp (24hrs), Avg:98.2 F (36.8 C), Min:98.2 F (36.8 C), Max:98.2 F (36.8 C)  Recent Labs  Lab 02/20/19 1404  WBC 45.7*    Estimated Creatinine Clearance: 34.6 mL/min (A) (by C-G formula based on SCr of 1.71 mg/dL (H)).    No Known Allergies  Antimicrobials this admission: Zosyn 4/16 >>   Microbiology results: 4/16 BCx: pending 4/16 UCx: pending   Thank you for allowing pharmacy to be a part of this patient's care.  Dallie Piles, PharmD 02/21/2019 3:07 PM

## 2019-02-21 NOTE — Consult Note (Signed)
NAME: Vincent Poole  DOB: 02/06/1935  MRN: [id number]  Date/Time: 02/21/2019 4:32 PM  REQUESTING PROVIDER:Dr.MAyo Subjective:  REASON FOR CONSULT: leucocytosis ? Vincent Poole is a 84 y.o. male with a history of Ca bladder is admitted for worsening leucocytosis in the setting of worsening b/l hydronephrosis, ureteral stents and foley catheter he as been recently treated for enterococcus fecalis in the urine by his oncologist first with Augmentin and then levaquin. As his WBC is increasing and the one from 4/15 was 45 he was admitted for further management including Iv antibiotic and placement of nephrostomy tubes.  Pt does not have any fever  He was initially diagnosed with bladder ca in Feb 2019 , he has locally advanced muscle invasive bladder ca and has had ureteral stents since Feb 2019. He also had TRUBT in march 2019, He got chemo with 5 cycles of gemcitabine and carboplatin and radiation. In June 2019 he had a fall and fractured his rt femur and underwent Open reduction and internal fixation of a right intertrochanteric femur fracture on 05/03/19  Sept 2019 a  biopsy of bladder neck showed recurrent high grade urothelial carcinoma He was started on palliative Tecentriq  in sept 2019 by oncologist - he has been getting frequent ureteral stent changes and also has a chronic foley. He has a a contracted bladder < 50 cc and  the foley is being changed over a wire. Since feb 2020 his wbc has been gradually going up and he received 2 prolonged courses of antibiotic for enterococcus fecalis in the urine cultured on 2/27 and 02/04/19. His last antibiotic was levaquin started on 02/04/19 As the repeat WBC on 4/15 increased to 45 patient is being admitted  Pt says he is doing fine No pain  Past Medical History:  Diagnosis Date  . Anxiety   . Arthritis   . Bladder cancer ([place])   . Dysrhythmia 07/2018   history of atrial flutter that worsens with anxiety  . Femur fracture, right ([place])  05/01/2018  . GERD (gastroesophageal reflux disease)   . History of recent blood transfusion 05/2018  . Hypertension   . Iron deficiency anemia 09/14/2018  . Prostate cancer ([place]) 07/2018   cancer growing in prostate but not prostate cancer, it is from the bladder  . Umbilical hernia [id number]/5885  . Urinary retention 2019   foley catheter place 11/2017  . UTI (urinary tract infection) 2019   frequent UTI's over last year  . Wound eschar of foot 07/2018   left heal getting wrapped and requiring antibiotic cream. cracks open with weight bearing.    Past Surgical History:  Procedure Laterality Date  . CARPAL TUNNEL RELEASE Right   . CHOLECYSTECTOMY  2004  . CYSTOGRAM  07/18/2018   Procedure: CYSTOGRAM;  Surgeon: [name], MD;  Location: [facility];  Service: Urology;;  . Consuela Mimes W/ RETROGRADES Bilateral 07/18/2018   Procedure: CYSTOSCOPY WITH RETROGRADE PYELOGRAM;  Surgeon: [name], MD;  Location: [facility];  Service: Urology;  Laterality: Bilateral;  . CYSTOSCOPY W/ URETERAL STENT PLACEMENT Bilateral 12/27/2017   Procedure: CYSTOSCOPY WITH RETROGRADE PYELOGRAM/URETERAL STENT PLACEMENT;  Surgeon: [name], MD;  Location: [facility];  Service: Urology;  Laterality: Bilateral;  . CYSTOSCOPY W/ URETERAL STENT PLACEMENT Bilateral 07/18/2018   Procedure: CYSTOSCOPY WITH STENT REPLACEMENT (exchange);  Surgeon: [name], MD;  Location: [facility];  Service: Urology;  Laterality: Bilateral;  . CYSTOSCOPY WITH STENT PLACEMENT Bilateral 11/12/2018   Procedure: [place] WITH STENT Exchange;  Surgeon: [name],  MD;  Location: [facility];  Service: Urology;  Laterality: Bilateral;  . INTRAMEDULLARY (IM) NAIL INTERTROCHANTERIC Right 05/02/2018   Procedure: INTRAMEDULLARY (IM) NAIL INTERTROCHANTRIC;  Surgeon: [name], MD;  Location: [facility];  Service: Orthopedics;  Laterality: Right;  . LEG TENDON SURGERY Right 1958  . PORTA CATH INSERTION N/A 01/22/2018   Procedure: PORTA  CATH INSERTION;  Surgeon: [name], MD;  Location: [facility];  Service: Cardiovascular;  Laterality: N/A;  . TRANSURETHRAL RESECTION OF BLADDER TUMOR N/A 12/27/2017   Procedure: TRANSURETHRAL RESECTION OF BLADDER TUMOR (TURBT);  Surgeon: [name], MD;  Location: [facility];  Service: Urology;  Laterality: N/A;  . TRANSURETHRAL RESECTION OF BLADDER TUMOR N/A 01/15/2018   Procedure: TRANSURETHRAL RESECTION OF BLADDER TUMOR (TURBT);  Surgeon: [name], MD;  Location: [facility];  Service: Urology;  Laterality: N/A;  Need 2 hrs for this case please  . TRANSURETHRAL RESECTION OF BLADDER TUMOR N/A 07/18/2018   Procedure: TRANSURETHRAL RESECTION OF BLADDER TUMOR (TURBT);  Surgeon: [name], MD;  Location: [facility];  Service: Urology;  Laterality: N/A;    Social History   Socioeconomic History  . Marital status: Married    Spouse name: ruth  . Number of children: Not on file  . Years of education: Not on file  . Highest education level: Not on file  Occupational History  . Occupation: retired    Comment: Therapist, [employer]  Social Needs  . Financial resource strain: Not on file  . Food insecurity:    Worry: Not on file    Inability: Not on file  . Transportation needs:    Medical: Not on file    Non-medical: Not on file  Tobacco Use  . Smoking status: Never Smoker  . Smokeless tobacco: Never Used  Substance and Sexual Activity  . Alcohol use: No    Alcohol/week: 0.0 standard drinks  . Drug use: No  . Sexual activity: Not Currently  Lifestyle  . Physical activity:    Days per week: Not on file    Minutes per session: Not on file  . Stress: Not on file  Relationships  . Social connections:    Talks on phone: Not on file    Gets together: Not on file    Attends religious service: Not on file    Active member of club or organization: Not on file    Attends meetings of clubs or organizations: Not on file    Relationship status: Not on file  . Intimate  partner violence:    Fear of current or ex partner: Not on file    Emotionally abused: Not on file    Physically abused: Not on file    Forced sexual activity: Not on file  Other Topics Concern  . Not on file  Social History Narrative  . Not on file    Family History  Problem Relation Age of Onset  . Cancer Mother   . Chronic Renal Failure Mother   . Heart disease Father    No Known Allergies  Current Facility-Administered Medications  Medication Dose Route Frequency Provider Last Rate Last Dose  . 0.9 %  sodium chloride infusion   Intravenous Continuous Mayo, Pete Pelt, MD      . acetaminophen (TYLENOL) tablet 650 mg  650 mg Oral Q6H PRN Mayo, Pete Pelt, MD       Or  . acetaminophen (TYLENOL) suppository 650 mg  650 mg Rectal Q6H PRN Mayo, Pete Pelt, MD      . [  START ON 02/22/2019] ferrous sulfate tablet 325 mg  325 mg Oral Q breakfast Mayo, Pete Pelt, MD      . Derrill Memo ON 02/22/2019] heparin ADULT infusion 100 units/mL (25000 units/249mL sodium chloride 0.45%)  1,100 Units/hr Intravenous Continuous Dallie Piles, RPH      . loratadine (CLARITIN) tablet 10 mg  10 mg Oral Daily Mayo, Pete Pelt, MD      . mirabegron [facility]) tablet 50 mg  50 mg Oral Daily Mayo, Pete Pelt, MD      . multivitamin with minerals tablet 1 tablet  1 tablet Oral Daily Mayo, Pete Pelt, MD      . ondansetron [facility]) tablet 4 mg  4 mg Oral Q6H PRN Mayo, Pete Pelt, MD       Or  . ondansetron [facility]) injection 4 mg  4 mg Intravenous Q6H PRN Mayo, Pete Pelt, MD      . piperacillin-tazobactam (ZOSYN) IVPB 3.375 g  3.375 g Intravenous Q8H Dallie Piles, RPH      . polyethylene glycol (MIRALAX / GLYCOLAX) packet 17 g  17 g Oral Daily PRN Mayo, Pete Pelt, MD      . vitamin B-12 (CYANOCOBALAMIN) tablet 500 mcg  500 mcg Oral Daily Mayo, Pete Pelt, MD         Abtx:  Anti-infectives (From admission, onward)   Start     Dose/Rate Route Frequency Ordered Stop   02/21/19 1600  piperacillin-tazobactam (ZOSYN)  IVPB 3.375 g     3.375 g 12.5 mL/hr over 240 Minutes Intravenous Every 8 hours 02/21/19 1511        REVIEW OF SYSTEMS:  Const: negative fever, negative chills, negative weight loss Eyes: negative diplopia or visual changes, negative eye pain ENT: negative coryza, negative sore throat Resp: some cough, no hemoptysis, dyspnea Cards: negative for chest pain, palpitations, some lower extremity edema GU: has foley GI: Negative for abdominal pain, diarrhea, bleeding, constipation Skin: negative for rash and pruritus Heme: negative for easy bruising and gum/nose bleeding MS: has weakness Neurolo:negative for headaches, dizziness, vertigo, memory problems  Psych: negative for feelings of anxiety, depression  Allergy/Immunology- negative for any medication or food allergies ?  Objective:  VITALS:  BP 113/79 (BP Location: Right Arm)   Pulse 97   Temp 98.2 F (36.8 C) (Oral)   Resp 18   Ht 5\' 11"  (1.803 m)   Wt 72.3 kg   SpO2 100%   BMI 22.25 kg/m  PHYSICAL EXAM:  General: Alert, cooperative, no distress, appears stated age.hard of hearing Does not look septic  Head: Normocephalic, without obvious abnormality, atraumatic. Eyes: Conjunctivae clear, anicteric sclerae. Pupils are equal ENT Nares normal. No drainage or sinus tenderness. Lips, mucosa, and tongue normal. No Thrush, poor dentition Neck: Supple, symmetrical, no adenopathy, thyroid: non tender no carotid bruit and no JVD. Back: did not examine Lungs: b/la ir entry Heart: Regular rate and rhythm, no murmur, rub or gallop. Abdomen: Soft, non-tender,not distended. Bowel sounds normal. No masses foley  Extremities: ankle edema  Skin: No rashes or lesions. Or bruising Lymph: Cervical, supraclavicular normal. Neurologic: Grossly non-focal Pertinent Labs Lab Results CBC       Component Value Date/Time   WBC 36.5 (H) 02/21/2019 1517   RBC 3.43 (L) 02/21/2019 1517   HGB 10.3 (L) 02/21/2019 1517   HGB 11.6 (L)  02/20/2019 1404   HCT 32.7 (L) 02/21/2019 1517   HCT 36.5 (L) 02/20/2019 1404   PLT 335 02/21/2019 1517   PLT  461 (H) 02/20/2019 1404   MCV 95.3 02/21/2019 1517   MCV 94 02/20/2019 1404   MCH 30.0 02/21/2019 1517   MCHC 31.5 02/21/2019 1517   RDW 14.5 02/21/2019 1517   RDW 12.9 02/20/2019 1404   LYMPHSABS 1.6 02/21/2019 1517   LYMPHSABS 2.8 02/20/2019 1404   MONOABS 1.8 (H) 02/21/2019 1517   EOSABS 0.2 02/21/2019 1517   EOSABS 0.4 02/20/2019 1404   BASOSABS 0.1 02/21/2019 1517   BASOSABS 0.2 02/20/2019 1404    CMP Latest Ref Rng & Units 02/21/2019 02/04/2019 01/03/2019  Glucose 70 - 99 mg/dL 116(H) 134(H) 116(H)  BUN 8 - 23 mg/dL 37(H) 20 21  Creatinine 0.61 - 1.24 mg/dL 2.62(H) 1.71(H) 1.09  Sodium 135 - 145 mmol/L 135 135 137  Potassium 3.5 - 5.1 mmol/L 4.8 2.7(LL) 3.6  Chloride 98 - 111 mmol/L 105 100 105  CO2 22 - 32 mmol/L 19(L) 23 23  Calcium 8.9 - 10.3 mg/dL 8.8(L) 8.4(L) 9.0  Total Protein 6.5 - 8.1 g/dL 6.8 7.0 7.0  Total Bilirubin 0.3 - 1.2 mg/dL 0.5 0.7 0.4  Alkaline Phos 38 - 126 U/L 91 77 82  AST 15 - 41 U/L 13(L) 16 14(L)  ALT 0 - 44 U/L 8 16 11       Microbiology: No results found for this or any previous visit (from the past 240 hour(s)).  IMAGING RESULTS:    I have personally reviewed the films ? Impression/Recommendation ? Severe leucoytosis in the setting of bladder cancer with b/l uretral obstruction, b/l stents and b/l hydroureteronephrosis with rt side being new and worsening. This could be due to infection  or the leucocytosis could be due to the tumor burden being a reactive process- but the toxic granulations favor an infection. He will need perc nephrostomy to relieve the obstruction and infected urine . He had enterococcus in the urine twice before but has had e.coli last year. Will do zosyn currently until culture finalize. Pt does not look septic and is very stable  AKI on CKD- could be duet to d/l obstruction  Anemia   ?Recent DVT rt  popliteal vein- on eliquis, hence cannot have nephrostomy for a few days ? ___________________________________________________ Discussed with patient, and oncologist

## 2019-02-21 NOTE — Telephone Encounter (Signed)
02/21/19  Mr. Nanney was seen and evaluated yesterday as well as underwent Foley catheter exchange in the office, please see note for details.  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 [name].  He was first treated with Augmentin for several weeks and then subsequently Levaquin x 2 weeks completed on Monday.  At the time, blood cultures are negative and urine culture grew enterococcus.    Most recent imaging in the form of CT chest abdomen pelvis with contrast on 02/08/2019 showed persistent chronic hydronephrosis despite ureteral stenting which is the same on the left and possibly worse on the right however the time his bladder is not fully decompressed thus likely secondary to reflux.  His catheter was exchanged just yesterday.  There is air within the lumen of bladder and possibly within the wall of the bladder either representing emphysematous cystitis, trauma from recent Foley catheter exchange over wire, or even possibly malignant fistula is differential.     CBC checked yesterday to assess for defervescence of his leukocytosis, however, his WBC was noted to be further elevated to 45.  In clinic yesterday, he was essentially at his baseline afebrile with no specific complaints.  His wife does note that he is been somewhat more sleepy but otherwise unchanged.  No fevers.  After further discussion with Dr. [name] today, we do feel that the patient needs to be admitted for IV antibiotics and further maximal urinary decompression.  Unfortunately, he is on Eliquis for recent DVT this will need to be admitted for the purpose of transition to heparin drip as well for this procedure.  Curbside consultation was also obtained by infectious disease who recommended IV Zosyn for presumed UTI.  Case was discussed with admitting hospitalist, Dr. Brett Albino who has agreed to direct admit the patient and help bypass the emergency room.   Urologic recommendations are the following:  1.  Admission for IV fluids, supportive care, fluting CBC BMP, urine culture and blood cultures  2.  Consider repeat CT scan, with or without contrast of the abdomen pelvis pending his creatinine on admission.  3.  Hold Eliquis and transition to heparin drip which will need to be held 6 hours prior to placement of percutaneous nephrostomy tubes.  4.  Consult interventional radiology for placement of bilateral percutaneous nephrostomy tubes in setting of persistent hydronephrosis despite maximal urinary drainage with stent/Foley.  We will plan to remove these subsequent to percutaneous nephrostomy tube placement.  5.  Recommend formal ID consult.  Curbside recs were to start Zoysn.  6.  Flush Foley as needed for hematuria/debris which is chronic issue.  These recommendations were communicated to the admitting hospitalist service.  I called the patient and his wife today by telephone to further discuss these recommendations and they are agreeable with this plan.  All questions were answered.  Urology will follow along peripherally, please feel free to reach out to either myself or the on-call urologist if you not able to reach me during this admission with concerns or questions.  [name], MD

## 2019-02-21 NOTE — Consult Note (Signed)
ANTICOAGULATION CONSULT NOTE - Initial Consult  Pharmacy Consult for heparin drip management Indication: VTE prophylaxis  No Known Allergies  Patient Measurements: Height: 5\' 11"  (180.3 cm) Weight: 159 lb 8 oz (72.3 kg) IBW/kg (Calculated) : 75.3  Vital Signs: Temp: 98.2 F (36.8 C) (04/16 1439) Temp Source: Oral (04/16 1439) BP: 113/79 (04/16 1439) Pulse Rate: 97 (04/16 1439)  Labs: Recent Labs    02/20/19 1404  HGB 11.6*  HCT 36.5*  PLT 461*    Estimated Creatinine Clearance: 34.6 mL/min (A) (by C-G formula based on SCr of 1.71 mg/dL (H)).   Medical History: Past Medical History:  Diagnosis Date  . Anxiety   . Arthritis   . Bladder cancer ([place])   . Dysrhythmia 07/2018   history of atrial flutter that worsens with anxiety  . Femur fracture, right ([place]) 05/01/2018  . GERD (gastroesophageal reflux disease)   . History of recent blood transfusion 05/2018  . Hypertension   . Iron deficiency anemia 09/14/2018  . Prostate cancer ([place]) 07/2018   cancer growing in prostate but not prostate cancer, it is from the bladder  . Umbilical hernia 69/6789  . Urinary retention 2019   foley catheter place 11/2017  . UTI (urinary tract infection) 2019   frequent UTI's over last year  . Wound eschar of foot 07/2018   left heal getting wrapped and requiring antibiotic cream. cracks open with weight bearing.    Assessment: 83 year-old male who has been taking apixaban for a DVT that was diagnosed 01/04/19. He has a h/o Hhigh-grade urothelial carcinoma with bilateral indwelling stents and chronic indwelling foley due to chronic bilateral hydronephrosis. IR to place bilateral nephrostomy tubes, plan to remove bilateral stents and foley after placement of nephrostomy tubes, possibly on 4/17 but she will be evaluated in the morning. Baseline Hgb 11.6, PLT 461, other baseline labs have been ordered but not yet resulted. He reports his last dose of apixaban today at approximately  noon.  Goal of Therapy:  Heparin level 0.3-0.7 units/ml aPTT 66-102s Monitor platelets by anticoagulation protocol: Yes   Plan:  -Start heparin infusion at 1100 units/hr beginning at midnight -Check aPTT level in 8 hours after infusion begins and HL daily while on heparin until labs correlate -Continue to monitor H&H and platelets  Dallie Piles, PharmD 02/21/2019,3:49 PM

## 2019-02-21 NOTE — Plan of Care (Addendum)
Direct admit.  A&ox4. HOH. VSS. Denies pain. Pt admitted with a foley. Urine amber, cloudy with sediments. Ate dinner well.  Scheduled for bilateral nephrotomy tubes placement tomorrow in am.

## 2019-02-21 NOTE — Progress Notes (Signed)
Family Meeting Note  Advance Directive:yes  Today a meeting took place with the Patient.  Patient is able to participate.  The following clinical team members were present during this meeting:MD  The following were discussed:Patient's diagnosis: bladder cancer, Patient's progosis: Unable to determine and Goals for treatment: Full Code   Patient states that he overall feels well and that he would like to keep living for his wife. He would like to be full code and receive aggressive medical treatment at this time.  Additional follow-up to be provided: prn  Time spent during discussion:20 minutes  Evette Doffing, MD

## 2019-02-22 DIAGNOSIS — H9190 Unspecified hearing loss, unspecified ear: Secondary | ICD-10-CM

## 2019-02-22 DIAGNOSIS — C679 Malignant neoplasm of bladder, unspecified: Secondary | ICD-10-CM

## 2019-02-22 LAB — CBC
HCT: 29.6 % — ABNORMAL LOW (ref 39.0–52.0)
Hemoglobin: 9.5 g/dL — ABNORMAL LOW (ref 13.0–17.0)
MCH: 30.9 pg (ref 26.0–34.0)
MCHC: 32.1 g/dL (ref 30.0–36.0)
MCV: 96.4 fL (ref 80.0–100.0)
Platelets: 319 10*3/uL (ref 150–400)
RBC: 3.07 MIL/uL — ABNORMAL LOW (ref 4.22–5.81)
RDW: 14.6 % (ref 11.5–15.5)
WBC: 34.7 10*3/uL — ABNORMAL HIGH (ref 4.0–10.5)
nRBC: 0 % (ref 0.0–0.2)

## 2019-02-22 LAB — BASIC METABOLIC PANEL
Anion gap: 9 (ref 5–15)
BUN: 38 mg/dL — ABNORMAL HIGH (ref 8–23)
CO2: 19 mmol/L — ABNORMAL LOW (ref 22–32)
Calcium: 8.6 mg/dL — ABNORMAL LOW (ref 8.9–10.3)
Chloride: 110 mmol/L (ref 98–111)
Creatinine, Ser: 2.86 mg/dL — ABNORMAL HIGH (ref 0.61–1.24)
GFR calc Af Amer: 23 mL/min — ABNORMAL LOW (ref >60)
GFR calc non Af Amer: 20 mL/min — ABNORMAL LOW (ref >60)
Glucose, Bld: 101 mg/dL — ABNORMAL HIGH (ref 70–99)
Potassium: 4.6 mmol/L (ref 3.5–5.1)
Sodium: 138 mmol/L (ref 135–145)

## 2019-02-22 LAB — URINALYSIS, COMPLETE (UACMP) WITH MICROSCOPIC
Bilirubin Urine: NEGATIVE
Glucose, UA: NEGATIVE mg/dL
Ketones, ur: NEGATIVE mg/dL
Nitrite: NEGATIVE
Protein, ur: 100 mg/dL — AB
RBC / HPF: >50 RBC/hpf — ABNORMAL HIGH (ref 0–5)
Specific Gravity, Urine: 1.009 (ref 1.005–1.030)
Squamous Epithelial / HPF: NONE SEEN (ref 0–5)
WBC, UA: >50 WBC/hpf — ABNORMAL HIGH (ref 0–5)
pH: 7 (ref 5.0–8.0)

## 2019-02-22 LAB — APTT
aPTT: 45 seconds — ABNORMAL HIGH (ref 24–36)
aPTT: 52 seconds — ABNORMAL HIGH (ref 24–36)

## 2019-02-22 LAB — HEPARIN LEVEL (UNFRACTIONATED): Heparin Unfractionated: 1.81 IU/mL — ABNORMAL HIGH (ref 0.30–0.70)

## 2019-02-22 SURGERY — Surgical Case
Anesthesia: *Unknown

## 2019-02-22 MED ORDER — ENSURE ENLIVE PO LIQD
237.0000 mL | Freq: Three times a day (TID) | ORAL | Status: DC
  Administered 2019-02-25 – 2019-02-26 (×3): 237 mL via ORAL

## 2019-02-22 MED ORDER — MORPHINE SULFATE (PF) 2 MG/ML IV SOLN
1.0000 mg | INTRAVENOUS | Status: DC | PRN
  Administered 2019-02-22 – 2019-02-23 (×4): 1 mg via INTRAVENOUS
  Filled 2019-02-22 (×4): qty 1

## 2019-02-22 NOTE — Progress Notes (Signed)
Interventional Radiology Progress Note  Consulted by Dr. Brett Albino yesterday regarding bilateral nephrostomy tube placement. Patient presents with probable urosepsis and AKI from progressive bladder carcinoma causing obstruction of bilateral indwelling ureteral stents with progressive bilateral hydronephrosis by CT yesterday.  Patient on Eliquis for history of atrial flutter and last dose yesterday, 4/16, at 1130.   Indication for bilateral nephrostomy tube placement but will have to wait until tomorrow to adequately decrease bleeding risk especially given that he needs a bilateral procedure and his age and condition.   Plan: OK to start IV heparin. Hold Eliquis. Will order heparin to be turned off around 0600 tomorrow. Check PT/INR in AM given INR of 1.6 yesterday. Will plan to place bilateral nephrostomy tubes around noon tomorrow. Dr. Laurence Ferrari on call for our service this weekend.  Venetia Night. Kathlene Cote, M.D Pager:  [phone]

## 2019-02-22 NOTE — Progress Notes (Signed)
Physical Therapy Evaluation Patient Details Name: SHEA KAPUR MRN: [id number] DOB: [birth year] Today's Date: 02/22/2019   History of Present Illness  Lamonte Hartt  is a 84 y.o. male with a known history of bladder cancer with bilateral indwelling stents and chronic indwelling foley  who was directly admitted to hospital on 02/21/2019 from the urology office for worsening leucocytosis in the setting of worsening b/l hydronephrosis, ureteral stents and foley catheter. He as been recently treated for enterococcus fecalis in the urine by his oncologist first with Augmentin and then levaquin. Relevant PMH includes DVT diagnosed 01/04/2019,  GERD, anxiety, arthrits, history of atrial flutter, R femur fracture, HTN, prostate cancer, umbilical hernia, wound eschar L of foot, urinary retention with urinary catheter, multiple bladder surgeries/procedures.     Clinical Impression  Prior to hospitalization, patient reports he ambulated with assist using RW and required help with ADLs and IADLs. Upon physical therapy evaluation, patient required min A for supine to sit, was able to complete sit <> stand with min A from/to elevated bed and from elevated bed to chair, and ambulated approximate 20 feet with RW and min A +2 second person for chair follow. Patient demo stooped posture, quick fatigue, backward lean at times, and shuffling gait. He was not able to attempt stairs at initial visit, but has stairs to enter his home. Patient would benefit from physical therapy to address impairments and functional limitations to work return to PLOF or maximal functional independence. Patient would benefit from short term rehab following discharge from hospital to improve functional independence prior to returning home.     Follow Up Recommendations SNF    Equipment Recommendations  3in1 (PT)    Recommendations for Other Services OT consult     Precautions / Restrictions Precautions Precautions:  Fall Restrictions Weight Bearing Restrictions: No      Mobility  Bed Mobility Overal bed mobility: Needs Assistance Bed Mobility: Supine to Sit     Supine to sit: Min assist     General bed mobility comments: help for advancing legs off bed; provided hand held assistance to stablize trunk  Transfers Overall transfer level: Needs assistance Equipment used: Rolling walker (2 wheeled) Transfers: Sit to/from Stand Sit to Stand: Min assist;From elevated surface         General transfer comment: sit <> stand with min A from/to elevated bed and from elevated bed to chair.   Ambulation/Gait Ambulation/Gait assistance: +2 safety/equipment Gait Distance (Feet): 20 Feet Assistive device: Rolling walker (2 wheeled) Gait Pattern/deviations: Shuffle;Decreased stride length;Trunk flexed Gait velocity: decreased   General Gait Details: ambulated approximate 20 feet with RW and min A +2 second person for chair follow. Patient demo stooped posture, quick fatigue, backward lean at times, shuffling gait.   Stairs            Wheelchair Mobility    Modified Rankin (Stroke Patients Only)       Balance Overall balance assessment: Needs assistance Sitting-balance support: Bilateral upper extremity supported;Feet supported Sitting balance-Leahy Scale: Fair Sitting balance - Comments: able to sit at edge of bed with BUE support   Standing balance support: Bilateral upper extremity supported Standing balance-Leahy Scale: Poor Standing balance comment: Requires BUE support for standing balance.                              Pertinent Vitals/Pain Pain Assessment: Faces Faces Pain Scale: Hurts little more Pain Location: reports abdominal pain and leg  pain when touched or when moving. no pain reported at rest.  Pain Intervention(s): Monitored during session    [place] expects to be discharged to:: Private residence Living Arrangements:  Spouse/significant other;Children;Other relatives Available Help at Discharge: Family;Available 24 hours/day Type of Home: Mobile home Home Access: Stairs to enter Entrance Stairs-Rails: Left Entrance Stairs-Number of Steps: 4-5 Home Layout: One level Home Equipment: Walker - 2 wheels      Prior Function Level of Independence: Needs assistance   Gait / Transfers Assistance Needed: transfers with assist from family to RW. Ambulates with assistance with RW.   ADL's / Homemaking Assistance Needed: patient requires help with dressing, bathing at times. Requires help with IADLs.   Comments: Pt.'s wife, son, and daughter in law assists with meal preparation, medication management, and transportation. Patient sleeps in recliner.      Hand Dominance   Dominant Hand: Right    Extremity/Trunk Assessment   Upper Extremity Assessment Upper Extremity Assessment: Generalized weakness    Lower Extremity Assessment Lower Extremity Assessment: Generalized weakness    Cervical / Trunk Assessment Cervical / Trunk Assessment: Other exceptions Cervical / Trunk Exceptions: forward flexed posture. States he cannot stand up straight.   Communication   Communication: HOH  Cognition Arousal/Alertness: Awake/alert Behavior During Therapy: WFL for tasks assessed/performed;Anxious(tearful at times) Overall Cognitive Status: No family/caregiver present to determine baseline cognitive functioning                                 General Comments: Limited in anxiety at times.       General Comments      Exercises     Assessment/Plan    PT Assessment Patient needs continued PT services  PT Problem List Decreased strength;Decreased mobility;Decreased safety awareness;Decreased range of motion;Decreased coordination;Decreased knowledge of precautions;Decreased activity tolerance;Cardiopulmonary status limiting activity;Decreased balance;Decreased knowledge of use of  DME;Pain;Decreased cognition       PT Treatment Interventions DME instruction;Functional mobility training;Balance training;Patient/family education;Gait training;Therapeutic activities;Neuromuscular re-education;Stair training;Therapeutic exercise;Cognitive remediation    PT Goals (Current goals can be found in the Care Plan section)  Acute Rehab PT Goals Patient Stated Goal: return home, get better PT Goal Formulation: With patient Time For Goal Achievement: 03/08/19 Potential to Achieve Goals: Fair    Frequency Min 2X/week   Barriers to discharge Inaccessible home environment patient requires physical assistance with basic mobility    Co-evaluation               AM-PAC PT "6 Clicks" Mobility  Outcome Measure Help needed turning from your back to your side while in a flat bed without using bedrails?: A Little Help needed moving from lying on your back to sitting on the side of a flat bed without using bedrails?: A Little Help needed moving to and from a bed to a chair (including a wheelchair)?: A Little Help needed standing up from a chair using your arms (e.g., wheelchair or bedside chair)?: A Little Help needed to walk in hospital room?: A Little Help needed climbing 3-5 steps with a railing? : Total 6 Click Score: 16    End of Session Equipment Utilized During Treatment: Gait belt Activity Tolerance: Patient tolerated treatment well;Patient limited by fatigue Patient left: in chair;with call bell/phone within reach;with chair alarm set Nurse Communication: Mobility status(results of session) PT Visit Diagnosis: Unsteadiness on feet (R26.81);Muscle weakness (generalized) (M62.81);Difficulty in walking, not elsewhere classified (R26.2)    Time:  8472-0721 PT Time Calculation (min) (ACUTE ONLY): 32 min   Charges:   PT Evaluation $PT Eval Moderate Complexity: 1 Mod PT Treatments $Gait Training: 8-22 mins        Everlean Alstrom. Graylon Good, PT, DPT 02/22/19, 4:32 PM

## 2019-02-22 NOTE — Progress Notes (Signed)
Date of Admission:  02/21/2019     ID: Vincent Poole is a 84 y.o. male  Active Problems:   Leukocytosis    Subjective: Sitting in chair Feeling a little cold  Medications:  . ferrous sulfate  325 mg Oral Q breakfast  . loratadine  10 mg Oral Daily  . mirabegron ER  50 mg Oral Daily  . multivitamin with minerals  1 tablet Oral Daily  . vitamin B-12  500 mcg Oral Daily    Objective: Vital signs in last 24 hours: Temp:  [97.8 F (36.6 C)-98.7 F (37.1 C)] 98.7 F (37.1 C) (04/17 0828) Pulse Rate:  [85-97] 85 (04/17 0828) Resp:  [18-20] 20 (04/17 0828) BP: (113-127)/(59-79) 113/62 (04/17 0828) SpO2:  [97 %-100 %] 97 % (04/17 0828) Weight:  [72.3 kg] 72.3 kg (04/16 1439)  PHYSICAL EXAM:  General: Alert, cooperative, no distress, appears stated age. Hard of hearing Lungs: b/l air entry Heart: s1s2 Abdomen: Soft,  Extremities: atraumatic, no cyanosis. No edema. No clubbing, involuntary tremor left arm Skin: No rashes or lesions. Or bruising Lymph: Cervical, supraclavicular normal. Neurologic: Grossly non-focal  Lab Results Recent Labs    02/21/19 1517 02/22/19 0343  WBC 36.5* 34.7*  HGB 10.3* 9.5*  HCT 32.7* 29.6*  NA 135 138  K 4.8 4.6  CL 105 110  CO2 19* 19*  BUN 37* 38*  CREATININE 2.62* 2.86*   Liver Panel Recent Labs    02/21/19 1517  PROT 6.8  ALBUMIN 2.9*  AST 13*  ALT 8  ALKPHOS 91  BILITOT 0.5   Sedimentation Rate No results for input(s): ESRSEDRATE in the last 72 hours. C-Reactive Protein No results for input(s): CRP in the last 72 hours.  Microbiology:  Studies/Results: Ct Abdomen Pelvis Wo Contrast  Result Date: 02/22/2019 CLINICAL DATA:  History of DVT and urothelial carcinoma with bilateral hydronephrosis EXAM: CT ABDOMEN AND PELVIS WITHOUT CONTRAST TECHNIQUE: Multidetector CT imaging of the abdomen and pelvis was performed following the standard protocol without IV contrast. COMPARISON:  02/08/2019 FINDINGS: Lower chest: No  acute abnormality. Hepatobiliary: No focal liver abnormality is seen. Status post cholecystectomy. No biliary dilatation. Pancreas: Unremarkable. No pancreatic ductal dilatation or surrounding inflammatory changes. Spleen: Normal in size without focal abnormality. Adrenals/Urinary Tract: Adrenal glands are within normal limits bilaterally. Chronic bilateral hydronephrosis is noted with ureteral stents in place. The bladder is decompressed by Foley catheter although mottled air and soft tissue density is noted within the bladder consistent with the patient's known mass lesion and possibly some underlying thrombus. Air is noted within the collecting systems bilaterally likely retrograde passage from the bladder. Right renal cyst is again noted. Stomach/Bowel: The appendix is within normal limits. No obstructive or inflammatory changes of large or small bowel are noted. The stomach is decompressed. Vascular/Lymphatic: Aortic atherosclerosis. No enlarged abdominal or pelvic lymph nodes. Reproductive: Prostate is unremarkable. Other: No abdominal wall hernia or abnormality. No abdominopelvic ascites. Musculoskeletal: Degenerative changes of the lumbar spine are noted. Postsurgical changes in the proximal right femur are seen. IMPRESSION: Bilateral ureteral stents in satisfactory position with evidence of stable chronic hydronephrosis bilaterally. Increased mottled density in the bladder is noted consistent with the underlying neoplasm. The possibility of some underlying thrombus deserves consideration as well. Correlation with urine is recommended. Foley catheter is seen in place. Chronic changes as described above. Electronically Signed   By: Inez Catalina M.D.   On: 02/22/2019 00:50   Dg Chest 2 View  Result Date: 02/21/2019 CLINICAL  DATA:  Leukocytosis and hypertension EXAM: CHEST - 2 VIEW COMPARISON:  11/12/2018 FINDINGS: Calcified pleural plaques are noted bilaterally. Right jugular Port-A-Cath is stable with its  tip at the lower SVC. Normal heart size. No pneumothorax or pleural effusion. No new consolidation or lung mass. IMPRESSION: No active cardiopulmonary disease. Electronically Signed   By: Marybelle Killings M.D.   On: 02/21/2019 15:51     Assessment/Plan: Severe leucoytosis in the setting of bladder cancer with b/l uretral obstruction, b/l stents and b/l hydroureteronephrosis with rt side being new and worsening. This could be due to infection  or the leucocytosis could be due to the tumor burden being a reactive process-No change with antibiotics in 24 hrs  He will need perc nephrostomy to relieve the obstruction and infected urine . He had enterococcus in the urine twice before but has had e.coli last year. continue zosyn  Pt does not look septic and is very stable  AKI on CKD- could be duet to d/l obstruction  Anemia   ?Recent DVT rt popliteal vein- was on eliquis which is on hold until the procedure tomorrow  ID will follow peripherally this weekend- Call if needed

## 2019-02-22 NOTE — Consult Note (Signed)
Pharmacy Antibiotic Note  Vincent Poole is a 84 y.o. male admitted on 02/21/2019 with worsening leukocytosis and urine as a possible source.  Pharmacy has been consulted for Zosyn dosing, which infectious disease is recommending. SCr is elevated above his baseline level of approximately 1.1 mg/dL  Plan: Zosyn 3.375g IV q8h (4 hour infusion).  Height: 5\' 11"  (180.3 cm) Weight: 159 lb 8 oz (72.3 kg) IBW/kg (Calculated) : 75.3  Temp (24hrs), Avg:98.2 F (36.8 C), Min:97.8 F (36.6 C), Max:98.7 F (37.1 C)  Recent Labs  Lab 02/20/19 1404 02/21/19 1517 02/22/19 0343  WBC 45.7* 36.5* 34.7*  CREATININE  --  2.62* 2.86*  LATICACIDVEN  --  1.8  --     Estimated Creatinine Clearance: 20.7 mL/min (A) (by C-G formula based on SCr of 2.86 mg/dL (H)).    No Known Allergies  Antimicrobials this admission: Zosyn 4/16 >>   Microbiology results: 4/16 BCx: pending 4/16 UCx: pending   Thank you for allowing pharmacy to be a part of this patient's care.  Oswald Hillock, PharmD, BCPS 02/22/2019 1:34 PM

## 2019-02-22 NOTE — Progress Notes (Signed)
Initial Nutrition Assessment  RD working remotely.  DOCUMENTATION CODES:   Not applicable  INTERVENTION:   - Add Ensure Enlive TID (each provides 350 kcal, 20 g protein)  - Continue vitamin and mineral supplementation  NUTRITION DIAGNOSIS:   Increased nutrient needs related to chronic illness as evidenced by estimated needs.  GOAL:   Patient will meet greater than or equal to 90% of their needs  MONITOR:   PO intake, Supplement acceptance, Weight trends, Labs, I & O's  REASON FOR ASSESSMENT:   Consult Assessment of nutrition requirement/status  ASSESSMENT:   84 yo male, admitted with leukocytosis. PMH significant for bladder cancer, HTN, GERD, DVT, iron deficiency anemia, h/o wound eschar of foot.  Per 4/17 radiology note, plan for BL nephrostomy tube placement when medically able.   RD unable to reach patient via phone. Per chart review, significant wt loss x 1 month -  suspect malnutrition, unable to dx at this time. Will order Ensure Enlive TID and continue to monitor adequacy of intake.  Labs: glucose 101 mg/dL, BUN 38 (H), Creatinine 2.86 (H) Meds: ferrous sulfate, MVI with minerals, vitamin B-12  NUTRITION - FOCUSED PHYSICAL EXAM: Deferred - RD working remotely.  Diet Order:  No known food allergies Diet Order            Diet NPO time specified  Diet effective midnight        Diet NPO time specified  Diet effective midnight        Diet regular Room service appropriate? Yes; Fluid consistency: Thin  Diet effective now            60% x 1 meal recorded on 4/16  EDUCATION NEEDS:  No education needs have been identified at this time  Skin:  Skin Assessment: Skin Integrity Issues: Skin Integrity Issues:: Other (Comment) Other: Ecchymosis - hands, arms  Last BM:  PTA  Height:  Ht Readings from Last 1 Encounters:  02/21/19 5\' 11"  (1.803 m)    Weight:  Wt Readings from Last 10 Encounters:  02/21/19 72.3 kg  02/04/19 83 kg  01/10/19 81.6 kg   01/03/19 80.3 kg  12/14/18 79.8 kg  12/13/18 83.9 kg  11/23/18 79.4 kg  11/16/18 87.6 kg  11/08/18 77.2 kg  10/23/18 76.2 kg  Wt variable x 4 months 9.3 kg wt loss x 1 month = 11%, severe  Ideal Body Weight:  78.2 kg  BMI:  Body mass index is 22.25 kg/m. normal  Estimated Nutritional Needs:   Kcal:  2160-2520 (30-35 kcal/kg)  Protein:  86-108 gm (1.2-1.5 g/kg)  Fluid:  1 mL/kcal or per MD  Althea Grimmer, MS, RDN, LDN Pager: [phone] Available Mondays and Fridays, 9am-2pm

## 2019-02-22 NOTE — Progress Notes (Addendum)
PT Cancellation Note  Patient Details Name: Vincent Poole MRN: [id number] DOB: 02/23/1935   Cancelled Treatment:    Reason Eval/Treat Not Completed: Pain limiting ability to participate;Patient declined, no reason specified(patient shaking, reporting intolerable pain at abdomen, highly anxious, states unable to participate, mildly confused. Not appropriate for physical therapy at this time. Notified nursing of requests for pain medication and water.  O2 sats and HR WFL)   Everlean Alstrom. Graylon Good, PT, DPT 02/22/19, 11:41 AM

## 2019-02-22 NOTE — Consult Note (Signed)
ANTICOAGULATION CONSULT NOTE - Initial Consult  Pharmacy Consult for heparin drip management Indication: VTE prophylaxis  No Known Allergies  Patient Measurements: Height: 5\' 11"  (180.3 cm) Weight: 159 lb 8 oz (72.3 kg) IBW/kg (Calculated) : 75.3  Vital Signs: Temp: 98.6 F (37 C) (04/17 2100) Temp Source: Oral (04/17 2100) BP: 135/59 (04/17 2100) Pulse Rate: 105 (04/17 2100)  Labs: Recent Labs    02/20/19 1404 02/21/19 1517 02/21/19 1619 02/22/19 0343 02/22/19 0753 02/22/19 2109  HGB 11.6* 10.3*  --  9.5*  --   --   HCT 36.5* 32.7*  --  29.6*  --   --   PLT 461* 335  --  319  --   --   APTT  --   --  51*  --  45* 52*  LABPROT  --   --  19.2*  --   --   --   INR  --   --  1.6*  --   --   --   HEPARINUNFRC  --   --  >3.60*  --  1.81*  --   CREATININE  --  2.62*  --  2.86*  --   --     Estimated Creatinine Clearance: 20.7 mL/min (A) (by C-G formula based on SCr of 2.86 mg/dL (H)).   Medical History: Past Medical History:  Diagnosis Date  . Anxiety   . Arthritis   . Bladder cancer ([place])   . Dysrhythmia 07/2018   history of atrial flutter that worsens with anxiety  . Femur fracture, right ([place]) 05/01/2018  . GERD (gastroesophageal reflux disease)   . History of recent blood transfusion 05/2018  . Hypertension   . Iron deficiency anemia 09/14/2018  . Prostate cancer ([place]) 07/2018   cancer growing in prostate but not prostate cancer, it is from the bladder  . Umbilical hernia 57/3220  . Urinary retention 2019   foley catheter place 11/2017  . UTI (urinary tract infection) 2019   frequent UTI's over last year  . Wound eschar of foot 07/2018   left heal getting wrapped and requiring antibiotic cream. cracks open with weight bearing.    Assessment: 84 year-old male who has been taking apixaban for a DVT that was diagnosed 01/04/19. He has a h/o Hhigh-grade urothelial carcinoma with bilateral indwelling stents and chronic indwelling foley due to chronic bilateral  hydronephrosis. IR to place bilateral nephrostomy tubes, plan to remove bilateral stents and foley after placement of nephrostomy tubes, possibly on 4/17 but she will be evaluated in the morning. Baseline Hgb 11.6, PLT 461, other baseline labs have been ordered but not yet resulted. He reports his last dose of apixaban today at approximately noon.  -Start heparin infusion at 1100 units/hr beginning at midnight -Check aPTT level in 8 hours after infusion begins and HL daily while on heparin until labs correlate -Continue to monitor H&H and platelets  Goal of Therapy:  Heparin level 0.3-0.7 units/ml aPTT 66-102s Monitor platelets by anticoagulation protocol: Yes   Plan:  4/17:  APTT @ 2109= 52. Will increase Heparin drip to 1200 units/hr. Will recheck aPTT in 8 hours and a Heparin level.  Noralee Space, PharmD 02/22/2019,9:46 PM

## 2019-02-22 NOTE — Progress Notes (Signed)
Prn x 2 for c/o lower abd pain. Morphine.  Some n/v  zofran given this late pm.  Foley cont chronically. Heparin drip started. Npo after mn for  neph tube placement  At noon tomorrow. Pt aware. Chair  This pm. tol well.

## 2019-02-22 NOTE — Progress Notes (Signed)
[facility] [place] NAME: Vincent Poole    MR#:  [id number]  DATE OF BIRTH:  Mar 28, 1935  SUBJECTIVE:  Patient without complaint, no events overnight, n.p.o. after midnight for planned bilateral nephrostomy tube placement on tomorrow  REVIEW OF SYSTEMS:  CONSTITUTIONAL: No fever, fatigue or weakness.  EYES: No blurred or double vision.  EARS, NOSE, AND THROAT: No tinnitus or ear pain.  RESPIRATORY: No cough, shortness of breath, wheezing or hemoptysis.  CARDIOVASCULAR: No chest pain, orthopnea, edema.  GASTROINTESTINAL: No nausea, vomiting, diarrhea or abdominal pain.  GENITOURINARY: No dysuria, hematuria.  ENDOCRINE: No polyuria, nocturia,  HEMATOLOGY: No anemia, easy bruising or bleeding SKIN: No rash or lesion. MUSCULOSKELETAL: No joint pain or arthritis.   NEUROLOGIC: No tingling, numbness, weakness.  PSYCHIATRY: No anxiety or depression.   ROS  DRUG ALLERGIES:  No Known Allergies  VITALS:  Blood pressure 113/62, pulse 85, temperature 98.7 F (37.1 C), temperature source Oral, resp. rate 20, height 5\' 11"  (1.803 m), weight 72.3 kg, SpO2 97 %.  PHYSICAL EXAMINATION:  GENERAL:  84 y.o.-year-old patient lying in the bed with no acute distress.  Malnourished/frail appearance EYES: Pupils equal, round, reactive to light and accommodation. No scleral icterus. Extraocular muscles intact.  HEENT: Head atraumatic, normocephalic. Oropharynx and nasopharynx clear.  NECK:  Supple, no jugular venous distention. No thyroid enlargement, no tenderness.  LUNGS: Normal breath sounds bilaterally, no wheezing, rales,rhonchi or crepitation. No use of accessory muscles of respiration.  CARDIOVASCULAR: S1, S2 normal. No murmurs, rubs, or gallops.  ABDOMEN: Soft, nontender, nondistended. Bowel sounds present. No organomegaly or mass.  EXTREMITIES: No pedal edema, cyanosis, or clubbing.  Diffuse muscular atrophy NEUROLOGIC: Cranial nerves II through XII  are intact. Muscle strength 5/5 in all extremities. Sensation intact. Gait not checked.  Hard of hearing PSYCHIATRIC: The patient is alert and oriented x 2 - 3.  SKIN: No obvious rash, lesion, or ulcer.   Physical Exam LABORATORY PANEL:   CBC Recent Labs  Lab 02/22/19 0343  WBC 34.7*  HGB 9.5*  HCT 29.6*  PLT 319   ------------------------------------------------------------------------------------------------------------------  Chemistries  Recent Labs  Lab 02/21/19 1517 02/22/19 0343  NA 135 138  K 4.8 4.6  CL 105 110  CO2 19* 19*  GLUCOSE 116* 101*  BUN 37* 38*  CREATININE 2.62* 2.86*  CALCIUM 8.8* 8.6*  AST 13*  --   ALT 8  --   ALKPHOS 91  --   BILITOT 0.5  --    ------------------------------------------------------------------------------------------------------------------  Cardiac Enzymes No results for input(s): TROPONINI in the last 168 hours. ------------------------------------------------------------------------------------------------------------------  RADIOLOGY:  Ct Abdomen Pelvis Wo Contrast  Result Date: 02/22/2019 CLINICAL DATA:  History of DVT and urothelial carcinoma with bilateral hydronephrosis EXAM: CT ABDOMEN AND PELVIS WITHOUT CONTRAST TECHNIQUE: Multidetector CT imaging of the abdomen and pelvis was performed following the standard protocol without IV contrast. COMPARISON:  02/08/2019 FINDINGS: Lower chest: No acute abnormality. Hepatobiliary: No focal liver abnormality is seen. Status post cholecystectomy. No biliary dilatation. Pancreas: Unremarkable. No pancreatic ductal dilatation or surrounding inflammatory changes. Spleen: Normal in size without focal abnormality. Adrenals/Urinary Tract: Adrenal glands are within normal limits bilaterally. Chronic bilateral hydronephrosis is noted with ureteral stents in place. The bladder is decompressed by Foley catheter although mottled air and soft tissue density is noted within the bladder consistent  with the patient's known mass lesion and possibly some underlying thrombus. Air is noted within the collecting systems bilaterally likely retrograde passage from the  bladder. Right renal cyst is again noted. Stomach/Bowel: The appendix is within normal limits. No obstructive or inflammatory changes of large or small bowel are noted. The stomach is decompressed. Vascular/Lymphatic: Aortic atherosclerosis. No enlarged abdominal or pelvic lymph nodes. Reproductive: Prostate is unremarkable. Other: No abdominal wall hernia or abnormality. No abdominopelvic ascites. Musculoskeletal: Degenerative changes of the lumbar spine are noted. Postsurgical changes in the proximal right femur are seen. IMPRESSION: Bilateral ureteral stents in satisfactory position with evidence of stable chronic hydronephrosis bilaterally. Increased mottled density in the bladder is noted consistent with the underlying neoplasm. The possibility of some underlying thrombus deserves consideration as well. Correlation with urine is recommended. Foley catheter is seen in place. Chronic changes as described above. Electronically Signed   By: Inez Catalina M.D.   On: 02/22/2019 00:50   Dg Chest 2 View  Result Date: 02/21/2019 CLINICAL DATA:  Leukocytosis and hypertension EXAM: CHEST - 2 VIEW COMPARISON:  11/12/2018 FINDINGS: Calcified pleural plaques are noted bilaterally. Right jugular Port-A-Cath is stable with its tip at the lower SVC. Normal heart size. No pneumothorax or pleural effusion. No new consolidation or lung mass. IMPRESSION: No active cardiopulmonary disease. Electronically Signed   By: Marybelle Killings M.D.   On: 02/21/2019 15:51    ASSESSMENT AND PLAN:  *Acute severe leukocytosis Treated with augmentin x several weeks and then levaquin x 2 weeks for presumed UTI Suspected due to failed outpatient complicated UTI given chronic bladder cancer with bilateral ureteral stents with chronic indwelling Foley Infectious disease input  greatly appreciated, continue empiric Zosyn, follow-up on outstanding cultures  *Chronic high-grade urothelial carcinoma with bilateral indwelling stents and chronic indwelling foley due to chronic bilateral hydronephrosis  CT abdomen:Bilateral ureteral stents in satisfactory position with evidence of stable chronic hydronephrosis bilaterally. Increased mottled density in the bladder is noted consistent with the underlying neoplasm. The possibility of some underlying thrombus deserves consideration as well. Correlation with urine is recommended. Foley catheter is seen in place  Dr. Erlene Quan urology input appreciated-plans for bilateral nephrostomy tube placement on tomorrow by interventional radiology with subsequent removal of bilateral ureteral stents/Foley thereafter  Currently on heparin drip-will be held 6 hours prior to procedure on tomorrow morning, IR following, continue myrbetriq  *Acute renal failure Most likely secondary to bilateral hydronephrosis Avoid nephrotoxic agents, strict I&O monitoring, daily weights, BMP in the morning, operative procedure per above  *Recent DVT of right leg Diagnosed 01/04/19 Eliquis on hold for above-stated procedure, heparin drip for now   Disposition pending clinical course  All the records are reviewed and case discussed with Care Management/Social Workerr. Management plans discussed with the patient, family and they are in agreement.  CODE STATUS: full  TOTAL TIME TAKING CARE OF THIS PATIENT: 35 minutes.   POSSIBLE D/C IN 2-5 DAYS, DEPENDING ON CLINICAL CONDITION.   Avel Peace Raechal Raben M.D on 02/22/2019   Between 7am to 6pm - Pager - [phone]  After 6pm go to [email or web address] - password [email or web address] [facility]  Office  [phone]  CC: Primary care physician; [name], MD  Note: This dictation was prepared with Dragon dictation along with smaller phrase technology. Any transcriptional errors that  result from this process are unintentional.

## 2019-02-22 NOTE — TOC Initial Note (Signed)
Transition of Care [facility]) - Initial/Assessment Note    Patient Details  Name: Vincent Poole MRN: [id number] Date of Birth: 1935-06-28  Transition of Care [facility]) CM/SW Contact:    Su Hilt, RN Phone Number: 02/22/2019, 11:19 AM  Clinical Narrative:                 Met with the patient to discuss Needs and DC Plan He stated that he lives with his wife and son and daughter in law as well as minor Grandchildren He has a walker he uses at home and has grab bars and a seat in the shower, he states he does not need any more equipment He is to go for procedure tomorrow of Sunday He is not familiar with how to call for a food tray, this CM assisted the patient in calling for a food tray He was very tearful due to feeling frustrated and alone since his family can't visit him while he is here and they are the ones that usually call for food trays and make sure he has what he needs, I allowed him to stress his frustrations and fears and offered support and empathy He said he felt better since I called and helped him order his lunch He stated that he has transportation with his family and they always make sure that he gets to the places he needs to go He sees Dr. Netty Starring as PCP [facility] And can afford his medications with [employer] [facility] and [employer] Continue to follow for needs  Expected Discharge Plan: [place] Barriers to Discharge: Continued Medical Work up   Patient Goals and CMS Choice Patient states their goals for this hospitalization and ongoing recovery are:: get better      Expected Discharge Plan and Services Expected Discharge Plan: [place]   Discharge Planning Services: CM Consult   Living arrangements for the past 2 months: Single Family Home                 DME Arranged: (states that he has no needs)        Prior Living Arrangements/Services Living arrangements for the past 2 months: Single Family Home Lives  with:: Spouse, Adult Children, Relatives Patient language and need for interpreter reviewed:: No Do you feel safe going back to the place where you live?: Yes      Need for Family Participation in Patient Care: Yes (Comment) Care giver support system in place?: Yes (comment) Current home services: DME(walker, grab bars) Criminal Activity/Legal Involvement Pertinent to Current Situation/Hospitalization: No - Comment as needed  Activities of Daily Living Home Assistive Devices/Equipment: Environmental consultant (specify type), Hearing aid, Dentures (specify type) ADL Screening (condition at time of admission) Patient's cognitive ability adequate to safely complete daily activities?: Yes Is the patient deaf or have difficulty hearing?: No Does the patient have difficulty seeing, even when wearing glasses/contacts?: No Does the patient have difficulty concentrating, remembering, or making decisions?: No Patient able to express need for assistance with ADLs?: Yes Does the patient have difficulty dressing or bathing?: No Independently performs ADLs?: Yes (appropriate for developmental age) Does the patient have difficulty walking or climbing stairs?: No Weakness of Legs: None Weakness of Arms/Hands: None  Permission Sought/Granted   Permission granted to share information with : Yes, Verbal Permission Granted     Permission granted to share info w AGENCY: any home health agency        Emotional Assessment  Appearance:: Appears stated age Attitude/Demeanor/Rapport: Engaged Affect (typically observed): Accepting Orientation: : Oriented to Self, Oriented to Place, Oriented to  Time, Oriented to Situation Alcohol / Substance Use: Never Used Psych Involvement: No (comment)  Admission diagnosis:  Hydronephrosis Patient Active Problem List   Diagnosis Date Noted  . Leukocytosis 02/21/2019  . Complicated UTI (urinary tract infection) 11/14/2018  . Palliative care encounter   . Iron deficiency anemia  09/14/2018  . Hip fracture ([place]) 05/01/2018  . Malignant neoplasm of urinary bladder ([place]) 01/12/2018  . Goals of care, counseling/discussion 01/12/2018  . Pyelonephritis 05/31/2017  . UTI (urinary tract infection) 03/04/2017  . Urinary obstruction   . Essential hypertension 08/30/2016  . History of shingles 08/30/2016  . Prostate cancer ([place]) 08/30/2016  . Urinary retention 08/30/2016  . Medicare annual wellness visit, initial 08/01/2016  . Medicare annual wellness visit, subsequent 08/01/2016  . Sepsis ([place]) 07/11/2016  . Borderline diabetes mellitus 01/26/2016  . Vaccine counseling 01/26/2015  . Lumbar radiculitis 06/24/2014  . OA (osteoarthritis) of hip 06/24/2014  . Malignant neoplasm of prostate ([place]) 01/27/2011   PCP:  [name], MD Pharmacy:   [facility] [address] [place] [address] Phone: [phone] Fax: [phone]  [place], [place] [address] Phone: [phone] Fax: [phone]     Social Determinants of Health (SDOH) Interventions    Readmission Risk Interventions No flowsheet data found.

## 2019-02-23 ENCOUNTER — Inpatient Hospital Stay: Payer: Medicare Other

## 2019-02-23 ENCOUNTER — Encounter: Payer: Self-pay | Admitting: Interventional Radiology

## 2019-02-23 HISTORY — PX: IR NEPHROSTOMY PLACEMENT LEFT: IMG6063

## 2019-02-23 HISTORY — PX: IR NEPHROSTOMY PLACEMENT RIGHT: IMG6064

## 2019-02-23 LAB — CBC
HCT: 29.1 % — ABNORMAL LOW (ref 39.0–52.0)
Hemoglobin: 9.2 g/dL — ABNORMAL LOW (ref 13.0–17.0)
MCH: 30.7 pg (ref 26.0–34.0)
MCHC: 31.6 g/dL (ref 30.0–36.0)
MCV: 97 fL (ref 80.0–100.0)
Platelets: 253 10*3/uL (ref 150–400)
RBC: 3 MIL/uL — ABNORMAL LOW (ref 4.22–5.81)
RDW: 14.7 % (ref 11.5–15.5)
WBC: 36.3 10*3/uL — ABNORMAL HIGH (ref 4.0–10.5)
nRBC: 0 % (ref 0.0–0.2)

## 2019-02-23 LAB — BASIC METABOLIC PANEL
Anion gap: 9 (ref 5–15)
BUN: 38 mg/dL — ABNORMAL HIGH (ref 8–23)
CO2: 18 mmol/L — ABNORMAL LOW (ref 22–32)
Calcium: 8.3 mg/dL — ABNORMAL LOW (ref 8.9–10.3)
Chloride: 111 mmol/L (ref 98–111)
Creatinine, Ser: 3.46 mg/dL — ABNORMAL HIGH (ref 0.61–1.24)
GFR calc Af Amer: 18 mL/min — ABNORMAL LOW (ref >60)
GFR calc non Af Amer: 16 mL/min — ABNORMAL LOW (ref >60)
Glucose, Bld: 106 mg/dL — ABNORMAL HIGH (ref 70–99)
Potassium: 4.5 mmol/L (ref 3.5–5.1)
Sodium: 138 mmol/L (ref 135–145)

## 2019-02-23 LAB — HEPARIN LEVEL (UNFRACTIONATED): Heparin Unfractionated: 1.16 IU/mL — ABNORMAL HIGH (ref 0.30–0.70)

## 2019-02-23 LAB — URINE CULTURE: Culture: NO GROWTH

## 2019-02-23 LAB — APTT: aPTT: 43 seconds — ABNORMAL HIGH (ref 24–36)

## 2019-02-23 LAB — PROTIME-INR
INR: 1.3 — ABNORMAL HIGH (ref 0.8–1.2)
Prothrombin Time: 16 seconds — ABNORMAL HIGH (ref 11.4–15.2)

## 2019-02-23 MED ORDER — FENTANYL CITRATE (PF) 100 MCG/2ML IJ SOLN
INTRAMUSCULAR | Status: AC | PRN
  Administered 2019-02-23: 50 ug via INTRAVENOUS
  Administered 2019-02-23: 25 ug via INTRAVENOUS

## 2019-02-23 MED ORDER — MIDAZOLAM HCL 5 MG/5ML IJ SOLN
INTRAMUSCULAR | Status: AC | PRN
  Administered 2019-02-23: 1 mg via INTRAVENOUS

## 2019-02-23 MED ORDER — MIDAZOLAM HCL 5 MG/5ML IJ SOLN
INTRAMUSCULAR | Status: AC
  Filled 2019-02-23: qty 5

## 2019-02-23 MED ORDER — LIDOCAINE HCL (PF) 1 % IJ SOLN
INTRAMUSCULAR | Status: AC
  Filled 2019-02-23: qty 30

## 2019-02-23 MED ORDER — FENTANYL CITRATE (PF) 100 MCG/2ML IJ SOLN
INTRAMUSCULAR | Status: AC
  Filled 2019-02-23: qty 2

## 2019-02-23 MED ORDER — IOHEXOL 300 MG/ML  SOLN: 30.0000 mL | Freq: Once | INTRAMUSCULAR | Status: DC | PRN

## 2019-02-23 MED ORDER — PIPERACILLIN-TAZOBACTAM 3.375 G IVPB
3.3750 g | Freq: Two times a day (BID) | INTRAVENOUS | Status: DC
  Administered 2019-02-23 – 2019-02-25 (×4): 3.375 g via INTRAVENOUS
  Filled 2019-02-23 (×4): qty 50

## 2019-02-23 NOTE — Progress Notes (Signed)
Patient's hand was caught between procedure table and bed during transfer. Large skin tear noted and bruising. Hand dressed with vaseline dressing, 4x4 gauze and tegaderm. MD made aware.

## 2019-02-23 NOTE — Progress Notes (Signed)
[place] [facility] [place] NAME: Vincent Poole    MR#:  [id number]  DATE OF BIRTH:  19-May-1935  SUBJECTIVE:   No acute events overnight, plan for bilateral nephrostomy tubes placement, removal of ureteral stents and replacement of Foley cath today by IR.    REVIEW OF SYSTEMS:    Review of Systems  Constitutional: Negative for chills and fever.  HENT: Negative for congestion and tinnitus.   Eyes: Negative for blurred vision and double vision.  Respiratory: Negative for cough, shortness of breath and wheezing.   Cardiovascular: Negative for chest pain, orthopnea and PND.  Gastrointestinal: Negative for abdominal pain, diarrhea, nausea and vomiting.  Genitourinary: Negative for dysuria and hematuria.  Neurological: Negative for dizziness, sensory change and focal weakness.  All other systems reviewed and are negative.   Nutrition: NPO for procedure today Tolerating Diet: Yes Tolerating PT: Await Eval.    DRUG ALLERGIES:  No Known Allergies  VITALS:  Blood pressure 114/69, pulse (!) 104, temperature 98.6 F (37 C), temperature source Oral, resp. rate 18, height 5\' 11"  (1.803 m), weight 79.1 kg, SpO2 100 %.  PHYSICAL EXAMINATION:   Physical Exam  GENERAL:  84 y.o.-year-old patient lying in bed in no acute distress.  EYES: Pupils equal, round, reactive to light and accommodation. No scleral icterus. Extraocular muscles intact.  HEENT: Head atraumatic, normocephalic. Oropharynx and nasopharynx clear.  NECK:  Supple, no jugular venous distention. No thyroid enlargement, no tenderness.  LUNGS: Normal breath sounds bilaterally, no wheezing, rales, rhonchi. No use of accessory muscles of respiration.  CARDIOVASCULAR: S1, S2 normal. No murmurs, rubs, or gallops.  ABDOMEN: Soft, nontender, nondistended. Bowel sounds present. No organomegaly or mass.  EXTREMITIES: No cyanosis, clubbing or edema b/l.    NEUROLOGIC: Cranial nerves II through  XII are intact. No focal Motor or sensory deficits b/l.  Globally weak.  PSYCHIATRIC: The patient is alert and oriented x 3.  SKIN: No obvious rash, lesion, or ulcer.   Chronic indwelling Foley with Yellow urine draining.   LABORATORY PANEL:   CBC Recent Labs  Lab 02/23/19 0619  WBC 36.3*  HGB 9.2*  HCT 29.1*  PLT 253   ------------------------------------------------------------------------------------------------------------------  Chemistries  Recent Labs  Lab 02/21/19 1517  02/23/19 0619  NA 135   < > 138  K 4.8   < > 4.5  CL 105   < > 111  CO2 19*   < > 18*  GLUCOSE 116*   < > 106*  BUN 37*   < > 38*  CREATININE 2.62*   < > 3.46*  CALCIUM 8.8*   < > 8.3*  AST 13*  --   --   ALT 8  --   --   ALKPHOS 91  --   --   BILITOT 0.5  --   --    < > = values in this interval not displayed.   ------------------------------------------------------------------------------------------------------------------  Cardiac Enzymes No results for input(s): TROPONINI in the last 168 hours. ------------------------------------------------------------------------------------------------------------------  RADIOLOGY:  Ct Abdomen Pelvis Wo Contrast  Result Date: 02/22/2019 CLINICAL DATA:  History of DVT and urothelial carcinoma with bilateral hydronephrosis EXAM: CT ABDOMEN AND PELVIS WITHOUT CONTRAST TECHNIQUE: Multidetector CT imaging of the abdomen and pelvis was performed following the standard protocol without IV contrast. COMPARISON:  02/08/2019 FINDINGS: Lower chest: No acute abnormality. Hepatobiliary: No focal liver abnormality is seen. Status post cholecystectomy. No biliary dilatation. Pancreas: Unremarkable. No pancreatic ductal dilatation or surrounding  inflammatory changes. Spleen: Normal in size without focal abnormality. Adrenals/Urinary Tract: Adrenal glands are within normal limits bilaterally. Chronic bilateral hydronephrosis is noted with ureteral stents in place. The  bladder is decompressed by Foley catheter although mottled air and soft tissue density is noted within the bladder consistent with the patient's known mass lesion and possibly some underlying thrombus. Air is noted within the collecting systems bilaterally likely retrograde passage from the bladder. Right renal cyst is again noted. Stomach/Bowel: The appendix is within normal limits. No obstructive or inflammatory changes of large or small bowel are noted. The stomach is decompressed. Vascular/Lymphatic: Aortic atherosclerosis. No enlarged abdominal or pelvic lymph nodes. Reproductive: Prostate is unremarkable. Other: No abdominal wall hernia or abnormality. No abdominopelvic ascites. Musculoskeletal: Degenerative changes of the lumbar spine are noted. Postsurgical changes in the proximal right femur are seen. IMPRESSION: Bilateral ureteral stents in satisfactory position with evidence of stable chronic hydronephrosis bilaterally. Increased mottled density in the bladder is noted consistent with the underlying neoplasm. The possibility of some underlying thrombus deserves consideration as well. Correlation with urine is recommended. Foley catheter is seen in place. Chronic changes as described above. Electronically Signed   By: Inez Catalina M.D.   On: 02/22/2019 00:50   Dg Chest 2 View  Result Date: 02/21/2019 CLINICAL DATA:  Leukocytosis and hypertension EXAM: CHEST - 2 VIEW COMPARISON:  11/12/2018 FINDINGS: Calcified pleural plaques are noted bilaterally. Right jugular Port-A-Cath is stable with its tip at the lower SVC. Normal heart size. No pneumothorax or pleural effusion. No new consolidation or lung mass. IMPRESSION: No active cardiopulmonary disease. Electronically Signed   By: Marybelle Killings M.D.   On: 02/21/2019 15:51     ASSESSMENT AND PLAN:   ** Acute severe leukocytosis Treated with augmentin x several weeks and then levaquin x 2 weeks for presumed UTI Suspected due to failed outpatient  complicated UTI given chronic bladder cancer with bilateral ureteral stents with chronic indwelling Foley Continue empiric Zosyn.  Blood cultures remain negative presently.  White cell count is trending down.  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 Increased mottled density in the bladder is noted consistent with the underlying neoplasm. The possibility of some underlying thrombus deserves consideration as well. Correlation with urine is recommended. Foley catheter is seen in place - Appreciate urology input and as per interventional radiology plan for bilateral nephrostomy tubes today with removal of the ureteral stents and replacement of Foley catheter. -Continue further care as per urology.  ** Acute renal failure -Secondary to bilateral hydronephrosis and obstruction.  Creatinine is trending up today.  Suspect this is postobstructive.  Continue IV fluids, plan for bilateral ostomy tubes today.  Will follow BUN/creatinine urine output.  Renal dose meds, avoid nephrotoxins.  ** Recent DVT ofrightleg Diagnosed 01/04/19 Eliquis on hold due to patient having procedure today.  Will resume postoperatively.  ** hx of Urinary Incontinence - cont. Myrbetriq.    Will get PT eval.    All the records are reviewed and case discussed with Care Management/Social Worker. Management plans discussed with the patient, family and they are in agreement.  CODE STATUS: Full code  DVT Prophylaxis: Ted's & SCD's.   TOTAL TIME TAKING CARE OF THIS PATIENT: 30 minutes.   POSSIBLE D/C IN 2-3 DAYS, DEPENDING ON CLINICAL CONDITION.   Henreitta Leber M.D on 02/23/2019 at  12:51 PM  Between 7am to 6pm - Pager - [phone]  After 6pm go to [email or web address] - Proofreader   [facility]  Office  [phone]  CC: Primary care physician; [name], MD

## 2019-02-23 NOTE — Procedures (Signed)
Interventional Radiology Procedure Note  Procedure: Placement of bilateral 52F percutaneous nephrostomy tubes.  Purulent urine on the LEFT.   Complications: None  Estimated Blood Loss: None  Recommendations: - Tubes to gravity - Continue abx - Trend Cr - Resume Eliquuis tomorrow  Signed,  Criselda Peaches, MD

## 2019-02-23 NOTE — Consult Note (Signed)
Pharmacy Antibiotic Note  Vincent Poole is a 84 y.o. male admitted on 02/21/2019 with worsening leukocytosis and urine as a possible source.  Pharmacy has been consulted for Zosyn dosing, which infectious disease is recommending. SCr is elevated above his baseline level of approximately 1.1 mg/dL  4/18 SCr continues to trend upward  Plan: Will decrease to Zosyn 3.375g IV q12h (4 hr infusion)  Height: 5\' 11"  (180.3 cm) Weight: 174 lb 4.8 oz (79.1 kg) IBW/kg (Calculated) : 75.3  Temp (24hrs), Avg:98.4 F (36.9 C), Min:97.9 F (36.6 C), Max:98.6 F (37 C)  Recent Labs  Lab 02/20/19 1404 02/21/19 1517 02/22/19 0343 02/23/19 0619  WBC 45.7* 36.5* 34.7* 36.3*  CREATININE  --  2.62* 2.86* 3.46*  LATICACIDVEN  --  1.8  --   --     Estimated Creatinine Clearance: 17.8 mL/min (A) (by C-G formula based on SCr of 3.46 mg/dL (H)).    No Known Allergies  Antimicrobials this admission: Zosyn 4/16 >>   Microbiology results: 4/16 BCx: pending 4/16 UCx: pending   Thank you for allowing pharmacy to be a part of this patient's care.  Paulina Fusi, PharmD, BCPS 02/23/2019 8:38 AM

## 2019-02-23 NOTE — Progress Notes (Signed)
Heparin stopped per order. Dorna Bloom RN

## 2019-02-23 NOTE — Progress Notes (Addendum)
OT Cancellation Note  Patient Details Name: Vincent Poole MRN: [id number] DOB: 06-06-1935   Cancelled Treatment:    Reason Eval/Treat Not Completed: Patient at procedure or test/ unavailable(Pt. having a stent procedure.)Will continue to monitor and treatment when appropriate, and available.  Harrel Carina , MS, OTR/L 02/23/2019, 12:48 PM

## 2019-02-23 NOTE — Consult Note (Signed)
ANTICOAGULATION CONSULT NOTE - Initial Consult  Pharmacy Consult for heparin drip management Indication: VTE prophylaxis  No Known Allergies  Patient Measurements: Height: 5\' 11"  (180.3 cm) Weight: 174 lb 4.8 oz (79.1 kg) IBW/kg (Calculated) : 75.3  Vital Signs: Temp: 98.6 F (37 C) (04/18 0441) Temp Source: Oral (04/18 0441) BP: 130/66 (04/18 0441) Pulse Rate: 99 (04/18 0441)  Labs: Recent Labs    02/21/19 1517  02/21/19 1619 02/22/19 0343 02/22/19 0753 02/22/19 2109 02/23/19 0619  HGB 10.3*  --   --  9.5*  --   --  9.2*  HCT 32.7*  --   --  29.6*  --   --  29.1*  PLT 335  --   --  319  --   --  253  APTT  --    < > 51*  --  45* 52* 43*  LABPROT  --   --  19.2*  --   --   --  16.0*  INR  --   --  1.6*  --   --   --  1.3*  HEPARINUNFRC  --   --  >3.60*  --  1.81*  --   --   CREATININE 2.62*  --   --  2.86*  --   --   --    < > = values in this interval not displayed.    Estimated Creatinine Clearance: 21.6 mL/min (A) (by C-G formula based on SCr of 2.86 mg/dL (H)).   Medical History: Past Medical History:  Diagnosis Date  . Anxiety   . Arthritis   . Bladder cancer ([place])   . Dysrhythmia 07/2018   history of atrial flutter that worsens with anxiety  . Femur fracture, right ([place]) 05/01/2018  . GERD (gastroesophageal reflux disease)   . History of recent blood transfusion 05/2018  . Hypertension   . Iron deficiency anemia 09/14/2018  . Prostate cancer ([place]) 07/2018   cancer growing in prostate but not prostate cancer, it is from the bladder  . Umbilical hernia 85/6314  . Urinary retention 2019   foley catheter place 11/2017  . UTI (urinary tract infection) 2019   frequent UTI's over last year  . Wound eschar of foot 07/2018   left heal getting wrapped and requiring antibiotic cream. cracks open with weight bearing.    Assessment: 84 year-old male who has been taking apixaban for a DVT that was diagnosed 01/04/19. He has a h/o Hhigh-grade urothelial  carcinoma with bilateral indwelling stents and chronic indwelling foley due to chronic bilateral hydronephrosis. IR to place bilateral nephrostomy tubes, plan to remove bilateral stents and foley after placement of nephrostomy tubes, possibly on 4/17 but she will be evaluated in the morning. Baseline Hgb 11.6, PLT 461, other baseline labs have been ordered but not yet resulted. He reports his last dose of apixaban today at approximately noon.  4/18 heparin infusion started @ 1100 units/hr 4/17 @ 2109 aPTT 52- infusion increased to 1200 units/hr   Goal of Therapy:  Heparin level 0.3-0.7 units/ml aPTT 66-102s Monitor platelets by anticoagulation protocol: Yes   Plan:  4/18 @ 0619 aPTT 43. Level is subtherapeutic. Heparin infusion stopped @ 0600 for planned procedure today. Will F/U with heparin plan this afternoon. Will plan to increase heparin infusion rate to 1350 units/hr when/if restarted.     Pernell Dupre, PharmD, BCPS Clinical Pharmacist 02/23/2019 6:58 AM

## 2019-02-24 LAB — BASIC METABOLIC PANEL
Anion gap: 8 (ref 5–15)
BUN: 40 mg/dL — ABNORMAL HIGH (ref 8–23)
CO2: 20 mmol/L — ABNORMAL LOW (ref 22–32)
Calcium: 8.4 mg/dL — ABNORMAL LOW (ref 8.9–10.3)
Chloride: 111 mmol/L (ref 98–111)
Creatinine, Ser: 4.01 mg/dL — ABNORMAL HIGH (ref 0.61–1.24)
GFR calc Af Amer: 15 mL/min — ABNORMAL LOW (ref >60)
GFR calc non Af Amer: 13 mL/min — ABNORMAL LOW (ref >60)
Glucose, Bld: 109 mg/dL — ABNORMAL HIGH (ref 70–99)
Potassium: 4.2 mmol/L (ref 3.5–5.1)
Sodium: 139 mmol/L (ref 135–145)

## 2019-02-24 LAB — CBC
HCT: 26.7 % — ABNORMAL LOW (ref 39.0–52.0)
Hemoglobin: 8.4 g/dL — ABNORMAL LOW (ref 13.0–17.0)
MCH: 30.8 pg (ref 26.0–34.0)
MCHC: 31.5 g/dL (ref 30.0–36.0)
MCV: 97.8 fL (ref 80.0–100.0)
Platelets: 270 10*3/uL (ref 150–400)
RBC: 2.73 MIL/uL — ABNORMAL LOW (ref 4.22–5.81)
RDW: 14.8 % (ref 11.5–15.5)
WBC: 31.9 10*3/uL — ABNORMAL HIGH (ref 4.0–10.5)
nRBC: 0 % (ref 0.0–0.2)

## 2019-02-24 MED ORDER — APIXABAN 5 MG PO TABS
5.0000 mg | ORAL_TABLET | Freq: Two times a day (BID) | ORAL | Status: DC
  Administered 2019-02-24 – 2019-02-26 (×4): 5 mg via ORAL
  Filled 2019-02-24 (×4): qty 1

## 2019-02-24 NOTE — Progress Notes (Signed)
[facility] [place] NAME: Vincent Poole    MR#:  [id number]  DATE OF BIRTH:  [birth year]  SUBJECTIVE:   This post bilateral nephrostomy tube placements yesterday.  No other acute events overnight.  Patient complaining of some nausea this morning.  REVIEW OF SYSTEMS:    Review of Systems  Constitutional: Negative for chills and fever.  HENT: Negative for congestion and tinnitus.   Eyes: Negative for blurred vision and double vision.  Respiratory: Negative for cough, shortness of breath and wheezing.   Cardiovascular: Negative for chest pain, orthopnea and PND.  Gastrointestinal: Positive for nausea. Negative for abdominal pain, diarrhea and vomiting.  Genitourinary: Negative for dysuria and hematuria.  Neurological: Negative for dizziness, sensory change and focal weakness.  All other systems reviewed and are negative.   Nutrition: Regular diet.  Tolerating Diet: Yes Tolerating PT: Await Eval.    DRUG ALLERGIES:  No Known Allergies  VITALS:  Blood pressure (!) 95/55, pulse 87, temperature 98.3 F (36.8 C), temperature source Axillary, resp. rate 18, height 5\' 11"  (1.803 m), weight 79.1 kg, SpO2 98 %.  PHYSICAL EXAMINATION:   Physical Exam  GENERAL:  84 y.o.-year-old patient lying in bed in no acute distress.  EYES: Pupils equal, round, reactive to light and accommodation. No scleral icterus. Extraocular muscles intact.  HEENT: Head atraumatic, normocephalic. Oropharynx and nasopharynx clear.  NECK:  Supple, no jugular venous distention. No thyroid enlargement, no tenderness.  LUNGS: Normal breath sounds bilaterally, no wheezing, rales, rhonchi. No use of accessory muscles of respiration.  CARDIOVASCULAR: S1, S2 normal. No murmurs, rubs, or gallops.  ABDOMEN: Soft, nontender, nondistended. Bowel sounds present. No organomegaly or mass.  EXTREMITIES: No cyanosis, clubbing or edema b/l.    NEUROLOGIC: Cranial nerves II through  XII are intact. No focal Motor or sensory deficits b/l.  Globally weak.  PSYCHIATRIC: The patient is alert and oriented x 3.  SKIN: No obvious rash, lesion, or ulcer.   BiLateral nephrostomy tubes in place with yellow urine draining.  Foley catheter in place with yellow urine draining.   LABORATORY PANEL:   CBC Recent Labs  Lab 02/24/19 0719  WBC 31.9*  HGB 8.4*  HCT 26.7*  PLT 270   ------------------------------------------------------------------------------------------------------------------  Chemistries  Recent Labs  Lab 02/21/19 1517  02/24/19 0719  NA 135   < > 139  K 4.8   < > 4.2  CL 105   < > 111  CO2 19*   < > 20*  GLUCOSE 116*   < > 109*  BUN 37*   < > 40*  CREATININE 2.62*   < > 4.01*  CALCIUM 8.8*   < > 8.4*  AST 13*  --   --   ALT 8  --   --   ALKPHOS 91  --   --   BILITOT 0.5  --   --    < > = values in this interval not displayed.   ------------------------------------------------------------------------------------------------------------------  Cardiac Enzymes No results for input(s): TROPONINI in the last 168 hours. ------------------------------------------------------------------------------------------------------------------  RADIOLOGY:  [place] Intraoperative  Result Date: 02/23/2019 INDICATION: 84 year old male with a history of locally advanced bladder cancer and persistent hydroureteronephrosis and obstructed uropathy despite the presence of bilateral double-J ureteral stents. He presents with sepsis likely due to obstructed pyonephrosis. Bilateral percutaneous nephrostomy tubes are warranted. EXAM: IR NEPHROSTOMY PLACEMENT LEFT; ULTRASOUND INTRAOPERATIVE-NRPT [facility]; IR NEPHROSTOMY PLACEMENT RIGHT COMPARISON:  None. MEDICATIONS: Patient is an inpatient receiving  Zosyn every 8 hours. No additional antibiotic prophylaxis was administered. ANESTHESIA/SEDATION: Fentanyl 75 mcg IV; Versed 1 mg IV Moderate Sedation Time:  19 minutes The patient was  continuously monitored during the procedure by the interventional radiology nurse under my direct supervision. CONTRAST:  15 mL Omnipaque 300-administered into the collecting system(s) FLUOROSCOPY TIME:  Fluoroscopy Time: 1 minutes 24 seconds (23.1 mGy). COMPLICATIONS: None immediate. TECHNIQUE: The procedure, risks, benefits, and alternatives were explained to the patient. Questions regarding the procedure were encouraged and answered. The patient understands and consents to the procedure. The left flank was prepped with chlorhexidine in a sterile fashion, and a sterile drape was applied covering the operative field. A sterile gown and sterile gloves were used for the procedure. Local anesthesia was provided with 1% Lidocaine. The left flank was interrogated with ultrasound and the left kidney identified. The kidney is hydronephrotic. A suitable access site on the skin overlying the lower pole, posterior calix was identified. After local mg anesthesia was achieved, a small skin nick was made with an 11 blade scalpel. A 21 gauge Accustick needle was then advanced under direct sonographic guidance into the lower pole of the left kidney. A 0.018 inch wire was advanced under fluoroscopic guidance into the left renal collecting system. The Accustick sheath was then advanced over the wire and a 0.018 system exchanged for a 0.035 system. Gentle hand injection of contrast material confirms placement of the sheath within the renal collecting system. There is purulent urine. Moderate hydronephrosis is present. The tract from the scan into the renal collecting system was then dilated serially to 10-French. A 10-French Cook all-purpose drain was then placed and positioned under fluoroscopic guidance. The locking loop is well formed within the left renal pelvis. The catheter was secured to the skin with 2-0 Prolene and a sterile bandage was placed. Catheter was left to gravity bag drainage. The right flank was prepped with  chlorhexidine in a sterile fashion, and a sterile drape was applied covering the operative field. A sterile gown and sterile gloves were used for the procedure. Local anesthesia was provided with 1% Lidocaine. The right flank was interrogated with ultrasound and the left kidney identified. The kidney is hydronephrotic. A suitable access site on the skin overlying the lower pole, posterior calix was identified. After local mg anesthesia was achieved, a small skin nick was made with an 11 blade scalpel. A 21 gauge Accustick needle was then advanced under direct sonographic guidance into the lower pole of the right kidney. A 0.018 inch wire was advanced under fluoroscopic guidance into the left renal collecting system. The Accustick sheath was then advanced over the wire and a 0.018 system exchanged for a 0.035 system. Gentle hand injection of contrast material confirms placement of the sheath within the renal collecting system. There is moderate hydronephrosis. The tract from the scan into the renal collecting system was then dilated serially to 10-French. A 10-French Cook all-purpose drain was then placed and positioned under fluoroscopic guidance. The locking loop is well formed within the left renal pelvis. The catheter was secured to the skin with 2-0 Prolene and a sterile bandage was placed. Catheter was left to gravity bag drainage. IMPRESSION: Successful placement of a bilateral 10 French percutaneous nephrostomy tubes. Of note, there was purulence within the urine at the time of puncture on the left suggesting that the left kidney is the source of infection. No definite purulence appreciated in the right renal collecting system. Signed, Criselda Peaches, MD, [facility] Radiology Electronically Signed   By: Jacqulynn Cadet M.D.   On: 02/23/2019 13:30   Ir Nephrostomy Placement Left  Result Date: 02/23/2019 INDICATION: 84 year old male with a  history of locally advanced bladder cancer and persistent hydroureteronephrosis and obstructed uropathy despite the presence of bilateral double-J ureteral stents. He presents with sepsis likely due to obstructed pyonephrosis. Bilateral percutaneous nephrostomy tubes are warranted. EXAM: IR NEPHROSTOMY PLACEMENT LEFT; ULTRASOUND INTRAOPERATIVE-[facility]PT [facility]; IR NEPHROSTOMY PLACEMENT RIGHT COMPARISON:  None. MEDICATIONS: Patient is an inpatient receiving Zosyn every 8 hours. No additional antibiotic prophylaxis was administered. ANESTHESIA/SEDATION: Fentanyl 75 mcg IV; Versed 1 mg IV Moderate Sedation Time:  19 minutes The patient was continuously monitored during the procedure by the interventional radiology nurse under my direct supervision. CONTRAST:  15 mL Omnipaque 300-administered into the collecting system(s) FLUOROSCOPY TIME:  Fluoroscopy Time: 1 minutes 24 seconds (23.1 mGy). COMPLICATIONS: None immediate. TECHNIQUE: The procedure, risks, benefits, and alternatives were explained to the patient. Questions regarding the procedure were encouraged and answered. The patient understands and consents to the procedure. The left flank was prepped with chlorhexidine in a sterile fashion, and a sterile drape was applied covering the operative field. A sterile gown and sterile gloves were used for the procedure. Local anesthesia was provided with 1% Lidocaine. The left flank was interrogated with ultrasound and the left kidney identified. The kidney is hydronephrotic. A suitable access site on the skin overlying the lower pole, posterior calix was identified. After local mg anesthesia was achieved, a small skin nick was made with an 11 blade scalpel. A 21 gauge Accustick needle was then advanced under direct sonographic guidance into the lower pole of the left kidney. A 0.018 inch wire was advanced under fluoroscopic guidance into the left renal collecting system. The Accustick sheath was then advanced over the wire and a  0.018 system exchanged for a 0.035 system. Gentle hand injection of contrast material confirms placement of the sheath within the renal collecting system. There is purulent urine. Moderate hydronephrosis is present. The tract from the scan into the renal collecting system was then dilated serially to 10-French. A 10-French Cook all-purpose drain was then placed and positioned under fluoroscopic guidance. The locking loop is well formed within the left renal pelvis. The catheter was secured to the skin with 2-0 Prolene and a sterile bandage was placed. Catheter was left to gravity bag drainage. The right flank was prepped with chlorhexidine in a sterile fashion, and a sterile drape was applied covering the operative field. A sterile gown and sterile gloves were used for the procedure. Local anesthesia was provided with 1% Lidocaine. The right flank was interrogated with ultrasound and the left kidney identified. The kidney is hydronephrotic. A suitable access site on the skin overlying the lower pole, posterior calix was identified. After local mg anesthesia was achieved, a small skin nick was made with an 11 blade scalpel. A 21 gauge Accustick needle was then advanced under direct sonographic guidance into the lower pole of the right kidney. A 0.018 inch wire was advanced under fluoroscopic guidance into the left renal collecting system. The Accustick sheath was then advanced over the wire and a 0.018 system exchanged for a 0.035 system. Gentle hand injection of contrast material confirms placement of the sheath within the renal collecting system. There is moderate hydronephrosis. The tract from the scan into the renal collecting system was then dilated serially to 10-French. A 10-French Cook all-purpose drain was then placed and positioned  under fluoroscopic guidance. The locking loop is well formed within the left renal pelvis. The catheter was secured to the skin with 2-0 Prolene and a sterile bandage was placed.  Catheter was left to gravity bag drainage. IMPRESSION: Successful placement of a bilateral 10 French percutaneous nephrostomy tubes. Of note, there was purulence within the urine at the time of puncture on the left suggesting that the left kidney is the source of infection. No definite purulence appreciated in the right renal collecting system. Signed, Criselda Peaches, MD, [facility] [facility] Radiology Electronically Signed   By: Jacqulynn Cadet M.D.   On: 02/23/2019 13:30   Ir Nephrostomy Placement Right  Result Date: 02/23/2019 INDICATION: 84 year old male with a history of locally advanced bladder cancer and persistent hydroureteronephrosis and obstructed uropathy despite the presence of bilateral double-J ureteral stents. He presents with sepsis likely due to obstructed pyonephrosis. Bilateral percutaneous nephrostomy tubes are warranted. EXAM: IR NEPHROSTOMY PLACEMENT LEFT; ULTRASOUND INTRAOPERATIVE-[facility]PT [facility]; IR NEPHROSTOMY PLACEMENT RIGHT COMPARISON:  None. MEDICATIONS: Patient is an inpatient receiving Zosyn every 8 hours. No additional antibiotic prophylaxis was administered. ANESTHESIA/SEDATION: Fentanyl 75 mcg IV; Versed 1 mg IV Moderate Sedation Time:  19 minutes The patient was continuously monitored during the procedure by the interventional radiology nurse under my direct supervision. CONTRAST:  15 mL Omnipaque 300-administered into the collecting system(s) FLUOROSCOPY TIME:  Fluoroscopy Time: 1 minutes 24 seconds (23.1 mGy). COMPLICATIONS: None immediate. TECHNIQUE: The procedure, risks, benefits, and alternatives were explained to the patient. Questions regarding the procedure were encouraged and answered. The patient understands and consents to the procedure. The left flank was prepped with chlorhexidine in a sterile fashion, and a sterile drape was applied covering the operative field. A sterile gown and sterile gloves were used for the  procedure. Local anesthesia was provided with 1% Lidocaine. The left flank was interrogated with ultrasound and the left kidney identified. The kidney is hydronephrotic. A suitable access site on the skin overlying the lower pole, posterior calix was identified. After local mg anesthesia was achieved, a small skin nick was made with an 11 blade scalpel. A 21 gauge Accustick needle was then advanced under direct sonographic guidance into the lower pole of the left kidney. A 0.018 inch wire was advanced under fluoroscopic guidance into the left renal collecting system. The Accustick sheath was then advanced over the wire and a 0.018 system exchanged for a 0.035 system. Gentle hand injection of contrast material confirms placement of the sheath within the renal collecting system. There is purulent urine. Moderate hydronephrosis is present. The tract from the scan into the renal collecting system was then dilated serially to 10-French. A 10-French Cook all-purpose drain was then placed and positioned under fluoroscopic guidance. The locking loop is well formed within the left renal pelvis. The catheter was secured to the skin with 2-0 Prolene and a sterile bandage was placed. Catheter was left to gravity bag drainage. The right flank was prepped with chlorhexidine in a sterile fashion, and a sterile drape was applied covering the operative field. A sterile gown and sterile gloves were used for the procedure. Local anesthesia was provided with 1% Lidocaine. The right flank was interrogated with ultrasound and the left kidney identified. The kidney is hydronephrotic. A suitable access site on the skin overlying the lower pole, posterior calix was identified. After local mg anesthesia was achieved, a small skin nick was made with an 11 blade scalpel. A 21 gauge Accustick needle was then advanced  under direct sonographic guidance into the lower pole of the right kidney. A 0.018 inch wire was advanced under fluoroscopic  guidance into the left renal collecting system. The Accustick sheath was then advanced over the wire and a 0.018 system exchanged for a 0.035 system. Gentle hand injection of contrast material confirms placement of the sheath within the renal collecting system. There is moderate hydronephrosis. The tract from the scan into the renal collecting system was then dilated serially to 10-French. A 10-French Cook all-purpose drain was then placed and positioned under fluoroscopic guidance. The locking loop is well formed within the left renal pelvis. The catheter was secured to the skin with 2-0 Prolene and a sterile bandage was placed. Catheter was left to gravity bag drainage. IMPRESSION: Successful placement of a bilateral 10 French percutaneous nephrostomy tubes. Of note, there was purulence within the urine at the time of puncture on the left suggesting that the left kidney is the source of infection. No definite purulence appreciated in the right renal collecting system. Signed, Criselda Peaches, MD, [place] [facility] Radiology Electronically Signed   By: Jacqulynn Cadet M.D.   On: 02/23/2019 13:30     ASSESSMENT AND PLAN:   ** Acute severe leukocytosis Treated with augmentin x several weeks and then levaquin x 2 weeks for presumed UTI Suspected due to failed outpatient complicated UTI given chronic bladder cancer with bilateral ureteral stents with chronic indwelling Foley Continue empiric Zosyn.  Blood cultures remain negative presently.  White cell count is trending down.  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 Increased mottled density in the bladder is noted consistent with the underlying neoplasm. The possibility of  some underlying thrombus deserves consideration as well. Correlation with urine is recommended. Foley catheter is seen in place - Appreciate urology input and as per interventional radiology s/p bilateral nephrostomy tubes POD # 1.  -We will need to discuss with urology regarding plan for removing ureteral stents and Foley catheter.  ** Acute renal failure -Secondary to bilateral hydronephrosis and obstruction.  Cr. Continues to trend up.  - Suspect this is postobstructive.  Continue IV fluids, Renal dose meds and avoid nephrotoxins.   ** Recent DVT ofrightleg - will resume Eliquis today post-operatively.   ** hx of Urinary Incontinence - cont. Myrbetriq.    Will get PT eval.    All the records are reviewed and case discussed with Care Management/Social Worker. Management plans discussed with the patient, family and they are in agreement.  CODE STATUS: Full code  DVT Prophylaxis: Ted's & SCD's.   TOTAL TIME TAKING CARE OF THIS PATIENT: 30 minutes.   POSSIBLE D/C IN 2-3 DAYS, DEPENDING ON CLINICAL CONDITION.   Henreitta Leber M.D on 02/24/2019 at 1:03 PM  Between 7am to 6pm - Pager - [phone]  After 6pm go to [email or web address] - Proofreader  [facility]  Office  [phone]  CC: Primary care physician; [name], MD

## 2019-02-24 NOTE — Progress Notes (Signed)
Dressing changed to bilateral nephrostomy tubes with split gauze and tegaderm.  Insertion site is cdi both sides,  Stat locks underneath the dressing to stabilize tubes. Dorna Bloom RN

## 2019-02-24 NOTE — Progress Notes (Signed)
Verified with pt's wife, Vincent Poole via telephone call that his last bowel movement was Thursday, 02/21/2019; his usual bowel regimen is every 4-5 days; his appetite has been poor at home however, he will drink a chocolate nutritional supplement occasionally.

## 2019-02-25 ENCOUNTER — Ambulatory Visit: Payer: Medicare Other

## 2019-02-25 ENCOUNTER — Ambulatory Visit: Payer: Medicare Other | Admitting: Oncology

## 2019-02-25 ENCOUNTER — Telehealth: Payer: Self-pay | Admitting: Urology

## 2019-02-25 ENCOUNTER — Inpatient Hospital Stay: Payer: Medicare Other

## 2019-02-25 DIAGNOSIS — N1330 Unspecified hydronephrosis: Secondary | ICD-10-CM

## 2019-02-25 LAB — BASIC METABOLIC PANEL
Anion gap: 9 (ref 5–15)
BUN: 34 mg/dL — ABNORMAL HIGH (ref 8–23)
CO2: 20 mmol/L — ABNORMAL LOW (ref 22–32)
Calcium: 8.1 mg/dL — ABNORMAL LOW (ref 8.9–10.3)
Chloride: 112 mmol/L — ABNORMAL HIGH (ref 98–111)
Creatinine, Ser: 3.1 mg/dL — ABNORMAL HIGH (ref 0.61–1.24)
GFR calc Af Amer: 21 mL/min — ABNORMAL LOW (ref >60)
GFR calc non Af Amer: 18 mL/min — ABNORMAL LOW (ref >60)
Glucose, Bld: 102 mg/dL — ABNORMAL HIGH (ref 70–99)
Potassium: 3.7 mmol/L (ref 3.5–5.1)
Sodium: 141 mmol/L (ref 135–145)

## 2019-02-25 LAB — CBC
HCT: 26.5 % — ABNORMAL LOW (ref 39.0–52.0)
Hemoglobin: 8.6 g/dL — ABNORMAL LOW (ref 13.0–17.0)
MCH: 30.7 pg (ref 26.0–34.0)
MCHC: 32.5 g/dL (ref 30.0–36.0)
MCV: 94.6 fL (ref 80.0–100.0)
Platelets: 270 10*3/uL (ref 150–400)
RBC: 2.8 MIL/uL — ABNORMAL LOW (ref 4.22–5.81)
RDW: 14.7 % (ref 11.5–15.5)
WBC: 29.6 10*3/uL — ABNORMAL HIGH (ref 4.0–10.5)
nRBC: 0 % (ref 0.0–0.2)

## 2019-02-25 LAB — PATHOLOGIST SMEAR REVIEW

## 2019-02-25 MED ORDER — SODIUM CHLORIDE 0.9 % IV SOLN
1.5000 g | Freq: Two times a day (BID) | INTRAVENOUS | Status: DC
  Administered 2019-02-25 – 2019-02-26 (×2): 1.5 g via INTRAVENOUS
  Filled 2019-02-25 (×4): qty 1.5

## 2019-02-25 NOTE — TOC Progression Note (Signed)
Transition of Care [facility]) - Progression Note    Patient Details  Name: Vincent Poole MRN: [id number] Date of Birth: Mar 08, 1935  [facility] of [facility] CM/SW [place], RN Phone Number: 02/25/2019, 4:19 PM  Clinical Narrative:     Patient agrees to do a bed search for SNF placement, attempted to reach his wife ruth to discuss without success  Expected Discharge Plan: [place] Barriers to Discharge: Continued Medical Work up  Expected Discharge Plan and Services Expected Discharge Plan: [place]   Discharge Planning Services: CM Consult   Living arrangements for the past 2 months: Single Family Home                 DME Arranged: (states that he has no needs)         Social Determinants of Health (SDOH) Interventions    Readmission Risk Interventions Readmission Risk Prevention Plan 02/22/2019  Transportation Screening Complete  HRI or Home Care Consult Complete  Medication Review (RN Care Manager) Complete  Some recent data might be hidden

## 2019-02-25 NOTE — Evaluation (Signed)
Occupational Therapy Evaluation Patient Details Name: Vincent Poole MRN: [id number] DOB: [birth year] Today's Date: 02/25/2019    History of Present Illness Vincent Poole  is a 84 y.o. male with a known history of bladder cancer with bilateral indwelling stents and chronic indwelling foley  who was directly admitted to hospital on 02/21/2019 from the urology office for worsening leucocytosis in the setting of worsening b/l hydronephrosis, ureteral stents and foley catheter. He as been recently treated for enterococcus fecalis in the urine by his oncologist first with Augmentin and then levaquin. Relevant PMH includes DVT diagnosed 01/04/2019,  GERD, anxiety, arthrits, history of atrial flutter, R femur fracture, HTN, prostate cancer, umbilical hernia, wound eschar L of foot, urinary retention with urinary catheter, multiple bladder surgeries/procedures.    Clinical Impression   Pt seen for OT evaluation this date. Prior to hospital admission, pt required assistance for ADLs including dressing and bathing, and was dependent for IADL with family members assisting him with cooking, cleaning, etc.  Pt lives with his wife, son, and DIL in a one-level mobile home with 4-5 steps to enter and L hand rail. Pt was intermittently tearful t/o OT evaluation, and expressed dissatisfaction with his current level of independence. Currently, pt demonstrates impairments in strength, coordination, activity tolerance, and functional UE use; requires Moderate assist for LB ADL and +2 min assist for mobility (chair follow for safety).  Pt would benefit from skilled OT to address noted impairments and functional limitations (see below for any additional details) in order to maximize safety and independence while minimizing falls risk and caregiver burden.  Upon hospital discharge, recommend pt discharge to [facility]    Follow Up Recommendations  SNF    Equipment Recommendations  (TBD)    Recommendations for Other Services        Precautions / Restrictions Precautions Precautions: Fall Restrictions Weight Bearing Restrictions: No      Mobility Bed Mobility               General bed mobility comments: Deferred. Pt declined EOB/OOB activity on this date d/t pain and fatigue. Per PT pt able to cmoplete sup>sit with min A for mgt of LEs off bed.   Transfers                 General transfer comment: Per PT note: pt required min a STS from elevated bed. Had +2 for chair follow.     Balance Overall balance assessment: Needs assistance Sitting-balance support: Bilateral upper extremity supported;Feet supported Sitting balance-Leahy Scale: Fair Sitting balance - Comments: Per PT note: pt required BUE support to sit EOB.                                    ADL either performed or assessed with clinical judgement   ADL Overall ADL's : Needs assistance/impaired Eating/Feeding: Set up;Sitting   Grooming: Set up;Sitting;Minimal assistance Grooming Details (indicate cue type and reason): Wears dentures, but left them at home.  Upper Body Bathing: Sitting;Set up;Moderate assistance   Lower Body Bathing: Set up;Maximal assistance;Sitting/lateral leans   Upper Body Dressing : Set up;Moderate assistance;Sitting   Lower Body Dressing: Set up;Moderate assistance;+2 for physical assistance;Sit to/from stand   Toilet Transfer: Set up;BSC;+2 for physical assistance;RW   Toileting- Clothing Manipulation and Hygiene: Set up;Moderate assistance;Sit to/from stand;+2 for physical assistance       Functional mobility during ADLs: +2 for safety/equipment;+2 for physical assistance;Rolling  walker General ADL Comments: Pt requires ADL assist at baseline. Currently does not use AE. would benefit from further AE education/demonstration for improved functional independence. Pt very tearful at time of evolution about things he is unable to do. Expressed desire to be more independent in self-care  tasks.     Vision         Perception     Praxis      Pertinent Vitals/Pain Pain Score: 8  Pain Location: Abdominal/pelvic pain. Pt endorses from recent proceedure.  Pain Intervention(s): Limited activity within patient's tolerance;Monitored during session;Patient requesting pain meds-RN notified;RN gave pain meds during session;Utilized relaxation techniques     Hand Dominance Right   Extremity/Trunk Assessment Upper Extremity Assessment Upper Extremity Assessment: Generalized weakness   Lower Extremity Assessment Lower Extremity Assessment: Generalized weakness       Communication Communication Communication: HOH   Cognition Arousal/Alertness: Awake/alert Behavior During Therapy: WFL for tasks assessed/performed;Anxious(tearful at times) Overall Cognitive Status: No family/caregiver present to determine baseline cognitive functioning                                 General Comments: Pt became tearful multiple times t/o evaluation, particularly when speaking about wife. Endorsed feeling distressed over current health status and stated he becomes upset when he thinks about what he "used to be able to do".    General Comments  Pt declined OOB/EOB activity on this date. Stated he was in pain from "the doctor pulling that thing out of me" (usure what he was referring to, no MD note in system at time of eval to indicate proceedure this AM). OT limited activity within pt tolerance and RN gave medication during session.     Exercises Other Exercises Other Exercises: Pt tearful about PLOF and health status. OT utilized therapeutic use of self and active listening for emotional support and encouragement at time of evaluation. Other Exercises: Pt educated (briefly) on options of AE for self care. Pt interested but declined to demo on this date. Expressed desire to be more independent.    Shoulder Instructions      Home Living Family/patient expects to be discharged  to:: Private residence Living Arrangements: Spouse/significant other;Children;Other relatives Available Help at Discharge: Family;Available 24 hours/day Type of Home: Mobile home Home Access: Stairs to enter   Entrance Stairs-Rails: Left Home Layout: One level     Bathroom Shower/Tub: Curtain;Tub/shower unit   Biochemist, clinical: Standard     Home Equipment: Environmental consultant - 2 wheels          Prior Functioning/Environment Level of Independence: Needs assistance  Gait / Transfers Assistance Needed: transfers with assist from family to RW. Ambulates with assistance with RW.  ADL's / Homemaking Assistance Needed: patient requires help with dressing, bathing at times. Requires help with IADLs.    Comments: Pt.'s wife, son, and daughter in law assists with meal preparation, medication management, and transportation. Patient sleeps in recliner.         OT Problem List: Decreased strength;Impaired balance (sitting and/or standing);Pain;Decreased range of motion;Decreased safety awareness;Decreased activity tolerance;Decreased coordination;Decreased knowledge of use of DME or AE;Impaired UE functional use      OT Treatment/Interventions: Self-care/ADL training;Therapeutic exercise;Manual therapy;Modalities;Patient/family education;Balance training;Energy conservation;Therapeutic activities;DME and/or AE instruction;Cognitive remediation/compensation    OT Goals(Current goals can be found in the care plan section) Acute Rehab OT Goals Patient Stated Goal: return home, get better OT Goal Formulation: With patient Time For Goal  Achievement: 03/11/19 Potential to Achieve Goals: Good ADL Goals Pt Will Perform Grooming: with set-up;with adaptive equipment;sitting(With LRAD for safety and improved functional independence.) Pt Will Perform Upper Body Bathing: with set-up;sitting;with adaptive equipment(With LRAD for safety and improved functional independence.) Pt Will Perform Upper Body Dressing:  with set-up;sitting;with adaptive equipment(With LRAD for safety and improved functional independence.) Pt Will Transfer to Toilet: bedside commode;ambulating;with min assist(With LRAD/DME for safety and improved functional independence.)  OT Frequency: Min 1X/week   Barriers to D/C: Inaccessible home environment  Pt with steps to enter the home. Has not yet attempted steps with PT.        Co-evaluation              AM-PAC OT "6 Clicks" Daily Activity     Outcome Measure Help from another person eating meals?: None Help from another person taking care of personal grooming?: A Little Help from another person toileting, which includes using toliet, bedpan, or urinal?: A Lot Help from another person bathing (including washing, rinsing, drying)?: A Lot Help from another person to put on and taking off regular upper body clothing?: A Little Help from another person to put on and taking off regular lower body clothing?: A Lot 6 Click Score: 16   End of Session Nurse Communication: Patient requests pain meds  Activity Tolerance: Patient limited by pain;Patient limited by fatigue Patient left: in bed;with bed alarm set;with SCD's reapplied;with call bell/phone within reach  OT Visit Diagnosis: Other abnormalities of gait and mobility (R26.89);Muscle weakness (generalized) (M62.81);Pain Pain - Right/Left: (Trunk) Pain - part of body: (trunk)                Time: 5638-9373 OT Time Calculation (min): 25 min Charges:  OT General Charges $OT Visit: 1 Visit OT Evaluation $OT Eval Moderate Complexity: 1 Mod OT Treatments $Self Care/Home Management : 8-22 mins  Shara Blazing, M.S., OTR/L Ascom: [phone] 02/25/19, 11:13 AM

## 2019-02-25 NOTE — Care Management Important Message (Signed)
Important Message  Patient Details  Name: Vincent Poole MRN: [id number] Date of Birth: 12/16/1934   Medicare Important Message Given:  Yes    Juliann Pulse A Khalon Cansler 02/25/2019, 12:00 PM

## 2019-02-25 NOTE — NC FL2 (Signed)
[place] LEVEL OF CARE SCREENING TOOL     IDENTIFICATION  Patient Name: Vincent Poole Birthdate: [birth year] Sex: male Admission Date (Current Location): 02/21/2019  [place] and [place] Number:  Engineering geologist and Address:  [facility], [address], [place] [address]      Provider Number: [id number]  Attending Physician Name and Address:  Henreitta Leber, MD  Relative Name and Phone Number:  Hatim Homann [phone]    Current Level of Care: Hospital Recommended Level of Care: [place] Prior Approval Number:    Date Approved/Denied: 05/02/18 PASRR Number: [id number]  Discharge Plan: SNF    Current Diagnoses: Patient Active Problem List   Diagnosis Date Noted  . Leukocytosis 02/21/2019  . Complicated UTI (urinary tract infection) 11/14/2018  . Palliative care encounter   . Iron deficiency anemia 09/14/2018  . Hip fracture ([place]) 05/01/2018  . Malignant neoplasm of urinary bladder ([place]) 01/12/2018  . Goals of care, counseling/discussion 01/12/2018  . Pyelonephritis 05/31/2017  . UTI (urinary tract infection) 03/04/2017  . Urinary obstruction   . Essential hypertension 08/30/2016  . History of shingles 08/30/2016  . Prostate cancer ([place]) 08/30/2016  . Urinary retention 08/30/2016  . Medicare annual wellness visit, initial 08/01/2016  . Medicare annual wellness visit, subsequent 08/01/2016  . Sepsis ([place]) 07/11/2016  . Borderline diabetes mellitus 01/26/2016  . Vaccine counseling 01/26/2015  . Lumbar radiculitis 06/24/2014  . OA (osteoarthritis) of hip 06/24/2014  . Malignant neoplasm of prostate ([place]) 01/27/2011    Orientation RESPIRATION BLADDER Height & Weight     Self, Time, Situation, Place  Normal Indwelling catheter Weight: 75.2 kg Height:  5\' 11"  (180.3 cm)  BEHAVIORAL SYMPTOMS/MOOD NEUROLOGICAL BOWEL NUTRITION STATUS      Continent Diet(renal diet)  AMBULATORY STATUS  COMMUNICATION OF NEEDS Skin   Extensive Assist Verbally Normal                       Personal Care Assistance Level of Assistance  Bathing, Dressing Bathing Assistance: Limited assistance   Dressing Assistance: Limited assistance     Functional Limitations Info  Sight, Hearing, Speech Sight Info: Adequate Hearing Info: Impaired Speech Info: Adequate    SPECIAL CARE FACTORS FREQUENCY  PT (By licensed PT)     PT Frequency: 5 times a week              Contractures Contractures Info: Not present    Additional Factors Info  Code Status, Allergies Code Status Info: full Allergies Info: no known allergies           Current Medications (02/25/2019):  This is the current hospital active medication list Current Facility-Administered Medications  Medication Dose Route Frequency Provider Last Rate Last Dose  . 0.9 %  sodium chloride infusion   Intravenous Continuous Mayo, Pete Pelt, MD 75 mL/hr at 02/24/19 1625    . 0.9 %  sodium chloride infusion   Intravenous PRN Mayo, Pete Pelt, MD   Stopped at 02/21/19 1712  . acetaminophen (TYLENOL) tablet 650 mg  650 mg Oral Q6H PRN Sela Hua, MD   650 mg at 02/24/19 1617   Or  . acetaminophen (TYLENOL) suppository 650 mg  650 mg Rectal Q6H PRN Mayo, Pete Pelt, MD      . ampicillin-sulbactam (UNASYN) 1.5 g in sodium chloride 0.9 % 100 mL IVPB  1.5 g Intravenous Q12H Tsosie Billing, MD      . apixaban Arne [place])  tablet 5 mg  5 mg Oral BID Henreitta Leber, MD   5 mg at 02/25/19 1287  . feeding supplement (ENSURE ENLIVE) (ENSURE ENLIVE) liquid 237 mL  237 mL Oral TID BM Salary, Montell D, MD   237 mL at 02/25/19 1334  . ferrous sulfate tablet 325 mg  325 mg Oral Q breakfast Mayo, Pete Pelt, MD   325 mg at 02/25/19 8676  . iohexol (OMNIPAQUE) 300 MG/ML solution 30 mL  30 mL Other Once PRN Jacqulynn Cadet, MD      . loratadine (CLARITIN) tablet 10 mg  10 mg Oral Daily Mayo, Pete Pelt, MD   10 mg at 02/25/19 7209  .  mirabegron ER (MYRBETRIQ) tablet 50 mg  50 mg Oral Daily Mayo, Pete Pelt, MD   50 mg at 02/25/19 4709  . morphine 2 MG/ML injection 1 mg  1 mg Intravenous Q4H PRN Harrie Foreman, MD   1 mg at 02/23/19 0009  . multivitamin with minerals tablet 1 tablet  1 tablet Oral Daily Mayo, Pete Pelt, MD   1 tablet at 02/25/19 [phone]  . ondansetron (ZOFRAN) tablet 4 mg  4 mg Oral Q6H PRN Mayo, Pete Pelt, MD       Or  . ondansetron [facility]) injection 4 mg  4 mg Intravenous Q6H PRN Mayo, Pete Pelt, MD   4 mg at 02/22/19 1754  . oxyCODONE (Oxy IR/ROXICODONE) immediate release tablet 5 mg  5 mg Oral Q4H PRN Mayo, Pete Pelt, MD   5 mg at 02/25/19 0926  . polyethylene glycol (MIRALAX / GLYCOLAX) packet 17 g  17 g Oral Daily PRN Mayo, Pete Pelt, MD      . vitamin B-12 (CYANOCOBALAMIN) tablet 500 mcg  500 mcg Oral Daily Mayo, Pete Pelt, MD   500 mcg at 02/25/19 6629     Discharge Medications: Please see discharge summary for a list of discharge medications.  Relevant Imaging Results:  Relevant Lab Results:   Additional Information [id number]  Su Hilt, RN

## 2019-02-25 NOTE — Telephone Encounter (Signed)
Please schedule Vincent Poole for in office cystoscopy with bilateral stent removal with Dr. Erlene Quan on Wednesday 02/27/2019.

## 2019-02-25 NOTE — Progress Notes (Signed)
ID Pt stab;e No fever Had b/l nephrostomy tubes No urine was sent from them Currently on zosyn but leucocytosis has not changed much- very likely due to tumor burden. Send urine culture from nephrostomy Change zosyn to unasyn- will not need any antibiotics on discharge unless nephrostomy urine ha a growrh

## 2019-02-25 NOTE — Progress Notes (Signed)
[facility] [place] NAME: Vincent Poole    MR#:  [id number]  DATE OF BIRTH:  [birth year]  SUBJECTIVE:   Status post bilateral nephrostomy tube placement POD # 2.  Afebrile, WBC count trending down, Cr. Trending down. Pt. Has no complaints other than some generalized weakness.   REVIEW OF SYSTEMS:    Review of Systems  Constitutional: Negative for chills and fever.  HENT: Negative for congestion and tinnitus.   Eyes: Negative for blurred vision and double vision.  Respiratory: Negative for cough, shortness of breath and wheezing.   Cardiovascular: Negative for chest pain, orthopnea and PND.  Gastrointestinal: Negative for abdominal pain, diarrhea, nausea and vomiting.  Genitourinary: Negative for dysuria and hematuria.  Neurological: Negative for dizziness, sensory change and focal weakness.  All other systems reviewed and are negative.   Nutrition: Regular diet.  Tolerating Diet: Yes Tolerating PT: Eval noted   DRUG ALLERGIES:  No Known Allergies  VITALS:  Blood pressure 109/72, pulse 88, temperature 98.4 F (36.9 C), temperature source Axillary, resp. rate 17, height 5\' 11"  (1.803 m), weight 75.2 kg, SpO2 99 %.  PHYSICAL EXAMINATION:   Physical Exam  GENERAL:  84 y.o.-year-old patient lying in bed in no acute distress.  EYES: Pupils equal, round, reactive to light and accommodation. No scleral icterus. Extraocular muscles intact.  HEENT: Head atraumatic, normocephalic. Oropharynx and nasopharynx clear.  NECK:  Supple, no jugular venous distention. No thyroid enlargement, no tenderness.  LUNGS: Normal breath sounds bilaterally, no wheezing, rales, rhonchi. No use of accessory muscles of respiration.  CARDIOVASCULAR: S1, S2 normal. No murmurs, rubs, or gallops.  ABDOMEN: Soft, nontender, nondistended. Bowel sounds present. No organomegaly or mass.  EXTREMITIES: No cyanosis, clubbing or edema b/l.    NEUROLOGIC: Cranial  nerves II through XII are intact. No focal Motor or sensory deficits b/l.  Globally weak.  PSYCHIATRIC: The patient is alert and oriented x 3.  SKIN: No obvious rash, lesion, or ulcer.   BiLateral nephrostomy tubes in place with yellow urine draining.   LABORATORY PANEL:   CBC Recent Labs  Lab 02/25/19 0355  WBC 29.6*  HGB 8.6*  HCT 26.5*  PLT 270   ------------------------------------------------------------------------------------------------------------------  Chemistries  Recent Labs  Lab 02/21/19 1517  02/25/19 0355  NA 135   < > 141  K 4.8   < > 3.7  CL 105   < > 112*  CO2 19*   < > 20*  GLUCOSE 116*   < > 102*  BUN 37*   < > 34*  CREATININE 2.62*   < > 3.10*  CALCIUM 8.8*   < > 8.1*  AST 13*  --   --   ALT 8  --   --   ALKPHOS 91  --   --   BILITOT 0.5  --   --    < > = values in this interval not displayed.   ------------------------------------------------------------------------------------------------------------------  Cardiac Enzymes No results for input(s): TROPONINI in the last 168 hours. ------------------------------------------------------------------------------------------------------------------  RADIOLOGY:  [place] Intraoperative  Result Date: 02/23/2019 INDICATION: 84 year old male with a history of locally advanced bladder cancer and persistent hydroureteronephrosis and obstructed uropathy despite the presence of bilateral double-J ureteral stents. He presents with sepsis likely due to obstructed pyonephrosis. Bilateral percutaneous nephrostomy tubes are warranted. EXAM: IR NEPHROSTOMY PLACEMENT LEFT; ULTRASOUND INTRAOPERATIVE-NRPT [facility]; IR NEPHROSTOMY PLACEMENT RIGHT COMPARISON:  None. MEDICATIONS: Patient is an inpatient receiving Zosyn every 8 hours. No additional  antibiotic prophylaxis was administered. ANESTHESIA/SEDATION: Fentanyl 75 mcg IV; Versed 1 mg IV Moderate Sedation Time:  19 minutes The patient was continuously monitored during the  procedure by the interventional radiology nurse under my direct supervision. CONTRAST:  15 mL Omnipaque 300-administered into the collecting system(s) FLUOROSCOPY TIME:  Fluoroscopy Time: 1 minutes 24 seconds (23.1 mGy). COMPLICATIONS: None immediate. TECHNIQUE: The procedure, risks, benefits, and alternatives were explained to the patient. Questions regarding the procedure were encouraged and answered. The patient understands and consents to the procedure. The left flank was prepped with chlorhexidine in a sterile fashion, and a sterile drape was applied covering the operative field. A sterile gown and sterile gloves were used for the procedure. Local anesthesia was provided with 1% Lidocaine. The left flank was interrogated with ultrasound and the left kidney identified. The kidney is hydronephrotic. A suitable access site on the skin overlying the lower pole, posterior calix was identified. After local mg anesthesia was achieved, a small skin nick was made with an 11 blade scalpel. A 21 gauge Accustick needle was then advanced under direct sonographic guidance into the lower pole of the left kidney. A 0.018 inch wire was advanced under fluoroscopic guidance into the left renal collecting system. The Accustick sheath was then advanced over the wire and a 0.018 system exchanged for a 0.035 system. Gentle hand injection of contrast material confirms placement of the sheath within the renal collecting system. There is purulent urine. Moderate hydronephrosis is present. The tract from the scan into the renal collecting system was then dilated serially to 10-French. A 10-French Cook all-purpose drain was then placed and positioned under fluoroscopic guidance. The locking loop is well formed within the left renal pelvis. The catheter was secured to the skin with 2-0 Prolene and a sterile bandage was placed. Catheter was left to gravity bag drainage. The right flank was prepped with chlorhexidine in a sterile fashion,  and a sterile drape was applied covering the operative field. A sterile gown and sterile gloves were used for the procedure. Local anesthesia was provided with 1% Lidocaine. The right flank was interrogated with ultrasound and the left kidney identified. The kidney is hydronephrotic. A suitable access site on the skin overlying the lower pole, posterior calix was identified. After local mg anesthesia was achieved, a small skin nick was made with an 11 blade scalpel. A 21 gauge Accustick needle was then advanced under direct sonographic guidance into the lower pole of the right kidney. A 0.018 inch wire was advanced under fluoroscopic guidance into the left renal collecting system. The Accustick sheath was then advanced over the wire and a 0.018 system exchanged for a 0.035 system. Gentle hand injection of contrast material confirms placement of the sheath within the renal collecting system. There is moderate hydronephrosis. The tract from the scan into the renal collecting system was then dilated serially to 10-French. A 10-French Cook all-purpose drain was then placed and positioned under fluoroscopic guidance. The locking loop is well formed within the left renal pelvis. The catheter was secured to the skin with 2-0 Prolene and a sterile bandage was placed. Catheter was left to gravity bag drainage. IMPRESSION: Successful placement of a bilateral 10 French percutaneous nephrostomy tubes. Of note, there was purulence within the urine at the time of puncture on the left suggesting that the left kidney is the source of infection. No definite purulence appreciated in the right renal collecting system. Signed, Criselda Peaches, MD, RPVI [facility] Radiology  Electronically Signed   By: Jacqulynn Cadet M.D.   On: 02/23/2019 13:30   Ir Nephrostomy Placement Right  Result Date: 02/23/2019 INDICATION: 84 year old male with a history of locally advanced bladder  cancer and persistent hydroureteronephrosis and obstructed uropathy despite the presence of bilateral double-J ureteral stents. He presents with sepsis likely due to obstructed pyonephrosis. Bilateral percutaneous nephrostomy tubes are warranted. EXAM: IR NEPHROSTOMY PLACEMENT LEFT; ULTRASOUND INTRAOPERATIVE-[facility]PT [facility]; IR NEPHROSTOMY PLACEMENT RIGHT COMPARISON:  None. MEDICATIONS: Patient is an inpatient receiving Zosyn every 8 hours. No additional antibiotic prophylaxis was administered. ANESTHESIA/SEDATION: Fentanyl 75 mcg IV; Versed 1 mg IV Moderate Sedation Time:  19 minutes The patient was continuously monitored during the procedure by the interventional radiology nurse under my direct supervision. CONTRAST:  15 mL Omnipaque 300-administered into the collecting system(s) FLUOROSCOPY TIME:  Fluoroscopy Time: 1 minutes 24 seconds (23.1 mGy). COMPLICATIONS: None immediate. TECHNIQUE: The procedure, risks, benefits, and alternatives were explained to the patient. Questions regarding the procedure were encouraged and answered. The patient understands and consents to the procedure. The left flank was prepped with chlorhexidine in a sterile fashion, and a sterile drape was applied covering the operative field. A sterile gown and sterile gloves were used for the procedure. Local anesthesia was provided with 1% Lidocaine. The left flank was interrogated with ultrasound and the left kidney identified. The kidney is hydronephrotic. A suitable access site on the skin overlying the lower pole, posterior calix was identified. After local mg anesthesia was achieved, a small skin nick was made with an 11 blade scalpel. A 21 gauge Accustick needle was then advanced under direct sonographic guidance into the lower pole of the left kidney. A 0.018 inch wire was advanced under fluoroscopic guidance into the left renal collecting system. The Accustick sheath was then advanced over the wire and a 0.018 system exchanged for a 0.035  system. Gentle hand injection of contrast material confirms placement of the sheath within the renal collecting system. There is purulent urine. Moderate hydronephrosis is present. The tract from the scan into the renal collecting system was then dilated serially to 10-French. A 10-French Cook all-purpose drain was then placed and positioned under fluoroscopic guidance. The locking loop is well formed within the left renal pelvis. The catheter was secured to the skin with 2-0 Prolene and a sterile bandage was placed. Catheter was left to gravity bag drainage. The right flank was prepped with chlorhexidine in a sterile fashion, and a sterile drape was applied covering the operative field. A sterile gown and sterile gloves were used for the procedure. Local anesthesia was provided with 1% Lidocaine. The right flank was interrogated with ultrasound and the left kidney identified. The kidney is hydronephrotic. A suitable access site on the skin overlying the lower pole, posterior calix was identified. After local mg anesthesia was achieved, a small skin nick was made with an 11 blade scalpel. A 21 gauge Accustick needle was then advanced under direct sonographic guidance into the lower pole of the right kidney. A 0.018 inch wire was advanced under fluoroscopic guidance into the left renal collecting system. The Accustick sheath was then advanced over the wire and a 0.018 system exchanged for a 0.035 system. Gentle hand injection of contrast material confirms placement of the sheath within the renal collecting system. There is moderate hydronephrosis. The tract from the scan into the renal collecting system was then dilated serially to 10-French. A 10-French Cook all-purpose drain was then placed and positioned under fluoroscopic guidance. The locking loop  is well formed within the left renal pelvis. The catheter was secured to the skin with 2-0 Prolene and a sterile bandage was placed. Catheter was left to gravity bag  drainage. IMPRESSION: Successful placement of a bilateral 10 French percutaneous nephrostomy tubes. Of note, there was purulence within the urine at the time of puncture on the left suggesting that the left kidney is the source of infection. No definite purulence appreciated in the right renal collecting system. Signed, Criselda Peaches, MD, [place] [facility] Radiology Electronically Signed   By: Jacqulynn Cadet M.D.   On: 02/23/2019 13:30     ASSESSMENT AND PLAN:   ** Acute severe leukocytosis Treated with augmentin x several weeks and then levaquin x 2 weeks for presumed UTI Suspected due to failed outpatient complicated UTI given chronic bladder cancer with bilateral ureteral stents with chronic indwelling Foley - will need to discuss with infectious disease regarding continuation of antibiotics.  Currently on empiric Zosyn.  White cell count is trending down, all cultures are negative.  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 Increased mottled density in the bladder is noted consistent with the underlying neoplasm. The possibility of some underlying thrombus deserves consideration as well. Correlation with urine is recommended. Foley catheter is seen in place - Appreciate interventional radiology help and pt. Is  s/p bilateral nephrostomy tubes POD # 2.  Discussed with urology today and Foley catheter has been removed.  As per urology patient can be discharged and they will remove ureteral stents as an outpatient later this week.  ** Acute renal failure -Secondary to bilateral hydronephrosis and obstruction.  Cr. Trending down today and will cont. To monitor. - Suspect this is postobstructive.  Continue IV  fluids, Renal dose meds and avoid nephrotoxins.  - follow BUN/Cr.   ** Recent DVT ofrightleg - cont. Eliquis.   ** hx of Urinary Incontinence - cont. Myrbetriq.    Await PT eval but pt. May need SNF/STR.  Discussed with Urologist over the phone.   All the records are reviewed and case discussed with Care Management/Social Worker. Management plans discussed with the patient, family and they are in agreement.  CODE STATUS: Full code  DVT Prophylaxis: Ted's & SCD's.   TOTAL TIME TAKING CARE OF THIS PATIENT: 30 minutes.   POSSIBLE D/C IN 1-2 DAYS, DEPENDING ON CLINICAL CONDITION.   Henreitta Leber M.D on 02/25/2019 at 1:11 PM  Between 7am to 6pm - Pager - [phone]  After 6pm go to [email or web address] - Proofreader  [facility] [place] [facility]  Office  [phone]  CC: Primary care physician; [name], MD

## 2019-02-25 NOTE — Progress Notes (Signed)
PT Cancellation Note  Patient Details Name: Vincent Poole MRN: [id number] DOB: 1935/02/19   Cancelled Treatment:    Reason Eval/Treat Not Completed: Patient declined, no reason specified Patient reports he has had NV today and that he just wants to rest. Noted that he would attempt PT tomorrow  Shelton Silvas PT, DPT  Shelton Silvas 02/25/2019, 3:38 PM

## 2019-02-25 NOTE — Telephone Encounter (Signed)
APP MADE ° °

## 2019-02-25 NOTE — Consult Note (Signed)
Pharmacy Antibiotic Note  Vincent Poole is a 84 y.o. male admitted on 02/21/2019 with worsening leukocytosis and urine as a possible source.  Pharmacy has been consulted for Zosyn dosing, which infectious disease is recommending. SCr is elevated above his baseline level of approximately 1.1 mg/dL  4/20 SCr has improved from yesterday, however dosing will remain the same.  Plan: Will continue Zosyn 3.375g IV q12h (4 hr infusion)  Height: 5\' 11"  (180.3 cm) Weight: 165 lb 12.6 oz (75.2 kg) IBW/kg (Calculated) : 75.3  Temp (24hrs), Avg:98.9 F (37.2 C), Min:97.7 F (36.5 C), Max:100.1 F (37.8 C)  Recent Labs  Lab 02/21/19 1517 02/22/19 0343 02/23/19 0619 02/24/19 0719 02/25/19 0355  WBC 36.5* 34.7* 36.3* 31.9* 29.6*  CREATININE 2.62* 2.86* 3.46* 4.01* 3.10*  LATICACIDVEN 1.8  --   --   --   --     Estimated Creatinine Clearance: 19.9 mL/min (A) (by C-G formula based on SCr of 3.1 mg/dL (H)).    No Known Allergies  Antimicrobials this admission: Zosyn 4/16 >>   Microbiology results: 4/16 BCx: pending 4/16 UCx: pending   Thank you for allowing pharmacy to be a part of this patient's care.  Paulina Fusi, PharmD, BCPS 02/25/2019 8:40 AM

## 2019-02-25 NOTE — Progress Notes (Signed)
Urology Consult Follow Up  Subjective: [name] is resting comfortably anxiously waiting for breakfast.  He is having some mild bladder spasms.  WBC count 29.6 down from 45.7 five days ago.  Creatinine 3.10 down from 4.01 yesterday.  Bilateral nephrostomy tubes placed 02/23/2019.   Good UOP from the nephrostomy tubes.  No UOP from the Foley.   Anti-infectives: Anti-infectives (From admission, onward)   Start     Dose/Rate Route Frequency Ordered Stop   02/23/19 2200  piperacillin-tazobactam (ZOSYN) IVPB 3.375 g     3.375 g 12.5 mL/hr over 240 Minutes Intravenous Every 12 hours 02/23/19 0837     02/21/19 1600  piperacillin-tazobactam (ZOSYN) IVPB 3.375 g  Status:  Discontinued     3.375 g 12.5 mL/hr over 240 Minutes Intravenous Every 8 hours 02/21/19 1511 02/23/19 0837      Current Facility-Administered Medications  Medication Dose Route Frequency Provider Last Rate Last Dose  . 0.9 %  sodium chloride infusion   Intravenous Continuous Mayo, Pete Pelt, MD 75 mL/hr at 02/24/19 1625    . 0.9 %  sodium chloride infusion   Intravenous PRN Mayo, Pete Pelt, MD   Stopped at 02/21/19 1712  . acetaminophen (TYLENOL) tablet 650 mg  650 mg Oral Q6H PRN Sela Hua, MD   650 mg at 02/24/19 1617   Or  . acetaminophen (TYLENOL) suppository 650 mg  650 mg Rectal Q6H PRN Mayo, Pete Pelt, MD      . apixaban Arne [place]) tablet 5 mg  5 mg Oral BID Henreitta Leber, MD   5 mg at 02/24/19 2209  . feeding supplement (ENSURE ENLIVE) (ENSURE ENLIVE) liquid 237 mL  237 mL Oral TID BM Salary, Montell D, MD      . ferrous sulfate tablet 325 mg  325 mg Oral Q breakfast Mayo, Pete Pelt, MD   325 mg at 02/24/19 0751  . iohexol (OMNIPAQUE) 300 MG/ML solution 30 mL  30 mL Other Once PRN Jacqulynn Cadet, MD      . loratadine (CLARITIN) tablet 10 mg  10 mg Oral Daily Mayo, Pete Pelt, MD   10 mg at 02/24/19 0751  . mirabegron ER (MYRBETRIQ) tablet 50 mg  50 mg Oral Daily Mayo, Pete Pelt, MD   50 mg at 02/24/19 0751   . morphine 2 MG/ML injection 1 mg  1 mg Intravenous Q4H PRN Harrie Foreman, MD   1 mg at 02/23/19 0009  . multivitamin with minerals tablet 1 tablet  1 tablet Oral Daily Mayo, Pete Pelt, MD   1 tablet at 02/24/19 0751  . ondansetron (ZOFRAN) tablet 4 mg  4 mg Oral Q6H PRN Mayo, Pete Pelt, MD       Or  . ondansetron [facility]) injection 4 mg  4 mg Intravenous Q6H PRN Mayo, Pete Pelt, MD   4 mg at 02/22/19 1754  . oxyCODONE (Oxy IR/ROXICODONE) immediate release tablet 5 mg  5 mg Oral Q4H PRN Mayo, Pete Pelt, MD   5 mg at 02/24/19 1159  . piperacillin-tazobactam (ZOSYN) IVPB 3.375 g  3.375 g Intravenous Q12H Henreitta Leber, MD 12.5 mL/hr at 02/24/19 2208 3.375 g at 02/24/19 2208  . polyethylene glycol (MIRALAX / GLYCOLAX) packet 17 g  17 g Oral Daily PRN Mayo, Pete Pelt, MD      . vitamin B-12 (CYANOCOBALAMIN) tablet 500 mcg  500 mcg Oral Daily Mayo, Pete Pelt, MD   500 mcg at 02/24/19 0751     Objective: Vital signs in  last 24 hours: Temp:  [97.7 F (36.5 C)-100.1 F (37.8 C)] 97.9 F (36.6 C) (04/20 0422) Pulse Rate:  [96-104] 96 (04/20 0422) Resp:  [18-20] 18 (04/20 0422) BP: (110-121)/(54-59) 121/58 (04/20 0422) SpO2:  [95 %-98 %] 98 % (04/20 0422) Weight:  [75.2 kg] 75.2 kg (04/20 0422)  Intake/Output from previous day: 04/19 0701 - 04/20 0700 In: 1175.7 [I.V.:1117.2; IV Piggyback:58.6] Out: 3800 [Urine:3800] Intake/Output this shift: No intake/output data recorded.   Physical Exam Constitutional:  Well nourished. Alert and oriented, No acute distress.  Hard of hearing.   HEENT: [place] AT, moist mucus membranes.  Trachea midline, no masses.   Cardiovascular: No clubbing, cyanosis, or edema.  Fingernails dirty.   Respiratory: Normal respiratory effort, no increased work of breathing. GI: Abdomen is soft, non tender, non distended, no abdominal masses.  GU: No CVA tenderness.  Nephrostomy dressings are clean and dry.  Right nephrostomy tube draining very light pink urine.   Left nephrostomy tube draining clear yellow urine.  No bladder fullness or masses.  Patient with uncircumcised phallus.  Foley in place.  No urine output.  No penile discharge. No penile lesions or rashes.  Skin: No rashes, bruises or suspicious lesions. Neurologic: Grossly intact, no focal deficits, moving all 4 extremities. Psychiatric: Normal mood and affect.  Lab Results:  Recent Labs    02/24/19 0719 02/25/19 0355  WBC 31.9* 29.6*  HGB 8.4* 8.6*  HCT 26.7* 26.5*  PLT 270 270   BMET Recent Labs    02/24/19 0719 02/25/19 0355  NA 139 141  K 4.2 3.7  CL 111 112*  CO2 20* 20*  GLUCOSE 109* 102*  BUN 40* 34*  CREATININE 4.01* 3.10*  CALCIUM 8.4* 8.1*   PT/INR Recent Labs    02/23/19 0619  LABPROT 16.0*  INR 1.3*   ABG No results for input(s): PHART, HCO3 in the last 72 hours.  Invalid input(s): PCO2, PO2  Studies/Results: [place] Intraoperative  Result Date: 02/23/2019 INDICATION: 84 year old male with a history of locally advanced bladder cancer and persistent hydroureteronephrosis and obstructed uropathy despite the presence of bilateral double-J ureteral stents. He presents with sepsis likely due to obstructed pyonephrosis. Bilateral percutaneous nephrostomy tubes are warranted. EXAM: IR NEPHROSTOMY PLACEMENT LEFT; ULTRASOUND INTRAOPERATIVE-NRPT [facility]; IR NEPHROSTOMY PLACEMENT RIGHT COMPARISON:  None. MEDICATIONS: Patient is an inpatient receiving Zosyn every 8 hours. No additional antibiotic prophylaxis was administered. ANESTHESIA/SEDATION: Fentanyl 75 mcg IV; Versed 1 mg IV Moderate Sedation Time:  19 minutes The patient was continuously monitored during the procedure by the interventional radiology nurse under my direct supervision. CONTRAST:  15 mL Omnipaque 300-administered into the collecting system(s) FLUOROSCOPY TIME:  Fluoroscopy Time: 1 minutes 24 seconds (23.1 mGy). COMPLICATIONS: None immediate. TECHNIQUE: The procedure, risks, benefits, and alternatives were  explained to the patient. Questions regarding the procedure were encouraged and answered. The patient understands and consents to the procedure. The left flank was prepped with chlorhexidine in a sterile fashion, and a sterile drape was applied covering the operative field. A sterile gown and sterile gloves were used for the procedure. Local anesthesia was provided with 1% Lidocaine. The left flank was interrogated with ultrasound and the left kidney identified. The kidney is hydronephrotic. A suitable access site on the skin overlying the lower pole, posterior calix was identified. After local mg anesthesia was achieved, a small skin nick was made with an 11 blade scalpel. A 21 gauge Accustick needle was then advanced under direct sonographic guidance into the lower pole of the  left kidney. A 0.018 inch wire was advanced under fluoroscopic guidance into the left renal collecting system. The Accustick sheath was then advanced over the wire and a 0.018 system exchanged for a 0.035 system. Gentle hand injection of contrast material confirms placement of the sheath within the renal collecting system. There is purulent urine. Moderate hydronephrosis is present. The tract from the scan into the renal collecting system was then dilated serially to 10-French. A 10-French Cook all-purpose drain was then placed and positioned under fluoroscopic guidance. The locking loop is well formed within the left renal pelvis. The catheter was secured to the skin with 2-0 Prolene and a sterile bandage was placed. Catheter was left to gravity bag drainage. The right flank was prepped with chlorhexidine in a sterile fashion, and a sterile drape was applied covering the operative field. A sterile gown and sterile gloves were used for the procedure. Local anesthesia was provided with 1% Lidocaine. The right flank was interrogated with ultrasound and the left kidney identified. The kidney is hydronephrotic. A suitable access site on the skin  overlying the lower pole, posterior calix was identified. After local mg anesthesia was achieved, a small skin nick was made with an 11 blade scalpel. A 21 gauge Accustick needle was then advanced under direct sonographic guidance into the lower pole of the right kidney. A 0.018 inch wire was advanced under fluoroscopic guidance into the left renal collecting system. The Accustick sheath was then advanced over the wire and a 0.018 system exchanged for a 0.035 system. Gentle hand injection of contrast material confirms placement of the sheath within the renal collecting system. There is moderate hydronephrosis. The tract from the scan into the renal collecting system was then dilated serially to 10-French. A 10-French Cook all-purpose drain was then placed and positioned under fluoroscopic guidance. The locking loop is well formed within the left renal pelvis. The catheter was secured to the skin with 2-0 Prolene and a sterile bandage was placed. Catheter was left to gravity bag drainage. IMPRESSION: Successful placement of a bilateral 10 French percutaneous nephrostomy tubes. Of note, there was purulence within the urine at the time of puncture on the left suggesting that the left kidney is the source of infection. No definite purulence appreciated in the right renal collecting system. Signed, Criselda Peaches, MD, [place] [place] [facility] Radiology Electronically Signed   By: Jacqulynn Cadet M.D.   On: 02/23/2019 13:30   Ir Nephrostomy Placement Left  Result Date: 02/23/2019 INDICATION: 84 year old male with a history of locally advanced bladder cancer and persistent hydroureteronephrosis and obstructed uropathy despite the presence of bilateral double-J ureteral stents. He presents with sepsis likely due to obstructed pyonephrosis. Bilateral percutaneous nephrostomy tubes are warranted. EXAM: IR NEPHROSTOMY PLACEMENT LEFT; ULTRASOUND INTRAOPERATIVE-[facility]PT [facility]; IR  NEPHROSTOMY PLACEMENT RIGHT COMPARISON:  None. MEDICATIONS: Patient is an inpatient receiving Zosyn every 8 hours. No additional antibiotic prophylaxis was administered. ANESTHESIA/SEDATION: Fentanyl 75 mcg IV; Versed 1 mg IV Moderate Sedation Time:  19 minutes The patient was continuously monitored during the procedure by the interventional radiology nurse under my direct supervision. CONTRAST:  15 mL Omnipaque 300-administered into the collecting system(s) FLUOROSCOPY TIME:  Fluoroscopy Time: 1 minutes 24 seconds (23.1 mGy). COMPLICATIONS: None immediate. TECHNIQUE: The procedure, risks, benefits, and alternatives were explained to the patient. Questions regarding the procedure were encouraged and answered. The patient understands and consents to the procedure. The left flank was prepped with chlorhexidine in a sterile fashion, and a sterile  drape was applied covering the operative field. A sterile gown and sterile gloves were used for the procedure. Local anesthesia was provided with 1% Lidocaine. The left flank was interrogated with ultrasound and the left kidney identified. The kidney is hydronephrotic. A suitable access site on the skin overlying the lower pole, posterior calix was identified. After local mg anesthesia was achieved, a small skin nick was made with an 11 blade scalpel. A 21 gauge Accustick needle was then advanced under direct sonographic guidance into the lower pole of the left kidney. A 0.018 inch wire was advanced under fluoroscopic guidance into the left renal collecting system. The Accustick sheath was then advanced over the wire and a 0.018 system exchanged for a 0.035 system. Gentle hand injection of contrast material confirms placement of the sheath within the renal collecting system. There is purulent urine. Moderate hydronephrosis is present. The tract from the scan into the renal collecting system was then dilated serially to 10-French. A 10-French Cook all-purpose drain was then  placed and positioned under fluoroscopic guidance. The locking loop is well formed within the left renal pelvis. The catheter was secured to the skin with 2-0 Prolene and a sterile bandage was placed. Catheter was left to gravity bag drainage. The right flank was prepped with chlorhexidine in a sterile fashion, and a sterile drape was applied covering the operative field. A sterile gown and sterile gloves were used for the procedure. Local anesthesia was provided with 1% Lidocaine. The right flank was interrogated with ultrasound and the left kidney identified. The kidney is hydronephrotic. A suitable access site on the skin overlying the lower pole, posterior calix was identified. After local mg anesthesia was achieved, a small skin nick was made with an 11 blade scalpel. A 21 gauge Accustick needle was then advanced under direct sonographic guidance into the lower pole of the right kidney. A 0.018 inch wire was advanced under fluoroscopic guidance into the left renal collecting system. The Accustick sheath was then advanced over the wire and a 0.018 system exchanged for a 0.035 system. Gentle hand injection of contrast material confirms placement of the sheath within the renal collecting system. There is moderate hydronephrosis. The tract from the scan into the renal collecting system was then dilated serially to 10-French. A 10-French Cook all-purpose drain was then placed and positioned under fluoroscopic guidance. The locking loop is well formed within the left renal pelvis. The catheter was secured to the skin with 2-0 Prolene and a sterile bandage was placed. Catheter was left to gravity bag drainage. IMPRESSION: Successful placement of a bilateral 10 French percutaneous nephrostomy tubes. Of note, there was purulence within the urine at the time of puncture on the left suggesting that the left kidney is the source of infection. No definite purulence appreciated in the right renal collecting system. Signed,  Criselda Peaches, MD, [place] [facility] Radiology Electronically Signed   By: Jacqulynn Cadet M.D.   On: 02/23/2019 13:30   Ir Nephrostomy Placement Right  Result Date: 02/23/2019 INDICATION: 84 year old male with a history of locally advanced bladder cancer and persistent hydroureteronephrosis and obstructed uropathy despite the presence of bilateral double-J ureteral stents. He presents with sepsis likely due to obstructed pyonephrosis. Bilateral percutaneous nephrostomy tubes are warranted. EXAM: IR NEPHROSTOMY PLACEMENT LEFT; ULTRASOUND INTRAOPERATIVE-[facility]PT [facility]; IR NEPHROSTOMY PLACEMENT RIGHT COMPARISON:  None. MEDICATIONS: Patient is an inpatient receiving Zosyn every 8 hours. No additional antibiotic prophylaxis was administered. ANESTHESIA/SEDATION: Fentanyl 75 mcg IV; Versed 1  mg IV Moderate Sedation Time:  19 minutes The patient was continuously monitored during the procedure by the interventional radiology nurse under my direct supervision. CONTRAST:  15 mL Omnipaque 300-administered into the collecting system(s) FLUOROSCOPY TIME:  Fluoroscopy Time: 1 minutes 24 seconds (23.1 mGy). COMPLICATIONS: None immediate. TECHNIQUE: The procedure, risks, benefits, and alternatives were explained to the patient. Questions regarding the procedure were encouraged and answered. The patient understands and consents to the procedure. The left flank was prepped with chlorhexidine in a sterile fashion, and a sterile drape was applied covering the operative field. A sterile gown and sterile gloves were used for the procedure. Local anesthesia was provided with 1% Lidocaine. The left flank was interrogated with ultrasound and the left kidney identified. The kidney is hydronephrotic. A suitable access site on the skin overlying the lower pole, posterior calix was identified. After local mg anesthesia was achieved, a small skin nick was made with an 11 blade scalpel. A 21  gauge Accustick needle was then advanced under direct sonographic guidance into the lower pole of the left kidney. A 0.018 inch wire was advanced under fluoroscopic guidance into the left renal collecting system. The Accustick sheath was then advanced over the wire and a 0.018 system exchanged for a 0.035 system. Gentle hand injection of contrast material confirms placement of the sheath within the renal collecting system. There is purulent urine. Moderate hydronephrosis is present. The tract from the scan into the renal collecting system was then dilated serially to 10-French. A 10-French Cook all-purpose drain was then placed and positioned under fluoroscopic guidance. The locking loop is well formed within the left renal pelvis. The catheter was secured to the skin with 2-0 Prolene and a sterile bandage was placed. Catheter was left to gravity bag drainage. The right flank was prepped with chlorhexidine in a sterile fashion, and a sterile drape was applied covering the operative field. A sterile gown and sterile gloves were used for the procedure. Local anesthesia was provided with 1% Lidocaine. The right flank was interrogated with ultrasound and the left kidney identified. The kidney is hydronephrotic. A suitable access site on the skin overlying the lower pole, posterior calix was identified. After local mg anesthesia was achieved, a small skin nick was made with an 11 blade scalpel. A 21 gauge Accustick needle was then advanced under direct sonographic guidance into the lower pole of the right kidney. A 0.018 inch wire was advanced under fluoroscopic guidance into the left renal collecting system. The Accustick sheath was then advanced over the wire and a 0.018 system exchanged for a 0.035 system. Gentle hand injection of contrast material confirms placement of the sheath within the renal collecting system. There is moderate hydronephrosis. The tract from the scan into the renal collecting system was then  dilated serially to 10-French. A 10-French Cook all-purpose drain was then placed and positioned under fluoroscopic guidance. The locking loop is well formed within the left renal pelvis. The catheter was secured to the skin with 2-0 Prolene and a sterile bandage was placed. Catheter was left to gravity bag drainage. IMPRESSION: Successful placement of a bilateral 10 French percutaneous nephrostomy tubes. Of note, there was purulence within the urine at the time of puncture on the left suggesting that the left kidney is the source of infection. No definite purulence appreciated in the right renal collecting system. Signed, Criselda Peaches, MD, [place] [facility] Radiology Electronically Signed   By: Dellis Filbert.D.  On: 02/23/2019 13:30   Catheter Removal 10 ml of water was drained from the balloon. An 58 FR foley cath was removed from the bladder no complications were noted . Patient tolerated well.  Performed by: Zara Council, PA-C   Assessment and Plan:  1. Chronic high-grade urothelial carcinoma with bilateral indwelling stents and chronic indwelling Foley - Foley catheter is removed today - stable to discharge from urological standpoint  - Will plan for bilateral ureteral stent removal on 02/27/2019 in office with Dr. Erlene Quan    LOS: 4 days    [facility] 02/25/2019

## 2019-02-26 LAB — URINE CULTURE: Culture: NO GROWTH

## 2019-02-26 LAB — CULTURE, BLOOD (ROUTINE X 2)
Culture: NO GROWTH
Culture: NO GROWTH
Special Requests: ADEQUATE
Special Requests: ADEQUATE

## 2019-02-26 LAB — CBC
HCT: 29.4 % — ABNORMAL LOW (ref 39.0–52.0)
Hemoglobin: 9.2 g/dL — ABNORMAL LOW (ref 13.0–17.0)
MCH: 29.8 pg (ref 26.0–34.0)
MCHC: 31.3 g/dL (ref 30.0–36.0)
MCV: 95.1 fL (ref 80.0–100.0)
Platelets: 297 10*3/uL (ref 150–400)
RBC: 3.09 MIL/uL — ABNORMAL LOW (ref 4.22–5.81)
RDW: 14.6 % (ref 11.5–15.5)
WBC: 27.6 10*3/uL — ABNORMAL HIGH (ref 4.0–10.5)
nRBC: 0 % (ref 0.0–0.2)

## 2019-02-26 LAB — BASIC METABOLIC PANEL
Anion gap: 9 (ref 5–15)
BUN: 25 mg/dL — ABNORMAL HIGH (ref 8–23)
CO2: 21 mmol/L — ABNORMAL LOW (ref 22–32)
Calcium: 8.1 mg/dL — ABNORMAL LOW (ref 8.9–10.3)
Chloride: 108 mmol/L (ref 98–111)
Creatinine, Ser: 2.26 mg/dL — ABNORMAL HIGH (ref 0.61–1.24)
GFR calc Af Amer: 30 mL/min — ABNORMAL LOW (ref >60)
GFR calc non Af Amer: 26 mL/min — ABNORMAL LOW (ref >60)
Glucose, Bld: 94 mg/dL (ref 70–99)
Potassium: 3.6 mmol/L (ref 3.5–5.1)
Sodium: 138 mmol/L (ref 135–145)

## 2019-02-26 MED ORDER — AMOXICILLIN-POT CLAVULANATE 250-125 MG PO TABS: 1.0000 | ORAL_TABLET | Freq: Two times a day (BID) | ORAL | Status: AC

## 2019-02-26 MED ORDER — OXYCODONE HCL 5 MG PO TABS: 5.0000 mg | ORAL_TABLET | ORAL | 0 refills | Status: DC | PRN

## 2019-02-26 NOTE — Discharge Summary (Signed)
[facility] NAME: Vincent Poole    MR#:  [id number]  DATE OF BIRTH:  Mar 12, 1935  DATE OF ADMISSION:  02/21/2019 ADMITTING PHYSICIAN: Sela Hua, MD  DATE OF DISCHARGE: 02/26/2019  PRIMARY CARE PHYSICIAN: [name], MD    ADMISSION DIAGNOSIS:  Hydronephrosis  DISCHARGE DIAGNOSIS:  Active Problems:   Leukocytosis   SECONDARY DIAGNOSIS:   Past Medical History:  Diagnosis Date  . Anxiety   . Arthritis   . Bladder cancer ([place])   . Dysrhythmia 07/2018   history of atrial flutter that worsens with anxiety  . Femur fracture, right ([place]) 05/01/2018  . GERD (gastroesophageal reflux disease)   . History of recent blood transfusion 05/2018  . Hypertension   . Iron deficiency anemia 09/14/2018  . Prostate cancer ([place]) 07/2018   cancer growing in prostate but not prostate cancer, it is from the bladder  . Umbilical hernia 00/9233  . Urinary retention 2019   foley catheter place 11/2017  . UTI (urinary tract infection) 2019   frequent UTI's over last year  . Wound eschar of foot 07/2018   left heal getting wrapped and requiring antibiotic cream. cracks open with weight bearing.    HOSPITAL COURSE:   84 yo male with past medical history of bladder cancer, hypertension, GERD, history of prostate cancer, iron deficiency anemia, anxiety, osteoarthritis, urinary retention who presented to the hospital due to worsening/persistent leukocytosis.  1. Acute severe leukocytosis-suspected to be secondary to sepsis/recurrent UTI. He was treated with augmentin x several weeks and then levaquin x 2 weeks for presumed UTI but despite that continued to have a persistent leukocytosis and therefore was admitted to the hospital. - Seen by infectious disease and empirically placed on IV Zosyn. - Patient was seen by urology and they recommended removal of his previous ureteral stents and placement of nephrostomy tubes.  Patient now has his  Foley removed, he is status post bilateral nephrostomy tube placement postop day #3 today.  His leukocytosis has trended down.  He is afebrile.  His cultures here have been negative. - At present patient will be discharged on empiric Augmentin for a few more days with outpatient follow-up with urology.  2.  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  CT abdomen:Bilateral ureteral stents in satisfactory position with evidence of stable chronic hydronephrosis bilaterally. Increased mottled density in the bladder is noted consistent with the underlying neoplasm. The possibility of some underlying thrombus deserves consideration as well. Correlation with urine is recommended. Foley catheter is seen in place  Urology recommended bilateral nephrostomy tube placements.  Patient underwent that and is currently postop day #3.  He has had no abdominal pain, fever, chills or nausea vomiting. -His nephrostomy tubes are functioning well.  His creatinine has trended down.  He is being discharged to a skilled nursing facility today.  He will follow-up with his urologist tomorrow to get his ureteral stents removed.  He is to follow-up with Dr. Erlene Quan.  3. Acute renal failure- this was secondary to bilateral hydronephrosis and obstruction.  Patient is now status post bilateral nephrostomy tube placements by interventional radiology. -Creatinine on admission was 2.6 and went up to as high as 4.0.  It has since come down and on day of discharge is 2.2.  Patient's creatinine can be further followed  as an outpatient.  His nephrostomy tubes are functioning well and draining well.  4. Recent DVT ofrightleg - pt. Will cont. His Eliquis  5. hx of Urinary Incontinence - cont. Myrbetriq.    6. Bladder Cancer - currently  ongoing treatment. Cont. Follow up with Dr. [name] as outpatient.    Stable for discharge to SNF.  Called wife and updated her and she is in agreement.   DISCHARGE CONDITIONS:   Stable.   CONSULTS OBTAINED:  Treatment Team:  [name], MD  DRUG ALLERGIES:  No Known Allergies  DISCHARGE MEDICATIONS:   Allergies as of 02/26/2019   No Known Allergies     Medication List    TAKE these medications   acetaminophen 500 MG tablet Commonly known as:  TYLENOL Take 1,000 mg by mouth every 6 (six) hours as needed for moderate pain or headache.   amoxicillin-clavulanate 250-125 MG tablet Commonly known as:  AUGMENTIN Take 1 tablet by mouth 2 (two) times daily for 5 days.   cetirizine 10 MG tablet Commonly known as:  ZYRTEC Take 10 mg by mouth daily as needed for allergies.   Eliquis 5 MG Tabs tablet Generic drug:  apixaban TAKE ONE TABLET BY MOUTH TWICE DAILY What changed:  how much to take   ferrous sulfate 325 (65 FE) MG tablet Take 1 tablet (325 mg total) by mouth 2 (two) times daily with a meal.   lidocaine-prilocaine cream Commonly known as:  EMLA Apply 1 application topically as needed.   multivitamin with minerals Tabs tablet Take 1 tablet by mouth daily.   Myrbetriq 50 MG Tb24 tablet Generic drug:  mirabegron ER TAKE ONE TABLET EVERY DAY What changed:  how much to take   nystatin powder Commonly known as:  Nyamyc Apply topically 2 (two) times daily.   oxyCODONE 5 MG immediate release tablet Commonly known as:  Oxy IR/ROXICODONE Take 1 tablet (5 mg total) by mouth every 4 (four) hours as needed for moderate pain or severe pain.   polyethylene glycol powder 17 GM/SCOOP powder Commonly known as:  MiraLax Take 17 g by mouth daily as needed. Can increase to 3 times a day as needed for constipation but hold medication if has diarrhea   potassium chloride SA 20 MEQ tablet Commonly known as:  K-DUR Take 20 mEq by mouth 2 (two) times daily.   Super  Cranberry/Vitamin D3 4200-500 MG-UNIT Caps Generic drug:  Cranberry-Cholecalciferol Take 1 capsule by mouth 2 (two) times daily.   vitamin B-12 500 MCG tablet Commonly known as:  CYANOCOBALAMIN Take 500 mcg by mouth daily.   zinc oxide 20 % ointment Apply 1 application topically as needed for irritation.         DISCHARGE INSTRUCTIONS:   DIET:  Regular diet  DISCHARGE CONDITION:  Stable  ACTIVITY:  Activity as tolerated  OXYGEN:  Home Oxygen: No.   Oxygen Delivery: room air  DISCHARGE LOCATION:  nursing home   If you experience worsening of your admission symptoms, develop shortness of breath, life threatening emergency, suicidal or homicidal thoughts you must seek medical attention immediately by calling 911 or calling your MD immediately  if symptoms less severe.  You Must read complete instructions/literature along with all the possible adverse reactions/side effects for all the Medicines you take and that have been prescribed to you. Take any new Medicines after you have completely understood and accpet all the possible adverse reactions/side effects.   Please note  You were cared for by a hospitalist during  your hospital stay. If you have any questions about your discharge medications or the care you received while you were in the hospital after you are discharged, you can call the unit and asked to speak with the hospitalist on call if the hospitalist that took care of you is not available. Once you are discharged, your primary care physician will handle any further medical issues. Please note that NO REFILLS for any discharge medications will be authorized once you are discharged, as it is imperative that you return to your primary care physician (or establish a relationship with a primary care physician if you do not have one) for your aftercare needs so that they can reassess your need for medications and monitor your lab values.     Today   No acute events  overnight.  No complaints presently.  Creatinine is trending down.  Nephrostomy tubes are functioning well.  Still has generalized weakness.  Will discharge to skilled nursing facility with outpatient follow-up with oncology and urology.  VITAL SIGNS:  Blood pressure 126/60, pulse 97, temperature 98.2 F (36.8 C), temperature source Oral, resp. rate 20, height 5\' 11"  (1.803 m), weight 75.2 kg, SpO2 97 %.  I/O:    Intake/Output Summary (Last 24 hours) at 02/26/2019 1054 Last data filed at 02/26/2019 1014 Gross per 24 hour  Intake 3893.53 ml  Output 3500 ml  Net 393.53 ml    PHYSICAL EXAMINATION:   GENERAL:  84 y.o.-year-old patient lying in bed in no acute distress.  EYES: Pupils equal, round, reactive to light and accommodation. No scleral icterus. Extraocular muscles intact.  HEENT: Head atraumatic, normocephalic. Oropharynx and nasopharynx clear.  NECK:  Supple, no jugular venous distention. No thyroid enlargement, no tenderness.  LUNGS: Normal breath sounds bilaterally, no wheezing, rales, rhonchi. No use of accessory muscles of respiration.  CARDIOVASCULAR: S1, S2 normal. No murmurs, rubs, or gallops.  ABDOMEN: Soft, nontender, nondistended. Bowel sounds present. No organomegaly or mass.  EXTREMITIES: No cyanosis, clubbing or edema b/l.    NEUROLOGIC: Cranial nerves II through XII are intact. No focal Motor or sensory deficits b/l.  Globally weak.  PSYCHIATRIC: The patient is alert and oriented x 3.  SKIN: No obvious rash, lesion, or ulcer.   BiLateral nephrostomy tubes in place with yellow urine draining.   DATA REVIEW:   CBC Recent Labs  Lab 02/26/19 0742  WBC 27.6*  HGB 9.2*  HCT 29.4*  PLT 297    Chemistries  Recent Labs  Lab 02/21/19 1517  02/26/19 0742  NA 135   < > 138  K 4.8   < > 3.6  CL 105   < > 108  CO2 19*   < > 21*  GLUCOSE 116*   < > 94  BUN 37*   < > 25*  CREATININE 2.62*   < > 2.26*  CALCIUM 8.8*   < > 8.1*  AST 13*  --   --   ALT 8  --    --   ALKPHOS 91  --   --   BILITOT 0.5  --   --    < > = values in this interval not displayed.    Cardiac Enzymes No results for input(s): TROPONINI in the last 168 hours.  Microbiology Results  Results for orders placed or performed during the hospital encounter of 02/21/19  CULTURE, BLOOD (ROUTINE X 2) w Reflex to ID Panel     Status: None   Collection Time: 02/21/19  3:18 PM  Result Value Ref Range Status   Specimen Description BLOOD BLOOD LEFT ARM  Final   Special Requests   Final    BOTTLES DRAWN AEROBIC AND ANAEROBIC Blood Culture adequate volume   Culture   Final    NO GROWTH 5 DAYS Performed at [facility], [address] [place], [place] [address]    Report Status 02/26/2019 FINAL  Final  CULTURE, BLOOD (ROUTINE X 2) w Reflex to ID Panel     Status: None   Collection Time: 02/21/19  3:32 PM  Result Value Ref Range Status   Specimen Description BLOOD BLOOD RIGHT WRIST  Final   Special Requests   Final    BOTTLES DRAWN AEROBIC AND ANAEROBIC Blood Culture adequate volume   Culture   Final    NO GROWTH 5 DAYS Performed at [facility], [address], [place], [place] [address]    Report Status 02/26/2019 FINAL  Final  Urine Culture     Status: None   Collection Time: 02/22/19  2:23 AM  Result Value Ref Range Status   Specimen Description   Final    URINE, RANDOM Performed at [facility], [address], [place], [place] [address]    Special Requests   Final    NONE Performed at [facility], [address] [place], [place] [address]    Culture   Final    NO GROWTH Performed at [facility], [place] [address] [place], [place] [address]    Report Status 02/23/2019 FINAL  Final    RADIOLOGY:  No results found.    Management plans discussed with the patient, family and they are in agreement.  CODE STATUS:     Code Status Orders  (From admission, onward)         Start     Ordered   02/21/19 1506  Full  code  Continuous     02/21/19 1505        TOTAL TIME TAKING CARE OF THIS PATIENT: 45 minutes.    Henreitta Leber M.D on 02/26/2019 at 10:54 AM  Between 7am to 6pm - Pager - [phone]  After 6pm go to [email or web address] - Proofreader  [facility]  Office  [phone]  CC: Primary care physician; [name], MD

## 2019-02-26 NOTE — TOC Progression Note (Signed)
Transition of Care [facility]) - Progression Note    Patient Details  Name: Vincent Poole MRN: [id number] Date of Birth: [birth year]  Transition of Care [facility]) CM/SW [place], RN Phone Number: 02/26/2019, 8:41 AM  Clinical Narrative:    Spoke with the patient about the bed choices, he stated he wanted to go to [facility] because it is the closest, Sent the acceptance to [place] thru [facility]   Expected Discharge Plan: [place] Barriers to Discharge: Continued Medical Work up  Expected Discharge Plan and Services Expected Discharge Plan: [place]   Discharge Planning Services: CM Consult   Living arrangements for the past 2 months: Single Family Home                 DME Arranged: (states that he has no needs)         Social Determinants of Health (SDOH) Interventions    Readmission Risk Interventions Readmission Risk Prevention Plan 02/22/2019  Transportation Screening Complete  HRI or Home Care Consult Complete  Medication Review (RN Care Manager) Complete  Some recent data might be hidden

## 2019-02-26 NOTE — TOC Progression Note (Signed)
Transition of Care [facility]) - Progression Note    Patient Details  Name: Vincent Poole MRN: [id number] Date of Birth: 1935-10-04  Transition of Care [facility]) CM/SW Contact  Su Hilt, RN Phone Number: 02/26/2019, 9:36 AM  Clinical Narrative:    Spoke with the wife [name], we reviewed the bed offers and she agreed to [facility] in [place].  She stated that she would order the patient's lunch for him today.  I explained that he will go today.  The patient will transport via EMS, he has an appointment tomorrow at [facility] to have stents removed  Expected Discharge Plan: [place] Barriers to Discharge: Continued Medical Work up  Expected Discharge Plan and Services Expected Discharge Plan: [place]   Discharge Planning Services: CM Consult   Living arrangements for the past 2 months: Single Family Home                 DME Arranged: (states that he has no needs)         Social Determinants of Health (SDOH) Interventions    Readmission Risk Interventions Readmission Risk Prevention Plan 02/22/2019  Transportation Screening Complete  HRI or Home Care Consult Complete  Medication Review Press photographer) Complete  Some recent data might be hidden

## 2019-02-26 NOTE — Progress Notes (Signed)
Pt HR elevates to 140 when ambulating. Pt is deconditioned. HR comes back down to 108-112 at rest. MD made aware. Per MD pt is deconditioned. Okay to discharge to facility.

## 2019-02-26 NOTE — Progress Notes (Signed)
Physical Therapy Treatment Patient Details Name: Vincent Poole MRN: [id number] DOB: Dec 09, 1934 Today's Date: 02/26/2019    History of Present Illness Vincent Poole  is a 84 y.o. male with a known history of bladder cancer with bilateral indwelling stents and chronic indwelling foley  who was directly admitted to hospital on 02/21/2019 from the urology office for worsening leucocytosis in the setting of worsening b/l hydronephrosis, ureteral stents and foley catheter. He as been recently treated for enterococcus fecalis in the urine by his oncologist first with Augmentin and then levaquin. Relevant PMH includes DVT diagnosed 01/04/2019,  GERD, anxiety, arthrits, history of atrial flutter, R femur fracture, HTN, prostate cancer, umbilical hernia, wound eschar L of foot, urinary retention with urinary catheter, multiple bladder surgeries/procedures.     PT Comments    Patient requires cuing for STS for safety and hand placement. Patient is able to complete 3x STS transfer with minA x2 and x1 for subsequent trials with decent safety carry over. Patient requires minA for ambulation with cuing for normalized gait and walker management. After 54ft of ambulation patient becomes very fatigued and requires chair to sit. Would benefit from skilled PT to address above deficits and promote optimal return to PLOF   Follow Up Recommendations  SNF     Equipment Recommendations  3in1 (PT)    Recommendations for Other Services OT consult     Precautions / Restrictions Precautions Precautions: Fall Restrictions Weight Bearing Restrictions: No    Mobility  Bed Mobility Overal bed mobility: Needs Assistance Bed Mobility: Supine to Sit     Supine to sit: Min assist     General bed mobility comments: Patient requiring assist with trunk to sit EOB, able to remain sittine without assistance  Transfers Overall transfer level: Needs assistance Equipment used: Rolling walker (2 wheeled) Transfers:  Sit to/from Stand Sit to Stand: Min assist;From elevated surface;+2 physical assistance         General transfer comment: Upon standing patient had bowel movement. Was able to stand again +1 mod and take shuffle steps to bedside commode. After using BSC patient is able to stand +11min and requires cuing to remain standing upright.   Ambulation/Gait Ambulation/Gait assistance: Min assist Gait Distance (Feet): 15 Feet Assistive device: Rolling walker (2 wheeled) Gait Pattern/deviations: Shuffle;Decreased stride length;Trunk flexed Gait velocity: decreased   General Gait Details: ambulated approximate 15 feet with RW and min A +2 second person for chair follow. Patient demo stooped posture which he is unable to correct with cuing,    Stairs             Wheelchair Mobility    Modified Rankin (Stroke Patients Only)       Balance                                            Cognition Arousal/Alertness: Awake/alert Behavior During Therapy: WFL for tasks assessed/performed;Anxious Overall Cognitive Status: No family/caregiver present to determine baseline cognitive functioning                                 General Comments: Patient became tearful following loss of bowel and endorsed feeling distressed over health status      Exercises Other Exercises Other Exercises: Patient able to stand +2 min with cuing for handplacement and RW safety negotiation.  Once standing patient has a BM which is very discouraging to him. Patient is able to take shuffle steps to [facility] with min gaurd. Patient is able to complete subsequent STS with cuing for hand placement and is able to ambulate 15 ft with minA for safety and RW management before he had to sit in chair d/t fatigue    General Comments        Pertinent Vitals/Pain Pain Assessment: No/denies pain    Home Living Family/patient expects to be discharged to:: Private residence                     Prior Function            PT Goals (current goals can now be found in the care plan section) Acute Rehab PT Goals Patient Stated Goal: return home, get better PT Goal Formulation: With patient Time For Goal Achievement: 03/08/19 Potential to Achieve Goals: Fair Progress towards PT goals: Progressing toward goals    Frequency    Min 2X/week      PT Plan      Co-evaluation              AM-PAC PT "6 Clicks" Mobility   Outcome Measure  Help needed turning from your back to your side while in a flat bed without using bedrails?: A Little Help needed moving from lying on your back to sitting on the side of a flat bed without using bedrails?: A Little Help needed moving to and from a bed to a chair (including a wheelchair)?: A Little Help needed standing up from a chair using your arms (e.g., wheelchair or bedside chair)?: A Little Help needed to walk in hospital room?: A Lot Help needed climbing 3-5 steps with a railing? : Total 6 Click Score: 15    End of Session Equipment Utilized During Treatment: Gait belt Activity Tolerance: Patient tolerated treatment well;Patient limited by fatigue Patient left: in chair;with call bell/phone within reach;with chair alarm set Nurse Communication: Mobility status PT Visit Diagnosis: Unsteadiness on feet (R26.81);Muscle weakness (generalized) (M62.81);Difficulty in walking, not elsewhere classified (R26.2)     Time: 0349-1791 PT Time Calculation (min) (ACUTE ONLY): 34 min  Charges:  $Therapeutic Activity: 23-37 mins                     Shelton Silvas PT, DPT  Shelton Silvas 02/26/2019, 1:03 PM

## 2019-02-27 ENCOUNTER — Telehealth: Payer: Self-pay

## 2019-02-27 ENCOUNTER — Ambulatory Visit: Payer: Medicare Other | Admitting: Radiation Oncology

## 2019-02-27 ENCOUNTER — Other Ambulatory Visit: Payer: Medicare Other | Admitting: Urology

## 2019-02-27 NOTE — Telephone Encounter (Signed)
[place] in [place] and spoke with [place]. I gave her a verbal order to please draw blood next week (CBC w/ Diff and Met C) and fax [place] the results at [phone]. Theron Arista stated that she would.

## 2019-02-28 ENCOUNTER — Encounter: Payer: Self-pay | Admitting: Urology

## 2019-02-28 ENCOUNTER — Ambulatory Visit (INDEPENDENT_AMBULATORY_CARE_PROVIDER_SITE_OTHER): Payer: Medicare Other | Admitting: Urology

## 2019-02-28 ENCOUNTER — Other Ambulatory Visit: Payer: Self-pay

## 2019-02-28 VITALS — BP 121/79 | HR 109 | Ht 71.0 in

## 2019-02-28 DIAGNOSIS — C679 Malignant neoplasm of bladder, unspecified: Secondary | ICD-10-CM | POA: Diagnosis not present

## 2019-02-28 NOTE — Progress Notes (Signed)
Cystoscopy Procedure Note:  Indication: Bilateral stent removal after recent bilateral NT placement. Locally advanced bladder cancer. Patient of Dr. Audree Bane, and she was unavailable today and I was asked to remove his bilateral ureteral stents.  After informed consent and discussion of the procedure and its risks, Vincent Poole was positioned and prepped in the standard fashion. Cystoscopy was performed with a flexible cystoscope. There was papillary tumor throughout the prostatic urethra and bladder that made visualization very poor. Both stents were grasped individually and removed.  Findings: Uncomplicated stent removal x2  Assessment and Plan: Keep follow up with Dr. Erlene Quan, maintain bilateral NT's  Billey [place], MD 02/28/2019

## 2019-03-06 ENCOUNTER — Telehealth: Payer: Self-pay | Admitting: Urology

## 2019-03-06 NOTE — Telephone Encounter (Signed)
This patient will need to be scheduled for nephrostomy tube exchange in about 5 weeks from now.  Please do so.  I can see him for virtual visit in a few weeks if they have a smart phone.  [name], MD

## 2019-03-06 NOTE — Telephone Encounter (Signed)
Dr. Diamantina Providence removed bilateral stents last week on Vincent Poole, notes say to keep follow up appt with Erlene Quan, Pt wife calling asking when is his next appt. How would you like to proceed with next appt? Please advise. Thanks.

## 2019-03-11 ENCOUNTER — Other Ambulatory Visit: Payer: Self-pay | Admitting: Radiology

## 2019-03-11 DIAGNOSIS — N1339 Other hydronephrosis: Secondary | ICD-10-CM

## 2019-03-18 ENCOUNTER — Inpatient Hospital Stay: Payer: Medicare Other

## 2019-03-18 ENCOUNTER — Inpatient Hospital Stay: Payer: Medicare Other | Admitting: Oncology

## 2019-03-19 ENCOUNTER — Other Ambulatory Visit: Payer: Self-pay

## 2019-03-19 ENCOUNTER — Telehealth (INDEPENDENT_AMBULATORY_CARE_PROVIDER_SITE_OTHER): Payer: Medicare Other | Admitting: Urology

## 2019-03-19 DIAGNOSIS — D72829 Elevated white blood cell count, unspecified: Secondary | ICD-10-CM

## 2019-03-19 DIAGNOSIS — N135 Crossing vessel and stricture of ureter without hydronephrosis: Secondary | ICD-10-CM | POA: Diagnosis not present

## 2019-03-19 DIAGNOSIS — C678 Malignant neoplasm of overlapping sites of bladder: Secondary | ICD-10-CM

## 2019-03-19 NOTE — Progress Notes (Signed)
Virtual Visit via Telephone Note  I connected with [name] on 03/19/19 at 11:30 AM EDT by telephone and verified that I am speaking with the correct person using two identifiers.  Location: Patient: home Provider: home   I discussed the limitations, risks, security and privacy concerns of performing an evaluation and management service by telephone and the availability of in person appointments. I also discussed with the patient that there may be a patient responsible charge related to this service. The patient expressed understanding and agreed to proceed.   History of Present Illness: 84 yo M with personal history of locally advanced bladder cancer who present today via telephone visit.    Most recently, he was noted to have rising WBC concerning for infection, presumably urinary in nature.  He was admitted on 02/21/2019 for IV Zosyn, conversion of his double-J ureteral stents to bilateral nephrostomy tubes and Foley removal.  He ultimately was discharged to rehab where he stayed for 5 days.  His blood and urine cultures were negative.  He is now completed antibiotics.  While in rehab, he did not do particularly well.  His wife reports that he only got him up once.  After they got him home, they have been ambulating him every day and he seems to be regaining both his strength and stamina.  He is going to the cancer center later this week for labs/possible immunotherapy instillation with Dr. [name].  Overall, has been tolerating the nephrostomy tubes quite well.  He continues to have some occasional bladder spasms which are decreasing in frequency.  He still taking Myrbetriq for this.  He also is having a small amount of penile discharge which is malodorous with some debris.  His nephrostomy tubes are draining well.  Sites are clean dry and intact.  They have had help with family members to have medical training to help with care of his nephrostomies.  He prefers the nephrostomy tubes  over the penile Foley.   Observations/Objective: Mr. Bogan was present today, however his wife did most of the talking.  Assessment and Plan:  1. Bilateral ureteral obstruction Now removed, managed with bilateral nephrostomy tubes Nephrostomy tube exchange scheduled for mid June Seems to be doing better with tubes compared to stents and Foley Nephrostomy tube care  2. Malignant neoplasm of overlapping sites of bladder [facility]) Followed by Dr. [name], seeing her later this week for possible palliative immunotherapy infusion Discussed that discharge from bladder is normal with sloughing of cancer cells, old blood clot, and a small amount of urine Continue to use Myrbetriq as needed for bladder spasm/pain  3. Leukocytosis, unspecified type Etiology unclear infection versus cancer related Labs rechecked later this week by Dr. [name]   Follow Up Instructions: F/u 2 months   I discussed the assessment and treatment plan with the patient. The patient was provided an opportunity to ask questions and all were answered. The patient agreed with the plan and demonstrated an understanding of the instructions.   The patient was advised to call back or seek an in-person evaluation if the symptoms worsen or if the condition fails to improve as anticipated.  I provided 12 minutes of non-face-to-face time during this encounter.   [name], MD

## 2019-03-20 ENCOUNTER — Other Ambulatory Visit: Payer: Self-pay | Admitting: *Deleted

## 2019-03-20 DIAGNOSIS — C689 Malignant neoplasm of urinary organ, unspecified: Secondary | ICD-10-CM

## 2019-03-21 ENCOUNTER — Encounter: Payer: Self-pay | Admitting: Oncology

## 2019-03-21 ENCOUNTER — Other Ambulatory Visit: Payer: Self-pay

## 2019-03-21 ENCOUNTER — Inpatient Hospital Stay (HOSPITAL_BASED_OUTPATIENT_CLINIC_OR_DEPARTMENT_OTHER): Payer: Medicare Other | Admitting: Oncology

## 2019-03-21 ENCOUNTER — Inpatient Hospital Stay: Payer: Medicare Other | Attending: Oncology

## 2019-03-21 ENCOUNTER — Other Ambulatory Visit: Payer: Self-pay | Admitting: *Deleted

## 2019-03-21 ENCOUNTER — Inpatient Hospital Stay: Payer: Medicare Other

## 2019-03-21 ENCOUNTER — Telehealth: Payer: Self-pay | Admitting: Urology

## 2019-03-21 VITALS — HR 84

## 2019-03-21 VITALS — BP 120/72 | HR 102 | Temp 97.8°F | Resp 18

## 2019-03-21 DIAGNOSIS — I10 Essential (primary) hypertension: Secondary | ICD-10-CM | POA: Insufficient documentation

## 2019-03-21 DIAGNOSIS — Z5112 Encounter for antineoplastic immunotherapy: Secondary | ICD-10-CM

## 2019-03-21 DIAGNOSIS — Z79899 Other long term (current) drug therapy: Secondary | ICD-10-CM | POA: Insufficient documentation

## 2019-03-21 DIAGNOSIS — Z8546 Personal history of malignant neoplasm of prostate: Secondary | ICD-10-CM | POA: Insufficient documentation

## 2019-03-21 DIAGNOSIS — R5381 Other malaise: Secondary | ICD-10-CM | POA: Insufficient documentation

## 2019-03-21 DIAGNOSIS — Z87440 Personal history of urinary (tract) infections: Secondary | ICD-10-CM | POA: Diagnosis not present

## 2019-03-21 DIAGNOSIS — C689 Malignant neoplasm of urinary organ, unspecified: Secondary | ICD-10-CM

## 2019-03-21 DIAGNOSIS — C678 Malignant neoplasm of overlapping sites of bladder: Secondary | ICD-10-CM

## 2019-03-21 DIAGNOSIS — N136 Pyonephrosis: Secondary | ICD-10-CM

## 2019-03-21 DIAGNOSIS — Z9221 Personal history of antineoplastic chemotherapy: Secondary | ICD-10-CM | POA: Insufficient documentation

## 2019-03-21 DIAGNOSIS — M1990 Unspecified osteoarthritis, unspecified site: Secondary | ICD-10-CM

## 2019-03-21 DIAGNOSIS — K5900 Constipation, unspecified: Secondary | ICD-10-CM

## 2019-03-21 DIAGNOSIS — Z923 Personal history of irradiation: Secondary | ICD-10-CM | POA: Diagnosis not present

## 2019-03-21 DIAGNOSIS — R5383 Other fatigue: Secondary | ICD-10-CM

## 2019-03-21 DIAGNOSIS — Z7901 Long term (current) use of anticoagulants: Secondary | ICD-10-CM | POA: Insufficient documentation

## 2019-03-21 DIAGNOSIS — R531 Weakness: Secondary | ICD-10-CM | POA: Insufficient documentation

## 2019-03-21 DIAGNOSIS — Z436 Encounter for attention to other artificial openings of urinary tract: Secondary | ICD-10-CM | POA: Insufficient documentation

## 2019-03-21 DIAGNOSIS — D72829 Elevated white blood cell count, unspecified: Secondary | ICD-10-CM

## 2019-03-21 DIAGNOSIS — N179 Acute kidney failure, unspecified: Secondary | ICD-10-CM | POA: Diagnosis not present

## 2019-03-21 DIAGNOSIS — R000 Tachycardia, unspecified: Secondary | ICD-10-CM | POA: Insufficient documentation

## 2019-03-21 DIAGNOSIS — Z95828 Presence of other vascular implants and grafts: Secondary | ICD-10-CM

## 2019-03-21 DIAGNOSIS — G893 Neoplasm related pain (acute) (chronic): Secondary | ICD-10-CM

## 2019-03-21 DIAGNOSIS — F419 Anxiety disorder, unspecified: Secondary | ICD-10-CM | POA: Insufficient documentation

## 2019-03-21 DIAGNOSIS — K219 Gastro-esophageal reflux disease without esophagitis: Secondary | ICD-10-CM | POA: Insufficient documentation

## 2019-03-21 DIAGNOSIS — R410 Disorientation, unspecified: Secondary | ICD-10-CM | POA: Insufficient documentation

## 2019-03-21 DIAGNOSIS — C679 Malignant neoplasm of bladder, unspecified: Secondary | ICD-10-CM

## 2019-03-21 LAB — CBC WITH DIFFERENTIAL/PLATELET
Abs Immature Granulocytes: 0.61 10*3/uL — ABNORMAL HIGH (ref 0.00–0.07)
Basophils Absolute: 0.2 10*3/uL — ABNORMAL HIGH (ref 0.0–0.1)
Basophils Relative: 0 %
Eosinophils Absolute: 0.2 10*3/uL (ref 0.0–0.5)
Eosinophils Relative: 0 %
HCT: 29.5 % — ABNORMAL LOW (ref 39.0–52.0)
Hemoglobin: 9.6 g/dL — ABNORMAL LOW (ref 13.0–17.0)
Immature Granulocytes: 2 %
Lymphocytes Relative: 6 %
Lymphs Abs: 2.4 10*3/uL (ref 0.7–4.0)
MCH: 30.5 pg (ref 26.0–34.0)
MCHC: 32.5 g/dL (ref 30.0–36.0)
MCV: 93.7 fL (ref 80.0–100.0)
Monocytes Absolute: 2.2 10*3/uL — ABNORMAL HIGH (ref 0.1–1.0)
Monocytes Relative: 5 %
Neutro Abs: 36.3 10*3/uL — ABNORMAL HIGH (ref 1.7–7.7)
Neutrophils Relative %: 87 %
Platelets: 338 10*3/uL (ref 150–400)
RBC: 3.15 MIL/uL — ABNORMAL LOW (ref 4.22–5.81)
RDW: 14.8 % (ref 11.5–15.5)
WBC: 41.8 10*3/uL — ABNORMAL HIGH (ref 4.0–10.5)
nRBC: 0 % (ref 0.0–0.2)

## 2019-03-21 LAB — COMPREHENSIVE METABOLIC PANEL
ALT: 17 U/L (ref 0–44)
AST: 18 U/L (ref 15–41)
Albumin: 2.7 g/dL — ABNORMAL LOW (ref 3.5–5.0)
Alkaline Phosphatase: 94 U/L (ref 38–126)
Anion gap: 11 (ref 5–15)
BUN: 32 mg/dL — ABNORMAL HIGH (ref 8–23)
CO2: 21 mmol/L — ABNORMAL LOW (ref 22–32)
Calcium: 8.8 mg/dL — ABNORMAL LOW (ref 8.9–10.3)
Chloride: 101 mmol/L (ref 98–111)
Creatinine, Ser: 1.86 mg/dL — ABNORMAL HIGH (ref 0.61–1.24)
GFR calc Af Amer: 38 mL/min — ABNORMAL LOW (ref >60)
GFR calc non Af Amer: 33 mL/min — ABNORMAL LOW (ref >60)
Glucose, Bld: 134 mg/dL — ABNORMAL HIGH (ref 70–99)
Potassium: 4.3 mmol/L (ref 3.5–5.1)
Sodium: 133 mmol/L — ABNORMAL LOW (ref 135–145)
Total Bilirubin: 0.4 mg/dL (ref 0.3–1.2)
Total Protein: 6.9 g/dL (ref 6.5–8.1)

## 2019-03-21 LAB — TSH: TSH: 4.456 u[IU]/mL (ref 0.350–4.500)

## 2019-03-21 MED ORDER — OXYCODONE HCL 5 MG PO TABS: 5.0000 mg | ORAL_TABLET | ORAL | 0 refills | Status: DC | PRN

## 2019-03-21 MED ORDER — SODIUM CHLORIDE 0.9% FLUSH
10.0000 mL | Freq: Once | INTRAVENOUS | Status: AC
  Administered 2019-03-21: 10 mL via INTRAVENOUS
  Filled 2019-03-21: qty 10

## 2019-03-21 MED ORDER — SODIUM CHLORIDE 0.9 % IV SOLN
Freq: Once | INTRAVENOUS | Status: AC
  Administered 2019-03-21: 12:00:00 via INTRAVENOUS
  Filled 2019-03-21: qty 250

## 2019-03-21 MED ORDER — SODIUM CHLORIDE 0.9 % IV SOLN
1200.0000 mg | Freq: Once | INTRAVENOUS | Status: AC
  Administered 2019-03-21: 1200 mg via INTRAVENOUS
  Filled 2019-03-21: qty 20

## 2019-03-21 MED ORDER — HEPARIN SOD (PORK) LOCK FLUSH 100 UNIT/ML IV SOLN
500.0000 [IU] | Freq: Once | INTRAVENOUS | Status: AC | PRN
  Administered 2019-03-21: 500 [IU]
  Filled 2019-03-21: qty 5

## 2019-03-21 MED ORDER — SODIUM CHLORIDE 0.9 % IV SOLN
Freq: Once | INTRAVENOUS | Status: AC
  Administered 2019-03-21: 10:00:00 via INTRAVENOUS
  Filled 2019-03-21: qty 250

## 2019-03-21 MED ORDER — MORPHINE SULFATE 2 MG/ML IJ SOLN
5.0000 mg | Freq: Once | INTRAMUSCULAR | Status: AC
  Administered 2019-03-21: 5 mg via INTRAVENOUS
  Filled 2019-03-21: qty 3

## 2019-03-21 NOTE — Progress Notes (Signed)
Cr 1.86, ok to proceed per MD

## 2019-03-21 NOTE — Telephone Encounter (Signed)
Wife called reporting that patient was told Oxycodone refill would be sent to pharmacy and it has not been sent. Please advise

## 2019-03-21 NOTE — Progress Notes (Signed)
HR 102, ok to proceed per md 

## 2019-03-21 NOTE — Progress Notes (Signed)
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 For his prostate cancer he has received IM RT in the past. He has been seeing Dr. Erlene Quan in the past for his recurrent UTIs as well as hematuria. CT scan in July 2018 showed bladder wall thickening with perivascular edema and inflammation in the right ureter greater than the left. Findings were thought to be due to pyelonephritis at that time. He underwent cystoscopy on 12/14/2017 which showed abnormal looking prostate with necrotic material lining the entire surface area. Diffuse copious debris within the bladder appearing to be erythematous without discrete bladder tumor but visualization was poor. He was then admitted to the hospital on 12/26/2017 with symptoms of UTI and sepsis as well as new moderate bilateral hydronephrosis.  CT abdomen and pelvis with contrast on 12/20/2017 again showed market bile bladder wall thickening with perivesicular edema. Within the lumen of the bladder there is a 93.9 cm soft tissue attenuating filling defect. This is indeterminate and could represent an area of blood clot versus urothelial lesion.  He underwent repeat cystoscopy with bilateral pyelogram and ureteral stent placement as well as TURBT and TURP. He was found to have a massive tumor involving the majority of the bladder with grossly necrotic and calcified material. There appeared  to be multifocal disease with large burden of the left anterior bladder wall extending posteriorly as well as adjacent to the bladder neck and beyond the right hemitrigone. Very little normal recognizable bladder mucosa remaining.   Biopsy from TURBT and TURP showed: High-grade urothelial carcinoma with extensive necrosisinvolving both the bladder and the prostate.CT chest did not reveal any evidence of metastatic disease. He was also not found to have any regional adenopathy on CT abdomen  Patient was seen by Dr. Erlene Quan and was not deemed to be a surgical candidate. He has been referred to [place] for definitive treatment options.  Patient lives with his wife at home and ambulates with a cane. He does need assistance with his ADLs to some extent. He has not had any falls and he denies any changes in his appetite or unintentional weight loss. Denies any pain. Reports some fatigue and occasional problems with constipation. He has had 3 hospitalizations last year for urinary tract infections and currently has a chronic Foley for the last 1 month  Dr. Erlene Quan performed interval TURBT on 01/15/18 and was able to debulk tumor as much as possible to reduce tumor burden  Patient received 5 cycles ofcarboplatin/ gemcitabine 2 weeks on and 1 week off ending 04/26/18.Patient did receive radiation for 10 days during cycle 2 of treatment. Given patients age, co-morbidities and frailty- he was not a cisplatin candidate.6th cycle not given due to fall and hip fracture  Patient had a repeat cystoscopy in September 2019 which showed no obvious tumor bladder but it did have a shaggy necrotic appearance at the bladder neck. Prostatic fossa was grossly abnormal necrotic and irregular without papillary change. Bladder neck was biopsied and showed residual high-grade urothelial carcinoma.  Muscularis propria was not seen in that specimen.Due to evidence of recurrent/residual disease patient was started  on Tecentriq on 07/26/2018   Interval history- reports abdominal pain  ECOG PS- 2 Pain scale- 4 Opioid associated constipation- no  Review of systems- Review of Systems  Constitutional: Positive for malaise/fatigue. Negative for chills, fever and weight loss.  HENT: Negative for congestion, ear discharge and nosebleeds.   Eyes: Negative for blurred vision.  Respiratory: Negative for cough, hemoptysis, sputum production, shortness of breath and wheezing.   Cardiovascular: Negative for chest pain, palpitations, orthopnea and claudication.  Gastrointestinal: Positive for abdominal pain. Negative for blood in stool, constipation, diarrhea, heartburn, melena, nausea and vomiting.  Genitourinary: Negative for dysuria, flank pain, frequency, hematuria and urgency.  Musculoskeletal: Negative for back pain, joint pain and myalgias.  Skin: Negative for rash.  Neurological: Negative for dizziness, tingling, focal weakness, seizures, weakness and headaches.  Endo/Heme/Allergies: Does not bruise/bleed easily.  Psychiatric/Behavioral: Negative for depression and suicidal ideas. The patient does not have insomnia.       No Known Allergies   Past Medical History:  Diagnosis Date   Anxiety    Arthritis    Bladder cancer ([place])    Dysrhythmia 07/2018   history of atrial flutter that worsens with anxiety   Femur fracture, right ([place]) 05/01/2018   GERD (gastroesophageal reflux disease)    History of recent blood transfusion 05/2018   Hypertension    Iron deficiency anemia 09/14/2018   Prostate cancer ([place]) 07/2018   cancer growing in prostate but not prostate cancer, it is from the bladder   Umbilical hernia 41/9622   Urinary retention 2019   foley catheter place 11/2017   UTI (urinary tract infection) 2019   frequent UTI's over last year   Wound eschar of foot 07/2018   left heal getting wrapped and requiring antibiotic cream. cracks open with weight bearing.     Past  Surgical History:  Procedure Laterality Date   CARPAL TUNNEL RELEASE Right    CHOLECYSTECTOMY  2004   CYSTOGRAM  07/18/2018   Procedure: CYSTOGRAM;  Surgeon: [name], MD;  Location: [facility];  Service: Urology;;   CYSTOSCOPY W/ RETROGRADES Bilateral 07/18/2018   Procedure: CYSTOSCOPY WITH RETROGRADE PYELOGRAM;  Surgeon: [name], MD;  Location: [facility];  Service: Urology;  Laterality: Bilateral;   CYSTOSCOPY W/ URETERAL STENT PLACEMENT Bilateral 12/27/2017   Procedure: CYSTOSCOPY WITH RETROGRADE PYELOGRAM/URETERAL STENT PLACEMENT;  Surgeon: [name], MD;  Location: [facility];  Service: Urology;  Laterality: Bilateral;   CYSTOSCOPY W/ URETERAL STENT PLACEMENT Bilateral 07/18/2018   Procedure: CYSTOSCOPY WITH STENT REPLACEMENT (exchange);  Surgeon: [name], MD;  Location: [facility];  Service: Urology;  Laterality: Bilateral;   CYSTOSCOPY WITH STENT PLACEMENT Bilateral 11/12/2018   Procedure: [place] WITH STENT Exchange;  Surgeon: [name], MD;  Location: [facility];  Service: Urology;  Laterality: Bilateral;   INTRAMEDULLARY (IM) NAIL INTERTROCHANTERIC Right 05/02/2018   Procedure: INTRAMEDULLARY (IM) NAIL INTERTROCHANTRIC;  Surgeon: [name], MD;  Location: [facility];  Service: Orthopedics;  Laterality: Right;   IR NEPHROSTOMY PLACEMENT LEFT  02/23/2019   IR NEPHROSTOMY PLACEMENT RIGHT  02/23/2019   LEG TENDON SURGERY Right 1958   PORTA CATH INSERTION N/A 01/22/2018   Procedure: PORTA CATH INSERTION;  Surgeon: [name], MD;  Location: [place] [facility];  Service: Cardiovascular;  Laterality: N/A;   TRANSURETHRAL RESECTION OF BLADDER TUMOR N/A 12/27/2017   Procedure: TRANSURETHRAL RESECTION OF BLADDER TUMOR (TURBT);  Surgeon: [name],  MD;  Location: [facility];  Service: Urology;  Laterality: N/A;   TRANSURETHRAL RESECTION OF BLADDER TUMOR N/A 01/15/2018   Procedure: TRANSURETHRAL RESECTION OF BLADDER TUMOR (TURBT);  Surgeon: [name], MD;  Location: [facility];  Service: Urology;  Laterality: N/A;  Need 2 hrs for this case please   TRANSURETHRAL RESECTION OF BLADDER TUMOR N/A 07/18/2018   Procedure: TRANSURETHRAL RESECTION OF BLADDER TUMOR (TURBT);  Surgeon: [name], MD;  Location: [facility];  Service: Urology;  Laterality: N/A;    Social History   Socioeconomic History   Marital status: Married    Spouse name: ruth   Number of children: Not on file   Years of education: Not on file   Highest education level: Not on file  Occupational History   Occupation: retired    Comment: Therapist, nutritional [place] resource strain: Not on file   Food insecurity:    Worry: Not on file    Inability: Not on file   Transportation needs:    Medical: Not on file    Non-medical: Not on file  Tobacco Use   Smoking status: Never Smoker   Smokeless tobacco: Never Used  Substance and Sexual Activity   Alcohol use: No    Alcohol/week: 0.0 standard drinks   Drug use: No   Sexual activity: Not Currently  Lifestyle   Physical activity:    Days per week: Not on file    Minutes per session: Not on file   Stress: Not on file  Relationships   Social connections:    Talks on phone: Not on file    Gets together: Not on file    Attends religious service: Not on file    Active member of club or organization: Not on file    Attends meetings of clubs or organizations: Not on file    Relationship status: Not on file   Intimate partner violence:    Fear of current or ex partner: Not on file    Emotionally abused: Not on file    Physically abused: Not on file    Forced sexual activity: Not on file  Other Topics Concern   Not on file  Social History Narrative   Not on file    Family History  Problem Relation Age of Onset   Cancer Mother    Chronic Renal Failure Mother    Heart disease Father      Current Outpatient Medications:    acetaminophen (TYLENOL) 500 MG tablet, Take  1,000 mg by mouth every 6 (six) hours as needed for moderate pain or headache. , Disp: , Rfl:    cetirizine (ZYRTEC) 10 MG tablet, Take 10 mg by mouth daily as needed for allergies. , Disp: , Rfl:    Cranberry-Cholecalciferol (SUPER CRANBERRY/VITAMIN D3) 4200-500 MG-UNIT CAPS, Take 1 capsule by mouth 2 (two) times daily., Disp: , Rfl:    ELIQUIS 5 MG TABS tablet, TAKE ONE TABLET BY MOUTH TWICE DAILY (Patient taking differently: Take 5 mg by mouth 2 (two) times daily. ), Disp: 60 tablet, Rfl: 3   ferrous sulfate 325 (65 FE) MG tablet, Take 1 tablet (325 mg total) by mouth 2 (two) times daily with a meal., Disp: , Rfl: 3   lidocaine-prilocaine (EMLA) cream, Apply 1 application topically as needed., Disp: 30 g, Rfl: 1   Multiple Vitamin (MULTIVITAMIN WITH MINERALS) TABS tablet, Take 1 tablet by mouth daily., Disp: , Rfl:    MYRBETRIQ 50 MG TB24 tablet,  TAKE ONE TABLET EVERY DAY (Patient taking differently: Take 50 mg by mouth daily. ), Disp: 30 tablet, Rfl: 3   nystatin (NYAMYC) powder, Apply topically 2 (two) times daily., Disp: 60 g, Rfl: 3   oxyCODONE (OXY IR/ROXICODONE) 5 MG immediate release tablet, Take 1 tablet (5 mg total) by mouth every 4 (four) hours as needed for moderate pain or severe pain., Disp: 15 tablet, Rfl: 0   polyethylene glycol powder (MIRALAX) powder, Take 17 g by mouth daily as needed. Can increase to 3 times a day as needed for constipation but hold medication if has diarrhea, Disp: 255 g, Rfl: 0   potassium chloride SA (K-DUR) 20 MEQ tablet, Take 20 mEq by mouth 2 (two) times daily., Disp: , Rfl:    vitamin B-12 (CYANOCOBALAMIN) 500 MCG tablet, Take 500 mcg by mouth daily., Disp: , Rfl:    zinc oxide 20 % ointment, Apply 1 application topically as needed for irritation., Disp: , Rfl:   Physical exam:  Vitals:   03/21/19 1044  BP: 120/72  Pulse: (!) 102  Resp: 18  Temp: 97.8 F (36.6 C)  TempSrc: Tympanic   Physical Exam Constitutional:      Comments:  Appears frail fatigued and tremulous  HENT:     Head: Normocephalic and atraumatic.  Eyes:     Pupils: Pupils are equal, round, and reactive to light.  Neck:     Musculoskeletal: Normal range of motion.  Cardiovascular:     Rate and Rhythm: Regular rhythm. Tachycardia present.     Heart sounds: Normal heart sounds.  Pulmonary:     Effort: Pulmonary effort is normal.     Breath sounds: Normal breath sounds.  Abdominal:     General: Bowel sounds are normal.     Palpations: Abdomen is soft.     Comments: Bilateral nephrostomy tubes in place draining clear urine  Skin:    General: Skin is warm and dry.  Neurological:     Mental Status: He is alert and oriented to person, place, and time.      CMP Latest Ref Rng & Units 02/26/2019  Glucose 70 - 99 mg/dL 94  BUN 8 - 23 mg/dL 25(H)  Creatinine 0.61 - 1.24 mg/dL 2.26(H)  Sodium 135 - 145 mmol/L 138  Potassium 3.5 - 5.1 mmol/L 3.6  Chloride 98 - 111 mmol/L 108  CO2 22 - 32 mmol/L 21(L)  Calcium 8.9 - 10.3 mg/dL 8.1(L)  Total Protein 6.5 - 8.1 g/dL -  Total Bilirubin 0.3 - 1.2 mg/dL -  Alkaline Phos 38 - 126 U/L -  AST 15 - 41 U/L -  ALT 0 - 44 U/L -   CBC Latest Ref Rng & Units 03/21/2019  WBC 4.0 - 10.5 K/uL 41.8(H)  Hemoglobin 13.0 - 17.0 g/dL 9.6(L)  Hematocrit 39.0 - 52.0 % 29.5(L)  Platelets 150 - 400 K/uL 338    No images are attached to the encounter.  Ct Abdomen Pelvis Wo Contrast  Result Date: 02/22/2019 CLINICAL DATA:  History of DVT and urothelial carcinoma with bilateral hydronephrosis EXAM: CT ABDOMEN AND PELVIS WITHOUT CONTRAST TECHNIQUE: Multidetector CT imaging of the abdomen and pelvis was performed following the standard protocol without IV contrast. COMPARISON:  02/08/2019 FINDINGS: Lower chest: No acute abnormality. Hepatobiliary: No focal liver abnormality is seen. Status post cholecystectomy. No biliary dilatation. Pancreas: Unremarkable. No pancreatic ductal dilatation or surrounding inflammatory changes.  Spleen: Normal in size without focal abnormality. Adrenals/Urinary Tract: Adrenal glands are within normal limits bilaterally.  Chronic bilateral hydronephrosis is noted with ureteral stents in place. The bladder is decompressed by Foley catheter although mottled air and soft tissue density is noted within the bladder consistent with the patient's known mass lesion and possibly some underlying thrombus. Air is noted within the collecting systems bilaterally likely retrograde passage from the bladder. Right renal cyst is again noted. Stomach/Bowel: The appendix is within normal limits. No obstructive or inflammatory changes of large or small bowel are noted. The stomach is decompressed. Vascular/Lymphatic: Aortic atherosclerosis. No enlarged abdominal or pelvic lymph nodes. Reproductive: Prostate is unremarkable. Other: No abdominal wall hernia or abnormality. No abdominopelvic ascites. Musculoskeletal: Degenerative changes of the lumbar spine are noted. Postsurgical changes in the proximal right femur are seen. IMPRESSION: Bilateral ureteral stents in satisfactory position with evidence of stable chronic hydronephrosis bilaterally. Increased mottled density in the bladder is noted consistent with the underlying neoplasm. The possibility of some underlying thrombus deserves consideration as well. Correlation with urine is recommended. Foley catheter is seen in place. Chronic changes as described above. Electronically Signed   By: Inez Catalina M.D.   On: 02/22/2019 00:50   Dg Chest 2 View  Result Date: 02/21/2019 CLINICAL DATA:  Leukocytosis and hypertension EXAM: CHEST - 2 VIEW COMPARISON:  11/12/2018 FINDINGS: Calcified pleural plaques are noted bilaterally. Right jugular Port-A-Cath is stable with its tip at the lower SVC. Normal heart size. No pneumothorax or pleural effusion. No new consolidation or lung mass. IMPRESSION: No active cardiopulmonary disease. Electronically Signed   By: Marybelle Killings M.D.   On:  02/21/2019 15:51   [place] Intraoperative  Result Date: 02/23/2019 INDICATION: 84 year old male with a history of locally advanced bladder cancer and persistent hydroureteronephrosis and obstructed uropathy despite the presence of bilateral double-J ureteral stents. He presents with sepsis likely due to obstructed pyonephrosis. Bilateral percutaneous nephrostomy tubes are warranted. EXAM: IR NEPHROSTOMY PLACEMENT LEFT; ULTRASOUND INTRAOPERATIVE-NRPT [facility]; IR NEPHROSTOMY PLACEMENT RIGHT COMPARISON:  None. MEDICATIONS: Patient is an inpatient receiving Zosyn every 8 hours. No additional antibiotic prophylaxis was administered. ANESTHESIA/SEDATION: Fentanyl 75 mcg IV; Versed 1 mg IV Moderate Sedation Time:  19 minutes The patient was continuously monitored during the procedure by the interventional radiology nurse under my direct supervision. CONTRAST:  15 mL Omnipaque 300-administered into the collecting system(s) FLUOROSCOPY TIME:  Fluoroscopy Time: 1 minutes 24 seconds (23.1 mGy). COMPLICATIONS: None immediate. TECHNIQUE: The procedure, risks, benefits, and alternatives were explained to the patient. Questions regarding the procedure were encouraged and answered. The patient understands and consents to the procedure. The left flank was prepped with chlorhexidine in a sterile fashion, and a sterile drape was applied covering the operative field. A sterile gown and sterile gloves were used for the procedure. Local anesthesia was provided with 1% Lidocaine. The left flank was interrogated with ultrasound and the left kidney identified. The kidney is hydronephrotic. A suitable access site on the skin overlying the lower pole, posterior calix was identified. After local mg anesthesia was achieved, a small skin nick was made with an 11 blade scalpel. A 21 gauge Accustick needle was then advanced under direct sonographic guidance into the lower pole of the left kidney. A 0.018 inch wire was advanced under fluoroscopic  guidance into the left renal collecting system. The Accustick sheath was then advanced over the wire and a 0.018 system exchanged for a 0.035 system. Gentle hand injection of contrast material confirms placement of the sheath within the renal collecting system. There is purulent urine. Moderate hydronephrosis is present. The tract  from the scan into the renal collecting system was then dilated serially to 10-French. A 10-French Cook all-purpose drain was then placed and positioned under fluoroscopic guidance. The locking loop is well formed within the left renal pelvis. The catheter was secured to the skin with 2-0 Prolene and a sterile bandage was placed. Catheter was left to gravity bag drainage. The right flank was prepped with chlorhexidine in a sterile fashion, and a sterile drape was applied covering the operative field. A sterile gown and sterile gloves were used for the procedure. Local anesthesia was provided with 1% Lidocaine. The right flank was interrogated with ultrasound and the left kidney identified. The kidney is hydronephrotic. A suitable access site on the skin overlying the lower pole, posterior calix was identified. After local mg anesthesia was achieved, a small skin nick was made with an 11 blade scalpel. A 21 gauge Accustick needle was then advanced under direct sonographic guidance into the lower pole of the right kidney. A 0.018 inch wire was advanced under fluoroscopic guidance into the left renal collecting system. The Accustick sheath was then advanced over the wire and a 0.018 system exchanged for a 0.035 system. Gentle hand injection of contrast material confirms placement of the sheath within the renal collecting system. There is moderate hydronephrosis. The tract from the scan into the renal collecting system was then dilated serially to 10-French. A 10-French Cook all-purpose drain was then placed and positioned under fluoroscopic guidance. The locking loop is well formed within the  left renal pelvis. The catheter was secured to the skin with 2-0 Prolene and a sterile bandage was placed. Catheter was left to gravity bag drainage. IMPRESSION: Successful placement of a bilateral 10 French percutaneous nephrostomy tubes. Of note, there was purulence within the urine at the time of puncture on the left suggesting that the left kidney is the source of infection. No definite purulence appreciated in the right renal collecting system. Signed, Criselda Peaches, MD, [facility] Electronically Signed   By: Jacqulynn Cadet M.D.   On: 02/23/2019 13:30   Ir Nephrostomy Placement Left  Result Date: 02/23/2019 INDICATION: 84 year old male with a history of locally advanced bladder cancer and persistent hydroureteronephrosis and obstructed uropathy despite the presence of bilateral double-J ureteral stents. He presents with sepsis likely due to obstructed pyonephrosis. Bilateral percutaneous nephrostomy tubes are warranted. EXAM: IR NEPHROSTOMY PLACEMENT LEFT; ULTRASOUND INTRAOPERATIVE-NRPT [facility]; IR NEPHROSTOMY PLACEMENT RIGHT COMPARISON:  None. MEDICATIONS: Patient is an inpatient receiving Zosyn every 8 hours. No additional antibiotic prophylaxis was administered. ANESTHESIA/SEDATION: Fentanyl 75 mcg IV; Versed 1 mg IV Moderate Sedation Time:  19 minutes The patient was continuously monitored during the procedure by the interventional radiology nurse under my direct supervision. CONTRAST:  15 mL Omnipaque 300-administered into the collecting system(s) FLUOROSCOPY TIME:  Fluoroscopy Time: 1 minutes 24 seconds (23.1 mGy). COMPLICATIONS: None immediate. TECHNIQUE: The procedure, risks, benefits, and alternatives were explained to the patient. Questions regarding the procedure were encouraged and answered. The patient understands and consents to the procedure. The left flank was prepped with chlorhexidine in a sterile fashion, and a sterile  drape was applied covering the operative field. A sterile gown and sterile gloves were used for the procedure. Local anesthesia was provided with 1% Lidocaine. The left flank was interrogated with ultrasound and the left kidney identified. The kidney is hydronephrotic. A suitable access site on the skin overlying the lower pole, posterior calix was identified. After local mg anesthesia was  achieved, a small skin nick was made with an 11 blade scalpel. A 21 gauge Accustick needle was then advanced under direct sonographic guidance into the lower pole of the left kidney. A 0.018 inch wire was advanced under fluoroscopic guidance into the left renal collecting system. The Accustick sheath was then advanced over the wire and a 0.018 system exchanged for a 0.035 system. Gentle hand injection of contrast material confirms placement of the sheath within the renal collecting system. There is purulent urine. Moderate hydronephrosis is present. The tract from the scan into the renal collecting system was then dilated serially to 10-French. A 10-French Cook all-purpose drain was then placed and positioned under fluoroscopic guidance. The locking loop is well formed within the left renal pelvis. The catheter was secured to the skin with 2-0 Prolene and a sterile bandage was placed. Catheter was left to gravity bag drainage. The right flank was prepped with chlorhexidine in a sterile fashion, and a sterile drape was applied covering the operative field. A sterile gown and sterile gloves were used for the procedure. Local anesthesia was provided with 1% Lidocaine. The right flank was interrogated with ultrasound and the left kidney identified. The kidney is hydronephrotic. A suitable access site on the skin overlying the lower pole, posterior calix was identified. After local mg anesthesia was achieved, a small skin nick was made with an 11 blade scalpel. A 21 gauge Accustick needle was then advanced under direct sonographic  guidance into the lower pole of the right kidney. A 0.018 inch wire was advanced under fluoroscopic guidance into the left renal collecting system. The Accustick sheath was then advanced over the wire and a 0.018 system exchanged for a 0.035 system. Gentle hand injection of contrast material confirms placement of the sheath within the renal collecting system. There is moderate hydronephrosis. The tract from the scan into the renal collecting system was then dilated serially to 10-French. A 10-French Cook all-purpose drain was then placed and positioned under fluoroscopic guidance. The locking loop is well formed within the left renal pelvis. The catheter was secured to the skin with 2-0 Prolene and a sterile bandage was placed. Catheter was left to gravity bag drainage. IMPRESSION: Successful placement of a bilateral 10 French percutaneous nephrostomy tubes. Of note, there was purulence within the urine at the time of puncture on the left suggesting that the left kidney is the source of infection. No definite purulence appreciated in the right renal collecting system. Signed, Criselda Peaches, MD, [facility] Radiology Electronically Signed   By: Jacqulynn Cadet M.D.   On: 02/23/2019 13:30   Ir Nephrostomy Placement Right  Result Date: 02/23/2019 INDICATION: 84 year old male with a history of locally advanced bladder cancer and persistent hydroureteronephrosis and obstructed uropathy despite the presence of bilateral double-J ureteral stents. He presents with sepsis likely due to obstructed pyonephrosis. Bilateral percutaneous nephrostomy tubes are warranted. EXAM: IR NEPHROSTOMY PLACEMENT LEFT; ULTRASOUND INTRAOPERATIVE-NRPT [facility]; IR NEPHROSTOMY PLACEMENT RIGHT COMPARISON:  None. MEDICATIONS: Patient is an inpatient receiving Zosyn every 8 hours. No additional antibiotic prophylaxis was administered. ANESTHESIA/SEDATION: Fentanyl 75 mcg IV; Versed 1 mg  IV Moderate Sedation Time:  19 minutes The patient was continuously monitored during the procedure by the interventional radiology nurse under my direct supervision. CONTRAST:  15 mL Omnipaque 300-administered into the collecting system(s) FLUOROSCOPY TIME:  Fluoroscopy Time: 1 minutes 24 seconds (23.1 mGy). COMPLICATIONS: None immediate. TECHNIQUE: The procedure, risks, benefits, and alternatives were explained to the patient.  Questions regarding the procedure were encouraged and answered. The patient understands and consents to the procedure. The left flank was prepped with chlorhexidine in a sterile fashion, and a sterile drape was applied covering the operative field. A sterile gown and sterile gloves were used for the procedure. Local anesthesia was provided with 1% Lidocaine. The left flank was interrogated with ultrasound and the left kidney identified. The kidney is hydronephrotic. A suitable access site on the skin overlying the lower pole, posterior calix was identified. After local mg anesthesia was achieved, a small skin nick was made with an 11 blade scalpel. A 21 gauge Accustick needle was then advanced under direct sonographic guidance into the lower pole of the left kidney. A 0.018 inch wire was advanced under fluoroscopic guidance into the left renal collecting system. The Accustick sheath was then advanced over the wire and a 0.018 system exchanged for a 0.035 system. Gentle hand injection of contrast material confirms placement of the sheath within the renal collecting system. There is purulent urine. Moderate hydronephrosis is present. The tract from the scan into the renal collecting system was then dilated serially to 10-French. A 10-French Cook all-purpose drain was then placed and positioned under fluoroscopic guidance. The locking loop is well formed within the left renal pelvis. The catheter was secured to the skin with 2-0 Prolene and a sterile bandage was placed. Catheter was left to  gravity bag drainage. The right flank was prepped with chlorhexidine in a sterile fashion, and a sterile drape was applied covering the operative field. A sterile gown and sterile gloves were used for the procedure. Local anesthesia was provided with 1% Lidocaine. The right flank was interrogated with ultrasound and the left kidney identified. The kidney is hydronephrotic. A suitable access site on the skin overlying the lower pole, posterior calix was identified. After local mg anesthesia was achieved, a small skin nick was made with an 11 blade scalpel. A 21 gauge Accustick needle was then advanced under direct sonographic guidance into the lower pole of the right kidney. A 0.018 inch wire was advanced under fluoroscopic guidance into the left renal collecting system. The Accustick sheath was then advanced over the wire and a 0.018 system exchanged for a 0.035 system. Gentle hand injection of contrast material confirms placement of the sheath within the renal collecting system. There is moderate hydronephrosis. The tract from the scan into the renal collecting system was then dilated serially to 10-French. A 10-French Cook all-purpose drain was then placed and positioned under fluoroscopic guidance. The locking loop is well formed within the left renal pelvis. The catheter was secured to the skin with 2-0 Prolene and a sterile bandage was placed. Catheter was left to gravity bag drainage. IMPRESSION: Successful placement of a bilateral 10 French percutaneous nephrostomy tubes. Of note, there was purulence within the urine at the time of puncture on the left suggesting that the left kidney is the source of infection. No definite purulence appreciated in the right renal collecting system. Signed, Criselda Peaches, MD, [place] [facility] Radiology Electronically Signed   By: Jacqulynn Cadet M.D.   On: 02/23/2019 13:30     Assessment and plan- Patient is a 84  y.o. 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  After placement of  bilateral nephrostomy tubes his creatinine has come down to 1.8.  White count however remains elevated at 41 it is unclear if it is tumor associated leukocytosis versus an ongoing infection.  Patient reports some mild abdominal pain but denies complaints of fever.  His urinalysis rechecked is always positive.  He was recently hospitalized and received antibiotics while in the hospital.  I will hold off on giving him any further antibiotics at this time.  2.  I spoke to his wife and discussed his condition in detail.  CT chest abdomen and pelvis does show persistent tumor in his bladder but there is no evidence of metastatic disease.  Patient has been on Tecentriq now for almost 6 months and we have not seen any overt disease progression outside of his bladder.  I am therefore hesitant to change his treatment purely for leukocytosis.  Also patient is too frail to receive any chemotherapy at this time.  I will therefore proceed with Tecentriq today and see him back in 3 weeks time for cycle 10.  If there is poor progression of his bladder cancer with evidence of metastatic disease, he would be appropriately a candidate for hospice at that time  3.  I also advised the wife to keep an eye on his symptoms and if he has any symptoms including all but not limited to altered mental status, fever, worsening abdominal pain she will let us know and I will have a low threshold to start him on antibiotics.  I have prescribed him PRN oxycodone 5 mg every 4 hours as needed for his abdominal pain which may be secondary to his bladder cancer  4.  We will also give him 1 L of IV fluids today  5.  Return to clinic in 3 weeks.   Labs: CBC with differential, CMP.  See covering provider gets Tecentriq.  I will see him back in 6 weeks.    Visit Diagnosis 1. Encounter for antineoplastic immunotherapy   2. Leukocytosis, unspecified type   3. Malignant neoplasm of urinary bladder, unspecified site ([place])   4. AKI (acute kidney injury) ([place])      Dr. Randa Evens, MD, MPH [facility] [phone] 03/21/2019 2:18 PM

## 2019-03-21 NOTE — Progress Notes (Signed)
Pt having lots of pain lower abdomen to pelvis area. He has been taking tylenol but not helping. His left tube can be stopped up-hit has been draining just fine but now it is hardly draining. Wife thinks it needs to be flushed. I called and spoke to IR alyson and she told me how to flush it. I took his dressing off on left side nephrostomy tube and flushed with NS after I made sure the tubing was straight. It flushed fine. The tube was flowing good after that. Redressed the site with 4X4 gauze and teagderm dressing over the site and called wife to let her know it had been done and it draining good now

## 2019-03-22 ENCOUNTER — Inpatient Hospital Stay
Admission: EM | Admit: 2019-03-22 | Discharge: 2019-03-26 | DRG: 699 | Disposition: A | Discharge status: Home / Self Care | Payer: Medicare Other | Attending: Internal Medicine | Admitting: Internal Medicine

## 2019-03-22 ENCOUNTER — Other Ambulatory Visit: Payer: Self-pay

## 2019-03-22 ENCOUNTER — Telehealth: Payer: Self-pay | Admitting: *Deleted

## 2019-03-22 ENCOUNTER — Emergency Department: Payer: Medicare Other

## 2019-03-22 DIAGNOSIS — D72829 Elevated white blood cell count, unspecified: Secondary | ICD-10-CM | POA: Diagnosis present

## 2019-03-22 DIAGNOSIS — I129 Hypertensive chronic kidney disease with stage 1 through stage 4 chronic kidney disease, or unspecified chronic kidney disease: Secondary | ICD-10-CM | POA: Diagnosis present

## 2019-03-22 DIAGNOSIS — L8990 Pressure ulcer of unspecified site, unspecified stage: Secondary | ICD-10-CM

## 2019-03-22 DIAGNOSIS — Z9221 Personal history of antineoplastic chemotherapy: Secondary | ICD-10-CM

## 2019-03-22 DIAGNOSIS — N139 Obstructive and reflux uropathy, unspecified: Secondary | ICD-10-CM | POA: Diagnosis present

## 2019-03-22 DIAGNOSIS — Z79899 Other long term (current) drug therapy: Secondary | ICD-10-CM

## 2019-03-22 DIAGNOSIS — D638 Anemia in other chronic diseases classified elsewhere: Secondary | ICD-10-CM | POA: Diagnosis present

## 2019-03-22 DIAGNOSIS — Z87440 Personal history of urinary (tract) infections: Secondary | ICD-10-CM

## 2019-03-22 DIAGNOSIS — D509 Iron deficiency anemia, unspecified: Secondary | ICD-10-CM | POA: Diagnosis present

## 2019-03-22 DIAGNOSIS — Z842 Family history of other diseases of the genitourinary system: Secondary | ICD-10-CM

## 2019-03-22 DIAGNOSIS — Z9049 Acquired absence of other specified parts of digestive tract: Secondary | ICD-10-CM

## 2019-03-22 DIAGNOSIS — M1990 Unspecified osteoarthritis, unspecified site: Secondary | ICD-10-CM | POA: Diagnosis present

## 2019-03-22 DIAGNOSIS — K219 Gastro-esophageal reflux disease without esophagitis: Secondary | ICD-10-CM | POA: Diagnosis present

## 2019-03-22 DIAGNOSIS — Z515 Encounter for palliative care: Secondary | ICD-10-CM | POA: Diagnosis present

## 2019-03-22 DIAGNOSIS — N184 Chronic kidney disease, stage 4 (severe): Secondary | ICD-10-CM | POA: Diagnosis present

## 2019-03-22 DIAGNOSIS — T83012A Breakdown (mechanical) of nephrostomy catheter, initial encounter: Principal | ICD-10-CM | POA: Diagnosis present

## 2019-03-22 DIAGNOSIS — N136 Pyonephrosis: Secondary | ICD-10-CM | POA: Diagnosis present

## 2019-03-22 DIAGNOSIS — I10 Essential (primary) hypertension: Secondary | ICD-10-CM | POA: Diagnosis present

## 2019-03-22 DIAGNOSIS — Z7901 Long term (current) use of anticoagulants: Secondary | ICD-10-CM

## 2019-03-22 DIAGNOSIS — N390 Urinary tract infection, site not specified: Secondary | ICD-10-CM | POA: Diagnosis not present

## 2019-03-22 DIAGNOSIS — Z923 Personal history of irradiation: Secondary | ICD-10-CM

## 2019-03-22 DIAGNOSIS — N1330 Unspecified hydronephrosis: Secondary | ICD-10-CM

## 2019-03-22 DIAGNOSIS — R6510 Systemic inflammatory response syndrome (SIRS) of non-infectious origin without acute organ dysfunction: Secondary | ICD-10-CM | POA: Diagnosis present

## 2019-03-22 DIAGNOSIS — I4892 Unspecified atrial flutter: Secondary | ICD-10-CM | POA: Diagnosis present

## 2019-03-22 DIAGNOSIS — C679 Malignant neoplasm of bladder, unspecified: Secondary | ICD-10-CM | POA: Diagnosis present

## 2019-03-22 DIAGNOSIS — Y732 Prosthetic and other implants, materials and accessory gastroenterology and urology devices associated with adverse incidents: Secondary | ICD-10-CM | POA: Diagnosis present

## 2019-03-22 DIAGNOSIS — Z1159 Encounter for screening for other viral diseases: Secondary | ICD-10-CM

## 2019-03-22 DIAGNOSIS — B9689 Other specified bacterial agents as the cause of diseases classified elsewhere: Secondary | ICD-10-CM | POA: Diagnosis present

## 2019-03-22 DIAGNOSIS — N3289 Other specified disorders of bladder: Secondary | ICD-10-CM | POA: Diagnosis present

## 2019-03-22 DIAGNOSIS — F419 Anxiety disorder, unspecified: Secondary | ICD-10-CM | POA: Diagnosis present

## 2019-03-22 LAB — CBC WITH DIFFERENTIAL/PLATELET
Abs Immature Granulocytes: 0.85 10*3/uL — ABNORMAL HIGH (ref 0.00–0.07)
Basophils Absolute: 0.2 10*3/uL — ABNORMAL HIGH (ref 0.0–0.1)
Basophils Relative: 0 %
Eosinophils Absolute: 0.6 10*3/uL — ABNORMAL HIGH (ref 0.0–0.5)
Eosinophils Relative: 2 %
HCT: 27.1 % — ABNORMAL LOW (ref 39.0–52.0)
Hemoglobin: 8.7 g/dL — ABNORMAL LOW (ref 13.0–17.0)
Immature Granulocytes: 2 %
Lymphocytes Relative: 6 %
Lymphs Abs: 2.1 10*3/uL (ref 0.7–4.0)
MCH: 30.7 pg (ref 26.0–34.0)
MCHC: 32.1 g/dL (ref 30.0–36.0)
MCV: 95.8 fL (ref 80.0–100.0)
Monocytes Absolute: 2.5 10*3/uL — ABNORMAL HIGH (ref 0.1–1.0)
Monocytes Relative: 7 %
Neutro Abs: 30.8 10*3/uL — ABNORMAL HIGH (ref 1.7–7.7)
Neutrophils Relative %: 83 %
Platelets: 355 10*3/uL (ref 150–400)
RBC: 2.83 MIL/uL — ABNORMAL LOW (ref 4.22–5.81)
RDW: 14.9 % (ref 11.5–15.5)
WBC: 36.8 10*3/uL — ABNORMAL HIGH (ref 4.0–10.5)
nRBC: 0 % (ref 0.0–0.2)

## 2019-03-22 LAB — COMPREHENSIVE METABOLIC PANEL
ALT: 22 U/L (ref 0–44)
AST: 23 U/L (ref 15–41)
Albumin: 2.5 g/dL — ABNORMAL LOW (ref 3.5–5.0)
Alkaline Phosphatase: 92 U/L (ref 38–126)
Anion gap: 10 (ref 5–15)
BUN: 32 mg/dL — ABNORMAL HIGH (ref 8–23)
CO2: 21 mmol/L — ABNORMAL LOW (ref 22–32)
Calcium: 8.5 mg/dL — ABNORMAL LOW (ref 8.9–10.3)
Chloride: 99 mmol/L (ref 98–111)
Creatinine, Ser: 2.07 mg/dL — ABNORMAL HIGH (ref 0.61–1.24)
GFR calc Af Amer: 34 mL/min — ABNORMAL LOW (ref >60)
GFR calc non Af Amer: 29 mL/min — ABNORMAL LOW (ref >60)
Glucose, Bld: 114 mg/dL — ABNORMAL HIGH (ref 70–99)
Potassium: 4.1 mmol/L (ref 3.5–5.1)
Sodium: 130 mmol/L — ABNORMAL LOW (ref 135–145)
Total Bilirubin: 0.6 mg/dL (ref 0.3–1.2)
Total Protein: 6.8 g/dL (ref 6.5–8.1)

## 2019-03-22 LAB — URINALYSIS, COMPLETE (UACMP) WITH MICROSCOPIC
Bilirubin Urine: NEGATIVE
Bilirubin Urine: NEGATIVE
Glucose, UA: NEGATIVE mg/dL
Glucose, UA: NEGATIVE mg/dL
Ketones, ur: NEGATIVE mg/dL
Ketones, ur: NEGATIVE mg/dL
Nitrite: NEGATIVE
Nitrite: NEGATIVE
Protein, ur: 30 mg/dL — AB
Protein, ur: 30 mg/dL — AB
RBC / HPF: >50 RBC/hpf — ABNORMAL HIGH (ref 0–5)
Specific Gravity, Urine: 1.011 (ref 1.005–1.030)
Specific Gravity, Urine: 1.009 (ref 1.005–1.030)
WBC, UA: >50 WBC/hpf — ABNORMAL HIGH (ref 0–5)
pH: 5 (ref 5.0–8.0)
pH: 5 (ref 5.0–8.0)

## 2019-03-22 LAB — PROTIME-INR
INR: 1.9 — ABNORMAL HIGH (ref 0.8–1.2)
Prothrombin Time: 21.5 seconds — ABNORMAL HIGH (ref 11.4–15.2)

## 2019-03-22 LAB — SARS CORONAVIRUS 2 BY RT PCR (HOSPITAL ORDER, PERFORMED IN ~~LOC~~ HOSPITAL LAB): SARS Coronavirus 2: NEGATIVE

## 2019-03-22 LAB — LACTIC ACID, PLASMA: Lactic Acid, Venous: 1.7 mmol/L (ref 0.5–1.9)

## 2019-03-22 MED ORDER — ONDANSETRON HCL 4 MG PO TABS: 4.0000 mg | ORAL_TABLET | Freq: Four times a day (QID) | ORAL | Status: DC | PRN

## 2019-03-22 MED ORDER — PIPERACILLIN-TAZOBACTAM 3.375 G IVPB 30 MIN
3.3750 g | Freq: Once | INTRAVENOUS | Status: AC
  Administered 2019-03-22: 3.375 g via INTRAVENOUS
  Filled 2019-03-22: qty 50

## 2019-03-22 MED ORDER — APIXABAN 5 MG PO TABS
5.0000 mg | ORAL_TABLET | Freq: Two times a day (BID) | ORAL | Status: DC
  Administered 2019-03-23: 5 mg via ORAL
  Filled 2019-03-22: qty 1

## 2019-03-22 MED ORDER — PIPERACILLIN-TAZOBACTAM 3.375 G IVPB
3.3750 g | Freq: Three times a day (TID) | INTRAVENOUS | Status: DC
  Administered 2019-03-23 – 2019-03-25 (×7): 3.375 g via INTRAVENOUS
  Filled 2019-03-22 (×7): qty 50

## 2019-03-22 MED ORDER — ACETAMINOPHEN 325 MG PO TABS: 650.0000 mg | ORAL_TABLET | Freq: Four times a day (QID) | ORAL | Status: DC | PRN

## 2019-03-22 MED ORDER — ONDANSETRON HCL 4 MG/2ML IJ SOLN: 4.0000 mg | Freq: Four times a day (QID) | INTRAMUSCULAR | Status: DC | PRN

## 2019-03-22 MED ORDER — ACETAMINOPHEN 650 MG RE SUPP: 650.0000 mg | Freq: Four times a day (QID) | RECTAL | Status: DC | PRN

## 2019-03-22 MED ORDER — OXYCODONE HCL 5 MG PO TABS
5.0000 mg | ORAL_TABLET | ORAL | Status: DC | PRN
  Administered 2019-03-23 – 2019-03-25 (×9): 5 mg via ORAL
  Filled 2019-03-22 (×10): qty 1

## 2019-03-22 MED ORDER — SODIUM CHLORIDE 0.9 % IV BOLUS
500.0000 mL | Freq: Once | INTRAVENOUS | Status: AC
  Administered 2019-03-22: 500 mL via INTRAVENOUS

## 2019-03-22 MED ORDER — SODIUM CHLORIDE 0.9 % IV SOLN
INTRAVENOUS | Status: AC
  Administered 2019-03-23: via INTRAVENOUS

## 2019-03-22 NOTE — Consult Note (Signed)
Pharmacy Antibiotic Note  Vincent Poole is a 84 y.o. male admitted on 0/15/6153 with complicated UTI.  Pharmacy has been consulted for Zosyn dosing.  Plan: Start Zosyn 3.375 IV EI every 8 hours   Height: 5\' 11"  (180.3 cm) Weight: 170 lb (77.1 kg) IBW/kg (Calculated) : 75.3  Temp (24hrs), Avg:99.6 F (37.6 C), Min:99.6 F (37.6 C), Max:99.6 F (37.6 C)  Recent Labs  Lab 03/21/19 0913 03/22/19 1731  WBC 41.8* 36.8*  CREATININE 1.86* 2.07*  LATICACIDVEN  --  1.7    Estimated Creatinine Clearance: 29.8 mL/min (A) (by C-G formula based on SCr of 2.07 mg/dL (H)).    No Known Allergies  Antimicrobials this admission: 5/15 Zosyn >>   Microbiology results: 5/15 BCx: pending 5/15 UCx: pending 5/15 SARS Coronavirus 2  Thank you for allowing pharmacy to be a part of this patient's care.  Pernell Dupre, PharmD, BCPS Clinical Pharmacist 03/22/2019 9:56 PM

## 2019-03-22 NOTE — ED Notes (Signed)
ED TO INPATIENT HANDOFF REPORT  ED Nurse Name and Phone #: Lelon Frohlich [phone]  S Name/Age/Gender [name] 84 y.o. male Room/Bed: ED25A/ED25A  Code Status   Code Status: Prior  Home/SNF/Other Home Patient oriented to: self, place, time and situation Is this baseline? Yes   Triage Complete: Triage complete  Chief Complaint Catheter problems  Triage Note Pt arrives via EMS from home after his left kidney catheter had not drained in a couple days- family states it flushes okay but will not drain- EMS states the pt house was dirty and the pt was pulling bugs off of himself when they arrived   Allergies No Known Allergies  Level of Care/Admitting Diagnosis ED Disposition    ED Disposition Condition [place]: [facility] [100120]  Level of Care: Med-Surg [16]  Covid Evaluation: Screening Protocol (No Symptoms)  Diagnosis: SIRS (systemic inflammatory response syndrome) [facility]) [[id number]]  Admitting Physician: Lance Coon [[id number]]  Attending Physician: Lance Coon [[id number]]  PT Class (Do Not Modify): Observation [104]  PT Acc Code (Do Not Modify): Observation [10022]       B Medical/Surgery History Past Medical History:  Diagnosis Date  . Anxiety   . Arthritis   . Bladder cancer ([place])   . Dysrhythmia 07/2018   history of atrial flutter that worsens with anxiety  . Femur fracture, right ([place]) 05/01/2018  . GERD (gastroesophageal reflux disease)   . History of recent blood transfusion 05/2018  . Hypertension   . Iron deficiency anemia 09/14/2018  . Prostate cancer ([place]) 07/2018   cancer growing in prostate but not prostate cancer, it is from the bladder  . Umbilical hernia 01/5464  . Urinary retention 2019   foley catheter place 11/2017  . UTI (urinary tract infection) 2019   frequent UTI's over last year  . Wound eschar of foot 07/2018   left heal getting wrapped and requiring antibiotic cream. cracks open with  weight bearing.   Past Surgical History:  Procedure Laterality Date  . CARPAL TUNNEL RELEASE Right   . CHOLECYSTECTOMY  2004  . CYSTOGRAM  07/18/2018   Procedure: CYSTOGRAM;  Surgeon: [name], MD;  Location: [facility];  Service: Urology;;  . Consuela Mimes W/ RETROGRADES Bilateral 07/18/2018   Procedure: CYSTOSCOPY WITH RETROGRADE PYELOGRAM;  Surgeon: [name], MD;  Location: [facility];  Service: Urology;  Laterality: Bilateral;  . CYSTOSCOPY W/ URETERAL STENT PLACEMENT Bilateral 12/27/2017   Procedure: CYSTOSCOPY WITH RETROGRADE PYELOGRAM/URETERAL STENT PLACEMENT;  Surgeon: [name], MD;  Location: [facility];  Service: Urology;  Laterality: Bilateral;  . CYSTOSCOPY W/ URETERAL STENT PLACEMENT Bilateral 07/18/2018   Procedure: CYSTOSCOPY WITH STENT REPLACEMENT (exchange);  Surgeon: [name], MD;  Location: [facility];  Service: Urology;  Laterality: Bilateral;  . CYSTOSCOPY WITH STENT PLACEMENT Bilateral 11/12/2018   Procedure: [place] WITH STENT Exchange;  Surgeon: [name], MD;  Location: [facility];  Service: Urology;  Laterality: Bilateral;  . INTRAMEDULLARY (IM) NAIL INTERTROCHANTERIC Right 05/02/2018   Procedure: INTRAMEDULLARY (IM) NAIL INTERTROCHANTRIC;  Surgeon: [name], MD;  Location: [facility];  Service: Orthopedics;  Laterality: Right;  . IR NEPHROSTOMY PLACEMENT LEFT  02/23/2019  . IR NEPHROSTOMY PLACEMENT RIGHT  02/23/2019  . LEG TENDON SURGERY Right 1958  . PORTA CATH INSERTION N/A 01/22/2018   Procedure: PORTA CATH INSERTION;  Surgeon: [name], MD;  Location: [facility];  Service: Cardiovascular;  Laterality: N/A;  . TRANSURETHRAL RESECTION OF BLADDER TUMOR N/A 12/27/2017  Procedure: TRANSURETHRAL RESECTION OF BLADDER TUMOR (TURBT);  Surgeon: [name], MD;  Location: [facility];  Service: Urology;  Laterality: N/A;  . TRANSURETHRAL RESECTION OF BLADDER TUMOR N/A 01/15/2018   Procedure: TRANSURETHRAL RESECTION OF BLADDER TUMOR  (TURBT);  Surgeon: [name], MD;  Location: [facility];  Service: Urology;  Laterality: N/A;  Need 2 hrs for this case please  . TRANSURETHRAL RESECTION OF BLADDER TUMOR N/A 07/18/2018   Procedure: TRANSURETHRAL RESECTION OF BLADDER TUMOR (TURBT);  Surgeon: [name], MD;  Location: [facility];  Service: Urology;  Laterality: N/A;     A IV Location/Drains/Wounds Patient Lines/Drains/Airways Status   Active Line/Drains/Airways    Name:   Placement date:   Placement time:   Site:   Days:   Implanted Port 03/21/19   03/21/19    0916    -   1   Peripheral IV 03/22/19 Left Forearm   03/22/19    1733    Forearm   less than 1   Nephrostomy Left 10.2 Fr.   02/23/19    1302    Left   27   Nephrostomy Right 10.2 Fr.   02/23/19    1303    Right   27   Ureteral Drain/Stent Left ureter 6 Fr.   12/27/17    1141    Left ureter   450   Ureteral Drain/Stent Right ureter 6 Fr.   12/27/17    1142    Right ureter   450   Ureteral Drain/Stent Left ureter 6 Fr.   11/12/18    1355    Left ureter   130   Ureteral Drain/Stent Right ureter 6 Fr.   11/12/18    1356    Right ureter   130          Intake/Output Last 24 hours  Intake/Output Summary (Last 24 hours) at 03/22/2019 2210 Last data filed at 03/22/2019 2104 Gross per 24 hour  Intake 540.61 ml  Output 100 ml  Net 440.61 ml    Labs/Imaging Results for orders placed or performed during the hospital encounter of 03/22/19 (from the past 48 hour(s))  Comprehensive metabolic panel     Status: Abnormal   Collection Time: 03/22/19  5:31 PM  Result Value Ref Range   Sodium 130 (L) 135 - 145 mmol/L   Potassium 4.1 3.5 - 5.1 mmol/L   Chloride 99 98 - 111 mmol/L   CO2 21 (L) 22 - 32 mmol/L   Glucose, Bld 114 (H) 70 - 99 mg/dL   BUN 32 (H) 8 - 23 mg/dL   Creatinine, Ser 2.07 (H) 0.61 - 1.24 mg/dL   Calcium 8.5 (L) 8.9 - 10.3 mg/dL   Total Protein 6.8 6.5 - 8.1 g/dL   Albumin 2.5 (L) 3.5 - 5.0 g/dL   AST 23 15 - 41 U/L   ALT 22 0 - 44 U/L    Alkaline Phosphatase 92 38 - 126 U/L   Total Bilirubin 0.6 0.3 - 1.2 mg/dL   GFR calc non Af Amer 29 (L) >60 mL/min   GFR calc Af Amer 34 (L) >60 mL/min   Anion gap 10 5 - 15    Comment: Performed at [facility], [address] [place], [place] [address]  Lactic acid, plasma     Status: None   Collection Time: 03/22/19  5:31 PM  Result Value Ref Range   Lactic Acid, Venous 1.7 0.5 - 1.9 mmol/L    Comment: Performed at  [facility], [address] [place], [place] [address]  CBC with Differential     Status: Abnormal   Collection Time: 03/22/19  5:31 PM  Result Value Ref Range   WBC 36.8 (H) 4.0 - 10.5 K/uL   RBC 2.83 (L) 4.22 - 5.81 MIL/uL   Hemoglobin 8.7 (L) 13.0 - 17.0 g/dL   HCT 27.1 (L) 39.0 - 52.0 %   MCV 95.8 80.0 - 100.0 fL   MCH 30.7 26.0 - 34.0 pg   MCHC 32.1 30.0 - 36.0 g/dL   RDW 14.9 11.5 - 15.5 %   Platelets 355 150 - 400 K/uL   nRBC 0.0 0.0 - 0.2 %   Neutrophils Relative % 83 %   Neutro Abs 30.8 (H) 1.7 - 7.7 K/uL   Lymphocytes Relative 6 %   Lymphs Abs 2.1 0.7 - 4.0 K/uL   Monocytes Relative 7 %   Monocytes Absolute 2.5 (H) 0.1 - 1.0 K/uL   Eosinophils Relative 2 %   Eosinophils Absolute 0.6 (H) 0.0 - 0.5 K/uL   Basophils Relative 0 %   Basophils Absolute 0.2 (H) 0.0 - 0.1 K/uL   Immature Granulocytes 2 %   Abs Immature Granulocytes 0.85 (H) 0.00 - 0.07 K/uL    Comment: Performed at [facility], [address] [place], [place] [address]  Protime-INR     Status: Abnormal   Collection Time: 03/22/19  5:31 PM  Result Value Ref Range   Prothrombin Time 21.5 (H) 11.4 - 15.2 seconds   INR 1.9 (H) 0.8 - 1.2    Comment: (NOTE) INR goal varies based on device and disease states. Performed at [facility], [address] [place], [place] [address]   Urinalysis, Complete w Microscopic     Status: Abnormal   Collection Time: 03/22/19  5:55 PM  Result Value Ref Range   Color, Urine YELLOW (A) YELLOW   APPearance  CLOUDY (A) CLEAR   Specific Gravity, Urine 1.011 1.005 - 1.030   pH 5.0 5.0 - 8.0   Glucose, UA NEGATIVE NEGATIVE mg/dL   Hgb urine dipstick MODERATE (A) NEGATIVE   Bilirubin Urine NEGATIVE NEGATIVE   Ketones, ur NEGATIVE NEGATIVE mg/dL   Protein, ur 30 (A) NEGATIVE mg/dL   Nitrite NEGATIVE NEGATIVE   Leukocytes,Ua MODERATE (A) NEGATIVE   RBC / HPF 6-10 0 - 5 RBC/hpf   WBC, UA >50 (H) 0 - 5 WBC/hpf   Bacteria, UA MANY (A) NONE SEEN   Squamous Epithelial / LPF 0-5 0 - 5   Mucus PRESENT    Budding Yeast PRESENT    RBC Casts, UA PRESENT     Comment: Performed at [facility], [address] [place],  [address]  SARS Coronavirus 2 (CEPHEID - Performed in [place] hospital lab), Hosp Order     Status: None   Collection Time: 03/22/19  8:45 PM  Result Value Ref Range   SARS Coronavirus 2 NEGATIVE NEGATIVE    Comment: (NOTE) If result is NEGATIVE SARS-CoV-2 target nucleic acids are NOT DETECTED. The SARS-CoV-2 RNA is generally detectable in upper and lower  respiratory specimens during the acute phase of infection. The lowest  concentration of SARS-CoV-2 viral copies this assay can detect is 250  copies / mL. A negative result does not preclude SARS-CoV-2 infection  and should not be used as the sole basis for treatment or other  patient management decisions.  A negative result may occur with  improper specimen collection /  handling, submission of specimen other  than nasopharyngeal swab, presence of viral mutation(s) within the  areas targeted by this assay, and inadequate number of viral copies  (<250 copies / mL). A negative result must be combined with clinical  observations, patient history, and epidemiological information. If result is POSITIVE SARS-CoV-2 target nucleic acids are DETECTED. The SARS-CoV-2 RNA is generally detectable in upper and lower  respiratory specimens dur ing the acute phase of infection.  Positive  results are indicative of active  infection with SARS-CoV-2.  Clinical  correlation with patient history and other diagnostic information is  necessary to determine patient infection status.  Positive results do  not rule out bacterial infection or co-infection with other viruses. If result is PRESUMPTIVE POSTIVE SARS-CoV-2 nucleic acids MAY BE PRESENT.   A presumptive positive result was obtained on the submitted specimen  and confirmed on repeat testing.  While 2019 novel coronavirus  (SARS-CoV-2) nucleic acids may be present in the submitted sample  additional confirmatory testing may be necessary for epidemiological  and / or clinical management purposes  to differentiate between  SARS-CoV-2 and other Sarbecovirus currently known to infect humans.  If clinically indicated additional testing with an alternate test  methodology [phone]) is advised. The SARS-CoV-2 RNA is generally  detectable in upper and lower respiratory sp ecimens during the acute  phase of infection. The expected result is Negative. Fact Sheet for Patients:  StrictlyIdeas.no Fact Sheet for Healthcare Providers: BankingDealers.co.za This test is not yet approved or cleared by the [place] FDA and has been authorized for detection and/or diagnosis of SARS-CoV-2 by FDA under an Emergency Use Authorization (EUA).  This EUA will remain in effect (meaning this test can be used) for the duration of the COVID-19 declaration under Section 564(b)(1) of the Act, 21 U.S.C. section 360bbb-3(b)(1), unless the authorization is terminated or revoked sooner. Performed at [facility], [address] [place], [place] [address]   Urinalysis, Complete w Microscopic     Status: Abnormal   Collection Time: 03/22/19  8:45 PM  Result Value Ref Range   Color, Urine YELLOW (A) YELLOW   APPearance CLOUDY (A) CLEAR   Specific Gravity, Urine 1.009 1.005 - 1.030   pH 5.0 5.0 - 8.0   Glucose, UA NEGATIVE  NEGATIVE mg/dL   Hgb urine dipstick LARGE (A) NEGATIVE   Bilirubin Urine NEGATIVE NEGATIVE   Ketones, ur NEGATIVE NEGATIVE mg/dL   Protein, ur 30 (A) NEGATIVE mg/dL   Nitrite NEGATIVE NEGATIVE   Leukocytes,Ua LARGE (A) NEGATIVE   RBC / HPF >50 (H) 0 - 5 RBC/hpf   WBC, UA 21-50 0 - 5 WBC/hpf   Bacteria, UA MANY (A) NONE SEEN   Squamous Epithelial / LPF 0-5 0 - 5   WBC Clumps PRESENT    Mucus PRESENT    Budding Yeast PRESENT    Granular Casts, UA PRESENT    Amorphous Crystal PRESENT     Comment: Performed at [facility], [address] [place], [place] [address]   Ct Renal Stone Study  Result Date: 03/22/2019 CLINICAL DATA:  Left kidney catheter would not drain EXAM: CT ABDOMEN AND PELVIS WITHOUT CONTRAST TECHNIQUE: Multidetector CT imaging of the abdomen and pelvis was performed following the standard protocol without IV contrast. COMPARISON:  CT abdomen pelvis, 02/21/2019 FINDINGS: Lower chest: No acute abnormality. Three-vessel coronary artery calcifications. Hepatobiliary: No focal liver abnormality is seen. Status post cholecystectomy. No biliary dilatation. Pancreas: Unremarkable. No pancreatic ductal dilatation or surrounding inflammatory  changes. Spleen: Normal in size without focal abnormality. Adrenals/Urinary Tract: Adrenal glands are unremarkable. There has been interval placement of bilateral percutaneous nephrostomy catheters, with formed pigtails in the bilateral renal pelves. There is moderate left hydronephrosis and hydroureter, with decompression of the right collecting system. Redemonstrated, large, partially necrotic mass of the bladder. There has been interval removal of a previously seen Foley catheter. There is air and fluid within the bladder lumen. Stomach/Bowel: Stomach is within normal limits. No evidence of bowel wall thickening, distention, or inflammatory changes. Large burden of stool in the colon. Vascular/Lymphatic: Scattered calcific atherosclerosis. No  enlarged abdominal or pelvic lymph nodes. Reproductive: No mass or other abnormality. Other: No abdominal wall hernia or abnormality. No abdominopelvic ascites. Musculoskeletal: No acute or significant osseous findings. Severe osteopenia. IMPRESSION: 1. There has been interval placement of bilateral percutaneous nephrostomy catheters, with formed pigtails in the bilateral renal pelves. There is moderate left hydronephrosis and hydroureter similar to prior examination, with decompression of the right collecting system. 2. Redemonstrated, large, partially necrotic mass of the bladder. There has been interval removal of a previously seen Foley catheter. There is air and fluid within the bladder lumen. Electronically Signed   By: Eddie Candle M.D.   On: 03/22/2019 19:12    Pending Labs Unresulted Labs (From admission, onward)    Start     Ordered   03/22/19 2042  Urine culture  Once,   STAT     03/22/19 2041   03/22/19 1817  Culture, blood (routine x 2)  BLOOD CULTURE X 2,   STAT     03/22/19 1816   03/22/19 1742  Urine culture  Once,   STAT     03/22/19 1742   Signed and Held  Basic metabolic panel  Tomorrow morning,   R     Signed and Held   Signed and Held  CBC  Tomorrow morning,   R     Signed and Held          Vitals/Pain Today's Vitals   03/22/19 2030 03/22/19 2100 03/22/19 2130 03/22/19 2200  BP: 109/63 109/70 107/60 114/61  Pulse:      Resp: (!) 29 (!) 21 (!) 21 20  Temp:      TempSrc:      SpO2:      Weight:      Height:      PainSc:        Isolation Precautions No active isolations  Medications Medications  piperacillin-tazobactam (ZOSYN) IVPB 3.375 g (has no administration in time range)  sodium chloride 0.9 % bolus 500 mL (0 mLs Intravenous Stopped 03/22/19 1905)  piperacillin-tazobactam (ZOSYN) IVPB 3.375 g (0 g Intravenous Stopped 03/22/19 2104)    Mobility walks Low fall risk   Focused Assessments na       R Recommendations: See Admitting Provider  Note  Report given to:   Additional Notes: na

## 2019-03-22 NOTE — Telephone Encounter (Signed)
Granddaughter called to report that patient was seen yesterday and had treatment an also had his tubes cleaned out while in office. Since then, the patient's left kidney tube has not drained into the bag but is leaking all over him from the incision site. She is asking what to do. Please advise

## 2019-03-22 NOTE — ED Provider Notes (Addendum)
[facility] Emergency Department Provider Note  ____________________________________________   I have reviewed the triage vital signs and the nursing notes. Where available I have reviewed prior notes and, if possible and indicated, outside hospital notes.    HISTORY  Chief Complaint kidney cath issue    HPI Vincent Poole is a 84 y.o. male  Patient seen and evaluated during the coronavirus epidemic during a time with low staffing.  Patient has a history of anxiety, arthritis, bladder cancer, bilateral nephrostomy tubes placed by IR little over a month ago, he has a history of tachycardia when he gets nervous, history of elevated white count, history of chronic renal insufficiency, he has been having trouble with his left nephrostomy tube.  They did have to reposition and flush it yesterday.  Today they noticed that there was drainage around the tube itself.  This is new.  He is scheduled to have the tubes replaced on Monday he states.  He is followed by urology, as well as oncology.  Baseline white count is in the 30s to 40s.  Yesterday was in the 40s.  Is not complaining of any increased pain, wife thought he had a fever right before EMS got there but when EMS got there it was normal.  He has a low-grade temperature here.  Cough or URI symptoms.  Just concerned about the ability of the left nephrostomy tube to drain and the fact that the urine seems to be coming around the tube and not down the drain itself. no Reported complications with the right nephrostomy tube.      Past Medical History:  Diagnosis Date  . Anxiety   . Arthritis   . Bladder cancer ([place])   . Dysrhythmia 07/2018   history of atrial flutter that worsens with anxiety  . Femur fracture, right ([place]) 05/01/2018  . GERD (gastroesophageal reflux disease)   . History of recent blood transfusion 05/2018  . Hypertension   . Iron deficiency anemia 09/14/2018  . Prostate cancer ([place]) 07/2018    cancer growing in prostate but not prostate cancer, it is from the bladder  . Umbilical hernia 26/3785  . Urinary retention 2019   foley catheter place 11/2017  . UTI (urinary tract infection) 2019   frequent UTI's over last year  . Wound eschar of foot 07/2018   left heal getting wrapped and requiring antibiotic cream. cracks open with weight bearing.    Patient Active Problem List   Diagnosis Date Noted  . Leukocytosis 02/21/2019  . Complicated UTI (urinary tract infection) 11/14/2018  . Palliative care encounter   . Iron deficiency anemia 09/14/2018  . Hip fracture ([place]) 05/01/2018  . Malignant neoplasm of urinary bladder ([place]) 01/12/2018  . Goals of care, counseling/discussion 01/12/2018  . Pyelonephritis 05/31/2017  . UTI (urinary tract infection) 03/04/2017  . Urinary obstruction   . Essential hypertension 08/30/2016  . History of shingles 08/30/2016  . Prostate cancer ([place]) 08/30/2016  . Urinary retention 08/30/2016  . Medicare annual wellness visit, initial 08/01/2016  . Medicare annual wellness visit, subsequent 08/01/2016  . Sepsis ([place]) 07/11/2016  . Borderline diabetes mellitus 01/26/2016  . Vaccine counseling 01/26/2015  . Lumbar radiculitis 06/24/2014  . OA (osteoarthritis) of hip 06/24/2014  . Malignant neoplasm of prostate ([place]) 01/27/2011    Past Surgical History:  Procedure Laterality Date  . CARPAL TUNNEL RELEASE Right   . CHOLECYSTECTOMY  2004  . CYSTOGRAM  07/18/2018   Procedure: CYSTOGRAM;  Surgeon: [name], MD;  Location: [facility];  Service: Urology;;  . Consuela Mimes W/ RETROGRADES Bilateral 07/18/2018   Procedure: CYSTOSCOPY WITH RETROGRADE PYELOGRAM;  Surgeon: [name], MD;  Location: [facility];  Service: Urology;  Laterality: Bilateral;  . CYSTOSCOPY W/ URETERAL STENT PLACEMENT Bilateral 12/27/2017   Procedure: CYSTOSCOPY WITH RETROGRADE PYELOGRAM/URETERAL STENT PLACEMENT;  Surgeon: [name], MD;  Location: [facility];  Service:  Urology;  Laterality: Bilateral;  . CYSTOSCOPY W/ URETERAL STENT PLACEMENT Bilateral 07/18/2018   Procedure: CYSTOSCOPY WITH STENT REPLACEMENT (exchange);  Surgeon: [name], MD;  Location: [facility];  Service: Urology;  Laterality: Bilateral;  . CYSTOSCOPY WITH STENT PLACEMENT Bilateral 11/12/2018   Procedure: [place] WITH STENT Exchange;  Surgeon: [name], MD;  Location: [facility];  Service: Urology;  Laterality: Bilateral;  . INTRAMEDULLARY (IM) NAIL INTERTROCHANTERIC Right 05/02/2018   Procedure: INTRAMEDULLARY (IM) NAIL INTERTROCHANTRIC;  Surgeon: [name], MD;  Location: [facility];  Service: Orthopedics;  Laterality: Right;  . IR NEPHROSTOMY PLACEMENT LEFT  02/23/2019  . IR NEPHROSTOMY PLACEMENT RIGHT  02/23/2019  . LEG TENDON SURGERY Right 1958  . PORTA CATH INSERTION N/A 01/22/2018   Procedure: PORTA CATH INSERTION;  Surgeon: [name], MD;  Location: [facility];  Service: Cardiovascular;  Laterality: N/A;  . TRANSURETHRAL RESECTION OF BLADDER TUMOR N/A 12/27/2017   Procedure: TRANSURETHRAL RESECTION OF BLADDER TUMOR (TURBT);  Surgeon: [name], MD;  Location: [facility];  Service: Urology;  Laterality: N/A;  . TRANSURETHRAL RESECTION OF BLADDER TUMOR N/A 01/15/2018   Procedure: TRANSURETHRAL RESECTION OF BLADDER TUMOR (TURBT);  Surgeon: [name], MD;  Location: [facility];  Service: Urology;  Laterality: N/A;  Need 2 hrs for this case please  . TRANSURETHRAL RESECTION OF BLADDER TUMOR N/A 07/18/2018   Procedure: TRANSURETHRAL RESECTION OF BLADDER TUMOR (TURBT);  Surgeon: [name], MD;  Location: [facility];  Service: Urology;  Laterality: N/A;    Prior to Admission medications   Medication Sig Start Date End Date Taking? Authorizing Provider  acetaminophen (TYLENOL) 500 MG tablet Take 1,000 mg by mouth every 6 (six) hours as needed for moderate pain or headache.     [provider]  cetirizine (ZYRTEC) 10 MG tablet Take 10 mg by mouth  daily as needed for allergies.     [provider]  Cranberry-Cholecalciferol (SUPER CRANBERRY/VITAMIN D3) 4200-500 MG-UNIT CAPS Take 1 capsule by mouth 2 (two) times daily.    [provider]  ELIQUIS 5 MG TABS tablet TAKE ONE TABLET BY MOUTH TWICE DAILY Patient taking differently: Take 5 mg by mouth 2 (two) times daily.  01/30/19   Cammie Sickle, MD  ferrous sulfate 325 (65 FE) MG tablet Take 1 tablet (325 mg total) by mouth 2 (two) times daily with a meal. 05/06/18   Gouru, Aruna, MD  lidocaine-prilocaine (EMLA) cream Apply 1 application topically as needed. 01/02/19   Sindy Guadeloupe, MD  Multiple Vitamin (MULTIVITAMIN WITH MINERALS) TABS tablet Take 1 tablet by mouth daily.    [provider]  MYRBETRIQ 50 MG TB24 tablet TAKE ONE TABLET EVERY DAY Patient taking differently: Take 50 mg by mouth daily.  01/02/19   Zara Council A, PA-C  nystatin [facility]) powder Apply topically 2 (two) times daily. 09/28/18   Zara Council A, PA-C  oxyCODONE (OXY IR/ROXICODONE) 5 MG immediate release tablet Take 1 tablet (5 mg total) by mouth every 4 (four) hours as needed for severe pain. 03/21/19   Sindy Guadeloupe, MD  polyethylene glycol powder [facility]) powder  Take 17 g by mouth daily as needed. Can increase to 3 times a day as needed for constipation but hold medication if has diarrhea Patient not taking: Reported on 03/21/2019 01/30/18   Sindy Guadeloupe, MD  potassium chloride SA (K-DUR) 20 MEQ tablet Take 20 mEq by mouth 2 (two) times daily.    [provider]  vitamin B-12 (CYANOCOBALAMIN) 500 MCG tablet Take 500 mcg by mouth daily.    [provider]  zinc oxide 20 % ointment Apply 1 application topically as needed for irritation.    [provider]    Allergies Patient has no known allergies.  Family History  Problem Relation Age of Onset  . Cancer Mother   . Chronic Renal Failure Mother   . Heart disease Father     Social  History Social History   Tobacco Use  . Smoking status: Never Smoker  . Smokeless tobacco: Never Used  Substance Use Topics  . Alcohol use: No    Alcohol/week: 0.0 standard drinks  . Drug use: No    Review of Systems Constitutional: No fever/chills Eyes: No visual changes. ENT: No sore throat. No stiff neck no neck pain Cardiovascular: Denies chest pain. Respiratory: Denies shortness of breath. Gastrointestinal:   no vomiting.  No diarrhea.  No constipation. Genitourinary: Negative for dysuria. Musculoskeletal: Negative lower extremity swelling Skin: Negative for rash. Neurological: Negative for severe headaches, focal weakness or numbness.   ____________________________________________   PHYSICAL EXAM:  VITAL SIGNS: ED Triage Vitals  Enc Vitals Group     BP 03/22/19 1722 120/70     Pulse Rate 03/22/19 1722 (!) 134     Resp 03/22/19 1800 (!) 22     Temp 03/22/19 1722 99.6 F (37.6 C)     Temp Source 03/22/19 1722 Oral     SpO2 03/22/19 1722 96 %     Weight 03/22/19 1723 170 lb (77.1 kg)     Height 03/22/19 1723 5\' 11"  (1.803 m)     Head Circumference --      Peak Flow --      Pain Score 03/22/19 1723 0     Pain Loc --      Pain Edu? --      Excl. in [place]? --     Constitutional: Alert and oriented very hard of hearing. Well appearing and in no acute distress. Eyes: Conjunctivae are normal Head: Atraumatic HEENT: No congestion/rhinnorhea. Mucous membranes are moist.  Oropharynx non-erythematous Neck:   Nontender with no meningismus, no masses, no stridor Cardiovascular: Tachycardia noted regular rhythm. Grossly normal heart sounds.  Good peripheral circulation. Respiratory: Normal respiratory effort.  No retractions. Lungs CTAB. Abdominal: Soft and nontender. No distention. No guarding no rebound Back:  There is no focal tenderness or step off.  there is no midline tenderness there are no lesions noted. there is no CVA tenderness nephrostomy tube on the left  appears to be in good position but there is urine around it noted on the bandage.  The area of insertion does not appear to be significantly erythematous or fluctuant.  The left nephrostomy tube is not apparently draining urine.  The bag is empty. Musculoskeletal: No lower extremity tenderness, no upper extremity tenderness. No joint effusions, no DVT signs strong distal pulses no edema Neurologic:  Normal speech and language. No gross focal neurologic deficits are appreciated.  Skin:  Skin is warm, dry and intact. No rash noted. Psychiatric: Mood and affect are a bit anxious. Speech and  behavior are normal.  ____________________________________________   LABS (all labs ordered are listed, but only abnormal results are displayed)  Labs Reviewed  COMPREHENSIVE METABOLIC PANEL - Abnormal; Notable for the following components:      Result Value   Sodium 130 (*)    CO2 21 (*)    Glucose, Bld 114 (*)    BUN 32 (*)    Creatinine, Ser 2.07 (*)    Calcium 8.5 (*)    Albumin 2.5 (*)    GFR calc non Af Amer 29 (*)    GFR calc Af Amer 34 (*)    All other components within normal limits  CBC WITH DIFFERENTIAL/PLATELET - Abnormal; Notable for the following components:   WBC 36.8 (*)    RBC 2.83 (*)    Hemoglobin 8.7 (*)    HCT 27.1 (*)    Neutro Abs 30.8 (*)    Monocytes Absolute 2.5 (*)    Eosinophils Absolute 0.6 (*)    Basophils Absolute 0.2 (*)    Abs Immature Granulocytes 0.85 (*)    All other components within normal limits  PROTIME-INR - Abnormal; Notable for the following components:   Prothrombin Time 21.5 (*)    INR 1.9 (*)    All other components within normal limits  URINALYSIS, COMPLETE (UACMP) WITH MICROSCOPIC - Abnormal; Notable for the following components:   Color, Urine YELLOW (*)    APPearance CLOUDY (*)    Hgb urine dipstick MODERATE (*)    Protein, ur 30 (*)    Leukocytes,Ua MODERATE (*)    WBC, UA >50 (*)    Bacteria, UA MANY (*)    All other components within  normal limits  URINE CULTURE  CULTURE, BLOOD (ROUTINE X 2)  CULTURE, BLOOD (ROUTINE X 2)  LACTIC ACID, PLASMA  LACTIC ACID, PLASMA    Pertinent labs  results that were available during my care of the patient were reviewed by me and considered in my medical decision making (see chart for details). ____________________________________________  EKG  I personally interpreted any EKGs ordered by me or triage 2 EKGs were obtained, for shows sinus tach rate 134, no acute ischemic changes second showed sinus tach 121 no acute ischemic changes normal axis on both ____________________________________________  RADIOLOGY  Pertinent labs & imaging results that were available during my care of the patient were reviewed by me and considered in my medical decision making (see chart for details). If possible, patient and/or family made aware of any abnormal findings.  Ct Renal Stone Study  Result Date: 03/22/2019 CLINICAL DATA:  Left kidney catheter would not drain EXAM: CT ABDOMEN AND PELVIS WITHOUT CONTRAST TECHNIQUE: Multidetector CT imaging of the abdomen and pelvis was performed following the standard protocol without IV contrast. COMPARISON:  CT abdomen pelvis, 02/21/2019 FINDINGS: Lower chest: No acute abnormality. Three-vessel coronary artery calcifications. Hepatobiliary: No focal liver abnormality is seen. Status post cholecystectomy. No biliary dilatation. Pancreas: Unremarkable. No pancreatic ductal dilatation or surrounding inflammatory changes. Spleen: Normal in size without focal abnormality. Adrenals/Urinary Tract: Adrenal glands are unremarkable. There has been interval placement of bilateral percutaneous nephrostomy catheters, with formed pigtails in the bilateral renal pelves. There is moderate left hydronephrosis and hydroureter, with decompression of the right collecting system. Redemonstrated, large, partially necrotic mass of the bladder. There has been interval removal of a previously  seen Foley catheter. There is air and fluid within the bladder lumen. Stomach/Bowel: Stomach is within normal limits. No evidence of bowel wall thickening, distention, or inflammatory  changes. Large burden of stool in the colon. Vascular/Lymphatic: Scattered calcific atherosclerosis. No enlarged abdominal or pelvic lymph nodes. Reproductive: No mass or other abnormality. Other: No abdominal wall hernia or abnormality. No abdominopelvic ascites. Musculoskeletal: No acute or significant osseous findings. Severe osteopenia. IMPRESSION: 1. There has been interval placement of bilateral percutaneous nephrostomy catheters, with formed pigtails in the bilateral renal pelves. There is moderate left hydronephrosis and hydroureter similar to prior examination, with decompression of the right collecting system. 2. Redemonstrated, large, partially necrotic mass of the bladder. There has been interval removal of a previously seen Foley catheter. There is air and fluid within the bladder lumen. Electronically Signed   By: Eddie Candle M.D.   On: 03/22/2019 19:12   ____________________________________________    PROCEDURES  Procedure(s) performed: None  Procedures  Critical Care performed: None  ____________________________________________   INITIAL IMPRESSION / ASSESSMENT AND PLAN / ED COURSE  Pertinent labs & imaging results that were available during my care of the patient were reviewed by me and considered in my medical decision making (see chart for details).  What the complicated patient, his white count is elevated but it is always elevated his heart rate is elevated but his document is being elevated when he is around providers according to his wife.  I did talk to him and his wife.  The concern is that there is some drainage around the tube.  Urine looks infected from the right nephrostomy tube but it always looks infected.  We have sent a culture.  The CT scan does not show any acute pathology,  lactic acid is negative, kidney function is near its baseline, certainly possible the patient is infected so I am sending cultures, I have paged urology, given him some IV fluid, and we will see what urology thinks about his tube.  ----------------------------------------- 8:12 PM on 03/22/2019 -----------------------------------------  Discussed with Dr. Junious Silk who suggested that we try using a sterile saline to either flush or draw back on the tube which we will do.  He and I talked about all the patient's findings.  He agrees is very difficult to know if this patient significantly ill given his baseline, although he does not look terribly ill he is been tachycardic here and his white count is chronically elevated and etc.  Given all of this, we will give him a dose of Zosyn here which should cover his last positive culture, we will await culture results, and urology requests admission to the hospitalist service.  Discussed with Dr. Jannifer Franklin who, agrees with plan and we will admit.  ----------------------------------------- 8:41 PM on 03/22/2019 -----------------------------------------  After flushing, there is good drainage at this time we will send urinalysis from the left as well.   ____________________________________________   FINAL CLINICAL IMPRESSION(S) / ED DIAGNOSES  Final diagnoses:  None      This chart was dictated using voice recognition software.  Despite best efforts to proofread,  errors can occur which can change meaning.      Schuyler Amor, MD 03/22/19 Artist Pais    Schuyler Amor, MD 03/22/19 2013    Schuyler Amor, MD 03/22/19 2041

## 2019-03-22 NOTE — Telephone Encounter (Signed)
I called grandaughter back and she states she is CNA and she saw that the tube was not draining and she flushed the tubing like she did before and all the drainage was coming out of the insertion site going into his body. She states that he is soaked and left side at insertion site is causing him a lot of pain. She gave him oxycodone 2:52. I checked with specials procedures to see what can be done. At the time of call I did not know it was drenching the patient with the amount of fluid coming out of insertion site.  Juanita in specials said that they will need to go to ER. If they had called about 12noon they could have worked him in. Once evaluated in ER they can speak with on call staff to see what needs to be done. They also could go to the [facility].-they do that specialty staff 24 hours a day there. The will be taken care of either place they go.  Granddaughter will tell her grandmother what to do based in phone call

## 2019-03-22 NOTE — ED Triage Notes (Signed)
Pt arrives via EMS from home after his left kidney catheter had not drained in a couple days- family states it flushes okay but will not drain- EMS states the pt house was dirty and the pt was pulling bugs off of himself when they arrived

## 2019-03-22 NOTE — ED Notes (Signed)
Left urinary drain bag emptied

## 2019-03-22 NOTE — H&P (Signed)
[facility] [place] NAME: Vincent Poole    MR#:  [id number]  DATE OF BIRTH:  01-24-1935  DATE OF ADMISSION:  03/22/2019  PRIMARY CARE PHYSICIAN: [name], MD   REQUESTING/REFERRING PHYSICIAN: Burlene Arnt, MD  CHIEF COMPLAINT:   Chief Complaint  Patient presents with  . kidney cath issue    HISTORY OF PRESENT ILLNESS:  Vincent Poole  is a 84 y.o. male who presents with chief complaint as above.  Patient presents to the ED with a complaint of nephrostomy tube malfunction.  Family has been trying to flush his tube at home, but he has been having some leakage around the tube insertion, difficulty flushing it and draining.  Here in the ED his work-up is consistent with Sirs, though some of this seems to be chronic in nature.  He is tachycardic.  He has a leukocytosis, which seems to be chronic.  His urine also looks possibly infected, though it is also unclear whether or not this is chronic given the fact that he does have bladder cancer and indwelling nephrostomy tubes.  Family does report fever at home, though this is not been observed here in the ED.  Given his persistent tachycardia, hospitalist were called for admission and further evaluation.  PAST MEDICAL HISTORY:   Past Medical History:  Diagnosis Date  . Anxiety   . Arthritis   . Bladder cancer ([place])   . Dysrhythmia 07/2018   history of atrial flutter that worsens with anxiety  . Femur fracture, right ([place]) 05/01/2018  . GERD (gastroesophageal reflux disease)   . History of recent blood transfusion 05/2018  . Hypertension   . Iron deficiency anemia 09/14/2018  . Prostate cancer ([place]) 07/2018   cancer growing in prostate but not prostate cancer, it is from the bladder  . Umbilical hernia 73/5329  . Urinary retention 2019   foley catheter place 11/2017  . UTI (urinary tract infection) 2019   frequent UTI's over last year  . Wound eschar of foot 07/2018   left heal  getting wrapped and requiring antibiotic cream. cracks open with weight bearing.     PAST SURGICAL HISTORY:   Past Surgical History:  Procedure Laterality Date  . CARPAL TUNNEL RELEASE Right   . CHOLECYSTECTOMY  2004  . CYSTOGRAM  07/18/2018   Procedure: CYSTOGRAM;  Surgeon: [name], MD;  Location: [facility];  Service: Urology;;  . Consuela Mimes W/ RETROGRADES Bilateral 07/18/2018   Procedure: CYSTOSCOPY WITH RETROGRADE PYELOGRAM;  Surgeon: [name], MD;  Location: [facility];  Service: Urology;  Laterality: Bilateral;  . CYSTOSCOPY W/ URETERAL STENT PLACEMENT Bilateral 12/27/2017   Procedure: CYSTOSCOPY WITH RETROGRADE PYELOGRAM/URETERAL STENT PLACEMENT;  Surgeon: [name], MD;  Location: [facility];  Service: Urology;  Laterality: Bilateral;  . CYSTOSCOPY W/ URETERAL STENT PLACEMENT Bilateral 07/18/2018   Procedure: CYSTOSCOPY WITH STENT REPLACEMENT (exchange);  Surgeon: [name], MD;  Location: [facility];  Service: Urology;  Laterality: Bilateral;  . CYSTOSCOPY WITH STENT PLACEMENT Bilateral 11/12/2018   Procedure: [place] WITH STENT Exchange;  Surgeon: [name], MD;  Location: [facility];  Service: Urology;  Laterality: Bilateral;  . INTRAMEDULLARY (IM) NAIL INTERTROCHANTERIC Right 05/02/2018   Procedure: INTRAMEDULLARY (IM) NAIL INTERTROCHANTRIC;  Surgeon: [name], MD;  Location: [facility];  Service: Orthopedics;  Laterality: Right;  . IR NEPHROSTOMY PLACEMENT LEFT  02/23/2019  . IR NEPHROSTOMY PLACEMENT RIGHT  02/23/2019  . LEG TENDON SURGERY Right 1958  . PORTA CATH INSERTION N/A  01/22/2018   Procedure: PORTA CATH INSERTION;  Surgeon: [name], MD;  Location: [facility];  Service: Cardiovascular;  Laterality: N/A;  . TRANSURETHRAL RESECTION OF BLADDER TUMOR N/A 12/27/2017   Procedure: TRANSURETHRAL RESECTION OF BLADDER TUMOR (TURBT);  Surgeon: [name], MD;  Location: [facility];  Service: Urology;  Laterality: N/A;  . TRANSURETHRAL  RESECTION OF BLADDER TUMOR N/A 01/15/2018   Procedure: TRANSURETHRAL RESECTION OF BLADDER TUMOR (TURBT);  Surgeon: [name], MD;  Location: [facility];  Service: Urology;  Laterality: N/A;  Need 2 hrs for this case please  . TRANSURETHRAL RESECTION OF BLADDER TUMOR N/A 07/18/2018   Procedure: TRANSURETHRAL RESECTION OF BLADDER TUMOR (TURBT);  Surgeon: [name], MD;  Location: [facility];  Service: Urology;  Laterality: N/A;     SOCIAL HISTORY:   Social History   Tobacco Use  . Smoking status: Never Smoker  . Smokeless tobacco: Never Used  Substance Use Topics  . Alcohol use: No    Alcohol/week: 0.0 standard drinks     FAMILY HISTORY:   Family History  Problem Relation Age of Onset  . Cancer Mother   . Chronic Renal Failure Mother   . Heart disease Father      DRUG ALLERGIES:  No Known Allergies  MEDICATIONS AT HOME:   Prior to Admission medications   Medication Sig Start Date End Date Taking? Authorizing Provider  cetirizine (ZYRTEC) 10 MG tablet Take 10 mg by mouth daily as needed for allergies.    Yes [provider]  Cranberry-Cholecalciferol (SUPER CRANBERRY/VITAMIN D3) 4200-500 MG-UNIT CAPS Take 1 capsule by mouth 2 (two) times daily.   Yes [provider]  ELIQUIS 5 MG TABS tablet TAKE ONE TABLET BY MOUTH TWICE DAILY Patient taking differently: Take 5 mg by mouth 2 (two) times daily.  01/30/19  Yes Cammie Sickle, MD  ferrous sulfate 325 (65 FE) MG tablet Take 1 tablet (325 mg total) by mouth 2 (two) times daily with a meal. 05/06/18  Yes Gouru, Aruna, MD  Multiple Vitamin (MULTIVITAMIN WITH MINERALS) TABS tablet Take 1 tablet by mouth daily.   Yes [provider]  MYRBETRIQ 50 MG TB24 tablet TAKE ONE TABLET EVERY DAY Patient taking differently: Take 50 mg by mouth daily.  01/02/19  Yes McGowan, Larene Beach A, PA-C  oxyCODONE (OXY IR/ROXICODONE) 5 MG immediate release tablet Take 1 tablet (5 mg total) by mouth every 4 (four) hours  as needed for severe pain. 03/21/19  Yes Sindy Guadeloupe, MD  potassium chloride SA (K-DUR) 20 MEQ tablet Take 20 mEq by mouth 2 (two) times daily.   Yes [provider]  vitamin B-12 (CYANOCOBALAMIN) 500 MCG tablet Take 500 mcg by mouth daily.   Yes [provider]  acetaminophen (TYLENOL) 500 MG tablet Take 1,000 mg by mouth every 6 (six) hours as needed for moderate pain or headache.     [provider]  lidocaine-prilocaine (EMLA) cream Apply 1 application topically as needed. 01/02/19   Sindy Guadeloupe, MD  nystatin [facility]) powder Apply topically 2 (two) times daily. 09/28/18   Zara Council A, PA-C  polyethylene glycol powder (MIRALAX) powder Take 17 g by mouth daily as needed. Can increase to 3 times a day as needed for constipation but hold medication if has diarrhea 01/30/18   Sindy Guadeloupe, MD  zinc oxide 20 % ointment Apply 1 application topically as needed for irritation.    [provider]    REVIEW OF SYSTEMS:  Review of Systems  Constitutional: Positive for fever. Negative for chills, malaise/fatigue and weight loss.  HENT: Negative for ear pain, hearing loss and tinnitus.   Eyes: Negative for blurred vision, double vision, pain and redness.  Respiratory: Negative for cough, hemoptysis and shortness of breath.   Cardiovascular: Negative for chest pain, palpitations, orthopnea and leg swelling.  Gastrointestinal: Negative for abdominal pain, constipation, diarrhea, nausea and vomiting.  Genitourinary: Negative for dysuria, frequency and hematuria.  Musculoskeletal: Negative for back pain, joint pain and neck pain.  Skin:       No acne, rash, or lesions  Neurological: Negative for dizziness, tremors, focal weakness and weakness.  Endo/Heme/Allergies: Negative for polydipsia. Does not bruise/bleed easily.  Psychiatric/Behavioral: Negative for depression. The patient is not nervous/anxious and does not have insomnia.      VITAL SIGNS:    Vitals:   03/22/19 2015 03/22/19 2030 03/22/19 2100 03/22/19 2130  BP:  109/63 109/70 107/60  Pulse:      Resp: 20 (!) 29 (!) 21 (!) 21  Temp:      TempSrc:      SpO2:      Weight:      Height:       Wt Readings from Last 3 Encounters:  03/22/19 77.1 kg  02/25/19 75.2 kg  02/04/19 83 kg    PHYSICAL EXAMINATION:  Physical Exam  Vitals reviewed. Constitutional: He is oriented to person, place, and time. He appears well-developed and well-nourished. No distress.  HENT:  Head: Normocephalic and atraumatic.  Mouth/Throat: Oropharynx is clear and moist.  Eyes: Pupils are equal, round, and reactive to light. Conjunctivae and EOM are normal. No scleral icterus.  Neck: Normal range of motion. Neck supple. No JVD present. No thyromegaly present.  Cardiovascular: Regular rhythm and intact distal pulses. Exam reveals no gallop and no friction rub.  No murmur heard. Tachycardic  Respiratory: Effort normal and breath sounds normal. No respiratory distress. He has no wheezes. He has no rales.  GI: Soft. Bowel sounds are normal. He exhibits no distension. There is no abdominal tenderness.  Musculoskeletal: Normal range of motion.        General: No edema.     Comments: No arthritis, no gout  Lymphadenopathy:    He has no cervical adenopathy.  Neurological: He is alert and oriented to person, place, and time. No cranial nerve deficit.  No dysarthria, no aphasia  Skin: Skin is warm and dry. No rash noted. No erythema.  Psychiatric: He has a normal mood and affect. His behavior is normal. Judgment and thought content normal.    LABORATORY PANEL:   CBC Recent Labs  Lab 03/22/19 1731  WBC 36.8*  HGB 8.7*  HCT 27.1*  PLT 355   ------------------------------------------------------------------------------------------------------------------  Chemistries  Recent Labs  Lab 03/22/19 1731  NA 130*  K 4.1  CL 99  CO2 21*  GLUCOSE 114*  BUN 32*  CREATININE 2.07*  CALCIUM 8.5*   AST 23  ALT 22  ALKPHOS 92  BILITOT 0.6   ------------------------------------------------------------------------------------------------------------------  Cardiac Enzymes No results for input(s): TROPONINI in the last 168 hours. ------------------------------------------------------------------------------------------------------------------  RADIOLOGY:  Ct Renal Stone Study  Result Date: 03/22/2019 CLINICAL DATA:  Left kidney catheter would not drain EXAM: CT ABDOMEN AND PELVIS WITHOUT CONTRAST TECHNIQUE: Multidetector CT imaging of the abdomen and pelvis was performed following the standard protocol without IV contrast. COMPARISON:  CT abdomen pelvis, 02/21/2019 FINDINGS: Lower chest: No acute abnormality. Three-vessel coronary artery calcifications. Hepatobiliary: No focal liver  abnormality is seen. Status post cholecystectomy. No biliary dilatation. Pancreas: Unremarkable. No pancreatic ductal dilatation or surrounding inflammatory changes. Spleen: Normal in size without focal abnormality. Adrenals/Urinary Tract: Adrenal glands are unremarkable. There has been interval placement of bilateral percutaneous nephrostomy catheters, with formed pigtails in the bilateral renal pelves. There is moderate left hydronephrosis and hydroureter, with decompression of the right collecting system. Redemonstrated, large, partially necrotic mass of the bladder. There has been interval removal of a previously seen Foley catheter. There is air and fluid within the bladder lumen. Stomach/Bowel: Stomach is within normal limits. No evidence of bowel wall thickening, distention, or inflammatory changes. Large burden of stool in the colon. Vascular/Lymphatic: Scattered calcific atherosclerosis. No enlarged abdominal or pelvic lymph nodes. Reproductive: No mass or other abnormality. Other: No abdominal wall hernia or abnormality. No abdominopelvic ascites. Musculoskeletal: No acute or significant osseous findings.  Severe osteopenia. IMPRESSION: 1. There has been interval placement of bilateral percutaneous nephrostomy catheters, with formed pigtails in the bilateral renal pelves. There is moderate left hydronephrosis and hydroureter similar to prior examination, with decompression of the right collecting system. 2. Redemonstrated, large, partially necrotic mass of the bladder. There has been interval removal of a previously seen Foley catheter. There is air and fluid within the bladder lumen. Electronically Signed   By: Eddie Candle M.D.   On: 03/22/2019 19:12    EKG:   Orders placed or performed during the hospital encounter of 03/22/19  . Repeat EKG  . Repeat EKG    IMPRESSION AND PLAN:  Principal Problem:   SIRS (systemic inflammatory response syndrome) (HCC) -patient has been covered with antibiotics upfront, though it is unclear whether this represents acute infection or simply ongoing chronic issues. Active Problems:   Urinary obstruction -we will try and flush nephrostomy tube to get it functioning.  Otherwise patient will need a urology consult   Complicated UTI (urinary tract infection) -IV Zosyn   Essential hypertension -home dose antihypertensives   Malignant neoplasm of urinary bladder (HCC) -continue home meds, if urology consult is required they can make any recommendations as necessary   Leukocytosis -seems to be chronic, IV antibiotics initially as above, though these can likely be discontinued if true risk of infection is felt to be low  Chart review performed and case discussed with ED provider. Labs, imaging and/or ECG reviewed by provider and discussed with patient/family. Management plans discussed with the patient and/or family.  COVID-19 status: Test pending     DVT PROPHYLAXIS: Systemic anticoagulation  GI PROPHYLAXIS:  None  ADMISSION STATUS: Observation  CODE STATUS: Full Code Status History    Date Active Date Inactive Code Status Order ID Comments User Context    02/21/2019 1505 02/26/2019 1849 Full Code [id number]  Sela Hua, MD Inpatient   11/12/2018 1849 11/16/2018 1632 Full Code [id number]  Epifanio Lesches, MD Inpatient   05/01/2018 1702 05/06/2018 1711 Full Code [id number]  Dustin Flock, MD Inpatient   12/26/2017 2141 12/30/2017 1727 Full Code [id number]  Demetrios Loll, MD Inpatient   05/31/2017 0208 06/01/2017 1922 Full Code [id number]  Harvie Bridge, DO Inpatient   03/04/2017 1833 03/05/2017 1426 Full Code [id number]  Idelle Crouch, MD Inpatient   07/11/2016 0538 07/13/2016 1420 Full Code [id number]  Saundra Shelling, MD ED      TOTAL TIME TAKING CARE OF THIS PATIENT: 40 minutes.   This patient was evaluated in the context of the global COVID-19 pandemic, which necessitated consideration that the patient might be at risk for infection  with the SARS-CoV-2 virus that causes COVID-19. Institutional protocols and algorithms that pertain to the evaluation of patients at risk for COVID-19 are in a state of rapid change based on information released by regulatory bodies including the CDC and federal and state organizations. These policies and algorithms were followed to the best of this provider's knowledge to date during the patient's care at this facility.  Ethlyn Daniels 03/22/2019, 9:49 PM  [facility]  Office  [phone]  CC: Primary care physician; [name], MD  Note:  This document was prepared using Dragon voice recognition software and may include unintentional dictation errors.

## 2019-03-23 DIAGNOSIS — Z842 Family history of other diseases of the genitourinary system: Secondary | ICD-10-CM | POA: Diagnosis not present

## 2019-03-23 DIAGNOSIS — D72829 Elevated white blood cell count, unspecified: Secondary | ICD-10-CM | POA: Diagnosis not present

## 2019-03-23 DIAGNOSIS — L8990 Pressure ulcer of unspecified site, unspecified stage: Secondary | ICD-10-CM

## 2019-03-23 DIAGNOSIS — Z7901 Long term (current) use of anticoagulants: Secondary | ICD-10-CM | POA: Diagnosis not present

## 2019-03-23 DIAGNOSIS — D638 Anemia in other chronic diseases classified elsewhere: Secondary | ICD-10-CM | POA: Diagnosis present

## 2019-03-23 DIAGNOSIS — Z79899 Other long term (current) drug therapy: Secondary | ICD-10-CM | POA: Diagnosis not present

## 2019-03-23 DIAGNOSIS — N390 Urinary tract infection, site not specified: Secondary | ICD-10-CM | POA: Diagnosis present

## 2019-03-23 DIAGNOSIS — Z923 Personal history of irradiation: Secondary | ICD-10-CM | POA: Diagnosis not present

## 2019-03-23 DIAGNOSIS — Z9221 Personal history of antineoplastic chemotherapy: Secondary | ICD-10-CM | POA: Diagnosis not present

## 2019-03-23 DIAGNOSIS — K219 Gastro-esophageal reflux disease without esophagitis: Secondary | ICD-10-CM | POA: Diagnosis present

## 2019-03-23 DIAGNOSIS — F419 Anxiety disorder, unspecified: Secondary | ICD-10-CM | POA: Diagnosis present

## 2019-03-23 DIAGNOSIS — N1330 Unspecified hydronephrosis: Secondary | ICD-10-CM | POA: Diagnosis not present

## 2019-03-23 DIAGNOSIS — N136 Pyonephrosis: Secondary | ICD-10-CM | POA: Diagnosis present

## 2019-03-23 DIAGNOSIS — T83012A Breakdown (mechanical) of nephrostomy catheter, initial encounter: Secondary | ICD-10-CM | POA: Diagnosis present

## 2019-03-23 DIAGNOSIS — Z87440 Personal history of urinary (tract) infections: Secondary | ICD-10-CM | POA: Diagnosis not present

## 2019-03-23 DIAGNOSIS — B9689 Other specified bacterial agents as the cause of diseases classified elsewhere: Secondary | ICD-10-CM | POA: Diagnosis present

## 2019-03-23 DIAGNOSIS — Z515 Encounter for palliative care: Secondary | ICD-10-CM | POA: Diagnosis present

## 2019-03-23 DIAGNOSIS — R000 Tachycardia, unspecified: Secondary | ICD-10-CM | POA: Diagnosis not present

## 2019-03-23 DIAGNOSIS — Z9049 Acquired absence of other specified parts of digestive tract: Secondary | ICD-10-CM | POA: Diagnosis not present

## 2019-03-23 DIAGNOSIS — I129 Hypertensive chronic kidney disease with stage 1 through stage 4 chronic kidney disease, or unspecified chronic kidney disease: Secondary | ICD-10-CM | POA: Diagnosis present

## 2019-03-23 DIAGNOSIS — Y732 Prosthetic and other implants, materials and accessory gastroenterology and urology devices associated with adverse incidents: Secondary | ICD-10-CM | POA: Diagnosis present

## 2019-03-23 DIAGNOSIS — C679 Malignant neoplasm of bladder, unspecified: Secondary | ICD-10-CM | POA: Diagnosis present

## 2019-03-23 DIAGNOSIS — M1990 Unspecified osteoarthritis, unspecified site: Secondary | ICD-10-CM | POA: Diagnosis present

## 2019-03-23 DIAGNOSIS — N3289 Other specified disorders of bladder: Secondary | ICD-10-CM | POA: Diagnosis present

## 2019-03-23 DIAGNOSIS — D509 Iron deficiency anemia, unspecified: Secondary | ICD-10-CM | POA: Diagnosis present

## 2019-03-23 DIAGNOSIS — I4892 Unspecified atrial flutter: Secondary | ICD-10-CM | POA: Diagnosis present

## 2019-03-23 DIAGNOSIS — R6510 Systemic inflammatory response syndrome (SIRS) of non-infectious origin without acute organ dysfunction: Secondary | ICD-10-CM | POA: Diagnosis not present

## 2019-03-23 DIAGNOSIS — N184 Chronic kidney disease, stage 4 (severe): Secondary | ICD-10-CM | POA: Diagnosis present

## 2019-03-23 DIAGNOSIS — Z1159 Encounter for screening for other viral diseases: Secondary | ICD-10-CM | POA: Diagnosis not present

## 2019-03-23 LAB — CBC
HCT: 24.5 % — ABNORMAL LOW (ref 39.0–52.0)
Hemoglobin: 7.8 g/dL — ABNORMAL LOW (ref 13.0–17.0)
MCH: 30.5 pg (ref 26.0–34.0)
MCHC: 31.8 g/dL (ref 30.0–36.0)
MCV: 95.7 fL (ref 80.0–100.0)
Platelets: 285 10*3/uL (ref 150–400)
RBC: 2.56 MIL/uL — ABNORMAL LOW (ref 4.22–5.81)
RDW: 14.7 % (ref 11.5–15.5)
WBC: 32.3 10*3/uL — ABNORMAL HIGH (ref 4.0–10.5)
nRBC: 0 % (ref 0.0–0.2)

## 2019-03-23 LAB — BASIC METABOLIC PANEL
Anion gap: 9 (ref 5–15)
BUN: 30 mg/dL — ABNORMAL HIGH (ref 8–23)
CO2: 22 mmol/L (ref 22–32)
Calcium: 8.3 mg/dL — ABNORMAL LOW (ref 8.9–10.3)
Chloride: 104 mmol/L (ref 98–111)
Creatinine, Ser: 2.07 mg/dL — ABNORMAL HIGH (ref 0.61–1.24)
GFR calc Af Amer: 34 mL/min — ABNORMAL LOW (ref >60)
GFR calc non Af Amer: 29 mL/min — ABNORMAL LOW (ref >60)
Glucose, Bld: 103 mg/dL — ABNORMAL HIGH (ref 70–99)
Potassium: 3.7 mmol/L (ref 3.5–5.1)
Sodium: 135 mmol/L (ref 135–145)

## 2019-03-23 MED ORDER — SODIUM CHLORIDE 0.9 % IV SOLN
INTRAVENOUS | Status: AC
  Administered 2019-03-23: 15:00:00 via INTRAVENOUS

## 2019-03-23 MED ORDER — POLYETHYLENE GLYCOL 3350 17 G PO PACK
17.0000 g | PACK | Freq: Every day | ORAL | Status: DC | PRN
  Administered 2019-03-26: 17 g via ORAL
  Filled 2019-03-23: qty 1

## 2019-03-23 MED ORDER — BISACODYL 5 MG PO TBEC
5.0000 mg | DELAYED_RELEASE_TABLET | Freq: Every day | ORAL | Status: DC | PRN
  Administered 2019-03-26: 5 mg via ORAL
  Filled 2019-03-23: qty 1

## 2019-03-23 MED ORDER — APIXABAN 2.5 MG PO TABS
2.5000 mg | ORAL_TABLET | Freq: Two times a day (BID) | ORAL | Status: DC
  Administered 2019-03-23 – 2019-03-26 (×6): 2.5 mg via ORAL
  Filled 2019-03-23 (×6): qty 1

## 2019-03-23 NOTE — Progress Notes (Signed)
[facility] [place] NAME: Vincent Poole    MR#:  [id number]  DATE OF BIRTH:  08-Dec-1934  SUBJECTIVE:  CHIEF COMPLAINT:   Chief Complaint  Patient presents with  . kidney cath issue   Right-sided flank pain. REVIEW OF SYSTEMS:  Review of Systems  Constitutional: Negative for chills, fever and malaise/fatigue.  HENT: Negative for sore throat.   Eyes: Negative for blurred vision and double vision.  Respiratory: Negative for cough, hemoptysis, shortness of breath, wheezing and stridor.   Cardiovascular: Negative for chest pain, palpitations, orthopnea and leg swelling.  Gastrointestinal: Negative for abdominal pain, blood in stool, diarrhea, melena, nausea and vomiting.  Genitourinary: Positive for flank pain. Negative for dysuria and hematuria.  Musculoskeletal: Negative for back pain and joint pain.  Skin: Negative for rash.  Neurological: Negative for dizziness, sensory change, focal weakness, seizures, loss of consciousness, weakness and headaches.  Endo/Heme/Allergies: Negative for polydipsia.  Psychiatric/Behavioral: Negative for depression. The patient is not nervous/anxious.     DRUG ALLERGIES:  No Known Allergies VITALS:  Blood pressure 100/90, pulse (!) 106, temperature 99.6 F (37.6 C), temperature source Oral, resp. rate 17, height 5\' 11"  (1.803 m), weight 74.2 kg, SpO2 98 %. PHYSICAL EXAMINATION:  Physical Exam Constitutional:      Appearance: Normal appearance.  HENT:     Head: Normocephalic.     Mouth/Throat:     Mouth: Mucous membranes are moist.  Eyes:     General: No scleral icterus.    Conjunctiva/sclera: Conjunctivae normal.     Pupils: Pupils are equal, round, and reactive to light.  Neck:     Musculoskeletal: Normal range of motion and neck supple.     Vascular: No JVD.     Trachea: No tracheal deviation.  Cardiovascular:     Rate and Rhythm: Normal rate and regular rhythm.     Heart sounds: Normal  heart sounds. No murmur. No gallop.   Pulmonary:     Effort: Pulmonary effort is normal. No respiratory distress.     Breath sounds: Normal breath sounds. No wheezing or rales.  Abdominal:     General: Bowel sounds are normal. There is no distension.     Palpations: Abdomen is soft.     Tenderness: There is no abdominal tenderness. There is right CVA tenderness. There is no rebound.  Musculoskeletal: Normal range of motion.        General: No tenderness.     Right lower leg: No edema.     Left lower leg: No edema.  Skin:    Findings: No erythema or rash.  Neurological:     General: No focal deficit present.     Mental Status: He is alert and oriented to person, place, and time.     Cranial Nerves: No cranial nerve deficit.  Psychiatric:        Mood and Affect: Mood normal.    LABORATORY PANEL:  Male CBC Recent Labs  Lab 03/23/19 0506  WBC 32.3*  HGB 7.8*  HCT 24.5*  PLT 285   ------------------------------------------------------------------------------------------------------------------ Chemistries  Recent Labs  Lab 03/22/19 1731 03/23/19 0506  NA 130* 135  K 4.1 3.7  CL 99 104  CO2 21* 22  GLUCOSE 114* 103*  BUN 32* 30*  CREATININE 2.07* 2.07*  CALCIUM 8.5* 8.3*  AST 23  --   ALT 22  --   ALKPHOS 92  --   BILITOT 0.6  --    RADIOLOGY:  Ct Renal Stone Study  Result Date: 03/22/2019 CLINICAL DATA:  Left kidney catheter would not drain EXAM: CT ABDOMEN AND PELVIS WITHOUT CONTRAST TECHNIQUE: Multidetector CT imaging of the abdomen and pelvis was performed following the standard protocol without IV contrast. COMPARISON:  CT abdomen pelvis, 02/21/2019 FINDINGS: Lower chest: No acute abnormality. Three-vessel coronary artery calcifications. Hepatobiliary: No focal liver abnormality is seen. Status post cholecystectomy. No biliary dilatation. Pancreas: Unremarkable. No pancreatic ductal dilatation or surrounding inflammatory changes. Spleen: Normal in size without  focal abnormality. Adrenals/Urinary Tract: Adrenal glands are unremarkable. There has been interval placement of bilateral percutaneous nephrostomy catheters, with formed pigtails in the bilateral renal pelves. There is moderate left hydronephrosis and hydroureter, with decompression of the right collecting system. Redemonstrated, large, partially necrotic mass of the bladder. There has been interval removal of a previously seen Foley catheter. There is air and fluid within the bladder lumen. Stomach/Bowel: Stomach is within normal limits. No evidence of bowel wall thickening, distention, or inflammatory changes. Large burden of stool in the colon. Vascular/Lymphatic: Scattered calcific atherosclerosis. No enlarged abdominal or pelvic lymph nodes. Reproductive: No mass or other abnormality. Other: No abdominal wall hernia or abnormality. No abdominopelvic ascites. Musculoskeletal: No acute or significant osseous findings. Severe osteopenia. IMPRESSION: 1. There has been interval placement of bilateral percutaneous nephrostomy catheters, with formed pigtails in the bilateral renal pelves. There is moderate left hydronephrosis and hydroureter similar to prior examination, with decompression of the right collecting system. 2. Redemonstrated, large, partially necrotic mass of the bladder. There has been interval removal of a previously seen Foley catheter. There is air and fluid within the bladder lumen. Electronically Signed   By: Eddie Candle M.D.   On: 03/22/2019 19:12   ASSESSMENT AND PLAN:   Complicated UTI. Continue Zosyn.  Follow-up CBC and urine culture.    Urinary obstructio, flush nephrostomy tube, which is scheduled to be changed by IR on Monday. CKD stage IV.  Stable.   Essential hypertension -home dose antihypertensives   Malignant neoplasm of urinary bladder ([place]) -continue home meds, follow-up urologist as outpatient. Acute on chronic  Leukocytosis, IV antibiotics initially as above, follow-up  CBC. History of a flutter.  Continue Eliquis. Anemia of chronic disease.  Hemoglobin decreased to 7.8.  No active bleeding.  Follow-up hemoglobin.  All the records are reviewed and case discussed with Care Management/Social Worker. Management plans discussed with the patient, his wife and they are in agreement.  CODE STATUS: Full Code  TOTAL TIME TAKING CARE OF THIS PATIENT: 33 minutes.   More than 50% of the time was spent in counseling/coordination of care: YES  POSSIBLE D/C IN 2 DAYS, DEPENDING ON CLINICAL CONDITION.   Demetrios Loll M.D on 03/23/2019 at 4:33 PM  Between 7am to 6pm - Pager - [phone]  After 6pm go to [email or web address] - [facility]

## 2019-03-23 NOTE — Consult Note (Signed)
Consultation: Left hydronephrosis, tachycardia, leukocytosis Requested by: Dr. Charlotte Crumb  History of Present Illness: Vincent Poole is an 84 year old male with a history of T4 high-grade urothelial carcinoma with extensive necrosisinvolving both the bladder and the prostate.He is cared for by Dr. Erlene Quan and Dr. [name].  He was treated with chemo and radiation after TURP/TURBT and is now on T centric.  He recently underwent bilateral nephrostomy tube placement and ureteral stent removal in the office.  He has been struggling over the past few weeks with activity fading.  Dr. [name] spoke to them about hospice at the last appointment.  He was brought in because the left nephrostomy tube was not draining well and he may have been leaking around it.  CT scan of the abdomen and pelvis was obtained which revealed moderate left hydro-, but both nephrostomy tubes were in good position.  They were scheduled to be changed Monday.  The left nephrostomy tube was flushed in the emergency room and appears to be draining well today.  The patient was hydrated and on Zosyn.  Cultures pending.  There is a tray of food in his room but it does not look like he is eating much of it at all.  He spoke to Dr. Erlene Quan with a telehealth visit the other day and had some purulent discharge per urethra.  Patient has had a chronic leukocytosis and tachycardia.  There may be some clot or debris in the bladder on the CT but it looks a little bit better than the scan last month.  The patient complains of occasional bladder spasm.  Past Medical History:  Diagnosis Date  . Anxiety   . Arthritis   . Bladder cancer ([place])   . Dysrhythmia 07/2018   history of atrial flutter that worsens with anxiety  . Femur fracture, right ([place]) 05/01/2018  . GERD (gastroesophageal reflux disease)   . History of recent blood transfusion 05/2018  . Hypertension   . Iron deficiency anemia 09/14/2018  . Prostate cancer ([place]) 07/2018   cancer growing in  prostate but not prostate cancer, it is from the bladder  . Umbilical hernia 54/0086  . Urinary retention 2019   foley catheter place 11/2017  . UTI (urinary tract infection) 2019   frequent UTI's over last year  . Wound eschar of foot 07/2018   left heal getting wrapped and requiring antibiotic cream. cracks open with weight bearing.   Past Surgical History:  Procedure Laterality Date  . CARPAL TUNNEL RELEASE Right   . CHOLECYSTECTOMY  2004  . CYSTOGRAM  07/18/2018   Procedure: CYSTOGRAM;  Surgeon: [name], MD;  Location: [facility];  Service: Urology;;  . Consuela Mimes W/ RETROGRADES Bilateral 07/18/2018   Procedure: CYSTOSCOPY WITH RETROGRADE PYELOGRAM;  Surgeon: [name], MD;  Location: [facility];  Service: Urology;  Laterality: Bilateral;  . CYSTOSCOPY W/ URETERAL STENT PLACEMENT Bilateral 12/27/2017   Procedure: CYSTOSCOPY WITH RETROGRADE PYELOGRAM/URETERAL STENT PLACEMENT;  Surgeon: [name], MD;  Location: [facility];  Service: Urology;  Laterality: Bilateral;  . CYSTOSCOPY W/ URETERAL STENT PLACEMENT Bilateral 07/18/2018   Procedure: CYSTOSCOPY WITH STENT REPLACEMENT (exchange);  Surgeon: [name], MD;  Location: [facility];  Service: Urology;  Laterality: Bilateral;  . CYSTOSCOPY WITH STENT PLACEMENT Bilateral 11/12/2018   Procedure: [place] WITH STENT Exchange;  Surgeon: [name], MD;  Location: [facility];  Service: Urology;  Laterality: Bilateral;  . INTRAMEDULLARY (IM) NAIL INTERTROCHANTERIC Right 05/02/2018   Procedure: INTRAMEDULLARY (IM) NAIL INTERTROCHANTRIC;  Surgeon: [name], MD;  Location: [facility];  Service: Orthopedics;  Laterality: Right;  . IR NEPHROSTOMY PLACEMENT LEFT  02/23/2019  . IR NEPHROSTOMY PLACEMENT RIGHT  02/23/2019  . LEG TENDON SURGERY Right 1958  . PORTA CATH INSERTION N/A 01/22/2018   Procedure: PORTA CATH INSERTION;  Surgeon: [name], MD;  Location: [place] [facility];  Service: Cardiovascular;  Laterality: N/A;  .  TRANSURETHRAL RESECTION OF BLADDER TUMOR N/A 12/27/2017   Procedure: TRANSURETHRAL RESECTION OF BLADDER TUMOR (TURBT);  Surgeon: [name], MD;  Location: [facility];  Service: Urology;  Laterality: N/A;  . TRANSURETHRAL RESECTION OF BLADDER TUMOR N/A 01/15/2018   Procedure: TRANSURETHRAL RESECTION OF BLADDER TUMOR (TURBT);  Surgeon: [name], MD;  Location: [facility];  Service: Urology;  Laterality: N/A;  Need 2 hrs for this case please  . TRANSURETHRAL RESECTION OF BLADDER TUMOR N/A 07/18/2018   Procedure: TRANSURETHRAL RESECTION OF BLADDER TUMOR (TURBT);  Surgeon: [name], MD;  Location: [facility];  Service: Urology;  Laterality: N/A;    Home Medications:  Medications Prior to Admission  Medication Sig Dispense Refill Last Dose  . cetirizine (ZYRTEC) 10 MG tablet Take 10 mg by mouth daily as needed for allergies.    03/22/2019 at 0900  . Cranberry-Cholecalciferol (SUPER CRANBERRY/VITAMIN D3) 4200-500 MG-UNIT CAPS Take 1 capsule by mouth 2 (two) times daily.   03/22/2019 at 0900  . ELIQUIS 5 MG TABS tablet TAKE ONE TABLET BY MOUTH TWICE DAILY (Patient taking differently: Take 5 mg by mouth 2 (two) times daily. ) 60 tablet 3 03/22/2019 at 0900  . ferrous sulfate 325 (65 FE) MG tablet Take 1 tablet (325 mg total) by mouth 2 (two) times daily with a meal.  3 03/21/2019 at 0900  . Multiple Vitamin (MULTIVITAMIN WITH MINERALS) TABS tablet Take 1 tablet by mouth daily.   03/22/2019 at 0900  . MYRBETRIQ 50 MG TB24 tablet TAKE ONE TABLET EVERY DAY (Patient taking differently: Take 50 mg by mouth daily. ) 30 tablet 3 03/22/2019 at 0900  . oxyCODONE (OXY IR/ROXICODONE) 5 MG immediate release tablet Take 1 tablet (5 mg total) by mouth every 4 (four) hours as needed for severe pain. 60 tablet 0 03/22/2019 at 1400  . potassium chloride SA (K-DUR) 20 MEQ tablet Take 20 mEq by mouth 2 (two) times daily.   03/22/2019 at 0900  . vitamin B-12 (CYANOCOBALAMIN) 500 MCG tablet Take 500 mcg by mouth daily.    03/22/2019 at 0900  . acetaminophen (TYLENOL) 500 MG tablet Take 1,000 mg by mouth every 6 (six) hours as needed for moderate pain or headache.    Taking  . lidocaine-prilocaine (EMLA) cream Apply 1 application topically as needed. 30 g 1 prn at prn  . nystatin [facility]) powder Apply topically 2 (two) times daily. 60 g 3 Taking  . polyethylene glycol powder (MIRALAX) powder Take 17 g by mouth daily as needed. Can increase to 3 times a day as needed for constipation but hold medication if has diarrhea 255 g 0 prn at prn  . zinc oxide 20 % ointment Apply 1 application topically as needed for irritation.   prn at prn   Allergies: No Known Allergies  Family History  Problem Relation Age of Onset  . Cancer Mother   . Chronic Renal Failure Mother   . Heart disease Father    Social History:  reports that he has never smoked. He has never used smokeless tobacco. He reports that he does not drink alcohol or use drugs.  ROS:  A complete review of systems was performed.  All systems are negative except for pertinent findings as noted. Review of Systems  Constitutional: Positive for malaise/fatigue and weight loss.     Physical Exam:  Vital signs in last 24 hours: Temp:  [98.3 F (36.8 C)-99.6 F (37.6 C)] 99.6 F (37.6 C) (05/16 1453) Pulse Rate:  [58-134] 106 (05/16 1453) Resp:  [17-29] 17 (05/16 1453) BP: (100-120)/(57-90) 100/90 (05/16 1453) SpO2:  [95 %-100 %] 98 % (05/16 1453) Weight:  [74.2 kg-77.1 kg] 74.2 kg (05/15 2320)    Intake/Output Summary (Last 24 hours) at 03/23/2019 1656 Last data filed at 03/23/2019 1525 Gross per 24 hour  Intake 1543.45 ml  Output 1350 ml  Net 193.45 ml     General:  Alert and oriented, No acute distress, cachectic appearing Cardiovascular: Regular rate and rhythm Lungs: Regular rate and effort Abdomen: Soft, nontender, nondistended, no abdominal masses, bladder mildly distended  Back: No CVA tenderness --both nephrostomy tube bags are full of  clear yellow urine.  He appears to have good urine output. Extremities: No edema Neurologic: Grossly intact GU: Penis appears normal, some mucousy discharge per urethra which is not uncommon.  Scrotum appeared normal.  Laboratory Data:  Results for orders placed or performed during the hospital encounter of 03/22/19 (from the past 24 hour(s))  Comprehensive metabolic panel     Status: Abnormal   Collection Time: 03/22/19  5:31 PM  Result Value Ref Range   Sodium 130 (L) 135 - 145 mmol/L   Potassium 4.1 3.5 - 5.1 mmol/L   Chloride 99 98 - 111 mmol/L   CO2 21 (L) 22 - 32 mmol/L   Glucose, Bld 114 (H) 70 - 99 mg/dL   BUN 32 (H) 8 - 23 mg/dL   Creatinine, Ser 2.07 (H) 0.61 - 1.24 mg/dL   Calcium 8.5 (L) 8.9 - 10.3 mg/dL   Total Protein 6.8 6.5 - 8.1 g/dL   Albumin 2.5 (L) 3.5 - 5.0 g/dL   AST 23 15 - 41 U/L   ALT 22 0 - 44 U/L   Alkaline Phosphatase 92 38 - 126 U/L   Total Bilirubin 0.6 0.3 - 1.2 mg/dL   GFR calc non Af Amer 29 (L) >60 mL/min   GFR calc Af Amer 34 (L) >60 mL/min   Anion gap 10 5 - 15  Lactic acid, plasma     Status: None   Collection Time: 03/22/19  5:31 PM  Result Value Ref Range   Lactic Acid, Venous 1.7 0.5 - 1.9 mmol/L  CBC with Differential     Status: Abnormal   Collection Time: 03/22/19  5:31 PM  Result Value Ref Range   WBC 36.8 (H) 4.0 - 10.5 K/uL   RBC 2.83 (L) 4.22 - 5.81 MIL/uL   Hemoglobin 8.7 (L) 13.0 - 17.0 g/dL   HCT 27.1 (L) 39.0 - 52.0 %   MCV 95.8 80.0 - 100.0 fL   MCH 30.7 26.0 - 34.0 pg   MCHC 32.1 30.0 - 36.0 g/dL   RDW 14.9 11.5 - 15.5 %   Platelets 355 150 - 400 K/uL   nRBC 0.0 0.0 - 0.2 %   Neutrophils Relative % 83 %   Neutro Abs 30.8 (H) 1.7 - 7.7 K/uL   Lymphocytes Relative 6 %   Lymphs Abs 2.1 0.7 - 4.0 K/uL   Monocytes Relative 7 %   Monocytes Absolute 2.5 (H) 0.1 - 1.0 K/uL   Eosinophils Relative 2 %  Eosinophils Absolute 0.6 (H) 0.0 - 0.5 K/uL   Basophils Relative 0 %   Basophils Absolute 0.2 (H) 0.0 - 0.1 K/uL    Immature Granulocytes 2 %   Abs Immature Granulocytes 0.85 (H) 0.00 - 0.07 K/uL  Protime-INR     Status: Abnormal   Collection Time: 03/22/19  5:31 PM  Result Value Ref Range   Prothrombin Time 21.5 (H) 11.4 - 15.2 seconds   INR 1.9 (H) 0.8 - 1.2  Culture, blood (routine x 2)     Status: None (Preliminary result)   Collection Time: 03/22/19  5:32 PM  Result Value Ref Range   Specimen Description BLOOD LFA    Special Requests      BOTTLES DRAWN AEROBIC AND ANAEROBIC Blood Culture results may not be optimal due to an excessive volume of blood received in culture bottles   Culture      NO GROWTH < 12 HOURS Performed at [facility], [address] [place], [place] [address]    Report Status PENDING   Urinalysis, Complete w Microscopic     Status: Abnormal   Collection Time: 03/22/19  5:55 PM  Result Value Ref Range   Color, Urine YELLOW (A) YELLOW   APPearance CLOUDY (A) CLEAR   Specific Gravity, Urine 1.011 1.005 - 1.030   pH 5.0 5.0 - 8.0   Glucose, UA NEGATIVE NEGATIVE mg/dL   Hgb urine dipstick MODERATE (A) NEGATIVE   Bilirubin Urine NEGATIVE NEGATIVE   Ketones, ur NEGATIVE NEGATIVE mg/dL   Protein, ur 30 (A) NEGATIVE mg/dL   Nitrite NEGATIVE NEGATIVE   Leukocytes,Ua MODERATE (A) NEGATIVE   RBC / HPF 6-10 0 - 5 RBC/hpf   WBC, UA >50 (H) 0 - 5 WBC/hpf   Bacteria, UA MANY (A) NONE SEEN   Squamous Epithelial / LPF 0-5 0 - 5   Mucus PRESENT    Budding Yeast PRESENT    RBC Casts, UA PRESENT   Culture, blood (routine x 2)     Status: None (Preliminary result)   Collection Time: 03/22/19  7:41 PM  Result Value Ref Range   Specimen Description BLOOD RIGHT FOREARM    Special Requests      BOTTLES DRAWN AEROBIC AND ANAEROBIC Blood Culture adequate volume   Culture      NO GROWTH < 12 HOURS Performed at [facility], [address] [place], [place] [address]    Report Status PENDING   SARS Coronavirus 2 (CEPHEID - Performed in [place] hospital lab),  Hosp Order     Status: None   Collection Time: 03/22/19  8:45 PM  Result Value Ref Range   SARS Coronavirus 2 NEGATIVE NEGATIVE  Urinalysis, Complete w Microscopic     Status: Abnormal   Collection Time: 03/22/19  8:45 PM  Result Value Ref Range   Color, Urine YELLOW (A) YELLOW   APPearance CLOUDY (A) CLEAR   Specific Gravity, Urine 1.009 1.005 - 1.030   pH 5.0 5.0 - 8.0   Glucose, UA NEGATIVE NEGATIVE mg/dL   Hgb urine dipstick LARGE (A) NEGATIVE   Bilirubin Urine NEGATIVE NEGATIVE   Ketones, ur NEGATIVE NEGATIVE mg/dL   Protein, ur 30 (A) NEGATIVE mg/dL   Nitrite NEGATIVE NEGATIVE   Leukocytes,Ua LARGE (A) NEGATIVE   RBC / HPF >50 (H) 0 - 5 RBC/hpf   WBC, UA 21-50 0 - 5 WBC/hpf   Bacteria, UA MANY (A) NONE SEEN   Squamous Epithelial / LPF 0-5 0 - 5  WBC Clumps PRESENT    Mucus PRESENT    Budding Yeast PRESENT    Granular Casts, UA PRESENT    Amorphous Crystal PRESENT   Basic metabolic panel     Status: Abnormal   Collection Time: 03/23/19  5:06 AM  Result Value Ref Range   Sodium 135 135 - 145 mmol/L   Potassium 3.7 3.5 - 5.1 mmol/L   Chloride 104 98 - 111 mmol/L   CO2 22 22 - 32 mmol/L   Glucose, Bld 103 (H) 70 - 99 mg/dL   BUN 30 (H) 8 - 23 mg/dL   Creatinine, Ser 2.07 (H) 0.61 - 1.24 mg/dL   Calcium 8.3 (L) 8.9 - 10.3 mg/dL   GFR calc non Af Amer 29 (L) >60 mL/min   GFR calc Af Amer 34 (L) >60 mL/min   Anion gap 9 5 - 15  CBC     Status: Abnormal   Collection Time: 03/23/19  5:06 AM  Result Value Ref Range   WBC 32.3 (H) 4.0 - 10.5 K/uL   RBC 2.56 (L) 4.22 - 5.81 MIL/uL   Hemoglobin 7.8 (L) 13.0 - 17.0 g/dL   HCT 24.5 (L) 39.0 - 52.0 %   MCV 95.7 80.0 - 100.0 fL   MCH 30.5 26.0 - 34.0 pg   MCHC 31.8 30.0 - 36.0 g/dL   RDW 14.7 11.5 - 15.5 %   Platelets 285 150 - 400 K/uL   nRBC 0.0 0.0 - 0.2 %   Recent Results (from the past 240 hour(s))  Culture, blood (routine x 2)     Status: None (Preliminary result)   Collection Time: 03/22/19  5:32 PM  Result  Value Ref Range Status   Specimen Description BLOOD LFA  Final   Special Requests   Final    BOTTLES DRAWN AEROBIC AND ANAEROBIC Blood Culture results may not be optimal due to an excessive volume of blood received in culture bottles   Culture   Final    NO GROWTH < 12 HOURS Performed at [facility], [address] [place], [place] [address]    Report Status PENDING  Incomplete  Culture, blood (routine x 2)     Status: None (Preliminary result)   Collection Time: 03/22/19  7:41 PM  Result Value Ref Range Status   Specimen Description BLOOD RIGHT FOREARM  Final   Special Requests   Final    BOTTLES DRAWN AEROBIC AND ANAEROBIC Blood Culture adequate volume   Culture   Final    NO GROWTH < 12 HOURS Performed at [facility], [address] [place], [place] [address]    Report Status PENDING  Incomplete  SARS Coronavirus 2 (CEPHEID - Performed in [place] hospital lab), Hosp Order     Status: None   Collection Time: 03/22/19  8:45 PM  Result Value Ref Range Status   SARS Coronavirus 2 NEGATIVE NEGATIVE Final    Comment: (NOTE) If result is NEGATIVE SARS-CoV-2 target nucleic acids are NOT DETECTED. The SARS-CoV-2 RNA is generally detectable in upper and lower  respiratory specimens during the acute phase of infection. The lowest  concentration of SARS-CoV-2 viral copies this assay can detect is 250  copies / mL. A negative result does not preclude SARS-CoV-2 infection  and should not be used as the sole basis for treatment or other  patient management decisions.  A negative result may occur with  improper specimen collection / handling, submission of specimen other  than nasopharyngeal swab,  presence of viral mutation(s) within the  areas targeted by this assay, and inadequate number of viral copies  (<250 copies / mL). A negative result must be combined with clinical  observations, patient history, and epidemiological information. If result is  POSITIVE SARS-CoV-2 target nucleic acids are DETECTED. The SARS-CoV-2 RNA is generally detectable in upper and lower  respiratory specimens dur ing the acute phase of infection.  Positive  results are indicative of active infection with SARS-CoV-2.  Clinical  correlation with patient history and other diagnostic information is  necessary to determine patient infection status.  Positive results do  not rule out bacterial infection or co-infection with other viruses. If result is PRESUMPTIVE POSTIVE SARS-CoV-2 nucleic acids MAY BE PRESENT.   A presumptive positive result was obtained on the submitted specimen  and confirmed on repeat testing.  While 2019 novel coronavirus  (SARS-CoV-2) nucleic acids may be present in the submitted sample  additional confirmatory testing may be necessary for epidemiological  and / or clinical management purposes  to differentiate between  SARS-CoV-2 and other Sarbecovirus currently known to infect humans.  If clinically indicated additional testing with an alternate test  methodology [id number]) is advised. The SARS-CoV-2 RNA is generally  detectable in upper and lower respiratory sp ecimens during the acute  phase of infection. The expected result is Negative. Fact Sheet for Patients:  StrictlyIdeas.no Fact Sheet for Healthcare Providers: BankingDealers.co.za This test is not yet approved or cleared by the [place] FDA and has been authorized for detection and/or diagnosis of SARS-CoV-2 by FDA under an Emergency Use Authorization (EUA).  This EUA will remain in effect (meaning this test can be used) for the duration of the COVID-19 declaration under Section 564(b)(1) of the Act, 21 U.S.C. section 360bbb-3(b)(1), unless the authorization is terminated or revoked sooner. Performed at [facility], [address] [place], [place] [address]    Creatinine: Recent Labs    03/21/19 0913  03/22/19 1731 03/23/19 0506  CREATININE 1.86* 2.07* 2.07*   I reviewed the patient's CT scan images as well as his prior CT scan images.  I reviewed multiple office and hospital notes.  Impression/Assessment/plan::  #1 bilateral ureteral obstruction- nephrostomy tubes appear to be draining normally now.  He can go home and come back for nephrostomy change Monday or simply stay until Monday.  Interventional should be notified Monday if he stays. I did put in an order for IR if he stays. Good UOP at this point.   #2 leukocytosis-tachycardia-no obvious infection source.  He does have mucousy discharge per urethra and we talked about placing a catheter for a quick bladder irrigation and then removing it.  We discussed there could be infection or urine in his bladder.  He adamantly refused the catheter.  I told him I could put it in irrigate and then take it out but again he declined.  I spoke to nurse and she said he is pretty comfortable and not been in a lot of pain.  #3 bladder cancer- he continues atezolizumab with Dr. [name].  It appears he overall is declining and hospice is being considered.  Will sign off but please page GU with any questions, concerns or changes in pt status.   Festus Aloe 03/23/2019, 4:42 PM

## 2019-03-24 DIAGNOSIS — D72829 Elevated white blood cell count, unspecified: Secondary | ICD-10-CM

## 2019-03-24 DIAGNOSIS — N1330 Unspecified hydronephrosis: Secondary | ICD-10-CM

## 2019-03-24 DIAGNOSIS — R000 Tachycardia, unspecified: Secondary | ICD-10-CM

## 2019-03-24 LAB — URINE CULTURE: Culture: >=100000 — AB

## 2019-03-24 LAB — CBC
HCT: 25 % — ABNORMAL LOW (ref 39.0–52.0)
Hemoglobin: 8 g/dL — ABNORMAL LOW (ref 13.0–17.0)
MCH: 30.7 pg (ref 26.0–34.0)
MCHC: 32 g/dL (ref 30.0–36.0)
MCV: 95.8 fL (ref 80.0–100.0)
Platelets: 313 10*3/uL (ref 150–400)
RBC: 2.61 MIL/uL — ABNORMAL LOW (ref 4.22–5.81)
RDW: 14.7 % (ref 11.5–15.5)
WBC: 31.7 10*3/uL — ABNORMAL HIGH (ref 4.0–10.5)
nRBC: 0 % (ref 0.0–0.2)

## 2019-03-24 LAB — BASIC METABOLIC PANEL
Anion gap: 8 (ref 5–15)
BUN: 27 mg/dL — ABNORMAL HIGH (ref 8–23)
CO2: 23 mmol/L (ref 22–32)
Calcium: 8.4 mg/dL — ABNORMAL LOW (ref 8.9–10.3)
Chloride: 104 mmol/L (ref 98–111)
Creatinine, Ser: 1.9 mg/dL — ABNORMAL HIGH (ref 0.61–1.24)
GFR calc Af Amer: 37 mL/min — ABNORMAL LOW (ref >60)
GFR calc non Af Amer: 32 mL/min — ABNORMAL LOW (ref >60)
Glucose, Bld: 93 mg/dL (ref 70–99)
Potassium: 3.9 mmol/L (ref 3.5–5.1)
Sodium: 135 mmol/L (ref 135–145)

## 2019-03-24 NOTE — Progress Notes (Signed)
[facility] NAME: Vincent Poole    MR#:  [id number]  DATE OF BIRTH:  August 03, 1935  SUBJECTIVE:  CHIEF COMPLAINT:   Chief Complaint  Patient presents with  . kidney cath issue   Better right-sided flank pain.  The patient has lower abdominal pain just now, bladder scan showed 450 cc urine. REVIEW OF SYSTEMS:  Review of Systems  Constitutional: Negative for chills, fever and malaise/fatigue.  HENT: Negative for sore throat.   Eyes: Negative for blurred vision and double vision.  Respiratory: Negative for cough, hemoptysis, shortness of breath, wheezing and stridor.   Cardiovascular: Negative for chest pain, palpitations, orthopnea and leg swelling.  Gastrointestinal: Positive for abdominal pain. Negative for blood in stool, diarrhea, melena, nausea and vomiting.  Genitourinary: Positive for flank pain. Negative for dysuria and hematuria.  Musculoskeletal: Negative for back pain and joint pain.  Skin: Negative for rash.  Neurological: Negative for dizziness, sensory change, focal weakness, seizures, loss of consciousness, weakness and headaches.  Endo/Heme/Allergies: Negative for polydipsia.  Psychiatric/Behavioral: Negative for depression. The patient is not nervous/anxious.     DRUG ALLERGIES:  No Known Allergies VITALS:  Blood pressure (!) 95/52, pulse (!) 110, temperature 99.8 F (37.7 C), temperature source Oral, resp. rate 18, height 5\' 11"  (1.803 m), weight 74.2 kg, SpO2 97 %. PHYSICAL EXAMINATION:  Physical Exam Constitutional:      Appearance: Normal appearance.  HENT:     Head: Normocephalic.     Mouth/Throat:     Mouth: Mucous membranes are moist.  Eyes:     General: No scleral icterus.    Conjunctiva/sclera: Conjunctivae normal.     Pupils: Pupils are equal, round, and reactive to light.  Neck:     Musculoskeletal: Normal range of motion and neck supple.     Vascular: No JVD.     Trachea: No tracheal  deviation.  Cardiovascular:     Rate and Rhythm: Normal rate and regular rhythm.     Heart sounds: Normal heart sounds. No murmur. No gallop.   Pulmonary:     Effort: Pulmonary effort is normal. No respiratory distress.     Breath sounds: Normal breath sounds. No wheezing or rales.  Abdominal:     General: Bowel sounds are normal. There is no distension.     Palpations: Abdomen is soft.     Tenderness: There is no abdominal tenderness. There is no right CVA tenderness or rebound.  Musculoskeletal: Normal range of motion.        General: No tenderness.     Right lower leg: No edema.     Left lower leg: No edema.  Skin:    Findings: No erythema or rash.  Neurological:     General: No focal deficit present.     Mental Status: He is alert and oriented to person, place, and time.     Cranial Nerves: No cranial nerve deficit.  Psychiatric:        Mood and Affect: Mood normal.    LABORATORY PANEL:  Male CBC Recent Labs  Lab 03/24/19 0552  WBC 31.7*  HGB 8.0*  HCT 25.0*  PLT 313   ------------------------------------------------------------------------------------------------------------------ Chemistries  Recent Labs  Lab 03/22/19 1731  03/24/19 0552  NA 130*   < > 135  K 4.1   < > 3.9  CL 99   < > 104  CO2 21*   < > 23  GLUCOSE 114*   < > 93  BUN 32*   < > 27*  CREATININE 2.07*   < > 1.90*  CALCIUM 8.5*   < > 8.4*  AST 23  --   --   ALT 22  --   --   ALKPHOS 92  --   --   BILITOT 0.6  --   --    < > = values in this interval not displayed.   RADIOLOGY:  No results found. ASSESSMENT AND PLAN:   Complicated UTI. Continue Zosyn.  Follow-up CBC and urine culture: >=100,000 COLONIES/mL SERRATIA MARCESCENS; >=100,000 COLONIES/mL KLEBSIELLA OXYTOCA .    Urinary obstruction, flush nephrostomy tube, which is scheduled to be changed by IR on tomorrow.  Urinary retention.  Urine and out caths per Dr. Patsy Poole.  CKD stage IV.  Stable.   Essential hypertension -hold  home dose antihypertensives since blood pressure is soft.   Malignant neoplasm of urinary bladder ([place]) -continue home meds, follow-up urologist as outpatient. Acute on chronic  Leukocytosis, IV antibiotics initially as above, follow-up CBC. History of a flutter.  Continue Eliquis. Anemia of chronic disease.  Hemoglobin decreased to 7.8.  No active bleeding.  Follow-up hemoglobin 8.0. I discussed with Dr. Patsy Poole. All the records are reviewed and case discussed with Care Management/Social Worker. Management plans discussed with the patient, his wife and they are in agreement.  CODE STATUS: Full Code  TOTAL TIME TAKING CARE OF THIS PATIENT: 27 minutes.   More than 50% of the time was spent in counseling/coordination of care: YES  POSSIBLE D/C IN 1-2 DAYS, DEPENDING ON CLINICAL CONDITION.   Vincent Poole M.D on 03/24/2019 at 3:15 PM  Between 7am to 6pm - Pager - [phone]  After 6pm go to [email or web address] - [facility]

## 2019-03-24 NOTE — Progress Notes (Signed)
Talked to Dr. Junious Silk about the in and out cath being very thick, brown, and malodorous.  He voiced acknowledgement and stated that's what he expected.  He gave a verbal order for PRN I&O caths.

## 2019-03-24 NOTE — Consult Note (Signed)
Pharmacy Antibiotic Note  Vincent Poole is a 83 y.o. male admitted on 1/69/6789 with complicated UTI.  Pharmacy has been consulted for Zosyn dosing. Ucx growing > 100,000 GNR.   Plan: Continue Zosyn 3.375 IV EI every 8 hours   Height: 5\' 11"  (180.3 cm) Weight: 163 lb 9.3 oz (74.2 kg) IBW/kg (Calculated) : 75.3  Temp (24hrs), Avg:98.9 F (37.2 C), Min:98.4 F (36.9 C), Max:99.6 F (37.6 C)  Recent Labs  Lab 03/21/19 0913 03/22/19 1731 03/23/19 0506 03/24/19 0552  WBC 41.8* 36.8* 32.3* 31.7*  CREATININE 1.86* 2.07* 2.07* 1.90*  LATICACIDVEN  --  1.7  --   --     Estimated Creatinine Clearance: 32 mL/min (A) (by C-G formula based on SCr of 1.9 mg/dL (H)).    No Known Allergies  Antimicrobials this admission: 5/15 Zosyn >>   Microbiology results: 5/15 BCx: pending 5/15 UCx: GNR 5/15 SARS Coronavirus 2  Thank you for allowing pharmacy to be a part of this patient's care.  Oswald Hillock, PharmD, BCPS Clinical Pharmacist 03/24/2019 10:47 AM

## 2019-03-24 NOTE — Progress Notes (Signed)
Subjective: Patient reports he feels better after I&O cath. He is resting comfortably.   Objective: Vital signs in last 24 hours: Temp:  [98.4 F (36.9 C)-99.8 F (37.7 C)] 99.8 F (37.7 C) (05/17 1157) Pulse Rate:  [100-110] 110 (05/17 1157) Resp:  [18-20] 18 (05/17 0437) BP: (95-117)/(52-59) 95/52 (05/17 1157) SpO2:  [97 %-99 %] 97 % (05/17 1157)  Intake/Output from previous day: 05/16 0701 - 05/17 0700 In: 942.3 [I.V.:872.2; IV Piggyback:70.1] Out: 1625 [Urine:1625] Intake/Output this shift: Total I/O In: 28.1 [IV Piggyback:28.1] Out: 900 [Urine:900]  Physical Exam:  NAD Resting  Abd - soft, NT GU - brown leakage from penis   Lab Results: Recent Labs    03/22/19 1731 03/23/19 0506 03/24/19 0552  HGB 8.7* 7.8* 8.0*  HCT 27.1* 24.5* 25.0*   BMET Recent Labs    03/23/19 0506 03/24/19 0552  NA 135 135  K 3.7 3.9  CL 104 104  CO2 22 23  GLUCOSE 103* 93  BUN 30* 27*  CREATININE 2.07* 1.90*  CALCIUM 8.3* 8.4*   Recent Labs    03/22/19 1731  INR 1.9*   No results for input(s): LABURIN in the last 72 hours. Results for orders placed or performed during the hospital encounter of 03/22/19  Culture, blood (routine x 2)     Status: None (Preliminary result)   Collection Time: 03/22/19  5:32 PM  Result Value Ref Range Status   Specimen Description BLOOD LFA  Final   Special Requests   Final    BOTTLES DRAWN AEROBIC AND ANAEROBIC Blood Culture results may not be optimal due to an excessive volume of blood received in culture bottles   Culture   Final    NO GROWTH 2 DAYS Performed at [facility], [address] [place], [place] [address]    Report Status PENDING  Incomplete  Urine culture     Status: Abnormal   Collection Time: 03/22/19  5:55 PM  Result Value Ref Range Status   Specimen Description   Final    URINE, RANDOM Performed at [facility], [address], [place], [place] [address]    Special Requests   Final     NONE Performed at [facility], [address] [place],  [address]    Culture (A)  Final    >=100,000 COLONIES/mL MULTIPLE SPECIES PRESENT, SUGGEST RECOLLECTION   Report Status 03/24/2019 FINAL  Final  Culture, blood (routine x 2)     Status: None (Preliminary result)   Collection Time: 03/22/19  7:41 PM  Result Value Ref Range Status   Specimen Description BLOOD RIGHT FOREARM  Final   Special Requests   Final    BOTTLES DRAWN AEROBIC AND ANAEROBIC Blood Culture adequate volume   Culture   Final    NO GROWTH 2 DAYS Performed at [facility], [address] [place],  [address]    Report Status PENDING  Incomplete  SARS Coronavirus 2 (CEPHEID - Performed in [place] hospital lab), Hosp Order     Status: None   Collection Time: 03/22/19  8:45 PM  Result Value Ref Range Status   SARS Coronavirus 2 NEGATIVE NEGATIVE Final    Comment: (NOTE) If result is NEGATIVE SARS-CoV-2 target nucleic acids are NOT DETECTED. The SARS-CoV-2 RNA is generally detectable in upper and lower  respiratory specimens during the acute phase of infection. The lowest  concentration of SARS-CoV-2 viral copies this assay can detect is 250  copies / mL. A  negative result does not preclude SARS-CoV-2 infection  and should not be used as the sole basis for treatment or other  patient management decisions.  A negative result may occur with  improper specimen collection / handling, submission of specimen other  than nasopharyngeal swab, presence of viral mutation(s) within the  areas targeted by this assay, and inadequate number of viral copies  (<250 copies / mL). A negative result must be combined with clinical  observations, patient history, and epidemiological information. If result is POSITIVE SARS-CoV-2 target nucleic acids are DETECTED. The SARS-CoV-2 RNA is generally detectable in upper and lower  respiratory specimens dur ing the acute phase of infection.  Positive   results are indicative of active infection with SARS-CoV-2.  Clinical  correlation with patient history and other diagnostic information is  necessary to determine patient infection status.  Positive results do  not rule out bacterial infection or co-infection with other viruses. If result is PRESUMPTIVE POSTIVE SARS-CoV-2 nucleic acids MAY BE PRESENT.   A presumptive positive result was obtained on the submitted specimen  and confirmed on repeat testing.  While 2019 novel coronavirus  (SARS-CoV-2) nucleic acids may be present in the submitted sample  additional confirmatory testing may be necessary for epidemiological  and / or clinical management purposes  to differentiate between  SARS-CoV-2 and other Sarbecovirus currently known to infect humans.  If clinically indicated additional testing with an alternate test  methodology [phone]) is advised. The SARS-CoV-2 RNA is generally  detectable in upper and lower respiratory sp ecimens during the acute  phase of infection. The expected result is Negative. Fact Sheet for Patients:  StrictlyIdeas.no Fact Sheet for Healthcare Providers: BankingDealers.co.za This test is not yet approved or cleared by the [place] FDA and has been authorized for detection and/or diagnosis of SARS-CoV-2 by FDA under an Emergency Use Authorization (EUA).  This EUA will remain in effect (meaning this test can be used) for the duration of the COVID-19 declaration under Section 564(b)(1) of the Act, 21 U.S.C. section 360bbb-3(b)(1), unless the authorization is terminated or revoked sooner. Performed at [facility], [address], [place], [place] [address]   Urine culture     Status: Abnormal (Preliminary result)   Collection Time: 03/22/19  8:45 PM  Result Value Ref Range Status   Specimen Description   Final    URINE, RANDOM LEFT NEPH Performed at [facility], [address] [place], [place] [address]    Special Requests   Final    NONE Performed at [facility], [address], [place],  [address]    Culture (A)  Final    >=100,000 COLONIES/mL SERRATIA MARCESCENS >=100,000 COLONIES/mL KLEBSIELLA OXYTOCA    Report Status PENDING  Incomplete    Studies/Results: Ct Renal Stone Study  Result Date: 03/22/2019 CLINICAL DATA:  Left kidney catheter would not drain EXAM: CT ABDOMEN AND PELVIS WITHOUT CONTRAST TECHNIQUE: Multidetector CT imaging of the abdomen and pelvis was performed following the standard protocol without IV contrast. COMPARISON:  CT abdomen pelvis, 02/21/2019 FINDINGS: Lower chest: No acute abnormality. Three-vessel coronary artery calcifications. Hepatobiliary: No focal liver abnormality is seen. Status post cholecystectomy. No biliary dilatation. Pancreas: Unremarkable. No pancreatic ductal dilatation or surrounding inflammatory changes. Spleen: Normal in size without focal abnormality. Adrenals/Urinary Tract: Adrenal glands are unremarkable. There has been interval placement of bilateral percutaneous nephrostomy catheters, with formed pigtails in the bilateral renal pelves. There is moderate left hydronephrosis and hydroureter, with decompression of the right  collecting system. Redemonstrated, large, partially necrotic mass of the bladder. There has been interval removal of a previously seen Foley catheter. There is air and fluid within the bladder lumen. Stomach/Bowel: Stomach is within normal limits. No evidence of bowel wall thickening, distention, or inflammatory changes. Large burden of stool in the colon. Vascular/Lymphatic: Scattered calcific atherosclerosis. No enlarged abdominal or pelvic lymph nodes. Reproductive: No mass or other abnormality. Other: No abdominal wall hernia or abnormality. No abdominopelvic ascites. Musculoskeletal: No acute or significant osseous findings. Severe osteopenia. IMPRESSION: 1. There has been interval  placement of bilateral percutaneous nephrostomy catheters, with formed pigtails in the bilateral renal pelves. There is moderate left hydronephrosis and hydroureter similar to prior examination, with decompression of the right collecting system. 2. Redemonstrated, large, partially necrotic mass of the bladder. There has been interval removal of a previously seen Foley catheter. There is air and fluid within the bladder lumen. Electronically Signed   By: Eddie Candle M.D.   On: 03/22/2019 19:12    Assessment/Plan: #1 bilateral ureteral obstruction- Good UOP -- Nx tubes to be changed tomorrow.   #2 leukocytosis-tachycardia-no obvious infection source. He had more bladder spasm today and nurse did I&O cath which she said didn't drain a lot, but it provided some relief. Check PVR in AM and consider I&O with a larger cath to drain the necrotic debris if needed. Pt is comfortable now.   #3 bladder cancer- he continues atezolizumab with Dr. [name].  It appears he overall is declining and hospice is being considered. He did not touch his dinner and I reminded him it was there. He asked if it was "last night's dinner". He said he will try and eat. He said one day slips by to the next.    LOS: 1 day   Festus Aloe 03/24/2019, 6:09 PM

## 2019-03-24 NOTE — TOC Progression Note (Signed)
Transition of Care [facility]) - Progression Note    Patient Details  Name: Vincent Poole MRN: [id number] Date of Birth: 04-12-1935  Transition of Care [facility]) CM/SW Contact  Latanya Maudlin, RN Phone Number: 03/24/2019, 3:28 PM  Clinical Narrative:   Met with the patient to discuss Needs and [place] He stated that he lives with his wife and son and daughter in law as well as minor Grandchildren. He has a walker he uses at home and has grab bars and a seat in the shower, he states he does not need any more equipment. Patient recently discharged from [facility] care for short term rehab. Patients spouse is at work per the daughter. Daughter Tilda Burrow does not think the patient has home health but it could be helpful although she is unsure if they would be agreeable. Patient appeared to have difficultly managing his urostomy. He stated that he has transportation with his family and they always make sure that he gets to the places he needs to go He sees Dr. Netty Starring as PCP [facility] And can afford his medications with [employer] [facility] and [employer]   Will speak with spouse when available to assess need for home health and if they are agreeable we can pursue referrals.     Expected Discharge Plan: [place] Barriers to Discharge: Continued Medical Work up  Expected Discharge Plan and Services Expected Discharge Plan: [place]   Discharge Planning Services: CM Consult   Living arrangements for the past 2 months: Single Family Home                                       Social Determinants of Health (SDOH) Interventions    Readmission Risk Interventions Readmission Risk Prevention Plan 03/24/2019 02/26/2019 02/22/2019  Transportation Screening Complete Complete Complete  PCP or Specialist Appt within 3-5 Days - Complete -  HRI or Home Care Consult Complete - Complete  Social Work Consult for [place] Planning/Counseling - Complete -   Medication Review Press photographer) - Complete Complete  Some recent data might be hidden

## 2019-03-24 NOTE — Plan of Care (Signed)
Patient still having some pain control issues, but Oxycodone is helping.  I &O cath done with very thick, brown odorous urine.  Patient did have a little relief

## 2019-03-25 ENCOUNTER — Ambulatory Visit
Admission: RE | Admit: 2019-03-25 | Discharge: 2019-03-25 | Disposition: A | Discharge status: Home / Self Care | Payer: Medicare Other | Source: Ambulatory Visit | Attending: Urology | Admitting: Urology

## 2019-03-25 ENCOUNTER — Encounter: Payer: Self-pay | Admitting: Interventional Radiology

## 2019-03-25 DIAGNOSIS — Z436 Encounter for attention to other artificial openings of urinary tract: Secondary | ICD-10-CM | POA: Insufficient documentation

## 2019-03-25 DIAGNOSIS — N1339 Other hydronephrosis: Secondary | ICD-10-CM

## 2019-03-25 DIAGNOSIS — R6510 Systemic inflammatory response syndrome (SIRS) of non-infectious origin without acute organ dysfunction: Secondary | ICD-10-CM

## 2019-03-25 HISTORY — PX: IR NEPHROSTOMY EXCHANGE RIGHT: IMG6070

## 2019-03-25 HISTORY — PX: IR NEPHROSTOMY EXCHANGE LEFT: IMG6069

## 2019-03-25 LAB — URINE CULTURE: Culture: >=100000 — AB

## 2019-03-25 MED ORDER — LIDOCAINE HCL (PF) 1 % IJ SOLN
INTRAMUSCULAR | Status: AC
  Filled 2019-03-25: qty 30

## 2019-03-25 MED ORDER — IOHEXOL 300 MG/ML  SOLN
30.0000 mL | Freq: Once | INTRAMUSCULAR | Status: AC | PRN
  Administered 2019-03-25: 20 mL

## 2019-03-25 MED ORDER — SODIUM CHLORIDE 0.9% FLUSH: 5.0000 mL | Freq: Three times a day (TID) | INTRAVENOUS | Status: DC

## 2019-03-25 MED ORDER — SULFAMETHOXAZOLE-TRIMETHOPRIM 800-160 MG PO TABS
1.0000 | ORAL_TABLET | Freq: Two times a day (BID) | ORAL | Status: DC
  Administered 2019-03-25 – 2019-03-26 (×2): 1 via ORAL
  Filled 2019-03-25 (×4): qty 1

## 2019-03-25 MED ORDER — SULFAMETHOXAZOLE-TRIMETHOPRIM 800-160 MG PO TABS: 1.0000 | ORAL_TABLET | Freq: Two times a day (BID) | ORAL | 0 refills | Status: DC

## 2019-03-25 NOTE — Consult Note (Signed)
Pharmacy Antibiotic Note  Vincent Poole is a 84 y.o. male admitted on 03/22/2019 with sepsis.  He was originally started on Zosyn but urine cultures have grown out Seratia marcescens and Klebsiella oxytoca and Pharmacy has been consulted for an appropriate oral antibiotic choice and dosing.  Plan: --based on UCx sensitivities both Septra and quinolones are active against both organisms but Septra is a better choice given his age  --start Bactrim DS one tablet twice daily --His SCr is elevated above his baseline but is improving  Height: 5\' 11"  (180.3 cm) Weight: 163 lb 9.3 oz (74.2 kg) IBW/kg (Calculated) : 75.3  Temp (24hrs), Avg:99.5 F (37.5 C), Min:97.5 F (36.4 C), Max:100.8 F (38.2 C)  Recent Labs  Lab 03/21/19 0913 03/22/19 1731 03/23/19 0506 03/24/19 0552  WBC 41.8* 36.8* 32.3* 31.7*  CREATININE 1.86* 2.07* 2.07* 1.90*  LATICACIDVEN  --  1.7  --   --     Estimated Creatinine Clearance: 32 mL/min (A) (by C-G formula based on SCr of 1.9 mg/dL (H)).    No Known Allergies  Antimicrobials this admission: Zosyn 5/15 >> 5/18 Bactrim 5/18 >>   Microbiology results: 5/15 BCx: NGTD 5/15 UCx:  Seratia marcescens and Klebsiella oxytoca   Thank you for allowing pharmacy to be a part of this patient's care.  Dallie Piles, PharmD 03/25/2019 12:17 PM

## 2019-03-25 NOTE — Progress Notes (Signed)
Advanced Care Plan.  Purpose of Encounter: code status and hospice care. Parties in Attendance: The patient, his wife and me. Patient's Decisional Capacity: nor sure. Medical Story: Vincent Poole  is a 83 y.o male with bladder cancer, urinary obstruction, HTN, anemia, UTI etc. He is admitted for complicated UTI. I discussed with his wife about his condition, poor prognosis, code status and hospice care. She said that the patient wants to be resuscitated and intubated if he has cardiopulmonary arrest. I discussed with her about palliative care consult. Goals of Care Determinations: palliative care. Plan:  Code Status: Full Code. Time spent discussing advance care planning: 18 minutes.

## 2019-03-25 NOTE — Progress Notes (Addendum)
[place] NAME: Vincent Poole    MR#:  [id number]  DATE OF BIRTH:  Jun 06, 1935  SUBJECTIVE:  CHIEF COMPLAINT:   Chief Complaint  Patient presents with   kidney cath issue   Patient has no complaint. Per RN, he had fever after procedure today. REVIEW OF SYSTEMS:  Review of Systems  Constitutional: Negative for chills, fever and malaise/fatigue.  HENT: Negative for sore throat.   Eyes: Negative for blurred vision and double vision.  Respiratory: Negative for cough, hemoptysis, shortness of breath, wheezing and stridor.   Cardiovascular: Negative for chest pain, palpitations, orthopnea and leg swelling.  Gastrointestinal: Negative for abdominal pain, blood in stool, diarrhea, melena, nausea and vomiting.  Genitourinary: Negative for dysuria, flank pain and hematuria.  Musculoskeletal: Negative for back pain and joint pain.  Skin: Negative for rash.  Neurological: Negative for dizziness, sensory change, focal weakness, seizures, loss of consciousness, weakness and headaches.  Endo/Heme/Allergies: Negative for polydipsia.  Psychiatric/Behavioral: Negative for depression. The patient is not nervous/anxious.     DRUG ALLERGIES:  No Known Allergies VITALS:  Blood pressure 109/63, pulse (!) 103, temperature 99.3 F (37.4 C), temperature source Axillary, resp. rate 18, height 5\' 11"  (1.803 m), weight 74.2 kg, SpO2 100 %. PHYSICAL EXAMINATION:  Physical Exam Constitutional:      Appearance: Normal appearance.  HENT:     Head: Normocephalic.     Mouth/Throat:     Mouth: Mucous membranes are moist.  Eyes:     General: No scleral icterus.    Conjunctiva/sclera: Conjunctivae normal.     Pupils: Pupils are equal, round, and reactive to light.  Neck:     Musculoskeletal: Normal range of motion and neck supple.     Vascular: No JVD.     Trachea: No tracheal deviation.  Cardiovascular:     Rate and Rhythm: Normal rate and  regular rhythm.     Heart sounds: Normal heart sounds. No murmur. No gallop.   Pulmonary:     Effort: Pulmonary effort is normal. No respiratory distress.     Breath sounds: Normal breath sounds. No wheezing or rales.  Abdominal:     General: Bowel sounds are normal. There is no distension.     Palpations: Abdomen is soft.     Tenderness: There is no abdominal tenderness. There is no right CVA tenderness or rebound.  Musculoskeletal: Normal range of motion.        General: No tenderness.     Right lower leg: No edema.     Left lower leg: No edema.  Skin:    Findings: No erythema or rash.  Neurological:     General: No focal deficit present.     Mental Status: He is alert and oriented to person, place, and time.     Cranial Nerves: No cranial nerve deficit.  Psychiatric:        Mood and Affect: Mood normal.    LABORATORY PANEL:  Male CBC Recent Labs  Lab 03/24/19 0552  WBC 31.7*  HGB 8.0*  HCT 25.0*  PLT 313   ------------------------------------------------------------------------------------------------------------------ Chemistries  Recent Labs  Lab 03/22/19 1731  03/24/19 0552  NA 130*   < > 135  K 4.1   < > 3.9  CL 99   < > 104  CO2 21*   < > 23  GLUCOSE 114*   < > 93  BUN 32*   < > 27*  CREATININE 2.07*   < >  1.90*  CALCIUM 8.5*   < > 8.4*  AST 23  --   --   ALT 22  --   --   ALKPHOS 92  --   --   BILITOT 0.6  --   --    < > = values in this interval not displayed.   RADIOLOGY:  Ir Nephrostomy Exchange Left  Result Date: 03/25/2019 INDICATION: 84 year old male with a history of nonfunctional percutaneous nephrostomy EXAM: IR EXCHANGE NEPHROSTOMY LEFT; IR EXCHANGE NEPHROSTOMY RIGHT COMPARISON:  None. MEDICATIONS: None ANESTHESIA/SEDATION: None CONTRAST:  70mL OMNIPAQUE IOHEXOL 300 MG/ML SOLN - administered into the collecting system(s) FLUOROSCOPY TIME:  Fluoroscopy Time: 1 minutes 6 seconds (16.8 mGy). COMPLICATIONS: None PROCEDURE: Informed written  consent was obtained from the patient after a thorough discussion of the procedural risks, benefits and alternatives. All questions were addressed. Maximal Sterile Barrier Technique was utilized including caps, mask, sterile gowns, sterile gloves, sterile drape, hand hygiene and skin antiseptic. A timeout was performed prior to the initiation of the procedure. Patient positioned in the supine position on the fluoroscopy table. The patient was prepped and draped in the usual sterile fashion. Left: 1% lidocaine was used for local anesthesia. Small amount of contrast was infused confirming location in the collecting system. Modified Seldinger technique was then used to exchange for a new 10 [place] percutaneous nephrostomy. Catheter was formed in the collecting system and contrast confirmed location. Catheter was sutured in location and patch to gravity drainage. Right: 1% lidocaine was used for local anesthesia. Small amount of contrast was infused confirming location in the collecting system. Modified Seldinger technique was then used to exchange for a new 10 [place] percutaneous nephrostomy. Catheter was formed in the collecting system and contrast confirmed location. Catheter was sutured in location and patch to gravity drainage. Patient tolerated the procedure well and remained hemodynamically stable throughout. No complications were encountered and no significant blood loss. IMPRESSION: Status post exchange of bilateral percutaneous nephrostomy. Signed, Dulcy Fanny. Dellia Nims, RPVI [facility] Radiology Electronically Signed   By: Corrie Mckusick D.O.   On: 03/25/2019 14:51   Ir Nephrostomy Exchange Right  Result Date: 03/25/2019 INDICATION: 84 year old male with a history of nonfunctional percutaneous nephrostomy EXAM: IR EXCHANGE NEPHROSTOMY LEFT; IR EXCHANGE NEPHROSTOMY RIGHT COMPARISON:  None. MEDICATIONS: None ANESTHESIA/SEDATION: None CONTRAST:  59mL OMNIPAQUE  IOHEXOL 300 MG/ML SOLN - administered into the collecting system(s) FLUOROSCOPY TIME:  Fluoroscopy Time: 1 minutes 6 seconds (16.8 mGy). COMPLICATIONS: None PROCEDURE: Informed written consent was obtained from the patient after a thorough discussion of the procedural risks, benefits and alternatives. All questions were addressed. Maximal Sterile Barrier Technique was utilized including caps, mask, sterile gowns, sterile gloves, sterile drape, hand hygiene and skin antiseptic. A timeout was performed prior to the initiation of the procedure. Patient positioned in the supine position on the fluoroscopy table. The patient was prepped and draped in the usual sterile fashion. Left: 1% lidocaine was used for local anesthesia. Small amount of contrast was infused confirming location in the collecting system. Modified Seldinger technique was then used to exchange for a new 10 [place] percutaneous nephrostomy. Catheter was formed in the collecting system and contrast confirmed location. Catheter was sutured in location and patch to gravity drainage. Right: 1% lidocaine was used for local anesthesia. Small amount of contrast was infused confirming location in the collecting system. Modified Seldinger technique was then used to exchange for a new 10 [place] percutaneous nephrostomy. Catheter was formed in the  collecting system and contrast confirmed location. Catheter was sutured in location and patch to gravity drainage. Patient tolerated the procedure well and remained hemodynamically stable throughout. No complications were encountered and no significant blood loss. IMPRESSION: Status post exchange of bilateral percutaneous nephrostomy. Signed, Dulcy Fanny. Dellia Nims, RPVI [facility] Radiology Electronically Signed   By: Corrie Mckusick D.O.   On: 03/25/2019 14:51   ASSESSMENT AND PLAN:   Complicated UTI. He is treated with Zosyn.  Follow-up CBC and urine culture: >=100,000  COLONIES/mL SERRATIA MARCESCENS; >=100,000 COLONIES/mL KLEBSIELLA OXYTOCA . Change to bactrim DS.    Urinary obstruction,   Nephrostomy tubes are changed by IR this am. Per RN, the tubes are clotted and nasty.  Urinary retention.  Urine and out cath done per Dr. Patsy [place].  CKD stage IV.  Stable.   Essential hypertension -hold home dose antihypertensives since blood pressure is soft.   Malignant neoplasm of urinary bladder ([place]) -continue home meds, follow-up urologist as outpatient. Acute on chronic  Leukocytosis, IV antibiotics initially as above, follow-up CBC. History of a flutter.  Continue Eliquis. Anemia of chronic disease.  Hemoglobin decreased to 7.8.  No active bleeding.  Follow-up hemoglobin 8.0.  Poor prognosis, palliative care consult. All the records are reviewed and case discussed with Care Management/Social Worker. Management plans discussed with the patient, his wife and they are in agreement.  CODE STATUS: Full Code  TOTAL TIME TAKING CARE OF THIS PATIENT: 26 minutes.   More than 50% of the time was spent in counseling/coordination of care: YES  POSSIBLE D/C IN 1-2 DAYS, DEPENDING ON CLINICAL CONDITION.   Demetrios Loll M.D on 03/25/2019 at 4:00 PM  Between 7am to 6pm - Pager - [phone]  After 6pm go to [email or web address] - [employer] [facility]

## 2019-03-25 NOTE — Progress Notes (Signed)
Patient post bilateral nephrostomy tube change per Dr Earleen Newport, tolerated well. Patient prior to procedure, tearful, however after spending time and interacting with patient, no longer tearful. Vitals stable post procedure. Denies complaints at this time. Bil. Tubes changed,draining purulent material from both tubes when changed. Report given to [facility] RN with questions answered.no sedation given to patient with procedure.

## 2019-03-25 NOTE — TOC Transition Note (Addendum)
Transition of Care [facility]) - CM/SW Discharge Note   Patient Details  Name: Vincent Poole MRN: [id number] Date of Birth: [birth year]  Transition of Care [facility]) CM/SW Contact:  Shade Flood, LCSW Phone Number: 03/25/2019, 9:58 AM   Clinical Narrative:     LCSW attempted to meet with pt this AM to discuss HH at dc. Pt sleeping and LCSW did not awaken. Spoke with pt's wife by phone to offer [facility] RN at [employer]. Pt's wife states that her gdtr is a CNA and she has been helping manage the tubes and she does not feel that they need a [facility] RN at this time. Pt's wife states that pt has all the DME he needs. She denies any TOC needs at this time.  It appears Palliative Care consult and PT have been ordered. Will await their evaluations and further assist if needed.  Final next level of care: Home/Self Care Barriers to Discharge: Barriers Resolved   Patient Goals and CMS Choice   CMS Medicare.gov Compare Post Acute Care list provided to:: Patient    Discharge Placement                       Discharge Plan and Services   Discharge Planning Services: CM Consult                                 Social Determinants of Health (SDOH) Interventions     Readmission Risk Interventions Readmission Risk Prevention Plan 03/24/2019 02/26/2019 02/22/2019  Transportation Screening Complete Complete Complete  PCP or Specialist Appt within 3-5 Days - Complete -  HRI or Home Care Consult Complete - Complete  Social Work Consult for [place] Planning/Counseling - Complete -  Medication Review Press photographer) - Complete Complete  Some recent data might be hidden

## 2019-03-25 NOTE — Discharge Instructions (Signed)
-   Fall precaution 

## 2019-03-25 NOTE — Progress Notes (Signed)
PT Cancellation Note  Patient Details Name: Vincent Poole MRN: [id number] DOB: 1934-11-26   Cancelled Treatment:    Reason Eval/Treat Not Completed: Pain limiting ability to participate(Consult received and chart reviewed. Patient refusing participation with session due to pain in flank (meds recently received).  Will re-attempt at later time/date as medically appropriate and patient agreeable.)  Shawntay Prest H. Owens Shark, PT, DPT, NCS 03/25/19, 12:24 PM [phone]  Of note, patient reports living with wife in single-story home; 4 steps with single rail to enter.  Ambulatory for limited household distances with RW and +1 assist; denies recent fall history. Scheduled for nephrostomy tube change out this PM; will re-attempt session at later time/date as pain controlled and patient available.

## 2019-03-25 NOTE — Procedures (Signed)
Interventional Radiology Procedure Note  Procedure: Routine Exchange of bilateral nephrostomy tubes.  New 74F drains placed to gravity.  Complications: None Recommendations:  - To gravity - Do not submerge - Routine drain care - Routine exhcanges  Signed,  Dulcy Fanny. Earleen Newport, DO

## 2019-03-25 NOTE — Progress Notes (Signed)
Urology Consult Follow Up  Subjective: Mr. Venable is resting comfortably.  Nephrostomy tubes in place draining clear yellow urine from the left tube and cloudy yellow urine from the right tube.  Schedule for nephrostomy tube exchange today.   Anti-infectives: Anti-infectives (From admission, onward)   Start     Dose/Rate Route Frequency Ordered Stop   03/23/19 0200  piperacillin-tazobactam (ZOSYN) IVPB 3.375 g     3.375 g 12.5 mL/hr over 240 Minutes Intravenous Every 8 hours 03/22/19 2154     03/22/19 2000  piperacillin-tazobactam (ZOSYN) IVPB 3.375 g     3.375 g 100 mL/hr over 30 Minutes Intravenous  Once 03/22/19 1950 03/22/19 2104      Current Facility-Administered Medications  Medication Dose Route Frequency Provider Last Rate Last Dose  . acetaminophen (TYLENOL) tablet 650 mg  650 mg Oral Q6H PRN Lance Coon, MD       Or  . acetaminophen (TYLENOL) suppository 650 mg  650 mg Rectal Q6H PRN Lance Coon, MD      . apixaban Arne Cleveland) tablet 2.5 mg  2.5 mg Oral BID Demetrios Loll, MD   2.5 mg at 03/24/19 2123  . bisacodyl (DULCOLAX) EC tablet 5 mg  5 mg Oral Daily PRN Demetrios Loll, MD      . ondansetron [facility]) tablet 4 mg  4 mg Oral Q6H PRN Lance Coon, MD       Or  . ondansetron [facility]) injection 4 mg  4 mg Intravenous Q6H PRN Lance Coon, MD      . oxyCODONE (Oxy IR/ROXICODONE) immediate release tablet 5 mg  5 mg Oral Q4H PRN Lance Coon, MD   5 mg at 03/24/19 2130  . piperacillin-tazobactam (ZOSYN) IVPB 3.375 g  3.375 g Intravenous Q8H Hallaji, Sheema M, RPH 12.5 mL/hr at 03/25/19 0636 3.375 g at 03/25/19 0636  . polyethylene glycol (MIRALAX / GLYCOLAX) packet 17 g  17 g Oral Daily PRN Demetrios Loll, MD         Objective: Vital signs in last 24 hours: Temp:  [97.5 F (36.4 C)-100.8 F (38.2 C)] 97.5 F (36.4 C) (05/18 0539) Pulse Rate:  [87-110] 95 (05/18 0539) Resp:  [20] 20 (05/18 0539) BP: (95-115)/(45-58) 115/58 (05/18 0539) SpO2:  [97 %-98 %] 98 % (05/18  0539)  Intake/Output from previous day: 05/17 0701 - 05/18 0700 In: 120 [P.O.:20; IV Piggyback:100] Out: 1400 [Urine:1400] Intake/Output this shift: No intake/output data recorded.   Physical Exam Constitutional:  Elderly and cachectic appearing.   HEENT: [place] AT, moist mucus membranes.  Trachea midline, no masses. Cardiovascular: No clubbing, cyanosis, or edema. Respiratory: Normal respiratory effort, no increased work of breathing. GI: Abdomen is soft, non tender, non distended, no abdominal masses.  GU: No CVA tenderness.  Good UOP in each nephrostomy bag.  No bladder fullness or masses.  Patient with mild penile edema.  Mucous and scantly bloody discharge noted on pad.  No penile lesions or rashes. Scrotum without lesions, cysts, rashes and/or edema.   Skin: Dressing on sacrum and nephrostomy tubes are clean and dry.   Neurologic: Grossly intact, no focal deficits, moving all 4 extremities. Psychiatric: Normal mood and affect.  Lab Results:  Recent Labs    03/23/19 0506 03/24/19 0552  WBC 32.3* 31.7*  HGB 7.8* 8.0*  HCT 24.5* 25.0*  PLT 285 313   BMET Recent Labs    03/23/19 0506 03/24/19 0552  NA 135 135  K 3.7 3.9  CL 104 104  CO2 22 23  GLUCOSE  103* 93  BUN 30* 27*  CREATININE 2.07* 1.90*  CALCIUM 8.3* 8.4*   PT/INR Recent Labs    03/22/19 1731  LABPROT 21.5*  INR 1.9*   ABG No results for input(s): PHART, HCO3 in the last 72 hours.  Invalid input(s): PCO2, PO2  Studies/Results: No results found.   Assessment and Plan 1. Bilateral ureteral obstruction - good UOP - nephrostomy tubes to be exchanged tomorrow  2. Leukocytosis - urine cultures positive for serratia marcescens and klebsiella oxytoca but still unclear if this is the etiology of leukocytosis as his cancer may be playing a role - currently receiving antibiotics - Bactrim to which the organisms are sensitive  - continue to trend CBC   3. Bladder cancer - atezolizumab with Dr. [name]  -  palliative care is consulted        LOS: 2 days    [facility] 03/25/2019

## 2019-03-26 NOTE — Evaluation (Signed)
Physical Therapy Evaluation Patient Details Name: Vincent Poole MRN: [id number] DOB: [birth year] Today's Date: 03/26/2019   History of Present Illness  84 y.o. male with a known history of bladder cancer with bilateral indwelling stents and chronic indwelling foley who was directly admitted to hospital on 02/21/2019 from the urology office for worsening leucocytosis in the setting of worsening b/l hydronephrosis, ureteral stents.  He as been recently treated for enterococcus fecalis in the urine by his oncologist first with Augmentin and then levaquin. Relevant PMH includes DVT diagnosed 01/04/2019,  GERD, anxiety, arthrits, history of atrial flutter, R femur fracture, HTN, prostate cancer, umbilical hernia, wound eschar L of foot, urinary retention with urinary catheter, multiple bladder surgeries/procedures. Recently here and discharged home and retuns with SIRS.  Clinical Impression  Pt anxious (and nearly tearful at times) t/o the session, but was able to participate with mobility tasks and despite fear and hesitancy was able to do some limited ambulation in the room.  He did fatigue with limited ambulation and was highly reliant on the walker with increased pain with most acts.  Pt very anxious about the idea of going to a rehab facility and is very much hoping to go home.  He did not have any overt safety/LOBs with limited activity during PT exam but clearly will be limited with mobility initially at home.  He states he has 24/7 assist at home and that multiple family members are around and able to assist.     Follow Up Recommendations Supervision/Assistance - 24 hour;Home health PT(per progress)    Equipment Recommendations  None recommended by PT    Recommendations for Other Services       Precautions / Restrictions Precautions Precautions: Fall Restrictions Weight Bearing Restrictions: No      Mobility  Bed Mobility Overal bed mobility: Needs Assistance Bed Mobility: Supine to  Sit;Sit to Supine     Supine to sit: Mod assist     General bed mobility comments: Pt showed good effort getting up to EOB, needed considerable HHA to get to sitting  Transfers Overall transfer level: Needs assistance Equipment used: Rolling walker (2 wheeled) Transfers: Sit to/from Stand Sit to Stand: Mod assist         General transfer comment: Pt was unable to rise to standing with bed at standard height, mod assist from 4" raised bed  Ambulation/Gait Ambulation/Gait assistance: Min assist;Mod assist Gait Distance (Feet): 7 Feet Assistive device: Rolling walker (2 wheeled)       General Gait Details: Pt forward flexed and very short/shuffling steps.  Pt was anxious, but able to do limited ambulation and quick to fatigue.  Stairs            Wheelchair Mobility    Modified Rankin (Stroke Patients Only)       Balance Overall balance assessment: Needs assistance Sitting-balance support: Bilateral upper extremity supported Sitting balance-Leahy Scale: Fair Sitting balance - Comments: Pt was able to maintain sitting, but anxious and needing encouragement     Standing balance-Leahy Scale: Poor Standing balance comment: stooped, highly reliant on walker, fearful                             Pertinent Vitals/Pain Pain Assessment: 0-10 Pain Score: 5  Pain Location: kidney drain insertion sites    Home Living Family/patient expects to be discharged to:: Private residence Living Arrangements: Spouse/significant other;Children Available Help at Discharge: Family;Available 24 hours/day Type of Home:  Mobile home Home Access: Stairs to enter Entrance Stairs-Rails: Left Entrance Stairs-Number of Steps: 4-5 Home Layout: One level        Prior Function Level of Independence: Needs assistance   Gait / Transfers Assistance Needed: transfers with assist from family to RW. Ambulates with assistance with RW.   ADL's / Homemaking Assistance Needed:  patient requires help with dressing, bathing at times. Requires help with IADLs.         Hand Dominance        Extremity/Trunk Assessment   Upper Extremity Assessment Upper Extremity Assessment: Generalized weakness(shld elevation to ~90, grossly 3+/5)    Lower Extremity Assessment Lower Extremity Assessment: Generalized weakness(grossly 3+ to 4-/5)       Communication   Communication: HOH  Cognition Arousal/Alertness: Awake/alert Behavior During Therapy: WFL for tasks assessed/performed;Anxious Overall Cognitive Status: Within Functional Limits for tasks assessed                                        General Comments      Exercises     Assessment/Plan    PT Assessment Patient needs continued PT services  PT Problem List Decreased activity tolerance;Decreased strength;Decreased balance;Decreased mobility;Decreased cognition;Decreased knowledge of use of DME;Decreased safety awareness       PT Treatment Interventions DME instruction;Gait training;Stair training;Functional mobility training;Therapeutic activities;Balance training;Therapeutic exercise;Neuromuscular re-education;Patient/family education    PT Goals (Current goals can be found in the Care Plan section)  Acute Rehab PT Goals Patient Stated Goal: pt does not want to go to rehab if possible PT Goal Formulation: With patient Time For Goal Achievement: 04/09/19 Potential to Achieve Goals: Fair    Frequency Min 2X/week   Barriers to discharge        Co-evaluation               AM-PAC PT "6 Clicks" Mobility  Outcome Measure Help needed turning from your back to your side while in a flat bed without using bedrails?: A Little Help needed moving from lying on your back to sitting on the side of a flat bed without using bedrails?: A Lot Help needed moving to and from a bed to a chair (including a wheelchair)?: A Lot Help needed standing up from a chair using your arms (e.g.,  wheelchair or bedside chair)?: A Lot Help needed to walk in hospital room?: A Lot Help needed climbing 3-5 steps with a railing? : Total 6 Click Score: 12    End of Session Equipment Utilized During Treatment: Gait belt Activity Tolerance: (anxious) Patient left: with chair alarm set;with call bell/phone within reach Nurse Communication: Mobility status PT Visit Diagnosis: Muscle weakness (generalized) (M62.81);Difficulty in walking, not elsewhere classified (R26.2)    Time: 5573-2202 PT Time Calculation (min) (ACUTE ONLY): 35 min   Charges:   PT Evaluation $PT Eval Low Complexity: 1 Low PT Treatments $Therapeutic Exercise: 8-22 mins        Kreg Shropshire, DPT 03/26/2019, 1:32 PM

## 2019-03-26 NOTE — Progress Notes (Signed)
Discharge instructions reviewed with patient including followup visits and new medications.  Understanding was verbalized and all questions were answered.  IV removed without complication; patient tolerated well.  Patient discharged home via wheelchair in stable condition escorted by nursing staff.  

## 2019-03-26 NOTE — Care Management Important Message (Signed)
Important Message  Patient Details  Name: Vincent Poole MRN: [id number] Date of Birth: Sep 28, 1935   Medicare Important Message Given:  Yes    Dannette Barbara 03/26/2019, 10:43 AM

## 2019-03-26 NOTE — Progress Notes (Signed)
PMT consult received and chart reviewed. Discussed with multidisciplinary team including Dr. Bridgett Larsson, RN, SW, and RN CM. Patient is stable for discharge today. Family refusing home health services. Last seen by oncology 03/21/19 with plans to return to clinic in 3 weeks. Discussed with Billey Chang, outpatient palliative provider at [facility]. Josh plans to discuss with Dr. [name] and likely follow-up with patient/family at next clinic visit.  NO CHARGE  Ihor Dow, [place], FNP-C Palliative Medicine Team  Phone: [phone] Fax: [phone]
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
## 2019-03-26 NOTE — Discharge Summary (Signed)
[facility] NAME: Vincent Poole    MR#:  [id number]  [place] OF BIRTH:  01-Jun-1935  DATE OF ADMISSION:  03/22/2019   ADMITTING PHYSICIAN: Lance Coon, MD  DATE OF DISCHARGE: 03/26/2019 PRIMARY CARE PHYSICIAN: [name], MD   ADMISSION DIAGNOSIS:  Urinary tract infection without hematuria, site unspecified [N39.0] DISCHARGE DIAGNOSIS:  Principal Problem:   SIRS (systemic inflammatory response syndrome) (HCC) Active Problems:   Essential hypertension   Urinary obstruction   Malignant neoplasm of urinary bladder (HCC)   Complicated UTI (urinary tract infection)   Leukocytosis   Pressure injury of skin  SECONDARY DIAGNOSIS:   Past Medical History:  Diagnosis Date   Anxiety    Arthritis    Bladder cancer ([employer])    Dysrhythmia 07/2018   history of atrial flutter that worsens with anxiety   Femur fracture, right ([place]) 05/01/2018   GERD (gastroesophageal reflux disease)    History of recent blood transfusion 05/2018   Hypertension    Iron deficiency anemia 09/14/2018   Prostate cancer ([place]) 07/2018   cancer growing in prostate but not prostate cancer, it is from the bladder   Umbilical hernia 89/2119   Urinary retention 2019   foley catheter place 11/2017   UTI (urinary tract infection) 2019   frequent UTI's over last year   Wound eschar of foot 07/2018   left heal getting wrapped and requiring antibiotic cream. cracks open with weight bearing.   HOSPITAL COURSE:  Complicated UTI. He is treated with Zosyn.  Follow-up CBC and urine culture: >=100,000 COLONIES/mL SERRATIA MARCESCENS; >=100,000 COLONIES/mL KLEBSIELLA OXYTOCA . Changed to bactrim DS for 7 more days.. The patient has some discharge from penis.  I discussed with Dr. Junious Silk, who suggested the patient can follow-up as outpatient.  Urinary obstruction,   Nephrostomy tubes are changed by IR. Per RN, the tubes are clotted and  nasty.  Urinary retention.  Urine and out cath done per Dr. Patsy [place].  CKD stage IV.  Stable. Essential hypertension -hold home dose antihypertensives since blood pressure is soft. Malignant neoplasm of urinary bladder ([place]) -continue home meds, follow-up urologist as outpatient. Acute on chronicLeukocytosis, IV antibiotics initially as above, follow-up CBC. History of a flutter.  Continue Eliquis. Anemia of chronic disease.  Hemoglobin decreased to 7.8.  No active bleeding.  Follow-up hemoglobin 8.0.  Poor prognosis, follow-up palliative care nurse practitioner Josh as outpatient. The patient's wife refused home health and PT. DISCHARGE CONDITIONS:  Stable, discharged to home today. CONSULTS OBTAINED:  Treatment Team:  [name], MD DRUG ALLERGIES:  No Known Allergies DISCHARGE MEDICATIONS:   Allergies as of 03/26/2019   No Known Allergies     Medication List    TAKE these medications   acetaminophen 500 MG tablet Commonly known as:  TYLENOL Take 1,000 mg by mouth every 6 (six) hours as needed for moderate pain or headache.   cetirizine 10 MG tablet Commonly known as:  ZYRTEC Take 10 mg by mouth daily as needed for allergies.   Eliquis 5 MG Tabs tablet Generic drug:  apixaban TAKE ONE TABLET BY MOUTH TWICE DAILY What changed:  how much to take   ferrous sulfate 325 (65 FE) MG tablet Take 1 tablet (325 mg total) by mouth 2 (two) times daily with a meal.   lidocaine-prilocaine cream Commonly known as:  EMLA Apply 1 application topically as needed.   multivitamin with minerals Tabs tablet Take 1 tablet by mouth daily.  Myrbetriq 50 MG Tb24 tablet Generic drug:  mirabegron ER TAKE ONE TABLET EVERY DAY What changed:  how much to take   nystatin powder Commonly known as:  Nyamyc Apply topically 2 (two) times daily.   oxyCODONE 5 MG immediate release tablet Commonly known as:  Oxy IR/ROXICODONE Take 1 tablet (5 mg total) by mouth every 4 (four)  hours as needed for severe pain.   polyethylene glycol powder 17 GM/SCOOP powder Commonly known as:  MiraLax Take 17 g by mouth daily as needed. Can increase to 3 times a day as needed for constipation but hold medication if has diarrhea   potassium chloride SA 20 MEQ tablet Commonly known as:  K-DUR Take 20 mEq by mouth 2 (two) times daily.   sulfamethoxazole-trimethoprim 800-160 MG tablet Commonly known as:  BACTRIM DS Take 1 tablet by mouth every 12 (twelve) hours.   Super Cranberry/Vitamin D3 4200-500 MG-UNIT Caps Generic drug:  Cranberry-Cholecalciferol Take 1 capsule by mouth 2 (two) times daily.   vitamin B-12 500 MCG tablet Commonly known as:  CYANOCOBALAMIN Take 500 mcg by mouth daily.   zinc oxide 20 % ointment Apply 1 application topically as needed for irritation.        DISCHARGE INSTRUCTIONS:  See AVS.  If you experience worsening of your admission symptoms, develop shortness of breath, life threatening emergency, suicidal or homicidal thoughts you must seek medical attention immediately by calling 911 or calling your MD immediately  if symptoms less severe.  You Must read complete instructions/literature along with all the possible adverse reactions/side effects for all the Medicines you take and that have been prescribed to you. Take any new Medicines after you have completely understood and accpet all the possible adverse reactions/side effects.   Please note  You were cared for by a hospitalist during your hospital stay. If you have any questions about your discharge medications or the care you received while you were in the hospital after you are discharged, you can call the unit and asked to speak with the hospitalist on call if the hospitalist that took care of you is not available. Once you are discharged, your primary care physician will handle any further medical issues. Please note that NO REFILLS for any discharge medications will be authorized once you  are discharged, as it is imperative that you return to your primary care physician (or establish a relationship with a primary care physician if you do not have one) for your aftercare needs so that they can reassess your need for medications and monitor your lab values.    On the day of Discharge:  VITAL SIGNS:  Blood pressure (!) 114/58, pulse 93, temperature 97.6 F (36.4 C), temperature source Oral, resp. rate 20, height 5\' 11"  (1.803 m), weight 74.2 kg, SpO2 99 %. PHYSICAL EXAMINATION:  GENERAL:  84 y.o.-year-old patient lying in the bed with no acute distress.  EYES: Pupils equal, round, reactive to light and accommodation. No scleral icterus. Extraocular muscles intact.  HEENT: Head atraumatic, normocephalic. Oropharynx and nasopharynx clear.  NECK:  Supple, no jugular venous distention. No thyroid enlargement, no tenderness.  LUNGS: Normal breath sounds bilaterally, no wheezing, rales,rhonchi or crepitation. No use of accessory muscles of respiration.  CARDIOVASCULAR: S1, S2 normal. No murmurs, rubs, or gallops.  ABDOMEN: Soft, non-tender, non-distended. Bowel sounds present. No organomegaly or mass.  Nephrostomy tube in situ with clear urine.  Some discharge from penis. EXTREMITIES: No pedal edema, cyanosis, or clubbing.  NEUROLOGIC: Cranial nerves II through  XII are intact. Muscle strength 4/5 in all extremities. Sensation intact. Gait not checked.  PSYCHIATRIC: The patient is alert and oriented x 3.  SKIN: No obvious rash, lesion, or ulcer.  DATA REVIEW:   CBC Recent Labs  Lab 03/24/19 0552  WBC 31.7*  HGB 8.0*  HCT 25.0*  PLT 313    Chemistries  Recent Labs  Lab 03/22/19 1731  03/24/19 0552  NA 130*   < > 135  K 4.1   < > 3.9  CL 99   < > 104  CO2 21*   < > 23  GLUCOSE 114*   < > 93  BUN 32*   < > 27*  CREATININE 2.07*   < > 1.90*  CALCIUM 8.5*   < > 8.4*  AST 23  --   --   ALT 22  --   --   ALKPHOS 92  --   --   BILITOT 0.6  --   --    < > = values in  this interval not displayed.     Microbiology Results  Results for orders placed or performed during the hospital encounter of 03/22/19  Culture, blood (routine x 2)     Status: None (Preliminary result)   Collection Time: 03/22/19  5:32 PM  Result Value Ref Range Status   Specimen Description BLOOD LFA  Final   Special Requests   Final    BOTTLES DRAWN AEROBIC AND ANAEROBIC Blood Culture results may not be optimal due to an excessive volume of blood received in culture bottles   Culture   Final    NO GROWTH 4 DAYS Performed at [facility], [address], [place], [place] [address]    Report Status PENDING  Incomplete  Urine culture     Status: Abnormal   Collection Time: 03/22/19  5:55 PM  Result Value Ref Range Status   Specimen Description   Final    URINE, RANDOM Performed at [facility], [address], [place], [place] [address]    Special Requests   Final    NONE Performed at [facility], [address], [place], [place] [address]    Culture (A)  Final    >=100,000 COLONIES/mL MULTIPLE SPECIES PRESENT, SUGGEST RECOLLECTION   Report Status 03/24/2019 FINAL  Final  Culture, blood (routine x 2)     Status: None (Preliminary result)   Collection Time: 03/22/19  7:41 PM  Result Value Ref Range Status   Specimen Description BLOOD RIGHT FOREARM  Final   Special Requests   Final    BOTTLES DRAWN AEROBIC AND ANAEROBIC Blood Culture adequate volume   Culture   Final    NO GROWTH 4 DAYS Performed at [facility], [address] [place], [place] [address]    Report Status PENDING  Incomplete  SARS Coronavirus 2 (CEPHEID - Performed in [facility] hospital lab), Hosp Order     Status: None   Collection Time: 03/22/19  8:45 PM  Result Value Ref Range Status   SARS Coronavirus 2 NEGATIVE NEGATIVE Final    Comment: (NOTE) If result is NEGATIVE SARS-CoV-2 target nucleic acids are NOT DETECTED. The SARS-CoV-2 RNA is generally  detectable in upper and lower  respiratory specimens during the acute phase of infection. The lowest  concentration of SARS-CoV-2 viral copies this assay can detect is 250  copies / mL. A negative result does not preclude SARS-CoV-2 infection  and should not be used as the  sole basis for treatment or other  patient management decisions.  A negative result may occur with  improper specimen collection / handling, submission of specimen other  than nasopharyngeal swab, presence of viral mutation(s) within the  areas targeted by this assay, and inadequate number of viral copies  (<250 copies / mL). A negative result must be combined with clinical  observations, patient history, and epidemiological information. If result is POSITIVE SARS-CoV-2 target nucleic acids are DETECTED. The SARS-CoV-2 RNA is generally detectable in upper and lower  respiratory specimens dur ing the acute phase of infection.  Positive  results are indicative of active infection with SARS-CoV-2.  Clinical  correlation with patient history and other diagnostic information is  necessary to determine patient infection status.  Positive results do  not rule out bacterial infection or co-infection with other viruses. If result is PRESUMPTIVE POSTIVE SARS-CoV-2 nucleic acids MAY BE PRESENT.   A presumptive positive result was obtained on the submitted specimen  and confirmed on repeat testing.  While 2019 novel coronavirus  (SARS-CoV-2) nucleic acids may be present in the submitted sample  additional confirmatory testing may be necessary for epidemiological  and / or clinical management purposes  to differentiate between  SARS-CoV-2 and other Sarbecovirus currently known to infect humans.  If clinically indicated additional testing with an alternate test  methodology [phone]) is advised. The SARS-CoV-2 RNA is generally  detectable in upper and lower respiratory sp ecimens during the acute  phase of infection. The  expected result is Negative. Fact Sheet for Patients:  StrictlyIdeas.no Fact Sheet for Healthcare Providers: BankingDealers.co.za This test is not yet approved or cleared by the [place] FDA and has been authorized for detection and/or diagnosis of SARS-CoV-2 by FDA under an Emergency Use Authorization (EUA).  This EUA will remain in effect (meaning this test can be used) for the duration of the COVID-19 declaration under Section 564(b)(1) of the Act, 21 U.S.C. section 360bbb-3(b)(1), unless the authorization is terminated or revoked sooner. Performed at [facility], [address] [place], [place] [address]   Urine culture     Status: Abnormal   Collection Time: 03/22/19  8:45 PM  Result Value Ref Range Status   Specimen Description   Final    URINE, RANDOM LEFT NEPH Performed at [facility], [address] [place], [place] [address]    Special Requests   Final    NONE Performed at [facility], [address] [place], [place] [address]    Culture (A)  Final    >=100,000 COLONIES/mL SERRATIA MARCESCENS >=100,000 COLONIES/mL KLEBSIELLA OXYTOCA    Report Status 03/25/2019 FINAL  Final   Organism ID, Bacteria SERRATIA MARCESCENS (A)  Final   Organism ID, Bacteria KLEBSIELLA OXYTOCA (A)  Final      Susceptibility   Klebsiella oxytoca - MIC*    AMPICILLIN RESISTANT Resistant     CEFAZOLIN <=4 SENSITIVE Sensitive     CEFTRIAXONE <=1 SENSITIVE Sensitive     CIPROFLOXACIN <=0.25 SENSITIVE Sensitive     GENTAMICIN <=1 SENSITIVE Sensitive     IMIPENEM 0.5 SENSITIVE Sensitive     NITROFURANTOIN 64 INTERMEDIATE Intermediate     TRIMETH/SULFA <=20 SENSITIVE Sensitive     AMPICILLIN/SULBACTAM 8 SENSITIVE Sensitive     PIP/TAZO <=4 SENSITIVE Sensitive     Extended ESBL NEGATIVE Sensitive     * >=100,000 COLONIES/mL KLEBSIELLA OXYTOCA   Serratia marcescens - MIC*    CEFAZOLIN >=64 RESISTANT Resistant  CEFTRIAXONE <=1 SENSITIVE Sensitive     CIPROFLOXACIN <=0.25 SENSITIVE Sensitive     GENTAMICIN <=1 SENSITIVE Sensitive     NITROFURANTOIN 256 RESISTANT Resistant     TRIMETH/SULFA <=20 SENSITIVE Sensitive     * >=100,000 COLONIES/mL SERRATIA MARCESCENS    RADIOLOGY:  Ir Nephrostomy Exchange Left  Result Date: 03/25/2019 INDICATION: 84 year old male with a history of nonfunctional percutaneous nephrostomy EXAM: IR EXCHANGE NEPHROSTOMY LEFT; IR EXCHANGE NEPHROSTOMY RIGHT COMPARISON:  None. MEDICATIONS: None ANESTHESIA/SEDATION: None CONTRAST:  53mL OMNIPAQUE IOHEXOL 300 MG/ML SOLN - administered into the collecting system(s) FLUOROSCOPY TIME:  Fluoroscopy Time: 1 minutes 6 seconds (16.8 mGy). COMPLICATIONS: None PROCEDURE: Informed written consent was obtained from the patient after a thorough discussion of the procedural risks, benefits and alternatives. All questions were addressed. Maximal Sterile Barrier Technique was utilized including caps, mask, sterile gowns, sterile gloves, sterile drape, hand hygiene and skin antiseptic. A timeout was performed prior to the initiation of the procedure. Patient positioned in the supine position on the fluoroscopy table. The patient was prepped and draped in the usual sterile fashion. Left: 1% lidocaine was used for local anesthesia. Small amount of contrast was infused confirming location in the collecting system. Modified [place] technique was then used to exchange for a new 10 [place] percutaneous nephrostomy. Catheter was formed in the collecting system and contrast confirmed location. Catheter was sutured in location and patch to gravity drainage. Right: 1% lidocaine was used for local anesthesia. Small amount of contrast was infused confirming location in the collecting system. Modified [place] technique was then used to exchange for a new 10 [place] percutaneous nephrostomy. Catheter was formed in the collecting system and contrast confirmed location.  Catheter was sutured in location and patch to gravity drainage. Patient tolerated the procedure well and remained hemodynamically stable throughout. No complications were encountered and no significant blood loss. IMPRESSION: Status post exchange of bilateral percutaneous nephrostomy. Signed, Dulcy Fanny. Dellia Nims, RPVI [facility] Radiology Electronically Signed   By: Corrie Mckusick D.O.   On: 03/25/2019 14:51   Ir Nephrostomy Exchange Right  Result Date: 03/25/2019 INDICATION: 84 year old male with a history of nonfunctional percutaneous nephrostomy EXAM: IR EXCHANGE NEPHROSTOMY LEFT; IR EXCHANGE NEPHROSTOMY RIGHT COMPARISON:  None. MEDICATIONS: None ANESTHESIA/SEDATION: None CONTRAST:  35mL OMNIPAQUE IOHEXOL 300 MG/ML SOLN - administered into the collecting system(s) FLUOROSCOPY TIME:  Fluoroscopy Time: 1 minutes 6 seconds (16.8 mGy). COMPLICATIONS: None PROCEDURE: Informed written consent was obtained from the patient after a thorough discussion of the procedural risks, benefits and alternatives. All questions were addressed. Maximal Sterile Barrier Technique was utilized including caps, mask, sterile gowns, sterile gloves, sterile drape, hand hygiene and skin antiseptic. A timeout was performed prior to the initiation of the procedure. Patient positioned in the supine position on the fluoroscopy table. The patient was prepped and draped in the usual sterile fashion. Left: 1% lidocaine was used for local anesthesia. Small amount of contrast was infused confirming location in the collecting system. Modified [place] technique was then used to exchange for a new 10 [place] percutaneous nephrostomy. Catheter was formed in the collecting system and contrast confirmed location. Catheter was sutured in location and patch to gravity drainage. Right: 1% lidocaine was used for local anesthesia. Small amount of contrast was infused confirming location in the collecting  system. Modified [place] technique was then used to exchange for a new 10 [place] percutaneous nephrostomy. Catheter was formed in the collecting system and contrast confirmed location. Catheter was sutured in  location and patch to gravity drainage. Patient tolerated the procedure well and remained hemodynamically stable throughout. No complications were encountered and no significant blood loss. IMPRESSION: Status post exchange of bilateral percutaneous nephrostomy. Signed, Dulcy Fanny. Dellia Nims, RPVI [facility] Electronically Signed   By: Corrie Mckusick D.O.   On: 03/25/2019 14:51     Management plans discussed with the patient, family and they are in agreement.  CODE STATUS: Full Code   TOTAL TIME TAKING CARE OF THIS PATIENT: 33 minutes.    Demetrios Loll M.D on 03/26/2019 at 12:29 PM  Between 7am to 6pm - Pager - [phone]  After 6pm go to [email or web address] - Proofreader  [facility] [employer]  Office  [phone]  CC: Primary care physician; [name], MD   Note: This dictation was prepared with Dragon dictation along with smaller phrase technology. Any transcriptional errors that result from this process are unintentional.

## 2019-03-27 LAB — CULTURE, BLOOD (ROUTINE X 2)
Culture: NO GROWTH
Culture: NO GROWTH
Special Requests: ADEQUATE

## 2019-04-04 ENCOUNTER — Inpatient Hospital Stay (HOSPITAL_BASED_OUTPATIENT_CLINIC_OR_DEPARTMENT_OTHER): Payer: Medicare Other | Admitting: Oncology

## 2019-04-04 ENCOUNTER — Encounter: Payer: Self-pay | Admitting: Oncology

## 2019-04-04 ENCOUNTER — Ambulatory Visit: Admission: RE | Admit: 2019-04-04 | Payer: Medicare Other | Source: Ambulatory Visit | Admitting: Radiation Oncology

## 2019-04-04 ENCOUNTER — Other Ambulatory Visit: Payer: Self-pay | Admitting: *Deleted

## 2019-04-04 ENCOUNTER — Inpatient Hospital Stay: Payer: Medicare Other

## 2019-04-04 ENCOUNTER — Other Ambulatory Visit: Payer: Self-pay

## 2019-04-04 VITALS — BP 123/73 | HR 121 | Temp 95.7°F | Resp 18 | Ht 71.0 in

## 2019-04-04 DIAGNOSIS — Z9221 Personal history of antineoplastic chemotherapy: Secondary | ICD-10-CM

## 2019-04-04 DIAGNOSIS — K219 Gastro-esophageal reflux disease without esophagitis: Secondary | ICD-10-CM

## 2019-04-04 DIAGNOSIS — R5381 Other malaise: Secondary | ICD-10-CM

## 2019-04-04 DIAGNOSIS — Z87440 Personal history of urinary (tract) infections: Secondary | ICD-10-CM

## 2019-04-04 DIAGNOSIS — N179 Acute kidney failure, unspecified: Secondary | ICD-10-CM | POA: Diagnosis not present

## 2019-04-04 DIAGNOSIS — E860 Dehydration: Secondary | ICD-10-CM

## 2019-04-04 DIAGNOSIS — M1990 Unspecified osteoarthritis, unspecified site: Secondary | ICD-10-CM | POA: Diagnosis not present

## 2019-04-04 DIAGNOSIS — I10 Essential (primary) hypertension: Secondary | ICD-10-CM

## 2019-04-04 DIAGNOSIS — Z8546 Personal history of malignant neoplasm of prostate: Secondary | ICD-10-CM | POA: Diagnosis not present

## 2019-04-04 DIAGNOSIS — Z436 Encounter for attention to other artificial openings of urinary tract: Secondary | ICD-10-CM

## 2019-04-04 DIAGNOSIS — C679 Malignant neoplasm of bladder, unspecified: Secondary | ICD-10-CM

## 2019-04-04 DIAGNOSIS — R000 Tachycardia, unspecified: Secondary | ICD-10-CM | POA: Diagnosis not present

## 2019-04-04 DIAGNOSIS — Z923 Personal history of irradiation: Secondary | ICD-10-CM | POA: Diagnosis not present

## 2019-04-04 DIAGNOSIS — R410 Disorientation, unspecified: Secondary | ICD-10-CM | POA: Diagnosis not present

## 2019-04-04 DIAGNOSIS — R531 Weakness: Secondary | ICD-10-CM

## 2019-04-04 DIAGNOSIS — Z79899 Other long term (current) drug therapy: Secondary | ICD-10-CM

## 2019-04-04 DIAGNOSIS — Z7901 Long term (current) use of anticoagulants: Secondary | ICD-10-CM

## 2019-04-04 DIAGNOSIS — C678 Malignant neoplasm of overlapping sites of bladder: Secondary | ICD-10-CM

## 2019-04-04 DIAGNOSIS — D72829 Elevated white blood cell count, unspecified: Secondary | ICD-10-CM

## 2019-04-04 DIAGNOSIS — N136 Pyonephrosis: Secondary | ICD-10-CM

## 2019-04-04 DIAGNOSIS — C61 Malignant neoplasm of prostate: Secondary | ICD-10-CM

## 2019-04-04 DIAGNOSIS — R5383 Other fatigue: Secondary | ICD-10-CM | POA: Diagnosis not present

## 2019-04-04 DIAGNOSIS — Z7189 Other specified counseling: Secondary | ICD-10-CM

## 2019-04-04 DIAGNOSIS — Z5112 Encounter for antineoplastic immunotherapy: Secondary | ICD-10-CM | POA: Diagnosis not present

## 2019-04-04 DIAGNOSIS — F419 Anxiety disorder, unspecified: Secondary | ICD-10-CM

## 2019-04-04 DIAGNOSIS — K5900 Constipation, unspecified: Secondary | ICD-10-CM | POA: Diagnosis not present

## 2019-04-04 LAB — COMPREHENSIVE METABOLIC PANEL
ALT: 24 U/L (ref 0–44)
AST: 24 U/L (ref 15–41)
Albumin: 2.9 g/dL — ABNORMAL LOW (ref 3.5–5.0)
Alkaline Phosphatase: 79 U/L (ref 38–126)
Anion gap: 13 (ref 5–15)
BUN: 27 mg/dL — ABNORMAL HIGH (ref 8–23)
CO2: 20 mmol/L — ABNORMAL LOW (ref 22–32)
Calcium: 8.8 mg/dL — ABNORMAL LOW (ref 8.9–10.3)
Chloride: 100 mmol/L (ref 98–111)
Creatinine, Ser: 1.84 mg/dL — ABNORMAL HIGH (ref 0.61–1.24)
GFR calc Af Amer: 39 mL/min — ABNORMAL LOW (ref >60)
GFR calc non Af Amer: 34 mL/min — ABNORMAL LOW (ref >60)
Glucose, Bld: 175 mg/dL — ABNORMAL HIGH (ref 70–99)
Potassium: 4.6 mmol/L (ref 3.5–5.1)
Sodium: 133 mmol/L — ABNORMAL LOW (ref 135–145)
Total Bilirubin: 0.4 mg/dL (ref 0.3–1.2)
Total Protein: 7.8 g/dL (ref 6.5–8.1)

## 2019-04-04 LAB — CBC WITH DIFFERENTIAL/PLATELET
Abs Immature Granulocytes: 0.72 10*3/uL — ABNORMAL HIGH (ref 0.00–0.07)
Basophils Absolute: 0.1 10*3/uL (ref 0.0–0.1)
Basophils Relative: 0 %
Eosinophils Absolute: 0.1 10*3/uL (ref 0.0–0.5)
Eosinophils Relative: 0 %
HCT: 28.7 % — ABNORMAL LOW (ref 39.0–52.0)
Hemoglobin: 9.2 g/dL — ABNORMAL LOW (ref 13.0–17.0)
Immature Granulocytes: 2 %
Lymphocytes Relative: 3 %
Lymphs Abs: 1 10*3/uL (ref 0.7–4.0)
MCH: 30.4 pg (ref 26.0–34.0)
MCHC: 32.1 g/dL (ref 30.0–36.0)
MCV: 94.7 fL (ref 80.0–100.0)
Monocytes Absolute: 1.3 10*3/uL — ABNORMAL HIGH (ref 0.1–1.0)
Monocytes Relative: 4 %
Neutro Abs: 29.7 10*3/uL — ABNORMAL HIGH (ref 1.7–7.7)
Neutrophils Relative %: 91 %
Platelets: 770 10*3/uL — ABNORMAL HIGH (ref 150–400)
RBC: 3.03 MIL/uL — ABNORMAL LOW (ref 4.22–5.81)
RDW: 14.5 % (ref 11.5–15.5)
WBC: 32.9 10*3/uL — ABNORMAL HIGH (ref 4.0–10.5)
nRBC: 0 % (ref 0.0–0.2)

## 2019-04-04 MED ORDER — HEPARIN SOD (PORK) LOCK FLUSH 100 UNIT/ML IV SOLN
500.0000 [IU] | Freq: Once | INTRAVENOUS | Status: AC
  Administered 2019-04-04: 500 [IU]

## 2019-04-04 MED ORDER — SODIUM CHLORIDE 0.9% FLUSH
10.0000 mL | Freq: Once | INTRAVENOUS | Status: AC
  Administered 2019-04-04: 10 mL via INTRAVENOUS
  Filled 2019-04-04: qty 10

## 2019-04-04 MED ORDER — SODIUM CHLORIDE 0.9 % IV SOLN
INTRAVENOUS | Status: DC
  Administered 2019-04-04: 11:00:00 via INTRAVENOUS
  Filled 2019-04-04 (×2): qty 250

## 2019-04-04 NOTE — Progress Notes (Signed)
Pt here for f/u after he had issues with stent. It had to be replaced. Pt finished atb yest. He said

## 2019-04-05 ENCOUNTER — Ambulatory Visit: Payer: Medicare Other | Admitting: Radiation Oncology

## 2019-04-05 LAB — URINE CULTURE: Culture: >=100000 — AB

## 2019-04-05 NOTE — Progress Notes (Signed)
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 For his prostate cancer he has received IM RT in the past. He has been seeing Dr. Erlene Quan in the past for his recurrent UTIs as well as hematuria. CT scan in July 2018 showed bladder wall thickening with perivascular edema and inflammation in the right ureter greater than the left. Findings were thought to be due to pyelonephritis at that time. He underwent cystoscopy on 12/14/2017 which showed abnormal looking prostate with necrotic material lining the entire surface area. Diffuse copious debris within the bladder appearing to be erythematous without discrete bladder tumor but visualization was poor. He was then admitted to the hospital on 12/26/2017 with symptoms of UTI and sepsis as well as new moderate bilateral hydronephrosis.  CT abdomen and pelvis with contrast on 12/20/2017 again showed market bile bladder wall thickening with perivesicular edema. Within the lumen of the bladder there is a 93.9 cm soft tissue attenuating filling defect. This is indeterminate and could represent an area of blood clot versus urothelial lesion.  He underwent repeat cystoscopy with bilateral pyelogram and ureteral stent placement as well as TURBT and TURP. He was found to have a massive tumor involving the majority of the bladder with grossly necrotic and calcified material. There  appeared to be multifocal disease with large burden of the left anterior bladder wall extending posteriorly as well as adjacent to the bladder neck and beyond the right hemitrigone. Very little normal recognizable bladder mucosa remaining.   Biopsy from TURBT and TURP showed: High-grade urothelial carcinoma with extensive necrosisinvolving both the bladder and the prostate.CT chest did not reveal any evidence of metastatic disease. He was also not found to have any regional adenopathy on CT abdomen  Patient was seen by Dr. Erlene Quan and was not deemed to be a surgical candidate. He has been referred to [place] for definitive treatment options.  Patient lives with his wife at home and ambulates with a cane. He does need assistance with his ADLs to some extent. He has not had any falls and he denies any changes in his appetite or unintentional weight loss. Denies any pain. Reports some fatigue and occasional problems with constipation. He has had 3 hospitalizations last year for urinary tract infections and currently has a chronic Foley for the last 1 month  Dr. Erlene Quan performed interval TURBT on 01/15/18 and was able to debulk tumor as much as possible to reduce tumor burden  Patient received 5 cycles ofcarboplatin/ gemcitabine 2 weeks on and 1 week off ending 04/26/18.Patient did receive radiation for 10 days during cycle 2 of treatment. Given patients age, co-morbidities and frailty- he was not a cisplatin candidate.6th cycle not given due to fall and hip fracture  Patient had a repeat cystoscopy in September 2019 which showed no obvious tumor bladder but it did have a shaggy necrotic appearance at the bladder neck. Prostatic fossa was grossly abnormal necrotic and irregular without papillary change. Bladder neck was biopsied and showed residual high-grade  urothelial carcinoma. Muscularis propria was not seen in that specimen.Due to evidence of recurrent/residual disease patient was  started on Tecentriq on 07/26/2018   Interval history-patient appears more fatigued.  He could not be stood up to be weighed but appears to have lost more weight.  I also spoke to his wife who states that he has been overall doing poorly.  Yesterday he had episodes of confusion.  He denies any pain.  No fever  ECOG PS- 3 Pain scale- 0 Opioid associated constipation- no  Review of systems- Review of Systems  Constitutional: Positive for malaise/fatigue and weight loss. Negative for chills and fever.  HENT: Negative for congestion, ear discharge and nosebleeds.   Eyes: Negative for blurred vision.  Respiratory: Negative for cough, hemoptysis, sputum production, shortness of breath and wheezing.   Cardiovascular: Negative for chest pain, palpitations, orthopnea and claudication.  Gastrointestinal: Negative for abdominal pain, blood in stool, constipation, diarrhea, heartburn, melena, nausea and vomiting.  Genitourinary: Negative for dysuria, flank pain, frequency, hematuria and urgency.  Musculoskeletal: Negative for back pain, joint pain and myalgias.  Skin: Negative for rash.  Neurological: Negative for dizziness, tingling, focal weakness, seizures, weakness and headaches.  Endo/Heme/Allergies: Does not bruise/bleed easily.  Psychiatric/Behavioral: Negative for depression and suicidal ideas. The patient does not have insomnia.       No Known Allergies   Past Medical History:  Diagnosis Date   Anxiety    Arthritis    Bladder cancer ([place])    Dysrhythmia 07/2018   history of atrial flutter that worsens with anxiety   Femur fracture, right ([place]) 05/01/2018   GERD (gastroesophageal reflux disease)    History of recent blood transfusion 05/2018   Hypertension    Iron deficiency anemia 09/14/2018   Prostate cancer ([place]) 07/2018   cancer growing in prostate but not prostate cancer, it is from the bladder   Umbilical hernia 15/1761   Urinary retention 2019   foley  catheter place 11/2017   UTI (urinary tract infection) 2019   frequent UTI's over last year   Wound eschar of foot 07/2018   left heal getting wrapped and requiring antibiotic cream. cracks open with weight bearing.     Past Surgical History:  Procedure Laterality Date   CARPAL TUNNEL RELEASE Right    CHOLECYSTECTOMY  2004   CYSTOGRAM  07/18/2018   Procedure: CYSTOGRAM;  Surgeon: [name], MD;  Location: [facility];  Service: Urology;;   CYSTOSCOPY W/ RETROGRADES Bilateral 07/18/2018   Procedure: CYSTOSCOPY WITH RETROGRADE PYELOGRAM;  Surgeon: [name], MD;  Location: [facility];  Service: Urology;  Laterality: Bilateral;   CYSTOSCOPY W/ URETERAL STENT PLACEMENT Bilateral 12/27/2017   Procedure: CYSTOSCOPY WITH RETROGRADE PYELOGRAM/URETERAL STENT PLACEMENT;  Surgeon: [name], MD;  Location: [facility];  Service: Urology;  Laterality: Bilateral;   CYSTOSCOPY W/ URETERAL STENT PLACEMENT Bilateral 07/18/2018   Procedure: CYSTOSCOPY WITH STENT REPLACEMENT (exchange);  Surgeon: [name], MD;  Location: [facility];  Service: Urology;  Laterality: Bilateral;   CYSTOSCOPY WITH STENT PLACEMENT Bilateral 11/12/2018   Procedure: [place] WITH STENT Exchange;  Surgeon: [name], MD;  Location: [facility];  Service: Urology;  Laterality: Bilateral;   INTRAMEDULLARY (IM) NAIL INTERTROCHANTERIC Right 05/02/2018   Procedure: INTRAMEDULLARY (IM) NAIL INTERTROCHANTRIC;  Surgeon: [name], MD;  Location: [facility];  Service: Orthopedics;  Laterality: Right;   IR NEPHROSTOMY EXCHANGE LEFT  03/25/2019   IR NEPHROSTOMY EXCHANGE RIGHT  03/25/2019   IR NEPHROSTOMY PLACEMENT LEFT  02/23/2019   IR  NEPHROSTOMY PLACEMENT RIGHT  02/23/2019   LEG TENDON SURGERY Right 1958   PORTA CATH INSERTION N/A 01/22/2018   Procedure: PORTA CATH INSERTION;  Surgeon: [name], MD;  Location: [facility];  Service: Cardiovascular;  Laterality: N/A;   TRANSURETHRAL RESECTION OF  BLADDER TUMOR N/A 12/27/2017   Procedure: TRANSURETHRAL RESECTION OF BLADDER TUMOR (TURBT);  Surgeon: [name], MD;  Location: [facility];  Service: Urology;  Laterality: N/A;   TRANSURETHRAL RESECTION OF BLADDER TUMOR N/A 01/15/2018   Procedure: TRANSURETHRAL RESECTION OF BLADDER TUMOR (TURBT);  Surgeon: [name], MD;  Location: [facility];  Service: Urology;  Laterality: N/A;  Need 2 hrs for this case please   TRANSURETHRAL RESECTION OF BLADDER TUMOR N/A 07/18/2018   Procedure: TRANSURETHRAL RESECTION OF BLADDER TUMOR (TURBT);  Surgeon: [name], MD;  Location: [facility];  Service: Urology;  Laterality: N/A;    Social History   Socioeconomic History   Marital status: Married    Spouse name: ruth   Number of children: Not on file   Years of education: Not on file   Highest education level: Not on file  Occupational History   Occupation: retired    Comment: Therapist, nutritional [place] resource strain: Not on file   Food insecurity:    Worry: Not on file    Inability: Not on file   Transportation needs:    Medical: Not on file    Non-medical: Not on file  Tobacco Use   Smoking status: Never Smoker   Smokeless tobacco: Never Used  Substance and Sexual Activity   Alcohol use: No    Alcohol/week: 0.0 standard drinks   Drug use: No   Sexual activity: Not Currently  Lifestyle   Physical activity:    Days per week: Not on file    Minutes per session: Not on file   Stress: Not on file  Relationships   Social connections:    Talks on phone: Not on file    Gets together: Not on file    Attends religious service: Not on file    Active member of club or organization: Not on file    Attends meetings of clubs or organizations: Not on file    Relationship status: Not on file   Intimate partner violence:    Fear of current or ex partner: Not on file    Emotionally abused: Not on file    Physically abused: Not on file    Forced sexual  activity: Not on file  Other Topics Concern   Not on file  Social History Narrative   Not on file    Family History  Problem Relation Age of Onset   Cancer Mother    Chronic Renal Failure Mother    Heart disease Father      Current Outpatient Medications:    acetaminophen (TYLENOL) 500 MG tablet, Take 1,000 mg by mouth every 6 (six) hours as needed for moderate pain or headache. , Disp: , Rfl:    cetirizine (ZYRTEC) 10 MG tablet, Take 10 mg by mouth daily as needed for allergies. , Disp: , Rfl:    Cranberry-Cholecalciferol (SUPER CRANBERRY/VITAMIN D3) 4200-500 MG-UNIT CAPS, Take 1 capsule by mouth 2 (two) times daily., Disp: , Rfl:    ELIQUIS 5 MG TABS tablet, TAKE ONE TABLET BY MOUTH TWICE DAILY (Patient taking differently: Take 5 mg by mouth 2 (two) times daily. ), Disp: 60 tablet, Rfl: 3   ferrous sulfate 325 (  65 FE) MG tablet, Take 1 tablet (325 mg total) by mouth 2 (two) times daily with a meal., Disp: , Rfl: 3   lidocaine-prilocaine (EMLA) cream, Apply 1 application topically as needed., Disp: 30 g, Rfl: 1   Multiple Vitamin (MULTIVITAMIN WITH MINERALS) TABS tablet, Take 1 tablet by mouth daily., Disp: , Rfl:    MYRBETRIQ 50 MG TB24 tablet, TAKE ONE TABLET EVERY DAY (Patient taking differently: Take 50 mg by mouth daily. ), Disp: 30 tablet, Rfl: 3   nystatin (NYAMYC) powder, Apply topically 2 (two) times daily., Disp: 60 g, Rfl: 3   oxyCODONE (OXY IR/ROXICODONE) 5 MG immediate release tablet, Take 1 tablet (5 mg total) by mouth every 4 (four) hours as needed for severe pain., Disp: 60 tablet, Rfl: 0   polyethylene glycol powder (MIRALAX) powder, Take 17 g by mouth daily as needed. Can increase to 3 times a day as needed for constipation but hold medication if has diarrhea, Disp: 255 g, Rfl: 0   potassium chloride SA (K-DUR) 20 MEQ tablet, Take 20 mEq by mouth 2 (two) times daily., Disp: , Rfl:    vitamin B-12 (CYANOCOBALAMIN) 500 MCG tablet, Take 500 mcg by mouth  daily., Disp: , Rfl:    zinc oxide 20 % ointment, Apply 1 application topically as needed for irritation., Disp: , Rfl:   Physical exam:  Vitals:   04/04/19 1031  BP: 123/73  Pulse: (!) 121  Resp: 18  Temp: (!) 95.7 F (35.4 C)  TempSrc: Tympanic  Height: 5\' 11"  (1.803 m)   Physical Exam Constitutional:      Comments: Frail elderly gentleman sitting in a wheelchair.  Appears tremulous  HENT:     Head: Normocephalic and atraumatic.  Eyes:     Pupils: Pupils are equal, round, and reactive to light.  Neck:     Musculoskeletal: Normal range of motion.  Cardiovascular:     Rate and Rhythm: Regular rhythm. Tachycardia present.     Heart sounds: Normal heart sounds.  Pulmonary:     Effort: Pulmonary effort is normal.     Breath sounds: Normal breath sounds.  Abdominal:     General: Bowel sounds are normal.     Palpations: Abdomen is soft.     Comments: Bilateral nephrostomy tubes draining clear urine  Skin:    General: Skin is warm and dry.  Neurological:     Mental Status: He is alert and oriented to person, place, and time.      CMP Latest Ref Rng & Units 04/04/2019  Glucose 70 - 99 mg/dL 175(H)  BUN 8 - 23 mg/dL 27(H)  Creatinine 0.61 - 1.24 mg/dL 1.84(H)  Sodium 135 - 145 mmol/L 133(L)  Potassium 3.5 - 5.1 mmol/L 4.6  Chloride 98 - 111 mmol/L 100  CO2 22 - 32 mmol/L 20(L)  Calcium 8.9 - 10.3 mg/dL 8.8(L)  Total Protein 6.5 - 8.1 g/dL 7.8  Total Bilirubin 0.3 - 1.2 mg/dL 0.4  Alkaline Phos 38 - 126 U/L 79  AST 15 - 41 U/L 24  ALT 0 - 44 U/L 24   CBC Latest Ref Rng & Units 04/04/2019  WBC 4.0 - 10.5 K/uL 32.9(H)  Hemoglobin 13.0 - 17.0 g/dL 9.2(L)  Hematocrit 39.0 - 52.0 % 28.7(L)  Platelets 150 - 400 K/uL 770(H)    No images are attached to the encounter.  Ct Renal Stone Study  Result Date: 03/22/2019 CLINICAL DATA:  Left kidney catheter would not drain EXAM: CT ABDOMEN AND PELVIS WITHOUT  CONTRAST TECHNIQUE: Multidetector CT imaging of the abdomen and  pelvis was performed following the standard protocol without IV contrast. COMPARISON:  CT abdomen pelvis, 02/21/2019 FINDINGS: Lower chest: No acute abnormality. Three-vessel coronary artery calcifications. Hepatobiliary: No focal liver abnormality is seen. Status post cholecystectomy. No biliary dilatation. Pancreas: Unremarkable. No pancreatic ductal dilatation or surrounding inflammatory changes. Spleen: Normal in size without focal abnormality. Adrenals/Urinary Tract: Adrenal glands are unremarkable. There has been interval placement of bilateral percutaneous nephrostomy catheters, with formed pigtails in the bilateral renal pelves. There is moderate left hydronephrosis and hydroureter, with decompression of the right collecting system. Redemonstrated, large, partially necrotic mass of the bladder. There has been interval removal of a previously seen Foley catheter. There is air and fluid within the bladder lumen. Stomach/Bowel: Stomach is within normal limits. No evidence of bowel wall thickening, distention, or inflammatory changes. Large burden of stool in the colon. Vascular/Lymphatic: Scattered calcific atherosclerosis. No enlarged abdominal or pelvic lymph nodes. Reproductive: No mass or other abnormality. Other: No abdominal wall hernia or abnormality. No abdominopelvic ascites. Musculoskeletal: No acute or significant osseous findings. Severe osteopenia. IMPRESSION: 1. There has been interval placement of bilateral percutaneous nephrostomy catheters, with formed pigtails in the bilateral renal pelves. There is moderate left hydronephrosis and hydroureter similar to prior examination, with decompression of the right collecting system. 2. Redemonstrated, large, partially necrotic mass of the bladder. There has been interval removal of a previously seen Foley catheter. There is air and fluid within the bladder lumen. Electronically Signed   By: Eddie Candle M.D.   On: 03/22/2019 19:12   Ir Nephrostomy  Exchange Left  Result Date: 03/25/2019 INDICATION: 84 year old male with a history of nonfunctional percutaneous nephrostomy EXAM: IR EXCHANGE NEPHROSTOMY LEFT; IR EXCHANGE NEPHROSTOMY RIGHT COMPARISON:  None. MEDICATIONS: None ANESTHESIA/SEDATION: None CONTRAST:  50mL OMNIPAQUE IOHEXOL 300 MG/ML SOLN - administered into the collecting system(s) FLUOROSCOPY TIME:  Fluoroscopy Time: 1 minutes 6 seconds (16.8 mGy). COMPLICATIONS: None PROCEDURE: Informed written consent was obtained from the patient after a thorough discussion of the procedural risks, benefits and alternatives. All questions were addressed. Maximal Sterile Barrier Technique was utilized including caps, mask, sterile gowns, sterile gloves, sterile drape, hand hygiene and skin antiseptic. A timeout was performed prior to the initiation of the procedure. Patient positioned in the supine position on the fluoroscopy table. The patient was prepped and draped in the usual sterile fashion. Left: 1% lidocaine was used for local anesthesia. Small amount of contrast was infused confirming location in the collecting system. Modified Seldinger technique was then used to exchange for a new 10 [place] percutaneous nephrostomy. Catheter was formed in the collecting system and contrast confirmed location. Catheter was sutured in location and patch to gravity drainage. Right: 1% lidocaine was used for local anesthesia. Small amount of contrast was infused confirming location in the collecting system. Modified Seldinger technique was then used to exchange for a new 10 [place] percutaneous nephrostomy. Catheter was formed in the collecting system and contrast confirmed location. Catheter was sutured in location and patch to gravity drainage. Patient tolerated the procedure well and remained hemodynamically stable throughout. No complications were encountered and no significant blood loss. IMPRESSION: Status post exchange of bilateral percutaneous nephrostomy. Signed,  Dulcy Fanny. Dellia Nims, RPVI [facility] Radiology Electronically Signed   By: Corrie Mckusick D.O.   On: 03/25/2019 14:51   Ir Nephrostomy Exchange Right  Result Date: 03/25/2019 INDICATION: 84 year old male with a history of nonfunctional percutaneous nephrostomy EXAM:  IR EXCHANGE NEPHROSTOMY LEFT; IR EXCHANGE NEPHROSTOMY RIGHT COMPARISON:  None. MEDICATIONS: None ANESTHESIA/SEDATION: None CONTRAST:  18mL OMNIPAQUE IOHEXOL 300 MG/ML SOLN - administered into the collecting system(s) FLUOROSCOPY TIME:  Fluoroscopy Time: 1 minutes 6 seconds (16.8 mGy). COMPLICATIONS: None PROCEDURE: Informed written consent was obtained from the patient after a thorough discussion of the procedural risks, benefits and alternatives. All questions were addressed. Maximal Sterile Barrier Technique was utilized including caps, mask, sterile gowns, sterile gloves, sterile drape, hand hygiene and skin antiseptic. A timeout was performed prior to the initiation of the procedure. Patient positioned in the supine position on the fluoroscopy table. The patient was prepped and draped in the usual sterile fashion. Left: 1% lidocaine was used for local anesthesia. Small amount of contrast was infused confirming location in the collecting system. Modified Seldinger technique was then used to exchange for a new 10 [place] percutaneous nephrostomy. Catheter was formed in the collecting system and contrast confirmed location. Catheter was sutured in location and patch to gravity drainage. Right: 1% lidocaine was used for local anesthesia. Small amount of contrast was infused confirming location in the collecting system. Modified Seldinger technique was then used to exchange for a new 10 [place] percutaneous nephrostomy. Catheter was formed in the collecting system and contrast confirmed location. Catheter was sutured in location and patch to gravity drainage. Patient tolerated the procedure well and  remained hemodynamically stable throughout. No complications were encountered and no significant blood loss. IMPRESSION: Status post exchange of bilateral percutaneous nephrostomy. Signed, Dulcy Fanny. Dellia Nims, RPVI [facility] Radiology Electronically Signed   By: Corrie Mckusick D.O.   On: 03/25/2019 14:51     Assessment and plan- Patient is a 84 y.o. 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  He is here for on treatment assessment prior to cycle 10 of maintenance Tecentriq  Patient looks weaker and seems to have lost more weight over the last 2 weeks.  He was again recently hospitalized for nephrostomy tube malfunction which has now been fixed.  Patient's wife also endorses that he is getting weaker and is having difficulty ambulating around the house.  She reports having multiple episodes of confusion over the last 1 day.  Given his significant tachycardia I will go ahead and check a urinalysis and urine culture from his nephrostomy tube specimen to see if he is coming up with another urinary tract infection.  We will also give him 1 L of IV fluids in the clinic today.  Although Tecentriq has been keeping his disease under check with no overt evidence of metastases outside his bladder; patient's performance status continues to decline.  He will not be getting Tecentriq next week as planned.  Discussed that hospice would be a reasonable option to consider at this time.  Patient's wife wants to think about it.  We agreed to give him 3 to 4 weeks at home to see how he does.  If he improves we could consider resuming Tecentriq.  However if he continues to remain significantly fatigued she is willing to consider hospice at that time.  His overall prognosis is poor.   Total face to face encounter time for this  patient visit was 40 min. >50% of the time was  spent in counseling and coordination of care.     Visit Diagnosis 1. Goals of care, counseling/discussion   2.  Malignant neoplasm of urinary bladder, unspecified site [facility])      Dr. Randa Evens, MD, MPH [facility] [phone] 04/05/2019 10:41 AM

## 2019-04-05 NOTE — Telephone Encounter (Signed)
error 

## 2019-04-08 ENCOUNTER — Inpatient Hospital Stay
Admit: 2019-04-08 | Discharge: 2019-04-08 | Disposition: A | Discharge status: Home / Self Care | Payer: Medicare Other | Attending: Student in an Organized Health Care Education/Training Program | Admitting: Student in an Organized Health Care Education/Training Program

## 2019-04-08 ENCOUNTER — Emergency Department: Payer: Medicare Other

## 2019-04-08 ENCOUNTER — Observation Stay
Admission: EM | Admit: 2019-04-08 | Discharge: 2019-04-10 | Disposition: A | Discharge status: Home / Self Care | Payer: Medicare Other | Attending: Internal Medicine | Admitting: Internal Medicine

## 2019-04-08 ENCOUNTER — Other Ambulatory Visit: Payer: Self-pay

## 2019-04-08 DIAGNOSIS — H9190 Unspecified hearing loss, unspecified ear: Secondary | ICD-10-CM | POA: Diagnosis not present

## 2019-04-08 DIAGNOSIS — Z7901 Long term (current) use of anticoagulants: Secondary | ICD-10-CM | POA: Insufficient documentation

## 2019-04-08 DIAGNOSIS — D72828 Other elevated white blood cell count: Secondary | ICD-10-CM | POA: Diagnosis not present

## 2019-04-08 DIAGNOSIS — Z923 Personal history of irradiation: Secondary | ICD-10-CM | POA: Diagnosis not present

## 2019-04-08 DIAGNOSIS — Y929 Unspecified place or not applicable: Secondary | ICD-10-CM | POA: Diagnosis not present

## 2019-04-08 DIAGNOSIS — F419 Anxiety disorder, unspecified: Secondary | ICD-10-CM | POA: Insufficient documentation

## 2019-04-08 DIAGNOSIS — Z9049 Acquired absence of other specified parts of digestive tract: Secondary | ICD-10-CM | POA: Diagnosis not present

## 2019-04-08 DIAGNOSIS — Z1159 Encounter for screening for other viral diseases: Secondary | ICD-10-CM | POA: Insufficient documentation

## 2019-04-08 DIAGNOSIS — N136 Pyonephrosis: Principal | ICD-10-CM | POA: Insufficient documentation

## 2019-04-08 DIAGNOSIS — Z86718 Personal history of other venous thrombosis and embolism: Secondary | ICD-10-CM | POA: Insufficient documentation

## 2019-04-08 DIAGNOSIS — N1330 Unspecified hydronephrosis: Secondary | ICD-10-CM | POA: Diagnosis present

## 2019-04-08 DIAGNOSIS — Z79899 Other long term (current) drug therapy: Secondary | ICD-10-CM | POA: Insufficient documentation

## 2019-04-08 DIAGNOSIS — Y838 Other surgical procedures as the cause of abnormal reaction of the patient, or of later complication, without mention of misadventure at the time of the procedure: Secondary | ICD-10-CM | POA: Diagnosis not present

## 2019-04-08 DIAGNOSIS — C679 Malignant neoplasm of bladder, unspecified: Secondary | ICD-10-CM | POA: Diagnosis not present

## 2019-04-08 DIAGNOSIS — Z8546 Personal history of malignant neoplasm of prostate: Secondary | ICD-10-CM | POA: Insufficient documentation

## 2019-04-08 DIAGNOSIS — M6281 Muscle weakness (generalized): Secondary | ICD-10-CM | POA: Diagnosis not present

## 2019-04-08 DIAGNOSIS — Z8249 Family history of ischemic heart disease and other diseases of the circulatory system: Secondary | ICD-10-CM | POA: Insufficient documentation

## 2019-04-08 DIAGNOSIS — Z9221 Personal history of antineoplastic chemotherapy: Secondary | ICD-10-CM | POA: Diagnosis not present

## 2019-04-08 DIAGNOSIS — T83092A Other mechanical complication of nephrostomy catheter, initial encounter: Secondary | ICD-10-CM | POA: Insufficient documentation

## 2019-04-08 DIAGNOSIS — M1990 Unspecified osteoarthritis, unspecified site: Secondary | ICD-10-CM | POA: Insufficient documentation

## 2019-04-08 DIAGNOSIS — K219 Gastro-esophageal reflux disease without esophagitis: Secondary | ICD-10-CM | POA: Insufficient documentation

## 2019-04-08 DIAGNOSIS — D509 Iron deficiency anemia, unspecified: Secondary | ICD-10-CM | POA: Diagnosis not present

## 2019-04-08 DIAGNOSIS — I10 Essential (primary) hypertension: Secondary | ICD-10-CM | POA: Diagnosis not present

## 2019-04-08 DIAGNOSIS — R109 Unspecified abdominal pain: Secondary | ICD-10-CM | POA: Diagnosis present

## 2019-04-08 DIAGNOSIS — R262 Difficulty in walking, not elsewhere classified: Secondary | ICD-10-CM | POA: Diagnosis not present

## 2019-04-08 LAB — CBC WITH DIFFERENTIAL/PLATELET
Abs Immature Granulocytes: 0.65 10*3/uL — ABNORMAL HIGH (ref 0.00–0.07)
Basophils Absolute: 0.2 10*3/uL — ABNORMAL HIGH (ref 0.0–0.1)
Basophils Relative: 1 %
Eosinophils Absolute: 0.2 10*3/uL (ref 0.0–0.5)
Eosinophils Relative: 1 %
HCT: 28 % — ABNORMAL LOW (ref 39.0–52.0)
Hemoglobin: 8.6 g/dL — ABNORMAL LOW (ref 13.0–17.0)
Immature Granulocytes: 2 %
Lymphocytes Relative: 7 %
Lymphs Abs: 2.1 10*3/uL (ref 0.7–4.0)
MCH: 29.7 pg (ref 26.0–34.0)
MCHC: 30.7 g/dL (ref 30.0–36.0)
MCV: 96.6 fL (ref 80.0–100.0)
Monocytes Absolute: 1.4 10*3/uL — ABNORMAL HIGH (ref 0.1–1.0)
Monocytes Relative: 5 %
Neutro Abs: 26.1 10*3/uL — ABNORMAL HIGH (ref 1.7–7.7)
Neutrophils Relative %: 84 %
Platelets: 504 10*3/uL — ABNORMAL HIGH (ref 150–400)
RBC: 2.9 MIL/uL — ABNORMAL LOW (ref 4.22–5.81)
RDW: 15.6 % — ABNORMAL HIGH (ref 11.5–15.5)
Smear Review: NORMAL
WBC: 30.6 10*3/uL — ABNORMAL HIGH (ref 4.0–10.5)
nRBC: 0 % (ref 0.0–0.2)

## 2019-04-08 LAB — COMPREHENSIVE METABOLIC PANEL
ALT: 14 U/L (ref 0–44)
AST: 13 U/L — ABNORMAL LOW (ref 15–41)
Albumin: 2.5 g/dL — ABNORMAL LOW (ref 3.5–5.0)
Alkaline Phosphatase: 80 U/L (ref 38–126)
Anion gap: 9 (ref 5–15)
BUN: 28 mg/dL — ABNORMAL HIGH (ref 8–23)
CO2: 21 mmol/L — ABNORMAL LOW (ref 22–32)
Calcium: 8.4 mg/dL — ABNORMAL LOW (ref 8.9–10.3)
Chloride: 105 mmol/L (ref 98–111)
Creatinine, Ser: 1.7 mg/dL — ABNORMAL HIGH (ref 0.61–1.24)
GFR calc Af Amer: 43 mL/min — ABNORMAL LOW (ref >60)
GFR calc non Af Amer: 37 mL/min — ABNORMAL LOW (ref >60)
Glucose, Bld: 136 mg/dL — ABNORMAL HIGH (ref 70–99)
Potassium: 4.1 mmol/L (ref 3.5–5.1)
Sodium: 135 mmol/L (ref 135–145)
Total Bilirubin: 0.3 mg/dL (ref 0.3–1.2)
Total Protein: 7 g/dL (ref 6.5–8.1)

## 2019-04-08 LAB — URINALYSIS, COMPLETE (UACMP) WITH MICROSCOPIC
Bilirubin Urine: NEGATIVE
Glucose, UA: NEGATIVE mg/dL
Ketones, ur: NEGATIVE mg/dL
Nitrite: NEGATIVE
Protein, ur: 100 mg/dL — AB
RBC / HPF: >50 RBC/hpf — ABNORMAL HIGH (ref 0–5)
Specific Gravity, Urine: 1.017 (ref 1.005–1.030)
WBC, UA: >50 WBC/hpf — ABNORMAL HIGH (ref 0–5)
pH: 5 (ref 5.0–8.0)

## 2019-04-08 LAB — SARS CORONAVIRUS 2 BY RT PCR (HOSPITAL ORDER, PERFORMED IN ~~LOC~~ HOSPITAL LAB): SARS Coronavirus 2: NEGATIVE

## 2019-04-08 MED ORDER — FLUCONAZOLE IN SODIUM CHLORIDE 200-0.9 MG/100ML-% IV SOLN
200.0000 mg | INTRAVENOUS | Status: DC
  Administered 2019-04-08: 15:00:00 200 mg via INTRAVENOUS
  Filled 2019-04-08 (×3): qty 100

## 2019-04-08 MED ORDER — SODIUM CHLORIDE 0.9 % IV SOLN
1.0000 g | Freq: Every day | INTRAVENOUS | Status: DC
  Administered 2019-04-08 – 2019-04-09 (×2): 1 g via INTRAVENOUS
  Filled 2019-04-08 (×2): qty 10
  Filled 2019-04-08: qty 1

## 2019-04-08 MED ORDER — MIRABEGRON ER 50 MG PO TB24
50.0000 mg | ORAL_TABLET | Freq: Every day | ORAL | Status: DC
  Administered 2019-04-08 – 2019-04-10 (×3): 50 mg via ORAL
  Filled 2019-04-08 (×3): qty 1

## 2019-04-08 MED ORDER — DOCUSATE SODIUM 100 MG PO CAPS
100.0000 mg | ORAL_CAPSULE | Freq: Two times a day (BID) | ORAL | Status: DC
  Administered 2019-04-08 – 2019-04-10 (×5): 100 mg via ORAL
  Filled 2019-04-08 (×5): qty 1

## 2019-04-08 MED ORDER — FERROUS SULFATE 325 (65 FE) MG PO TABS
325.0000 mg | ORAL_TABLET | Freq: Two times a day (BID) | ORAL | Status: DC
  Administered 2019-04-08 – 2019-04-10 (×4): 325 mg via ORAL
  Filled 2019-04-08 (×4): qty 1

## 2019-04-08 MED ORDER — APIXABAN 5 MG PO TABS
5.0000 mg | ORAL_TABLET | Freq: Two times a day (BID) | ORAL | Status: DC
  Administered 2019-04-08 – 2019-04-10 (×5): 5 mg via ORAL
  Filled 2019-04-08 (×5): qty 1

## 2019-04-08 MED ORDER — CYANOCOBALAMIN 500 MCG PO TABS
500.0000 ug | ORAL_TABLET | Freq: Every day | ORAL | Status: DC
  Administered 2019-04-08 – 2019-04-10 (×3): 500 ug via ORAL
  Filled 2019-04-08 (×3): qty 1

## 2019-04-08 MED ORDER — ACETAMINOPHEN 650 MG RE SUPP: 650.0000 mg | Freq: Four times a day (QID) | RECTAL | Status: DC | PRN

## 2019-04-08 MED ORDER — SODIUM CHLORIDE 0.9 % IV SOLN
INTRAVENOUS | Status: DC
  Administered 2019-04-08 – 2019-04-09 (×2): via INTRAVENOUS

## 2019-04-08 MED ORDER — ACETAMINOPHEN 325 MG PO TABS: 650.0000 mg | ORAL_TABLET | Freq: Four times a day (QID) | ORAL | Status: DC | PRN

## 2019-04-08 MED ORDER — ONDANSETRON HCL 4 MG PO TABS: 4.0000 mg | ORAL_TABLET | Freq: Four times a day (QID) | ORAL | Status: DC | PRN

## 2019-04-08 MED ORDER — POLYETHYLENE GLYCOL 3350 17 G PO PACK: 17.0000 g | PACK | Freq: Every day | ORAL | Status: DC | PRN

## 2019-04-08 MED ORDER — ONDANSETRON HCL 4 MG/2ML IJ SOLN: 4.0000 mg | Freq: Four times a day (QID) | INTRAMUSCULAR | Status: DC | PRN

## 2019-04-08 MED ORDER — OXYCODONE HCL 5 MG PO TABS
5.0000 mg | ORAL_TABLET | ORAL | Status: DC | PRN
  Administered 2019-04-08: 09:00:00 5 mg via ORAL
  Filled 2019-04-08: qty 1

## 2019-04-08 NOTE — ED Notes (Signed)
Pt transported to IR with IR nurse

## 2019-04-08 NOTE — ED Triage Notes (Addendum)
Pt arrived POV with c/o right mid back pain; history of prostate and bladder cancer; pt has nephrostomy tubes on both sides of mid back; pt says the left tube is draining but the right side is not; this is causing him pain; pt was admitted to [facility] 5/15 and tubes were changed on 5/18; pt has also been admitted before for sepsis; pt is very [facility]

## 2019-04-08 NOTE — ED Notes (Addendum)
Pt informed unable to eat at this time r/t possible procedure today. Verbalized understanding.  Called and updated wife with patients permission.

## 2019-04-08 NOTE — H&P (Signed)
[facility] NAME: Vincent Poole    MR#:  [id number]  DATE OF BIRTH:  1935/02/02  DATE OF ADMISSION:  04/08/2019  PRIMARY CARE PHYSICIAN: [name], MD   REQUESTING/REFERRING PHYSICIAN: Dr. Lavonia Drafts  CHIEF COMPLAINT:   Chief Complaint  Patient presents with   Back Pain    HISTORY OF PRESENT ILLNESS:  Vincent Poole  is a 84 y.o. male with a known history of locally invasive high-grade urothelial cancer status post bilateral nephrostomy tubes, history of right leg DVT in March 2020 on Eliquis, hypertension, GERD, arthritis and prior history of prostate cancer comes from home secondary to worsening abdominal and right flank pain. Patient was in the hospital 2 weeks ago for UTI and had bilateral nephrostomy tubes exchanged by IR at that time.  He had Serratia and Klebsiella in urine and was treated with antibiotics.  Next week patient has seen his oncologist and the plan is to hold his Tecentriq and monitor his performance status at home over the next month.  There were plans to consider hospice if he continues to decline.  Patient has not been ambulating much at home and has had intermittent confusion lately.  He has had frequent UTIs and may be some chronic colonization.  Since yesterday has been complaining of worsening abdominal pain and right flank pain intermittently.  Also noted that his right nephrostomy tube was not draining for almost a day.  In the emergency room, he did not have any fevers, nausea or vomiting.  He complains of bilateral leg aches.  After flushing the right nephrostomy tube started draining.  He is being admitted under observation for possible UTI and IR to exchange his nephrostomy tubes.  PAST MEDICAL HISTORY:   Past Medical History:  Diagnosis Date   Anxiety    Arthritis    Bladder cancer ([place])    Dysrhythmia 07/2018   history of atrial flutter that worsens with anxiety   Femur  fracture, right ([place]) 05/01/2018   GERD (gastroesophageal reflux disease)    History of recent blood transfusion 05/2018   Hypertension    Iron deficiency anemia 09/14/2018   Prostate cancer ([place]) 07/2018   cancer growing in prostate but not prostate cancer, it is from the bladder   Umbilical hernia 70/9643   Urinary retention 2019   foley catheter place 11/2017   UTI (urinary tract infection) 2019   frequent UTI's over last year   Wound eschar of foot 07/2018   left heal getting wrapped and requiring antibiotic cream. cracks open with weight bearing.    PAST SURGICAL HISTORY:   Past Surgical History:  Procedure Laterality Date   CARPAL TUNNEL RELEASE Right    CHOLECYSTECTOMY  2004   CYSTOGRAM  07/18/2018   Procedure: CYSTOGRAM;  Surgeon: [name], MD;  Location: [facility];  Service: Urology;;   Consuela Mimes W/ RETROGRADES Bilateral 07/18/2018   Procedure: CYSTOSCOPY WITH RETROGRADE PYELOGRAM;  Surgeon: [name], MD;  Location: [facility];  Service: Urology;  Laterality: Bilateral;   CYSTOSCOPY W/ URETERAL STENT PLACEMENT Bilateral 12/27/2017   Procedure: CYSTOSCOPY WITH RETROGRADE PYELOGRAM/URETERAL STENT PLACEMENT;  Surgeon: [name], MD;  Location: [facility];  Service: Urology;  Laterality: Bilateral;   CYSTOSCOPY W/ URETERAL STENT PLACEMENT Bilateral 07/18/2018   Procedure: CYSTOSCOPY WITH STENT REPLACEMENT (exchange);  Surgeon: [name], MD;  Location: [facility];  Service: Urology;  Laterality: Bilateral;   CYSTOSCOPY WITH STENT PLACEMENT Bilateral 11/12/2018   Procedure: CYSTOSCOPY  WITH STENT Exchange;  Surgeon: [name], MD;  Location: [facility];  Service: Urology;  Laterality: Bilateral;   INTRAMEDULLARY (IM) NAIL INTERTROCHANTERIC Right 05/02/2018   Procedure: INTRAMEDULLARY (IM) NAIL INTERTROCHANTRIC;  Surgeon: [name], MD;  Location: [facility];  Service: Orthopedics;  Laterality: Right;   IR NEPHROSTOMY EXCHANGE LEFT  03/25/2019     IR NEPHROSTOMY EXCHANGE RIGHT  03/25/2019   IR NEPHROSTOMY PLACEMENT LEFT  02/23/2019   IR NEPHROSTOMY PLACEMENT RIGHT  02/23/2019   LEG TENDON SURGERY Right 1958   PORTA CATH INSERTION N/A 01/22/2018   Procedure: PORTA CATH INSERTION;  Surgeon: [name], MD;  Location: [facility];  Service: Cardiovascular;  Laterality: N/A;   TRANSURETHRAL RESECTION OF BLADDER TUMOR N/A 12/27/2017   Procedure: TRANSURETHRAL RESECTION OF BLADDER TUMOR (TURBT);  Surgeon: [name], MD;  Location: [facility];  Service: Urology;  Laterality: N/A;   TRANSURETHRAL RESECTION OF BLADDER TUMOR N/A 01/15/2018   Procedure: TRANSURETHRAL RESECTION OF BLADDER TUMOR (TURBT);  Surgeon: [name], MD;  Location: [facility];  Service: Urology;  Laterality: N/A;  Need 2 hrs for this case please   TRANSURETHRAL RESECTION OF BLADDER TUMOR N/A 07/18/2018   Procedure: TRANSURETHRAL RESECTION OF BLADDER TUMOR (TURBT);  Surgeon: [name], MD;  Location: [facility];  Service: Urology;  Laterality: N/A;    SOCIAL HISTORY:   Social History   Tobacco Use   Smoking status: Never Smoker   Smokeless tobacco: Never Used  Substance Use Topics   Alcohol use: No    Alcohol/week: 0.0 standard drinks    FAMILY HISTORY:   Family History  Problem Relation Age of Onset   Cancer Mother    Chronic Renal Failure Mother    Heart disease Father     DRUG ALLERGIES:  No Known Allergies  REVIEW OF SYSTEMS:   Review of Systems  Constitutional: Positive for malaise/fatigue and weight loss. Negative for chills and fever.  HENT: Negative for ear discharge, ear pain, hearing loss and nosebleeds.   Eyes: Negative for blurred vision, double vision and photophobia.  Respiratory: Negative for cough, hemoptysis, shortness of breath and wheezing.   Cardiovascular: Positive for leg swelling. Negative for chest pain, palpitations and orthopnea.  Gastrointestinal: Positive for abdominal pain. Negative for  constipation, diarrhea, heartburn, melena, nausea and vomiting.  Genitourinary: Negative for dysuria, frequency, hematuria and urgency.  Musculoskeletal: Positive for joint pain and myalgias. Negative for back pain and neck pain.  Skin: Negative for rash.  Neurological: Negative for dizziness, tingling, tremors, sensory change, speech change, focal weakness and headaches.  Endo/Heme/Allergies: Does not bruise/bleed easily.  Psychiatric/Behavioral: Negative for depression.    MEDICATIONS AT HOME:   Prior to Admission medications   Medication Sig Start Date End Date Taking? Authorizing Provider  Cranberry-Cholecalciferol (SUPER CRANBERRY/VITAMIN D3) 4200-500 MG-UNIT CAPS Take 1 capsule by mouth 2 (two) times daily.   Yes [provider]  ELIQUIS 5 MG TABS tablet TAKE ONE TABLET BY MOUTH TWICE DAILY Patient taking differently: Take 5 mg by mouth 2 (two) times daily.  01/30/19  Yes Cammie Sickle, MD  ferrous sulfate 325 (65 FE) MG tablet Take 1 tablet (325 mg total) by mouth 2 (two) times daily with a meal. 05/06/18  Yes Gouru, Aruna, MD  Multiple Vitamin (MULTIVITAMIN WITH MINERALS) TABS tablet Take 1 tablet by mouth daily.   Yes [provider]  MYRBETRIQ 50 MG TB24 tablet TAKE ONE TABLET EVERY DAY Patient taking differently: Take 50 mg by mouth daily.  01/02/19  Yes McGowan, Larene Beach A, PA-C  oxyCODONE (OXY IR/ROXICODONE) 5 MG immediate release tablet Take 1 tablet (5 mg total) by mouth every 4 (four) hours as needed for severe pain. 03/21/19  Yes Sindy Guadeloupe, MD  potassium chloride SA (K-DUR) 20 MEQ tablet Take 20 mEq by mouth 2 (two) times daily.   Yes [provider]  vitamin B-12 (CYANOCOBALAMIN) 500 MCG tablet Take 500 mcg by mouth daily.   Yes [provider]  acetaminophen (TYLENOL) 500 MG tablet Take 1,000 mg by mouth every 6 (six) hours as needed for moderate pain or headache.     [provider]  cetirizine (ZYRTEC) 10 MG tablet Take  10 mg by mouth daily as needed for allergies.     [provider]  lidocaine-prilocaine (EMLA) cream Apply 1 application topically as needed. 01/02/19   Sindy Guadeloupe, MD  nystatin [facility]) powder Apply topically 2 (two) times daily. 09/28/18   Zara Council A, PA-C  polyethylene glycol powder (MIRALAX) powder Take 17 g by mouth daily as needed. Can increase to 3 times a day as needed for constipation but hold medication if has diarrhea 01/30/18   Sindy Guadeloupe, MD  zinc oxide 20 % ointment Apply 1 application topically as needed for irritation.    [provider]      VITAL SIGNS:  Blood pressure 130/68, pulse 94, temperature 98.6 F (37 C), temperature source Oral, resp. rate 18, height 5\' 11"  (1.803 m), weight 79.4 kg, SpO2 100 %.  PHYSICAL EXAMINATION:  Physical Exam  GENERAL:  84 y.o.-year-old elderly patient lying in the bed with no acute distress. Hard of hearing EYES: Pupils equal, round, reactive to light and accommodation. No scleral icterus. Extraocular muscles intact.  HEENT: Head atraumatic, normocephalic. Oropharynx and nasopharynx clear.  NECK:  Supple, no jugular venous distention. No thyroid enlargement, no tenderness.  LUNGS: Normal breath sounds bilaterally, no wheezing, rales,rhonchi or crepitation. No use of accessory muscles of respiration.  Decreased bibasilar breath sounds CARDIOVASCULAR: S1, S2 normal. No  rubs, or gallops.  2/6 systolic murmur present ABDOMEN: Soft, nontender, nondistended. Bowel sounds present. No organomegaly or mass.  EXTREMITIES: No  cyanosis, or clubbing.  Trace pedal edema noted NEUROLOGIC: Cranial nerves II through XII are intact. Muscle strength 5/5 in all extremities. Sensation intact. Gait not checked. Global weakness noted PSYCHIATRIC: The patient is alert and oriented x 2. Some confusion noted. SKIN: No obvious rash, lesion, or ulcer.   LABORATORY PANEL:   CBC Recent Labs  Lab 04/08/19 0455  WBC 30.6*  HGB  8.6*  HCT 28.0*  PLT 504*   ------------------------------------------------------------------------------------------------------------------  Chemistries  Recent Labs  Lab 04/08/19 0455  NA 135  K 4.1  CL 105  CO2 21*  GLUCOSE 136*  BUN 28*  CREATININE 1.70*  CALCIUM 8.4*  AST 13*  ALT 14  ALKPHOS 80  BILITOT 0.3   ------------------------------------------------------------------------------------------------------------------  Cardiac Enzymes No results for input(s): TROPONINI in the last 168 hours. ------------------------------------------------------------------------------------------------------------------  RADIOLOGY:  Ct Renal Stone Study  Result Date: 04/08/2019 CLINICAL DATA:  84 year old male with lack of nephrostomy tube drainage for 24-36 hours with flank pain. Bladder carcinoma. EXAM: CT ABDOMEN AND PELVIS WITHOUT CONTRAST TECHNIQUE: Multidetector CT imaging of the abdomen and pelvis was performed following the standard protocol without IV contrast. COMPARISON:  CT Abdomen and Pelvis 03/22/2019 and earlier. FINDINGS: Lower chest: Small volume pericardial effusion is new since last month with simple fluid density. Small layering right pleural effusion  is also new. No cardiomegaly. No new lung base opacity. Hepatobiliary: Surgically absent gallbladder. Negative noncontrast liver. Pancreas: Stable, atrophied. Spleen: Negative. Adrenals/Urinary Tract: Stable adrenal glands. Bilateral nephrostomy tubes. The left kidney is decompressed now compared to 03/22/2019. There is new right hydronephrosis. Mild right pararenal stranding is stable. No discontinuity or kinking of the right nephrostomy tube is identified. New right hydroureter. Partially necrotic urinary bladder tumor redemonstrated and not significantly changed. There are distal small bowel loops inseparable from the abnormal urinary bladder. Stomach/Bowel: Increased retained stool throughout the large bowel with 6  centimeter stool ball in the rectum. Redundant sigmoid. Redundant transverse colon. No large bowel wall thickening. Appendix remains within normal limits. No dilated small bowel. Decompressed stomach. No free air. No free fluid identified. Vascular/Lymphatic: Vascular patency is not evaluated in the absence of IV contrast. Extensive Aortoiliac calcified atherosclerosis. Reproductive: Stable. Other: No pelvic free fluid. Musculoskeletal: Stable visualized osseous structures. IMPRESSION: 1. New right hydronephrosis and hydroureter since 03/22/19, but right nephrostomy tube remains in place with no adverse features. The tube may be clogged. 2. New small pericardial and layering right pleural effusions. 3. The left kidney is decompressed since the prior with left nephrostomy tube in place. 4. Partially necrotic urinary bladder tumor not significantly changed. Note that this is inseparable from some small bowel loops. 5. Increased retained stool throughout the large bowel with stool ball in the rectum suspicious for fecal impaction. Electronically Signed   By: Genevie Ann M.D.   On: 04/08/2019 05:56      IMPRESSION AND PLAN:   Vincent Poole  is a 84 y.o. male with a known history of locally invasive high-grade urothelial cancer status post bilateral nephrostomy tubes, history of right leg DVT in March 2020 on Eliquis, hypertension, GERD, arthritis and prior history of prostate cancer comes from home secondary to worsening abdominal and right flank pain.  1.  Right-sided hydronephrosis and hydroureter-secondary to possible clogging of right nephrostomy tube.  Both tubes were exchanged about 2 weeks ago. -Admit under observation, IR consulted for nephrostogram and exchange of right-sided nephrostomy tube as needed. -Renal function at baseline. -Most recent urine cultures growing yeast which were not treated.  Diflucan while here and IV Rocephin until urine cultures  2.  Locally advanced high-grade urothelial  cancer-follows with urologist and oncologist.  Finished chemo and radiation.  Currently on Tecentriq which has been held this month due to poor performance status. -Discussed with his oncologist, recommended poor prognosis and hospice. -Palliative care consulted  3.  Chronic leukocytosis-could be from underlying cancer.  In spite of treating and UTIs, his white count has remained elevated lately.  4.  History of right leg DVT in March 2020-continue Eliquis  5.  Chronic anemia-stable.  No indication for transfusion  6.  DVT prophylaxis-already on Eliquis  Physical therapy consult requested   All the records are reviewed and case discussed with ED provider. Management plans discussed with the patient, family and they are in agreement.  CODE STATUS: Full code  TOTAL TIME TAKING CARE OF THIS PATIENT: 51  minutes.    Gladstone Lighter M.D on 04/08/2019 at 10:54 AM  Between 7am to 6pm - Pager - [phone]  After 6pm go to [email or web address] - Proofreader  [facility]  Office  [phone]  CC: Primary care physician; [name], MD   Note: This dictation was prepared with Dragon dictation along with smaller phrase technology. Any transcriptional errors that result from this process are unintentional.

## 2019-04-08 NOTE — ED Notes (Signed)
Pt wife updated that pt getting ready to go to IR for urostomy tube change.  Report to IR RN.

## 2019-04-08 NOTE — ED Provider Notes (Signed)
[facility] Emergency Department Provider Note    First MD Initiated Contact with Patient 04/08/19 [phone]     (approximate)  I have reviewed the triage vital signs and the nursing notes.   HISTORY  Chief Complaint Back Pain    HPI Vincent Poole is a 84 y.o. male extensive and complex past medical history presents the ER for worsening right flank pain and right nephrostomy tube no longer draining.  The left one still is.  Denies any fevers or chills at home.  States the pain is mild to moderate.  Symptoms started roughly 12 to 24 hours ago.  Patient just had bilateral nephrostomy tubes replaced on the 18th.  Not had any complications since then.    Past Medical History:  Diagnosis Date   Anxiety    Arthritis    Bladder cancer ([place])    Dysrhythmia 07/2018   history of atrial flutter that worsens with anxiety   Femur fracture, right ([place]) 05/01/2018   GERD (gastroesophageal reflux disease)    History of recent blood transfusion 05/2018   Hypertension    Iron deficiency anemia 09/14/2018   Prostate cancer ([place]) 07/2018   cancer growing in prostate but not prostate cancer, it is from the bladder   Umbilical hernia 94/7654   Urinary retention 2019   foley catheter place 11/2017   UTI (urinary tract infection) 2019   frequent UTI's over last year   Wound eschar of foot 07/2018   left heal getting wrapped and requiring antibiotic cream. cracks open with weight bearing.   Family History  Problem Relation Age of Onset   Cancer Mother    Chronic Renal Failure Mother    Heart disease Father    Past Surgical History:  Procedure Laterality Date   CARPAL TUNNEL RELEASE Right    CHOLECYSTECTOMY  2004   CYSTOGRAM  07/18/2018   Procedure: CYSTOGRAM;  Surgeon: [name], MD;  Location: [facility] ORS;  Service: Urology;;   Consuela Mimes W/ RETROGRADES Bilateral 07/18/2018   Procedure: CYSTOSCOPY WITH RETROGRADE PYELOGRAM;  Surgeon: [name], MD;  Location: [facility];  Service: Urology;  Laterality: Bilateral;   CYSTOSCOPY W/ URETERAL STENT PLACEMENT Bilateral 12/27/2017   Procedure: CYSTOSCOPY WITH RETROGRADE PYELOGRAM/URETERAL STENT PLACEMENT;  Surgeon: [name], MD;  Location: [facility];  Service: Urology;  Laterality: Bilateral;   CYSTOSCOPY W/ URETERAL STENT PLACEMENT Bilateral 07/18/2018   Procedure: CYSTOSCOPY WITH STENT REPLACEMENT (exchange);  Surgeon: [name], MD;  Location: [facility];  Service: Urology;  Laterality: Bilateral;   CYSTOSCOPY WITH STENT PLACEMENT Bilateral 11/12/2018   Procedure: [place] WITH STENT Exchange;  Surgeon: [name], MD;  Location: [facility];  Service: Urology;  Laterality: Bilateral;   INTRAMEDULLARY (IM) NAIL INTERTROCHANTERIC Right 05/02/2018   Procedure: INTRAMEDULLARY (IM) NAIL INTERTROCHANTRIC;  Surgeon: [name], MD;  Location: [facility];  Service: Orthopedics;  Laterality: Right;   IR NEPHROSTOMY EXCHANGE LEFT  03/25/2019   IR NEPHROSTOMY EXCHANGE RIGHT  03/25/2019   IR NEPHROSTOMY PLACEMENT LEFT  02/23/2019   IR NEPHROSTOMY PLACEMENT RIGHT  02/23/2019   LEG TENDON SURGERY Right 1958   PORTA CATH INSERTION N/A 01/22/2018   Procedure: PORTA CATH INSERTION;  Surgeon: [name], MD;  Location: [facility];  Service: Cardiovascular;  Laterality: N/A;   TRANSURETHRAL RESECTION OF BLADDER TUMOR N/A 12/27/2017   Procedure: TRANSURETHRAL RESECTION OF BLADDER TUMOR (TURBT);  Surgeon: [name], MD;  Location: [facility];  Service: Urology;  Laterality: N/A;  TRANSURETHRAL RESECTION OF BLADDER TUMOR N/A 01/15/2018   Procedure: TRANSURETHRAL RESECTION OF BLADDER TUMOR (TURBT);  Surgeon: [name], MD;  Location: [facility];  Service: Urology;  Laterality: N/A;  Need 2 hrs for this case please   TRANSURETHRAL RESECTION OF BLADDER TUMOR N/A 07/18/2018   Procedure: TRANSURETHRAL RESECTION OF BLADDER TUMOR (TURBT);  Surgeon: [name], MD;   Location: [facility];  Service: Urology;  Laterality: N/A;   Patient Active Problem List   Diagnosis Date Noted   Pressure injury of skin 03/23/2019   SIRS (systemic inflammatory response syndrome) ([place]) 03/22/2019   Leukocytosis 05/02/9484   Complicated UTI (urinary tract infection) 11/14/2018   Palliative care encounter    Iron deficiency anemia 09/14/2018   Hip fracture ([place]) 05/01/2018   Malignant neoplasm of urinary bladder ([place]) 01/12/2018   Goals of care, counseling/discussion 01/12/2018   Pyelonephritis 05/31/2017   UTI (urinary tract infection) 03/04/2017   Urinary obstruction    Essential hypertension 08/30/2016   History of shingles 08/30/2016   Prostate cancer ([place]) 08/30/2016   Urinary retention 08/30/2016   Medicare annual wellness visit, initial 08/01/2016   Medicare annual wellness visit, subsequent 08/01/2016   Sepsis ([place]) 07/11/2016   Borderline diabetes mellitus 01/26/2016   Vaccine counseling 01/26/2015   Lumbar radiculitis 06/24/2014   OA (osteoarthritis) of hip 06/24/2014   Malignant neoplasm of prostate ([place]) 01/27/2011      Prior to Admission medications   Medication Sig Start Date End Date Taking? Authorizing Provider  acetaminophen (TYLENOL) 500 MG tablet Take 1,000 mg by mouth every 6 (six) hours as needed for moderate pain or headache.     [provider]  cetirizine (ZYRTEC) 10 MG tablet Take 10 mg by mouth daily as needed for allergies.     [provider]  Cranberry-Cholecalciferol (SUPER CRANBERRY/VITAMIN D3) 4200-500 MG-UNIT CAPS Take 1 capsule by mouth 2 (two) times daily.    [provider]  ELIQUIS 5 MG TABS tablet TAKE ONE TABLET BY MOUTH TWICE DAILY Patient taking differently: Take 5 mg by mouth 2 (two) times daily.  01/30/19   Cammie Sickle, MD  ferrous sulfate 325 (65 FE) MG tablet Take 1 tablet (325 mg total) by mouth 2 (two) times daily with a meal. 05/06/18   Gouru, Aruna, MD    lidocaine-prilocaine (EMLA) cream Apply 1 application topically as needed. 01/02/19   Sindy Guadeloupe, MD  Multiple Vitamin (MULTIVITAMIN WITH MINERALS) TABS tablet Take 1 tablet by mouth daily.    [provider]  MYRBETRIQ 50 MG TB24 tablet TAKE ONE TABLET EVERY DAY Patient taking differently: Take 50 mg by mouth daily.  01/02/19   Zara Council A, PA-C  nystatin [facility]) powder Apply topically 2 (two) times daily. 09/28/18   Zara Council A, PA-C  oxyCODONE (OXY IR/ROXICODONE) 5 MG immediate release tablet Take 1 tablet (5 mg total) by mouth every 4 (four) hours as needed for severe pain. 03/21/19   Sindy Guadeloupe, MD  polyethylene glycol powder (MIRALAX) powder Take 17 g by mouth daily as needed. Can increase to 3 times a day as needed for constipation but hold medication if has diarrhea 01/30/18   Sindy Guadeloupe, MD  potassium chloride SA (K-DUR) 20 MEQ tablet Take 20 mEq by mouth 2 (two) times daily.    [provider]  vitamin B-12 (CYANOCOBALAMIN) 500 MCG tablet Take 500 mcg by mouth daily.    [provider]  zinc oxide 20 % ointment Apply 1 application topically  as needed for irritation.    [provider]    Allergies Patient has no known allergies.    Social History Social History   Tobacco Use   Smoking status: Never Smoker   Smokeless tobacco: Never Used  Substance Use Topics   Alcohol use: No    Alcohol/week: 0.0 standard drinks   Drug use: No    Review of Systems Patient denies headaches, rhinorrhea, blurry vision, numbness, shortness of breath, chest pain, edema, cough, abdominal pain, nausea, vomiting, diarrhea, dysuria, fevers, rashes or hallucinations unless otherwise stated above in HPI. ____________________________________________   PHYSICAL EXAM:  VITAL SIGNS: Vitals:   04/08/19 0515 04/08/19 0545  BP:    Pulse: 100 95  Resp: 17 20  Temp:    SpO2: 97% 98%    Constitutional: Alert and oriented frail and  chronically ill appearing.  Very HOH Eyes: Conjunctivae are normal.  Head: Atraumatic. Nose: No congestion/rhinnorhea. Mouth/Throat: Mucous membranes are moist.   Neck: No stridor. Painless ROM.  Cardiovascular: Normal rate, regular rhythm. Grossly normal heart sounds.  Good peripheral circulation. Respiratory: Normal respiratory effort.  No retractions. Lungs CTAB. Gastrointestinal: Soft and nontender. No distention. No abdominal bruits. Genitourinary: deferred Musculoskeletal: No lower extremity tenderness nor edema.  No joint effusions. Neurologic:  Normal speech and language. No gross focal neurologic deficits are appreciated. No facial droop Skin:  Skin is warm, dry and intact. No rash noted. Psychiatric: Mood and affect are normal. Speech and behavior are normal.  ____________________________________________   LABS (all labs ordered are listed, but only abnormal results are displayed)  Results for orders placed or performed during the hospital encounter of 04/08/19 (from the past 24 hour(s))  Urinalysis, Complete w Microscopic     Status: Abnormal   Collection Time: 04/08/19  4:55 AM  Result Value Ref Range   Color, Urine YELLOW (A) YELLOW   APPearance CLOUDY (A) CLEAR   Specific Gravity, Urine 1.017 1.005 - 1.030   pH 5.0 5.0 - 8.0   Glucose, UA NEGATIVE NEGATIVE mg/dL   Hgb urine dipstick MODERATE (A) NEGATIVE   Bilirubin Urine NEGATIVE NEGATIVE   Ketones, ur NEGATIVE NEGATIVE mg/dL   Protein, ur 100 (A) NEGATIVE mg/dL   Nitrite NEGATIVE NEGATIVE   Leukocytes,Ua LARGE (A) NEGATIVE   RBC / HPF >50 (H) 0 - 5 RBC/hpf   WBC, UA >50 (H) 0 - 5 WBC/hpf   Bacteria, UA FEW (A) NONE SEEN   Squamous Epithelial / LPF 0-5 0 - 5   WBC Clumps PRESENT    Mucus PRESENT    Budding Yeast PRESENT   CBC with Differential/Platelet     Status: Abnormal   Collection Time: 04/08/19  4:55 AM  Result Value Ref Range   WBC 30.6 (H) 4.0 - 10.5 K/uL   RBC 2.90 (L) 4.22 - 5.81 MIL/uL    Hemoglobin 8.6 (L) 13.0 - 17.0 g/dL   HCT 28.0 (L) 39.0 - 52.0 %   MCV 96.6 80.0 - 100.0 fL   MCH 29.7 26.0 - 34.0 pg   MCHC 30.7 30.0 - 36.0 g/dL   RDW 15.6 (H) 11.5 - 15.5 %   Platelets 504 (H) 150 - 400 K/uL   nRBC 0.0 0.0 - 0.2 %   Neutrophils Relative % 84 %   Neutro Abs 26.1 (H) 1.7 - 7.7 K/uL   Lymphocytes Relative 7 %   Lymphs Abs 2.1 0.7 - 4.0 K/uL   Monocytes Relative 5 %   Monocytes Absolute 1.4 (H) 0.1 -  1.0 K/uL   Eosinophils Relative 1 %   Eosinophils Absolute 0.2 0.0 - 0.5 K/uL   Basophils Relative 1 %   Basophils Absolute 0.2 (H) 0.0 - 0.1 K/uL   WBC Morphology MORPHOLOGY UNREMARKABLE    RBC Morphology MORPHOLOGY UNREMARKABLE    Smear Review Normal platelet morphology    Immature Granulocytes 2 %   Abs Immature Granulocytes 0.65 (H) 0.00 - 0.07 K/uL  Comprehensive metabolic panel     Status: Abnormal   Collection Time: 04/08/19  4:55 AM  Result Value Ref Range   Sodium 135 135 - 145 mmol/L   Potassium 4.1 3.5 - 5.1 mmol/L   Chloride 105 98 - 111 mmol/L   CO2 21 (L) 22 - 32 mmol/L   Glucose, Bld 136 (H) 70 - 99 mg/dL   BUN 28 (H) 8 - 23 mg/dL   Creatinine, Ser 1.70 (H) 0.61 - 1.24 mg/dL   Calcium 8.4 (L) 8.9 - 10.3 mg/dL   Total Protein 7.0 6.5 - 8.1 g/dL   Albumin 2.5 (L) 3.5 - 5.0 g/dL   AST 13 (L) 15 - 41 U/L   ALT 14 0 - 44 U/L   Alkaline Phosphatase 80 38 - 126 U/L   Total Bilirubin 0.3 0.3 - 1.2 mg/dL   GFR calc non Af Amer 37 (L) >60 mL/min   GFR calc Af Amer 43 (L) >60 mL/min   Anion gap 9 5 - 15   ____________________________________________ ____________________________________________  RADIOLOGY  I personally reviewed all radiographic images ordered to evaluate for the above acute complaints and reviewed radiology reports and findings.  These findings were personally discussed with the patient.  Please see medical record for radiology report.  ____________________________________________   PROCEDURES  Procedure(s) performed:    Procedures    Critical Care performed: no ____________________________________________   INITIAL IMPRESSION / ASSESSMENT AND PLAN / ED COURSE  Pertinent labs & imaging results that were available during my care of the patient were reviewed by me and considered in my medical decision making (see chart for details).   DDX: catheter displacement, clogged catheter, abscess, stone, hydro  Vincent Poole is a 84 y.o. who presents to the ED with complaint of flank pain and decreased urine output from the right catheter.  Has had issues with this catheter in the past status post recent exchange.  Denies any fevers and he is afebrile and hemodynamically stable on arrival.  Blood work will be sent for the by differential.  Will order CT imaging to evaluate stent placement or any other post exchange complications.  Clinical Course as of Apr 08 647  Mon Apr 08, 2019  0612 Blood work is roughly at baseline.  Urinalysis has few bacteria.  CT imaging does show evidence of new hydro-nephrosis on the right suggesting clogged nephrostomy tube.  Will flush as well as consult with urology regarding possible need for exchange.   [PR]  [phone] Was able to flush the catheter with it returned 2030 cc of urine but certainly does not appear to be flowing as well as the left.  Discussed case in consultation with Dr. Bernardo Heater of urology.  I do believe it would be in his best interest to be admitted to the hospital for nephrostomy exchange and discussed with IR possibility of upsizing catheter given worsening hydro.  Have discussed with the patient and available family all diagnostics and treatments performed thus far and all questions were answered to the best of my ability. The patient demonstrates understanding  and agreement with plan.    [PR]    Clinical Course User Index [PR] Merlyn Lot, MD    The patient was evaluated in Emergency Department today for the symptoms described in the history of present  illness. He/she was evaluated in the context of the global COVID-19 pandemic, which necessitated consideration that the patient might be at risk for infection with the SARS-CoV-2 virus that causes COVID-19. Institutional protocols and algorithms that pertain to the evaluation of patients at risk for COVID-19 are in a state of rapid change based on information released by regulatory bodies including the CDC and federal and state organizations. These policies and algorithms were followed during the patient's care in the ED.  As part of my medical decision making, I reviewed the following data within the [facility] notes reviewed and incorporated, Labs reviewed, notes from prior ED visits and [place] Controlled Substance Database   ____________________________________________   FINAL CLINICAL IMPRESSION(S) / ED DIAGNOSES  Final diagnoses:  Hydronephrosis  Acute right flank pain  Obstructed nephrostomy tube ([place])      NEW MEDICATIONS STARTED DURING THIS VISIT:  New Prescriptions   No medications on file     Note:  This document was prepared using Dragon voice recognition software and may include unintentional dictation errors.    Merlyn Lot, MD 04/08/19 [phone]

## 2019-04-08 NOTE — ED Notes (Signed)
Patient reports that his right nephrostomy has not been draining for between 24-36 hours. Patient c/o intermittent right flank pain, denies current pain.

## 2019-04-08 NOTE — ED Notes (Signed)
Patient returned from IR

## 2019-04-08 NOTE — ED Notes (Signed)
ED Provider at bedside. 

## 2019-04-08 NOTE — ED Notes (Signed)
Selinda Eon RN flushed right nephrostomy tube per MD order. 20 mL sterile saline instilled, 50 mL fluid returned.

## 2019-04-08 NOTE — ED Notes (Signed)
ED TO INPATIENT HANDOFF REPORT  ED Nurse Name and Phone #: Vincent Poole [phone]  S Name/Age/Gender Vincent Poole 84 y.o. male Room/Bed: ED26A/ED26A  Code Status   Code Status: Prior  Home/SNF/Other Home Patient oriented to: self, place, time and situation Is this baseline? Yes   Triage Complete: Triage complete  Chief Complaint fluid is not draining out tube.  Triage Note Pt arrived POV with c/o right mid back pain; history of prostate and bladder cancer; pt has nephrostomy tubes on both sides of mid back; pt says the left tube is draining but the right side is not; this is causing him pain; pt was admitted to [facility] 5/15 and tubes were changed on 5/18; pt has also been admitted before for sepsis; pt is very HOH   Allergies No Known Allergies  Level of Care/Admitting Diagnosis ED Disposition    ED Disposition Condition [place]: [place] [[id number]]  Level of Care: Med-Surg [16]  Covid Evaluation: N/A  Diagnosis: Hydronephrosis, right [[id number]]  Admitting Physician: Gladstone Lighter [[id number]]  Attending Physician: Gladstone Lighter [[id number]]  Estimated length of stay: past midnight tomorrow  Certification:: I certify this patient will need inpatient services for at least 2 midnights  Bed request comments: 1c  PT Class (Do Not Modify): Inpatient [101]  PT Acc Code (Do Not Modify): Private [1]       B Medical/Surgery History Past Medical History:  Diagnosis Date  . Anxiety   . Arthritis   . Bladder cancer ([place])   . Dysrhythmia 07/2018   history of atrial flutter that worsens with anxiety  . Femur fracture, right ([place]) 05/01/2018  . GERD (gastroesophageal reflux disease)   . History of recent blood transfusion 05/2018  . Hypertension   . Iron deficiency anemia 09/14/2018  . Prostate cancer ([place]) 07/2018   cancer growing in prostate but not prostate cancer, it is from the bladder  . Umbilical hernia 49/7026  . Urinary  retention 2019   foley catheter place 11/2017  . UTI (urinary tract infection) 2019   frequent UTI's over last year  . Wound eschar of foot 07/2018   left heal getting wrapped and requiring antibiotic cream. cracks open with weight bearing.   Past Surgical History:  Procedure Laterality Date  . CARPAL TUNNEL RELEASE Right   . CHOLECYSTECTOMY  2004  . CYSTOGRAM  07/18/2018   Procedure: CYSTOGRAM;  Surgeon: [name], MD;  Location: [facility];  Service: Urology;;  . Consuela Mimes W/ RETROGRADES Bilateral 07/18/2018   Procedure: CYSTOSCOPY WITH RETROGRADE PYELOGRAM;  Surgeon: [name], MD;  Location: [facility];  Service: Urology;  Laterality: Bilateral;  . CYSTOSCOPY W/ URETERAL STENT PLACEMENT Bilateral 12/27/2017   Procedure: CYSTOSCOPY WITH RETROGRADE PYELOGRAM/URETERAL STENT PLACEMENT;  Surgeon: [name], MD;  Location: [facility];  Service: Urology;  Laterality: Bilateral;  . CYSTOSCOPY W/ URETERAL STENT PLACEMENT Bilateral 07/18/2018   Procedure: CYSTOSCOPY WITH STENT REPLACEMENT (exchange);  Surgeon: [name], MD;  Location: [facility];  Service: Urology;  Laterality: Bilateral;  . CYSTOSCOPY WITH STENT PLACEMENT Bilateral 11/12/2018   Procedure: [place] WITH STENT Exchange;  Surgeon: [name], MD;  Location: [facility];  Service: Urology;  Laterality: Bilateral;  . INTRAMEDULLARY (IM) NAIL INTERTROCHANTERIC Right 05/02/2018   Procedure: INTRAMEDULLARY (IM) NAIL INTERTROCHANTRIC;  Surgeon: [name], MD;  Location: [facility];  Service: Orthopedics;  Laterality: Right;  . IR NEPHROSTOMY EXCHANGE LEFT  03/25/2019  . IR NEPHROSTOMY EXCHANGE RIGHT  03/25/2019  .  IR NEPHROSTOMY PLACEMENT LEFT  02/23/2019  . IR NEPHROSTOMY PLACEMENT RIGHT  02/23/2019  . LEG TENDON SURGERY Right 1958  . PORTA CATH INSERTION N/A 01/22/2018   Procedure: PORTA CATH INSERTION;  Surgeon: [name], MD;  Location: [facility];  Service: Cardiovascular;  Laterality: N/A;  .  TRANSURETHRAL RESECTION OF BLADDER TUMOR N/A 12/27/2017   Procedure: TRANSURETHRAL RESECTION OF BLADDER TUMOR (TURBT);  Surgeon: [name], MD;  Location: [facility];  Service: Urology;  Laterality: N/A;  . TRANSURETHRAL RESECTION OF BLADDER TUMOR N/A 01/15/2018   Procedure: TRANSURETHRAL RESECTION OF BLADDER TUMOR (TURBT);  Surgeon: [name], MD;  Location: [facility];  Service: Urology;  Laterality: N/A;  Need 2 hrs for this case please  . TRANSURETHRAL RESECTION OF BLADDER TUMOR N/A 07/18/2018   Procedure: TRANSURETHRAL RESECTION OF BLADDER TUMOR (TURBT);  Surgeon: [name], MD;  Location: [facility];  Service: Urology;  Laterality: N/A;     A IV Location/Drains/Wounds Patient Lines/Drains/Airways Status   Active Line/Drains/Airways    Name:   Placement date:   Placement time:   Site:   Days:   Implanted Port 03/21/19   03/21/19    0916    -   18   Peripheral IV 04/08/19 Left Antecubital   04/08/19    0455    Antecubital   less than 1   Nephrostomy Left 10.2 Fr.   03/25/19    1421    Left   14   Nephrostomy Right 10.2 Fr.   03/25/19    1422    Right   14   Pressure Injury 03/22/19 Stage I -  Intact skin with non-blanchable redness of a localized area usually over a bony prominence. red but skin intact   03/22/19    2339     17          Intake/Output Last 24 hours No intake or output data in the 24 hours ending 04/08/19 0813  Labs/Imaging Results for orders placed or performed during the hospital encounter of 04/08/19 (from the past 48 hour(s))  Urinalysis, Complete w Microscopic     Status: Abnormal   Collection Time: 04/08/19  4:55 AM  Result Value Ref Range   Color, Urine YELLOW (A) YELLOW   APPearance CLOUDY (A) CLEAR   Specific Gravity, Urine 1.017 1.005 - 1.030   pH 5.0 5.0 - 8.0   Glucose, UA NEGATIVE NEGATIVE mg/dL   Hgb urine dipstick MODERATE (A) NEGATIVE   Bilirubin Urine NEGATIVE NEGATIVE   Ketones, ur NEGATIVE NEGATIVE mg/dL   Protein, ur 100 (A)  NEGATIVE mg/dL   Nitrite NEGATIVE NEGATIVE   Leukocytes,Ua LARGE (A) NEGATIVE   RBC / HPF >50 (H) 0 - 5 RBC/hpf   WBC, UA >50 (H) 0 - 5 WBC/hpf   Bacteria, UA FEW (A) NONE SEEN   Squamous Epithelial / LPF 0-5 0 - 5   WBC Clumps PRESENT    Mucus PRESENT    Budding Yeast PRESENT     Comment: Performed at [facility], [address] [place], [place] [address]  CBC with Differential/Platelet     Status: Abnormal   Collection Time: 04/08/19  4:55 AM  Result Value Ref Range   WBC 30.6 (H) 4.0 - 10.5 K/uL   RBC 2.90 (L) 4.22 - 5.81 MIL/uL   Hemoglobin 8.6 (L) 13.0 - 17.0 g/dL   HCT 28.0 (L) 39.0 - 52.0 %   MCV 96.6 80.0 - 100.0 fL   MCH  29.7 26.0 - 34.0 pg   MCHC 30.7 30.0 - 36.0 g/dL   RDW 15.6 (H) 11.5 - 15.5 %   Platelets 504 (H) 150 - 400 K/uL   nRBC 0.0 0.0 - 0.2 %   Neutrophils Relative % 84 %   Neutro Abs 26.1 (H) 1.7 - 7.7 K/uL   Lymphocytes Relative 7 %   Lymphs Abs 2.1 0.7 - 4.0 K/uL   Monocytes Relative 5 %   Monocytes Absolute 1.4 (H) 0.1 - 1.0 K/uL   Eosinophils Relative 1 %   Eosinophils Absolute 0.2 0.0 - 0.5 K/uL   Basophils Relative 1 %   Basophils Absolute 0.2 (H) 0.0 - 0.1 K/uL   WBC Morphology MORPHOLOGY UNREMARKABLE    RBC Morphology MORPHOLOGY UNREMARKABLE    Smear Review Normal platelet morphology    Immature Granulocytes 2 %   Abs Immature Granulocytes 0.65 (H) 0.00 - 0.07 K/uL    Comment: Performed at [facility], [address] [place], [place] [address]  Comprehensive metabolic panel     Status: Abnormal   Collection Time: 04/08/19  4:55 AM  Result Value Ref Range   Sodium 135 135 - 145 mmol/L   Potassium 4.1 3.5 - 5.1 mmol/L   Chloride 105 98 - 111 mmol/L   CO2 21 (L) 22 - 32 mmol/L   Glucose, Bld 136 (H) 70 - 99 mg/dL   BUN 28 (H) 8 - 23 mg/dL   Creatinine, Ser 1.70 (H) 0.61 - 1.24 mg/dL   Calcium 8.4 (L) 8.9 - 10.3 mg/dL   Total Protein 7.0 6.5 - 8.1 g/dL   Albumin 2.5 (L) 3.5 - 5.0 g/dL   AST 13 (L) 15 - 41 U/L    ALT 14 0 - 44 U/L   Alkaline Phosphatase 80 38 - 126 U/L   Total Bilirubin 0.3 0.3 - 1.2 mg/dL   GFR calc non Af Amer 37 (L) >60 mL/min   GFR calc Af Amer 43 (L) >60 mL/min   Anion gap 9 5 - 15    Comment: Performed at [facility], [address], [place], [place] [address]  SARS Coronavirus 2 (CEPHEID - Performed in [place] hospital lab), Hosp Order     Status: None   Collection Time: 04/08/19  7:11 AM  Result Value Ref Range   SARS Coronavirus 2 NEGATIVE NEGATIVE    Comment: (NOTE) If result is NEGATIVE SARS-CoV-2 target nucleic acids are NOT DETECTED. The SARS-CoV-2 RNA is generally detectable in upper and lower  respiratory specimens during the acute phase of infection. The lowest  concentration of SARS-CoV-2 viral copies this assay can detect is 250  copies / mL. A negative result does not preclude SARS-CoV-2 infection  and should not be used as the sole basis for treatment or other  patient management decisions.  A negative result may occur with  improper specimen collection / handling, submission of specimen other  than nasopharyngeal swab, presence of viral mutation(s) within the  areas targeted by this assay, and inadequate number of viral copies  (<250 copies / mL). A negative result must be combined with clinical  observations, patient history, and epidemiological information. If result is POSITIVE SARS-CoV-2 target nucleic acids are DETECTED. The SARS-CoV-2 RNA is generally detectable in upper and lower  respiratory specimens dur ing the acute phase of infection.  Positive  results are indicative of active infection with SARS-CoV-2.  Clinical  correlation with patient history and other diagnostic information is  necessary to  determine patient infection status.  Positive results do  not rule out bacterial infection or co-infection with other viruses. If result is PRESUMPTIVE POSTIVE SARS-CoV-2 nucleic acids MAY BE PRESENT.   A presumptive positive result  was obtained on the submitted specimen  and confirmed on repeat testing.  While 2019 novel coronavirus  (SARS-CoV-2) nucleic acids may be present in the submitted sample  additional confirmatory testing may be necessary for epidemiological  and / or clinical management purposes  to differentiate between  SARS-CoV-2 and other Sarbecovirus currently known to infect humans.  If clinically indicated additional testing with an alternate test  methodology [phone]) is advised. The SARS-CoV-2 RNA is generally  detectable in upper and lower respiratory sp ecimens during the acute  phase of infection. The expected result is Negative. Fact Sheet for Patients:  StrictlyIdeas.no Fact Sheet for Healthcare Providers: BankingDealers.co.za This test is not yet approved or cleared by the [place] FDA and has been authorized for detection and/or diagnosis of SARS-CoV-2 by FDA under an Emergency Use Authorization (EUA).  This EUA will remain in effect (meaning this test can be used) for the duration of the COVID-19 declaration under Section 564(b)(1) of the Act, 21 U.S.C. section 360bbb-3(b)(1), unless the authorization is terminated or revoked sooner. Performed at [facility], [address] [place], [place] [address]    Ct Renal Laren Everts  Result Date: 04/08/2019 CLINICAL DATA:  84 year old male with lack of nephrostomy tube drainage for 24-36 hours with flank pain. Bladder carcinoma. EXAM: CT ABDOMEN AND PELVIS WITHOUT CONTRAST TECHNIQUE: Multidetector CT imaging of the abdomen and pelvis was performed following the standard protocol without IV contrast. COMPARISON:  CT Abdomen and Pelvis 03/22/2019 and earlier. FINDINGS: Lower chest: Small volume pericardial effusion is new since last month with simple fluid density. Small layering right pleural effusion is also new. No cardiomegaly. No new lung base opacity. Hepatobiliary: Surgically  absent gallbladder. Negative noncontrast liver. Pancreas: Stable, atrophied. Spleen: Negative. Adrenals/Urinary Tract: Stable adrenal glands. Bilateral nephrostomy tubes. The left kidney is decompressed now compared to 03/22/2019. There is new right hydronephrosis. Mild right pararenal stranding is stable. No discontinuity or kinking of the right nephrostomy tube is identified. New right hydroureter. Partially necrotic urinary bladder tumor redemonstrated and not significantly changed. There are distal small bowel loops inseparable from the abnormal urinary bladder. Stomach/Bowel: Increased retained stool throughout the large bowel with 6 centimeter stool ball in the rectum. Redundant sigmoid. Redundant transverse colon. No large bowel wall thickening. Appendix remains within normal limits. No dilated small bowel. Decompressed stomach. No free air. No free fluid identified. Vascular/Lymphatic: Vascular patency is not evaluated in the absence of IV contrast. Extensive Aortoiliac calcified atherosclerosis. Reproductive: Stable. Other: No pelvic free fluid. Musculoskeletal: Stable visualized osseous structures. IMPRESSION: 1. New right hydronephrosis and hydroureter since 03/22/19, but right nephrostomy tube remains in place with no adverse features. The tube may be clogged. 2. New small pericardial and layering right pleural effusions. 3. The left kidney is decompressed since the prior with left nephrostomy tube in place. 4. Partially necrotic urinary bladder tumor not significantly changed. Note that this is inseparable from some small bowel loops. 5. Increased retained stool throughout the large bowel with stool ball in the rectum suspicious for fecal impaction. Electronically Signed   By: Genevie Ann M.D.   On: 04/08/2019 05:56    Pending Labs Unresulted Labs (From admission, onward)    Start     Ordered   04/08/19 0645  Urine Culture  Add-on,   AD     04/08/19 0511   Signed and Held  Basic metabolic panel   Tomorrow morning,   R     Signed and Held   Signed and Held  CBC  Tomorrow morning,   R     Signed and Held          Vitals/Pain Today's Vitals   04/08/19 0545 04/08/19 0600 04/08/19 0630 04/08/19 0730  BP:  120/62 105/62 130/63  Pulse: 95 96 98 94  Resp: 20 (!) 21 20 (!) 21  Temp:      TempSrc:      SpO2: 98% 100% 100% 98%  Weight:      Height:      PainSc:        Isolation Precautions No active isolations  Medications Medications - No data to display  Mobility  High fall risk   Focused Assessments Renal Assessment Handoff:        R Recommendations: See Admitting Provider Note  Report given to:   Additional Notes:  Both nephrostomy tubes draining at this time.

## 2019-04-09 ENCOUNTER — Ambulatory Visit: Payer: Medicare Other | Admitting: Urology

## 2019-04-09 DIAGNOSIS — T83092A Other mechanical complication of nephrostomy catheter, initial encounter: Secondary | ICD-10-CM

## 2019-04-09 DIAGNOSIS — Z906 Acquired absence of other parts of urinary tract: Secondary | ICD-10-CM

## 2019-04-09 DIAGNOSIS — Z960 Presence of urogenital implants: Secondary | ICD-10-CM

## 2019-04-09 DIAGNOSIS — Z841 Family history of disorders of kidney and ureter: Secondary | ICD-10-CM

## 2019-04-09 DIAGNOSIS — D630 Anemia in neoplastic disease: Secondary | ICD-10-CM

## 2019-04-09 DIAGNOSIS — C679 Malignant neoplasm of bladder, unspecified: Secondary | ICD-10-CM

## 2019-04-09 DIAGNOSIS — N1330 Unspecified hydronephrosis: Secondary | ICD-10-CM

## 2019-04-09 DIAGNOSIS — D72828 Other elevated white blood cell count: Secondary | ICD-10-CM

## 2019-04-09 DIAGNOSIS — Y732 Prosthetic and other implants, materials and accessory gastroenterology and urology devices associated with adverse incidents: Secondary | ICD-10-CM

## 2019-04-09 DIAGNOSIS — Z79899 Other long term (current) drug therapy: Secondary | ICD-10-CM

## 2019-04-09 DIAGNOSIS — Z87440 Personal history of urinary (tract) infections: Secondary | ICD-10-CM

## 2019-04-09 DIAGNOSIS — Y833 Surgical operation with formation of external stoma as the cause of abnormal reaction of the patient, or of later complication, without mention of misadventure at the time of the procedure: Secondary | ICD-10-CM

## 2019-04-09 DIAGNOSIS — C799 Secondary malignant neoplasm of unspecified site: Secondary | ICD-10-CM | POA: Diagnosis not present

## 2019-04-09 LAB — BASIC METABOLIC PANEL
Anion gap: 7 (ref 5–15)
BUN: 25 mg/dL — ABNORMAL HIGH (ref 8–23)
CO2: 23 mmol/L (ref 22–32)
Calcium: 8.2 mg/dL — ABNORMAL LOW (ref 8.9–10.3)
Chloride: 106 mmol/L (ref 98–111)
Creatinine, Ser: 1.26 mg/dL — ABNORMAL HIGH (ref 0.61–1.24)
GFR calc Af Amer: >60 mL/min (ref >60)
GFR calc non Af Amer: 53 mL/min — ABNORMAL LOW (ref >60)
Glucose, Bld: 90 mg/dL (ref 70–99)
Potassium: 3.6 mmol/L (ref 3.5–5.1)
Sodium: 136 mmol/L (ref 135–145)

## 2019-04-09 LAB — CBC
HCT: 24.2 % — ABNORMAL LOW (ref 39.0–52.0)
Hemoglobin: 7.6 g/dL — ABNORMAL LOW (ref 13.0–17.0)
MCH: 30.3 pg (ref 26.0–34.0)
MCHC: 31.4 g/dL (ref 30.0–36.0)
MCV: 96.4 fL (ref 80.0–100.0)
Platelets: 432 10*3/uL — ABNORMAL HIGH (ref 150–400)
RBC: 2.51 MIL/uL — ABNORMAL LOW (ref 4.22–5.81)
RDW: 15.8 % — ABNORMAL HIGH (ref 11.5–15.5)
WBC: 24.7 10*3/uL — ABNORMAL HIGH (ref 4.0–10.5)
nRBC: 0 % (ref 0.0–0.2)

## 2019-04-09 LAB — URINE CULTURE

## 2019-04-09 MED ORDER — CEPHALEXIN 500 MG PO CAPS: 500.0000 mg | ORAL_CAPSULE | Freq: Three times a day (TID) | ORAL | 0 refills | Status: AC

## 2019-04-09 MED ORDER — FLUCONAZOLE 200 MG PO TABS: 200.0000 mg | ORAL_TABLET | Freq: Every day | ORAL | 0 refills | Status: AC

## 2019-04-09 MED ORDER — FLUCONAZOLE 100 MG PO TABS
200.0000 mg | ORAL_TABLET | Freq: Every day | ORAL | Status: DC
  Administered 2019-04-09 – 2019-04-10 (×2): 200 mg via ORAL
  Filled 2019-04-09 (×2): qty 2

## 2019-04-09 NOTE — Evaluation (Signed)
Physical Therapy Evaluation Patient Details Name: Vincent Poole MRN: [id number] DOB: [birth year] Today's Date: 04/09/2019   History of Present Illness  84 y.o. male with a known history of bladder cancer with bilateral indwelling stents and chronic indwelling foley who was with multiple recent hosptializations.  He as been recently treated for enterococcus fecalis in the urine by his oncologist first with Augmentin and then levaquin. Relevant PMH includes DVT diagnosed 01/04/2019,  GERD, anxiety, arthrits, history of atrial flutter, R femur fracture, HTN, prostate cancer, umbilical hernia, wound eschar L of foot, urinary retention with urinary catheter, multiple bladder surgeries/procedures. Recently discharged home and now back with hydronephrosis.  Clinical Impression  Pt very hard of hearing and anxious, but was willing and able to participate with PT exam and overall showed good effort despite needing extra time and encouragement t/o the PT exam.  He was able to do some minimal mobility/transfers bed to recliner but started having a bowel movement and needed to be assisted back to bed for clean up.  Pt very weak, stooped and generally quite weak and functionally limited but apparently is adamant about not going to a rehab facility (which is what this PT recommends), he does have 24/7 assist at home if rehab is refused and family can manage him.     Follow Up Recommendations SNF(pt refusing rehab (and HHPT?) needs 24/7 assist regardless)    Equipment Recommendations  None recommended by PT    Recommendations for Other Services       Precautions / Restrictions Precautions Precautions: Fall Restrictions Weight Bearing Restrictions: No      Mobility  Bed Mobility Overal bed mobility: Needs Assistance Bed Mobility: Supine to Sit;Sit to Supine     Supine to sit: Mod assist Sit to supine: Min assist      Transfers Overall transfer level: Needs assistance Equipment used: Rolling  walker (2 wheeled) Transfers: Sit to/from Stand Sit to Stand: Mod assist         General transfer comment: Pt needed heavy assist on 2 attempts at standing.  On second we were able to take a few side stepping/shuffling steps to turn 90 deg to recliner, ultimately he started to have a bowel movement and (with increased assist) we got back to bed.  Nursing notified about need for clean up.  Ambulation/Gait             General Gait Details: Pt with shuffling/turning transfer 90 deg then back to bed with constant mod assist, stooped posture and poor contidence, no real ambulation this date.  Stairs            Wheelchair Mobility    Modified Rankin (Stroke Patients Only)       Balance Overall balance assessment: Needs assistance Sitting-balance support: Bilateral upper extremity supported Sitting balance-Leahy Scale: Fair Sitting balance - Comments: Pt was able to maintain sitting, but anxious and needing encouragement     Standing balance-Leahy Scale: Poor Standing balance comment: stooped, highly reliant on walker, fearful, constant phyiscal assist in standing                             Pertinent Vitals/Pain      Home Living Family/patient expects to be discharged to:: Private residence Living Arrangements: Spouse/significant other;Children Available Help at Discharge: Family;Available 24 hours/day Type of Home: Mobile home Home Access: Stairs to enter Entrance Stairs-Rails: Left Entrance Stairs-Number of Steps: 4-5 Home Layout: One level Home Equipment:  Walker - 2 wheels      Prior Function Level of Independence: Needs assistance   Gait / Transfers Assistance Needed: transfers with assist from family to RW. Ambulates with assistance with RW.   ADL's / Homemaking Assistance Needed: patient requires help with dressing, bathing at times. Requires help with IADLs.   Comments: Pt.'s wife, son (home "24/7"), and daughter in law assists with meal  preparation, medication management, and transportation. Patient sleeps in recliner.      Hand Dominance   Dominant Hand: Right    Extremity/Trunk Assessment   Upper Extremity Assessment Upper Extremity Assessment: Generalized weakness    Lower Extremity Assessment Lower Extremity Assessment: Generalized weakness       Communication   Communication: HOH  Cognition Arousal/Alertness: Awake/alert Behavior During Therapy: WFL for tasks assessed/performed;Anxious Overall Cognitive Status: Within Functional Limits for tasks assessed                                        General Comments      Exercises     Assessment/Plan    PT Assessment Patient needs continued PT services  PT Problem List Decreased activity tolerance;Decreased strength;Decreased balance;Decreased mobility;Decreased cognition;Decreased knowledge of use of DME;Decreased safety awareness       PT Treatment Interventions DME instruction;Gait training;Stair training;Functional mobility training;Therapeutic activities;Balance training;Therapeutic exercise;Neuromuscular re-education;Patient/family education    PT Goals (Current goals can be found in the Care Plan section)  Acute Rehab PT Goals Patient Stated Goal: pt does not want to go to rehab if possible PT Goal Formulation: With patient Time For Goal Achievement: 04/23/19 Potential to Achieve Goals: Fair    Frequency Min 2X/week   Barriers to discharge        Co-evaluation               AM-PAC PT "6 Clicks" Mobility  Outcome Measure Help needed turning from your back to your side while in a flat bed without using bedrails?: A Little Help needed moving from lying on your back to sitting on the side of a flat bed without using bedrails?: A Lot Help needed moving to and from a bed to a chair (including a wheelchair)?: A Lot Help needed standing up from a chair using your arms (e.g., wheelchair or bedside chair)?: A Lot Help  needed to walk in hospital room?: A Lot Help needed climbing 3-5 steps with a railing? : Total 6 Click Score: 12    End of Session Equipment Utilized During Treatment: Gait belt Activity Tolerance: Other (comment)(fearful, having bowel movement during mobility) Patient left: with call bell/phone within reach;with bed alarm set Nurse Communication: Mobility status PT Visit Diagnosis: Muscle weakness (generalized) (M62.81);Difficulty in walking, not elsewhere classified (R26.2)    Time: 7092-9574 PT Time Calculation (min) (ACUTE ONLY): 26 min   Charges:   PT Evaluation $PT Eval Low Complexity: 1 Low PT Treatments $Therapeutic Activity: 8-22 mins        Kreg Shropshire, DPT 04/09/2019, 1:17 PM

## 2019-04-09 NOTE — Care Management CC44 (Signed)
Condition Code 44 Documentation Completed  Patient Details  Name: CARLETON VANVALKENBURGH MRN: [id number] Date of Birth: 14-Jun-1935   Condition Code 44 given:  Yes Patient signature on Condition Code 44 notice:  Yes Documentation of 2 MD's agreement:  Yes Code 44 added to claim:  Yes    Marshell Garfinkel, RN 04/09/2019, 8:30 AM

## 2019-04-09 NOTE — Progress Notes (Signed)
[facility] [place] NAME: Vincent Poole    MR#:  [id number]  DATE OF BIRTH:  04-20-1935  SUBJECTIVE:  CHIEF COMPLAINT:   Chief Complaint  Patient presents with   Back Pain    REVIEW OF SYSTEMS:  Review of Systems  Constitutional: Positive for malaise/fatigue. Negative for chills and fever.  HENT: Negative for congestion, ear discharge, hearing loss and nosebleeds.   Eyes: Negative for blurred vision and double vision.  Respiratory: Negative for cough, shortness of breath and wheezing.   Cardiovascular: Negative for chest pain and palpitations.  Gastrointestinal: Negative for abdominal pain, constipation, diarrhea, nausea and vomiting.  Genitourinary: Negative for dysuria.  Musculoskeletal: Negative for myalgias.  Neurological: Negative for dizziness, focal weakness, seizures, weakness and headaches.  Psychiatric/Behavioral: Negative for depression.    DRUG ALLERGIES:  No Known Allergies  VITALS:  Blood pressure (!) 114/52, pulse 94, temperature 98.6 F (37 C), temperature source Oral, resp. rate 16, height 5\' 11"  (1.803 m), weight 79.4 kg, SpO2 99 %.  PHYSICAL EXAMINATION:  Physical Exam  GENERAL:  84 y.o.-year-old elderly patient lying in the bed with no acute distress. Hard of hearing EYES: Pupils equal, round, reactive to light and accommodation. No scleral icterus. Extraocular muscles intact.  HEENT: Head atraumatic, normocephalic. Oropharynx and nasopharynx clear.  NECK:  Supple, no jugular venous distention. No thyroid enlargement, no tenderness.  LUNGS: Normal breath sounds bilaterally, no wheezing, rales,rhonchi or crepitation. No use of accessory muscles of respiration.  Decreased bibasilar breath sounds CARDIOVASCULAR: S1, S2 normal. No  rubs, or gallops.  2/6 systolic murmur present ABDOMEN: Soft, nontender, nondistended. Bowel sounds present. No organomegaly or mass.  EXTREMITIES: No  cyanosis, or clubbing.   Trace pedal edema noted NEUROLOGIC: Cranial nerves II through XII are intact. Muscle strength 5/5 in all extremities. Sensation intact. Gait not checked. Global weakness noted PSYCHIATRIC: The patient is alert and oriented x 2. Some confusion noted. SKIN: No obvious rash, lesion, or ulcer.    LABORATORY PANEL:   CBC Recent Labs  Lab 04/09/19 0413  WBC 24.7*  HGB 7.6*  HCT 24.2*  PLT 432*   ------------------------------------------------------------------------------------------------------------------  Chemistries  Recent Labs  Lab 04/08/19 0455 04/09/19 0413  NA 135 136  K 4.1 3.6  CL 105 106  CO2 21* 23  GLUCOSE 136* 90  BUN 28* 25*  CREATININE 1.70* 1.26*  CALCIUM 8.4* 8.2*  AST 13*  --   ALT 14  --   ALKPHOS 80  --   BILITOT 0.3  --    ------------------------------------------------------------------------------------------------------------------  Cardiac Enzymes No results for input(s): TROPONINI in the last 168 hours. ------------------------------------------------------------------------------------------------------------------  RADIOLOGY:  Ct Renal Stone Study  Result Date: 04/08/2019 CLINICAL DATA:  84 year old male with lack of nephrostomy tube drainage for 24-36 hours with flank pain. Bladder carcinoma. EXAM: CT ABDOMEN AND PELVIS WITHOUT CONTRAST TECHNIQUE: Multidetector CT imaging of the abdomen and pelvis was performed following the standard protocol without IV contrast. COMPARISON:  CT Abdomen and Pelvis 03/22/2019 and earlier. FINDINGS: Lower chest: Small volume pericardial effusion is new since last month with simple fluid density. Small layering right pleural effusion is also new. No cardiomegaly. No new lung base opacity. Hepatobiliary: Surgically absent gallbladder. Negative noncontrast liver. Pancreas: Stable, atrophied. Spleen: Negative. Adrenals/Urinary Tract: Stable adrenal glands. Bilateral nephrostomy tubes. The left kidney is decompressed  now compared to 03/22/2019. There is new right hydronephrosis. Mild right pararenal stranding is stable. No discontinuity or kinking of the right  nephrostomy tube is identified. New right hydroureter. Partially necrotic urinary bladder tumor redemonstrated and not significantly changed. There are distal small bowel loops inseparable from the abnormal urinary bladder. Stomach/Bowel: Increased retained stool throughout the large bowel with 6 centimeter stool ball in the rectum. Redundant sigmoid. Redundant transverse colon. No large bowel wall thickening. Appendix remains within normal limits. No dilated small bowel. Decompressed stomach. No free air. No free fluid identified. Vascular/Lymphatic: Vascular patency is not evaluated in the absence of IV contrast. Extensive Aortoiliac calcified atherosclerosis. Reproductive: Stable. Other: No pelvic free fluid. Musculoskeletal: Stable visualized osseous structures. IMPRESSION: 1. New right hydronephrosis and hydroureter since 03/22/19, but right nephrostomy tube remains in place with no adverse features. The tube may be clogged. 2. New small pericardial and layering right pleural effusions. 3. The left kidney is decompressed since the prior with left nephrostomy tube in place. 4. Partially necrotic urinary bladder tumor not significantly changed. Note that this is inseparable from some small bowel loops. 5. Increased retained stool throughout the large bowel with stool ball in the rectum suspicious for fecal impaction. Electronically Signed   By: Genevie Ann M.D.   On: 04/08/2019 05:56    EKG:   Orders placed or performed during the hospital encounter of 03/22/19   Repeat EKG   Repeat EKG    ASSESSMENT AND PLAN:   Vincent Poole  is a 84 y.o. male with a known history of locally invasive high-grade urothelial cancer status post bilateral nephrostomy tubes, history of right leg DVT in March 2020 on Eliquis, hypertension, GERD, arthritis and prior history of  prostate cancer comes from home secondary to worsening abdominal and right flank pain.  1.  Right-sided hydronephrosis and hydroureter-secondary to possible clogging of right nephrostomy tube.  Both tubes were exchanged about 2 weeks ago. -Admitted under observation, status post IR doing nephrostogram and exchange of right-sided nephrostomy tube.  Tube seems to be draining well now. -Renal function at baseline. -Most recent urine cultures growing yeast as outpatient.  Repeat culture showing mixed organisms.  On IV Rocephin and Diflucan for now.  2.  Locally advanced high-grade urothelial cancer-follows with urologist and oncologist.  Finished chemo and radiation.  Currently on Tecentriq which has been held this month due to poor performance status. -Discussed with his oncologist, recommended poor prognosis and hospice. -Palliative care consulted  3.  Chronic leukocytosis-could be from underlying cancer.  In spite of treating and UTIs, his white count has remained elevated -slightly improved now.  4.  History of right leg DVT in March 2020-continue Eliquis  5.  Chronic anemia-stable.  No indication for transfusion  6.  DVT prophylaxis-already on Eliquis  Physical therapy consult requested-they have recommended rehab but patient refusing. -Plan to discuss with his wife about home with hospice services versus rehab     All the records are reviewed and case discussed with Care Management/Social Workerr. Management plans discussed with the patient, family and they are in agreement.  CODE STATUS: Full Code  TOTAL TIME TAKING CARE OF THIS PATIENT: 38 minutes.   POSSIBLE D/C IN  DAYS, DEPENDING ON CLINICAL CONDITION.   Gladstone Lighter M.D on 04/09/2019 at 2:21 PM  Between 7am to 6pm - Pager - [phone]  After 6pm go to [email or web address] - password [email or web address]  Office  [phone]  CC: Primary care physician; [name], MD

## 2019-04-09 NOTE — Consult Note (Signed)
Hematology/Oncology Consult note [facility] [phone] [phone]  Patient Care Team: [name], MD as PCP - General (Family Medicine)   Name of the patient: Vincent Poole  [id number]  June 12, 1935    Reason for consult: Metastatic bladder cancer   Referring physician-Dr. Tressia Miners  Date of visit: 04/09/2019   History of presenting illness- Patient is a 84 year old male with history of locally advanced bladder cancer currently on Tecentriq.  He recently had bilateral nephrostomy tubes placed and he is admitted at this time for malfunctioning right nephrostomy tube.  Overall he has had a declining performance status and did not receive his planned dose of Tecentriq on 04/04/2019.  Plan was to see how he does without any treatment for the next 1 month.  If his performance status failed to improve plan was to consider home hospice.  Currently patient reports he is feeling better and hopes to go home today.  ECOG PS- 3  Pain scale- 0   Review of systems- Review of Systems  Constitutional: Positive for malaise/fatigue. Negative for chills, fever and weight loss.  HENT: Negative for congestion, ear discharge and nosebleeds.   Eyes: Negative for blurred vision.  Respiratory: Negative for cough, hemoptysis, sputum production, shortness of breath and wheezing.   Cardiovascular: Negative for chest pain, palpitations, orthopnea and claudication.  Gastrointestinal: Negative for abdominal pain, blood in stool, constipation, diarrhea, heartburn, melena, nausea and vomiting.  Genitourinary: Negative for dysuria, flank pain, frequency, hematuria and urgency.  Musculoskeletal: Negative for back pain, joint pain and myalgias.  Skin: Negative for rash.  Neurological: Negative for dizziness, tingling, focal weakness, seizures, weakness and headaches.  Endo/Heme/Allergies: Does not bruise/bleed easily.  Psychiatric/Behavioral: Negative for  depression and suicidal ideas. The patient does not have insomnia.     No Known Allergies  Patient Active Problem List   Diagnosis Date Noted   Hydronephrosis, right 04/08/2019   Pressure injury of skin 03/23/2019   SIRS (systemic inflammatory response syndrome) ([place]) 03/22/2019   Leukocytosis 35/32/9924   Complicated UTI (urinary tract infection) 11/14/2018   Palliative care encounter    Iron deficiency anemia 09/14/2018   Hip fracture ([place]) 05/01/2018   Malignant neoplasm of urinary bladder ([place]) 01/12/2018   Goals of care, counseling/discussion 01/12/2018   Pyelonephritis 05/31/2017   UTI (urinary tract infection) 03/04/2017   Urinary obstruction    Essential hypertension 08/30/2016   History of shingles 08/30/2016   Prostate cancer ([place]) 08/30/2016   Urinary retention 08/30/2016   Medicare annual wellness visit, initial 08/01/2016   Medicare annual wellness visit, subsequent 08/01/2016   Sepsis ([place]) 07/11/2016   Borderline diabetes mellitus 01/26/2016   Vaccine counseling 01/26/2015   Lumbar radiculitis 06/24/2014   OA (osteoarthritis) of hip 06/24/2014   Malignant neoplasm of prostate ([place]) 01/27/2011     Past Medical History:  Diagnosis Date   Anxiety    Arthritis    Bladder cancer ([place])    Dysrhythmia 07/2018   history of atrial flutter that worsens with anxiety   Femur fracture, right ([place]) 05/01/2018   GERD (gastroesophageal reflux disease)    History of recent blood transfusion 05/2018   Hypertension    Iron deficiency anemia 09/14/2018   Prostate cancer ([place]) 07/2018   cancer growing in prostate but not prostate cancer, it is from the bladder   Umbilical hernia 26/8341   Urinary retention 2019   foley catheter place 11/2017   UTI (urinary tract infection) 2019   frequent UTI's over last year  Wound eschar of foot 07/2018   left heal getting wrapped and requiring antibiotic cream. cracks open with weight bearing.       Past Surgical History:  Procedure Laterality Date   CARPAL TUNNEL RELEASE Right    CHOLECYSTECTOMY  2004   CYSTOGRAM  07/18/2018   Procedure: CYSTOGRAM;  Surgeon: [name], MD;  Location: [facility];  Service: Urology;;   CYSTOSCOPY W/ RETROGRADES Bilateral 07/18/2018   Procedure: CYSTOSCOPY WITH RETROGRADE PYELOGRAM;  Surgeon: [name], MD;  Location: [facility];  Service: Urology;  Laterality: Bilateral;   CYSTOSCOPY W/ URETERAL STENT PLACEMENT Bilateral 12/27/2017   Procedure: CYSTOSCOPY WITH RETROGRADE PYELOGRAM/URETERAL STENT PLACEMENT;  Surgeon: [name], MD;  Location: [facility];  Service: Urology;  Laterality: Bilateral;   CYSTOSCOPY W/ URETERAL STENT PLACEMENT Bilateral 07/18/2018   Procedure: CYSTOSCOPY WITH STENT REPLACEMENT (exchange);  Surgeon: [name], MD;  Location: [facility];  Service: Urology;  Laterality: Bilateral;   CYSTOSCOPY WITH STENT PLACEMENT Bilateral 11/12/2018   Procedure: [place] WITH STENT Exchange;  Surgeon: [name], MD;  Location: [facility];  Service: Urology;  Laterality: Bilateral;   INTRAMEDULLARY (IM) NAIL INTERTROCHANTERIC Right 05/02/2018   Procedure: INTRAMEDULLARY (IM) NAIL INTERTROCHANTRIC;  Surgeon: [name], MD;  Location: [facility];  Service: Orthopedics;  Laterality: Right;   IR NEPHROSTOMY EXCHANGE LEFT  03/25/2019   IR NEPHROSTOMY EXCHANGE RIGHT  03/25/2019   IR NEPHROSTOMY PLACEMENT LEFT  02/23/2019   IR NEPHROSTOMY PLACEMENT RIGHT  02/23/2019   LEG TENDON SURGERY Right 1958   PORTA CATH INSERTION N/A 01/22/2018   Procedure: PORTA CATH INSERTION;  Surgeon: [name], MD;  Location: [facility];  Service: Cardiovascular;  Laterality: N/A;   TRANSURETHRAL RESECTION OF BLADDER TUMOR N/A 12/27/2017   Procedure: TRANSURETHRAL RESECTION OF BLADDER TUMOR (TURBT);  Surgeon: [name], MD;  Location: [facility];  Service: Urology;  Laterality: N/A;   TRANSURETHRAL RESECTION OF BLADDER  TUMOR N/A 01/15/2018   Procedure: TRANSURETHRAL RESECTION OF BLADDER TUMOR (TURBT);  Surgeon: [name], MD;  Location: [facility];  Service: Urology;  Laterality: N/A;  Need 2 hrs for this case please   TRANSURETHRAL RESECTION OF BLADDER TUMOR N/A 07/18/2018   Procedure: TRANSURETHRAL RESECTION OF BLADDER TUMOR (TURBT);  Surgeon: [name], MD;  Location: [facility];  Service: Urology;  Laterality: N/A;    Social History   Socioeconomic History   Marital status: Married    Spouse name: ruth   Number of children: Not on file   Years of education: Not on file   Highest education level: Not on file  Occupational History   Occupation: retired    Comment: Therapist, [employer]  Social Needs   Emergency planning/management officer strain: Not on file   Food insecurity:    Worry: Not on file    Inability: Not on file   Transportation needs:    Medical: Not on file    Non-medical: Not on file  Tobacco Use   Smoking status: Never Smoker   Smokeless tobacco: Never Used  Substance and Sexual Activity   Alcohol use: No    Alcohol/week: 0.0 standard drinks   Drug use: No   Sexual activity: Not Currently  Lifestyle   Physical activity:    Days per week: Not on file    Minutes per session: Not on file   Stress: Not on file  Relationships   Social connections:    Talks on phone: Not on file    Gets together: Not on file  Attends religious service: Not on file    Active member of club or organization: Not on file    Attends meetings of clubs or organizations: Not on file    Relationship status: Not on file   Intimate partner violence:    Fear of current or ex partner: Not on file    Emotionally abused: Not on file    Physically abused: Not on file    Forced sexual activity: Not on file  Other Topics Concern   Not on file  Social History Narrative   Not on file     Family History  Problem Relation Age of Onset   Cancer Mother    Chronic Renal Failure Mother    Heart  disease Father      Current Facility-Administered Medications:    0.9 %  sodium chloride infusion, , Intravenous, Continuous, [place] Lighter, MD, Last Rate: 75 mL/hr at 04/09/19 0438   acetaminophen (TYLENOL) tablet 650 mg, 650 mg, Oral, Q6H PRN **OR** acetaminophen (TYLENOL) suppository 650 mg, 650 mg, Rectal, Q6H PRN, Gladstone Lighter, MD   apixaban (ELIQUIS) tablet 5 mg, 5 mg, Oral, BID, Gladstone Lighter, MD, 5 mg at 04/09/19 1120   cefTRIAXone (ROCEPHIN) 1 g in sodium chloride 0.9 % 100 mL IVPB, 1 g, Intravenous, q1800, Gladstone Lighter, MD, Last Rate: 200 mL/hr at 04/08/19 1545, 1 g at 04/08/19 1545   docusate sodium (COLACE) capsule 100 mg, 100 mg, Oral, BID, Tressia Miners, Radhika, MD, 100 mg at 04/09/19 1120   ferrous sulfate tablet 325 mg, 325 mg, Oral, BID WC, Gladstone Lighter, MD, 325 mg at 04/09/19 1121   fluconazole (DIFLUCAN) tablet 200 mg, 200 mg, Oral, Daily, Tressia Miners, Radhika, MD   mirabegron ER (MYRBETRIQ) tablet 50 mg, 50 mg, Oral, Daily, Tressia Miners, Radhika, MD, 50 mg at 04/09/19 1120   ondansetron (ZOFRAN) tablet 4 mg, 4 mg, Oral, Q6H PRN **OR** ondansetron (ZOFRAN) injection 4 mg, 4 mg, Intravenous, Q6H PRN, Gladstone Lighter, MD   oxyCODONE (Oxy IR/ROXICODONE) immediate release tablet 5 mg, 5 mg, Oral, Q4H PRN, Gladstone Lighter, MD, 5 mg at 04/08/19 6812   polyethylene glycol (MIRALAX / GLYCOLAX) packet 17 g, 17 g, Oral, Daily PRN, Gladstone Lighter, MD   vitamin B-12 (CYANOCOBALAMIN) tablet 500 mcg, 500 mcg, Oral, Daily, Gladstone Lighter, MD, 500 mcg at 04/09/19 1120   Physical exam:  Vitals:   04/08/19 1300 04/08/19 1919 04/09/19 0504 04/09/19 1110  BP: 120/67 (!) 113/57 114/65 110/65  Pulse: 92 100 92 96  Resp: 20 15 16 20   Temp: 98.2 F (36.8 C) 98.6 F (37 C) 98.1 F (36.7 C)   TempSrc: Oral Oral Oral   SpO2: 100% 98% 99% 98%  Weight:      Height:       Physical Exam Constitutional:      Comments: Frail elderly gentleman who  appears fatigued  HENT:     Head: Normocephalic and atraumatic.  Eyes:     Pupils: Pupils are equal, round, and reactive to light.  Neck:     Musculoskeletal: Normal range of motion.  Cardiovascular:     Rate and Rhythm: Normal rate and regular rhythm.     Heart sounds: Normal heart sounds.  Pulmonary:     Effort: Pulmonary effort is normal.     Breath sounds: Normal breath sounds.  Abdominal:     General: Bowel sounds are normal. There is no distension.     Palpations: Abdomen is soft.     Tenderness: There is no abdominal  tenderness.     Comments: Bilateral nephrostomy tubes in place  Skin:    General: Skin is warm and dry.  Neurological:     Mental Status: He is alert and oriented to person, place, and time.        CMP Latest Ref Rng & Units 04/09/2019  Glucose 70 - 99 mg/dL 90  BUN 8 - 23 mg/dL 25(H)  Creatinine 0.61 - 1.24 mg/dL 1.26(H)  Sodium 135 - 145 mmol/L 136  Potassium 3.5 - 5.1 mmol/L 3.6  Chloride 98 - 111 mmol/L 106  CO2 22 - 32 mmol/L 23  Calcium 8.9 - 10.3 mg/dL 8.2(L)  Total Protein 6.5 - 8.1 g/dL -  Total Bilirubin 0.3 - 1.2 mg/dL -  Alkaline Phos 38 - 126 U/L -  AST 15 - 41 U/L -  ALT 0 - 44 U/L -   CBC Latest Ref Rng & Units 04/09/2019  WBC 4.0 - 10.5 K/uL 24.7(H)  Hemoglobin 13.0 - 17.0 g/dL 7.6(L)  Hematocrit 39.0 - 52.0 % 24.2(L)  Platelets 150 - 400 K/uL 432(H)    @IMAGES @  Ct Renal Stone Study  Result Date: 04/08/2019 CLINICAL DATA:  84 year old male with lack of nephrostomy tube drainage for 24-36 hours with flank pain. Bladder carcinoma. EXAM: CT ABDOMEN AND PELVIS WITHOUT CONTRAST TECHNIQUE: Multidetector CT imaging of the abdomen and pelvis was performed following the standard protocol without IV contrast. COMPARISON:  CT Abdomen and Pelvis 03/22/2019 and earlier. FINDINGS: Lower chest: Small volume pericardial effusion is new since last month with simple fluid density. Small layering right pleural effusion is also new. No cardiomegaly. No  new lung base opacity. Hepatobiliary: Surgically absent gallbladder. Negative noncontrast liver. Pancreas: Stable, atrophied. Spleen: Negative. Adrenals/Urinary Tract: Stable adrenal glands. Bilateral nephrostomy tubes. The left kidney is decompressed now compared to 03/22/2019. There is new right hydronephrosis. Mild right pararenal stranding is stable. No discontinuity or kinking of the right nephrostomy tube is identified. New right hydroureter. Partially necrotic urinary bladder tumor redemonstrated and not significantly changed. There are distal small bowel loops inseparable from the abnormal urinary bladder. Stomach/Bowel: Increased retained stool throughout the large bowel with 6 centimeter stool ball in the rectum. Redundant sigmoid. Redundant transverse colon. No large bowel wall thickening. Appendix remains within normal limits. No dilated small bowel. Decompressed stomach. No free air. No free fluid identified. Vascular/Lymphatic: Vascular patency is not evaluated in the absence of IV contrast. Extensive Aortoiliac calcified atherosclerosis. Reproductive: Stable. Other: No pelvic free fluid. Musculoskeletal: Stable visualized osseous structures. IMPRESSION: 1. New right hydronephrosis and hydroureter since 03/22/19, but right nephrostomy tube remains in place with no adverse features. The tube may be clogged. 2. New small pericardial and layering right pleural effusions. 3. The left kidney is decompressed since the prior with left nephrostomy tube in place. 4. Partially necrotic urinary bladder tumor not significantly changed. Note that this is inseparable from some small bowel loops. 5. Increased retained stool throughout the large bowel with stool ball in the rectum suspicious for fecal impaction. Electronically Signed   By: Genevie Ann M.D.   On: 04/08/2019 05:56   Ct Renal Stone Study  Result Date: 03/22/2019 CLINICAL DATA:  Left kidney catheter would not drain EXAM: CT ABDOMEN AND PELVIS WITHOUT  CONTRAST TECHNIQUE: Multidetector CT imaging of the abdomen and pelvis was performed following the standard protocol without IV contrast. COMPARISON:  CT abdomen pelvis, 02/21/2019 FINDINGS: Lower chest: No acute abnormality. Three-vessel coronary artery calcifications. Hepatobiliary: No focal liver abnormality is seen. Status  post cholecystectomy. No biliary dilatation. Pancreas: Unremarkable. No pancreatic ductal dilatation or surrounding inflammatory changes. Spleen: Normal in size without focal abnormality. Adrenals/Urinary Tract: Adrenal glands are unremarkable. There has been interval placement of bilateral percutaneous nephrostomy catheters, with formed pigtails in the bilateral renal pelves. There is moderate left hydronephrosis and hydroureter, with decompression of the right collecting system. Redemonstrated, large, partially necrotic mass of the bladder. There has been interval removal of a previously seen Foley catheter. There is air and fluid within the bladder lumen. Stomach/Bowel: Stomach is within normal limits. No evidence of bowel wall thickening, distention, or inflammatory changes. Large burden of stool in the colon. Vascular/Lymphatic: Scattered calcific atherosclerosis. No enlarged abdominal or pelvic lymph nodes. Reproductive: No mass or other abnormality. Other: No abdominal wall hernia or abnormality. No abdominopelvic ascites. Musculoskeletal: No acute or significant osseous findings. Severe osteopenia. IMPRESSION: 1. There has been interval placement of bilateral percutaneous nephrostomy catheters, with formed pigtails in the bilateral renal pelves. There is moderate left hydronephrosis and hydroureter similar to prior examination, with decompression of the right collecting system. 2. Redemonstrated, large, partially necrotic mass of the bladder. There has been interval removal of a previously seen Foley catheter. There is air and fluid within the bladder lumen. Electronically Signed   By:  Eddie Candle M.D.   On: 03/22/2019 19:12   Ir Nephrostomy Exchange Left  Result Date: 03/25/2019 INDICATION: 84 year old male with a history of nonfunctional percutaneous nephrostomy EXAM: IR EXCHANGE NEPHROSTOMY LEFT; IR EXCHANGE NEPHROSTOMY RIGHT COMPARISON:  None. MEDICATIONS: None ANESTHESIA/SEDATION: None CONTRAST:  62mL OMNIPAQUE IOHEXOL 300 MG/ML SOLN - administered into the collecting system(s) FLUOROSCOPY TIME:  Fluoroscopy Time: 1 minutes 6 seconds (16.8 mGy). COMPLICATIONS: None PROCEDURE: Informed written consent was obtained from the patient after a thorough discussion of the procedural risks, benefits and alternatives. All questions were addressed. Maximal Sterile Barrier Technique was utilized including caps, mask, sterile gowns, sterile gloves, sterile drape, hand hygiene and skin antiseptic. A timeout was performed prior to the initiation of the procedure. Patient positioned in the supine position on the fluoroscopy table. The patient was prepped and draped in the usual sterile fashion. Left: 1% lidocaine was used for local anesthesia. Small amount of contrast was infused confirming location in the collecting system. Modified Seldinger technique was then used to exchange for a new 10 [place] percutaneous nephrostomy. Catheter was formed in the collecting system and contrast confirmed location. Catheter was sutured in location and patch to gravity drainage. Right: 1% lidocaine was used for local anesthesia. Small amount of contrast was infused confirming location in the collecting system. Modified Seldinger technique was then used to exchange for a new 10 [place] percutaneous nephrostomy. Catheter was formed in the collecting system and contrast confirmed location. Catheter was sutured in location and patch to gravity drainage. Patient tolerated the procedure well and remained hemodynamically stable throughout. No complications were encountered and no significant blood loss. IMPRESSION: Status post  exchange of bilateral percutaneous nephrostomy. Signed, Dulcy Fanny. Dellia Nims, RPVI [facility] Radiology Electronically Signed   By: Corrie Mckusick D.O.   On: 03/25/2019 14:51   Ir Nephrostomy Exchange Right  Result Date: 03/25/2019 INDICATION: 84 year old male with a history of nonfunctional percutaneous nephrostomy EXAM: IR EXCHANGE NEPHROSTOMY LEFT; IR EXCHANGE NEPHROSTOMY RIGHT COMPARISON:  None. MEDICATIONS: None ANESTHESIA/SEDATION: None CONTRAST:  65mL OMNIPAQUE IOHEXOL 300 MG/ML SOLN - administered into the collecting system(s) FLUOROSCOPY TIME:  Fluoroscopy Time: 1 minutes 6 seconds (16.8 mGy). COMPLICATIONS: None PROCEDURE: Informed  written consent was obtained from the patient after a thorough discussion of the procedural risks, benefits and alternatives. All questions were addressed. Maximal Sterile Barrier Technique was utilized including caps, mask, sterile gowns, sterile gloves, sterile drape, hand hygiene and skin antiseptic. A timeout was performed prior to the initiation of the procedure. Patient positioned in the supine position on the fluoroscopy table. The patient was prepped and draped in the usual sterile fashion. Left: 1% lidocaine was used for local anesthesia. Small amount of contrast was infused confirming location in the collecting system. Modified Seldinger technique was then used to exchange for a new 10 [place] percutaneous nephrostomy. Catheter was formed in the collecting system and contrast confirmed location. Catheter was sutured in location and patch to gravity drainage. Right: 1% lidocaine was used for local anesthesia. Small amount of contrast was infused confirming location in the collecting system. Modified Seldinger technique was then used to exchange for a new 10 [place] percutaneous nephrostomy. Catheter was formed in the collecting system and contrast confirmed location. Catheter was sutured in location and patch to gravity  drainage. Patient tolerated the procedure well and remained hemodynamically stable throughout. No complications were encountered and no significant blood loss. IMPRESSION: Status post exchange of bilateral percutaneous nephrostomy. Signed, Dulcy Fanny. Dellia Nims, RPVI [facility] Radiology Electronically Signed   By: Corrie Mckusick D.O.   On: 03/25/2019 14:51    Assessment and plan- Patient is a 84 y.o. male with locally advanced muscle invasive bladder cancer currently on second line Tecentriq  1.  Patient has chronically elevated white count possibly secondary to tumor burden.  He is currently on Rocephin and fluconazole for possible UTI.  His last urine culture grew yeast.  2.  Anemia secondary to chronic disease.  Continue to monitor  3.  Goals of care: All treatment currently on hold and plan was to give him 1 month to see if he feels stronger to continue treatment and if he does not then he will be transitioned to home hospice.  It would also be reasonable to consider hospice regardless at this time.  I tried calling his wife but she did not pick up her phone.  I will try calling her again today.  He has an appointment with me as an outpatient 3 weeks from now   Thank you for this kind referral and the opportunity to participate in the care of this patient   Visit Diagnosis 1. Acute right flank pain   2. Hydronephrosis   3. Obstructed nephrostomy tube [facility])     Dr. Randa Evens, MD, MPH [facility] at [facility] [phone] 04/09/2019 1:22 PM

## 2019-04-09 NOTE — TOC Initial Note (Addendum)
Transition of Care [facility]) - Initial/Assessment Note    Patient Details  Name: Vincent Poole MRN: [id number] Date of Birth: [birth year]  Transition of Care [facility]) CM/SW Contact:    Marshell Garfinkel, RN Phone Number: 04/09/2019, 11:27 AM  Clinical Narrative:                 Met with patient to deliver Code 44. He is very hard of hearing but said he understands stated information from [place]. Message left for patient's wife at [phone] to call to discuss transition of care. He states he does not want anyone in his home and declined home health services. He says his niece is familiar with nephrostomy care and he states he has supplies at home.  Update at 33: Spoke with Mrs. Pischke at [phone]. She does not feel that home health will help him. She had a bad experience with SNF and doesn't want to do that again. She states she has flushes and will need a syringe to fit it. Dr. Erlene Quan has been supplying him with supplies.  RNCM asked nurse to send some supplies home with him and she wants to be shown by hospital nurse how to treat.  Granddaughter is a Quarry manager and "she knows how to do this".  She again refused home health services.  He has 24 hour care at home and "he can ride in car to home". RN updated; MD updated.        Patient Goals and CMS Choice        Expected Discharge Plan and Services                                                Prior Living Arrangements/Services                       Activities of Daily Living Home Assistive Devices/Equipment: Gilford Rile (specify type) ADL Screening (condition at time of admission) Patient's cognitive ability adequate to safely complete daily activities?: Yes Is the patient deaf or have difficulty hearing?: Yes Does the patient have difficulty seeing, even when wearing glasses/contacts?: No Does the patient have difficulty concentrating, remembering, or making decisions?: No Patient able to express need for assistance with  ADLs?: Yes Does the patient have difficulty dressing or bathing?: Yes Independently performs ADLs?: No Communication: Independent Dressing (OT): Dependent Is this a change from baseline?: Pre-admission baseline Grooming: Dependent Is this a change from baseline?: Pre-admission baseline Feeding: Independent Bathing: Dependent Is this a change from baseline?: Pre-admission baseline Toileting: Dependent Is this a change from baseline?: Pre-admission baseline In/Out Bed: Dependent Is this a change from baseline?: Pre-admission baseline Walks in Home: Dependent Is this a change from baseline?: Pre-admission baseline Does the patient have difficulty walking or climbing stairs?: Yes Weakness of Legs: Both Weakness of Arms/Hands: None  Permission Sought/Granted                  Emotional Assessment              Admission diagnosis:  Hydronephrosis [N13.30] Acute right flank pain [R10.9] Obstructed nephrostomy tube ([place]) [T83.092A] Hydronephrosis, right [N13.30] Patient Active Problem List   Diagnosis Date Noted  . Hydronephrosis, right 04/08/2019  . Pressure injury of skin 03/23/2019  . SIRS (systemic inflammatory response syndrome) ([place]) 03/22/2019  . Leukocytosis 02/21/2019  . Complicated UTI (urinary  tract infection) 11/14/2018  . Palliative care encounter   . Iron deficiency anemia 09/14/2018  . Hip fracture ([place]) 05/01/2018  . Malignant neoplasm of urinary bladder ([place]) 01/12/2018  . Goals of care, counseling/discussion 01/12/2018  . Pyelonephritis 05/31/2017  . UTI (urinary tract infection) 03/04/2017  . Urinary obstruction   . Essential hypertension 08/30/2016  . History of shingles 08/30/2016  . Prostate cancer ([place]) 08/30/2016  . Urinary retention 08/30/2016  . Medicare annual wellness visit, initial 08/01/2016  . Medicare annual wellness visit, subsequent 08/01/2016  . Sepsis ([place]) 07/11/2016  . Borderline diabetes mellitus 01/26/2016  . Vaccine  counseling 01/26/2015  . Lumbar radiculitis 06/24/2014  . OA (osteoarthritis) of hip 06/24/2014  . Malignant neoplasm of prostate ([place]) 01/27/2011   PCP:  [name], MD Pharmacy:   [facility] [address] [place] [address] Phone: [phone] Fax: [phone]  [place] [place] [address] Phone: [phone] Fax: [phone]     Social Determinants of Health (SDOH) Interventions    Readmission Risk Interventions Readmission Risk Prevention Plan 03/26/2019 03/24/2019 02/26/2019  Transportation Screening - Complete Complete  PCP or Specialist Appt within 3-5 Days - - Complete  HRI or [place] - Complete -  Social Work Consult for [place] Planning/Counseling - - Complete  Palliative Care Screening Complete - -  Medication Review Press photographer) - - Complete  Some recent data might be hidden

## 2019-04-09 NOTE — Care Management Obs Status (Signed)
[place] NOTIFICATION   Patient Details  Name: Vincent Poole MRN: [id number] Date of Birth: [birth year]   Medicare Observation Status Notification Given:  Yes   Marshell Garfinkel, RN 04/09/2019, 8:30 AM

## 2019-04-10 DIAGNOSIS — N1330 Unspecified hydronephrosis: Secondary | ICD-10-CM | POA: Diagnosis not present

## 2019-04-10 DIAGNOSIS — Z515 Encounter for palliative care: Secondary | ICD-10-CM | POA: Diagnosis not present

## 2019-04-10 DIAGNOSIS — T83092A Other mechanical complication of nephrostomy catheter, initial encounter: Secondary | ICD-10-CM | POA: Diagnosis not present

## 2019-04-10 DIAGNOSIS — Z7189 Other specified counseling: Secondary | ICD-10-CM

## 2019-04-10 LAB — GLUCOSE, CAPILLARY: Glucose-Capillary: 88 mg/dL (ref 70–99)

## 2019-04-10 NOTE — Consult Note (Signed)
Consultation Note Date: 04/10/2019   Patient Name: Vincent Poole  DOB: 10-19-1935  MRN: [id number]  Age / Sex: 84 y.o., male   PCP: [name], MD Referring Physician: No att. providers found   REASON FOR CONSULTATION:Establishing goals of care  Palliative Care consult requested for this 84 y.o. 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 He was recently admitted 2 weeks ago for bilateral nephrostomy exchange and UTI which was treated. He presented with worsening abdominal and right flank pain. It was reported that right nephrostomy had not been drained for a day. Reports of intermittent confusion and generalized weakness prior to admission.   Clinical Assessment and Goals of Care: I have reviewed medical records including lab results, imaging, Epic notes, and MAR, received report from the bedside RN, and assessed the patient. I met at the bedside with patient and spoke with his wife, Vincent Poole via phone  to discuss diagnosis prognosis, [place], EOL wishes, disposition and options. Patient is awake, alert, and oriented x3. He answered all questions appropriately. Denied pain or shortness of breath. Hard of hearing.   I introduced Palliative Medicine as specialized medical care for people living with serious illness. It focuses on providing relief from the symptoms and stress of a serious illness. The goal is to improve quality of life for both the patient and the family. Both patient and wife verbalized understanding.   We discussed a brief life review of the patient, along with functional and nutritional status. Patient reports he has been married to his wife  for almost 80 years. They have 3 children, 2 of which lives in the home with them along with their grandchildren. 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   Prior to admission wife reports noticeable change in his ability to provide independent ADLs. Patient reports he would often stay in bed or recliner. Son or wife would assist with bathing and transfers at time. Patient states he was able to walk short distances at times with a walker. He reports eating at least 2 meals a day, however wife reports sometimes it was only several bites and then other times it may be half of the meal. Patient also was experiencing some confusion prior to admission, which seems to be resolved per wife.   We discussed His current illness and what it means in the larger context of His on-going co-morbidities. With specific discussions regarding his locally invasive cancer, poor performance, weakness, and poor nutritional intake. Natural disease trajectory and expectations at EOL were discussed. Patient is tearful and expressed "I know my days are numbered that is why I don't want to be in here!" Wife expresses she remains hopeful that patient will show some improvement but is also beginning to accept and prepare for the worst, which is no recovery and end-of-life support.   Patient explains that he is not interested in going to a facility or having home care because he is not up to it and he does not want anyone coming in and out of his home. Wife agrees and expresses that they have family members they trust that can assist with care needs.   I attempted to elicit values and goals of care important to the  patient.    The difference between aggressive medical intervention and comfort care was considered in light of the patient's goals of care. Patient and wife shared their goals are to continue to treat as much as possible until they have discussed amongst themselves further and with their  children. I discussed patients poor performance and overall poor prognosis. Patient stated "I just want to be home with my wife and family. I don't want to suffer, but I am not ready to leave my family!" Patient is tearful with his comments. He states "my family means everything to me, and it is painful being here and not at home. I have never been away from my wife more than day. She must love me because she keeps caring for me and putting up with all that I go through. I can't give up on her because she never gave up on me after all of these years!"   Advanced directives, concepts specific to code status, artifical feeding and hydration, and rehospitalization were considered and discussed. I discussed patient's full code status with consideration to his current illness and co-morbidities. Patient states he does not want to be on life support for long periods of time, but acknowledges he is not ready to give up. I explained that by making a decision regarding his code status does not mean that he is necessarily given up but allowing nature to take it's course without aggressive/heroic interventions. Patient and wife verbalized understanding and expressed they would need to talk more about it and with their children once he is back home. He endorses that he would not want any forms of artificial feedings such as a PEG tube. Patient does have a documented advanced directive which he expresses names his wife and son as the medical decision makers.   Hospice and Palliative Care services outpatient were explained and offered. Patient and family verbalized their understanding and awareness of both palliative and hospice's goals and philosophy of care. Again they continued to express they are not interested in more "people" coming into their home. Explained that they would be of great support and if consideration of palliative support at minimum they would only come out once or month or longer. They verbalized  understanding and expressed they would consider. I introduced Josh, NP as a resource if patient continues with visits at the cancer center.   Questions and concerns were addressed.  Hard Choices booklet and documentation discussing the differences between palliative and hospice left for review. The family was encouraged to call with questions or concerns.  PMT will continue to support holistically.   SOCIAL HISTORY:     reports that he has never smoked. He has never used smokeless tobacco. He reports that he does not drink alcohol or use drugs.  CODE STATUS: Full code  ADVANCE DIRECTIVES: Primary Decision Maker: NEXT OF KIN    SYMPTOM MANAGEMENT: Per attending   Palliative Prophylaxis:   Aspiration, Bowel Regimen, Delirium Protocol and Frequent Pain Assessment  PSYCHO-SOCIAL/SPIRITUAL:  Support System: Family   Desire for further Chaplaincy support:NO   Additional Recommendations (Limitations, Scope, Preferences):  Full Scope Treatment and No Artificial Feeding   PAST MEDICAL HISTORY: Past Medical History:  Diagnosis Date   Anxiety    Arthritis    Bladder cancer ([place])    Dysrhythmia 07/2018   history of atrial flutter that worsens with anxiety   Femur fracture, right ([place]) 05/01/2018   GERD (gastroesophageal reflux disease)    History of recent blood transfusion 05/2018  Hypertension    Iron deficiency anemia 09/14/2018   Prostate cancer ([place]) 07/2018   cancer growing in prostate but not prostate cancer, it is from the bladder   Umbilical hernia 18/4037   Urinary retention 2019   foley catheter place 11/2017   UTI (urinary tract infection) 2019   frequent UTI's over last year   Wound eschar of foot 07/2018   left heal getting wrapped and requiring antibiotic cream. cracks open with weight bearing.    PAST SURGICAL HISTORY:  Past Surgical History:  Procedure Laterality Date   CARPAL TUNNEL RELEASE Right    CHOLECYSTECTOMY  2004   CYSTOGRAM   07/18/2018   Procedure: CYSTOGRAM;  Surgeon: [name], MD;  Location: [facility] ORS;  Service: Urology;;   Consuela Mimes W/ RETROGRADES Bilateral 07/18/2018   Procedure: CYSTOSCOPY WITH RETROGRADE PYELOGRAM;  Surgeon: [name], MD;  Location: [facility] ORS;  Service: Urology;  Laterality: Bilateral;   CYSTOSCOPY W/ URETERAL STENT PLACEMENT Bilateral 12/27/2017   Procedure: CYSTOSCOPY WITH RETROGRADE PYELOGRAM/URETERAL STENT PLACEMENT;  Surgeon: [name], MD;  Location: [facility] ORS;  Service: Urology;  Laterality: Bilateral;   CYSTOSCOPY W/ URETERAL STENT PLACEMENT Bilateral 07/18/2018   Procedure: CYSTOSCOPY WITH STENT REPLACEMENT (exchange);  Surgeon: [name], MD;  Location: [facility] ORS;  Service: Urology;  Laterality: Bilateral;   CYSTOSCOPY WITH STENT PLACEMENT Bilateral 11/12/2018   Procedure: [place] WITH STENT Exchange;  Surgeon: [name], MD;  Location: [facility] OR[facility];  Service: Urology;  Laterality: Bilateral;   INTRAMEDULLARY (IM) NAIL INTERTROCHANTERIC Right 05/02/2018   Procedure: INTRAMEDULLARY (IM) NAIL INTERTROCHANTRIC;  Surgeon: [name], MD;  Location: [facility] OR[facility];  Service: Orthopedics;  Laterality: Right;   IR NEPHROSTOMY EXCHANGE LEFT  03/25/2019   IR NEPHROSTOMY EXCHANGE RIGHT  03/25/2019   IR NEPHROSTOMY PLACEMENT LEFT  02/23/2019   IR NEPHROSTOMY PLACEMENT RIGHT  02/23/2019   LEG TENDON SURGERY Right 1958   PORTA CATH INSERTION N/A 01/22/2018   Procedure: PORTA CATH INSERTION;  Surgeon: [name], MD;  Location: [facility];  Service: Cardiovascular;  Laterality: N/A;   TRANSURETHRAL RESECTION OF BLADDER TUMOR N/A 12/27/2017   Procedure: TRANSURETHRAL RESECTION OF BLADDER TUMOR (TURBT);  Surgeon: [name], MD;  Location: [facility];  Service: Urology;  Laterality: N/A;   TRANSURETHRAL RESECTION OF BLADDER TUMOR N/A 01/15/2018   Procedure: TRANSURETHRAL RESECTION OF BLADDER TUMOR (TURBT);  Surgeon: [name], MD;  Location: [facility];   Service: Urology;  Laterality: N/A;  Need 2 hrs for this case please   TRANSURETHRAL RESECTION OF BLADDER TUMOR N/A 07/18/2018   Procedure: TRANSURETHRAL RESECTION OF BLADDER TUMOR (TURBT);  Surgeon: [name], MD;  Location: [facility];  Service: Urology;  Laterality: N/A;    ALLERGIES:  has No Known Allergies.   MEDICATIONS:  Current Facility-Administered Medications  Medication Dose Route Frequency Provider Last Rate Last Dose   0.9 %  sodium chloride infusion   Intravenous Continuous Gladstone Lighter, MD   Stopped at 04/10/19 0845   acetaminophen (TYLENOL) tablet 650 mg  650 mg Oral Q6H PRN Gladstone Lighter, MD       Or   acetaminophen (TYLENOL) suppository 650 mg  650 mg Rectal Q6H PRN Gladstone Lighter, MD       apixaban [facility] [place]) tablet 5 mg  5 mg Oral BID Gladstone Lighter, MD   5 mg at 04/10/19 0939   cefTRIAXone (ROCEPHIN) 1 g in sodium chloride 0.9 % 100 mL IVPB  1 g Intravenous q1800 Gladstone Lighter, MD 200 mL/hr at 04/09/19  1901 1 g at 04/09/19 1901   docusate sodium (COLACE) capsule 100 mg  100 mg Oral BID Gladstone Lighter, MD   100 mg at 04/10/19 6415   ferrous sulfate tablet 325 mg  325 mg Oral BID WC Gladstone Lighter, MD   325 mg at 04/10/19 0939   fluconazole (DIFLUCAN) tablet 200 mg  200 mg Oral Daily Gladstone Lighter, MD   200 mg at 04/10/19 0939   mirabegron ER (MYRBETRIQ) tablet 50 mg  50 mg Oral Daily Gladstone Lighter, MD   50 mg at 04/10/19 0939   ondansetron (ZOFRAN) tablet 4 mg  4 mg Oral Q6H PRN Gladstone Lighter, MD       Or   ondansetron [facility]) injection 4 mg  4 mg Intravenous Q6H PRN Gladstone Lighter, MD       oxyCODONE (Oxy IR/ROXICODONE) immediate release tablet 5 mg  5 mg Oral Q4H PRN Gladstone Lighter, MD   5 mg at 04/08/19 8309   polyethylene glycol (MIRALAX / GLYCOLAX) packet 17 g  17 g Oral Daily PRN Gladstone Lighter, MD       vitamin B-12 (CYANOCOBALAMIN) tablet 500 mcg  500 mcg Oral Daily Gladstone Lighter,  MD   500 mcg at 04/10/19 4076   Current Outpatient Medications  Medication Sig Dispense Refill   Cranberry-Cholecalciferol (SUPER CRANBERRY/VITAMIN D3) 4200-500 MG-UNIT CAPS Take 1 capsule by mouth 2 (two) times daily.     ELIQUIS 5 MG TABS tablet TAKE ONE TABLET BY MOUTH TWICE DAILY (Patient taking differently: Take 5 mg by mouth 2 (two) times daily. ) 60 tablet 3   ferrous sulfate 325 (65 FE) MG tablet Take 1 tablet (325 mg total) by mouth 2 (two) times daily with a meal.  3   Multiple Vitamin (MULTIVITAMIN WITH MINERALS) TABS tablet Take 1 tablet by mouth daily.     MYRBETRIQ 50 MG TB24 tablet TAKE ONE TABLET EVERY DAY (Patient taking differently: Take 50 mg by mouth daily. ) 30 tablet 3   oxyCODONE (OXY IR/ROXICODONE) 5 MG immediate release tablet Take 1 tablet (5 mg total) by mouth every 4 (four) hours as needed for severe pain. 60 tablet 0   potassium chloride SA (K-DUR) 20 MEQ tablet Take 20 mEq by mouth 2 (two) times daily.     vitamin B-12 (CYANOCOBALAMIN) 500 MCG tablet Take 500 mcg by mouth daily.     acetaminophen (TYLENOL) 500 MG tablet Take 1,000 mg by mouth every 6 (six) hours as needed for moderate pain or headache.      cephALEXin (KEFLEX) 500 MG capsule Take 1 capsule (500 mg total) by mouth 3 (three) times daily for 5 days. 15 capsule 0   cetirizine (ZYRTEC) 10 MG tablet Take 10 mg by mouth daily as needed for allergies.      fluconazole (DIFLUCAN) 200 MG tablet Take 1 tablet (200 mg total) by mouth daily for 10 days. 10 tablet 0   lidocaine-prilocaine (EMLA) cream Apply 1 application topically as needed. 30 g 1   nystatin (NYAMYC) powder Apply topically 2 (two) times daily. 60 g 3   polyethylene glycol powder (MIRALAX) powder Take 17 g by mouth daily as needed. Can increase to 3 times a day as needed for constipation but hold medication if has diarrhea 255 g 0   zinc oxide 20 % ointment Apply 1 application topically as needed for irritation.      VITAL  SIGNS: BP 111/75 (BP Location: Left Arm)    Pulse 93  Temp 98.4 F (36.9 C) (Oral)    Resp 14    Ht '5\' 11"'$  (1.803 m)    Wt 79.4 kg    SpO2 100%    BMI 24.41 kg/m  Filed Weights   04/08/19 0427  Weight: 79.4 kg    Estimated body mass index is 24.41 kg/m as calculated from the following:   Height as of this encounter: '5\' 11"'$  (1.803 m).   Weight as of this encounter: 79.4 kg.  LABS: CBC:    Component Value Date/Time   WBC 24.7 (H) 04/09/2019 0413   HGB 7.6 (L) 04/09/2019 0413   HGB 11.6 (L) 02/20/2019 1404   HCT 24.2 (L) 04/09/2019 0413   HCT 36.5 (L) 02/20/2019 1404   PLT 432 (H) 04/09/2019 0413   PLT 461 (H) 02/20/2019 1404   Comprehensive Metabolic Panel:    Component Value Date/Time   NA 136 04/09/2019 0413   K 3.6 04/09/2019 0413   CO2 23 04/09/2019 0413   BUN 25 (H) 04/09/2019 0413   CREATININE 1.26 (H) 04/09/2019 0413   ALBUMIN 2.5 (L) 04/08/2019 0455     Review of Systems  Neurological: Positive for weakness.  All other systems reviewed and are negative.  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   Discharge Planning:  Home with wife, patient and wife declining home health services. Would benefit from hospice support or palliative  support at minimum.   Recommendations:  Full Code-as confirmed by patient and wife  Continue with current plan of care  Patient and wife declining palliative or home health. States they already have to many people in and out of their home and a good amount of family support to assist with care.   Patient is appropriate for hospice care. Attempted to discuss palliative at minimum and advised that they do not come daily if not needed. Expressed they would consider. If patient continues to see Dr. [name] at the cancer center he would benefit from having a scheduled visit with Merrily Pew, NP for continued discussions and [place] conversations.   PMT will continue to support and follow as needed.    Palliative Performance Scale: PPS 30%               Patient and wife expressed understanding and was in agreement with this plan.   The above conversation was completed via telephone due to the visitor restrictions during the COVID-19 pandemic. Thorough chart review and discussion with necessary members of the care team was completed as part of assessment.   Thank you for allowing the Palliative Medicine Team to assist in the care of this patient.  Time In: 0900 Time Out: 1010 Time Total: 70 min.   Visit consisted of counseling and education dealing with the complex and emotionally intense issues of symptom management and palliative care in the setting of serious and potentially life-threatening illness.Greater than 50%  of  this time was spent counseling and coordinating care related to the above assessment and plan.  Signed by:  Alda Lea, AGPCNP-BC Palliative Medicine Team  Phone: [phone] Fax: [phone] Pager: [phone] Amion: Bjorn Pippin

## 2019-04-10 NOTE — TOC Transition Note (Signed)
Transition of Care [facility]) - CM/SW Discharge Note   Patient Details  Name: Vincent Poole MRN: [id number] Date of Birth: [birth year]  Transition of Care [facility]) CM/SW Contact:  Marshell Garfinkel, RN Phone Number: 04/10/2019, 11:55 AM   Clinical Narrative:    RNCM spoke with Mrs. Dunaj at [phone] and she is in route to hospital to pick patient up. She states she has made arrangements with hospital nurse for instruction on nephrostomies and supplies. She has no questions. Home health RN offered once more to both patient and wife and both again declined.    Final next level of care: Home/Self Care Barriers to Discharge: No Barriers Identified   Patient Goals and CMS Choice     Choice offered to / list presented to : Patient, Spouse  Discharge Placement                       Discharge Plan and Services                                     Social Determinants of Health (SDOH) Interventions     Readmission Risk Interventions Readmission Risk Prevention Plan 03/26/2019 03/24/2019 02/26/2019  Transportation Screening - Complete Complete  PCP or Specialist Appt within 3-5 Days - - Complete  HRI or [place] - Complete -  Social Work Consult for [place] Planning/Counseling - - Complete  Palliative Care Screening Complete - -  Medication Review Press photographer) - - Complete  Some recent data might be hidden

## 2019-04-10 NOTE — Progress Notes (Signed)
Pt for discharge home. A/o. No resp distress. Sl dc/d earlier.  bil neph tubes emptied. / intact instructions discussed with pt wife via phone. Verbalize understanding of all.plans. out via w/c at this time.

## 2019-04-10 NOTE — Discharge Summary (Signed)
[facility] NAME: Vincent Poole    MR#:  [id number]  DATE OF BIRTH:  03-21-1935  DATE OF ADMISSION:  04/08/2019   ADMITTING PHYSICIAN: Gladstone Lighter, MD  DATE OF DISCHARGE: 04/10/2019  1:19 PM  PRIMARY CARE PHYSICIAN: [name], MD   ADMISSION DIAGNOSIS:   Hydronephrosis [N13.30] Acute right flank pain [R10.9] Obstructed nephrostomy tube ([place]) [T83.092A] Hydronephrosis, right [N13.30]  DISCHARGE DIAGNOSIS:   Active Problems:   Hydronephrosis, right   SECONDARY DIAGNOSIS:   Past Medical History:  Diagnosis Date  . Anxiety   . Arthritis   . Bladder cancer ([place])   . Dysrhythmia 07/2018   history of atrial flutter that worsens with anxiety  . Femur fracture, right ([place]) 05/01/2018  . GERD (gastroesophageal reflux disease)   . History of recent blood transfusion 05/2018  . Hypertension   . Iron deficiency anemia 09/14/2018  . Prostate cancer ([place]) 07/2018   cancer growing in prostate but not prostate cancer, it is from the bladder  . Umbilical hernia 68/1157  . Urinary retention 2019   foley catheter place 11/2017  . UTI (urinary tract infection) 2019   frequent UTI's over last year  . Wound eschar of foot 07/2018   left heal getting wrapped and requiring antibiotic cream. cracks open with weight bearing.    HOSPITAL COURSE:   Vincent Poole a84 y.o.malewith a known history of locally invasive high-grade urothelial cancer status post bilateral nephrostomy tubes, history of right leg DVT in March 2020 on Eliquis, hypertension, GERD, arthritis and prior history of prostate cancer comes from home secondary to worsening abdominal and right flank pain.  1. Right-sided hydronephrosis and hydroureter-secondary to possible clogging of right nephrostomy tube. Both tubes were exchanged about 2 weeks ago during previous admission. - status post IR doing nephrostogram and unclogging of right-sided  nephrostomy tube.  Tube seems to be draining well now. -Renal function at baseline. -Most recent urine cultures growing yeast as outpatient.  Repeat culture showing mixed organisms.   -Received IV Diflucan and Rocephin in the hospital.  Being discharged on Diflucan and Keflex.  2. Locally advanced high-grade urothelial cancer-follows with urologist and oncologist. Finished chemo and radiation. Currently on Tecentriq which has been held this month due to poor performance status. -Discussed with his oncologist, confirmed poor prognosis and made recommendation for hospice. -Palliative care follow-up as outpatient  3. Chronic leukocytosis-could be from underlying cancer. In spite of treating and UTIs, his white count has remained elevated -slightly improving now.  4. History of right leg DVT in March 2020-continue Eliquis  5. Chronic anemia-stable. No indication for transfusion   Physical therapy consult requested-they have recommended rehab but patient refusing. -Wife wants to take patient home.  They have refused home health services at this time.   DISCHARGE CONDITIONS:   Guarded  CONSULTS OBTAINED:   Treatment Team:  Sindy Guadeloupe, MD  DRUG ALLERGIES:   No Known Allergies DISCHARGE MEDICATIONS:   Allergies as of 04/10/2019   No Known Allergies     Medication List    TAKE these medications   acetaminophen 500 MG tablet Commonly known as:  TYLENOL Take 1,000 mg by mouth every 6 (six) hours as needed for moderate pain or headache.   cephALEXin 500 MG capsule Commonly known as:  KEFLEX Take 1 capsule (500 mg total) by mouth 3 (three) times daily for 5 days.   cetirizine 10 MG tablet Commonly known as:  ZYRTEC Take 10  mg by mouth daily as needed for allergies.   Eliquis 5 MG Tabs tablet Generic drug:  apixaban TAKE ONE TABLET BY MOUTH TWICE DAILY What changed:  how much to take   ferrous sulfate 325 (65 FE) MG tablet Take 1 tablet (325 mg total) by  mouth 2 (two) times daily with a meal.   fluconazole 200 MG tablet Commonly known as:  DIFLUCAN Take 1 tablet (200 mg total) by mouth daily for 10 days.   lidocaine-prilocaine cream Commonly known as:  EMLA Apply 1 application topically as needed.   multivitamin with minerals Tabs tablet Take 1 tablet by mouth daily.   Myrbetriq 50 MG Tb24 tablet Generic drug:  mirabegron ER TAKE ONE TABLET EVERY DAY What changed:  how much to take   nystatin powder Commonly known as:  Nyamyc Apply topically 2 (two) times daily.   oxyCODONE 5 MG immediate release tablet Commonly known as:  Oxy IR/ROXICODONE Take 1 tablet (5 mg total) by mouth every 4 (four) hours as needed for severe pain.   polyethylene glycol powder 17 GM/SCOOP powder Commonly known as:  MiraLax Take 17 g by mouth daily as needed. Can increase to 3 times a day as needed for constipation but hold medication if has diarrhea   potassium chloride SA 20 MEQ tablet Commonly known as:  K-DUR Take 20 mEq by mouth 2 (two) times daily.   Super Cranberry/Vitamin D3 4200-500 MG-UNIT Caps Generic drug:  Cranberry-Cholecalciferol Take 1 capsule by mouth 2 (two) times daily.   vitamin B-12 500 MCG tablet Commonly known as:  CYANOCOBALAMIN Take 500 mcg by mouth daily.   zinc oxide 20 % ointment Apply 1 application topically as needed for irritation.        DISCHARGE INSTRUCTIONS:   1.  PCP follow-up in 2 weeks 2.  Oncology follow-up within 1 week 3.  Palliative care recommended  DIET:   Regular diet  ACTIVITY:   Activity as tolerated  OXYGEN:   Home Oxygen: No.  Oxygen Delivery: room air  DISCHARGE LOCATION:   home   If you experience worsening of your admission symptoms, develop shortness of breath, life threatening emergency, suicidal or homicidal thoughts you must seek medical attention immediately by calling 911 or calling your MD immediately  if symptoms less severe.  You Must read complete  instructions/literature along with all the possible adverse reactions/side effects for all the Medicines you take and that have been prescribed to you. Take any new Medicines after you have completely understood and accpet all the possible adverse reactions/side effects.   Please note  You were cared for by a hospitalist during your hospital stay. If you have any questions about your discharge medications or the care you received while you were in the hospital after you are discharged, you can call the unit and asked to speak with the hospitalist on call if the hospitalist that took care of you is not available. Once you are discharged, your primary care physician will handle any further medical issues. Please note that NO REFILLS for any discharge medications will be authorized once you are discharged, as it is imperative that you return to your primary care physician (or establish a relationship with a primary care physician if you do not have one) for your aftercare needs so that they can reassess your need for medications and monitor your lab values.    On the day of Discharge:  VITAL SIGNS:   Blood pressure 111/75, pulse 93, temperature 98.4 F (  36.9 C), temperature source Oral, resp. rate 14, height 5\' 11"  (1.803 m), weight 79.4 kg, SpO2 100 %.  PHYSICAL EXAMINATION:    GENERAL:84 y.o.-year-oldelderlypatient lying in the bed with no acute distress. Hard of hearing EYES: Pupils equal, round, reactive to light and accommodation. No scleral icterus. Extraocular muscles intact.  HEENT: Head atraumatic, normocephalic. Oropharynx and nasopharynx clear.  NECK: Supple, no jugular venous distention. No thyroid enlargement, no tenderness.  LUNGS: Normal breath sounds bilaterally, no wheezing, rales,rhonchi or crepitation. No use of accessory muscles of respiration.Decreased bibasilar breath sounds CARDIOVASCULAR: S1, S2 normal. No rubs, or gallops. 2/6 systolic murmur present ABDOMEN:  Soft, nontender, nondistended. Bowel sounds present. No organomegaly or mass.  Bilateral nephrostomy tubes present EXTREMITIES: No cyanosis, or clubbing. Trace pedal edema noted NEUROLOGIC: Cranial nerves II through XII are intact. Muscle strength 5/5 in all extremities. Sensation intact. Gait not checked.Global weakness noted PSYCHIATRIC: The patient is alert and oriented x3.Some confusion noted.  Tearful today. SKIN: No obvious rash, lesion, or ulcer.  DATA REVIEW:   CBC Recent Labs  Lab 04/09/19 0413  WBC 24.7*  HGB 7.6*  HCT 24.2*  PLT 432*    Chemistries  Recent Labs  Lab 04/08/19 0455 04/09/19 0413  NA 135 136  K 4.1 3.6  CL 105 106  CO2 21* 23  GLUCOSE 136* 90  BUN 28* 25*  CREATININE 1.70* 1.26*  CALCIUM 8.4* 8.2*  AST 13*  --   ALT 14  --   ALKPHOS 80  --   BILITOT 0.3  --      Microbiology Results  Results for orders placed or performed during the hospital encounter of 04/08/19  Urine Culture     Status: Abnormal   Collection Time: 04/08/19  4:55 AM  Result Value Ref Range Status   Specimen Description   Final    URINE, RANDOM Performed at [facility], [address] [place], [place] [address]    Special Requests   Final    NONE Performed at [facility], [address] [place], [place] [address]    Culture MULTIPLE SPECIES PRESENT, SUGGEST RECOLLECTION (A)  Final   Report Status 04/09/2019 FINAL  Final  SARS Coronavirus 2 (CEPHEID - Performed in [place] hospital lab), Hosp Order     Status: None   Collection Time: 04/08/19  7:11 AM  Result Value Ref Range Status   SARS Coronavirus 2 NEGATIVE NEGATIVE Final    Comment: (NOTE) If result is NEGATIVE SARS-CoV-2 target nucleic acids are NOT DETECTED. The SARS-CoV-2 RNA is generally detectable in upper and lower  respiratory specimens during the acute phase of infection. The lowest  concentration of SARS-CoV-2 viral copies this assay can detect is 250  copies / mL. A  negative result does not preclude SARS-CoV-2 infection  and should not be used as the sole basis for treatment or other  patient management decisions.  A negative result may occur with  improper specimen collection / handling, submission of specimen other  than nasopharyngeal swab, presence of viral mutation(s) within the  areas targeted by this assay, and inadequate number of viral copies  (<250 copies / mL). A negative result must be combined with clinical  observations, patient history, and epidemiological information. If result is POSITIVE SARS-CoV-2 target nucleic acids are DETECTED. The SARS-CoV-2 RNA is generally detectable in upper and lower  respiratory specimens dur ing the acute phase of infection.  Positive  results are indicative of active infection with SARS-CoV-2.  Clinical  correlation with patient history and other diagnostic information is  necessary to determine patient infection status.  Positive results do  not rule out bacterial infection or co-infection with other viruses. If result is PRESUMPTIVE POSTIVE SARS-CoV-2 nucleic acids MAY BE PRESENT.   A presumptive positive result was obtained on the submitted specimen  and confirmed on repeat testing.  While 2019 novel coronavirus  (SARS-CoV-2) nucleic acids may be present in the submitted sample  additional confirmatory testing may be necessary for epidemiological  and / or clinical management purposes  to differentiate between  SARS-CoV-2 and other Sarbecovirus currently known to infect humans.  If clinically indicated additional testing with an alternate test  methodology [phone]) is advised. The SARS-CoV-2 RNA is generally  detectable in upper and lower respiratory sp ecimens during the acute  phase of infection. The expected result is Negative. Fact Sheet for Patients:  StrictlyIdeas.no Fact Sheet for Healthcare Providers: BankingDealers.co.za This test is not  yet approved or cleared by the [place] FDA and has been authorized for detection and/or diagnosis of SARS-CoV-2 by FDA under an Emergency Use Authorization (EUA).  This EUA will remain in effect (meaning this test can be used) for the duration of the COVID-19 declaration under Section 564(b)(1) of the Act, 21 U.S.C. section 360bbb-3(b)(1), unless the authorization is terminated or revoked sooner. Performed at [facility], [address], [place], [place] [address]     RADIOLOGY:  No results found.   Management plans discussed with the patient, family and they are in agreement.  CODE STATUS:     Code Status Orders  (From admission, onward)         Start     Ordered   04/08/19 1212  Full code  Continuous     04/08/19 1211        Code Status History    Date Active Date Inactive Code Status Order ID Comments User Context   03/22/2019 2337 03/26/2019 1934 Full Code [id number]  Lance Coon, MD Inpatient   02/21/2019 1505 02/26/2019 1849 Full Code [id number]  Sela Hua, MD Inpatient   11/12/2018 1849 11/16/2018 1632 Full Code [id number]  Epifanio Lesches, MD Inpatient   05/01/2018 1702 05/06/2018 1711 Full Code [id number]  Dustin Flock, MD Inpatient   12/26/2017 2141 12/30/2017 1727 Full Code [id number]  Demetrios Loll, MD Inpatient   05/31/2017 0208 06/01/2017 1922 Full Code [id number]  Harvie Bridge, DO Inpatient   03/04/2017 1833 03/05/2017 1426 Full Code [id number]  Idelle Crouch, MD Inpatient   07/11/2016 0538 07/13/2016 1420 Full Code [id number]  Saundra Shelling, MD ED      TOTAL TIME TAKING CARE OF THIS PATIENT: 38  minutes.    Gladstone Lighter M.D on 04/10/2019 at 3:21 PM  Between 7am to 6pm - Pager - [phone]  After 6pm go to [email or web address] - Proofreader  [facility]  Office  [phone]  CC: Primary care physician; [name], MD   Note: This dictation was prepared with Dragon dictation along with  smaller phrase technology. Any transcriptional errors that result from this process are unintentional.

## 2019-04-10 NOTE — Plan of Care (Signed)

## 2019-04-11 ENCOUNTER — Other Ambulatory Visit: Payer: Medicare Other

## 2019-04-11 ENCOUNTER — Ambulatory Visit: Payer: Medicare Other | Admitting: Oncology

## 2019-04-11 ENCOUNTER — Ambulatory Visit: Payer: Medicare Other

## 2019-04-11 ENCOUNTER — Ambulatory Visit: Payer: Medicare Other | Admitting: Nurse Practitioner

## 2019-04-11 ENCOUNTER — Other Ambulatory Visit: Payer: Self-pay | Admitting: Radiology

## 2019-04-11 DIAGNOSIS — N1339 Other hydronephrosis: Secondary | ICD-10-CM

## 2019-04-16 ENCOUNTER — Other Ambulatory Visit: Payer: Self-pay | Admitting: Radiology

## 2019-04-16 DIAGNOSIS — Z01818 Encounter for other preprocedural examination: Secondary | ICD-10-CM

## 2019-04-17 ENCOUNTER — Other Ambulatory Visit: Payer: Self-pay

## 2019-04-17 ENCOUNTER — Other Ambulatory Visit: Admission: RE | Admit: 2019-04-17 | Payer: Medicare Other | Source: Ambulatory Visit

## 2019-04-17 ENCOUNTER — Inpatient Hospital Stay: Payer: Medicare Other | Attending: Oncology

## 2019-04-17 VITALS — BP 114/66 | HR 96 | Temp 96.4°F | Resp 18

## 2019-04-17 DIAGNOSIS — F419 Anxiety disorder, unspecified: Secondary | ICD-10-CM | POA: Insufficient documentation

## 2019-04-17 DIAGNOSIS — Z5112 Encounter for antineoplastic immunotherapy: Secondary | ICD-10-CM | POA: Diagnosis not present

## 2019-04-17 DIAGNOSIS — R7989 Other specified abnormal findings of blood chemistry: Secondary | ICD-10-CM

## 2019-04-17 DIAGNOSIS — D649 Anemia, unspecified: Secondary | ICD-10-CM | POA: Insufficient documentation

## 2019-04-17 DIAGNOSIS — C689 Malignant neoplasm of urinary organ, unspecified: Secondary | ICD-10-CM

## 2019-04-17 DIAGNOSIS — Z7901 Long term (current) use of anticoagulants: Secondary | ICD-10-CM | POA: Insufficient documentation

## 2019-04-17 DIAGNOSIS — Z8546 Personal history of malignant neoplasm of prostate: Secondary | ICD-10-CM | POA: Diagnosis not present

## 2019-04-17 DIAGNOSIS — Z9049 Acquired absence of other specified parts of digestive tract: Secondary | ICD-10-CM | POA: Insufficient documentation

## 2019-04-17 DIAGNOSIS — C679 Malignant neoplasm of bladder, unspecified: Secondary | ICD-10-CM | POA: Insufficient documentation

## 2019-04-17 DIAGNOSIS — I10 Essential (primary) hypertension: Secondary | ICD-10-CM | POA: Diagnosis not present

## 2019-04-17 DIAGNOSIS — C61 Malignant neoplasm of prostate: Secondary | ICD-10-CM

## 2019-04-17 DIAGNOSIS — Z79899 Other long term (current) drug therapy: Secondary | ICD-10-CM | POA: Diagnosis not present

## 2019-04-17 DIAGNOSIS — E860 Dehydration: Secondary | ICD-10-CM | POA: Insufficient documentation

## 2019-04-17 MED ORDER — SODIUM CHLORIDE 0.9% FLUSH
10.0000 mL | INTRAVENOUS | Status: DC | PRN
  Administered 2019-04-17: 10:00:00 10 mL via INTRAVENOUS
  Filled 2019-04-17: qty 10

## 2019-04-17 MED ORDER — HEPARIN SOD (PORK) LOCK FLUSH 100 UNIT/ML IV SOLN
500.0000 [IU] | Freq: Once | INTRAVENOUS | Status: AC
  Administered 2019-04-17: 500 [IU] via INTRAVENOUS

## 2019-04-17 MED ORDER — SODIUM CHLORIDE 0.9 % IV SOLN
Freq: Once | INTRAVENOUS | Status: AC
  Administered 2019-04-17: 10:00:00 via INTRAVENOUS
  Filled 2019-04-17: qty 250

## 2019-04-18 ENCOUNTER — Telehealth: Payer: Self-pay

## 2019-04-18 NOTE — Telephone Encounter (Signed)
Patient info on nephrostomy tube care requested from IR will be sent to patient

## 2019-04-18 NOTE — Discharge Instructions (Signed)
Percutaneous Nephrostomy, Care After  This sheet gives you information about how to care for yourself after your procedure. Your health care provider may also give you more specific instructions. If you have problems or questions, contact your health care provider.  What can I expect after the procedure?  After the procedure, it is common to have:  Some soreness where the nephrostomy tube was inserted (tube insertion site).  Blood-tinged drainage from the nephrostomy tube for the first 24 hours.  Follow these instructions at home:  Activity  Return to your normal activities as told by your health care provider. Ask your health care provider what activities are safe for you.  Avoid activities that may cause the nephrostomy tubing to bend.  Do not take baths, swim, or use a hot tub until your health care provider approves. Ask your health care provider if you can take showers. Cover the nephrostomy tube dressing with a watertight covering when you take a shower.  Do not drive for 24 hours if you were given a medicine to help you relax (sedative).  Care of the tube insertion site    Follow instructions from your health care provider about how to take care of your tube insertion site. Make sure you:  Wash your hands with soap and water before you change your bandage (dressing). If soap and water are not available, use hand sanitizer.  Change your dressing as told by your health care provider. Be careful not to pull on the tube while removing the dressing.  When you change the dressing, wash the skin around the tube, rinse well, and pat the skin dry.  Check the tube insertion area every day for signs of infection. Check for:  More redness, swelling, or pain.  More fluid or blood.  Warmth.  Pus or a bad smell.  Care of the nephrostomy tube and drainage bag  Always keep the tubing, the leg bag, or the bedside drainage bags below the level of the kidney so that your urine drains freely.  When connecting your nephrostomy  tube to a drainage bag, make sure that there are no kinks in the tubing and that your urine is draining freely. You may want to use an elastic bandage to wrap any exposed tubing that goes from the nephrostomy tube to any of the connecting tubes.  At night, you may want to connect your nephrostomy tube or the leg bag to a larger bedside drainage bag.  Follow instructions from your health care provider about how to empty or change the drainage bag.  Empty the drainage bag when it becomes ? full.  Replace the drainage bag and any extension tubing that is connected to your nephrostomy tube every 3 weeks or as often as told by your health care provider. Your health care provider will explain how to change the drainage bag and extension tubing.  General instructions  Take over-the-counter and prescription medicines only as told by your health care provider.  Keep all follow-up visits as told by your health care provider. This is important.  Contact a health care provider if:  You have problems with any of the valves or tubing.  You have persistent pain or soreness in your back.  You have more redness, swelling, or pain around your tube insertion site.  You have more fluid or blood coming from your tube insertion site.  Your tube insertion site feels warm to the touch.  You have pus or a bad smell coming from your tube   insertion site.  You have increased urine output or you feel burning when urinating.  Get help right away if:  You have pain in your abdomen during the first week.  You have chest pain or have trouble breathing.  You have a new appearance of blood in your urine.  You have a fever or chills.  You have back pain that is not relieved by your medicine.  You have decreased urine output.  Your nephrostomy tube comes out.  This information is not intended to replace advice given to you by your health care provider. Make sure you discuss any questions you have with your health care provider.  Document Released:  06/16/2004 Document Revised: 08/05/2016 Document Reviewed: 08/05/2016  Elsevier Interactive Patient Education © 2019 [employer]

## 2019-04-19 ENCOUNTER — Other Ambulatory Visit: Payer: Self-pay

## 2019-04-19 ENCOUNTER — Ambulatory Visit: Payer: Medicare Other

## 2019-04-19 ENCOUNTER — Ambulatory Visit
Admission: RE | Admit: 2019-04-19 | Discharge: 2019-04-19 | Disposition: A | Discharge status: Home / Self Care | Payer: Medicare Other | Source: Ambulatory Visit | Attending: Urology | Admitting: Urology

## 2019-04-19 ENCOUNTER — Encounter: Payer: Self-pay | Admitting: Interventional Radiology

## 2019-04-19 DIAGNOSIS — N1339 Other hydronephrosis: Secondary | ICD-10-CM | POA: Diagnosis not present

## 2019-04-19 DIAGNOSIS — Z436 Encounter for attention to other artificial openings of urinary tract: Secondary | ICD-10-CM | POA: Insufficient documentation

## 2019-04-19 HISTORY — PX: IR NEPHROSTOMY EXCHANGE RIGHT: IMG6070

## 2019-04-19 HISTORY — PX: IR NEPHROSTOMY EXCHANGE LEFT: IMG6069

## 2019-04-19 MED ORDER — IODIXANOL 320 MG/ML IV SOLN
50.0000 mL | Freq: Once | INTRAVENOUS | Status: AC | PRN
  Administered 2019-04-19: 15 mL

## 2019-04-19 NOTE — Progress Notes (Signed)
Patient clinically stable post nephrostomy exchange, tolerated well. Vitals stable. Discharge instructions given to wife over phone with questions answered.

## 2019-04-23 ENCOUNTER — Encounter: Payer: Self-pay | Admitting: Oncology

## 2019-04-23 ENCOUNTER — Inpatient Hospital Stay (HOSPITAL_BASED_OUTPATIENT_CLINIC_OR_DEPARTMENT_OTHER): Payer: Medicare Other | Admitting: Oncology

## 2019-04-23 ENCOUNTER — Other Ambulatory Visit: Payer: Self-pay

## 2019-04-23 ENCOUNTER — Inpatient Hospital Stay: Payer: Medicare Other

## 2019-04-23 VITALS — BP 112/60 | HR 112 | Temp 97.4°F | Ht 71.0 in

## 2019-04-23 DIAGNOSIS — D649 Anemia, unspecified: Secondary | ICD-10-CM

## 2019-04-23 DIAGNOSIS — C679 Malignant neoplasm of bladder, unspecified: Secondary | ICD-10-CM | POA: Diagnosis not present

## 2019-04-23 DIAGNOSIS — Z8546 Personal history of malignant neoplasm of prostate: Secondary | ICD-10-CM

## 2019-04-23 DIAGNOSIS — D72829 Elevated white blood cell count, unspecified: Secondary | ICD-10-CM

## 2019-04-23 DIAGNOSIS — T83512A Infection and inflammatory reaction due to nephrostomy catheter, initial encounter: Secondary | ICD-10-CM | POA: Diagnosis not present

## 2019-04-23 DIAGNOSIS — Z9049 Acquired absence of other specified parts of digestive tract: Secondary | ICD-10-CM

## 2019-04-23 DIAGNOSIS — Z7901 Long term (current) use of anticoagulants: Secondary | ICD-10-CM

## 2019-04-23 DIAGNOSIS — E860 Dehydration: Secondary | ICD-10-CM | POA: Diagnosis not present

## 2019-04-23 DIAGNOSIS — Z79899 Other long term (current) drug therapy: Secondary | ICD-10-CM

## 2019-04-23 DIAGNOSIS — I10 Essential (primary) hypertension: Secondary | ICD-10-CM

## 2019-04-23 DIAGNOSIS — C689 Malignant neoplasm of urinary organ, unspecified: Secondary | ICD-10-CM

## 2019-04-23 DIAGNOSIS — F419 Anxiety disorder, unspecified: Secondary | ICD-10-CM | POA: Diagnosis not present

## 2019-04-23 DIAGNOSIS — R7989 Other specified abnormal findings of blood chemistry: Secondary | ICD-10-CM

## 2019-04-23 LAB — BASIC METABOLIC PANEL WITH GFR
Anion gap: 13 (ref 5–15)
BUN: 23 mg/dL (ref 8–23)
CO2: 22 mmol/L (ref 22–32)
Calcium: 8.6 mg/dL — ABNORMAL LOW (ref 8.9–10.3)
Chloride: 98 mmol/L (ref 98–111)
Creatinine, Ser: 1.23 mg/dL (ref 0.61–1.24)
GFR calc Af Amer: >60 mL/min
GFR calc non Af Amer: 55 mL/min — ABNORMAL LOW
Glucose, Bld: 140 mg/dL — ABNORMAL HIGH (ref 70–99)
Potassium: 4.2 mmol/L (ref 3.5–5.1)
Sodium: 133 mmol/L — ABNORMAL LOW (ref 135–145)

## 2019-04-23 LAB — CBC WITH DIFFERENTIAL/PLATELET
Abs Immature Granulocytes: 0.31 10*3/uL — ABNORMAL HIGH (ref 0.00–0.07)
Basophils Absolute: 0.1 10*3/uL (ref 0.0–0.1)
Basophils Relative: 0 %
Eosinophils Absolute: 0.1 10*3/uL (ref 0.0–0.5)
Eosinophils Relative: 0 %
HCT: 27.8 % — ABNORMAL LOW (ref 39.0–52.0)
Hemoglobin: 8.8 g/dL — ABNORMAL LOW (ref 13.0–17.0)
Immature Granulocytes: 1 %
Lymphocytes Relative: 4 %
Lymphs Abs: 1.2 10*3/uL (ref 0.7–4.0)
MCH: 29.7 pg (ref 26.0–34.0)
MCHC: 31.7 g/dL (ref 30.0–36.0)
MCV: 93.9 fL (ref 80.0–100.0)
Monocytes Absolute: 1.3 10*3/uL — ABNORMAL HIGH (ref 0.1–1.0)
Monocytes Relative: 5 %
Neutro Abs: 23.7 10*3/uL — ABNORMAL HIGH (ref 1.7–7.7)
Neutrophils Relative %: 90 %
Platelets: 377 10*3/uL (ref 150–400)
RBC: 2.96 MIL/uL — ABNORMAL LOW (ref 4.22–5.81)
RDW: 15.9 % — ABNORMAL HIGH (ref 11.5–15.5)
WBC: 26.7 10*3/uL — ABNORMAL HIGH (ref 4.0–10.5)
nRBC: 0 % (ref 0.0–0.2)

## 2019-04-23 MED ORDER — MORPHINE SULFATE 2 MG/ML IJ SOLN
4.0000 mg | Freq: Once | INTRAMUSCULAR | Status: AC
  Administered 2019-04-23: 10:00:00 4 mg via INTRAVENOUS
  Filled 2019-04-23: qty 2

## 2019-04-23 MED ORDER — SODIUM CHLORIDE 0.9% FLUSH
10.0000 mL | INTRAVENOUS | Status: DC | PRN
  Administered 2019-04-23: 10 mL via INTRAVENOUS
  Filled 2019-04-23: qty 10

## 2019-04-23 MED ORDER — HEPARIN SOD (PORK) LOCK FLUSH 100 UNIT/ML IV SOLN
500.0000 [IU] | Freq: Once | INTRAVENOUS | Status: AC
  Administered 2019-04-23: 500 [IU] via INTRAVENOUS
  Filled 2019-04-23: qty 5

## 2019-04-23 MED ORDER — SODIUM CHLORIDE 0.9 % IV SOLN
Freq: Once | INTRAVENOUS | Status: AC
  Administered 2019-04-23: 1000 mL via INTRAVENOUS
  Filled 2019-04-23: qty 250

## 2019-04-23 NOTE — Progress Notes (Signed)
Patient here for IVF. Pt very uncomfortable, complaining of severe right leg pain. Per Dr [name] may give 4mg  Morphine IV. Orders entered

## 2019-04-23 NOTE — Progress Notes (Signed)
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 For his prostate cancer he has received IM RT in the past. He has been seeing Dr. Erlene Quan in the past for his recurrent UTIs as well as hematuria. CT scan in July 2018 showed bladder wall thickening with perivascular edema and inflammation in the right ureter greater than the left. Findings were thought to be due to pyelonephritis at that time. He underwent cystoscopy on 12/14/2017 which showed abnormal looking prostate with necrotic material lining the entire surface area. Diffuse copious debris within the bladder appearing to be erythematous without discrete bladder tumor but visualization was poor. He was then admitted to the hospital on 12/26/2017 with symptoms of UTI and sepsis as well as new moderate bilateral hydronephrosis.  CT abdomen and pelvis with contrast on 12/20/2017 again showed market bile bladder wall thickening with perivesicular edema. Within the lumen of the bladder there is a 93.9 cm soft tissue attenuating filling defect. This is indeterminate and could represent an area of blood clot versus urothelial lesion.  He underwent repeat cystoscopy with bilateral pyelogram and ureteral stent placement as well as TURBT and TURP. He was found to have a massive tumor involving the majority of the bladder with grossly necrotic and calcified material. There appeared to  be multifocal disease with large burden of the left anterior bladder wall extending posteriorly as well as adjacent to the bladder neck and beyond the right hemitrigone. Very little normal recognizable bladder mucosa remaining.   Biopsy from TURBT and TURP showed: High-grade urothelial carcinoma with extensive necrosisinvolving both the bladder and the prostate.CT chest did not reveal any evidence of metastatic disease. He was also not found to have any regional adenopathy on CT abdomen  Patient was seen by Dr. Erlene Quan and was not deemed to be a surgical candidate. He has been referred to [place] for definitive treatment options.  Patient lives with his wife at home and ambulates with a cane. He does need assistance with his ADLs to some extent. He has not had any falls and he denies any changes in his appetite or unintentional weight loss. Denies any pain. Reports some fatigue and occasional problems with constipation. He has had 3 hospitalizations last year for urinary tract infections and currently has a chronic Foley for the last 1 month  Dr. Erlene Quan performed interval TURBT on 01/15/18 and was able to debulk tumor as much as possible to reduce tumor burden  Patient received 5 cycles ofcarboplatin/ gemcitabine 2 weeks on and 1 week off ending 04/26/18.Patient did receive radiation for 10 days during cycle 2 of treatment. Given patients age, co-morbidities and frailty- he was not a cisplatin candidate.6th cycle not given due to fall and hip fracture  Patient had a repeat cystoscopy in September 2019 which showed no obvious tumor bladder but it did have a shaggy necrotic appearance at the bladder neck. Prostatic fossa was grossly abnormal necrotic and irregular without papillary change. Bladder neck was biopsied and showed residual high-grade urothelial  carcinoma. Muscularis propria was not seen in that specimen.Due to evidence of recurrent/residual disease patient was started on  Tecentriq on 07/26/2018  Interval history-patient reports feeling shaky today I spoke to the patient's wife over the phone who tells me that he has had some good days and bad days over the last 2 weeks.  He is eating well and there are times when he is able to get up and walk a few steps without a walker.  He has not had any fevers or altered mental status at home.  ECOG PS- 2-3 Pain scale- 0 Opioid associated constipation- no  Review of systems- Review of Systems  Constitutional: Negative for chills, fever, malaise/fatigue and weight loss.  HENT: Negative for congestion, ear discharge and nosebleeds.   Eyes: Negative for blurred vision.  Respiratory: Negative for cough, hemoptysis, sputum production, shortness of breath and wheezing.   Cardiovascular: Negative for chest pain, palpitations, orthopnea and claudication.  Gastrointestinal: Negative for abdominal pain, blood in stool, constipation, diarrhea, heartburn, melena, nausea and vomiting.  Genitourinary: Negative for dysuria, flank pain, frequency, hematuria and urgency.  Musculoskeletal: Negative for back pain, joint pain and myalgias.  Skin: Negative for rash.  Neurological: Negative for dizziness, tingling, focal weakness, seizures, weakness and headaches.  Endo/Heme/Allergies: Does not bruise/bleed easily.  Psychiatric/Behavioral: Negative for depression and suicidal ideas. The patient does not have insomnia.       No Known Allergies   Past Medical History:  Diagnosis Date   Anxiety    Arthritis    Bladder cancer ([place])    Dysrhythmia 07/2018   history of atrial flutter that worsens with anxiety   Femur fracture, right ([place]) 05/01/2018   GERD (gastroesophageal reflux disease)    History of recent blood transfusion 05/2018   Hypertension    Iron deficiency anemia 09/14/2018   Prostate cancer ([place]) 07/2018   cancer growing in prostate but not prostate cancer, it is from the bladder   Umbilical hernia 60/4540     Urinary retention 2019   foley catheter place 11/2017   UTI (urinary tract infection) 2019   frequent UTI's over last year   Wound eschar of foot 07/2018   left heal getting wrapped and requiring antibiotic cream. cracks open with weight bearing.     Past Surgical History:  Procedure Laterality Date   CARPAL TUNNEL RELEASE Right    CHOLECYSTECTOMY  2004   CYSTOGRAM  07/18/2018   Procedure: CYSTOGRAM;  Surgeon: [name], MD;  Location: [facility];  Service: Urology;;   CYSTOSCOPY W/ RETROGRADES Bilateral 07/18/2018   Procedure: CYSTOSCOPY WITH RETROGRADE PYELOGRAM;  Surgeon: [name], MD;  Location: [facility];  Service: Urology;  Laterality: Bilateral;   CYSTOSCOPY W/ URETERAL STENT PLACEMENT Bilateral 12/27/2017   Procedure: CYSTOSCOPY WITH RETROGRADE PYELOGRAM/URETERAL STENT PLACEMENT;  Surgeon: [name], MD;  Location: [facility];  Service: Urology;  Laterality: Bilateral;   CYSTOSCOPY W/ URETERAL STENT PLACEMENT Bilateral 07/18/2018   Procedure: CYSTOSCOPY WITH STENT REPLACEMENT (exchange);  Surgeon: [name], MD;  Location: [facility];  Service: Urology;  Laterality: Bilateral;   CYSTOSCOPY WITH STENT PLACEMENT Bilateral 11/12/2018   Procedure: [place] WITH STENT Exchange;  Surgeon: [name], MD;  Location: [facility];  Service: Urology;  Laterality: Bilateral;   INTRAMEDULLARY (IM) NAIL INTERTROCHANTERIC Right 05/02/2018   Procedure: INTRAMEDULLARY (IM) NAIL INTERTROCHANTRIC;  Surgeon: [name], MD;  Location: [facility];  Service: Orthopedics;  Laterality: Right;   IR NEPHROSTOMY EXCHANGE LEFT  03/25/2019   IR NEPHROSTOMY EXCHANGE LEFT  04/19/2019   IR NEPHROSTOMY EXCHANGE RIGHT  03/25/2019   IR NEPHROSTOMY EXCHANGE RIGHT  04/19/2019   IR NEPHROSTOMY PLACEMENT LEFT  02/23/2019   IR NEPHROSTOMY PLACEMENT RIGHT  02/23/2019   LEG TENDON SURGERY Right 1958   PORTA CATH INSERTION N/A 01/22/2018   Procedure: PORTA CATH INSERTION;  Surgeon:  [name], MD;  Location: [facility];  Service: Cardiovascular;  Laterality: N/A;   TRANSURETHRAL RESECTION OF BLADDER TUMOR N/A 12/27/2017   Procedure: TRANSURETHRAL RESECTION OF BLADDER TUMOR (TURBT);  Surgeon: [name], MD;  Location: [facility];  Service: Urology;  Laterality: N/A;   TRANSURETHRAL RESECTION OF BLADDER TUMOR N/A 01/15/2018   Procedure: TRANSURETHRAL RESECTION OF BLADDER TUMOR (TURBT);  Surgeon: [name], MD;  Location: [facility];  Service: Urology;  Laterality: N/A;  Need 2 hrs for this case please   TRANSURETHRAL RESECTION OF BLADDER TUMOR N/A 07/18/2018   Procedure: TRANSURETHRAL RESECTION OF BLADDER TUMOR (TURBT);  Surgeon: [name], MD;  Location: [facility];  Service: Urology;  Laterality: N/A;    Social History   Socioeconomic History   Marital status: Married    Spouse name: ruth   Number of children: Not on file   Years of education: Not on file   Highest education level: Not on file  Occupational History   Occupation: retired    Comment: Therapist, nutritional [place] resource strain: Not on file   Food insecurity    Worry: Not on file    Inability: Not on file   Transportation needs    Medical: Not on file    Non-medical: Not on file  Tobacco Use   Smoking status: Never Smoker   Smokeless tobacco: Never Used  Substance and Sexual Activity   Alcohol use: No    Alcohol/week: 0.0 standard drinks   Drug use: No   Sexual activity: Not Currently  Lifestyle   Physical activity    Days per week: Not on file    Minutes per session: Not on file   Stress: Not on file  Relationships   Social connections    Talks on phone: Not on file    Gets together: Not on file    Attends religious service: Not on file    Active member of club or organization: Not on file    Attends meetings of clubs or organizations: Not on file    Relationship status: Not on file   Intimate partner violence    Fear of  current or ex partner: Not on file    Emotionally abused: Not on file    Physically abused: Not on file    Forced sexual activity: Not on file  Other Topics Concern   Not on file  Social History Narrative   Not on file    Family History  Problem Relation Age of Onset   Cancer Mother    Chronic Renal Failure Mother    Heart disease Father      Current Outpatient Medications:    acetaminophen (TYLENOL) 500 MG tablet, Take 1,000 mg by mouth every 6 (six) hours as needed for moderate pain or headache. , Disp: , Rfl:    cetirizine (ZYRTEC) 10 MG tablet, Take 10 mg by mouth daily as needed for allergies. , Disp: , Rfl:    Cranberry-Cholecalciferol (SUPER CRANBERRY/VITAMIN D3) 4200-500 MG-UNIT CAPS, Take 1 capsule by mouth 2 (two) times daily., Disp: , Rfl:    ELIQUIS 5 MG TABS tablet, TAKE ONE  TABLET BY MOUTH TWICE DAILY (Patient taking differently: Take 5 mg by mouth 2 (two) times daily. ), Disp: 60 tablet, Rfl: 3   ferrous sulfate 325 (65 FE) MG tablet, Take 1 tablet (325 mg total) by mouth 2 (two) times daily with a meal., Disp: , Rfl: 3   lidocaine-prilocaine (EMLA) cream, Apply 1 application topically as needed., Disp: 30 g, Rfl: 1   Multiple Vitamin (MULTIVITAMIN WITH MINERALS) TABS tablet, Take 1 tablet by mouth daily., Disp: , Rfl:    MYRBETRIQ 50 MG TB24 tablet, TAKE ONE TABLET EVERY DAY (Patient taking differently: Take 50 mg by mouth daily. ), Disp: 30 tablet, Rfl: 3   nystatin (NYAMYC) powder, Apply topically 2 (two) times daily., Disp: 60 g, Rfl: 3   oxyCODONE (OXY IR/ROXICODONE) 5 MG immediate release tablet, Take 1 tablet (5 mg total) by mouth every 4 (four) hours as needed for severe pain., Disp: 60 tablet, Rfl: 0   polyethylene glycol powder (MIRALAX) powder, Take 17 g by mouth daily as needed. Can increase to 3 times a day as needed for constipation but hold medication if has diarrhea, Disp: 255 g, Rfl: 0   potassium chloride SA (K-DUR) 20 MEQ tablet, Take  20 mEq by mouth 2 (two) times daily., Disp: , Rfl:    vitamin B-12 (CYANOCOBALAMIN) 500 MCG tablet, Take 500 mcg by mouth daily., Disp: , Rfl:    zinc oxide 20 % ointment, Apply 1 application topically as needed for irritation., Disp: , Rfl:   Physical exam:  Vitals:   04/23/19 0838  BP: 112/60  Pulse: (!) 112  Temp: (!) 97.4 F (36.3 C)  TempSrc: Tympanic  Height: 5\' 11"  (1.803 m)   Physical Exam HENT:     Head: Normocephalic and atraumatic.  Eyes:     Pupils: Pupils are equal, round, and reactive to light.  Neck:     Musculoskeletal: Normal range of motion.  Cardiovascular:     Rate and Rhythm: Regular rhythm. Tachycardia present.     Heart sounds: Normal heart sounds.  Pulmonary:     Effort: Pulmonary effort is normal.     Breath sounds: Normal breath sounds.     Comments: Bilateral nephrostomy tubes in place Abdominal:     General: Bowel sounds are normal.     Palpations: Abdomen is soft.  Skin:    General: Skin is warm and dry.  Neurological:     Mental Status: He is alert and oriented to person, place, and time.      CMP Latest Ref Rng & Units 04/09/2019  Glucose 70 - 99 mg/dL 90  BUN 8 - 23 mg/dL 25(H)  Creatinine 0.61 - 1.24 mg/dL 1.26(H)  Sodium 135 - 145 mmol/L 136  Potassium 3.5 - 5.1 mmol/L 3.6  Chloride 98 - 111 mmol/L 106  CO2 22 - 32 mmol/L 23  Calcium 8.9 - 10.3 mg/dL 8.2(L)  Total Protein 6.5 - 8.1 g/dL -  Total Bilirubin 0.3 - 1.2 mg/dL -  Alkaline Phos 38 - 126 U/L -  AST 15 - 41 U/L -  ALT 0 - 44 U/L -   CBC Latest Ref Rng & Units 04/09/2019  WBC 4.0 - 10.5 K/uL 24.7(H)  Hemoglobin 13.0 - 17.0 g/dL 7.6(L)  Hematocrit 39.0 - 52.0 % 24.2(L)  Platelets 150 - 400 K/uL 432(H)    No images are attached to the encounter.  Ct Renal Stone Study  Result Date: 04/08/2019 CLINICAL DATA:  84 year old male with lack of nephrostomy tube  drainage for 24-36 hours with flank pain. Bladder carcinoma. EXAM: CT ABDOMEN AND PELVIS WITHOUT CONTRAST TECHNIQUE:  Multidetector CT imaging of the abdomen and pelvis was performed following the standard protocol without IV contrast. COMPARISON:  CT Abdomen and Pelvis 03/22/2019 and earlier. FINDINGS: Lower chest: Small volume pericardial effusion is new since last month with simple fluid density. Small layering right pleural effusion is also new. No cardiomegaly. No new lung base opacity. Hepatobiliary: Surgically absent gallbladder. Negative noncontrast liver. Pancreas: Stable, atrophied. Spleen: Negative. Adrenals/Urinary Tract: Stable adrenal glands. Bilateral nephrostomy tubes. The left kidney is decompressed now compared to 03/22/2019. There is new right hydronephrosis. Mild right pararenal stranding is stable. No discontinuity or kinking of the right nephrostomy tube is identified. New right hydroureter. Partially necrotic urinary bladder tumor redemonstrated and not significantly changed. There are distal small bowel loops inseparable from the abnormal urinary bladder. Stomach/Bowel: Increased retained stool throughout the large bowel with 6 centimeter stool ball in the rectum. Redundant sigmoid. Redundant transverse colon. No large bowel wall thickening. Appendix remains within normal limits. No dilated small bowel. Decompressed stomach. No free air. No free fluid identified. Vascular/Lymphatic: Vascular patency is not evaluated in the absence of IV contrast. Extensive Aortoiliac calcified atherosclerosis. Reproductive: Stable. Other: No pelvic free fluid. Musculoskeletal: Stable visualized osseous structures. IMPRESSION: 1. New right hydronephrosis and hydroureter since 03/22/19, but right nephrostomy tube remains in place with no adverse features. The tube may be clogged. 2. New small pericardial and layering right pleural effusions. 3. The left kidney is decompressed since the prior with left nephrostomy tube in place. 4. Partially necrotic urinary bladder tumor not significantly changed. Note that this is inseparable  from some small bowel loops. 5. Increased retained stool throughout the large bowel with stool ball in the rectum suspicious for fecal impaction. Electronically Signed   By: Genevie Ann M.D.   On: 04/08/2019 05:56   Ir Nephrostomy Exchange Left  Result Date: 04/19/2019 INDICATION: 84 year old male with bladder cancer and chronic indwelling bilateral percutaneous nephrostomy tubes. His last tube exchange was performed on 03/25/2019. He presents today, only 3 weeks later for tube exchange. EXAM: Bilateral nephrostomy tube exchange COMPARISON:  Prior tube exchange 03/25/2019 MEDICATIONS: None ANESTHESIA/SEDATION: None CONTRAST:  15 mL Isovue-300-administered into the collecting system(s) FLUOROSCOPY TIME:  Fluoroscopy Time: 0 minutes 48 seconds (14.2 mGy). COMPLICATIONS: None immediate. PROCEDURE: Informed written consent was obtained from the patient after a thorough discussion of the procedural risks, benefits and alternatives. All questions were addressed. Maximal Sterile Barrier Technique was utilized including caps, mask, sterile gowns, sterile gloves, sterile drape, hand hygiene and skin antiseptic. A timeout was performed prior to the initiation of the procedure. Attention was first turned to the left percutaneous nephrostomy tube. The tube was imaged fluoroscopically. The tube is well formed. A gentle hand injection of contrast material confirms that the tube is within the renal pelvis. There is no evidence of hydronephrosis. The tube was cut and removed over a 75 cm Amplatz wire. The tube was mildly stained and encrusted despite having only been in place for less than 1 month. Therefore, the decision was made to upsized to a 12 French drainage catheter. A Cook 12 [place] all-purpose drainage catheter was then advanced over the wire and formed in the renal pelvis. A gentle hand injection of contrast material confirms the tube is again within the renal pelvis. The tube was flushed and connected to gravity bag  drainage. Images were obtained and stored for the medical record. Attention was  next turned to the right percutaneous nephrostomy tube. Using the exact same technique, the tube was confirmed to be well-positioned within the renal pelvis and then successfully exchanged for a new Cook 12 [place] all-purpose drainage catheter. Images were again stored for the medical record. IMPRESSION: Successful up size and exchange of bilateral percutaneous nephrostomy tubes. The new tubes are 34 [place] in an effort to prevent clogging. PLAN: Return for bilateral nephrostomy tube exchange in 6 weeks. Signed, Criselda Peaches, MD, [place] [facility] Radiology Electronically Signed   By: Jacqulynn Cadet M.D.   On: 04/19/2019 10:52   Ir Nephrostomy Exchange Left  Result Date: 03/25/2019 INDICATION: 84 year old male with a history of nonfunctional percutaneous nephrostomy EXAM: IR EXCHANGE NEPHROSTOMY LEFT; IR EXCHANGE NEPHROSTOMY RIGHT COMPARISON:  None. MEDICATIONS: None ANESTHESIA/SEDATION: None CONTRAST:  41mL OMNIPAQUE IOHEXOL 300 MG/ML SOLN - administered into the collecting system(s) FLUOROSCOPY TIME:  Fluoroscopy Time: 1 minutes 6 seconds (16.8 mGy). COMPLICATIONS: None PROCEDURE: Informed written consent was obtained from the patient after a thorough discussion of the procedural risks, benefits and alternatives. All questions were addressed. Maximal Sterile Barrier Technique was utilized including caps, mask, sterile gowns, sterile gloves, sterile drape, hand hygiene and skin antiseptic. A timeout was performed prior to the initiation of the procedure. Patient positioned in the supine position on the fluoroscopy table. The patient was prepped and draped in the usual sterile fashion. Left: 1% lidocaine was used for local anesthesia. Small amount of contrast was infused confirming location in the collecting system. Modified Seldinger technique was then used to exchange for a  new 10 [place] percutaneous nephrostomy. Catheter was formed in the collecting system and contrast confirmed location. Catheter was sutured in location and patch to gravity drainage. Right: 1% lidocaine was used for local anesthesia. Small amount of contrast was infused confirming location in the collecting system. Modified Seldinger technique was then used to exchange for a new 10 [place] percutaneous nephrostomy. Catheter was formed in the collecting system and contrast confirmed location. Catheter was sutured in location and patch to gravity drainage. Patient tolerated the procedure well and remained hemodynamically stable throughout. No complications were encountered and no significant blood loss. IMPRESSION: Status post exchange of bilateral percutaneous nephrostomy. Signed, Dulcy Fanny. Dellia Nims, RPVI [facility] Radiology Electronically Signed   By: Corrie Mckusick D.O.   On: 03/25/2019 14:51   Ir Nephrostomy Exchange Right  Result Date: 04/19/2019 INDICATION: 84 year old male with bladder cancer and chronic indwelling bilateral percutaneous nephrostomy tubes. His last tube exchange was performed on 03/25/2019. He presents today, only 3 weeks later for tube exchange. EXAM: Bilateral nephrostomy tube exchange COMPARISON:  Prior tube exchange 03/25/2019 MEDICATIONS: None ANESTHESIA/SEDATION: None CONTRAST:  15 mL Isovue-300-administered into the collecting system(s) FLUOROSCOPY TIME:  Fluoroscopy Time: 0 minutes 48 seconds (14.2 mGy). COMPLICATIONS: None immediate. PROCEDURE: Informed written consent was obtained from the patient after a thorough discussion of the procedural risks, benefits and alternatives. All questions were addressed. Maximal Sterile Barrier Technique was utilized including caps, mask, sterile gowns, sterile gloves, sterile drape, hand hygiene and skin antiseptic. A timeout was performed prior to the initiation of the procedure. Attention was  first turned to the left percutaneous nephrostomy tube. The tube was imaged fluoroscopically. The tube is well formed. A gentle hand injection of contrast material confirms that the tube is within the renal pelvis. There is no evidence of hydronephrosis. The tube was cut and removed over a 75 cm Amplatz wire.  The tube was mildly stained and encrusted despite having only been in place for less than 1 month. Therefore, the decision was made to upsized to a 12 French drainage catheter. A Cook 12 [place] all-purpose drainage catheter was then advanced over the wire and formed in the renal pelvis. A gentle hand injection of contrast material confirms the tube is again within the renal pelvis. The tube was flushed and connected to gravity bag drainage. Images were obtained and stored for the medical record. Attention was next turned to the right percutaneous nephrostomy tube. Using the exact same technique, the tube was confirmed to be well-positioned within the renal pelvis and then successfully exchanged for a new Cook 12 [place] all-purpose drainage catheter. Images were again stored for the medical record. IMPRESSION: Successful up size and exchange of bilateral percutaneous nephrostomy tubes. The new tubes are 9 [place] in an effort to prevent clogging. PLAN: Return for bilateral nephrostomy tube exchange in 6 weeks. Signed, Criselda Peaches, MD, [place] [facility] Radiology Electronically Signed   By: Jacqulynn Cadet M.D.   On: 04/19/2019 10:52   Ir Nephrostomy Exchange Right  Result Date: 03/25/2019 INDICATION: 84 year old male with a history of nonfunctional percutaneous nephrostomy EXAM: IR EXCHANGE NEPHROSTOMY LEFT; IR EXCHANGE NEPHROSTOMY RIGHT COMPARISON:  None. MEDICATIONS: None ANESTHESIA/SEDATION: None CONTRAST:  73mL OMNIPAQUE IOHEXOL 300 MG/ML SOLN - administered into the collecting system(s) FLUOROSCOPY TIME:  Fluoroscopy Time: 1 minutes 6 seconds  (16.8 mGy). COMPLICATIONS: None PROCEDURE: Informed written consent was obtained from the patient after a thorough discussion of the procedural risks, benefits and alternatives. All questions were addressed. Maximal Sterile Barrier Technique was utilized including caps, mask, sterile gowns, sterile gloves, sterile drape, hand hygiene and skin antiseptic. A timeout was performed prior to the initiation of the procedure. Patient positioned in the supine position on the fluoroscopy table. The patient was prepped and draped in the usual sterile fashion. Left: 1% lidocaine was used for local anesthesia. Small amount of contrast was infused confirming location in the collecting system. Modified Seldinger technique was then used to exchange for a new 10 [place] percutaneous nephrostomy. Catheter was formed in the collecting system and contrast confirmed location. Catheter was sutured in location and patch to gravity drainage. Right: 1% lidocaine was used for local anesthesia. Small amount of contrast was infused confirming location in the collecting system. Modified Seldinger technique was then used to exchange for a new 10 [place] percutaneous nephrostomy. Catheter was formed in the collecting system and contrast confirmed location. Catheter was sutured in location and patch to gravity drainage. Patient tolerated the procedure well and remained hemodynamically stable throughout. No complications were encountered and no significant blood loss. IMPRESSION: Status post exchange of bilateral percutaneous nephrostomy. Signed, Dulcy Fanny. Dellia Nims, RPVI [facility] Electronically Signed   By: Corrie Mckusick D.O.   On: 03/25/2019 14:51     Assessment and plan- Patient is a 84 y.o. 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  Treatment was on hold due to decling performance status and recurrent hospitalizations.  He is here to discuss further treatment options  Based on my conversation with patient's wife over the phone today patient seems to be getting back slowly to his baseline prior to his hospitalization and nephrostomy tubes.  Even at baseline patient is frail but has been so since  diagnosis and he has been on some treatment together since March 2019.  I will check a CBC with differential and CMP today and give him 1 L of IV fluids.  Hold off on any treatment today but continue to see how patient does over the next 2 weeks.  Chronic leukocytosis possibly secondary to underlying malignancy.  Despite treating his UTI his white count remains high continue to monitor  Normocytic anemia: Likely secondary to recent hospitalization and UTI.  Continue to monitor  I will see him back in 2 weeks with a CBC with differential, CMP and consider giving him Tecentriq on that day.  Patient's wife understands that if he feels worse in the interim she will call us to set him up for IV fluids.  Patient and his wife are not accepting of hospice at this time   Visit Diagnosis 1. Dehydration   2. Malignant neoplasm of urinary bladder, unspecified site (HCC)   3. Leukocytosis, unspecified type   4. Normocytic anemia      Dr. Randa Evens, MD, MPH [facility] [phone] 04/23/2019 10:19 AM

## 2019-04-23 NOTE — Progress Notes (Signed)
Pt not in any pain, shaking today but he does that at times but today it was more than usual. He was cold and he has blankets on. He states his bottom is sore but does not have open wounds, he states that it is bony. He says he drinks 1 64 oz gatorade every day, 2 cups of coffee and some water. Bowels ae doing good, he feels appetite is good. The wife states with him having fluids- he has felt better. She states he will have 3-4 good days and then 1-2 bad days

## 2019-04-26 ENCOUNTER — Other Ambulatory Visit: Payer: Self-pay

## 2019-04-26 ENCOUNTER — Emergency Department: Payer: Medicare Other

## 2019-04-26 ENCOUNTER — Inpatient Hospital Stay
Admission: EM | Admit: 2019-04-26 | Discharge: 2019-04-29 | DRG: 698 | Disposition: A | Discharge status: Home / Self Care | Payer: Medicare Other | Attending: Internal Medicine | Admitting: Internal Medicine

## 2019-04-26 DIAGNOSIS — Z8249 Family history of ischemic heart disease and other diseases of the circulatory system: Secondary | ICD-10-CM

## 2019-04-26 DIAGNOSIS — N136 Pyonephrosis: Secondary | ICD-10-CM | POA: Diagnosis present

## 2019-04-26 DIAGNOSIS — I313 Pericardial effusion (noninflammatory): Secondary | ICD-10-CM | POA: Diagnosis present

## 2019-04-26 DIAGNOSIS — Z9221 Personal history of antineoplastic chemotherapy: Secondary | ICD-10-CM

## 2019-04-26 DIAGNOSIS — I10 Essential (primary) hypertension: Secondary | ICD-10-CM | POA: Diagnosis present

## 2019-04-26 DIAGNOSIS — C679 Malignant neoplasm of bladder, unspecified: Secondary | ICD-10-CM | POA: Diagnosis present

## 2019-04-26 DIAGNOSIS — Z7901 Long term (current) use of anticoagulants: Secondary | ICD-10-CM

## 2019-04-26 DIAGNOSIS — D649 Anemia, unspecified: Secondary | ICD-10-CM | POA: Diagnosis present

## 2019-04-26 DIAGNOSIS — F329 Major depressive disorder, single episode, unspecified: Secondary | ICD-10-CM | POA: Diagnosis present

## 2019-04-26 DIAGNOSIS — T83512A Infection and inflammatory reaction due to nephrostomy catheter, initial encounter: Principal | ICD-10-CM | POA: Diagnosis present

## 2019-04-26 DIAGNOSIS — Z8546 Personal history of malignant neoplasm of prostate: Secondary | ICD-10-CM

## 2019-04-26 DIAGNOSIS — Z841 Family history of disorders of kidney and ureter: Secondary | ICD-10-CM | POA: Diagnosis not present

## 2019-04-26 DIAGNOSIS — I700 Atherosclerosis of aorta: Secondary | ICD-10-CM | POA: Diagnosis present

## 2019-04-26 DIAGNOSIS — Z79899 Other long term (current) drug therapy: Secondary | ICD-10-CM | POA: Diagnosis not present

## 2019-04-26 DIAGNOSIS — Y846 Urinary catheterization as the cause of abnormal reaction of the patient, or of later complication, without mention of misadventure at the time of the procedure: Secondary | ICD-10-CM | POA: Diagnosis present

## 2019-04-26 DIAGNOSIS — Z79891 Long term (current) use of opiate analgesic: Secondary | ICD-10-CM

## 2019-04-26 DIAGNOSIS — T83518A Infection and inflammatory reaction due to other urinary catheter, initial encounter: Secondary | ICD-10-CM | POA: Diagnosis present

## 2019-04-26 DIAGNOSIS — E871 Hypo-osmolality and hyponatremia: Secondary | ICD-10-CM | POA: Diagnosis present

## 2019-04-26 DIAGNOSIS — H9190 Unspecified hearing loss, unspecified ear: Secondary | ICD-10-CM | POA: Diagnosis present

## 2019-04-26 DIAGNOSIS — Z20828 Contact with and (suspected) exposure to other viral communicable diseases: Secondary | ICD-10-CM | POA: Diagnosis present

## 2019-04-26 DIAGNOSIS — K219 Gastro-esophageal reflux disease without esophagitis: Secondary | ICD-10-CM | POA: Diagnosis present

## 2019-04-26 DIAGNOSIS — Z923 Personal history of irradiation: Secondary | ICD-10-CM

## 2019-04-26 DIAGNOSIS — J90 Pleural effusion, not elsewhere classified: Secondary | ICD-10-CM | POA: Diagnosis present

## 2019-04-26 DIAGNOSIS — Z87440 Personal history of urinary (tract) infections: Secondary | ICD-10-CM | POA: Diagnosis not present

## 2019-04-26 DIAGNOSIS — A4152 Sepsis due to Pseudomonas: Secondary | ICD-10-CM | POA: Diagnosis present

## 2019-04-26 DIAGNOSIS — A419 Sepsis, unspecified organism: Secondary | ICD-10-CM | POA: Diagnosis present

## 2019-04-26 DIAGNOSIS — E876 Hypokalemia: Secondary | ICD-10-CM | POA: Diagnosis present

## 2019-04-26 DIAGNOSIS — N390 Urinary tract infection, site not specified: Secondary | ICD-10-CM

## 2019-04-26 DIAGNOSIS — Z9049 Acquired absence of other specified parts of digestive tract: Secondary | ICD-10-CM

## 2019-04-26 LAB — CBC
HCT: 25.2 % — ABNORMAL LOW (ref 39.0–52.0)
Hemoglobin: 7.9 g/dL — ABNORMAL LOW (ref 13.0–17.0)
MCH: 29.4 pg (ref 26.0–34.0)
MCHC: 31.3 g/dL (ref 30.0–36.0)
MCV: 93.7 fL (ref 80.0–100.0)
Platelets: 317 10*3/uL (ref 150–400)
RBC: 2.69 MIL/uL — ABNORMAL LOW (ref 4.22–5.81)
RDW: 15.4 % (ref 11.5–15.5)
WBC: 23.6 10*3/uL — ABNORMAL HIGH (ref 4.0–10.5)
nRBC: 0 % (ref 0.0–0.2)

## 2019-04-26 LAB — CBC WITH DIFFERENTIAL/PLATELET
Abs Immature Granulocytes: 0.33 10*3/uL — ABNORMAL HIGH (ref 0.00–0.07)
Basophils Absolute: 0.1 10*3/uL (ref 0.0–0.1)
Basophils Relative: 0 %
Eosinophils Absolute: 0.2 10*3/uL (ref 0.0–0.5)
Eosinophils Relative: 1 %
HCT: 27.9 % — ABNORMAL LOW (ref 39.0–52.0)
Hemoglobin: 8.9 g/dL — ABNORMAL LOW (ref 13.0–17.0)
Immature Granulocytes: 1 %
Lymphocytes Relative: 5 %
Lymphs Abs: 1.4 10*3/uL (ref 0.7–4.0)
MCH: 29.8 pg (ref 26.0–34.0)
MCHC: 31.9 g/dL (ref 30.0–36.0)
MCV: 93.3 fL (ref 80.0–100.0)
Monocytes Absolute: 1.9 10*3/uL — ABNORMAL HIGH (ref 0.1–1.0)
Monocytes Relative: 7 %
Neutro Abs: 22 10*3/uL — ABNORMAL HIGH (ref 1.7–7.7)
Neutrophils Relative %: 86 %
Platelets: 393 10*3/uL (ref 150–400)
RBC: 2.99 MIL/uL — ABNORMAL LOW (ref 4.22–5.81)
RDW: 15.5 % (ref 11.5–15.5)
Smear Review: NORMAL
WBC: 25.9 10*3/uL — ABNORMAL HIGH (ref 4.0–10.5)
nRBC: 0 % (ref 0.0–0.2)

## 2019-04-26 LAB — URINALYSIS, ROUTINE W REFLEX MICROSCOPIC
Bilirubin Urine: NEGATIVE
Glucose, UA: NEGATIVE mg/dL
Ketones, ur: NEGATIVE mg/dL
Nitrite: NEGATIVE
Protein, ur: 100 mg/dL — AB
Specific Gravity, Urine: 1.012 (ref 1.005–1.030)
Squamous Epithelial / HPF: NONE SEEN (ref 0–5)
WBC, UA: >50 WBC/hpf — ABNORMAL HIGH (ref 0–5)
pH: 6 (ref 5.0–8.0)

## 2019-04-26 LAB — BLOOD CULTURE ID PANEL (REFLEXED)

## 2019-04-26 LAB — BASIC METABOLIC PANEL
Anion gap: 7 (ref 5–15)
BUN: 19 mg/dL (ref 8–23)
CO2: 25 mmol/L (ref 22–32)
Calcium: 8.2 mg/dL — ABNORMAL LOW (ref 8.9–10.3)
Chloride: 101 mmol/L (ref 98–111)
Creatinine, Ser: 1.17 mg/dL (ref 0.61–1.24)
GFR calc Af Amer: >60 mL/min (ref >60)
GFR calc non Af Amer: 58 mL/min — ABNORMAL LOW (ref >60)
Glucose, Bld: 141 mg/dL — ABNORMAL HIGH (ref 70–99)
Potassium: 3.3 mmol/L — ABNORMAL LOW (ref 3.5–5.1)
Sodium: 133 mmol/L — ABNORMAL LOW (ref 135–145)

## 2019-04-26 LAB — LACTIC ACID, PLASMA
Lactic Acid, Venous: 2 mmol/L (ref 0.5–1.9)
Lactic Acid, Venous: 0.9 mmol/L (ref 0.5–1.9)
Lactic Acid, Venous: 0.8 mmol/L (ref 0.5–1.9)

## 2019-04-26 LAB — COMPREHENSIVE METABOLIC PANEL
ALT: 13 U/L (ref 0–44)
AST: 13 U/L — ABNORMAL LOW (ref 15–41)
Albumin: 2.6 g/dL — ABNORMAL LOW (ref 3.5–5.0)
Alkaline Phosphatase: 80 U/L (ref 38–126)
Anion gap: 10 (ref 5–15)
BUN: 19 mg/dL (ref 8–23)
CO2: 24 mmol/L (ref 22–32)
Calcium: 8.6 mg/dL — ABNORMAL LOW (ref 8.9–10.3)
Chloride: 98 mmol/L (ref 98–111)
Creatinine, Ser: 1.22 mg/dL (ref 0.61–1.24)
GFR calc Af Amer: >60 mL/min (ref >60)
GFR calc non Af Amer: 55 mL/min — ABNORMAL LOW (ref >60)
Glucose, Bld: 136 mg/dL — ABNORMAL HIGH (ref 70–99)
Potassium: 4 mmol/L (ref 3.5–5.1)
Sodium: 132 mmol/L — ABNORMAL LOW (ref 135–145)
Total Bilirubin: 0.5 mg/dL (ref 0.3–1.2)
Total Protein: 6.9 g/dL (ref 6.5–8.1)

## 2019-04-26 LAB — SARS CORONAVIRUS 2 BY RT PCR (HOSPITAL ORDER, PERFORMED IN ~~LOC~~ HOSPITAL LAB): SARS Coronavirus 2: NEGATIVE

## 2019-04-26 LAB — MAGNESIUM: Magnesium: 1.6 mg/dL — ABNORMAL LOW (ref 1.7–2.4)

## 2019-04-26 MED ORDER — ONDANSETRON HCL 4 MG/2ML IJ SOLN: 4.0000 mg | Freq: Four times a day (QID) | INTRAMUSCULAR | Status: DC | PRN

## 2019-04-26 MED ORDER — METOPROLOL TARTRATE 25 MG PO TABS
25.0000 mg | ORAL_TABLET | Freq: Two times a day (BID) | ORAL | Status: DC
  Administered 2019-04-26 – 2019-04-29 (×5): 25 mg via ORAL
  Filled 2019-04-26 (×6): qty 1

## 2019-04-26 MED ORDER — LABETALOL HCL 5 MG/ML IV SOLN: 20.0000 mg | INTRAVENOUS | Status: DC | PRN

## 2019-04-26 MED ORDER — POTASSIUM CHLORIDE CRYS ER 20 MEQ PO TBCR
20.0000 meq | EXTENDED_RELEASE_TABLET | Freq: Two times a day (BID) | ORAL | Status: DC
  Administered 2019-04-26 – 2019-04-29 (×7): 20 meq via ORAL
  Filled 2019-04-26 (×7): qty 1

## 2019-04-26 MED ORDER — ADULT MULTIVITAMIN W/MINERALS CH
1.0000 | ORAL_TABLET | Freq: Every day | ORAL | Status: DC
  Administered 2019-04-26 – 2019-04-29 (×4): 1 via ORAL
  Filled 2019-04-26 (×4): qty 1

## 2019-04-26 MED ORDER — SODIUM CHLORIDE 0.9 % IV SOLN
INTRAVENOUS | Status: DC
  Administered 2019-04-26 (×3): via INTRAVENOUS

## 2019-04-26 MED ORDER — CRANBERRY-CHOLECALCIFEROL 4200-500 MG-UNIT PO CAPS: 1.0000 | ORAL_CAPSULE | Freq: Two times a day (BID) | ORAL | Status: DC

## 2019-04-26 MED ORDER — ACETAMINOPHEN 500 MG PO TABS
1000.0000 mg | ORAL_TABLET | Freq: Four times a day (QID) | ORAL | Status: DC | PRN
  Administered 2019-04-26: 1000 mg via ORAL
  Filled 2019-04-26: qty 2

## 2019-04-26 MED ORDER — SODIUM CHLORIDE 0.9 % IV SOLN
2.0000 g | Freq: Once | INTRAVENOUS | Status: AC
  Administered 2019-04-26: 02:00:00 2 g via INTRAVENOUS
  Filled 2019-04-26: qty 2

## 2019-04-26 MED ORDER — POTASSIUM CHLORIDE 20 MEQ PO PACK
40.0000 meq | PACK | Freq: Once | ORAL | Status: AC
  Administered 2019-04-26: 40 meq via ORAL
  Filled 2019-04-26: qty 2

## 2019-04-26 MED ORDER — LORATADINE 10 MG PO TABS
10.0000 mg | ORAL_TABLET | Freq: Every day | ORAL | Status: DC
  Administered 2019-04-26 – 2019-04-29 (×4): 10 mg via ORAL
  Filled 2019-04-26 (×4): qty 1

## 2019-04-26 MED ORDER — MAGNESIUM HYDROXIDE 400 MG/5ML PO SUSP: 30.0000 mL | Freq: Every day | ORAL | Status: DC | PRN

## 2019-04-26 MED ORDER — VANCOMYCIN HCL IN DEXTROSE 1-5 GM/200ML-% IV SOLN
1000.0000 mg | Freq: Once | INTRAVENOUS | Status: AC
  Administered 2019-04-26: 1000 mg via INTRAVENOUS
  Filled 2019-04-26: qty 200

## 2019-04-26 MED ORDER — FERROUS SULFATE 325 (65 FE) MG PO TABS
325.0000 mg | ORAL_TABLET | Freq: Two times a day (BID) | ORAL | Status: DC
  Administered 2019-04-26 – 2019-04-28 (×5): 325 mg via ORAL
  Filled 2019-04-26 (×5): qty 1

## 2019-04-26 MED ORDER — POLYETHYLENE GLYCOL 3350 17 G PO PACK: 17.0000 g | PACK | Freq: Every day | ORAL | Status: DC | PRN

## 2019-04-26 MED ORDER — ONDANSETRON HCL 4 MG PO TABS
4.0000 mg | ORAL_TABLET | Freq: Four times a day (QID) | ORAL | Status: DC | PRN
  Administered 2019-04-26: 4 mg via ORAL
  Filled 2019-04-26: qty 1

## 2019-04-26 MED ORDER — SODIUM CHLORIDE 0.9 % IV SOLN
2.0000 g | Freq: Two times a day (BID) | INTRAVENOUS | Status: DC
  Administered 2019-04-26 (×2): 2 g via INTRAVENOUS
  Filled 2019-04-26 (×3): qty 2

## 2019-04-26 MED ORDER — OXYCODONE HCL 5 MG PO TABS
5.0000 mg | ORAL_TABLET | ORAL | Status: DC | PRN
  Administered 2019-04-26: 5 mg via ORAL
  Filled 2019-04-26 (×2): qty 1

## 2019-04-26 MED ORDER — SODIUM CHLORIDE 0.9 % IV BOLUS
500.0000 mL | Freq: Once | INTRAVENOUS | Status: AC
  Administered 2019-04-26: 500 mL via INTRAVENOUS

## 2019-04-26 MED ORDER — VANCOMYCIN HCL IN DEXTROSE 1-5 GM/200ML-% IV SOLN
1000.0000 mg | INTRAVENOUS | Status: DC
  Administered 2019-04-26 – 2019-04-27 (×2): 1000 mg via INTRAVENOUS
  Filled 2019-04-26 (×3): qty 200

## 2019-04-26 MED ORDER — CYANOCOBALAMIN 500 MCG PO TABS
500.0000 ug | ORAL_TABLET | Freq: Every day | ORAL | Status: DC
  Administered 2019-04-26 – 2019-04-29 (×3): 500 ug via ORAL
  Filled 2019-04-26 (×4): qty 1

## 2019-04-26 MED ORDER — APIXABAN 5 MG PO TABS
5.0000 mg | ORAL_TABLET | Freq: Two times a day (BID) | ORAL | Status: DC
  Administered 2019-04-26 – 2019-04-29 (×7): 5 mg via ORAL
  Filled 2019-04-26 (×7): qty 1

## 2019-04-26 MED ORDER — MAGNESIUM SULFATE 2 GM/50ML IV SOLN
2.0000 g | Freq: Once | INTRAVENOUS | Status: AC
  Administered 2019-04-26: 2 g via INTRAVENOUS
  Filled 2019-04-26: qty 50

## 2019-04-26 MED ORDER — MIRABEGRON ER 50 MG PO TB24
50.0000 mg | ORAL_TABLET | Freq: Every day | ORAL | Status: DC
  Administered 2019-04-26 – 2019-04-29 (×3): 50 mg via ORAL
  Filled 2019-04-26 (×4): qty 1

## 2019-04-26 NOTE — Progress Notes (Signed)
PHARMACIST - PHYSICIAN ORDER COMMUNICATION  CONCERNING: P&T Medication Policy on Herbal Medications  DESCRIPTION:  This patient's order for:  Cranberry-cholecalceferol  has been noted.  This product(s) is classified as an "herbal" or natural product. Due to a lack of definitive safety studies or FDA approval, nonstandard manufacturing practices, plus the potential risk of unknown drug-drug interactions while on inpatient medications, the Pharmacy and Therapeutics Committee does not permit the use of "herbal" or natural products of this type within [facility].   ACTION TAKEN: The pharmacy department is unable to verify this order at this time and your patient has been informed of this safety policy. Please reevaluate patient's clinical condition at discharge and address if the herbal or natural product(s) should be resumed at that time.  Paulina Fusi, PharmD, BCPS 04/26/2019 4:31 AM

## 2019-04-26 NOTE — H&P (Addendum)
[facility] [place] NAME: Vincent Poole    MR#:  [id number]  DATE OF BIRTH:  21-Jul-1935  DATE OF ADMISSION:  04/26/2019  PRIMARY CARE PHYSICIAN: [name], MD   REQUESTING/REFERRING PHYSICIAN: Marjean Donna, MD  CHIEF COMPLAINT:   Chief Complaint  Patient presents with   Back Pain    HISTORY OF PRESENT ILLNESS:  Vincent Poole  is a 84 y.o. Caucasian male with a known history of multiple medical problems that are mentioned below, including bilateral nephrostomy tubes who presented to the emergency room with acute onset of back pain.  He was noted to be hypotensive and tachycardic by EMS with subjective fever.  His temperature was up to 101.8 with a heart rate of 120 and respiratory rate of 22-24 prior to arrival to the ER.  History taking from the patient is very limited due to suspected underlying dementia.  He was fairly asymptomatic.  Upon presentation to the emergency room, vital signs were as above.  His labs were remarkable for mild hyponatremia and a lactic acid of 2 with anemia close to his baseline and a urinalysis that was strongly positive for UTI.  Abdominal and pelvic CT showed bilateral nephrostomy tubes in place with mild right hydronephrosis that is improved from most recent study with slight prominence of the left renal collecting system which has slightly increased.  There was partially necrotic bladder tumor that is grossly unchanged from prior CT.  It also showed resolved right pleural effusion and decreased pericardial effusion from prior CT.  It showed aortic atherosclerosis.  EKG showed sinus tachycardia with a rate of 111.  The patient was given 2 g of IV cefepime.  He will be admitted to the medical monitored bed for further evaluation and management.   PAST MEDICAL HISTORY:   Past Medical History:  Diagnosis Date   Anxiety    Arthritis    Bladder cancer ([place])    Dysrhythmia 07/2018   history of  atrial flutter that worsens with anxiety   Femur fracture, right ([place]) 05/01/2018   GERD (gastroesophageal reflux disease)    History of recent blood transfusion 05/2018   Hypertension    Iron deficiency anemia 09/14/2018   Prostate cancer ([place]) 07/2018   cancer growing in prostate but not prostate cancer, it is from the bladder   Umbilical hernia 79/8921   Urinary retention 2019   foley catheter place 11/2017   UTI (urinary tract infection) 2019   frequent UTI's over last year   Wound eschar of foot 07/2018   left heal getting wrapped and requiring antibiotic cream. cracks open with weight bearing.    PAST SURGICAL HISTORY:   Past Surgical History:  Procedure Laterality Date   CARPAL TUNNEL RELEASE Right    CHOLECYSTECTOMY  2004   CYSTOGRAM  07/18/2018   Procedure: CYSTOGRAM;  Surgeon: [name], MD;  Location: [facility];  Service: Urology;;   Consuela Mimes W/ RETROGRADES Bilateral 07/18/2018   Procedure: CYSTOSCOPY WITH RETROGRADE PYELOGRAM;  Surgeon: [name], MD;  Location: [facility];  Service: Urology;  Laterality: Bilateral;   CYSTOSCOPY W/ URETERAL STENT PLACEMENT Bilateral 12/27/2017   Procedure: CYSTOSCOPY WITH RETROGRADE PYELOGRAM/URETERAL STENT PLACEMENT;  Surgeon: [name], MD;  Location: [facility];  Service: Urology;  Laterality: Bilateral;   CYSTOSCOPY W/ URETERAL STENT PLACEMENT Bilateral 07/18/2018   Procedure: CYSTOSCOPY WITH STENT REPLACEMENT (exchange);  Surgeon: [name], MD;  Location: [facility];  Service: Urology;  Laterality: Bilateral;  CYSTOSCOPY WITH STENT PLACEMENT Bilateral 11/12/2018   Procedure: [place] WITH STENT Exchange;  Surgeon: [name], MD;  Location: [facility];  Service: Urology;  Laterality: Bilateral;   INTRAMEDULLARY (IM) NAIL INTERTROCHANTERIC Right 05/02/2018   Procedure: INTRAMEDULLARY (IM) NAIL INTERTROCHANTRIC;  Surgeon: [name], MD;  Location: [facility];  Service: Orthopedics;  Laterality:  Right;   IR NEPHROSTOMY EXCHANGE LEFT  03/25/2019   IR NEPHROSTOMY EXCHANGE LEFT  04/19/2019   IR NEPHROSTOMY EXCHANGE RIGHT  03/25/2019   IR NEPHROSTOMY EXCHANGE RIGHT  04/19/2019   IR NEPHROSTOMY PLACEMENT LEFT  02/23/2019   IR NEPHROSTOMY PLACEMENT RIGHT  02/23/2019   LEG TENDON SURGERY Right 1958   PORTA CATH INSERTION N/A 01/22/2018   Procedure: PORTA CATH INSERTION;  Surgeon: [name], MD;  Location: [facility];  Service: Cardiovascular;  Laterality: N/A;   TRANSURETHRAL RESECTION OF BLADDER TUMOR N/A 12/27/2017   Procedure: TRANSURETHRAL RESECTION OF BLADDER TUMOR (TURBT);  Surgeon: [name], MD;  Location: [facility];  Service: Urology;  Laterality: N/A;   TRANSURETHRAL RESECTION OF BLADDER TUMOR N/A 01/15/2018   Procedure: TRANSURETHRAL RESECTION OF BLADDER TUMOR (TURBT);  Surgeon: [name], MD;  Location: [facility];  Service: Urology;  Laterality: N/A;  Need 2 hrs for this case please   TRANSURETHRAL RESECTION OF BLADDER TUMOR N/A 07/18/2018   Procedure: TRANSURETHRAL RESECTION OF BLADDER TUMOR (TURBT);  Surgeon: [name], MD;  Location: [facility];  Service: Urology;  Laterality: N/A;    SOCIAL HISTORY:   Social History   Tobacco Use   Smoking status: Never Smoker   Smokeless tobacco: Never Used  Substance Use Topics   Alcohol use: No    Alcohol/week: 0.0 standard drinks    FAMILY HISTORY:   Family History  Problem Relation Age of Onset   Cancer Mother    Chronic Renal Failure Mother    Heart disease Father     DRUG ALLERGIES:  No Known Allergies  REVIEW OF SYSTEMS:   ROS As per history of present illness. All pertinent systems were reviewed above. Constitutional,  HEENT, cardiovascular, respiratory, GI, GU, musculoskeletal, neuro, psychiatric, endocrine,  integumentary and hematologic systems were reviewed and are otherwise  negative/unremarkable except for positive findings mentioned above in the HPI.   MEDICATIONS  AT HOME:   Prior to Admission medications   Medication Sig Start Date End Date Taking? Authorizing Provider  acetaminophen (TYLENOL) 500 MG tablet Take 1,000 mg by mouth every 6 (six) hours as needed for moderate pain or headache.     [provider]  cetirizine (ZYRTEC) 10 MG tablet Take 10 mg by mouth daily as needed for allergies.     [provider]  Cranberry-Cholecalciferol (SUPER CRANBERRY/VITAMIN D3) 4200-500 MG-UNIT CAPS Take 1 capsule by mouth 2 (two) times daily.    [provider]  ELIQUIS 5 MG TABS tablet TAKE ONE TABLET BY MOUTH TWICE DAILY Patient taking differently: Take 5 mg by mouth 2 (two) times daily.  01/30/19   Cammie Sickle, MD  ferrous sulfate 325 (65 FE) MG tablet Take 1 tablet (325 mg total) by mouth 2 (two) times daily with a meal. 05/06/18   Gouru, Aruna, MD  lidocaine-prilocaine (EMLA) cream Apply 1 application topically as needed. 01/02/19   Sindy Guadeloupe, MD  Multiple Vitamin (MULTIVITAMIN WITH MINERALS) TABS tablet Take 1 tablet by mouth daily.    [provider]  MYRBETRIQ 50 MG TB24 tablet TAKE ONE TABLET EVERY DAY Patient taking differently: Take 50  mg by mouth daily.  01/02/19   Zara Council A, PA-C  nystatin [facility]) powder Apply topically 2 (two) times daily. 09/28/18   Zara Council A, PA-C  oxyCODONE (OXY IR/ROXICODONE) 5 MG immediate release tablet Take 1 tablet (5 mg total) by mouth every 4 (four) hours as needed for severe pain. 03/21/19   Sindy Guadeloupe, MD  polyethylene glycol powder (MIRALAX) powder Take 17 g by mouth daily as needed. Can increase to 3 times a day as needed for constipation but hold medication if has diarrhea 01/30/18   Sindy Guadeloupe, MD  potassium chloride SA (K-DUR) 20 MEQ tablet Take 20 mEq by mouth 2 (two) times daily.    [provider]  vitamin B-12 (CYANOCOBALAMIN) 500 MCG tablet Take 500 mcg by mouth daily.    [provider]  zinc oxide 20 % ointment Apply 1  application topically as needed for irritation.    [provider]      VITAL SIGNS:  Blood pressure 132/79, pulse (!) 117, temperature (!) 101.8 F (38.8 C), temperature source Rectal, resp. rate (!) 25, SpO2 99 %.  PHYSICAL EXAMINATION:  Physical Exam  GENERAL:  84 y.o.-year-old Caucasian male patient lying in the bed with no acute distress.  EYES: Pupils equal, round, reactive to light and accommodation. No scleral icterus. Extraocular muscles intact.  HEENT: Head atraumatic, normocephalic. Oropharynx and nasopharynx clear.  NECK:  Supple, no jugular venous distention. No thyroid enlargement, no tenderness.  LUNGS: Normal breath sounds bilaterally, no wheezing, rales,rhonchi or crepitation. No use of accessory muscles of respiration.  CARDIOVASCULAR: Regular rate and rhythm, S1, S2 normal. No murmurs, rubs, or gallops.  ABDOMEN: Soft, nondistended, nontender. Bowel sounds present. No organomegaly or mass.  EXTREMITIES: No pedal edema, cyanosis, or clubbing.  NEUROLOGIC: Cranial nerves II through XII are intact. Muscle strength 5/5 in all extremities. Sensation intact. Gait not checked.  GU: Exam deferred SKIN: No obvious rash, lesion, or ulcer.   LABORATORY PANEL:   CBC Recent Labs  Lab 04/26/19 0133  WBC 25.9*  HGB 8.9*  HCT 27.9*  PLT 393   ------------------------------------------------------------------------------------------------------------------  Chemistries  Recent Labs  Lab 04/26/19 0133  NA 132*  K 4.0  CL 98  CO2 24  GLUCOSE 136*  BUN 19  CREATININE 1.22  CALCIUM 8.6*  AST 13*  ALT 13  ALKPHOS 80  BILITOT 0.5   ------------------------------------------------------------------------------------------------------------------  Cardiac Enzymes No results for input(s): TROPONINI in the last 168 hours. ------------------------------------------------------------------------------------------------------------------  RADIOLOGY:  No  results found.    IMPRESSION AND PLAN:   1.  Sepsis secondary to UTI.  He has no evidence for severe sepsis or septic shock.  The patient will be admitted to medical monitored bed.  He will be placed on broader spectrum antibiotic therapy with IV cefepime and vancomycin for now.  A urology consultation will be obtained by Dr. Erlene Quan.  I notified her regarding the consult.  We will follow his urine and blood cultures.  We will follow his lactic acid level.  2.  Hypokalemia.  Potassium will be replaced and magnesium level will be checked  3.  Hypertension.  Blood pressure is currently fairly controlled.  He will be placed on PRN IV labetalol.  4.  Depression.  We will continue his Myrbetriq.  5.  History of urinary bladder and prostate cancer.  He is followed by Dr. Erlene Quan who will be consulted..  6.  DVT prophylaxis.  Subcutaneous Lovenox All the records are reviewed and case  discussed with ED provider. The plan of care was discussed in details with the patient (and family). I answered all questions. The patient agreed to proceed with the above mentioned plan. Further management will depend upon hospital course.   CODE STATUS: Full code  TOTAL TIME TAKING CARE OF THIS PATIENT: 50 minutes.    Christel Mormon M.D on 04/26/2019 at 2:48 AM  Pager - [phone]  After 6pm go to [email or web address] - Proofreader  [facility] [place] [facility]  Office  [phone]  CC: Primary care physician; [name], MD   Note: This dictation was prepared with Dragon dictation along with smaller phrase technology. Any transcriptional errors that result from this process are unintentional.

## 2019-04-26 NOTE — ED Notes (Signed)
Lactic 2.0 CRITICAL LAB REPORT

## 2019-04-26 NOTE — ED Notes (Signed)
ED TO INPATIENT HANDOFF REPORT  ED Nurse Name and Phone #: Terri Piedra [phone]  S Name/Age/Gender Vincent Poole 84 y.o. male Room/Bed: ED01A/ED01A  Code Status   Code Status: Prior  Home/SNF/Other Home Patient oriented to: self, place, time and situation Is this baseline? Yes   Triage Complete: Triage complete  Chief Complaint back pain   Triage Note Pt to ED via EMS from home with c/o right leg pain from operation 1 yr ago. Per ems they were called out for chronic back pain. Pt arrives tachycardiac at 123. Pt has bilateral nephrostomy tubes. Pt states he is here because he cant walk.    Allergies No Known Allergies  Level of Care/Admitting Diagnosis ED Disposition    ED Disposition Condition [facility]: [facility] [[id number]]  Level of Care: Med-Surg [16]  Covid Evaluation: Screening Protocol (No Symptoms)  Diagnosis: Sepsis [facility]) [[id number]]  Admitting Physician: Christel Mormon [[id number]]  Attending Physician: Christel Mormon [[id number]]  Estimated length of stay: past midnight tomorrow  Certification:: I certify this patient will need inpatient services for at least 2 midnights  PT Class (Do Not Modify): Inpatient [101]  PT Acc Code (Do Not Modify): Private [1]       B Medical/Surgery History Past Medical History:  Diagnosis Date  . Anxiety   . Arthritis   . Bladder cancer ([place])   . Dysrhythmia 07/2018   history of atrial flutter that worsens with anxiety  . Femur fracture, right ([place]) 05/01/2018  . GERD (gastroesophageal reflux disease)   . History of recent blood transfusion 05/2018  . Hypertension   . Iron deficiency anemia 09/14/2018  . Prostate cancer ([place]) 07/2018   cancer growing in prostate but not prostate cancer, it is from the bladder  . Umbilical hernia 39/0300  . Urinary retention 2019   foley catheter place 11/2017  . UTI (urinary tract infection) 2019   frequent UTI's over last year  . Wound eschar of foot  07/2018   left heal getting wrapped and requiring antibiotic cream. cracks open with weight bearing.   Past Surgical History:  Procedure Laterality Date  . CARPAL TUNNEL RELEASE Right   . CHOLECYSTECTOMY  2004  . CYSTOGRAM  07/18/2018   Procedure: CYSTOGRAM;  Surgeon: [name], MD;  Location: [facility] OR[facility];  Service: Urology;;  . Consuela Mimes W/ RETROGRADES Bilateral 07/18/2018   Procedure: CYSTOSCOPY WITH RETROGRADE PYELOGRAM;  Surgeon: [name], MD;  Location: [facility];  Service: Urology;  Laterality: Bilateral;  . CYSTOSCOPY W/ URETERAL STENT PLACEMENT Bilateral 12/27/2017   Procedure: CYSTOSCOPY WITH RETROGRADE PYELOGRAM/URETERAL STENT PLACEMENT;  Surgeon: [name], MD;  Location: [facility];  Service: Urology;  Laterality: Bilateral;  . CYSTOSCOPY W/ URETERAL STENT PLACEMENT Bilateral 07/18/2018   Procedure: CYSTOSCOPY WITH STENT REPLACEMENT (exchange);  Surgeon: [name], MD;  Location: [facility];  Service: Urology;  Laterality: Bilateral;  . CYSTOSCOPY WITH STENT PLACEMENT Bilateral 11/12/2018   Procedure: [place] WITH STENT Exchange;  Surgeon: [name], MD;  Location: [facility];  Service: Urology;  Laterality: Bilateral;  . INTRAMEDULLARY (IM) NAIL INTERTROCHANTERIC Right 05/02/2018   Procedure: INTRAMEDULLARY (IM) NAIL INTERTROCHANTRIC;  Surgeon: [name], MD;  Location: [facility];  Service: Orthopedics;  Laterality: Right;  . IR NEPHROSTOMY EXCHANGE LEFT  03/25/2019  . IR NEPHROSTOMY EXCHANGE LEFT  04/19/2019  . IR NEPHROSTOMY EXCHANGE RIGHT  03/25/2019  . IR NEPHROSTOMY EXCHANGE RIGHT  04/19/2019  . IR NEPHROSTOMY PLACEMENT LEFT  02/23/2019  .  IR NEPHROSTOMY PLACEMENT RIGHT  02/23/2019  . LEG TENDON SURGERY Right 1958  . PORTA CATH INSERTION N/A 01/22/2018   Procedure: PORTA CATH INSERTION;  Surgeon: [name], MD;  Location: [facility];  Service: Cardiovascular;  Laterality: N/A;  . TRANSURETHRAL RESECTION OF BLADDER TUMOR N/A 12/27/2017    Procedure: TRANSURETHRAL RESECTION OF BLADDER TUMOR (TURBT);  Surgeon: [name], MD;  Location: [facility];  Service: Urology;  Laterality: N/A;  . TRANSURETHRAL RESECTION OF BLADDER TUMOR N/A 01/15/2018   Procedure: TRANSURETHRAL RESECTION OF BLADDER TUMOR (TURBT);  Surgeon: [name], MD;  Location: [facility];  Service: Urology;  Laterality: N/A;  Need 2 hrs for this case please  . TRANSURETHRAL RESECTION OF BLADDER TUMOR N/A 07/18/2018   Procedure: TRANSURETHRAL RESECTION OF BLADDER TUMOR (TURBT);  Surgeon: [name], MD;  Location: [facility];  Service: Urology;  Laterality: N/A;     A IV Location/Drains/Wounds Patient Lines/Drains/Airways Status   Active Line/Drains/Airways    Name:   Placement date:   Placement time:   Site:   Days:   Implanted Port 03/21/19 Right Chest   03/21/19    0916    Chest   36   Peripheral IV 04/26/19 Right Antecubital   04/26/19    0125    Antecubital   less than 1   Nephrostomy Left 10.2 Fr.   03/25/19    1421    Left   32   Nephrostomy Right 10.2 Fr.   03/25/19    1422    Right   32   Nephrostomy Right 12 Fr.   04/19/19    0939    Right   7   Nephrostomy Left 12 Fr.   04/19/19    0939    Left   7   Pressure Injury 03/22/19 Stage I -  Intact skin with non-blanchable redness of a localized area usually over a bony prominence. red but skin intact   03/22/19    2339     35          Intake/Output Last 24 hours No intake or output data in the 24 hours ending 04/26/19 0324  Labs/Imaging Results for orders placed or performed during the hospital encounter of 04/26/19 (from the past 48 hour(s))  Lactic acid, plasma     Status: Abnormal   Collection Time: 04/26/19  1:33 AM  Result Value Ref Range   Lactic Acid, Venous 2.0 (HH) 0.5 - 1.9 mmol/L    Comment: CRITICAL RESULT CALLED TO, READ BACK BY AND VERIFIED WITH VANESSA  Kylani Wires ON 04/26/2019 AT 0202 QSD Performed at [facility], [address] [place], [place] [address]    Comprehensive metabolic panel     Status: Abnormal   Collection Time: 04/26/19  1:33 AM  Result Value Ref Range   Sodium 132 (L) 135 - 145 mmol/L   Potassium 4.0 3.5 - 5.1 mmol/L   Chloride 98 98 - 111 mmol/L   CO2 24 22 - 32 mmol/L   Glucose, Bld 136 (H) 70 - 99 mg/dL   BUN 19 8 - 23 mg/dL   Creatinine, Ser 1.22 0.61 - 1.24 mg/dL   Calcium 8.6 (L) 8.9 - 10.3 mg/dL   Total Protein 6.9 6.5 - 8.1 g/dL   Albumin 2.6 (L) 3.5 - 5.0 g/dL   AST 13 (L) 15 - 41 U/L   ALT 13 0 - 44 U/L   Alkaline Phosphatase 80 38 - 126 U/L   Total  Bilirubin 0.5 0.3 - 1.2 mg/dL   GFR calc non Af Amer 55 (L) >60 mL/min   GFR calc Af Amer >60 >60 mL/min   Anion gap 10 5 - 15    Comment: Performed at [facility], [address] [place], [place] [address]  CBC WITH DIFFERENTIAL     Status: Abnormal   Collection Time: 04/26/19  1:33 AM  Result Value Ref Range   WBC 25.9 (H) 4.0 - 10.5 K/uL   RBC 2.99 (L) 4.22 - 5.81 MIL/uL   Hemoglobin 8.9 (L) 13.0 - 17.0 g/dL   HCT 27.9 (L) 39.0 - 52.0 %   MCV 93.3 80.0 - 100.0 fL   MCH 29.8 26.0 - 34.0 pg   MCHC 31.9 30.0 - 36.0 g/dL   RDW 15.5 11.5 - 15.5 %   Platelets 393 150 - 400 K/uL   nRBC 0.0 0.0 - 0.2 %   Neutrophils Relative % 86 %   Neutro Abs 22.0 (H) 1.7 - 7.7 K/uL   Lymphocytes Relative 5 %   Lymphs Abs 1.4 0.7 - 4.0 K/uL   Monocytes Relative 7 %   Monocytes Absolute 1.9 (H) 0.1 - 1.0 K/uL   Eosinophils Relative 1 %   Eosinophils Absolute 0.2 0.0 - 0.5 K/uL   Basophils Relative 0 %   Basophils Absolute 0.1 0.0 - 0.1 K/uL   WBC Morphology MORPHOLOGY UNREMARKABLE    RBC Morphology MORPHOLOGY UNREMARKABLE    Smear Review Normal platelet morphology    Immature Granulocytes 1 %   Abs Immature Granulocytes 0.33 (H) 0.00 - 0.07 K/uL    Comment: Performed at [facility], [address] [place], [place] [address]  Urinalysis, Routine w reflex microscopic     Status: Abnormal   Collection Time: 04/26/19  1:33 AM  Result Value Ref  Range   Color, Urine YELLOW (A) YELLOW   APPearance CLOUDY (A) CLEAR   Specific Gravity, Urine 1.012 1.005 - 1.030   pH 6.0 5.0 - 8.0   Glucose, UA NEGATIVE NEGATIVE mg/dL   Hgb urine dipstick MODERATE (A) NEGATIVE   Bilirubin Urine NEGATIVE NEGATIVE   Ketones, ur NEGATIVE NEGATIVE mg/dL   Protein, ur 100 (A) NEGATIVE mg/dL   Nitrite NEGATIVE NEGATIVE   Leukocytes,Ua LARGE (A) NEGATIVE   RBC / HPF 21-50 0 - 5 RBC/hpf   WBC, UA >50 (H) 0 - 5 WBC/hpf   Bacteria, UA MANY (A) NONE SEEN   Squamous Epithelial / LPF NONE SEEN 0 - 5   WBC Clumps PRESENT    Mucus PRESENT     Comment: Performed at [facility], [address] [place], [place] [address]  SARS Coronavirus 2 (CEPHEID - Performed in [place] hospital lab), Hosp Order     Status: None   Collection Time: 04/26/19  1:33 AM   Specimen: Nasopharyngeal Swab  Result Value Ref Range   SARS Coronavirus 2 NEGATIVE NEGATIVE    Comment: (NOTE) If result is NEGATIVE SARS-CoV-2 target nucleic acids are NOT DETECTED. The SARS-CoV-2 RNA is generally detectable in upper and lower  respiratory specimens during the acute phase of infection. The lowest  concentration of SARS-CoV-2 viral copies this assay can detect is 250  copies / mL. A negative result does not preclude SARS-CoV-2 infection  and should not be used as the sole basis for treatment or other  patient management decisions.  A negative result may occur with  improper specimen collection / handling, submission of specimen other  than nasopharyngeal swab, presence of viral mutation(s) within the  areas targeted by this assay, and inadequate number of viral copies  (<250 copies / mL). A negative result must be combined with clinical  observations, patient history, and epidemiological information. If result is POSITIVE SARS-CoV-2 target nucleic acids are DETECTED. The SARS-CoV-2 RNA is generally detectable in upper and lower  respiratory specimens dur ing the acute  phase of infection.  Positive  results are indicative of active infection with SARS-CoV-2.  Clinical  correlation with patient history and other diagnostic information is  necessary to determine patient infection status.  Positive results do  not rule out bacterial infection or co-infection with other viruses. If result is PRESUMPTIVE POSTIVE SARS-CoV-2 nucleic acids MAY BE PRESENT.   A presumptive positive result was obtained on the submitted specimen  and confirmed on repeat testing.  While 2019 novel coronavirus  (SARS-CoV-2) nucleic acids may be present in the submitted sample  additional confirmatory testing may be necessary for epidemiological  and / or clinical management purposes  to differentiate between  SARS-CoV-2 and other Sarbecovirus currently known to infect humans.  If clinically indicated additional testing with an alternate test  methodology [id number]) is advised. The SARS-CoV-2 RNA is generally  detectable in upper and lower respiratory sp ecimens during the acute  phase of infection. The expected result is Negative. Fact Sheet for Patients:  StrictlyIdeas.no Fact Sheet for Healthcare Providers: BankingDealers.co.za This test is not yet approved or cleared by the [place] FDA and has been authorized for detection and/or diagnosis of SARS-CoV-2 by FDA under an Emergency Use Authorization (EUA).  This EUA will remain in effect (meaning this test can be used) for the duration of the COVID-19 declaration under Section 564(b)(1) of the Act, 21 U.S.C. section 360bbb-3(b)(1), unless the authorization is terminated or revoked sooner. Performed at [facility], [address] [place], [place] [address]    Ct Renal Joaquim Lai Study  Result Date: 04/26/2019 CLINICAL DATA:  Fever. Back pain. Indwelling bilateral nephrostomy tubes. EXAM: CT ABDOMEN AND PELVIS WITHOUT CONTRAST TECHNIQUE: Multidetector CT imaging of the  abdomen and pelvis was performed following the standard protocol without IV contrast. COMPARISON:  Noncontrast CT 04/08/2019. FINDINGS: Lower chest: Pleural effusion on prior exam have resolved. Decreased pericardial effusion, persistently measuring 11 mm adjacent to the right heart. Coronary artery calcifications. Hepatobiliary: No focal hepatic abnormality on noncontrast exam. Clips in the gallbladder fossa postcholecystectomy. No biliary dilatation. Pancreas: Parenchymal atrophy. No ductal dilatation or inflammation. Spleen: Normal in size without focal abnormality. Adrenals/Urinary Tract: Similar left adrenal thickening. Normal right adrenal gland. Right nephrostomy tube in place, tip in the renal pelvis. No fluid or inflammation along the nephrostomy tract. Decreased right hydronephrosis from prior exam. Persistent dilatation of the right renal collecting system and ureter to the level of the bladder. Resolved right perinephric stranding from prior. Simple cyst in the upper right kidney again seen. Left nephrostomy tube in place, tip in the renal pelvis. No abnormality along the nephrostomy tract. Minimal prominence of the left renal collecting system without frank hydronephrosis. Left ureter is prominent to the level of the bladder. Partially necrotic bladder tumor is not significantly changed from previous. Pelvic bowel loops again are intimately related to bladder mass. Stomach/Bowel: Small hiatal hernia. Stomach is decompressed. Duodenal diverticulum without inflammation. No small bowel obstruction. Pelvic small bowel loops are immediately related to necrotic bladder tumor. Normal appendix. Colonic tortuosity with moderate colonic stool burden. Stool distends the rectum. No colonic inflammation. Vascular/Lymphatic:  Advanced aortic atherosclerosis. Small retroperitoneal lymph nodes all subcentimeter short axis. No definite pelvic adenopathy, lack of contrast limits detailed assessment. Reproductive: Stable  prostate involvement by large bladder lesion. Other: Mild presacral edema, unchanged. No free fluid. No free air. Tiny fat containing umbilical hernia. Musculoskeletal: Diffuse bony under mineralization. Multilevel degenerative change throughout the spine. Advanced degenerative change of both hips. Intramedullary rod with trans trochanteric screw partially included. IMPRESSION: 1. Bilateral nephrostomy tubes in place. Mild right hydronephrosis which is improved from most recent CT. Slight prominence of the left renal collecting system which has slightly increased. 2. 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 3. Resolved right pleural effusion. Decreased pericardial effusion from prior. 4.  Aortic Atherosclerosis (ICD10-I70.0). Electronically Signed   By: Keith Rake M.D.   On: 04/26/2019 02:33    Pending Labs Unresulted Labs (From admission, onward)    Start     Ordered   04/26/19 0119  Lactic acid, plasma  Now then every 2 hours,   STAT     04/26/19 0119   04/26/19 0119  Blood Culture (routine x 2)  BLOOD CULTURE X 2,   STAT     04/26/19 0119   04/26/19 0119  Urine culture  ONCE - STAT,   STAT     04/26/19 0119   Signed and Held  Basic metabolic panel  Tomorrow morning,   R     Signed and Held   Signed and Held  CBC  Tomorrow morning,   R     Signed and Held          Vitals/Pain Today's Vitals   04/26/19 0117 04/26/19 0123 04/26/19 0130 04/26/19 0142  BP:      Pulse:   (!) 117   Resp:  (!) 25    Temp:    (!) 101.8 F (38.8 C)  TempSrc:    Rectal  SpO2:   99%   PainSc: 0-No pain       Isolation Precautions No active isolations  Medications Medications  vancomycin (VANCOCIN) IVPB 1000 mg/200 mL premix (has no administration in time range)  ceFEPIme (MAXIPIME) 2 g in sodium chloride 0.9 % 100 mL IVPB (2 g Intravenous Bolus 04/26/19 0145)    Mobility walks with person assist High fall risk   Focused Assessments Sepsis   R Recommendations: See  Admitting Provider Note  Report given to:   Additional Notes:

## 2019-04-26 NOTE — ED Triage Notes (Signed)
Pt to ED via EMS from home with c/o right leg pain from operation 1 yr ago. Per ems they were called out for chronic back pain. Pt arrives tachycardiac at 123. Pt has bilateral nephrostomy tubes. Pt states he is here because he cant walk.

## 2019-04-26 NOTE — Progress Notes (Signed)
Pharmacy Antibiotic Note  Vincent Poole is a 84 y.o. male admitted on 04/26/2019 with sepsis and UTI.  Pharmacy has been consulted for Vancomycin and Cefepime dosing.  Plan: Cefepime 2g IV q12h  Vancomycin 1000 mg IV Q 24 hrs. Goal AUC 400-550. Expected AUC: 437 SCr used: 1.22 Expected Cmin: 10.4   Weight: 151 lb (68.5 kg)  Temp (24hrs), Avg:99.1 F (37.3 C), Min:97.5 F (36.4 C), Max:101.8 F (38.8 C)  Recent Labs  Lab 04/23/19 0932 04/26/19 0133 04/26/19 0320  WBC 26.7* 25.9*  --   CREATININE 1.23 1.22  --   LATICACIDVEN  --  2.0* 0.9    Estimated Creatinine Clearance: 46 mL/min (by C-G formula based on SCr of 1.22 mg/dL).    No Known Allergies  Antimicrobials this admission: Vancomycin 6/19 >>  Cefepime 6/19 >>   Thank you for allowing pharmacy to be a part of this patient's care.  Paulina Fusi, PharmD, BCPS 04/26/2019 4:37 AM

## 2019-04-26 NOTE — Progress Notes (Signed)
PHARMACY - PHYSICIAN COMMUNICATION CRITICAL VALUE ALERT - BLOOD CULTURE IDENTIFICATION (BCID)  Vincent Poole is an 84 y.o. male who presented to [facility] on 04/26/2019 with a chief complaint of lower back pain  Assessment:  Tmax 101.8 (06/18 @ 2300), tachycardic, hypotensive 90 - 100's, WBC 24.7 >> 26.7 >> 25.9 >> 23.6, LA 2.0 >> 0.9 >> 0.8, UA leuk +, bacteria many, 1/4 GPC BCID Staph species MecA+, patient has two nephrostomy tubes that have been there for 32 days, and two other nephrostomy tubes for 7 days.  CT renal: 1. Bilateral nephrostomy tubes in place. Mild right hydronephrosis which is improved from most recent CT. Slight prominence of the left renal collecting system which has slightly increased. 2. 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 3. Resolved right pleural effusion. Decreased pericardial effusion from prior.  Name of physician (or Provider) Contacted: Gardiner Barefoot  Current antibiotics: Vanc/cefepime  Changes to prescribed antibiotics recommended:  Recommendations accepted by provider -- Will discontinue cefepime and continue vancomycin as patient may have an MRSA infx s/t to nephrostomy tube placement and/or necrotic bladder tumor.  Results for orders placed or performed during the hospital encounter of 04/26/19  Blood Culture ID Panel (Reflexed) (Collected: 04/26/2019  1:33 AM)  Result Value Ref Range   Enterococcus species NOT DETECTED NOT DETECTED   Listeria monocytogenes NOT DETECTED NOT DETECTED   Staphylococcus species DETECTED (A) NOT DETECTED   Staphylococcus aureus (BCID) NOT DETECTED NOT DETECTED   Methicillin resistance DETECTED (A) NOT DETECTED   Streptococcus species NOT DETECTED NOT DETECTED   Streptococcus agalactiae NOT DETECTED NOT DETECTED   Streptococcus pneumoniae NOT DETECTED NOT DETECTED   Streptococcus pyogenes NOT DETECTED NOT DETECTED   Acinetobacter baumannii NOT DETECTED NOT DETECTED   Enterobacteriaceae  species NOT DETECTED NOT DETECTED   Enterobacter cloacae complex NOT DETECTED NOT DETECTED   Escherichia coli NOT DETECTED NOT DETECTED   Klebsiella oxytoca NOT DETECTED NOT DETECTED   Klebsiella pneumoniae NOT DETECTED NOT DETECTED   Proteus species NOT DETECTED NOT DETECTED   Serratia marcescens NOT DETECTED NOT DETECTED   Haemophilus influenzae NOT DETECTED NOT DETECTED   Neisseria meningitidis NOT DETECTED NOT DETECTED   Pseudomonas aeruginosa NOT DETECTED NOT DETECTED   Candida albicans NOT DETECTED NOT DETECTED   Candida glabrata NOT DETECTED NOT DETECTED   Candida krusei NOT DETECTED NOT DETECTED   Candida parapsilosis NOT DETECTED NOT DETECTED   Candida tropicalis NOT DETECTED NOT DETECTED   Tobie Lords, PharmD, BCPS Clinical Pharmacist 04/26/2019  10:45 PM

## 2019-04-26 NOTE — Progress Notes (Signed)
CODE SEPSIS - PHARMACY COMMUNICATION  **Broad Spectrum Antibiotics should be administered within 1 hour of Sepsis diagnosis**  Time Code Sepsis Called/Page Received:  6/19 @ 0119   Antibiotics Ordered:  Cefepime   Time of 1st antibiotic administration:  6/19 @ 0145   Additional action taken by pharmacy:   If necessary, Name of Provider/Nurse Contacted:     Nylah Butkus D ,PharmD Clinical Pharmacist  04/26/2019  1:46 AM

## 2019-04-26 NOTE — Consult Note (Signed)
Urology Consult  I have been asked to see the patient by Dr. Christel Mormon, for evaluation and management of sepsis secondary to UTI and history of urinary bladder and prostate cancer.  Chief Complaint: Sepsis  History of Present Illness: Vincent Poole is a 84 y.o. year old male with bilateral ureteral obstruction due to locally advanced bladder cancer who has been admitted due to sepsis secondary to UTI.    Vincent Poole is hard of hearing and with slight dementia, so some of the history is obtained from the chart.  He presented to the ED yesterday with a HR of 120, respiratory rate of 22-24 and a temp of 101.8.  Abdominal and pelvic CT noted bilateral nephrostomy tubes in place. Mild right hydronephrosis which is improved from most recent CT. Slight prominence of the left renal collecting system which has slightly increased.  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  WBC count 23.6, serum creatinine 1.17 and UA 21-50 RBC's, > 50 WBC's and many bacteria.    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  He was treated with chemo and radiation after TURP/TURBT and had been on palliative Tecentriq.  Tecentriq is currently on hold.    He has no complaints this morning.   He states he slept well.   He is afebrile this morning.  Bilateral nephrostomy tubes are in place.  Right nephrostomy tube is amber colored urine and the left nephrostomy tube is draining clear yellow urine.  Good UOP is noted.     Past Medical History:  Diagnosis Date  . Anxiety   . Arthritis   . Bladder cancer ([place])   . Dysrhythmia 07/2018   history of atrial flutter that worsens with anxiety  . Femur fracture, right ([place]) 05/01/2018  . GERD (gastroesophageal reflux disease)   . History of recent blood transfusion 05/2018  . Hypertension   . Iron deficiency anemia 09/14/2018  . Prostate cancer ([place]) 07/2018   cancer  growing in prostate but not prostate cancer, it is from the bladder  . Umbilical hernia 93/2355  . Urinary retention 2019   foley catheter place 11/2017  . UTI (urinary tract infection) 2019   frequent UTI's over last year  . Wound eschar of foot 07/2018   left heal getting wrapped and requiring antibiotic cream. cracks open with weight bearing.    Past Surgical History:  Procedure Laterality Date  . CARPAL TUNNEL RELEASE Right   . CHOLECYSTECTOMY  2004  . CYSTOGRAM  07/18/2018   Procedure: CYSTOGRAM;  Surgeon: [name], MD;  Location: [facility];  Service: Urology;;  . Consuela Mimes W/ RETROGRADES Bilateral 07/18/2018   Procedure: CYSTOSCOPY WITH RETROGRADE PYELOGRAM;  Surgeon: [name], MD;  Location: [facility];  Service: Urology;  Laterality: Bilateral;  . CYSTOSCOPY W/ URETERAL STENT PLACEMENT Bilateral 12/27/2017   Procedure: CYSTOSCOPY WITH RETROGRADE PYELOGRAM/URETERAL STENT PLACEMENT;  Surgeon: [name], MD;  Location: [facility];  Service: Urology;  Laterality: Bilateral;  . CYSTOSCOPY W/ URETERAL STENT PLACEMENT Bilateral 07/18/2018   Procedure: CYSTOSCOPY WITH STENT REPLACEMENT (exchange);  Surgeon: [name], MD;  Location: [facility];  Service: Urology;  Laterality: Bilateral;  . CYSTOSCOPY WITH STENT PLACEMENT Bilateral 11/12/2018   Procedure: [place] WITH STENT Exchange;  Surgeon: [name], MD;  Location: [facility];  Service: Urology;  Laterality: Bilateral;  . INTRAMEDULLARY (IM) NAIL INTERTROCHANTERIC Right 05/02/2018   Procedure: INTRAMEDULLARY (IM) NAIL  INTERTROCHANTRIC;  Surgeon: [name], MD;  Location: [facility];  Service: Orthopedics;  Laterality: Right;  . IR NEPHROSTOMY EXCHANGE LEFT  03/25/2019  . IR NEPHROSTOMY EXCHANGE LEFT  04/19/2019  . IR NEPHROSTOMY EXCHANGE RIGHT  03/25/2019  . IR NEPHROSTOMY EXCHANGE RIGHT  04/19/2019  . IR NEPHROSTOMY PLACEMENT LEFT  02/23/2019  . IR NEPHROSTOMY PLACEMENT RIGHT  02/23/2019  . LEG TENDON SURGERY  Right 1958  . PORTA CATH INSERTION N/A 01/22/2018   Procedure: PORTA CATH INSERTION;  Surgeon: [name], MD;  Location: [facility];  Service: Cardiovascular;  Laterality: N/A;  . TRANSURETHRAL RESECTION OF BLADDER TUMOR N/A 12/27/2017   Procedure: TRANSURETHRAL RESECTION OF BLADDER TUMOR (TURBT);  Surgeon: [name], MD;  Location: [facility];  Service: Urology;  Laterality: N/A;  . TRANSURETHRAL RESECTION OF BLADDER TUMOR N/A 01/15/2018   Procedure: TRANSURETHRAL RESECTION OF BLADDER TUMOR (TURBT);  Surgeon: [name], MD;  Location: [facility];  Service: Urology;  Laterality: N/A;  Need 2 hrs for this case please  . TRANSURETHRAL RESECTION OF BLADDER TUMOR N/A 07/18/2018   Procedure: TRANSURETHRAL RESECTION OF BLADDER TUMOR (TURBT);  Surgeon: [name], MD;  Location: [facility];  Service: Urology;  Laterality: N/A;    Home Medications:    Allergies: No Known Allergies  Family History  Problem Relation Age of Onset  . Cancer Mother   . Chronic Renal Failure Mother   . Heart disease Father     Social History:  reports that he has never smoked. He has never used smokeless tobacco. He reports that he does not drink alcohol or use drugs.  ROS: A complete review of systems was performed.  All systems are negative except for pertinent findings as noted.  Physical Exam:  Vital signs in last 24 hours: Temp:  [97.5 F (36.4 C)-101.8 F (38.8 C)] 98 F (36.7 C) (06/19 0419) Pulse Rate:  [99-120] 100 (06/19 0419) Resp:  [24-25] 24 (06/19 0325) BP: (105-132)/(55-79) 114/55 (06/19 0419) SpO2:  [96 %-100 %] 100 % (06/19 0419) Weight:  [68.5 kg] 68.5 kg (06/19 0419) Constitutional:  Alert and oriented, No acute distress.  Very hard of hearing.   HEENT: [place] AT, moist mucus membranes.  Trachea midline, no masses Cardiovascular: Regular rate and rhythm, no clubbing, cyanosis, or edema. Respiratory: Normal respiratory effort, lungs clear bilaterally GI: Abdomen is soft,  nontender, nondistended, no abdominal masses.  GU: No CVA tenderness.  Nephrostomy tubes in place bilaterally.  Dressings are clean and dry.   Skin: No rashes, bruises or suspicious lesions.  Decubitus dressing in place.  Psychiatric: Normal mood and affect   Laboratory Data:  Recent Labs    04/23/19 0932 04/26/19 0133 04/26/19 0443  WBC 26.7* 25.9* 23.6*  HGB 8.8* 8.9* 7.9*  HCT 27.8* 27.9* 25.2*   Recent Labs    04/23/19 0932 04/26/19 0133 04/26/19 0443  NA 133* 132* 133*  K 4.2 4.0 3.3*  CL 98 98 101  CO2 22 24 25   GLUCOSE 140* 136* 141*  BUN 23 19 19   CREATININE 1.23 1.22 1.17  CALCIUM 8.6* 8.6* 8.2*   No results for input(s): LABPT, INR in the last 72 hours. No results for input(s): LABURIN in the last 72 hours. Results for orders placed or performed during the hospital encounter of 04/26/19  Blood Culture (routine x 2)     Status: None (Preliminary result)   Collection Time: 04/26/19  1:33 AM   Specimen: BLOOD  Result Value Ref Range Status  Specimen Description BLOOD BLOOD RIGHT FOREARM  Final   Special Requests   Final    BOTTLES DRAWN AEROBIC AND ANAEROBIC Blood Culture results may not be optimal due to an excessive volume of blood received in culture bottles   Culture  Setup Time PENDING  Incomplete   Culture   Final    NO GROWTH < 12 HOURS Performed at [facility], [address], [place], [place] [address]    Report Status PENDING  Incomplete  Blood Culture (routine x 2)     Status: None (Preliminary result)   Collection Time: 04/26/19  1:33 AM   Specimen: BLOOD  Result Value Ref Range Status   Specimen Description BLOOD BLOOD LEFT FOREARM  Final   Special Requests   Final    BOTTLES DRAWN AEROBIC AND ANAEROBIC Blood Culture adequate volume   Culture   Final    NO GROWTH < 12 HOURS Performed at [facility], [address] [place], [place] [address]    Report Status PENDING  Incomplete  SARS Coronavirus 2 (CEPHEID -  Performed in [place] hospital lab), Hosp Order     Status: None   Collection Time: 04/26/19  1:33 AM   Specimen: Nasopharyngeal Swab  Result Value Ref Range Status   SARS Coronavirus 2 NEGATIVE NEGATIVE Final    Comment: (NOTE) If result is NEGATIVE SARS-CoV-2 target nucleic acids are NOT DETECTED. The SARS-CoV-2 RNA is generally detectable in upper and lower  respiratory specimens during the acute phase of infection. The lowest  concentration of SARS-CoV-2 viral copies this assay can detect is 250  copies / mL. A negative result does not preclude SARS-CoV-2 infection  and should not be used as the sole basis for treatment or other  patient management decisions.  A negative result may occur with  improper specimen collection / handling, submission of specimen other  than nasopharyngeal swab, presence of viral mutation(s) within the  areas targeted by this assay, and inadequate number of viral copies  (<250 copies / mL). A negative result must be combined with clinical  observations, patient history, and epidemiological information. If result is POSITIVE SARS-CoV-2 target nucleic acids are DETECTED. The SARS-CoV-2 RNA is generally detectable in upper and lower  respiratory specimens dur ing the acute phase of infection.  Positive  results are indicative of active infection with SARS-CoV-2.  Clinical  correlation with patient history and other diagnostic information is  necessary to determine patient infection status.  Positive results do  not rule out bacterial infection or co-infection with other viruses. If result is PRESUMPTIVE POSTIVE SARS-CoV-2 nucleic acids MAY BE PRESENT.   A presumptive positive result was obtained on the submitted specimen  and confirmed on repeat testing.  While 2019 novel coronavirus  (SARS-CoV-2) nucleic acids may be present in the submitted sample  additional confirmatory testing may be necessary for epidemiological  and / or clinical management  purposes  to differentiate between  SARS-CoV-2 and other Sarbecovirus currently known to infect humans.  If clinically indicated additional testing with an alternate test  methodology [phone]) is advised. The SARS-CoV-2 RNA is generally  detectable in upper and lower respiratory sp ecimens during the acute  phase of infection. The expected result is Negative. Fact Sheet for Patients:  StrictlyIdeas.no Fact Sheet for Healthcare Providers: BankingDealers.co.za This test is not yet approved or cleared by the [place] FDA and has been authorized for detection and/or diagnosis of SARS-CoV-2 by FDA under an Emergency Use Authorization (EUA).  This EUA will remain in effect (meaning this test can be used) for the duration of the COVID-19 declaration under Section 564(b)(1) of the Act, 21 U.S.C. section 360bbb-3(b)(1), unless the authorization is terminated or revoked sooner. Performed at [facility], [address], [place], [place] [address]      Radiologic Imaging: Ct Renal Stone Study  Result Date: 04/26/2019 CLINICAL DATA:  Fever. Back pain. Indwelling bilateral nephrostomy tubes. EXAM: CT ABDOMEN AND PELVIS WITHOUT CONTRAST TECHNIQUE: Multidetector CT imaging of the abdomen and pelvis was performed following the standard protocol without IV contrast. COMPARISON:  Noncontrast CT 04/08/2019. FINDINGS: Lower chest: Pleural effusion on prior exam have resolved. Decreased pericardial effusion, persistently measuring 11 mm adjacent to the right heart. Coronary artery calcifications. Hepatobiliary: No focal hepatic abnormality on noncontrast exam. Clips in the gallbladder fossa postcholecystectomy. No biliary dilatation. Pancreas: Parenchymal atrophy. No ductal dilatation or inflammation. Spleen: Normal in size without focal abnormality. Adrenals/Urinary Tract: Similar left adrenal thickening. Normal right adrenal gland. Right  nephrostomy tube in place, tip in the renal pelvis. No fluid or inflammation along the nephrostomy tract. Decreased right hydronephrosis from prior exam. Persistent dilatation of the right renal collecting system and ureter to the level of the bladder. Resolved right perinephric stranding from prior. Simple cyst in the upper right kidney again seen. Left nephrostomy tube in place, tip in the renal pelvis. No abnormality along the nephrostomy tract. Minimal prominence of the left renal collecting system without frank hydronephrosis. Left ureter is prominent to the level of the bladder. Partially necrotic bladder tumor is not significantly changed from previous. Pelvic bowel loops again are intimately related to bladder mass. Stomach/Bowel: Small hiatal hernia. Stomach is decompressed. Duodenal diverticulum without inflammation. No small bowel obstruction. Pelvic small bowel loops are immediately related to necrotic bladder tumor. Normal appendix. Colonic tortuosity with moderate colonic stool burden. Stool distends the rectum. No colonic inflammation. Vascular/Lymphatic: Advanced aortic atherosclerosis. Small retroperitoneal lymph nodes all subcentimeter short axis. No definite pelvic adenopathy, lack of contrast limits detailed assessment. Reproductive: Stable prostate involvement by large bladder lesion. Other: Mild presacral edema, unchanged. No free fluid. No free air. Tiny fat containing umbilical hernia. Musculoskeletal: Diffuse bony under mineralization. Multilevel degenerative change throughout the spine. Advanced degenerative change of both hips. Intramedullary rod with trans trochanteric screw partially included. IMPRESSION: 1. Bilateral nephrostomy tubes in place. Mild right hydronephrosis which is improved from most recent CT. Slight prominence of the left renal collecting system which has slightly increased. 2. 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 3. Resolved right pleural  effusion. Decreased pericardial effusion from prior. 4.  Aortic Atherosclerosis (ICD10-I70.0). Electronically Signed   By: Keith Rake M.D.   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  Plan:  Continue broad spectrum antibiotics - urine and blood cultures are pending - leukocytosis appears to be chronic with unclear etiology and is likely cancer related Nephrostomy tubes in place with good UOP - recently exchanged on 04/19/2019 Will scan bladder to ensure adequate emptying, if PVR > 200 cc recommend straight cathing Due to advanced age and advanced disease he is not surgical candidate, so will continue with palliative approach concerning his care   04/26/2019, 7:49 AM  Zara Council, PA-C

## 2019-04-26 NOTE — ED Notes (Signed)
Patient transported to CT 

## 2019-04-26 NOTE — Progress Notes (Signed)
Pt requested his wife, Keldric Poyer be contacted to let her know he was admitted. Wife updated on status and care plan, password set up.

## 2019-04-26 NOTE — ED Provider Notes (Addendum)
[facility] Emergency Department Provider Note _   First MD Initiated Contact with Patient 04/26/19 0118     (approximate)  I have reviewed the triage vital signs and the nursing notes.   HISTORY  Chief Complaint Back Pain   HPI Vincent Poole is a 84 y.o. male below list of previous medical conditions including bilateral nephrostomy tubes presents to the emergency department via EMS secondary to back pain.  EMS noted patient to be tachycardic hypotensive and subjective fever while in route.  Patient's temperature 101.8 heart rate 120 tachypneic at 25 on arrival to the emergency department.  Patient denies any complaints.       Past Medical History:  Diagnosis Date   Anxiety    Arthritis    Bladder cancer ([place])    Dysrhythmia 07/2018   history of atrial flutter that worsens with anxiety   Femur fracture, right ([place]) 05/01/2018   GERD (gastroesophageal reflux disease)    History of recent blood transfusion 05/2018   Hypertension    Iron deficiency anemia 09/14/2018   Prostate cancer ([place]) 07/2018   cancer growing in prostate but not prostate cancer, it is from the bladder   Umbilical hernia 43/1540   Urinary retention 2019   foley catheter place 11/2017   UTI (urinary tract infection) 2019   frequent UTI's over last year   Wound eschar of foot 07/2018   left heal getting wrapped and requiring antibiotic cream. cracks open with weight bearing.    Patient Active Problem List   Diagnosis Date Noted   Hydronephrosis, right 04/08/2019   Pressure injury of skin 03/23/2019   SIRS (systemic inflammatory response syndrome) ([place]) 03/22/2019   Leukocytosis 08/67/6195   Complicated UTI (urinary tract infection) 11/14/2018   Palliative care encounter    Iron deficiency anemia 09/14/2018   Hip fracture ([place]) 05/01/2018   Malignant neoplasm of urinary bladder ([place]) 01/12/2018   Goals of care, counseling/discussion 01/12/2018    Pyelonephritis 05/31/2017   UTI (urinary tract infection) 03/04/2017   Urinary obstruction    Essential hypertension 08/30/2016   History of shingles 08/30/2016   Prostate cancer ([place]) 08/30/2016   Urinary retention 08/30/2016   Medicare annual wellness visit, initial 08/01/2016   Medicare annual wellness visit, subsequent 08/01/2016   Sepsis ([place]) 07/11/2016   Borderline diabetes mellitus 01/26/2016   Vaccine counseling 01/26/2015   Lumbar radiculitis 06/24/2014   OA (osteoarthritis) of hip 06/24/2014   Malignant neoplasm of prostate ([place]) 01/27/2011    Past Surgical History:  Procedure Laterality Date   CARPAL TUNNEL RELEASE Right    CHOLECYSTECTOMY  2004   CYSTOGRAM  07/18/2018   Procedure: CYSTOGRAM;  Surgeon: [name], MD;  Location: [facility];  Service: Urology;;   Consuela Mimes W/ RETROGRADES Bilateral 07/18/2018   Procedure: CYSTOSCOPY WITH RETROGRADE PYELOGRAM;  Surgeon: [name], MD;  Location: [facility];  Service: Urology;  Laterality: Bilateral;   CYSTOSCOPY W/ URETERAL STENT PLACEMENT Bilateral 12/27/2017   Procedure: CYSTOSCOPY WITH RETROGRADE PYELOGRAM/URETERAL STENT PLACEMENT;  Surgeon: [name], MD;  Location: [facility];  Service: Urology;  Laterality: Bilateral;   CYSTOSCOPY W/ URETERAL STENT PLACEMENT Bilateral 07/18/2018   Procedure: CYSTOSCOPY WITH STENT REPLACEMENT (exchange);  Surgeon: [name], MD;  Location: [facility];  Service: Urology;  Laterality: Bilateral;   CYSTOSCOPY WITH STENT PLACEMENT Bilateral 11/12/2018   Procedure: [place] WITH STENT Exchange;  Surgeon: [name], MD;  Location: [facility];  Service: Urology;  Laterality: Bilateral;   INTRAMEDULLARY (IM) NAIL INTERTROCHANTERIC  Right 05/02/2018   Procedure: INTRAMEDULLARY (IM) NAIL INTERTROCHANTRIC;  Surgeon: [name], MD;  Location: [facility];  Service: Orthopedics;  Laterality: Right;   IR NEPHROSTOMY EXCHANGE LEFT  03/25/2019   IR NEPHROSTOMY  EXCHANGE LEFT  04/19/2019   IR NEPHROSTOMY EXCHANGE RIGHT  03/25/2019   IR NEPHROSTOMY EXCHANGE RIGHT  04/19/2019   IR NEPHROSTOMY PLACEMENT LEFT  02/23/2019   IR NEPHROSTOMY PLACEMENT RIGHT  02/23/2019   LEG TENDON SURGERY Right 1958   PORTA CATH INSERTION N/A 01/22/2018   Procedure: PORTA CATH INSERTION;  Surgeon: [name], MD;  Location: [facility];  Service: Cardiovascular;  Laterality: N/A;   TRANSURETHRAL RESECTION OF BLADDER TUMOR N/A 12/27/2017   Procedure: TRANSURETHRAL RESECTION OF BLADDER TUMOR (TURBT);  Surgeon: [name], MD;  Location: [facility];  Service: Urology;  Laterality: N/A;   TRANSURETHRAL RESECTION OF BLADDER TUMOR N/A 01/15/2018   Procedure: TRANSURETHRAL RESECTION OF BLADDER TUMOR (TURBT);  Surgeon: [name], MD;  Location: [facility];  Service: Urology;  Laterality: N/A;  Need 2 hrs for this case please   TRANSURETHRAL RESECTION OF BLADDER TUMOR N/A 07/18/2018   Procedure: TRANSURETHRAL RESECTION OF BLADDER TUMOR (TURBT);  Surgeon: [name], MD;  Location: [facility];  Service: Urology;  Laterality: N/A;    Prior to Admission medications   Medication Sig Start Date End Date Taking? Authorizing Provider  acetaminophen (TYLENOL) 500 MG tablet Take 1,000 mg by mouth every 6 (six) hours as needed for moderate pain or headache.     [provider]  cetirizine (ZYRTEC) 10 MG tablet Take 10 mg by mouth daily as needed for allergies.     [provider]  Cranberry-Cholecalciferol (SUPER CRANBERRY/VITAMIN D3) 4200-500 MG-UNIT CAPS Take 1 capsule by mouth 2 (two) times daily.    [provider]  ELIQUIS 5 MG TABS tablet TAKE ONE TABLET BY MOUTH TWICE DAILY Patient taking differently: Take 5 mg by mouth 2 (two) times daily.  01/30/19   Cammie Sickle, MD  ferrous sulfate 325 (65 FE) MG tablet Take 1 tablet (325 mg total) by mouth 2 (two) times daily with a meal. 05/06/18   Gouru, Aruna, MD  lidocaine-prilocaine  (EMLA) cream Apply 1 application topically as needed. 01/02/19   Sindy Guadeloupe, MD  Multiple Vitamin (MULTIVITAMIN WITH MINERALS) TABS tablet Take 1 tablet by mouth daily.    [provider]  MYRBETRIQ 50 MG TB24 tablet TAKE ONE TABLET EVERY DAY Patient taking differently: Take 50 mg by mouth daily.  01/02/19   Zara Council A, PA-C  nystatin [facility]) powder Apply topically 2 (two) times daily. 09/28/18   Zara Council A, PA-C  oxyCODONE (OXY IR/ROXICODONE) 5 MG immediate release tablet Take 1 tablet (5 mg total) by mouth every 4 (four) hours as needed for severe pain. 03/21/19   Sindy Guadeloupe, MD  polyethylene glycol powder (MIRALAX) powder Take 17 g by mouth daily as needed. Can increase to 3 times a day as needed for constipation but hold medication if has diarrhea 01/30/18   Sindy Guadeloupe, MD  potassium chloride SA (K-DUR) 20 MEQ tablet Take 20 mEq by mouth 2 (two) times daily.    [provider]  vitamin B-12 (CYANOCOBALAMIN) 500 MCG tablet Take 500 mcg by mouth daily.    [provider]  zinc oxide 20 % ointment Apply 1 application topically as needed for irritation.    [provider]    Allergies Patient has no known allergies.  Family History  Problem Relation Age of Onset   Cancer Mother    Chronic Renal Failure Mother    Heart disease Father     Social History Social History   Tobacco Use   Smoking status: Never Smoker   Smokeless tobacco: Never Used  Substance Use Topics   Alcohol use: No    Alcohol/week: 0.0 standard drinks   Drug use: No    Review of Systems Constitutional: Positive for fever Eyes: No visual changes. ENT: No sore throat. Cardiovascular: Denies chest pain. Respiratory: Denies shortness of breath. Gastrointestinal: No abdominal pain.  No nausea, no vomiting.  No diarrhea.  No constipation. Genitourinary: Negative for dysuria. Musculoskeletal: Negative for neck pain.  Positive for reported back  pain. Integumentary: Negative for rash. Neurological: Negative for headaches, focal weakness or numbness.  ____________________________________________   PHYSICAL EXAM:  VITAL SIGNS: ED Triage Vitals  Enc Vitals Group     BP 04/26/19 0115 132/72     Pulse Rate 04/26/19 0116 (!) 120     Resp 04/26/19 0123 (!) 25     Temp 04/26/19 0116 (!) 97.5 F (36.4 C)     Temp Source 04/26/19 0116 Oral     SpO2 04/26/19 0116 97 %     Weight --      Height --      Head Circumference --      Peak Flow --      Pain Score 04/26/19 0117 0     Pain Loc --      Pain Edu? --      Excl. in [place]? --     Constitutional: Alert and  Ill-appearing Eyes: Conjunctivae are normal.  Head: Atraumatic. Nose: No congestion/rhinnorhea. Mouth/Throat: Mucous membranes are moist.  Oropharynx non-erythematous. Neck: No stridor.  No meningeal signs.  Cardiovascular: Normal rate, regular rhythm. Good peripheral circulation. Grossly normal heart sounds. Respiratory: Normal respiratory effort.  No retractions. No audible wheezing. Gastrointestinal: Soft and nontender. No distention.  Musculoskeletal: No lower extremity tenderness nor edema. No gross deformities of extremities. Neurologic:  Normal speech and language. No gross focal neurologic deficits are appreciated.  Skin:  Skin is hot to touch, dry and intact. No rash noted. Psychiatric: Mood and affect are normal. Speech and behavior are normal.  ____________________________________________   LABS (all labs ordered are listed, but only abnormal results are displayed)  Labs Reviewed  LACTIC ACID, PLASMA - Abnormal; Notable for the following components:      Result Value   Lactic Acid, Venous 2.0 (*)    All other components within normal limits  COMPREHENSIVE METABOLIC PANEL - Abnormal; Notable for the following components:   Sodium 132 (*)    Glucose, Bld 136 (*)    Calcium 8.6 (*)    Albumin 2.6 (*)    AST 13 (*)    GFR calc non Af Amer 55 (*)     All other components within normal limits  CBC WITH DIFFERENTIAL/PLATELET - Abnormal; Notable for the following components:   WBC 25.9 (*)    RBC 2.99 (*)    Hemoglobin 8.9 (*)    HCT 27.9 (*)    Neutro Abs 22.0 (*)    Monocytes Absolute 1.9 (*)    Abs Immature Granulocytes 0.33 (*)    All other components within normal limits  URINALYSIS, ROUTINE W REFLEX MICROSCOPIC - Abnormal; Notable for the following components:   Color, Urine YELLOW (*)    APPearance CLOUDY (*)    Hgb urine dipstick  MODERATE (*)    Protein, ur 100 (*)    Leukocytes,Ua LARGE (*)    WBC, UA >50 (*)    Bacteria, UA MANY (*)    All other components within normal limits  SARS CORONAVIRUS 2 (HOSPITAL ORDER, [place] [facility])  CULTURE, BLOOD (ROUTINE X 2)  CULTURE, BLOOD (ROUTINE X 2)  URINE CULTURE  LACTIC ACID, PLASMA   ____________________________________________  EKG ED ECG REPORT I, [place] Bernita Beckstrom, the attending physician, personally viewed and interpreted this ECG.   Date: 04/26/2019  EKG Time: 1:47 AM  Rate: 111  Rhythm: Sinus tachycardia  Axis: Normal  Intervals: Normal  ST&T Change: None  ____________________________________  RADIOLOGY I, Gordon Ernst Bowler, personally viewed and evaluated these images (plain radiographs) as part of my medical decision making, as well as reviewing the written report by the radiologist.  ED MD interpretation:    Official radiology report(s): Ct Renal Stone Study  Result Date: 04/26/2019 CLINICAL DATA:  Fever. Back pain. Indwelling bilateral nephrostomy tubes. EXAM: CT ABDOMEN AND PELVIS WITHOUT CONTRAST TECHNIQUE: Multidetector CT imaging of the abdomen and pelvis was performed following the standard protocol without IV contrast. COMPARISON:  Noncontrast CT 04/08/2019. FINDINGS: Lower chest: Pleural effusion on prior exam have resolved. Decreased pericardial effusion, persistently measuring 11 mm adjacent to the right heart. Coronary artery  calcifications. Hepatobiliary: No focal hepatic abnormality on noncontrast exam. Clips in the gallbladder fossa postcholecystectomy. No biliary dilatation. Pancreas: Parenchymal atrophy. No ductal dilatation or inflammation. Spleen: Normal in size without focal abnormality. Adrenals/Urinary Tract: Similar left adrenal thickening. Normal right adrenal gland. Right nephrostomy tube in place, tip in the renal pelvis. No fluid or inflammation along the nephrostomy tract. Decreased right hydronephrosis from prior exam. Persistent dilatation of the right renal collecting system and ureter to the level of the bladder. Resolved right perinephric stranding from prior. Simple cyst in the upper right kidney again seen. Left nephrostomy tube in place, tip in the renal pelvis. No abnormality along the nephrostomy tract. Minimal prominence of the left renal collecting system without frank hydronephrosis. Left ureter is prominent to the level of the bladder. Partially necrotic bladder tumor is not significantly changed from previous. Pelvic bowel loops again are intimately related to bladder mass. Stomach/Bowel: Small hiatal hernia. Stomach is decompressed. Duodenal diverticulum without inflammation. No small bowel obstruction. Pelvic small bowel loops are immediately related to necrotic bladder tumor. Normal appendix. Colonic tortuosity with moderate colonic stool burden. Stool distends the rectum. No colonic inflammation. Vascular/Lymphatic: Advanced aortic atherosclerosis. Small retroperitoneal lymph nodes all subcentimeter short axis. No definite pelvic adenopathy, lack of contrast limits detailed assessment. Reproductive: Stable prostate involvement by large bladder lesion. Other: Mild presacral edema, unchanged. No free fluid. No free air. Tiny fat containing umbilical hernia. Musculoskeletal: Diffuse bony under mineralization. Multilevel degenerative change throughout the spine. Advanced degenerative change of both hips.  Intramedullary rod with trans trochanteric screw partially included. IMPRESSION: 1. Bilateral nephrostomy tubes in place. Mild right hydronephrosis which is improved from most recent CT. Slight prominence of the left renal collecting system which has slightly increased. 2. 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 3. Resolved right pleural effusion. Decreased pericardial effusion from prior. 4.  Aortic Atherosclerosis (ICD10-I70.0). Electronically Signed   By: Keith Rake M.D.   On: 04/26/2019 02:33     .Critical Care Performed by: Gregor Hams, MD Authorized by: Gregor Hams, MD   Critical care provider statement:    Critical care time (minutes):  30   Critical care time was exclusive of:  Separately billable procedures and treating other patients   Critical care was necessary to treat or prevent imminent or life-threatening deterioration of the following conditions:  Sepsis   Critical care was time spent personally by me on the following activities:  Development of treatment plan with patient or surrogate, discussions with consultants, evaluation of patient's response to treatment, examination of patient, obtaining history from patient or surrogate, ordering and performing treatments and interventions, ordering and review of laboratory studies, ordering and review of radiographic studies, pulse oximetry, re-evaluation of patient's condition and review of old charts     ____________________________________________   [place] / MDM / [place] / ED COURSE  As part of my medical decision making, I reviewed the following data within the electronic MEDICAL RECORD NUMBER   84 year old male presented with above-stated history and physical exam concerning for possible sepsis most likely of genitourinary etiology.  Patient febrile tachycardic on arrival as well as tachypnea.  Leukocytosis with white blood cell count of 25.9.  Patient's lactic acid  2.0.  Patient received appropriate IV antibiotic therapy with cefepime.  Patient discussed with Dr.Mansy who will admit the patient for further evaluation and management.  *Vincent Poole was evaluated in Emergency Department on 04/26/2019 for the symptoms described in the history of present illness. He was evaluated in the context of the global COVID-19 pandemic, which necessitated consideration that the patient might be at risk for infection with the SARS-CoV-2 virus that causes COVID-19. Institutional protocols and algorithms that pertain to the evaluation of patients at risk for COVID-19 are in a state of rapid change based on information released by regulatory bodies including the CDC and federal and state organizations. These policies and algorithms were followed during the patient's care in the ED.  Some ED evaluations and interventions may be delayed as a result of limited staffing during the pandemic.*   ____________________________________________  FINAL CLINICAL IMPRESSION(S) / ED DIAGNOSES  Final diagnoses:  Sepsis, due to unspecified organism, unspecified whether acute organ dysfunction present ([place])  Urinary tract infection associated with nephrostomy catheter, initial encounter ([place])     MEDICATIONS GIVEN DURING THIS VISIT:  Medications  vancomycin (VANCOCIN) IVPB 1000 mg/200 mL premix (has no administration in time range)  ceFEPIme (MAXIPIME) 2 g in sodium chloride 0.9 % 100 mL IVPB (2 g Intravenous Bolus 04/26/19 0145)     ED Discharge Orders    None       Note:  This document was prepared using Dragon voice recognition software and may include unintentional dictation errors.   Gregor Hams, MD 04/26/19 1025    Gregor Hams, MD 04/26/19 [phone]

## 2019-04-27 LAB — CREATININE, SERUM
Creatinine, Ser: 1.18 mg/dL (ref 0.61–1.24)
GFR calc Af Amer: >60 mL/min (ref >60)
GFR calc non Af Amer: 58 mL/min — ABNORMAL LOW (ref >60)

## 2019-04-27 MED ORDER — SODIUM CHLORIDE 0.9% FLUSH
3.0000 mL | Freq: Two times a day (BID) | INTRAVENOUS | Status: DC
  Administered 2019-04-27 (×2): 3 mL via INTRAVENOUS

## 2019-04-27 MED ORDER — SODIUM CHLORIDE 0.9 % IV SOLN
2.0000 g | Freq: Two times a day (BID) | INTRAVENOUS | Status: DC
  Administered 2019-04-27 – 2019-04-28 (×4): 2 g via INTRAVENOUS
  Filled 2019-04-27 (×6): qty 2

## 2019-04-27 MED ORDER — SODIUM CHLORIDE 0.9 % IV BOLUS
250.0000 mL | Freq: Once | INTRAVENOUS | Status: AC
  Administered 2019-04-27: 250 mL via INTRAVENOUS

## 2019-04-27 MED ORDER — SODIUM CHLORIDE 0.9% FLUSH: 3.0000 mL | INTRAVENOUS | Status: DC | PRN

## 2019-04-27 MED ORDER — SODIUM CHLORIDE 0.9 % IV SOLN
INTRAVENOUS | Status: DC
  Administered 2019-04-27 – 2019-04-29 (×5): via INTRAVENOUS

## 2019-04-27 MED ORDER — SODIUM CHLORIDE 0.9 % IV SOLN: Freq: Once | INTRAVENOUS | Status: DC

## 2019-04-27 NOTE — Progress Notes (Addendum)
[facility] at [place] NAME: Vincent Poole    MR#:  [id number]  DATE OF BIRTH:  04-04-1935  SUBJECTIVE:  patient very hard on hearing. Denies any complaints. Was admitted with back pain. Has chronic nephrectomy tubes,bilaterally  REVIEW OF SYSTEMS:   Review of Systems  Constitutional: Negative for chills, fever and weight loss.  HENT: Positive for hearing loss. Negative for ear discharge, ear pain and nosebleeds.   Eyes: Negative for blurred vision, pain and discharge.  Respiratory: Negative for sputum production, shortness of breath, wheezing and stridor.   Cardiovascular: Negative for chest pain, palpitations, orthopnea and PND.  Gastrointestinal: Negative for abdominal pain, diarrhea, nausea and vomiting.  Genitourinary: Negative for frequency and urgency.  Musculoskeletal: Positive for back pain. Negative for joint pain.  Neurological: Negative for sensory change, speech change, focal weakness and weakness.  Psychiatric/Behavioral: Negative for depression and hallucinations. The patient is not nervous/anxious.    Tolerating Diet:yes Tolerating PT: pending this is [facility] how [facility] you DRUG ALLERGIES:  No Known Allergies  VITALS:  Blood pressure (!) 108/59, pulse 86, temperature 99.2 F (37.3 C), temperature source Oral, resp. rate 19, weight 70.5 kg, SpO2 99 %.  PHYSICAL EXAMINATION:   Physical Exam  GENERAL:  84 y.o.-year-old patient lying in the bed with no acute distress.  EYES: Pupils equal, round, reactive to light and accommodation. No scleral icterus. Extraocular muscles intact.  HEENT: Head atraumatic, normocephalic. Oropharynx and nasopharynx clear.  NECK:  Supple, no jugular venous distention. No thyroid enlargement, no tenderness.  LUNGS: Normal breath sounds bilaterally, no wheezing, rales, rhonchi. No use of accessory muscles of respiration.  CARDIOVASCULAR: S1, S2 normal. No murmurs,  rubs, or gallops.  ABDOMEN: Soft, nontender, nondistended. Bowel sounds present. No organomegaly or mass. Bilateral nephrectomy tubes, chronic EXTREMITIES: No cyanosis, clubbing or edema b/l.    NEUROLOGIC: Cranial nerves II through XII are intact. No focal Motor or sensory deficits b/l.   PSYCHIATRIC:  patient is alert and oriented x 2. Hard on hearing SKIN: No obvious rash, lesion, or ulcer.   LABORATORY PANEL:  CBC Recent Labs  Lab 04/26/19 0443  WBC 23.6*  HGB 7.9*  HCT 25.2*  PLT 317    Chemistries  Recent Labs  Lab 04/26/19 0133 04/26/19 0443 04/27/19 0719  NA 132* 133*  --   K 4.0 3.3*  --   CL 98 101  --   CO2 24 25  --   GLUCOSE 136* 141*  --   BUN 19 19  --   CREATININE 1.22 1.17 1.18  CALCIUM 8.6* 8.2*  --   MG  --  1.6*  --   AST 13*  --   --   ALT 13  --   --   ALKPHOS 80  --   --   BILITOT 0.5  --   --    Cardiac Enzymes No results for input(s): TROPONINI in the last 168 hours. RADIOLOGY:  Ct Renal Stone Study  Result Date: 04/26/2019 CLINICAL DATA:  Fever. Back pain. Indwelling bilateral nephrostomy tubes. EXAM: CT ABDOMEN AND PELVIS WITHOUT CONTRAST TECHNIQUE: Multidetector CT imaging of the abdomen and pelvis was performed following the standard protocol without IV contrast. COMPARISON:  Noncontrast CT 04/08/2019. FINDINGS: Lower chest: Pleural effusion on prior exam have resolved. Decreased pericardial effusion, persistently measuring 11 mm adjacent to the right heart. Coronary artery calcifications. Hepatobiliary: No focal hepatic abnormality on noncontrast exam. Clips in the gallbladder  fossa postcholecystectomy. No biliary dilatation. Pancreas: Parenchymal atrophy. No ductal dilatation or inflammation. Spleen: Normal in size without focal abnormality. Adrenals/Urinary Tract: Similar left adrenal thickening. Normal right adrenal gland. Right nephrostomy tube in place, tip in the renal pelvis. No fluid or inflammation along the nephrostomy tract.  Decreased right hydronephrosis from prior exam. Persistent dilatation of the right renal collecting system and ureter to the level of the bladder. Resolved right perinephric stranding from prior. Simple cyst in the upper right kidney again seen. Left nephrostomy tube in place, tip in the renal pelvis. No abnormality along the nephrostomy tract. Minimal prominence of the left renal collecting system without frank hydronephrosis. Left ureter is prominent to the level of the bladder. Partially necrotic bladder tumor is not significantly changed from previous. Pelvic bowel loops again are intimately related to bladder mass. Stomach/Bowel: Small hiatal hernia. Stomach is decompressed. Duodenal diverticulum without inflammation. No small bowel obstruction. Pelvic small bowel loops are immediately related to necrotic bladder tumor. Normal appendix. Colonic tortuosity with moderate colonic stool burden. Stool distends the rectum. No colonic inflammation. Vascular/Lymphatic: Advanced aortic atherosclerosis. Small retroperitoneal lymph nodes all subcentimeter short axis. No definite pelvic adenopathy, lack of contrast limits detailed assessment. Reproductive: Stable prostate involvement by large bladder lesion. Other: Mild presacral edema, unchanged. No free fluid. No free air. Tiny fat containing umbilical hernia. Musculoskeletal: Diffuse bony under mineralization. Multilevel degenerative change throughout the spine. Advanced degenerative change of both hips. Intramedullary rod with trans trochanteric screw partially included. IMPRESSION: 1. Bilateral nephrostomy tubes in place. Mild right hydronephrosis which is improved from most recent CT. Slight prominence of the left renal collecting system which has slightly increased. 2. 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 3. Resolved right pleural effusion. Decreased pericardial effusion from prior. 4.  Aortic Atherosclerosis (ICD10-I70.0). Electronically  Signed   By: Keith Rake M.D.   On: 04/26/2019 02:33   ASSESSMENT AND PLAN:  Vincent Poole  is a 84 y.o. Caucasian male with a known history of multiple medical problems that are mentioned below, including bilateral nephrostomy tubes who presented to the emergency room with acute onset of back pain.  He was noted to be hypotensive and tachycardic by EMS with subjective fever.  His temperature was up to 101.8 with a heart rate of 120 and respiratory rate of 22-24 prior to arrival to the ER  1.  Sepsis secondary to recurrent UTI with chronic bilateral nephrectomy tubes and Chronic Foley catheter -  currently pt has no evidence for severe sepsis or septic shock.   -cont  IV cefepime - urology consultation appreciated  With Dr. Erlene Quan. It was seen by Zara Council. -BC 1/4 BCID--staph llikely contamination -no fever -Pt recently had bilateral nephrostomy tubes exchanged on June 12 th -He also has chornic foly (changed every 30 days per Urology)  2.  Hypokalemia.  Potassium will be replaced and magnesium level will be checked  3.  Hypertension.  Blood pressure is currently fairly controlled.  He will be placed on PRN IV labetalol.  4.  Depression.   continue his Myrbetriq.  5.  History of urinary bladder and prostate cancer.  He is followed by Dr. Ronette Deter -currently not on any active rx per Dr rao--f/u in 2 weeks -was on Tecntriq  6.  DVT prophylaxis.  Subcutaneous Lovenox  7.leucocytosis appears chronic due to malignancy  PT to see pt. At baseline patient is not much ambulatory. He lives with his wife at home. Uses a walker to get  around.   Discussed with patient's wife Ms. [name] over the phone and updated. CODE STATUS: Full  DVT Prophylaxis: elliqus  TOTAL TIME TAKING CARE OF THIS PATIENT: *30* minutes.  >50% time spent on counselling and coordination of care  POSSIBLE D/C IN *1-2* DAYS, DEPENDING ON CLINICAL CONDITION.  Note: This dictation was prepared with  Dragon dictation along with smaller phrase technology. Any transcriptional errors that result from this process are unintentional.  Fritzi Mandes M.D on 04/27/2019 at 1:19 PM  Between 7am to 6pm - Pager - [phone]  After 6pm go to [email or web address] - password [email or web address] [facility]  Office  [phone]  CC: Primary care physician; [name], MDPatient ID: [name], male   DOB: 1935-10-10, 84 y.o.   MRN: [id number]

## 2019-04-27 NOTE — Progress Notes (Signed)
Pharmacy Antibiotic Note  Vincent Poole is a 84 y.o. male admitted on 04/26/2019 with sepsis secondary to UTI. Patient with urothelial carcinoma and extensive necrosis involoving the bladder and prostate. Patient has bilateral nephrostomy tubes. Pharmacy has been consulted for cefepime and vancomycin dosing.  Plan: BCID gres methicillin resistant staph species in 1/4 cultures; aerobic bottle only. Previously reported as methicillin resistant staph aureus. MRSS likely a contaminant. Spoke with physician and recommended resuming cefepime pending all culture results. Urine culture has been re-incubated for better growth.   Resume cefepime 2g IV Q12hr.    Continue vancomycin 1g IV Q24hr. Will continue to monitor creatinine daily and obtain peak/trough as clinically indicated.   Weight: 155 lb 8 oz (70.5 kg)  Temp (24hrs), Avg:99.3 F (37.4 C), Min:98 F (36.7 C), Max:101.4 F (38.6 C)  Recent Labs  Lab 04/23/19 0932 04/26/19 0133 04/26/19 0320 04/26/19 0443 04/26/19 0526 04/27/19 0719  WBC 26.7* 25.9*  --  23.6*  --   --   CREATININE 1.23 1.22  --  1.17  --  1.18  LATICACIDVEN  --  2.0* 0.9  --  0.8  --     Estimated Creatinine Clearance: 49 mL/min (by C-G formula based on SCr of 1.18 mg/dL).    No Known Allergies  Antimicrobials this admission: Vancomycin 6/19 >>  Cefepime 6/19 >>  Dose adjustments this admission: N/A  Microbiology results: 6/19 BCx: MRSS; 1/4 Aerobic bottle only  6/19 UCx: culture re-incubated for better growth  6/19 COVID: negative   Thank you for allowing pharmacy to be a part of this patient's care.  Simpson,Michael L 04/27/2019 1:21 PM

## 2019-04-27 NOTE — Progress Notes (Signed)
Attempted to call both his wife and his daughter.  Left messages for both.

## 2019-04-28 LAB — CREATININE, SERUM
Creatinine, Ser: 1.37 mg/dL — ABNORMAL HIGH (ref 0.61–1.24)
GFR calc Af Amer: 56 mL/min — ABNORMAL LOW (ref >60)
GFR calc non Af Amer: 48 mL/min — ABNORMAL LOW (ref >60)

## 2019-04-28 LAB — CULTURE, BLOOD (ROUTINE X 2): Special Requests: ADEQUATE

## 2019-04-28 LAB — GLUCOSE, CAPILLARY: Glucose-Capillary: 98 mg/dL (ref 70–99)
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
## 2019-04-28 NOTE — Progress Notes (Signed)
Spoke to patient wife on the phone and put him on the phone to speak with her for updates. Patient has been very emotional and tearful today stating he "misses his family" emotional support and reassurance given to patient, seems to be doing better this evening.

## 2019-04-28 NOTE — Progress Notes (Signed)
Pharmacy Antibiotic Note  Vincent Poole is a 84 y.o. male admitted on 04/26/2019 with sepsis secondary to UTI. Patient with urothelial carcinoma and extensive necrosis involoving the bladder and prostate. Patient has bilateral nephrostomy tubes. Pharmacy has been consulted for cefepime and vancomycin dosing.  Plan: BCID gres methicillin resistant staph species in 1/4 cultures; aerobic bottle only. Previously reported as methicillin resistant staph aureus. MRSS likely a contaminant. Spoke with physician and recommended resuming cefepime pending all culture results. Urine culture has been re-incubated for better growth.   Resume cefepime 2g IV Q12hr.    Patient with pseudomonas in urine, vancomycin discontinued. Plan is to discharge on oral cipro.   Weight: 159 lb (72.1 kg)  Temp (24hrs), Avg:98.3 F (36.8 C), Min:97.7 F (36.5 C), Max:99.4 F (37.4 C)  Recent Labs  Lab 04/23/19 0932 04/26/19 0133 04/26/19 0320 04/26/19 0443 04/26/19 0526 04/27/19 0719 04/28/19 1209  WBC 26.7* 25.9*  --  23.6*  --   --   --   CREATININE 1.23 1.22  --  1.17  --  1.18 1.37*  LATICACIDVEN  --  2.0* 0.9  --  0.8  --   --     Estimated Creatinine Clearance: 43.1 mL/min (A) (by C-G formula based on SCr of 1.37 mg/dL (H)).    No Known Allergies  Antimicrobials this admission: Vancomycin 6/19 >> 6/21 Cefepime 6/19 >>  Dose adjustments this admission: N/A  Microbiology results: 6/19 BCx: MRSS; 1/4 Aerobic bottle only  6/19 UCx: > 100K Pseudomonas Aeruginosa  6/19 COVID: negative   Thank you for allowing pharmacy to be a part of this patient's care.  Joyceann Kruser L 04/28/2019 1:16 PM

## 2019-04-28 NOTE — Progress Notes (Signed)
[place] at [place] NAME: Vincent Poole    MR#:  [id number]  DATE OF BIRTH:  [birth year]  SUBJECTIVE:  patient very hard on hearing. Denies any complaints. He is a bit emotional today Was admitted with back pain. Has chronic nephrectomy tubes,bilaterally  REVIEW OF SYSTEMS:   Review of Systems  Constitutional: Negative for chills, fever and weight loss.  HENT: Positive for hearing loss. Negative for ear discharge, ear pain and nosebleeds.   Eyes: Negative for blurred vision, pain and discharge.  Respiratory: Negative for sputum production, shortness of breath, wheezing and stridor.   Cardiovascular: Negative for chest pain, palpitations, orthopnea and PND.  Gastrointestinal: Negative for abdominal pain, diarrhea, nausea and vomiting.  Genitourinary: Negative for frequency and urgency.  Musculoskeletal: Positive for back pain. Negative for joint pain.  Neurological: Negative for sensory change, speech change, focal weakness and weakness.  Psychiatric/Behavioral: Negative for depression and hallucinations. The patient is not nervous/anxious.    Tolerating Diet:yes Tolerating PT: pending  DRUG ALLERGIES:  No Known Allergies  VITALS:  Blood pressure 102/60, pulse 94, temperature 97.7 F (36.5 C), temperature source Oral, resp. rate 16, weight 72.1 kg, SpO2 97 %.  PHYSICAL EXAMINATION:   Physical Exam  GENERAL:  84 y.o.-year-old patient lying in the bed with no acute distress.  EYES: Pupils equal, round, reactive to light and accommodation. No scleral icterus. Extraocular muscles intact.  HEENT: Head atraumatic, normocephalic. Oropharynx and nasopharynx clear.  NECK:  Supple, no jugular venous distention. No thyroid enlargement, no tenderness.  LUNGS: Normal breath sounds bilaterally, no wheezing, rales, rhonchi. No use of accessory muscles of respiration.  CARDIOVASCULAR: S1, S2 normal. No murmurs, rubs, or gallops.  ABDOMEN:  Soft, nontender, nondistended. Bowel sounds present. No organomegaly or mass. Bilateral nephrectomy tubes, chronic, chronic foley EXTREMITIES: No cyanosis, clubbing or edema b/l.    NEUROLOGIC: Cranial nerves II through XII are intact. No focal Motor or sensory deficits b/l.   PSYCHIATRIC:  patient is alert and oriented x 2. Hard on hearing SKIN: No obvious rash, lesion, or ulcer.   LABORATORY PANEL:  CBC Recent Labs  Lab 04/26/19 0443  WBC 23.6*  HGB 7.9*  HCT 25.2*  PLT 317    Chemistries  Recent Labs  Lab 04/26/19 0133 04/26/19 0443 04/27/19 0719  NA 132* 133*  --   K 4.0 3.3*  --   CL 98 101  --   CO2 24 25  --   GLUCOSE 136* 141*  --   BUN 19 19  --   CREATININE 1.22 1.17 1.18  CALCIUM 8.6* 8.2*  --   MG  --  1.6*  --   AST 13*  --   --   ALT 13  --   --   ALKPHOS 80  --   --   BILITOT 0.5  --   --    Cardiac Enzymes No results for input(s): TROPONINI in the last 168 hours. RADIOLOGY:  No results found. ASSESSMENT AND PLAN:  Vincent Poole  is a 84 y.o. Caucasian male with a known history of multiple medical problems that are mentioned below, including bilateral nephrostomy tubes who presented to the emergency room with acute onset of back pain.  He was noted to be hypotensive and tachycardic by EMS with subjective fever.  His temperature was up to 101.8 with a heart rate of 120 and respiratory rate of 22-24 prior to arrival to the ER  1.  Sepsis  secondary to recurrent UTI with chronic bilateral nephrectomy tubes and Chronic Foley catheter -  currently pt has no evidence for severe sepsis or septic shock.   -cont  IV cefepime - urology consultation appreciated  With Dr. Erlene Quan. It was seen by Zara Council. -BC 1/4 BCID--staph coagulase UC Pseudomonas (pansensitive). Will change to po ciprofloxacin at discharge -no fever -Pt recently had bilateral nephrostomy tubes exchanged on June 12 th -He also has chornic foley (changed every 30 days per Urology)  2.   Hypokalemia.  Potassium will be replaced and magnesium level will be checked  3.  Hypertension.  Blood pressure is currently fairly controlled.  He will be placed on PRN IV labetalol.  4.  Depression.   continue his Myrbetriq.  5.  History of urinary bladder and prostate cancer.  He is followed by Dr. Ronette Deter -currently not on any active rx per Dr rao--f/u in 2 weeks -was on Tecntriq  6.  DVT prophylaxis.  Subcutaneous Lovenox  7.leucocytosis appears chronic due to malignancy  PT to see pt. At baseline patient is not much ambulatory. He lives with his wife at home. Uses a walker to get around.   Left message for patient's wife for update. If patient continues to improve will discharged home tomorrow. CODE STATUS: Full  DVT Prophylaxis: elliqus  TOTAL TIME TAKING CARE OF THIS PATIENT: *30* minutes.  >50% time spent on counselling and coordination of care  POSSIBLE D/C IN *1-2* DAYS, DEPENDING ON CLINICAL CONDITION.  Note: This dictation was prepared with Dragon dictation along with smaller phrase technology. Any transcriptional errors that result from this process are unintentional.  Fritzi Mandes M.D on 04/28/2019 at 12:36 PM  Between 7am to 6pm - Pager - [phone]  After 6pm go to [email or web address] - password [email or web address] [facility]  Office  [phone]  CC: Primary care physician; [name], MDPatient ID: Vincent Poole, male   DOB: 13-Sep-1935, 84 y.o.   MRN: [id number]

## 2019-04-28 NOTE — Evaluation (Signed)
Physical Therapy Evaluation Patient Details Name: Vincent Poole MRN: [id number] DOB: Mar 20, 1935 Today's Date: 04/28/2019   History of Present Illness  84 y.o. male with a known history of bladder cancer with bilateral indwelling stents and chronic indwelling foley who has had multiple recent hosptializations.  Relevant PMH includes DVT diagnosed 01/04/2019,  GERD, anxiety, arthrits, history of atrial flutter, R femur fracture, HTN, prostate cancer, umbilical hernia, wound eschar L of foot, urinary catheter, multi bladder surgeries/procedures, admitted for sepsis/UTI.    Clinical Impression  Patient alert, oriented to self, place, situation, pt intermittently emotional during session, expressed frustration about his current limitations and also missing home. Pt extremely HOH, most PLOF gathered from last admission about 2 weeks ago. Pt stated recently and when he came to the hospital he was unable to walk.  Pt demonstrated bed mobility modA, tactile cues for sequencing, assistance needed for weight shift, LE management and complete trunk elevation. Pt was able to sit EOB for several minutes, progressed from modA-supervision, even able to shift anteriorly with CGA. Sit<>stand with minAx2 and RW, second assist for line management and standing balance. Pt was able to take a few shuffling steps towards the recliner, and had excellent control with use of chair arms for stand to sit. Pt endorsed some fatigue/trembling, as well as anxiety during/after mobility.  Overall the patient demonstrated deficits (see "PT Problem List") that impede the patient's functional abilities, safety, and mobility and would benefit from skilled PT intervention. Recommendation is STR to maximize pt safety, independence, and activity tolerance/endurance.      Follow Up Recommendations SNF    Equipment Recommendations  None recommended by PT    Recommendations for Other Services       Precautions / Restrictions  Precautions Precautions: Fall Restrictions Weight Bearing Restrictions: No      Mobility  Bed Mobility Overal bed mobility: Needs Assistance Bed Mobility: Supine to Sit     Supine to sit: Min assist;HOB elevated        Transfers Overall transfer level: Needs assistance Equipment used: Rolling walker (2 wheeled) Transfers: Sit to/from Stand Sit to Stand: Min assist;+2 safety/equipment         General transfer comment: Pt able to sit EOB for several minutes prior to mobilization, initial complaints of dizziness that improved. 2+ used for safety lines/leads management  Ambulation/Gait Ambulation/Gait assistance: Min assist;+2 safety/equipment Gait Distance (Feet): 3 Feet Assistive device: Rolling walker (2 wheeled)       General Gait Details: Pt with shuffling gait, flexed trunk, fatigued quickly. excellent controlled stand to sit with use of chair arms  Stairs            Wheelchair Mobility    Modified Rankin (Stroke Patients Only)       Balance Overall balance assessment: Needs assistance Sitting-balance support: Feet supported;No upper extremity supported Sitting balance-Leahy Scale: Fair       Standing balance-Leahy Scale: Poor Standing balance comment: stooped, highly reliant on walker, fearful                             Pertinent Vitals/Pain Pain Assessment: No/denies pain    Home Living Family/patient expects to be discharged to:: Private residence Living Arrangements: Spouse/significant other;Children Available Help at Discharge: Family;Available 24 hours/day Type of Home: Mobile home Home Access: Stairs to enter Entrance Stairs-Rails: Left Entrance Stairs-Number of Steps: 4-5 Home Layout: One level Home Equipment: Walker - 2 wheels Additional Comments: PLOF information  gathered from admission 2 weeks ago, pt extremely hard of hearing.    Prior Function Level of Independence: Needs assistance   Gait / Transfers  Assistance Needed: transfers with assist from family to RW. Ambulates with assistance with RW.   ADL's / Homemaking Assistance Needed: patient requires help with dressing, bathing at times. Requires help with IADLs.   Comments: Pt.'s wife, son (home "24/7"), and daughter in law assists with meal preparation, medication management, and transportation. Patient sleeps in recliner.      Hand Dominance        Extremity/Trunk Assessment   Upper Extremity Assessment Upper Extremity Assessment: Generalized weakness    Lower Extremity Assessment Lower Extremity Assessment: Generalized weakness       Communication   Communication: HOH  Cognition Arousal/Alertness: Awake/alert Behavior During Therapy: WFL for tasks assessed/performed;Anxious Overall Cognitive Status: Within Functional Limits for tasks assessed                                        General Comments      Exercises Other Exercises Other Exercises: Pt progressed from [place] to supervision for sitting EOB balance with UE and LE supported   Assessment/Plan    PT Assessment Patient needs continued PT services  PT Problem List Decreased activity tolerance;Decreased strength;Decreased balance;Decreased mobility;Decreased cognition;Decreased knowledge of use of DME;Decreased safety awareness       PT Treatment Interventions DME instruction;Gait training;Stair training;Functional mobility training;Therapeutic activities;Balance training;Therapeutic exercise;Neuromuscular re-education;Patient/family education    PT Goals (Current goals can be found in the Care Plan section)  Acute Rehab PT Goals Patient Stated Goal: to go home PT Goal Formulation: With patient Time For Goal Achievement: 05/12/19    Frequency Min 2X/week   Barriers to discharge        Co-evaluation               AM-PAC PT "6 Clicks" Mobility  Outcome Measure Help needed turning from your back to your side while in a flat bed  without using bedrails?: A Little Help needed moving from lying on your back to sitting on the side of a flat bed without using bedrails?: A Lot Help needed moving to and from a bed to a chair (including a wheelchair)?: A Little Help needed standing up from a chair using your arms (e.g., wheelchair or bedside chair)?: A Little Help needed to walk in hospital room?: A Lot Help needed climbing 3-5 steps with a railing? : Total 6 Click Score: 14    End of Session Equipment Utilized During Treatment: Gait belt Activity Tolerance: Patient limited by fatigue Patient left: in chair;with call bell/phone within reach;with nursing/sitter in room;with chair alarm set Nurse Communication: Mobility status PT Visit Diagnosis: Muscle weakness (generalized) (M62.81);Difficulty in walking, not elsewhere classified (R26.2);Other abnormalities of gait and mobility (R26.89)    Time: 9191-6606 PT Time Calculation (min) (ACUTE ONLY): 27 min   Charges:   PT Evaluation $PT Eval Moderate Complexity: 1 Mod PT Treatments $Therapeutic Activity: 8-22 mins        Lieutenant Diego PT, DPT 12:35 PM,04/28/19 [phone]

## 2019-04-29 MED ORDER — METOPROLOL TARTRATE 25 MG PO TABS: 25.0000 mg | ORAL_TABLET | Freq: Two times a day (BID) | ORAL | 1 refills | Status: DC

## 2019-04-29 MED ORDER — CIPROFLOXACIN HCL 500 MG PO TABS: 500.0000 mg | ORAL_TABLET | Freq: Two times a day (BID) | ORAL | 0 refills | Status: AC

## 2019-04-29 MED ORDER — CIPROFLOXACIN HCL 500 MG PO TABS
500.0000 mg | ORAL_TABLET | Freq: Two times a day (BID) | ORAL | Status: DC
  Administered 2019-04-29: 500 mg via ORAL
  Filled 2019-04-29 (×2): qty 1

## 2019-04-29 NOTE — Progress Notes (Signed)
Patient discharged home today with wife and son. Wife and son educated on patient discharge needs, and follow up appointments. Patient still weak at baseline but able to transfer with 1 assist and walker, and ambulate in small increments. VSS, no concerns voiced by wife or son at this time.

## 2019-04-29 NOTE — Care Management Important Message (Signed)
Important Message  Patient Details  Name: Vincent Poole MRN: [id number] Date of Birth: [birth year]   Medicare Important Message Given:  Yes     Dannette Barbara 04/29/2019, 11:36 AM

## 2019-04-29 NOTE — Discharge Summary (Signed)
[place] [facility] [place] NAME: Vincent Poole    MR#:  [id number]  DATE OF BIRTH:  [birth year]  DATE OF ADMISSION:  04/26/2019 ADMITTING PHYSICIAN: Christel Mormon, MD  DATE OF DISCHARGE: 04/29/2019  PRIMARY CARE PHYSICIAN: [name], MD    ADMISSION DIAGNOSIS:  Urinary tract infection associated with nephrostomy catheter, initial encounter ([place]) [Q59.563O, N39.0] Sepsis, due to unspecified organism, unspecified whether acute organ dysfunction present ([place]) [A41.9]  DISCHARGE DIAGNOSIS:  *sepsis secondary to recurrent UTI with chronic bilateral nephrectomy tubes and chronic Foley catheter *high-grade Uro- epithelial carcinoma *UTI--pseudomonas  SECONDARY DIAGNOSIS:   Past Medical History:  Diagnosis Date  . Anxiety   . Arthritis   . Bladder cancer ([place])   . Dysrhythmia 07/2018   history of atrial flutter that worsens with anxiety  . Femur fracture, right ([place]) 05/01/2018  . GERD (gastroesophageal reflux disease)   . History of recent blood transfusion 05/2018  . Hypertension   . Iron deficiency anemia 09/14/2018  . Prostate cancer ([place]) 07/2018   cancer growing in prostate but not prostate cancer, it is from the bladder  . Umbilical hernia 75/6433  . Urinary retention 2019   foley catheter place 11/2017  . UTI (urinary tract infection) 2019   frequent UTI's over last year  . Wound eschar of foot 07/2018   left heal getting wrapped and requiring antibiotic cream. cracks open with weight bearing.    HOSPITAL COURSE:  [name] a84 y.o.Caucasian malewith a known history of multiple medical problems that are mentioned below, including bilateral nephrostomy tubes who presented to the emergency room with acute onset of back pain. He was noted to be hypotensive and tachycardic by EMS with subjective fever. His temperature was up to 101.8 with a heart rate of 120 and respiratory rate of 22-24 prior to arrival to the  ER  1. Sepsis secondary to recurrent UTI with chronic bilateral nephrostomy tubes and Chronic Foley catheter -sepsis resolved -cont  IV cefepime--changed to po cipro (Urine C/S) - urology consultation appreciated  With Dr. Erlene Quan. pt was seen by Zara Council. -BC 1/4 BCID--staph coagulase UC Pseudomonas (pansensitive) -no fever -Pt recently had bilateral nephrostomy tubes exchanged on June 12 th -He also has chornic foley (changed every 30 days per Urology)  2. Hypokalemia.received po K  3. Hypertension.Blood pressure is currently fairly controlled. He will be placed on PRN IV labetalol.  4. Depression.  continue his Myrbetriq.  5. History of urinary bladder and prostate cancer.He is followed by Dr. Ronette Deter -currently not on any active rx per Dr rao--f/u in 2 weeks -was on Tecntriq  6. DVT prophylaxis. Subcutaneous Lovenox  7.leucocytosis appears chronic due to malignancy  PT to see pt. At baseline patient is not much ambulatory. He lives with his wife at home. Uses a walker to get around.  They do not wants HH at home.  Overall stable. D/c home. Pt agreeable  CONSULTS OBTAINED:  Treatment Team:  Billey Co, MD  DRUG ALLERGIES:  No Known Allergies  DISCHARGE MEDICATIONS:   Allergies as of 04/29/2019   No Known Allergies     Medication List    STOP taking these medications   potassium chloride SA 20 MEQ tablet Commonly known as: K-DUR     TAKE these medications   acetaminophen 500 MG tablet Commonly known as: TYLENOL Take 1,000 mg by mouth every 6 (six) hours as needed for moderate pain or headache.   cetirizine 10  MG tablet Commonly known as: ZYRTEC Take 10 mg by mouth daily as needed for allergies.   ciprofloxacin 500 MG tablet Commonly known as: Cipro Take 1 tablet (500 mg total) by mouth 2 (two) times daily for 6 days. Last day 05/05/2019   Eliquis 5 MG Tabs tablet Generic drug: apixaban TAKE ONE TABLET BY  MOUTH TWICE DAILY What changed: how much to take   ferrous sulfate 325 (65 FE) MG tablet Take 1 tablet (325 mg total) by mouth 2 (two) times daily with a meal.   lidocaine-prilocaine cream Commonly known as: EMLA Apply 1 application topically as needed.   metoprolol tartrate 25 MG tablet Commonly known as: LOPRESSOR Take 1 tablet (25 mg total) by mouth 2 (two) times daily.   multivitamin with minerals Tabs tablet Take 1 tablet by mouth daily.   Myrbetriq 50 MG Tb24 tablet Generic drug: mirabegron ER TAKE ONE TABLET EVERY DAY What changed: how much to take   nystatin powder Commonly known as: Nyamyc Apply topically 2 (two) times daily.   oxyCODONE 5 MG immediate release tablet Commonly known as: Oxy IR/ROXICODONE Take 1 tablet (5 mg total) by mouth every 4 (four) hours as needed for severe pain.   polyethylene glycol powder 17 GM/SCOOP powder Commonly known as: MiraLax Take 17 g by mouth daily as needed. Can increase to 3 times a day as needed for constipation but hold medication if has diarrhea   Super Cranberry/Vitamin D3 4200-500 MG-UNIT Caps Generic drug: Cranberry-Cholecalciferol Take 1 capsule by mouth 2 (two) times daily.   vitamin B-12 500 MCG tablet Commonly known as: CYANOCOBALAMIN Take 500 mcg by mouth daily.   zinc oxide 20 % ointment Apply 1 application topically as needed for irritation.       If you experience worsening of your admission symptoms, develop shortness of breath, life threatening emergency, suicidal or homicidal thoughts you must seek medical attention immediately by calling 911 or calling your MD immediately  if symptoms less severe.  You Must read complete instructions/literature along with all the possible adverse reactions/side effects for all the Medicines you take and that have been prescribed to you. Take any new Medicines after you have completely understood and accept all the possible adverse reactions/side effects.   Please  note  You were cared for by a hospitalist during your hospital stay. If you have any questions about your discharge medications or the care you received while you were in the hospital after you are discharged, you can call the unit and asked to speak with the hospitalist on call if the hospitalist that took care of you is not available. Once you are discharged, your primary care physician will handle any further medical issues. Please note that NO REFILLS for any discharge medications will be authorized once you are discharged, as it is imperative that you return to your primary care physician (or establish a relationship with a primary care physician if you do not have one) for your aftercare needs so that they can reassess your need for medications and monitor your lab values. Today   SUBJECTIVE   Hard on hearing. Wants to go home  VITAL SIGNS:  Blood pressure 125/73, pulse 82, temperature 97.6 F (36.4 C), temperature source Oral, resp. rate 20, weight 72.1 kg, SpO2 100 %.  I/O:    Intake/Output Summary (Last 24 hours) at 04/29/2019 1231 Last data filed at 04/29/2019 1011 Gross per 24 hour  Intake 4377.4 ml  Output 3275 ml  Net 1102.4 ml  PHYSICAL EXAMINATION:  GENERAL:  84 y.o.-year-old patient lying in the bed with no acute distress.  EYES: Pupils equal, round, reactive to light and accommodation. No scleral icterus. Extraocular muscles intact.  HEENT: Head atraumatic, normocephalic. Oropharynx and nasopharynx clear.  NECK:  Supple, no jugular venous distention. No thyroid enlargement, no tenderness.  LUNGS: Normal breath sounds bilaterally, no wheezing, rales,rhonchi or crepitation. No use of accessory muscles of respiration.  CARDIOVASCULAR: S1, S2 normal. No murmurs, rubs, or gallops.  ABDOMEN: Soft, non-tender, non-distended. Bowel sounds present. No organomegaly or mass. Bilateral nephrectomy tubes, chronic, chronic foley EXTREMITIES: No pedal edema, cyanosis, or clubbing.   NEUROLOGIC: Cranial nerves II through XII are intact. Muscle strength 5/5 in all extremities. Sensation intact. Gait not checked.  PSYCHIATRIC: The patient is alert and oriented x 3.  SKIN: No obvious rash, lesion, or ulcer.   DATA REVIEW:   CBC  Recent Labs  Lab 04/26/19 0443  WBC 23.6*  HGB 7.9*  HCT 25.2*  PLT 317    Chemistries  Recent Labs  Lab 04/26/19 0133 04/26/19 0443  04/28/19 1209  NA 132* 133*  --   --   K 4.0 3.3*  --   --   CL 98 101  --   --   CO2 24 25  --   --   GLUCOSE 136* 141*  --   --   BUN 19 19  --   --   CREATININE 1.22 1.17   < > 1.37*  CALCIUM 8.6* 8.2*  --   --   MG  --  1.6*  --   --   AST 13*  --   --   --   ALT 13  --   --   --   ALKPHOS 80  --   --   --   BILITOT 0.5  --   --   --    < > = values in this interval not displayed.    Microbiology Results   Recent Results (from the past 240 hour(s))  Blood Culture (routine x 2)     Status: None (Preliminary result)   Collection Time: 04/26/19  1:33 AM   Specimen: BLOOD  Result Value Ref Range Status   Specimen Description BLOOD BLOOD RIGHT FOREARM  Final   Special Requests   Final    BOTTLES DRAWN AEROBIC AND ANAEROBIC Blood Culture results may not be optimal due to an excessive volume of blood received in culture bottles   Culture  Setup Time PENDING  Incomplete   Culture   Final    NO GROWTH 3 DAYS Performed at [facility], [address] [place], [place] [address]    Report Status PENDING  Incomplete  Blood Culture (routine x 2)     Status: Abnormal   Collection Time: 04/26/19  1:33 AM   Specimen: BLOOD  Result Value Ref Range Status   Specimen Description   Final    BLOOD BLOOD LEFT FOREARM Performed at [facility], [address], [place], [place] [address]    Special Requests   Final    BOTTLES DRAWN AEROBIC AND ANAEROBIC Blood Culture adequate volume Performed at [facility], [address], [place], [place] [address]    Culture   Setup Time   Final    GRAM POSITIVE COCCI AEROBIC BOTTLE ONLY CRITICAL RESULT CALLED TO, READ BACK BY AND VERIFIED WITH: DAVID BESANTI @2200  04/26/19 MJU    Culture (A)  Final  STAPHYLOCOCCUS SPECIES (COAGULASE NEGATIVE) THE SIGNIFICANCE OF ISOLATING THIS ORGANISM FROM A SINGLE SET OF BLOOD CULTURES WHEN MULTIPLE SETS ARE DRAWN IS UNCERTAIN. PLEASE NOTIFY THE MICROBIOLOGY DEPARTMENT WITHIN ONE WEEK IF SPECIATION AND SENSITIVITIES ARE REQUIRED. Performed at [facility], [place] [address], [place], [place] [address]    Report Status 04/28/2019 FINAL  Final  Urine culture     Status: Abnormal (Preliminary result)   Collection Time: 04/26/19  1:33 AM   Specimen: Urine, Random  Result Value Ref Range Status   Specimen Description URINE, RANDOM  Final   Special Requests NONE  Final   Culture (A)  Final    >=100,000 COLONIES/mL PSEUDOMONAS AERUGINOSA CULTURE REINCUBATED FOR BETTER GROWTH    Report Status PENDING  Incomplete   Organism ID, Bacteria PSEUDOMONAS AERUGINOSA (A)  Final      Susceptibility   Pseudomonas aeruginosa - MIC*    CEFTAZIDIME 4 SENSITIVE Sensitive     CIPROFLOXACIN <=0.25 SENSITIVE Sensitive     GENTAMICIN <=1 SENSITIVE Sensitive     IMIPENEM 2 SENSITIVE Sensitive     PIP/TAZO 32 SENSITIVE Sensitive     CEFEPIME Value in next row Sensitive      4 SENSITIVEPerformed at [facility] [address], [place], [place] [address]    * >=100,000 COLONIES/mL PSEUDOMONAS AERUGINOSA  SARS Coronavirus 2 (CEPHEID - Performed in [place] hospital lab), Hosp Order     Status: None   Collection Time: 04/26/19  1:33 AM   Specimen: Nasopharyngeal Swab  Result Value Ref Range Status   SARS Coronavirus 2 NEGATIVE NEGATIVE Final    Comment: (NOTE) If result is NEGATIVE SARS-CoV-2 target nucleic acids are NOT DETECTED. The SARS-CoV-2 RNA is generally detectable in upper and lower  respiratory specimens during the acute phase of infection. The lowest  concentration of  SARS-CoV-2 viral copies this assay can detect is 250  copies / mL. A negative result does not preclude SARS-CoV-2 infection  and should not be used as the sole basis for treatment or other  patient management decisions.  A negative result may occur with  improper specimen collection / handling, submission of specimen other  than nasopharyngeal swab, presence of viral mutation(s) within the  areas targeted by this assay, and inadequate number of viral copies  (<250 copies / mL). A negative result must be combined with clinical  observations, patient history, and epidemiological information. If result is POSITIVE SARS-CoV-2 target nucleic acids are DETECTED. The SARS-CoV-2 RNA is generally detectable in upper and lower  respiratory specimens dur ing the acute phase of infection.  Positive  results are indicative of active infection with SARS-CoV-2.  Clinical  correlation with patient history and other diagnostic information is  necessary to determine patient infection status.  Positive results do  not rule out bacterial infection or co-infection with other viruses. If result is PRESUMPTIVE POSTIVE SARS-CoV-2 nucleic acids MAY BE PRESENT.   A presumptive positive result was obtained on the submitted specimen  and confirmed on repeat testing.  While 2019 novel coronavirus  (SARS-CoV-2) nucleic acids may be present in the submitted sample  additional confirmatory testing may be necessary for epidemiological  and / or clinical management purposes  to differentiate between  SARS-CoV-2 and other Sarbecovirus currently known to infect humans.  If clinically indicated additional testing with an alternate test  methodology [phone]) is advised. The SARS-CoV-2 RNA is generally  detectable in upper and lower respiratory sp ecimens during the acute  phase of  infection. The expected result is Negative. Fact Sheet for Patients:  StrictlyIdeas.no Fact Sheet for Healthcare  Providers: BankingDealers.co.za This test is not yet approved or cleared by the [place] FDA and has been authorized for detection and/or diagnosis of SARS-CoV-2 by FDA under an Emergency Use Authorization (EUA).  This EUA will remain in effect (meaning this test can be used) for the duration of the COVID-19 declaration under Section 564(b)(1) of the Act, 21 U.S.C. section 360bbb-3(b)(1), unless the authorization is terminated or revoked sooner. Performed at [facility], [address] [place], [place] [address]   Blood Culture ID Panel (Reflexed)     Status: Abnormal   Collection Time: 04/26/19  1:33 AM  Result Value Ref Range Status   Enterococcus species NOT DETECTED NOT DETECTED Final   Listeria monocytogenes NOT DETECTED NOT DETECTED Final   Staphylococcus species DETECTED (A) NOT DETECTED Final    Comment: Methicillin (oxacillin) resistant coagulase negative staphylococcus. Possible blood culture contaminant (unless isolated from more than one blood culture draw or clinical case suggests pathogenicity). No antibiotic treatment is indicated for blood  culture contaminants. CRITICAL RESULT CALLED TO, READ BACK BY AND VERIFIED WITH: DAVID BESANTI @2200  04/26/19 MJU     Staphylococcus aureus (BCID) NOT DETECTED NOT DETECTED Final   Methicillin resistance DETECTED (A) NOT DETECTED Final    Comment: CRITICAL RESULT CALLED TO, READ BACK BY AND VERIFIED WITH: DAVID BESANTI @2200  04/26/19 MJU    Streptococcus species NOT DETECTED NOT DETECTED Final   Streptococcus agalactiae NOT DETECTED NOT DETECTED Final   Streptococcus pneumoniae NOT DETECTED NOT DETECTED Final   Streptococcus pyogenes NOT DETECTED NOT DETECTED Final   Acinetobacter baumannii NOT DETECTED NOT DETECTED Final   Enterobacteriaceae species NOT DETECTED NOT DETECTED Final   Enterobacter cloacae complex NOT DETECTED NOT DETECTED Final   Escherichia coli NOT DETECTED NOT DETECTED Final    Klebsiella oxytoca NOT DETECTED NOT DETECTED Final   Klebsiella pneumoniae NOT DETECTED NOT DETECTED Final   Proteus species NOT DETECTED NOT DETECTED Final   Serratia marcescens NOT DETECTED NOT DETECTED Final   Haemophilus influenzae NOT DETECTED NOT DETECTED Final   Neisseria meningitidis NOT DETECTED NOT DETECTED Final   Pseudomonas aeruginosa NOT DETECTED NOT DETECTED Final   Candida albicans NOT DETECTED NOT DETECTED Final   Candida glabrata NOT DETECTED NOT DETECTED Final   Candida krusei NOT DETECTED NOT DETECTED Final   Candida parapsilosis NOT DETECTED NOT DETECTED Final   Candida tropicalis NOT DETECTED NOT DETECTED Final    Comment: Performed at [facility], [address] [place], [place] [address]    RADIOLOGY:  No results found.   CODE STATUS:     Code Status Orders  (From admission, onward)         Start     Ordered   04/26/19 0421  Full code  Continuous     04/26/19 0420        Code Status History    Date Active Date Inactive Code Status Order ID Comments User Context   04/08/2019 1211 04/10/2019 1628 Full Code [id number]  Gladstone Lighter, MD ED   03/22/2019 2337 03/26/2019 1934 Full Code [id number]  Lance Coon, MD Inpatient   02/21/2019 1505 02/26/2019 1849 Full Code [id number]  Sela Hua, MD Inpatient   11/12/2018 1849 11/16/2018 1632 Full Code [id number]  Epifanio Lesches, MD Inpatient   05/01/2018 1702 05/06/2018 1711 Full Code [id number]  Dustin Flock, MD Inpatient   12/26/2017 2141 12/30/2017 1727 Full  Code [id number]  Demetrios Loll, MD Inpatient   05/31/2017 0208 06/01/2017 1922 Full Code [id number]  Harvie Bridge, DO Inpatient   03/04/2017 1833 03/05/2017 1426 Full Code [id number]  Idelle Crouch, MD Inpatient   07/11/2016 0538 07/13/2016 1420 Full Code [id number]  Saundra Shelling, MD ED   Advance Care Planning Activity      TOTAL TIME TAKING CARE OF THIS PATIENT: *40* minutes.    Fritzi Mandes M.D on 04/29/2019 at 12:31 PM  Between 7am  to 6pm - Pager - [phone] After 6pm go to [email or web address] - password [email or web address]  Office  [phone]  CC: Primary care physician; [name], MD

## 2019-04-29 NOTE — Plan of Care (Signed)
  Problem: Education: Goal: Knowledge of General Education information will improve Description: Including pain rating scale, medication(s)/side effects and non-pharmacologic comfort measures Outcome: Progressing   Problem: Clinical Measurements: Goal: Ability to maintain clinical measurements within normal limits will improve Outcome: Progressing Goal: Diagnostic test results will improve Outcome: Progressing Goal: Cardiovascular complication will be avoided Outcome: Progressing   

## 2019-04-29 NOTE — Discharge Instructions (Signed)
Nephrostomy tube care as before

## 2019-04-30 ENCOUNTER — Other Ambulatory Visit: Payer: Self-pay | Admitting: Urology

## 2019-04-30 LAB — CULTURE, BLOOD (ROUTINE X 2)

## 2019-04-30 LAB — URINE CULTURE: Culture: >=100000 — AB

## 2019-05-01 LAB — CULTURE, BLOOD (ROUTINE X 2): Culture: NO GROWTH

## 2019-05-06 ENCOUNTER — Ambulatory Visit: Payer: Medicare Other | Admitting: Oncology

## 2019-05-06 ENCOUNTER — Ambulatory Visit: Payer: Medicare Other

## 2019-05-06 ENCOUNTER — Other Ambulatory Visit: Payer: Medicare Other

## 2019-05-07 ENCOUNTER — Encounter: Payer: Self-pay | Admitting: Oncology

## 2019-05-07 ENCOUNTER — Other Ambulatory Visit: Payer: Self-pay

## 2019-05-07 ENCOUNTER — Inpatient Hospital Stay: Payer: Medicare Other

## 2019-05-07 ENCOUNTER — Inpatient Hospital Stay (HOSPITAL_BASED_OUTPATIENT_CLINIC_OR_DEPARTMENT_OTHER): Payer: Medicare Other | Admitting: Oncology

## 2019-05-07 VITALS — BP 116/64 | HR 83 | Temp 96.4°F | Wt 161.0 lb

## 2019-05-07 DIAGNOSIS — F419 Anxiety disorder, unspecified: Secondary | ICD-10-CM

## 2019-05-07 DIAGNOSIS — D649 Anemia, unspecified: Secondary | ICD-10-CM

## 2019-05-07 DIAGNOSIS — Z8546 Personal history of malignant neoplasm of prostate: Secondary | ICD-10-CM

## 2019-05-07 DIAGNOSIS — Z79899 Other long term (current) drug therapy: Secondary | ICD-10-CM

## 2019-05-07 DIAGNOSIS — C678 Malignant neoplasm of overlapping sites of bladder: Secondary | ICD-10-CM

## 2019-05-07 DIAGNOSIS — I10 Essential (primary) hypertension: Secondary | ICD-10-CM | POA: Diagnosis not present

## 2019-05-07 DIAGNOSIS — E860 Dehydration: Secondary | ICD-10-CM | POA: Diagnosis not present

## 2019-05-07 DIAGNOSIS — Z7901 Long term (current) use of anticoagulants: Secondary | ICD-10-CM

## 2019-05-07 DIAGNOSIS — Z5112 Encounter for antineoplastic immunotherapy: Secondary | ICD-10-CM | POA: Diagnosis present

## 2019-05-07 DIAGNOSIS — C679 Malignant neoplasm of bladder, unspecified: Secondary | ICD-10-CM

## 2019-05-07 DIAGNOSIS — Z9049 Acquired absence of other specified parts of digestive tract: Secondary | ICD-10-CM

## 2019-05-07 LAB — COMPREHENSIVE METABOLIC PANEL
ALT: 11 U/L (ref 0–44)
AST: 11 U/L — ABNORMAL LOW (ref 15–41)
Albumin: 2.6 g/dL — ABNORMAL LOW (ref 3.5–5.0)
Alkaline Phosphatase: 62 U/L (ref 38–126)
Anion gap: 9 (ref 5–15)
BUN: 23 mg/dL (ref 8–23)
CO2: 23 mmol/L (ref 22–32)
Calcium: 8.4 mg/dL — ABNORMAL LOW (ref 8.9–10.3)
Chloride: 105 mmol/L (ref 98–111)
Creatinine, Ser: 1.14 mg/dL (ref 0.61–1.24)
GFR calc Af Amer: >60 mL/min (ref >60)
GFR calc non Af Amer: 60 mL/min — ABNORMAL LOW (ref >60)
Glucose, Bld: 108 mg/dL — ABNORMAL HIGH (ref 70–99)
Potassium: 3.7 mmol/L (ref 3.5–5.1)
Sodium: 137 mmol/L (ref 135–145)
Total Bilirubin: 0.3 mg/dL (ref 0.3–1.2)
Total Protein: 6.7 g/dL (ref 6.5–8.1)

## 2019-05-07 LAB — CBC WITH DIFFERENTIAL/PLATELET
Abs Immature Granulocytes: 0.25 10*3/uL — ABNORMAL HIGH (ref 0.00–0.07)
Basophils Absolute: 0.2 10*3/uL — ABNORMAL HIGH (ref 0.0–0.1)
Basophils Relative: 1 %
Eosinophils Absolute: 0.4 10*3/uL (ref 0.0–0.5)
Eosinophils Relative: 2 %
HCT: 27.2 % — ABNORMAL LOW (ref 39.0–52.0)
Hemoglobin: 8.6 g/dL — ABNORMAL LOW (ref 13.0–17.0)
Immature Granulocytes: 1 %
Lymphocytes Relative: 9 %
Lymphs Abs: 2.4 10*3/uL (ref 0.7–4.0)
MCH: 29.7 pg (ref 26.0–34.0)
MCHC: 31.6 g/dL (ref 30.0–36.0)
MCV: 93.8 fL (ref 80.0–100.0)
Monocytes Absolute: 1.1 10*3/uL — ABNORMAL HIGH (ref 0.1–1.0)
Monocytes Relative: 5 %
Neutro Abs: 21 10*3/uL — ABNORMAL HIGH (ref 1.7–7.7)
Neutrophils Relative %: 82 %
Platelets: 404 10*3/uL — ABNORMAL HIGH (ref 150–400)
RBC: 2.9 MIL/uL — ABNORMAL LOW (ref 4.22–5.81)
RDW: 15.7 % — ABNORMAL HIGH (ref 11.5–15.5)
WBC: 25.3 10*3/uL — ABNORMAL HIGH (ref 4.0–10.5)
nRBC: 0 % (ref 0.0–0.2)

## 2019-05-07 MED ORDER — SODIUM CHLORIDE 0.9 % IV SOLN
Freq: Once | INTRAVENOUS | Status: AC
  Administered 2019-05-07: 11:00:00 via INTRAVENOUS
  Filled 2019-05-07: qty 250

## 2019-05-07 MED ORDER — SODIUM CHLORIDE 0.9% FLUSH
10.0000 mL | Freq: Once | INTRAVENOUS | Status: AC
  Administered 2019-05-07: 10 mL via INTRAVENOUS
  Filled 2019-05-07: qty 10

## 2019-05-07 MED ORDER — HEPARIN SOD (PORK) LOCK FLUSH 100 UNIT/ML IV SOLN
500.0000 [IU] | Freq: Once | INTRAVENOUS | Status: AC
  Administered 2019-05-07: 500 [IU] via INTRAVENOUS
  Filled 2019-05-07: qty 5

## 2019-05-07 MED ORDER — SODIUM CHLORIDE 0.9 % IV SOLN
1200.0000 mg | Freq: Once | INTRAVENOUS | Status: AC
  Administered 2019-05-07: 1200 mg via INTRAVENOUS
  Filled 2019-05-07: qty 20

## 2019-05-07 NOTE — Progress Notes (Signed)
Patient here today for follow up and chemotherapy treatment.  Patient c/o pain at nephrectomy tube on the right side

## 2019-05-07 NOTE — Progress Notes (Signed)
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 For his prostate cancer he has received IM RT in the past. He has been seeing Dr. Erlene Quan in the past for his recurrent UTIs as well as hematuria. CT scan in July 2018 showed bladder wall thickening with perivascular edema and inflammation in the right ureter greater than the left. Findings were thought to be due to pyelonephritis at that time. He underwent cystoscopy on 12/14/2017 which showed abnormal looking prostate with necrotic material lining the entire surface area. Diffuse copious debris within the bladder appearing to be erythematous without discrete bladder tumor but visualization was poor. He was then admitted to the hospital on 12/26/2017 with symptoms of UTI and sepsis as well as new moderate bilateral hydronephrosis.  CT abdomen and pelvis with contrast on 12/20/2017 again showed market bile bladder wall thickening with perivesicular edema. Within the lumen of the bladder there is a 93.9 cm soft tissue attenuating filling defect. This is indeterminate and could represent an area of blood clot versus urothelial lesion.  He underwent repeat cystoscopy with bilateral pyelogram and ureteral stent placement as well as TURBT and TURP. He was found to have a massive tumor involving the majority of the bladder with grossly necrotic and calcified material. There appeared  to be multifocal disease with large burden of the left anterior bladder wall extending posteriorly as well as adjacent to the bladder neck and beyond the right hemitrigone. Very little normal recognizable bladder mucosa remaining.   Biopsy from TURBT and TURP showed: High-grade urothelial carcinoma with extensive necrosisinvolving both the bladder and the prostate.CT chest did not reveal any evidence of metastatic disease. He was also not found to have any regional adenopathy on CT abdomen  Patient was seen by Dr. Erlene Quan and was not deemed to be a surgical candidate. He has been referred to [place] for definitive treatment options.  Patient lives with his wife at home and ambulates with a cane. He does need assistance with his ADLs to some extent. He has not had any falls and he denies any changes in his appetite or unintentional weight loss. Denies any pain. Reports some fatigue and occasional problems with constipation. He has had 3 hospitalizations last year for urinary tract infections and currently has a chronic Foley for the last 1 month  Dr. Erlene Quan performed interval TURBT on 01/15/18 and was able to debulk tumor as much as possible to reduce tumor burden  Patient received 5 cycles ofcarboplatin/ gemcitabine 2 weeks on and 1 week off ending 04/26/18.Patient did receive radiation for 10 days during cycle 2 of treatment. Given patients age, co-morbidities and frailty- he was not a cisplatin candidate.6th cycle not given due to fall and hip fracture  Patient had a repeat cystoscopy in September 2019 which showed no obvious tumor bladder but it did have a shaggy necrotic appearance at the bladder neck. Prostatic fossa was grossly abnormal necrotic and irregular without papillary change. Bladder neck was biopsied and showed residual high-grade urothelial carcinoma. Muscularis  propria was not seen in that specimen.Due to evidence of recurrent/residual disease patient was started  on Tecentriq on 07/26/2018   Interval history-patient and his wife report that overall he is doing better.  His appetite is improved and he is not so confused anymore.  He has been ambulating a little bit around the house with the help of a walker.  ECOG PS- 2 Pain scale- 0 Opioid associated constipation- no  Review of systems- Review of Systems  Constitutional: Positive for malaise/fatigue. Negative for chills, fever and weight loss.  HENT: Negative for congestion, ear discharge and nosebleeds.   Eyes: Negative for blurred vision.  Respiratory: Negative for cough, hemoptysis, sputum production, shortness of breath and wheezing.   Cardiovascular: Negative for chest pain, palpitations, orthopnea and claudication.  Gastrointestinal: Negative for abdominal pain, blood in stool, constipation, diarrhea, heartburn, melena, nausea and vomiting.  Genitourinary: Negative for dysuria, flank pain, frequency, hematuria and urgency.  Musculoskeletal: Negative for back pain, joint pain and myalgias.  Skin: Negative for rash.  Neurological: Negative for dizziness, tingling, focal weakness, seizures, weakness and headaches.  Endo/Heme/Allergies: Does not bruise/bleed easily.  Psychiatric/Behavioral: Negative for depression and suicidal ideas. The patient does not have insomnia.        No Known Allergies   Past Medical History:  Diagnosis Date   Anxiety    Arthritis    Bladder cancer ([place])    Dysrhythmia 07/2018   history of atrial flutter that worsens with anxiety   Femur fracture, right ([place]) 05/01/2018   GERD (gastroesophageal reflux disease)    History of recent blood transfusion 05/2018   Hypertension    Iron deficiency anemia 09/14/2018   Prostate cancer ([place]) 07/2018   cancer growing in prostate but not prostate cancer, it is from the bladder   Umbilical hernia 78/2423   Urinary retention 2019   foley catheter place 11/2017   UTI (urinary tract infection) 2019    frequent UTI's over last year   Wound eschar of foot 07/2018   left heal getting wrapped and requiring antibiotic cream. cracks open with weight bearing.     Past Surgical History:  Procedure Laterality Date   CARPAL TUNNEL RELEASE Right    CHOLECYSTECTOMY  2004   CYSTOGRAM  07/18/2018   Procedure: CYSTOGRAM;  Surgeon: [name], MD;  Location: [facility];  Service: Urology;;   CYSTOSCOPY W/ RETROGRADES Bilateral 07/18/2018   Procedure: CYSTOSCOPY WITH RETROGRADE PYELOGRAM;  Surgeon: [name], MD;  Location: [facility];  Service: Urology;  Laterality: Bilateral;   CYSTOSCOPY W/ URETERAL STENT PLACEMENT Bilateral 12/27/2017   Procedure: CYSTOSCOPY WITH RETROGRADE PYELOGRAM/URETERAL STENT PLACEMENT;  Surgeon: [name], MD;  Location: [facility];  Service: Urology;  Laterality: Bilateral;   CYSTOSCOPY W/ URETERAL STENT PLACEMENT Bilateral 07/18/2018   Procedure: CYSTOSCOPY WITH STENT REPLACEMENT (exchange);  Surgeon: [name], MD;  Location: [facility];  Service: Urology;  Laterality: Bilateral;   CYSTOSCOPY WITH STENT PLACEMENT Bilateral 11/12/2018   Procedure: [place] WITH STENT Exchange;  Surgeon: [name], MD;  Location: [facility];  Service: Urology;  Laterality: Bilateral;   INTRAMEDULLARY (IM) NAIL INTERTROCHANTERIC Right 05/02/2018   Procedure: INTRAMEDULLARY (IM) NAIL INTERTROCHANTRIC;  Surgeon: [name], MD;  Location: [facility];  Service: Orthopedics;  Laterality: Right;   IR NEPHROSTOMY EXCHANGE LEFT  03/25/2019   IR NEPHROSTOMY EXCHANGE LEFT  04/19/2019   IR NEPHROSTOMY EXCHANGE RIGHT  03/25/2019   IR NEPHROSTOMY EXCHANGE RIGHT  04/19/2019   IR NEPHROSTOMY PLACEMENT LEFT  02/23/2019  IR NEPHROSTOMY PLACEMENT RIGHT  02/23/2019   LEG TENDON SURGERY Right 1958   PORTA CATH INSERTION N/A 01/22/2018   Procedure: PORTA CATH INSERTION;  Surgeon: [name], MD;  Location: [facility];  Service: Cardiovascular;  Laterality: N/A;    TRANSURETHRAL RESECTION OF BLADDER TUMOR N/A 12/27/2017   Procedure: TRANSURETHRAL RESECTION OF BLADDER TUMOR (TURBT);  Surgeon: [name], MD;  Location: [facility];  Service: Urology;  Laterality: N/A;   TRANSURETHRAL RESECTION OF BLADDER TUMOR N/A 01/15/2018   Procedure: TRANSURETHRAL RESECTION OF BLADDER TUMOR (TURBT);  Surgeon: [name], MD;  Location: [facility];  Service: Urology;  Laterality: N/A;  Need 2 hrs for this case please   TRANSURETHRAL RESECTION OF BLADDER TUMOR N/A 07/18/2018   Procedure: TRANSURETHRAL RESECTION OF BLADDER TUMOR (TURBT);  Surgeon: [name], MD;  Location: [facility];  Service: Urology;  Laterality: N/A;    Social History   Socioeconomic History   Marital status: Married    Spouse name: ruth   Number of children: Not on file   Years of education: Not on file   Highest education level: Not on file  Occupational History   Occupation: retired    Comment: Therapist, nutritional [place] resource strain: Not on file   Food insecurity    Worry: Not on file    Inability: Not on file   Transportation needs    Medical: Not on file    Non-medical: Not on file  Tobacco Use   Smoking status: Never Smoker   Smokeless tobacco: Never Used  Substance and Sexual Activity   Alcohol use: No    Alcohol/week: 0.0 standard drinks   Drug use: No   Sexual activity: Not Currently  Lifestyle   Physical activity    Days per week: Not on file    Minutes per session: Not on file   Stress: Not on file  Relationships   Social connections    Talks on phone: Not on file    Gets together: Not on file    Attends religious service: Not on file    Active member of club or organization: Not on file    Attends meetings of clubs or organizations: Not on file    Relationship status: Not on file   Intimate partner violence    Fear of current or ex partner: Not on file    Emotionally abused: Not on file    Physically abused: Not on  file    Forced sexual activity: Not on file  Other Topics Concern   Not on file  Social History Narrative   Not on file    Family History  Problem Relation Age of Onset   Cancer Mother    Chronic Renal Failure Mother    Heart disease Father      Current Outpatient Medications:    acetaminophen (TYLENOL) 500 MG tablet, Take 1,000 mg by mouth every 6 (six) hours as needed for moderate pain or headache. , Disp: , Rfl:    cetirizine (ZYRTEC) 10 MG tablet, Take 10 mg by mouth daily as needed for allergies. , Disp: , Rfl:    ciprofloxacin (CIPRO) 500 MG tablet, Take by mouth., Disp: , Rfl:    Cranberry-Cholecalciferol (SUPER CRANBERRY/VITAMIN D3) 4200-500 MG-UNIT CAPS, Take 1 capsule by mouth 2 (two) times daily., Disp: , Rfl:    ELIQUIS 5 MG TABS tablet, TAKE ONE TABLET BY MOUTH TWICE DAILY (Patient taking differently: Take 5 mg by mouth  2 (two) times daily. ), Disp: 60 tablet, Rfl: 3   ferrous sulfate 325 (65 FE) MG tablet, Take 1 tablet (325 mg total) by mouth 2 (two) times daily with a meal., Disp: , Rfl: 3   lidocaine-prilocaine (EMLA) cream, Apply 1 application topically as needed., Disp: 30 g, Rfl: 1   metoprolol tartrate (LOPRESSOR) 25 MG tablet, Take 1 tablet (25 mg total) by mouth 2 (two) times daily., Disp: 30 tablet, Rfl: 1   Multiple Vitamin (MULTIVITAMIN WITH MINERALS) TABS tablet, Take 1 tablet by mouth daily., Disp: , Rfl:    MYRBETRIQ 50 MG TB24 tablet, TAKE 1 TABLET BY MOUTH DAILY, Disp: 30 tablet, Rfl: 3   nystatin (NYAMYC) powder, Apply topically 2 (two) times daily., Disp: 60 g, Rfl: 3   oxyCODONE (OXY IR/ROXICODONE) 5 MG immediate release tablet, Take 1 tablet (5 mg total) by mouth every 4 (four) hours as needed for severe pain., Disp: 60 tablet, Rfl: 0   polyethylene glycol powder (MIRALAX) powder, Take 17 g by mouth daily as needed. Can increase to 3 times a day as needed for constipation but hold medication if has diarrhea, Disp: 255 g, Rfl: 0    vitamin B-12 (CYANOCOBALAMIN) 500 MCG tablet, Take 500 mcg by mouth daily., Disp: , Rfl:    zinc oxide 20 % ointment, Apply 1 application topically as needed for irritation., Disp: , Rfl:   Physical exam:  Vitals:   05/07/19 0959  BP: 116/64  Pulse: 83  Temp: (!) 96.4 F (35.8 C)  TempSrc: Tympanic  Weight: 161 lb (73 kg)   Physical Exam HENT:     Head: Normocephalic and atraumatic.  Eyes:     Pupils: Pupils are equal, round, and reactive to light.  Neck:     Musculoskeletal: Normal range of motion.  Cardiovascular:     Rate and Rhythm: Normal rate and regular rhythm.     Heart sounds: Normal heart sounds.  Pulmonary:     Effort: Pulmonary effort is normal.     Breath sounds: Normal breath sounds.  Abdominal:     General: Bowel sounds are normal.     Palpations: Abdomen is soft.     Comments: Bilateral nephrostomy tubes in place  Musculoskeletal:     Right lower leg: Edema present.     Left lower leg: Edema present.  Skin:    General: Skin is warm and dry.  Neurological:     Mental Status: He is alert and oriented to person, place, and time.      CMP Latest Ref Rng & Units 05/07/2019  Glucose 70 - 99 mg/dL 108(H)  BUN 8 - 23 mg/dL 23  Creatinine 0.61 - 1.24 mg/dL 1.14  Sodium 135 - 145 mmol/L 137  Potassium 3.5 - 5.1 mmol/L 3.7  Chloride 98 - 111 mmol/L 105  CO2 22 - 32 mmol/L 23  Calcium 8.9 - 10.3 mg/dL 8.4(L)  Total Protein 6.5 - 8.1 g/dL 6.7  Total Bilirubin 0.3 - 1.2 mg/dL 0.3  Alkaline Phos 38 - 126 U/L 62  AST 15 - 41 U/L 11(L)  ALT 0 - 44 U/L 11   CBC Latest Ref Rng & Units 05/07/2019  WBC 4.0 - 10.5 K/uL 25.3(H)  Hemoglobin 13.0 - 17.0 g/dL 8.6(L)  Hematocrit 39.0 - 52.0 % 27.2(L)  Platelets 150 - 400 K/uL 404(H)    No images are attached to the encounter.  Ct Renal Stone Study  Result Date: 04/26/2019 CLINICAL DATA:  Fever. Back pain. Indwelling bilateral  nephrostomy tubes. EXAM: CT ABDOMEN AND PELVIS WITHOUT CONTRAST TECHNIQUE: Multidetector  CT imaging of the abdomen and pelvis was performed following the standard protocol without IV contrast. COMPARISON:  Noncontrast CT 04/08/2019. FINDINGS: Lower chest: Pleural effusion on prior exam have resolved. Decreased pericardial effusion, persistently measuring 11 mm adjacent to the right heart. Coronary artery calcifications. Hepatobiliary: No focal hepatic abnormality on noncontrast exam. Clips in the gallbladder fossa postcholecystectomy. No biliary dilatation. Pancreas: Parenchymal atrophy. No ductal dilatation or inflammation. Spleen: Normal in size without focal abnormality. Adrenals/Urinary Tract: Similar left adrenal thickening. Normal right adrenal gland. Right nephrostomy tube in place, tip in the renal pelvis. No fluid or inflammation along the nephrostomy tract. Decreased right hydronephrosis from prior exam. Persistent dilatation of the right renal collecting system and ureter to the level of the bladder. Resolved right perinephric stranding from prior. Simple cyst in the upper right kidney again seen. Left nephrostomy tube in place, tip in the renal pelvis. No abnormality along the nephrostomy tract. Minimal prominence of the left renal collecting system without frank hydronephrosis. Left ureter is prominent to the level of the bladder. Partially necrotic bladder tumor is not significantly changed from previous. Pelvic bowel loops again are intimately related to bladder mass. Stomach/Bowel: Small hiatal hernia. Stomach is decompressed. Duodenal diverticulum without inflammation. No small bowel obstruction. Pelvic small bowel loops are immediately related to necrotic bladder tumor. Normal appendix. Colonic tortuosity with moderate colonic stool burden. Stool distends the rectum. No colonic inflammation. Vascular/Lymphatic: Advanced aortic atherosclerosis. Small retroperitoneal lymph nodes all subcentimeter short axis. No definite pelvic adenopathy, lack of contrast limits detailed assessment.  Reproductive: Stable prostate involvement by large bladder lesion. Other: Mild presacral edema, unchanged. No free fluid. No free air. Tiny fat containing umbilical hernia. Musculoskeletal: Diffuse bony under mineralization. Multilevel degenerative change throughout the spine. Advanced degenerative change of both hips. Intramedullary rod with trans trochanteric screw partially included. IMPRESSION: 1. Bilateral nephrostomy tubes in place. Mild right hydronephrosis which is improved from most recent CT. Slight prominence of the left renal collecting system which has slightly increased. 2. 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 3. Resolved right pleural effusion. Decreased pericardial effusion from prior. 4.  Aortic Atherosclerosis (ICD10-I70.0). Electronically Signed   By: Keith Rake M.D.   On: 04/26/2019 02:33   Ct Renal Stone Study  Result Date: 04/08/2019 CLINICAL DATA:  84 year old male with lack of nephrostomy tube drainage for 24-36 hours with flank pain. Bladder carcinoma. EXAM: CT ABDOMEN AND PELVIS WITHOUT CONTRAST TECHNIQUE: Multidetector CT imaging of the abdomen and pelvis was performed following the standard protocol without IV contrast. COMPARISON:  CT Abdomen and Pelvis 03/22/2019 and earlier. FINDINGS: Lower chest: Small volume pericardial effusion is new since last month with simple fluid density. Small layering right pleural effusion is also new. No cardiomegaly. No new lung base opacity. Hepatobiliary: Surgically absent gallbladder. Negative noncontrast liver. Pancreas: Stable, atrophied. Spleen: Negative. Adrenals/Urinary Tract: Stable adrenal glands. Bilateral nephrostomy tubes. The left kidney is decompressed now compared to 03/22/2019. There is new right hydronephrosis. Mild right pararenal stranding is stable. No discontinuity or kinking of the right nephrostomy tube is identified. New right hydroureter. Partially necrotic urinary bladder tumor redemonstrated  and not significantly changed. There are distal small bowel loops inseparable from the abnormal urinary bladder. Stomach/Bowel: Increased retained stool throughout the large bowel with 6 centimeter stool ball in the rectum. Redundant sigmoid. Redundant transverse colon. No large bowel wall thickening. Appendix remains within normal limits. No dilated small bowel. Decompressed  stomach. No free air. No free fluid identified. Vascular/Lymphatic: Vascular patency is not evaluated in the absence of IV contrast. Extensive Aortoiliac calcified atherosclerosis. Reproductive: Stable. Other: No pelvic free fluid. Musculoskeletal: Stable visualized osseous structures. IMPRESSION: 1. New right hydronephrosis and hydroureter since 03/22/19, but right nephrostomy tube remains in place with no adverse features. The tube may be clogged. 2. New small pericardial and layering right pleural effusions. 3. The left kidney is decompressed since the prior with left nephrostomy tube in place. 4. Partially necrotic urinary bladder tumor not significantly changed. Note that this is inseparable from some small bowel loops. 5. Increased retained stool throughout the large bowel with stool ball in the rectum suspicious for fecal impaction. Electronically Signed   By: Genevie Ann M.D.   On: 04/08/2019 05:56   Ir Nephrostomy Exchange Left  Result Date: 04/19/2019 INDICATION: 84 year old male with bladder cancer and chronic indwelling bilateral percutaneous nephrostomy tubes. His last tube exchange was performed on 03/25/2019. He presents today, only 3 weeks later for tube exchange. EXAM: Bilateral nephrostomy tube exchange COMPARISON:  Prior tube exchange 03/25/2019 MEDICATIONS: None ANESTHESIA/SEDATION: None CONTRAST:  15 mL Isovue-300-administered into the collecting system(s) FLUOROSCOPY TIME:  Fluoroscopy Time: 0 minutes 48 seconds (14.2 mGy). COMPLICATIONS: None immediate. PROCEDURE: Informed written consent was obtained from the patient  after a thorough discussion of the procedural risks, benefits and alternatives. All questions were addressed. Maximal Sterile Barrier Technique was utilized including caps, mask, sterile gowns, sterile gloves, sterile drape, hand hygiene and skin antiseptic. A timeout was performed prior to the initiation of the procedure. Attention was first turned to the left percutaneous nephrostomy tube. The tube was imaged fluoroscopically. The tube is well formed. A gentle hand injection of contrast material confirms that the tube is within the renal pelvis. There is no evidence of hydronephrosis. The tube was cut and removed over a 75 cm Amplatz wire. The tube was mildly stained and encrusted despite having only been in place for less than 1 month. Therefore, the decision was made to upsized to a 12 French drainage catheter. A Cook 12 [place] all-purpose drainage catheter was then advanced over the wire and formed in the renal pelvis. A gentle hand injection of contrast material confirms the tube is again within the renal pelvis. The tube was flushed and connected to gravity bag drainage. Images were obtained and stored for the medical record. Attention was next turned to the right percutaneous nephrostomy tube. Using the exact same technique, the tube was confirmed to be well-positioned within the renal pelvis and then successfully exchanged for a new Cook 12 [place] all-purpose drainage catheter. Images were again stored for the medical record. IMPRESSION: Successful up size and exchange of bilateral percutaneous nephrostomy tubes. The new tubes are 76 [place] in an effort to prevent clogging. PLAN: Return for bilateral nephrostomy tube exchange in 6 weeks. Signed, Criselda Peaches, MD, [place] [facility] Electronically Signed   By: Jacqulynn Cadet M.D.   On: 04/19/2019 10:52   Ir Nephrostomy Exchange Right  Result Date: 04/19/2019 INDICATION: 84 year old male  with bladder cancer and chronic indwelling bilateral percutaneous nephrostomy tubes. His last tube exchange was performed on 03/25/2019. He presents today, only 3 weeks later for tube exchange. EXAM: Bilateral nephrostomy tube exchange COMPARISON:  Prior tube exchange 03/25/2019 MEDICATIONS: None ANESTHESIA/SEDATION: None CONTRAST:  15 mL Isovue-300-administered into the collecting system(s) FLUOROSCOPY TIME:  Fluoroscopy Time: 0 minutes 48 seconds (14.2 mGy). COMPLICATIONS: None immediate. PROCEDURE: Informed written  consent was obtained from the patient after a thorough discussion of the procedural risks, benefits and alternatives. All questions were addressed. Maximal Sterile Barrier Technique was utilized including caps, mask, sterile gowns, sterile gloves, sterile drape, hand hygiene and skin antiseptic. A timeout was performed prior to the initiation of the procedure. Attention was first turned to the left percutaneous nephrostomy tube. The tube was imaged fluoroscopically. The tube is well formed. A gentle hand injection of contrast material confirms that the tube is within the renal pelvis. There is no evidence of hydronephrosis. The tube was cut and removed over a 75 cm Amplatz wire. The tube was mildly stained and encrusted despite having only been in place for less than 1 month. Therefore, the decision was made to upsized to a 12 French drainage catheter. A Cook 12 [place] all-purpose drainage catheter was then advanced over the wire and formed in the renal pelvis. A gentle hand injection of contrast material confirms the tube is again within the renal pelvis. The tube was flushed and connected to gravity bag drainage. Images were obtained and stored for the medical record. Attention was next turned to the right percutaneous nephrostomy tube. Using the exact same technique, the tube was confirmed to be well-positioned within the renal pelvis and then successfully exchanged for a new Cook 12 [place]  all-purpose drainage catheter. Images were again stored for the medical record. IMPRESSION: Successful up size and exchange of bilateral percutaneous nephrostomy tubes. The new tubes are 25 [place] in an effort to prevent clogging. PLAN: Return for bilateral nephrostomy tube exchange in 6 weeks. Signed, Criselda Peaches, MD, [place] [facility] Radiology Electronically Signed   By: Jacqulynn Cadet M.D.   On: 04/19/2019 10:52     Assessment and plan- Patient is a 84 y.o. 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 He is here for on treatment assessment prior to the next cycle of Tecentriq  Treatment was on hold for about 6 weeks now due to hospitalization and declining performance status.  Patient seems to have improved and we will therefore proceed with the next cycle of Tecentriq today.  I will see him back in 3 weeks time with CBC with differential, CMP for cycle 11.  He has baseline leukocytosis possibly secondary to malignancy.  Continue to monitor  He is on Eliquis for right lower extremity DVT.  We will hold off on giving him any IV fluids today as his oral intake has been good and his labs are stable.  Patient's wife will call us if she feels there is need for any IV fluids over the next 3 weeks.   Visit Diagnosis 1. Encounter for antineoplastic immunotherapy   2. Malignant neoplasm of urinary bladder, unspecified site [facility])      Dr. Randa Evens, MD, MPH [facility] at [facility] [phone] 05/07/2019 12:38 PM

## 2019-05-13 ENCOUNTER — Telehealth: Payer: Self-pay

## 2019-05-13 NOTE — Telephone Encounter (Signed)
05/13/19 @ 1:24.Marland KitchenMarland KitchenWife informed to contact PCP.

## 2019-05-13 NOTE — Telephone Encounter (Signed)
Patient was prescribed Metoprolol 25mg  BID during last hospitalization and it does have 1 refill.  He did see PCP last week but they did not discuss Metoprolol.  Wife was wanting to know if he should continue this medication (wasn't sure if she was to contanct oncology or PCP).  They have not been checking BP or pulse at home but they doe have capabilities to do so.

## 2019-05-13 NOTE — Telephone Encounter (Signed)
She should get in touch with pcp

## 2019-05-14 ENCOUNTER — Other Ambulatory Visit: Payer: Self-pay | Admitting: Radiology

## 2019-05-14 DIAGNOSIS — N1339 Other hydronephrosis: Secondary | ICD-10-CM

## 2019-05-23 ENCOUNTER — Ambulatory Visit: Payer: Medicare Other

## 2019-05-28 ENCOUNTER — Encounter: Payer: Self-pay | Admitting: Oncology

## 2019-05-28 ENCOUNTER — Inpatient Hospital Stay (HOSPITAL_BASED_OUTPATIENT_CLINIC_OR_DEPARTMENT_OTHER): Payer: Medicare Other | Admitting: Oncology

## 2019-05-28 ENCOUNTER — Inpatient Hospital Stay: Payer: Medicare Other | Attending: Oncology | Admitting: *Deleted

## 2019-05-28 ENCOUNTER — Inpatient Hospital Stay: Payer: Medicare Other

## 2019-05-28 ENCOUNTER — Other Ambulatory Visit: Payer: Self-pay

## 2019-05-28 VITALS — BP 112/68 | HR 99 | Temp 97.4°F | Resp 16 | Ht 71.0 in | Wt 158.0 lb

## 2019-05-28 DIAGNOSIS — C679 Malignant neoplasm of bladder, unspecified: Secondary | ICD-10-CM | POA: Diagnosis present

## 2019-05-28 DIAGNOSIS — C61 Malignant neoplasm of prostate: Secondary | ICD-10-CM

## 2019-05-28 DIAGNOSIS — F419 Anxiety disorder, unspecified: Secondary | ICD-10-CM | POA: Insufficient documentation

## 2019-05-28 DIAGNOSIS — Z7901 Long term (current) use of anticoagulants: Secondary | ICD-10-CM | POA: Diagnosis not present

## 2019-05-28 DIAGNOSIS — Z5112 Encounter for antineoplastic immunotherapy: Secondary | ICD-10-CM | POA: Diagnosis not present

## 2019-05-28 DIAGNOSIS — Z95828 Presence of other vascular implants and grafts: Secondary | ICD-10-CM

## 2019-05-28 DIAGNOSIS — Z79899 Other long term (current) drug therapy: Secondary | ICD-10-CM | POA: Diagnosis not present

## 2019-05-28 DIAGNOSIS — I10 Essential (primary) hypertension: Secondary | ICD-10-CM | POA: Diagnosis not present

## 2019-05-28 DIAGNOSIS — C678 Malignant neoplasm of overlapping sites of bladder: Secondary | ICD-10-CM

## 2019-05-28 LAB — CBC WITH DIFFERENTIAL/PLATELET
Abs Immature Granulocytes: 0.11 10*3/uL — ABNORMAL HIGH (ref 0.00–0.07)
Basophils Absolute: 0.1 10*3/uL (ref 0.0–0.1)
Basophils Relative: 1 %
Eosinophils Absolute: 0.2 10*3/uL (ref 0.0–0.5)
Eosinophils Relative: 1 %
HCT: 29.1 % — ABNORMAL LOW (ref 39.0–52.0)
Hemoglobin: 9.2 g/dL — ABNORMAL LOW (ref 13.0–17.0)
Immature Granulocytes: 1 %
Lymphocytes Relative: 8 %
Lymphs Abs: 1.6 10*3/uL (ref 0.7–4.0)
MCH: 28.8 pg (ref 26.0–34.0)
MCHC: 31.6 g/dL (ref 30.0–36.0)
MCV: 91.2 fL (ref 80.0–100.0)
Monocytes Absolute: 1.2 10*3/uL — ABNORMAL HIGH (ref 0.1–1.0)
Monocytes Relative: 6 %
Neutro Abs: 16.3 10*3/uL — ABNORMAL HIGH (ref 1.7–7.7)
Neutrophils Relative %: 83 %
Platelets: 356 10*3/uL (ref 150–400)
RBC: 3.19 MIL/uL — ABNORMAL LOW (ref 4.22–5.81)
RDW: 15 % (ref 11.5–15.5)
WBC: 19.5 10*3/uL — ABNORMAL HIGH (ref 4.0–10.5)
nRBC: 0 % (ref 0.0–0.2)

## 2019-05-28 LAB — COMPREHENSIVE METABOLIC PANEL
ALT: 8 U/L (ref 0–44)
AST: 13 U/L — ABNORMAL LOW (ref 15–41)
Albumin: 2.7 g/dL — ABNORMAL LOW (ref 3.5–5.0)
Alkaline Phosphatase: 57 U/L (ref 38–126)
Anion gap: 11 (ref 5–15)
BUN: 23 mg/dL (ref 8–23)
CO2: 24 mmol/L (ref 22–32)
Calcium: 8.6 mg/dL — ABNORMAL LOW (ref 8.9–10.3)
Chloride: 100 mmol/L (ref 98–111)
Creatinine, Ser: 1.39 mg/dL — ABNORMAL HIGH (ref 0.61–1.24)
GFR calc Af Amer: 54 mL/min — ABNORMAL LOW (ref >60)
GFR calc non Af Amer: 47 mL/min — ABNORMAL LOW (ref >60)
Glucose, Bld: 120 mg/dL — ABNORMAL HIGH (ref 70–99)
Potassium: 3.6 mmol/L (ref 3.5–5.1)
Sodium: 135 mmol/L (ref 135–145)
Total Bilirubin: 0.4 mg/dL (ref 0.3–1.2)
Total Protein: 7.2 g/dL (ref 6.5–8.1)

## 2019-05-28 MED ORDER — SODIUM CHLORIDE 0.9 % IV SOLN
1200.0000 mg | Freq: Once | INTRAVENOUS | Status: AC
  Administered 2019-05-28: 11:00:00 1200 mg via INTRAVENOUS
  Filled 2019-05-28: qty 20

## 2019-05-28 MED ORDER — HEPARIN SOD (PORK) LOCK FLUSH 100 UNIT/ML IV SOLN
500.0000 [IU] | Freq: Once | INTRAVENOUS | Status: AC | PRN
  Administered 2019-05-28: 500 [IU]
  Filled 2019-05-28: qty 5

## 2019-05-28 MED ORDER — SODIUM CHLORIDE 0.9% FLUSH
10.0000 mL | Freq: Once | INTRAVENOUS | Status: AC
  Administered 2019-05-28: 10 mL via INTRAVENOUS
  Filled 2019-05-28: qty 10

## 2019-05-28 MED ORDER — SODIUM CHLORIDE 0.9 % IV SOLN
Freq: Once | INTRAVENOUS | Status: AC
  Administered 2019-05-28: 10:00:00 via INTRAVENOUS
  Filled 2019-05-28: qty 250

## 2019-05-28 NOTE — Progress Notes (Signed)
Pt doing ok, he got up and weighed today. He can't hear anything. He reads your lips. I called wife to get medical info for him

## 2019-05-28 NOTE — Progress Notes (Signed)
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 For his prostate cancer he has received IM RT in the past. He has been seeing Dr. Erlene Quan in the past for his recurrent UTIs as well as hematuria. CT scan in July 2018 showed bladder wall thickening with perivascular edema and inflammation in the right ureter greater than the left. Findings were thought to be due to pyelonephritis at that time. He underwent cystoscopy on 12/14/2017 which showed abnormal looking prostate with necrotic material lining the entire surface area. Diffuse copious debris within the bladder appearing to be erythematous without discrete bladder tumor but visualization was poor. He was then admitted to the hospital on 12/26/2017 with symptoms of UTI and sepsis as well as new moderate bilateral hydronephrosis.  CT abdomen and pelvis with contrast on 12/20/2017 again showed market bile bladder wall thickening with perivesicular edema. Within the lumen of the bladder there is a 93.9 cm soft tissue attenuating filling defect. This is indeterminate and could represent an area of blood clot versus urothelial lesion.  He underwent repeat cystoscopy with bilateral pyelogram and ureteral stent placement as well as TURBT and TURP. He was found to have a massive tumor involving the majority of the bladder with grossly necrotic and calcified material. There appeared  to be multifocal disease with large burden of the left anterior bladder wall extending posteriorly as well as adjacent to the bladder neck and beyond the right hemitrigone. Very little normal recognizable bladder mucosa remaining.   Biopsy from TURBT and TURP showed: High-grade urothelial carcinoma with extensive necrosisinvolving both the bladder and the prostate.CT chest did not reveal any evidence of metastatic disease. He was also not found to have any regional adenopathy on CT abdomen  Patient was seen by Dr. Erlene Quan and was not deemed to be a surgical candidate. He has been referred to [place] for definitive treatment options.  Patient lives with his wife at home and ambulates with a cane. He does need assistance with his ADLs to some extent. He has not had any falls and he denies any changes in his appetite or unintentional weight loss. Denies any pain. Reports some fatigue and occasional problems with constipation. He has had 3 hospitalizations last year for urinary tract infections and currently has a chronic Foley for the last 1 month  Dr. Erlene Quan performed interval TURBT on 01/15/18 and was able to debulk tumor as much as possible to reduce tumor burden  Patient received 5 cycles ofcarboplatin/ gemcitabine 2 weeks on and 1 week off ending 04/26/18.Patient did receive radiation for 10 days during cycle 2 of treatment. Given patients age, co-morbidities and frailty- he was not a cisplatin candidate.6th cycle not given due to fall and hip fracture  Patient had a repeat cystoscopy in September 2019 which showed no obvious tumor bladder but it did have a shaggy necrotic appearance at the bladder neck. Prostatic fossa was grossly abnormal necrotic and irregular without papillary change. Bladder neck was biopsied and showed residual high-grade urothelial carcinoma.  Muscularis propria was not seen in that specimen.Due to evidence of recurrent/residual disease patient was started  on Tecentriq on 07/26/2018   Interval history-patient always had difficulty hearing but over the last week or so patient's wife reports that he has completely lost his ability to hear and mainly understands lipreading or things have to be written out for him.  He continues to have a good appetite and ambulates around the house infrequently with the help of a walker  ECOG PS- 2 Pain scale- 0 Opioid associated constipation- no  Review of systems- Review of Systems  Constitutional: Positive for malaise/fatigue. Negative for chills, fever and weight loss.  HENT: Negative for congestion, ear discharge and nosebleeds.   Eyes: Negative for blurred vision.  Respiratory: Negative for cough, hemoptysis, sputum production, shortness of breath and wheezing.   Cardiovascular: Negative for chest pain, orthopnea and claudication.  Gastrointestinal: Negative for abdominal pain, blood in stool, constipation, diarrhea, heartburn, melena, nausea and vomiting.  Genitourinary: Negative for dysuria, flank pain, frequency, hematuria and urgency.  Musculoskeletal: Negative for back pain, joint pain and myalgias.  Skin: Negative for rash.  Neurological: Negative for dizziness, tingling, focal weakness, seizures, weakness and headaches.  Endo/Heme/Allergies: Does not bruise/bleed easily.  Psychiatric/Behavioral: Negative for depression and suicidal ideas. The patient does not have insomnia.       No Known Allergies   Past Medical History:  Diagnosis Date  . Anxiety   . Arthritis   . Bladder cancer ([place])   . Dysrhythmia 07/2018   history of atrial flutter that worsens with anxiety  . Femur fracture, right ([place]) 05/01/2018  . GERD (gastroesophageal reflux disease)   . History of recent blood transfusion 05/2018  . Hypertension   . Iron deficiency anemia 09/14/2018  . Prostate cancer ([place]) 07/2018   cancer growing in prostate but not prostate cancer, it is from the bladder  . Umbilical hernia 76/7209  .  Urinary retention 2019   foley catheter place 11/2017  . UTI (urinary tract infection) 2019   frequent UTI's over last year  . Wound eschar of foot 07/2018   left heal getting wrapped and requiring antibiotic cream. cracks open with weight bearing.     Past Surgical History:  Procedure Laterality Date  . CARPAL TUNNEL RELEASE Right   . CHOLECYSTECTOMY  2004  . CYSTOGRAM  07/18/2018   Procedure: CYSTOGRAM;  Surgeon: [name], MD;  Location: [facility];  Service: Urology;;  . Consuela Mimes W/ RETROGRADES Bilateral 07/18/2018   Procedure: CYSTOSCOPY WITH RETROGRADE PYELOGRAM;  Surgeon: [name], MD;  Location: [facility];  Service: Urology;  Laterality: Bilateral;  . CYSTOSCOPY W/ URETERAL STENT PLACEMENT Bilateral 12/27/2017   Procedure: CYSTOSCOPY WITH RETROGRADE PYELOGRAM/URETERAL STENT PLACEMENT;  Surgeon: [name], MD;  Location: [facility];  Service: Urology;  Laterality: Bilateral;  . CYSTOSCOPY W/ URETERAL STENT PLACEMENT Bilateral 07/18/2018   Procedure: CYSTOSCOPY WITH STENT REPLACEMENT (exchange);  Surgeon: [name], MD;  Location: [facility];  Service: Urology;  Laterality: Bilateral;  . CYSTOSCOPY WITH STENT PLACEMENT Bilateral 11/12/2018   Procedure: [place] WITH STENT Exchange;  Surgeon: [name], MD;  Location: [facility];  Service: Urology;  Laterality: Bilateral;  . INTRAMEDULLARY (IM) NAIL INTERTROCHANTERIC Right 05/02/2018   Procedure: INTRAMEDULLARY (IM) NAIL INTERTROCHANTRIC;  Surgeon: [name], MD;  Location: [facility];  Service: Orthopedics;  Laterality: Right;  . IR NEPHROSTOMY EXCHANGE LEFT  03/25/2019  . IR NEPHROSTOMY EXCHANGE LEFT  04/19/2019  . IR NEPHROSTOMY EXCHANGE RIGHT  03/25/2019  .  IR NEPHROSTOMY EXCHANGE RIGHT  04/19/2019  . IR NEPHROSTOMY PLACEMENT LEFT  02/23/2019  . IR NEPHROSTOMY PLACEMENT RIGHT  02/23/2019  . LEG TENDON SURGERY Right 1958  . PORTA CATH INSERTION N/A 01/22/2018   Procedure: PORTA CATH INSERTION;  Surgeon: [name], MD;  Location: [facility];  Service: Cardiovascular;  Laterality: N/A;  . TRANSURETHRAL RESECTION OF BLADDER TUMOR N/A 12/27/2017   Procedure: TRANSURETHRAL RESECTION OF BLADDER TUMOR (TURBT);  Surgeon: [name], MD;  Location: [facility];  Service: Urology;  Laterality: N/A;  . TRANSURETHRAL RESECTION OF BLADDER TUMOR N/A 01/15/2018   Procedure: TRANSURETHRAL RESECTION OF BLADDER TUMOR (TURBT);  Surgeon: [name], MD;  Location: [facility];  Service: Urology;  Laterality: N/A;  Need 2 hrs for this case please  . TRANSURETHRAL RESECTION OF BLADDER TUMOR N/A 07/18/2018   Procedure: TRANSURETHRAL RESECTION OF BLADDER TUMOR (TURBT);  Surgeon: [name], MD;  Location: [facility];  Service: Urology;  Laterality: N/A;    Social History   Socioeconomic History  . Marital status: Married    Spouse name: ruth  . Number of children: Not on file  . Years of education: Not on file  . Highest education level: Not on file  Occupational History  . Occupation: retired    Comment: Therapist, [employer]  Social Needs  . Financial resource strain: Not on file  . Food insecurity    Worry: Not on file    Inability: Not on file  . Transportation needs    Medical: Not on file    Non-medical: Not on file  Tobacco Use  . Smoking status: Never Smoker  . Smokeless tobacco: Never Used  Substance and Sexual Activity  . Alcohol use: No    Alcohol/week: 0.0 standard drinks  . Drug use: No  . Sexual activity: Not Currently  Lifestyle  . Physical activity    Days per week: Not on file    Minutes per session: Not on file  . Stress: Not on file  Relationships  . Social Herbalist on phone: Not on file    Gets together: Not on file    Attends religious service: Not on file    Active member of club or organization: Not on file    Attends meetings of clubs or organizations: Not on file    Relationship status: Not on file  . Intimate partner violence    Fear of current  or ex partner: Not on file    Emotionally abused: Not on file    Physically abused: Not on file    Forced sexual activity: Not on file  Other Topics Concern  . Not on file  Social History Narrative  . Not on file    Family History  Problem Relation Age of Onset  . Cancer Mother   . Chronic Renal Failure Mother   . Heart disease Father      Current Outpatient Medications:  .  acetaminophen (TYLENOL) 500 MG tablet, Take 1,000 mg by mouth every 6 (six) hours as needed for moderate pain or headache. , Disp: , Rfl:  .  cetirizine (ZYRTEC) 10 MG tablet, Take 10 mg by mouth daily as needed for allergies. , Disp: , Rfl:  .  Cranberry-Cholecalciferol (SUPER CRANBERRY/VITAMIN D3) 4200-500 MG-UNIT CAPS, Take 1 capsule by mouth 2 (two) times daily., Disp: , Rfl:  .  ELIQUIS 5 MG TABS tablet, TAKE ONE TABLET BY MOUTH TWICE DAILY (Patient taking differently: Take 5 mg  by mouth 2 (two) times daily. ), Disp: 60 tablet, Rfl: 3 .  ferrous sulfate 325 (65 FE) MG tablet, Take 1 tablet (325 mg total) by mouth 2 (two) times daily with a meal., Disp: , Rfl: 3 .  lidocaine-prilocaine (EMLA) cream, Apply 1 application topically as needed., Disp: 30 g, Rfl: 1 .  metoprolol tartrate (LOPRESSOR) 25 MG tablet, Take 1 tablet (25 mg total) by mouth 2 (two) times daily., Disp: 30 tablet, Rfl: 1 .  Multiple Vitamin (MULTIVITAMIN WITH MINERALS) TABS tablet, Take 1 tablet by mouth daily., Disp: , Rfl:  .  MYRBETRIQ 50 MG TB24 tablet, TAKE 1 TABLET BY MOUTH DAILY, Disp: 30 tablet, Rfl: 3 .  nystatin (NYAMYC) powder, Apply topically 2 (two) times daily., Disp: 60 g, Rfl: 3 .  oxyCODONE (OXY IR/ROXICODONE) 5 MG immediate release tablet, Take 1 tablet (5 mg total) by mouth every 4 (four) hours as needed for severe pain., Disp: 60 tablet, Rfl: 0 .  polyethylene glycol powder (MIRALAX) powder, Take 17 g by mouth daily as needed. Can increase to 3 times a day as needed for constipation but hold medication if has diarrhea, Disp:  255 g, Rfl: 0 .  vitamin B-12 (CYANOCOBALAMIN) 500 MCG tablet, Take 500 mcg by mouth daily., Disp: , Rfl:  .  zinc oxide 20 % ointment, Apply 1 application topically as needed for irritation., Disp: , Rfl:   Physical exam:  Vitals:   05/28/19 0921  BP: 112/68  Pulse: 99  Resp: 16  Temp: (!) 97.4 F (36.3 C)  TempSrc: Tympanic  Weight: 158 lb (71.7 kg)  Height: 5\' 11"  (1.803 m)   Physical Exam Constitutional:      Comments: Frail elderly gentleman sitting in a wheelchair.  Appears in no acute distress  HENT:     Head: Normocephalic and atraumatic.  Eyes:     Pupils: Pupils are equal, round, and reactive to light.  Neck:     Musculoskeletal: Normal range of motion.  Cardiovascular:     Rate and Rhythm: Normal rate and regular rhythm.     Heart sounds: Normal heart sounds.  Pulmonary:     Effort: Pulmonary effort is normal.     Breath sounds: Normal breath sounds.  Abdominal:     General: Bowel sounds are normal.     Palpations: Abdomen is soft.     Comments: Bilateral nephrostomy tubes in place draining clear urine  Musculoskeletal:     Right lower leg: Edema present.     Left lower leg: Edema present.  Skin:    General: Skin is warm and dry.  Neurological:     Mental Status: He is alert and oriented to person, place, and time.      CMP Latest Ref Rng & Units 05/28/2019  Glucose 70 - 99 mg/dL 120(H)  BUN 8 - 23 mg/dL 23  Creatinine 0.61 - 1.24 mg/dL 1.39(H)  Sodium 135 - 145 mmol/L 135  Potassium 3.5 - 5.1 mmol/L 3.6  Chloride 98 - 111 mmol/L 100  CO2 22 - 32 mmol/L 24  Calcium 8.9 - 10.3 mg/dL 8.6(L)  Total Protein 6.5 - 8.1 g/dL 7.2  Total Bilirubin 0.3 - 1.2 mg/dL 0.4  Alkaline Phos 38 - 126 U/L 57  AST 15 - 41 U/L 13(L)  ALT 0 - 44 U/L 8   CBC Latest Ref Rng & Units 05/28/2019  WBC 4.0 - 10.5 K/uL 19.5(H)  Hemoglobin 13.0 - 17.0 g/dL 9.2(L)  Hematocrit 39.0 - 52.0 %  29.1(L)  Platelets 150 - 400 K/uL 356      Assessment and plan- Patient is a 84  y.o. 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  Counts okay to proceed with cycle 11 of Tecentriq today.  His platelet count has normalized and his white count has come down to 19 as compared to 30s to 40s therefore.  Hemoglobin continues to remain stable in fact improved after recent hospitalization.  He will continue to get Tecentriq every 3 weeks until progression or toxicity.  Recent CT scan of the abdomen in June 2020 did not show any evidence of metastatic disease in the abdomen.  Plan to get repeat CT chest abdomen and pelvis with contrast in September 2020.  I will see him back in 3 weeks time with port labs CBC with differential, CMP and TSH for the next cycle of Tecentriq.  Patient is due for nephrostomy tube exchange later this week.  I will refer him to ENT to evaluate his ongoing hearing loss which I do not think is related to his underlying malignancy or immunotherapy   Visit Diagnosis 1. Malignant neoplasm of prostate ([place])   2. Encounter for antineoplastic immunotherapy      Dr. Randa Evens, MD, MPH [facility] at [facility] [phone] 05/28/2019 12:51 PM

## 2019-05-31 ENCOUNTER — Other Ambulatory Visit: Payer: Self-pay

## 2019-05-31 ENCOUNTER — Ambulatory Visit
Admission: RE | Admit: 2019-05-31 | Discharge: 2019-05-31 | Disposition: A | Discharge status: Home / Self Care | Payer: Medicare Other | Source: Ambulatory Visit | Attending: Urology | Admitting: Urology

## 2019-05-31 ENCOUNTER — Encounter: Payer: Self-pay | Admitting: Interventional Radiology

## 2019-05-31 DIAGNOSIS — Z436 Encounter for attention to other artificial openings of urinary tract: Secondary | ICD-10-CM | POA: Diagnosis not present

## 2019-05-31 DIAGNOSIS — N1339 Other hydronephrosis: Secondary | ICD-10-CM | POA: Insufficient documentation

## 2019-05-31 HISTORY — PX: IR NEPHROSTOMY EXCHANGE LEFT: IMG6069

## 2019-05-31 HISTORY — PX: IR NEPHROSTOMY EXCHANGE RIGHT: IMG6070

## 2019-05-31 MED ORDER — IODIXANOL 320 MG/ML IV SOLN
50.0000 mL | Freq: Once | INTRAVENOUS | Status: AC | PRN
  Administered 2019-05-31: 10 mL via INTRAVENOUS

## 2019-05-31 MED ORDER — LIDOCAINE HCL (PF) 1 % IJ SOLN
INTRAMUSCULAR | Status: AC
  Filled 2019-05-31: qty 30

## 2019-05-31 NOTE — Progress Notes (Signed)
Patient and wife stated understanding of discharge instructions with no further questions.

## 2019-05-31 NOTE — Procedures (Signed)
Pre Procedure Dx: Hydronephrosis Post Procedure Dx: Same  Successful bilateral PCN exchange.    EBL: None   No immediate complications.   Jay Dyanara Cozza, MD Pager #: [phone]  

## 2019-06-01 ENCOUNTER — Other Ambulatory Visit: Payer: Self-pay | Admitting: Internal Medicine

## 2019-06-05 ENCOUNTER — Telehealth: Payer: Self-pay | Admitting: *Deleted

## 2019-06-05 NOTE — Telephone Encounter (Signed)
I was checking up on a hearing test referral on pt.and when I called ENT they did not get referral. I have faxed it again today to see if they are able to get it and give pt. Appt. He probably needs hearing aids since his hearing is so bad

## 2019-06-06 ENCOUNTER — Emergency Department: Payer: Medicare Other

## 2019-06-06 ENCOUNTER — Other Ambulatory Visit: Payer: Self-pay

## 2019-06-06 ENCOUNTER — Inpatient Hospital Stay
Admission: EM | Admit: 2019-06-06 | Discharge: 2019-06-12 | DRG: 329 | Disposition: A | Discharge status: Skilled Nursing Facility | Payer: Medicare Other | Attending: Internal Medicine | Admitting: Internal Medicine

## 2019-06-06 DIAGNOSIS — M1990 Unspecified osteoarthritis, unspecified site: Secondary | ICD-10-CM | POA: Diagnosis present

## 2019-06-06 DIAGNOSIS — C678 Malignant neoplasm of overlapping sites of bladder: Secondary | ICD-10-CM | POA: Diagnosis not present

## 2019-06-06 DIAGNOSIS — I4892 Unspecified atrial flutter: Secondary | ICD-10-CM | POA: Diagnosis present

## 2019-06-06 DIAGNOSIS — R109 Unspecified abdominal pain: Secondary | ICD-10-CM | POA: Diagnosis not present

## 2019-06-06 DIAGNOSIS — Z66 Do not resuscitate: Secondary | ICD-10-CM | POA: Diagnosis present

## 2019-06-06 DIAGNOSIS — K449 Diaphragmatic hernia without obstruction or gangrene: Secondary | ICD-10-CM | POA: Diagnosis present

## 2019-06-06 DIAGNOSIS — F419 Anxiety disorder, unspecified: Secondary | ICD-10-CM | POA: Diagnosis present

## 2019-06-06 DIAGNOSIS — N136 Pyonephrosis: Secondary | ICD-10-CM | POA: Diagnosis present

## 2019-06-06 DIAGNOSIS — Z79891 Long term (current) use of opiate analgesic: Secondary | ICD-10-CM

## 2019-06-06 DIAGNOSIS — H9190 Unspecified hearing loss, unspecified ear: Secondary | ICD-10-CM | POA: Diagnosis present

## 2019-06-06 DIAGNOSIS — E876 Hypokalemia: Secondary | ICD-10-CM | POA: Diagnosis present

## 2019-06-06 DIAGNOSIS — R531 Weakness: Secondary | ICD-10-CM

## 2019-06-06 DIAGNOSIS — I700 Atherosclerosis of aorta: Secondary | ICD-10-CM | POA: Diagnosis present

## 2019-06-06 DIAGNOSIS — R54 Age-related physical debility: Secondary | ICD-10-CM | POA: Diagnosis present

## 2019-06-06 DIAGNOSIS — I10 Essential (primary) hypertension: Secondary | ICD-10-CM | POA: Diagnosis present

## 2019-06-06 DIAGNOSIS — Z20828 Contact with and (suspected) exposure to other viral communicable diseases: Secondary | ICD-10-CM | POA: Diagnosis present

## 2019-06-06 DIAGNOSIS — I2510 Atherosclerotic heart disease of native coronary artery without angina pectoris: Secondary | ICD-10-CM | POA: Diagnosis present

## 2019-06-06 DIAGNOSIS — C679 Malignant neoplasm of bladder, unspecified: Secondary | ICD-10-CM | POA: Diagnosis present

## 2019-06-06 DIAGNOSIS — K567 Ileus, unspecified: Secondary | ICD-10-CM | POA: Diagnosis not present

## 2019-06-06 DIAGNOSIS — E43 Unspecified severe protein-calorie malnutrition: Secondary | ICD-10-CM | POA: Insufficient documentation

## 2019-06-06 DIAGNOSIS — Z515 Encounter for palliative care: Secondary | ICD-10-CM | POA: Diagnosis present

## 2019-06-06 DIAGNOSIS — K5939 Other megacolon: Secondary | ICD-10-CM | POA: Diagnosis present

## 2019-06-06 DIAGNOSIS — Z7901 Long term (current) use of anticoagulants: Secondary | ICD-10-CM | POA: Diagnosis not present

## 2019-06-06 DIAGNOSIS — Z6823 Body mass index (BMI) 23.0-23.9, adult: Secondary | ICD-10-CM

## 2019-06-06 DIAGNOSIS — K219 Gastro-esophageal reflux disease without esophagitis: Secondary | ICD-10-CM | POA: Diagnosis present

## 2019-06-06 DIAGNOSIS — Z87440 Personal history of urinary (tract) infections: Secondary | ICD-10-CM

## 2019-06-06 DIAGNOSIS — C689 Malignant neoplasm of urinary organ, unspecified: Secondary | ICD-10-CM | POA: Diagnosis not present

## 2019-06-06 DIAGNOSIS — Z8249 Family history of ischemic heart disease and other diseases of the circulatory system: Secondary | ICD-10-CM

## 2019-06-06 DIAGNOSIS — Z7189 Other specified counseling: Secondary | ICD-10-CM | POA: Diagnosis not present

## 2019-06-06 DIAGNOSIS — Z79899 Other long term (current) drug therapy: Secondary | ICD-10-CM

## 2019-06-06 DIAGNOSIS — Z8546 Personal history of malignant neoplasm of prostate: Secondary | ICD-10-CM | POA: Diagnosis not present

## 2019-06-06 DIAGNOSIS — R1084 Generalized abdominal pain: Secondary | ICD-10-CM

## 2019-06-06 DIAGNOSIS — K9189 Other postprocedural complications and disorders of digestive system: Secondary | ICD-10-CM | POA: Diagnosis not present

## 2019-06-06 DIAGNOSIS — K56609 Unspecified intestinal obstruction, unspecified as to partial versus complete obstruction: Principal | ICD-10-CM | POA: Diagnosis present

## 2019-06-06 LAB — COMPREHENSIVE METABOLIC PANEL
ALT: 7 U/L (ref 0–44)
AST: 11 U/L — ABNORMAL LOW (ref 15–41)
Albumin: 2.7 g/dL — ABNORMAL LOW (ref 3.5–5.0)
Alkaline Phosphatase: 75 U/L (ref 38–126)
Anion gap: 12 (ref 5–15)
BUN: 25 mg/dL — ABNORMAL HIGH (ref 8–23)
CO2: 22 mmol/L (ref 22–32)
Calcium: 8.4 mg/dL — ABNORMAL LOW (ref 8.9–10.3)
Chloride: 100 mmol/L (ref 98–111)
Creatinine, Ser: 1.38 mg/dL — ABNORMAL HIGH (ref 0.61–1.24)
GFR calc Af Amer: 55 mL/min — ABNORMAL LOW (ref >60)
GFR calc non Af Amer: 47 mL/min — ABNORMAL LOW (ref >60)
Glucose, Bld: 169 mg/dL — ABNORMAL HIGH (ref 70–99)
Potassium: 2.2 mmol/L — CL (ref 3.5–5.1)
Sodium: 134 mmol/L — ABNORMAL LOW (ref 135–145)
Total Bilirubin: 0.7 mg/dL (ref 0.3–1.2)
Total Protein: 6.6 g/dL (ref 6.5–8.1)

## 2019-06-06 LAB — URINALYSIS, COMPLETE (UACMP) WITH MICROSCOPIC
Bacteria, UA: NONE SEEN
Bilirubin Urine: NEGATIVE
Glucose, UA: NEGATIVE mg/dL
Ketones, ur: NEGATIVE mg/dL
Nitrite: NEGATIVE
Protein, ur: 100 mg/dL — AB
RBC / HPF: >50 RBC/hpf — ABNORMAL HIGH (ref 0–5)
Specific Gravity, Urine: 1.045 — ABNORMAL HIGH (ref 1.005–1.030)
Squamous Epithelial / HPF: NONE SEEN (ref 0–5)
WBC, UA: >50 WBC/hpf — ABNORMAL HIGH (ref 0–5)
pH: 5 (ref 5.0–8.0)

## 2019-06-06 LAB — PROTIME-INR
INR: 1.5 — ABNORMAL HIGH (ref 0.8–1.2)
Prothrombin Time: 17.7 seconds — ABNORMAL HIGH (ref 11.4–15.2)

## 2019-06-06 LAB — TROPONIN I (HIGH SENSITIVITY)
Troponin I (High Sensitivity): 47 ng/L — ABNORMAL HIGH (ref <18)
Troponin I (High Sensitivity): 34 ng/L — ABNORMAL HIGH (ref <18)

## 2019-06-06 LAB — CBC
HCT: 30.1 % — ABNORMAL LOW (ref 39.0–52.0)
Hemoglobin: 9.7 g/dL — ABNORMAL LOW (ref 13.0–17.0)
MCH: 28.6 pg (ref 26.0–34.0)
MCHC: 32.2 g/dL (ref 30.0–36.0)
MCV: 88.8 fL (ref 80.0–100.0)
Platelets: 547 10*3/uL — ABNORMAL HIGH (ref 150–400)
RBC: 3.39 MIL/uL — ABNORMAL LOW (ref 4.22–5.81)
RDW: 14.9 % (ref 11.5–15.5)
WBC: 23.5 10*3/uL — ABNORMAL HIGH (ref 4.0–10.5)
nRBC: 0 % (ref 0.0–0.2)

## 2019-06-06 LAB — SARS CORONAVIRUS 2 BY RT PCR (HOSPITAL ORDER, PERFORMED IN ~~LOC~~ HOSPITAL LAB): SARS Coronavirus 2: NEGATIVE

## 2019-06-06 LAB — LIPASE, BLOOD: Lipase: 15 U/L (ref 11–51)

## 2019-06-06 LAB — APTT: aPTT: 42 seconds — ABNORMAL HIGH (ref 24–36)

## 2019-06-06 LAB — LACTIC ACID, PLASMA: Lactic Acid, Venous: 1.6 mmol/L (ref 0.5–1.9)

## 2019-06-06 MED ORDER — SODIUM CHLORIDE 0.9 % IV BOLUS
1000.0000 mL | Freq: Once | INTRAVENOUS | Status: AC
  Administered 2019-06-06: 22:00:00 1000 mL via INTRAVENOUS

## 2019-06-06 MED ORDER — IOHEXOL 300 MG/ML  SOLN
100.0000 mL | Freq: Once | INTRAMUSCULAR | Status: AC | PRN
  Administered 2019-06-06: 20:00:00 100 mL via INTRAVENOUS

## 2019-06-06 MED ORDER — PIPERACILLIN-TAZOBACTAM 3.375 G IVPB 30 MIN
3.3750 g | Freq: Once | INTRAVENOUS | Status: AC
  Administered 2019-06-06: 22:00:00 3.375 g via INTRAVENOUS
  Filled 2019-06-06: qty 50

## 2019-06-06 MED ORDER — ONDANSETRON HCL 4 MG/2ML IJ SOLN
4.0000 mg | Freq: Once | INTRAMUSCULAR | Status: AC
  Administered 2019-06-06: 20:00:00 4 mg via INTRAVENOUS
  Filled 2019-06-06: qty 2

## 2019-06-06 MED ORDER — POTASSIUM CHLORIDE 10 MEQ/100ML IV SOLN
10.0000 meq | INTRAVENOUS | Status: AC
  Administered 2019-06-06 – 2019-06-07 (×3): 10 meq via INTRAVENOUS
  Filled 2019-06-06 (×5): qty 100

## 2019-06-06 NOTE — ED Provider Notes (Addendum)
[facility] Emergency Department Provider Note  ____________________________________________   First MD Initiated Contact with Patient 06/06/19 1940     (approximate)  I have reviewed the triage vital signs and the nursing notes.   HISTORY  Chief Complaint No chief complaint on file.    HPI Vincent Poole is a 84 y.o. male with aflutter, dvt on blood thinner, bladder cancer with PCN drains who presents with vomiting.  Patient is had intermittent vomiting for the past 5 days.  Nothing in general seems to bring it on, nothing makes it worse.  He is not been eating well secondary to feeling nauseous.  He has had some intermittent abdominal pain as well.  No fevers no chest pain or shortness of breath.  Patient had admission 6/22 due to sepsis secondary to recurrent UTI with chronic bilateral nephrectomy tubes and chronic Foley catheter.  UTI was positive for Pseudomonas.          Past Medical History:  Diagnosis Date   Anxiety    Arthritis    Bladder cancer ([place])    Dysrhythmia 07/2018   history of atrial flutter that worsens with anxiety   Femur fracture, right ([place]) 05/01/2018   GERD (gastroesophageal reflux disease)    History of recent blood transfusion 05/2018   Hypertension    Iron deficiency anemia 09/14/2018   Prostate cancer ([place]) 07/2018   cancer growing in prostate but not prostate cancer, it is from the bladder   Umbilical hernia 81/0175   Urinary retention 2019   foley catheter place 11/2017   UTI (urinary tract infection) 2019   frequent UTI's over last year   Wound eschar of foot 07/2018   left heal getting wrapped and requiring antibiotic cream. cracks open with weight bearing.    Patient Active Problem List   Diagnosis Date Noted   Hydronephrosis, right 04/08/2019   Pressure injury of skin 03/23/2019   SIRS (systemic inflammatory response syndrome) ([place]) 03/22/2019   Leukocytosis 08/31/8526    Complicated UTI (urinary tract infection) 11/14/2018   Palliative care encounter    Iron deficiency anemia 09/14/2018   Hip fracture ([place]) 05/01/2018   Malignant neoplasm of urinary bladder ([place]) 01/12/2018   Goals of care, counseling/discussion 01/12/2018   Pyelonephritis 05/31/2017   UTI (urinary tract infection) 03/04/2017   Urinary obstruction    Essential hypertension 08/30/2016   History of shingles 08/30/2016   Prostate cancer ([place]) 08/30/2016   Urinary retention 08/30/2016   Medicare annual wellness visit, initial 08/01/2016   Medicare annual wellness visit, subsequent 08/01/2016   Sepsis ([place]) 07/11/2016   Borderline diabetes mellitus 01/26/2016   Vaccine counseling 01/26/2015   Lumbar radiculitis 06/24/2014   OA (osteoarthritis) of hip 06/24/2014   Malignant neoplasm of prostate ([place]) 01/27/2011    Past Surgical History:  Procedure Laterality Date   CARPAL TUNNEL RELEASE Right    CHOLECYSTECTOMY  2004   CYSTOGRAM  07/18/2018   Procedure: CYSTOGRAM;  Surgeon: [name], MD;  Location: [facility];  Service: Urology;;   Consuela Mimes W/ RETROGRADES Bilateral 07/18/2018   Procedure: CYSTOSCOPY WITH RETROGRADE PYELOGRAM;  Surgeon: [name], MD;  Location: [facility];  Service: Urology;  Laterality: Bilateral;   CYSTOSCOPY W/ URETERAL STENT PLACEMENT Bilateral 12/27/2017   Procedure: CYSTOSCOPY WITH RETROGRADE PYELOGRAM/URETERAL STENT PLACEMENT;  Surgeon: [name], MD;  Location: [facility];  Service: Urology;  Laterality: Bilateral;   CYSTOSCOPY W/ URETERAL STENT PLACEMENT Bilateral 07/18/2018   Procedure: CYSTOSCOPY WITH STENT REPLACEMENT (exchange);  Surgeon:  [name], MD;  Location: [facility];  Service: Urology;  Laterality: Bilateral;   CYSTOSCOPY WITH STENT PLACEMENT Bilateral 11/12/2018   Procedure: [place] WITH STENT Exchange;  Surgeon: [name], MD;  Location: [facility];  Service: Urology;  Laterality: Bilateral;    INTRAMEDULLARY (IM) NAIL INTERTROCHANTERIC Right 05/02/2018   Procedure: INTRAMEDULLARY (IM) NAIL INTERTROCHANTRIC;  Surgeon: [name], MD;  Location: [facility];  Service: Orthopedics;  Laterality: Right;   IR NEPHROSTOMY EXCHANGE LEFT  03/25/2019   IR NEPHROSTOMY EXCHANGE LEFT  04/19/2019   IR NEPHROSTOMY EXCHANGE LEFT  05/31/2019   IR NEPHROSTOMY EXCHANGE RIGHT  03/25/2019   IR NEPHROSTOMY EXCHANGE RIGHT  04/19/2019   IR NEPHROSTOMY EXCHANGE RIGHT  05/31/2019   IR NEPHROSTOMY PLACEMENT LEFT  02/23/2019   IR NEPHROSTOMY PLACEMENT RIGHT  02/23/2019   LEG TENDON SURGERY Right 1958   PORTA CATH INSERTION N/A 01/22/2018   Procedure: PORTA CATH INSERTION;  Surgeon: [name], MD;  Location: [facility];  Service: Cardiovascular;  Laterality: N/A;   TRANSURETHRAL RESECTION OF BLADDER TUMOR N/A 12/27/2017   Procedure: TRANSURETHRAL RESECTION OF BLADDER TUMOR (TURBT);  Surgeon: [name], MD;  Location: [facility];  Service: Urology;  Laterality: N/A;   TRANSURETHRAL RESECTION OF BLADDER TUMOR N/A 01/15/2018   Procedure: TRANSURETHRAL RESECTION OF BLADDER TUMOR (TURBT);  Surgeon: [name], MD;  Location: [facility];  Service: Urology;  Laterality: N/A;  Need 2 hrs for this case please   TRANSURETHRAL RESECTION OF BLADDER TUMOR N/A 07/18/2018   Procedure: TRANSURETHRAL RESECTION OF BLADDER TUMOR (TURBT);  Surgeon: [name], MD;  Location: [facility];  Service: Urology;  Laterality: N/A;    Prior to Admission medications   Medication Sig Start Date End Date Taking? Authorizing Provider  acetaminophen (TYLENOL) 500 MG tablet Take 1,000 mg by mouth every 6 (six) hours as needed for moderate pain or headache.     [provider]  cetirizine (ZYRTEC) 10 MG tablet Take 10 mg by mouth daily as needed for allergies.     [provider]  Cranberry-Cholecalciferol (SUPER CRANBERRY/VITAMIN D3) 4200-500 MG-UNIT CAPS Take 1 capsule by mouth 2 (two) times daily.     [provider]  ELIQUIS 5 MG TABS tablet TAKE ONE TABLET BY MOUTH TWICE DAILY 06/03/19   Sindy Guadeloupe, MD  ferrous sulfate 325 (65 FE) MG tablet Take 1 tablet (325 mg total) by mouth 2 (two) times daily with a meal. 05/06/18   Gouru, Aruna, MD  lidocaine-prilocaine (EMLA) cream Apply 1 application topically as needed. 01/02/19   Sindy Guadeloupe, MD  metoprolol tartrate (LOPRESSOR) 25 MG tablet Take 1 tablet (25 mg total) by mouth 2 (two) times daily. 04/29/19   Fritzi Mandes, MD  Multiple Vitamin (MULTIVITAMIN WITH MINERALS) TABS tablet Take 1 tablet by mouth daily.    [provider]  MYRBETRIQ 50 MG TB24 tablet TAKE 1 TABLET BY MOUTH DAILY 04/30/19   Ernestine Conrad, Larene Beach A, PA-C  nystatin [facility]) powder Apply topically 2 (two) times daily. 09/28/18   Zara Council A, PA-C  oxyCODONE (OXY IR/ROXICODONE) 5 MG immediate release tablet Take 1 tablet (5 mg total) by mouth every 4 (four) hours as needed for severe pain. 03/21/19   Sindy Guadeloupe, MD  polyethylene glycol powder (MIRALAX) powder Take 17 g by mouth daily as needed. Can increase to 3 times a day as needed for constipation but hold medication if has diarrhea 01/30/18   Sindy Guadeloupe, MD  vitamin B-12 (CYANOCOBALAMIN)  500 MCG tablet Take 500 mcg by mouth daily.    [provider]  zinc oxide 20 % ointment Apply 1 application topically as needed for irritation.    [provider]    Allergies Patient has no known allergies.  Family History  Problem Relation Age of Onset   Cancer Mother    Chronic Renal Failure Mother    Heart disease Father     Social History Social History   Tobacco Use   Smoking status: Never Smoker   Smokeless tobacco: Never Used  Substance Use Topics   Alcohol use: No    Alcohol/week: 0.0 standard drinks   Drug use: No      Review of Systems Constitutional: No fever/chills Eyes: No visual changes. ENT: No sore throat. Cardiovascular: Denies chest  pain. Respiratory: Denies shortness of breath. Gastrointestinal: +abd pain, nausea/vomiting, constipation  Genitourinary: Negative for dysuria. Musculoskeletal: Negative for back pain. Skin: Negative for rash. Neurological: Negative for headaches, focal weakness or numbness. All other ROS negative ____________________________________________   PHYSICAL EXAM:  VITAL SIGNS: Blood pressure 125/70, pulse 90, temperature (!) 97.5 F (36.4 C), temperature source Oral, resp. rate 16, height 5\' 9"  (1.753 m), weight 69.9 kg, SpO2 99 %. Constitutional: Alert and oriented. Well appearing and in no acute distress. Hard of hearing  Eyes: Conjunctivae are normal. EOMI. Head: Atraumatic. Nose: No congestion/rhinnorhea. Mouth/Throat: Mucous membranes are moist.   Neck: No stridor. Trachea Midline. FROM Cardiovascular: Normal rate, regular rhythm. Grossly normal heart sounds.  Good peripheral circulation. Respiratory: Normal respiratory effort.  No retractions. Lungs CTAB. Gastrointestinal: slightly distended, nontender. No distention. No abdominal bruits. PCN drains in place Musculoskeletal: No lower extremity tenderness nor edema.  No joint effusions. Neurologic:  Normal speech and language. No gross focal neurologic deficits are appreciated.  Skin:  Skin is warm, dry and intact. No rash noted. Psychiatric: Mood and affect are normal. Speech and behavior are normal. GU: Deferred   ____________________________________________   LABS (all labs ordered are listed, but only abnormal results are displayed)  Labs Reviewed - No data to display ____________________________________________   ED ECG REPORT I, Vanessa Ector, the attending physician, personally viewed and interpreted this ECG.  EKG-sinus tachycardia, inferior-lat t wave inversion, no st elevation, normal intervals ____________________________________________  RADIOLOGY   Official radiology report(s): Ct Abdomen Pelvis W  Contrast  Result Date: 06/06/2019 CLINICAL DATA:  Patient c/o abdominal pain, emesis, constipation, weakness. Patient is a cancer patient with weekly treatments in the cancer center (unsure if chemo). Patient has bilateral nephrostomy tubes. EXAM: CT ABDOMEN AND PELVIS WITH CONTRAST TECHNIQUE: Multidetector CT imaging of the abdomen and pelvis was performed using the standard protocol following bolus administration of intravenous contrast. CONTRAST:  119mL OMNIPAQUE IOHEXOL 300 MG/ML  SOLN COMPARISON:  CT of the abdomen and pelvis on 04/16/2019, 02/21/2019 FINDINGS: Lower chest: Extensive coronary artery calcification. Pericardial thickening or small effusion. Small hiatal hernia. Hepatobiliary: Cholecystectomy. Liver is otherwise unremarkable. Pancreas: Atrophic pancreas normal in appearance. Spleen: Normal in size without focal abnormality. Adrenals/Urinary Tract: Adrenal glands are normal. Bilateral percutaneous nephrostomy catheters. Stable RIGHT renal cyst. Ureters are decompressed. Irregular soft tissue thickening of the urinary bladder wall, associated with surrounding inflammatory changes. There is a broad interface with the anterior abdominal wall, suspicious for involvement of the anterior abdominal wall. Stomach/Bowel: Hiatal hernia. Stomach is unremarkable. Small bowel loops are not dilated. The appendix is distended and contains appendicoliths. Appendix measures 13 millimeters in thickness. There is new, significant dilatation of the  colon. Cecum measures 9.0 centimeters. The colon is dilated and stool filled throughout its course to level of the descending colon where there is a beak adjacent to the bladder mass, best seen on image 32/5. This abrupt transition zone is consistent with malignant adhesion or mass effects and causing significant obstruction. A second possible narrowing is also identified within the sigmoid colon best seen on coronal image 19/5. There is no evidence for free  intraperitoneal air. Vascular/Lymphatic: Dense atherosclerotic calcifications of the abdominal aorta. No aneurysm. Reproductive: Prostatic calcifications. Previous TURP. Other: No ascites. Small amount of fluid within the RIGHT inguinal canal. Musculoskeletal: Degenerative changes are seen spine and hips, RIGHT greater than LEFT. Previous ORIF of the RIGHT hip. Significant degenerative changes and postoperative IMPRESSION: 1. Large bowel obstruction likely related to bladder tumor. Sharp transition zone in the LOWER descending colon is consistent with malignant adhesion, direct invasion by the tumor, or mass effect from the tumor. Cecum measures up to 9 centimeters. 2. Suspect involvement of the anterior abdominal wall by bladder tumor. 3. No evidence for free intraperitoneal air. 4. Bilateral percutaneous nephrostomy catheters. 5. Hiatal hernia. 6. Coronary artery disease. 7. Previous TURP. 8. Aortic Atherosclerosis (ICD10-I70.0). These results were called by telephone at the time of interpretation on 06/06/2019 at 8:58 pm to Dr. Marjean Donna , who verbally acknowledged these results. Electronically Signed   By: Nolon Nations M.D.   On: 06/06/2019 21:00    ____________________________________________   PROCEDURES  Procedure(s) performed (including Critical Care):  .Critical Care Performed by: Vanessa [place], MD Authorized by: Vanessa [place], MD   Critical care provider statement:    Critical care time (minutes):  45   Critical care was necessary to treat or prevent imminent or life-threatening deterioration of the following conditions:  Sepsis   Critical care was time spent personally by me on the following activities:  Discussions with consultants, evaluation of patient's response to treatment, examination of patient, ordering and performing treatments and interventions, ordering and review of laboratory studies, ordering and review of radiographic studies, pulse oximetry, re-evaluation of patient's  condition, obtaining history from patient or surrogate and review of old charts     ____________________________________________   INITIAL IMPRESSION / ASSESSMENT AND PLAN / ED COURSE  Vincent Poole was evaluated in Emergency Department on 06/06/2019 for the symptoms described in the history of present illness. He was evaluated in the context of the global COVID-19 pandemic, which necessitated consideration that the patient might be at risk for infection with the SARS-CoV-2 virus that causes COVID-19. Institutional protocols and algorithms that pertain to the evaluation of patients at risk for COVID-19 are in a state of rapid change based on information released by regulatory bodies including the CDC and federal and state organizations. These policies and algorithms were followed during the patient's care in the ED.    Pt with multiple surgeries who presents with abd pain/vomiting.  Will get CT scan to evaluate for SBO, AAA, diverticulitis, appendicitis.  Will get labs to evaluate for dehydration.   Cr is around baseline at 1.38.  However it potassium is low at 2.2 white count is also elevated at 23.5.  Patient appears chronically elevated but given his complex history will get blood cultures and cover him with antibiotics.  CT scan is concerning for large bowel obstruction.   9:08 PM d.w surgery Dr. Celine Ahr given concerning there may be more surgeries team involved consider going to hospital team. If further emesis, then put in NG  tube.  Recommend discussing with urology.   9:20 PM D/w urology Dr. Versie Starks not operative candidate. Recommend admission to tune up electrolytes, keep NPO.   D/w Hospital team and they will admit pt.    ____________________________________________   FINAL CLINICAL IMPRESSION(S) / ED DIAGNOSES   Final diagnoses:  Large bowel obstruction ([place])  Hypokalemia      MEDICATIONS GIVEN DURING THIS VISIT:  Medications - No data to display   ED Discharge  Orders    None       Note:  This document was prepared using Dragon voice recognition software and may include unintentional dictation errors.   Vanessa [place], MD 06/07/19 1356    Vanessa [place], MD 06/14/19 [phone]

## 2019-06-06 NOTE — ED Notes (Signed)
Patient transported to CT at this time. 

## 2019-06-06 NOTE — ED Notes (Signed)
ED TO INPATIENT HANDOFF REPORT  ED Nurse Name and Phone #: [place] Levels Name/Age/Gender Vincent Poole 84 y.o. male Room/Bed: ED25A/ED25A  Code Status   Code Status: Prior  Home/SNF/Other Home Patient oriented to: self, place, time and situation Is this baseline? Yes   Triage Complete: Triage complete  Chief Complaint Vomiting/cough/weak  Triage Note Patient c/o abdominal pain, emesis, constipation, weakness. Patient is a cancer patient with weekly treatments in the cancer center (unsure if chemo).    Allergies No Known Allergies  Level of Care/Admitting Diagnosis ED Disposition    ED Disposition Condition Comment   Admit  Hospital Area: [facility] [[id number]]  Level of Care: Med-Surg [16]  Covid Evaluation: Confirmed COVID Negative  Diagnosis: Large bowel obstruction [facility]) [[id number]]  Admitting Physician: Mayer Camel [[id number]]  Attending Physician: Mayer Camel [[id number]]  Estimated length of stay: past midnight tomorrow  Certification:: I certify this patient will need inpatient services for at least 2 midnights  PT Class (Do Not Modify): Inpatient [101]  PT Acc Code (Do Not Modify): Private [1]       B Medical/Surgery History Past Medical History:  Diagnosis Date  . Anxiety   . Arthritis   . Bladder cancer ([place])   . Dysrhythmia 07/2018   history of atrial flutter that worsens with anxiety  . Femur fracture, right ([place]) 05/01/2018  . GERD (gastroesophageal reflux disease)   . History of recent blood transfusion 05/2018  . Hypertension   . Iron deficiency anemia 09/14/2018  . Prostate cancer ([place]) 07/2018   cancer growing in prostate but not prostate cancer, it is from the bladder  . Umbilical hernia 43/3295  . Urinary retention 2019   foley catheter place 11/2017  . UTI (urinary tract infection) 2019   frequent UTI's over last year  . Wound eschar of foot 07/2018   left heal getting wrapped and requiring antibiotic cream. cracks  open with weight bearing.   Past Surgical History:  Procedure Laterality Date  . CARPAL TUNNEL RELEASE Right   . CHOLECYSTECTOMY  2004  . CYSTOGRAM  07/18/2018   Procedure: CYSTOGRAM;  Surgeon: [name], MD;  Location: [facility];  Service: Urology;;  . Consuela Mimes W/ RETROGRADES Bilateral 07/18/2018   Procedure: CYSTOSCOPY WITH RETROGRADE PYELOGRAM;  Surgeon: [name], MD;  Location: [facility];  Service: Urology;  Laterality: Bilateral;  . CYSTOSCOPY W/ URETERAL STENT PLACEMENT Bilateral 12/27/2017   Procedure: CYSTOSCOPY WITH RETROGRADE PYELOGRAM/URETERAL STENT PLACEMENT;  Surgeon: [name], MD;  Location: [facility];  Service: Urology;  Laterality: Bilateral;  . CYSTOSCOPY W/ URETERAL STENT PLACEMENT Bilateral 07/18/2018   Procedure: CYSTOSCOPY WITH STENT REPLACEMENT (exchange);  Surgeon: [name], MD;  Location: [facility];  Service: Urology;  Laterality: Bilateral;  . CYSTOSCOPY WITH STENT PLACEMENT Bilateral 11/12/2018   Procedure: [place] WITH STENT Exchange;  Surgeon: [name], MD;  Location: [facility];  Service: Urology;  Laterality: Bilateral;  . INTRAMEDULLARY (IM) NAIL INTERTROCHANTERIC Right 05/02/2018   Procedure: INTRAMEDULLARY (IM) NAIL INTERTROCHANTRIC;  Surgeon: [name], MD;  Location: [facility];  Service: Orthopedics;  Laterality: Right;  . IR NEPHROSTOMY EXCHANGE LEFT  03/25/2019  . IR NEPHROSTOMY EXCHANGE LEFT  04/19/2019  . IR NEPHROSTOMY EXCHANGE LEFT  05/31/2019  . IR NEPHROSTOMY EXCHANGE RIGHT  03/25/2019  . IR NEPHROSTOMY EXCHANGE RIGHT  04/19/2019  . IR NEPHROSTOMY EXCHANGE RIGHT  05/31/2019  . IR NEPHROSTOMY PLACEMENT LEFT  02/23/2019  . IR NEPHROSTOMY PLACEMENT RIGHT  02/23/2019  . LEG  TENDON SURGERY Right 1958  . PORTA CATH INSERTION N/A 01/22/2018   Procedure: PORTA CATH INSERTION;  Surgeon: [name], MD;  Location: [facility];  Service: Cardiovascular;  Laterality: N/A;  . TRANSURETHRAL RESECTION OF BLADDER TUMOR N/A  12/27/2017   Procedure: TRANSURETHRAL RESECTION OF BLADDER TUMOR (TURBT);  Surgeon: [name], MD;  Location: [facility];  Service: Urology;  Laterality: N/A;  . TRANSURETHRAL RESECTION OF BLADDER TUMOR N/A 01/15/2018   Procedure: TRANSURETHRAL RESECTION OF BLADDER TUMOR (TURBT);  Surgeon: [name], MD;  Location: [facility];  Service: Urology;  Laterality: N/A;  Need 2 hrs for this case please  . TRANSURETHRAL RESECTION OF BLADDER TUMOR N/A 07/18/2018   Procedure: TRANSURETHRAL RESECTION OF BLADDER TUMOR (TURBT);  Surgeon: [name], MD;  Location: [facility];  Service: Urology;  Laterality: N/A;     A IV Location/Drains/Wounds Patient Lines/Drains/Airways Status   Active Line/Drains/Airways    Name:   Placement date:   Placement time:   Site:   Days:   Implanted Port 03/21/19 Right Chest   03/21/19    0916    Chest   77   Peripheral IV 06/06/19 Left Forearm   06/06/19    2016    Forearm   less than 1   Peripheral IV 06/06/19 Right Antecubital   06/06/19    2151    Antecubital   less than 1   Nephrostomy Right 12 Fr.   04/19/19    0939    Right   48   Nephrostomy Left 12 Fr.   04/19/19    0939    Left   48   Pressure Injury 04/26/19 Stage II -  Partial thickness loss of dermis presenting as a shallow open ulcer with a red, pink wound bed without slough. small open area, red blanchable area in mid/lower buttocks    04/26/19    0452     41          Intake/Output Last 24 hours No intake or output data in the 24 hours ending 06/06/19 2350  Labs/Imaging Results for orders placed or performed during the hospital encounter of 06/06/19 (from the past 48 hour(s))  Troponin I (High Sensitivity)     Status: Abnormal   Collection Time: 06/06/19  8:15 PM  Result Value Ref Range   Troponin I (High Sensitivity) 47 (H) <18 ng/L    Comment: (NOTE) Elevated high sensitivity troponin I (hsTnI) values and significant  changes across serial measurements may suggest ACS but many other   chronic and acute conditions are known to elevate hsTnI results.  Refer to the "Links" section for chest pain algorithms and additional  guidance. Performed at [facility]: [address] [place], [place] [address]   Lipase, blood     Status: None   Collection Time: 06/06/19  8:17 PM  Result Value Ref Range   Lipase 15 11 - 51 U/L    Comment: Performed at [facility], [address] [place], [place] [address]  Comprehensive metabolic panel     Status: Abnormal   Collection Time: 06/06/19  8:17 PM  Result Value Ref Range   Sodium 134 (L) 135 - 145 mmol/L   Potassium 2.2 (LL) 3.5 - 5.1 mmol/L    Comment: CRITICAL RESULT CALLED TO, READ BACK BY AND VERIFIED WITH TOM NAGGY @2052  06/06/19 MJU    Chloride 100 98 - 111 mmol/L   CO2 22 22 - 32 mmol/L   Glucose,  Bld 169 (H) 70 - 99 mg/dL   BUN 25 (H) 8 - 23 mg/dL   Creatinine, Ser 1.38 (H) 0.61 - 1.24 mg/dL   Calcium 8.4 (L) 8.9 - 10.3 mg/dL   Total Protein 6.6 6.5 - 8.1 g/dL   Albumin 2.7 (L) 3.5 - 5.0 g/dL   AST 11 (L) 15 - 41 U/L   ALT 7 0 - 44 U/L   Alkaline Phosphatase 75 38 - 126 U/L   Total Bilirubin 0.7 0.3 - 1.2 mg/dL   GFR calc non Af Amer 47 (L) >60 mL/min   GFR calc Af Amer 55 (L) >60 mL/min   Anion gap 12 5 - 15    Comment: Performed at [facility], [address] [place], [place] [address]  CBC     Status: Abnormal   Collection Time: 06/06/19  8:17 PM  Result Value Ref Range   WBC 23.5 (H) 4.0 - 10.5 K/uL   RBC 3.39 (L) 4.22 - 5.81 MIL/uL   Hemoglobin 9.7 (L) 13.0 - 17.0 g/dL   HCT 30.1 (L) 39.0 - 52.0 %   MCV 88.8 80.0 - 100.0 fL   MCH 28.6 26.0 - 34.0 pg   MCHC 32.2 30.0 - 36.0 g/dL   RDW 14.9 11.5 - 15.5 %   Platelets 547 (H) 150 - 400 K/uL   nRBC 0.0 0.0 - 0.2 %    Comment: Performed at [facility], [address] [place], [place] [address]  Lactic acid, plasma     Status: None   Collection Time: 06/06/19  8:17 PM  Result Value Ref Range   Lactic Acid,  Venous 1.6 0.5 - 1.9 mmol/L    Comment: Performed at [facility], [address] [place], [place] [address]  Protime-INR     Status: Abnormal   Collection Time: 06/06/19  8:17 PM  Result Value Ref Range   Prothrombin Time 17.7 (H) 11.4 - 15.2 seconds   INR 1.5 (H) 0.8 - 1.2    Comment: (NOTE) INR goal varies based on device and disease states. Performed at [facility], [address] [place], [place] [address]   APTT     Status: Abnormal   Collection Time: 06/06/19  8:17 PM  Result Value Ref Range   aPTT 42 (H) 24 - 36 seconds    Comment:        IF BASELINE aPTT IS ELEVATED, SUGGEST PATIENT RISK ASSESSMENT BE USED TO DETERMINE APPROPRIATE ANTICOAGULANT THERAPY. Performed at [facility], [address] [place], [place] [address]   SARS Coronavirus 2 (CEPHEID - Performed in [facility] hospital lab), Hosp Order     Status: None   Collection Time: 06/06/19  9:54 PM   Specimen: Nasopharyngeal Swab  Result Value Ref Range   SARS Coronavirus 2 NEGATIVE NEGATIVE    Comment: (NOTE) If result is NEGATIVE SARS-CoV-2 target nucleic acids are NOT DETECTED. The SARS-CoV-2 RNA is generally detectable in upper and lower  respiratory specimens during the acute phase of infection. The lowest  concentration of SARS-CoV-2 viral copies this assay can detect is 250  copies / mL. A negative result does not preclude SARS-CoV-2 infection  and should not be used as the sole basis for treatment or other  patient management decisions.  A negative result may occur with  improper specimen collection / handling, submission of specimen other  than nasopharyngeal swab, presence of viral mutation(s) within the  areas targeted by this assay, and inadequate  number of viral copies  (<250 copies / mL). A negative result must be combined with clinical  observations, patient history, and epidemiological information. If result is POSITIVE SARS-CoV-2 target nucleic acids are  DETECTED. The SARS-CoV-2 RNA is generally detectable in upper and lower  respiratory specimens dur ing the acute phase of infection.  Positive  results are indicative of active infection with SARS-CoV-2.  Clinical  correlation with patient history and other diagnostic information is  necessary to determine patient infection status.  Positive results do  not rule out bacterial infection or co-infection with other viruses. If result is PRESUMPTIVE POSTIVE SARS-CoV-2 nucleic acids MAY BE PRESENT.   A presumptive positive result was obtained on the submitted specimen  and confirmed on repeat testing.  While 2019 novel coronavirus  (SARS-CoV-2) nucleic acids may be present in the submitted sample  additional confirmatory testing may be necessary for epidemiological  and / or clinical management purposes  to differentiate between  SARS-CoV-2 and other Sarbecovirus currently known to infect humans.  If clinically indicated additional testing with an alternate test  methodology [id number]) is advised. The SARS-CoV-2 RNA is generally  detectable in upper and lower respiratory sp ecimens during the acute  phase of infection. The expected result is Negative. Fact Sheet for Patients:  StrictlyIdeas.no Fact Sheet for Healthcare Providers: BankingDealers.co.za This test is not yet approved or cleared by the [place] FDA and has been authorized for detection and/or diagnosis of SARS-CoV-2 by FDA under an Emergency Use Authorization (EUA).  This EUA will remain in effect (meaning this test can be used) for the duration of the COVID-19 declaration under Section 564(b)(1) of the Act, 21 U.S.C. section 360bbb-3(b)(1), unless the authorization is terminated or revoked sooner. Performed at [facility], [address] [place], [place] [address]   Urinalysis, Complete w Microscopic     Status: Abnormal   Collection Time: 06/06/19 11:20 PM   Result Value Ref Range   Color, Urine YELLOW (A) YELLOW   APPearance CLOUDY (A) CLEAR   Specific Gravity, Urine 1.045 (H) 1.005 - 1.030   pH 5.0 5.0 - 8.0   Glucose, UA NEGATIVE NEGATIVE mg/dL   Hgb urine dipstick SMALL (A) NEGATIVE   Bilirubin Urine NEGATIVE NEGATIVE   Ketones, ur NEGATIVE NEGATIVE mg/dL   Protein, ur 100 (A) NEGATIVE mg/dL   Nitrite NEGATIVE NEGATIVE   Leukocytes,Ua LARGE (A) NEGATIVE   RBC / HPF >50 (H) 0 - 5 RBC/hpf   WBC, UA >50 (H) 0 - 5 WBC/hpf   Bacteria, UA NONE SEEN NONE SEEN   Squamous Epithelial / LPF NONE SEEN 0 - 5   WBC Clumps PRESENT     Comment: Performed at [facility], [address] [place], [place] [address]   Ct Abdomen Pelvis W Contrast  Result Date: 06/06/2019 CLINICAL DATA:  Patient c/o abdominal pain, emesis, constipation, weakness. Patient is a cancer patient with weekly treatments in the cancer center (unsure if chemo). Patient has bilateral nephrostomy tubes. EXAM: CT ABDOMEN AND PELVIS WITH CONTRAST TECHNIQUE: Multidetector CT imaging of the abdomen and pelvis was performed using the standard protocol following bolus administration of intravenous contrast. CONTRAST:  166mL OMNIPAQUE IOHEXOL 300 MG/ML  SOLN COMPARISON:  CT of the abdomen and pelvis on 04/16/2019, 02/21/2019 FINDINGS: Lower chest: Extensive coronary artery calcification. Pericardial thickening or small effusion. Small hiatal hernia. Hepatobiliary: Cholecystectomy. Liver is otherwise unremarkable. Pancreas: Atrophic pancreas normal in appearance. Spleen: Normal in size without focal abnormality. Adrenals/Urinary Tract: Adrenal glands  are normal. Bilateral percutaneous nephrostomy catheters. Stable RIGHT renal cyst. Ureters are decompressed. Irregular soft tissue thickening of the urinary bladder wall, associated with surrounding inflammatory changes. There is a broad interface with the anterior abdominal wall, suspicious for involvement of the anterior abdominal wall.  Stomach/Bowel: Hiatal hernia. Stomach is unremarkable. Small bowel loops are not dilated. The appendix is distended and contains appendicoliths. Appendix measures 13 millimeters in thickness. There is new, significant dilatation of the colon. Cecum measures 9.0 centimeters. The colon is dilated and stool filled throughout its course to level of the descending colon where there is a beak adjacent to the bladder mass, best seen on image 32/5. This abrupt transition zone is consistent with malignant adhesion or mass effects and causing significant obstruction. A second possible narrowing is also identified within the sigmoid colon best seen on coronal image 19/5. There is no evidence for free intraperitoneal air. Vascular/Lymphatic: Dense atherosclerotic calcifications of the abdominal aorta. No aneurysm. Reproductive: Prostatic calcifications. Previous TURP. Other: No ascites. Small amount of fluid within the RIGHT inguinal canal. Musculoskeletal: Degenerative changes are seen spine and hips, RIGHT greater than LEFT. Previous ORIF of the RIGHT hip. Significant degenerative changes and postoperative IMPRESSION: 1. Large bowel obstruction likely related to bladder tumor. Sharp transition zone in the LOWER descending colon is consistent with malignant adhesion, direct invasion by the tumor, or mass effect from the tumor. Cecum measures up to 9 centimeters. 2. Suspect involvement of the anterior abdominal wall by bladder tumor. 3. No evidence for free intraperitoneal air. 4. Bilateral percutaneous nephrostomy catheters. 5. Hiatal hernia. 6. Coronary artery disease. 7. Previous TURP. 8. Aortic Atherosclerosis (ICD10-I70.0). These results were called by telephone at the time of interpretation on 06/06/2019 at 8:58 pm to Dr. Marjean Donna , who verbally acknowledged these results. Electronically Signed   By: Nolon Nations M.D.   On: 06/06/2019 21:00    Pending Labs Unresulted Labs (From admission, onward)    Start      Ordered   06/06/19 2102  Blood culture (routine x 2)  BLOOD CULTURE X 2,   STAT     06/06/19 2102   Signed and Held  CBC  (enoxaparin (LOVENOX)    CrCl >/= 30 ml/min)  Once,   R    Comments: Baseline for enoxaparin therapy IF NOT ALREADY DRAWN.  Notify MD if PLT < 100 K.    Signed and Held   Signed and Held  Creatinine, serum  (enoxaparin (LOVENOX)    CrCl >/= 30 ml/min)  Once,   R    Comments: Baseline for enoxaparin therapy IF NOT ALREADY DRAWN.    Signed and Held   Signed and Held  Creatinine, serum  (enoxaparin (LOVENOX)    CrCl >/= 30 ml/min)  Weekly,   R    Comments: while on enoxaparin therapy    Signed and Held   Signed and Held  Basic metabolic panel  Tomorrow morning,   R     Signed and Held   Signed and Held  CBC  Tomorrow morning,   R     Signed and Held          Vitals/Pain Today's Vitals   06/06/19 2230 06/06/19 2245 06/06/19 2300 06/06/19 2330  BP: 115/61  (!) 112/59 115/62  Pulse: 84 84 80 84  Resp: (!) 22 18 19 18   Temp:      SpO2: 99% 98% 98% 99%  Weight:      Height:  PainSc:        Isolation Precautions No active isolations  Medications Medications  potassium chloride 10 mEq in 100 mL IVPB (10 mEq Intravenous New Bag/Given 06/06/19 2330)  ondansetron (ZOFRAN) injection 4 mg (4 mg Intravenous Given 06/06/19 2022)  sodium chloride 0.9 % bolus 1,000 mL (0 mLs Intravenous Stopped 06/06/19 2308)  iohexol (OMNIPAQUE) 300 MG/ML solution 100 mL (100 mLs Intravenous Contrast Given 06/06/19 2026)  piperacillin-tazobactam (ZOSYN) IVPB 3.375 g (0 g Intravenous Stopped 06/06/19 2308)    Mobility non-ambulatory High fall risk   Focused Assessments Cardiac Assessment Handoff:    Lab Results  Component Value Date   TROPONINI 0.20 ([place]) 07/11/2016   No results found for: DDIMER Does the Patient currently have chest pain? No     R Recommendations: See Admitting Provider Note  Report given to:   Additional Notes:

## 2019-06-06 NOTE — ED Notes (Signed)
Urine collected from L nephrostomy tube

## 2019-06-06 NOTE — ED Triage Notes (Signed)
Patient c/o abdominal pain, emesis, constipation, weakness. Patient is a cancer patient with weekly treatments in the cancer center (unsure if chemo).

## 2019-06-06 NOTE — H&P (Addendum)
[facility] [place] NAME: Vincent Poole    MR#:  [id number]  DATE OF BIRTH:  December 20, 1934  DATE OF ADMISSION:  06/06/2019  PRIMARY CARE PHYSICIAN: [name], MD   REQUESTING/REFERRING PHYSICIAN: Marjean Donna, MD  CHIEF COMPLAINT:   Chief Complaint  Patient presents with  . Emesis  . Abdominal Pain  . Constipation    HISTORY OF PRESENT ILLNESS:  Vincent Poole  is a 84 y.o. male with a known history of bladder cancer currently on immunotherapy, dysrhythmia, GERD, hypertension, frequent urinary tract infections.  He presented to the emergency room accompanied by his wife reporting a 6-day history of intermittent abdominal pain and nausea vomiting.  No observed exacerbating or alleviating factors.  Patient reports he continues to be nauseous and therefore finds it difficult to eat.  His last bowel movement was 5 days ago.  He has noted no hematemesis, hematochezia, or melena.  He denies chest pain or increase shortness of breath.  He denies fevers or chills.  He denies diarrhea.  He has a history of bladder cancer and is currently on immunotherapy.  Patient is followed by Dr. Erlene Quan with Urology as well.  He has had recent admissions on 6/22 due to sepsis secondary to recurrent urinary tract infection with chronic bilateral nephrostomy tubes and chronic Foley catheter.  UTI was positive for Pseudomonas.  Urinalysis today demonstrates large amount leukocytes and greater than 50 WBCs.  Rapid COVID-19 testing is negative.  Lactic acid is 1.6.  Potassium is 2.2.  CT abdomen demonstrates large bowel obstruction likely related to bladder tumor with sharp transition zone in the lower descending colon consistent with malignant adhesion.  There is direct invasion by the tumor.  The cecum measures up to 9 cm.  There is also suspicion for involvement of the anterior abdominal wall by bladder tumor.  There is no evidence of free intraperitoneal air.   He has been admitted to the hospitalist service.  Dr. Diamantina Providence and Dr. Celine Ahr were consulted by the ED physician and agree patient is likely not an operative candidate.  Suggestions to keep patient n.p.o. with NG tube if needed.  PAST MEDICAL HISTORY:   Past Medical History:  Diagnosis Date  . Anxiety   . Arthritis   . Bladder cancer ([place])   . Dysrhythmia 07/2018   history of atrial flutter that worsens with anxiety  . Femur fracture, right ([place]) 05/01/2018  . GERD (gastroesophageal reflux disease)   . History of recent blood transfusion 05/2018  . Hypertension   . Iron deficiency anemia 09/14/2018  . Prostate cancer ([place]) 07/2018   cancer growing in prostate but not prostate cancer, it is from the bladder  . Umbilical hernia 63/0160  . Urinary retention 2019   foley catheter place 11/2017  . UTI (urinary tract infection) 2019   frequent UTI's over last year  . Wound eschar of foot 07/2018   left heal getting wrapped and requiring antibiotic cream. cracks open with weight bearing.    PAST SURGICAL HISTORY:   Past Surgical History:  Procedure Laterality Date  . CARPAL TUNNEL RELEASE Right   . CHOLECYSTECTOMY  2004  . CYSTOGRAM  07/18/2018   Procedure: CYSTOGRAM;  Surgeon: [name], MD;  Location: [facility];  Service: Urology;;  . Consuela Mimes W/ RETROGRADES Bilateral 07/18/2018   Procedure: CYSTOSCOPY WITH RETROGRADE PYELOGRAM;  Surgeon: [name], MD;  Location: [facility];  Service: Urology;  Laterality: Bilateral;  . CYSTOSCOPY W/ URETERAL  STENT PLACEMENT Bilateral 12/27/2017   Procedure: CYSTOSCOPY WITH RETROGRADE PYELOGRAM/URETERAL STENT PLACEMENT;  Surgeon: [name], MD;  Location: [facility];  Service: Urology;  Laterality: Bilateral;  . CYSTOSCOPY W/ URETERAL STENT PLACEMENT Bilateral 07/18/2018   Procedure: CYSTOSCOPY WITH STENT REPLACEMENT (exchange);  Surgeon: [name], MD;  Location: [facility];  Service: Urology;  Laterality: Bilateral;  . CYSTOSCOPY  WITH STENT PLACEMENT Bilateral 11/12/2018   Procedure: [place] WITH STENT Exchange;  Surgeon: [name], MD;  Location: [facility];  Service: Urology;  Laterality: Bilateral;  . INTRAMEDULLARY (IM) NAIL INTERTROCHANTERIC Right 05/02/2018   Procedure: INTRAMEDULLARY (IM) NAIL INTERTROCHANTRIC;  Surgeon: [name], MD;  Location: [facility];  Service: Orthopedics;  Laterality: Right;  . IR NEPHROSTOMY EXCHANGE LEFT  03/25/2019  . IR NEPHROSTOMY EXCHANGE LEFT  04/19/2019  . IR NEPHROSTOMY EXCHANGE LEFT  05/31/2019  . IR NEPHROSTOMY EXCHANGE RIGHT  03/25/2019  . IR NEPHROSTOMY EXCHANGE RIGHT  04/19/2019  . IR NEPHROSTOMY EXCHANGE RIGHT  05/31/2019  . IR NEPHROSTOMY PLACEMENT LEFT  02/23/2019  . IR NEPHROSTOMY PLACEMENT RIGHT  02/23/2019  . LEG TENDON SURGERY Right 1958  . PORTA CATH INSERTION N/A 01/22/2018   Procedure: PORTA CATH INSERTION;  Surgeon: [name], MD;  Location: [facility];  Service: Cardiovascular;  Laterality: N/A;  . TRANSURETHRAL RESECTION OF BLADDER TUMOR N/A 12/27/2017   Procedure: TRANSURETHRAL RESECTION OF BLADDER TUMOR (TURBT);  Surgeon: [name], MD;  Location: [facility];  Service: Urology;  Laterality: N/A;  . TRANSURETHRAL RESECTION OF BLADDER TUMOR N/A 01/15/2018   Procedure: TRANSURETHRAL RESECTION OF BLADDER TUMOR (TURBT);  Surgeon: [name], MD;  Location: [facility];  Service: Urology;  Laterality: N/A;  Need 2 hrs for this case please  . TRANSURETHRAL RESECTION OF BLADDER TUMOR N/A 07/18/2018   Procedure: TRANSURETHRAL RESECTION OF BLADDER TUMOR (TURBT);  Surgeon: [name], MD;  Location: [facility];  Service: Urology;  Laterality: N/A;    SOCIAL HISTORY:   Social History   Tobacco Use  . Smoking status: Never Smoker  . Smokeless tobacco: Never Used  Substance Use Topics  . Alcohol use: No    Alcohol/week: 0.0 standard drinks    FAMILY HISTORY:   Family History  Problem Relation Age of Onset  . Cancer Mother   . Chronic  Renal Failure Mother   . Heart disease Father     DRUG ALLERGIES:  No Known Allergies  REVIEW OF SYSTEMS:   Review of Systems  Constitutional: Positive for malaise/fatigue. Negative for chills and fever.  HENT: Negative for congestion, sinus pain and sore throat.   Eyes: Negative for blurred vision and double vision.  Respiratory: Negative for cough, shortness of breath and wheezing.   Cardiovascular: Negative for chest pain and leg swelling.  Gastrointestinal: Positive for abdominal pain (off and on for 6 days), constipation, nausea (Off and on for 6 days) and vomiting. Negative for blood in stool, diarrhea, heartburn and melena.  Genitourinary: Negative for dysuria, flank pain and hematuria.  Musculoskeletal: Negative for falls.  Skin: Negative.  Negative for itching and rash.  Neurological: Positive for weakness. Negative for dizziness.  Psychiatric/Behavioral: Negative.  Negative for depression.    MEDICATIONS AT HOME:   Prior to Admission medications   Medication Sig Start Date End Date Taking? Authorizing Provider  acetaminophen (TYLENOL) 500 MG tablet Take 1,000 mg by mouth every 6 (six) hours as needed for moderate pain or headache.    Yes [provider]  cetirizine (ZYRTEC) 10 MG  tablet Take 10 mg by mouth daily as needed for allergies.    Yes [provider]  Cranberry-Cholecalciferol (SUPER CRANBERRY/VITAMIN D3) 4200-500 MG-UNIT CAPS Take 1 capsule by mouth 2 (two) times daily.   Yes [provider]  ELIQUIS 5 MG TABS tablet TAKE ONE TABLET BY MOUTH TWICE DAILY 06/03/19  Yes Sindy Guadeloupe, MD  ferrous sulfate 325 (65 FE) MG tablet Take 1 tablet (325 mg total) by mouth 2 (two) times daily with a meal. 05/06/18  Yes Gouru, Aruna, MD  metoprolol tartrate (LOPRESSOR) 25 MG tablet Take 1 tablet (25 mg total) by mouth 2 (two) times daily. 04/29/19  Yes Fritzi Mandes, MD  Multiple Vitamin (MULTIVITAMIN WITH MINERALS) TABS tablet Take 1 tablet by mouth  daily.   Yes [provider]  MYRBETRIQ 50 MG TB24 tablet TAKE 1 TABLET BY MOUTH DAILY 04/30/19  Yes McGowan, Larene Beach A, PA-C  nystatin [facility]) powder Apply topically 2 (two) times daily. 09/28/18  Yes McGowan, Larene Beach A, PA-C  oxyCODONE (OXY IR/ROXICODONE) 5 MG immediate release tablet Take 1 tablet (5 mg total) by mouth every 4 (four) hours as needed for severe pain. 03/21/19  Yes Sindy Guadeloupe, MD  polyethylene glycol powder (MIRALAX) powder Take 17 g by mouth daily as needed. Can increase to 3 times a day as needed for constipation but hold medication if has diarrhea 01/30/18  Yes Sindy Guadeloupe, MD  vitamin B-12 (CYANOCOBALAMIN) 500 MCG tablet Take 500 mcg by mouth daily.   Yes [provider]  zinc oxide 20 % ointment Apply 1 application topically as needed for irritation.   Yes [provider]  lidocaine-prilocaine (EMLA) cream Apply 1 application topically as needed. 01/02/19   Sindy Guadeloupe, MD      VITAL SIGNS:  Blood pressure (!) 112/59, pulse 80, temperature 98.2 F (36.8 C), resp. rate 19, height 5\' 9"  (1.753 m), weight 69.9 kg, SpO2 98 %.  PHYSICAL EXAMINATION:  Physical Exam  GENERAL:  84 y.o.-year-old weak appearing patient lying in the bed with no acute distress.  EYES: Pupils equal, round, reactive to light and accommodation. No scleral icterus. Extraocular muscles intact.  HEENT: Head atraumatic, normocephalic. Oropharynx and nasopharynx clear.  NECK:  Supple, no jugular venous distention. No thyroid enlargement, no tenderness.  LUNGS: Normal breath sounds bilaterally, no wheezing, rales,rhonchi or crepitation. No use of accessory muscles of respiration.  CARDIOVASCULAR: Regular rate and rhythm, S1, S2 normal. No murmurs, rubs, or gallops.  ABDOMEN: Firm, distended, and tender. Bowel sounds absent no organomegaly or mass.  Bilateral nephrostomy tubes draining clear yellow urine EXTREMITIES: No pedal edema, cyanosis, or clubbing.  NEUROLOGIC:  Cranial nerves II through XII are intact. Muscle strength 5/5 in all extremities. Sensation intact. Gait not checked.  PSYCHIATRIC: The patient is alert and oriented x 3.  Normal affect and good eye contact. SKIN: No obvious rash, lesion, or ulcer.   LABORATORY PANEL:   CBC Recent Labs  Lab 06/06/19 2017  WBC 23.5*  HGB 9.7*  HCT 30.1*  PLT 547*   ------------------------------------------------------------------------------------------------------------------  Chemistries  Recent Labs  Lab 06/06/19 2017  NA 134*  K 2.2*  CL 100  CO2 22  GLUCOSE 169*  BUN 25*  CREATININE 1.38*  CALCIUM 8.4*  AST 11*  ALT 7  ALKPHOS 75  BILITOT 0.7   ------------------------------------------------------------------------------------------------------------------  Cardiac Enzymes No results for input(s): TROPONINI in the last 168 hours. ------------------------------------------------------------------------------------------------------------------  RADIOLOGY:  Ct Abdomen Pelvis W Contrast  Result Date: 06/06/2019 CLINICAL  DATA:  Patient c/o abdominal pain, emesis, constipation, weakness. Patient is a cancer patient with weekly treatments in the cancer center (unsure if chemo). Patient has bilateral nephrostomy tubes. EXAM: CT ABDOMEN AND PELVIS WITH CONTRAST TECHNIQUE: Multidetector CT imaging of the abdomen and pelvis was performed using the standard protocol following bolus administration of intravenous contrast. CONTRAST:  140mL OMNIPAQUE IOHEXOL 300 MG/ML  SOLN COMPARISON:  CT of the abdomen and pelvis on 04/16/2019, 02/21/2019 FINDINGS: Lower chest: Extensive coronary artery calcification. Pericardial thickening or small effusion. Small hiatal hernia. Hepatobiliary: Cholecystectomy. Liver is otherwise unremarkable. Pancreas: Atrophic pancreas normal in appearance. Spleen: Normal in size without focal abnormality. Adrenals/Urinary Tract: Adrenal glands are normal. Bilateral  percutaneous nephrostomy catheters. Stable RIGHT renal cyst. Ureters are decompressed. Irregular soft tissue thickening of the urinary bladder wall, associated with surrounding inflammatory changes. There is a broad interface with the anterior abdominal wall, suspicious for involvement of the anterior abdominal wall. Stomach/Bowel: Hiatal hernia. Stomach is unremarkable. Small bowel loops are not dilated. The appendix is distended and contains appendicoliths. Appendix measures 13 millimeters in thickness. There is new, significant dilatation of the colon. Cecum measures 9.0 centimeters. The colon is dilated and stool filled throughout its course to level of the descending colon where there is a beak adjacent to the bladder mass, best seen on image 32/5. This abrupt transition zone is consistent with malignant adhesion or mass effects and causing significant obstruction. A second possible narrowing is also identified within the sigmoid colon best seen on coronal image 19/5. There is no evidence for free intraperitoneal air. Vascular/Lymphatic: Dense atherosclerotic calcifications of the abdominal aorta. No aneurysm. Reproductive: Prostatic calcifications. Previous TURP. Other: No ascites. Small amount of fluid within the RIGHT inguinal canal. Musculoskeletal: Degenerative changes are seen spine and hips, RIGHT greater than LEFT. Previous ORIF of the RIGHT hip. Significant degenerative changes and postoperative IMPRESSION: 1. Large bowel obstruction likely related to bladder tumor. Sharp transition zone in the LOWER descending colon is consistent with malignant adhesion, direct invasion by the tumor, or mass effect from the tumor. Cecum measures up to 9 centimeters. 2. Suspect involvement of the anterior abdominal wall by bladder tumor. 3. No evidence for free intraperitoneal air. 4. Bilateral percutaneous nephrostomy catheters. 5. Hiatal hernia. 6. Coronary artery disease. 7. Previous TURP. 8. Aortic Atherosclerosis  (ICD10-I70.0). These results were called by telephone at the time of interpretation on 06/06/2019 at 8:58 pm to Dr. Marjean Donna , who verbally acknowledged these results. Electronically Signed   By: Nolon Nations M.D.   On: 06/06/2019 21:00      IMPRESSION AND PLAN:   1.  Large bowel obstruction - Dr. Celine Ahr with general surgery consulted as well as urology given the nature of the obstruction likely related to bladder cancer with both finding patient to be an unlikely candidate for surgery.  However patient has been kept n.p.o. per recommendations. - NG tube to low wall suction - We will treat with IV antiemetic as needed for nausea vomiting.   2.  Bladder cancer -Currently on immunotherapy - Dr. Diamantina Providence consulted for recommendations based on abnormal findings on CT abdomen with large bowel obstruction - Bilateral nephrostomy tubes draining clear yellow urine  3.  Abdominal pain - We will treat with analgesic  4.  Hypokalemia - Telemetry monitoring -Patient is receiving IV potassium replacement -We will repeat BMP with potassium level in the a.m. and continue to treat as necessary  5.  Generalized weakness - We will consult physical therapy for  supportive care  6.  UTI - With history of Pseudomonas - We will initiate treatment with IV Zosyn and follow-up urine culture results  DVT and PPI prophylaxis    All the records are reviewed and case discussed with ED provider. The plan of care was discussed in details with the patient (and family). I answered all questions. The patient agreed to proceed with the above mentioned plan. Further management will depend upon hospital course.   CODE STATUS: Full code  TOTAL TIME TAKING CARE OF THIS PATIENT: 45 minutes.    Theo Dills Preeya Cleckley CRNPon 06/06/2019 at 11:21 PM  Pager - [phone]  After 6pm go to [email or web address] - Proofreader  [facility]  Office  [phone]  CC: Primary care  physician; [name], MD   Note: This dictation was prepared with Dragon dictation along with smaller phrase technology. Any transcriptional errors that result from this process are unintentional.

## 2019-06-07 ENCOUNTER — Other Ambulatory Visit: Payer: Self-pay

## 2019-06-07 DIAGNOSIS — K56609 Unspecified intestinal obstruction, unspecified as to partial versus complete obstruction: Principal | ICD-10-CM

## 2019-06-07 DIAGNOSIS — R109 Unspecified abdominal pain: Secondary | ICD-10-CM | POA: Diagnosis not present

## 2019-06-07 DIAGNOSIS — C689 Malignant neoplasm of urinary organ, unspecified: Secondary | ICD-10-CM

## 2019-06-07 DIAGNOSIS — Z515 Encounter for palliative care: Secondary | ICD-10-CM

## 2019-06-07 DIAGNOSIS — C679 Malignant neoplasm of bladder, unspecified: Secondary | ICD-10-CM

## 2019-06-07 DIAGNOSIS — Z7189 Other specified counseling: Secondary | ICD-10-CM

## 2019-06-07 DIAGNOSIS — R531 Weakness: Secondary | ICD-10-CM

## 2019-06-07 DIAGNOSIS — E876 Hypokalemia: Secondary | ICD-10-CM

## 2019-06-07 LAB — BASIC METABOLIC PANEL
Anion gap: 8 (ref 5–15)
Anion gap: 9 (ref 5–15)
BUN: 20 mg/dL (ref 8–23)
BUN: 22 mg/dL (ref 8–23)
CO2: 24 mmol/L (ref 22–32)
CO2: 25 mmol/L (ref 22–32)
Calcium: 8.2 mg/dL — ABNORMAL LOW (ref 8.9–10.3)
Calcium: 8.3 mg/dL — ABNORMAL LOW (ref 8.9–10.3)
Chloride: 104 mmol/L (ref 98–111)
Chloride: 105 mmol/L (ref 98–111)
Creatinine, Ser: 1.16 mg/dL (ref 0.61–1.24)
Creatinine, Ser: 1.24 mg/dL (ref 0.61–1.24)
GFR calc Af Amer: >60 mL/min (ref >60)
GFR calc Af Amer: >60 mL/min (ref >60)
GFR calc non Af Amer: 54 mL/min — ABNORMAL LOW (ref >60)
GFR calc non Af Amer: 58 mL/min — ABNORMAL LOW (ref >60)
Glucose, Bld: 111 mg/dL — ABNORMAL HIGH (ref 70–99)
Glucose, Bld: 88 mg/dL (ref 70–99)
Potassium: 2.9 mmol/L — ABNORMAL LOW (ref 3.5–5.1)
Potassium: 3 mmol/L — ABNORMAL LOW (ref 3.5–5.1)
Sodium: 137 mmol/L (ref 135–145)
Sodium: 138 mmol/L (ref 135–145)

## 2019-06-07 LAB — CBC
HCT: 29 % — ABNORMAL LOW (ref 39.0–52.0)
Hemoglobin: 9.1 g/dL — ABNORMAL LOW (ref 13.0–17.0)
MCH: 28.4 pg (ref 26.0–34.0)
MCHC: 31.4 g/dL (ref 30.0–36.0)
MCV: 90.6 fL (ref 80.0–100.0)
Platelets: 410 10*3/uL — ABNORMAL HIGH (ref 150–400)
RBC: 3.2 MIL/uL — ABNORMAL LOW (ref 4.22–5.81)
RDW: 15 % (ref 11.5–15.5)
WBC: 21.4 10*3/uL — ABNORMAL HIGH (ref 4.0–10.5)
nRBC: 0 % (ref 0.0–0.2)

## 2019-06-07 MED ORDER — BUPIVACAINE HCL (PF) 0.5 % IJ SOLN
INTRAMUSCULAR | Status: AC
  Filled 2019-06-07: qty 30

## 2019-06-07 MED ORDER — FENTANYL CITRATE (PF) 100 MCG/2ML IJ SOLN
INTRAMUSCULAR | Status: AC
  Filled 2019-06-07: qty 2

## 2019-06-07 MED ORDER — ONDANSETRON HCL 4 MG/2ML IJ SOLN: 4.0000 mg | Freq: Four times a day (QID) | INTRAMUSCULAR | Status: DC | PRN

## 2019-06-07 MED ORDER — SODIUM CHLORIDE 0.9% FLUSH
3.0000 mL | Freq: Two times a day (BID) | INTRAVENOUS | Status: DC
  Administered 2019-06-07 – 2019-06-12 (×11): 3 mL via INTRAVENOUS

## 2019-06-07 MED ORDER — ONDANSETRON HCL 4 MG PO TABS: 4.0000 mg | ORAL_TABLET | Freq: Four times a day (QID) | ORAL | Status: DC | PRN

## 2019-06-07 MED ORDER — POTASSIUM CHLORIDE 10 MEQ/100ML IV SOLN
10.0000 meq | INTRAVENOUS | Status: AC
  Administered 2019-06-07 (×4): 10 meq via INTRAVENOUS
  Filled 2019-06-07 (×2): qty 100

## 2019-06-07 MED ORDER — SODIUM CHLORIDE 0.9 % IV SOLN: 1.0000 g | INTRAVENOUS | Status: DC

## 2019-06-07 MED ORDER — HYDROMORPHONE HCL 1 MG/ML IJ SOLN
0.5000 mg | INTRAMUSCULAR | Status: DC | PRN
  Administered 2019-06-09 – 2019-06-10 (×4): 0.5 mg via INTRAVENOUS
  Filled 2019-06-07 (×4): qty 0.5

## 2019-06-07 MED ORDER — POTASSIUM CHLORIDE 10 MEQ/100ML IV SOLN
10.0000 meq | INTRAVENOUS | Status: AC
  Administered 2019-06-07 (×2): 10 meq via INTRAVENOUS
  Filled 2019-06-07 (×2): qty 100

## 2019-06-07 MED ORDER — PIPERACILLIN-TAZOBACTAM 3.375 G IVPB
3.3750 g | Freq: Three times a day (TID) | INTRAVENOUS | Status: DC
  Administered 2019-06-07 – 2019-06-09 (×8): 3.375 g via INTRAVENOUS
  Filled 2019-06-07 (×8): qty 50

## 2019-06-07 MED ORDER — ENOXAPARIN SODIUM 40 MG/0.4ML ~~LOC~~ SOLN
40.0000 mg | SUBCUTANEOUS | Status: DC
  Administered 2019-06-07 – 2019-06-11 (×5): 40 mg via SUBCUTANEOUS
  Filled 2019-06-07 (×5): qty 0.4

## 2019-06-07 MED ORDER — SODIUM CHLORIDE 0.9 % IV SOLN
INTRAVENOUS | Status: DC
  Administered 2019-06-07: 02:00:00 via INTRAVENOUS

## 2019-06-07 MED ORDER — LACTATED RINGERS IV SOLN
INTRAVENOUS | Status: DC
  Administered 2019-06-07 – 2019-06-08 (×3): via INTRAVENOUS

## 2019-06-07 MED ORDER — BUPIVACAINE LIPOSOME 1.3 % IJ SUSP
INTRAMUSCULAR | Status: AC
  Filled 2019-06-07: qty 20

## 2019-06-07 NOTE — Progress Notes (Signed)
[facility] [place] NAME: Vincent Poole    MR#:  [id number]  DATE OF BIRTH:  11-14-1934  SUBJECTIVE:  CHIEF COMPLAINT:   Chief Complaint  Patient presents with  . Emesis  . Abdominal Pain  . Constipation   Very hard of hearing.  Had to write on paper to communicate. Complains of some abdominal pain.  No vomiting.  NG tube attempted overnight and could not be placed  REVIEW OF SYSTEMS:    Review of Systems  Constitutional: Positive for malaise/fatigue. Negative for chills and fever.  HENT: Negative for sore throat.   Eyes: Negative for blurred vision, double vision and pain.  Respiratory: Negative for cough, hemoptysis, shortness of breath and wheezing.   Cardiovascular: Negative for chest pain, palpitations, orthopnea and leg swelling.  Gastrointestinal: Positive for abdominal pain. Negative for constipation, diarrhea, heartburn, nausea and vomiting.  Genitourinary: Negative for dysuria and hematuria.  Musculoskeletal: Negative for back pain and joint pain.  Skin: Negative for rash.  Neurological: Negative for sensory change, speech change, focal weakness and headaches.  Endo/Heme/Allergies: Does not bruise/bleed easily.  Psychiatric/Behavioral: Negative for depression. The patient is not nervous/anxious.     DRUG ALLERGIES:  No Known Allergies  VITALS:  Blood pressure 125/70, pulse 90, temperature (!) 97.5 F (36.4 C), temperature source Oral, resp. rate 16, height 5\' 9"  (1.753 m), weight 69.9 kg, SpO2 99 %.  PHYSICAL EXAMINATION:   Physical Exam  GENERAL:  84 y.o.-year-old patient lying in the bed with no acute distress.  EYES: Pupils equal, round, reactive to light and accommodation. No scleral icterus. Extraocular muscles intact.  HEENT: Head atraumatic, normocephalic. Oropharynx and nasopharynx clear.  NECK:  Supple, no jugular venous distention. No thyroid enlargement, no tenderness.  LUNGS: Normal breath sounds  bilaterally, no wheezing, rales, rhonchi. No use of accessory muscles of respiration.  CARDIOVASCULAR: S1, S2 normal. No murmurs, rubs, or gallops.  ABDOMEN: Soft, nontender, nondistended. Bowel sounds decreased. No organomegaly or mass.  EXTREMITIES: No cyanosis, clubbing or edema b/l.    NEUROLOGIC: Cranial nerves II through XII are intact. No focal Motor or sensory deficits b/l.   PSYCHIATRIC: The patient is alert and oriented x 3.  SKIN: No obvious rash, lesion, or ulcer.   LABORATORY PANEL:   CBC Recent Labs  Lab 06/07/19 0450  WBC 21.4*  HGB 9.1*  HCT 29.0*  PLT 410*   ------------------------------------------------------------------------------------------------------------------ Chemistries  Recent Labs  Lab 06/06/19 2017 06/07/19 0450  NA 134* 137  K 2.2* 3.0*  CL 100 104  CO2 22 25  GLUCOSE 169* 111*  BUN 25* 22  CREATININE 1.38* 1.24  CALCIUM 8.4* 8.2*  AST 11*  --   ALT 7  --   ALKPHOS 75  --   BILITOT 0.7  --    ------------------------------------------------------------------------------------------------------------------  Cardiac Enzymes No results for input(s): TROPONINI in the last 168 hours. ------------------------------------------------------------------------------------------------------------------  RADIOLOGY:  Ct Abdomen Pelvis W Contrast  Result Date: 06/06/2019 CLINICAL DATA:  Patient c/o abdominal pain, emesis, constipation, weakness. Patient is a cancer patient with weekly treatments in the cancer center (unsure if chemo). Patient has bilateral nephrostomy tubes. EXAM: CT ABDOMEN AND PELVIS WITH CONTRAST TECHNIQUE: Multidetector CT imaging of the abdomen and pelvis was performed using the standard protocol following bolus administration of intravenous contrast. CONTRAST:  169mL OMNIPAQUE IOHEXOL 300 MG/ML  SOLN COMPARISON:  CT of the abdomen and pelvis on 04/16/2019, 02/21/2019 FINDINGS: Lower chest: Extensive coronary artery  calcification. Pericardial thickening  or small effusion. Small hiatal hernia. Hepatobiliary: Cholecystectomy. Liver is otherwise unremarkable. Pancreas: Atrophic pancreas normal in appearance. Spleen: Normal in size without focal abnormality. Adrenals/Urinary Tract: Adrenal glands are normal. Bilateral percutaneous nephrostomy catheters. Stable RIGHT renal cyst. Ureters are decompressed. Irregular soft tissue thickening of the urinary bladder wall, associated with surrounding inflammatory changes. There is a broad interface with the anterior abdominal wall, suspicious for involvement of the anterior abdominal wall. Stomach/Bowel: Hiatal hernia. Stomach is unremarkable. Small bowel loops are not dilated. The appendix is distended and contains appendicoliths. Appendix measures 13 millimeters in thickness. There is new, significant dilatation of the colon. Cecum measures 9.0 centimeters. The colon is dilated and stool filled throughout its course to level of the descending colon where there is a beak adjacent to the bladder mass, best seen on image 32/5. This abrupt transition zone is consistent with malignant adhesion or mass effects and causing significant obstruction. A second possible narrowing is also identified within the sigmoid colon best seen on coronal image 19/5. There is no evidence for free intraperitoneal air. Vascular/Lymphatic: Dense atherosclerotic calcifications of the abdominal aorta. No aneurysm. Reproductive: Prostatic calcifications. Previous TURP. Other: No ascites. Small amount of fluid within the RIGHT inguinal canal. Musculoskeletal: Degenerative changes are seen spine and hips, RIGHT greater than LEFT. Previous ORIF of the RIGHT hip. Significant degenerative changes and postoperative IMPRESSION: 1. Large bowel obstruction likely related to bladder tumor. Sharp transition zone in the LOWER descending colon is consistent with malignant adhesion, direct invasion by the tumor, or mass effect from  the tumor. Cecum measures up to 9 centimeters. 2. Suspect involvement of the anterior abdominal wall by bladder tumor. 3. No evidence for free intraperitoneal air. 4. Bilateral percutaneous nephrostomy catheters. 5. Hiatal hernia. 6. Coronary artery disease. 7. Previous TURP. 8. Aortic Atherosclerosis (ICD10-I70.0). These results were called by telephone at the time of interpretation on 06/06/2019 at 8:58 pm to Dr. Marjean Donna , who verbally acknowledged these results. Electronically Signed   By: Nolon Nations M.D.   On: 06/06/2019 21:00     ASSESSMENT AND PLAN:   1.  Large bowel obstruction Due to invasion of urinary bladder cancer. Not a surgical candidate.  Poor prognosis. Discussed with surgical team and oncology.  Not a candidate for chemotherapy or surgery at this time. Palliative care consulted.  2.  Bladder cancer -Currently on immunotherapy but continues to worsen - Dr. Diamantina Providence consulted for recommendations based on abnormal findings on CT abdomen with large bowel obstruction - Bilateral nephrostomy tubes draining clear yellow urine  3.  Abdominal pain - Pain medications as needed  4.  Hypokalemia Replaced through IV.  Repeat labs.  5.  Generalized weakness -  Physical therapy consulted  6.  UTI - With history of Pseudomonas - Started on Zosyn.  All the records are reviewed and case discussed with Care Management/Social Worker Management plans discussed with the patient.  CODE STATUS: FULL CODE  DVT Prophylaxis: Lovenox  TOTAL TIME TAKING CARE OF THIS PATIENT: 35 minutes.   POSSIBLE D/C IN 1-2 DAYS, DEPENDING ON CLINICAL CONDITION.  Leia Alf Analis Distler M.D on 06/07/2019 at 12:44 PM  Between 7am to 6pm - Pager - [phone]  After 6pm go to [email or web address] - password [email or web address] [facility]  Office  [phone]  CC: Primary care physician; [name], MD  Note: This dictation was prepared with Dragon dictation along with smaller  phrase technology. Any transcriptional errors that result from this process are unintentional.

## 2019-06-07 NOTE — Progress Notes (Signed)
Brief Progress Note Patient wife now at bedside. Dr Dahlia Byes and myself had extensive discussion with the patient's wife regarding Vincent Poole current condition, progression of bladder CA resulting in large bowel obstruction, and overall prognosis being poor. At this time goals of care are not clearly determined. We recommended discussion between palliative care, oncology, and the patient's family. Appropriate consults have been placed.   From a surgical standpoint, he is a very poor surgical candidate. We discussed that in theory he could undergo palliative loop colostomy although he is at very significant risk for ventilator dependence, multiple extensive and severe complications, and has a very high risk of peri-operative mortality. In addition, she understands this surgery would not be curative by any means and only palliative in nature. Again, goals of care and patient wishes not established at this time which is why above parties consulted.   To help establish goals of care it should be determined:   1) Are the patient and his family interested in palliative surgery as discussed above with the understanding this carries significant risks including death?   2) In the emergent setting, such as perforation, does the patient want surgical intervention, again, with the understanding this carries significant risks including death?   Please feel free to call myself, or Dr Dahlia Byes, with questions or concerns.  -- Vincent Simon, PA-C  [facility] 06/07/2019, 1:34 PM [phone] M-F: 7am - 4pm

## 2019-06-07 NOTE — Progress Notes (Signed)
Pt to OR for colostomy. Report given to OR charge RN

## 2019-06-07 NOTE — Evaluation (Signed)
Physical Therapy Evaluation Patient Details Name: Vincent Poole MRN: [id number] DOB: 22-Jun-1935 Today's Date: 06/07/2019   History of Present Illness  84 y.o. male with a known history of bladder cancer with bilateral indwelling stents and chronic indwelling foley who has had multiple recent hosptializations.  Relevant PMH includes DVT,  GERD, anxiety, arthrits, history of atrial flutter, R femur fracture, HTN, prostate cancer, umbilical hernia, wound eschar L of foot, urinary catheter, multi bladder surgeries/procedures, admitted for sepsis/UTI.  Clinical Impression  Pt very hard of hearing, but willing to do some minimal mobility with PT.  He was adamant that he did not want to get to the recliner ("Aren't you the one who but in that chair for 2 hours last time!?").  Pt anxious and at times tearful t/o the session but ultimately did show ability to do some mobility and even take a few EOB steps, which he too was surprised that he did as well as he had.  Unsure as to what his true baseline has been recently, may benefit from STR based on feedback from him and family about goals and true baseline status.  Pt was, however, very limited today and would struggle at home even with 24/7 assist.    Follow Up Recommendations SNF;Supervision/Assistance - 24 hour(per pt and family input, he may not be too far from baseline)    Equipment Recommendations  None recommended by PT    Recommendations for Other Services       Precautions / Restrictions Precautions Precautions: Fall Restrictions Weight Bearing Restrictions: No      Mobility  Bed Mobility Overal bed mobility: Modified Independent             General bed mobility comments: slowly and with use of rails pt was able to get to sitting EOB w/o direct assist  Transfers Overall transfer level: Needs assistance Equipment used: Rolling walker (2 wheeled) Transfers: Sit to/from Stand Sit to Stand: Min assist         General  transfer comment: Pt was able to rise to standing with much encouragement but only minimal phyiscal assist.    Ambulation/Gait Ambulation/Gait assistance: Min assist Gait Distance (Feet): 3 Feet Assistive device: Rolling walker (2 wheeled)       General Gait Details: Pt absolutely unwilling to get over to recliner but was able to take a few small EOB shuffling steps with heavy UE use but no LOBs.    Stairs            Wheelchair Mobility    Modified Rankin (Stroke Patients Only)       Balance Overall balance assessment: Needs assistance Sitting-balance support: Bilateral upper extremity supported Sitting balance-Leahy Scale: Fair Sitting balance - Comments: Pt able to maintain sitting EOB balance w/o direct assist   Standing balance support: Bilateral upper extremity supported Standing balance-Leahy Scale: Fair Standing balance comment: highly reliant on the walker, but able to maintain standing balance better than expected                             Pertinent Vitals/Pain Pain Assessment: (c/o vague back pain)    Home Living Family/patient expects to be discharged to:: Unsure Living Arrangements: Spouse/significant other;Children Available Help at Discharge: Family;Available 24 hours/day Type of Home: Mobile home Home Access: Stairs to enter Entrance Stairs-Rails: Left Entrance Stairs-Number of Steps: 4-5 Home Layout: One level Home Equipment: Walker - 2 wheels      Prior  Function Level of Independence: Needs assistance   Gait / Transfers Assistance Needed: transfers with assist from family to RW. Ambulates with assistance with RW.   ADL's / Homemaking Assistance Needed: patient requires help with dressing, bathing at times. Requires help with IADLs.         Hand Dominance        Extremity/Trunk Assessment   Upper Extremity Assessment Upper Extremity Assessment: Generalized weakness    Lower Extremity Assessment Lower Extremity  Assessment: Generalized weakness       Communication   Communication: HOH(extremely HOH, communicated via writting much of the time)  Cognition Arousal/Alertness: Awake/alert Behavior During Therapy: Anxious(Pt fearful and anxious t/o the session) Overall Cognitive Status: Within Functional Limits for tasks assessed                                        General Comments      Exercises General Exercises - Lower Extremity Ankle Circles/Pumps: AROM;5 reps;Both Heel Slides: AROM;5 reps;Both Hip ABduction/ADduction: AROM;5 reps;Both Straight Leg Raises: AROM;5 reps;Both   Assessment/Plan    PT Assessment Patient needs continued PT services  PT Problem List Decreased strength;Decreased range of motion;Decreased activity tolerance;Decreased balance;Decreased mobility;Decreased coordination;Decreased knowledge of use of DME;Decreased safety awareness;Decreased knowledge of precautions;Pain       PT Treatment Interventions DME instruction;Gait training;Stair training;Functional mobility training;Therapeutic activities;Therapeutic exercise;Balance training;Neuromuscular re-education;Patient/family education    PT Goals (Current goals can be found in the Care Plan section)  Acute Rehab PT Goals Patient Stated Goal: Pt adamant that he did not want to get out of the bed to recliner, very anxous and upset about his situations PT Goal Formulation: Patient unable to participate in goal setting    Frequency Min 2X/week   Barriers to discharge        Co-evaluation               AM-PAC PT "6 Clicks" Mobility  Outcome Measure Help needed turning from your back to your side while in a flat bed without using bedrails?: A Little Help needed moving from lying on your back to sitting on the side of a flat bed without using bedrails?: A Little Help needed moving to and from a bed to a chair (including a wheelchair)?: A Little Help needed standing up from a chair using  your arms (e.g., wheelchair or bedside chair)?: A Lot Help needed to walk in hospital room?: A Lot Help needed climbing 3-5 steps with a railing? : Total 6 Click Score: 14    End of Session Equipment Utilized During Treatment: Gait belt Activity Tolerance: Patient limited by fatigue(pt very anxious and hard of hearing) Patient left: with bed alarm set;with call bell/phone within reach Nurse Communication: Mobility status PT Visit Diagnosis: Unsteadiness on feet (R26.81);Muscle weakness (generalized) (M62.81);Difficulty in walking, not elsewhere classified (R26.2)    Time: 1031-5945 PT Time Calculation (min) (ACUTE ONLY): 28 min   Charges:   PT Evaluation $PT Eval Low Complexity: 1 Low PT Treatments $Therapeutic Activity: 8-22 mins        Kreg Shropshire, DPT 06/07/2019, 10:33 AM

## 2019-06-07 NOTE — Progress Notes (Signed)
Pharmacy Antibiotic Note  Vincent Poole is a 83 y.o. male admitted on 06/06/2019 with UTI.  Pharmacy has been consulted for zosyn dosing.  Plan: Zosyn 3.375g IV q8h (4 hour infusion).  Patient grew Pseudomonas >100,000 colonies and citrobacter 80,000 colonies in urine cx on 04/26/19 w/ the citrobacter only resistant to cefazolin.  Height: 5\' 9"  (175.3 cm) Weight: 154 lb (69.9 kg) IBW/kg (Calculated) : 70.7  Temp (24hrs), Avg:97.9 F (36.6 C), Min:97.5 F (36.4 C), Max:98.2 F (36.8 C)  Recent Labs  Lab 06/06/19 2017  WBC 23.5*  CREATININE 1.38*  LATICACIDVEN 1.6    Estimated Creatinine Clearance: 40.8 mL/min (A) (by C-G formula based on SCr of 1.38 mg/dL (H)).    No Known Allergies  Thank you for allowing pharmacy to be a part of this patient's care.  Tobie Lords, PharmD, BCPS Clinical Pharmacist 06/07/2019 4:48 AM

## 2019-06-07 NOTE — Consult Note (Signed)
Hematology/Oncology Consult note [facility] Telephone:([phone] [phone]  Patient Care Team: [name], MD as PCP - General (Family Medicine)   Name of the patient: Vincent Poole  [id number]  01/19/1935    Reason for consult: Metastatic bladder cancer with large bowel obstruction   Requesting physician: Dr. Darvin Neighbours  Date of visit: 06/07/2019    History of presenting illness-patient is a 84 year old male with locally advanced high-grade urothelial carcinoma which was diagnosed in July 2018.  He was not deemed to be a candidate for definitive resection and underwent chemotherapy initially with carboplatin and gemcitabine.  He then had disease progression on that regimen and has been on Tecentriq since March 2019.  He was last seen by me on 05/28/2019 in the outpatient and received his Tecentriq on that day.  Overall patient is elderly and frail and we have discussed potentially stopping treatment and hospice in the past but patient and his wife desire treatment if he could tolerate it.  Patient presented to the ER with symptoms of acute onset abdominal pain as well as vomiting which started 2 days ago.He had CT abdomen and pelvis with contrast which showed large bowel obstruction secondary to bladder tumor with sharp transition zone in the lower descending colon consistent with malignant lesion direct invasion by the tumor or mass-effect from the tumor.  Cecum measuring 9 cm in length.  Patient has been seen by both urology and general surgery and has been deemed high risk for any operative procedure.  ECOG PS- 2-3  Pain scale- 4   Review of systems- Review of Systems  Constitutional: Positive for malaise/fatigue. Negative for chills, fever and weight loss.  HENT: Negative for congestion, ear discharge and nosebleeds.   Eyes: Negative for blurred vision.  Respiratory: Negative for cough, hemoptysis, sputum production, shortness of breath and  wheezing.   Cardiovascular: Negative for chest pain, palpitations, orthopnea and claudication.  Gastrointestinal: Positive for abdominal pain, nausea and vomiting. Negative for blood in stool, constipation, diarrhea, heartburn and melena.  Genitourinary: Negative for dysuria, flank pain, frequency, hematuria and urgency.  Musculoskeletal: Negative for back pain, joint pain and myalgias.  Skin: Negative for rash.  Neurological: Negative for dizziness, tingling, focal weakness, seizures, weakness and headaches.  Endo/Heme/Allergies: Does not bruise/bleed easily.  Psychiatric/Behavioral: Negative for depression and suicidal ideas. The patient does not have insomnia.     No Known Allergies  Patient Active Problem List   Diagnosis Date Noted   Large bowel obstruction ([place]) 06/06/2019   Hydronephrosis, right 04/08/2019   Pressure injury of skin 03/23/2019   SIRS (systemic inflammatory response syndrome) ([place]) 03/22/2019   Leukocytosis 50/38/8828   Complicated UTI (urinary tract infection) 11/14/2018   Palliative care encounter    Iron deficiency anemia 09/14/2018   Hip fracture ([place]) 05/01/2018   Malignant neoplasm of urinary bladder ([place]) 01/12/2018   Goals of care, counseling/discussion 01/12/2018   Pyelonephritis 05/31/2017   UTI (urinary tract infection) 03/04/2017   Urinary obstruction    Essential hypertension 08/30/2016   History of shingles 08/30/2016   Prostate cancer ([place]) 08/30/2016   Urinary retention 08/30/2016   Medicare annual wellness visit, initial 08/01/2016   Medicare annual wellness visit, subsequent 08/01/2016   Sepsis ([place]) 07/11/2016   Borderline diabetes mellitus 01/26/2016   Vaccine counseling 01/26/2015   Lumbar radiculitis 06/24/2014   OA (osteoarthritis) of hip 06/24/2014   Malignant neoplasm of prostate ([place]) 01/27/2011     Past Medical History:  Diagnosis Date   Anxiety  Arthritis    Bladder cancer ([place])     Dysrhythmia 07/2018   history of atrial flutter that worsens with anxiety   Femur fracture, right ([place]) 05/01/2018   GERD (gastroesophageal reflux disease)    History of recent blood transfusion 05/2018   Hypertension    Iron deficiency anemia 09/14/2018   Prostate cancer ([place]) 07/2018   cancer growing in prostate but not prostate cancer, it is from the bladder   Umbilical hernia 67/2094   Urinary retention 2019   foley catheter place 11/2017   UTI (urinary tract infection) 2019   frequent UTI's over last year   Wound eschar of foot 07/2018   left heal getting wrapped and requiring antibiotic cream. cracks open with weight bearing.     Past Surgical History:  Procedure Laterality Date   CARPAL TUNNEL RELEASE Right    CHOLECYSTECTOMY  2004   CYSTOGRAM  07/18/2018   Procedure: CYSTOGRAM;  Surgeon: [name], MD;  Location: [facility];  Service: Urology;;   CYSTOSCOPY W/ RETROGRADES Bilateral 07/18/2018   Procedure: CYSTOSCOPY WITH RETROGRADE PYELOGRAM;  Surgeon: [name], MD;  Location: [facility];  Service: Urology;  Laterality: Bilateral;   CYSTOSCOPY W/ URETERAL STENT PLACEMENT Bilateral 12/27/2017   Procedure: CYSTOSCOPY WITH RETROGRADE PYELOGRAM/URETERAL STENT PLACEMENT;  Surgeon: [name], MD;  Location: [facility];  Service: Urology;  Laterality: Bilateral;   CYSTOSCOPY W/ URETERAL STENT PLACEMENT Bilateral 07/18/2018   Procedure: CYSTOSCOPY WITH STENT REPLACEMENT (exchange);  Surgeon: [name], MD;  Location: [facility];  Service: Urology;  Laterality: Bilateral;   CYSTOSCOPY WITH STENT PLACEMENT Bilateral 11/12/2018   Procedure: [place] WITH STENT Exchange;  Surgeon: [name], MD;  Location: [facility];  Service: Urology;  Laterality: Bilateral;   INTRAMEDULLARY (IM) NAIL INTERTROCHANTERIC Right 05/02/2018   Procedure: INTRAMEDULLARY (IM) NAIL INTERTROCHANTRIC;  Surgeon: [name], MD;  Location: [facility];  Service: Orthopedics;   Laterality: Right;   IR NEPHROSTOMY EXCHANGE LEFT  03/25/2019   IR NEPHROSTOMY EXCHANGE LEFT  04/19/2019   IR NEPHROSTOMY EXCHANGE LEFT  05/31/2019   IR NEPHROSTOMY EXCHANGE RIGHT  03/25/2019   IR NEPHROSTOMY EXCHANGE RIGHT  04/19/2019   IR NEPHROSTOMY EXCHANGE RIGHT  05/31/2019   IR NEPHROSTOMY PLACEMENT LEFT  02/23/2019   IR NEPHROSTOMY PLACEMENT RIGHT  02/23/2019   LEG TENDON SURGERY Right 1958   PORTA CATH INSERTION N/A 01/22/2018   Procedure: PORTA CATH INSERTION;  Surgeon: [name], MD;  Location: [facility];  Service: Cardiovascular;  Laterality: N/A;   TRANSURETHRAL RESECTION OF BLADDER TUMOR N/A 12/27/2017   Procedure: TRANSURETHRAL RESECTION OF BLADDER TUMOR (TURBT);  Surgeon: [name], MD;  Location: [facility];  Service: Urology;  Laterality: N/A;   TRANSURETHRAL RESECTION OF BLADDER TUMOR N/A 01/15/2018   Procedure: TRANSURETHRAL RESECTION OF BLADDER TUMOR (TURBT);  Surgeon: [name], MD;  Location: [facility];  Service: Urology;  Laterality: N/A;  Need 2 hrs for this case please   TRANSURETHRAL RESECTION OF BLADDER TUMOR N/A 07/18/2018   Procedure: TRANSURETHRAL RESECTION OF BLADDER TUMOR (TURBT);  Surgeon: [name], MD;  Location: [facility];  Service: Urology;  Laterality: N/A;    Social History   Socioeconomic History   Marital status: Married    Spouse name: ruth   Number of children: Not on file   Years of education: Not on file   Highest education level: Not on file  Occupational History   Occupation: retired    Comment: Therapist, nutritional [place]  Financial resource strain: Patient refused   Food insecurity    Worry: Patient refused    Inability: Patient refused   Transportation needs    Medical: Patient refused    Non-medical: Patient refused  Tobacco Use   Smoking status: Never Smoker   Smokeless tobacco: Never Used  Substance and Sexual Activity   Alcohol use: No    Alcohol/week: 0.0 standard drinks    Drug use: No   Sexual activity: Not Currently  Lifestyle   Physical activity    Days per week: Patient refused    Minutes per session: Patient refused   Stress: Patient refused  Relationships   Social connections    Talks on phone: Patient refused    Gets together: Patient refused    Attends religious service: Patient refused    Active member of club or organization: Patient refused    Attends meetings of clubs or organizations: Patient refused    Relationship status: Patient refused   Intimate partner violence    Fear of current or ex partner: Patient refused    Emotionally abused: Patient refused    Physically abused: Patient refused    Forced sexual activity: Patient refused  Other Topics Concern   Not on file  Social History Narrative   Not on file     Family History  Problem Relation Age of Onset   Cancer Mother    Chronic Renal Failure Mother    Heart disease Father      Current Facility-Administered Medications:    enoxaparin (LOVENOX) injection 40 mg, 40 mg, Subcutaneous, Q24H, Seals, Angela H, NP, 40 mg at 06/07/19 4496   lactated ringers infusion, , Intravenous, Continuous, Sudini, Srikar, MD, Last Rate: 100 mL/hr at 06/07/19 0959   ondansetron (ZOFRAN) tablet 4 mg, 4 mg, Oral, Q6H PRN **OR** ondansetron (ZOFRAN) injection 4 mg, 4 mg, Intravenous, Q6H PRN, Seals, Angela H, NP   piperacillin-tazobactam (ZOSYN) IVPB 3.375 g, 3.375 g, Intravenous, Q8H, Harrie Foreman, MD, Last Rate: 12.5 mL/hr at 06/07/19 7591   potassium chloride 10 mEq in 100 mL IVPB, 10 mEq, Intravenous, Q1 Hr x 4, Sudini, Srikar, MD   sodium chloride flush (NS) 0.9 % injection 3 mL, 3 mL, Intravenous, Q12H, Seals, Angela H, NP, 3 mL at 06/07/19 1001   Physical exam:  Vitals:   06/07/19 0000 06/07/19 0030 06/07/19 0112 06/07/19 0347  BP: (!) 118/56 (!) 98/58 126/82 125/70  Pulse: 83 80 83 90  Resp: 15 17 18 16   Temp:    (!) 97.5 F (36.4 C)  TempSrc:    Oral  SpO2: 98%  99%  99%  Weight:      Height:       Physical Exam Constitutional:      Comments: Frail elderly gentleman in no acute distress.  Patient is extremely hard of hearing and communication is difficult but he can read lips  HENT:     Head: Normocephalic and atraumatic.  Eyes:     Pupils: Pupils are equal, round, and reactive to light.  Neck:     Musculoskeletal: Normal range of motion.  Cardiovascular:     Rate and Rhythm: Normal rate and regular rhythm.     Heart sounds: Normal heart sounds.  Pulmonary:     Effort: Pulmonary effort is normal.     Breath sounds: Normal breath sounds.  Abdominal:     General: There is distension.     Comments: firm  Skin:    General: Skin is warm  and dry.  Neurological:     Mental Status: He is alert and oriented to person, place, and time.        CMP Latest Ref Rng & Units 06/07/2019  Glucose 70 - 99 mg/dL 111(H)  BUN 8 - 23 mg/dL 22  Creatinine 0.61 - 1.24 mg/dL 1.24  Sodium 135 - 145 mmol/L 137  Potassium 3.5 - 5.1 mmol/L 3.0(L)  Chloride 98 - 111 mmol/L 104  CO2 22 - 32 mmol/L 25  Calcium 8.9 - 10.3 mg/dL 8.2(L)  Total Protein 6.5 - 8.1 g/dL -  Total Bilirubin 0.3 - 1.2 mg/dL -  Alkaline Phos 38 - 126 U/L -  AST 15 - 41 U/L -  ALT 0 - 44 U/L -   CBC Latest Ref Rng & Units 06/07/2019  WBC 4.0 - 10.5 K/uL 21.4(H)  Hemoglobin 13.0 - 17.0 g/dL 9.1(L)  Hematocrit 39.0 - 52.0 % 29.0(L)  Platelets 150 - 400 K/uL 410(H)    @IMAGES @  Ct Abdomen Pelvis W Contrast  Result Date: 06/06/2019 CLINICAL DATA:  Patient c/o abdominal pain, emesis, constipation, weakness. Patient is a cancer patient with weekly treatments in the cancer center (unsure if chemo). Patient has bilateral nephrostomy tubes. EXAM: CT ABDOMEN AND PELVIS WITH CONTRAST TECHNIQUE: Multidetector CT imaging of the abdomen and pelvis was performed using the standard protocol following bolus administration of intravenous contrast. CONTRAST:  168mL OMNIPAQUE IOHEXOL 300 MG/ML  SOLN  COMPARISON:  CT of the abdomen and pelvis on 04/16/2019, 02/21/2019 FINDINGS: Lower chest: Extensive coronary artery calcification. Pericardial thickening or small effusion. Small hiatal hernia. Hepatobiliary: Cholecystectomy. Liver is otherwise unremarkable. Pancreas: Atrophic pancreas normal in appearance. Spleen: Normal in size without focal abnormality. Adrenals/Urinary Tract: Adrenal glands are normal. Bilateral percutaneous nephrostomy catheters. Stable RIGHT renal cyst. Ureters are decompressed. Irregular soft tissue thickening of the urinary bladder wall, associated with surrounding inflammatory changes. There is a broad interface with the anterior abdominal wall, suspicious for involvement of the anterior abdominal wall. Stomach/Bowel: Hiatal hernia. Stomach is unremarkable. Small bowel loops are not dilated. The appendix is distended and contains appendicoliths. Appendix measures 13 millimeters in thickness. There is new, significant dilatation of the colon. Cecum measures 9.0 centimeters. The colon is dilated and stool filled throughout its course to level of the descending colon where there is a beak adjacent to the bladder mass, best seen on image 32/5. This abrupt transition zone is consistent with malignant adhesion or mass effects and causing significant obstruction. A second possible narrowing is also identified within the sigmoid colon best seen on coronal image 19/5. There is no evidence for free intraperitoneal air. Vascular/Lymphatic: Dense atherosclerotic calcifications of the abdominal aorta. No aneurysm. Reproductive: Prostatic calcifications. Previous TURP. Other: No ascites. Small amount of fluid within the RIGHT inguinal canal. Musculoskeletal: Degenerative changes are seen spine and hips, RIGHT greater than LEFT. Previous ORIF of the RIGHT hip. Significant degenerative changes and postoperative IMPRESSION: 1. Large bowel obstruction likely related to bladder tumor. Sharp transition zone  in the LOWER descending colon is consistent with malignant adhesion, direct invasion by the tumor, or mass effect from the tumor. Cecum measures up to 9 centimeters. 2. Suspect involvement of the anterior abdominal wall by bladder tumor. 3. No evidence for free intraperitoneal air. 4. Bilateral percutaneous nephrostomy catheters. 5. Hiatal hernia. 6. Coronary artery disease. 7. Previous TURP. 8. Aortic Atherosclerosis (ICD10-I70.0). These results were called by telephone at the time of interpretation on 06/06/2019 at 8:58 pm to Dr. Marjean Donna , who  verbally acknowledged these results. Electronically Signed   By: Nolon Nations M.D.   On: 06/06/2019 21:00   Ir Nephrostomy Exchange Left  Result Date: 05/31/2019 INDICATION: Routine exchange EXAM: FLUOROSCOPIC GUIDED BILATERAL SIDED NEPHROSTOMY CATHETER EXCHANGE COMPARISON:  Multiple previous fluoroscopic guided exchanges most recently on 04/19/2019 and 03/25/2019. CONTRAST:  A total of 20 mL Isovue-300 administered was administered into both collecting systems FLUOROSCOPY TIME:  36 seconds (9.5 mGy) COMPLICATIONS: None immediate. TECHNIQUE: Informed written consent was obtained from the patient after a discussion of the risks, benefits and alternatives to treatment. Questions regarding the procedure were encouraged and answered. A timeout was performed prior to the initiation of the procedure. The bilateral flanks and external portions of existing nephrostomy catheters were prepped and draped in the usual sterile fashion. A sterile drape was applied covering the operative field. Maximum barrier sterile technique with sterile gowns and gloves were used for the procedure. A timeout was performed prior to the initiation of the procedure. A pre procedural spot fluoroscopic image was obtained. Beginning with the left-sided nephrostomy, a small amount of contrast was injected via the existing left-sided nephrostomy catheter demonstrating appropriate positioning within  the renal pelvis. The existing nephrostomy catheter was cut and cannulated with a Benson wire which was coiled within the renal pelvis. Under intermittent fluoroscopic guidance, the existing nephrostomy catheter was exchanged for a new 12 [place] all-purpose drainage catheter. Limited contrast injection confirmed appropriate positioning within the left renal pelvis and a post exchange fluoroscopic image was obtained. The catheter was locked, secured to the skin with an interrupted suture and reconnected to a gravity bag. The identical repeat procedure was repeated for the contralateral right-sided nephrostomy, ultimately allowing successful exchange of a new 12 [place] all-purpose drainage catheter with end coiled and locked within the right renal pelvis. Dressings were placed. The patient tolerated the above procedures well without immediate postprocedural complication. FINDINGS: The existing nephrostomy catheters are appropriately positioned and functioning. After successful fluoroscopic guided exchange, new bilateral 12 French nephrostomy catheters are coiled and locked within the respective renal pelvises. IMPRESSION: Successful fluoroscopic guided exchange of bilateral 12 French percutaneous nephrostomy catheters. Electronically Signed   By: Sandi Mariscal M.D.   On: 05/31/2019 15:01   Ir Nephrostomy Exchange Right  Result Date: 05/31/2019 INDICATION: Routine exchange EXAM: FLUOROSCOPIC GUIDED BILATERAL SIDED NEPHROSTOMY CATHETER EXCHANGE COMPARISON:  Multiple previous fluoroscopic guided exchanges most recently on 04/19/2019 and 03/25/2019. CONTRAST:  A total of 20 mL Isovue-300 administered was administered into both collecting systems FLUOROSCOPY TIME:  36 seconds (9.5 mGy) COMPLICATIONS: None immediate. TECHNIQUE: Informed written consent was obtained from the patient after a discussion of the risks, benefits and alternatives to treatment. Questions regarding the procedure were encouraged and answered. A  timeout was performed prior to the initiation of the procedure. The bilateral flanks and external portions of existing nephrostomy catheters were prepped and draped in the usual sterile fashion. A sterile drape was applied covering the operative field. Maximum barrier sterile technique with sterile gowns and gloves were used for the procedure. A timeout was performed prior to the initiation of the procedure. A pre procedural spot fluoroscopic image was obtained. Beginning with the left-sided nephrostomy, a small amount of contrast was injected via the existing left-sided nephrostomy catheter demonstrating appropriate positioning within the renal pelvis. The existing nephrostomy catheter was cut and cannulated with a Benson wire which was coiled within the renal pelvis. Under intermittent fluoroscopic guidance, the existing nephrostomy catheter was exchanged for a new 12 [place] all-purpose  drainage catheter. Limited contrast injection confirmed appropriate positioning within the left renal pelvis and a post exchange fluoroscopic image was obtained. The catheter was locked, secured to the skin with an interrupted suture and reconnected to a gravity bag. The identical repeat procedure was repeated for the contralateral right-sided nephrostomy, ultimately allowing successful exchange of a new 12 [place] all-purpose drainage catheter with end coiled and locked within the right renal pelvis. Dressings were placed. The patient tolerated the above procedures well without immediate postprocedural complication. FINDINGS: The existing nephrostomy catheters are appropriately positioned and functioning. After successful fluoroscopic guided exchange, new bilateral 12 French nephrostomy catheters are coiled and locked within the respective renal pelvises. IMPRESSION: Successful fluoroscopic guided exchange of bilateral 12 French percutaneous nephrostomy catheters. Electronically Signed   By: Sandi Mariscal M.D.   On: 05/31/2019 15:01      Assessment and plan- Patient is a 84 y.o. male with locally advanced urothelial cancer now presenting with large bowel obstruction  Patient has had bladder cancer since July 2018.  He has been on immunotherapy for over a year now.  He now presents with malignant bowel obstruction from local invasion from the surrounding bladder tumor.  He had a prior CT scan in June 2020 where his bowels were seen in close proximity to his tumor but clinically patient was stable and did not have any symptoms of bowel obstruction back then as well as when I saw him on 05/28/2019.  I discussed with patient's wife that essentially we have 2 options at this time.  If there is no surgical intervention done on his bowel obstruction he is likely going to perforate and may not survive beyond a few days.  Hence if no surgery is performed goal would be to keep him comfortable and pain-free if and when he does have a perforation with adequate pain management  If family does opt for surgical management-in his case definitive surgical removal of the tumor is not possible and only a palliative diverting colostomy could be attempted if deemed appropriate by surgery.  He would still be high surgical risk and may not survive surgery.  If he does survive a diverting procedure-I do not think that he would be a candidate for further chemotherapy for definitive management of his tumor given his overall performance status being poor.  Radiation would also not be an option to treat bladder cancer that involves the bowel.  His overall prognosis is likely few weeks post surgery.  Patient's wife understands both the options and she will be discussing this further with the patient and her children.  She is leaning towards excepting the high surgical risk but considering a palliative diverting procedure. He is a DNR    Visit Diagnosis 1.  Metastatic urothelial carcinoma 2.  Large bowel obstruction  Dr. Randa Evens, MD, MPH [facility] at  [facility] [phone] 06/07/2019 3:25 PM

## 2019-06-07 NOTE — ED Notes (Signed)
Report given to [place], [place]

## 2019-06-07 NOTE — Progress Notes (Signed)
Patient with imminent large bowel perforation. Repeated BMP shows Potassium of 2.9 which was previously 3.0 when patient was posted. Due to the high risk of perforation with a cecum almost at 12 cm, I consider that this case should not be delayed and should proceed as an emergent situation. I discuss the case with Anesthesia. I will order replacement for Potassium and Magnesium. Will proceed with case.   Herbert Pun, MD

## 2019-06-07 NOTE — Progress Notes (Signed)
I had an hour long conversation w family and patient. Patient was very clear. HE wishes to spent his last days with his family, he lived a long life without regrets. Does not want surgery. Family very apprehensive and in Shock. The family was angry due to the situation being somewhat unexpected. Supportive rx provided. Palliative team at bedside.Marland Kitchen

## 2019-06-07 NOTE — Consult Note (Addendum)
Consultation Note Date: 06/07/2019   Patient Name: Vincent Poole  DOB: 01-04-1935  MRN: [id number]  Age / Sex: 84 y.o., male  PCP: [name], MD Referring Physician: Hillary Bow, MD  Reason for Consultation: Establishing goals of care  HPI/Patient Profile: 84 y.o. male  with past medical history of high-grade urothelial carcinoma followed by Dr. [name], recurrent UTI's, bilateral nephrostomy tubes, HTN, GERD, arthritis, and anxiety admitted on 06/06/2019 with emesis, abdominal pain, and constipation. CT abdomen/pelvis revealed large bowel obstruction secondary to bladder tumor with sharp transition zone in the lower descending colon consistent with malignant lesion direct invasion by tumor or mass-effect from tumor. General surgery, oncology, and urology following. Patient is high risk surgical candidate and for further chemotherapy/immunotherapy with poor performance status. Palliative medicine consultation for goals of care.   Clinical Assessment and Goals of Care:  I have reviewed medical records and discussed with care team (Pabon, Fae Pippin, and RN) and met with patient and family at bedside to discuss [place].  I introduced Palliative Medicine as specialized medical care for people living with serious illness. It focuses on providing relief from the symptoms and stress of a serious illness.   Upon arrival to room, Dr. Dahlia Byes and surgery PA at bedside speaking with patient and family about plan of care including high risk for mortality during surgery and/or complications/death following surgery. Discussed conservative approach including focus on comfort and allowing him to be with family at home with hospice. Discussed prognosis with both options.   Although patient is extremely hard of hearing, he is able to communicate with care team with white board/marker. He is alert, oriented and does have  capacity to make medical decisions. Patient and family tearful during discussion.   Family feels [name] should pursue surgical intervention, understanding risks but also having strong faith that he would survive and they may have more time with him. Initially, patient speaks of wanting to go "home" and spending his last days with family.   Introduced Insurance underwriter and philosophy. Wife initially appalled at the thought of hospice services and shares they want "nothing to do with hospice" because of an unfortunate situation she had with her father at EOL. Explained that if surgery is forgone and they wish to take him home at EOL, they will need support from hospice so he doesn't suffer and symptoms can be managed. Also explained that if surgery is pursued and he does survive off of ventilator, he would still benefit from home hospice support with poor prognosis from underlying metastatic cancer.   Introduced hospice facility as an option. Left room to review visitor policy with hospice liaison.     Upon arrival back to room, updated family on hospice visitor policy. They do share it is important to be as family at the end of his life.   Patient's wife and three children are now at bedside.   Wife also shares that they further discussed as a family and that Mr. Miley wishes to pursue surgical intervention, understanding high  risks for complications and mortality. Family reports that when this NP left the room, [name] shared that he would take the risk of dying on the table, knowing he tried everything and also knowing that he would not suffer (if he died on the operating room while sedated). Again discussed high risk for complications following surgery including inability to wean from ventilator and therefore leading to family withdrawing care to comfort prior to his death. Also, discussed in detail that surgical intervention is NOT curative but palliative in nature.... and as Dr. [name] has  explained to patient/family, prognosis is still poor possibly weeks if he survives surgery.   Also discussed in detail code status and recommendation for DNR with underlying irreversible condition, frailty, and poor chances of survival or meaningful recovery following a cardiac arrest. Explained code blue scenario. Patient/family consensus for DNR code status in event of cardiac arrest. Wife and son share they would NOT want to see him suffer or have ribs broken. Patient/family consensus for code status change to DNR in epic and understand a purple bracelet will be placed by nurse.    Shortly after discussion with family, this NP and RN Marshall Cork) spoke with patient alone via white board/marker. Again discussed in detail high risk for complications and death from surgery vs. Comfort focused care. Dr. [name] was on speaker phone. Patient tells Marshall Cork and I "yes, I want the surgery." He states, "I'll take the risk" and "I might survive." We asked if this is truly what he wishes for or if he felt pressured by the family and patient states "no, I do not feel pressured."   Tanailly and I followed back up with family in family waiting area. Dr. Windell Moment will see patient/family shortly.   Answered all questions and concerns. Therapeutic listening and emotional/spiritual support provided.     SUMMARY OF RECOMMENDATIONS    After extensive Brent discussion with patient and family, patient wishes to pursue surgical intervention for bowel obstruction, understanding high risk for complications or mortality following surgery. All consulting providers have discussed with patient and family today.   Patient/family consensus for code status to be changed to DNR in event of cardiac arrest.   Poor prognosis despite surgical intervention. PMT to continue to follow and discuss plan of care with patient/family following stabilization post-op. If he survives surgery, he will still be eligible for home hospice services  with poor prognosis.   PMT provider not at [facility] through the weekend but will f/u Monday to further support patient/family.    Code Status/Advance Care Planning:  DNR  Symptom Management:   Dilaudid 0.'5mg'$  IV q4h prn severe pain   Palliative Prophylaxis:   Aspiration, Delirium Protocol, Frequent Pain Assessment, Oral Care and Turn Reposition  Psycho-social/Spiritual:   Desire for further Chaplaincy support:yes  Additional Recommendations: Caregiving  Support/Resources, Compassionate Wean Education and Education on Hospice  Prognosis:   Poor prognosis  Discharge Planning: To Be Determined      Primary Diagnoses: Present on Admission:  Large bowel obstruction ([place])   I have reviewed the medical record, interviewed the patient and family, and examined the patient. The following aspects are pertinent.  Past Medical History:  Diagnosis Date   Anxiety    Arthritis    Bladder cancer ([place])    Dysrhythmia 07/2018   history of atrial flutter that worsens with anxiety   Femur fracture, right ([place]) 05/01/2018   GERD (gastroesophageal reflux disease)    History of recent blood transfusion 05/2018   Hypertension  Iron deficiency anemia 09/14/2018   Prostate cancer ([place]) 07/2018   cancer growing in prostate but not prostate cancer, it is from the bladder   Umbilical hernia 18/2993   Urinary retention 2019   foley catheter place 11/2017   UTI (urinary tract infection) 2019   frequent UTI's over last year   Wound eschar of foot 07/2018   left heal getting wrapped and requiring antibiotic cream. cracks open with weight bearing.   Social History   Socioeconomic History   Marital status: Married    Spouse name: ruth   Number of children: Not on file   Years of education: Not on file   Highest education level: Not on file  Occupational History   Occupation: retired    Comment: Barista strain: Patient  refused   Food insecurity    Worry: Patient refused    Inability: Patient refused   Transportation needs    Medical: Patient refused    Non-medical: Patient refused  Tobacco Use   Smoking status: Never Smoker   Smokeless tobacco: Never Used  Substance and Sexual Activity   Alcohol use: No    Alcohol/week: 0.0 standard drinks   Drug use: No   Sexual activity: Not Currently  Lifestyle   Physical activity    Days per week: Patient refused    Minutes per session: Patient refused   Stress: Patient refused  Relationships   Social connections    Talks on phone: Patient refused    Gets together: Patient refused    Attends religious service: Patient refused    Active member of club or organization: Patient refused    Attends meetings of clubs or organizations: Patient refused    Relationship status: Patient refused  Other Topics Concern   Not on file  Social History Narrative   Not on file   Family History  Problem Relation Age of Onset   Cancer Mother    Chronic Renal Failure Mother    Heart disease Father    Scheduled Meds:  enoxaparin (LOVENOX) injection  40 mg Subcutaneous Q24H   sodium chloride flush  3 mL Intravenous Q12H   Continuous Infusions:  lactated ringers 100 mL/hr at 06/07/19 0959   piperacillin-tazobactam (ZOSYN)  IV 3.375 g (06/07/19 1400)   potassium chloride 10 mEq (06/07/19 1600)   PRN Meds:.HYDROmorphone (DILAUDID) injection, ondansetron **OR** ondansetron (ZOFRAN) IV Medications Prior to Admission:  Prior to Admission medications   Medication Sig Start Date End Date Taking? Authorizing Provider  acetaminophen (TYLENOL) 500 MG tablet Take 1,000 mg by mouth every 6 (six) hours as needed for moderate pain or headache.    Yes [provider]  cetirizine (ZYRTEC) 10 MG tablet Take 10 mg by mouth daily as needed for allergies.    Yes [provider]  Cranberry-Cholecalciferol (SUPER CRANBERRY/VITAMIN D3) 4200-500  MG-UNIT CAPS Take 1 capsule by mouth 2 (two) times daily.   Yes [provider]  ELIQUIS 5 MG TABS tablet TAKE ONE TABLET BY MOUTH TWICE DAILY 06/03/19  Yes Sindy Guadeloupe, MD  ferrous sulfate 325 (65 FE) MG tablet Take 1 tablet (325 mg total) by mouth 2 (two) times daily with a meal. 05/06/18  Yes Gouru, Aruna, MD  metoprolol tartrate (LOPRESSOR) 25 MG tablet Take 1 tablet (25 mg total) by mouth 2 (two) times daily. 04/29/19  Yes Fritzi Mandes, MD  Multiple Vitamin (MULTIVITAMIN WITH MINERALS) TABS tablet Take 1 tablet by mouth daily.  Yes [provider]  MYRBETRIQ 50 MG TB24 tablet TAKE 1 TABLET BY MOUTH DAILY 04/30/19  Yes McGowan, Larene Beach A, PA-C  nystatin [facility]) powder Apply topically 2 (two) times daily. 09/28/18  Yes McGowan, Larene Beach A, PA-C  oxyCODONE (OXY IR/ROXICODONE) 5 MG immediate release tablet Take 1 tablet (5 mg total) by mouth every 4 (four) hours as needed for severe pain. 03/21/19  Yes Sindy Guadeloupe, MD  polyethylene glycol powder (MIRALAX) powder Take 17 g by mouth daily as needed. Can increase to 3 times a day as needed for constipation but hold medication if has diarrhea 01/30/18  Yes Sindy Guadeloupe, MD  vitamin B-12 (CYANOCOBALAMIN) 500 MCG tablet Take 500 mcg by mouth daily.   Yes [provider]  zinc oxide 20 % ointment Apply 1 application topically as needed for irritation.   Yes [provider]  lidocaine-prilocaine (EMLA) cream Apply 1 application topically as needed. 01/02/19   Sindy Guadeloupe, MD   No Known Allergies Review of Systems  Constitutional: Positive for activity change.  Gastrointestinal: Positive for nausea.  Neurological: Positive for weakness.   Physical Exam Vitals signs and nursing note reviewed.  Constitutional:      General: He is awake.     Appearance: He is ill-appearing.  HENT:     Head: Normocephalic and atraumatic.  Pulmonary:     Effort: No tachypnea, accessory muscle usage or respiratory distress.    Abdominal:     Tenderness: There is no abdominal tenderness.  Neurological:     Mental Status: He is alert and oriented to person, place, and time.     Comments: Extremely hard of hearing. Using white board to communicate. Alert, oriented, and decisional.     Vital Signs: BP (!) 119/59 (BP Location: Right Arm)    Pulse 97    Temp 99 F (37.2 C) (Oral)    Resp 20    Ht '5\' 9"'$  (1.753 m)    Wt 69.9 kg    SpO2 98%    BMI 22.74 kg/m  Pain Scale: 0-10   Pain Score: 0-No pain   SpO2: SpO2: 98 % O2 Device:SpO2: 98 % O2 Flow Rate: .   IO: Intake/output summary:   Intake/Output Summary (Last 24 hours) at 06/07/2019 1731 Last data filed at 06/07/2019 1600 Gross per 24 hour  Intake 1339.34 ml  Output 550 ml  Net 789.34 ml    LBM: Last BM Date: (unknown ) Baseline Weight: Weight: 69.9 kg Most recent weight: Weight: 69.9 kg     Palliative Assessment/Data: PPS 40     Time In: 1515 Time Out: 1845 Time Total: 210 minutes   Greater than 50%  of this time was spent counseling and coordinating care related to the above assessment and plan.  Signed by:  Ihor Dow, DNP, FNP-C Palliative Medicine Team  Phone: [phone] Fax: [phone]   Please contact Palliative Medicine Team phone at [phone] for questions and concerns.  For individual provider: See Shea Evans

## 2019-06-07 NOTE — Progress Notes (Signed)
SURGICAL PROGRESS NOTE     Interval History: Patient was admitted yesterday due to large bowel obstruction.  Patient has history of bladder cancer with metastases.  He has been treated with immunotherapy but he got weak and he needed to be put on hold.  He somewhat recover a little bit and it was already anybody again he developed significant weakness and has not been tolerating any more therapies.  The disease has progressed significantly to the point that he has a large bowel obstruction.  He was initially evaluated by Dr. Celine Ahr who assessed that patient was a poor surgical candidate and recommended palliative care evaluation.  He was also evaluated by Dr. Dahlia Byes today who had a long discussion with the family explaining the risks of surgery and again reinforced the patient is a very poor surgical candidate.  At the moment apparently the patient expressed that he just want to be with the family.  As soon as he got out of the room apparently the patient change his mind and consented for the surgery.  At that time he was evaluated by palliative care who sat down with the patient without the family and he agreed to proceed with the surgery despite the high risk of mortality and the high rates of complications such as respiratory failure, bowel obstruction, perforation, infection, hepatic failure, cardiac complications, pneumonia, DVT and again even death.  I was called by oncologist and palliative care service to evaluate the patient to discuss surgical management.  No Known Allergies      Patient Active Problem List   Diagnosis Date Noted  . Large bowel obstruction ([place]) 06/06/2019  . Hydronephrosis, right 04/08/2019  . Pressure injury of skin 03/23/2019  . SIRS (systemic inflammatory response syndrome) ([place]) 03/22/2019  . Leukocytosis 02/21/2019  . Complicated UTI (urinary tract infection) 11/14/2018  . Palliative care encounter   . Iron deficiency anemia 09/14/2018  . Hip fracture ([place])  05/01/2018  . Malignant neoplasm of urinary bladder ([place]) 01/12/2018  . Goals of care, counseling/discussion 01/12/2018  . Pyelonephritis 05/31/2017  . UTI (urinary tract infection) 03/04/2017  . Urinary obstruction   . Essential hypertension 08/30/2016  . History of shingles 08/30/2016  . Prostate cancer ([place]) 08/30/2016  . Urinary retention 08/30/2016  . Medicare annual wellness visit, initial 08/01/2016  . Medicare annual wellness visit, subsequent 08/01/2016  . Sepsis ([place]) 07/11/2016  . Borderline diabetes mellitus 01/26/2016  . Vaccine counseling 01/26/2015  . Lumbar radiculitis 06/24/2014  . OA (osteoarthritis) of hip 06/24/2014  . Malignant neoplasm of prostate ([place]) 01/27/2011         Past Medical History:  Diagnosis Date  . Anxiety   . Arthritis   . Bladder cancer ([place])   . Dysrhythmia 07/2018   history of atrial flutter that worsens with anxiety  . Femur fracture, right ([place]) 05/01/2018  . GERD (gastroesophageal reflux disease)   . History of recent blood transfusion 05/2018  . Hypertension   . Iron deficiency anemia 09/14/2018  . Prostate cancer ([place]) 07/2018   cancer growing in prostate but not prostate cancer, it is from the bladder  . Umbilical hernia 62/7035  . Urinary retention 2019   foley catheter place 11/2017  . UTI (urinary tract infection) 2019   frequent UTI's over last year  . Wound eschar of foot 07/2018   left heal getting wrapped and requiring antibiotic cream. cracks open with weight bearing.          Past Surgical History:  Procedure Laterality  Date  . CARPAL TUNNEL RELEASE Right   . CHOLECYSTECTOMY  2004  . CYSTOGRAM  07/18/2018   Procedure: CYSTOGRAM;  Surgeon: [name], MD;  Location: [facility];  Service: Urology;;  . Consuela Mimes W/ RETROGRADES Bilateral 07/18/2018   Procedure: CYSTOSCOPY WITH RETROGRADE PYELOGRAM;  Surgeon: [name], MD;  Location: [facility];  Service: Urology;  Laterality: Bilateral;   . CYSTOSCOPY W/ URETERAL STENT PLACEMENT Bilateral 12/27/2017   Procedure: CYSTOSCOPY WITH RETROGRADE PYELOGRAM/URETERAL STENT PLACEMENT;  Surgeon: [name], MD;  Location: [facility];  Service: Urology;  Laterality: Bilateral;  . CYSTOSCOPY W/ URETERAL STENT PLACEMENT Bilateral 07/18/2018   Procedure: CYSTOSCOPY WITH STENT REPLACEMENT (exchange);  Surgeon: [name], MD;  Location: [facility];  Service: Urology;  Laterality: Bilateral;  . CYSTOSCOPY WITH STENT PLACEMENT Bilateral 11/12/2018   Procedure: [place] WITH STENT Exchange;  Surgeon: [name], MD;  Location: [facility];  Service: Urology;  Laterality: Bilateral;  . INTRAMEDULLARY (IM) NAIL INTERTROCHANTERIC Right 05/02/2018   Procedure: INTRAMEDULLARY (IM) NAIL INTERTROCHANTRIC;  Surgeon: [name], MD;  Location: [facility];  Service: Orthopedics;  Laterality: Right;  . IR NEPHROSTOMY EXCHANGE LEFT  03/25/2019  . IR NEPHROSTOMY EXCHANGE LEFT  04/19/2019  . IR NEPHROSTOMY EXCHANGE LEFT  05/31/2019  . IR NEPHROSTOMY EXCHANGE RIGHT  03/25/2019  . IR NEPHROSTOMY EXCHANGE RIGHT  04/19/2019  . IR NEPHROSTOMY EXCHANGE RIGHT  05/31/2019  . IR NEPHROSTOMY PLACEMENT LEFT  02/23/2019  . IR NEPHROSTOMY PLACEMENT RIGHT  02/23/2019  . LEG TENDON SURGERY Right 1958  . PORTA CATH INSERTION N/A 01/22/2018   Procedure: PORTA CATH INSERTION;  Surgeon: [name], MD;  Location: [place] [facility];  Service: Cardiovascular;  Laterality: N/A;  . TRANSURETHRAL RESECTION OF BLADDER TUMOR N/A 12/27/2017   Procedure: TRANSURETHRAL RESECTION OF BLADDER TUMOR (TURBT);  Surgeon: [name], MD;  Location: [facility];  Service: Urology;  Laterality: N/A;  . TRANSURETHRAL RESECTION OF BLADDER TUMOR N/A 01/15/2018   Procedure: TRANSURETHRAL RESECTION OF BLADDER TUMOR (TURBT);  Surgeon: [name], MD;  Location: [facility];  Service: Urology;  Laterality: N/A;  Need 2 hrs for this case please  . TRANSURETHRAL RESECTION OF BLADDER  TUMOR N/A 07/18/2018   Procedure: TRANSURETHRAL RESECTION OF BLADDER TUMOR (TURBT);  Surgeon: [name], MD;  Location: [facility];  Service: Urology;  Laterality: N/A;    Social History        Socioeconomic History  . Marital status: Married    Spouse name: ruth  . Number of children: Not on file  . Years of education: Not on file  . Highest education level: Not on file  Occupational History  . Occupation: retired    Comment: Therapist, [employer]  Social Needs  . Financial resource strain: Patient refused  . Food insecurity    Worry: Patient refused    Inability: Patient refused  . Transportation needs    Medical: Patient refused    Non-medical: Patient refused  Tobacco Use  . Smoking status: Never Smoker  . Smokeless tobacco: Never Used  Substance and Sexual Activity  . Alcohol use: No    Alcohol/week: 0.0 standard drinks  . Drug use: No  . Sexual activity: Not Currently  Lifestyle  . Physical activity    Days per week: Patient refused    Minutes per session: Patient refused  . Stress: Patient refused  Relationships  . Social connections    Talks on phone: Patient refused    Gets together: Patient refused    Attends  religious service: Patient refused    Active member of club or organization: Patient refused    Attends meetings of clubs or organizations: Patient refused    Relationship status: Patient refused  . Intimate partner violence    Fear of current or ex partner: Patient refused    Emotionally abused: Patient refused    Physically abused: Patient refused    Forced sexual activity: Patient refused  Other Topics Concern  . Not on file  Social History Narrative  . Not on file          Family History  Problem Relation Age of Onset  . Cancer Mother   . Chronic Renal Failure Mother   . Heart disease Father      Current Facility-Administered Medications:  .  enoxaparin (LOVENOX) injection 40 mg, 40 mg,  Subcutaneous, Q24H, Seals, Angela H, NP, 40 mg at 06/07/19 0204 .  lactated ringers infusion, , Intravenous, Continuous, Sudini, Srikar, MD, Last Rate: 100 mL/hr at 06/07/19 0959 .  ondansetron (ZOFRAN) tablet 4 mg, 4 mg, Oral, Q6H PRN **OR** ondansetron (ZOFRAN) injection 4 mg, 4 mg, Intravenous, Q6H PRN, Seals, Angela H, NP .  piperacillin-tazobactam (ZOSYN) IVPB 3.375 g, 3.375 g, Intravenous, Q8H, Harrie Foreman, MD, Last Rate: 12.5 mL/hr at 06/07/19 7829 .  potassium chloride 10 mEq in 100 mL IVPB, 10 mEq, Intravenous, Q1 Hr x 4, Sudini, Srikar, MD .  sodium chloride flush (NS) 0.9 % injection 3 mL, 3 mL, Intravenous, Q12H, Seals, Angela H, NP, 3 mL at 06/07/19 1001   Review of Systems:  Constitutional: denies fever, chills.  Positive for weight loss, positive for fatigue HEENT: denies cough or congestion  Respiratory: Positive any shortness of breath.  Cardiovascular: denies chest pain or palpitations  Gastrointestinal: Positive abdominal pain, N/V, Genitourinary: denies burning with urination or urinary frequency Musculoskeletal: Positive pain, negative decreased motor or sensation Integumentary: denies any other rashes or skin discolorations Neurological: denies HA or vision/hearing changes   Vital signs in last 24 hours: [min-max] current  Temp:  [97.5 F (36.4 C)-99 F (37.2 C)] 97.5 F (36.4 C) (07/31 1920) Pulse Rate:  [80-97] 95 (07/31 1920) Resp:  [15-23] 18 (07/31 1920) BP: (98-126)/(56-82) 119/63 (07/31 1920) SpO2:  [98 %-100 %] 99 % (07/31 1920) Weight:  [69.9 kg] 69.9 kg (07/30 1945)     Height: 5\' 9"  (175.3 cm) Weight: 69.9 kg BMI (Calculated): 22.73   Physical Exam Constitutional:   Weak, no distress HENT:     Head: Normocephalic and atraumatic.  Eyes:     Pupils: Pupils are equal, round, and reactive to light.  Neck:     Musculoskeletal: Normal range of motion.  Cardiovascular:     Rate and Rhythm: Normal rate and regular rhythm.     Heart sounds:  Normal heart sounds.  Pulmonary: Normal breath sound, no breathing at rest Abdominal: There is abdominal distention and mild tender to palpation.  There is induration on the lower abdomen compatible with the large bladder tumor.  There is no rebound tenderness  Skin:    General: Skin is warm and dry.  Neurological:     Mental Status: He is alert and oriented to person, place, and time.   Labs:  CBC Latest Ref Rng & Units 06/07/2019 06/06/2019 05/28/2019  WBC 4.0 - 10.5 K/uL 21.4(H) 23.5(H) 19.5(H)  Hemoglobin 13.0 - 17.0 g/dL 9.1(L) 9.7(L) 9.2(L)  Hematocrit 39.0 - 52.0 % 29.0(L) 30.1(L) 29.1(L)  Platelets 150 - 400 K/uL 410(H) 547(H) 356  CMP Latest Ref Rng & Units 06/07/2019 06/06/2019 05/28/2019  Glucose 70 - 99 mg/dL 111(H) 169(H) 120(H)  BUN 8 - 23 mg/dL 22 25(H) 23  Creatinine 0.61 - 1.24 mg/dL 1.24 1.38(H) 1.39(H)  Sodium 135 - 145 mmol/L 137 134(L) 135  Potassium 3.5 - 5.1 mmol/L 3.0(L) 2.2(LL) 3.6  Chloride 98 - 111 mmol/L 104 100 100  CO2 22 - 32 mmol/L 25 22 24   Calcium 8.9 - 10.3 mg/dL 8.2(L) 8.4(L) 8.6(L)  Total Protein 6.5 - 8.1 g/dL - 6.6 7.2  Total Bilirubin 0.3 - 1.2 mg/dL - 0.7 0.4  Alkaline Phos 38 - 126 U/L - 75 57  AST 15 - 41 U/L - 11(L) 13(L)  ALT 0 - 44 U/L - 7 8    Imaging studies: I personally evaluated the images of the CT scan.  Identified the lateral obstruction.  There is a irregular mass in the pelvis.  I also evaluated the abdominal x-ray with more than 11 cm of cecal distention.  EXAM: CT ABDOMEN AND PELVIS WITH CONTRAST  TECHNIQUE: Multidetector CT imaging of the abdomen and pelvis was performed using the standard protocol following bolus administration of intravenous contrast.  CONTRAST:  133mL OMNIPAQUE IOHEXOL 300 MG/ML  SOLN  COMPARISON:  CT of the abdomen and pelvis on 04/16/2019, 02/21/2019  FINDINGS: Lower chest: Extensive coronary artery calcification. Pericardial thickening or small effusion. Small hiatal  hernia.  Hepatobiliary: Cholecystectomy. Liver is otherwise unremarkable.  Pancreas: Atrophic pancreas normal in appearance.  Spleen: Normal in size without focal abnormality.  Adrenals/Urinary Tract: Adrenal glands are normal.  Bilateral percutaneous nephrostomy catheters. Stable RIGHT renal cyst. Ureters are decompressed.  Irregular soft tissue thickening of the urinary bladder wall, associated with surrounding inflammatory changes. There is a broad interface with the anterior abdominal wall, suspicious for involvement of the anterior abdominal wall.  Stomach/Bowel: Hiatal hernia. Stomach is unremarkable. Small bowel loops are not dilated. The appendix is distended and contains appendicoliths. Appendix measures 13 millimeters in thickness.  There is new, significant dilatation of the colon. Cecum measures 9.0 centimeters. The colon is dilated and stool filled throughout its course to level of the descending colon where there is a beak adjacent to the bladder mass, best seen on image 32/5. This abrupt transition zone is consistent with malignant adhesion or mass effects and causing significant obstruction. A second possible narrowing is also identified within the sigmoid colon best seen on coronal image 19/5.  There is no evidence for free intraperitoneal air.  Vascular/Lymphatic: Dense atherosclerotic calcifications of the abdominal aorta. No aneurysm.  Reproductive: Prostatic calcifications. Previous TURP.  Other: No ascites. Small amount of fluid within the RIGHT inguinal canal.  Musculoskeletal: Degenerative changes are seen spine and hips, RIGHT greater than LEFT. Previous ORIF of the RIGHT hip. Significant degenerative changes and postoperative  IMPRESSION: 1. Large bowel obstruction likely related to bladder tumor. Sharp transition zone in the LOWER descending colon is consistent with malignant adhesion, direct invasion by the tumor, or mass  effect from the tumor. Cecum measures up to 9 centimeters. 2. Suspect involvement of the anterior abdominal wall by bladder tumor. 3. No evidence for free intraperitoneal air. 4. Bilateral percutaneous nephrostomy catheters. 5. Hiatal hernia. 6. Coronary artery disease. 7. Previous TURP. 8. Aortic Atherosclerosis (ICD10-I70.0).  These results were called by telephone at the time of interpretation on 06/06/2019 at 8:58 pm to Dr. Marjean Donna , who verbally acknowledged these results.   Electronically Signed   By: Nolon Nations M.D.  On: 06/06/2019 21:00  Assessment/Plan:  84 y.o. male with large bowel obstruction, complicated by pertinent comorbidities including bladder cancer with metastases that has not responded to immunotherapy, hypertension, hydronephrosis, severe weakness. Patient with above-mentioned history with large bowel obstruction.  I spent more than an hour discussing with the family and the patient about the surgical risk.  The patient already knew about the need of surgery due to large bowel obstruction and the intermittent perforation.  As previously stated on the third surgeon to evaluate the patient.  Patient want to proceed with surgery despite understanding the severe risk of proceeding with surgery to include that he may die on the table during surgery or that he may not be able to get out from a ventilator.  They were told that he has a 30% chance of surviving recovering and they want to take that chance.  The patient and the family reports that they want to fight and try to live better instead of knowing that he is even a perforated in the next few days and die uncomfortable from the perforation.  He preferred if he has a small chance to die without the perforation with a colostomy to take that chance.  The patient is very clear about the risk and he wants to proceed.  Arnold Long, MD

## 2019-06-07 NOTE — Progress Notes (Addendum)
Attempts made by this RN and another RN to put in a NG tube. Pt could not tolerate it. MD made aware and stated it should be attempted again on day shift

## 2019-06-07 NOTE — Consult Note (Addendum)
[facility] SURGICAL CONSULTATION NOTE (initial) - cpt: [phone] (Outpatient/ED)   HISTORY OF PRESENT ILLNESS (HPI):  Patient is very hard of hearing and history primarily obtained through chart review and discussion with medical care team members.   84 y.o. male presented to [facility] ED yesterday evening (07/30) for evaluation of abdominal pain, nausea, and emesis. Patient reporting ~6 day history of abdominal pain which has been intermittent and crampy in nature. Nothing seems to bring on the pain and nothing specific provides relief. He does endorse associated nausea, emesis, and decreased PO intake. No reports of fever, chills , cough, SOB, or chest pain. His last bowel movement was around 5 days ago near onset of symptoms. Of note, he has a history of bladder cancer being followed by urology (Dr Erlene Quan, MD) for which he is undergoing immunotherapy after 5 cycles of chemotherapy. Has undergone tumor debulking but otherwise felt not to be a surgical candidate. He has a chronic foley catheter. He was admitted on 06/22 for psuedomonas UTI. He additionally has bilateral PCN which were last exchanged on 07/24. Work up in the ED last night was concerning for hypokalemia (2.2), chronic leukocytosis to 23.5K, and and large bowel obstruction most likely related to bladder CA.    Surgery is consulted by emergency medicine physician Dr. Marjean Donna, MD in this context for evaluation and management of large bowel obstruction.   PAST MEDICAL HISTORY (PMH):  Past Medical History:  Diagnosis Date  . Anxiety   . Arthritis   . Bladder cancer ([place])   . Dysrhythmia 07/2018   history of atrial flutter that worsens with anxiety  . Femur fracture, right ([place]) 05/01/2018  . GERD (gastroesophageal reflux disease)   . History of recent blood transfusion 05/2018  . Hypertension   . Iron deficiency anemia 09/14/2018  . Prostate cancer ([place]) 07/2018   cancer growing in prostate but not prostate cancer, it  is from the bladder  . Umbilical hernia 11/930  . Urinary retention 2019   foley catheter place 11/2017  . UTI (urinary tract infection) 2019   frequent UTI's over last year  . Wound eschar of foot 07/2018   left heal getting wrapped and requiring antibiotic cream. cracks open with weight bearing.     PAST SURGICAL HISTORY ([place]):  Past Surgical History:  Procedure Laterality Date  . CARPAL TUNNEL RELEASE Right   . CHOLECYSTECTOMY  2004  . CYSTOGRAM  07/18/2018   Procedure: CYSTOGRAM;  Surgeon: [name], MD;  Location: [facility];  Service: Urology;;  . Consuela Mimes W/ RETROGRADES Bilateral 07/18/2018   Procedure: CYSTOSCOPY WITH RETROGRADE PYELOGRAM;  Surgeon: [name], MD;  Location: [facility];  Service: Urology;  Laterality: Bilateral;  . CYSTOSCOPY W/ URETERAL STENT PLACEMENT Bilateral 12/27/2017   Procedure: CYSTOSCOPY WITH RETROGRADE PYELOGRAM/URETERAL STENT PLACEMENT;  Surgeon: [name], MD;  Location: [facility];  Service: Urology;  Laterality: Bilateral;  . CYSTOSCOPY W/ URETERAL STENT PLACEMENT Bilateral 07/18/2018   Procedure: CYSTOSCOPY WITH STENT REPLACEMENT (exchange);  Surgeon: [name], MD;  Location: [facility];  Service: Urology;  Laterality: Bilateral;  . CYSTOSCOPY WITH STENT PLACEMENT Bilateral 11/12/2018   Procedure: [place] WITH STENT Exchange;  Surgeon: [name], MD;  Location: [facility];  Service: Urology;  Laterality: Bilateral;  . INTRAMEDULLARY (IM) NAIL INTERTROCHANTERIC Right 05/02/2018   Procedure: INTRAMEDULLARY (IM) NAIL INTERTROCHANTRIC;  Surgeon: [name], MD;  Location: [facility];  Service: Orthopedics;  Laterality: Right;  . IR NEPHROSTOMY EXCHANGE LEFT  03/25/2019  . IR  NEPHROSTOMY EXCHANGE LEFT  04/19/2019  . IR NEPHROSTOMY EXCHANGE LEFT  05/31/2019  . IR NEPHROSTOMY EXCHANGE RIGHT  03/25/2019  . IR NEPHROSTOMY EXCHANGE RIGHT  04/19/2019  . IR NEPHROSTOMY EXCHANGE RIGHT  05/31/2019  . IR NEPHROSTOMY PLACEMENT LEFT  02/23/2019   . IR NEPHROSTOMY PLACEMENT RIGHT  02/23/2019  . LEG TENDON SURGERY Right 1958  . PORTA CATH INSERTION N/A 01/22/2018   Procedure: PORTA CATH INSERTION;  Surgeon: [name], MD;  Location: [facility];  Service: Cardiovascular;  Laterality: N/A;  . TRANSURETHRAL RESECTION OF BLADDER TUMOR N/A 12/27/2017   Procedure: TRANSURETHRAL RESECTION OF BLADDER TUMOR (TURBT);  Surgeon: [name], MD;  Location: [facility];  Service: Urology;  Laterality: N/A;  . TRANSURETHRAL RESECTION OF BLADDER TUMOR N/A 01/15/2018   Procedure: TRANSURETHRAL RESECTION OF BLADDER TUMOR (TURBT);  Surgeon: [name], MD;  Location: [facility];  Service: Urology;  Laterality: N/A;  Need 2 hrs for this case please  . TRANSURETHRAL RESECTION OF BLADDER TUMOR N/A 07/18/2018   Procedure: TRANSURETHRAL RESECTION OF BLADDER TUMOR (TURBT);  Surgeon: [name], MD;  Location: [facility];  Service: Urology;  Laterality: N/A;     MEDICATIONS:  Prior to Admission medications   Medication Sig Start Date End Date Taking? Authorizing Provider  acetaminophen (TYLENOL) 500 MG tablet Take 1,000 mg by mouth every 6 (six) hours as needed for moderate pain or headache.    Yes [provider]  cetirizine (ZYRTEC) 10 MG tablet Take 10 mg by mouth daily as needed for allergies.    Yes [provider]  Cranberry-Cholecalciferol (SUPER CRANBERRY/VITAMIN D3) 4200-500 MG-UNIT CAPS Take 1 capsule by mouth 2 (two) times daily.   Yes [provider]  ELIQUIS 5 MG TABS tablet TAKE ONE TABLET BY MOUTH TWICE DAILY 06/03/19  Yes Sindy Guadeloupe, MD  ferrous sulfate 325 (65 FE) MG tablet Take 1 tablet (325 mg total) by mouth 2 (two) times daily with a meal. 05/06/18  Yes Gouru, Aruna, MD  metoprolol tartrate (LOPRESSOR) 25 MG tablet Take 1 tablet (25 mg total) by mouth 2 (two) times daily. 04/29/19  Yes Fritzi Mandes, MD  Multiple Vitamin (MULTIVITAMIN WITH MINERALS) TABS tablet Take 1 tablet by mouth daily.   Yes  [provider]  MYRBETRIQ 50 MG TB24 tablet TAKE 1 TABLET BY MOUTH DAILY 04/30/19  Yes McGowan, Larene Beach A, PA-C  nystatin [facility]) powder Apply topically 2 (two) times daily. 09/28/18  Yes McGowan, Larene Beach A, PA-C  oxyCODONE (OXY IR/ROXICODONE) 5 MG immediate release tablet Take 1 tablet (5 mg total) by mouth every 4 (four) hours as needed for severe pain. 03/21/19  Yes Sindy Guadeloupe, MD  polyethylene glycol powder (MIRALAX) powder Take 17 g by mouth daily as needed. Can increase to 3 times a day as needed for constipation but hold medication if has diarrhea 01/30/18  Yes Sindy Guadeloupe, MD  vitamin B-12 (CYANOCOBALAMIN) 500 MCG tablet Take 500 mcg by mouth daily.   Yes [provider]  zinc oxide 20 % ointment Apply 1 application topically as needed for irritation.   Yes [provider]  lidocaine-prilocaine (EMLA) cream Apply 1 application topically as needed. 01/02/19   Sindy Guadeloupe, MD     ALLERGIES:  No Known Allergies   SOCIAL HISTORY:  Social History   Socioeconomic History  . Marital status: Married    Spouse name: ruth  . Number of children: Not on file  . Years of education: Not on file  .  Highest education level: Not on file  Occupational History  . Occupation: retired    Comment: Therapist, [employer]  Social Needs  . Financial resource strain: Patient refused  . Food insecurity    Worry: Patient refused    Inability: Patient refused  . Transportation needs    Medical: Patient refused    Non-medical: Patient refused  Tobacco Use  . Smoking status: Never Smoker  . Smokeless tobacco: Never Used  Substance and Sexual Activity  . Alcohol use: No    Alcohol/week: 0.0 standard drinks  . Drug use: No  . Sexual activity: Not Currently  Lifestyle  . Physical activity    Days per week: Patient refused    Minutes per session: Patient refused  . Stress: Patient refused  Relationships  . Social Herbalist on phone: Patient refused    Gets  together: Patient refused    Attends religious service: Patient refused    Active member of club or organization: Patient refused    Attends meetings of clubs or organizations: Patient refused    Relationship status: Patient refused  . Intimate partner violence    Fear of current or ex partner: Patient refused    Emotionally abused: Patient refused    Physically abused: Patient refused    Forced sexual activity: Patient refused  Other Topics Concern  . Not on file  Social History Narrative  . Not on file     FAMILY HISTORY:  Family History  Problem Relation Age of Onset  . Cancer Mother   . Chronic Renal Failure Mother   . Heart disease Father       REVIEW OF SYSTEMS:  Review of Systems  Unable to perform ROS: Other  Constitutional: Negative for chills and fever.       + Decreased PO intaked  Gastrointestinal: Positive for abdominal pain, constipation, nausea and vomiting. Negative for diarrhea.  Genitourinary:       + Bilateral PCN tubes + Chronic Foley    VITAL SIGNS:  Temp:  [97.5 F (36.4 C)-98.2 F (36.8 C)] 97.5 F (36.4 C) (07/31 0347) Pulse Rate:  [80-96] 90 (07/31 0347) Resp:  [15-23] 16 (07/31 0347) BP: (98-126)/(56-82) 125/70 (07/31 0347) SpO2:  [98 %-100 %] 99 % (07/31 0347) Weight:  [69.9 kg] 69.9 kg (07/30 1945)     Height: 5\' 9"  (175.3 cm) Weight: 69.9 kg BMI (Calculated): 22.73   INTAKE/OUTPUT:  07/30 0701 - 07/31 0700 In: 580 [I.V.:196.2; IV Piggyback:383.8] Out: -   PHYSICAL EXAM:  Physical Exam Vitals signs and nursing note reviewed.  Constitutional:      General: He is not in acute distress.    Appearance: He is well-developed. He is not ill-appearing.  HENT:     Head: Normocephalic and atraumatic.     Right Ear: Decreased hearing noted.     Left Ear: Decreased hearing noted.     Ears:     Comments: Patient very hard of hearing, communication through writing Eyes:     General: No scleral icterus.    Extraocular Movements:  Extraocular movements intact.  Cardiovascular:     Rate and Rhythm: Normal rate and regular rhythm.     Heart sounds: Normal heart sounds.  Pulmonary:     Effort: Pulmonary effort is normal. No respiratory distress.  Abdominal:     General: There is distension.     Palpations: Abdomen is soft.     Tenderness: There is no abdominal tenderness. There is no  guarding or rebound.     Comments: Abdomen is distended, tympanic to percussion, non-tender, no evidence of peritonitis   Genitourinary:    Comments: Deferred Skin:    General: Skin is warm and dry.     Coloration: Skin is not jaundiced or pale.  Neurological:     Mental Status: He is alert and oriented to person, place, and time.  Psychiatric:     Comments: Tearful      Labs:  CBC Latest Ref Rng & Units 06/07/2019 06/06/2019 05/28/2019  WBC 4.0 - 10.5 K/uL 21.4(H) 23.5(H) 19.5(H)  Hemoglobin 13.0 - 17.0 g/dL 9.1(L) 9.7(L) 9.2(L)  Hematocrit 39.0 - 52.0 % 29.0(L) 30.1(L) 29.1(L)  Platelets 150 - 400 K/uL 410(H) 547(H) 356   CMP Latest Ref Rng & Units 06/07/2019 06/06/2019 05/28/2019  Glucose 70 - 99 mg/dL 111(H) 169(H) 120(H)  BUN 8 - 23 mg/dL 22 25(H) 23  Creatinine 0.61 - 1.24 mg/dL 1.24 1.38(H) 1.39(H)  Sodium 135 - 145 mmol/L 137 134(L) 135  Potassium 3.5 - 5.1 mmol/L 3.0(L) 2.2(LL) 3.6  Chloride 98 - 111 mmol/L 104 100 100  CO2 22 - 32 mmol/L 25 22 24   Calcium 8.9 - 10.3 mg/dL 8.2(L) 8.4(L) 8.6(L)  Total Protein 6.5 - 8.1 g/dL - 6.6 7.2  Total Bilirubin 0.3 - 1.2 mg/dL - 0.7 0.4  Alkaline Phos 38 - 126 U/L - 75 57  AST 15 - 41 U/L - 11(L) 13(L)  ALT 0 - 44 U/L - 7 8     Imaging studies:   CT Abdomen/Pelvis (06/06/2019) personally reviewed showing significant large bowel obstruction, small bowel appears normal caliber, no perforation or free air, and radiologist report reviewed:  IMPRESSION: 1. Large bowel obstruction likely related to bladder tumor. Sharp transition zone in the LOWER descending colon is consistent  with malignant adhesion, direct invasion by the tumor, or mass effect from the tumor. Cecum measures up to 9 centimeters. 2. Suspect involvement of the anterior abdominal wall by bladder tumor. 3. No evidence for free intraperitoneal air. 4. Bilateral percutaneous nephrostomy catheters. 5. Hiatal hernia. 6. Coronary artery disease. 7. Previous TURP. 8. Aortic Atherosclerosis (ICD10-I70.0).   Assessment/Plan: (ICD-10's: K72.609) 84 y.o. male with a history of high-grade invasive urothelial carcinoma, currently on immunotherapy, which is likely attributing to large bowel obstruction, further complicated by multiple comorbidities.   - NGT decompression not as ideal for decompression of colonic distension, small bowel appears normal caliber.   - Given his CA history, advanced age, and deconditioned state/comorbidities, patient is very poor surgical candidate for diversion (ex: diverting ileostomy).    - Palliative care should be considered at this time  - Given the patient's clinical condition and difficulty with communication, this is certainly a patient who should be allowed to have a family member at bedside to discuss current condition and establish long term goals of care    - Appreciate urology input and recommendations  - Further management per primary service; we will follow    All of the above findings and recommendations were discussed with the medical care team. Will discuss with family as well.   Thank you for the opportunity to participate in this patient's care.   -- Edison Simon, PA-C [facility] 06/07/2019, 7:20 AM [phone] M-F: 7am - 4pm  I saw and evaluated the patient.  I agree with the above documentation, exam, and plan, which I have edited where appropriate. Fredirick Maudlin  10:45 AM

## 2019-06-07 NOTE — Progress Notes (Addendum)
° °  Briefly, Vincent Poole is an 84 year old male with locally advanced stage IV bladder cancer, urine is diverted with bilateral nephrostomy tubes.  He is a patient of Dr. Caryl Pina Poole's, but she is out of town this week.  He has a history of prostate cancer treated with radiation.  He is undergone TURBTs, chemotherapy, and is currently on palliative Tecentriq through oncology.  He was admitted last night with abdominal pain, nausea, and emesis and CT scan consistent with large bowel obstruction consistent with either malignant adhesion, direct invasion by tumor, or mass-effect from the tumor.  Cecum measures up to 9 cm.  Additionally, the tumor appears to have invaded the anterior abdominal wall.  No free air on CT.  He is afebrile, hemodynamically stable, and at baseline leukocytosis of 20k.  NG tube was attempted this morning but unable to be placed.  Abdominal exam: Distended, tympanitic, tender but non-peritonitic  I had an extensive conversation with the patient and his wife today.  The patient is extremely hard of hearing, and most of my conversation was with his wife who is his primary caregiver and decision maker.  I was very frank with them in discussing his aggressive locally advanced bladder cancer that is not curable despite all prior resections, chemotherapy, radiation, and immunotherapy.  He is not a candidate for any additional curative treatments.    We discussed his options with his current situation of a large bowel obstruction with severely dilated colon.  With a palliative approach of keeping him comfortable and pain controlled, he likely will suffer a bowel perforation and subsequent demise if managed in conservative fashion.  It would be very unlikely for this to resolve spontaneously.  The alternative would be undergoing a proximal diversion with general surgery at some point in the next 24 hours.  This would be extremely high risk, and I would rate his Intra-Op mortality as high as  50%.  Additionally, this would not be a curative surgery regarding his cancer, and would purely be to divert the stool and palliate his bowel obstruction and pain.  We discussed at length that he may still have significant pelvic pain even after surgery with his locally invasive bladder cancer.  We also discussed possible role for GI regarding a decompressive colonoscopy, however I would defer to general surgery's expertise regarding management of malignant large bowel obstruction.  Finally, we discussed his CODE STATUS at length.  At this point with his advanced disease, poor prognosis, and overall decompensation, his wife would like to pursue DNR CODE STATUS.  The patient, his wife, and their children are considering proximal bowel diversion with general surgery for management of his malignant large bowel obstruction secondary to locally advanced bladder cancer.  I think they have a good understanding of the significant risks of surgery.  I again discussed with them his poor prognosis and that he will likely have a prolonged hospitalization even if surgery goes smoothly.   Will defer to general surgery and their conversation with the family regarding risks/benefits of diversion vs hospice approach, and ultimate decision whether to pursue high risk surgery vs hospice.  We greatly appreciate general surgery input and assistance in this complex patient with advanced bladder cancer and poor prognosis.  Vincent Madrid, MD 06/07/2019

## 2019-06-07 NOTE — Consult Note (Signed)
Urology Consult  I have been asked to see the patient by Vincent Poole, for evaluation and management of invasive bladder cancer likely contributing to a large bowel obstruction.    Chief Complaint: Large bowel obstruction  History of Present Illness: Vincent Poole is a 84 y.o. year old male with bilateral ureteral obstruction due to locally advance bladder cancer who has been admitted due to a large bowel obstruction.    Vincent Poole is very hard of hearing to the point where communication is done by writing and with some dementia so most of the history is obtained from the chart.  He presented to the ED last evening for a 6 day history of intermittent abdominal pain, nausea and vomiting.  He states that his last bowel movement was 5 days ago.  There has been no reports of hematemesis, hematochezia or melena.  Contrast CT from yesterday (06/06/2019) demonstrated a large bowel obstruction likely related to bladder tumor. Sharp transition zone in the LOWER descending colon is consistent with malignant adhesion, direct invasion by the tumor, or mass effect from the tumor. Cecum measures up to 9 centimeters.  Suspect involvement of the anterior abdominal wall by bladder tumor.  No evidence for free intraperitoneal air.  Bilateral percutaneous nephrostomy catheters.  Hiatal hernia.  Coronary artery disease.  Previous TURP.  Aortic Atherosclerosis    A NG tube was attempted this morning, but it was not successful.  He is reporting no discomfort and is actually wanting something to drink and to have breakfast.    His WBC is 21.4, but it is felt that he has chronic leukocytosis due to his malignancy.  His serum creatinine is 1.24.  UA positive for > 50 RBC's and > 50 WBC's.  Blood and urine cultures are pending.    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     He was last seen by oncology on 05/28/2019 with Dr. Janese Poole and plan was to continue with Tecentriq.    His bilateral nephrostomy tubes were exchanged on 05/31/2019.  They are in place draining clear yellow urine.  Both collection bags contain 200 cc of urine.  The nephrostomy tube sites are clean and dry.    Past Medical History:  Diagnosis Date   Anxiety    Arthritis    Bladder cancer ([place])    Dysrhythmia 07/2018   history of atrial flutter that worsens with anxiety   Femur fracture, right ([place]) 05/01/2018   GERD (gastroesophageal reflux disease)    History of recent blood transfusion 05/2018   Hypertension    Iron deficiency anemia 09/14/2018   Prostate cancer ([place]) 07/2018   cancer growing in prostate but not prostate cancer, it is from the bladder   Umbilical hernia 19/1478   Urinary retention 2019   foley catheter place 11/2017   UTI (urinary tract infection) 2019   frequent UTI's over last year   Wound eschar of foot 07/2018   left heal getting wrapped and requiring antibiotic cream. cracks open with weight bearing.    Past Surgical History:  Procedure Laterality Date   CARPAL TUNNEL RELEASE Right    CHOLECYSTECTOMY  2004   CYSTOGRAM  07/18/2018   Procedure: CYSTOGRAM;  Surgeon: [name], MD;  Location: [facility];  Service: Urology;;   CYSTOSCOPY W/ RETROGRADES Bilateral  07/18/2018   Procedure: CYSTOSCOPY WITH RETROGRADE PYELOGRAM;  Surgeon: [name], MD;  Location: [facility];  Service: Urology;  Laterality: Bilateral;   CYSTOSCOPY W/ URETERAL STENT PLACEMENT Bilateral 12/27/2017   Procedure: CYSTOSCOPY WITH RETROGRADE PYELOGRAM/URETERAL STENT PLACEMENT;  Surgeon: [name], MD;  Location: [facility];  Service: Urology;  Laterality: Bilateral;   CYSTOSCOPY W/ URETERAL STENT PLACEMENT Bilateral 07/18/2018   Procedure: CYSTOSCOPY WITH STENT REPLACEMENT (exchange);  Surgeon: [name],  MD;  Location: [facility];  Service: Urology;  Laterality: Bilateral;   CYSTOSCOPY WITH STENT PLACEMENT Bilateral 11/12/2018   Procedure: [place] WITH STENT Exchange;  Surgeon: [name], MD;  Location: [facility];  Service: Urology;  Laterality: Bilateral;   INTRAMEDULLARY (IM) NAIL INTERTROCHANTERIC Right 05/02/2018   Procedure: INTRAMEDULLARY (IM) NAIL INTERTROCHANTRIC;  Surgeon: [name], MD;  Location: [facility];  Service: Orthopedics;  Laterality: Right;   IR NEPHROSTOMY EXCHANGE LEFT  03/25/2019   IR NEPHROSTOMY EXCHANGE LEFT  04/19/2019   IR NEPHROSTOMY EXCHANGE LEFT  05/31/2019   IR NEPHROSTOMY EXCHANGE RIGHT  03/25/2019   IR NEPHROSTOMY EXCHANGE RIGHT  04/19/2019   IR NEPHROSTOMY EXCHANGE RIGHT  05/31/2019   IR NEPHROSTOMY PLACEMENT LEFT  02/23/2019   IR NEPHROSTOMY PLACEMENT RIGHT  02/23/2019   LEG TENDON SURGERY Right 1958   PORTA CATH INSERTION N/A 01/22/2018   Procedure: PORTA CATH INSERTION;  Surgeon: [name], MD;  Location: [facility];  Service: Cardiovascular;  Laterality: N/A;   TRANSURETHRAL RESECTION OF BLADDER TUMOR N/A 12/27/2017   Procedure: TRANSURETHRAL RESECTION OF BLADDER TUMOR (TURBT);  Surgeon: [name], MD;  Location: [facility];  Service: Urology;  Laterality: N/A;   TRANSURETHRAL RESECTION OF BLADDER TUMOR N/A 01/15/2018   Procedure: TRANSURETHRAL RESECTION OF BLADDER TUMOR (TURBT);  Surgeon: [name], MD;  Location: [facility];  Service: Urology;  Laterality: N/A;  Need 2 hrs for this case please   TRANSURETHRAL RESECTION OF BLADDER TUMOR N/A 07/18/2018   Procedure: TRANSURETHRAL RESECTION OF BLADDER TUMOR (TURBT);  Surgeon: [name], MD;  Location: [facility];  Service: Urology;  Laterality: N/A;    Home Medications:          Current Facility-Administered Medications (Hematological):    enoxaparin (LOVENOX) injection 40 mg   Current Facility-Administered Medications (Other):    lactated ringers  infusion   ondansetron (ZOFRAN) tablet 4 mg **OR** ondansetron (ZOFRAN) injection 4 mg   piperacillin-tazobactam (ZOSYN) IVPB 3.375 g   sodium chloride flush (NS) 0.9 % injection 3 mL  No current outpatient medications on file.  Allergies: No Known Allergies  Family History  Problem Relation Age of Onset   Cancer Mother    Chronic Renal Failure Mother    Heart disease Father     Social History:  reports that he has never smoked. He has never used smokeless tobacco. He reports that he does not drink alcohol or use drugs.  ROS: A complete review of systems was performed.  All systems are negative except for pertinent findings as noted.  Physical Exam:  Vital signs in last 24 hours: Temp:  [97.5 F (36.4 C)-98.2 F (36.8 C)] 97.5 F (36.4 C) (07/31 0347) Pulse Rate:  [80-96] 90 (07/31 0347) Resp:  [15-23] 16 (07/31 0347) BP: (98-126)/(56-82) 125/70 (07/31 0347) SpO2:  [98 %-100 %] 99 % (07/31 0347) Weight:  [69.9 kg] 69.9 kg (07/30 1945) Constitutional:  Alert and oriented, No acute distress.  Very hard of hearing and he needs to communicate through written form.  HEENT: [place] AT, moist mucus membranes.  Trachea midline, no masses Cardiovascular: No cyanosis, or edema. Respiratory: Normal respiratory effort, lungs clear bilaterally GI: Abdomen is distended, firm and non tender.  Hypoactive bowel sounds.   GU: No CVA tenderness  Nephrostomy tubes in place draining clear yellow urine Uncircumcised phallus.  Foreskin easily retracted.  No Foley.   Skin: No rashes, bruises or suspicious lesions Neurologic: Grossly intact, no focal deficits, moving all 4 extremities Psychiatric: Normal mood and affect   Laboratory Data:  Recent Labs    06/06/19 2017 06/07/19 0450  WBC 23.5* 21.4*  HGB 9.7* 9.1*  HCT 30.1* 29.0*   Recent Labs    06/06/19 2017 06/07/19 0450  NA 134* 137  K 2.2* 3.0*  CL 100 104  CO2 22 25  GLUCOSE 169* 111*  BUN 25* 22  CREATININE 1.38* 1.24   CALCIUM 8.4* 8.2*   Recent Labs    06/06/19 2017  INR 1.5*   No results for input(s): LABURIN in the last 72 hours. Results for orders placed or performed during the hospital encounter of 06/06/19  Blood culture (routine x 2)     Status: None (Preliminary result)   Collection Time: 06/06/19  8:17 PM   Specimen: BLOOD  Result Value Ref Range Status   Specimen Description BLOOD BLOOD LEFT FOREARM  Final   Special Requests   Final    BOTTLES DRAWN AEROBIC AND ANAEROBIC Blood Culture results may not be optimal due to an excessive volume of blood received in culture bottles   Culture   Final    NO GROWTH < 12 HOURS Performed at [facility], [address], [place], [place] [address]    Report Status PENDING  Incomplete  Blood culture (routine x 2)     Status: None (Preliminary result)   Collection Time: 06/06/19  9:54 PM   Specimen: BLOOD  Result Value Ref Range Status   Specimen Description BLOOD RIGHT ANTECUBITAL  Final   Special Requests   Final    BOTTLES DRAWN AEROBIC AND ANAEROBIC Blood Culture results may not be optimal due to an excessive volume of blood received in culture bottles   Culture   Final    NO GROWTH < 12 HOURS Performed at [facility], [address] [place], [place] [address]    Report Status PENDING  Incomplete  SARS Coronavirus 2 (CEPHEID - Performed in [place] hospital lab), Hosp Order     Status: None   Collection Time: 06/06/19  9:54 PM   Specimen: Nasopharyngeal Swab  Result Value Ref Range Status   SARS Coronavirus 2 NEGATIVE NEGATIVE Final    Comment: (NOTE) If result is NEGATIVE SARS-CoV-2 target nucleic acids are NOT DETECTED. The SARS-CoV-2 RNA is generally detectable in upper and lower  respiratory specimens during the acute phase of infection. The lowest  concentration of SARS-CoV-2 viral copies this assay can detect is 250  copies / mL. A negative result does not preclude SARS-CoV-2 infection  and should not be  used as the sole basis for treatment or other  patient management decisions.  A negative result may occur with  improper specimen collection / handling, submission of specimen other  than nasopharyngeal swab, presence of viral mutation(s) within the  areas targeted by this assay, and inadequate number of viral copies  (<250 copies / mL). A negative result must be combined with clinical  observations, patient history, and epidemiological information. If result is POSITIVE SARS-CoV-2 target nucleic acids  are DETECTED. The SARS-CoV-2 RNA is generally detectable in upper and lower  respiratory specimens dur ing the acute phase of infection.  Positive  results are indicative of active infection with SARS-CoV-2.  Clinical  correlation with patient history and other diagnostic information is  necessary to determine patient infection status.  Positive results do  not rule out bacterial infection or co-infection with other viruses. If result is PRESUMPTIVE POSTIVE SARS-CoV-2 nucleic acids MAY BE PRESENT.   A presumptive positive result was obtained on the submitted specimen  and confirmed on repeat testing.  While 2019 novel coronavirus  (SARS-CoV-2) nucleic acids may be present in the submitted sample  additional confirmatory testing may be necessary for epidemiological  and / or clinical management purposes  to differentiate between  SARS-CoV-2 and other Sarbecovirus currently known to infect humans.  If clinically indicated additional testing with an alternate test  methodology [phone]) is advised. The SARS-CoV-2 RNA is generally  detectable in upper and lower respiratory sp ecimens during the acute  phase of infection. The expected result is Negative. Fact Sheet for Patients:  StrictlyIdeas.no Fact Sheet for Healthcare Providers: BankingDealers.co.za This test is not yet approved or cleared by the [place] FDA and has been authorized  for detection and/or diagnosis of SARS-CoV-2 by FDA under an Emergency Use Authorization (EUA).  This EUA will remain in effect (meaning this test can be used) for the duration of the COVID-19 declaration under Section 564(b)(1) of the Act, 21 U.S.C. section 360bbb-3(b)(1), unless the authorization is terminated or revoked sooner. Performed at [facility], [address], [place], [place] [address]      Radiologic Imaging: Ct Abdomen Pelvis W Contrast  Result Date: 06/06/2019 CLINICAL DATA:  Patient c/o abdominal pain, emesis, constipation, weakness. Patient is a cancer patient with weekly treatments in the cancer center (unsure if chemo). Patient has bilateral nephrostomy tubes. EXAM: CT ABDOMEN AND PELVIS WITH CONTRAST TECHNIQUE: Multidetector CT imaging of the abdomen and pelvis was performed using the standard protocol following bolus administration of intravenous contrast. CONTRAST:  190mL OMNIPAQUE IOHEXOL 300 MG/ML  SOLN COMPARISON:  CT of the abdomen and pelvis on 04/16/2019, 02/21/2019 FINDINGS: Lower chest: Extensive coronary artery calcification. Pericardial thickening or small effusion. Small hiatal hernia. Hepatobiliary: Cholecystectomy. Liver is otherwise unremarkable. Pancreas: Atrophic pancreas normal in appearance. Spleen: Normal in size without focal abnormality. Adrenals/Urinary Tract: Adrenal glands are normal. Bilateral percutaneous nephrostomy catheters. Stable RIGHT renal cyst. Ureters are decompressed. Irregular soft tissue thickening of the urinary bladder wall, associated with surrounding inflammatory changes. There is a broad interface with the anterior abdominal wall, suspicious for involvement of the anterior abdominal wall. Stomach/Bowel: Hiatal hernia. Stomach is unremarkable. Small bowel loops are not dilated. The appendix is distended and contains appendicoliths. Appendix measures 13 millimeters in thickness. There is new, significant dilatation of the  colon. Cecum measures 9.0 centimeters. The colon is dilated and stool filled throughout its course to level of the descending colon where there is a beak adjacent to the bladder mass, best seen on image 32/5. This abrupt transition zone is consistent with malignant adhesion or mass effects and causing significant obstruction. A second possible narrowing is also identified within the sigmoid colon best seen on coronal image 19/5. There is no evidence for free intraperitoneal air. Vascular/Lymphatic: Dense atherosclerotic calcifications of the abdominal aorta. No aneurysm. Reproductive: Prostatic calcifications. Previous TURP. Other: No ascites. Small amount of fluid within the RIGHT inguinal canal. Musculoskeletal: Degenerative changes are seen spine and hips, RIGHT  greater than LEFT. Previous ORIF of the RIGHT hip. Significant degenerative changes and postoperative IMPRESSION: 1. Large bowel obstruction likely related to bladder tumor. Sharp transition zone in the LOWER descending colon is consistent with malignant adhesion, direct invasion by the tumor, or mass effect from the tumor. Cecum measures up to 9 centimeters. 2. Suspect involvement of the anterior abdominal wall by bladder tumor. 3. No evidence for free intraperitoneal air. 4. Bilateral percutaneous nephrostomy catheters. 5. Hiatal hernia. 6. Coronary artery disease. 7. Previous TURP. 8. Aortic Atherosclerosis (ICD10-I70.0). These results were called by telephone at the time of interpretation on 06/06/2019 at 8:58 pm to Dr. Marjean Donna , who verbally acknowledged these results. Electronically Signed   By: Nolon Nations M.D.   On: 06/06/2019 21:00    Impression/Assessment:  Mr. Reiswig is an 84 year old male with with locally advanced muscle invasive urothelial carcinoma stage III aT4 N0 M0 s/p 5 cycles of gemcitabine and carboplatin with residual/recurrent tumor currently on palliative Tecentriq who is currently admitted for a large bowel obstruction  likely resulting from invasion of bladder cancer into the lower descending colon.    Plan:  - would defer consideration of a proximal diversion to general surgery  - another attempt to place a NG tube to be done this morning  - nephrostomy tubes in place and draining clear yellow urine with good output recently exchanged on 05/31/2019, next exchange in 30 days - blood and urine cultures are pending -  Continue broad spectrum antibiotics  - leukocytosis is chronic and likely cancer related - due to advanced age and advanced disease he is not an urological surgical candidate sorecommend palliative consult at this time  - urology to continue to follow    - 06/07/2019, 8:15 AM  Ayris Carano [facility]

## 2019-06-08 ENCOUNTER — Inpatient Hospital Stay: Payer: Medicare Other | Admitting: Anesthesiology

## 2019-06-08 ENCOUNTER — Encounter
Admission: EM | Disposition: A | Discharge status: Skilled Nursing Facility | Payer: Medicare Other | Source: Home / Self Care | Attending: Internal Medicine

## 2019-06-08 HISTORY — PX: LAPAROTOMY: SHX154

## 2019-06-08 HISTORY — PX: COLOSTOMY: SHX63

## 2019-06-08 LAB — CBC
HCT: 27 % — ABNORMAL LOW (ref 39.0–52.0)
HCT: 26.6 % — ABNORMAL LOW (ref 39.0–52.0)
Hemoglobin: 8.5 g/dL — ABNORMAL LOW (ref 13.0–17.0)
Hemoglobin: 8.2 g/dL — ABNORMAL LOW (ref 13.0–17.0)
MCH: 28.9 pg (ref 26.0–34.0)
MCH: 28.2 pg (ref 26.0–34.0)
MCHC: 31.5 g/dL (ref 30.0–36.0)
MCHC: 30.8 g/dL (ref 30.0–36.0)
MCV: 91.8 fL (ref 80.0–100.0)
MCV: 91.4 fL (ref 80.0–100.0)
Platelets: 353 10*3/uL (ref 150–400)
Platelets: 324 10*3/uL (ref 150–400)
RBC: 2.94 MIL/uL — ABNORMAL LOW (ref 4.22–5.81)
RBC: 2.91 MIL/uL — ABNORMAL LOW (ref 4.22–5.81)
RDW: 15.2 % (ref 11.5–15.5)
RDW: 15.3 % (ref 11.5–15.5)
WBC: 22.9 10*3/uL — ABNORMAL HIGH (ref 4.0–10.5)
WBC: 23.5 10*3/uL — ABNORMAL HIGH (ref 4.0–10.5)
nRBC: 0 % (ref 0.0–0.2)
nRBC: 0 % (ref 0.0–0.2)

## 2019-06-08 LAB — BASIC METABOLIC PANEL
Anion gap: 10 (ref 5–15)
BUN: 20 mg/dL (ref 8–23)
CO2: 21 mmol/L — ABNORMAL LOW (ref 22–32)
Calcium: 8.2 mg/dL — ABNORMAL LOW (ref 8.9–10.3)
Chloride: 106 mmol/L (ref 98–111)
Creatinine, Ser: 1.15 mg/dL (ref 0.61–1.24)
GFR calc Af Amer: >60 mL/min (ref >60)
GFR calc non Af Amer: 59 mL/min — ABNORMAL LOW (ref >60)
Glucose, Bld: 110 mg/dL — ABNORMAL HIGH (ref 70–99)
Potassium: 4.3 mmol/L (ref 3.5–5.1)
Sodium: 137 mmol/L (ref 135–145)

## 2019-06-08 LAB — GLUCOSE, CAPILLARY: Glucose-Capillary: 101 mg/dL — ABNORMAL HIGH (ref 70–99)

## 2019-06-08 LAB — URINE CULTURE: Special Requests: NORMAL

## 2019-06-08 LAB — MAGNESIUM: Magnesium: 2.2 mg/dL (ref 1.7–2.4)

## 2019-06-08 LAB — PROTIME-INR
INR: 1.3 — ABNORMAL HIGH (ref 0.8–1.2)
Prothrombin Time: 15.8 s — ABNORMAL HIGH (ref 11.4–15.2)

## 2019-06-08 LAB — PROCALCITONIN: Procalcitonin: 0.27 ng/mL

## 2019-06-08 LAB — MRSA PCR SCREENING: MRSA by PCR: NEGATIVE

## 2019-06-08 SURGERY — LAPAROTOMY, EXPLORATORY
Anesthesia: General | Site: Abdomen

## 2019-06-08 MED ORDER — PROPOFOL 10 MG/ML IV BOLUS
INTRAVENOUS | Status: DC | PRN
  Administered 2019-06-08: 130 mg via INTRAVENOUS

## 2019-06-08 MED ORDER — SODIUM CHLORIDE 0.9 % IV BOLUS
1000.0000 mL | Freq: Once | INTRAVENOUS | Status: AC
  Administered 2019-06-08: 1000 mL via INTRAVENOUS

## 2019-06-08 MED ORDER — SUCCINYLCHOLINE CHLORIDE 20 MG/ML IJ SOLN
INTRAMUSCULAR | Status: DC | PRN
  Administered 2019-06-08: 120 mg via INTRAVENOUS

## 2019-06-08 MED ORDER — SODIUM CHLORIDE (PF) 0.9 % IJ SOLN
INTRAMUSCULAR | Status: AC
  Filled 2019-06-08: qty 10

## 2019-06-08 MED ORDER — LACTATED RINGERS IV SOLN
INTRAVENOUS | Status: DC | PRN
  Administered 2019-06-08: 01:00:00 via INTRAVENOUS

## 2019-06-08 MED ORDER — SUGAMMADEX SODIUM 200 MG/2ML IV SOLN
INTRAVENOUS | Status: DC | PRN
  Administered 2019-06-08: 200 mg via INTRAVENOUS

## 2019-06-08 MED ORDER — SODIUM CHLORIDE FLUSH 0.9 % IV SOLN
INTRAVENOUS | Status: AC
  Filled 2019-06-08: qty 10

## 2019-06-08 MED ORDER — ROCURONIUM BROMIDE 100 MG/10ML IV SOLN
INTRAVENOUS | Status: DC | PRN
  Administered 2019-06-08: 20 mg via INTRAVENOUS
  Administered 2019-06-08: 50 mg via INTRAVENOUS

## 2019-06-08 MED ORDER — FENTANYL CITRATE (PF) 100 MCG/2ML IJ SOLN
25.0000 ug | INTRAMUSCULAR | Status: DC | PRN
  Administered 2019-06-08 (×2): 25 ug via INTRAVENOUS

## 2019-06-08 MED ORDER — ONDANSETRON HCL 4 MG/2ML IJ SOLN
INTRAMUSCULAR | Status: DC | PRN
  Administered 2019-06-08: 4 mg via INTRAVENOUS

## 2019-06-08 MED ORDER — BUPIVACAINE LIPOSOME 1.3 % IJ SUSP
INTRAMUSCULAR | Status: DC | PRN
  Administered 2019-06-08: 50 mL

## 2019-06-08 MED ORDER — ACETAMINOPHEN 10 MG/ML IV SOLN
INTRAVENOUS | Status: DC | PRN
  Administered 2019-06-08: 1000 mg via INTRAVENOUS

## 2019-06-08 MED ORDER — POTASSIUM CHLORIDE 10 MEQ/100ML IV SOLN
10.0000 meq | INTRAVENOUS | Status: AC
  Administered 2019-06-08 (×4): 10 meq via INTRAVENOUS
  Filled 2019-06-08 (×4): qty 100

## 2019-06-08 MED ORDER — CEFAZOLIN SODIUM-DEXTROSE 1-4 GM/50ML-% IV SOLN
INTRAVENOUS | Status: DC | PRN
  Administered 2019-06-08: 1 g via INTRAVENOUS

## 2019-06-08 MED ORDER — DEXAMETHASONE SODIUM PHOSPHATE 10 MG/ML IJ SOLN
INTRAMUSCULAR | Status: DC | PRN
  Administered 2019-06-08: 5 mg via INTRAVENOUS

## 2019-06-08 MED ORDER — PANTOPRAZOLE SODIUM 40 MG IV SOLR
40.0000 mg | INTRAVENOUS | Status: DC
  Administered 2019-06-08 – 2019-06-12 (×5): 40 mg via INTRAVENOUS
  Filled 2019-06-08 (×5): qty 40

## 2019-06-08 MED ORDER — DEXTROSE-NACL 5-0.45 % IV SOLN
INTRAVENOUS | Status: DC
  Administered 2019-06-08 – 2019-06-09 (×2): via INTRAVENOUS

## 2019-06-08 MED ORDER — LIDOCAINE HCL (CARDIAC) PF 100 MG/5ML IV SOSY
PREFILLED_SYRINGE | INTRAVENOUS | Status: DC | PRN
  Administered 2019-06-08: 100 mg via INTRAVENOUS

## 2019-06-08 MED ORDER — MAGNESIUM SULFATE 2 GM/50ML IV SOLN
2.0000 g | INTRAVENOUS | Status: AC
  Administered 2019-06-08: 2 g via INTRAVENOUS
  Filled 2019-06-08: qty 50

## 2019-06-08 MED ORDER — FENTANYL CITRATE (PF) 100 MCG/2ML IJ SOLN
INTRAMUSCULAR | Status: AC
  Administered 2019-06-08: 04:00:00 25 ug via INTRAVENOUS
  Filled 2019-06-08: qty 2

## 2019-06-08 MED ORDER — CEFAZOLIN SODIUM 1 G IJ SOLR
INTRAMUSCULAR | Status: AC
  Filled 2019-06-08: qty 10

## 2019-06-08 MED ORDER — CHLORHEXIDINE GLUCONATE CLOTH 2 % EX PADS
6.0000 | MEDICATED_PAD | Freq: Every day | CUTANEOUS | Status: DC
  Administered 2019-06-09 – 2019-06-12 (×4): 6 via TOPICAL

## 2019-06-08 MED ORDER — ONDANSETRON HCL 4 MG/2ML IJ SOLN: 4.0000 mg | Freq: Once | INTRAMUSCULAR | Status: DC | PRN

## 2019-06-08 MED ORDER — ACETAMINOPHEN 10 MG/ML IV SOLN
INTRAVENOUS | Status: AC
  Filled 2019-06-08: qty 100

## 2019-06-08 MED ORDER — FENTANYL CITRATE (PF) 100 MCG/2ML IJ SOLN
INTRAMUSCULAR | Status: DC | PRN
  Administered 2019-06-08: 100 ug via INTRAVENOUS

## 2019-06-08 SURGICAL SUPPLY — 77 items
APPLIER CLIP ROT 10 11.4 M/L (STAPLE)
BAG COUNTER SPONGE EZ (MISCELLANEOUS) IMPLANT
BARRIER SKIN 2 3/4 (OSTOMY) ×2 IMPLANT
BARRIER SKIN 2 3/4 INCH (OSTOMY) ×1
BUR VERTEX HOODED 4.5 (BURR) ×3 IMPLANT
CANISTER SUCT 1200ML W/VALVE (MISCELLANEOUS) ×3 IMPLANT
CANNULA DILATOR 10 W/SLV (CANNULA) IMPLANT
CANNULA DILATOR 10MM W/SLV (CANNULA)
CATH SCT 14FR 2 3ANG EYE CNTRL (CATHETERS) ×1 IMPLANT
CATH SUCT 14FR (CATHETERS) ×2
CHLORAPREP W/TINT 26 (MISCELLANEOUS) ×3 IMPLANT
CLIP APPLIE ROT 10 11.4 M/L (STAPLE) IMPLANT
CLOSURE WOUND 1/2 X4 (GAUZE/BANDAGES/DRESSINGS)
COUNTER SPONGE BAG EZ (MISCELLANEOUS)
COVER CLAMP SIL LG PBX B (MISCELLANEOUS) ×3 IMPLANT
COVER WAND RF STERILE (DRAPES) IMPLANT
DECANTER SPIKE VIAL GLASS SM (MISCELLANEOUS) ×3 IMPLANT
DRAPE LAPAROTOMY 100X77 ABD (DRAPES) ×3 IMPLANT
DRSG OPSITE POSTOP 4X10 (GAUZE/BANDAGES/DRESSINGS) IMPLANT
DRSG OPSITE POSTOP 4X8 (GAUZE/BANDAGES/DRESSINGS) IMPLANT
DRSG TEGADERM 2-3/8X2-3/4 SM (GAUZE/BANDAGES/DRESSINGS) IMPLANT
DRSG TEGADERM 4X10 (GAUZE/BANDAGES/DRESSINGS) IMPLANT
DRSG TEGADERM 4X4.75 (GAUZE/BANDAGES/DRESSINGS) ×6 IMPLANT
DRSG TELFA 3X8 NADH (GAUZE/BANDAGES/DRESSINGS) IMPLANT
ELECT BLADE 6 FLAT ULTRCLN (ELECTRODE) ×3 IMPLANT
ELECT REM PT RETURN 9FT ADLT (ELECTROSURGICAL) ×3
ELECTRODE REM PT RTRN 9FT ADLT (ELECTROSURGICAL) ×1 IMPLANT
GAUZE 4X4 16PLY RFD (DISPOSABLE) ×3 IMPLANT
GLOVE BIO SURGEON STRL SZ 6.5 (GLOVE) ×4 IMPLANT
GLOVE BIO SURGEONS STRL SZ 6.5 (GLOVE) ×2
GLOVE BIOGEL PI IND STRL 6.5 (GLOVE) ×2 IMPLANT
GLOVE BIOGEL PI INDICATOR 6.5 (GLOVE) ×4
GLOVE INDICATOR 6.5 STRL GRN (GLOVE) ×9 IMPLANT
GOWN STRL REUS W/ TWL LRG LVL3 (GOWN DISPOSABLE) ×2 IMPLANT
GOWN STRL REUS W/TWL LRG LVL3 (GOWN DISPOSABLE) ×4
KIT TURNOVER KIT A (KITS) ×3 IMPLANT
LABEL OR SOLS (LABEL) ×3 IMPLANT
NEEDLE HYPO 22GX1.5 SAFETY (NEEDLE) ×3 IMPLANT
NS IRRIG 1000ML POUR BTL (IV SOLUTION) ×3 IMPLANT
NS IRRIG 500ML POUR BTL (IV SOLUTION) IMPLANT
PACK BASIN MAJOR ARMC (MISCELLANEOUS) ×3 IMPLANT
PACK BASIN MINOR ARMC (MISCELLANEOUS) ×3 IMPLANT
PACK COLON CLEAN CLOSURE (MISCELLANEOUS) IMPLANT
PACK LAP CHOLECYSTECTOMY (MISCELLANEOUS) IMPLANT
PENCIL ELECTRO HAND CTR (MISCELLANEOUS) ×3 IMPLANT
SCALPEL PROTECTED #11 DISP (BLADE) IMPLANT
SEAL FOR SCOPE WARMER C3101 (MISCELLANEOUS) IMPLANT
SET TUBE SMOKE EVAC HIGH FLOW (TUBING) IMPLANT
SET YANKAUER POOLE SUCT (MISCELLANEOUS) IMPLANT
SHEARS HARMONIC 9CM CVD (BLADE) IMPLANT
SHEARS HARMONIC ACE PLUS 36CM (ENDOMECHANICALS) IMPLANT
SOL PREP PVP 2OZ (MISCELLANEOUS)
SOLUTION PREP PVP 2OZ (MISCELLANEOUS) IMPLANT
SPONGE LAP 18X18 RF (DISPOSABLE) IMPLANT
STAPLE ECHEON FLEX 60 POW ENDO (STAPLE) IMPLANT
STAPLER SKIN PROX 35W (STAPLE) ×3 IMPLANT
STRIP CLOSURE SKIN 1/2X4 (GAUZE/BANDAGES/DRESSINGS) IMPLANT
SUT PDS AB 1 TP1 54 (SUTURE) ×3 IMPLANT
SUT PROLENE 0 CT 1 30 (SUTURE) ×3 IMPLANT
SUT SILK 2 0 (SUTURE)
SUT SILK 2-0 18XBRD TIE 12 (SUTURE) IMPLANT
SUT SILK 3 0 (SUTURE) ×2
SUT SILK 3-0 18XBRD TIE 12 (SUTURE) ×1 IMPLANT
SUT VIC AB 2-0 BRD 54 (SUTURE) IMPLANT
SUT VIC AB 3-0 SH 27 (SUTURE)
SUT VIC AB 3-0 SH 27X BRD (SUTURE) IMPLANT
SUT VIC AB 4-0 FS2 27 (SUTURE) ×3 IMPLANT
SUT VICRYL 0 AB UR-6 (SUTURE) ×6 IMPLANT
SUT VICRYL 3-0 CR8 SH (SUTURE) ×3 IMPLANT
SUT VICRYL 3-0 RB1 18 ABS (SUTURE) ×3 IMPLANT
SUT VICRYL+ 3-0 144IN (SUTURE) ×3 IMPLANT
SWABSTK COMLB BENZOIN TINCTURE (MISCELLANEOUS) IMPLANT
SYR 30ML LL (SYRINGE) ×3 IMPLANT
TRAY FOLEY MTR SLVR 16FR STAT (SET/KITS/TRAYS/PACK) IMPLANT
TROCAR XCEL 12X100 BLDLESS (ENDOMECHANICALS) IMPLANT
TROCAR XCEL NON-BLD 11X100MML (ENDOMECHANICALS) IMPLANT
TUBING EVAC SMOKE HEATED PNEUM (TUBING) IMPLANT

## 2019-06-08 NOTE — Anesthesia Post-op Follow-up Note (Signed)
Anesthesia QCDR form completed.        

## 2019-06-08 NOTE — Progress Notes (Signed)
Pharmacy Antibiotic Note  Vincent Poole is a 84 y.o. male admitted on 06/06/2019 with UTI.  Pharmacy has been consulted for zosyn dosing. Pt has large bowel obstruction status post transverse loop colostomy POD 1, complicated by pertinent comorbidities includingbladder cancer with metastases that has not responded to immunotherapy, hypertension, hydronephrosis, severe weakness.  Plan: Day 3 of abx Zosyn 3.375g IV q8h (4 hour infusion).   Patient grew Pseudomonas >100,000 colonies and citrobacter 80,000 colonies in urine cx on 04/26/19 w/ the citrobacter only resistant to cefazolin.  7/30 Ucx + multiple species present - suggest recollection.  7/30 Bcx remain negative.   Height: 5\' 9"  (175.3 cm) Weight: 171 lb 15.3 oz (78 kg) IBW/kg (Calculated) : 70.7  Temp (24hrs), Avg:97.7 F (36.5 C), Min:96.3 F (35.7 C), Max:99 F (37.2 C)  Recent Labs  Lab 06/06/19 2017 06/07/19 0450 06/07/19 2334 06/08/19 0513  WBC 23.5* 21.4*  --  22.9*  CREATININE 1.38* 1.24 1.16 1.15  LATICACIDVEN 1.6  --   --   --     Estimated Creatinine Clearance: 49.5 mL/min (by C-G formula based on SCr of 1.15 mg/dL).    No Known Allergies  Thank you for allowing pharmacy to be a part of this patient's care.  Eleonore Chiquito, PharmD, BCPS Clinical Pharmacist 06/08/2019 11:46 AM

## 2019-06-08 NOTE — Anesthesia Postprocedure Evaluation (Signed)
Anesthesia Post Note  Patient: Vincent Poole  Procedure(s) Performed: EXPLORATORY LAPAROTOMY (N/A Abdomen) COLOSTOMY (N/A Abdomen)  Patient location during evaluation: PACU Anesthesia Type: General Level of consciousness: awake and alert Pain management: pain level controlled Vital Signs Assessment: post-procedure vital signs reviewed and stable Respiratory status: spontaneous breathing and respiratory function stable Cardiovascular status: stable Anesthetic complications: no     Last Vitals:  Vitals:   06/08/19 0253 06/08/19 0308  BP: (!) 119/56 120/64  Pulse: 86 88  Resp: 16 20  Temp: 36.6 C   SpO2: 98% 95%    Last Pain:  Vitals:   06/08/19 0308  TempSrc:   PainSc: Asleep                 Cristofher Livecchi K

## 2019-06-08 NOTE — Transfer of Care (Signed)
Immediate Anesthesia Transfer of Care Note  Patient: Vincent Poole  Procedure(s) Performed: EXPLORATORY LAPAROTOMY (N/A Abdomen) COLOSTOMY (N/A Abdomen)  Patient Location: PACU  Anesthesia Type:General  Level of Consciousness: awake, drowsy and patient cooperative  Airway & Oxygen Therapy: Patient Spontanous Breathing  Post-op Assessment: Report given to RN and Post -op Vital signs reviewed and stable  Post vital signs: Reviewed and stable  Last Vitals:  Vitals Value Taken Time  BP 119/56 06/08/19 0253  Temp 36.6 C 06/08/19 0253  Pulse 87 06/08/19 0259  Resp 19 06/08/19 0259  SpO2 94 % 06/08/19 0259  Vitals shown include unvalidated device data.  Last Pain:  Vitals:   06/08/19 0253  TempSrc:   PainSc: Asleep         Complications: No apparent anesthesia complications

## 2019-06-08 NOTE — Anesthesia Procedure Notes (Signed)
Procedure Name: Intubation Date/Time: 06/08/2019 12:44 AM Performed by: Lowry Bowl, CRNA Pre-anesthesia Checklist: Patient identified, Emergency Drugs available, Suction available and Patient being monitored Patient Re-evaluated:Patient Re-evaluated prior to induction Oxygen Delivery Method: Circle system utilized Preoxygenation: Pre-oxygenation with 100% oxygen Induction Type: IV induction, Cricoid Pressure applied and Rapid sequence Laryngoscope Size: Mac and 4 Grade View: Grade I Tube type: Oral Tube size: 7.5 mm Number of attempts: 1 Airway Equipment and Method: Stylet Placement Confirmation: ETT inserted through vocal cords under direct vision,  positive ETCO2 and breath sounds checked- equal and bilateral Secured at: 22 cm Tube secured with: Tape Dental Injury: Teeth and Oropharynx as per pre-operative assessment  Comments: RSI - mask ventilation not attempted

## 2019-06-08 NOTE — Progress Notes (Signed)
[place] Hospital Day(s): 2.   Post op day(s): Day of Surgery.   Interval History: Patient seen and examined, no acute events or new complaints overnight. Patient reports feeling well.  He reports having pain 5 out of 10 but does not want any pain medication at this moment.  Denies nausea or vomiting.  There is no alleviating or aggravating factor.  Vital signs in last 24 hours: [min-max] current  Temp:  [96.3 F (35.7 C)-99 F (37.2 C)] 96.3 F (35.7 C) (08/01 0800) Pulse Rate:  [76-97] 79 (08/01 0900) Resp:  [12-20] 17 (08/01 0900) BP: (101-125)/(51-68) 103/51 (08/01 0900) SpO2:  [95 %-99 %] 99 % (08/01 0900) Weight:  [78 kg] 78 kg (08/01 0432)     Height: 5\' 9"  (175.3 cm) Weight: 78 kg BMI (Calculated): 25.38   NGT: Not charted  Physical Exam:  Constitutional: alert, cooperative and no distress  Respiratory: breathing non-labored at rest  Cardiovascular: regular rate and sinus rhythm  Gastrointestinal: soft, non-tender, and non-distended.  Colostomy pink and patent with expected edema.  Labs:  CBC Latest Ref Rng & Units 06/08/2019 06/07/2019 06/06/2019  WBC 4.0 - 10.5 K/uL 22.9(H) 21.4(H) 23.5(H)  Hemoglobin 13.0 - 17.0 g/dL 8.5(L) 9.1(L) 9.7(L)  Hematocrit 39.0 - 52.0 % 27.0(L) 29.0(L) 30.1(L)  Platelets 150 - 400 K/uL 353 410(H) 547(H)   CMP Latest Ref Rng & Units 06/08/2019 06/07/2019 06/07/2019  Glucose 70 - 99 mg/dL 110(H) 88 111(H)  BUN 8 - 23 mg/dL 20 20 22   Creatinine 0.61 - 1.24 mg/dL 1.15 1.16 1.24  Sodium 135 - 145 mmol/L 137 138 137  Potassium 3.5 - 5.1 mmol/L 4.3 2.9(L) 3.0(L)  Chloride 98 - 111 mmol/L 106 105 104  CO2 22 - 32 mmol/L 21(L) 24 25  Calcium 8.9 - 10.3 mg/dL 8.2(L) 8.3(L) 8.2(L)  Total Protein 6.5 - 8.1 g/dL - - -  Total Bilirubin 0.3 - 1.2 mg/dL - - -  Alkaline Phos 38 - 126 U/L - - -  AST 15 - 41 U/L - - -  ALT 0 - 44 U/L - - -    Imaging studies: No new pertinent imaging studies   Assessment/Plan:  84 y.o. male with  large bowel obstruction status post transverse loop colostomy POD 1, complicated by pertinent comorbidities including bladder cancer with metastases that has not responded to immunotherapy, hypertension, hydronephrosis, severe weakness. Patient tolerated procedure well.  He has been stable hemodynamically.  Still without bowel function.  This is unexpected postop ileus.  The colostomy looks pink and patent.  Patient has white blood cell count at baseline as evaluated from the last month.  He also have stable hemoglobin.  There is no significant electrolytes.  Improved potassium after replacing during surgery.  Also with good magnesium.  From surgery standpoint we will continue with low intermittent suction of NGT until we have bowel function.  Postoperative ileus should be expected.  He will continue medical management by primary team.  From a standpoint he can be transferred to regular floor.  Family were updated after surgery about findings.  We will follow along to help with colostomy management.  Arnold Long, MD

## 2019-06-08 NOTE — Progress Notes (Signed)
[facility] [place] NAME: Vincent Poole    MR#:  [id number]  DATE OF BIRTH:  [birth year]  SUBJECTIVE:  CHIEF COMPLAINT:   Chief Complaint  Patient presents with  . Emesis  . Abdominal Pain  . Constipation   Very hard of hearing.  Had to write on paper to communicate.  Pain is not bad. NGT in place  REVIEW OF SYSTEMS:    Review of Systems  Constitutional: Positive for malaise/fatigue. Negative for chills and fever.  HENT: Negative for sore throat.   Eyes: Negative for blurred vision, double vision and pain.  Respiratory: Negative for cough, hemoptysis, shortness of breath and wheezing.   Cardiovascular: Negative for chest pain, palpitations, orthopnea and leg swelling.  Gastrointestinal: Positive for abdominal pain. Negative for constipation, diarrhea, heartburn, nausea and vomiting.  Genitourinary: Negative for dysuria and hematuria.  Musculoskeletal: Negative for back pain and joint pain.  Skin: Negative for rash.  Neurological: Negative for sensory change, speech change, focal weakness and headaches.  Endo/Heme/Allergies: Does not bruise/bleed easily.  Psychiatric/Behavioral: Negative for depression. The patient is not nervous/anxious.     DRUG ALLERGIES:  No Known Allergies  VITALS:  Blood pressure 106/60, pulse 77, temperature (!) 96.3 F (35.7 C), temperature source Axillary, resp. rate 19, height 5\' 9"  (1.753 m), weight 78 kg, SpO2 97 %.  PHYSICAL EXAMINATION:   Physical Exam  GENERAL:  84 y.o.-year-old patient lying in the bed with no acute distress. NGT EYES: Pupils equal, round, reactive to light and accommodation. No scleral icterus. Extraocular muscles intact.  HEENT: Head atraumatic, normocephalic. Oropharynx and nasopharynx clear.  NECK:  Supple, no jugular venous distention. No thyroid enlargement, no tenderness.  LUNGS: Normal breath sounds bilaterally, no wheezing, rales, rhonchi. No use of accessory muscles  of respiration.  CARDIOVASCULAR: S1, S2 normal. No murmurs, rubs, or gallops.  ABDOMEN: Soft, colostomy bag in place EXTREMITIES: No cyanosis, clubbing or edema b/l.    NEUROLOGIC: Cranial nerves II through XII are intact. No focal Motor or sensory deficits b/l.   PSYCHIATRIC: The patient is alert and oriented x 3.  SKIN: No obvious rash, lesion, or ulcer.   LABORATORY PANEL:   CBC Recent Labs  Lab 06/08/19 0513  WBC 22.9*  HGB 8.5*  HCT 27.0*  PLT 353   ------------------------------------------------------------------------------------------------------------------ Chemistries  Recent Labs  Lab 06/06/19 2017  06/08/19 0513  NA 134*   < > 137  K 2.2*   < > 4.3  CL 100   < > 106  CO2 22   < > 21*  GLUCOSE 169*   < > 110*  BUN 25*   < > 20  CREATININE 1.38*   < > 1.15  CALCIUM 8.4*   < > 8.2*  MG  --   --  2.2  AST 11*  --   --   ALT 7  --   --   ALKPHOS 75  --   --   BILITOT 0.7  --   --    < > = values in this interval not displayed.   ------------------------------------------------------------------------------------------------------------------  Cardiac Enzymes No results for input(s): TROPONINI in the last 168 hours. ------------------------------------------------------------------------------------------------------------------  RADIOLOGY:  Ct Abdomen Pelvis W Contrast  Result Date: 06/06/2019 CLINICAL DATA:  Patient c/o abdominal pain, emesis, constipation, weakness. Patient is a cancer patient with weekly treatments in the cancer center (unsure if chemo). Patient has bilateral nephrostomy tubes. EXAM: CT ABDOMEN AND PELVIS WITH CONTRAST TECHNIQUE: Multidetector  CT imaging of the abdomen and pelvis was performed using the standard protocol following bolus administration of intravenous contrast. CONTRAST:  161mL OMNIPAQUE IOHEXOL 300 MG/ML  SOLN COMPARISON:  CT of the abdomen and pelvis on 04/16/2019, 02/21/2019 FINDINGS: Lower chest: Extensive coronary artery  calcification. Pericardial thickening or small effusion. Small hiatal hernia. Hepatobiliary: Cholecystectomy. Liver is otherwise unremarkable. Pancreas: Atrophic pancreas normal in appearance. Spleen: Normal in size without focal abnormality. Adrenals/Urinary Tract: Adrenal glands are normal. Bilateral percutaneous nephrostomy catheters. Stable RIGHT renal cyst. Ureters are decompressed. Irregular soft tissue thickening of the urinary bladder wall, associated with surrounding inflammatory changes. There is a broad interface with the anterior abdominal wall, suspicious for involvement of the anterior abdominal wall. Stomach/Bowel: Hiatal hernia. Stomach is unremarkable. Small bowel loops are not dilated. The appendix is distended and contains appendicoliths. Appendix measures 13 millimeters in thickness. There is new, significant dilatation of the colon. Cecum measures 9.0 centimeters. The colon is dilated and stool filled throughout its course to level of the descending colon where there is a beak adjacent to the bladder mass, best seen on image 32/5. This abrupt transition zone is consistent with malignant adhesion or mass effects and causing significant obstruction. A second possible narrowing is also identified within the sigmoid colon best seen on coronal image 19/5. There is no evidence for free intraperitoneal air. Vascular/Lymphatic: Dense atherosclerotic calcifications of the abdominal aorta. No aneurysm. Reproductive: Prostatic calcifications. Previous TURP. Other: No ascites. Small amount of fluid within the RIGHT inguinal canal. Musculoskeletal: Degenerative changes are seen spine and hips, RIGHT greater than LEFT. Previous ORIF of the RIGHT hip. Significant degenerative changes and postoperative IMPRESSION: 1. Large bowel obstruction likely related to bladder tumor. Sharp transition zone in the LOWER descending colon is consistent with malignant adhesion, direct invasion by the tumor, or mass effect from  the tumor. Cecum measures up to 9 centimeters. 2. Suspect involvement of the anterior abdominal wall by bladder tumor. 3. No evidence for free intraperitoneal air. 4. Bilateral percutaneous nephrostomy catheters. 5. Hiatal hernia. 6. Coronary artery disease. 7. Previous TURP. 8. Aortic Atherosclerosis (ICD10-I70.0). These results were called by telephone at the time of interpretation on 06/06/2019 at 8:58 pm to Dr. Marjean Donna , who verbally acknowledged these results. Electronically Signed   By: Nolon Nations M.D.   On: 06/06/2019 21:00     ASSESSMENT AND PLAN:   *  Large bowel obstruction S/p Transverse loop colostomy POD-1 NG tube in place.  Low intermittent suction.  *  Bladder cancer Not a candidate for chemotherapy. Discussed with Dr. [name]  *Hypokalemia.  Resolved  * UTI - With history of Pseudomonas - Started on Zosyn.  All the records are reviewed and case discussed with Care Management/Social Worker Management plans discussed with the patient.  CODE STATUS: DNR  DVT Prophylaxis: Lovenox  TOTAL TIME TAKING CARE OF THIS PATIENT: 35 minutes.   POSSIBLE D/C IN 1-2 DAYS, DEPENDING ON CLINICAL CONDITION.  Leia Alf Margues Filippini M.D on 06/08/2019 at 1:00 PM  Between 7am to 6pm - Pager - [phone]  After 6pm go to [email or web address] - password [email or web address] [facility]  Office  [phone]  CC: Primary care physician; [name], MD  Note: This dictation was prepared with Dragon dictation along with smaller phrase technology. Any transcriptional errors that result from this process are unintentional.

## 2019-06-08 NOTE — Op Note (Signed)
Preoperative diagnosis: Large bowel obstruction  Postoperative diagnosis: Large bowel obstruction.  Procedure: Transverse loop colostomy.  Anesthesia: GETA  Surgeon: Dr. Windell Moment, MD  Wound Classification: Clean Contaminated  Indications:  Patient is a 84 y.o. male with colonic obstruction required fecal diversion and loop colostomy was elected.   Findings: 1. Healthy but dilated transverse colon.  2. Adequate hemostasis  Description of procedure: The patient was taken to the operating room and placed supine on the table and general endotracheal anesthesia was induced. A time-out was completed verifying correct patient, procedure, site, positioning, and implant(s) and/ or special equipment prior to beginning this procedure. Patient with bilateral nephrostomy tubes. Nasogastric tube was inserted, and the abdomen was prepped and draped in the usual sterile fashion. Intravenous antibiotics were given prior to incision. A short transverse incision was made in the left upper quadrant. This incision was carried down deep through the subcutaneous tissues with electrocautery. The muscular and aponeurotic layers were incised with cruciate incisions and spread with retractors. The peritoneum was sharply entered. The transverse colon was immediately encountered and noted to be markedly distended.  In order to deliver the colon through the incision it was necessary to decompress it. The greater omentum was swept cephalad. A loop of transverse colon was then brought through the incision. Additional length was obtained by lysing the peritoneal attachments. The greater omentum over the proposed colostomy area was then dissected free from the colon and ligated.  A mesenteric window was created under the colon by incising the mesentery adjacent to the colon wall. A suction catheter was inserted under the colon through the mesenteric window in order to hold the exteriorized segment of transverse colon above  the skin and fascia. The colon was then allowed to rest on the rod. The size of the fascial defect was assessed. Interrupted sutures of 0 vicryl were placed to the colon to close the fascia so that it was tight enough to allow only the loop of colon plus and one index finger, thus ensuring that the defect was snug but not overly constricting. The skin was closed around the colon with interrupted skin staples. Towels were placed to protect the field and the colon was incised and opened transversely. The colonic contents were suctioned out. The double barrel colostomy was matured with multiple interrupted mucoserous cutaneous sutures of 3-0 Vicryl circumferentially in Brooke fashion. After maturation, the colostomy was noted to be pink and viable.  A colostomy appliance was then placed.  The patient tolerated the procedure well and was taken to the postanesthesia care unit in stable condition.   Specimen: None  Complications: None  Estimated Blood Loss: 25 mL

## 2019-06-08 NOTE — Progress Notes (Signed)
Notified by [facility] that pt will be transferring to ICU 4 after surgery. Pt's family updated and escorted to ICU waiting room.

## 2019-06-08 NOTE — Anesthesia Preprocedure Evaluation (Signed)
Anesthesia Evaluation  Patient identified by MRN, date of birth, ID band Patient awake    Reviewed: Allergy & Precautions, Patient's Chart, lab work & pertinent test results, Unable to perform ROS - Chart review only  History of Anesthesia Complications Negative for: history of anesthetic complications  Airway Mallampati: III       Dental  (+) Poor Dentition, Missing, Loose, Chipped   Pulmonary neg sleep apnea, neg COPD,           Cardiovascular hypertension, Pt. on medications + CAD (Pt with rising troponins)  (-) CHF (-) Valvular Problems/Murmurs     Neuro/Psych neg Seizures Anxiety    GI/Hepatic GERD  Medicated,  Endo/Other  neg diabetes  Renal/GU Renal disease (r hydroneprosis, bladder ca)     Musculoskeletal   Abdominal   Peds  Hematology  (+) anemia ,   Anesthesia Other Findings   Reproductive/Obstetrics                             Anesthesia Physical Anesthesia Plan  ASA: IV and emergent  Anesthesia Plan: General   Post-op Pain Management:    Induction: Intravenous  PONV Risk Score and Plan: 2 and Dexamethasone and Ondansetron  Airway Management Planned: Oral ETT  Additional Equipment:   Intra-op Plan:   Post-operative Plan: Post-operative intubation/ventilation and Possible Post-op intubation/ventilation  Informed Consent: I have reviewed the patients History and Physical, chart, labs and discussed the procedure including the risks, benefits and alternatives for the proposed anesthesia with the patient or authorized representative who has indicated his/her understanding and acceptance.   Patient has DNR.  Suspend DNR.     Plan Discussed with:   Anesthesia Plan Comments: (Pt understands the high risk of morbidity and mortality of this procedure given his comorbidities, Also the high likelihood of post-op ventilation. Pt and surgeon desire to proceed and surgeon  states that this is emergent. )        Anesthesia Quick Evaluation

## 2019-06-08 NOTE — Consult Note (Signed)
PULMONARY / CRITICAL CARE MEDICINE  Name: Vincent Poole MRN: [id number] DOB: 06/27/1935    LOS: 2  Referring Provider: Dr. Windell Moment Reason for Referral: Large bowel obstruction status post transverse loop colostomy Brief patient description: 84 year old male presenting with abdominal pain, found to have large bowel obstruction and taken emergently to surgery.  He is now status post transverse loop colostomy.  HPI:  84 year old male with a history of stage IV advanced bladder cancer with metastasis currently undergoing immunotherapy, chronic bilateral nephrostomy tubes, recurrent UTI, chronic Foley atrial flutter, DVTs on Eliquis, hypertension, and anemia; recent hospitalization for urosepsis on 04/29/2019, who presented to the ED with complaints of abdominal pain, nausea, vomiting, constipation and generalized weakness.  He reported no bowel movement in 5 days.  Denied any hematemesis.  Stat CT abdomen showed a large bowel obstruction hence surgery was consulted.  He was taken  to the OR this evening and underwent a transverse loop colostomy.  He is now extubated, alert and offers no complaints.  His vital signs are stable.  Ostomy looks clean with mild serosanguineous drainage.  Nephrostomy tubes are in place and draining normally.  He has an NG tube to low intermittent suction with minimal output. Of note, patient is being followed by Dr. [name] and has completed 5 cycles of gemcitabine and carboplatin.  He is currently on palliative Tecentriq. He is extremely hard of hearing  Past Medical History:  Diagnosis Date  . Anxiety   . Arthritis   . Bladder cancer ([place])   . Dysrhythmia 07/2018   history of atrial flutter that worsens with anxiety  . Femur fracture, right ([place]) 05/01/2018  . GERD (gastroesophageal reflux disease)   . History of recent blood transfusion 05/2018  . Hypertension   . Iron deficiency anemia 09/14/2018  . Prostate cancer ([place]) 07/2018   cancer growing  in prostate but not prostate cancer, it is from the bladder  . Umbilical hernia 83/4196  . Urinary retention 2019   foley catheter place 11/2017  . UTI (urinary tract infection) 2019   frequent UTI's over last year  . Wound eschar of foot 07/2018   left heal getting wrapped and requiring antibiotic cream. cracks open with weight bearing.   Past Surgical History:  Procedure Laterality Date  . CARPAL TUNNEL RELEASE Right   . CHOLECYSTECTOMY  2004  . CYSTOGRAM  07/18/2018   Procedure: CYSTOGRAM;  Surgeon: [name], MD;  Location: [facility] ORS;  Service: Urology;;  . Consuela Mimes W/ RETROGRADES Bilateral 07/18/2018   Procedure: CYSTOSCOPY WITH RETROGRADE PYELOGRAM;  Surgeon: [name], MD;  Location: [facility];  Service: Urology;  Laterality: Bilateral;  . CYSTOSCOPY W/ URETERAL STENT PLACEMENT Bilateral 12/27/2017   Procedure: CYSTOSCOPY WITH RETROGRADE PYELOGRAM/URETERAL STENT PLACEMENT;  Surgeon: [name], MD;  Location: [facility];  Service: Urology;  Laterality: Bilateral;  . CYSTOSCOPY W/ URETERAL STENT PLACEMENT Bilateral 07/18/2018   Procedure: CYSTOSCOPY WITH STENT REPLACEMENT (exchange);  Surgeon: [name], MD;  Location: [facility];  Service: Urology;  Laterality: Bilateral;  . CYSTOSCOPY WITH STENT PLACEMENT Bilateral 11/12/2018   Procedure: [place] WITH STENT Exchange;  Surgeon: [name], MD;  Location: [facility];  Service: Urology;  Laterality: Bilateral;  . INTRAMEDULLARY (IM) NAIL INTERTROCHANTERIC Right 05/02/2018   Procedure: INTRAMEDULLARY (IM) NAIL INTERTROCHANTRIC;  Surgeon: [name], MD;  Location: [facility];  Service: Orthopedics;  Laterality: Right;  . IR NEPHROSTOMY EXCHANGE LEFT  03/25/2019  . IR NEPHROSTOMY EXCHANGE LEFT  04/19/2019  . IR NEPHROSTOMY  EXCHANGE LEFT  05/31/2019  . IR NEPHROSTOMY EXCHANGE RIGHT  03/25/2019  . IR NEPHROSTOMY EXCHANGE RIGHT  04/19/2019  . IR NEPHROSTOMY EXCHANGE RIGHT  05/31/2019  . IR NEPHROSTOMY PLACEMENT LEFT   02/23/2019  . IR NEPHROSTOMY PLACEMENT RIGHT  02/23/2019  . LEG TENDON SURGERY Right 1958  . PORTA CATH INSERTION N/A 01/22/2018   Procedure: PORTA CATH INSERTION;  Surgeon: [name], MD;  Location: [facility];  Service: Cardiovascular;  Laterality: N/A;  . TRANSURETHRAL RESECTION OF BLADDER TUMOR N/A 12/27/2017   Procedure: TRANSURETHRAL RESECTION OF BLADDER TUMOR (TURBT);  Surgeon: [name], MD;  Location: [facility];  Service: Urology;  Laterality: N/A;  . TRANSURETHRAL RESECTION OF BLADDER TUMOR N/A 01/15/2018   Procedure: TRANSURETHRAL RESECTION OF BLADDER TUMOR (TURBT);  Surgeon: [name], MD;  Location: [facility];  Service: Urology;  Laterality: N/A;  Need 2 hrs for this case please  . TRANSURETHRAL RESECTION OF BLADDER TUMOR N/A 07/18/2018   Procedure: TRANSURETHRAL RESECTION OF BLADDER TUMOR (TURBT);  Surgeon: [name], MD;  Location: [facility];  Service: Urology;  Laterality: N/A;   No current facility-administered medications on file prior to encounter.    Current Outpatient Medications on File Prior to Encounter  Medication Sig  . acetaminophen (TYLENOL) 500 MG tablet Take 1,000 mg by mouth every 6 (six) hours as needed for moderate pain or headache.   . cetirizine (ZYRTEC) 10 MG tablet Take 10 mg by mouth daily as needed for allergies.   . Cranberry-Cholecalciferol (SUPER CRANBERRY/VITAMIN D3) 4200-500 MG-UNIT CAPS Take 1 capsule by mouth 2 (two) times daily.  Marland Kitchen ELIQUIS 5 MG TABS tablet TAKE ONE TABLET BY MOUTH TWICE DAILY  . ferrous sulfate 325 (65 FE) MG tablet Take 1 tablet (325 mg total) by mouth 2 (two) times daily with a meal.  . metoprolol tartrate (LOPRESSOR) 25 MG tablet Take 1 tablet (25 mg total) by mouth 2 (two) times daily.  . Multiple Vitamin (MULTIVITAMIN WITH MINERALS) TABS tablet Take 1 tablet by mouth daily.  Marland Kitchen MYRBETRIQ 50 MG TB24 tablet TAKE 1 TABLET BY MOUTH DAILY  . nystatin [facility]) powder Apply topically 2 (two) times daily.  Marland Kitchen  oxyCODONE (OXY IR/ROXICODONE) 5 MG immediate release tablet Take 1 tablet (5 mg total) by mouth every 4 (four) hours as needed for severe pain.  . polyethylene glycol powder (MIRALAX) powder Take 17 g by mouth daily as needed. Can increase to 3 times a day as needed for constipation but hold medication if has diarrhea  . vitamin B-12 (CYANOCOBALAMIN) 500 MCG tablet Take 500 mcg by mouth daily.  Marland Kitchen zinc oxide 20 % ointment Apply 1 application topically as needed for irritation.  . lidocaine-prilocaine (EMLA) cream Apply 1 application topically as needed.    Allergies No Known Allergies  Family History Family History  Problem Relation Age of Onset  . Cancer Mother   . Chronic Renal Failure Mother   . Heart disease Father    Social History  reports that he has never smoked. He has never used smokeless tobacco. He reports that he does not drink alcohol or use drugs.  Review Of Systems: Unable to obtain any comprehensive review of systems as patient is extremely hard of hearing.  An attempt was made to review all systems.  Reports resolution of abdominal pain nausea and vomiting.  VITAL SIGNS: BP 113/67   Pulse 84   Temp 98.1 F (36.7 C) (Axillary)   Resp 12   Ht 5\' 9"  (1.753  m)   Wt 78 kg   SpO2 96%   BMI 25.39 kg/m   HEMODYNAMICS:    VENTILATOR SETTINGS:    INTAKE / OUTPUT: I/O last 3 completed shifts: In: 1339.3 [I.V.:693.3; IV Piggyback:646.1] Out: 550 [Urine:550]  PHYSICAL EXAMINATION: General: Awake, in no distress HEENT: Normocephalic and atraumatic, PERRLA, trachea midline, no JVD Neuro: Hard of hearing, follows basic commands with return instructions, moves all extremities, no focal deficits Cardiovascular: Apical pulse regular, S1-S2, no murmur regurg or gallop, +2 pulses bilaterally, no edema Lungs: Clear to auscultation bilaterally, no wheezes or rhonchi Abdomen: Hypoactive bowel sounds, left abdomen with colostomy and colostomy bag in place, red and beefy,  mild serosanguineous drainage from colostomy, mild pain with gentle palpation GU: Bilateral nephrostomy tubes in place draining clear urine Musculoskeletal: No joint deformities, positive range of motion Skin: Warm and dry,right chest wall with Mediport in place  LABS:  BMET Recent Labs  Lab 06/06/19 2017 06/07/19 0450 06/07/19 2334  NA 134* 137 138  K 2.2* 3.0* 2.9*  CL 100 104 105  CO2 22 25 24   BUN 25* 22 20  CREATININE 1.38* 1.24 1.16  GLUCOSE 169* 111* 88    Electrolytes Recent Labs  Lab 06/06/19 2017 06/07/19 0450 06/07/19 2334  CALCIUM 8.4* 8.2* 8.3*    CBC Recent Labs  Lab 06/06/19 2017 06/07/19 0450 06/08/19 0513  WBC 23.5* 21.4* 22.9*  HGB 9.7* 9.1* 8.5*  HCT 30.1* 29.0* 27.0*  PLT 547* 410* 353    Coag's Recent Labs  Lab 06/06/19 2017  APTT 42*  INR 1.5*    Sepsis Markers Recent Labs  Lab 06/06/19 2017  LATICACIDVEN 1.6    ABG No results for input(s): PHART, PCO2ART, PO2ART in the last 168 hours.  Liver Enzymes Recent Labs  Lab 06/06/19 2017  AST 11*  ALT 7  ALKPHOS 75  BILITOT 0.7  ALBUMIN 2.7*    Cardiac Enzymes No results for input(s): TROPONINI, PROBNP in the last 168 hours.  Glucose Recent Labs  Lab 06/08/19 0423  GLUCAP 101*    Imaging No results found.   STUDIES:  None CULTURES: Blood cultures x2 Urine culture  ANTIBIOTICS: Zosyn  SIGNIFICANT EVENTS: 06/06/2019: Admitted with abdominal pain nausea and vomiting 06/07/2019: Underwent transverse loop colostomy  LINES/TUBES: Peripheral IVs Right chest wall Mediport NG tube Chronic Foley  DISCUSSION: 84 year old male with stage IV bladder cancer with metastasis presenting with a large bowel obstruction now status post transverse loop colostomy.  Stable postoperatively.  ASSESSMENT  Large bowel obstruction status post transverse loop colostomy Postoperative pain Stage IV advanced bladder cancer currently on palliative  Tecentriq Recurrent  UTIs Hydronephrosis and pyelonephritis status post nephrostomy tube Iron deficiency anemia Hypokalemia PLAN Hemodynamic monitoring per ICU protocol Surgery, urology and oncology following Postoperative pain management with Dilaudid 0.5 mg every 4 hours as needed Monitor and replace electrolytes Continue antibiotics as above Follow-up cultures Colostomy care per protocol Nephrostomy tube care per protocol Gentle IV hydration with D5 half-normal saline at 50 cc an hour NG tube to low intermittent suction per surgery N.p.o. Palliative care consult for goals of care  Best Practice: Code Status: DNR/DNI Diet: N.p.o. GI prophylaxis: Protonix VTE prophylaxis: Subcu Lovenox  FAMILY  - Updates: Family already updated by surgical team.  Will update with any new changes in condition or treatment plan  Kyrie Fludd S. Tukov-Yual ANP-BC Pulmonary and [place] Pager [phone] or [phone]  NB: This document was prepared using Systems analyst and  may include unintentional dictation errors.    06/08/2019, 6:18 AM

## 2019-06-09 ENCOUNTER — Encounter: Payer: Self-pay | Admitting: General Surgery

## 2019-06-09 LAB — COMPREHENSIVE METABOLIC PANEL
ALT: <5 U/L (ref 0–44)
AST: 9 U/L — ABNORMAL LOW (ref 15–41)
Albumin: 2.2 g/dL — ABNORMAL LOW (ref 3.5–5.0)
Alkaline Phosphatase: 60 U/L (ref 38–126)
Anion gap: 7 (ref 5–15)
BUN: 23 mg/dL (ref 8–23)
CO2: 25 mmol/L (ref 22–32)
Calcium: 8.1 mg/dL — ABNORMAL LOW (ref 8.9–10.3)
Chloride: 105 mmol/L (ref 98–111)
Creatinine, Ser: 1.24 mg/dL (ref 0.61–1.24)
GFR calc Af Amer: >60 mL/min (ref >60)
GFR calc non Af Amer: 54 mL/min — ABNORMAL LOW (ref >60)
Glucose, Bld: 120 mg/dL — ABNORMAL HIGH (ref 70–99)
Potassium: 4.1 mmol/L (ref 3.5–5.1)
Sodium: 137 mmol/L (ref 135–145)
Total Bilirubin: 0.3 mg/dL (ref 0.3–1.2)
Total Protein: 5.4 g/dL — ABNORMAL LOW (ref 6.5–8.1)

## 2019-06-09 LAB — CBC
HCT: 30.3 % — ABNORMAL LOW (ref 39.0–52.0)
Hemoglobin: 9.2 g/dL — ABNORMAL LOW (ref 13.0–17.0)
MCH: 28.1 pg (ref 26.0–34.0)
MCHC: 30.4 g/dL (ref 30.0–36.0)
MCV: 92.7 fL (ref 80.0–100.0)
Platelets: 471 10*3/uL — ABNORMAL HIGH (ref 150–400)
RBC: 3.27 MIL/uL — ABNORMAL LOW (ref 4.22–5.81)
RDW: 15.3 % (ref 11.5–15.5)
WBC: 27.6 10*3/uL — ABNORMAL HIGH (ref 4.0–10.5)
nRBC: 0 % (ref 0.0–0.2)

## 2019-06-09 LAB — MAGNESIUM: Magnesium: 2.1 mg/dL (ref 1.7–2.4)

## 2019-06-09 LAB — PROCALCITONIN: Procalcitonin: 0.4 ng/mL

## 2019-06-09 LAB — PHOSPHORUS: Phosphorus: 3.1 mg/dL (ref 2.5–4.6)

## 2019-06-09 MED ORDER — ACETAMINOPHEN 325 MG PO TABS
650.0000 mg | ORAL_TABLET | Freq: Four times a day (QID) | ORAL | Status: DC | PRN
  Administered 2019-06-09: 650 mg via ORAL
  Filled 2019-06-09: qty 2

## 2019-06-09 MED ORDER — ENSURE ENLIVE PO LIQD
237.0000 mL | Freq: Two times a day (BID) | ORAL | Status: DC
  Administered 2019-06-09 – 2019-06-12 (×7): 237 mL via ORAL

## 2019-06-09 NOTE — Progress Notes (Signed)
Per MD, nasogastric tube removed. Orders followed. Will continue to monitor.

## 2019-06-09 NOTE — Progress Notes (Signed)
Patient has temp of 100.5 and nothing ordered to help with the fever. On call provider notified, Gardiner Barefoot, and she gave me a verbal order for tylenol. Administered to patient. Will do follow up temp.

## 2019-06-09 NOTE — Progress Notes (Addendum)
[facility] Hospital Day(s): 3.   Post op day(s): 1 Day Post-Op.   Interval History: Patient seen and examined, no acute events or new complaints overnight. Patient reports feeling well today.  He reports that the pain is controlled.  He denies any nausea.  He has some stool and air in the colostomy bag.  Vital signs in last 24 hours: [min-max] current  Temp:  [97.6 F (36.4 C)-98.1 F (36.7 C)] 97.6 F (36.4 C) (08/02 0357) Pulse Rate:  [69-84] 84 (08/02 0357) Resp:  [13-21] 18 (08/02 0357) BP: (103-115)/(45-60) 107/48 (08/02 0357) SpO2:  [97 %-100 %] 100 % (08/02 0357) Weight:  [77.5 kg] 77.5 kg (08/02 0124)     Height: 5\' 11"  (180.3 cm) Weight: 77.5 kg BMI (Calculated): 23.84   Physical Exam:  Constitutional: alert, cooperative and no distress  Respiratory: breathing non-labored at rest  Cardiovascular: regular rate and sinus rhythm  Gastrointestinal: soft, non-tender, and non-distended  Labs:  CBC Latest Ref Rng & Units 06/09/2019 06/08/2019 06/08/2019  WBC 4.0 - 10.5 K/uL 27.6(H) 23.5(H) 22.9(H)  Hemoglobin 13.0 - 17.0 g/dL 9.2(L) 8.2(L) 8.5(L)  Hematocrit 39.0 - 52.0 % 30.3(L) 26.6(L) 27.0(L)  Platelets 150 - 400 K/uL 471(H) 324 353   CMP Latest Ref Rng & Units 06/09/2019 06/08/2019 06/07/2019  Glucose 70 - 99 mg/dL 120(H) 110(H) 88  BUN 8 - 23 mg/dL 23 20 20   Creatinine 0.61 - 1.24 mg/dL 1.24 1.15 1.16  Sodium 135 - 145 mmol/L 137 137 138  Potassium 3.5 - 5.1 mmol/L 4.1 4.3 2.9(L)  Chloride 98 - 111 mmol/L 105 106 105  CO2 22 - 32 mmol/L 25 21(L) 24  Calcium 8.9 - 10.3 mg/dL 8.1(L) 8.2(L) 8.3(L)  Total Protein 6.5 - 8.1 g/dL 5.4(L) - -  Total Bilirubin 0.3 - 1.2 mg/dL 0.3 - -  Alkaline Phos 38 - 126 U/L 60 - -  AST 15 - 41 U/L 9(L) - -  ALT 0 - 44 U/L <5 - -    Imaging studies: No new pertinent imaging studies   Assessment/Plan:  83 y.o.malewith large bowel obstruction status post transverse loop colostomy POD 2, complicated by pertinent  comorbidities includingbladder cancer with metastases that has not responded to immunotherapy, hypertension, hydronephrosis, severe weakness. Patient  recovering well.  Today with stool and air in the bag.  The pain is controlled.  There is no abdominal distention.  I will order to discontinue the NGT and start full liquid diet.  Will assess for toleration.  May advance diet as tolerated.  Patient leukocytosis is within normal limits for him.  He has been in the 20s and 30s all this year.  Stable hemoglobin.  We will continue medical management by primary team.  Arnold Long, MD    Addendum:   Wife was updated about improvement with the colostomy and diet trial.

## 2019-06-09 NOTE — Progress Notes (Signed)
[facility] [place] NAME: Vincent Poole    MR#:  [id number]  DATE OF BIRTH:  1935-03-06  SUBJECTIVE:  CHIEF COMPLAINT:   Chief Complaint  Patient presents with  . Emesis  . Abdominal Pain  . Constipation   Very hard of hearing.  Had to write on paper to communicate.  Mild pain lower abdomen NGT removed today and started on liquids  REVIEW OF SYSTEMS:    Review of Systems  Constitutional: Positive for malaise/fatigue. Negative for chills and fever.  HENT: Negative for sore throat.   Eyes: Negative for blurred vision, double vision and pain.  Respiratory: Negative for cough, hemoptysis, shortness of breath and wheezing.   Cardiovascular: Negative for chest pain, palpitations, orthopnea and leg swelling.  Gastrointestinal: Positive for abdominal pain. Negative for constipation, diarrhea, heartburn, nausea and vomiting.  Genitourinary: Negative for dysuria and hematuria.  Musculoskeletal: Negative for back pain and joint pain.  Skin: Negative for rash.  Neurological: Negative for sensory change, speech change, focal weakness and headaches.  Endo/Heme/Allergies: Does not bruise/bleed easily.  Psychiatric/Behavioral: Negative for depression. The patient is not nervous/anxious.     DRUG ALLERGIES:  No Known Allergies  VITALS:  Blood pressure (!) 107/48, pulse 84, temperature 97.6 F (36.4 C), temperature source Oral, resp. rate 18, height 5\' 11"  (1.803 m), weight 77.5 kg, SpO2 100 %.  PHYSICAL EXAMINATION:   Physical Exam  GENERAL:  84 y.o.-year-old patient lying in the bed with no acute distress. Decreased hearing EYES: Pupils equal, round, reactive to light and accommodation. No scleral icterus. Extraocular muscles intact.  HEENT: Head atraumatic, normocephalic. Oropharynx and nasopharynx clear.  NECK:  Supple, no jugular venous distention. No thyroid enlargement, no tenderness.  LUNGS: Normal breath sounds bilaterally, no  wheezing, rales, rhonchi. No use of accessory muscles of respiration.  CARDIOVASCULAR: S1, S2 normal. No murmurs, rubs, or gallops.  ABDOMEN: Soft, colostomy bag in place EXTREMITIES: No cyanosis, clubbing or edema b/l.    NEUROLOGIC: Cranial nerves II through XII are intact. No focal Motor or sensory deficits b/l.   PSYCHIATRIC: The patient is alert and oriented x 3.  SKIN: No obvious rash, lesion, or ulcer.   LABORATORY PANEL:   CBC Recent Labs  Lab 06/09/19 0455  WBC 27.6*  HGB 9.2*  HCT 30.3*  PLT 471*   ------------------------------------------------------------------------------------------------------------------ Chemistries  Recent Labs  Lab 06/09/19 0455  NA 137  K 4.1  CL 105  CO2 25  GLUCOSE 120*  BUN 23  CREATININE 1.24  CALCIUM 8.1*  MG 2.1  AST 9*  ALT <5  ALKPHOS 60  BILITOT 0.3   ------------------------------------------------------------------------------------------------------------------  Cardiac Enzymes No results for input(s): TROPONINI in the last 168 hours. ------------------------------------------------------------------------------------------------------------------  RADIOLOGY:  No results found.   ASSESSMENT AND PLAN:   *  Large bowel obstruction S/p Transverse loop colostomy POD-2 NGT removed. Full liquid diet Appreciate surgery team help  *  Bladder cancer Not a candidate for chemotherapy. Discussed with Dr. [name]  *Hypokalemia.  Resolved  * UTI ruled out - Cx show multiple species. Abx stopped  All the records are reviewed and case discussed with Care Management/Social Worker Management plans discussed with the patient.  CODE STATUS: DNR  DVT Prophylaxis: Lovenox  TOTAL TIME TAKING CARE OF THIS PATIENT: 35 minutes.   POSSIBLE D/C IN 1-2 DAYS, DEPENDING ON CLINICAL CONDITION.  Neita Carp M.D on 06/09/2019 at 11:43 AM  Between 7am to 6pm - Pager - [phone]  After 6pm go to [email or web address] -  password [email or web address] [facility]  Office  [phone]  CC: Primary care physician; [name], MD  Note: This dictation was prepared with Dragon dictation along with smaller phrase technology. Any transcriptional errors that result from this process are unintentional.

## 2019-06-09 NOTE — Progress Notes (Signed)
Initial Nutrition Assessment  DOCUMENTATION CODES:   Severe malnutrition in context of chronic illness  INTERVENTION:  Provide Ensure Enlive po BID, each supplement provides 350 kcal and 20 grams of protein.  NUTRITION DIAGNOSIS:   Severe Malnutrition related to chronic illness(metastatic bladder cancer) as evidenced by severe fat depletion, moderate-severe muscle depletion.  GOAL:   Patient will meet greater than or equal to 90% of their needs  MONITOR:   PO intake, Supplement acceptance, Labs, Weight trends, Skin, I & O's  REASON FOR ASSESSMENT:   Malnutrition Screening Tool    ASSESSMENT:   84 year old male with PMHx of anxiety, arthritis, HTN, GERD, iron deficiency anemia, bladder cancer who is very hard of hearing who was admitted with large bowel obstruction s/p transverse loop colostomy on 8/1.   Per chart patient's NGT was removed earlier today and diet was advanced to full liquids. Since RD assessment diet has now been advanced to soft.   Met with patient at bedside today. Patient is very hard of hearing so he was unable to understand questions RD was asking regarding nutrition and weight history. He reports he is tolerating his liquids well. He is finishing almost all of his trays per his report.  Per review of weight history in chart, patient's UBW was around 187-189 lbs. He last weighed 187 lbs in 04/2018. Patient is now 77.5 kg (170.86 lbs) if current weight is accurate.  Medications reviewed and include: pantoprazole.  Labs reviewed.  NUTRITION - FOCUSED PHYSICAL EXAM:    Most Recent Value  Orbital Region  Severe depletion  Upper Arm Region  Severe depletion  Thoracic and Lumbar Region  Moderate depletion  Buccal Region  Severe depletion  Temple Region  Severe depletion  Clavicle Bone Region  Severe depletion  Clavicle and Acromion Bone Region  Severe depletion  Scapular Bone Region  Unable to assess  Dorsal Hand  Moderate depletion  Patellar Region   Moderate depletion  Anterior Thigh Region  Moderate depletion  Posterior Calf Region  Severe depletion  Edema (RD Assessment)  Mild  Hair  Reviewed  Eyes  Reviewed  Mouth  Reviewed  Skin  Reviewed  Nails  Reviewed     Diet Order:   Diet Order            DIET SOFT Room service appropriate? Yes; Fluid consistency: Thin  Diet effective now             EDUCATION NEEDS:   No education needs have been identified at this time  Skin:  Skin Assessment: Skin Integrity Issues:(closed incision to abdomen)  Last BM:  06/02/2019 per chart  Height:   Ht Readings from Last 1 Encounters:  06/09/19 '5\' 11"'$  (1.803 m)   Weight:   Wt Readings from Last 1 Encounters:  06/09/19 77.5 kg   Ideal Body Weight:  78.2 kg  BMI:  Body mass index is 23.83 kg/m.  Estimated Nutritional Needs:   Kcal:  9390-3009  Protein:  95-115 grams  Fluid:  1.9 L/day  Willey Blade, MS, RD, LDN Office: [phone] Pager: [phone] After Hours/Weekend Pager: [phone]

## 2019-06-09 NOTE — Plan of Care (Signed)
  Problem: Education: Goal: Knowledge of General Education information will improve Description: Including pain rating scale, medication(s)/side effects and non-pharmacologic comfort measures Outcome: Progressing   Problem: Health Behavior/Discharge Planning: Goal: Ability to manage health-related needs will improve Outcome: Progressing   Problem: Clinical Measurements: Goal: Ability to maintain clinical measurements within normal limits will improve Outcome: Progressing Goal: Will remain free from infection Outcome: Progressing Goal: Diagnostic test results will improve Outcome: Progressing Goal: Respiratory complications will improve Outcome: Progressing   Problem: Activity: Goal: Risk for activity intolerance will decrease Outcome: Progressing   Problem: Pain Managment: Goal: General experience of comfort will improve Outcome: Progressing   Problem: Safety: Goal: Ability to remain free from injury will improve Outcome: Progressing   Problem: Skin Integrity: Goal: Risk for impaired skin integrity will decrease Outcome: Progressing   

## 2019-06-10 DIAGNOSIS — Z515 Encounter for palliative care: Secondary | ICD-10-CM

## 2019-06-10 DIAGNOSIS — R531 Weakness: Secondary | ICD-10-CM

## 2019-06-10 DIAGNOSIS — C678 Malignant neoplasm of overlapping sites of bladder: Secondary | ICD-10-CM

## 2019-06-10 DIAGNOSIS — R1084 Generalized abdominal pain: Secondary | ICD-10-CM

## 2019-06-10 LAB — PROCALCITONIN: Procalcitonin: 0.42 ng/mL

## 2019-06-10 MED ORDER — LOPERAMIDE HCL 2 MG PO CAPS
2.0000 mg | ORAL_CAPSULE | Freq: Once | ORAL | Status: AC
  Administered 2019-06-10: 14:00:00 2 mg via ORAL
  Filled 2019-06-10: qty 1

## 2019-06-10 NOTE — Consult Note (Addendum)
[facility] Nurse ostomy consult note Pt had colostomy surgery performed on 8/3.  Initial pouch change demonstration performed with wife, daughter, and granddaughter at the bedside.  Pt will need total assistance with ostomy pouch application and emptying, and the family members state they will perform these activities after discharge.  Stoma type/location:  Stom is red and viable, 1 3/4 inches. Slightly oval, flush with skin level.  Current pouch is leaking behind the barrier. Peristomal assessment:  Staples intact to both sides of stoma and rubber rod is in place. Output: 100 cc brown liquid stool  Ostomy pouching: 2pc.  Education provided:  Demonstrated pouch application to family members at the bedside, adding a barrier ring to assist with maintaining a seal, and 2 piece pouching system.  They were able to open and close to empty.  4 sets of barrier rings, wafers and pouches left at the bedside and educational materials are in the room.  Reviewed pouching routines and ordering supplies. Enrolled patient in [place] program: Yes WOC will see again on Wed if patient is still in the hospital at that time. Julien Girt MSN, RN, [place], [place], [place]

## 2019-06-10 NOTE — Progress Notes (Signed)
[name], male   DOB: [birth year], 84 y.o.   MRN: [id number]  This NP visited patient at the bedside as a follow up for palliative medicine needs and emotional support.  Patient is alert and oriented and taking and tolerating fluids this morning.  He does complain of generalized abdominal discomfort.  Placed call and spoke to his wife regarding current medical situation.  We had a continued conversation regarding diagnosis, prognosis, goals of care, treatment options and anticipatory care needs.  Patient's wife today is focused on the fact that "my husband is not ready for discharge yet".  She is hoping that the patient can have a few more days increase his oral intake and get a little bit stronger before being discharged home to her care.   She verbalizes frustration with what she perceives to be discharged "too soon" in the past.  Although she verbalizes an understanding of the seriousness of her husband's current medical situation, she also states "I do not know how this has happened so fast his cancer was gone in June."  We discussed the limitations of medical interventions when the body begins to fail to thrive secondary to serious disease.   Wife's decisions were a heavy on input from oncology/Dr. [name].  Family very much trust Dr. Elroy Channel judgment and care  Discussed with wife the importance of continued conversation with her husband/family and the medical providers regarding overall plan of care and treatment options,  ensuring decisions are within the context of the patients values and GOCs.  Questions and concerns addressed   I will meet with Ms. Hassell Done tomorrow morning at 10:00.  at the bedside  Total time spent on the unit was 35 minutes  Greater than 50% of the time was spent in counseling and coordination of care  Wadie Lessen NP  Palliative Medicine Team Team Phone # [phone] Pager [phone]

## 2019-06-10 NOTE — Progress Notes (Signed)
[facility] Hospital Day(s): 4.   Post op day(s): 2 Days Post-Op.   Interval History: Patient seen and examined, no acute events or new complaints overnight. Patient reports feeling well.  Reports that he has been tolerating diet.  He denies any significant complaint of pain.  This morning eating pancakes.  Adequate stool output.  Vital signs in last 24 hours: [min-max] current  Temp:  [97.8 F (36.6 C)-100.5 F (38.1 C)] 98.9 F (37.2 C) (08/02 2314) Pulse Rate:  [81-110] 98 (08/03 0448) Resp:  [17-18] 18 (08/03 0448) BP: (108-132)/(56-69) 132/69 (08/03 0448) SpO2:  [95 %-100 %] 100 % (08/03 0448)     Height: 5\' 11"  (180.3 cm) Weight: 77.5 kg BMI (Calculated): 23.84   Physical Exam:  Constitutional: alert, cooperative and no distress  Respiratory: breathing non-labored at rest  Cardiovascular: regular rate and sinus rhythm  Gastrointestinal: soft, non-tender, and non-distended.  Colostomy patent.  Labs:  CBC Latest Ref Rng & Units 06/09/2019 06/08/2019 06/08/2019  WBC 4.0 - 10.5 K/uL 27.6(H) 23.5(H) 22.9(H)  Hemoglobin 13.0 - 17.0 g/dL 9.2(L) 8.2(L) 8.5(L)  Hematocrit 39.0 - 52.0 % 30.3(L) 26.6(L) 27.0(L)  Platelets 150 - 400 K/uL 471(H) 324 353   CMP Latest Ref Rng & Units 06/09/2019 06/08/2019 06/07/2019  Glucose 70 - 99 mg/dL 120(H) 110(H) 88  BUN 8 - 23 mg/dL 23 20 20   Creatinine 0.61 - 1.24 mg/dL 1.24 1.15 1.16  Sodium 135 - 145 mmol/L 137 137 138  Potassium 3.5 - 5.1 mmol/L 4.1 4.3 2.9(L)  Chloride 98 - 111 mmol/L 105 106 105  CO2 22 - 32 mmol/L 25 21(L) 24  Calcium 8.9 - 10.3 mg/dL 8.1(L) 8.2(L) 8.3(L)  Total Protein 6.5 - 8.1 g/dL 5.4(L) - -  Total Bilirubin 0.3 - 1.2 mg/dL 0.3 - -  Alkaline Phos 38 - 126 U/L 60 - -  AST 15 - 41 U/L 9(L) - -  ALT 0 - 44 U/L <5 - -    Imaging studies: No new pertinent imaging studies   Assessment/Plan:  84 y.o.malewith large bowel obstructionstatus post transverse loop colostomy POD 3, complicated by pertinent  comorbidities includingbladder cancer with metastases that has not responded to immunotherapy, hypertension, hydronephrosis, severe weakness. Patientrecovering well.  Patient with adequate pain control.  Patient with stable vital signs.  Today tolerating soft diet.  Adequate stool output per colostomy. I consider that the patient and the family needs to discuss with primary team, oncology  and palliative about future goals.  Wife told me that the patient is now ready for hospice.  As discussed previously during this admission I think that there is a lot of family issues that need to be addressed.  From surgery standpoint diet can be advanced to regular as tolerated.  Medical condition managed as per primary team.  Arnold Long, MD

## 2019-06-10 NOTE — Progress Notes (Signed)
[facility] at [place] NAME: Vincent Poole    MR#:  [id number]  DATE OF BIRTH:  1935/07/06  SUBJECTIVE:   doing ok this am tolerated diet well Has colostomy bag Denies nausea or vomiting.  Denies abdominal pain.  Patient actually doing fairly well.  Denies shortness of breath. REVIEW OF SYSTEMS:    Review of Systems  Constitutional: Negative for fever, chills weight loss HENT: Negative for ear pain, nosebleeds, congestion, facial swelling, rhinorrhea, neck pain, neck stiffness and ear discharge.   Hard of hearing Respiratory: Negative for cough, shortness of breath, wheezing  Cardiovascular: Negative for chest pain, palpitations and leg swelling.  Gastrointestinal: Negative for heartburn, abdominal pain, vomiting, diarrhea or consitpation Genitourinary: Negative for dysuria, urgency, frequency, hematuria Musculoskeletal: Negative for back pain or joint pain Neurological: Negative for dizziness, seizures, syncope, focal weakness,  numbness and headaches.  Hematological: Does not bruise/bleed easily.  Psychiatric/Behavioral: Negative for hallucinations, confusion, dysphoric mood    Tolerating Diet: yes      DRUG ALLERGIES:  No Known Allergies  VITALS:  Blood pressure 132/69, pulse 98, temperature 98.9 F (37.2 C), temperature source Oral, resp. rate 18, height 5\' 11"  (1.803 m), weight 77.5 kg, SpO2 100 %.  PHYSICAL EXAMINATION:  Constitutional: Appears well-developed and well-nourished. No distress. HENT: Normocephalic. Marland Kitchen Oropharynx is clear and moist.  Eyes: Conjunctivae and EOM are normal. PERRLA, no scleral icterus.  Neck: Normal ROM. Neck supple. No JVD. No tracheal deviation. CVS: RRR, S1/S2 +, no murmurs, no gallops, no carotid bruit.  Pulmonary: Effort and breath sounds normal, no stridor, rhonchi, wheezes, rales.  Abdominal: Patient has colostomy bag.  No tenderness, rebound or guarding.  No distention of abdomen.    Musculoskeletal: Normal range of motion.  1+ lower extremity edema and no tenderness.  Neuro: Alert. CN 2-12 grossly intact. No focal deficits. Skin: Skin is warm and dry. No rash noted. Psychiatric: Normal mood and affect.      LABORATORY PANEL:   CBC Recent Labs  Lab 06/09/19 0455  WBC 27.6*  HGB 9.2*  HCT 30.3*  PLT 471*   ------------------------------------------------------------------------------------------------------------------  Chemistries  Recent Labs  Lab 06/09/19 0455  NA 137  K 4.1  CL 105  CO2 25  GLUCOSE 120*  BUN 23  CREATININE 1.24  CALCIUM 8.1*  MG 2.1  AST 9*  ALT <5  ALKPHOS 60  BILITOT 0.3   ------------------------------------------------------------------------------------------------------------------  Cardiac Enzymes No results for input(s): TROPONINI in the last 168 hours. ------------------------------------------------------------------------------------------------------------------  RADIOLOGY:  No results found.   ASSESSMENT AND PLAN:   84 year old male with stage IV bladder cancer and bilateral PCNs who presented to the emergency room with bowel obstruction secondary to local effects of bladder tumor.   1.  Bowel obstruction: Patient is postoperative day #2.  Patient has colostomy bag in place.  Management as per surgery.  2  Locally advanced bladder cancer stage IV causing large bowel obstruction: Patient eval by urology this morning.  3.  Bilateral PCN: Urine culture shows multiple species did recommend lab collection however patient has been ordered broad-spectrum antibiotics.  4.  Hypokalemia: This is resolved Plan to discharge home tomorrow.  Patient does not want palliative care services or hospice services at home.  He does not want home health as well.  Management plans discussed with the patient and wife is in agreement.  CODE STATUS: dnr  TOTAL TIME TAKING CARE OF THIS PATIENT: 30 minutes.      POSSIBLE  D/C tomorrow, DEPENDING ON CLINICAL CONDITION.   Bettey Costa M.D on 06/10/2019 at 11:39 AM  Between 7am to 6pm - Pager - [phone] After 6pm go to [email or web address] - password [email or web address] [facility]  Office  [phone]  CC: Primary care physician; [name], MD  Note: This dictation was prepared with Dragon dictation along with smaller phrase technology. Any transcriptional errors that result from this process are unintentional.

## 2019-06-10 NOTE — Progress Notes (Signed)
Urology Inpatient Progress Note  Subjective: Vincent Poole is a 84 y.o. male with stage IV bladder cancer and bilateral PCNs who presented with large bowel obstruction secondary to local effects of his bladder tumor. He is now POD2 from exploratory laparotomy with colostomy with Dr. Windell Moment. His code status is DNR.  NGT removed yesterday. Patient with one episode of fever to 38.1C yesterday that resolved with PO acetaminophen. Bilateral PCNs in place with no surrounding erythema, right tube with some pale pink drainage on gauze pad. Clear yellow urine in both collecting bags.  Patient is hard of hearing; I communicated with him today via dry erase board. He reports no pain or tenderness on palpation of his abdomen or PCN sites. Abdomen is rigid but non-distended, no guarding or rebound tenderness on physical exam. Colostomy bag in place with ample stool output. Patient complaining of malodorous stool.  Anti-infectives: Anti-infectives (From admission, onward)   Start     Dose/Rate Route Frequency Ordered Stop   06/07/19 0600  piperacillin-tazobactam (ZOSYN) IVPB 3.375 g  Status:  Discontinued     3.375 g 12.5 mL/hr over 240 Minutes Intravenous Every 8 hours 06/07/19 0448 06/09/19 1319   06/07/19 0445  cefTRIAXone (ROCEPHIN) 1 g in sodium chloride 0.9 % 100 mL IVPB  Status:  Discontinued     1 g 200 mL/hr over 30 Minutes Intravenous Every 24 hours 06/07/19 0432 06/07/19 0433   06/06/19 2115  piperacillin-tazobactam (ZOSYN) IVPB 3.375 g     3.375 g 100 mL/hr over 30 Minutes Intravenous  Once 06/06/19 2102 06/06/19 2308      Current Facility-Administered Medications  Medication Dose Route Frequency Provider Last Rate Last Dose  . acetaminophen (TYLENOL) tablet 650 mg  650 mg Oral Q6H PRN Mayer Camel, NP   650 mg at 06/09/19 2119  . Chlorhexidine Gluconate Cloth 2 % PADS 6 each  6 each Topical Daily Tukov-Yual, Magdalene S, NP   6 each at 06/10/19 0827  . enoxaparin (LOVENOX)  injection 40 mg  40 mg Subcutaneous Q24H Herbert Pun, MD   40 mg at 06/09/19 2119  . feeding supplement (ENSURE ENLIVE) (ENSURE ENLIVE) liquid 237 mL  237 mL Oral BID BM Hillary Bow, MD   237 mL at 06/10/19 0827  . HYDROmorphone (DILAUDID) injection 0.5 mg  0.5 mg Intravenous Q4H PRN Herbert Pun, MD   0.5 mg at 06/10/19 0450  . ondansetron (ZOFRAN) tablet 4 mg  4 mg Oral Q6H PRN Herbert Pun, MD       Or  . ondansetron (ZOFRAN) injection 4 mg  4 mg Intravenous Q6H PRN Herbert Pun, MD      . pantoprazole (PROTONIX) injection 40 mg  40 mg Intravenous Q24H Tukov-Yual, Magdalene S, NP   40 mg at 06/10/19 0450  . sodium chloride flush (NS) 0.9 % injection 3 mL  3 mL Intravenous Q12H Herbert Pun, MD   3 mL at 06/10/19 0827     Objective: Vital signs in last 24 hours: Temp:  [97.8 F (36.6 C)-100.5 F (38.1 C)] 98.9 F (37.2 C) (08/02 2314) Pulse Rate:  [81-110] 98 (08/03 0448) Resp:  [17-18] 18 (08/03 0448) BP: (108-132)/(56-69) 132/69 (08/03 0448) SpO2:  [95 %-100 %] 100 % (08/03 0448)  Intake/Output from previous day: 08/02 0701 - 08/03 0700 In: 1080 [P.O.:1080] Out: 2000 [Urine:1650; Stool:350] Intake/Output this shift: Total I/O In: 360 [P.O.:360] Out: 850 [Urine:650; Stool:200]  Physical Exam Vitals signs and nursing note reviewed.  Constitutional:  General: He is not in acute distress.    Appearance: He is not ill-appearing, toxic-appearing or diaphoretic.  Pulmonary:     Effort: Pulmonary effort is normal. No respiratory distress.  Abdominal:     Comments: See HPI  Genitourinary:    Comments: See HPI Skin:    General: Skin is warm and dry.  Neurological:     Mental Status: He is disoriented.     Motor: Weakness present.    Lab Results:  Recent Labs    06/08/19 1256 06/09/19 0455  WBC 23.5* 27.6*  HGB 8.2* 9.2*  HCT 26.6* 30.3*  PLT 324 471*   BMET Recent Labs    06/08/19 0513 06/09/19 0455  NA 137  137  K 4.3 4.1  CL 106 105  CO2 21* 25  GLUCOSE 110* 120*  BUN 20 23  CREATININE 1.15 1.24  CALCIUM 8.2* 8.1*   PT/INR Recent Labs    06/08/19 0649  LABPROT 15.8*  INR 1.3*   Assessment & Plan: 1. Locally advanced bladder cancer causing large bowel obstruction Colonic diversion completed with decompression of distended abdomen and ample stool output, reassuring for resolution of large bowel obstruction. Appreciate general surgery's involvement in this.  2. Bilateral PCNs Urine cultures with multiple species present, lab recommends recollection. Patient has been on multiple broad spectrum antibiotics since admission, so I suspect new cultures would be negative. Additionally, patient with leukocytosis at baseline and fever yesterday may be attributed to a postoperative reaction. Nevertheless, recommend recollection today to ensure clearance. -Repeat urine cultures  Thank you, we will continue to follow.  Debroah Loop, PA-C 06/10/2019

## 2019-06-10 NOTE — Progress Notes (Signed)
PT Cancellation Note  Patient Details Name: Vincent Poole MRN: [id number] DOB: 21-Feb-1935   Cancelled Treatment:    Reason Eval/Treat Not Completed: (Re-evaluation attempted.) Patient and wife currently declining participation with session despite encouragement, voicing preference to wait until next date for mobility attempts.  Wife voicing patient is "too emotional" today and needs to have a day of food to "build up strength" prior to attempting.  Will continue efforts next date as medically appropriate and patient/wife agreeable.  Of note, patient with near complete loss of hearing (per wife, acute issue over recent 2-3 weeks); requires use of white board for effective communication with patient (located in room).  Maricela Schreur H. Owens Shark, PT, DPT, NCS 06/10/19, 12:46 PM [phone]

## 2019-06-10 NOTE — Care Management Important Message (Signed)
Important Message  Patient Details  Name: Vincent Poole MRN: [id number] Date of Birth: 09/07/1935   Medicare Important Message Given:  Yes     Dannette Barbara 06/10/2019, 11:45 AM

## 2019-06-11 DIAGNOSIS — C689 Malignant neoplasm of urinary organ, unspecified: Secondary | ICD-10-CM

## 2019-06-11 DIAGNOSIS — E43 Unspecified severe protein-calorie malnutrition: Secondary | ICD-10-CM | POA: Insufficient documentation

## 2019-06-11 LAB — URINALYSIS, COMPLETE (UACMP) WITH MICROSCOPIC
Bilirubin Urine: NEGATIVE
Glucose, UA: NEGATIVE mg/dL
Ketones, ur: NEGATIVE mg/dL
Nitrite: POSITIVE — AB
Protein, ur: 30 mg/dL — AB
Specific Gravity, Urine: 1.009 (ref 1.005–1.030)
Squamous Epithelial / HPF: NONE SEEN (ref 0–5)
pH: 5 (ref 5.0–8.0)

## 2019-06-11 LAB — CULTURE, BLOOD (ROUTINE X 2)
Culture: NO GROWTH
Culture: NO GROWTH

## 2019-06-11 LAB — SARS CORONAVIRUS 2 BY RT PCR (HOSPITAL ORDER, PERFORMED IN ~~LOC~~ HOSPITAL LAB): SARS Coronavirus 2: NEGATIVE

## 2019-06-11 MED ORDER — CIPROFLOXACIN HCL 500 MG PO TABS: 500.0000 mg | ORAL_TABLET | Freq: Two times a day (BID) | ORAL | 0 refills | Status: DC

## 2019-06-11 MED ORDER — CIPROFLOXACIN HCL 500 MG PO TABS
500.0000 mg | ORAL_TABLET | Freq: Two times a day (BID) | ORAL | Status: DC
  Administered 2019-06-11 – 2019-06-12 (×3): 500 mg via ORAL
  Filled 2019-06-11 (×3): qty 1

## 2019-06-11 MED ORDER — ENSURE ENLIVE PO LIQD: 237.0000 mL | Freq: Two times a day (BID) | ORAL | 12 refills | Status: AC

## 2019-06-11 MED ORDER — SODIUM CHLORIDE 0.9 % IV BOLUS
250.0000 mL | Freq: Once | INTRAVENOUS | Status: AC
  Administered 2019-06-11: 06:00:00 250 mL via INTRAVENOUS

## 2019-06-11 NOTE — Progress Notes (Signed)
[name], male   DOB: 1935/05/01, 84 y.o.   MRN: [id number]  This NP visited patient at the bedside as a follow up for palliative medicine needs and emotional support.  Patient is alert and oriented. He is very [place] but can communicate with white board.  He denies pain or any concerns currently but is tearful  Spoke to his wife regarding current medical situation.  We had a continued conversation regarding diagnosis, prognosis, goals of care, treatment options and anticipatory care needs.  She continues to verbalize her frustration with her belief that the patient needs to stay in the hospital until he gets stronger.  We discussed the option of rehab and family is adamantly against that.  Although she verbalizes an understanding of the seriousness of her husband's current medical situation, I worry that she does not understand the limited prognosis.  We discussed the concept of mortality and the limitations of medical interventions when the body begins to fail.  She understands that he is not a candidate for further oncology treatments at this time.  I strongly encouraged Ms. Gittins to consider services in the home; home health, palliative, hospice.  Wife adamantly refuses to have anyone in her home and even stronger verbalizes no wish for hospice.   She tells me "I have been taking care of him for a long time, my granddaughter is a CNA, and we have other family members who are willing to help"  Discussed with wife the importance of continued conversation with her husband/family and the medical providers regarding overall plan of care and treatment options,  ensuring decisions are within the context of the patients values and GOCs.  Questions and concerns addressed   I left my contact information and encouraged her to call with questions and concerns.  Total time spent on the unit was 35 minutes   Discussed with Dr Benjie Karvonen and Sara/LCSW  Greater than 50% of the time was spent in  counseling and coordination of care  Wadie Lessen NP  Palliative Medicine Team Team Phone # [phone] Pager [phone]

## 2019-06-11 NOTE — Discharge Summary (Addendum)
[facility] [place] NAME: Vincent Poole    MR#:  [id number]  DATE OF BIRTH:  21-Jan-1935  DATE OF ADMISSION:  06/06/2019 ADMITTING PHYSICIAN: Christel Mormon, MD  DATE OF DISCHARGE: 06/12/2019  PRIMARY CARE PHYSICIAN: [name], MD    ADMISSION DIAGNOSIS:  Vomiting  cough  weak  DISCHARGE DIAGNOSIS:  Active Problems:   Large bowel obstruction (HCC)   Palliative care by specialist   Urothelial cancer ([place])   Weakness   Abdominal pain, generalized   Protein-calorie malnutrition, severe   SECONDARY DIAGNOSIS:   Past Medical History:  Diagnosis Date  . Anxiety   . Arthritis   . Bladder cancer ([place])   . Dysrhythmia 07/2018   history of atrial flutter that worsens with anxiety  . Femur fracture, right ([place]) 05/01/2018  . GERD (gastroesophageal reflux disease)   . History of recent blood transfusion 05/2018  . Hypertension   . Iron deficiency anemia 09/14/2018  . Prostate cancer ([place]) 07/2018   cancer growing in prostate but not prostate cancer, it is from the bladder  . Umbilical hernia 02/5996  . Urinary retention 2019   foley catheter place 11/2017  . UTI (urinary tract infection) 2019   frequent UTI's over last year  . Wound eschar of foot 07/2018   left heal getting wrapped and requiring antibiotic cream. cracks open with weight bearing.    HOSPITAL COURSE:  84 year old male with stage IV bladder cancer and bilateral PCNs who presented to the emergency room with bowel obstruction secondary to local effects of bladder tumor.   1.  Bowel obstruction: Patient is postoperative day #3.  Patient has colostomy bag in place and family has had teaching   2 locally advanced urothelial carcinoma  causing large bowel obstruction:  Patient evaluated by urology and oncology. Overall patient's performance status is very poor and not a candidate for chemotherapy at this time.  Family is aware of this as it is  patient.    3.  Bilateral PCN: Urine culture shows multiple species did recommend lab collection however patient has been ordered broad-spectrum antibiotics. UA this am with persistent UTI. Citrobacter and Pseudomonas in the past sensitive to ciprofloxacin which has been started.   4.  Hypokalemia: This is resolved  COVID testing negative   DISCHARGE CONDITIONS AND DIET:  Stable Regular diet  CONSULTS OBTAINED:  Treatment Team:  Billey Co, MD Sindy Guadeloupe, MD Herbert Pun, MD Bettey Costa, MD  DRUG ALLERGIES:  No Known Allergies  DISCHARGE MEDICATIONS:   Allergies as of 06/11/2019   No Known Allergies     Medication List    STOP taking these medications   Eliquis 5 MG Tabs tablet Generic drug: apixaban   metoprolol tartrate 25 MG tablet Commonly known as: LOPRESSOR     TAKE these medications   acetaminophen 500 MG tablet Commonly known as: TYLENOL Take 1,000 mg by mouth every 6 (six) hours as needed for moderate pain or headache.   cetirizine 10 MG tablet Commonly known as: ZYRTEC Take 10 mg by mouth daily as needed for allergies.   ciprofloxacin 500 MG tablet Commonly known as: CIPRO Take 1 tablet (500 mg total) by mouth 2 (two) times daily for 4 days.   feeding supplement (ENSURE ENLIVE) Liqd Take 237 mLs by mouth 2 (two) times daily between meals.   ferrous sulfate 325 (65 FE) MG tablet Take 1 tablet (325 mg total) by mouth 2 (two) times daily  with a meal.   lidocaine-prilocaine cream Commonly known as: EMLA Apply 1 application topically as needed.   multivitamin with minerals Tabs tablet Take 1 tablet by mouth daily.   Myrbetriq 50 MG Tb24 tablet Generic drug: mirabegron ER TAKE 1 TABLET BY MOUTH DAILY   nystatin powder Commonly known as: Nyamyc Apply topically 2 (two) times daily.   oxyCODONE 5 MG immediate release tablet Commonly known as: Oxy IR/ROXICODONE Take 1 tablet (5 mg total) by mouth every 4 (four) hours  as needed for severe pain.   polyethylene glycol powder 17 GM/SCOOP powder Commonly known as: MiraLax Take 17 g by mouth daily as needed. Can increase to 3 times a day as needed for constipation but hold medication if has diarrhea   Super Cranberry/Vitamin D3 4200-500 MG-UNIT Caps Generic drug: Cranberry-Cholecalciferol Take 1 capsule by mouth 2 (two) times daily.   vitamin B-12 500 MCG tablet Commonly known as: CYANOCOBALAMIN Take 500 mcg by mouth daily.   zinc oxide 20 % ointment Apply 1 application topically as needed for irritation.         Today   CHIEF COMPLAINT:  Tearful understanding his overall prognosis    VITAL SIGNS:  Blood pressure (!) 108/58, pulse 88, temperature 98.3 F (36.8 C), temperature source Oral, resp. rate 18, height 5\' 11"  (1.803 m), weight 77.5 kg, SpO2 99 %.   REVIEW OF SYSTEMS:  Review of Systems  Constitutional: Positive for malaise/fatigue. Negative for chills and fever.  HENT: Negative.  Negative for ear discharge, ear pain, hearing loss, nosebleeds and sore throat.   Eyes: Negative.  Negative for blurred vision and pain.  Respiratory: Negative.  Negative for cough, hemoptysis, shortness of breath and wheezing.   Cardiovascular: Negative.  Negative for chest pain, palpitations and leg swelling.  Gastrointestinal: Negative.  Negative for abdominal pain, blood in stool, diarrhea, nausea and vomiting.  Genitourinary: Negative.  Negative for dysuria.  Musculoskeletal: Negative.  Negative for back pain.  Skin: Negative.   Neurological: Negative for dizziness, tremors, speech change, focal weakness, seizures and headaches.  Endo/Heme/Allergies: Negative.  Does not bruise/bleed easily.  Psychiatric/Behavioral: Negative.  Negative for depression, hallucinations and suicidal ideas.     PHYSICAL EXAMINATION:  GENERAL:  84 y.o.-year-old patient lying in the bed with no acute distress.  NECK:  Supple, no jugular venous distention. No thyroid  enlargement, no tenderness.  LUNGS: Normal breath sounds bilaterally, no wheezing, rales,rhonchi  No use of accessory muscles of respiration.  CARDIOVASCULAR: S1, S2 normal. No murmurs, rubs, or gallops.  ABDOMEN: Soft, non-tender, non-distended. Bowel sounds present. No organomegaly or mass.  Colostomy bag in place EXTREMITIES: No pedal edema, cyanosis, or clubbing.  PSYCHIATRIC: The patient is alert and oriented x 3.  SKIN: No obvious rash, lesion, or ulcer.   DATA REVIEW:   CBC Recent Labs  Lab 06/09/19 0455  WBC 27.6*  HGB 9.2*  HCT 30.3*  PLT 471*    Chemistries  Recent Labs  Lab 06/09/19 0455  NA 137  K 4.1  CL 105  CO2 25  GLUCOSE 120*  BUN 23  CREATININE 1.24  CALCIUM 8.1*  MG 2.1  AST 9*  ALT <5  ALKPHOS 60  BILITOT 0.3    Cardiac Enzymes No results for input(s): TROPONINI in the last 168 hours.  Microbiology Results  @MICRORSLT48 @  RADIOLOGY:  No results found.    Allergies as of 06/11/2019   No Known Allergies     Medication List    STOP taking these  medications   Eliquis 5 MG Tabs tablet Generic drug: apixaban   metoprolol tartrate 25 MG tablet Commonly known as: LOPRESSOR     TAKE these medications   acetaminophen 500 MG tablet Commonly known as: TYLENOL Take 1,000 mg by mouth every 6 (six) hours as needed for moderate pain or headache.   cetirizine 10 MG tablet Commonly known as: ZYRTEC Take 10 mg by mouth daily as needed for allergies.   ciprofloxacin 500 MG tablet Commonly known as: CIPRO Take 1 tablet (500 mg total) by mouth 2 (two) times daily for 4 days.   feeding supplement (ENSURE ENLIVE) Liqd Take 237 mLs by mouth 2 (two) times daily between meals.   ferrous sulfate 325 (65 FE) MG tablet Take 1 tablet (325 mg total) by mouth 2 (two) times daily with a meal.   lidocaine-prilocaine cream Commonly known as: EMLA Apply 1 application topically as needed.   multivitamin with minerals Tabs tablet Take 1 tablet by  mouth daily.   Myrbetriq 50 MG Tb24 tablet Generic drug: mirabegron ER TAKE 1 TABLET BY MOUTH DAILY   nystatin powder Commonly known as: Nyamyc Apply topically 2 (two) times daily.   oxyCODONE 5 MG immediate release tablet Commonly known as: Oxy IR/ROXICODONE Take 1 tablet (5 mg total) by mouth every 4 (four) hours as needed for severe pain.   polyethylene glycol powder 17 GM/SCOOP powder Commonly known as: MiraLax Take 17 g by mouth daily as needed. Can increase to 3 times a day as needed for constipation but hold medication if has diarrhea   Super Cranberry/Vitamin D3 4200-500 MG-UNIT Caps Generic drug: Cranberry-Cholecalciferol Take 1 capsule by mouth 2 (two) times daily.   vitamin B-12 500 MCG tablet Commonly known as: CYANOCOBALAMIN Take 500 mcg by mouth daily.   zinc oxide 20 % ointment Apply 1 application topically as needed for irritation.         Management plans discussed with the patient and he is in agreement. Stable for discharge   Patient should follow up with pcp and oncology  CODE STATUS:     Code Status Orders  (From admission, onward)         Start     Ordered   06/07/19 1652  Do not attempt resuscitation (DNR)  Continuous    Question Answer Comment  In the event of cardiac or respiratory ARREST Do not call a "code blue"   In the event of cardiac or respiratory ARREST Do not perform Intubation, CPR, defibrillation or ACLS   In the event of cardiac or respiratory ARREST Use medication by any route, position, wound care, and other measures to relive pain and suffering. May use oxygen, suction and manual treatment of airway obstruction as needed for comfort.      06/07/19 1651        Code Status History    Date Active Date Inactive Code Status Order ID Comments User Context   06/07/2019 0103 06/07/2019 1651 Full Code [id number]  Mayer Camel, NP Inpatient   04/26/2019 0420 04/29/2019 1913 Full Code [id number]  Mansy, Arvella Merles, MD ED   04/08/2019  1211 04/10/2019 1628 Full Code [id number]  Gladstone Lighter, MD ED   03/22/2019 2337 03/26/2019 1934 Full Code [id number]  Lance Coon, MD Inpatient   02/21/2019 1505 02/26/2019 1849 Full Code [id number]  Mayo, Pete Pelt, MD Inpatient   11/12/2018 1849 11/16/2018 1632 Full Code [id number]  Epifanio Lesches, MD Inpatient   05/01/2018 1702 05/06/2018 1711 Full Code [id number]  Dustin Flock, MD Inpatient   12/26/2017 2141 12/30/2017 1727 Full Code [id number]  Demetrios Loll, MD Inpatient   05/31/2017 0208 06/01/2017 1922 Full Code [id number]  Harvie Bridge, DO Inpatient   03/04/2017 1833 03/05/2017 1426 Full Code [id number]  Idelle Crouch, MD Inpatient   07/11/2016 0538 07/13/2016 1420 Full Code [id number]  Saundra Shelling, MD ED   Advance Care Planning Activity    Advance Directive Documentation     Most Recent Value  Type of Advance Directive  Healthcare Power of Attorney  Pre-existing out of facility DNR order (yellow form or pink MOST form)  -  "MOST" Form in Place?  -      TOTAL TIME TAKING CARE OF THIS PATIENT: 38 minutes.    Note: This dictation was prepared with Dragon dictation along with smaller phrase technology. Any transcriptional errors that result from this process are unintentional.  Bettey Costa M.D on 06/11/2019 at 10:58 AM  Between 7am to 6pm - Pager - [phone] After 6pm go to [email or web address] - password [email or web address] [facility]  Office  [phone]  CC: Primary care physician; [name], MD

## 2019-06-11 NOTE — Progress Notes (Signed)
[facility] at [place] NAME: Vincent Poole    MR#:  [id number]  DATE OF BIRTH:  [birth year]  SUBJECTIVE:   patient tearful this am as he is realizing conditions of his cancer Denies abdominal pain and is tolerating diet REVIEW OF SYSTEMS:    Review of Systems  Constitutional: Negative for fever, chills weight loss HENT: Negative for ear pain, nosebleeds, congestion, facial swelling, rhinorrhea, neck pain, neck stiffness and ear discharge.   Hard of hearing Respiratory: Negative for cough, shortness of breath, wheezing  Cardiovascular: Negative for chest pain, palpitations and leg swelling.  Gastrointestinal: Negative for heartburn, abdominal pain, vomiting, diarrhea or consitpation Genitourinary: Negative for dysuria, urgency, frequency, hematuria Musculoskeletal: Negative for back pain or joint pain Neurological: Negative for dizziness, seizures, syncope, focal weakness,  numbness and headaches.  Hematological: Does not bruise/bleed easily.  Psychiatric/Behavioral: Negative for hallucinations, confusion,     Tolerating Diet: yes      DRUG ALLERGIES:  No Known Allergies  VITALS:  Blood pressure (!) 108/58, pulse 88, temperature 98.3 F (36.8 C), temperature source Oral, resp. rate 18, height 5\' 11"  (1.803 m), weight 77.5 kg, SpO2 99 %.  PHYSICAL EXAMINATION:  Constitutional: Appears well-developed and well-nourished. No distress. HENT: Normocephalic. Marland Kitchen Oropharynx is clear and moist.  Eyes: Conjunctivae and EOM are normal. PERRLA, no scleral icterus.  Neck: Normal ROM. Neck supple. No JVD. No tracheal deviation. CVS: RRR, S1/S2 +, no murmurs, no gallops, no carotid bruit.  Pulmonary: Effort and breath sounds normal, no stridor, rhonchi, wheezes, rales.  Abdominal: Patient has colostomy bag.  No tenderness, rebound or guarding.  No distention of abdomen.   Musculoskeletal: Normal range of motion.  1+ lower extremity edema and no  tenderness.  Neuro: Alert. CN 2-12 grossly intact. No focal deficits. Skin: Skin is warm and dry. No rash noted. Psychiatric: Tearful this morning     LABORATORY PANEL:   CBC Recent Labs  Lab 06/09/19 0455  WBC 27.6*  HGB 9.2*  HCT 30.3*  PLT 471*   ------------------------------------------------------------------------------------------------------------------  Chemistries  Recent Labs  Lab 06/09/19 0455  NA 137  K 4.1  CL 105  CO2 25  GLUCOSE 120*  BUN 23  CREATININE 1.24  CALCIUM 8.1*  MG 2.1  AST 9*  ALT <5  ALKPHOS 60  BILITOT 0.3   ------------------------------------------------------------------------------------------------------------------  Cardiac Enzymes No results for input(s): TROPONINI in the last 168 hours. ------------------------------------------------------------------------------------------------------------------  RADIOLOGY:  No results found.   ASSESSMENT AND PLAN:   84 year old male with stage IV bladder cancer and bilateral PCNs who presented to the emergency room with bowel obstruction secondary to local effects of bladder tumor.   1.  Bowel obstruction: Patient is postoperative day #3.  Patient has colostomy bag in place and family has had teaching   2 locally advanced urothelial carcinoma  causing large bowel obstruction:  Patient evaluated by urology and oncology. Overall patient's performance status is very poor and not a candidate for chemotherapy at this time.  Family is aware of this as it is patient. Palliative care meeting today   3.  Bilateral PCN: Urine culture shows multiple species did recommend lab collection however patient has been ordered broad-spectrum antibiotics. UA this am with persistent UTI. Citrobacter and Pseudomonas in the past sensitive to ciprofloxacin which has been started.   4.  Hypokalemia: This is resolved  Palliative care meeting today.  Patient may need hospice services at home if he  agrees.  Management plans discussed with the patient and wife is in agreement.  CODE STATUS: dnr  TOTAL TIME TAKING CARE OF THIS PATIENT: 29 minutes.     POSSIBLE D/C today DEPENDING ON CLINICAL CONDITION.   Bettey Costa M.D on 06/11/2019 at 10:20 AM  Between 7am to 6pm - Pager - [phone] After 6pm go to [email or web address] - password [email or web address] [facility]  Office  [phone]  CC: Primary care physician; [name], MD  Note: This dictation was prepared with Dragon dictation along with smaller phrase technology. Any transcriptional errors that result from this process are unintentional.

## 2019-06-11 NOTE — Progress Notes (Signed)
Hematology/Oncology Consult note [facility] ([facility])  Telephone:([phone] [phone]  Patient Care Team: [name], MD as PCP - General (Family Medicine)   Name of the patient: Vincent Poole  [id number]  August 31, 1935   Date of visit: 06/10/19  Interval history-I met with patient and his wife at the bedside today.  Patient resting comfortably.  He is extremely hard of hearing but does communicate when something is written down to him and he denies any pain.  He was not able to get out of bed today  Review of systems- Review of Systems  Constitutional: Positive for malaise/fatigue. Negative for chills, fever and weight loss.  HENT: Negative for congestion, ear discharge and nosebleeds.   Eyes: Negative for blurred vision.  Respiratory: Negative for cough, hemoptysis, sputum production, shortness of breath and wheezing.   Cardiovascular: Negative for chest pain, palpitations, orthopnea and claudication.  Gastrointestinal: Negative for abdominal pain, blood in stool, constipation, diarrhea, heartburn, melena, nausea and vomiting.  Genitourinary: Negative for dysuria, flank pain, frequency, hematuria and urgency.  Musculoskeletal: Negative for back pain, joint pain and myalgias.  Skin: Negative for rash.  Neurological: Negative for dizziness, tingling, focal weakness, seizures, weakness and headaches.  Endo/Heme/Allergies: Does not bruise/bleed easily.  Psychiatric/Behavioral: Negative for depression and suicidal ideas. The patient does not have insomnia.        No Known Allergies   Past Medical History:  Diagnosis Date   Anxiety    Arthritis    Bladder cancer ([place])    Dysrhythmia 07/2018   history of atrial flutter that worsens with anxiety   Femur fracture, right ([place]) 05/01/2018   GERD (gastroesophageal reflux disease)    History of recent blood transfusion 05/2018   Hypertension    Iron deficiency anemia 09/14/2018   Prostate  cancer ([place]) 07/2018   cancer growing in prostate but not prostate cancer, it is from the bladder   Umbilical hernia 37/1696   Urinary retention 2019   foley catheter place 11/2017   UTI (urinary tract infection) 2019   frequent UTI's over last year   Wound eschar of foot 07/2018   left heal getting wrapped and requiring antibiotic cream. cracks open with weight bearing.     Past Surgical History:  Procedure Laterality Date   CARPAL TUNNEL RELEASE Right    CHOLECYSTECTOMY  2004   COLOSTOMY N/A 06/08/2019   Procedure: COLOSTOMY;  Surgeon: Herbert Pun, MD;  Location: [facility];  Service: General;  Laterality: N/A;   CYSTOGRAM  07/18/2018   Procedure: CYSTOGRAM;  Surgeon: [name], MD;  Location: [facility];  Service: Urology;;   Consuela Mimes W/ RETROGRADES Bilateral 07/18/2018   Procedure: CYSTOSCOPY WITH RETROGRADE PYELOGRAM;  Surgeon: [name], MD;  Location: [facility];  Service: Urology;  Laterality: Bilateral;   CYSTOSCOPY W/ URETERAL STENT PLACEMENT Bilateral 12/27/2017   Procedure: CYSTOSCOPY WITH RETROGRADE PYELOGRAM/URETERAL STENT PLACEMENT;  Surgeon: [name], MD;  Location: [facility];  Service: Urology;  Laterality: Bilateral;   CYSTOSCOPY W/ URETERAL STENT PLACEMENT Bilateral 07/18/2018   Procedure: CYSTOSCOPY WITH STENT REPLACEMENT (exchange);  Surgeon: [name], MD;  Location: [facility];  Service: Urology;  Laterality: Bilateral;   CYSTOSCOPY WITH STENT PLACEMENT Bilateral 11/12/2018   Procedure: [place] WITH STENT Exchange;  Surgeon: [name], MD;  Location: [facility];  Service: Urology;  Laterality: Bilateral;   INTRAMEDULLARY (IM) NAIL INTERTROCHANTERIC Right 05/02/2018   Procedure: INTRAMEDULLARY (IM) NAIL INTERTROCHANTRIC;  Surgeon: [name], MD;  Location: [facility];  Service: Orthopedics;  Laterality: Right;   IR NEPHROSTOMY EXCHANGE LEFT  03/25/2019   IR NEPHROSTOMY EXCHANGE LEFT  04/19/2019   IR NEPHROSTOMY EXCHANGE  LEFT  05/31/2019   IR NEPHROSTOMY EXCHANGE RIGHT  03/25/2019   IR NEPHROSTOMY EXCHANGE RIGHT  04/19/2019   IR NEPHROSTOMY EXCHANGE RIGHT  05/31/2019   IR NEPHROSTOMY PLACEMENT LEFT  02/23/2019   IR NEPHROSTOMY PLACEMENT RIGHT  02/23/2019   LAPAROTOMY N/A 06/08/2019   Procedure: EXPLORATORY LAPAROTOMY;  Surgeon: Herbert Pun, MD;  Location: [facility];  Service: General;  Laterality: N/A;   LEG TENDON SURGERY Right 1958   PORTA CATH INSERTION N/A 01/22/2018   Procedure: PORTA CATH INSERTION;  Surgeon: [name], MD;  Location: [facility] [facility];  Service: Cardiovascular;  Laterality: N/A;   TRANSURETHRAL RESECTION OF BLADDER TUMOR N/A 12/27/2017   Procedure: TRANSURETHRAL RESECTION OF BLADDER TUMOR (TURBT);  Surgeon: [name], MD;  Location: [facility];  Service: Urology;  Laterality: N/A;   TRANSURETHRAL RESECTION OF BLADDER TUMOR N/A 01/15/2018   Procedure: TRANSURETHRAL RESECTION OF BLADDER TUMOR (TURBT);  Surgeon: [name], MD;  Location: [facility];  Service: Urology;  Laterality: N/A;  Need 2 hrs for this case please   TRANSURETHRAL RESECTION OF BLADDER TUMOR N/A 07/18/2018   Procedure: TRANSURETHRAL RESECTION OF BLADDER TUMOR (TURBT);  Surgeon: [name], MD;  Location: [facility];  Service: Urology;  Laterality: N/A;    Social History   Socioeconomic History   Marital status: Married    Spouse name: ruth   Number of children: Not on file   Years of education: Not on file   Highest education level: Not on file  Occupational History   Occupation: retired    Comment: Barista strain: Patient refused   Food insecurity    Worry: Patient refused    Inability: Patient refused   Transportation needs    Medical: Patient refused    Non-medical: Patient refused  Tobacco Use   Smoking status: Never Smoker   Smokeless tobacco: Never Used  Substance and Sexual Activity   Alcohol use: No    Alcohol/week:  0.0 standard drinks   Drug use: No   Sexual activity: Not Currently  Lifestyle   Physical activity    Days per week: Patient refused    Minutes per session: Patient refused   Stress: Patient refused  Relationships   Social connections    Talks on phone: Patient refused    Gets together: Patient refused    Attends religious service: Patient refused    Active member of club or organization: Patient refused    Attends meetings of clubs or organizations: Patient refused    Relationship status: Patient refused   Intimate partner violence    Fear of current or ex partner: Patient refused    Emotionally abused: Patient refused    Physically abused: Patient refused    Forced sexual activity: Patient refused  Other Topics Concern   Not on file  Social History Narrative   Not on file    Family History  Problem Relation Age of Onset   Cancer Mother    Chronic Renal Failure Mother    Heart disease Father      Current Facility-Administered Medications:    acetaminophen (TYLENOL) tablet 650 mg, 650 mg, Oral, Q6H PRN, Seals, Angela H, NP, 650 mg at 06/09/19 2119   Chlorhexidine Gluconate Cloth 2 % PADS 6 each, 6 each, Topical, Daily, Tukov-Yual, Magdalene S,  NP, 6 each at 06/11/19 0809   enoxaparin (LOVENOX) injection 40 mg, 40 mg, Subcutaneous, Q24H, Cintron-Diaz, Edgardo, MD, 40 mg at 06/10/19 2043   feeding supplement (ENSURE ENLIVE) (ENSURE ENLIVE) liquid 237 mL, 237 mL, Oral, BID BM, Sudini, Srikar, MD, 237 mL at 06/11/19 0809   HYDROmorphone (DILAUDID) injection 0.5 mg, 0.5 mg, Intravenous, Q4H PRN, Herbert Pun, MD, 0.5 mg at 06/10/19 1607   ondansetron (ZOFRAN) tablet 4 mg, 4 mg, Oral, Q6H PRN **OR** ondansetron (ZOFRAN) injection 4 mg, 4 mg, Intravenous, Q6H PRN, Windell Moment, Edgardo, MD   pantoprazole (PROTONIX) injection 40 mg, 40 mg, Intravenous, Q24H, Tukov-Yual, Magdalene S, NP, 40 mg at 06/11/19 0556   sodium chloride flush (NS) 0.9 % injection  3 mL, 3 mL, Intravenous, Q12H, Cintron-Diaz, Reeves Forth, MD, 3 mL at 06/11/19 0809  Physical exam:  Vitals:   06/10/19 1510 06/10/19 2139 06/11/19 0421 06/11/19 0657  BP:  (!) 104/53 (!) 101/50 (!) 108/58  Pulse: 99 95 88 88  Resp:  18 18   Temp:  98.8 F (37.1 C) 98.3 F (36.8 C)   TempSrc:  Oral Oral   SpO2:  96% 98% 99%  Weight:      Height:       Physical Exam Constitutional:      Comments: Frail elderly gentleman who appears in no acute distress  HENT:     Head: Normocephalic and atraumatic.  Eyes:     Pupils: Pupils are equal, round, and reactive to light.  Neck:     Musculoskeletal: Normal range of motion.  Cardiovascular:     Rate and Rhythm: Normal rate and regular rhythm.     Heart sounds: Normal heart sounds.  Pulmonary:     Effort: Pulmonary effort is normal.     Breath sounds: Normal breath sounds.  Abdominal:     General: Bowel sounds are normal.     Palpations: Abdomen is soft.     Comments: Diverting colostomy in place with adequate stool output.  Bilateral nephrostomy tubes in place  Skin:    General: Skin is warm and dry.  Neurological:     Mental Status: He is alert and oriented to person, place, and time.      CMP Latest Ref Rng & Units 06/09/2019  Glucose 70 - 99 mg/dL 120(H)  BUN 8 - 23 mg/dL 23  Creatinine 0.61 - 1.24 mg/dL 1.24  Sodium 135 - 145 mmol/L 137  Potassium 3.5 - 5.1 mmol/L 4.1  Chloride 98 - 111 mmol/L 105  CO2 22 - 32 mmol/L 25  Calcium 8.9 - 10.3 mg/dL 8.1(L)  Total Protein 6.5 - 8.1 g/dL 5.4(L)  Total Bilirubin 0.3 - 1.2 mg/dL 0.3  Alkaline Phos 38 - 126 U/L 60  AST 15 - 41 U/L 9(L)  ALT 0 - 44 U/L <5   CBC Latest Ref Rng & Units 06/09/2019  WBC 4.0 - 10.5 K/uL 27.6(H)  Hemoglobin 13.0 - 17.0 g/dL 9.2(L)  Hematocrit 39.0 - 52.0 % 30.3(L)  Platelets 150 - 400 K/uL 471(H)    _0 @  Ct Abdomen Pelvis W Contrast  Result Date: 06/06/2019 CLINICAL DATA:  Patient c/o abdominal pain, emesis, constipation, weakness. Patient  is a cancer patient with weekly treatments in the cancer center (unsure if chemo). Patient has bilateral nephrostomy tubes. EXAM: CT ABDOMEN AND PELVIS WITH CONTRAST TECHNIQUE: Multidetector CT imaging of the abdomen and pelvis was performed using the standard protocol following bolus administration of intravenous contrast. CONTRAST:  157m OMNIPAQUE IOHEXOL 300 MG/ML  SOLN COMPARISON:  CT of the abdomen and pelvis on 04/16/2019, 02/21/2019 FINDINGS: Lower chest: Extensive coronary artery calcification. Pericardial thickening or small effusion. Small hiatal hernia. Hepatobiliary: Cholecystectomy. Liver is otherwise unremarkable. Pancreas: Atrophic pancreas normal in appearance. Spleen: Normal in size without focal abnormality. Adrenals/Urinary Tract: Adrenal glands are normal. Bilateral percutaneous nephrostomy catheters. Stable RIGHT renal cyst. Ureters are decompressed. Irregular soft tissue thickening of the urinary bladder wall, associated with surrounding inflammatory changes. There is a broad interface with the anterior abdominal wall, suspicious for involvement of the anterior abdominal wall. Stomach/Bowel: Hiatal hernia. Stomach is unremarkable. Small bowel loops are not dilated. The appendix is distended and contains appendicoliths. Appendix measures 13 millimeters in thickness. There is new, significant dilatation of the colon. Cecum measures 9.0 centimeters. The colon is dilated and stool filled throughout its course to level of the descending colon where there is a beak adjacent to the bladder mass, best seen on image 32/5. This abrupt transition zone is consistent with malignant adhesion or mass effects and causing significant obstruction. A second possible narrowing is also identified within the sigmoid colon best seen on coronal image 19/5. There is no evidence for free intraperitoneal air. Vascular/Lymphatic: Dense atherosclerotic calcifications of the abdominal aorta. No aneurysm. Reproductive:  Prostatic calcifications. Previous TURP. Other: No ascites. Small amount of fluid within the RIGHT inguinal canal. Musculoskeletal: Degenerative changes are seen spine and hips, RIGHT greater than LEFT. Previous ORIF of the RIGHT hip. Significant degenerative changes and postoperative IMPRESSION: 1. Large bowel obstruction likely related to bladder tumor. Sharp transition zone in the LOWER descending colon is consistent with malignant adhesion, direct invasion by the tumor, or mass effect from the tumor. Cecum measures up to 9 centimeters. 2. Suspect involvement of the anterior abdominal wall by bladder tumor. 3. No evidence for free intraperitoneal air. 4. Bilateral percutaneous nephrostomy catheters. 5. Hiatal hernia. 6. Coronary artery disease. 7. Previous TURP. 8. Aortic Atherosclerosis (ICD10-I70.0). These results were called by telephone at the time of interpretation on 06/06/2019 at 8:58 pm to Dr. Marjean Donna , who verbally acknowledged these results. Electronically Signed   By: Nolon Nations M.D.   On: 06/06/2019 21:00   Ir Nephrostomy Exchange Left  Result Date: 05/31/2019 INDICATION: Routine exchange EXAM: FLUOROSCOPIC GUIDED BILATERAL SIDED NEPHROSTOMY CATHETER EXCHANGE COMPARISON:  Multiple previous fluoroscopic guided exchanges most recently on 04/19/2019 and 03/25/2019. CONTRAST:  A total of 20 mL Isovue-300 administered was administered into both collecting systems FLUOROSCOPY TIME:  36 seconds (9.5 mGy) COMPLICATIONS: None immediate. TECHNIQUE: Informed written consent was obtained from the patient after a discussion of the risks, benefits and alternatives to treatment. Questions regarding the procedure were encouraged and answered. A timeout was performed prior to the initiation of the procedure. The bilateral flanks and external portions of existing nephrostomy catheters were prepped and draped in the usual sterile fashion. A sterile drape was applied covering the operative field. Maximum  barrier sterile technique with sterile gowns and gloves were used for the procedure. A timeout was performed prior to the initiation of the procedure. A pre procedural spot fluoroscopic image was obtained. Beginning with the left-sided nephrostomy, a small amount of contrast was injected via the existing left-sided nephrostomy catheter demonstrating appropriate positioning within the renal pelvis. The existing nephrostomy catheter was cut and cannulated with a Benson wire which was coiled within the renal pelvis. Under intermittent fluoroscopic guidance, the existing nephrostomy catheter was exchanged for a new 12 [place] all-purpose drainage catheter. Limited contrast injection confirmed appropriate positioning  within the left renal pelvis and a post exchange fluoroscopic image was obtained. The catheter was locked, secured to the skin with an interrupted suture and reconnected to a gravity bag. The identical repeat procedure was repeated for the contralateral right-sided nephrostomy, ultimately allowing successful exchange of a new 12 [place] all-purpose drainage catheter with end coiled and locked within the right renal pelvis. Dressings were placed. The patient tolerated the above procedures well without immediate postprocedural complication. FINDINGS: The existing nephrostomy catheters are appropriately positioned and functioning. After successful fluoroscopic guided exchange, new bilateral 12 French nephrostomy catheters are coiled and locked within the respective renal pelvises. IMPRESSION: Successful fluoroscopic guided exchange of bilateral 12 French percutaneous nephrostomy catheters. Electronically Signed   By: Sandi Mariscal M.D.   On: 05/31/2019 15:01   Ir Nephrostomy Exchange Right  Result Date: 05/31/2019 INDICATION: Routine exchange EXAM: FLUOROSCOPIC GUIDED BILATERAL SIDED NEPHROSTOMY CATHETER EXCHANGE COMPARISON:  Multiple previous fluoroscopic guided exchanges most recently on 04/19/2019 and  03/25/2019. CONTRAST:  A total of 20 mL Isovue-300 administered was administered into both collecting systems FLUOROSCOPY TIME:  36 seconds (9.5 mGy) COMPLICATIONS: None immediate. TECHNIQUE: Informed written consent was obtained from the patient after a discussion of the risks, benefits and alternatives to treatment. Questions regarding the procedure were encouraged and answered. A timeout was performed prior to the initiation of the procedure. The bilateral flanks and external portions of existing nephrostomy catheters were prepped and draped in the usual sterile fashion. A sterile drape was applied covering the operative field. Maximum barrier sterile technique with sterile gowns and gloves were used for the procedure. A timeout was performed prior to the initiation of the procedure. A pre procedural spot fluoroscopic image was obtained. Beginning with the left-sided nephrostomy, a small amount of contrast was injected via the existing left-sided nephrostomy catheter demonstrating appropriate positioning within the renal pelvis. The existing nephrostomy catheter was cut and cannulated with a Benson wire which was coiled within the renal pelvis. Under intermittent fluoroscopic guidance, the existing nephrostomy catheter was exchanged for a new 12 [place] all-purpose drainage catheter. Limited contrast injection confirmed appropriate positioning within the left renal pelvis and a post exchange fluoroscopic image was obtained. The catheter was locked, secured to the skin with an interrupted suture and reconnected to a gravity bag. The identical repeat procedure was repeated for the contralateral right-sided nephrostomy, ultimately allowing successful exchange of a new 12 [place] all-purpose drainage catheter with end coiled and locked within the right renal pelvis. Dressings were placed. The patient tolerated the above procedures well without immediate postprocedural complication. FINDINGS: The existing nephrostomy  catheters are appropriately positioned and functioning. After successful fluoroscopic guided exchange, new bilateral 12 French nephrostomy catheters are coiled and locked within the respective renal pelvises. IMPRESSION: Successful fluoroscopic guided exchange of bilateral 12 French percutaneous nephrostomy catheters. Electronically Signed   By: Sandi Mariscal M.D.   On: 05/31/2019 15:01     Assessment and plan- Patient is a 84 y.o. male with locally advanced urothelial carcinoma most recently on Tecentriq presenting with large bowel obstruction secondary to progression of bladder cancer involving the large bowel status post diverting colostomy  Patient tolerated the diverting colostomy procedure well.  His abdominal distention has now resolved and there is adequate stool output in his colostomy bag.  He is still significantly deconditioned and did not participate with physical therapy today.  Patient's wife hopes that he can work with physical therapy over the next couple of days and get stronger to be able  to go home.  She would very much like to care for him at home.  Patient and his wife understand that overall patient's performance status is poor and he is not a candidate for chemotherapy at this time.  Patient was diagnosed with bladder cancer back in February 2019 and has tolerated treatment relatively well so far.  But that may not be the case moving forward.  We discussed his overall goals at this time and family is not quite ready to go home with hospice and they would like to take 1 day at a time and more interested in home health or home palliative care.  I will continue to address goals of care as an outpatient.  He is a DNR   Visit Diagnosis: Goals of care counseling/discussion   Dr. Randa Evens, MD, MPH [facility] at [facility] [phone] 06/11/2019 8:27 AM

## 2019-06-11 NOTE — TOC Initial Note (Addendum)
Transition of Care [facility]) - Initial/Assessment Note    Patient Details  Name: Vincent Poole MRN: [id number] Date of Birth: 01-04-1935  Transition of Care [facility]) CM/SW Contact:    Candie Chroman, LCSW Phone Number: 06/11/2019, 1:20 PM  Clinical Narrative: CSW met with patient and wife, introduced role, and explained that discharge planning would be discussed. Patient and wife now want SNF placement at [facility]. His niece is a nurse there and he was there last summer. CSW sent referral and asked admissions coordinator to review. No further concerns. CSW encouraged patient and his wife to contact CSW as needed. CSW will continue to follow patient and his wife for support and facilitate discharge to SNF once bed available and insurance authorization obtained.    3:11 pm: [facility] has made bed offer and started insurance authorization. Sent MD a message requesting new COVID test.              Expected Discharge Plan: Skilled Nursing Facility Barriers to Discharge: SNF Pending bed offer, Insurance Authorization   Patient Goals and CMS Choice        Expected Discharge Plan and Services Expected Discharge Plan: [place] Choice: [place] Living arrangements for the past 2 months: Single Family Home Expected Discharge Date: 06/11/19                                    Prior Living Arrangements/Services Living arrangements for the past 2 months: Single Family Home Lives with:: Spouse Patient language and need for interpreter reviewed:: Yes Do you feel safe going back to the place where you live?: Yes      Need for Family Participation in Patient Care: Yes (Comment) Care giver support system in place?: Yes (comment)   Criminal Activity/Legal Involvement Pertinent to Current Situation/Hospitalization: No - Comment as needed  Activities of Daily Living Home Assistive Devices/Equipment: Gilford Rile (specify type) ADL  Screening (condition at time of admission) Patient's cognitive ability adequate to safely complete daily activities?: No Is the patient deaf or have difficulty hearing?: Yes Does the patient have difficulty seeing, even when wearing glasses/contacts?: Yes Does the patient have difficulty concentrating, remembering, or making decisions?: No Patient able to express need for assistance with ADLs?: Yes Does the patient have difficulty dressing or bathing?: Yes Independently performs ADLs?: No Communication: Independent Dressing (OT): Needs assistance Is this a change from baseline?: Pre-admission baseline Grooming: Needs assistance Is this a change from baseline?: Pre-admission baseline Feeding: Independent Bathing: Needs assistance Is this a change from baseline?: Pre-admission baseline Toileting: Independent with device (comment) In/Out Bed: Needs assistance Is this a change from baseline?: Pre-admission baseline Walks in Home: Independent with device (comment) Does the patient have difficulty walking or climbing stairs?: Yes Weakness of Legs: Both Weakness of Arms/Hands: None  Permission Sought/Granted Permission sought to share information with : Facility Sport and exercise psychologist, Family Supports Permission granted to share information with : Yes, Verbal Permission Granted  Share Information with NAME: Bristol Soy  Permission granted to share info w AGENCY: [facility]  Permission granted to share info w Relationship: Wife  Permission granted to share info w Contact Information: [phone]  Emotional Assessment Appearance:: Appears stated age Attitude/Demeanor/Rapport: Engaged, Gracious Affect (typically observed): Accepting, Appropriate, Calm, Pleasant Orientation: : Oriented to Self, Oriented to Place, Oriented to  Time, Oriented to Situation Alcohol / Substance Use: Never  Used Psych Involvement: No (comment)  Admission diagnosis:  Vomiting  cough  weak Patient Active  Problem List   Diagnosis Date Noted  . Protein-calorie malnutrition, severe 06/11/2019  . Abdominal pain, generalized   . Palliative care by specialist   . Urothelial cancer ([place])   . Weakness   . Large bowel obstruction ([place]) 06/06/2019  . Hydronephrosis, right 04/08/2019  . Pressure injury of skin 03/23/2019  . SIRS (systemic inflammatory response syndrome) ([place]) 03/22/2019  . Leukocytosis 02/21/2019  . Complicated UTI (urinary tract infection) 11/14/2018  . Palliative care encounter   . Iron deficiency anemia 09/14/2018  . Hip fracture ([place]) 05/01/2018  . Malignant neoplasm of urinary bladder ([place]) 01/12/2018  . Goals of care, counseling/discussion 01/12/2018  . Pyelonephritis 05/31/2017  . UTI (urinary tract infection) 03/04/2017  . Urinary obstruction   . Essential hypertension 08/30/2016  . History of shingles 08/30/2016  . Prostate cancer ([place]) 08/30/2016  . Urinary retention 08/30/2016  . Medicare annual wellness visit, initial 08/01/2016  . Medicare annual wellness visit, subsequent 08/01/2016  . Sepsis ([place]) 07/11/2016  . Borderline diabetes mellitus 01/26/2016  . Vaccine counseling 01/26/2015  . Lumbar radiculitis 06/24/2014  . OA (osteoarthritis) of hip 06/24/2014  . Malignant neoplasm of prostate ([place]) 01/27/2011   PCP:  [name], MD Pharmacy:   [place], [place] [address] [address] Phone: [phone] Fax: [phone]  [place], [place] [address] [place] [address] Phone: [phone] Fax: [phone]     Social Determinants of Health (SDOH) Interventions    Readmission Risk Interventions Readmission Risk Prevention Plan 04/28/2019 03/26/2019 03/24/2019  Transportation Screening Complete - Complete  PCP or Specialist Appt within 3-5 Days Not Complete - -  HRI or Home Care Consult Complete - Complete  Social Work Consult for [place]  Planning/Counseling Patient refused - -  Palliative Care Screening - Complete -  Medication Review Press photographer) Complete - -  Some recent data might be hidden

## 2019-06-11 NOTE — Progress Notes (Signed)
[place] Hospital Day(s): 5.   Post op day(s): 3 Days Post-Op.   Interval History: Patient seen and examined, no acute events or new complaints overnight. Patient reports feeling well.  He reports that today he has more appetite and had a better breakfast.  Denies abdominal pain this morning.  Denies nausea or vomiting.  The colostomy is working properly.  Vital signs in last 24 hours: [min-max] current  Temp:  [98.1 F (36.7 C)-98.8 F (37.1 C)] 98.3 F (36.8 C) (08/04 0421) Pulse Rate:  [88-109] 88 (08/04 0657) Resp:  [18] 18 (08/04 0421) BP: (101-121)/(50-58) 108/58 (08/04 0657) SpO2:  [96 %-99 %] 99 % (08/04 0657)     Height: 5\' 11"  (180.3 cm) Weight: 77.5 kg BMI (Calculated): 23.84   Physical Exam:  Constitutional: alert, cooperative and no distress  Respiratory: breathing non-labored at rest  Cardiovascular: regular rate and sinus rhythm  Gastrointestinal: soft, non-tender, and non-distended. Pink and patent  Labs:  CBC Latest Ref Rng & Units 06/09/2019 06/08/2019 06/08/2019  WBC 4.0 - 10.5 K/uL 27.6(H) 23.5(H) 22.9(H)  Hemoglobin 13.0 - 17.0 g/dL 9.2(L) 8.2(L) 8.5(L)  Hematocrit 39.0 - 52.0 % 30.3(L) 26.6(L) 27.0(L)  Platelets 150 - 400 K/uL 471(H) 324 353   CMP Latest Ref Rng & Units 06/09/2019 06/08/2019 06/07/2019  Glucose 70 - 99 mg/dL 120(H) 110(H) 88  BUN 8 - 23 mg/dL 23 20 20   Creatinine 0.61 - 1.24 mg/dL 1.24 1.15 1.16  Sodium 135 - 145 mmol/L 137 137 138  Potassium 3.5 - 5.1 mmol/L 4.1 4.3 2.9(L)  Chloride 98 - 111 mmol/L 105 106 105  CO2 22 - 32 mmol/L 25 21(L) 24  Calcium 8.9 - 10.3 mg/dL 8.1(L) 8.2(L) 8.3(L)  Total Protein 6.5 - 8.1 g/dL 5.4(L) - -  Total Bilirubin 0.3 - 1.2 mg/dL 0.3 - -  Alkaline Phos 38 - 126 U/L 60 - -  AST 15 - 41 U/L 9(L) - -  ALT 0 - 44 U/L <5 - -    Imaging studies: No new pertinent imaging studies   Assessment/Plan:  84 y.o.malewith large bowel obstructionstatus post transverse loop colostomy POD4,  complicated by pertinent comorbidities includingbladder cancer with metastases that has not responded to immunotherapy, hypertension, hydronephrosis, severe weakness. Patient recovering well from surgery. Continue with adequate ostomy output and gas. Tolerating solid diet. Wife again expressing that "the patient is not ready for hospice".  She also reports that she does not understand why the patient is deteriorating so fast.  I discussed with her that this type of cancer can progress rapidly.  I emphasized that the most important thing at this moment is to give him the best quality of life and comfort measures for the patient.  I considered that the patient looks pretty comfortable.  I think that the family are the ones are now ready for hospice.  I defer this conversation for palliative and oncology service to continue orienting the family about the patient's situation and the realistic goals for him.  I think that they have been well oriented in the past but did not want to understand the situation with the patient.  Again I think that the patient does understand and accept the situation and feels comfortable.  Today he was happy that he was able to eat more.  Arnold Long, MD

## 2019-06-11 NOTE — Evaluation (Signed)
Physical Therapy Re-Evaluation Patient Details Name: Vincent Poole MRN: [id number] DOB: June 15, 1935 Today's Date: 06/11/2019   History of Present Illness  84 y.o. male with a known history of bladder cancer with bilateral indwelling nephrostomies who has had multiple recent hosptializations.  Relevant PMH includes DVT,  GERD, anxiety, arthrits, history of atrial flutter, R femur fracture, HTN, prostate cancer, umbilical hernia, wound eschar L of foot, urinary catheter, multi bladder surgeries/procedures, admitted for sepsis/UTI. Now  locally advanced urothelial carcinoma most recently on [place] presenting with large bowel obstruction secondary to progression of bladder cancer involving the large bowel status post diverting colostomy 06/08/2019.  Clinical Impression  Prior to hospital admission, pt need assistant with ADLs (see details below).  Pt lives with his wife in a mobile home and son with daughter in law lives nearby can provide 24/7 help. 3 steps with L side railing to entry with BUE support going sideway.  Currently pt is +1 Mod for bed mobility supine <> sitting on EOB. Heavy using of tactile cuing and hand held assistant for UE and LE assistant. +2 Mod hand held assist to stand from sitting on EOB. Pt standing with posterior leaning due to B feet lack of dorsiflexion. Pt able to tolerate standing for 20s and pt requests to sit down due to fatigue. Pt refused ambulation and request to go back to bed to rest. Pt is very HOH and only communicated with write on white board. Pt states pain is 5/10 to and change to 6/10 at the end of the session. Pt's home situation gathered from his wife at bedside under pt's permission. Pt would benefit from skilled PT to address noted impairments and functional limitations (see below for any additional details).  Upon hospital discharge, recommend pt discharge to SNF to improve ROM, strength, and functional mobility to ensure a safe return home.    Follow Up  Recommendations SNF;Supervision/Assistance - 24 hour    Equipment Recommendations  Rolling walker with 5" wheels    Recommendations for Other Services       Precautions / Restrictions Precautions Precautions: Fall Precaution Comments: Bilateral nephrostomies; ostomy at anterior abdominal. Restrictions Weight Bearing Restrictions: No      Mobility  Bed Mobility Overal bed mobility: Needs Assistance Bed Mobility: Supine to Sit;Sit to Supine     Supine to sit: Mod assist Sit to supine: Mod assist   General bed mobility comments: Mod A with hand held assist with BUE/BLE to sit from supiue heavy using of rails.  Transfers Overall transfer level: Needs assistance Equipment used: Rolling walker (2 wheeled) Transfers: Sit to/from Stand Sit to Stand: Mod assist;+2 physical assistance         General transfer comment: +2 hand held assist for standing. Pt able to tolerate standing for 20s and request to sit down due to fatigue and increase of pain. Lack of dorsiflexion noted.  Ambulation/Gait             General Gait Details: Deffered due to pt refusing.  Stairs            Wheelchair Mobility    Modified Rankin (Stroke Patients Only)       Balance Overall balance assessment: Needs assistance Sitting-balance support: Bilateral upper extremity supported Sitting balance-Leahy Scale: Fair Sitting balance - Comments: Pt able to maintain sitting EOB balance w/o direct assist   Standing balance support: Bilateral upper extremity supported Standing balance-Leahy Scale: Poor Standing balance comment: Require at least +2 hand held Mod A to maintain  standing balance.                             Pertinent Vitals/Pain Pain Assessment: 0-10 Pain Score: 5  Pain Location: Anterior abdominal Pain Descriptors / Indicators: Discomfort Pain Intervention(s): Limited activity within patient's tolerance;Monitored during session;Repositioned    Home Living  Family/patient expects to be discharged to:: Skilled nursing facility Living Arrangements: Spouse/significant other;Children Available Help at Discharge: Family;Available 24 hours/day Type of Home: Mobile home Home Access: Stairs to enter Entrance Stairs-Rails: Left Entrance Stairs-Number of Steps: 3 Home Layout: One level Home Equipment: Walker - 2 wheels Additional Comments: pt extremely hard of hearing.    Prior Function Level of Independence: Needs assistance   Gait / Transfers Assistance Needed: transfers with assist from family to RW. Ambulates with assistance with RW.   ADL's / Homemaking Assistance Needed: patient requires help with dressing, bathing at times. Requires help with IADLs.   Comments: Pt.'s wife, son (home "24/7"), and daughter in law assists with meal preparation, medication management, and transportation. Patient sleeps in recliner. Pt's wife at bedside provided above information.     Hand Dominance   Dominant Hand: Right    Extremity/Trunk Assessment   Upper Extremity Assessment Upper Extremity Assessment: Generalized weakness(at least 3/5 with elbow flexion and extension,3-/5 with BUE shoulder flexion (Right worse than L). Strong grip strength)    Lower Extremity Assessment Lower Extremity Assessment: Generalized weakness(At least 3-/5 with BLE feet dorsiflexion and plantar flexion and hip fleixon)       Communication   Communication: HOH  Cognition Arousal/Alertness: Awake/alert Behavior During Therapy: WFL for tasks assessed/performed Overall Cognitive Status: Within Functional Limits for tasks assessed                                        General Comments General comments (skin integrity, edema, etc.): Bilateral indwelling nephrostomies and ostomy at anterior abdominal are intact from the beginning and end of the session. Nurse tech called and empty ostomy bag at the beginnig of the session.    Exercises Total Joint  Exercises Ankle Circles/Pumps: AROM;Strengthening;Both;10 reps;Supine   Assessment/Plan    PT Assessment Patient needs continued PT services  PT Problem List Decreased strength;Decreased range of motion;Decreased activity tolerance;Decreased balance;Decreased mobility;Decreased coordination;Decreased knowledge of use of DME;Decreased safety awareness;Decreased knowledge of precautions;Pain       PT Treatment Interventions DME instruction;Gait training;Stair training;Functional mobility training;Therapeutic activities;Therapeutic exercise;Balance training;Neuromuscular re-education;Patient/family education    PT Goals (Current goals can be found in the Care Plan section)  Acute Rehab PT Goals Patient Stated Goal: to go home PT Goal Formulation: With patient Time For Goal Achievement: 06/25/19 Potential to Achieve Goals: Fair    Frequency Min 2X/week   Barriers to discharge        Co-evaluation               AM-PAC PT "6 Clicks" Mobility  Outcome Measure Help needed turning from your back to your side while in a flat bed without using bedrails?: A Lot Help needed moving from lying on your back to sitting on the side of a flat bed without using bedrails?: A Lot Help needed moving to and from a bed to a chair (including a wheelchair)?: A Lot Help needed standing up from a chair using your arms (e.g., wheelchair or bedside chair)?: A Lot Help needed  to walk in hospital room?: Total Help needed climbing 3-5 steps with a railing? : Total 6 Click Score: 10    End of Session Equipment Utilized During Treatment: Gait belt Activity Tolerance: Patient limited by fatigue;Patient limited by pain Patient left: with bed alarm set;with call bell/phone within reach;in bed;with family/visitor present Nurse Communication: Mobility status;Weight bearing status;Precautions PT Visit Diagnosis: Unsteadiness on feet (R26.81);Muscle weakness (generalized) (M62.81);Difficulty in walking, not  elsewhere classified (R26.2)    Time: 9558-3167 PT Time Calculation (min) (ACUTE ONLY): 38 min   Charges:                Sherrilyn Rist, SPT 06/11/2019, 3:40 PM

## 2019-06-11 NOTE — Progress Notes (Signed)
RN called angela from Lab RN unable to print label for covid 19 test. sunquest pops a message saying it's lock and it doesn't allow RN to print label. Per angela okay for RN to place chart label and bring test to the lab.

## 2019-06-11 NOTE — NC FL2 (Signed)
[place] LEVEL OF CARE SCREENING TOOL     IDENTIFICATION  Patient Name: Vincent Poole Birthdate: [birth year] Sex: male Admission Date (Current Location): 06/06/2019  [place] and [place] Number:  Engineering geologist and Address:  [facility], [address], [place], [place] [address]      Provider Number: [id number]  Attending Physician Name and Address:  Bettey Costa, MD  Relative Name and Phone Number:       Current Level of Care: Hospital Recommended Level of Care: [place] Prior Approval Number:    Date Approved/Denied:   PASRR Number: [id number]  Discharge Plan: SNF    Current Diagnoses: Patient Active Problem List   Diagnosis Date Noted  . Protein-calorie malnutrition, severe 06/11/2019  . Abdominal pain, generalized   . Palliative care by specialist   . Urothelial cancer ([place])   . Weakness   . Large bowel obstruction ([place]) 06/06/2019  . Hydronephrosis, right 04/08/2019  . Pressure injury of skin 03/23/2019  . SIRS (systemic inflammatory response syndrome) ([place]) 03/22/2019  . Leukocytosis 02/21/2019  . Complicated UTI (urinary tract infection) 11/14/2018  . Palliative care encounter   . Iron deficiency anemia 09/14/2018  . Hip fracture ([place]) 05/01/2018  . Malignant neoplasm of urinary bladder ([place]) 01/12/2018  . Goals of care, counseling/discussion 01/12/2018  . Pyelonephritis 05/31/2017  . UTI (urinary tract infection) 03/04/2017  . Urinary obstruction   . Essential hypertension 08/30/2016  . History of shingles 08/30/2016  . Prostate cancer ([place]) 08/30/2016  . Urinary retention 08/30/2016  . Medicare annual wellness visit, initial 08/01/2016  . Medicare annual wellness visit, subsequent 08/01/2016  . Sepsis ([place]) 07/11/2016  . Borderline diabetes mellitus 01/26/2016  . Vaccine counseling 01/26/2015  . Lumbar radiculitis 06/24/2014  . OA (osteoarthritis) of hip 06/24/2014  . Malignant  neoplasm of prostate ([place]) 01/27/2011    Orientation RESPIRATION BLADDER Height & Weight     Self, Time, Situation, Place  Normal Incontinent Weight: 170 lb 13.7 oz (77.5 kg) Height:  5\' 11"  (180.3 cm)  BEHAVIORAL SYMPTOMS/MOOD NEUROLOGICAL BOWEL NUTRITION STATUS  (None) (None) Continent, Colostomy Diet(Soft diet)  AMBULATORY STATUS COMMUNICATION OF NEEDS Skin   Extensive Assist Verbally Other (Comment), Bruising(Excoriated.)                       Personal Care Assistance Level of Assistance  Bathing, Feeding, Dressing Bathing Assistance: Maximum assistance Feeding assistance: Limited assistance Dressing Assistance: Maximum assistance     Functional Limitations Info  Sight, Hearing, Speech Sight Info: Adequate Hearing Info: Impaired(Have to use a whiteboard) Speech Info: Adequate    SPECIAL CARE FACTORS FREQUENCY  PT (By licensed PT)     PT Frequency: 5 x week              Contractures Contractures Info: Not present    Additional Factors Info  Code Status, Allergies Code Status Info: DNR Allergies Info: NKDA           Current Medications (06/11/2019):  This is the current hospital active medication list Current Facility-Administered Medications  Medication Dose Route Frequency Provider Last Rate Last Dose  . acetaminophen (TYLENOL) tablet 650 mg  650 mg Oral Q6H PRN Mayer Camel, NP   650 mg at 06/09/19 2119  . Chlorhexidine Gluconate Cloth 2 % PADS 6 each  6 each Topical Daily Tukov-Yual, Magdalene S, NP   6 each at 06/11/19 0809  . ciprofloxacin (CIPRO) tablet 500 mg  500  mg Oral BID Bettey Costa, MD   500 mg at 06/11/19 1054  . enoxaparin (LOVENOX) injection 40 mg  40 mg Subcutaneous Q24H Herbert Pun, MD   40 mg at 06/10/19 2043  . feeding supplement (ENSURE ENLIVE) (ENSURE ENLIVE) liquid 237 mL  237 mL Oral BID BM Sudini, Srikar, MD   237 mL at 06/11/19 1253  . HYDROmorphone (DILAUDID) injection 0.5 mg  0.5 mg Intravenous Q4H PRN  Herbert Pun, MD   0.5 mg at 06/10/19 1607  . ondansetron (ZOFRAN) tablet 4 mg  4 mg Oral Q6H PRN Herbert Pun, MD       Or  . ondansetron (ZOFRAN) injection 4 mg  4 mg Intravenous Q6H PRN Herbert Pun, MD      . pantoprazole (PROTONIX) injection 40 mg  40 mg Intravenous Q24H Tukov-Yual, Magdalene S, NP   40 mg at 06/11/19 0556  . sodium chloride flush (NS) 0.9 % injection 3 mL  3 mL Intravenous Q12H Herbert Pun, MD   3 mL at 06/11/19 0809     Discharge Medications: Please see discharge summary for a list of discharge medications.  Relevant Imaging Results:  Relevant Lab Results:   Additional Information SS#: [id number]. Has two nephrostomy tubes.  Candie Chroman, LCSW

## 2019-06-11 NOTE — Progress Notes (Signed)
Pharmacy Antibiotic Note  Vincent Poole is a 84 y.o. male admitted on 06/06/2019 with UTI.  Pharmacy has been consulted for cipro dosing. Pt has large bowel obstruction status post transverse loop colostomy POD 1, complicated by pertinent comorbidities includingbladder cancer with metastases that has not responded to immunotherapy, hypertension, hydronephrosis, severe weakness. Zosyn 7/31>>8/2  Plan: -Will order Cipro 500mg  po bid given pt hx of UTi with pseudomonas  Patient grew Pseudomonas >100,000 colonies and citrobacter 80,000 colonies in urine cx on 04/26/19 w/ the citrobacter only resistant to cefazolin.  7/30 Ucx + multiple species present - suggest recollection.  7/30 Bcx remain negative.   Height: 5\' 11"  (180.3 cm) Weight: 170 lb 13.7 oz (77.5 kg) IBW/kg (Calculated) : 75.3  Temp (24hrs), Avg:98.4 F (36.9 C), Min:98.1 F (36.7 C), Max:98.8 F (37.1 C)  Recent Labs  Lab 06/06/19 2017 06/07/19 0450 06/07/19 2334 06/08/19 0513 06/08/19 1256 06/09/19 0455  WBC 23.5* 21.4*  --  22.9* 23.5* 27.6*  CREATININE 1.38* 1.24 1.16 1.15  --  1.24  LATICACIDVEN 1.6  --   --   --   --   --     Estimated Creatinine Clearance: 48.9 mL/min (by C-G formula based on SCr of 1.24 mg/dL).    No Known Allergies   Zosyn 7/31- 8/2  Thank you for allowing pharmacy to be a part of this patient's care.  Chinita Greenland PharmD Clinical Pharmacist 06/11/2019

## 2019-06-12 ENCOUNTER — Encounter: Payer: Self-pay | Admitting: *Deleted

## 2019-06-12 LAB — CBC
HCT: 23.4 % — ABNORMAL LOW (ref 39.0–52.0)
Hemoglobin: 7.5 g/dL — ABNORMAL LOW (ref 13.0–17.0)
MCH: 28.5 pg (ref 26.0–34.0)
MCHC: 32.1 g/dL (ref 30.0–36.0)
MCV: 89 fL (ref 80.0–100.0)
Platelets: 331 10*3/uL (ref 150–400)
RBC: 2.63 MIL/uL — ABNORMAL LOW (ref 4.22–5.81)
RDW: 15.3 % (ref 11.5–15.5)
WBC: 17.7 10*3/uL — ABNORMAL HIGH (ref 4.0–10.5)
nRBC: 0 % (ref 0.0–0.2)

## 2019-06-12 MED ORDER — HEPARIN SOD (PORK) LOCK FLUSH 10 UNIT/ML IV SOLN
10.0000 [IU] | Freq: Once | INTRAVENOUS | Status: AC
  Administered 2019-06-12: 15:00:00 10 [IU] via INTRAVENOUS
  Filled 2019-06-12: qty 1

## 2019-06-12 NOTE — Progress Notes (Signed)
Vincent Poole  A and O x 4. VSS. Pt tolerating diet well. No complaints of pain or nausea. IV removed intact, prescriptions given. Pt voiced understanding of discharge instructions with no further questions. Pt discharged via EMS to [facility].    Francesco Sor

## 2019-06-12 NOTE — Progress Notes (Signed)
Patient ID: SHADE KALEY, male   DOB: Mar 15, 1935, 84 y.o.   MRN: [id number]  This NP spoke to patient's wife as follow-up to yesterday's palliative conversation, to answer questions and address concerns and to offer emotional support  Continued conversation regarding current medical situation; diagnosis, prognosis, goals of care, treatment options and anticipatory care needs.  Completely opposite from yesterday's conversation today family is looking to transition patient to skilled nursing facility for rehab.  She tells me that "I need him to get a little stronger before he comes home."  Although she verbalizes an understanding of the seriousness of her husband's current medical situation, I worry that she does not understand the limited prognosis.  We discussed the concept of mortality and the limitations of medical interventions when the body begins to fail.  She understands that he is not a candidate for further oncology treatments at this time.  She verbalizes a strong base in faith, "a religious faith is deep".  "God can heal "  Discussed with wife the importance of continued conversation with her husband/family and the medical providers regarding overall plan of care and treatment options,  ensuring decisions are within the context of the patients values and GOCs.  Questions and concerns addressed   I left my contact information and encouraged her to call with questions and concerns.  Again encouraged family to accept outpatient community-based palliative services however they have declined  Total time spent on the unit was 20 minutes     Greater than 50% of the time was spent in counseling and coordination of care  Wadie Lessen NP  Palliative Medicine Team Team Phone # [phone] Pager [phone]

## 2019-06-12 NOTE — Progress Notes (Signed)
[facility] [place] NAME: Vincent Poole    MR#:  [id number]  DATE OF BIRTH:  1935-09-26  SUBJECTIVE:  Tearful this am  REVIEW OF SYSTEMS:    Review of Systems  Constitutional: Negative for fever, chills weight loss HENT: Negative for ear pain, nosebleeds, congestion, facial swelling, rhinorrhea, neck pain, neck stiffness and ear discharge.   Respiratory: Negative for cough, shortness of breath, wheezing  Cardiovascular: Negative for chest pain, palpitations and leg swelling.  Gastrointestinal: Negative for heartburn, abdominal pain, vomiting, diarrhea or consitpation Genitourinary: Negative for dysuria, urgency, frequency, hematuria Musculoskeletal: Negative for back pain or joint pain Neurological: Negative for dizziness, seizures, syncope, focal weakness,  numbness and headaches.  Hematological: Does not bruise/bleed easily.  Psychiatric/Behavioral: Negative for hallucinations, confusion, dysphoric mood    Tolerating Diet: yes      DRUG ALLERGIES:  No Known Allergies  VITALS:  Blood pressure (!) 103/51, pulse 85, temperature 99.1 F (37.3 C), temperature source Oral, resp. rate 17, height 5\' 11"  (1.803 m), weight 77.5 kg, SpO2 98 %.  PHYSICAL EXAMINATION:  Constitutional: Appears well-developed and well-nourished. No distress. HENT: Normocephalic. Marland Kitchen Oropharynx is clear and moist.  Eyes: Conjunctivae and EOM are normal. PERRLA, no scleral icterus.  Neck: Normal ROM. Neck supple. No JVD. No tracheal deviation. CVS: RRR, S1/S2 +, no murmurs, no gallops, no carotid bruit.  Pulmonary: Effort and breath sounds normal, no stridor, rhonchi, wheezes, rales.  Abdominal: Soft. BS +,  no distension, tenderness, rebound or guarding.  Musculoskeletal: Normal range of motion. No edema and no tenderness.  Neuro: Alert. CN 2-12 grossly intact. No focal deficits. Skin: Skin is warm and dry. No rash noted. Psychiatric: Normal mood and  affect.      LABORATORY PANEL:   CBC Recent Labs  Lab 06/12/19 0543  WBC 17.7*  HGB 7.5*  HCT 23.4*  PLT 331   ------------------------------------------------------------------------------------------------------------------  Chemistries  Recent Labs  Lab 06/09/19 0455  NA 137  K 4.1  CL 105  CO2 25  GLUCOSE 120*  BUN 23  CREATININE 1.24  CALCIUM 8.1*  MG 2.1  AST 9*  ALT <5  ALKPHOS 60  BILITOT 0.3   ------------------------------------------------------------------------------------------------------------------  Cardiac Enzymes No results for input(s): TROPONINI in the last 168 hours. ------------------------------------------------------------------------------------------------------------------  RADIOLOGY:  No results found.   ASSESSMENT AND PLAN:   84 year old male with stage IV bladder cancer and bilateral PCNs who presented to the emergency room with bowel obstruction secondary to local effects of bladder tumor.   1. Bowel obstruction: Patient is postoperative day #3. 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 Overall patient's performance status is very poor and not a candidate for chemotherapy at this time. Family is aware of this as it is patient.    3. Bilateral PCN: Urine culture shows multiple species did recommend lab collection however patient has been ordered broad-spectrum antibiotics. UA this am with persistent UTI. Citrobacter and Pseudomonas in the past sensitive to ciprofloxacin which has been started.   4. Hypokalemia: This is resolved      Management plans discussed with the patient and wife and they are is in agreement.  CODE STATUS: dnr  TOTAL TIME TAKING CARE OF THIS PATIENT: 22 minutes.     POSSIBLE D/C today, DEPENDING ON CLINICAL CONDITION.   Bettey Costa  M.D on 06/12/2019 at 9:24 AM  Between 7am to 6pm - Pager - [phone] After 6pm go to [email or web address] - password [email or web address] [facility]  Office  [phone]  CC: Primary care physician; [name], MD  Note: This dictation was prepared with Dragon dictation along with smaller phrase technology. Any transcriptional errors that result from this process are unintentional.

## 2019-06-12 NOTE — Progress Notes (Signed)
RN call report to [facility]. Report given to [facility] RN.

## 2019-06-12 NOTE — TOC Transition Note (Signed)
Transition of Care [facility]) - CM/SW Discharge Note   Patient Details  Name: Vincent Poole MRN: [id number] Date of Birth: [birth year]  [facility] CM/SW Contact:  Candie Chroman, LCSW Phone Number: 06/12/2019, 11:57 AM   Clinical Narrative:  Patient has insurance approval to discharge to [facility] today. COVID negative. RN will arrange transport after lunch. RN to call report to facility at [phone], room 808. CSW signing off.  Final next level of care: Skilled Nursing Facility Barriers to Discharge: Barriers Resolved   Patient Goals and CMS Choice        Discharge Placement   Existing PASRR number confirmed : 06/11/19          Patient chooses bed at: [facility] [place] Patient to be transferred to facility by: EMS Name of family member notified: Bharath Bernstein Patient and family notified of of transfer: 06/12/19  Discharge Plan and Services     Post Acute Care Choice: [place]                               Social Determinants of Health (SDOH) Interventions     Readmission Risk Interventions Readmission Risk Prevention Plan 04/28/2019 03/26/2019 03/24/2019  Transportation Screening Complete - Complete  PCP or Specialist Appt within 3-5 Days Not Complete - -  HRI or Home Care Consult Complete - Complete  Social Work Consult for [place] Planning/Counseling Patient refused - -  Palliative Care Screening - Complete -  Medication Review Press photographer) Complete - -  Some recent data might be hidden

## 2019-06-12 NOTE — Consult Note (Signed)
[place] Nurse ostomy follow up Stoma type/location:  Left mid abdomen/transverse colon Stomal assessment/size: 1 3/4" slightly oval shaped, pink, flush Peristomal assessment: NA Treatment options for stomal/peristomal skin: using 2" barrier ring for flush stoma Output soft brown/gelitinous  Ostomy pouching: 2pc. 2 3/4' in place  Education provided:  Demonstrated cutting new skin barrier and allowed wife to cut skin barrier, use of barrier ring) Wife talked with [place] nurse through steps of placement  Attached pouch to skin barrier several times Practiced lock and roll closure  Education on emptying when 1/3 to 1/2 full and how to empty Demonstrated "burping" flatus from pouch Demonstrated use of wick to clean spout  Discussed pouch change frequency, supplies with wife. Patient is extremely HOH, does not participate in conversation. 3 pouches/3 skin barriers/4 barrier rings in the patient's room for DC to [facility] today Enrolled patient in [facility] Start Discharge program: Yes  [place] Nurse will follow along with you for continued support with ostomy teaching and care [place] MSN, [place], [place], [place], [place]

## 2019-06-18 ENCOUNTER — Ambulatory Visit: Payer: Medicare Other

## 2019-06-18 ENCOUNTER — Ambulatory Visit: Payer: Medicare Other | Admitting: Oncology

## 2019-06-18 ENCOUNTER — Other Ambulatory Visit: Payer: Medicare Other

## 2019-06-20 ENCOUNTER — Telehealth: Payer: Self-pay | Admitting: Urology

## 2019-06-20 NOTE — Telephone Encounter (Signed)
Mr. Powell had bilateral nephrostomy tubes that need to be exchanged at some point.  His last exchange was on 05/31/2019.

## 2019-06-25 ENCOUNTER — Other Ambulatory Visit: Payer: Medicare Other

## 2019-06-25 ENCOUNTER — Ambulatory Visit: Payer: Medicare Other | Admitting: Oncology

## 2019-06-25 ENCOUNTER — Telehealth: Payer: Self-pay | Admitting: *Deleted

## 2019-06-25 NOTE — Telephone Encounter (Signed)
Called wife to get update on pt. Wife states that he went to see Dr. Peyton Najjar for piece of the ostomy to be cut off. He got amplifiers that was ordered off [facility] and he can hear good with them. She did get a call from ENT for hearing and was told they will keep it on hold and when he gets out of rehab they can make an appt if the pt. Wants it. Yest. He got up and walked with PT. She has a walker, hosp. Bed, and hoyer lift at home. They said at rehab that they eval him once a week and want to get him the best he can be before he is d/c and has no date in mind at this time He is eating and drinking good over there. She got to be with him for 2 hours to go to surgery office today. He came over in a [place] rather than EMS he was feeling that much better

## 2019-06-28 ENCOUNTER — Inpatient Hospital Stay (HOSPITAL_BASED_OUTPATIENT_CLINIC_OR_DEPARTMENT_OTHER): Payer: Medicare Other | Admitting: Oncology

## 2019-06-28 ENCOUNTER — Telehealth: Payer: Self-pay | Admitting: *Deleted

## 2019-06-28 ENCOUNTER — Other Ambulatory Visit: Payer: Self-pay

## 2019-06-28 ENCOUNTER — Encounter: Payer: Self-pay | Admitting: Oncology

## 2019-06-28 ENCOUNTER — Inpatient Hospital Stay: Payer: Medicare Other | Attending: Oncology

## 2019-06-28 VITALS — BP 97/62 | HR 87 | Temp 96.7°F | Resp 18

## 2019-06-28 DIAGNOSIS — D638 Anemia in other chronic diseases classified elsewhere: Secondary | ICD-10-CM | POA: Diagnosis not present

## 2019-06-28 DIAGNOSIS — K219 Gastro-esophageal reflux disease without esophagitis: Secondary | ICD-10-CM | POA: Diagnosis not present

## 2019-06-28 DIAGNOSIS — E876 Hypokalemia: Secondary | ICD-10-CM | POA: Diagnosis not present

## 2019-06-28 DIAGNOSIS — R5383 Other fatigue: Secondary | ICD-10-CM | POA: Insufficient documentation

## 2019-06-28 DIAGNOSIS — R5381 Other malaise: Secondary | ICD-10-CM | POA: Diagnosis not present

## 2019-06-28 DIAGNOSIS — C679 Malignant neoplasm of bladder, unspecified: Secondary | ICD-10-CM | POA: Insufficient documentation

## 2019-06-28 DIAGNOSIS — C61 Malignant neoplasm of prostate: Secondary | ICD-10-CM

## 2019-06-28 DIAGNOSIS — Z933 Colostomy status: Secondary | ICD-10-CM | POA: Insufficient documentation

## 2019-06-28 DIAGNOSIS — C7982 Secondary malignant neoplasm of genital organs: Secondary | ICD-10-CM | POA: Insufficient documentation

## 2019-06-28 DIAGNOSIS — K5900 Constipation, unspecified: Secondary | ICD-10-CM | POA: Insufficient documentation

## 2019-06-28 DIAGNOSIS — Z79899 Other long term (current) drug therapy: Secondary | ICD-10-CM | POA: Insufficient documentation

## 2019-06-28 DIAGNOSIS — Z87440 Personal history of urinary (tract) infections: Secondary | ICD-10-CM | POA: Diagnosis not present

## 2019-06-28 DIAGNOSIS — I10 Essential (primary) hypertension: Secondary | ICD-10-CM | POA: Insufficient documentation

## 2019-06-28 DIAGNOSIS — C689 Malignant neoplasm of urinary organ, unspecified: Secondary | ICD-10-CM

## 2019-06-28 DIAGNOSIS — Z95828 Presence of other vascular implants and grafts: Secondary | ICD-10-CM

## 2019-06-28 LAB — CBC WITH DIFFERENTIAL/PLATELET
Abs Immature Granulocytes: 0.09 10*3/uL — ABNORMAL HIGH (ref 0.00–0.07)
Basophils Absolute: 0.1 10*3/uL (ref 0.0–0.1)
Basophils Relative: 0 %
Eosinophils Absolute: 0.2 10*3/uL (ref 0.0–0.5)
Eosinophils Relative: 1 %
HCT: 23.9 % — ABNORMAL LOW (ref 39.0–52.0)
Hemoglobin: 7.4 g/dL — ABNORMAL LOW (ref 13.0–17.0)
Immature Granulocytes: 1 %
Lymphocytes Relative: 12 %
Lymphs Abs: 1.9 10*3/uL (ref 0.7–4.0)
MCH: 27.1 pg (ref 26.0–34.0)
MCHC: 31 g/dL (ref 30.0–36.0)
MCV: 87.5 fL (ref 80.0–100.0)
Monocytes Absolute: 1 10*3/uL (ref 0.1–1.0)
Monocytes Relative: 6 %
Neutro Abs: 13 10*3/uL — ABNORMAL HIGH (ref 1.7–7.7)
Neutrophils Relative %: 80 %
Platelets: 411 10*3/uL — ABNORMAL HIGH (ref 150–400)
RBC: 2.73 MIL/uL — ABNORMAL LOW (ref 4.22–5.81)
RDW: 14.6 % (ref 11.5–15.5)
WBC: 16.1 10*3/uL — ABNORMAL HIGH (ref 4.0–10.5)
nRBC: 0 % (ref 0.0–0.2)

## 2019-06-28 LAB — COMPREHENSIVE METABOLIC PANEL
ALT: 17 U/L (ref 0–44)
AST: 17 U/L (ref 15–41)
Albumin: 2.2 g/dL — ABNORMAL LOW (ref 3.5–5.0)
Alkaline Phosphatase: 61 U/L (ref 38–126)
Anion gap: 8 (ref 5–15)
BUN: 22 mg/dL (ref 8–23)
CO2: 27 mmol/L (ref 22–32)
Calcium: 8.6 mg/dL — ABNORMAL LOW (ref 8.9–10.3)
Chloride: 100 mmol/L (ref 98–111)
Creatinine, Ser: 1.13 mg/dL (ref 0.61–1.24)
GFR calc Af Amer: >60 mL/min (ref >60)
GFR calc non Af Amer: >60 mL/min (ref >60)
Glucose, Bld: 121 mg/dL — ABNORMAL HIGH (ref 70–99)
Potassium: 2.9 mmol/L — ABNORMAL LOW (ref 3.5–5.1)
Sodium: 135 mmol/L (ref 135–145)
Total Bilirubin: 0.4 mg/dL (ref 0.3–1.2)
Total Protein: 6.3 g/dL — ABNORMAL LOW (ref 6.5–8.1)

## 2019-06-28 LAB — TSH: TSH: 3.292 u[IU]/mL (ref 0.350–4.500)

## 2019-06-28 MED ORDER — SODIUM CHLORIDE 0.9% FLUSH
10.0000 mL | Freq: Once | INTRAVENOUS | Status: AC
  Administered 2019-06-28: 11:00:00 10 mL via INTRAVENOUS
  Filled 2019-06-28: qty 10

## 2019-06-28 MED ORDER — HEPARIN SOD (PORK) LOCK FLUSH 100 UNIT/ML IV SOLN
500.0000 [IU] | Freq: Once | INTRAVENOUS | Status: AC
  Administered 2019-06-28: 11:00:00 500 [IU] via INTRAVENOUS
  Filled 2019-06-28: qty 5

## 2019-06-28 NOTE — Progress Notes (Signed)
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 For his prostate cancer he has received IM RT in the past. He has been seeing Dr. Erlene Quan in the past for his recurrent UTIs as well as hematuria. CT scan in July 2018 showed bladder wall thickening with perivascular edema and inflammation in the right ureter greater than the left. Findings were thought to be due to pyelonephritis at that time. He underwent cystoscopy on 12/14/2017 which showed abnormal looking prostate with necrotic material lining the entire surface area. Diffuse copious debris within the bladder appearing to be erythematous without discrete bladder tumor but visualization was poor. He was then admitted to the hospital on 12/26/2017 with symptoms of UTI and sepsis as well as new moderate bilateral hydronephrosis.  CT abdomen and pelvis with contrast on 12/20/2017 again showed market bile bladder wall thickening with perivesicular edema. Within the lumen of the bladder there is a 93.9 cm soft tissue attenuating filling defect. This is indeterminate and could represent an area of blood clot versus urothelial lesion.  He underwent repeat cystoscopy with bilateral pyelogram and ureteral stent placement as well as TURBT and TURP. He was found to have a massive tumor involving the majority of the bladder with grossly necrotic and calcified material. There appeared to be multifocal disease  with large burden of the left anterior bladder wall extending posteriorly as well as adjacent to the bladder neck and beyond the right hemitrigone. Very little normal recognizable bladder mucosa remaining.   Biopsy from TURBT and TURP showed: High-grade urothelial carcinoma with extensive necrosisinvolving both the bladder and the prostate.CT chest did not reveal any evidence of metastatic disease. He was also not found to have any regional adenopathy on CT abdomen  Patient was seen by Dr. Erlene Quan and was not deemed to be a surgical candidate. He has been referred to [place] for definitive treatment options.  Patient lives with his wife at home and ambulates with a cane. He does need assistance with his ADLs to some extent. He has not had any falls and he denies any changes in his appetite or unintentional weight loss. Denies any pain. Reports some fatigue and occasional problems with constipation. He has had 3 hospitalizations last year for urinary tract infections and currently has a chronic Foley for the last 1 month  Dr. Erlene Quan performed interval TURBT on 01/15/18 and was able to debulk tumor as much as possible to reduce tumor burden  Patient received 5 cycles ofcarboplatin/ gemcitabine 2 weeks on and 1 week off ending 04/26/18.Patient did receive radiation for 10 days during cycle 2 of treatment. Given patients age, co-morbidities and frailty- he was not a cisplatin candidate.6th cycle not given due to fall and hip fracture  Patient had a repeat cystoscopy in September 2019 which showed no obvious tumor bladder but it did have a shaggy necrotic appearance at the bladder neck. Prostatic fossa was grossly abnormal necrotic and irregular without papillary change. Bladder neck was biopsied and showed residual high-grade urothelial carcinoma. Muscularis propria was not seen in  that specimen.Due to evidence of recurrent/residual disease patient was started on Tecentriq on  07/26/2018   Interval history-he is still at [facility] and trying to improve his strength and ambulate better.  He has been having regular bowel movements and has a good appetite.  He is extremely hard of hearing and communication is only troponin taper.  I also spoke to his wife [name] over the phone during my visit today  ECOG PS- 3 Pain scale- 0 Opioid associated constipation- no  Review of systems- Review of Systems  Constitutional: Positive for malaise/fatigue and weight loss. Negative for chills and fever.  HENT: Positive for hearing loss. Negative for congestion, ear discharge and nosebleeds.   Eyes: Negative for blurred vision.  Respiratory: Negative for cough, hemoptysis, sputum production, shortness of breath and wheezing.   Cardiovascular: Negative for chest pain, palpitations, orthopnea and claudication.  Gastrointestinal: Negative for abdominal pain, blood in stool, constipation, diarrhea, heartburn, melena, nausea and vomiting.  Genitourinary: Negative for dysuria, flank pain, frequency, hematuria and urgency.  Musculoskeletal: Negative for back pain, joint pain and myalgias.  Skin: Negative for rash.  Neurological: Negative for dizziness, tingling, focal weakness, seizures, weakness and headaches.  Endo/Heme/Allergies: Does not bruise/bleed easily.  Psychiatric/Behavioral: Negative for depression and suicidal ideas. The patient does not have insomnia.       No Known Allergies   Past Medical History:  Diagnosis Date   Anxiety    Arthritis    Bladder cancer ([place])    Dysrhythmia 07/2018   history of atrial flutter that worsens with anxiety   Femur fracture, right ([place]) 05/01/2018   GERD (gastroesophageal reflux disease)    History of recent blood transfusion 05/2018   Hypertension    Iron deficiency anemia 09/14/2018   Prostate cancer ([place]) 07/2018   cancer growing in prostate but not prostate cancer, it is from the bladder   Umbilical hernia  65/4650   Urinary retention 2019   foley catheter place 11/2017   UTI (urinary tract infection) 2019   frequent UTI's over last year   Wound eschar of foot 07/2018   left heal getting wrapped and requiring antibiotic cream. cracks open with weight bearing.     Past Surgical History:  Procedure Laterality Date   CARPAL TUNNEL RELEASE Right    CHOLECYSTECTOMY  2004   COLOSTOMY N/A 06/08/2019   Procedure: COLOSTOMY;  Surgeon: Herbert Pun, MD;  Location: [facility];  Service: General;  Laterality: N/A;   CYSTOGRAM  07/18/2018   Procedure: CYSTOGRAM;  Surgeon: [name], MD;  Location: [facility];  Service: Urology;;   Consuela Mimes W/ RETROGRADES Bilateral 07/18/2018   Procedure: CYSTOSCOPY WITH RETROGRADE PYELOGRAM;  Surgeon: [name], MD;  Location: [facility];  Service: Urology;  Laterality: Bilateral;   CYSTOSCOPY W/ URETERAL STENT PLACEMENT Bilateral 12/27/2017   Procedure: CYSTOSCOPY WITH RETROGRADE PYELOGRAM/URETERAL STENT PLACEMENT;  Surgeon: [name], MD;  Location: [facility];  Service: Urology;  Laterality: Bilateral;   CYSTOSCOPY W/ URETERAL STENT PLACEMENT Bilateral 07/18/2018   Procedure: CYSTOSCOPY WITH STENT REPLACEMENT (exchange);  Surgeon: [name], MD;  Location: [facility];  Service: Urology;  Laterality: Bilateral;   CYSTOSCOPY WITH STENT PLACEMENT Bilateral 11/12/2018   Procedure: [place] WITH STENT Exchange;  Surgeon: [name], MD;  Location: [facility];  Service: Urology;  Laterality: Bilateral;   INTRAMEDULLARY (IM) NAIL INTERTROCHANTERIC Right 05/02/2018   Procedure: INTRAMEDULLARY (IM) NAIL INTERTROCHANTRIC;  Surgeon: [name], MD;  Location: [facility];  Service: Orthopedics;  Laterality: Right;  IR NEPHROSTOMY EXCHANGE LEFT  03/25/2019   IR NEPHROSTOMY EXCHANGE LEFT  04/19/2019   IR NEPHROSTOMY EXCHANGE LEFT  05/31/2019   IR NEPHROSTOMY EXCHANGE RIGHT  03/25/2019   IR NEPHROSTOMY EXCHANGE RIGHT  04/19/2019   IR  NEPHROSTOMY EXCHANGE RIGHT  05/31/2019   IR NEPHROSTOMY PLACEMENT LEFT  02/23/2019   IR NEPHROSTOMY PLACEMENT RIGHT  02/23/2019   LAPAROTOMY N/A 06/08/2019   Procedure: EXPLORATORY LAPAROTOMY;  Surgeon: Herbert Pun, MD;  Location: [facility];  Service: General;  Laterality: N/A;   LEG TENDON SURGERY Right 1958   PORTA CATH INSERTION N/A 01/22/2018   Procedure: PORTA CATH INSERTION;  Surgeon: [name], MD;  Location: [facility];  Service: Cardiovascular;  Laterality: N/A;   TRANSURETHRAL RESECTION OF BLADDER TUMOR N/A 12/27/2017   Procedure: TRANSURETHRAL RESECTION OF BLADDER TUMOR (TURBT);  Surgeon: [name], MD;  Location: [facility];  Service: Urology;  Laterality: N/A;   TRANSURETHRAL RESECTION OF BLADDER TUMOR N/A 01/15/2018   Procedure: TRANSURETHRAL RESECTION OF BLADDER TUMOR (TURBT);  Surgeon: [name], MD;  Location: [facility];  Service: Urology;  Laterality: N/A;  Need 2 hrs for this case please   TRANSURETHRAL RESECTION OF BLADDER TUMOR N/A 07/18/2018   Procedure: TRANSURETHRAL RESECTION OF BLADDER TUMOR (TURBT);  Surgeon: [name], MD;  Location: [facility];  Service: Urology;  Laterality: N/A;    Social History   Socioeconomic History   Marital status: Married    Spouse name: ruth   Number of children: Not on file   Years of education: Not on file   Highest education level: Not on file  Occupational History   Occupation: retired    Comment: Barista strain: Patient refused   Food insecurity    Worry: Patient refused    Inability: Patient refused   Transportation needs    Medical: Patient refused    Non-medical: Patient refused  Tobacco Use   Smoking status: Never Smoker   Smokeless tobacco: Never Used  Substance and Sexual Activity   Alcohol use: No    Alcohol/week: 0.0 standard drinks   Drug use: No   Sexual activity: Not Currently  Lifestyle   Physical activity     Days per week: Patient refused    Minutes per session: Patient refused   Stress: Patient refused  Relationships   Social connections    Talks on phone: Patient refused    Gets together: Patient refused    Attends religious service: Patient refused    Active member of club or organization: Patient refused    Attends meetings of clubs or organizations: Patient refused    Relationship status: Patient refused   Intimate partner violence    Fear of current or ex partner: Patient refused    Emotionally abused: Patient refused    Physically abused: Patient refused    Forced sexual activity: Patient refused  Other Topics Concern   Not on file  Social History Narrative   Not on file    Family History  Problem Relation Age of Onset   Cancer Mother    Chronic Renal Failure Mother    Heart disease Father      Current Outpatient Medications:    ciprofloxacin (CIPRO) 500 MG tablet, Take 500 mg by mouth 2 (two) times daily., Disp: , Rfl:    Cranberry-Cholecalciferol (SUPER CRANBERRY/VITAMIN D3) 4200-500 MG-UNIT CAPS, Take 1 capsule by mouth 2 (two) times daily., Disp: , Rfl:    vitamin  B-12 (CYANOCOBALAMIN) 500 MCG tablet, Take 500 mcg by mouth daily., Disp: , Rfl:    acetaminophen (TYLENOL) 500 MG tablet, Take 1,000 mg by mouth every 6 (six) hours as needed for moderate pain or headache. , Disp: , Rfl:    cetirizine (ZYRTEC) 10 MG tablet, Take 10 mg by mouth daily as needed for allergies. , Disp: , Rfl:    feeding supplement, ENSURE ENLIVE, (ENSURE ENLIVE) LIQD, Take 237 mLs by mouth 2 (two) times daily between meals., Disp: 237 mL, Rfl: 12   ferrous sulfate 325 (65 FE) MG tablet, Take 1 tablet (325 mg total) by mouth 2 (two) times daily with a meal., Disp: , Rfl: 3   lidocaine-prilocaine (EMLA) cream, Apply 1 application topically as needed., Disp: 30 g, Rfl: 1   Multiple Vitamin (MULTIVITAMIN WITH MINERALS) TABS tablet, Take 1 tablet by mouth daily., Disp: , Rfl:     MYRBETRIQ 50 MG TB24 tablet, TAKE 1 TABLET BY MOUTH DAILY, Disp: 30 tablet, Rfl: 3   nystatin (NYAMYC) powder, Apply topically 2 (two) times daily., Disp: 60 g, Rfl: 3   oxyCODONE (OXY IR/ROXICODONE) 5 MG immediate release tablet, Take 1 tablet (5 mg total) by mouth every 4 (four) hours as needed for severe pain., Disp: 60 tablet, Rfl: 0   polyethylene glycol powder (MIRALAX) powder, Take 17 g by mouth daily as needed. Can increase to 3 times a day as needed for constipation but hold medication if has diarrhea, Disp: 255 g, Rfl: 0   zinc oxide 20 % ointment, Apply 1 application topically as needed for irritation., Disp: , Rfl:   Physical exam:  Vitals:   06/28/19 1109  BP: 97/62  Pulse: 87  Resp: 18  Temp: (!) 96.7 F (35.9 C)  TempSrc: Tympanic   Physical Exam Constitutional:      Comments: Frail elderly gentleman appears fatigued.  Sitting in a wheelchair.  Cannot hear  HENT:     Head: Normocephalic and atraumatic.  Eyes:     Pupils: Pupils are equal, round, and reactive to light.  Neck:     Musculoskeletal: Normal range of motion.  Cardiovascular:     Rate and Rhythm: Normal rate and regular rhythm.     Heart sounds: Normal heart sounds.  Pulmonary:     Effort: Pulmonary effort is normal.     Breath sounds: Normal breath sounds.  Abdominal:     General: Bowel sounds are normal.     Palpations: Abdomen is soft.     Comments: Bilateral nephrostomy tubes in place.  Diverting colostomy in place  Skin:    General: Skin is warm and dry.  Neurological:     Mental Status: He is alert and oriented to person, place, and time.      CMP Latest Ref Rng & Units 06/28/2019  Glucose 70 - 99 mg/dL 121(H)  BUN 8 - 23 mg/dL 22  Creatinine 0.61 - 1.24 mg/dL 1.13  Sodium 135 - 145 mmol/L 135  Potassium 3.5 - 5.1 mmol/L 2.9(L)  Chloride 98 - 111 mmol/L 100  CO2 22 - 32 mmol/L 27  Calcium 8.9 - 10.3 mg/dL 8.6(L)  Total Protein 6.5 - 8.1 g/dL 6.3(L)  Total Bilirubin 0.3 - 1.2 mg/dL  0.4  Alkaline Phos 38 - 126 U/L 61  AST 15 - 41 U/L 17  ALT 0 - 44 U/L 17   CBC Latest Ref Rng & Units 06/28/2019  WBC 4.0 - 10.5 K/uL 16.1(H)  Hemoglobin 13.0 - 17.0 g/dL 7.4(L)  Hematocrit 39.0 - 52.0 % 23.9(L)  Platelets 150 - 400 K/uL 411(H)    No images are attached to the encounter.  Ct Abdomen Pelvis W Contrast  Result Date: 06/06/2019 CLINICAL DATA:  Patient c/o abdominal pain, emesis, constipation, weakness. Patient is a cancer patient with weekly treatments in the cancer center (unsure if chemo). Patient has bilateral nephrostomy tubes. EXAM: CT ABDOMEN AND PELVIS WITH CONTRAST TECHNIQUE: Multidetector CT imaging of the abdomen and pelvis was performed using the standard protocol following bolus administration of intravenous contrast. CONTRAST:  174mL OMNIPAQUE IOHEXOL 300 MG/ML  SOLN COMPARISON:  CT of the abdomen and pelvis on 04/16/2019, 02/21/2019 FINDINGS: Lower chest: Extensive coronary artery calcification. Pericardial thickening or small effusion. Small hiatal hernia. Hepatobiliary: Cholecystectomy. Liver is otherwise unremarkable. Pancreas: Atrophic pancreas normal in appearance. Spleen: Normal in size without focal abnormality. Adrenals/Urinary Tract: Adrenal glands are normal. Bilateral percutaneous nephrostomy catheters. Stable RIGHT renal cyst. Ureters are decompressed. Irregular soft tissue thickening of the urinary bladder wall, associated with surrounding inflammatory changes. There is a broad interface with the anterior abdominal wall, suspicious for involvement of the anterior abdominal wall. Stomach/Bowel: Hiatal hernia. Stomach is unremarkable. Small bowel loops are not dilated. The appendix is distended and contains appendicoliths. Appendix measures 13 millimeters in thickness. There is new, significant dilatation of the colon. Cecum measures 9.0 centimeters. The colon is dilated and stool filled throughout its course to level of the descending colon where there is a  beak adjacent to the bladder mass, best seen on image 32/5. This abrupt transition zone is consistent with malignant adhesion or mass effects and causing significant obstruction. A second possible narrowing is also identified within the sigmoid colon best seen on coronal image 19/5. There is no evidence for free intraperitoneal air. Vascular/Lymphatic: Dense atherosclerotic calcifications of the abdominal aorta. No aneurysm. Reproductive: Prostatic calcifications. Previous TURP. Other: No ascites. Small amount of fluid within the RIGHT inguinal canal. Musculoskeletal: Degenerative changes are seen spine and hips, RIGHT greater than LEFT. Previous ORIF of the RIGHT hip. Significant degenerative changes and postoperative IMPRESSION: 1. Large bowel obstruction likely related to bladder tumor. Sharp transition zone in the LOWER descending colon is consistent with malignant adhesion, direct invasion by the tumor, or mass effect from the tumor. Cecum measures up to 9 centimeters. 2. Suspect involvement of the anterior abdominal wall by bladder tumor. 3. No evidence for free intraperitoneal air. 4. Bilateral percutaneous nephrostomy catheters. 5. Hiatal hernia. 6. Coronary artery disease. 7. Previous TURP. 8. Aortic Atherosclerosis (ICD10-I70.0). These results were called by telephone at the time of interpretation on 06/06/2019 at 8:58 pm to Dr. Marjean Donna , who verbally acknowledged these results. Electronically Signed   By: Nolon Nations M.D.   On: 06/06/2019 21:00   Ir Nephrostomy Exchange Left  Result Date: 05/31/2019 INDICATION: Routine exchange EXAM: FLUOROSCOPIC GUIDED BILATERAL SIDED NEPHROSTOMY CATHETER EXCHANGE COMPARISON:  Multiple previous fluoroscopic guided exchanges most recently on 04/19/2019 and 03/25/2019. CONTRAST:  A total of 20 mL Isovue-300 administered was administered into both collecting systems FLUOROSCOPY TIME:  36 seconds (9.5 mGy) COMPLICATIONS: None immediate. TECHNIQUE: Informed written  consent was obtained from the patient after a discussion of the risks, benefits and alternatives to treatment. Questions regarding the procedure were encouraged and answered. A timeout was performed prior to the initiation of the procedure. The bilateral flanks and external portions of existing nephrostomy catheters were prepped and draped in the usual sterile fashion. A sterile drape was applied covering the operative field. Maximum barrier  sterile technique with sterile gowns and gloves were used for the procedure. A timeout was performed prior to the initiation of the procedure. A pre procedural spot fluoroscopic image was obtained. Beginning with the left-sided nephrostomy, a small amount of contrast was injected via the existing left-sided nephrostomy catheter demonstrating appropriate positioning within the renal pelvis. The existing nephrostomy catheter was cut and cannulated with a Benson wire which was coiled within the renal pelvis. Under intermittent fluoroscopic guidance, the existing nephrostomy catheter was exchanged for a new 12 [place] all-purpose drainage catheter. Limited contrast injection confirmed appropriate positioning within the left renal pelvis and a post exchange fluoroscopic image was obtained. The catheter was locked, secured to the skin with an interrupted suture and reconnected to a gravity bag. The identical repeat procedure was repeated for the contralateral right-sided nephrostomy, ultimately allowing successful exchange of a new 12 [place] all-purpose drainage catheter with end coiled and locked within the right renal pelvis. Dressings were placed. The patient tolerated the above procedures well without immediate postprocedural complication. FINDINGS: The existing nephrostomy catheters are appropriately positioned and functioning. After successful fluoroscopic guided exchange, new bilateral 12 French nephrostomy catheters are coiled and locked within the respective renal pelvises.  IMPRESSION: Successful fluoroscopic guided exchange of bilateral 12 French percutaneous nephrostomy catheters. Electronically Signed   By: Sandi Mariscal M.D.   On: 05/31/2019 15:01   Ir Nephrostomy Exchange Right  Result Date: 05/31/2019 INDICATION: Routine exchange EXAM: FLUOROSCOPIC GUIDED BILATERAL SIDED NEPHROSTOMY CATHETER EXCHANGE COMPARISON:  Multiple previous fluoroscopic guided exchanges most recently on 04/19/2019 and 03/25/2019. CONTRAST:  A total of 20 mL Isovue-300 administered was administered into both collecting systems FLUOROSCOPY TIME:  36 seconds (9.5 mGy) COMPLICATIONS: None immediate. TECHNIQUE: Informed written consent was obtained from the patient after a discussion of the risks, benefits and alternatives to treatment. Questions regarding the procedure were encouraged and answered. A timeout was performed prior to the initiation of the procedure. The bilateral flanks and external portions of existing nephrostomy catheters were prepped and draped in the usual sterile fashion. A sterile drape was applied covering the operative field. Maximum barrier sterile technique with sterile gowns and gloves were used for the procedure. A timeout was performed prior to the initiation of the procedure. A pre procedural spot fluoroscopic image was obtained. Beginning with the left-sided nephrostomy, a small amount of contrast was injected via the existing left-sided nephrostomy catheter demonstrating appropriate positioning within the renal pelvis. The existing nephrostomy catheter was cut and cannulated with a Benson wire which was coiled within the renal pelvis. Under intermittent fluoroscopic guidance, the existing nephrostomy catheter was exchanged for a new 12 [place] all-purpose drainage catheter. Limited contrast injection confirmed appropriate positioning within the left renal pelvis and a post exchange fluoroscopic image was obtained. The catheter was locked, secured to the skin with an interrupted  suture and reconnected to a gravity bag. The identical repeat procedure was repeated for the contralateral right-sided nephrostomy, ultimately allowing successful exchange of a new 12 [place] all-purpose drainage catheter with end coiled and locked within the right renal pelvis. Dressings were placed. The patient tolerated the above procedures well without immediate postprocedural complication. FINDINGS: The existing nephrostomy catheters are appropriately positioned and functioning. After successful fluoroscopic guided exchange, new bilateral 12 French nephrostomy catheters are coiled and locked within the respective renal pelvises. IMPRESSION: Successful fluoroscopic guided exchange of bilateral 12 French percutaneous nephrostomy catheters. Electronically Signed   By: Sandi Mariscal M.D.   On: 05/31/2019 15:01  Assessment and plan- Patient is a 84 y.o. male with locally advanced muscle invasive urothelial carcinoma now involving bowel causing bowel obstruction status post diverting colostomy.  He is here for a post hospital discharge follow-up  Patient is currently at [facility] and continuing to make slow progress.  However overall his performance status is poor, he now has a bilateral nephrostomy tube colostomy.  He cannot hear and he has severe anemia likely secondary to his recent hospitalization.  I do not think that he would be a treatment candidate in the future.  Patient's wife [name] understands that he does not have any meaningful treatment options but is not quite ready to accept hospice just yet.  I will reevaluate him in a month's time after he gets out of rehab and have further goals of care discussion with him and his wife in person in the negative pressure room downstairs.  Hypokalemia: Patient is not in a position to stay 2 hours to receive IV potassium.  We will therefore send him home with oral potassium 20 mEq daily for a month.   Visit Diagnosis 1. Urothelial cancer ([place])    2. Hypokalemia   3. Anemia of chronic disease      Dr. Randa Evens, MD, MPH [facility] [phone] 06/28/2019 12:47 PM

## 2019-06-28 NOTE — Telephone Encounter (Signed)
Called and spoke to  nannie At [employer]. Let her know that pt potassium was 2.8 and Dr. [name] had wrote on consult form that pt. Needs potassium 20 meq daily x 30 day. She will look at the consult sheet that MD wrote and if she has any problems then she will call me directly

## 2019-07-04 ENCOUNTER — Other Ambulatory Visit: Payer: Self-pay | Admitting: Radiology

## 2019-07-04 DIAGNOSIS — N1339 Other hydronephrosis: Secondary | ICD-10-CM

## 2019-07-08 ENCOUNTER — Other Ambulatory Visit: Payer: Self-pay | Admitting: Radiology

## 2019-07-08 DIAGNOSIS — N390 Urinary tract infection, site not specified: Secondary | ICD-10-CM

## 2019-07-08 DIAGNOSIS — N1339 Other hydronephrosis: Secondary | ICD-10-CM

## 2019-07-08 MED ORDER — CIPROFLOXACIN HCL 250 MG PO TABS: 250.0000 mg | ORAL_TABLET | Freq: Two times a day (BID) | ORAL | 0 refills | Status: DC

## 2019-07-08 MED ORDER — CIPROFLOXACIN HCL 500 MG PO TABS: 500.0000 mg | ORAL_TABLET | Freq: Two times a day (BID) | ORAL | 0 refills | Status: DC

## 2019-07-09 ENCOUNTER — Telehealth: Payer: Self-pay | Admitting: Urology

## 2019-07-09 ENCOUNTER — Other Ambulatory Visit: Payer: Medicare Other

## 2019-07-09 NOTE — Telephone Encounter (Signed)
Kim from [employer] called and needs a call back to discuss pt's procedure and antibiotic. She states that she los t the paper she wrote the info on.

## 2019-07-10 ENCOUNTER — Telehealth: Payer: Self-pay | Admitting: *Deleted

## 2019-07-10 NOTE — Telephone Encounter (Signed)
Wife called to check up on the FMLA for her daughter. I called her back and got her voicemail and let her know that I faxed it to [facility] on 8/26 and I put a copy of it in the mail because the envelope said to mail to daughter also.

## 2019-07-10 NOTE — Telephone Encounter (Signed)
Discussed instructions with Maudie Mercury at [employer]. She expressed understanding of instructions.

## 2019-07-12 ENCOUNTER — Ambulatory Visit: Admission: RE | Admit: 2019-07-12 | Payer: Medicare Other | Source: Ambulatory Visit

## 2019-07-12 ENCOUNTER — Other Ambulatory Visit: Payer: Medicare Other

## 2019-07-17 DIAGNOSIS — Z9049 Acquired absence of other specified parts of digestive tract: Secondary | ICD-10-CM | POA: Insufficient documentation

## 2019-07-19 ENCOUNTER — Other Ambulatory Visit: Payer: Self-pay

## 2019-07-19 ENCOUNTER — Ambulatory Visit
Admission: RE | Admit: 2019-07-19 | Discharge: 2019-07-19 | Disposition: A | Discharge status: Home / Self Care | Payer: Medicare Other | Source: Ambulatory Visit | Attending: Urology | Admitting: Urology

## 2019-07-19 DIAGNOSIS — N1339 Other hydronephrosis: Secondary | ICD-10-CM | POA: Insufficient documentation

## 2019-07-19 DIAGNOSIS — Z436 Encounter for attention to other artificial openings of urinary tract: Secondary | ICD-10-CM | POA: Insufficient documentation

## 2019-07-19 HISTORY — PX: IR NEPHROSTOMY EXCHANGE LEFT: IMG6069

## 2019-07-19 HISTORY — PX: IR NEPHROSTOMY EXCHANGE RIGHT: IMG6070

## 2019-07-19 MED ORDER — IODIXANOL 320 MG/ML IV SOLN
50.0000 mL | Freq: Once | INTRAVENOUS | Status: AC | PRN
  Administered 2019-07-19: 09:00:00 15 mL

## 2019-07-19 NOTE — Procedures (Signed)
Pre Procedure Dx: Hydronephrosis Post Procedure Dx: Same  Successful bilateral PCN exchange.    EBL: None   No immediate complications.   Jay Ragan Duhon, MD Pager #: [phone]  

## 2019-07-19 NOTE — Discharge Instructions (Signed)
Colostomy Home Guide, Adult  Colostomy surgery is done to create an opening in the front of the abdomen for stool (feces) to leave the body through an ostomy (stoma). Part of the large intestine is attached to the stoma. A bag, also called a pouch, is fitted over the stoma. Stool and gas will collect in the bag. After surgery, you will need to empty and change your colostomy bag as needed. You will also need to care for your stoma. How to care for the stoma Your stoma should look pink, red, and moist, like the inside of your cheek. Soon after surgery, the stoma may be swollen, but this swelling will go away within 6 weeks. To care for the stoma:  Keep the skin around the stoma clean and dry.  Use a clean, soft washcloth to gently wash the stoma and the skin around it. Clean using a circular motion, and wipe away from the stoma opening, not toward it. ? Use warm water and only use cleansers recommended by your health care provider. ? Rinse the stoma area with plain water. ? Dry the area around the stoma well.  Use stoma powder or ointment on your skin only as told by your health care provider. Do not use any other powders, gels, wipes, or creams on the skin around the stoma.  Check the stoma area every day for signs of infection. Check for: ? New or worsening redness, swelling, or pain. ? New or increased fluid or blood. ? Pus or warmth.  Measure the stoma opening regularly and record the size. Watch for changes. (It is normal for the stoma to get smaller as swelling goes away.) Share this information with your health care provider. How to empty the colostomy bag  Empty your bag at bedtime and whenever it is one-third to one-half full. Do not let the bag get more than half-full with stool or gas. The bag could leak if it gets too full. Some colostomy bags have a built-in gas release valve that releases gas often throughout the day. Follow these basic steps: 1. Wash your hands with soap and  water. 2. Sit far back on the toilet seat. 3. Put several pieces of toilet paper into the toilet water. This will prevent splashing as you empty stool into the toilet. 4. Remove the clip or the hook-and-loop fastener from the tail end of the bag. 5. Unroll the tail, then empty the stool into the toilet. 6. Clean the tail with toilet paper or a moist towelette. 7. Reroll the tail, and close it with the clip or the hook-and-loop fastener. 8. Wash your hands again. How to change the colostomy bag Change your bag every 3-4 days or as often as told by your health care provider. Also change the bag if it is leaking or separating from the skin, or if your skin around the stoma looks or feels irritated. Irritated skin may be a sign that the bag is leaking. Always have colostomy supplies with you, and follow these basic steps: 1. Wash your hands with soap and water. Have paper towels or tissues nearby to clean any discharge. 2. Remove the old bag and skin barrier. Use your fingers or a warm cloth to gently push the skin away from the barrier. 3. Clean the stoma area with water or with mild soap and water, as directed. Use water to rinse away any soap. 4. Dry the skin. You may use the cool setting on a hair dryer to do this.  5. Use a tracing pattern (template) to cut the skin barrier to the size needed. 6. If you are using a two-piece bag, attach the bag and the skin barrier to each other. Add the barrier ring, if you use one. 7. If directed, apply stoma powder or skin barrier gel to the skin. 8. Warm the skin barrier with your hands, or blow with a hair dryer for 5-10 seconds. 9. Remove the paper from the adhesive strip of the skin barrier. 10. Press the adhesive strip onto the skin around the stoma. 11. Gently rub the skin barrier onto the skin. This creates heat that helps the barrier to stick. 12. Apply stoma tape to the edges of the skin barrier, if desired. 25. Wash your hands again. General  recommendations  Avoid wearing tight clothes or having anything press directly on your stoma or bag. Change your clothing whenever it is soiled or damp.  You may shower or bathe with the bag on or off. Do not use harsh or oily soaps or lotions. Dry the skin and bag after bathing.  Store all supplies in a cool, dry place. Do not leave supplies in extreme heat because some parts can melt or not stick as well.  Whenever you leave home, take extra clothing and an extra skin barrier and bag with you.  If your bag gets wet, you can dry it with a hair dryer on the cool setting.  To prevent odor, you may put drops of ostomy deodorizer in the bag.  If recommended by your health care provider, put ostomy lubricant inside the bag. This helps stool to slide out of the bag more easily and completely. Contact a health care provider if:  You have new or worsening redness, swelling, or pain around your stoma.  You have new or increased fluid or blood coming from your stoma.  Your stoma feels warm to the touch.  You have pus coming from your stoma.  Your stoma extends in or out farther than normal.  You need to change your bag every day.  You have a fever. Get help right away if:  Your stool is bloody.  You have nausea or you vomit.  You have trouble breathing. Summary  Measure your stoma opening regularly and record the size. Watch for changes.  Empty your bag at bedtime and whenever it is one-third to one-half full. Do not let the bag get more than half-full with stool or gas.  Change your bag every 3-4 days or as often as told by your health care provider.  Whenever you leave home, take extra clothing and an extra skin barrier and bag with you. This information is not intended to replace advice given to you by your health care provider. Make sure you discuss any questions you have with your health care provider. Document Released: 10/27/2003 Document Revised: 02/13/2019 Document  Reviewed: 04/19/2017 Elsevier Patient Education  [place]. Percutaneous Nephrostomy, Care After This sheet gives you information about how to care for yourself after your procedure. Your health care provider may also give you more specific instructions. If you have problems or questions, contact your health care provider. What can I expect after the procedure? After the procedure, it is common to have:  Some soreness where the nephrostomy tube was inserted (tube insertion site).  Blood-tinged drainage from the nephrostomy tube for the first 24 hours. Follow these instructions at home: Activity  Return to your normal activities as told by your health care provider.  Ask your health care provider what activities are safe for you.  Avoid activities that may cause the nephrostomy tubing to bend.  Do not take baths, swim, or use a hot tub until your health care provider approves. Ask your health care provider if you can take showers. Cover the nephrostomy tube dressing with a watertight covering when you take a shower.  Donot drive for 24 hours if you were given a medicine to help you relax (sedative). Care of the tube insertion site   Follow instructions from your health care provider about how to take care of your tube insertion site. Make sure you: ? Wash your hands with soap and water before you change your bandage (dressing). If soap and water are not available, use hand sanitizer. ? Change your dressing as told by your health care provider. Be careful not to pull on the tube while removing the dressing. ? When you change the dressing, wash the skin around the tube, rinse well, and pat the skin dry.  Check the tube insertion area every day for signs of infection. Check for: ? More redness, swelling, or pain. ? More fluid or blood. ? Warmth. ? Pus or a bad smell. Care of the nephrostomy tube and drainage bag  Always keep the tubing, the leg bag, or the bedside drainage  bags below the level of the kidney so that your urine drains freely.  When connecting your nephrostomy tube to a drainage bag, make sure that there are no kinks in the tubing and that your urine is draining freely. You may want to use an elastic bandage to wrap any exposed tubing that goes from the nephrostomy tube to any of the connecting tubes.  At night, you may want to connect your nephrostomy tube or the leg bag to a larger bedside drainage bag.  Follow instructions from your health care provider about how to empty or change the drainage bag.  Empty the drainage bag when it becomes ? full.  Replace the drainage bag and any extension tubing that is connected to your nephrostomy tube every 3 weeks or as often as told by your health care provider. Your health care provider will explain how to change the drainage bag and extension tubing. General instructions  Take over-the-counter and prescription medicines only as told by your health care provider.  Keep all follow-up visits as told by your health care provider. This is important. Contact a health care provider if:  You have problems with any of the valves or tubing.  You have persistent pain or soreness in your back.  You have more redness, swelling, or pain around your tube insertion site.  You have more fluid or blood coming from your tube insertion site.  Your tube insertion site feels warm to the touch.  You have pus or a bad smell coming from your tube insertion site.  You have increased urine output or you feel burning when urinating. Get help right away if:  You have pain in your abdomen during the first week.  You have chest pain or have trouble breathing.  You have a new appearance of blood in your urine.  You have a fever or chills.  You have back pain that is not relieved by your medicine.  You have decreased urine output.  Your nephrostomy tube comes out. This information is not intended to replace advice  given to you by your health care provider. Make sure you discuss any questions you have with your health  care provider. Document Released: 06/16/2004 Document Revised: 10/06/2017 Document Reviewed: 08/05/2016 Elsevier Patient Education  2020 [employer].

## 2019-07-30 ENCOUNTER — Other Ambulatory Visit: Payer: Self-pay

## 2019-07-30 ENCOUNTER — Encounter: Payer: Self-pay | Admitting: Oncology

## 2019-07-30 ENCOUNTER — Inpatient Hospital Stay (HOSPITAL_BASED_OUTPATIENT_CLINIC_OR_DEPARTMENT_OTHER): Payer: Medicare Other | Admitting: Oncology

## 2019-07-30 ENCOUNTER — Inpatient Hospital Stay: Payer: Medicare Other | Attending: Oncology

## 2019-07-30 VITALS — BP 106/77 | HR 100 | Temp 95.3°F | Resp 18 | Wt 141.0 lb

## 2019-07-30 DIAGNOSIS — M1990 Unspecified osteoarthritis, unspecified site: Secondary | ICD-10-CM | POA: Insufficient documentation

## 2019-07-30 DIAGNOSIS — Z87440 Personal history of urinary (tract) infections: Secondary | ICD-10-CM | POA: Insufficient documentation

## 2019-07-30 DIAGNOSIS — R54 Age-related physical debility: Secondary | ICD-10-CM | POA: Insufficient documentation

## 2019-07-30 DIAGNOSIS — C61 Malignant neoplasm of prostate: Secondary | ICD-10-CM | POA: Insufficient documentation

## 2019-07-30 DIAGNOSIS — K219 Gastro-esophageal reflux disease without esophagitis: Secondary | ICD-10-CM | POA: Insufficient documentation

## 2019-07-30 DIAGNOSIS — I10 Essential (primary) hypertension: Secondary | ICD-10-CM | POA: Diagnosis not present

## 2019-07-30 DIAGNOSIS — Z933 Colostomy status: Secondary | ICD-10-CM | POA: Diagnosis not present

## 2019-07-30 DIAGNOSIS — Z79899 Other long term (current) drug therapy: Secondary | ICD-10-CM | POA: Diagnosis not present

## 2019-07-30 DIAGNOSIS — F419 Anxiety disorder, unspecified: Secondary | ICD-10-CM | POA: Insufficient documentation

## 2019-07-30 DIAGNOSIS — Z7189 Other specified counseling: Secondary | ICD-10-CM | POA: Diagnosis not present

## 2019-07-30 DIAGNOSIS — C679 Malignant neoplasm of bladder, unspecified: Secondary | ICD-10-CM | POA: Insufficient documentation

## 2019-07-30 DIAGNOSIS — D72829 Elevated white blood cell count, unspecified: Secondary | ICD-10-CM | POA: Insufficient documentation

## 2019-07-30 DIAGNOSIS — R5381 Other malaise: Secondary | ICD-10-CM | POA: Insufficient documentation

## 2019-07-30 DIAGNOSIS — C689 Malignant neoplasm of urinary organ, unspecified: Secondary | ICD-10-CM

## 2019-07-30 LAB — COMPREHENSIVE METABOLIC PANEL
ALT: 12 U/L (ref 0–44)
AST: 12 U/L — ABNORMAL LOW (ref 15–41)
Albumin: 3.2 g/dL — ABNORMAL LOW (ref 3.5–5.0)
Alkaline Phosphatase: 72 U/L (ref 38–126)
Anion gap: 12 (ref 5–15)
BUN: 18 mg/dL (ref 8–23)
CO2: 24 mmol/L (ref 22–32)
Calcium: 9.1 mg/dL (ref 8.9–10.3)
Chloride: 99 mmol/L (ref 98–111)
Creatinine, Ser: 1.04 mg/dL (ref 0.61–1.24)
GFR calc Af Amer: >60 mL/min (ref >60)
GFR calc non Af Amer: >60 mL/min (ref >60)
Glucose, Bld: 124 mg/dL — ABNORMAL HIGH (ref 70–99)
Potassium: 4 mmol/L (ref 3.5–5.1)
Sodium: 135 mmol/L (ref 135–145)
Total Bilirubin: 0.3 mg/dL (ref 0.3–1.2)
Total Protein: 7.6 g/dL (ref 6.5–8.1)

## 2019-07-30 LAB — CBC WITH DIFFERENTIAL/PLATELET
Abs Immature Granulocytes: 0.18 10*3/uL — ABNORMAL HIGH (ref 0.00–0.07)
Basophils Absolute: 0.1 10*3/uL (ref 0.0–0.1)
Basophils Relative: 0 %
Eosinophils Absolute: 0.2 10*3/uL (ref 0.0–0.5)
Eosinophils Relative: 1 %
HCT: 37.2 % — ABNORMAL LOW (ref 39.0–52.0)
Hemoglobin: 11.6 g/dL — ABNORMAL LOW (ref 13.0–17.0)
Immature Granulocytes: 1 %
Lymphocytes Relative: 10 %
Lymphs Abs: 2.2 10*3/uL (ref 0.7–4.0)
MCH: 28 pg (ref 26.0–34.0)
MCHC: 31.2 g/dL (ref 30.0–36.0)
MCV: 89.6 fL (ref 80.0–100.0)
Monocytes Absolute: 1.2 10*3/uL — ABNORMAL HIGH (ref 0.1–1.0)
Monocytes Relative: 6 %
Neutro Abs: 18.2 10*3/uL — ABNORMAL HIGH (ref 1.7–7.7)
Neutrophils Relative %: 82 %
Platelets: 422 10*3/uL — ABNORMAL HIGH (ref 150–400)
RBC: 4.15 MIL/uL — ABNORMAL LOW (ref 4.22–5.81)
RDW: 16.6 % — ABNORMAL HIGH (ref 11.5–15.5)
WBC: 22.1 10*3/uL — ABNORMAL HIGH (ref 4.0–10.5)
nRBC: 0 % (ref 0.0–0.2)

## 2019-07-30 NOTE — Progress Notes (Signed)
Patient is coming in for follow up he is doing well no major complaints  

## 2019-07-31 ENCOUNTER — Telehealth: Payer: Self-pay | Admitting: *Deleted

## 2019-07-31 NOTE — Telephone Encounter (Signed)
Called the new cell phone that wife has now and made changes in the computer with the new number. Left message about ct scan for 10/1 at 9:00 am and arrive at least 15  min early for the appt. To come and get prep kit at least 1 day earlier than scan. NPO x 4 hours prior toscan. I spoke to her yest. About the scan but I did not have a date yet. I gave her my phone number to call me back if she has questions.

## 2019-08-01 MED ORDER — ONDANSETRON HCL 8 MG PO TABS: 8.0000 mg | ORAL_TABLET | Freq: Two times a day (BID) | ORAL | 1 refills | Status: AC | PRN

## 2019-08-01 MED ORDER — DEXAMETHASONE 4 MG PO TABS: 8.0000 mg | ORAL_TABLET | Freq: Every day | ORAL | 1 refills | Status: DC

## 2019-08-01 MED ORDER — PROCHLORPERAZINE MALEATE 10 MG PO TABS: 10.0000 mg | ORAL_TABLET | Freq: Four times a day (QID) | ORAL | 1 refills | Status: AC | PRN

## 2019-08-01 MED ORDER — LIDOCAINE-PRILOCAINE 2.5-2.5 % EX CREA: TOPICAL_CREAM | CUTANEOUS | 3 refills | Status: DC

## 2019-08-01 NOTE — Progress Notes (Signed)
DISCONTINUE OFF PATHWAY REGIMEN - Bladder   [id number]:Atezolizumab 1,200 mg q21 Days:   A cycle is every 21 days:     Atezolizumab   **Always confirm dose/schedule in your pharmacy ordering system**  REASON: Disease Progression PRIOR TREATMENT: Off Pathway: Atezolizumab 1,200 mg q21 Days TREATMENT RESPONSE: Progressive Disease (PD)  START ON PATHWAY REGIMEN - Bladder     A cycle is every 28 days:     Enfortumab vedotin-ejfv   **Always confirm dose/schedule in your pharmacy ordering system**  Patient Characteristics: Advanced/Metastatic Disease, Third Line and Beyond Therapeutic Status: Advanced/Metastatic Disease Line of Therapy: Third Line and Beyond  Intent of Therapy: Non-Curative / Palliative Intent, Discussed with Patient

## 2019-08-01 NOTE — Progress Notes (Signed)
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 For his prostate cancer he has received IM RT in the past. He has been seeing Dr. Erlene Quan in the past for his recurrent UTIs as well as hematuria. CT scan in July 2018 showed bladder wall thickening with perivascular edema and inflammation in the right ureter greater than the left. Findings were thought to be due to pyelonephritis at that time. He underwent cystoscopy on 12/14/2017 which showed abnormal looking prostate with necrotic material lining the entire surface area. Diffuse copious debris within the bladder appearing to be erythematous without discrete bladder tumor but visualization was poor. He was then admitted to the hospital on 12/26/2017 with symptoms of UTI and sepsis as well as new moderate bilateral hydronephrosis.  CT abdomen and pelvis with contrast on 12/20/2017 again showed market bile bladder wall thickening with perivesicular edema. Within the lumen of the bladder there is a 93.9 cm soft tissue attenuating filling defect. This is indeterminate and could represent an area of blood clot versus urothelial lesion.  He underwent repeat cystoscopy with bilateral pyelogram and ureteral stent placement as well as TURBT and TURP. He was found to have a  massive tumor involving the majority of the bladder with grossly necrotic and calcified material. There appeared to be multifocal disease with large burden of the left anterior bladder wall extending posteriorly as well as adjacent to the bladder neck and beyond the right hemitrigone. Very little normal recognizable bladder mucosa remaining.   Biopsy from TURBT and TURP showed: High-grade urothelial carcinoma with extensive necrosisinvolving both the bladder and the prostate.CT chest did not reveal any evidence of metastatic disease. He was also not found to have any regional adenopathy on CT abdomen  Dr. Erlene Quan performed interval TURBT on 01/15/18 and was able to debulk tumor as much as possible to reduce tumor burden  Patient received 5 cycles ofcarboplatin/ gemcitabine 2 weeks on and 1 week off ending 04/26/18.Patient did receive radiation for 10 days during cycle 2 of treatment. Given patients age, co-morbidities and frailty- he was not a cisplatin candidate.6th cycle not given due to fall and hip fracture  Patient had a repeat cystoscopy in September 2019 which showed no obvious tumor bladder but it did have a shaggy necrotic appearance at the bladder neck. Prostatic fossa was grossly abnormal necrotic and irregular without papillary change. Bladder neck was biopsied and showed residual high-grade urothelial carcinoma. Muscularis propria was not seen in that specimen.Due to evidence of recurrent/residual disease patient was started on Tecentriq on 07/26/2018  Patient noted to have local progression of his bladder cancer causing bowel obstruction in July 2020.  He is status post diverting colostomy.  He also has bilateral nephrostomies in place.   Interval history-patient is currently out of the rehab and has been at home.  He is able to stand up and hold onto a wheelchair with assistance and able to ambulate around the house  with a wheelchair.  He is extremely hard of hearing  and communication with him has been mainly through writing but his wife [name] was also over the phone today he reports that overall he has improved a whole lot and back to his baseline.  Even prior to his hospitalization in July patient has been frail but has been able to do his ADLs with assistance  ECOG PS- 2 Pain scale- 0  Review of systems- Review of Systems  Constitutional: Positive for malaise/fatigue. Negative for chills, fever and weight loss.  HENT: Negative for congestion, ear discharge and nosebleeds.   Eyes: Negative for blurred vision.  Respiratory: Negative for cough, hemoptysis, sputum production, shortness of breath and wheezing.   Cardiovascular: Negative for chest pain, palpitations, orthopnea and claudication.  Gastrointestinal: Negative for abdominal pain, blood in stool, constipation, diarrhea, heartburn, melena, nausea and vomiting.  Genitourinary: Negative for dysuria, flank pain, frequency, hematuria and urgency.  Musculoskeletal: Negative for back pain, joint pain and myalgias.  Skin: Negative for rash.  Neurological: Negative for dizziness, tingling, focal weakness, seizures, weakness and headaches.  Endo/Heme/Allergies: Does not bruise/bleed easily.  Psychiatric/Behavioral: Negative for depression and suicidal ideas. The patient does not have insomnia.       No Known Allergies   Past Medical History:  Diagnosis Date   Anxiety    Arthritis    Bladder cancer ([place])    Dysrhythmia 07/2018   history of atrial flutter that worsens with anxiety   Femur fracture, right ([place]) 05/01/2018   GERD (gastroesophageal reflux disease)    History of recent blood transfusion 05/2018   Hypertension    Iron deficiency anemia 09/14/2018   Prostate cancer ([place]) 07/2018   cancer growing in prostate but not prostate cancer, it is from the bladder   Umbilical hernia 59/9357   Urinary retention 2019   foley catheter place 11/2017   UTI (urinary tract infection) 2019    frequent UTI's over last year   Wound eschar of foot 07/2018   left heal getting wrapped and requiring antibiotic cream. cracks open with weight bearing.     Past Surgical History:  Procedure Laterality Date   CARPAL TUNNEL RELEASE Right    CHOLECYSTECTOMY  2004   COLOSTOMY N/A 06/08/2019   Procedure: COLOSTOMY;  Surgeon: Herbert Pun, MD;  Location: [facility];  Service: General;  Laterality: N/A;   CYSTOGRAM  07/18/2018   Procedure: CYSTOGRAM;  Surgeon: [name], MD;  Location: [facility];  Service: Urology;;   Consuela Mimes W/ RETROGRADES Bilateral 07/18/2018   Procedure: CYSTOSCOPY WITH RETROGRADE PYELOGRAM;  Surgeon: [name], MD;  Location: [facility];  Service: Urology;  Laterality: Bilateral;   CYSTOSCOPY W/ URETERAL STENT PLACEMENT Bilateral 12/27/2017   Procedure: CYSTOSCOPY WITH RETROGRADE PYELOGRAM/URETERAL STENT PLACEMENT;  Surgeon: [name], MD;  Location: [facility];  Service: Urology;  Laterality: Bilateral;   CYSTOSCOPY W/ URETERAL STENT PLACEMENT Bilateral 07/18/2018   Procedure: CYSTOSCOPY WITH STENT REPLACEMENT (exchange);  Surgeon: [name], MD;  Location: [facility];  Service: Urology;  Laterality: Bilateral;   CYSTOSCOPY WITH STENT PLACEMENT Bilateral 11/12/2018   Procedure: [place] WITH STENT Exchange;  Surgeon: [name], MD;  Location: [facility];  Service: Urology;  Laterality: Bilateral;   INTRAMEDULLARY (IM) NAIL INTERTROCHANTERIC Right 05/02/2018   Procedure: INTRAMEDULLARY (IM) NAIL INTERTROCHANTRIC;  Surgeon: [name], MD;  Location: [facility];  Service: Orthopedics;  Laterality: Right;   IR NEPHROSTOMY EXCHANGE LEFT  03/25/2019   IR NEPHROSTOMY EXCHANGE LEFT  04/19/2019   IR  NEPHROSTOMY EXCHANGE LEFT  05/31/2019   IR NEPHROSTOMY EXCHANGE LEFT  07/19/2019   IR NEPHROSTOMY EXCHANGE RIGHT  03/25/2019   IR NEPHROSTOMY EXCHANGE RIGHT  04/19/2019   IR NEPHROSTOMY EXCHANGE RIGHT  05/31/2019   IR NEPHROSTOMY EXCHANGE  RIGHT  07/19/2019   IR NEPHROSTOMY PLACEMENT LEFT  02/23/2019   IR NEPHROSTOMY PLACEMENT RIGHT  02/23/2019   LAPAROTOMY N/A 06/08/2019   Procedure: EXPLORATORY LAPAROTOMY;  Surgeon: Herbert Pun, MD;  Location: [facility];  Service: General;  Laterality: N/A;   LEG TENDON SURGERY Right 1958   PORTA CATH INSERTION N/A 01/22/2018   Procedure: PORTA CATH INSERTION;  Surgeon: [name], MD;  Location: [facility] [facility];  Service: Cardiovascular;  Laterality: N/A;   TRANSURETHRAL RESECTION OF BLADDER TUMOR N/A 12/27/2017   Procedure: TRANSURETHRAL RESECTION OF BLADDER TUMOR (TURBT);  Surgeon: [name], MD;  Location: [facility];  Service: Urology;  Laterality: N/A;   TRANSURETHRAL RESECTION OF BLADDER TUMOR N/A 01/15/2018   Procedure: TRANSURETHRAL RESECTION OF BLADDER TUMOR (TURBT);  Surgeon: [name], MD;  Location: [facility];  Service: Urology;  Laterality: N/A;  Need 2 hrs for this case please   TRANSURETHRAL RESECTION OF BLADDER TUMOR N/A 07/18/2018   Procedure: TRANSURETHRAL RESECTION OF BLADDER TUMOR (TURBT);  Surgeon: [name], MD;  Location: [facility];  Service: Urology;  Laterality: N/A;    Social History   Socioeconomic History   Marital status: Married    Spouse name: ruth   Number of children: Not on file   Years of education: Not on file   Highest education level: Not on file  Occupational History   Occupation: retired    Comment: Barista strain: Patient refused   Food insecurity    Worry: Patient refused    Inability: Patient refused   Transportation needs    Medical: Patient refused    Non-medical: Patient refused  Tobacco Use   Smoking status: Never Smoker   Smokeless tobacco: Never Used  Substance and Sexual Activity   Alcohol use: No    Alcohol/week: 0.0 standard drinks   Drug use: No   Sexual activity: Not Currently  Lifestyle   Physical activity    Days per week: Patient  refused    Minutes per session: Patient refused   Stress: Patient refused  Relationships   Social connections    Talks on phone: Patient refused    Gets together: Patient refused    Attends religious service: Patient refused    Active member of club or organization: Patient refused    Attends meetings of clubs or organizations: Patient refused    Relationship status: Patient refused   Intimate partner violence    Fear of current or ex partner: Patient refused    Emotionally abused: Patient refused    Physically abused: Patient refused    Forced sexual activity: Patient refused  Other Topics Concern   Not on file  Social History Narrative   Not on file    Family History  Problem Relation Age of Onset   Cancer Mother    Chronic Renal Failure Mother    Heart disease Father      Current Outpatient Medications:    acetaminophen (TYLENOL) 500 MG tablet, Take 1,000 mg by mouth every 6 (six) hours as needed for moderate pain or headache. , Disp: , Rfl:    cetirizine (ZYRTEC) 10 MG tablet, Take 10 mg by mouth daily as needed for allergies. ,  Disp: , Rfl:    Cranberry-Cholecalciferol (SUPER CRANBERRY/VITAMIN D3) 4200-500 MG-UNIT CAPS, Take 1 capsule by mouth 2 (two) times daily., Disp: , Rfl:    feeding supplement, ENSURE ENLIVE, (ENSURE ENLIVE) LIQD, Take 237 mLs by mouth 2 (two) times daily between meals., Disp: 237 mL, Rfl: 12   ferrous sulfate 325 (65 FE) MG tablet, Take 1 tablet (325 mg total) by mouth 2 (two) times daily with a meal., Disp: , Rfl: 3   lidocaine-prilocaine (EMLA) cream, Apply 1 application topically as needed., Disp: 30 g, Rfl: 1   Multiple Vitamin (MULTIVITAMIN WITH MINERALS) TABS tablet, Take 1 tablet by mouth daily., Disp: , Rfl:    MYRBETRIQ 50 MG TB24 tablet, TAKE 1 TABLET BY MOUTH DAILY, Disp: 30 tablet, Rfl: 3   nystatin (NYAMYC) powder, Apply topically 2 (two) times daily., Disp: 60 g, Rfl: 3   oxyCODONE (OXY IR/ROXICODONE) 5 MG immediate  release tablet, Take 1 tablet (5 mg total) by mouth every 4 (four) hours as needed for severe pain., Disp: 60 tablet, Rfl: 0   polyethylene glycol powder (MIRALAX) powder, Take 17 g by mouth daily as needed. Can increase to 3 times a day as needed for constipation but hold medication if has diarrhea, Disp: 255 g, Rfl: 0   vitamin B-12 (CYANOCOBALAMIN) 500 MCG tablet, Take 500 mcg by mouth daily., Disp: , Rfl:    zinc oxide 20 % ointment, Apply 1 application topically as needed for irritation., Disp: , Rfl:    ciprofloxacin (CIPRO) 500 MG tablet, Take 1 tablet (500 mg total) by mouth 2 (two) times daily., Disp: 6 tablet, Rfl: 0  Physical exam:  Vitals:   07/30/19 0953  BP: 106/77  Pulse: 100  Resp: 18  Temp: (!) 95.3 F (35.2 C)  TempSrc: Tympanic  SpO2: 96%  Weight: 141 lb (64 kg)   Physical Exam Constitutional:      Comments: Frail elderly tremulous gentleman sitting in a wheelchair.  Appears in no acute distress  HENT:     Head: Normocephalic and atraumatic.  Eyes:     Pupils: Pupils are equal, round, and reactive to light.  Neck:     Musculoskeletal: Normal range of motion.  Cardiovascular:     Rate and Rhythm: Normal rate and regular rhythm.     Heart sounds: Normal heart sounds.  Pulmonary:     Effort: Pulmonary effort is normal.     Breath sounds: Normal breath sounds.  Abdominal:     General: Bowel sounds are normal.     Palpations: Abdomen is soft.     Comments: Bilateral nephrostomy tubes in place.  Colostomy in place  Skin:    General: Skin is warm and dry.  Neurological:     Mental Status: He is alert and oriented to person, place, and time.      CMP Latest Ref Rng & Units 07/30/2019  Glucose 70 - 99 mg/dL 124(H)  BUN 8 - 23 mg/dL 18  Creatinine 0.61 - 1.24 mg/dL 1.04  Sodium 135 - 145 mmol/L 135  Potassium 3.5 - 5.1 mmol/L 4.0  Chloride 98 - 111 mmol/L 99  CO2 22 - 32 mmol/L 24  Calcium 8.9 - 10.3 mg/dL 9.1  Total Protein 6.5 - 8.1 g/dL 7.6  Total  Bilirubin 0.3 - 1.2 mg/dL 0.3  Alkaline Phos 38 - 126 U/L 72  AST 15 - 41 U/L 12(L)  ALT 0 - 44 U/L 12   CBC Latest Ref Rng & Units 07/30/2019  WBC  4.0 - 10.5 K/uL 22.1(H)  Hemoglobin 13.0 - 17.0 g/dL 11.6(L)  Hematocrit 39.0 - 52.0 % 37.2(L)  Platelets 150 - 400 K/uL 422(H)    No images are attached to the encounter.  Ir Nephrostomy Exchange Left  Result Date: 07/19/2019 INDICATION: History of bladder cancer with chronic bilateral percutaneous nephrostomy catheters. Please perform fluoroscopic guided exchange. Note, since the time of the patient's previous exchange, he has undergone a diverting colostomy secondary to direct colonic erosion of his bladder cancer and subsequent obstruction. EXAM: FLUOROSCOPIC GUIDED BILATERAL SIDED NEPHROSTOMY CATHETER EXCHANGE COMPARISON:  Multiple previous fluoroscopic guided nephrostomy catheter exchanges, most recently on 05/31/2019; CT abdomen pelvis-06/06/2019 CONTRAST:  A total of 15 mL Isovue-300 administered was administered into both collecting systems FLUOROSCOPY TIME:  36 seconds (13 mGy) COMPLICATIONS: None immediate. TECHNIQUE: Informed written consent was obtained from the patient after a discussion of the risks, benefits and alternatives to treatment. Questions regarding the procedure were encouraged and answered. A timeout was performed prior to the initiation of the procedure. The bilateral flanks and external portions of existing nephrostomy catheters were prepped and draped in the usual sterile fashion. A sterile drape was applied covering the operative field. Maximum barrier sterile technique with sterile gowns and gloves were used for the procedure. A timeout was performed prior to the initiation of the procedure. A pre procedural spot fluoroscopic image was obtained. Beginning with the left-sided nephrostomy, a small amount of contrast was injected via the existing left-sided nephrostomy catheter demonstrating appropriate positioning within the  renal pelvis. The existing nephrostomy catheter was cut and cannulated with a Benson wire which was coiled within the renal pelvis. Under intermittent fluoroscopic guidance, the existing nephrostomy catheter was exchanged for a new 12 [place] all-purpose drainage catheter. Limited contrast injection confirmed appropriate positioning within the left renal pelvis and a post exchange fluoroscopic image was obtained. The catheter was locked, secured to the skin with an interrupted suture and reconnected to a gravity bag. The identical repeat procedure was repeated for the contralateral right-sided nephrostomy, ultimately allowing successful exchange of a new 12 [place] all-purpose drainage catheter with end coiled and locked within the right renal pelvis. Dressings were placed. The patient tolerated the above procedures well without immediate postprocedural complication. FINDINGS: The existing nephrostomy catheters are appropriately positioned and functioning. After successful fluoroscopic guided exchange, new bilateral 12 French nephrostomy catheters are coiled and locked within the respective renal pelvises. IMPRESSION: Successful fluoroscopic guided exchange of bilateral 12 French percutaneous nephrostomy catheters. Electronically Signed   By: Sandi Mariscal M.D.   On: 07/19/2019 12:33   Ir Nephrostomy Exchange Right  Result Date: 07/19/2019 INDICATION: History of bladder cancer with chronic bilateral percutaneous nephrostomy catheters. Please perform fluoroscopic guided exchange. Note, since the time of the patient's previous exchange, he has undergone a diverting colostomy secondary to direct colonic erosion of his bladder cancer and subsequent obstruction. EXAM: FLUOROSCOPIC GUIDED BILATERAL SIDED NEPHROSTOMY CATHETER EXCHANGE COMPARISON:  Multiple previous fluoroscopic guided nephrostomy catheter exchanges, most recently on 05/31/2019; CT abdomen pelvis-06/06/2019 CONTRAST:  A total of 15 mL Isovue-300  administered was administered into both collecting systems FLUOROSCOPY TIME:  36 seconds (13 mGy) COMPLICATIONS: None immediate. TECHNIQUE: Informed written consent was obtained from the patient after a discussion of the risks, benefits and alternatives to treatment. Questions regarding the procedure were encouraged and answered. A timeout was performed prior to the initiation of the procedure. The bilateral flanks and external portions of existing nephrostomy catheters were prepped and draped in the usual sterile  fashion. A sterile drape was applied covering the operative field. Maximum barrier sterile technique with sterile gowns and gloves were used for the procedure. A timeout was performed prior to the initiation of the procedure. A pre procedural spot fluoroscopic image was obtained. Beginning with the left-sided nephrostomy, a small amount of contrast was injected via the existing left-sided nephrostomy catheter demonstrating appropriate positioning within the renal pelvis. The existing nephrostomy catheter was cut and cannulated with a Benson wire which was coiled within the renal pelvis. Under intermittent fluoroscopic guidance, the existing nephrostomy catheter was exchanged for a new 12 [place] all-purpose drainage catheter. Limited contrast injection confirmed appropriate positioning within the left renal pelvis and a post exchange fluoroscopic image was obtained. The catheter was locked, secured to the skin with an interrupted suture and reconnected to a gravity bag. The identical repeat procedure was repeated for the contralateral right-sided nephrostomy, ultimately allowing successful exchange of a new 12 [place] all-purpose drainage catheter with end coiled and locked within the right renal pelvis. Dressings were placed. The patient tolerated the above procedures well without immediate postprocedural complication. FINDINGS: The existing nephrostomy catheters are appropriately positioned and  functioning. After successful fluoroscopic guided exchange, new bilateral 12 French nephrostomy catheters are coiled and locked within the respective renal pelvises. IMPRESSION: Successful fluoroscopic guided exchange of bilateral 12 French percutaneous nephrostomy catheters. Electronically Signed   By: Sandi Mariscal M.D.   On: 07/19/2019 12:33     Assessment and plan- Patient is a 84 y.o. male with locally advanced muscle invasive urothelial carcinoma now involving bowel causing bowel obstruction status post diverting colostomy.  This is a routine visit to assess patient's ongoing performance status as well as further goals of care  Patient has been frail since the time of his diagnosis in March 2019.  He has gone through chemotherapy followed by [facility] for his locally advanced bladder cancer and he tolerated well.  In July 2020 he was noted to have disease progression with involvement of his colon causing bowel obstruction status post diverting colostomy.  He was also noted to have hydronephrosis likely secondary to bladder tumor and has bilateral nephrostomy tubes in place.  Back in July 2020 patient's performance status has deteriorated to the point that hospice was strongly being considered however patient did not want to pursue hospice at that time and following his surgery he was at the rehab.  Patient has been discharged from the rehab and has improved his stamina.  His baseline performance status is 2 and he seems to have gone back to his baseline.  Today's blood work shows that his hemoglobin is much improved to 11.6 from a prior value of 7.  He does have some baseline leukocytosis likely secondary to malignancy.  Renal functions have also normalized.  I had a lengthy conversation with the patient's wife and also explained everything to the patient in writing.  Certainly hospice is a reasonable consideration at this time if patient does not want to pursue any active treatment.  However given his  performance status is improved we could give him a trial of Padcev.  I would like to dose reduce it at 1 mg per metered square and give it 2 weeks on 1 week off instead of 3 weeks on 1 week off.  Discussed risks and benefits of Padcev including all but not limited to low blood counts, risk of peripheral neuropathy infections and hospitalization.  Treatment is being given with a palliative intent.  Patient and his wife  would like to proceed with a trial of Padcev.  They both understand that if he does not respond to this treatment then hospice would be the next step.  I will also check FGFR testing on his tumor specimen to see if he would be a candidate for [place].  Since it will take close to 3 weeks for FGFR testing to come back I will proceed with Padcev in the meanwhile  It has been close to 13-month since his last scans and I will proceed with another CT chest abdomen and pelvis with contrast to get a baseline assessment of his disease   Total face to face encounter time for this patient visit was 40 min. >50% of the time was  spent in counseling and coordination of care.     Visit Diagnosis 1. Urothelial cancer ([place])   2. Goals of care, counseling/discussion   3. Malignant neoplasm of urinary bladder, unspecified site [facility])      Dr. Randa Evens, MD, MPH [facility] [phone] 08/01/2019 9:06 AM

## 2019-08-08 ENCOUNTER — Other Ambulatory Visit: Payer: Self-pay

## 2019-08-08 ENCOUNTER — Encounter: Payer: Self-pay | Admitting: Oncology

## 2019-08-08 ENCOUNTER — Ambulatory Visit
Admission: RE | Admit: 2019-08-08 | Discharge: 2019-08-08 | Disposition: A | Discharge status: Home / Self Care | Payer: Medicare Other | Source: Ambulatory Visit | Attending: Oncology | Admitting: Oncology

## 2019-08-08 DIAGNOSIS — I2510 Atherosclerotic heart disease of native coronary artery without angina pectoris: Secondary | ICD-10-CM | POA: Diagnosis not present

## 2019-08-08 DIAGNOSIS — J90 Pleural effusion, not elsewhere classified: Secondary | ICD-10-CM | POA: Diagnosis not present

## 2019-08-08 DIAGNOSIS — Z9079 Acquired absence of other genital organ(s): Secondary | ICD-10-CM | POA: Insufficient documentation

## 2019-08-08 DIAGNOSIS — Z933 Colostomy status: Secondary | ICD-10-CM | POA: Insufficient documentation

## 2019-08-08 DIAGNOSIS — C689 Malignant neoplasm of urinary organ, unspecified: Secondary | ICD-10-CM | POA: Insufficient documentation

## 2019-08-08 DIAGNOSIS — N62 Hypertrophy of breast: Secondary | ICD-10-CM | POA: Diagnosis not present

## 2019-08-08 MED ORDER — IOHEXOL 300 MG/ML  SOLN
100.0000 mL | Freq: Once | INTRAMUSCULAR | Status: AC | PRN
  Administered 2019-08-08: 100 mL via INTRAVENOUS

## 2019-08-08 NOTE — Progress Notes (Signed)
Called patient's wife no answer left message

## 2019-08-08 NOTE — Progress Notes (Signed)
Pt to have scan results and possibility of starting new treatment.

## 2019-08-09 ENCOUNTER — Inpatient Hospital Stay (HOSPITAL_BASED_OUTPATIENT_CLINIC_OR_DEPARTMENT_OTHER): Payer: Medicare Other | Admitting: Oncology

## 2019-08-09 ENCOUNTER — Encounter: Payer: Self-pay | Admitting: Oncology

## 2019-08-09 ENCOUNTER — Inpatient Hospital Stay: Payer: Medicare Other | Attending: Oncology

## 2019-08-09 ENCOUNTER — Inpatient Hospital Stay: Payer: Medicare Other

## 2019-08-09 ENCOUNTER — Other Ambulatory Visit: Payer: Self-pay

## 2019-08-09 VITALS — BP 109/65 | HR 98 | Resp 18

## 2019-08-09 VITALS — BP 113/69 | HR 113 | Temp 95.6°F | Resp 16 | Ht 71.0 in | Wt 139.0 lb

## 2019-08-09 DIAGNOSIS — Z923 Personal history of irradiation: Secondary | ICD-10-CM | POA: Insufficient documentation

## 2019-08-09 DIAGNOSIS — C689 Malignant neoplasm of urinary organ, unspecified: Secondary | ICD-10-CM | POA: Insufficient documentation

## 2019-08-09 DIAGNOSIS — Z933 Colostomy status: Secondary | ICD-10-CM | POA: Diagnosis not present

## 2019-08-09 DIAGNOSIS — Z87440 Personal history of urinary (tract) infections: Secondary | ICD-10-CM | POA: Diagnosis not present

## 2019-08-09 DIAGNOSIS — D638 Anemia in other chronic diseases classified elsewhere: Secondary | ICD-10-CM | POA: Insufficient documentation

## 2019-08-09 DIAGNOSIS — Z936 Other artificial openings of urinary tract status: Secondary | ICD-10-CM | POA: Diagnosis not present

## 2019-08-09 DIAGNOSIS — F419 Anxiety disorder, unspecified: Secondary | ICD-10-CM | POA: Insufficient documentation

## 2019-08-09 DIAGNOSIS — Z5112 Encounter for antineoplastic immunotherapy: Secondary | ICD-10-CM | POA: Insufficient documentation

## 2019-08-09 DIAGNOSIS — Z9181 History of falling: Secondary | ICD-10-CM | POA: Diagnosis not present

## 2019-08-09 DIAGNOSIS — K219 Gastro-esophageal reflux disease without esophagitis: Secondary | ICD-10-CM | POA: Insufficient documentation

## 2019-08-09 DIAGNOSIS — C61 Malignant neoplasm of prostate: Secondary | ICD-10-CM | POA: Insufficient documentation

## 2019-08-09 DIAGNOSIS — R000 Tachycardia, unspecified: Secondary | ICD-10-CM | POA: Diagnosis not present

## 2019-08-09 DIAGNOSIS — R5383 Other fatigue: Secondary | ICD-10-CM | POA: Insufficient documentation

## 2019-08-09 DIAGNOSIS — Z5111 Encounter for antineoplastic chemotherapy: Secondary | ICD-10-CM

## 2019-08-09 DIAGNOSIS — I10 Essential (primary) hypertension: Secondary | ICD-10-CM | POA: Insufficient documentation

## 2019-08-09 DIAGNOSIS — L03311 Cellulitis of abdominal wall: Secondary | ICD-10-CM | POA: Diagnosis not present

## 2019-08-09 DIAGNOSIS — K56609 Unspecified intestinal obstruction, unspecified as to partial versus complete obstruction: Secondary | ICD-10-CM | POA: Diagnosis not present

## 2019-08-09 DIAGNOSIS — R109 Unspecified abdominal pain: Secondary | ICD-10-CM | POA: Diagnosis not present

## 2019-08-09 DIAGNOSIS — R5381 Other malaise: Secondary | ICD-10-CM | POA: Insufficient documentation

## 2019-08-09 DIAGNOSIS — Z23 Encounter for immunization: Secondary | ICD-10-CM | POA: Insufficient documentation

## 2019-08-09 DIAGNOSIS — Z79899 Other long term (current) drug therapy: Secondary | ICD-10-CM | POA: Insufficient documentation

## 2019-08-09 DIAGNOSIS — R296 Repeated falls: Secondary | ICD-10-CM | POA: Diagnosis not present

## 2019-08-09 DIAGNOSIS — C679 Malignant neoplasm of bladder, unspecified: Secondary | ICD-10-CM

## 2019-08-09 LAB — CBC WITH DIFFERENTIAL/PLATELET
Abs Immature Granulocytes: 0.21 10*3/uL — ABNORMAL HIGH (ref 0.00–0.07)
Basophils Absolute: 0.1 10*3/uL (ref 0.0–0.1)
Basophils Relative: 1 %
Eosinophils Absolute: 0.2 10*3/uL (ref 0.0–0.5)
Eosinophils Relative: 1 %
HCT: 33.5 % — ABNORMAL LOW (ref 39.0–52.0)
Hemoglobin: 10.7 g/dL — ABNORMAL LOW (ref 13.0–17.0)
Immature Granulocytes: 1 %
Lymphocytes Relative: 11 %
Lymphs Abs: 2.5 10*3/uL (ref 0.7–4.0)
MCH: 28.2 pg (ref 26.0–34.0)
MCHC: 31.9 g/dL (ref 30.0–36.0)
MCV: 88.4 fL (ref 80.0–100.0)
Monocytes Absolute: 1.2 10*3/uL — ABNORMAL HIGH (ref 0.1–1.0)
Monocytes Relative: 5 %
Neutro Abs: 18.6 10*3/uL — ABNORMAL HIGH (ref 1.7–7.7)
Neutrophils Relative %: 81 %
Platelets: 404 10*3/uL — ABNORMAL HIGH (ref 150–400)
RBC: 3.79 MIL/uL — ABNORMAL LOW (ref 4.22–5.81)
RDW: 16.1 % — ABNORMAL HIGH (ref 11.5–15.5)
WBC: 22.9 10*3/uL — ABNORMAL HIGH (ref 4.0–10.5)
nRBC: 0 % (ref 0.0–0.2)

## 2019-08-09 LAB — COMPREHENSIVE METABOLIC PANEL
ALT: 11 U/L (ref 0–44)
AST: 13 U/L — ABNORMAL LOW (ref 15–41)
Albumin: 2.7 g/dL — ABNORMAL LOW (ref 3.5–5.0)
Alkaline Phosphatase: 70 U/L (ref 38–126)
Anion gap: 8 (ref 5–15)
BUN: 16 mg/dL (ref 8–23)
CO2: 24 mmol/L (ref 22–32)
Calcium: 8.8 mg/dL — ABNORMAL LOW (ref 8.9–10.3)
Chloride: 102 mmol/L (ref 98–111)
Creatinine, Ser: 0.95 mg/dL (ref 0.61–1.24)
GFR calc Af Amer: >60 mL/min (ref >60)
GFR calc non Af Amer: >60 mL/min (ref >60)
Glucose, Bld: 115 mg/dL — ABNORMAL HIGH (ref 70–99)
Potassium: 3.6 mmol/L (ref 3.5–5.1)
Sodium: 134 mmol/L — ABNORMAL LOW (ref 135–145)
Total Bilirubin: 0.3 mg/dL (ref 0.3–1.2)
Total Protein: 6.9 g/dL (ref 6.5–8.1)

## 2019-08-09 MED ORDER — HEPARIN SOD (PORK) LOCK FLUSH 100 UNIT/ML IV SOLN
500.0000 [IU] | Freq: Once | INTRAVENOUS | Status: AC
  Administered 2019-08-09: 500 [IU] via INTRAVENOUS
  Filled 2019-08-09: qty 5

## 2019-08-09 MED ORDER — SODIUM CHLORIDE 0.9 % IV SOLN
Freq: Once | INTRAVENOUS | Status: AC
  Administered 2019-08-09: 10:00:00 via INTRAVENOUS
  Filled 2019-08-09: qty 250

## 2019-08-09 MED ORDER — SODIUM CHLORIDE 0.9% FLUSH
10.0000 mL | INTRAVENOUS | Status: DC | PRN
  Administered 2019-08-09: 09:00:00 10 mL via INTRAVENOUS
  Filled 2019-08-09: qty 10

## 2019-08-09 MED ORDER — PALONOSETRON HCL INJECTION 0.25 MG/5ML
0.2500 mg | Freq: Once | INTRAVENOUS | Status: AC
  Administered 2019-08-09: 11:00:00 0.25 mg via INTRAVENOUS
  Filled 2019-08-09: qty 5

## 2019-08-09 MED ORDER — DEXAMETHASONE SODIUM PHOSPHATE 10 MG/ML IJ SOLN
10.0000 mg | Freq: Once | INTRAMUSCULAR | Status: AC
  Administered 2019-08-09: 11:00:00 10 mg via INTRAVENOUS
  Filled 2019-08-09: qty 1

## 2019-08-09 MED ORDER — SODIUM CHLORIDE 0.9 % IV SOLN: 10.0000 mg | Freq: Once | INTRAVENOUS | Status: DC

## 2019-08-09 MED ORDER — SODIUM CHLORIDE 0.9 % IV SOLN
60.0000 mg | Freq: Once | INTRAVENOUS | Status: AC
  Administered 2019-08-09: 60 mg via INTRAVENOUS
  Filled 2019-08-09: qty 6

## 2019-08-09 NOTE — Progress Notes (Signed)
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 For his prostate cancer he has received IM RT in the past. He has been seeing Dr. Erlene Quan in the past for his recurrent UTIs as well as hematuria. CT scan in July 2018 showed bladder wall thickening with perivascular edema and inflammation in the right ureter greater than the left. Findings were thought to be due to pyelonephritis at that time. He underwent cystoscopy on 12/14/2017 which showed abnormal looking prostate with necrotic material lining the entire surface area. Diffuse copious debris within the bladder appearing to be erythematous without discrete bladder tumor but visualization was poor. He was then admitted to the hospital on 12/26/2017 with symptoms of UTI and sepsis as well as new moderate bilateral hydronephrosis.  CT abdomen and pelvis with contrast on 12/20/2017 again showed market bile bladder wall thickening with perivesicular edema. Within the lumen of the bladder there is a 93.9 cm soft tissue attenuating filling defect. This is indeterminate and could represent an area of blood clot versus urothelial lesion.  He underwent repeat cystoscopy with bilateral pyelogram and ureteral stent placement as well as TURBT and TURP. He was found to have a massive tumor involving the  majority of the bladder with grossly necrotic and calcified material. There appeared to be multifocal disease with large burden of the left anterior bladder wall extending posteriorly as well as adjacent to the bladder neck and beyond the right hemitrigone. Very little normal recognizable bladder mucosa remaining.   Biopsy from TURBT and TURP showed: High-grade urothelial carcinoma with extensive necrosisinvolving both the bladder and the prostate.CT chest did not reveal any evidence of metastatic disease. He was also not found to have any regional adenopathy on CT abdomen  Dr. Erlene Quan performed interval TURBT on 01/15/18 and was able to debulk tumor as much as possible to reduce tumor burden  Patient received 5 cycles ofcarboplatin/ gemcitabine 2 weeks on and 1 week off ending 04/26/18.Patient did receive radiation for 10 days during cycle 2 of treatment. Given patients age, co-morbidities and frailty- he was not a cisplatin candidate.6th cycle not given due to fall and hip fracture  Patient had a repeat cystoscopy in September 2019 which showed no obvious tumor bladder but it did have a shaggy necrotic appearance at the bladder neck. Prostatic fossa was grossly abnormal necrotic and irregular without papillary change. Bladder neck was biopsied and showed residual high-grade urothelial carcinoma. Muscularis propria was not seen in that specimen.Due to evidence of recurrent/residual disease patient was started on Tecentriq on 07/26/2018  Patient noted to have local progression of his bladder cancer causing bowel obstruction in July 2020.  He is status post diverting colostomy.  He also has bilateral nephrostomies in place.   Interval history-met with patient and his wife today.  Patient is extremely hard of hearing and his wife has been his primary caregiver.  She reports that patient is doing well overall and feels  back at his baseline.  He is ambulating around the house with the  help of a walker.  Denies any abdominal pain.  Colostomy and nephrostomy tubes have been working well.  ECOG PS- 2 Pain scale- 0   Review of systems- Review of Systems  Constitutional: Positive for malaise/fatigue. Negative for chills, fever and weight loss.  HENT: Negative for congestion, ear discharge and nosebleeds.   Eyes: Negative for blurred vision.  Respiratory: Negative for cough, hemoptysis, sputum production, shortness of breath and wheezing.   Cardiovascular: Negative for chest pain, palpitations, orthopnea and claudication.  Gastrointestinal: Negative for abdominal pain, blood in stool, constipation, diarrhea, heartburn, melena, nausea and vomiting.  Genitourinary: Negative for dysuria, flank pain, frequency, hematuria and urgency.  Musculoskeletal: Negative for back pain, joint pain and myalgias.  Skin: Negative for rash.  Neurological: Negative for dizziness, tingling, focal weakness, seizures, weakness and headaches.  Endo/Heme/Allergies: Does not bruise/bleed easily.  Psychiatric/Behavioral: Negative for depression and suicidal ideas. The patient does not have insomnia.       No Known Allergies   Past Medical History:  Diagnosis Date   Anxiety    Arthritis    Bladder cancer ([place])    Dysrhythmia 07/2018   history of atrial flutter that worsens with anxiety   Femur fracture, right ([place]) 05/01/2018   GERD (gastroesophageal reflux disease)    History of recent blood transfusion 05/2018   Hypertension    Iron deficiency anemia 09/14/2018   Prostate cancer ([place]) 07/2018   cancer growing in prostate but not prostate cancer, it is from the bladder   Umbilical hernia 57/2620   Urinary retention 2019   foley catheter place 11/2017   UTI (urinary tract infection) 2019   frequent UTI's over last year   Wound eschar of foot 07/2018   left heal getting wrapped and requiring antibiotic cream. cracks open with weight bearing.     Past Surgical History:    Procedure Laterality Date   CARPAL TUNNEL RELEASE Right    CHOLECYSTECTOMY  2004   COLOSTOMY N/A 06/08/2019   Procedure: COLOSTOMY;  Surgeon: Herbert Pun, MD;  Location: [facility];  Service: General;  Laterality: N/A;   CYSTOGRAM  07/18/2018   Procedure: CYSTOGRAM;  Surgeon: [name], MD;  Location: [facility];  Service: Urology;;   Consuela Mimes W/ RETROGRADES Bilateral 07/18/2018   Procedure: CYSTOSCOPY WITH RETROGRADE PYELOGRAM;  Surgeon: [name], MD;  Location: [facility];  Service: Urology;  Laterality: Bilateral;   CYSTOSCOPY W/ URETERAL STENT PLACEMENT Bilateral 12/27/2017   Procedure: CYSTOSCOPY WITH RETROGRADE PYELOGRAM/URETERAL STENT PLACEMENT;  Surgeon: [name], MD;  Location: [facility];  Service: Urology;  Laterality: Bilateral;   CYSTOSCOPY W/ URETERAL STENT PLACEMENT Bilateral 07/18/2018   Procedure: CYSTOSCOPY WITH STENT REPLACEMENT (exchange);  Surgeon: [name], MD;  Location: [facility];  Service: Urology;  Laterality: Bilateral;   CYSTOSCOPY WITH STENT PLACEMENT Bilateral 11/12/2018   Procedure: [place] WITH STENT Exchange;  Surgeon: [name], MD;  Location: [facility];  Service: Urology;  Laterality: Bilateral;   INTRAMEDULLARY (IM) NAIL INTERTROCHANTERIC Right 05/02/2018   Procedure: INTRAMEDULLARY (IM) NAIL INTERTROCHANTRIC;  Surgeon: [name], MD;  Location: [facility];  Service: Orthopedics;  Laterality: Right;   IR NEPHROSTOMY EXCHANGE LEFT  03/25/2019   IR NEPHROSTOMY EXCHANGE LEFT  04/19/2019   IR NEPHROSTOMY EXCHANGE LEFT  05/31/2019   IR NEPHROSTOMY EXCHANGE LEFT  07/19/2019   IR NEPHROSTOMY EXCHANGE RIGHT  03/25/2019   IR NEPHROSTOMY EXCHANGE RIGHT  04/19/2019   IR NEPHROSTOMY  EXCHANGE RIGHT  05/31/2019   IR NEPHROSTOMY EXCHANGE RIGHT  07/19/2019   IR NEPHROSTOMY PLACEMENT LEFT  02/23/2019   IR NEPHROSTOMY PLACEMENT RIGHT  02/23/2019   LAPAROTOMY N/A 06/08/2019   Procedure: EXPLORATORY LAPAROTOMY;  Surgeon:  Herbert Pun, MD;  Location: [facility];  Service: General;  Laterality: N/A;   LEG TENDON SURGERY Right 1958   PORTA CATH INSERTION N/A 01/22/2018   Procedure: PORTA CATH INSERTION;  Surgeon: [name], MD;  Location: [facility];  Service: Cardiovascular;  Laterality: N/A;   TRANSURETHRAL RESECTION OF BLADDER TUMOR N/A 12/27/2017   Procedure: TRANSURETHRAL RESECTION OF BLADDER TUMOR (TURBT);  Surgeon: [name], MD;  Location: [facility];  Service: Urology;  Laterality: N/A;   TRANSURETHRAL RESECTION OF BLADDER TUMOR N/A 01/15/2018   Procedure: TRANSURETHRAL RESECTION OF BLADDER TUMOR (TURBT);  Surgeon: [name], MD;  Location: [facility];  Service: Urology;  Laterality: N/A;  Need 2 hrs for this case please   TRANSURETHRAL RESECTION OF BLADDER TUMOR N/A 07/18/2018   Procedure: TRANSURETHRAL RESECTION OF BLADDER TUMOR (TURBT);  Surgeon: [name], MD;  Location: [facility];  Service: Urology;  Laterality: N/A;    Social History   Socioeconomic History   Marital status: Married    Spouse name: ruth   Number of children: Not on file   Years of education: Not on file   Highest education level: Not on file  Occupational History   Occupation: retired    Comment: Barista strain: Patient refused   Food insecurity    Worry: Patient refused    Inability: Patient refused   Transportation needs    Medical: Patient refused    Non-medical: Patient refused  Tobacco Use   Smoking status: Never Smoker   Smokeless tobacco: Never Used  Substance and Sexual Activity   Alcohol use: No    Alcohol/week: 0.0 standard drinks   Drug use: No   Sexual activity: Not Currently  Lifestyle   Physical activity    Days per week: Patient refused    Minutes per session: Patient refused   Stress: Patient refused  Relationships   Social connections    Talks on phone: Patient refused    Gets together: Patient  refused    Attends religious service: Patient refused    Active member of club or organization: Patient refused    Attends meetings of clubs or organizations: Patient refused    Relationship status: Patient refused   Intimate partner violence    Fear of current or ex partner: Patient refused    Emotionally abused: Patient refused    Physically abused: Patient refused    Forced sexual activity: Patient refused  Other Topics Concern   Not on file  Social History Narrative   Not on file    Family History  Problem Relation Age of Onset   Cancer Mother    Chronic Renal Failure Mother    Heart disease Father      Current Outpatient Medications:    acetaminophen (TYLENOL) 500 MG tablet, Take 1,000 mg by mouth every 6 (six) hours as needed for moderate pain or headache. , Disp: , Rfl:    cetirizine (ZYRTEC) 10 MG tablet, Take 10 mg by mouth daily as needed for allergies. , Disp: , Rfl:    Cranberry-Cholecalciferol (SUPER CRANBERRY/VITAMIN D3) 4200-500 MG-UNIT CAPS, Take 1 capsule by mouth 2 (two) times daily., Disp: , Rfl:    feeding supplement, ENSURE ENLIVE, (ENSURE  ENLIVE) LIQD, Take 237 mLs by mouth 2 (two) times daily between meals., Disp: 237 mL, Rfl: 12   ferrous sulfate 325 (65 FE) MG tablet, Take 1 tablet (325 mg total) by mouth 2 (two) times daily with a meal., Disp: , Rfl: 3   lidocaine-prilocaine (EMLA) cream, Apply 1 application topically as needed., Disp: 30 g, Rfl: 1   Multiple Vitamin (MULTIVITAMIN WITH MINERALS) TABS tablet, Take 1 tablet by mouth daily., Disp: , Rfl:    MYRBETRIQ 50 MG TB24 tablet, TAKE 1 TABLET BY MOUTH DAILY, Disp: 30 tablet, Rfl: 3   nystatin (NYAMYC) powder, Apply topically 2 (two) times daily. (Patient taking differently: Apply 1 g topically 2 (two) times daily as needed. ), Disp: 60 g, Rfl: 3   oxyCODONE (OXY IR/ROXICODONE) 5 MG immediate release tablet, Take 1 tablet (5 mg total) by mouth every 4 (four) hours as needed for severe  pain., Disp: 60 tablet, Rfl: 0   polyethylene glycol powder (MIRALAX) powder, Take 17 g by mouth daily as needed. Can increase to 3 times a day as needed for constipation but hold medication if has diarrhea, Disp: 255 g, Rfl: 0   vitamin B-12 (CYANOCOBALAMIN) 500 MCG tablet, Take 500 mcg by mouth daily., Disp: , Rfl:    zinc oxide 20 % ointment, Apply 1 application topically as needed for irritation., Disp: , Rfl:    dexamethasone (DECADRON) 4 MG tablet, Take 2 tablets (8 mg total) by mouth daily. Start the day after chemotherapy for 2 days. (Patient not taking: Reported on 08/08/2019), Disp: 30 tablet, Rfl: 1   lidocaine-prilocaine (EMLA) cream, Apply to affected area once, Disp: 30 g, Rfl: 3   ondansetron (ZOFRAN) 8 MG tablet, Take 1 tablet (8 mg total) by mouth 2 (two) times daily as needed for refractory nausea / vomiting. Start on day 3 after chemo. (Patient not taking: Reported on 08/08/2019), Disp: 30 tablet, Rfl: 1   prochlorperazine (COMPAZINE) 10 MG tablet, Take 1 tablet (10 mg total) by mouth every 6 (six) hours as needed (Nausea or vomiting). (Patient not taking: Reported on 08/08/2019), Disp: 30 tablet, Rfl: 1 No current facility-administered medications for this visit.   Facility-Administered Medications Ordered in Other Visits:    heparin lock flush 100 unit/mL, 500 Units, Intravenous, Once, Sindy Guadeloupe, MD   sodium chloride flush (NS) 0.9 % injection 10 mL, 10 mL, Intravenous, PRN, Sindy Guadeloupe, MD, 10 mL at 08/09/19 0837  Physical exam:  Vitals:   08/09/19 0925  BP: 113/69  Pulse: (!) 113  Resp: 16  Temp: (!) 95.6 F (35.3 C)  TempSrc: Tympanic  Weight: 139 lb (63 kg)  Height: '5\' 11"'$  (1.803 m)   Physical Exam Constitutional:      Comments: Frail elderly gentleman sitting in a wheelchair.  Appears in no acute distress  HENT:     Head: Normocephalic and atraumatic.  Eyes:     Pupils: Pupils are equal, round, and reactive to light.  Neck:      Musculoskeletal: Normal range of motion.  Cardiovascular:     Rate and Rhythm: Normal rate and regular rhythm.     Heart sounds: Normal heart sounds.  Pulmonary:     Effort: Pulmonary effort is normal.     Breath sounds: Normal breath sounds.  Abdominal:     General: Bowel sounds are normal.     Palpations: Abdomen is soft.     Comments: Midline colostomy in place as well as bilateral nephrostomy tubes in place  Skin:    General: Skin is warm and dry.  Neurological:     Mental Status: He is alert and oriented to person, place, and time.      CMP Latest Ref Rng & Units 08/09/2019  Glucose 70 - 99 mg/dL 115(H)  BUN 8 - 23 mg/dL 16  Creatinine 0.61 - 1.24 mg/dL 0.95  Sodium 135 - 145 mmol/L 134(L)  Potassium 3.5 - 5.1 mmol/L 3.6  Chloride 98 - 111 mmol/L 102  CO2 22 - 32 mmol/L 24  Calcium 8.9 - 10.3 mg/dL 8.8(L)  Total Protein 6.5 - 8.1 g/dL 6.9  Total Bilirubin 0.3 - 1.2 mg/dL 0.3  Alkaline Phos 38 - 126 U/L 70  AST 15 - 41 U/L 13(L)  ALT 0 - 44 U/L 11   CBC Latest Ref Rng & Units 08/09/2019  WBC 4.0 - 10.5 K/uL 22.9(H)  Hemoglobin 13.0 - 17.0 g/dL 10.7(L)  Hematocrit 39.0 - 52.0 % 33.5(L)  Platelets 150 - 400 K/uL 404(H)    No images are attached to the encounter.  Ct Chest W Contrast  Result Date: 08/08/2019 CLINICAL DATA:  Restaging metastatic bladder cancer. History of prostate cancer. EXAM: CT CHEST, ABDOMEN, AND PELVIS WITH CONTRAST TECHNIQUE: Multidetector CT imaging of the chest, abdomen and pelvis was performed following the standard protocol during bolus administration of intravenous contrast. CONTRAST:  114m OMNIPAQUE IOHEXOL 300 MG/ML  SOLN COMPARISON:  Multiple exams, including 06/06/2019 FINDINGS: CT CHEST FINDINGS Cardiovascular: Coronary, aortic arch, and branch vessel atherosclerotic vascular disease. Ectatic ascending thoracic aorta at 3.8 cm. Mediastinum/Nodes: Contrast medium in the distal esophagus possibly from dysmotility or reflux. Lungs/Pleura: Small  bilateral pleural effusions. 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 Stable scarring posteriorly in the right upper lobe. Musculoskeletal: Considerable degenerative glenohumeral arthropathy bilaterally. Thoracic spondylosis. Bilateral gynecomastia. CT ABDOMEN PELVIS FINDINGS Hepatobiliary: Cholecystectomy.  Otherwise unremarkable. Pancreas: Unremarkable Spleen: Upper normal splenic size. Adrenals/Urinary Tract: Stable fullness of the left adrenal gland. Bilateral percutaneous nephrostomies. Stable right mid kidney cyst. Stable mild renal atrophy. Urinary bladder appears grossly abnormal, with irregular wall thickening, irregular folds, internal gas density, complex septation anteriorly, internal air-fluid levels, and expansion of an anterior diverticular like structure herniating out in between the thickened rectus abdominus muscles. Surrounding indistinctness of fat planes potentially from inflammation or tumor. Poor definition of the interface between the prostate gland and the urinary bladder. Stomach/Bowel: Small duodenal diverticula. Prominence of stool in the rectum. Transverse colostomy in the upper abdomen, new compared to the prior exam. Proximal sigmoid colon wall thickening adjacent to the urinary bladder. Vascular/Lymphatic: Aortoiliac atherosclerotic vascular disease. No individually definable pathologically enlarged lymph nodes. Reproductive: Suspected prior transurethral resection of the prostate with a large defect in the upper prostate near the border with the urinary bladder. Enhancing margins along the urinary bladder margin. Other: No supplemental non-categorized findings. Musculoskeletal: Right hip screw. Bony demineralization. Severe degenerative left hip arthropathy. Deformity in the right proximal femur from prior fracture. Lumbar spondylosis. IMPRESSION: 1. Continued appearance of markedly irregular urinary bladder with irregular wall thickening, internal  folds, internal air-fluid levels, and severe surrounding inflammatory findings effacing surrounding fat planes. There was previously an anterior diverticular type outpouching through the presumed tumor or inflammatory thickening in the anterior bladder, this is expanded and is now gas-filled and extends as a hernia in between the increasingly thickened inferior rectus musculature in into the subcutaneous space as on image 108/2. This is in contiguity with the urinary bladder, and could represent an unusual diverticulum  of the bladder, or simply breakdown of local tissues. Infection is certainly not excluded. Residual malignancy likewise is not excluded given this complex appearance. 2. There is thickening of the proximal sigmoid colon in the left lower quadrant adjacent to the urinary bladder which could be from secondary inflammation, or possibly treatment related effects if the patient has had prior radiation therapy in this region. 3. Other imaging findings of potential clinical significance: Aortic Atherosclerosis (ICD10-I70.0). Coronary atherosclerosis. Esophageal dysmotility versus reflux. Small bilateral pleural effusions. Remote asbestos exposure. Severe glenohumeral and hip arthropathy bilaterally. Bilateral gynecomastia. Bilateral percutaneous nephrostomies without hydronephrosis. New transverse colostomy in the upper abdomen without complicating feature. Prior TURP. Bony demineralization. Electronically Signed   By: Van Clines M.D.   On: 08/08/2019 10:25   Ct Abdomen Pelvis W Contrast  Result Date: 08/08/2019 CLINICAL DATA:  Restaging metastatic bladder cancer. History of prostate cancer. EXAM: CT CHEST, ABDOMEN, AND PELVIS WITH CONTRAST TECHNIQUE: Multidetector CT imaging of the chest, abdomen and pelvis was performed following the standard protocol during bolus administration of intravenous contrast. CONTRAST:  135m OMNIPAQUE IOHEXOL 300 MG/ML  SOLN COMPARISON:  Multiple exams, including  06/06/2019 FINDINGS: CT CHEST FINDINGS Cardiovascular: Coronary, aortic arch, and branch vessel atherosclerotic vascular disease. Ectatic ascending thoracic aorta at 3.8 cm. Mediastinum/Nodes: Contrast medium in the distal esophagus possibly from dysmotility or reflux. Lungs/Pleura: Small bilateral pleural effusions. 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 Stable scarring posteriorly in the right upper lobe. Musculoskeletal: Considerable degenerative glenohumeral arthropathy bilaterally. Thoracic spondylosis. Bilateral gynecomastia. CT ABDOMEN PELVIS FINDINGS Hepatobiliary: Cholecystectomy.  Otherwise unremarkable. Pancreas: Unremarkable Spleen: Upper normal splenic size. Adrenals/Urinary Tract: Stable fullness of the left adrenal gland. Bilateral percutaneous nephrostomies. Stable right mid kidney cyst. Stable mild renal atrophy. Urinary bladder appears grossly abnormal, with irregular wall thickening, irregular folds, internal gas density, complex septation anteriorly, internal air-fluid levels, and expansion of an anterior diverticular like structure herniating out in between the thickened rectus abdominus muscles. Surrounding indistinctness of fat planes potentially from inflammation or tumor. Poor definition of the interface between the prostate gland and the urinary bladder. Stomach/Bowel: Small duodenal diverticula. Prominence of stool in the rectum. Transverse colostomy in the upper abdomen, new compared to the prior exam. Proximal sigmoid colon wall thickening adjacent to the urinary bladder. Vascular/Lymphatic: Aortoiliac atherosclerotic vascular disease. No individually definable pathologically enlarged lymph nodes. Reproductive: Suspected prior transurethral resection of the prostate with a large defect in the upper prostate near the border with the urinary bladder. Enhancing margins along the urinary bladder margin. Other: No supplemental non-categorized findings.  Musculoskeletal: Right hip screw. Bony demineralization. Severe degenerative left hip arthropathy. Deformity in the right proximal femur from prior fracture. Lumbar spondylosis. IMPRESSION: 1. Continued appearance of markedly irregular urinary bladder with irregular wall thickening, internal folds, internal air-fluid levels, and severe surrounding inflammatory findings effacing surrounding fat planes. There was previously an anterior diverticular type outpouching through the presumed tumor or inflammatory thickening in the anterior bladder, this is expanded and is now gas-filled and extends as a hernia in between the increasingly thickened inferior rectus musculature in into the subcutaneous space as on image 108/2. This is in contiguity with the urinary bladder, and could represent an unusual diverticulum of the bladder, or simply breakdown of local tissues. Infection is certainly not excluded. Residual malignancy likewise is not excluded given this complex appearance. 2. There is thickening of the proximal sigmoid colon in the left lower quadrant adjacent to the urinary bladder which could be from secondary inflammation,  or possibly treatment related effects if the patient has had prior radiation therapy in this region. 3. Other imaging findings of potential clinical significance: Aortic Atherosclerosis (ICD10-I70.0). Coronary atherosclerosis. Esophageal dysmotility versus reflux. Small bilateral pleural effusions. Remote asbestos exposure. Severe glenohumeral and hip arthropathy bilaterally. Bilateral gynecomastia. Bilateral percutaneous nephrostomies without hydronephrosis. New transverse colostomy in the upper abdomen without complicating feature. Prior TURP. Bony demineralization. Electronically Signed   By: Van Clines M.D.   On: 08/08/2019 10:25   Ir Nephrostomy Exchange Left  Result Date: 07/19/2019 INDICATION: History of bladder cancer with chronic bilateral percutaneous nephrostomy catheters.  Please perform fluoroscopic guided exchange. Note, since the time of the patient's previous exchange, he has undergone a diverting colostomy secondary to direct colonic erosion of his bladder cancer and subsequent obstruction. EXAM: FLUOROSCOPIC GUIDED BILATERAL SIDED NEPHROSTOMY CATHETER EXCHANGE COMPARISON:  Multiple previous fluoroscopic guided nephrostomy catheter exchanges, most recently on 05/31/2019; CT abdomen pelvis-06/06/2019 CONTRAST:  A total of 15 mL Isovue-300 administered was administered into both collecting systems FLUOROSCOPY TIME:  36 seconds (13 mGy) COMPLICATIONS: None immediate. TECHNIQUE: Informed written consent was obtained from the patient after a discussion of the risks, benefits and alternatives to treatment. Questions regarding the procedure were encouraged and answered. A timeout was performed prior to the initiation of the procedure. The bilateral flanks and external portions of existing nephrostomy catheters were prepped and draped in the usual sterile fashion. A sterile drape was applied covering the operative field. Maximum barrier sterile technique with sterile gowns and gloves were used for the procedure. A timeout was performed prior to the initiation of the procedure. A pre procedural spot fluoroscopic image was obtained. Beginning with the left-sided nephrostomy, a small amount of contrast was injected via the existing left-sided nephrostomy catheter demonstrating appropriate positioning within the renal pelvis. The existing nephrostomy catheter was cut and cannulated with a Benson wire which was coiled within the renal pelvis. Under intermittent fluoroscopic guidance, the existing nephrostomy catheter was exchanged for a new 12 [place] all-purpose drainage catheter. Limited contrast injection confirmed appropriate positioning within the left renal pelvis and a post exchange fluoroscopic image was obtained. The catheter was locked, secured to the skin with an interrupted suture  and reconnected to a gravity bag. The identical repeat procedure was repeated for the contralateral right-sided nephrostomy, ultimately allowing successful exchange of a new 12 [place] all-purpose drainage catheter with end coiled and locked within the right renal pelvis. Dressings were placed. The patient tolerated the above procedures well without immediate postprocedural complication. FINDINGS: The existing nephrostomy catheters are appropriately positioned and functioning. After successful fluoroscopic guided exchange, new bilateral 12 French nephrostomy catheters are coiled and locked within the respective renal pelvises. IMPRESSION: Successful fluoroscopic guided exchange of bilateral 12 French percutaneous nephrostomy catheters. Electronically Signed   By: Sandi Mariscal M.D.   On: 07/19/2019 12:33   Ir Nephrostomy Exchange Right  Result Date: 07/19/2019 INDICATION: History of bladder cancer with chronic bilateral percutaneous nephrostomy catheters. Please perform fluoroscopic guided exchange. Note, since the time of the patient's previous exchange, he has undergone a diverting colostomy secondary to direct colonic erosion of his bladder cancer and subsequent obstruction. EXAM: FLUOROSCOPIC GUIDED BILATERAL SIDED NEPHROSTOMY CATHETER EXCHANGE COMPARISON:  Multiple previous fluoroscopic guided nephrostomy catheter exchanges, most recently on 05/31/2019; CT abdomen pelvis-06/06/2019 CONTRAST:  A total of 15 mL Isovue-300 administered was administered into both collecting systems FLUOROSCOPY TIME:  36 seconds (13 mGy) COMPLICATIONS: None immediate. TECHNIQUE: Informed written consent was obtained from the patient after a  discussion of the risks, benefits and alternatives to treatment. Questions regarding the procedure were encouraged and answered. A timeout was performed prior to the initiation of the procedure. The bilateral flanks and external portions of existing nephrostomy catheters were prepped and draped  in the usual sterile fashion. A sterile drape was applied covering the operative field. Maximum barrier sterile technique with sterile gowns and gloves were used for the procedure. A timeout was performed prior to the initiation of the procedure. A pre procedural spot fluoroscopic image was obtained. Beginning with the left-sided nephrostomy, a small amount of contrast was injected via the existing left-sided nephrostomy catheter demonstrating appropriate positioning within the renal pelvis. The existing nephrostomy catheter was cut and cannulated with a Benson wire which was coiled within the renal pelvis. Under intermittent fluoroscopic guidance, the existing nephrostomy catheter was exchanged for a new 12 [place] all-purpose drainage catheter. Limited contrast injection confirmed appropriate positioning within the left renal pelvis and a post exchange fluoroscopic image was obtained. The catheter was locked, secured to the skin with an interrupted suture and reconnected to a gravity bag. The identical repeat procedure was repeated for the contralateral right-sided nephrostomy, ultimately allowing successful exchange of a new 12 [place] all-purpose drainage catheter with end coiled and locked within the right renal pelvis. Dressings were placed. The patient tolerated the above procedures well without immediate postprocedural complication. FINDINGS: The existing nephrostomy catheters are appropriately positioned and functioning. After successful fluoroscopic guided exchange, new bilateral 12 French nephrostomy catheters are coiled and locked within the respective renal pelvises. IMPRESSION: Successful fluoroscopic guided exchange of bilateral 12 French percutaneous nephrostomy catheters. Electronically Signed   By: Sandi Mariscal M.D.   On: 07/19/2019 12:33     Assessment and plan- Patient is a 84 y.o. male with locally advanced muscle invasive urothelial carcinoma now involving bowel causing bowel obstruction  status post diverting colostomy.   He is here for on treatment assessment prior to cycle 1 day 1 of padcev  Ever since patient started taking chemotherapy 1.5 years ago he has been frail but was able to get through treatment.  When he was admitted to the hospital with bowel obstruction he has become really weak and hospice was being considered.  Patient then went to a rehab and seems to have gotten back to his baseline.  Patient and family is not interested in hospice at this time.  We talked about trying a modified regimen of Padcev at 1 mg per metered squared 2 weeks on and one-week off instead of 3 weeks on 1 week off.  We will proceed with cycle 1 day 1 today.  I will see him back in 1 week's time with CBC with differential, CMP for cycle 1 day 8.  We have also sent out a FGFR testing which is currently pending.  Patient and family understand the possible side effects of Padcev including all but not limited to nausea, vomiting, fatigue, low blood counts and risk of peripheral neuropathy.  Especially discussed peripheral neuropathy which can affect his mobility around the house.  We will proceed with treatment cautiously and if he develops any untoward side effects we will have a low threshold to stopping treatment and considering hospice   Total face to face encounter time for this patient visit was 30 min. >50% of the time was  spent in counseling and coordination of care.     Visit Diagnosis 1. Encounter for antineoplastic chemotherapy   2. Urothelial cancer ([place])  Dr. Randa Evens, MD, MPH [facility] [phone] 08/09/2019 10:21 AM

## 2019-08-15 ENCOUNTER — Encounter: Payer: Self-pay | Admitting: Oncology

## 2019-08-16 ENCOUNTER — Inpatient Hospital Stay (HOSPITAL_BASED_OUTPATIENT_CLINIC_OR_DEPARTMENT_OTHER): Payer: Medicare Other | Admitting: Oncology

## 2019-08-16 ENCOUNTER — Other Ambulatory Visit: Payer: Self-pay

## 2019-08-16 ENCOUNTER — Inpatient Hospital Stay: Payer: Medicare Other

## 2019-08-16 ENCOUNTER — Encounter: Payer: Self-pay | Admitting: Oncology

## 2019-08-16 VITALS — BP 116/62 | HR 110 | Temp 96.8°F | Ht 71.0 in | Wt 149.0 lb

## 2019-08-16 DIAGNOSIS — D638 Anemia in other chronic diseases classified elsewhere: Secondary | ICD-10-CM | POA: Diagnosis not present

## 2019-08-16 DIAGNOSIS — Z95828 Presence of other vascular implants and grafts: Secondary | ICD-10-CM

## 2019-08-16 DIAGNOSIS — Z5111 Encounter for antineoplastic chemotherapy: Secondary | ICD-10-CM | POA: Diagnosis not present

## 2019-08-16 DIAGNOSIS — C689 Malignant neoplasm of urinary organ, unspecified: Secondary | ICD-10-CM

## 2019-08-16 DIAGNOSIS — C679 Malignant neoplasm of bladder, unspecified: Secondary | ICD-10-CM

## 2019-08-16 DIAGNOSIS — Z23 Encounter for immunization: Secondary | ICD-10-CM

## 2019-08-16 LAB — CBC WITH DIFFERENTIAL/PLATELET
Abs Immature Granulocytes: 0.43 10*3/uL — ABNORMAL HIGH (ref 0.00–0.07)
Basophils Absolute: 0.1 10*3/uL (ref 0.0–0.1)
Basophils Relative: 0 %
Eosinophils Absolute: 0.3 10*3/uL (ref 0.0–0.5)
Eosinophils Relative: 1 %
HCT: 35.2 % — ABNORMAL LOW (ref 39.0–52.0)
Hemoglobin: 11.1 g/dL — ABNORMAL LOW (ref 13.0–17.0)
Immature Granulocytes: 2 %
Lymphocytes Relative: 11 %
Lymphs Abs: 3.2 10*3/uL (ref 0.7–4.0)
MCH: 27.8 pg (ref 26.0–34.0)
MCHC: 31.5 g/dL (ref 30.0–36.0)
MCV: 88.2 fL (ref 80.0–100.0)
Monocytes Absolute: 1.5 10*3/uL — ABNORMAL HIGH (ref 0.1–1.0)
Monocytes Relative: 5 %
Neutro Abs: 22.6 10*3/uL — ABNORMAL HIGH (ref 1.7–7.7)
Neutrophils Relative %: 81 %
Platelets: 355 10*3/uL (ref 150–400)
RBC: 3.99 MIL/uL — ABNORMAL LOW (ref 4.22–5.81)
RDW: 17 % — ABNORMAL HIGH (ref 11.5–15.5)
WBC: 28.1 10*3/uL — ABNORMAL HIGH (ref 4.0–10.5)
nRBC: 0 % (ref 0.0–0.2)

## 2019-08-16 LAB — COMPREHENSIVE METABOLIC PANEL
ALT: 10 U/L (ref 0–44)
AST: 11 U/L — ABNORMAL LOW (ref 15–41)
Albumin: 2.9 g/dL — ABNORMAL LOW (ref 3.5–5.0)
Alkaline Phosphatase: 72 U/L (ref 38–126)
Anion gap: 9 (ref 5–15)
BUN: 20 mg/dL (ref 8–23)
CO2: 24 mmol/L (ref 22–32)
Calcium: 8.5 mg/dL — ABNORMAL LOW (ref 8.9–10.3)
Chloride: 100 mmol/L (ref 98–111)
Creatinine, Ser: 0.89 mg/dL (ref 0.61–1.24)
GFR calc Af Amer: >60 mL/min (ref >60)
GFR calc non Af Amer: >60 mL/min (ref >60)
Glucose, Bld: 136 mg/dL — ABNORMAL HIGH (ref 70–99)
Potassium: 3.8 mmol/L (ref 3.5–5.1)
Sodium: 133 mmol/L — ABNORMAL LOW (ref 135–145)
Total Bilirubin: 0.3 mg/dL (ref 0.3–1.2)
Total Protein: 6.6 g/dL (ref 6.5–8.1)

## 2019-08-16 MED ORDER — SODIUM CHLORIDE 0.9% FLUSH
10.0000 mL | Freq: Once | INTRAVENOUS | Status: AC
  Administered 2019-08-16: 10 mL via INTRAVENOUS
  Filled 2019-08-16: qty 10

## 2019-08-16 MED ORDER — HEPARIN SOD (PORK) LOCK FLUSH 100 UNIT/ML IV SOLN
500.0000 [IU] | Freq: Once | INTRAVENOUS | Status: AC | PRN
  Administered 2019-08-16: 500 [IU]
  Filled 2019-08-16: qty 5

## 2019-08-16 MED ORDER — SODIUM CHLORIDE 0.9 % IV SOLN
60.0000 mg | Freq: Once | INTRAVENOUS | Status: AC
  Administered 2019-08-16: 60 mg via INTRAVENOUS
  Filled 2019-08-16: qty 6

## 2019-08-16 MED ORDER — DEXAMETHASONE SODIUM PHOSPHATE 10 MG/ML IJ SOLN
10.0000 mg | Freq: Once | INTRAMUSCULAR | Status: AC
  Administered 2019-08-16: 10:00:00 10 mg via INTRAVENOUS
  Filled 2019-08-16: qty 1

## 2019-08-16 MED ORDER — SODIUM CHLORIDE 0.9 % IV SOLN
Freq: Once | INTRAVENOUS | Status: AC
  Administered 2019-08-16: 10:00:00 via INTRAVENOUS
  Filled 2019-08-16: qty 250

## 2019-08-16 MED ORDER — PALONOSETRON HCL INJECTION 0.25 MG/5ML
0.2500 mg | Freq: Once | INTRAVENOUS | Status: AC
  Administered 2019-08-16: 0.25 mg via INTRAVENOUS
  Filled 2019-08-16: qty 5

## 2019-08-16 MED ORDER — SODIUM CHLORIDE 0.9 % IV SOLN: 10.0000 mg | Freq: Once | INTRAVENOUS | Status: DC

## 2019-08-16 MED ORDER — INFLUENZA VAC A&B SA ADJ QUAD 0.5 ML IM PRSY
0.5000 mL | PREFILLED_SYRINGE | Freq: Once | INTRAMUSCULAR | Status: AC
  Administered 2019-08-16: 10:00:00 0.5 mL via INTRAMUSCULAR
  Filled 2019-08-16: qty 0.5

## 2019-08-16 NOTE — Addendum Note (Signed)
Addended by: Randa Evens C on: 08/16/2019 09:54 AM   Modules accepted: Orders

## 2019-08-16 NOTE — Progress Notes (Addendum)
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 For his prostate cancer he has received IM RT in the past. He has been seeing Dr. Erlene Quan in the past for his recurrent UTIs as well as hematuria. CT scan in July 2018 showed bladder wall thickening with perivascular edema and inflammation in the right ureter greater than the left. Findings were thought to be due to pyelonephritis at that time. He underwent cystoscopy on 12/14/2017 which showed abnormal looking prostate with necrotic material lining the entire surface area. Diffuse copious debris within the bladder appearing to be erythematous without discrete bladder tumor but visualization was poor. He was then admitted to the hospital on 12/26/2017 with symptoms of UTI and sepsis as well as new moderate bilateral hydronephrosis.  CT abdomen and pelvis with contrast on 12/20/2017 again showed market bile bladder wall thickening with perivesicular edema. Within the lumen of the bladder there is a 93.9 cm soft tissue attenuating filling defect. This is indeterminate and could represent an area of blood clot versus urothelial lesion.  He underwent repeat cystoscopy with bilateral pyelogram and ureteral stent placement as well as TURBT and TURP. He was found to have a massive tumor involving the  majority of the bladder with grossly necrotic and calcified material. There appeared to be multifocal disease with large burden of the left anterior bladder wall extending posteriorly as well as adjacent to the bladder neck and beyond the right hemitrigone. Very little normal recognizable bladder mucosa remaining.   Biopsy from TURBT and TURP showed: High-grade urothelial carcinoma with extensive necrosisinvolving both the bladder and the prostate.CT chest did not reveal any evidence of metastatic disease. He was also not found to have any regional adenopathy on CT abdomen  Dr. Erlene Quan performed interval TURBT on 01/15/18 and was able to debulk tumor as much as possible to reduce tumor burden  Patient received 5 cycles ofcarboplatin/ gemcitabine 2 weeks on and 1 week off ending 04/26/18.Patient did receive radiation for 10 days during cycle 2 of treatment. Given patients age, co-morbidities and frailty- he was not a cisplatin candidate.6th cycle not given due to fall and hip fracture  Patient had a repeat cystoscopy in September 2019 which showed no obvious tumor bladder but it did have a shaggy necrotic appearance at the bladder neck. Prostatic fossa was grossly abnormal necrotic and irregular without papillary change. Bladder neck was biopsied and showed residual high-grade urothelial carcinoma. Muscularis propria was not seen in that specimen.Due to evidence of recurrent/residual disease patient was started on Tecentriq on 07/26/2018  Patient noted to have local progression of his bladder cancer causing bowel obstruction in July 2020. He is status post diverting colostomy. He also has bilateral nephrostomies in place.  FGFR mutation testing was negative.  Third line padcev started after performance status showed some improvement   Interval history- no new issues since last week. He is still amblating some around the house. Denies any side  effects from chemo. He is extremely  hard of hearing  ECOG PS- 2 Pain scale- 0   Review of systems- Review of Systems  Constitutional: Positive for malaise/fatigue. Negative for chills, fever and weight loss.  HENT: Negative for congestion, ear discharge and nosebleeds.   Eyes: Negative for blurred vision.  Respiratory: Negative for cough, hemoptysis, sputum production, shortness of breath and wheezing.   Cardiovascular: Negative for chest pain, palpitations, orthopnea and claudication.  Gastrointestinal: Negative for abdominal pain, blood in stool, constipation, diarrhea, heartburn, melena, nausea and vomiting.  Genitourinary: Negative for dysuria, flank pain, frequency, hematuria and urgency.  Musculoskeletal: Negative for back pain, joint pain and myalgias.  Skin: Negative for rash.  Neurological: Negative for dizziness, tingling, focal weakness, seizures, weakness and headaches.  Endo/Heme/Allergies: Does not bruise/bleed easily.  Psychiatric/Behavioral: Negative for depression and suicidal ideas. The patient does not have insomnia.        No Known Allergies   Past Medical History:  Diagnosis Date   Anxiety    Arthritis    Bladder cancer ([place])    Dysrhythmia 07/2018   history of atrial flutter that worsens with anxiety   Femur fracture, right ([place]) 05/01/2018   GERD (gastroesophageal reflux disease)    History of recent blood transfusion 05/2018   Hypertension    Iron deficiency anemia 09/14/2018   Prostate cancer ([place]) 07/2018   cancer growing in prostate but not prostate cancer, it is from the bladder   Umbilical hernia [id number]/8676   Urinary retention 2019   foley catheter place 11/2017   UTI (urinary tract infection) 2019   frequent UTI's over last year   Wound eschar of foot 07/2018   left heal getting wrapped and requiring antibiotic cream. cracks open with weight bearing.     Past Surgical History:  Procedure Laterality Date   CARPAL TUNNEL RELEASE Right    CHOLECYSTECTOMY  2004     COLOSTOMY N/A 06/08/2019   Procedure: COLOSTOMY;  Surgeon: Herbert Pun, MD;  Location: [facility];  Service: General;  Laterality: N/A;   CYSTOGRAM  07/18/2018   Procedure: CYSTOGRAM;  Surgeon: [name], MD;  Location: [facility];  Service: Urology;;   Consuela Mimes W/ RETROGRADES Bilateral 07/18/2018   Procedure: CYSTOSCOPY WITH RETROGRADE PYELOGRAM;  Surgeon: [name], MD;  Location: [facility];  Service: Urology;  Laterality: Bilateral;   CYSTOSCOPY W/ URETERAL STENT PLACEMENT Bilateral 12/27/2017   Procedure: CYSTOSCOPY WITH RETROGRADE PYELOGRAM/URETERAL STENT PLACEMENT;  Surgeon: [name], MD;  Location: [facility];  Service: Urology;  Laterality: Bilateral;   CYSTOSCOPY W/ URETERAL STENT PLACEMENT Bilateral 07/18/2018   Procedure: CYSTOSCOPY WITH STENT REPLACEMENT (exchange);  Surgeon: [name], MD;  Location: [facility];  Service: Urology;  Laterality: Bilateral;   CYSTOSCOPY WITH STENT PLACEMENT Bilateral 11/12/2018   Procedure: [place] WITH STENT Exchange;  Surgeon: [name], MD;  Location: [facility];  Service: Urology;  Laterality: Bilateral;   INTRAMEDULLARY (IM) NAIL INTERTROCHANTERIC Right 05/02/2018   Procedure: INTRAMEDULLARY (IM) NAIL INTERTROCHANTRIC;  Surgeon: [name], MD;  Location: [facility];  Service: Orthopedics;  Laterality: Right;   IR NEPHROSTOMY EXCHANGE LEFT  03/25/2019   IR NEPHROSTOMY EXCHANGE LEFT  04/19/2019   IR NEPHROSTOMY EXCHANGE LEFT  05/31/2019   IR NEPHROSTOMY EXCHANGE LEFT  07/19/2019   IR NEPHROSTOMY EXCHANGE RIGHT  03/25/2019   IR NEPHROSTOMY EXCHANGE RIGHT  04/19/2019   IR NEPHROSTOMY EXCHANGE RIGHT  05/31/2019   IR NEPHROSTOMY EXCHANGE RIGHT  07/19/2019   IR NEPHROSTOMY PLACEMENT LEFT  02/23/2019  IR NEPHROSTOMY PLACEMENT RIGHT  02/23/2019   LAPAROTOMY N/A 06/08/2019   Procedure: EXPLORATORY LAPAROTOMY;  Surgeon: Herbert Pun, MD;  Location: [facility];  Service: General;  Laterality: N/A;   LEG  TENDON SURGERY Right 1958   PORTA CATH INSERTION N/A 01/22/2018   Procedure: PORTA CATH INSERTION;  Surgeon: [name], MD;  Location: [place] [facility];  Service: Cardiovascular;  Laterality: N/A;   TRANSURETHRAL RESECTION OF BLADDER TUMOR N/A 12/27/2017   Procedure: TRANSURETHRAL RESECTION OF BLADDER TUMOR (TURBT);  Surgeon: [name], MD;  Location: [facility];  Service: Urology;  Laterality: N/A;   TRANSURETHRAL RESECTION OF BLADDER TUMOR N/A 01/15/2018   Procedure: TRANSURETHRAL RESECTION OF BLADDER TUMOR (TURBT);  Surgeon: [name], MD;  Location: [facility];  Service: Urology;  Laterality: N/A;  Need 2 hrs for this case please   TRANSURETHRAL RESECTION OF BLADDER TUMOR N/A 07/18/2018   Procedure: TRANSURETHRAL RESECTION OF BLADDER TUMOR (TURBT);  Surgeon: [name], MD;  Location: [facility];  Service: Urology;  Laterality: N/A;    Social History   Socioeconomic History   Marital status: Married    Spouse name: ruth   Number of children: Not on file   Years of education: Not on file   Highest education level: Not on file  Occupational History   Occupation: retired    Comment: Barista strain: Patient refused   Food insecurity    Worry: Patient refused    Inability: Patient refused   Transportation needs    Medical: Patient refused    Non-medical: Patient refused  Tobacco Use   Smoking status: Never Smoker   Smokeless tobacco: Never Used  Substance and Sexual Activity   Alcohol use: No    Alcohol/week: 0.0 standard drinks   Drug use: No   Sexual activity: Not Currently  Lifestyle   Physical activity    Days per week: Patient refused    Minutes per session: Patient refused   Stress: Patient refused  Relationships   Social connections    Talks on phone: Patient refused    Gets together: Patient refused    Attends religious service: Patient refused    Active member of club or organization:  Patient refused    Attends meetings of clubs or organizations: Patient refused    Relationship status: Patient refused   Intimate partner violence    Fear of current or ex partner: Patient refused    Emotionally abused: Patient refused    Physically abused: Patient refused    Forced sexual activity: Patient refused  Other Topics Concern   Not on file  Social History Narrative   Not on file    Family History  Problem Relation Age of Onset   Cancer Mother    Chronic Renal Failure Mother    Heart disease Father      Current Outpatient Medications:    acetaminophen (TYLENOL) 500 MG tablet, Take 1,000 mg by mouth every 6 (six) hours as needed for moderate pain or headache. , Disp: , Rfl:    cetirizine (ZYRTEC) 10 MG tablet, Take 10 mg by mouth daily as needed for allergies. , Disp: , Rfl:    Cranberry-Cholecalciferol (SUPER CRANBERRY/VITAMIN D3) 4200-500 MG-UNIT CAPS, Take 1 capsule by mouth 2 (two) times daily., Disp: , Rfl:    dexamethasone (DECADRON) 4 MG tablet, Take 2 tablets (8 mg total) by mouth daily. Start the day after chemotherapy for 2 days. (Patient not taking: Reported on  08/08/2019), Disp: 30 tablet, Rfl: 1   feeding supplement, ENSURE ENLIVE, (ENSURE ENLIVE) LIQD, Take 237 mLs by mouth 2 (two) times daily between meals., Disp: 237 mL, Rfl: 12   ferrous sulfate 325 (65 FE) MG tablet, Take 1 tablet (325 mg total) by mouth 2 (two) times daily with a meal., Disp: , Rfl: 3   lidocaine-prilocaine (EMLA) cream, Apply 1 application topically as needed., Disp: 30 g, Rfl: 1   lidocaine-prilocaine (EMLA) cream, Apply to affected area once, Disp: 30 g, Rfl: 3   Multiple Vitamin (MULTIVITAMIN WITH MINERALS) TABS tablet, Take 1 tablet by mouth daily., Disp: , Rfl:    MYRBETRIQ 50 MG TB24 tablet, TAKE 1 TABLET BY MOUTH DAILY, Disp: 30 tablet, Rfl: 3   nystatin (NYAMYC) powder, Apply topically 2 (two) times daily. (Patient taking differently: Apply 1 g topically 2 (two)  times daily as needed. ), Disp: 60 g, Rfl: 3   ondansetron (ZOFRAN) 8 MG tablet, Take 1 tablet (8 mg total) by mouth 2 (two) times daily as needed for refractory nausea / vomiting. Start on day 3 after chemo. (Patient not taking: Reported on 08/08/2019), Disp: 30 tablet, Rfl: 1   oxyCODONE (OXY IR/ROXICODONE) 5 MG immediate release tablet, Take 1 tablet (5 mg total) by mouth every 4 (four) hours as needed for severe pain., Disp: 60 tablet, Rfl: 0   polyethylene glycol powder (MIRALAX) powder, Take 17 g by mouth daily as needed. Can increase to 3 times a day as needed for constipation but hold medication if has diarrhea, Disp: 255 g, Rfl: 0   prochlorperazine (COMPAZINE) 10 MG tablet, Take 1 tablet (10 mg total) by mouth every 6 (six) hours as needed (Nausea or vomiting). (Patient not taking: Reported on 08/08/2019), Disp: 30 tablet, Rfl: 1   vitamin B-12 (CYANOCOBALAMIN) 500 MCG tablet, Take 500 mcg by mouth daily., Disp: , Rfl:    zinc oxide 20 % ointment, Apply 1 application topically as needed for irritation., Disp: , Rfl:   Physical exam:  Vitals:   08/16/19 0906  BP: 116/62  Pulse: (!) 110  Temp: (!) 96.8 F (36 C)  TempSrc: Tympanic  Weight: 149 lb (67.6 kg)  Height: 5\' 11"  (1.803 m)   Physical Exam Constitutional:      Comments: Frail elderly gentleman sitting in a wheelchair.  Appears in no acute distress  HENT:     Head: Normocephalic and atraumatic.  Eyes:     Pupils: Pupils are equal, round, and reactive to light.  Neck:     Musculoskeletal: Normal range of motion.  Cardiovascular:     Rate and Rhythm: Regular rhythm. Tachycardia present.     Heart sounds: Normal heart sounds.  Pulmonary:     Effort: Pulmonary effort is normal.     Breath sounds: Normal breath sounds.  Abdominal:     General: Bowel sounds are normal.     Palpations: Abdomen is soft.     Comments: Midline diverting colostomy in place with formed stool.  Bilateral nephrostomy tubes in place draining  clear urine  Skin:    General: Skin is warm and dry.  Neurological:     Mental Status: He is alert and oriented to person, place, and time.      CMP Latest Ref Rng & Units 08/09/2019  Glucose 70 - 99 mg/dL 115(H)  BUN 8 - 23 mg/dL 16  Creatinine 0.61 - 1.24 mg/dL 0.95  Sodium 135 - 145 mmol/L 134(L)  Potassium 3.5 - 5.1 mmol/L 3.6  Chloride 98 - 111 mmol/L 102  CO2 22 - 32 mmol/L 24  Calcium 8.9 - 10.3 mg/dL 8.8(L)  Total Protein 6.5 - 8.1 g/dL 6.9  Total Bilirubin 0.3 - 1.2 mg/dL 0.3  Alkaline Phos 38 - 126 U/L 70  AST 15 - 41 U/L 13(L)  ALT 0 - 44 U/L 11   CBC Latest Ref Rng & Units 08/09/2019  WBC 4.0 - 10.5 K/uL 22.9(H)  Hemoglobin 13.0 - 17.0 g/dL 10.7(L)  Hematocrit 39.0 - 52.0 % 33.5(L)  Platelets 150 - 400 K/uL 404(H)    No images are attached to the encounter.  Ct Chest W Contrast  Result Date: 08/08/2019 CLINICAL DATA:  Restaging metastatic bladder cancer. History of prostate cancer. EXAM: CT CHEST, ABDOMEN, AND PELVIS WITH CONTRAST TECHNIQUE: Multidetector CT imaging of the chest, abdomen and pelvis was performed following the standard protocol during bolus administration of intravenous contrast. CONTRAST:  12mL OMNIPAQUE IOHEXOL 300 MG/ML  SOLN COMPARISON:  Multiple exams, including 06/06/2019 FINDINGS: CT CHEST FINDINGS Cardiovascular: Coronary, aortic arch, and branch vessel atherosclerotic vascular disease. Ectatic ascending thoracic aorta at 3.8 cm. Mediastinum/Nodes: Contrast medium in the distal esophagus possibly from dysmotility or reflux. Lungs/Pleura: Small bilateral pleural effusions. 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 Stable scarring posteriorly in the right upper lobe. Musculoskeletal: Considerable degenerative glenohumeral arthropathy bilaterally. Thoracic spondylosis. Bilateral gynecomastia. CT ABDOMEN PELVIS FINDINGS Hepatobiliary: Cholecystectomy.  Otherwise unremarkable. Pancreas: Unremarkable Spleen: Upper normal  splenic size. Adrenals/Urinary Tract: Stable fullness of the left adrenal gland. Bilateral percutaneous nephrostomies. Stable right mid kidney cyst. Stable mild renal atrophy. Urinary bladder appears grossly abnormal, with irregular wall thickening, irregular folds, internal gas density, complex septation anteriorly, internal air-fluid levels, and expansion of an anterior diverticular like structure herniating out in between the thickened rectus abdominus muscles. Surrounding indistinctness of fat planes potentially from inflammation or tumor. Poor definition of the interface between the prostate gland and the urinary bladder. Stomach/Bowel: Small duodenal diverticula. Prominence of stool in the rectum. Transverse colostomy in the upper abdomen, new compared to the prior exam. Proximal sigmoid colon wall thickening adjacent to the urinary bladder. Vascular/Lymphatic: Aortoiliac atherosclerotic vascular disease. No individually definable pathologically enlarged lymph nodes. Reproductive: Suspected prior transurethral resection of the prostate with a large defect in the upper prostate near the border with the urinary bladder. Enhancing margins along the urinary bladder margin. Other: No supplemental non-categorized findings. Musculoskeletal: Right hip screw. Bony demineralization. Severe degenerative left hip arthropathy. Deformity in the right proximal femur from prior fracture. Lumbar spondylosis. IMPRESSION: 1. Continued appearance of markedly irregular urinary bladder with irregular wall thickening, internal folds, internal air-fluid levels, and severe surrounding inflammatory findings effacing surrounding fat planes. There was previously an anterior diverticular type outpouching through the presumed tumor or inflammatory thickening in the anterior bladder, this is expanded and is now gas-filled and extends as a hernia in between the increasingly thickened inferior rectus musculature in into the subcutaneous space  as on image 108/2. This is in contiguity with the urinary bladder, and could represent an unusual diverticulum of the bladder, or simply breakdown of local tissues. Infection is certainly not excluded. Residual malignancy likewise is not excluded given this complex appearance. 2. There is thickening of the proximal sigmoid colon in the left lower quadrant adjacent to the urinary bladder which could be from secondary inflammation, or possibly treatment related effects if the patient has had prior radiation therapy in this region. 3. Other imaging findings of potential clinical significance: Aortic Atherosclerosis (ICD10-I70.0).  Coronary atherosclerosis. Esophageal dysmotility versus reflux. Small bilateral pleural effusions. Remote asbestos exposure. Severe glenohumeral and hip arthropathy bilaterally. Bilateral gynecomastia. Bilateral percutaneous nephrostomies without hydronephrosis. New transverse colostomy in the upper abdomen without complicating feature. Prior TURP. Bony demineralization. Electronically Signed   By: Van Clines M.D.   On: 08/08/2019 10:25   Ct Abdomen Pelvis W Contrast  Result Date: 08/08/2019 CLINICAL DATA:  Restaging metastatic bladder cancer. History of prostate cancer. EXAM: CT CHEST, ABDOMEN, AND PELVIS WITH CONTRAST TECHNIQUE: Multidetector CT imaging of the chest, abdomen and pelvis was performed following the standard protocol during bolus administration of intravenous contrast. CONTRAST:  160mL OMNIPAQUE IOHEXOL 300 MG/ML  SOLN COMPARISON:  Multiple exams, including 06/06/2019 FINDINGS: CT CHEST FINDINGS Cardiovascular: Coronary, aortic arch, and branch vessel atherosclerotic vascular disease. Ectatic ascending thoracic aorta at 3.8 cm. Mediastinum/Nodes: Contrast medium in the distal esophagus possibly from dysmotility or reflux. Lungs/Pleura: Small bilateral pleural effusions. 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 Stable scarring  posteriorly in the right upper lobe. Musculoskeletal: Considerable degenerative glenohumeral arthropathy bilaterally. Thoracic spondylosis. Bilateral gynecomastia. CT ABDOMEN PELVIS FINDINGS Hepatobiliary: Cholecystectomy.  Otherwise unremarkable. Pancreas: Unremarkable Spleen: Upper normal splenic size. Adrenals/Urinary Tract: Stable fullness of the left adrenal gland. Bilateral percutaneous nephrostomies. Stable right mid kidney cyst. Stable mild renal atrophy. Urinary bladder appears grossly abnormal, with irregular wall thickening, irregular folds, internal gas density, complex septation anteriorly, internal air-fluid levels, and expansion of an anterior diverticular like structure herniating out in between the thickened rectus abdominus muscles. Surrounding indistinctness of fat planes potentially from inflammation or tumor. Poor definition of the interface between the prostate gland and the urinary bladder. Stomach/Bowel: Small duodenal diverticula. Prominence of stool in the rectum. Transverse colostomy in the upper abdomen, new compared to the prior exam. Proximal sigmoid colon wall thickening adjacent to the urinary bladder. Vascular/Lymphatic: Aortoiliac atherosclerotic vascular disease. No individually definable pathologically enlarged lymph nodes. Reproductive: Suspected prior transurethral resection of the prostate with a large defect in the upper prostate near the border with the urinary bladder. Enhancing margins along the urinary bladder margin. Other: No supplemental non-categorized findings. Musculoskeletal: Right hip screw. Bony demineralization. Severe degenerative left hip arthropathy. Deformity in the right proximal femur from prior fracture. Lumbar spondylosis. IMPRESSION: 1. Continued appearance of markedly irregular urinary bladder with irregular wall thickening, internal folds, internal air-fluid levels, and severe surrounding inflammatory findings effacing surrounding fat planes. There was  previously an anterior diverticular type outpouching through the presumed tumor or inflammatory thickening in the anterior bladder, this is expanded and is now gas-filled and extends as a hernia in between the increasingly thickened inferior rectus musculature in into the subcutaneous space as on image 108/2. This is in contiguity with the urinary bladder, and could represent an unusual diverticulum of the bladder, or simply breakdown of local tissues. Infection is certainly not excluded. Residual malignancy likewise is not excluded given this complex appearance. 2. There is thickening of the proximal sigmoid colon in the left lower quadrant adjacent to the urinary bladder which could be from secondary inflammation, or possibly treatment related effects if the patient has had prior radiation therapy in this region. 3. Other imaging findings of potential clinical significance: Aortic Atherosclerosis (ICD10-I70.0). Coronary atherosclerosis. Esophageal dysmotility versus reflux. Small bilateral pleural effusions. Remote asbestos exposure. Severe glenohumeral and hip arthropathy bilaterally. Bilateral gynecomastia. Bilateral percutaneous nephrostomies without hydronephrosis. New transverse colostomy in the upper abdomen without complicating feature. Prior TURP. Bony demineralization. Electronically Signed   By: Cindra Eves.D.  On: 08/08/2019 10:25   Ir Nephrostomy Exchange Left  Result Date: 07/19/2019 INDICATION: History of bladder cancer with chronic bilateral percutaneous nephrostomy catheters. Please perform fluoroscopic guided exchange. Note, since the time of the patient's previous exchange, he has undergone a diverting colostomy secondary to direct colonic erosion of his bladder cancer and subsequent obstruction. EXAM: FLUOROSCOPIC GUIDED BILATERAL SIDED NEPHROSTOMY CATHETER EXCHANGE COMPARISON:  Multiple previous fluoroscopic guided nephrostomy catheter exchanges, most recently on 05/31/2019; CT  abdomen pelvis-06/06/2019 CONTRAST:  A total of 15 mL Isovue-300 administered was administered into both collecting systems FLUOROSCOPY TIME:  36 seconds (13 mGy) COMPLICATIONS: None immediate. TECHNIQUE: Informed written consent was obtained from the patient after a discussion of the risks, benefits and alternatives to treatment. Questions regarding the procedure were encouraged and answered. A timeout was performed prior to the initiation of the procedure. The bilateral flanks and external portions of existing nephrostomy catheters were prepped and draped in the usual sterile fashion. A sterile drape was applied covering the operative field. Maximum barrier sterile technique with sterile gowns and gloves were used for the procedure. A timeout was performed prior to the initiation of the procedure. A pre procedural spot fluoroscopic image was obtained. Beginning with the left-sided nephrostomy, a small amount of contrast was injected via the existing left-sided nephrostomy catheter demonstrating appropriate positioning within the renal pelvis. The existing nephrostomy catheter was cut and cannulated with a Benson wire which was coiled within the renal pelvis. Under intermittent fluoroscopic guidance, the existing nephrostomy catheter was exchanged for a new 12 [place] all-purpose drainage catheter. Limited contrast injection confirmed appropriate positioning within the left renal pelvis and a post exchange fluoroscopic image was obtained. The catheter was locked, secured to the skin with an interrupted suture and reconnected to a gravity bag. The identical repeat procedure was repeated for the contralateral right-sided nephrostomy, ultimately allowing successful exchange of a new 12 [place] all-purpose drainage catheter with end coiled and locked within the right renal pelvis. Dressings were placed. The patient tolerated the above procedures well without immediate postprocedural complication. FINDINGS: The existing  nephrostomy catheters are appropriately positioned and functioning. After successful fluoroscopic guided exchange, new bilateral 12 French nephrostomy catheters are coiled and locked within the respective renal pelvises. IMPRESSION: Successful fluoroscopic guided exchange of bilateral 12 French percutaneous nephrostomy catheters. Electronically Signed   By: Sandi Mariscal M.D.   On: 07/19/2019 12:33   Ir Nephrostomy Exchange Right  Result Date: 07/19/2019 INDICATION: History of bladder cancer with chronic bilateral percutaneous nephrostomy catheters. Please perform fluoroscopic guided exchange. Note, since the time of the patient's previous exchange, he has undergone a diverting colostomy secondary to direct colonic erosion of his bladder cancer and subsequent obstruction. EXAM: FLUOROSCOPIC GUIDED BILATERAL SIDED NEPHROSTOMY CATHETER EXCHANGE COMPARISON:  Multiple previous fluoroscopic guided nephrostomy catheter exchanges, most recently on 05/31/2019; CT abdomen pelvis-06/06/2019 CONTRAST:  A total of 15 mL Isovue-300 administered was administered into both collecting systems FLUOROSCOPY TIME:  36 seconds (13 mGy) COMPLICATIONS: None immediate. TECHNIQUE: Informed written consent was obtained from the patient after a discussion of the risks, benefits and alternatives to treatment. Questions regarding the procedure were encouraged and answered. A timeout was performed prior to the initiation of the procedure. The bilateral flanks and external portions of existing nephrostomy catheters were prepped and draped in the usual sterile fashion. A sterile drape was applied covering the operative field. Maximum barrier sterile technique with sterile gowns and gloves were used for the procedure. A timeout was performed prior to the initiation  of the procedure. A pre procedural spot fluoroscopic image was obtained. Beginning with the left-sided nephrostomy, a small amount of contrast was injected via the existing left-sided  nephrostomy catheter demonstrating appropriate positioning within the renal pelvis. The existing nephrostomy catheter was cut and cannulated with a Benson wire which was coiled within the renal pelvis. Under intermittent fluoroscopic guidance, the existing nephrostomy catheter was exchanged for a new 12 [place] all-purpose drainage catheter. Limited contrast injection confirmed appropriate positioning within the left renal pelvis and a post exchange fluoroscopic image was obtained. The catheter was locked, secured to the skin with an interrupted suture and reconnected to a gravity bag. The identical repeat procedure was repeated for the contralateral right-sided nephrostomy, ultimately allowing successful exchange of a new 12 [place] all-purpose drainage catheter with end coiled and locked within the right renal pelvis. Dressings were placed. The patient tolerated the above procedures well without immediate postprocedural complication. FINDINGS: The existing nephrostomy catheters are appropriately positioned and functioning. After successful fluoroscopic guided exchange, new bilateral 12 French nephrostomy catheters are coiled and locked within the respective renal pelvises. IMPRESSION: Successful fluoroscopic guided exchange of bilateral 12 French percutaneous nephrostomy catheters. Electronically Signed   By: Sandi Mariscal M.D.   On: 07/19/2019 12:33     Assessment and plan- Patient is a 84 y.o. male with locally advanced muscle invasive urothelial carcinoma now involving bowel causing bowel obstruction status post diverting colostomy. He is here for on treatment assessment prior to cycle 1 day 8 of padcev  Counts okay to proceed with cycle 1 day 8 of Padcev today.  He tolerated cycle 1 day 1 of treatment well with no significant side effects.  His counts are overall stable.  He does have baseline leukocytosis likely secondary to malignancy.  We have ruled out infection and treated him with antibiotics in the  past as well and his leukocytosis has not really resolved despite that.  His hemoglobin remained stable around 12.  I will see him back in 2 weeks time for cycle 2-day 1.  Given his age and frailty I am not doing cycle 15 at this time.  I am concerned that he may get worsening neuropathy with this regimen which may further affect his mobility and leads to frequent falls.  I have also dose reduced Padcev to 1 mg per metered square  Patient has baseline chronic sinus tachycardia.  Continue to monitor.  He will receive his flu shot today.   Visit Diagnosis 1. Encounter for antineoplastic chemotherapy   2. Urothelial cancer ([place])   3. Anemia of chronic disease      Dr. Randa Evens, MD, MPH [facility] [phone] 08/16/2019 8:04 AM

## 2019-08-16 NOTE — Progress Notes (Signed)
MD ok with Padcev dose being outside of 10% cutoff

## 2019-08-22 ENCOUNTER — Other Ambulatory Visit: Payer: Self-pay | Admitting: Radiology

## 2019-08-22 DIAGNOSIS — N1339 Other hydronephrosis: Secondary | ICD-10-CM

## 2019-08-26 ENCOUNTER — Other Ambulatory Visit: Payer: Self-pay | Admitting: Radiology

## 2019-08-26 DIAGNOSIS — N390 Urinary tract infection, site not specified: Secondary | ICD-10-CM

## 2019-08-26 MED ORDER — CIPROFLOXACIN HCL 500 MG PO TABS: 500.0000 mg | ORAL_TABLET | Freq: Two times a day (BID) | ORAL | 0 refills | Status: DC

## 2019-08-29 ENCOUNTER — Other Ambulatory Visit: Payer: Self-pay

## 2019-08-29 ENCOUNTER — Ambulatory Visit: Payer: Medicare Other

## 2019-08-29 ENCOUNTER — Encounter: Payer: Self-pay | Admitting: Oncology

## 2019-08-30 ENCOUNTER — Inpatient Hospital Stay: Payer: Medicare Other

## 2019-08-30 ENCOUNTER — Telehealth: Payer: Self-pay | Admitting: *Deleted

## 2019-08-30 ENCOUNTER — Other Ambulatory Visit: Payer: Self-pay | Admitting: *Deleted

## 2019-08-30 ENCOUNTER — Other Ambulatory Visit: Payer: Self-pay

## 2019-08-30 ENCOUNTER — Inpatient Hospital Stay (HOSPITAL_BASED_OUTPATIENT_CLINIC_OR_DEPARTMENT_OTHER): Payer: Medicare Other | Admitting: Oncology

## 2019-08-30 VITALS — HR 87

## 2019-08-30 VITALS — BP 97/60 | HR 105 | Temp 97.0°F | Ht 71.0 in | Wt 150.0 lb

## 2019-08-30 DIAGNOSIS — L03818 Cellulitis of other sites: Secondary | ICD-10-CM | POA: Diagnosis not present

## 2019-08-30 DIAGNOSIS — C689 Malignant neoplasm of urinary organ, unspecified: Secondary | ICD-10-CM | POA: Diagnosis not present

## 2019-08-30 DIAGNOSIS — Z95828 Presence of other vascular implants and grafts: Secondary | ICD-10-CM

## 2019-08-30 DIAGNOSIS — Z5111 Encounter for antineoplastic chemotherapy: Secondary | ICD-10-CM | POA: Diagnosis not present

## 2019-08-30 DIAGNOSIS — C679 Malignant neoplasm of bladder, unspecified: Secondary | ICD-10-CM

## 2019-08-30 LAB — COMPREHENSIVE METABOLIC PANEL
ALT: 10 U/L (ref 0–44)
AST: 13 U/L — ABNORMAL LOW (ref 15–41)
Albumin: 2.5 g/dL — ABNORMAL LOW (ref 3.5–5.0)
Alkaline Phosphatase: 78 U/L (ref 38–126)
Anion gap: 10 (ref 5–15)
BUN: 18 mg/dL (ref 8–23)
CO2: 23 mmol/L (ref 22–32)
Calcium: 8.5 mg/dL — ABNORMAL LOW (ref 8.9–10.3)
Chloride: 99 mmol/L (ref 98–111)
Creatinine, Ser: 0.87 mg/dL (ref 0.61–1.24)
GFR calc Af Amer: >60 mL/min (ref >60)
GFR calc non Af Amer: >60 mL/min (ref >60)
Glucose, Bld: 134 mg/dL — ABNORMAL HIGH (ref 70–99)
Potassium: 3.8 mmol/L (ref 3.5–5.1)
Sodium: 132 mmol/L — ABNORMAL LOW (ref 135–145)
Total Bilirubin: 0.5 mg/dL (ref 0.3–1.2)
Total Protein: 6.3 g/dL — ABNORMAL LOW (ref 6.5–8.1)

## 2019-08-30 LAB — CBC WITH DIFFERENTIAL/PLATELET
Abs Immature Granulocytes: 0.13 10*3/uL — ABNORMAL HIGH (ref 0.00–0.07)
Basophils Absolute: 0.1 10*3/uL (ref 0.0–0.1)
Basophils Relative: 1 %
Eosinophils Absolute: 0.3 10*3/uL (ref 0.0–0.5)
Eosinophils Relative: 1 %
HCT: 34.9 % — ABNORMAL LOW (ref 39.0–52.0)
Hemoglobin: 11 g/dL — ABNORMAL LOW (ref 13.0–17.0)
Immature Granulocytes: 1 %
Lymphocytes Relative: 10 %
Lymphs Abs: 2.3 10*3/uL (ref 0.7–4.0)
MCH: 27.7 pg (ref 26.0–34.0)
MCHC: 31.5 g/dL (ref 30.0–36.0)
MCV: 87.9 fL (ref 80.0–100.0)
Monocytes Absolute: 1.3 10*3/uL — ABNORMAL HIGH (ref 0.1–1.0)
Monocytes Relative: 6 %
Neutro Abs: 19.1 10*3/uL — ABNORMAL HIGH (ref 1.7–7.7)
Neutrophils Relative %: 81 %
Platelets: 338 10*3/uL (ref 150–400)
RBC: 3.97 MIL/uL — ABNORMAL LOW (ref 4.22–5.81)
RDW: 16.5 % — ABNORMAL HIGH (ref 11.5–15.5)
WBC: 23.3 10*3/uL — ABNORMAL HIGH (ref 4.0–10.5)
nRBC: 0 % (ref 0.0–0.2)

## 2019-08-30 MED ORDER — SODIUM CHLORIDE 0.9 % IV SOLN
60.0000 mg | Freq: Once | INTRAVENOUS | Status: AC
  Administered 2019-08-30: 60 mg via INTRAVENOUS
  Filled 2019-08-30: qty 6

## 2019-08-30 MED ORDER — PALONOSETRON HCL INJECTION 0.25 MG/5ML
0.2500 mg | Freq: Once | INTRAVENOUS | Status: AC
  Administered 2019-08-30: 0.25 mg via INTRAVENOUS
  Filled 2019-08-30: qty 5

## 2019-08-30 MED ORDER — DEXAMETHASONE SODIUM PHOSPHATE 10 MG/ML IJ SOLN
10.0000 mg | Freq: Once | INTRAMUSCULAR | Status: AC
  Administered 2019-08-30: 10 mg via INTRAVENOUS
  Filled 2019-08-30: qty 1

## 2019-08-30 MED ORDER — SODIUM CHLORIDE 0.9% FLUSH
10.0000 mL | Freq: Once | INTRAVENOUS | Status: AC
  Administered 2019-08-30: 09:00:00 10 mL via INTRAVENOUS
  Filled 2019-08-30: qty 10

## 2019-08-30 MED ORDER — HEPARIN SOD (PORK) LOCK FLUSH 100 UNIT/ML IV SOLN
500.0000 [IU] | Freq: Once | INTRAVENOUS | Status: AC | PRN
  Administered 2019-08-30: 500 [IU]
  Filled 2019-08-30: qty 5

## 2019-08-30 MED ORDER — SODIUM CHLORIDE 0.9 % IV SOLN: 10.0000 mg | Freq: Once | INTRAVENOUS | Status: DC

## 2019-08-30 MED ORDER — AMOXICILLIN-POT CLAVULANATE 875-125 MG PO TABS: 1.0000 | ORAL_TABLET | Freq: Two times a day (BID) | ORAL | 0 refills | Status: DC

## 2019-08-30 MED ORDER — SODIUM CHLORIDE 0.9 % IV SOLN
Freq: Once | INTRAVENOUS | Status: AC
  Administered 2019-08-30: 10:00:00 via INTRAVENOUS
  Filled 2019-08-30: qty 250

## 2019-08-30 NOTE — Telephone Encounter (Signed)
Called wife and let her know that Dr [name] wanted pt to have atb for pt. Because of the redness and swollen area that was on his abdomen. Wife states that she will go and get it and start it .

## 2019-09-01 NOTE — Progress Notes (Signed)
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 For his prostate cancer he has received IM RT in the past. He has been seeing Dr. Erlene Quan in the past for his recurrent UTIs as well as hematuria. CT scan in July 2018 showed bladder wall thickening with perivascular edema and inflammation in the right ureter greater than the left. Findings were thought to be due to pyelonephritis at that time. He underwent cystoscopy on 12/14/2017 which showed abnormal looking prostate with necrotic material lining the entire surface area. Diffuse copious debris within the bladder appearing to be erythematous without discrete bladder tumor but visualization was poor. He was then admitted to the hospital on 12/26/2017 with symptoms of UTI and sepsis as well as new moderate bilateral hydronephrosis.  CT abdomen and pelvis with contrast on 12/20/2017 again showed market bile bladder wall thickening with perivesicular edema. Within the lumen of the bladder there is a 93.9 cm soft tissue attenuating filling defect. This is indeterminate and could represent an area of blood clot versus urothelial lesion.  He underwent repeat cystoscopy with bilateral pyelogram and ureteral stent placement as well as TURBT and TURP. He was found to have a massive tumor involving the  majority of the bladder with grossly necrotic and calcified material. There appeared to be multifocal disease with large burden of the left anterior bladder wall extending posteriorly as well as adjacent to the bladder neck and beyond the right hemitrigone. Very little normal recognizable bladder mucosa remaining.   Biopsy from TURBT and TURP showed: High-grade urothelial carcinoma with extensive necrosisinvolving both the bladder and the prostate.CT chest did not reveal any evidence of metastatic disease. He was also not found to have any regional adenopathy on CT abdomen  Dr. Erlene Quan performed interval TURBT on 01/15/18 and was able to debulk tumor as much as possible to reduce tumor burden  Patient received 5 cycles ofcarboplatin/ gemcitabine 2 weeks on and 1 week off ending 04/26/18.Patient did receive radiation for 10 days during cycle 2 of treatment. Given patients age, co-morbidities and frailty- he was not a cisplatin candidate.6th cycle not given due to fall and hip fracture  Patient had a repeat cystoscopy in September 2019 which showed no obvious tumor bladder but it did have a shaggy necrotic appearance at the bladder neck. Prostatic fossa was grossly abnormal necrotic and irregular without papillary change. Bladder neck was biopsied and showed residual high-grade urothelial carcinoma. Muscularis propria was not seen in that specimen.Due to evidence of recurrent/residual disease patient was started on Tecentriq on 07/26/2018  Patient noted to have local progression of his bladder cancer causing bowel obstruction in July 2020. He is status post diverting colostomy. He also has bilateral nephrostomies in place.  FGFR mutation testing was negative.  Third line padcev started after performance status showed some improvement   Interval history-patient reports pain and swelling in his lower part of the abdomen.  Colostomy and nephrostomy tubes are functioning  well.  I also  spoke to his wife over the phone who reported patient feels at his baseline and has been ambulating around the house with a walker.  He has not had any falls or fever.   ECOG PS- 2 Pain scale- 4 Opioid associated constipation- no  Review of systems- Review of Systems  Constitutional: Positive for malaise/fatigue. Negative for chills, fever and weight loss.  HENT: Negative for congestion, ear discharge and nosebleeds.   Eyes: Negative for blurred vision.  Respiratory: Negative for cough, hemoptysis, sputum production, shortness of breath and wheezing.   Cardiovascular: Negative for chest pain, palpitations, orthopnea and claudication.  Gastrointestinal: Positive for abdominal pain. Negative for blood in stool, constipation, diarrhea, heartburn, melena, nausea and vomiting.  Genitourinary: Negative for dysuria, flank pain, frequency, hematuria and urgency.  Musculoskeletal: Negative for back pain, joint pain and myalgias.  Skin: Negative for rash.  Neurological: Negative for dizziness, tingling, focal weakness, seizures, weakness and headaches.  Endo/Heme/Allergies: Does not bruise/bleed easily.  Psychiatric/Behavioral: Negative for depression and suicidal ideas. The patient does not have insomnia.       No Known Allergies   Past Medical History:  Diagnosis Date   Anxiety    Arthritis    Bladder cancer ([place])    Dysrhythmia 07/2018   history of atrial flutter that worsens with anxiety   Femur fracture, right ([place]) 05/01/2018   GERD (gastroesophageal reflux disease)    History of recent blood transfusion 05/2018   Hypertension    Iron deficiency anemia 09/14/2018   Prostate cancer ([place]) 07/2018   cancer growing in prostate but not prostate cancer, it is from the bladder   Umbilical hernia 92/4268   Urinary retention 2019   foley catheter place 11/2017   UTI (urinary tract infection) 2019   frequent UTI's over last year   Wound eschar of foot 07/2018   left heal  getting wrapped and requiring antibiotic cream. cracks open with weight bearing.     Past Surgical History:  Procedure Laterality Date   CARPAL TUNNEL RELEASE Right    CHOLECYSTECTOMY  2004   COLOSTOMY N/A 06/08/2019   Procedure: COLOSTOMY;  Surgeon: Herbert Pun, MD;  Location: [facility];  Service: General;  Laterality: N/A;   CYSTOGRAM  07/18/2018   Procedure: CYSTOGRAM;  Surgeon: [name], MD;  Location: [facility];  Service: Urology;;   Consuela Mimes W/ RETROGRADES Bilateral 07/18/2018   Procedure: CYSTOSCOPY WITH RETROGRADE PYELOGRAM;  Surgeon: [name], MD;  Location: [facility];  Service: Urology;  Laterality: Bilateral;   CYSTOSCOPY W/ URETERAL STENT PLACEMENT Bilateral 12/27/2017   Procedure: CYSTOSCOPY WITH RETROGRADE PYELOGRAM/URETERAL STENT PLACEMENT;  Surgeon: [name], MD;  Location: [facility];  Service: Urology;  Laterality: Bilateral;   CYSTOSCOPY W/ URETERAL STENT PLACEMENT Bilateral 07/18/2018   Procedure: CYSTOSCOPY WITH STENT REPLACEMENT (exchange);  Surgeon: [name], MD;  Location: [facility];  Service: Urology;  Laterality: Bilateral;   CYSTOSCOPY WITH STENT PLACEMENT Bilateral 11/12/2018   Procedure: [place] WITH STENT Exchange;  Surgeon: [name], MD;  Location: [facility];  Service: Urology;  Laterality: Bilateral;   INTRAMEDULLARY (IM) NAIL INTERTROCHANTERIC Right 05/02/2018   Procedure: INTRAMEDULLARY (IM) NAIL INTERTROCHANTRIC;  Surgeon: [name], MD;  Location: [facility];  Service: Orthopedics;  Laterality: Right;   IR NEPHROSTOMY EXCHANGE LEFT  03/25/2019   IR NEPHROSTOMY EXCHANGE LEFT  04/19/2019   IR NEPHROSTOMY EXCHANGE LEFT  05/31/2019   IR NEPHROSTOMY EXCHANGE LEFT  07/19/2019   IR NEPHROSTOMY EXCHANGE RIGHT  03/25/2019  IR NEPHROSTOMY EXCHANGE RIGHT  04/19/2019   IR NEPHROSTOMY EXCHANGE RIGHT  05/31/2019   IR NEPHROSTOMY EXCHANGE RIGHT  07/19/2019   IR NEPHROSTOMY PLACEMENT LEFT  02/23/2019   IR NEPHROSTOMY  PLACEMENT RIGHT  02/23/2019   LAPAROTOMY N/A 06/08/2019   Procedure: EXPLORATORY LAPAROTOMY;  Surgeon: Herbert Pun, MD;  Location: [facility];  Service: General;  Laterality: N/A;   LEG TENDON SURGERY Right 1958   PORTA CATH INSERTION N/A 01/22/2018   Procedure: PORTA CATH INSERTION;  Surgeon: [name], MD;  Location: [facility];  Service: Cardiovascular;  Laterality: N/A;   TRANSURETHRAL RESECTION OF BLADDER TUMOR N/A 12/27/2017   Procedure: TRANSURETHRAL RESECTION OF BLADDER TUMOR (TURBT);  Surgeon: [name], MD;  Location: [facility];  Service: Urology;  Laterality: N/A;   TRANSURETHRAL RESECTION OF BLADDER TUMOR N/A 01/15/2018   Procedure: TRANSURETHRAL RESECTION OF BLADDER TUMOR (TURBT);  Surgeon: [name], MD;  Location: [facility];  Service: Urology;  Laterality: N/A;  Need 2 hrs for this case please   TRANSURETHRAL RESECTION OF BLADDER TUMOR N/A 07/18/2018   Procedure: TRANSURETHRAL RESECTION OF BLADDER TUMOR (TURBT);  Surgeon: [name], MD;  Location: [facility];  Service: Urology;  Laterality: N/A;    Social History   Socioeconomic History   Marital status: Married    Spouse name: ruth   Number of children: Not on file   Years of education: Not on file   Highest education level: Not on file  Occupational History   Occupation: retired    Comment: Barista strain: Patient refused   Food insecurity    Worry: Patient refused    Inability: Patient refused   Transportation needs    Medical: Patient refused    Non-medical: Patient refused  Tobacco Use   Smoking status: Never Smoker   Smokeless tobacco: Never Used  Substance and Sexual Activity   Alcohol use: No    Alcohol/week: 0.0 standard drinks   Drug use: No   Sexual activity: Not Currently  Lifestyle   Physical activity    Days per week: Patient refused    Minutes per session: Patient refused   Stress: Patient refused    Relationships   Social connections    Talks on phone: Patient refused    Gets together: Patient refused    Attends religious service: Patient refused    Active member of club or organization: Patient refused    Attends meetings of clubs or organizations: Patient refused    Relationship status: Patient refused   Intimate partner violence    Fear of current or ex partner: Patient refused    Emotionally abused: Patient refused    Physically abused: Patient refused    Forced sexual activity: Patient refused  Other Topics Concern   Not on file  Social History Narrative   Not on file    Family History  Problem Relation Age of Onset   Cancer Mother    Chronic Renal Failure Mother    Heart disease Father      Current Outpatient Medications:    acetaminophen (TYLENOL) 500 MG tablet, Take 1,000 mg by mouth every 6 (six) hours as needed for moderate pain or headache. , Disp: , Rfl:    cetirizine (ZYRTEC) 10 MG tablet, Take 10 mg by mouth daily as needed for allergies. , Disp: , Rfl:    Cranberry-Cholecalciferol (SUPER CRANBERRY/VITAMIN D3) 4200-500 MG-UNIT CAPS, Take 1 capsule by mouth 2 (two) times daily.,  Disp: , Rfl:    dexamethasone (DECADRON) 4 MG tablet, Take 2 tablets (8 mg total) by mouth daily. Start the day after chemotherapy for 2 days., Disp: 30 tablet, Rfl: 1   feeding supplement, ENSURE ENLIVE, (ENSURE ENLIVE) LIQD, Take 237 mLs by mouth 2 (two) times daily between meals., Disp: 237 mL, Rfl: 12   ferrous sulfate 325 (65 FE) MG tablet, Take 1 tablet (325 mg total) by mouth 2 (two) times daily with a meal., Disp: , Rfl: 3   lidocaine-prilocaine (EMLA) cream, Apply 1 application topically as needed., Disp: 30 g, Rfl: 1   Multiple Vitamin (MULTIVITAMIN WITH MINERALS) TABS tablet, Take 1 tablet by mouth daily., Disp: , Rfl:    MYRBETRIQ 50 MG TB24 tablet, TAKE 1 TABLET BY MOUTH DAILY, Disp: 30 tablet, Rfl: 3   nystatin (NYAMYC) powder, Apply topically 2 (two)  times daily. (Patient taking differently: Apply 1 g topically 2 (two) times daily as needed. ), Disp: 60 g, Rfl: 3   ondansetron (ZOFRAN) 8 MG tablet, Take 1 tablet (8 mg total) by mouth 2 (two) times daily as needed for refractory nausea / vomiting. Start on day 3 after chemo., Disp: 30 tablet, Rfl: 1   oxyCODONE (OXY IR/ROXICODONE) 5 MG immediate release tablet, Take 1 tablet (5 mg total) by mouth every 4 (four) hours as needed for severe pain., Disp: 60 tablet, Rfl: 0   polyethylene glycol powder (MIRALAX) powder, Take 17 g by mouth daily as needed. Can increase to 3 times a day as needed for constipation but hold medication if has diarrhea, Disp: 255 g, Rfl: 0   prochlorperazine (COMPAZINE) 10 MG tablet, Take 1 tablet (10 mg total) by mouth every 6 (six) hours as needed (Nausea or vomiting)., Disp: 30 tablet, Rfl: 1   vitamin B-12 (CYANOCOBALAMIN) 500 MCG tablet, Take 500 mcg by mouth daily., Disp: , Rfl:    zinc oxide 20 % ointment, Apply 1 application topically as needed for irritation., Disp: , Rfl:    amoxicillin-clavulanate (AUGMENTIN) 875-125 MG tablet, Take 1 tablet by mouth 2 (two) times daily., Disp: 14 tablet, Rfl: 0  Physical exam:  Vitals:   08/30/19 0923 08/30/19 0928  BP:  97/60  Pulse:  (!) 105  Temp: (!) 97 F (36.1 C) (!) 97 F (36.1 C)  TempSrc: Tympanic Tympanic  Weight: 150 lb (68 kg) 150 lb (68 kg)  Height: 5\' 11"  (1.803 m) 5\' 11"  (1.803 m)   Physical Exam Constitutional:      Comments: Frail elderly gentleman sitting in a wheelchair.  HENT:     Head: Normocephalic and atraumatic.  Eyes:     Pupils: Pupils are equal, round, and reactive to light.  Neck:     Musculoskeletal: Normal range of motion.  Cardiovascular:     Rate and Rhythm: Regular rhythm. Tachycardia present.     Heart sounds: Normal heart sounds.  Pulmonary:     Effort: Pulmonary effort is normal.     Breath sounds: Normal breath sounds.  Abdominal:     General: Bowel sounds are  normal.     Palpations: Abdomen is soft.     Comments: Midline colostomy in place with good stool output.  Bilateral nephrostomy tubes are functioning well.  There is induration felt over her lower abdominal wall with a focal area of oval 4 cm redness  Skin:    General: Skin is warm and dry.  Neurological:     Mental Status: He is alert and oriented to person,  place, and time.      CMP Latest Ref Rng & Units 08/30/2019  Glucose 70 - 99 mg/dL 134(H)  BUN 8 - 23 mg/dL 18  Creatinine 0.61 - 1.24 mg/dL 0.87  Sodium 135 - 145 mmol/L 132(L)  Potassium 3.5 - 5.1 mmol/L 3.8  Chloride 98 - 111 mmol/L 99  CO2 22 - 32 mmol/L 23  Calcium 8.9 - 10.3 mg/dL 8.5(L)  Total Protein 6.5 - 8.1 g/dL 6.3(L)  Total Bilirubin 0.3 - 1.2 mg/dL 0.5  Alkaline Phos 38 - 126 U/L 78  AST 15 - 41 U/L 13(L)  ALT 0 - 44 U/L 10   CBC Latest Ref Rng & Units 08/30/2019  WBC 4.0 - 10.5 K/uL 23.3(H)  Hemoglobin 13.0 - 17.0 g/dL 11.0(L)  Hematocrit 39.0 - 52.0 % 34.9(L)  Platelets 150 - 400 K/uL 338    No images are attached to the encounter.  Ct Chest W Contrast  Result Date: 08/08/2019 CLINICAL DATA:  Restaging metastatic bladder cancer. History of prostate cancer. EXAM: CT CHEST, ABDOMEN, AND PELVIS WITH CONTRAST TECHNIQUE: Multidetector CT imaging of the chest, abdomen and pelvis was performed following the standard protocol during bolus administration of intravenous contrast. CONTRAST:  146mL OMNIPAQUE IOHEXOL 300 MG/ML  SOLN COMPARISON:  Multiple exams, including 06/06/2019 FINDINGS: CT CHEST FINDINGS Cardiovascular: Coronary, aortic arch, and branch vessel atherosclerotic vascular disease. Ectatic ascending thoracic aorta at 3.8 cm. Mediastinum/Nodes: Contrast medium in the distal esophagus possibly from dysmotility or reflux. Lungs/Pleura: Small bilateral pleural effusions. 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 Stable scarring posteriorly in the right upper lobe.  Musculoskeletal: Considerable degenerative glenohumeral arthropathy bilaterally. Thoracic spondylosis. Bilateral gynecomastia. CT ABDOMEN PELVIS FINDINGS Hepatobiliary: Cholecystectomy.  Otherwise unremarkable. Pancreas: Unremarkable Spleen: Upper normal splenic size. Adrenals/Urinary Tract: Stable fullness of the left adrenal gland. Bilateral percutaneous nephrostomies. Stable right mid kidney cyst. Stable mild renal atrophy. Urinary bladder appears grossly abnormal, with irregular wall thickening, irregular folds, internal gas density, complex septation anteriorly, internal air-fluid levels, and expansion of an anterior diverticular like structure herniating out in between the thickened rectus abdominus muscles. Surrounding indistinctness of fat planes potentially from inflammation or tumor. Poor definition of the interface between the prostate gland and the urinary bladder. Stomach/Bowel: Small duodenal diverticula. Prominence of stool in the rectum. Transverse colostomy in the upper abdomen, new compared to the prior exam. Proximal sigmoid colon wall thickening adjacent to the urinary bladder. Vascular/Lymphatic: Aortoiliac atherosclerotic vascular disease. No individually definable pathologically enlarged lymph nodes. Reproductive: Suspected prior transurethral resection of the prostate with a large defect in the upper prostate near the border with the urinary bladder. Enhancing margins along the urinary bladder margin. Other: No supplemental non-categorized findings. Musculoskeletal: Right hip screw. Bony demineralization. Severe degenerative left hip arthropathy. Deformity in the right proximal femur from prior fracture. Lumbar spondylosis. IMPRESSION: 1. Continued appearance of markedly irregular urinary bladder with irregular wall thickening, internal folds, internal air-fluid levels, and severe surrounding inflammatory findings effacing surrounding fat planes. There was previously an anterior diverticular  type outpouching through the presumed tumor or inflammatory thickening in the anterior bladder, this is expanded and is now gas-filled and extends as a hernia in between the increasingly thickened inferior rectus musculature in into the subcutaneous space as on image 108/2. This is in contiguity with the urinary bladder, and could represent an unusual diverticulum of the bladder, or simply breakdown of local tissues. Infection is certainly not excluded. Residual malignancy likewise is not excluded given this complex appearance. 2.  There is thickening of the proximal sigmoid colon in the left lower quadrant adjacent to the urinary bladder which could be from secondary inflammation, or possibly treatment related effects if the patient has had prior radiation therapy in this region. 3. Other imaging findings of potential clinical significance: Aortic Atherosclerosis (ICD10-I70.0). Coronary atherosclerosis. Esophageal dysmotility versus reflux. Small bilateral pleural effusions. Remote asbestos exposure. Severe glenohumeral and hip arthropathy bilaterally. Bilateral gynecomastia. Bilateral percutaneous nephrostomies without hydronephrosis. New transverse colostomy in the upper abdomen without complicating feature. Prior TURP. Bony demineralization. Electronically Signed   By: Van Clines M.D.   On: 08/08/2019 10:25   Ct Abdomen Pelvis W Contrast  Result Date: 08/08/2019 CLINICAL DATA:  Restaging metastatic bladder cancer. History of prostate cancer. EXAM: CT CHEST, ABDOMEN, AND PELVIS WITH CONTRAST TECHNIQUE: Multidetector CT imaging of the chest, abdomen and pelvis was performed following the standard protocol during bolus administration of intravenous contrast. CONTRAST:  175mL OMNIPAQUE IOHEXOL 300 MG/ML  SOLN COMPARISON:  Multiple exams, including 06/06/2019 FINDINGS: CT CHEST FINDINGS Cardiovascular: Coronary, aortic arch, and branch vessel atherosclerotic vascular disease. Ectatic ascending thoracic  aorta at 3.8 cm. Mediastinum/Nodes: Contrast medium in the distal esophagus possibly from dysmotility or reflux. Lungs/Pleura: Small bilateral pleural effusions. 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 Stable scarring posteriorly in the right upper lobe. Musculoskeletal: Considerable degenerative glenohumeral arthropathy bilaterally. Thoracic spondylosis. Bilateral gynecomastia. CT ABDOMEN PELVIS FINDINGS Hepatobiliary: Cholecystectomy.  Otherwise unremarkable. Pancreas: Unremarkable Spleen: Upper normal splenic size. Adrenals/Urinary Tract: Stable fullness of the left adrenal gland. Bilateral percutaneous nephrostomies. Stable right mid kidney cyst. Stable mild renal atrophy. Urinary bladder appears grossly abnormal, with irregular wall thickening, irregular folds, internal gas density, complex septation anteriorly, internal air-fluid levels, and expansion of an anterior diverticular like structure herniating out in between the thickened rectus abdominus muscles. Surrounding indistinctness of fat planes potentially from inflammation or tumor. Poor definition of the interface between the prostate gland and the urinary bladder. Stomach/Bowel: Small duodenal diverticula. Prominence of stool in the rectum. Transverse colostomy in the upper abdomen, new compared to the prior exam. Proximal sigmoid colon wall thickening adjacent to the urinary bladder. Vascular/Lymphatic: Aortoiliac atherosclerotic vascular disease. No individually definable pathologically enlarged lymph nodes. Reproductive: Suspected prior transurethral resection of the prostate with a large defect in the upper prostate near the border with the urinary bladder. Enhancing margins along the urinary bladder margin. Other: No supplemental non-categorized findings. Musculoskeletal: Right hip screw. Bony demineralization. Severe degenerative left hip arthropathy. Deformity in the right proximal femur from prior fracture.  Lumbar spondylosis. IMPRESSION: 1. Continued appearance of markedly irregular urinary bladder with irregular wall thickening, internal folds, internal air-fluid levels, and severe surrounding inflammatory findings effacing surrounding fat planes. There was previously an anterior diverticular type outpouching through the presumed tumor or inflammatory thickening in the anterior bladder, this is expanded and is now gas-filled and extends as a hernia in between the increasingly thickened inferior rectus musculature in into the subcutaneous space as on image 108/2. This is in contiguity with the urinary bladder, and could represent an unusual diverticulum of the bladder, or simply breakdown of local tissues. Infection is certainly not excluded. Residual malignancy likewise is not excluded given this complex appearance. 2. There is thickening of the proximal sigmoid colon in the left lower quadrant adjacent to the urinary bladder which could be from secondary inflammation, or possibly treatment related effects if the patient has had prior radiation therapy in this region. 3. Other imaging findings of potential clinical significance: Aortic  Atherosclerosis (ICD10-I70.0). Coronary atherosclerosis. Esophageal dysmotility versus reflux. Small bilateral pleural effusions. Remote asbestos exposure. Severe glenohumeral and hip arthropathy bilaterally. Bilateral gynecomastia. Bilateral percutaneous nephrostomies without hydronephrosis. New transverse colostomy in the upper abdomen without complicating feature. Prior TURP. Bony demineralization. Electronically Signed   By: Van Clines M.D.   On: 08/08/2019 10:25     Assessment and plan- Patient is a 84 y.o. male with locally progressive metastatic bladder cancer with colon involvement s/p diverting colostomy and bilateral nephrostomy tubes in place.  He is here for on treatment assessment prior to cycle 2-day 1 of Padcev  Counts okay to proceed with cycle 2-day 1 of  Padcev today.  He will directly proceed with cycle 2-day 8 of Padcev next week and I will see him back in 3 weeks with CBC with differential, CMP for cycle 3-day 1  There is significant induration and redness seen in the lower abdominal wall which may be local cellulitis but I would like to rule out any abdominal deep-seated process like an abscess or worsening malignancy.  I will therefore proceed with getting a CT abdomen at this time.  I will also start him on Augmentin twice daily for 7 days for abdominal wall cellulitis.  Patient's wife knows to call if she has any questions or concerns   Visit Diagnosis 1. Cellulitis of other specified site   2. Urothelial cancer ([place])   3. Encounter for antineoplastic chemotherapy      Dr. Randa Evens, MD, MPH [facility] [phone] 09/01/2019 12:32 PM

## 2019-09-06 ENCOUNTER — Inpatient Hospital Stay: Payer: Medicare Other

## 2019-09-06 ENCOUNTER — Other Ambulatory Visit: Payer: Self-pay

## 2019-09-06 ENCOUNTER — Ambulatory Visit: Payer: Medicare Other

## 2019-09-06 VITALS — BP 126/72 | HR 80 | Temp 96.3°F | Wt 142.1 lb

## 2019-09-06 DIAGNOSIS — Z95828 Presence of other vascular implants and grafts: Secondary | ICD-10-CM

## 2019-09-06 DIAGNOSIS — C689 Malignant neoplasm of urinary organ, unspecified: Secondary | ICD-10-CM | POA: Diagnosis not present

## 2019-09-06 DIAGNOSIS — C679 Malignant neoplasm of bladder, unspecified: Secondary | ICD-10-CM

## 2019-09-06 LAB — COMPREHENSIVE METABOLIC PANEL
ALT: 23 U/L (ref 0–44)
AST: 28 U/L (ref 15–41)
Albumin: 2.7 g/dL — ABNORMAL LOW (ref 3.5–5.0)
Alkaline Phosphatase: 84 U/L (ref 38–126)
Anion gap: 9 (ref 5–15)
BUN: 18 mg/dL (ref 8–23)
CO2: 25 mmol/L (ref 22–32)
Calcium: 8.7 mg/dL — ABNORMAL LOW (ref 8.9–10.3)
Chloride: 103 mmol/L (ref 98–111)
Creatinine, Ser: 1.07 mg/dL (ref 0.61–1.24)
GFR calc Af Amer: >60 mL/min (ref >60)
GFR calc non Af Amer: >60 mL/min (ref >60)
Glucose, Bld: 133 mg/dL — ABNORMAL HIGH (ref 70–99)
Potassium: 3.6 mmol/L (ref 3.5–5.1)
Sodium: 137 mmol/L (ref 135–145)
Total Bilirubin: 0.2 mg/dL — ABNORMAL LOW (ref 0.3–1.2)
Total Protein: 6.5 g/dL (ref 6.5–8.1)

## 2019-09-06 LAB — CBC WITH DIFFERENTIAL/PLATELET
Abs Immature Granulocytes: 0.4 10*3/uL — ABNORMAL HIGH (ref 0.00–0.07)
Basophils Absolute: 0.1 10*3/uL (ref 0.0–0.1)
Basophils Relative: 1 %
Eosinophils Absolute: 0.3 10*3/uL (ref 0.0–0.5)
Eosinophils Relative: 1 %
HCT: 37.2 % — ABNORMAL LOW (ref 39.0–52.0)
Hemoglobin: 11.4 g/dL — ABNORMAL LOW (ref 13.0–17.0)
Immature Granulocytes: 2 %
Lymphocytes Relative: 14 %
Lymphs Abs: 3.6 10*3/uL (ref 0.7–4.0)
MCH: 27.6 pg (ref 26.0–34.0)
MCHC: 30.6 g/dL (ref 30.0–36.0)
MCV: 90.1 fL (ref 80.0–100.0)
Monocytes Absolute: 1.5 10*3/uL — ABNORMAL HIGH (ref 0.1–1.0)
Monocytes Relative: 6 %
Neutro Abs: 20.2 10*3/uL — ABNORMAL HIGH (ref 1.7–7.7)
Neutrophils Relative %: 76 %
Platelets: 369 10*3/uL (ref 150–400)
RBC: 4.13 MIL/uL — ABNORMAL LOW (ref 4.22–5.81)
RDW: 17.3 % — ABNORMAL HIGH (ref 11.5–15.5)
WBC: 26.1 10*3/uL — ABNORMAL HIGH (ref 4.0–10.5)
nRBC: 0 % (ref 0.0–0.2)

## 2019-09-06 MED ORDER — HEPARIN SOD (PORK) LOCK FLUSH 100 UNIT/ML IV SOLN
500.0000 [IU] | Freq: Once | INTRAVENOUS | Status: AC | PRN
  Administered 2019-09-06: 500 [IU]
  Filled 2019-09-06: qty 5

## 2019-09-06 MED ORDER — SODIUM CHLORIDE 0.9 % IV SOLN
0.9400 mg/kg | Freq: Once | INTRAVENOUS | Status: AC
  Administered 2019-09-06: 60 mg via INTRAVENOUS
  Filled 2019-09-06: qty 6

## 2019-09-06 MED ORDER — DEXAMETHASONE SODIUM PHOSPHATE 10 MG/ML IJ SOLN
10.0000 mg | Freq: Once | INTRAMUSCULAR | Status: AC
  Administered 2019-09-06: 10 mg via INTRAVENOUS
  Filled 2019-09-06: qty 1

## 2019-09-06 MED ORDER — SODIUM CHLORIDE 0.9% FLUSH
10.0000 mL | Freq: Once | INTRAVENOUS | Status: AC
  Administered 2019-09-06: 10 mL via INTRAVENOUS
  Filled 2019-09-06: qty 10

## 2019-09-06 MED ORDER — PALONOSETRON HCL INJECTION 0.25 MG/5ML
0.2500 mg | Freq: Once | INTRAVENOUS | Status: AC
  Administered 2019-09-06: 0.25 mg via INTRAVENOUS
  Filled 2019-09-06: qty 5

## 2019-09-06 MED ORDER — SODIUM CHLORIDE 0.9 % IV SOLN
Freq: Once | INTRAVENOUS | Status: AC
  Administered 2019-09-06: 11:00:00 via INTRAVENOUS
  Filled 2019-09-06: qty 250

## 2019-09-06 MED ORDER — SODIUM CHLORIDE 0.9 % IV SOLN: 10.0000 mg | Freq: Once | INTRAVENOUS | Status: DC

## 2019-09-09 ENCOUNTER — Other Ambulatory Visit: Payer: Self-pay

## 2019-09-09 ENCOUNTER — Ambulatory Visit
Admission: RE | Admit: 2019-09-09 | Discharge: 2019-09-09 | Disposition: A | Discharge status: Home / Self Care | Payer: Medicare Other | Source: Ambulatory Visit | Attending: Oncology | Admitting: Oncology

## 2019-09-09 DIAGNOSIS — Z8546 Personal history of malignant neoplasm of prostate: Secondary | ICD-10-CM | POA: Diagnosis not present

## 2019-09-09 DIAGNOSIS — L03818 Cellulitis of other sites: Secondary | ICD-10-CM

## 2019-09-09 DIAGNOSIS — C689 Malignant neoplasm of urinary organ, unspecified: Secondary | ICD-10-CM

## 2019-09-09 DIAGNOSIS — I700 Atherosclerosis of aorta: Secondary | ICD-10-CM | POA: Diagnosis not present

## 2019-09-09 IMAGING — CT CT RENAL STONE PROTOCOL
2 of 4 series · 15 of 46 positions shown, 17 images · non-contrast
Comparison: Noncontrast CT 04/08/2019.

CLINICAL DATA: Fever. Back pain. Indwelling bilateral nephrostomy
tubes.

EXAM:
CT ABDOMEN AND PELVIS WITHOUT CONTRAST
TECHNIQUE: Multidetector CT imaging of the abdomen and pelvis was performed
following the standard protocol without IV contrast.

[Series 2: stone full standard · axial · 0.77mm/px · z∈[-1069,-609]mm · 12 of 102 slices shown, 14 images]
[im 5/102  soft-tissue]
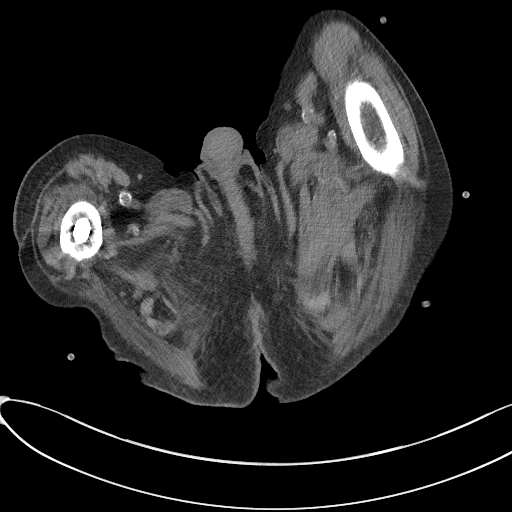
[im 5/102  bone]
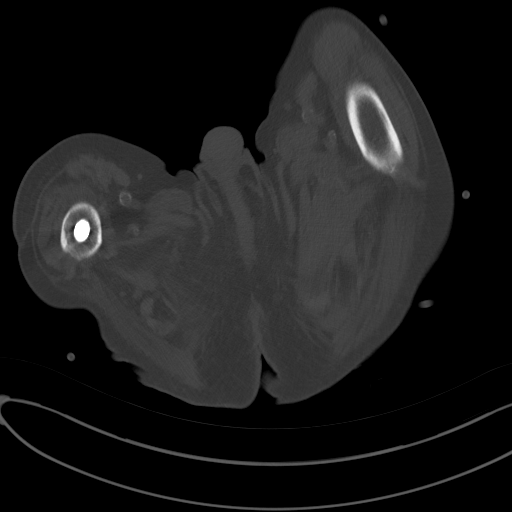
[im 14/102  soft-tissue]
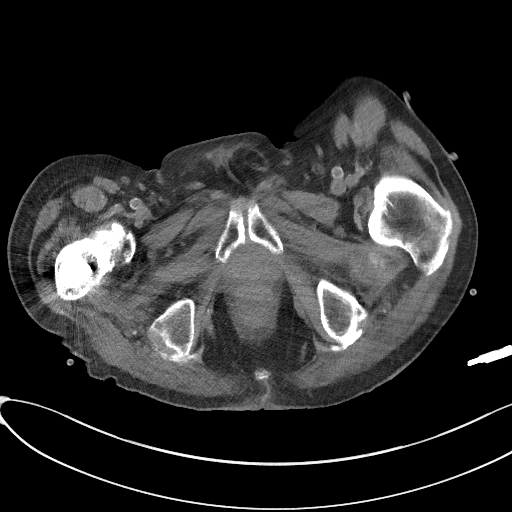
[im 22/102  soft-tissue]
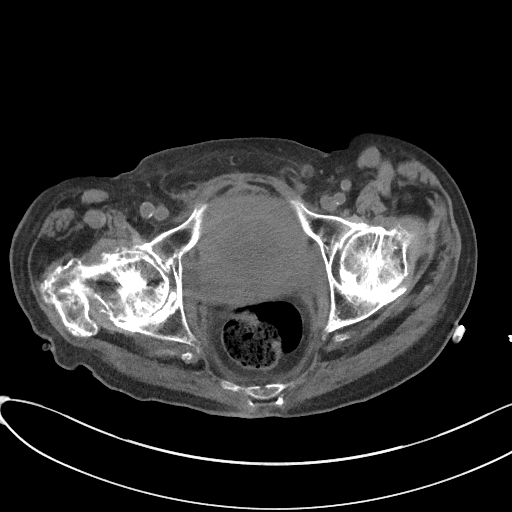
[im 31/102  soft-tissue]
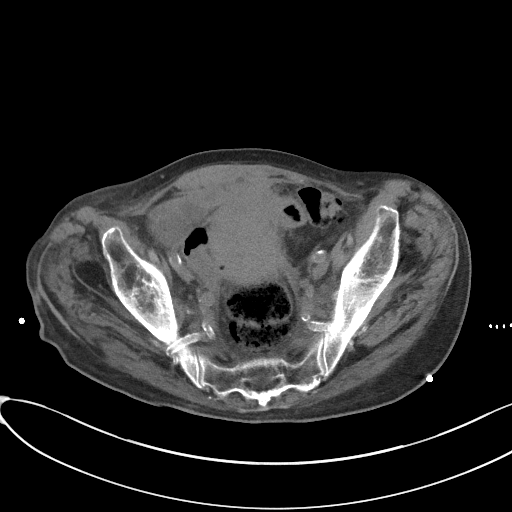
[im 40/102  soft-tissue]
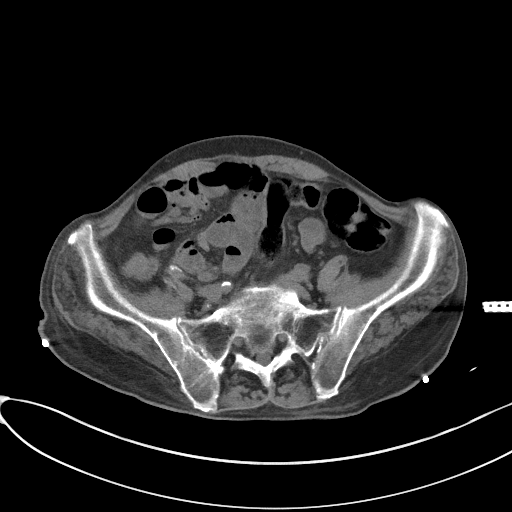
[im 49/102  soft-tissue]
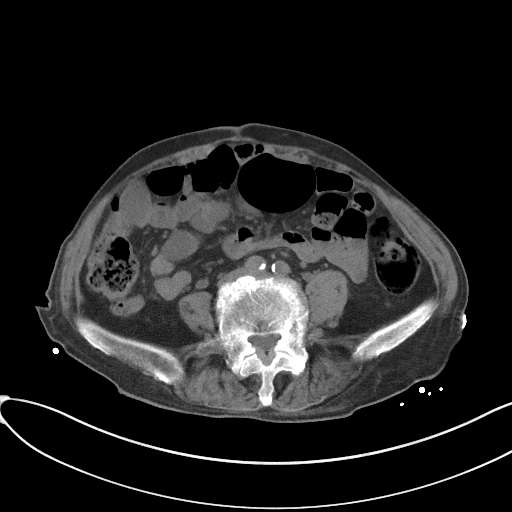
[im 53/102  soft-tissue]
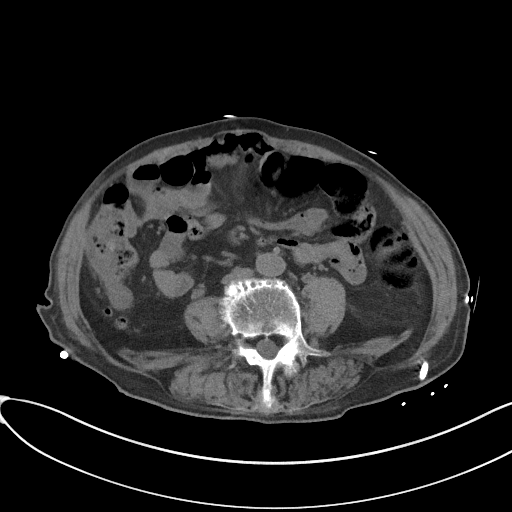
[im 62/102  soft-tissue]
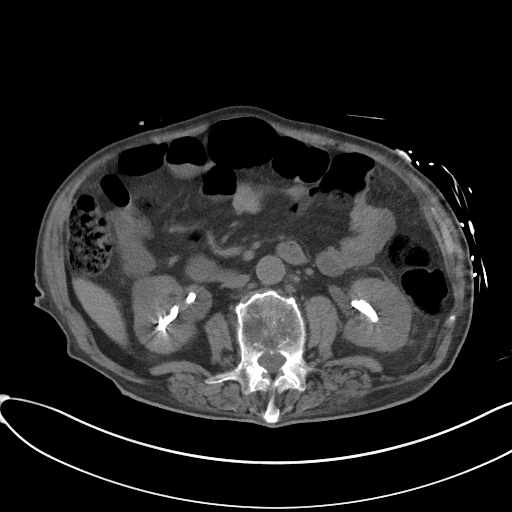
[im 71/102  soft-tissue]
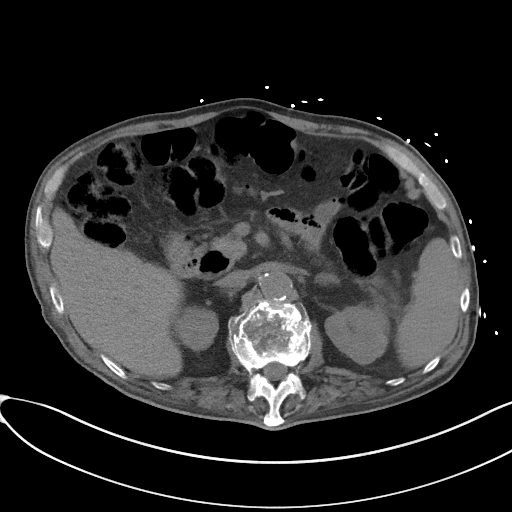
[im 71/102  bone]
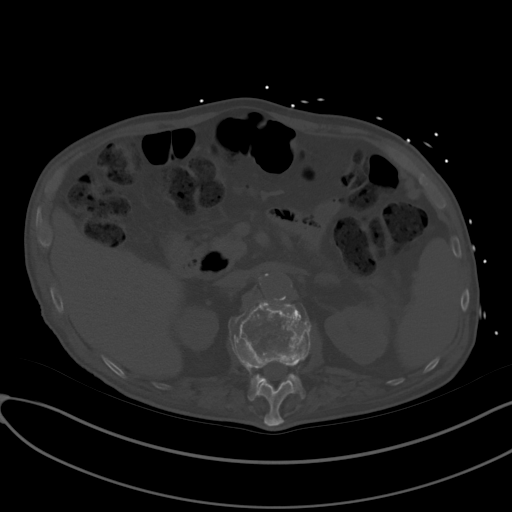
[im 80/102  soft-tissue]
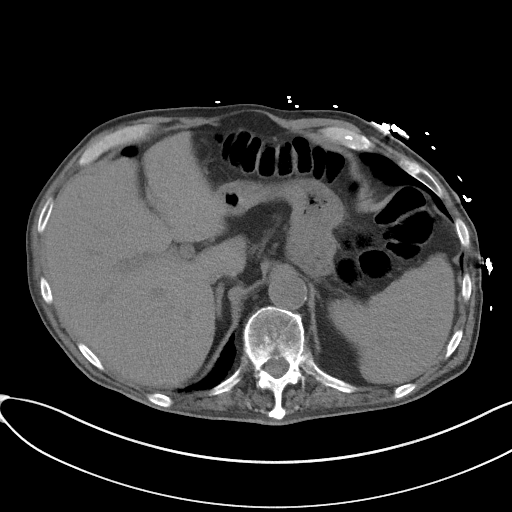
[im 88/102  soft-tissue]
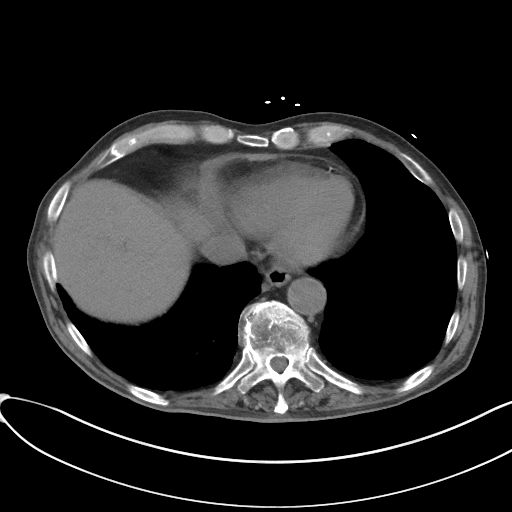
[im 97/102  soft-tissue]
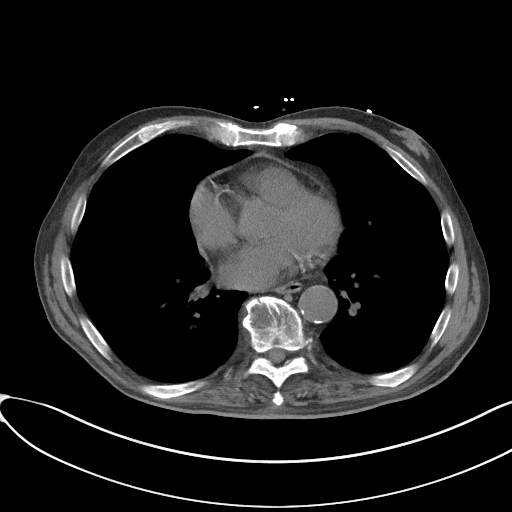

[Series 5: coronal · coronal · 0.78mm/px · 3 of 132 slices shown]
[im 44/132  soft-tissue]
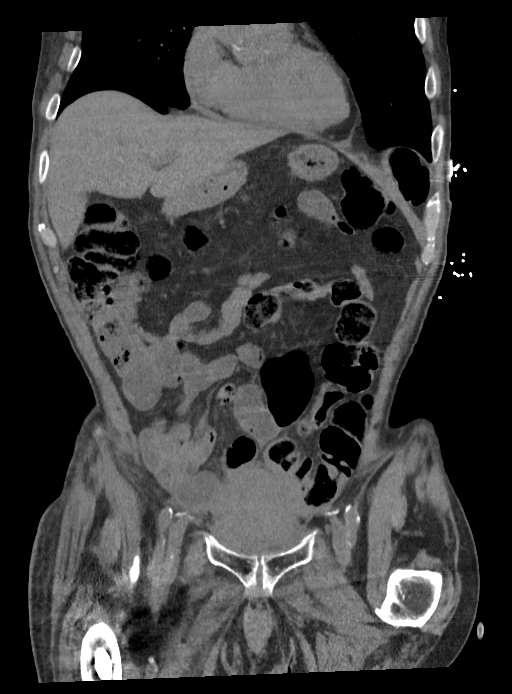
[im 59/132  soft-tissue]
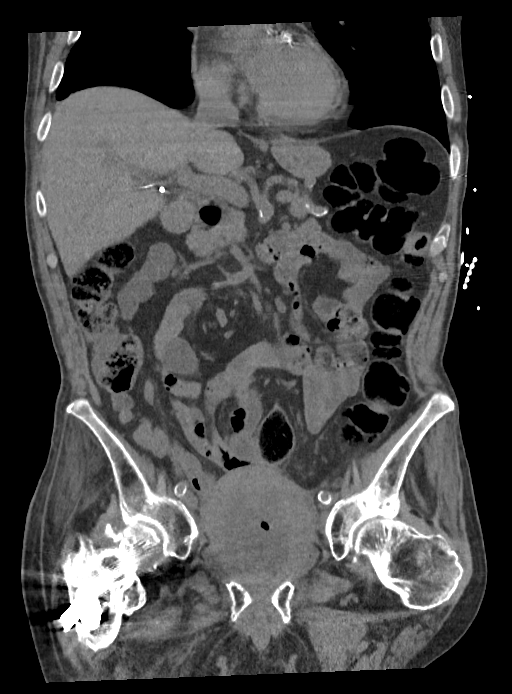
[im 73/132  soft-tissue]
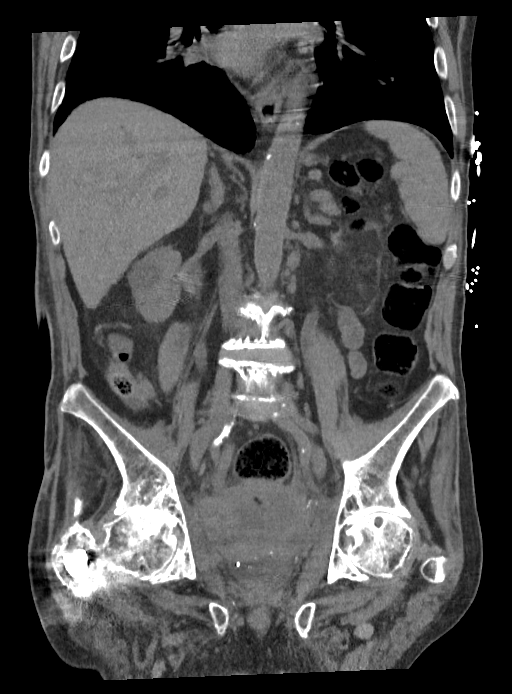

[15 of 46 positions shown; findings below may reference images not displayed]

FINDINGS: Lower chest: Pleural effusion on prior exam have resolved. Decreased
pericardial effusion, persistently measuring 11 mm adjacent to the
right heart. Coronary artery calcifications.

Hepatobiliary: No focal hepatic abnormality on noncontrast exam.
Clips in the gallbladder fossa postcholecystectomy. No biliary
dilatation.

Pancreas: Parenchymal atrophy. No ductal dilatation or inflammation.

Spleen: Normal in size without focal abnormality.

Adrenals/Urinary Tract: Similar left adrenal thickening. Normal
right adrenal gland.

Right nephrostomy tube in place, tip in the renal pelvis. No fluid
or inflammation along the nephrostomy tract. Decreased right
hydronephrosis from prior exam. Persistent dilatation of the right
renal collecting system and ureter to the level of the bladder.
Resolved right perinephric stranding from prior. Simple cyst in the
upper right kidney again seen.

Left nephrostomy tube in place, tip in the renal pelvis. No
abnormality along the nephrostomy tract. Minimal prominence of the
left renal collecting system without frank hydronephrosis. Left
ureter is prominent to the level of the bladder.

Partially necrotic bladder tumor is not significantly changed from
previous. Pelvic bowel loops again are intimately related to bladder
mass.

Stomach/Bowel: Small hiatal hernia. Stomach is decompressed.
Duodenal diverticulum without inflammation. No small bowel
obstruction. Pelvic small bowel loops are immediately related to
necrotic bladder tumor. Normal appendix. Colonic tortuosity with
moderate colonic stool burden. Stool distends the rectum. No colonic
inflammation.

Vascular/Lymphatic: Advanced aortic atherosclerosis. Small
retroperitoneal lymph nodes all subcentimeter short axis. No
definite pelvic adenopathy, lack of contrast limits detailed
assessment.

Reproductive: Stable prostate involvement by large bladder lesion.

Other: Mild presacral edema, unchanged. No free fluid. No free air.
Tiny fat containing umbilical hernia.

Musculoskeletal: Diffuse bony under mineralization. Multilevel
degenerative change throughout the spine. Advanced degenerative
change of both hips. Intramedullary rod with trans trochanteric
screw partially included.
IMPRESSION: 1. Bilateral nephrostomy tubes in place. Mild right hydronephrosis
which is improved from most recent CT. Slight prominence of the left
renal collecting system which has slightly increased.
2. Partially necrotic bladder tumor, grossly unchanged from prior.
3. Resolved right pleural effusion. Decreased pericardial effusion
from prior.
4.  Aortic Atherosclerosis ([id number]).

## 2019-09-09 MED ORDER — IOHEXOL 300 MG/ML  SOLN
100.0000 mL | Freq: Once | INTRAMUSCULAR | Status: AC | PRN
  Administered 2019-09-09: 100 mL via INTRAVENOUS

## 2019-09-10 NOTE — Progress Notes (Signed)
I called and spoke to wife of pt and they will come on Friday- they have biliary tube change at 8:30 and then will come over and see [place] after that. Wife states they have to be there at hospital at 8:30 then they will numb his locally and then exchange tubes. I gave her my number to call me to see when it will be done.

## 2019-09-10 NOTE — Progress Notes (Signed)
Nephrostomy tubes exchange

## 2019-09-13 ENCOUNTER — Ambulatory Visit
Admission: RE | Admit: 2019-09-13 | Discharge: 2019-09-13 | Disposition: A | Discharge status: Home / Self Care | Payer: Medicare Other | Source: Ambulatory Visit | Attending: Urology | Admitting: Urology

## 2019-09-13 ENCOUNTER — Inpatient Hospital Stay: Payer: Medicare Other | Attending: Oncology | Admitting: Oncology

## 2019-09-13 ENCOUNTER — Encounter: Payer: Self-pay | Admitting: Interventional Radiology

## 2019-09-13 ENCOUNTER — Other Ambulatory Visit: Payer: Self-pay

## 2019-09-13 VITALS — BP 119/74 | HR 104 | Temp 98.4°F | Resp 16

## 2019-09-13 DIAGNOSIS — Z8546 Personal history of malignant neoplasm of prostate: Secondary | ICD-10-CM | POA: Insufficient documentation

## 2019-09-13 DIAGNOSIS — Z933 Colostomy status: Secondary | ICD-10-CM | POA: Diagnosis not present

## 2019-09-13 DIAGNOSIS — K219 Gastro-esophageal reflux disease without esophagitis: Secondary | ICD-10-CM | POA: Insufficient documentation

## 2019-09-13 DIAGNOSIS — Z7901 Long term (current) use of anticoagulants: Secondary | ICD-10-CM | POA: Diagnosis not present

## 2019-09-13 DIAGNOSIS — Z436 Encounter for attention to other artificial openings of urinary tract: Secondary | ICD-10-CM | POA: Diagnosis not present

## 2019-09-13 DIAGNOSIS — Z923 Personal history of irradiation: Secondary | ICD-10-CM | POA: Diagnosis not present

## 2019-09-13 DIAGNOSIS — N1339 Other hydronephrosis: Secondary | ICD-10-CM

## 2019-09-13 DIAGNOSIS — I82401 Acute embolism and thrombosis of unspecified deep veins of right lower extremity: Secondary | ICD-10-CM | POA: Insufficient documentation

## 2019-09-13 DIAGNOSIS — Z7189 Other specified counseling: Secondary | ICD-10-CM | POA: Diagnosis not present

## 2019-09-13 DIAGNOSIS — C689 Malignant neoplasm of urinary organ, unspecified: Secondary | ICD-10-CM

## 2019-09-13 DIAGNOSIS — R5381 Other malaise: Secondary | ICD-10-CM | POA: Diagnosis not present

## 2019-09-13 DIAGNOSIS — Z5112 Encounter for antineoplastic immunotherapy: Secondary | ICD-10-CM | POA: Insufficient documentation

## 2019-09-13 DIAGNOSIS — I10 Essential (primary) hypertension: Secondary | ICD-10-CM | POA: Diagnosis not present

## 2019-09-13 DIAGNOSIS — Z79899 Other long term (current) drug therapy: Secondary | ICD-10-CM | POA: Insufficient documentation

## 2019-09-13 DIAGNOSIS — C679 Malignant neoplasm of bladder, unspecified: Secondary | ICD-10-CM | POA: Insufficient documentation

## 2019-09-13 DIAGNOSIS — Z87440 Personal history of urinary (tract) infections: Secondary | ICD-10-CM | POA: Insufficient documentation

## 2019-09-13 DIAGNOSIS — R5383 Other fatigue: Secondary | ICD-10-CM | POA: Diagnosis not present

## 2019-09-13 DIAGNOSIS — D72829 Elevated white blood cell count, unspecified: Secondary | ICD-10-CM | POA: Insufficient documentation

## 2019-09-13 HISTORY — PX: IR NEPHROSTOMY EXCHANGE LEFT: IMG6069

## 2019-09-13 HISTORY — PX: IR NEPHROSTOMY EXCHANGE RIGHT: IMG6070

## 2019-09-13 MED ORDER — IODIXANOL 320 MG/ML IV SOLN
50.0000 mL | Freq: Once | INTRAVENOUS | Status: AC | PRN
  Administered 2019-09-13: 20 mL

## 2019-09-13 NOTE — Progress Notes (Signed)
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 For his prostate cancer he has received IM RT in the past. He has been seeing Dr. Erlene Quan in the past for his recurrent UTIs as well as hematuria. CT scan in July 2018 showed bladder wall thickening with perivascular edema and inflammation in the right ureter greater than the left. Findings were thought to be due to pyelonephritis at that time. He underwent cystoscopy on 12/14/2017 which showed abnormal looking prostate with necrotic material lining the entire surface area. Diffuse copious debris within the bladder appearing to be erythematous without discrete bladder tumor but visualization was poor. He was then admitted to the hospital on 12/26/2017 with symptoms of UTI and sepsis as well as new moderate bilateral hydronephrosis.  CT abdomen and pelvis with contrast on 12/20/2017 again showed market bile bladder wall thickening with perivesicular edema. Within the lumen of the bladder there is a 93.9 cm soft tissue attenuating filling defect. This is indeterminate and could represent an area of blood clot versus urothelial lesion.  He underwent repeat cystoscopy with bilateral pyelogram and ureteral stent placement as well as TURBT and TURP. He was found to have a massive tumor involving the majority of the bladder with grossly necrotic and calcified material. There appeared to be multifocal disease with large burden of the left  anterior bladder wall extending posteriorly as well as adjacent to the bladder neck and beyond the right hemitrigone. Very little normal recognizable bladder mucosa remaining.   Biopsy from TURBT and TURP showed: High-grade urothelial carcinoma with extensive necrosisinvolving both the bladder and the prostate.CT chest did not reveal any evidence of metastatic disease. He was also not found to have any regional adenopathy on CT abdomen  Dr. Erlene Quan performed interval TURBT on 01/15/18 and was able to debulk tumor as much as possible to reduce tumor burden  Patient received 5 cycles ofcarboplatin/ gemcitabine 2 weeks on and 1 week off ending 04/26/18.Patient did receive radiation for 10 days during cycle 2 of treatment. Given patients age, co-morbidities and frailty- he was not a cisplatin candidate.6th cycle not given due to fall and hip fracture  Patient had a repeat cystoscopy in September 2019 which showed no obvious tumor bladder but it did have a shaggy necrotic appearance at the bladder neck. Prostatic fossa was grossly abnormal necrotic and irregular without papillary change. Bladder neck was biopsied and showed residual high-grade urothelial carcinoma. Muscularis propria was not seen in that specimen.Due to evidence of recurrent/residual disease patient was started on Tecentriq on 07/26/2018  Patient noted to have local progression of his bladder cancer causing bowel obstruction in July 2020. He is status post diverting colostomy. He also has bilateral nephrostomies in place.FGFR mutation testing was negative. Third line padcevstarted after performance status showed some improvement    Interval history- patient here with wife today. Wife ruth reports patient is doing well. He is able to get himself out of his wheelchair and ambulate with a walker. Appetite is good. Currently complains of pain because of change in nephrostomy tubes.  ECOG PS- 2 Pain scale-  3 Opioid associated constipation- no  Review of systems- Review of Systems  Constitutional: Positive for malaise/fatigue. Negative for chills, fever and weight loss.  HENT: Negative for congestion, ear discharge and nosebleeds.   Eyes: Negative for blurred vision.  Respiratory: Negative for cough, hemoptysis, sputum production, shortness of breath and wheezing.   Cardiovascular: Negative for chest pain, palpitations, orthopnea and claudication.  Gastrointestinal: Negative for abdominal pain, blood in stool, constipation, diarrhea, heartburn, melena, nausea and vomiting.  Genitourinary: Negative for dysuria, flank pain, frequency, hematuria and urgency.  Musculoskeletal: Negative for back pain, joint pain and myalgias.  Skin: Negative for rash.  Neurological: Negative for dizziness, tingling, focal weakness, seizures, weakness and headaches.  Endo/Heme/Allergies: Does not bruise/bleed easily.  Psychiatric/Behavioral: Negative for depression and suicidal ideas. The patient does not have insomnia.       No Known Allergies   Past Medical History:  Diagnosis Date   Anxiety    Arthritis    Bladder cancer ([place])    Dysrhythmia 07/2018   history of atrial flutter that worsens with anxiety   Femur fracture, right ([place]) 05/01/2018   GERD (gastroesophageal reflux disease)    History of recent blood transfusion 05/2018   Hypertension    Iron deficiency anemia 09/14/2018   Prostate cancer ([place]) 07/2018   cancer growing in prostate but not prostate cancer, it is from the bladder   Umbilical hernia 83/3825   Urinary retention 2019   foley catheter place 11/2017   UTI (urinary tract infection) 2019   frequent UTI's over last year   Wound eschar of foot 07/2018   left heal getting wrapped and requiring antibiotic cream. cracks open with weight bearing.     Past Surgical History:  Procedure Laterality Date   CARPAL TUNNEL RELEASE Right    CHOLECYSTECTOMY  2004    COLOSTOMY N/A 06/08/2019   Procedure: COLOSTOMY;  Surgeon: Herbert Pun, MD;  Location: [facility];  Service: General;  Laterality: N/A;   CYSTOGRAM  07/18/2018   Procedure: CYSTOGRAM;  Surgeon: [name], MD;  Location: [facility];  Service: Urology;;   Consuela Mimes W/ RETROGRADES Bilateral 07/18/2018   Procedure: CYSTOSCOPY WITH RETROGRADE PYELOGRAM;  Surgeon: [name], MD;  Location: [facility];  Service: Urology;  Laterality: Bilateral;   CYSTOSCOPY W/ URETERAL STENT PLACEMENT Bilateral 12/27/2017   Procedure: CYSTOSCOPY WITH RETROGRADE PYELOGRAM/URETERAL STENT PLACEMENT;  Surgeon: [name], MD;  Location: [facility];  Service: Urology;  Laterality: Bilateral;   CYSTOSCOPY W/ URETERAL STENT PLACEMENT Bilateral 07/18/2018   Procedure: CYSTOSCOPY WITH STENT REPLACEMENT (exchange);  Surgeon: [name], MD;  Location: [facility];  Service: Urology;  Laterality: Bilateral;   CYSTOSCOPY WITH STENT PLACEMENT Bilateral 11/12/2018   Procedure: [place] WITH STENT Exchange;  Surgeon: [name], MD;  Location: [facility];  Service: Urology;  Laterality: Bilateral;   INTRAMEDULLARY (IM) NAIL INTERTROCHANTERIC Right 05/02/2018   Procedure: INTRAMEDULLARY (IM) NAIL INTERTROCHANTRIC;  Surgeon: [name], MD;  Location: [facility];  Service: Orthopedics;  Laterality: Right;   IR NEPHROSTOMY EXCHANGE LEFT  03/25/2019   IR NEPHROSTOMY EXCHANGE LEFT  04/19/2019   IR NEPHROSTOMY EXCHANGE LEFT  05/31/2019   IR NEPHROSTOMY EXCHANGE LEFT  07/19/2019   IR NEPHROSTOMY EXCHANGE LEFT  09/13/2019   IR NEPHROSTOMY EXCHANGE RIGHT  03/25/2019   IR NEPHROSTOMY EXCHANGE RIGHT  04/19/2019   IR NEPHROSTOMY EXCHANGE RIGHT  05/31/2019   IR NEPHROSTOMY EXCHANGE RIGHT  07/19/2019   IR NEPHROSTOMY EXCHANGE RIGHT  09/13/2019  IR NEPHROSTOMY PLACEMENT LEFT  02/23/2019   IR NEPHROSTOMY PLACEMENT RIGHT  02/23/2019   LAPAROTOMY N/A 06/08/2019   Procedure: EXPLORATORY LAPAROTOMY;  Surgeon:  Herbert Pun, MD;  Location: [facility];  Service: General;  Laterality: N/A;   LEG TENDON SURGERY Right 1958   PORTA CATH INSERTION N/A 01/22/2018   Procedure: PORTA CATH INSERTION;  Surgeon: [name], MD;  Location: [place] [facility];  Service: Cardiovascular;  Laterality: N/A;   TRANSURETHRAL RESECTION OF BLADDER TUMOR N/A 12/27/2017   Procedure: TRANSURETHRAL RESECTION OF BLADDER TUMOR (TURBT);  Surgeon: [name], MD;  Location: [facility];  Service: Urology;  Laterality: N/A;   TRANSURETHRAL RESECTION OF BLADDER TUMOR N/A 01/15/2018   Procedure: TRANSURETHRAL RESECTION OF BLADDER TUMOR (TURBT);  Surgeon: [name], MD;  Location: [facility];  Service: Urology;  Laterality: N/A;  Need 2 hrs for this case please   TRANSURETHRAL RESECTION OF BLADDER TUMOR N/A 07/18/2018   Procedure: TRANSURETHRAL RESECTION OF BLADDER TUMOR (TURBT);  Surgeon: [name], MD;  Location: [facility];  Service: Urology;  Laterality: N/A;    Social History   Socioeconomic History   Marital status: Married    Spouse name: ruth   Number of children: Not on file   Years of education: Not on file   Highest education level: Not on file  Occupational History   Occupation: retired    Comment: Barista strain: Patient refused   Food insecurity    Worry: Patient refused    Inability: Patient refused   Transportation needs    Medical: Patient refused    Non-medical: Patient refused  Tobacco Use   Smoking status: Never Smoker   Smokeless tobacco: Never Used  Substance and Sexual Activity   Alcohol use: No    Alcohol/week: 0.0 standard drinks   Drug use: No   Sexual activity: Not Currently  Lifestyle   Physical activity    Days per week: Patient refused    Minutes per session: Patient refused   Stress: Patient refused  Relationships   Social connections    Talks on phone: Patient refused    Gets together: Patient  refused    Attends religious service: Patient refused    Active member of club or organization: Patient refused    Attends meetings of clubs or organizations: Patient refused    Relationship status: Patient refused   Intimate partner violence    Fear of current or ex partner: Patient refused    Emotionally abused: Patient refused    Physically abused: Patient refused    Forced sexual activity: Patient refused  Other Topics Concern   Not on file  Social History Narrative   Not on file    Family History  Problem Relation Age of Onset   Cancer Mother    Chronic Renal Failure Mother    Heart disease Father      Current Outpatient Medications:    acetaminophen (TYLENOL) 500 MG tablet, Take 1,000 mg by mouth every 6 (six) hours as needed for moderate pain or headache. , Disp: , Rfl:    cetirizine (ZYRTEC) 10 MG tablet, Take 10 mg by mouth daily as needed for allergies. , Disp: , Rfl:    ciprofloxacin (CIPRO) 500 MG tablet, Take 500 mg by mouth 2 (two) times daily. 2 day before tube exchange, 2 day of tube exchange, and 2 day after tube exchange, Disp: , Rfl:    Cranberry-Cholecalciferol (SUPER CRANBERRY/VITAMIN D3) 4200-500  MG-UNIT CAPS, Take 1 capsule by mouth 2 (two) times daily., Disp: , Rfl:    feeding supplement, ENSURE ENLIVE, (ENSURE ENLIVE) LIQD, Take 237 mLs by mouth 2 (two) times daily between meals., Disp: 237 mL, Rfl: 12   ferrous sulfate 325 (65 FE) MG tablet, Take 1 tablet (325 mg total) by mouth 2 (two) times daily with a meal., Disp: , Rfl: 3   Glycerin-Hypromellose-PEG 400 (DRY EYE RELIEF DROPS OP), Apply 1 drop to eye daily., Disp: , Rfl:    lidocaine-prilocaine (EMLA) cream, Apply 1 application topically as needed., Disp: 30 g, Rfl: 1   Multiple Vitamin (MULTIVITAMIN WITH MINERALS) TABS tablet, Take 1 tablet by mouth daily., Disp: , Rfl:    MYRBETRIQ 50 MG TB24 tablet, TAKE 1 TABLET BY MOUTH DAILY, Disp: 30 tablet, Rfl: 3   nystatin (NYAMYC) powder,  Apply topically 2 (two) times daily. (Patient taking differently: Apply 1 g topically 2 (two) times daily as needed. ), Disp: 60 g, Rfl: 3   ondansetron (ZOFRAN) 8 MG tablet, Take 1 tablet (8 mg total) by mouth 2 (two) times daily as needed for refractory nausea / vomiting. Start on day 3 after chemo., Disp: 30 tablet, Rfl: 1   oxyCODONE (OXY IR/ROXICODONE) 5 MG immediate release tablet, Take 1 tablet (5 mg total) by mouth every 4 (four) hours as needed for severe pain., Disp: 60 tablet, Rfl: 0   polyethylene glycol powder (MIRALAX) powder, Take 17 g by mouth daily as needed. Can increase to 3 times a day as needed for constipation but hold medication if has diarrhea, Disp: 255 g, Rfl: 0   prochlorperazine (COMPAZINE) 10 MG tablet, Take 1 tablet (10 mg total) by mouth every 6 (six) hours as needed (Nausea or vomiting)., Disp: 30 tablet, Rfl: 1   vitamin B-12 (CYANOCOBALAMIN) 500 MCG tablet, Take 500 mcg by mouth daily., Disp: , Rfl:    zinc oxide 20 % ointment, Apply 1 application topically as needed for irritation., Disp: , Rfl:    dexamethasone (DECADRON) 4 MG tablet, Take 2 tablets (8 mg total) by mouth daily. Start the day after chemotherapy for 2 days. (Patient not taking: Reported on 09/13/2019), Disp: 30 tablet, Rfl: 1  Physical exam:  Vitals:   09/13/19 1107  BP: 119/74  Pulse: (!) 104  Resp: 16  Temp: 98.4 F (36.9 C)  TempSrc: Temporal  SpO2: 100%   Physical Exam Constitutional:      Comments: Frail elderly tremulous gentleman sitting in a wheelchair  HENT:     Head: Normocephalic and atraumatic.  Eyes:     Pupils: Pupils are equal, round, and reactive to light.  Neck:     Musculoskeletal: Normal range of motion.  Cardiovascular:     Rate and Rhythm: Normal rate and regular rhythm.     Heart sounds: Normal heart sounds.  Pulmonary:     Effort: Pulmonary effort is normal.     Breath sounds: Normal breath sounds.  Abdominal:     General: Bowel sounds are normal.      Palpations: Abdomen is soft.     Comments: Central colostomy in place. B/l nephrostomy tubes in place. There is a firm swelling with overlying redness noted over lower abdominal wall.   Skin:    General: Skin is warm and dry.  Neurological:     Mental Status: He is alert and oriented to person, place, and time.      CMP Latest Ref Rng & Units 09/06/2019  Glucose 70 -  99 mg/dL 133(H)  BUN 8 - 23 mg/dL 18  Creatinine 0.61 - 1.24 mg/dL 1.07  Sodium 135 - 145 mmol/L 137  Potassium 3.5 - 5.1 mmol/L 3.6  Chloride 98 - 111 mmol/L 103  CO2 22 - 32 mmol/L 25  Calcium 8.9 - 10.3 mg/dL 8.7(L)  Total Protein 6.5 - 8.1 g/dL 6.5  Total Bilirubin 0.3 - 1.2 mg/dL 0.2(L)  Alkaline Phos 38 - 126 U/L 84  AST 15 - 41 U/L 28  ALT 0 - 44 U/L 23   CBC Latest Ref Rng & Units 09/06/2019  WBC 4.0 - 10.5 K/uL 26.1(H)  Hemoglobin 13.0 - 17.0 g/dL 11.4(L)  Hematocrit 39.0 - 52.0 % 37.2(L)  Platelets 150 - 400 K/uL 369    No images are attached to the encounter.  Ct Abdomen Pelvis W Contrast  Result Date: 09/09/2019 CLINICAL DATA:  Restaging prostate cancer and urothelial carcinoma diagnosed 1 year ago. History of bilateral nephrostomies, partial colon resection and cholecystectomy. Reported area of erythema and swelling in the abdominal wall. EXAM: CT ABDOMEN AND PELVIS WITH CONTRAST TECHNIQUE: Multidetector CT imaging of the abdomen and pelvis was performed using the standard protocol following bolus administration of intravenous contrast. CONTRAST:  132mL OMNIPAQUE IOHEXOL 300 MG/ML  SOLN COMPARISON:  CT 08/08/2019 and 06/06/2019. FINDINGS: Lower chest: Stable mild emphysematous changes and scarring at both lung bases. The small right pleural effusion noted previously has resolved. Hepatobiliary: The liver is normal in density without suspicious focal abnormality. No significant biliary dilatation post cholecystectomy. Pancreas: Unremarkable. No pancreatic ductal dilatation or surrounding inflammatory  changes. Spleen: Normal in size without focal abnormality. Adrenals/Urinary Tract: The adrenal glands appear stable without suspicious findings. Bilateral percutaneous nephrostomies remain in place. There is bilateral renal cortical thinning with contrast excretion into the renal collecting systems on the delayed images. There is a stable cyst anteriorly in the mid right kidney. No hydronephrosis. Grossly abnormal urinary bladder again noted with irregular bladder wall thickening and enhancement. There are solid enhancing components measuring 4.5 x 2.9 cm on image 69/2 and 4.5 x 3.0 cm on image 72/2. There are internal air-fluid levels within the bladder lumen with an enlarging diverticular like structure herniating between the rectus abdominus muscles. This now measures up to 7.7 x 7.0 cm on image 65/2 (previously 4.9 x 5.2 cm). Stomach/Bowel: No evidence of bowel wall thickening, distention or surrounding inflammatory change. There is a distal transverse colostomy. The appendix appears normal. A large amount of stool is present in the rectum. Vascular/Lymphatic: There are no enlarged abdominal or pelvic lymph nodes. Aortic and branch vessel atherosclerosis without acute vascular findings. Reproductive: Previous trans urethral resection of the prostate gland. Irregular enhancing nodularity along the bladder base, similar to previous study. Other: Diffuse soft tissue stranding and ill-defined fat planes along the pelvic sidewalls, similar to previous study. No ascites or free air. No focal inflammatory changes are identified within the anterior abdominal wall related to the colostomy or enlarging diverticulum projecting anteriorly from the bladder through the lower anterior abdominal wall. Musculoskeletal: No acute osseous findings or evidence of bone destruction. There are degenerative changes throughout the spine with ankylosis of the L1 through L3 vertebral bodies. There are moderately advanced degenerative  changes at both hip status post proximal right femoral ORIF. IMPRESSION: 1. Persistent abnormal appearance of the urinary bladder status post trans urethral resection of the prostate gland. There is persistent irregular enhancing nodularity along the bladder base, suspicious for residual/recurrent bladder cancer. 2. Enlarging diverticular-like structure extending  anteriorly from the bladder and herniating between the rectus abdominus muscles and the lower anterior abdominal wall. Again, this could be related to tumor and bladder outlet obstruction versus tissue necrosis/infection. 3. No evidence of distant metastatic disease. 4. Bilateral percutaneous nephrostomies remain in place. No hydronephrosis. 5. Aortic Atherosclerosis (ICD10-I70.0). Electronically Signed   By: Richardean Sale M.D.   On: 09/09/2019 13:29   Ir Nephrostomy Exchange Left  Result Date: 09/13/2019 CLINICAL DATA:  Bladder carcinoma. Long term indwelling bilateral nephrostomy catheters, presents for scheduled exchange. No current problems with drain catheters. EXAM: BILATERAL PERCUTANEOUS NEPHROSTOMY CATHETER EXCHANGE UNDER FLUOROSCOPY TECHNIQUE: The procedure, risks (including but not limited to bleeding, infection, organ damage ), benefits, and alternatives were explained to the patient and spouse. Questions regarding the procedure were encouraged and answered. The patient understands and consents to the procedure. The nephrostomy tubes and surrounding skin were prepped with Betadine, draped in usual sterile fashion. 1% lidocaine subcutaneous placed around both skin entry sites. A small amount of contrast was injected through the left nephrostomy catheter to opacify the renal collecting system. The catheter was cut and exchanged over a 0.035" angiographic wire for a new 12 pigtail catheter, formed centrally within the collecting system under fluoroscopy. Contrast injection confirms appropriate positioning. In a similar fashion, the right  nephrostomy catheter was injected, cut, and exchanged for a new 12 pigtail catheter, formed centrally within the right renal collecting system. Injection confirms appropriate positioning and patency. Both catheters were secured externally with a Statlock devices and 0 Prolene sutures. The patient tolerated the procedure well. FLUOROSCOPY TIME:  1.2 minute; 4 5 uGym2 DAP COMPLICATIONS: None immediate IMPRESSION: 1. Technically successful exchange of bilateral nephrostomy catheters under fluoroscopy Electronically Signed   By: Lucrezia Europe M.D.   On: 09/13/2019 09:50   Ir Nephrostomy Exchange Right  Result Date: 09/13/2019 CLINICAL DATA:  Bladder carcinoma. Long term indwelling bilateral nephrostomy catheters, presents for scheduled exchange. No current problems with drain catheters. EXAM: BILATERAL PERCUTANEOUS NEPHROSTOMY CATHETER EXCHANGE UNDER FLUOROSCOPY TECHNIQUE: The procedure, risks (including but not limited to bleeding, infection, organ damage ), benefits, and alternatives were explained to the patient and spouse. Questions regarding the procedure were encouraged and answered. The patient understands and consents to the procedure. The nephrostomy tubes and surrounding skin were prepped with Betadine, draped in usual sterile fashion. 1% lidocaine subcutaneous placed around both skin entry sites. A small amount of contrast was injected through the left nephrostomy catheter to opacify the renal collecting system. The catheter was cut and exchanged over a 0.035" angiographic wire for a new 12 pigtail catheter, formed centrally within the collecting system under fluoroscopy. Contrast injection confirms appropriate positioning. In a similar fashion, the right nephrostomy catheter was injected, cut, and exchanged for a new 12 pigtail catheter, formed centrally within the right renal collecting system. Injection confirms appropriate positioning and patency. Both catheters were secured externally with a Statlock  devices and 0 Prolene sutures. The patient tolerated the procedure well. FLUOROSCOPY TIME:  1.2 minute; 4 5 uGym2 DAP COMPLICATIONS: None immediate IMPRESSION: 1. Technically successful exchange of bilateral nephrostomy catheters under fluoroscopy Electronically Signed   By: Lucrezia Europe M.D.   On: 09/13/2019 09:50     Assessment and plan- Patient is a 84 y.o. male  with locally progressive metastatic bladder cancer with colon involvement s/p diverting colostomy and bilateral nephrostomy tubes in place.    He is here to discuss results of CT scan and further goals of care discussion  I have  reviewed CT abdomen and pelvis images independently and discussed findings with the patient and his wife.  Patient continues to have local progression of his bladder cancer.  There is anterior extension of his bladder tumor through his rectus abdominis muscle which is felt as a form palpable swelling in the lower anterior abdominal wall.  Discussed that we can continue Padcev for 2 more treatments and see if he is having any response to treatment.  There is also a concern that his ongoing progression can further result in more areas of bowel obstruction which could be potentially life-threatening.  Patient and his wife understand and would like to potentially continue treatment for now  Patient is already received radiation for his prostate in the past and was not deemed to be a good candidate for prior bladder radiation.  I did discuss with Dr. Donella Stade to see if there would be any role for radiation treatment to his anterior abdominal wall swelling which seems to be causing some pain.  He will be seeing Dr. Donella Stade sometime next week.  I will see him back in 1 week for cycle 3-day 1 of Padcev   Visit Diagnosis 1. Goals of care, counseling/discussion   2. Urothelial cancer ([place])      Dr. Randa Evens, MD, MPH [facility] [phone] 09/13/2019 2:44 PM

## 2019-09-13 NOTE — Progress Notes (Signed)
Pt added on for today to go over ct scan results.

## 2019-09-13 NOTE — Procedures (Signed)
  Procedure: Exchange bilat PCNs 70f EBL:   minimal Complications:  none immediate  See full dictation in [facility].  Dillard Cannon MD Main # [phone] Pager  [phone]

## 2019-09-20 ENCOUNTER — Encounter: Payer: Self-pay | Admitting: Radiation Oncology

## 2019-09-20 ENCOUNTER — Ambulatory Visit
Admission: RE | Admit: 2019-09-20 | Discharge: 2019-09-20 | Disposition: A | Discharge status: Home / Self Care | Payer: Medicare Other | Source: Ambulatory Visit | Attending: Radiation Oncology | Admitting: Radiation Oncology

## 2019-09-20 ENCOUNTER — Institutional Professional Consult (permissible substitution): Payer: Self-pay | Admitting: Radiation Oncology

## 2019-09-20 ENCOUNTER — Inpatient Hospital Stay: Payer: Medicare Other

## 2019-09-20 ENCOUNTER — Inpatient Hospital Stay (HOSPITAL_BASED_OUTPATIENT_CLINIC_OR_DEPARTMENT_OTHER): Payer: Medicare Other | Admitting: Oncology

## 2019-09-20 ENCOUNTER — Encounter: Payer: Self-pay | Admitting: Oncology

## 2019-09-20 ENCOUNTER — Other Ambulatory Visit: Payer: Self-pay

## 2019-09-20 ENCOUNTER — Telehealth: Payer: Self-pay | Admitting: *Deleted

## 2019-09-20 VITALS — BP 121/79 | HR 110 | Temp 94.9°F | Wt 149.0 lb

## 2019-09-20 VITALS — HR 85

## 2019-09-20 VITALS — BP 95/82 | HR 102 | Temp 94.6°F

## 2019-09-20 DIAGNOSIS — C679 Malignant neoplasm of bladder, unspecified: Secondary | ICD-10-CM

## 2019-09-20 DIAGNOSIS — Z8546 Personal history of malignant neoplasm of prostate: Secondary | ICD-10-CM | POA: Insufficient documentation

## 2019-09-20 DIAGNOSIS — C689 Malignant neoplasm of urinary organ, unspecified: Secondary | ICD-10-CM

## 2019-09-20 DIAGNOSIS — Z5111 Encounter for antineoplastic chemotherapy: Secondary | ICD-10-CM

## 2019-09-20 DIAGNOSIS — C792 Secondary malignant neoplasm of skin: Secondary | ICD-10-CM | POA: Diagnosis not present

## 2019-09-20 DIAGNOSIS — Z933 Colostomy status: Secondary | ICD-10-CM | POA: Insufficient documentation

## 2019-09-20 DIAGNOSIS — Z95828 Presence of other vascular implants and grafts: Secondary | ICD-10-CM

## 2019-09-20 LAB — CBC WITH DIFFERENTIAL/PLATELET
Abs Immature Granulocytes: 0.35 10*3/uL — ABNORMAL HIGH (ref 0.00–0.07)
Basophils Absolute: 0.2 10*3/uL — ABNORMAL HIGH (ref 0.0–0.1)
Basophils Relative: 0 %
Eosinophils Absolute: 0.3 10*3/uL (ref 0.0–0.5)
Eosinophils Relative: 1 %
HCT: 37.6 % — ABNORMAL LOW (ref 39.0–52.0)
Hemoglobin: 11.7 g/dL — ABNORMAL LOW (ref 13.0–17.0)
Immature Granulocytes: 1 %
Lymphocytes Relative: 6 %
Lymphs Abs: 2 10*3/uL (ref 0.7–4.0)
MCH: 28 pg (ref 26.0–34.0)
MCHC: 31.1 g/dL (ref 30.0–36.0)
MCV: 90 fL (ref 80.0–100.0)
Monocytes Absolute: 1.5 10*3/uL — ABNORMAL HIGH (ref 0.1–1.0)
Monocytes Relative: 4 %
Neutro Abs: 30 10*3/uL — ABNORMAL HIGH (ref 1.7–7.7)
Neutrophils Relative %: 88 %
Platelets: 353 10*3/uL (ref 150–400)
RBC: 4.18 MIL/uL — ABNORMAL LOW (ref 4.22–5.81)
RDW: 18.5 % — ABNORMAL HIGH (ref 11.5–15.5)
WBC: 34.3 10*3/uL — ABNORMAL HIGH (ref 4.0–10.5)
nRBC: 0 % (ref 0.0–0.2)

## 2019-09-20 LAB — COMPREHENSIVE METABOLIC PANEL
ALT: 10 U/L (ref 0–44)
AST: 12 U/L — ABNORMAL LOW (ref 15–41)
Albumin: 2.9 g/dL — ABNORMAL LOW (ref 3.5–5.0)
Alkaline Phosphatase: 87 U/L (ref 38–126)
Anion gap: 7 (ref 5–15)
BUN: 18 mg/dL (ref 8–23)
CO2: 23 mmol/L (ref 22–32)
Calcium: 8.3 mg/dL — ABNORMAL LOW (ref 8.9–10.3)
Chloride: 103 mmol/L (ref 98–111)
Creatinine, Ser: 0.94 mg/dL (ref 0.61–1.24)
GFR calc Af Amer: >60 mL/min (ref >60)
GFR calc non Af Amer: >60 mL/min (ref >60)
Glucose, Bld: 137 mg/dL — ABNORMAL HIGH (ref 70–99)
Potassium: 3.8 mmol/L (ref 3.5–5.1)
Sodium: 133 mmol/L — ABNORMAL LOW (ref 135–145)
Total Bilirubin: 0.4 mg/dL (ref 0.3–1.2)
Total Protein: 6.2 g/dL — ABNORMAL LOW (ref 6.5–8.1)

## 2019-09-20 MED ORDER — SODIUM CHLORIDE 0.9 % IV SOLN
60.0000 mg | Freq: Once | INTRAVENOUS | Status: AC
  Administered 2019-09-20: 60 mg via INTRAVENOUS
  Filled 2019-09-20: qty 6

## 2019-09-20 MED ORDER — HEPARIN SOD (PORK) LOCK FLUSH 100 UNIT/ML IV SOLN
500.0000 [IU] | Freq: Once | INTRAVENOUS | Status: AC | PRN
  Administered 2019-09-20: 500 [IU]
  Filled 2019-09-20: qty 5

## 2019-09-20 MED ORDER — PALONOSETRON HCL INJECTION 0.25 MG/5ML
0.2500 mg | Freq: Once | INTRAVENOUS | Status: AC
  Administered 2019-09-20: 0.25 mg via INTRAVENOUS
  Filled 2019-09-20: qty 5

## 2019-09-20 MED ORDER — SODIUM CHLORIDE 0.9% FLUSH
10.0000 mL | Freq: Once | INTRAVENOUS | Status: AC
  Administered 2019-09-20: 10 mL via INTRAVENOUS
  Filled 2019-09-20: qty 10

## 2019-09-20 MED ORDER — DEXAMETHASONE SODIUM PHOSPHATE 10 MG/ML IJ SOLN
10.0000 mg | Freq: Once | INTRAMUSCULAR | Status: AC
  Administered 2019-09-20: 10 mg via INTRAVENOUS
  Filled 2019-09-20: qty 1

## 2019-09-20 MED ORDER — SODIUM CHLORIDE 0.9 % IV SOLN
Freq: Once | INTRAVENOUS | Status: AC
  Administered 2019-09-20: 12:00:00 via INTRAVENOUS
  Filled 2019-09-20: qty 250

## 2019-09-20 NOTE — Telephone Encounter (Signed)
I called ruth to ask her if they were able to come in at 10 instead of 11 am . We were suppose to set pt up for radiation appt. I sent the message last Friday and it auto deleted over the weekend and I did not realize that he did not get an appt. Then I was looking at the schedule and labs and saw that he did not have radiation appt. I called and got one but the only time they had was 10 am. I wrote in on asticky and it was covered up with another sticky and by the time I saw it last night it was 10 pm and it was too late to call them so I called at 8:15 this morning. [name] says she will call her son and wake him up and get pt. Ready . She feels that they can do it.

## 2019-09-20 NOTE — Progress Notes (Signed)
Radiation Oncology Follow up Note old patient new area  Name: Vincent Poole   Date:   09/20/2019 MRN:  [id number] DOB: May 14, 1935    This 84 y.o. male presents to the clinic today for reevaluation of metastatic disease into the abdominal wall from his known stage IV urothelial carcinoma for evaluation of further palliative treatment.  REFERRING PROVIDER: [name], MD  HPI: Patient is a 84 year old male who initially received palliative radiation therapy to his bladder for urethral carcinoma.  Had been treated proximally 8 years prior with external beam radiation therapy for adenocarcinoma the prostate.  Patient has had.  Debulking initially of his bladder tumor high-grade urothelial carcinoma with extensive necrosis involving both the bladder and prostate.  He received chemotherapy virus 5 cycles of carboplatinum gemcitabine along with radiation to his bladder back in 2019.  He went on to develop bowel obstruction in July 2020 status post diverting colostomy he also has bilateral nephrostomy tubes placed.  He recently complained of increasing abdominal pain and discomfort CT scan shows progression of bladder cancer with anterior extension of the bladder through his rectus abdominis muscle causing erythematous change of the skin and palpable swelling in the right anterior abdominal wall.  He is currently receiving immunotherapy withPadcev.  COMPLICATIONS OF TREATMENT: none  FOLLOW UP COMPLIANCE: keeps appointments   PHYSICAL EXAM:  BP 95/82   Pulse (!) 102   Temp (!) 94.6 F (34.8 C)  Frail-appearing male in NAD.  Wheelchair-bound.  He has a poke patent colostomy bag.  There is erythematous changes in the right anterior abdominal wall and a palpable mass consistent with known tumor involvement.  Well-developed well-nourished patient in NAD. HEENT reveals PERLA, EOMI, discs not visualized.  Oral cavity is clear. No oral mucosal lesions are identified. Neck is clear without evidence  of cervical or supraclavicular adenopathy. Lungs are clear to A&P. Cardiac examination is essentially unremarkable with regular rate and rhythm without murmur rub or thrill. Abdomen is benign with no organomegaly or masses noted. Motor sensory and DTR levels are equal and symmetric in the upper and lower extremities. Cranial nerves II through XII are grossly intact. Proprioception is intact. No peripheral adenopathy or edema is identified. No motor or sensory levels are noted. Crude visual fields are within normal range.  RADIOLOGY RESULTS: CT scans reviewed compatible with above-stated findings  PLAN: At this time elected ahead with a short palliative course of radiation therapy to the abdominal wall and viable tumor by CT criteria.  Would plan on delivering 3000 cGy in 15 fractions.  Would use previous plan to estimate dose of radiation to this area to avoid any excessive side effects.  Side effects such as skin reaction fatigue possible diarrhea all were discussed in detail with the patient.  He has had a diverting colostomy so I believe his bowel issues will not be significant.  I have personally set up and ordered CT simulation.  We will possibly use IMRT treatment planning and delivery to spare previously radiated areas and be better able to deliver a palliative dose of radiation therapy.  Patient wife both comprehend my treatment plan well.  I would like to take this opportunity to thank you for allowing me to participate in the care of your patient.Noreene Filbert, MD

## 2019-09-20 NOTE — Progress Notes (Signed)
Patient stated that he had been doing well with no complaints. 

## 2019-09-23 NOTE — Progress Notes (Signed)
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 For his prostate cancer he has received IM RT in the past. He has been seeing Dr. Erlene Quan in the past for his recurrent UTIs as well as hematuria. CT scan in July 2018 showed bladder wall thickening with perivascular edema and inflammation in the right ureter greater than the left. Findings were thought to be due to pyelonephritis at that time. He underwent cystoscopy on 12/14/2017 which showed abnormal looking prostate with necrotic material lining the entire surface area. Diffuse copious debris within the bladder appearing to be erythematous without discrete bladder tumor but visualization was poor. He was then admitted to the hospital on 12/26/2017 with symptoms of UTI and sepsis as well as new moderate bilateral hydronephrosis.  CT abdomen and pelvis with contrast on 12/20/2017 again showed market bile bladder wall thickening with perivesicular edema. Within the lumen of the bladder there is a 93.9 cm soft tissue attenuating filling defect. This is indeterminate and could represent an area of blood clot versus urothelial lesion.  He underwent repeat cystoscopy with bilateral pyelogram and ureteral stent placement as well as TURBT and TURP. He was found to have a massive tumor involving the majority of the bladder with grossly necrotic and calcified material. There appeared to be multifocal disease with  large burden of the left anterior bladder wall extending posteriorly as well as adjacent to the bladder neck and beyond the right hemitrigone. Very little normal recognizable bladder mucosa remaining.   Biopsy from TURBT and TURP showed: High-grade urothelial carcinoma with extensive necrosisinvolving both the bladder and the prostate.CT chest did not reveal any evidence of metastatic disease. He was also not found to have any regional adenopathy on CT abdomen  Dr. Erlene Quan performed interval TURBT on 01/15/18 and was able to debulk tumor as much as possible to reduce tumor burden  Patient received 5 cycles ofcarboplatin/ gemcitabine 2 weeks on and 1 week off ending 04/26/18.Patient did receive radiation for 10 days during cycle 2 of treatment. Given patients age, co-morbidities and frailty- he was not a cisplatin candidate.6th cycle not given due to fall and hip fracture  Patient had a repeat cystoscopy in September 2019 which showed no obvious tumor bladder but it did have a shaggy necrotic appearance at the bladder neck. Prostatic fossa was grossly abnormal necrotic and irregular without papillary change. Bladder neck was biopsied and showed residual high-grade urothelial carcinoma. Muscularis propria was not seen in that specimen.Due to evidence of recurrent/residual disease patient was started on Tecentriq on 07/26/2018  Patient noted to have local progression of his bladder cancer causing bowel obstruction in July 2020. He is status post diverting colostomy. He also has bilateral nephrostomies in place.FGFR mutation testing was negative. Third line padcevstarted after performance status showed some improvement   Interval history-patient is here with his wife today.  Wife reports patient feels at his baseline.  He is able to walk around the house with the help of a walker.  He also holds onto the side  railings and is able to climb a few steps.  He has not had any falls.   Patient is extremely hard of hearing ECOG PS- 2 Pain scale- 0 Opioid associated constipation- no  Review of systems- Review of Systems  Constitutional: Positive for malaise/fatigue. Negative for chills, fever and weight loss.  HENT: Negative for congestion, ear discharge and nosebleeds.   Eyes: Negative for blurred vision.  Respiratory: Negative for cough, hemoptysis, sputum production, shortness of breath and wheezing.   Cardiovascular: Negative for chest pain, palpitations, orthopnea and claudication.  Gastrointestinal: Negative for abdominal pain, blood in stool, constipation, diarrhea, heartburn, melena, nausea and vomiting.  Genitourinary: Negative for dysuria, flank pain, frequency, hematuria and urgency.  Musculoskeletal: Negative for back pain, joint pain and myalgias.  Skin: Negative for rash.  Neurological: Negative for dizziness, tingling, focal weakness, seizures, weakness and headaches.  Endo/Heme/Allergies: Does not bruise/bleed easily.  Psychiatric/Behavioral: Negative for depression and suicidal ideas. The patient does not have insomnia.       No Known Allergies   Past Medical History:  Diagnosis Date   Anxiety    Arthritis    Bladder cancer ([place])    Dysrhythmia 07/2018   history of atrial flutter that worsens with anxiety   Femur fracture, right ([place]) 05/01/2018   GERD (gastroesophageal reflux disease)    History of recent blood transfusion 05/2018   Hypertension    Iron deficiency anemia 09/14/2018   Prostate cancer ([place]) 07/2018   cancer growing in prostate but not prostate cancer, it is from the bladder   Umbilical hernia 59/5638   Urinary retention 2019   foley catheter place 11/2017   UTI (urinary tract infection) 2019   frequent UTI's over last year   Wound eschar of foot 07/2018   left heal getting wrapped and requiring antibiotic cream. cracks open with weight bearing.     Past Surgical History:  Procedure Laterality Date    CARPAL TUNNEL RELEASE Right    CHOLECYSTECTOMY  2004   COLOSTOMY N/A 06/08/2019   Procedure: COLOSTOMY;  Surgeon: Herbert Pun, MD;  Location: [facility];  Service: General;  Laterality: N/A;   CYSTOGRAM  07/18/2018   Procedure: CYSTOGRAM;  Surgeon: [name], MD;  Location: [facility];  Service: Urology;;   Consuela Mimes W/ RETROGRADES Bilateral 07/18/2018   Procedure: CYSTOSCOPY WITH RETROGRADE PYELOGRAM;  Surgeon: [name], MD;  Location: [facility];  Service: Urology;  Laterality: Bilateral;   CYSTOSCOPY W/ URETERAL STENT PLACEMENT Bilateral 12/27/2017   Procedure: CYSTOSCOPY WITH RETROGRADE PYELOGRAM/URETERAL STENT PLACEMENT;  Surgeon: [name], MD;  Location: [facility];  Service: Urology;  Laterality: Bilateral;   CYSTOSCOPY W/ URETERAL STENT PLACEMENT Bilateral 07/18/2018   Procedure: CYSTOSCOPY WITH STENT REPLACEMENT (exchange);  Surgeon: [name], MD;  Location: [facility];  Service: Urology;  Laterality: Bilateral;   CYSTOSCOPY WITH STENT PLACEMENT Bilateral 11/12/2018   Procedure: [place] WITH STENT Exchange;  Surgeon: [name], MD;  Location: [facility];  Service: Urology;  Laterality: Bilateral;   INTRAMEDULLARY (IM) NAIL INTERTROCHANTERIC Right 05/02/2018   Procedure: INTRAMEDULLARY (IM) NAIL INTERTROCHANTRIC;  Surgeon: [name], MD;  Location: [facility];  Service: Orthopedics;  Laterality: Right;   IR NEPHROSTOMY EXCHANGE LEFT  03/25/2019   IR NEPHROSTOMY EXCHANGE LEFT  04/19/2019   IR NEPHROSTOMY EXCHANGE LEFT  05/31/2019   IR NEPHROSTOMY EXCHANGE LEFT  07/19/2019   IR NEPHROSTOMY EXCHANGE LEFT  09/13/2019   IR NEPHROSTOMY EXCHANGE RIGHT  03/25/2019   IR NEPHROSTOMY EXCHANGE RIGHT  04/19/2019   IR  NEPHROSTOMY EXCHANGE RIGHT  05/31/2019   IR NEPHROSTOMY EXCHANGE RIGHT  07/19/2019   IR NEPHROSTOMY EXCHANGE RIGHT  09/13/2019   IR NEPHROSTOMY PLACEMENT LEFT  02/23/2019   IR NEPHROSTOMY PLACEMENT RIGHT  02/23/2019   LAPAROTOMY N/A 06/08/2019     Procedure: EXPLORATORY LAPAROTOMY;  Surgeon: Herbert Pun, MD;  Location: [facility];  Service: General;  Laterality: N/A;   LEG TENDON SURGERY Right 1958   PORTA CATH INSERTION N/A 01/22/2018   Procedure: PORTA CATH INSERTION;  Surgeon: [name], MD;  Location: [facility];  Service: Cardiovascular;  Laterality: N/A;   TRANSURETHRAL RESECTION OF BLADDER TUMOR N/A 12/27/2017   Procedure: TRANSURETHRAL RESECTION OF BLADDER TUMOR (TURBT);  Surgeon: [name], MD;  Location: [facility];  Service: Urology;  Laterality: N/A;   TRANSURETHRAL RESECTION OF BLADDER TUMOR N/A 01/15/2018   Procedure: TRANSURETHRAL RESECTION OF BLADDER TUMOR (TURBT);  Surgeon: [name], MD;  Location: [facility];  Service: Urology;  Laterality: N/A;  Need 2 hrs for this case please   TRANSURETHRAL RESECTION OF BLADDER TUMOR N/A 07/18/2018   Procedure: TRANSURETHRAL RESECTION OF BLADDER TUMOR (TURBT);  Surgeon: [name], MD;  Location: [facility];  Service: Urology;  Laterality: N/A;    Social History   Socioeconomic History   Marital status: Married    Spouse name: ruth   Number of children: Not on file   Years of education: Not on file   Highest education level: Not on file  Occupational History   Occupation: retired    Comment: Barista strain: Patient refused   Food insecurity    Worry: Patient refused    Inability: Patient refused   Transportation needs    Medical: Patient refused    Non-medical: Patient refused  Tobacco Use   Smoking status: Never Smoker   Smokeless tobacco: Never Used  Substance and Sexual Activity   Alcohol use: No    Alcohol/week: 0.0 standard drinks   Drug use: No   Sexual activity: Not Currently  Lifestyle   Physical activity    Days per week: Patient refused    Minutes per session: Patient refused   Stress: Patient refused  Relationships   Social connections    Talks on phone:  Patient refused    Gets together: Patient refused    Attends religious service: Patient refused    Active member of club or organization: Patient refused    Attends meetings of clubs or organizations: Patient refused    Relationship status: Patient refused   Intimate partner violence    Fear of current or ex partner: Patient refused    Emotionally abused: Patient refused    Physically abused: Patient refused    Forced sexual activity: Patient refused  Other Topics Concern   Not on file  Social History Narrative   Not on file    Family History  Problem Relation Age of Onset   Cancer Mother    Chronic Renal Failure Mother    Heart disease Father      Current Outpatient Medications:    acetaminophen (TYLENOL) 500 MG tablet, Take 1,000 mg by mouth every 6 (six) hours as needed for moderate pain or headache. , Disp: , Rfl:    cetirizine (ZYRTEC) 10 MG tablet, Take 10 mg by mouth daily as needed for allergies. , Disp: , Rfl:    Cranberry-Cholecalciferol (SUPER CRANBERRY/VITAMIN D3) 4200-500 MG-UNIT CAPS, Take 1 capsule by mouth 2 (two) times daily., Disp: ,  Rfl:    dexamethasone (DECADRON) 4 MG tablet, Take 2 tablets (8 mg total) by mouth daily. Start the day after chemotherapy for 2 days., Disp: 30 tablet, Rfl: 1   feeding supplement, ENSURE ENLIVE, (ENSURE ENLIVE) LIQD, Take 237 mLs by mouth 2 (two) times daily between meals., Disp: 237 mL, Rfl: 12   ferrous sulfate 325 (65 FE) MG tablet, Take 1 tablet (325 mg total) by mouth 2 (two) times daily with a meal., Disp: , Rfl: 3   Glycerin-Hypromellose-PEG 400 (DRY EYE RELIEF DROPS OP), Apply 1 drop to eye daily., Disp: , Rfl:    lidocaine-prilocaine (EMLA) cream, Apply 1 application topically as needed., Disp: 30 g, Rfl: 1   Multiple Vitamin (MULTIVITAMIN WITH MINERALS) TABS tablet, Take 1 tablet by mouth daily., Disp: , Rfl:    MYRBETRIQ 50 MG TB24 tablet, TAKE 1 TABLET BY MOUTH DAILY, Disp: 30 tablet, Rfl: 3   nystatin  (NYAMYC) powder, Apply topically 2 (two) times daily. (Patient taking differently: Apply 1 g topically 2 (two) times daily as needed. ), Disp: 60 g, Rfl: 3   ondansetron (ZOFRAN) 8 MG tablet, Take 1 tablet (8 mg total) by mouth 2 (two) times daily as needed for refractory nausea / vomiting. Start on day 3 after chemo., Disp: 30 tablet, Rfl: 1   oxyCODONE (OXY IR/ROXICODONE) 5 MG immediate release tablet, Take 1 tablet (5 mg total) by mouth every 4 (four) hours as needed for severe pain., Disp: 60 tablet, Rfl: 0   polyethylene glycol powder (MIRALAX) powder, Take 17 g by mouth daily as needed. Can increase to 3 times a day as needed for constipation but hold medication if has diarrhea, Disp: 255 g, Rfl: 0   prochlorperazine (COMPAZINE) 10 MG tablet, Take 1 tablet (10 mg total) by mouth every 6 (six) hours as needed (Nausea or vomiting)., Disp: 30 tablet, Rfl: 1   vitamin B-12 (CYANOCOBALAMIN) 500 MCG tablet, Take 500 mcg by mouth daily., Disp: , Rfl:    zinc oxide 20 % ointment, Apply 1 application topically as needed for irritation., Disp: , Rfl:   Physical exam:  Vitals:   09/20/19 1111  BP: 121/79  Pulse: (!) 110  Temp: (!) 94.9 F (34.9 C)  TempSrc: Tympanic  Weight: 149 lb (67.6 kg)   Physical Exam Constitutional:      Comments: Elderly frail tremulous gentleman sitting in a wheelchair  HENT:     Head: Normocephalic and atraumatic.  Eyes:     Pupils: Pupils are equal, round, and reactive to light.  Neck:     Musculoskeletal: Normal range of motion.  Cardiovascular:     Rate and Rhythm: Normal rate and regular rhythm.     Heart sounds: Normal heart sounds.  Pulmonary:     Effort: Pulmonary effort is normal.     Breath sounds: Normal breath sounds.  Abdominal:     General: Bowel sounds are normal.     Palpations: Abdomen is soft.     Comments: Bilateral nephrostomy tubes in place.  Midline diverting colostomy in place.  Firm palpable mass felt in the suprapubic area with  overlying mild erythema  Skin:    General: Skin is warm and dry.  Neurological:     Mental Status: He is alert and oriented to person, place, and time.      CMP Latest Ref Rng & Units 09/20/2019  Glucose 70 - 99 mg/dL 137(H)  BUN 8 - 23 mg/dL 18  Creatinine 0.61 - 1.24 mg/dL 0.94  Sodium 135 - 145 mmol/L 133(L)  Potassium 3.5 - 5.1 mmol/L 3.8  Chloride 98 - 111 mmol/L 103  CO2 22 - 32 mmol/L 23  Calcium 8.9 - 10.3 mg/dL 8.3(L)  Total Protein 6.5 - 8.1 g/dL 6.2(L)  Total Bilirubin 0.3 - 1.2 mg/dL 0.4  Alkaline Phos 38 - 126 U/L 87  AST 15 - 41 U/L 12(L)  ALT 0 - 44 U/L 10   CBC Latest Ref Rng & Units 09/20/2019  WBC 4.0 - 10.5 K/uL 34.3(H)  Hemoglobin 13.0 - 17.0 g/dL 11.7(L)  Hematocrit 39.0 - 52.0 % 37.6(L)  Platelets 150 - 400 K/uL 353    No images are attached to the encounter.  Ct Abdomen Pelvis W Contrast  Result Date: 09/09/2019 CLINICAL DATA:  Restaging prostate cancer and urothelial carcinoma diagnosed 1 year ago. History of bilateral nephrostomies, partial colon resection and cholecystectomy. Reported area of erythema and swelling in the abdominal wall. EXAM: CT ABDOMEN AND PELVIS WITH CONTRAST TECHNIQUE: Multidetector CT imaging of the abdomen and pelvis was performed using the standard protocol following bolus administration of intravenous contrast. CONTRAST:  140mL OMNIPAQUE IOHEXOL 300 MG/ML  SOLN COMPARISON:  CT 08/08/2019 and 06/06/2019. FINDINGS: Lower chest: Stable mild emphysematous changes and scarring at both lung bases. The small right pleural effusion noted previously has resolved. Hepatobiliary: The liver is normal in density without suspicious focal abnormality. No significant biliary dilatation post cholecystectomy. Pancreas: Unremarkable. No pancreatic ductal dilatation or surrounding inflammatory changes. Spleen: Normal in size without focal abnormality. Adrenals/Urinary Tract: The adrenal glands appear stable without suspicious findings. Bilateral  percutaneous nephrostomies remain in place. There is bilateral renal cortical thinning with contrast excretion into the renal collecting systems on the delayed images. There is a stable cyst anteriorly in the mid right kidney. No hydronephrosis. Grossly abnormal urinary bladder again noted with irregular bladder wall thickening and enhancement. There are solid enhancing components measuring 4.5 x 2.9 cm on image 69/2 and 4.5 x 3.0 cm on image 72/2. There are internal air-fluid levels within the bladder lumen with an enlarging diverticular like structure herniating between the rectus abdominus muscles. This now measures up to 7.7 x 7.0 cm on image 65/2 (previously 4.9 x 5.2 cm). Stomach/Bowel: No evidence of bowel wall thickening, distention or surrounding inflammatory change. There is a distal transverse colostomy. The appendix appears normal. A large amount of stool is present in the rectum. Vascular/Lymphatic: There are no enlarged abdominal or pelvic lymph nodes. Aortic and branch vessel atherosclerosis without acute vascular findings. Reproductive: Previous trans urethral resection of the prostate gland. Irregular enhancing nodularity along the bladder base, similar to previous study. Other: Diffuse soft tissue stranding and ill-defined fat planes along the pelvic sidewalls, similar to previous study. No ascites or free air. No focal inflammatory changes are identified within the anterior abdominal wall related to the colostomy or enlarging diverticulum projecting anteriorly from the bladder through the lower anterior abdominal wall. Musculoskeletal: No acute osseous findings or evidence of bone destruction. There are degenerative changes throughout the spine with ankylosis of the L1 through L3 vertebral bodies. There are moderately advanced degenerative changes at both hip status post proximal right femoral ORIF. IMPRESSION: 1. Persistent abnormal appearance of the urinary bladder status post trans urethral  resection of the prostate gland. There is persistent irregular enhancing nodularity along the bladder base, suspicious for residual/recurrent bladder cancer. 2. Enlarging diverticular-like structure extending anteriorly from the bladder and herniating between the rectus abdominus muscles and the lower anterior abdominal wall. Again,  this could be related to tumor and bladder outlet obstruction versus tissue necrosis/infection. 3. No evidence of distant metastatic disease. 4. Bilateral percutaneous nephrostomies remain in place. No hydronephrosis. 5. Aortic Atherosclerosis (ICD10-I70.0). Electronically Signed   By: Richardean Sale M.D.   On: 09/09/2019 13:29   Ir Nephrostomy Exchange Left  Result Date: 09/13/2019 CLINICAL DATA:  Bladder carcinoma. Long term indwelling bilateral nephrostomy catheters, presents for scheduled exchange. No current problems with drain catheters. EXAM: BILATERAL PERCUTANEOUS NEPHROSTOMY CATHETER EXCHANGE UNDER FLUOROSCOPY TECHNIQUE: The procedure, risks (including but not limited to bleeding, infection, organ damage ), benefits, and alternatives were explained to the patient and spouse. Questions regarding the procedure were encouraged and answered. The patient understands and consents to the procedure. The nephrostomy tubes and surrounding skin were prepped with Betadine, draped in usual sterile fashion. 1% lidocaine subcutaneous placed around both skin entry sites. A small amount of contrast was injected through the left nephrostomy catheter to opacify the renal collecting system. The catheter was cut and exchanged over a 0.035" angiographic wire for a new 12 pigtail catheter, formed centrally within the collecting system under fluoroscopy. Contrast injection confirms appropriate positioning. In a similar fashion, the right nephrostomy catheter was injected, cut, and exchanged for a new 12 pigtail catheter, formed centrally within the right renal collecting system. Injection confirms  appropriate positioning and patency. Both catheters were secured externally with a Statlock devices and 0 Prolene sutures. The patient tolerated the procedure well. FLUOROSCOPY TIME:  1.2 minute; 4 5 uGym2 DAP COMPLICATIONS: None immediate IMPRESSION: 1. Technically successful exchange of bilateral nephrostomy catheters under fluoroscopy Electronically Signed   By: Lucrezia Europe M.D.   On: 09/13/2019 09:50   Ir Nephrostomy Exchange Right  Result Date: 09/13/2019 CLINICAL DATA:  Bladder carcinoma. Long term indwelling bilateral nephrostomy catheters, presents for scheduled exchange. No current problems with drain catheters. EXAM: BILATERAL PERCUTANEOUS NEPHROSTOMY CATHETER EXCHANGE UNDER FLUOROSCOPY TECHNIQUE: The procedure, risks (including but not limited to bleeding, infection, organ damage ), benefits, and alternatives were explained to the patient and spouse. Questions regarding the procedure were encouraged and answered. The patient understands and consents to the procedure. The nephrostomy tubes and surrounding skin were prepped with Betadine, draped in usual sterile fashion. 1% lidocaine subcutaneous placed around both skin entry sites. A small amount of contrast was injected through the left nephrostomy catheter to opacify the renal collecting system. The catheter was cut and exchanged over a 0.035" angiographic wire for a new 12 pigtail catheter, formed centrally within the collecting system under fluoroscopy. Contrast injection confirms appropriate positioning. In a similar fashion, the right nephrostomy catheter was injected, cut, and exchanged for a new 12 pigtail catheter, formed centrally within the right renal collecting system. Injection confirms appropriate positioning and patency. Both catheters were secured externally with a Statlock devices and 0 Prolene sutures. The patient tolerated the procedure well. FLUOROSCOPY TIME:  1.2 minute; 4 5 uGym2 DAP COMPLICATIONS: None immediate IMPRESSION: 1.  Technically successful exchange of bilateral nephrostomy catheters under fluoroscopy Electronically Signed   By: Lucrezia Europe M.D.   On: 09/13/2019 09:50     Assessment and plan- Patient is a 84 y.o. male with locally progressive metastatic bladder cancer with colon involvement s/p diverting colostomy and bilateral nephrostomy tubes in place.  He is here for on treatment assessment prior to cycle 3-day 1 of Padcev  Counts okay to proceed with cycle 3-day 1 of Padcev today.  He will directly proceed for cycle 1 day 8 next week.  Following that he is starting his radiation treatment and I will therefore give him a break for 2 weeks and see him back in 4 weeks with CBC with differential, CMP for cycle 4-day 1 of Padcev. He is receiving chemotherapy at a lower dose at 1 mg per metered square  Patient has baseline leukocytosis likely secondary to his malignancy.  He has undergone infectious work-up in the past that was unrevealing.  Right lower extremity DVT: He is on Eliquis for the same   Visit Diagnosis 1. Urothelial cancer ([place])   2. Encounter for antineoplastic chemotherapy      Dr. Randa Evens, MD, MPH [facility] [phone] 09/23/2019 8:25 AM

## 2019-09-24 ENCOUNTER — Other Ambulatory Visit: Payer: Self-pay

## 2019-09-24 ENCOUNTER — Ambulatory Visit
Admission: RE | Admit: 2019-09-24 | Discharge: 2019-09-24 | Disposition: A | Discharge status: Home / Self Care | Payer: Medicare Other | Source: Ambulatory Visit | Attending: Radiation Oncology | Admitting: Radiation Oncology

## 2019-09-24 DIAGNOSIS — C679 Malignant neoplasm of bladder, unspecified: Secondary | ICD-10-CM | POA: Insufficient documentation

## 2019-09-24 DIAGNOSIS — A419 Sepsis, unspecified organism: Secondary | ICD-10-CM | POA: Diagnosis not present

## 2019-09-24 DIAGNOSIS — T83512A Infection and inflammatory reaction due to nephrostomy catheter, initial encounter: Secondary | ICD-10-CM | POA: Diagnosis not present

## 2019-09-24 DIAGNOSIS — C799 Secondary malignant neoplasm of unspecified site: Secondary | ICD-10-CM | POA: Insufficient documentation

## 2019-09-25 ENCOUNTER — Other Ambulatory Visit: Payer: Self-pay

## 2019-09-25 ENCOUNTER — Encounter: Payer: Self-pay | Admitting: Emergency Medicine

## 2019-09-25 ENCOUNTER — Inpatient Hospital Stay
Admission: EM | Admit: 2019-09-25 | Discharge: 2019-09-28 | DRG: 698 | Disposition: A | Discharge status: Home Health | Payer: Medicare Other | Attending: Internal Medicine | Admitting: Internal Medicine

## 2019-09-25 ENCOUNTER — Emergency Department: Payer: Medicare Other

## 2019-09-25 DIAGNOSIS — G9340 Encephalopathy, unspecified: Secondary | ICD-10-CM | POA: Diagnosis present

## 2019-09-25 DIAGNOSIS — Z9049 Acquired absence of other specified parts of digestive tract: Secondary | ICD-10-CM

## 2019-09-25 DIAGNOSIS — N390 Urinary tract infection, site not specified: Secondary | ICD-10-CM | POA: Diagnosis present

## 2019-09-25 DIAGNOSIS — Z933 Colostomy status: Secondary | ICD-10-CM | POA: Diagnosis not present

## 2019-09-25 DIAGNOSIS — Z905 Acquired absence of kidney: Secondary | ICD-10-CM

## 2019-09-25 DIAGNOSIS — Z86718 Personal history of other venous thrombosis and embolism: Secondary | ICD-10-CM

## 2019-09-25 DIAGNOSIS — I10 Essential (primary) hypertension: Secondary | ICD-10-CM | POA: Diagnosis present

## 2019-09-25 DIAGNOSIS — Z66 Do not resuscitate: Secondary | ICD-10-CM | POA: Diagnosis present

## 2019-09-25 DIAGNOSIS — R6520 Severe sepsis without septic shock: Secondary | ICD-10-CM | POA: Diagnosis not present

## 2019-09-25 DIAGNOSIS — K219 Gastro-esophageal reflux disease without esophagitis: Secondary | ICD-10-CM | POA: Diagnosis present

## 2019-09-25 DIAGNOSIS — C61 Malignant neoplasm of prostate: Secondary | ICD-10-CM | POA: Diagnosis present

## 2019-09-25 DIAGNOSIS — A4101 Sepsis due to Methicillin susceptible Staphylococcus aureus: Secondary | ICD-10-CM | POA: Diagnosis not present

## 2019-09-25 DIAGNOSIS — A419 Sepsis, unspecified organism: Secondary | ICD-10-CM | POA: Diagnosis present

## 2019-09-25 DIAGNOSIS — Z8249 Family history of ischemic heart disease and other diseases of the circulatory system: Secondary | ICD-10-CM

## 2019-09-25 DIAGNOSIS — Z20828 Contact with and (suspected) exposure to other viral communicable diseases: Secondary | ICD-10-CM | POA: Diagnosis present

## 2019-09-25 DIAGNOSIS — D65 Disseminated intravascular coagulation [defibrination syndrome]: Secondary | ICD-10-CM

## 2019-09-25 DIAGNOSIS — T83512A Infection and inflammatory reaction due to nephrostomy catheter, initial encounter: Principal | ICD-10-CM | POA: Diagnosis present

## 2019-09-25 DIAGNOSIS — M1990 Unspecified osteoarthritis, unspecified site: Secondary | ICD-10-CM | POA: Diagnosis present

## 2019-09-25 DIAGNOSIS — C679 Malignant neoplasm of bladder, unspecified: Secondary | ICD-10-CM | POA: Diagnosis present

## 2019-09-25 DIAGNOSIS — Z809 Family history of malignant neoplasm, unspecified: Secondary | ICD-10-CM

## 2019-09-25 DIAGNOSIS — Z841 Family history of disorders of kidney and ureter: Secondary | ICD-10-CM

## 2019-09-25 LAB — COMPREHENSIVE METABOLIC PANEL
ALT: 12 U/L (ref 0–44)
AST: 21 U/L (ref 15–41)
Albumin: 2.8 g/dL — ABNORMAL LOW (ref 3.5–5.0)
Alkaline Phosphatase: 105 U/L (ref 38–126)
Anion gap: 13 (ref 5–15)
BUN: 22 mg/dL (ref 8–23)
CO2: 21 mmol/L — ABNORMAL LOW (ref 22–32)
Calcium: 8.5 mg/dL — ABNORMAL LOW (ref 8.9–10.3)
Chloride: 101 mmol/L (ref 98–111)
Creatinine, Ser: 1.07 mg/dL (ref 0.61–1.24)
GFR calc Af Amer: >60 mL/min (ref >60)
GFR calc non Af Amer: >60 mL/min (ref >60)
Glucose, Bld: 190 mg/dL — ABNORMAL HIGH (ref 70–99)
Potassium: 3.9 mmol/L (ref 3.5–5.1)
Sodium: 135 mmol/L (ref 135–145)
Total Bilirubin: 0.6 mg/dL (ref 0.3–1.2)
Total Protein: 6.1 g/dL — ABNORMAL LOW (ref 6.5–8.1)

## 2019-09-25 LAB — PROTIME-INR
INR: 1.1 (ref 0.8–1.2)
Prothrombin Time: 14 seconds (ref 11.4–15.2)

## 2019-09-25 LAB — URINALYSIS, COMPLETE (UACMP) WITH MICROSCOPIC
Bilirubin Urine: NEGATIVE
Glucose, UA: NEGATIVE mg/dL
Hgb urine dipstick: NEGATIVE
Ketones, ur: NEGATIVE mg/dL
Nitrite: POSITIVE — AB
Protein, ur: 30 mg/dL — AB
Specific Gravity, Urine: 1.031 — ABNORMAL HIGH (ref 1.005–1.030)
Squamous Epithelial / HPF: NONE SEEN (ref 0–5)
WBC, UA: >50 WBC/hpf — ABNORMAL HIGH (ref 0–5)
pH: 5 (ref 5.0–8.0)

## 2019-09-25 LAB — CBC WITH DIFFERENTIAL/PLATELET
Abs Immature Granulocytes: 1.08 10*3/uL — ABNORMAL HIGH (ref 0.00–0.07)
Basophils Absolute: 0.2 10*3/uL — ABNORMAL HIGH (ref 0.0–0.1)
Basophils Relative: 0 %
Eosinophils Absolute: 0.5 10*3/uL (ref 0.0–0.5)
Eosinophils Relative: 1 %
HCT: 38.6 % — ABNORMAL LOW (ref 39.0–52.0)
Hemoglobin: 12.6 g/dL — ABNORMAL LOW (ref 13.0–17.0)
Immature Granulocytes: 2 %
Lymphocytes Relative: 8 %
Lymphs Abs: 3.6 10*3/uL (ref 0.7–4.0)
MCH: 28.2 pg (ref 26.0–34.0)
MCHC: 32.6 g/dL (ref 30.0–36.0)
MCV: 86.4 fL (ref 80.0–100.0)
Monocytes Absolute: 2.3 10*3/uL — ABNORMAL HIGH (ref 0.1–1.0)
Monocytes Relative: 5 %
Neutro Abs: 39.2 10*3/uL — ABNORMAL HIGH (ref 1.7–7.7)
Neutrophils Relative %: 84 %
Platelets: 314 10*3/uL (ref 150–400)
RBC: 4.47 MIL/uL (ref 4.22–5.81)
RDW: 19.3 % — ABNORMAL HIGH (ref 11.5–15.5)
Smear Review: NORMAL
WBC: 46.9 10*3/uL — ABNORMAL HIGH (ref 4.0–10.5)
nRBC: 0 % (ref 0.0–0.2)

## 2019-09-25 LAB — LACTIC ACID, PLASMA
Lactic Acid, Venous: 5 mmol/L (ref 0.5–1.9)
Lactic Acid, Venous: 1 mmol/L (ref 0.5–1.9)

## 2019-09-25 LAB — PROCALCITONIN: Procalcitonin: 0.33 ng/mL

## 2019-09-25 MED ORDER — ACETAMINOPHEN 650 MG RE SUPP: 650.0000 mg | Freq: Four times a day (QID) | RECTAL | Status: DC | PRN

## 2019-09-25 MED ORDER — SODIUM CHLORIDE 0.9 % IV SOLN
2.0000 g | Freq: Once | INTRAVENOUS | Status: AC
  Administered 2019-09-25: 21:00:00 2 g via INTRAVENOUS
  Filled 2019-09-25: qty 2

## 2019-09-25 MED ORDER — VANCOMYCIN HCL IN DEXTROSE 1-5 GM/200ML-% IV SOLN: 1000.0000 mg | Freq: Once | INTRAVENOUS | Status: DC

## 2019-09-25 MED ORDER — VANCOMYCIN HCL IN DEXTROSE 1-5 GM/200ML-% IV SOLN
1000.0000 mg | Freq: Once | INTRAVENOUS | Status: AC
  Administered 2019-09-25: 1000 mg via INTRAVENOUS
  Filled 2019-09-25: qty 200

## 2019-09-25 MED ORDER — SODIUM CHLORIDE 0.9 % IV SOLN
INTRAVENOUS | Status: AC
  Administered 2019-09-26 (×3): via INTRAVENOUS

## 2019-09-25 MED ORDER — ONDANSETRON HCL 4 MG/2ML IJ SOLN: 4.0000 mg | Freq: Four times a day (QID) | INTRAMUSCULAR | Status: DC | PRN

## 2019-09-25 MED ORDER — FERROUS SULFATE 325 (65 FE) MG PO TABS
325.0000 mg | ORAL_TABLET | Freq: Two times a day (BID) | ORAL | Status: DC
  Administered 2019-09-26 – 2019-09-28 (×4): 325 mg via ORAL
  Filled 2019-09-25 (×7): qty 1

## 2019-09-25 MED ORDER — SODIUM CHLORIDE 0.9 % IV SOLN: 2.0000 g | Freq: Once | INTRAVENOUS | Status: DC

## 2019-09-25 MED ORDER — VANCOMYCIN HCL 500 MG IV SOLR
500.0000 mg | Freq: Once | INTRAVENOUS | Status: AC
  Administered 2019-09-25: 500 mg via INTRAVENOUS
  Filled 2019-09-25: qty 500

## 2019-09-25 MED ORDER — METRONIDAZOLE IN NACL 5-0.79 MG/ML-% IV SOLN
500.0000 mg | Freq: Three times a day (TID) | INTRAVENOUS | Status: DC
  Administered 2019-09-26 (×2): 500 mg via INTRAVENOUS
  Filled 2019-09-25 (×3): qty 100

## 2019-09-25 MED ORDER — ENOXAPARIN SODIUM 40 MG/0.4ML ~~LOC~~ SOLN
40.0000 mg | SUBCUTANEOUS | Status: DC
  Filled 2019-09-25: qty 0.4

## 2019-09-25 MED ORDER — SODIUM CHLORIDE 0.9 % IV SOLN
2.0000 g | Freq: Two times a day (BID) | INTRAVENOUS | Status: DC
  Administered 2019-09-26 (×2): 2 g via INTRAVENOUS
  Filled 2019-09-25 (×5): qty 2

## 2019-09-25 MED ORDER — SODIUM CHLORIDE 0.9 % IV BOLUS
1000.0000 mL | Freq: Once | INTRAVENOUS | Status: AC
  Administered 2019-09-25: 20:00:00 1000 mL via INTRAVENOUS

## 2019-09-25 MED ORDER — METRONIDAZOLE IN NACL 5-0.79 MG/ML-% IV SOLN
500.0000 mg | Freq: Once | INTRAVENOUS | Status: AC
  Administered 2019-09-25: 500 mg via INTRAVENOUS
  Filled 2019-09-25: qty 100

## 2019-09-25 MED ORDER — ENSURE ENLIVE PO LIQD
237.0000 mL | Freq: Two times a day (BID) | ORAL | Status: DC
  Administered 2019-09-27 – 2019-09-28 (×4): 237 mL via ORAL

## 2019-09-25 MED ORDER — VANCOMYCIN HCL IN DEXTROSE 1-5 GM/200ML-% IV SOLN
1000.0000 mg | INTRAVENOUS | Status: DC
  Filled 2019-09-25: qty 200

## 2019-09-25 MED ORDER — MIRABEGRON ER 50 MG PO TB24
50.0000 mg | ORAL_TABLET | Freq: Every day | ORAL | Status: DC
  Administered 2019-09-27 – 2019-09-28 (×2): 50 mg via ORAL
  Filled 2019-09-25 (×3): qty 1

## 2019-09-25 MED ORDER — ACETAMINOPHEN 325 MG PO TABS: 650.0000 mg | ORAL_TABLET | Freq: Four times a day (QID) | ORAL | Status: DC | PRN

## 2019-09-25 MED ORDER — ONDANSETRON HCL 4 MG PO TABS: 4.0000 mg | ORAL_TABLET | Freq: Four times a day (QID) | ORAL | Status: DC | PRN

## 2019-09-25 MED ORDER — ACETAMINOPHEN 325 MG PO TABS
650.0000 mg | ORAL_TABLET | Freq: Once | ORAL | Status: AC | PRN
  Administered 2019-09-25: 650 mg via ORAL
  Filled 2019-09-25: qty 2

## 2019-09-25 MED ORDER — IOHEXOL 300 MG/ML  SOLN
100.0000 mL | Freq: Once | INTRAMUSCULAR | Status: AC | PRN
  Administered 2019-09-25: 100 mL via INTRAVENOUS

## 2019-09-25 MED ORDER — VITAMIN B-12 1000 MCG PO TABS
500.0000 ug | ORAL_TABLET | Freq: Every day | ORAL | Status: DC
  Administered 2019-09-27 – 2019-09-28 (×2): 500 ug via ORAL
  Filled 2019-09-25 (×2): qty 1
  Filled 2019-09-25: qty 0.5

## 2019-09-25 NOTE — ED Provider Notes (Signed)
[facility] Emergency Department Provider Note    First MD Initiated Contact with Patient 09/25/19 1926     (approximate)  I have reviewed the triage vital signs and the nursing notes.   HISTORY  Chief Complaint Code Sepsis  Level V Caveat:  AMS  HPI Vincent Poole is a 84 y.o. male presents to the ER for altered mental status and unresponsiveness becoming more confused over the past 24 hours.  Does have extensive history with previous admissions for sepsis.  He appears confused but protecting his airway arrives tachycardic febrile.  Is complaining of abdominal pain.  No shortness of breath or chest pain.    Past Medical History:  Diagnosis Date  . Anxiety   . Arthritis   . Bladder cancer ([place])   . Dysrhythmia 07/2018   history of atrial flutter that worsens with anxiety  . Femur fracture, right ([place]) 05/01/2018  . GERD (gastroesophageal reflux disease)   . History of recent blood transfusion 05/2018  . Hypertension   . Iron deficiency anemia 09/14/2018  . Prostate cancer ([place]) 07/2018   cancer growing in prostate but not prostate cancer, it is from the bladder  . Umbilical hernia 94/1740  . Urinary retention 2019   foley catheter place 11/2017  . UTI (urinary tract infection) 2019   frequent UTI's over last year  . Wound eschar of foot 07/2018   left heal getting wrapped and requiring antibiotic cream. cracks open with weight bearing.   Family History  Problem Relation Age of Onset  . Cancer Mother   . Chronic Renal Failure Mother   . Heart disease Father    Past Surgical History:  Procedure Laterality Date  . CARPAL TUNNEL RELEASE Right   . CHOLECYSTECTOMY  2004  . COLOSTOMY N/A 06/08/2019   Procedure: COLOSTOMY;  Surgeon: Herbert Pun, MD;  Location: [facility];  Service: General;  Laterality: N/A;  . CYSTOGRAM  07/18/2018   Procedure: CYSTOGRAM;  Surgeon: [name], MD;  Location: [facility];  Service: Urology;;  .  Consuela Mimes W/ RETROGRADES Bilateral 07/18/2018   Procedure: CYSTOSCOPY WITH RETROGRADE PYELOGRAM;  Surgeon: [name], MD;  Location: [facility];  Service: Urology;  Laterality: Bilateral;  . CYSTOSCOPY W/ URETERAL STENT PLACEMENT Bilateral 12/27/2017   Procedure: CYSTOSCOPY WITH RETROGRADE PYELOGRAM/URETERAL STENT PLACEMENT;  Surgeon: [name], MD;  Location: [facility];  Service: Urology;  Laterality: Bilateral;  . CYSTOSCOPY W/ URETERAL STENT PLACEMENT Bilateral 07/18/2018   Procedure: CYSTOSCOPY WITH STENT REPLACEMENT (exchange);  Surgeon: [name], MD;  Location: [facility];  Service: Urology;  Laterality: Bilateral;  . CYSTOSCOPY WITH STENT PLACEMENT Bilateral 11/12/2018   Procedure: [place] WITH STENT Exchange;  Surgeon: [name], MD;  Location: [facility];  Service: Urology;  Laterality: Bilateral;  . INTRAMEDULLARY (IM) NAIL INTERTROCHANTERIC Right 05/02/2018   Procedure: INTRAMEDULLARY (IM) NAIL INTERTROCHANTRIC;  Surgeon: [name], MD;  Location: [facility];  Service: Orthopedics;  Laterality: Right;  . IR NEPHROSTOMY EXCHANGE LEFT  03/25/2019  . IR NEPHROSTOMY EXCHANGE LEFT  04/19/2019  . IR NEPHROSTOMY EXCHANGE LEFT  05/31/2019  . IR NEPHROSTOMY EXCHANGE LEFT  07/19/2019  . IR NEPHROSTOMY EXCHANGE LEFT  09/13/2019  . IR NEPHROSTOMY EXCHANGE RIGHT  03/25/2019  . IR NEPHROSTOMY EXCHANGE RIGHT  04/19/2019  . IR NEPHROSTOMY EXCHANGE RIGHT  05/31/2019  . IR NEPHROSTOMY EXCHANGE RIGHT  07/19/2019  . IR NEPHROSTOMY EXCHANGE RIGHT  09/13/2019  . IR NEPHROSTOMY PLACEMENT LEFT  02/23/2019  . IR NEPHROSTOMY PLACEMENT RIGHT  02/23/2019  . LAPAROTOMY N/A 06/08/2019   Procedure: EXPLORATORY LAPAROTOMY;  Surgeon: Herbert Pun, MD;  Location: [facility];  Service: General;  Laterality: N/A;  . LEG TENDON SURGERY Right 1958  . PORTA CATH INSERTION N/A 01/22/2018   Procedure: PORTA CATH INSERTION;  Surgeon: [name], MD;  Location: [facility];  Service: Cardiovascular;   Laterality: N/A;  . TRANSURETHRAL RESECTION OF BLADDER TUMOR N/A 12/27/2017   Procedure: TRANSURETHRAL RESECTION OF BLADDER TUMOR (TURBT);  Surgeon: [name], MD;  Location: [facility];  Service: Urology;  Laterality: N/A;  . TRANSURETHRAL RESECTION OF BLADDER TUMOR N/A 01/15/2018   Procedure: TRANSURETHRAL RESECTION OF BLADDER TUMOR (TURBT);  Surgeon: [name], MD;  Location: [facility];  Service: Urology;  Laterality: N/A;  Need 2 hrs for this case please  . TRANSURETHRAL RESECTION OF BLADDER TUMOR N/A 07/18/2018   Procedure: TRANSURETHRAL RESECTION OF BLADDER TUMOR (TURBT);  Surgeon: [name], MD;  Location: [facility];  Service: Urology;  Laterality: N/A;   Patient Active Problem List   Diagnosis Date Noted  . S/P partial resection of colon 07/17/2019  . Protein-calorie malnutrition, severe 06/11/2019  . Abdominal pain, generalized   . Palliative care by specialist   . Urothelial cancer ([place])   . Weakness   . Large bowel obstruction ([place]) 06/06/2019  . Hydronephrosis, right 04/08/2019  . Pressure injury of skin 03/23/2019  . SIRS (systemic inflammatory response syndrome) ([place]) 03/22/2019  . Leukocytosis 02/21/2019  . Attention to nephrostomy ([place]) 11/22/2018  . Complicated UTI (urinary tract infection) 11/14/2018  . Palliative care encounter   . Iron deficiency anemia secondary to inadequate dietary iron intake 06/27/2018  . B12 deficiency 06/27/2018  . History of fracture of right hip 06/26/2018  . History of normocytic normochromic anemia 06/26/2018  . Personal history of bladder cancer 06/26/2018  . Hip fracture ([place]) 05/01/2018  . Malignant neoplasm of urinary bladder ([place]) 01/12/2018  . Goals of care, counseling/discussion 01/12/2018  . History of recurrent UTIs 08/03/2017  . Pyelonephritis 05/31/2017  . UTI (urinary tract infection) 03/04/2017  . Urinary obstruction   . Essential hypertension 08/30/2016  . History of shingles 08/30/2016  . Prostate cancer  ([place]) 08/30/2016  . Urinary retention 08/30/2016  . Medicare annual wellness visit, initial 08/01/2016  . Medicare annual wellness visit, subsequent 08/01/2016  . Sepsis ([place]) 07/11/2016  . Borderline diabetes mellitus 01/26/2016  . Vaccine counseling 01/26/2015  . Lumbar radiculitis 06/24/2014  . OA (osteoarthritis) of hip 06/24/2014  . Malignant neoplasm of prostate ([place]) 01/27/2011      Prior to Admission medications   Medication Sig Start Date End Date Taking? Authorizing Provider  acetaminophen (TYLENOL) 500 MG tablet Take 1,000 mg by mouth every 6 (six) hours as needed for moderate pain or headache.     [provider]  cetirizine (ZYRTEC) 10 MG tablet Take 10 mg by mouth daily as needed for allergies.     [provider]  Cranberry-Cholecalciferol (SUPER CRANBERRY/VITAMIN D3) 4200-500 MG-UNIT CAPS Take 1 capsule by mouth 2 (two) times daily.    [provider]  dexamethasone (DECADRON) 4 MG tablet Take 2 tablets (8 mg total) by mouth daily. Start the day after chemotherapy for 2 days. 08/01/19   Sindy Guadeloupe, MD  feeding supplement, ENSURE ENLIVE, (ENSURE ENLIVE) LIQD Take 237 mLs by mouth 2 (two) times daily between meals. 06/11/19   Bettey Costa, MD  ferrous sulfate 325 (65 FE) MG tablet Take 1 tablet (325 mg total) by  mouth 2 (two) times daily with a meal. 05/06/18   Gouru, Aruna, MD  Glycerin-Hypromellose-PEG 400 (DRY EYE RELIEF DROPS OP) Apply 1 drop to eye daily.    [provider]  lidocaine-prilocaine (EMLA) cream Apply 1 application topically as needed. 01/02/19   Sindy Guadeloupe, MD  Multiple Vitamin (MULTIVITAMIN WITH MINERALS) TABS tablet Take 1 tablet by mouth daily.    [provider]  MYRBETRIQ 50 MG TB24 tablet TAKE 1 TABLET BY MOUTH DAILY 04/30/19   Ernestine Conrad, Larene Beach A, PA-C  nystatin [facility]) powder Apply topically 2 (two) times daily. Patient taking differently: Apply 1 g topically 2 (two) times daily as needed.  09/28/18    Zara Council A, PA-C  ondansetron (ZOFRAN) 8 MG tablet Take 1 tablet (8 mg total) by mouth 2 (two) times daily as needed for refractory nausea / vomiting. Start on day 3 after chemo. 08/01/19   Sindy Guadeloupe, MD  oxyCODONE (OXY IR/ROXICODONE) 5 MG immediate release tablet Take 1 tablet (5 mg total) by mouth every 4 (four) hours as needed for severe pain. 03/21/19   Sindy Guadeloupe, MD  polyethylene glycol powder (MIRALAX) powder Take 17 g by mouth daily as needed. Can increase to 3 times a day as needed for constipation but hold medication if has diarrhea 01/30/18   Sindy Guadeloupe, MD  prochlorperazine (COMPAZINE) 10 MG tablet Take 1 tablet (10 mg total) by mouth every 6 (six) hours as needed (Nausea or vomiting). 08/01/19   Sindy Guadeloupe, MD  vitamin B-12 (CYANOCOBALAMIN) 500 MCG tablet Take 500 mcg by mouth daily.    [provider]  zinc oxide 20 % ointment Apply 1 application topically as needed for irritation.    [provider]    Allergies Patient has no known allergies.    Social History Social History   Tobacco Use  . Smoking status: Never Smoker  . Smokeless tobacco: Never Used  Substance Use Topics  . Alcohol use: No    Alcohol/week: 0.0 standard drinks  . Drug use: No    Review of Systems Patient denies headaches, rhinorrhea, blurry vision, numbness, shortness of breath, chest pain, edema, cough, abdominal pain, nausea, vomiting, diarrhea, dysuria, fevers, rashes or hallucinations unless otherwise stated above in HPI. ____________________________________________   PHYSICAL EXAM:  VITAL SIGNS: Vitals:   09/25/19 2200 09/25/19 2215  BP: (!) 111/55   Pulse:  (!) 102  Resp: 17 (!) 22  Temp:    SpO2:  95%    Constitutional: Alert and, disoriented, chronically ill appearing. Eyes: Conjunctivae are normal.  Head: Atraumatic. Nose: No congestion/rhinnorhea. Mouth/Throat: Mucous membranes are moist.   Neck: No stridor. Painless ROM.   Cardiovascular: tachycardic rate, regular rhythm. Grossly normal heart sounds.  Good peripheral circulation. Respiratory: Normal respiratory effort.  No retractions. Lungs CTAB. Gastrointestinal: Soft with diffuse ttp.  llq ostomy appears well perfused.  No melena or hematochezia No distention. No abdominal bruits. No CVA tenderness. Genitourinary:  Musculoskeletal: No lower extremity tenderness nor edema.  No joint effusions. Neurologic:  Normal speech and language. No gross focal neurologic deficits are appreciated. No facial droop Skin:  Skin is warm, dry and intact. No rash noted. Psychiatric: Mood and affect are normal. Speech and behavior are normal.  ____________________________________________   LABS (all labs ordered are listed, but only abnormal results are displayed)  Results for orders placed or performed during the hospital encounter of 09/25/19 (from the past 24 hour(s))  Comprehensive metabolic panel  Status: Abnormal   Collection Time: 09/25/19  7:20 PM  Result Value Ref Range   Sodium 135 135 - 145 mmol/L   Potassium 3.9 3.5 - 5.1 mmol/L   Chloride 101 98 - 111 mmol/L   CO2 21 (L) 22 - 32 mmol/L   Glucose, Bld 190 (H) 70 - 99 mg/dL   BUN 22 8 - 23 mg/dL   Creatinine, Ser 1.07 0.61 - 1.24 mg/dL   Calcium 8.5 (L) 8.9 - 10.3 mg/dL   Total Protein 6.1 (L) 6.5 - 8.1 g/dL   Albumin 2.8 (L) 3.5 - 5.0 g/dL   AST 21 15 - 41 U/L   ALT 12 0 - 44 U/L   Alkaline Phosphatase 105 38 - 126 U/L   Total Bilirubin 0.6 0.3 - 1.2 mg/dL   GFR calc non Af Amer >60 >60 mL/min   GFR calc Af Amer >60 >60 mL/min   Anion gap 13 5 - 15  CBC with Differential     Status: Abnormal   Collection Time: 09/25/19  7:20 PM  Result Value Ref Range   WBC 46.9 (H) 4.0 - 10.5 K/uL   RBC 4.47 4.22 - 5.81 MIL/uL   Hemoglobin 12.6 (L) 13.0 - 17.0 g/dL   HCT 38.6 (L) 39.0 - 52.0 %   MCV 86.4 80.0 - 100.0 fL   MCH 28.2 26.0 - 34.0 pg   MCHC 32.6 30.0 - 36.0 g/dL   RDW 19.3 (H) 11.5 - 15.5 %    Platelets 314 150 - 400 K/uL   nRBC 0.0 0.0 - 0.2 %   Neutrophils Relative % 84 %   Neutro Abs 39.2 (H) 1.7 - 7.7 K/uL   Lymphocytes Relative 8 %   Lymphs Abs 3.6 0.7 - 4.0 K/uL   Monocytes Relative 5 %   Monocytes Absolute 2.3 (H) 0.1 - 1.0 K/uL   Eosinophils Relative 1 %   Eosinophils Absolute 0.5 0.0 - 0.5 K/uL   Basophils Relative 0 %   Basophils Absolute 0.2 (H) 0.0 - 0.1 K/uL   WBC Morphology TOXIC GRANULATION    RBC Morphology MIXED RBC POPULATION    Smear Review Normal platelet morphology    Immature Granulocytes 2 %   Abs Immature Granulocytes 1.08 (H) 0.00 - 0.07 K/uL  Protime-INR     Status: None   Collection Time: 09/25/19  7:20 PM  Result Value Ref Range   Prothrombin Time 14.0 11.4 - 15.2 seconds   INR 1.1 0.8 - 1.2  Lactic acid, plasma     Status: Abnormal   Collection Time: 09/25/19  7:30 PM  Result Value Ref Range   Lactic Acid, Venous 5.0 (HH) 0.5 - 1.9 mmol/L  Lactic acid, plasma     Status: None   Collection Time: 09/25/19  9:20 PM  Result Value Ref Range   Lactic Acid, Venous 1.0 0.5 - 1.9 mmol/L  Procalcitonin     Status: None   Collection Time: 09/25/19  9:21 PM  Result Value Ref Range   Procalcitonin 0.33 ng/mL   ____________________________________________  EKG My review and personal interpretation at Time: 19:19   Indication: sepsis  Rate: 150  Rhythm: sinus tach Axis: normal Other: tachycardic with nonspecific st abn likely rate dependent ____________________________________________  RADIOLOGY  I personally reviewed all radiographic images ordered to evaluate for the above acute complaints and reviewed radiology reports and findings.  These findings were personally discussed with the patient.  Please see medical record for radiology report.  ____________________________________________  PROCEDURES  Procedure(s) performed:  .Critical Care Performed by: Merlyn Lot, MD Authorized by: Merlyn Lot, MD   Critical care  provider statement:    Critical care time (minutes):  40   Critical care time was exclusive of:  Separately billable procedures and treating other patients   Critical care was necessary to treat or prevent imminent or life-threatening deterioration of the following conditions:  Sepsis   Critical care was time spent personally by me on the following activities:  Development of treatment plan with patient or surrogate, discussions with consultants, evaluation of patient's response to treatment, examination of patient, obtaining history from patient or surrogate, ordering and performing treatments and interventions, ordering and review of laboratory studies, ordering and review of radiographic studies, pulse oximetry, re-evaluation of patient's condition and review of old charts      Critical Care performed: yes ____________________________________________   INITIAL IMPRESSION / [place] / ED COURSE  Pertinent labs & imaging results that were available during my care of the patient were reviewed by me and considered in my medical decision making (see chart for details).   DDX: Dehydration, sepsis, pna, uti, hypoglycemia, cva, drug effect, withdrawal, encephalitis   Vincent Poole is a 84 y.o. who presents to the ED with sepsis altered mental status and exam as above.  Blood work sent for above differential.  Will start IV fluid resuscitation.  Given the report of unresponsiveness and possible seizure-like activity will order CT imaging of the head.  Given his abdominal pain and history will order CT imaging of the belly.  Chest x-ray already resulted does show evidence of probable pneumonia though is not profoundly hypoxic.  Has had multiple admissions to hospital for sepsis prior.  Anticipate he will require admission again.  The patient will be placed on continuous pulse oximetry and telemetry for monitoring.  Laboratory evaluation will be sent to evaluate for the above complaints.      Clinical Course as of Sep 24 2226  Wed Sep 25, 2019  2158 CT imaging reviewed.  Awaiting repeat lactate.  Patient receiving IV fluids and heart rate is improving.  Does have evidence of pneumonia though is not hypoxic.  Suspect urinary source given known mass with probable infection related to that.  Has received broad-spectrum antibiotics.  Have discussed with hospitalist for admission.   [PR]  2222 Did discuss results and clinical course with the patient's spouse via the phone.  She is getting come visit with the patient this evening.  He appears clinically improved.  Repeat lactate now 1.  She states that he is a DNR would not want to be placed on mechanical ventilation or have CPR performed.  Fortunately clinically has improved.  Is information was relayed to the hospitalist.   [PR]    Clinical Course User Index [PR] Merlyn Lot, MD    The patient was evaluated in Emergency Department today for the symptoms described in the history of present illness. He/she was evaluated in the context of the global COVID-19 pandemic, which necessitated consideration that the patient might be at risk for infection with the SARS-CoV-2 virus that causes COVID-19. Institutional protocols and algorithms that pertain to the evaluation of patients at risk for COVID-19 are in a state of rapid change based on information released by regulatory bodies including the CDC and federal and state organizations. These policies and algorithms were followed during the patient's care in the ED.  As part of my medical decision making, I reviewed the following  data within the [facility] notes reviewed and incorporated, Labs reviewed, notes from prior ED visits and [place] Controlled Substance Database   ____________________________________________   FINAL CLINICAL IMPRESSION(S) / ED DIAGNOSES  Final diagnoses:  Sepsis with encephalopathy without septic shock, due to unspecified organism [facility])       NEW MEDICATIONS STARTED DURING THIS VISIT:  New Prescriptions   No medications on file     Note:  This document was prepared using Dragon voice recognition software and may include unintentional dictation errors.    Merlyn Lot, MD 09/25/19 2227

## 2019-09-25 NOTE — Progress Notes (Signed)
CODE SEPSIS - PHARMACY COMMUNICATION  **Broad Spectrum Antibiotics should be administered within 1 hour of Sepsis diagnosis**  Time Code Sepsis Called/Page Received: 2325  Antibiotics Ordered: vanc/cefepime  Time of 1st antibiotic administration: 2109  Additional action taken by pharmacy:   If necessary, Name of Provider/Nurse Contacted:     Tobie Lords ,PharmD Clinical Pharmacist  09/25/2019  11:40 PM

## 2019-09-25 NOTE — ED Notes (Signed)
Pt taken to Xray.

## 2019-09-25 NOTE — ED Notes (Signed)
Colostomy bag emptied, specimen obtained from nephrostomy tube

## 2019-09-25 NOTE — ED Notes (Signed)
Pt back from X-ray.  

## 2019-09-25 NOTE — Progress Notes (Signed)
Pharmacy Antibiotic Note  Vincent Poole is a 84 y.o. male admitted on 09/25/2019 with sepsis.  Pharmacy has been consulted for vanc/cefepime dosing.  Plan: Vancomycin 1000 mg IV Q 24 hrs. Goal AUC 400-550. Expected AUC: 449.4 SCr used: 1.07 Cssmin: 10.8  Will continue cefepime 2g IV q12h per CrCl 30 - 59 ml/min and will continue to monitor s/sx of infx and renal function and adjust as needed.  Height: 5\' 7"  (170.2 cm) Weight: 149 lb (67.6 kg) IBW/kg (Calculated) : 66.1  Temp (24hrs), Avg:100.3 F (37.9 C), Min:98 F (36.7 C), Max:102.5 F (39.2 C)  Recent Labs  Lab 09/20/19 1100 09/25/19 1920 09/25/19 1930 09/25/19 2120  WBC 34.3* 46.9*  --   --   CREATININE 0.94 1.07  --   --   LATICACIDVEN  --   --  5.0* 1.0    Estimated Creatinine Clearance: 49.8 mL/min (by C-G formula based on SCr of 1.07 mg/dL).    No Known Allergies  Thank you for allowing pharmacy to be a part of this patient's care.  Tobie Lords, PharmD, BCPS Clinical Pharmacist 09/25/2019 11:44 PM

## 2019-09-25 NOTE — ED Notes (Signed)
OK to obtain sample from nephrostomy tube

## 2019-09-25 NOTE — H&P (Addendum)
History and Physical    Vincent Poole EVO:[id number] DOB: September 21, 1935 DOA: 09/25/2019  PCP: [name], MD  Patient coming from: Home.  History obtained from ER physician as patient appears confused.  Unable to reach patient's family with the number provided at this time.  Chief Complaint: Fever and possible seizure.  HPI: Vincent Poole is a 84 y.o. male with history of locally advanced bladder cancer with diverting colostomy and bilateral nephrostomy being followed by oncologist.  Previous history of prostate cancer, DVT of the lower extremity history of anemia on iron and B12 supplements was found to be acutely confused with seizure-like activity per patient's family who talked with the ER physician.  Following which patient became more responsive and febrile.  Patient was brought to the ER.  Per the report patient did not have any nausea vomiting or diarrhea.  ED Course: In the ER patient was initially confused with a fever of 102.5 F blood pressure was around 91 systolic tachycardic in the 140s blood work showed lactate of 5 with WBC count of 46 creatinine 1 platelets 314 hemoglobin 12 patient was given sepsis protocol fluid bolus and empiric antibiotics.  Chest x-ray shows infiltrates concerning for pneumonia.  CT head and CT abdomen was done which not show anything acute.  Does show increasing mass in the bladder consistent with patient's bladder tumor.  UA is concerning for UTI.  Patient admitted for sepsis with acute encephalopathy secondary to possible source could be UTI and possible pneumonia.  COVID-19 test is pending.  Repeat lactate is improved.  And at the time of my exam patient is more alert awake.  Following commands moving all extremities.  Review of Systems: As per HPI, rest all negative.   Past Medical History:  Diagnosis Date   Anxiety    Arthritis    Bladder cancer ([place])    Dysrhythmia 07/2018   history of atrial flutter that worsens with anxiety     Femur fracture, right ([place]) 05/01/2018   GERD (gastroesophageal reflux disease)    History of recent blood transfusion 05/2018   Hypertension    Iron deficiency anemia 09/14/2018   Prostate cancer ([place]) 07/2018   cancer growing in prostate but not prostate cancer, it is from the bladder   Umbilical hernia 29/9371   Urinary retention 2019   foley catheter place 11/2017   UTI (urinary tract infection) 2019   frequent UTI's over last year   Wound eschar of foot 07/2018   left heal getting wrapped and requiring antibiotic cream. cracks open with weight bearing.    Past Surgical History:  Procedure Laterality Date   CARPAL TUNNEL RELEASE Right    CHOLECYSTECTOMY  2004   COLOSTOMY N/A 06/08/2019   Procedure: COLOSTOMY;  Surgeon: Herbert Pun, MD;  Location: [facility];  Service: General;  Laterality: N/A;   CYSTOGRAM  07/18/2018   Procedure: CYSTOGRAM;  Surgeon: [name], MD;  Location: [facility];  Service: Urology;;   Consuela Mimes W/ RETROGRADES Bilateral 07/18/2018   Procedure: CYSTOSCOPY WITH RETROGRADE PYELOGRAM;  Surgeon: [name], MD;  Location: [facility];  Service: Urology;  Laterality: Bilateral;   CYSTOSCOPY W/ URETERAL STENT PLACEMENT Bilateral 12/27/2017   Procedure: CYSTOSCOPY WITH RETROGRADE PYELOGRAM/URETERAL STENT PLACEMENT;  Surgeon: [name], MD;  Location: [facility];  Service: Urology;  Laterality: Bilateral;   CYSTOSCOPY W/ URETERAL STENT PLACEMENT Bilateral 07/18/2018   Procedure: CYSTOSCOPY WITH STENT REPLACEMENT (exchange);  Surgeon: [name], MD;  Location: [facility];  Service: Urology;  Laterality: Bilateral;   CYSTOSCOPY WITH STENT PLACEMENT Bilateral 11/12/2018   Procedure: [place] WITH STENT Exchange;  Surgeon: [name], MD;  Location: [facility];  Service: Urology;  Laterality: Bilateral;   INTRAMEDULLARY (IM) NAIL INTERTROCHANTERIC Right 05/02/2018   Procedure: INTRAMEDULLARY (IM) NAIL INTERTROCHANTRIC;  Surgeon:  [name], MD;  Location: [facility];  Service: Orthopedics;  Laterality: Right;   IR NEPHROSTOMY EXCHANGE LEFT  03/25/2019   IR NEPHROSTOMY EXCHANGE LEFT  04/19/2019   IR NEPHROSTOMY EXCHANGE LEFT  05/31/2019   IR NEPHROSTOMY EXCHANGE LEFT  07/19/2019   IR NEPHROSTOMY EXCHANGE LEFT  09/13/2019   IR NEPHROSTOMY EXCHANGE RIGHT  03/25/2019   IR NEPHROSTOMY EXCHANGE RIGHT  04/19/2019   IR NEPHROSTOMY EXCHANGE RIGHT  05/31/2019   IR NEPHROSTOMY EXCHANGE RIGHT  07/19/2019   IR NEPHROSTOMY EXCHANGE RIGHT  09/13/2019   IR NEPHROSTOMY PLACEMENT LEFT  02/23/2019   IR NEPHROSTOMY PLACEMENT RIGHT  02/23/2019   LAPAROTOMY N/A 06/08/2019   Procedure: EXPLORATORY LAPAROTOMY;  Surgeon: Herbert Pun, MD;  Location: [facility];  Service: General;  Laterality: N/A;   LEG TENDON SURGERY Right 1958   PORTA CATH INSERTION N/A 01/22/2018   Procedure: PORTA CATH INSERTION;  Surgeon: [name], MD;  Location: [place] [facility];  Service: Cardiovascular;  Laterality: N/A;   TRANSURETHRAL RESECTION OF BLADDER TUMOR N/A 12/27/2017   Procedure: TRANSURETHRAL RESECTION OF BLADDER TUMOR (TURBT);  Surgeon: [name], MD;  Location: [facility];  Service: Urology;  Laterality: N/A;   TRANSURETHRAL RESECTION OF BLADDER TUMOR N/A 01/15/2018   Procedure: TRANSURETHRAL RESECTION OF BLADDER TUMOR (TURBT);  Surgeon: [name], MD;  Location: [facility];  Service: Urology;  Laterality: N/A;  Need 2 hrs for this case please   TRANSURETHRAL RESECTION OF BLADDER TUMOR N/A 07/18/2018   Procedure: TRANSURETHRAL RESECTION OF BLADDER TUMOR (TURBT);  Surgeon: [name], MD;  Location: [facility];  Service: Urology;  Laterality: N/A;     reports that he has never smoked. He has never used smokeless tobacco. He reports that he does not drink alcohol or use drugs.  No Known Allergies  Family History  Problem Relation Age of Onset   Cancer Mother    Chronic Renal Failure Mother    Heart disease Father      Prior to Admission medications   Medication Sig Start Date End Date Taking? Authorizing Provider  acetaminophen (TYLENOL) 500 MG tablet Take 1,000 mg by mouth every 6 (six) hours as needed for moderate pain or headache.     [provider]  cetirizine (ZYRTEC) 10 MG tablet Take 10 mg by mouth daily as needed for allergies.     [provider]  Cranberry-Cholecalciferol (SUPER CRANBERRY/VITAMIN D3) 4200-500 MG-UNIT CAPS Take 1 capsule by mouth 2 (two) times daily.    [provider]  dexamethasone (DECADRON) 4 MG tablet Take 2 tablets (8 mg total) by mouth daily. Start the day after chemotherapy for 2 days. 08/01/19   Sindy Guadeloupe, MD  feeding supplement, ENSURE ENLIVE, (ENSURE ENLIVE) LIQD Take 237 mLs by mouth 2 (two) times daily between meals. 06/11/19   Bettey Costa, MD  ferrous sulfate 325 (65 FE) MG tablet Take 1 tablet (325 mg total) by mouth 2 (two) times daily with a meal. 05/06/18   Gouru, Aruna, MD  Glycerin-Hypromellose-PEG 400 (DRY EYE RELIEF DROPS OP) Apply 1 drop to eye daily.    [provider]  lidocaine-prilocaine (EMLA) cream Apply 1 application topically as needed. 01/02/19   [name],  Weston Anna, MD  Multiple Vitamin (MULTIVITAMIN WITH MINERALS) TABS tablet Take 1 tablet by mouth daily.    [provider]  MYRBETRIQ 50 MG TB24 tablet TAKE 1 TABLET BY MOUTH DAILY Patient taking differently: Take 50 mg by mouth daily.  04/30/19   Zara Council A, PA-C  nystatin [facility]) powder Apply topically 2 (two) times daily. Patient taking differently: Apply 1 g topically 2 (two) times daily as needed.  09/28/18   Zara Council A, PA-C  ondansetron (ZOFRAN) 8 MG tablet Take 1 tablet (8 mg total) by mouth 2 (two) times daily as needed for refractory nausea / vomiting. Start on day 3 after chemo. 08/01/19   Sindy Guadeloupe, MD  oxyCODONE (OXY IR/ROXICODONE) 5 MG immediate release tablet Take 1 tablet (5 mg total) by mouth every 4 (four) hours as needed  for severe pain. 03/21/19   Sindy Guadeloupe, MD  polyethylene glycol powder (MIRALAX) powder Take 17 g by mouth daily as needed. Can increase to 3 times a day as needed for constipation but hold medication if has diarrhea 01/30/18   Sindy Guadeloupe, MD  prochlorperazine (COMPAZINE) 10 MG tablet Take 1 tablet (10 mg total) by mouth every 6 (six) hours as needed (Nausea or vomiting). 08/01/19   Sindy Guadeloupe, MD  vitamin B-12 (CYANOCOBALAMIN) 500 MCG tablet Take 500 mcg by mouth daily.    [provider]  zinc oxide 20 % ointment Apply 1 application topically as needed for irritation.    [provider]    Physical Exam: Constitutional: Moderately built and nourished. Vitals:   09/25/19 2130 09/25/19 2200 09/25/19 2215 09/25/19 2236  BP: (!) 113/56 (!) 111/55  (!) 91/53  Pulse: (!) 110  (!) 102 99  Resp: (!) 23 17 (!) 22 (!) 23  Temp:      TempSrc:      SpO2: 94%  95% 96%  Weight:      Height:       Eyes: Nonicteric no pallor. ENMT: No discharge from the ears eyes nose or mouth. Neck: No mass felt.  No neck rigidity. Respiratory: No rhonchi or crepitations. Cardiovascular: S1-S2 heard. Abdomen: Bilateral nephrostomy colostomy seen.  No guarding or rigidity. Musculoskeletal: No edema. Skin: No rash. Neurologic: Alert awake oriented to his name.  Otherwise confused moves all extremities. Psychiatric: Oriented to his name.   Labs on Admission: I have personally reviewed following labs and imaging studies  CBC: Recent Labs  Lab 09/20/19 1100 09/25/19 1920  WBC 34.3* 46.9*  NEUTROABS 30.0* 39.2*  HGB 11.7* 12.6*  HCT 37.6* 38.6*  MCV 90.0 86.4  PLT 353 102   Basic Metabolic Panel: Recent Labs  Lab 09/20/19 1100 09/25/19 1920  NA 133* 135  K 3.8 3.9  CL 103 101  CO2 23 21*  GLUCOSE 137* 190*  BUN 18 22  CREATININE 0.94 1.07  CALCIUM 8.3* 8.5*   GFR: Estimated Creatinine Clearance: 49.8 mL/min (by C-G formula based on SCr of 1.07 mg/dL). Liver  Function Tests: Recent Labs  Lab 09/20/19 1100 09/25/19 1920  AST 12* 21  ALT 10 12  ALKPHOS 87 105  BILITOT 0.4 0.6  PROT 6.2* 6.1*  ALBUMIN 2.9* 2.8*   No results for input(s): LIPASE, AMYLASE in the last 168 hours. No results for input(s): AMMONIA in the last 168 hours. Coagulation Profile: Recent Labs  Lab 09/25/19 1920  INR 1.1   Cardiac Enzymes: No results for input(s): CKTOTAL, CKMB, CKMBINDEX, TROPONINI in the last  168 hours. BNP (last 3 results) No results for input(s): PROBNP in the last 8760 hours. HbA1C: No results for input(s): HGBA1C in the last 72 hours. CBG: No results for input(s): GLUCAP in the last 168 hours. Lipid Profile: No results for input(s): CHOL, HDL, LDLCALC, TRIG, CHOLHDL, LDLDIRECT in the last 72 hours. Thyroid Function Tests: No results for input(s): TSH, T4TOTAL, FREET4, T3FREE, THYROIDAB in the last 72 hours. Anemia Panel: No results for input(s): VITAMINB12, FOLATE, FERRITIN, TIBC, IRON, RETICCTPCT in the last 72 hours. Urine analysis:    Component Value Date/Time   COLORURINE YELLOW (A) 09/25/2019 1930   APPEARANCEUR CLOUDY (A) 09/25/2019 1930   APPEARANCEUR Cloudy (A) 10/23/2018 1327   LABSPEC 1.031 (H) 09/25/2019 1930   PHURINE 5.0 09/25/2019 1930   GLUCOSEU NEGATIVE 09/25/2019 1930   HGBUR NEGATIVE 09/25/2019 1930   [place] NEGATIVE 09/25/2019 1930   BILIRUBINUR Negative 10/23/2018 [place] 09/25/2019 1930   PROTEINUR 30 (A) 09/25/2019 1930   NITRITE POSITIVE (A) 09/25/2019 1930   LEUKOCYTESUR MODERATE (A) 09/25/2019 1930   Sepsis Labs: @LABRCNTIP (procalcitonin:4,lacticidven:4) )No results found for this or any previous visit (from the past 240 hour(s)).   Radiological Exams on Admission: Ct Head Wo Contrast  Result Date: 09/25/2019 CLINICAL DATA:  Seizure EXAM: CT HEAD WITHOUT CONTRAST TECHNIQUE: Contiguous axial images were obtained from the base of the skull through the vertex without intravenous  contrast. COMPARISON:  None. FINDINGS: Brain: There is atrophy and chronic small vessel disease changes. No acute intracranial abnormality. Specifically, no hemorrhage, hydrocephalus, mass lesion, acute infarction, or significant intracranial injury. Vascular: No hyperdense vessel or unexpected calcification. Skull: No acute calvarial abnormality. Sinuses/Orbits: Visualized paranasal sinuses and mastoids clear. Orbital soft tissues unremarkable. Other: None IMPRESSION: Atrophy, chronic microvascular disease. No acute intracranial abnormality. Electronically Signed   By: Rolm Baptise M.D.   On: 09/25/2019 20:42   Ct Abdomen Pelvis W Contrast  Result Date: 09/25/2019 CLINICAL DATA:  Acute abdominal pain. Fever. EXAM: CT ABDOMEN AND PELVIS WITH CONTRAST TECHNIQUE: Multidetector CT imaging of the abdomen and pelvis was performed using the standard protocol following bolus administration of intravenous contrast. CONTRAST:  11mL OMNIPAQUE IOHEXOL 300 MG/ML  SOLN COMPARISON:  09/09/2019 FINDINGS: Lower chest: Aortic atherosclerosis. Extensive coronary artery calcifications. Heart size is normal. No pericardial effusion. Tiny bilateral pleural effusions with minimal bibasilar atelectasis. Hepatobiliary: No focal liver abnormality is seen. Status post cholecystectomy. No biliary dilatation. Pancreas: Unremarkable. No pancreatic ductal dilatation or surrounding inflammatory changes. Spleen: Normal in size without focal abnormality. Adrenals/Urinary Tract: Adrenal glands are normal. Bilateral nephrostomy tubes in place. Slight dilatation of the right renal pelvis and proximal right ureter. 21 mm simple cyst on the mid right kidney. Again noted is a large irregular inhomogeneous mass in the bladder which appears larger than on the prior study. There are irregular fluid collections containing gas in that region consistent with infection. Gas collection protrudes through the midline of the anterior abdominal wall as  demonstrated on the prior exam. Stomach/Bowel: Large amount of stool in the rectum consistent with a fecal impaction. Colostomy in the mid transverse colon. Appendix is normal. No significant change since the prior study. Vascular/Lymphatic: Aortic atherosclerosis. No enlarged abdominal or pelvic lymph nodes. Reproductive: Prostate gland is not identified. Other: No ascites. Musculoskeletal: No acute abnormality. Severe arthritic changes of both hips, right greater than left. IMPRESSION: 1. Interval enlargement of the large irregular inhomogeneous mass in the bladder consistent with the patient's known bladder carcinoma. 2. Large amount of  stool in the rectum consistent with fecal impaction, unchanged. 3. Bilateral nephrostomy tubes in place. Slight dilatation of the right renal pelvis and proximal right ureter. 4. Tiny bilateral pleural effusions with minimal bibasilar atelectasis. 5. Severe arthritic changes of both hips, right greater than left. 6. Aortic Atherosclerosis (ICD10-I70.0). Electronically Signed   By: Lorriane Shire M.D.   On: 09/25/2019 20:47   Dg Chest Portable 1 View  Result Date: 09/25/2019 CLINICAL DATA:  Sepsis, mental status decline. EXAM: PORTABLE CHEST 1 VIEW COMPARISON:  Chest CT 08/08/2019 FINDINGS: Power injectable right Port-A-Cath noted, tip projecting over the SVC. Mild bilateral interstitial accentuation in both lungs with some hazy density in the infrahilar regions potentially from atypical pneumonia or noncardiogenic edema. No cardiomegaly. Atherosclerotic calcification of the aortic arch. No blunting of the costophrenic angles. IMPRESSION: 1. Hazy infrahilar airspace opacity in the lungs bilaterally, potentially from atypical pneumonia or noncardiogenic edema. 2. Atherosclerotic calcification of the aortic arch. 3. Power injectable right Port-A-Cath tip: SVC. Electronically Signed   By: Van Clines M.D.   On: 09/25/2019 20:19    EKG: Independently reviewed.  Sinus  tachycardia.  Assessment/Plan Principal Problem:   Sepsis ([place]) Active Problems:   Prostate cancer ([place])   Malignant neoplasm of urinary bladder ([facility])   Complicated UTI (urinary tract infection)   S/P partial resection of colon    1. Sepsis likely source could be UTI and possible pneumonia for which patient is on empiric antibiotics for now.  COVID-19 test is pending continue hydration follow lactate procalcitonin.  Follow cultures. 2. Acute encephalopathy with possible seizure likely related to sepsis.  CT head unremarkable check EEG.  Patient is presently alert awake following commands. 3. History of locally advanced bladder cancer with diverting colostomy and bilateral nephrostomy being followed by oncologist. 4. History of anemia on iron and B12 supplements.  Follow CBC. 5. Chronic leukocytosis likely related to cancer which is acutely worsened likely from infection.  Being followed by oncologist. 6. History of DVT of the lower extremity was on apixaban was discontinued in August 2020 after 3 months of treatment.  This was confirmed with pharmacy.  COVID-19 test is pending.  Given the septic nature of patient's presentation patient will need close follow-up for any further deterioration in inpatient status.   DVT prophylaxis: Lovenox. Code Status: DNR was confirmed by patient's family to the ER physician. Family Communication: ER physician discussed with family.  I was unable to reach family. Disposition Plan: To be determined. Consults called: None. Admission status: Inpatient.   Rise Patience MD [facility] Pager [phone].  If 7PM-7AM, please contact night-coverage [email or web address] Password [facility]  09/25/2019, 11:27 PM

## 2019-09-25 NOTE — Progress Notes (Signed)
PHARMACY -  BRIEF ANTIBIOTIC NOTE   Pharmacy has received consult(s) for Vancomycin, Cefepime  from an ED provider.  The patient's profile has been reviewed for ht/wt/allergies/indication/available labs.    One time order(s) placed for Cefepime 2 gm IV X 1 and Vancomycin 1500 mg IV X 1.   Further antibiotics/pharmacy consults should be ordered by admitting physician if indicated.                       Thank you, Tory Mckissack D 09/25/2019  8:40 PM

## 2019-09-25 NOTE — ED Triage Notes (Signed)
Pt brought in from home by [facility] for possible sepsis. Per family, pt is normally communicative and responsive but has had a decline following seizure at home. Per family, pt was awake but no responding. Pt has no pmh of seizures but has a hx of prostate & bladder CA EMS VS  T: 101.70F  P: 145 BP: 128/58 RR:20 O2: 94% 2LNC CBG: 138

## 2019-09-25 NOTE — ED Notes (Signed)
Date and time results received: 09/25/19 2010 (use smartphrase ".now" to insert current time)  Test: lactic Critical Value: 5  Name of Provider Notified: robinson  Orders Received? Or Actions Taken?: Orders Received - See Orders for details

## 2019-09-26 ENCOUNTER — Other Ambulatory Visit: Payer: Self-pay

## 2019-09-26 ENCOUNTER — Other Ambulatory Visit: Payer: Self-pay | Admitting: Radiology

## 2019-09-26 ENCOUNTER — Telehealth: Payer: Self-pay | Admitting: *Deleted

## 2019-09-26 DIAGNOSIS — Z9049 Acquired absence of other specified parts of digestive tract: Secondary | ICD-10-CM

## 2019-09-26 DIAGNOSIS — C61 Malignant neoplasm of prostate: Secondary | ICD-10-CM

## 2019-09-26 DIAGNOSIS — C679 Malignant neoplasm of bladder, unspecified: Secondary | ICD-10-CM

## 2019-09-26 DIAGNOSIS — A419 Sepsis, unspecified organism: Secondary | ICD-10-CM

## 2019-09-26 LAB — COMPREHENSIVE METABOLIC PANEL
ALT: 10 U/L (ref 0–44)
AST: 10 U/L — ABNORMAL LOW (ref 15–41)
Albumin: 2.3 g/dL — ABNORMAL LOW (ref 3.5–5.0)
Alkaline Phosphatase: 101 U/L (ref 38–126)
Anion gap: 10 (ref 5–15)
BUN: 22 mg/dL (ref 8–23)
CO2: 26 mmol/L (ref 22–32)
Calcium: 8.4 mg/dL — ABNORMAL LOW (ref 8.9–10.3)
Chloride: 105 mmol/L (ref 98–111)
Creatinine, Ser: 1.08 mg/dL (ref 0.61–1.24)
GFR calc Af Amer: >60 mL/min (ref >60)
GFR calc non Af Amer: >60 mL/min (ref >60)
Glucose, Bld: 122 mg/dL — ABNORMAL HIGH (ref 70–99)
Potassium: 3.8 mmol/L (ref 3.5–5.1)
Sodium: 141 mmol/L (ref 135–145)
Total Bilirubin: 0.4 mg/dL (ref 0.3–1.2)
Total Protein: 5.2 g/dL — ABNORMAL LOW (ref 6.5–8.1)

## 2019-09-26 LAB — CBC WITH DIFFERENTIAL/PLATELET
Abs Immature Granulocytes: 1.17 10*3/uL — ABNORMAL HIGH (ref 0.00–0.07)
Basophils Absolute: 0.2 10*3/uL — ABNORMAL HIGH (ref 0.0–0.1)
Basophils Relative: 0 %
Eosinophils Absolute: 0.4 10*3/uL (ref 0.0–0.5)
Eosinophils Relative: 1 %
HCT: 34.5 % — ABNORMAL LOW (ref 39.0–52.0)
Hemoglobin: 10.7 g/dL — ABNORMAL LOW (ref 13.0–17.0)
Immature Granulocytes: 3 %
Lymphocytes Relative: 5 %
Lymphs Abs: 2.5 10*3/uL (ref 0.7–4.0)
MCH: 27.9 pg (ref 26.0–34.0)
MCHC: 31 g/dL (ref 30.0–36.0)
MCV: 90.1 fL (ref 80.0–100.0)
Monocytes Absolute: 2.4 10*3/uL — ABNORMAL HIGH (ref 0.1–1.0)
Monocytes Relative: 5 %
Neutro Abs: 41 10*3/uL — ABNORMAL HIGH (ref 1.7–7.7)
Neutrophils Relative %: 86 %
Platelets: 268 10*3/uL (ref 150–400)
RBC: 3.83 MIL/uL — ABNORMAL LOW (ref 4.22–5.81)
RDW: 19.3 % — ABNORMAL HIGH (ref 11.5–15.5)
Smear Review: NORMAL
WBC: 47.7 10*3/uL — ABNORMAL HIGH (ref 4.0–10.5)
nRBC: 0 % (ref 0.0–0.2)

## 2019-09-26 LAB — SARS CORONAVIRUS 2 (TAT 6-24 HRS): SARS Coronavirus 2: NEGATIVE

## 2019-09-26 MED ORDER — ENOXAPARIN SODIUM 40 MG/0.4ML ~~LOC~~ SOLN
40.0000 mg | SUBCUTANEOUS | Status: DC
  Administered 2019-09-26 – 2019-09-28 (×3): 40 mg via SUBCUTANEOUS
  Filled 2019-09-26 (×3): qty 0.4

## 2019-09-26 MED ORDER — BISACODYL 10 MG RE SUPP
10.0000 mg | Freq: Every day | RECTAL | Status: DC
  Administered 2019-09-26 – 2019-09-27 (×2): 10 mg via RECTAL
  Filled 2019-09-26 (×2): qty 1

## 2019-09-26 MED ORDER — CHLORHEXIDINE GLUCONATE CLOTH 2 % EX PADS
6.0000 | MEDICATED_PAD | Freq: Every day | CUTANEOUS | Status: DC
  Administered 2019-09-27 – 2019-09-28 (×2): 6 via TOPICAL

## 2019-09-26 NOTE — Procedures (Signed)
ELECTROENCEPHALOGRAM REPORT   Patient: Vincent Poole       Room #: 240A-AA EEG No. ID: [id number] Age: 84 y.o.        Sex: male Referring Physician: Posey Pronto Report Date:  09/26/2019        Interpreting Physician: Alexis Goodell  History: Vincent Poole is an 84 y.o. male with seizure like activity  Medications:  Myrbetriq, Cefepime  Conditions of Recording:  This is a 21 channel routine scalp EEG performed with bipolar and monopolar montages arranged in accordance to the international 10/20 system of electrode placement. One channel was dedicated to EKG recording.  The patient is in the awake state.  Description:  Artifact is prominent during the recording often obscuring the background rhythm. When able to be visualized the background is slow and poorly organized.   It consists of a low voltage, mixture of theta activity and polymorphic delta activity that is diffusely distributed and continuous.  Theta activity is most predominant.   No epileptiform activity is noted.   The patient does not drowse or sleep. Hyperventilation was not performed.  Intermittent photic stimulation was performed but failed to illicit any change in the tracing.   IMPRESSION: This is an abnormal EEG secondary to general background slowing.  This finding may be seen with a diffuse disturbance that is etiologically nonspecific, but may include a metabolic encephalopathy, among other possibilities.  No epileptiform activity is noted.     Alexis Goodell, MD Neurology [phone] 09/26/2019, 5:51 PM

## 2019-09-26 NOTE — ED Notes (Signed)
Patient more awake/alert, very hard of hearing. Emptied nephrostomy and colostomy bags. Repositioned for comfort, no complaints at this time. Waiting for bed

## 2019-09-26 NOTE — Telephone Encounter (Signed)
Wife called to say the pt. Has been doing good. Eating good , feels better, and she has been watching his urine from the nephrostomy tubes and it was good. Last night he sits on side of bed and falls over on bed, eyes glassy, unresponsive. They called EMS and he was sent to ER and have fever 101.5 and has pneumonia and sepsis per the md told ruth. They are waiting for bed for admission. She says to cancel the appt for Friday and she is not sure about appt with chrystal. I told her that I would let [name] and Chrystal team  Know and I will cancel 11/20 appt and dr [name] will following up with him

## 2019-09-26 NOTE — Progress Notes (Signed)
VAST consulted to access implanted port. Pt currently has working PIV in left arm. Spoke with pt's nurse who reported pt and family want his port accessed for blood draws. Reminded unit RN to assess for PIV access need in addition to port and to dc PIV as soon as able to reduce risk for infection.

## 2019-09-26 NOTE — ED Notes (Signed)
Patient resting, NAD

## 2019-09-26 NOTE — Progress Notes (Signed)
eeg completed ° °

## 2019-09-26 NOTE — ED Notes (Signed)
Patient's wife at bedside, patient resting. Was sweating earlier, fever gone. Patient reports he is comfortably, does not need to be readjusted. Wife provided with recliner and TV remote. Call bell in reach for any requests

## 2019-09-26 NOTE — Progress Notes (Addendum)
[facility] [place] NAME: Vincent Poole    MR#:  [id number]  DATE OF BIRTH:  [birth year]  SUBJECTIVE:  seen in the emergency room. Wife at the bedside. Patient is doing well. Good lunch and breakfast. He is alert oriented times three. Wife thinks his back to baseline. No fever. Vitals remains stable. Denies any cough or chest pain.   REVIEW OF SYSTEMS:   Review of Systems  Constitutional: Negative for chills, fever and weight loss.  HENT: Negative for ear discharge, ear pain and nosebleeds.   Eyes: Negative for blurred vision, pain and discharge.  Respiratory: Negative for sputum production, shortness of breath, wheezing and stridor.   Cardiovascular: Negative for chest pain, palpitations, orthopnea and PND.  Gastrointestinal: Negative for abdominal pain, diarrhea, nausea and vomiting.  Genitourinary: Negative for frequency and urgency.  Musculoskeletal: Negative for back pain and joint pain.  Neurological: Positive for weakness. Negative for sensory change, speech change and focal weakness.  Psychiatric/Behavioral: Negative for depression and hallucinations. The patient is not nervous/anxious.    Tolerating Diet: yes Tolerating PT: walks with walker at home  DRUG ALLERGIES:  No Known Allergies  VITALS:  Blood pressure (!) 107/55, pulse 90, temperature 97.9 F (36.6 C), resp. rate 18, height 5\' 7"  (1.702 m), weight 67.6 kg, SpO2 99 %.  PHYSICAL EXAMINATION:   Physical Exam  GENERAL:  84 y.o.-year-old patient lying in the bed with no acute distress.  EYES: Pupils equal, round, reactive to light and accommodation. No scleral icterus. Extraocular muscles intact.  HEENT: Head atraumatic, normocephalic. Oropharynx and nasopharynx clear.  NECK:  Supple, no jugular venous distention. No thyroid enlargement, no tenderness.  LUNGS: Normal breath sounds bilaterally, no wheezing, rales, rhonchi. No use of accessory muscles of respiration.   CARDIOVASCULAR: S1, S2 normal. No murmurs, rubs, or gallops.  ABDOMEN: Soft, nontender, nondistended. Colostomy + bilateral nephrectomy tubes EXTREMITIES: No cyanosis, clubbing or edema b/l.    NEUROLOGIC: Cranial nerves II through XII are intact. No focal Motor or sensory deficits b/l.   PSYCHIATRIC:  patient is alert and oriented x 3.  SKIN: No obvious rash, lesion, or ulcer.   LABORATORY PANEL:  CBC Recent Labs  Lab 09/26/19 0519  WBC 47.7*  HGB 10.7*  HCT 34.5*  PLT 268    Chemistries  Recent Labs  Lab 09/26/19 0519  NA 141  K 3.8  CL 105  CO2 26  GLUCOSE 122*  BUN 22  CREATININE 1.08  CALCIUM 8.4*  AST 10*  ALT 10  ALKPHOS 101  BILITOT 0.4   Cardiac Enzymes No results for input(s): TROPONINI in the last 168 hours. RADIOLOGY:  Ct Head Wo Contrast  Result Date: 09/25/2019 CLINICAL DATA:  Seizure EXAM: CT HEAD WITHOUT CONTRAST TECHNIQUE: Contiguous axial images were obtained from the base of the skull through the vertex without intravenous contrast. COMPARISON:  None. FINDINGS: Brain: There is atrophy and chronic small vessel disease changes. No acute intracranial abnormality. Specifically, no hemorrhage, hydrocephalus, mass lesion, acute infarction, or significant intracranial injury. Vascular: No hyperdense vessel or unexpected calcification. Skull: No acute calvarial abnormality. Sinuses/Orbits: Visualized paranasal sinuses and mastoids clear. Orbital soft tissues unremarkable. Other: None IMPRESSION: Atrophy, chronic microvascular disease. No acute intracranial abnormality. Electronically Signed   By: Rolm Baptise M.D.   On: 09/25/2019 20:42   Ct Abdomen Pelvis W Contrast  Result Date: 09/25/2019 CLINICAL DATA:  Acute abdominal pain. Fever. EXAM: CT ABDOMEN AND PELVIS WITH CONTRAST TECHNIQUE: Multidetector  CT imaging of the abdomen and pelvis was performed using the standard protocol following bolus administration of intravenous contrast. CONTRAST:  120mL  OMNIPAQUE IOHEXOL 300 MG/ML  SOLN COMPARISON:  09/09/2019 FINDINGS: Lower chest: Aortic atherosclerosis. Extensive coronary artery calcifications. Heart size is normal. No pericardial effusion. Tiny bilateral pleural effusions with minimal bibasilar atelectasis. Hepatobiliary: No focal liver abnormality is seen. Status post cholecystectomy. No biliary dilatation. Pancreas: Unremarkable. No pancreatic ductal dilatation or surrounding inflammatory changes. Spleen: Normal in size without focal abnormality. Adrenals/Urinary Tract: Adrenal glands are normal. Bilateral nephrostomy tubes in place. Slight dilatation of the right renal pelvis and proximal right ureter. 21 mm simple cyst on the mid right kidney. Again noted is a large irregular inhomogeneous mass in the bladder which appears larger than on the prior study. There are irregular fluid collections containing gas in that region consistent with infection. Gas collection protrudes through the midline of the anterior abdominal wall as demonstrated on the prior exam. Stomach/Bowel: Large amount of stool in the rectum consistent with a fecal impaction. Colostomy in the mid transverse colon. Appendix is normal. No significant change since the prior study. Vascular/Lymphatic: Aortic atherosclerosis. No enlarged abdominal or pelvic lymph nodes. Reproductive: Prostate gland is not identified. Other: No ascites. Musculoskeletal: No acute abnormality. Severe arthritic changes of both hips, right greater than left. IMPRESSION: 1. Interval enlargement of the large irregular inhomogeneous mass in the bladder consistent with the patient's known bladder carcinoma. 2. Large amount of stool in the rectum consistent with fecal impaction, unchanged. 3. Bilateral nephrostomy tubes in place. Slight dilatation of the right renal pelvis and proximal right ureter. 4. Tiny bilateral pleural effusions with minimal bibasilar atelectasis. 5. Severe arthritic changes of both hips, right  greater than left. 6. Aortic Atherosclerosis (ICD10-I70.0). Electronically Signed   By: Lorriane Shire M.D.   On: 09/25/2019 20:47   Dg Chest Portable 1 View  Result Date: 09/25/2019 CLINICAL DATA:  Sepsis, mental status decline. EXAM: PORTABLE CHEST 1 VIEW COMPARISON:  Chest CT 08/08/2019 FINDINGS: Power injectable right Port-A-Cath noted, tip projecting over the SVC. Mild bilateral interstitial accentuation in both lungs with some hazy density in the infrahilar regions potentially from atypical pneumonia or noncardiogenic edema. No cardiomegaly. Atherosclerotic calcification of the aortic arch. No blunting of the costophrenic angles. IMPRESSION: 1. Hazy infrahilar airspace opacity in the lungs bilaterally, potentially from atypical pneumonia or noncardiogenic edema. 2. Atherosclerotic calcification of the aortic arch. 3. Power injectable right Port-A-Cath tip: SVC. Electronically Signed   By: Van Clines M.D.   On: 09/25/2019 20:19   ASSESSMENT AND PLAN:   Vincent Poole is a 84 y.o. male with history of locally advanced bladder cancer with diverting colostomy and bilateral nephrostomy being followed by oncologist.  Previous history of prostate cancer, DVT of the lower extremity history of anemia on iron and B12 supplements was found to be acutely confused with seizure-like activity per patient's family.  1. Sepsis on presentation in the ER source UTI and possible pneumonia (less likely since patient does not have any symptoms of pneumonia) -presented with hypotension altered mental status with lactic acid of 5.0 improved with IV fluids. -Patient has chronic bilateral nephrectomy tubes and has had previous UTIs due to his bladder carcinoma -currently on IV CEFEPIME -discontinue Flagyl and vancomycin -blood culture negative -COVID-19 negative -is afebrile and sats are 99% on room air -PROcalcitonin 0.33  -continue IV fluids  2. Acute encephalopathy with possible seizure/shakiness in  the setting of hypotension -CT head unremarkable -EEG  pending -no known history of seizures -continue seizure precautions and monitoring  3. History of locally advanced bladder cancer with diabetic colostomy and bilateral chronic nephrectomy followed by oncology and urology -currently getting chemo and radiation at the cancer center  4. History of anemia on iron and B12 supplements  5. Chronic leukocytosis likely related to cancer which is acutely worsened from possible low-grade infection -followed by oncology -?neulasta  6. history of DVT -- patient was on eliquis which was discontinued in August 2020 after 3 months of treatment  Patient is ambulatory at home using a walker. Will consider physical therapy.   Family communication : discussed with wife in the room Consults : Discharge Disposition : home CODE STATUS: DNR confirmed with wife DVT Prophylaxis : Lovenox  TOTAL TIME TAKING CARE OF THIS PATIENT: *35* minutes.  >50% time spent on counselling and coordination of care  POSSIBLE D/C IN *1-2* DAYS, DEPENDING ON CLINICAL CONDITION.  Note: This dictation was prepared with Dragon dictation along with smaller phrase technology. Any transcriptional errors that result from this process are unintentional.  Fritzi Mandes M.D on 09/26/2019 at 3:08 PM  Between 7am to 6pm - Pager - [phone]  After 6pm go to [email or web address]  [facility]   CC: Primary care physician; [name], MDPatient ID: Vincent Poole, male   DOB: 09/07/1935, 84 y.o.   MRN: [id number]

## 2019-09-27 ENCOUNTER — Inpatient Hospital Stay: Payer: Medicare Other

## 2019-09-27 DIAGNOSIS — G9340 Encephalopathy, unspecified: Secondary | ICD-10-CM

## 2019-09-27 MED ORDER — POLYETHYLENE GLYCOL 3350 17 G PO PACK: 17.0000 g | PACK | Freq: Every day | ORAL | Status: DC | PRN

## 2019-09-27 MED ORDER — SODIUM CHLORIDE 0.9% FLUSH
10.0000 mL | Freq: Two times a day (BID) | INTRAVENOUS | Status: DC
  Administered 2019-09-27 – 2019-09-28 (×3): 10 mL

## 2019-09-27 MED ORDER — SODIUM CHLORIDE 0.9% FLUSH: 10.0000 mL | INTRAVENOUS | Status: DC | PRN

## 2019-09-27 MED ORDER — ADULT MULTIVITAMIN W/MINERALS CH
1.0000 | ORAL_TABLET | Freq: Every day | ORAL | Status: DC
  Administered 2019-09-27 – 2019-09-28 (×2): 1 via ORAL
  Filled 2019-09-27 (×2): qty 1

## 2019-09-27 MED ORDER — OXYCODONE HCL 5 MG PO TABS
5.0000 mg | ORAL_TABLET | ORAL | Status: DC | PRN
  Administered 2019-09-27: 21:00:00 5 mg via ORAL
  Filled 2019-09-27: qty 1

## 2019-09-27 MED ORDER — SODIUM CHLORIDE 0.9 % IV SOLN
2.0000 g | INTRAVENOUS | Status: DC
  Administered 2019-09-27: 12:00:00 2 g via INTRAVENOUS
  Filled 2019-09-27: qty 2
  Filled 2019-09-27: qty 20

## 2019-09-27 NOTE — Progress Notes (Signed)
Notify NP Ouma about patient's 10 beats of Vtach, patient is asymptomatic, added BMP and magnesium for the morning. RN will continue to monitor.

## 2019-09-27 NOTE — Evaluation (Signed)
Physical Therapy Evaluation Patient Details Name: Vincent TROMPETER MRN: [id number] DOB: Nov 08, 1934 Today's Date: 09/27/2019   History of Present Illness  Patient is a 84 yr old male with locally advanced urothelial carcinoma, has chronic b/l nephrostomy tubes, diverting colostomy from malignant bowel obstruction and chronic leucocytosis. He was brought to ER for AMS, hypotension, fever. He is being treated for UTI. PMH includes DVT diagnosed 01/04/2019,  GERD, anxiety, arthrits, history of atrial flutter, R femur fracture, HTN, prostate cancer, umbilical hernia, wound eschar L of foot, urinary catheter, multi bladder surgeries/procedures.   Clinical Impression  Patient alert, in bed HOH, denied pain at rest, family at bedside. Wife provided PLOF information due to pt being HOH. At baseline pt ambulated household distances with a walker, assistance for lower body dressing and bird baths. Significant family support including working on walking/exercises with family.  The patient demonstrated supine to sit with minA. The patient was able to sit EOB with fair balance once midline was achieved. Sit <> stand from edge of bed minA with RW, and CGA from recliner with heavy use of UEs. The patient was able to take shuffling steps to recliner in the room with CGA and RW, pt fearful but able to complete. Pt also performed several exercises with verbal/tactile cues during session. Pt feeling fatigued/dizzy once in recliner, stated it was improving with time.  Overall the patient demonstrated deficits (see "PT Problem List") that impede the patient's functional abilities, safety, and mobility and would benefit from skilled PT intervention. Discharge recommendation is HHPT with supervision for mobility/OOB. PT and pt also discussed safe stair navigation for home entrance, reported understanding.  Pt orthostatic vitals assessed, WFLs and RN notified of results.     Follow Up Recommendations Home health  PT;Supervision for mobility/OOB    Equipment Recommendations  None recommended by PT;Other (comment)(pt has extensive DME at home)    Recommendations for Other Services       Precautions / Restrictions Precautions Precautions: Fall Precaution Comments: bilateral nephrostomy tubes, colostomy Restrictions Weight Bearing Restrictions: No      Mobility  Bed Mobility Overal bed mobility: Needs Assistance Bed Mobility: Supine to Sit     Supine to sit: HOB elevated;Min assist     General bed mobility comments: for successful weight shift  Transfers Overall transfer level: Needs assistance Equipment used: Rolling walker (2 wheeled) Transfers: Sit to/from Stand Sit to Stand: Min guard;Min assist         General transfer comment: minA from edge of bed. CGA from recliner with heavy use of UEs  Ambulation/Gait Ambulation/Gait assistance: Min guard Gait Distance (Feet): 5 Feet Assistive device: Rolling walker (2 wheeled)       General Gait Details: shuffled step, pt fearful but willing to participate  Stairs            Wheelchair Mobility    Modified Rankin (Stroke Patients Only)       Balance Overall balance assessment: Needs assistance Sitting-balance support: Feet supported Sitting balance-Leahy Scale: Fair       Standing balance-Leahy Scale: Poor                               Pertinent Vitals/Pain Pain Assessment: No/denies pain    Home Living Family/patient expects to be discharged to:: Private residence Living Arrangements: Spouse/significant other Available Help at Discharge: Family;Available 24 hours/day Type of Home: Mobile home Home Access: Stairs to enter Entrance Stairs-Rails: Left Entrance  Stairs-Number of Steps: 3 Home Layout: One level Home Equipment: Amagon - 2 wheels;Cane - single point;Wheelchair - [employer] - 4 wheels(lift chair)      Prior Function Level of Independence: Needs assistance   Gait /  Transfers Assistance Needed: transfers with assist from family to RW. Ambulates with assistance with RW.   ADL's / Homemaking Assistance Needed: patient requires help with dressing, bathing at times. Requires help with IADLs.   Comments: Pt.'s wife, son (home "24/7"), and daughter in law assists with meal preparation, medication management, and transportation. Patient sleeps in recliner. Pt's wife at bedside provided above information.     Hand Dominance   Dominant Hand: Right    Extremity/Trunk Assessment   Upper Extremity Assessment Upper Extremity Assessment: Generalized weakness    Lower Extremity Assessment Lower Extremity Assessment: Generalized weakness    Cervical / Trunk Assessment Cervical / Trunk Assessment: Normal  Communication   Communication: HOH  Cognition Arousal/Alertness: Awake/alert Behavior During Therapy: WFL for tasks assessed/performed Overall Cognitive Status: Within Functional Limits for tasks assessed                                        General Comments      Exercises Other Exercises Other Exercises: pt able to static stand for 1-2 minutes to assess BP with CGA and RW Other Exercises: Pt performed supine heel slides, SLR and hip abduction x5 ea LE Other Exercises: standing marching ~30seconds with CGA   Assessment/Plan    PT Assessment Patient needs continued PT services  PT Problem List Decreased strength;Decreased mobility;Decreased activity tolerance;Decreased balance       PT Treatment Interventions DME instruction;Therapeutic exercise;Gait training;Balance training;Stair training;Functional mobility training;Therapeutic activities;Patient/family education;Neuromuscular re-education    PT Goals (Current goals can be found in the Care Plan section)  Acute Rehab PT Goals Patient Stated Goal: to get better PT Goal Formulation: With patient Time For Goal Achievement: 10/11/19 Potential to Achieve Goals: Good     Frequency Min 2X/week   Barriers to discharge        Co-evaluation               AM-PAC PT "6 Clicks" Mobility  Outcome Measure Help needed turning from your back to your side while in a flat bed without using bedrails?: A Little Help needed moving from lying on your back to sitting on the side of a flat bed without using bedrails?: A Little Help needed moving to and from a bed to a chair (including a wheelchair)?: A Little Help needed standing up from a chair using your arms (e.g., wheelchair or bedside chair)?: A Little Help needed to walk in hospital room?: A Little Help needed climbing 3-5 steps with a railing? : A Lot 6 Click Score: 17    End of Session Equipment Utilized During Treatment: Gait belt Activity Tolerance: Patient tolerated treatment well Patient left: in chair;with chair alarm set;with family/visitor present;with call bell/phone within reach Nurse Communication: Mobility status PT Visit Diagnosis: Other abnormalities of gait and mobility (R26.89);Muscle weakness (generalized) (M62.81);Other symptoms and signs involving the nervous system (R29.898)    Time: 7048-8891 PT Time Calculation (min) (ACUTE ONLY): 36 min   Charges:   PT Evaluation $PT Eval Moderate Complexity: 1 Mod PT Treatments $Therapeutic Exercise: 8-22 mins       Lieutenant Diego PT, DPT 11:33 AM,09/27/19 [phone]

## 2019-09-27 NOTE — Progress Notes (Signed)
Pharmacy Antibiotic Note  Vincent Poole is a 84 y.o. male admitted on 09/25/2019 with sepsis.  Pharmacy has been consulted for cefepime dosing.  BCx 11/18 NG x 2 days  Plan: Vancomycin DC'ed  Will continue cefepime 2g IV q12h per CrCl 30 - 59 ml/min and will continue to monitor s/sx of infx and renal function and adjust as needed.  Height: 5\' 7"  (170.2 cm) Weight: 149 lb (67.6 kg) IBW/kg (Calculated) : 66.1  Temp (24hrs), Avg:98.1 F (36.7 C), Min:97.6 F (36.4 C), Max:98.7 F (37.1 C)  Recent Labs  Lab 09/20/19 1100 09/25/19 1920 09/25/19 1930 09/25/19 2120 09/26/19 0519  WBC 34.3* 46.9*  --   --  47.7*  CREATININE 0.94 1.07  --   --  1.08  LATICACIDVEN  --   --  5.0* 1.0  --     Estimated Creatinine Clearance: 49.3 mL/min (by C-G formula based on SCr of 1.08 mg/dL).    No Known Allergies  Thank you for allowing pharmacy to be a part of this patient's care.  Lu Duffel, PharmD, BCPS Clinical Pharmacist 09/27/2019 7:55 AM

## 2019-09-27 NOTE — TOC Initial Note (Signed)
Transition of Care [facility]) - Initial/Assessment Note    Patient Details  Name: Vincent Poole MRN: [id number] Date of Birth: 1935-05-29  Transition of [facility]) CM/SW Contact:    Candie Chroman, LCSW Phone Number: 09/27/2019, 9:59 AM  Clinical Narrative: Readmission prevention screen complete. CSW met with patient. No supports at bedside. CSW introduced role and explained that discharge planning would be discussed. Patient did not have any home health prior to admission. He uses a rolling walker at home. He lives with his wife, son, and daughter-in-law. Family drives him as needed. No issues obtaining medications. He stays up-to-date with his PCP. Patient went to [facility] 8/5-9/6. No further concerns. CSW encouraged patient to contact CSW as needed. CSW will continue to follow patient for support and facilitate return home when stable.                 Expected Discharge Plan: Home/Self Care Barriers to Discharge: Continued Medical Work up   Patient Goals and CMS Choice        Expected Discharge Plan and Services Expected Discharge Plan: Home/Self Care       Living arrangements for the past 2 months: Single Family Home                                      Prior Living Arrangements/Services Living arrangements for the past 2 months: Single Family Home Lives with:: Adult Children, Spouse Patient language and need for interpreter reviewed:: Yes Do you feel safe going back to the place where you live?: Yes      Need for Family Participation in Patient Care: Yes (Comment) Care giver support system in place?: Yes (comment) Current home services: DME Criminal Activity/Legal Involvement Pertinent to Current Situation/Hospitalization: No - Comment as needed  Activities of Daily Living Home Assistive Devices/Equipment: None ADL Screening (condition at time of admission) Patient's cognitive ability adequate to safely complete daily activities?: Yes Is the patient  deaf or have difficulty hearing?: Yes Does the patient have difficulty seeing, even when wearing glasses/contacts?: No Does the patient have difficulty concentrating, remembering, or making decisions?: No Patient able to express need for assistance with ADLs?: Yes Does the patient have difficulty dressing or bathing?: No Independently performs ADLs?: Yes (appropriate for developmental age) Does the patient have difficulty walking or climbing stairs?: No Weakness of Legs: None Weakness of Arms/Hands: None  Permission Sought/Granted                  Emotional Assessment Appearance:: Appears stated age Attitude/Demeanor/Rapport: Engaged, Gracious Affect (typically observed): Accepting, Appropriate, Calm, Pleasant Orientation: : Oriented to Self, Oriented to Place, Oriented to  Time, Oriented to Situation Alcohol / Substance Use: Never Used Psych Involvement: No (comment)  Admission diagnosis:  Sepsis with encephalopathy without septic shock, due to unspecified organism ([place]) [A41.9, R65.20, G93.40] Sepsis ([place]) [A41.9] Patient Active Problem List   Diagnosis Date Noted  . S/P partial resection of colon 07/17/2019  . Protein-calorie malnutrition, severe 06/11/2019  . Abdominal pain, generalized   . Palliative care by specialist   . Urothelial cancer ([place])   . Weakness   . Large bowel obstruction ([place]) 06/06/2019  . Hydronephrosis, right 04/08/2019  . Pressure injury of skin 03/23/2019  . SIRS (systemic inflammatory response syndrome) ([place]) 03/22/2019  . Leukocytosis 02/21/2019  . Attention to nephrostomy ([place]) 11/22/2018  . Complicated UTI (urinary tract infection) 11/14/2018  .  Palliative care encounter   . Iron deficiency anemia secondary to inadequate dietary iron intake 06/27/2018  . B12 deficiency 06/27/2018  . History of fracture of right hip 06/26/2018  . History of normocytic normochromic anemia 06/26/2018  . Personal history of bladder cancer 06/26/2018  . Hip  fracture ([place]) 05/01/2018  . Malignant neoplasm of urinary bladder ([place]) 01/12/2018  . Goals of care, counseling/discussion 01/12/2018  . History of recurrent UTIs 08/03/2017  . Pyelonephritis 05/31/2017  . UTI (urinary tract infection) 03/04/2017  . Urinary obstruction   . Essential hypertension 08/30/2016  . History of shingles 08/30/2016  . Prostate cancer ([place]) 08/30/2016  . Urinary retention 08/30/2016  . Medicare annual wellness visit, initial 08/01/2016  . Medicare annual wellness visit, subsequent 08/01/2016  . Sepsis ([place]) 07/11/2016  . Borderline diabetes mellitus 01/26/2016  . Vaccine counseling 01/26/2015  . Lumbar radiculitis 06/24/2014  . OA (osteoarthritis) of hip 06/24/2014  . Malignant neoplasm of prostate ([place]) 01/27/2011   PCP:  [name], MD Pharmacy:   [place], [address] [place] [address] Phone: [phone] Fax: [phone]  [place], [place] - [address] [place] [address] Phone: [phone] Fax: [phone]     Social Determinants of Health (SDOH) Interventions    Readmission Risk Interventions Readmission Risk Prevention Plan 09/27/2019 04/28/2019 03/26/2019  Transportation Screening Complete Complete -  PCP or Specialist Appt within 3-5 Days - Not Complete -  [place] or [place] - Complete -  Social Work Consult for [place] Planning/Counseling - Patient refused -  Palliative Care Screening - - Complete  Medication Review Press photographer) Complete Complete -  PCP or Specialist appointment within 3-5 days of discharge Complete - -  [place] or [place] (No Data) - -  SW Recovery Care/Counseling Consult Complete - -  Palliative Care Screening Not Applicable - -  [place] Not Applicable - -  Some recent data might be hidden

## 2019-09-27 NOTE — Progress Notes (Signed)
Ronn Melena, NP about patient's complaints of abdominal pain 7/10, asking for a stronger pain medication other than a tylenol, roxicodone ordered given. RN will continue to monitor.

## 2019-09-27 NOTE — Consult Note (Signed)
Hematology/Oncology Consult note [facility] Telephone:([phone] [phone]  Patient Care Team: [name], MD as PCP - General (Family Medicine)   Name of the patient: Vincent Poole  [id number]  05-10-1935    Reason for consult: metastatic urothelial carcinoma admitted for sepsis possibly due to UTI   Requesting physician: Dr. Posey Pronto  Date of visit: 09/27/2019    History of presenting illness- Patient is a 84 yr old male with locally advanced urothelial carcinoma most recently on padcev 2 weeks on 1 week off. He received cycle 3 day 1 on 11/13 and was supposed to get day 8 dose today. He has chronic b/l nephrostomy tubes, diverting colostomy from malignant bowel obstruction and chronic leucocytosis. He was brought to ER for AMS, hypotension, fever. He is being treated for possible UTI.  Currently Patient is AAOX3 and appears comfortable. Reports chronic fatigue but denies other complaints   ECOG PS- 2  Pain scale- 0   Review of systems- Review of Systems  Constitutional: Positive for malaise/fatigue. Negative for chills, fever and weight loss.  HENT: Negative for congestion, ear discharge and nosebleeds.   Eyes: Negative for blurred vision.  Respiratory: Negative for cough, hemoptysis, sputum production, shortness of breath and wheezing.   Cardiovascular: Negative for chest pain, palpitations, orthopnea and claudication.  Gastrointestinal: Negative for abdominal pain, blood in stool, constipation, diarrhea, heartburn, melena, nausea and vomiting.  Genitourinary: Negative for dysuria, flank pain, frequency, hematuria and urgency.  Musculoskeletal: Negative for back pain, joint pain and myalgias.  Skin: Negative for rash.  Neurological: Negative for dizziness, tingling, focal weakness, seizures, weakness and headaches.  Endo/Heme/Allergies: Does not bruise/bleed easily.  Psychiatric/Behavioral: Negative for depression and suicidal  ideas. The patient does not have insomnia.     No Known Allergies  Patient Active Problem List   Diagnosis Date Noted   S/P partial resection of colon 07/17/2019   Protein-calorie malnutrition, severe 06/11/2019   Abdominal pain, generalized    Palliative care by specialist    Urothelial cancer ([place])    Weakness    Large bowel obstruction ([place]) 06/06/2019   Hydronephrosis, right 04/08/2019   Pressure injury of skin 03/23/2019   SIRS (systemic inflammatory response syndrome) ([place]) 03/22/2019   Leukocytosis 02/21/2019   Attention to nephrostomy ([place]) 46/50/3546   Complicated UTI (urinary tract infection) 11/14/2018   Palliative care encounter    Iron deficiency anemia secondary to inadequate dietary iron intake 06/27/2018   B12 deficiency 06/27/2018   History of fracture of right hip 06/26/2018   History of normocytic normochromic anemia 06/26/2018   Personal history of bladder cancer 06/26/2018   Hip fracture ([place]) 05/01/2018   Malignant neoplasm of urinary bladder ([place]) 01/12/2018   Goals of care, counseling/discussion 01/12/2018   History of recurrent UTIs 08/03/2017   Pyelonephritis 05/31/2017   UTI (urinary tract infection) 03/04/2017   Urinary obstruction    Essential hypertension 08/30/2016   History of shingles 08/30/2016   Prostate cancer ([place]) 08/30/2016   Urinary retention 08/30/2016   Medicare annual wellness visit, initial 08/01/2016   Medicare annual wellness visit, subsequent 08/01/2016   Sepsis ([place]) 07/11/2016   Borderline diabetes mellitus 01/26/2016   Vaccine counseling 01/26/2015   Lumbar radiculitis 06/24/2014   OA (osteoarthritis) of hip 06/24/2014   Malignant neoplasm of prostate ([place]) 01/27/2011     Past Medical History:  Diagnosis Date   Anxiety    Arthritis    Bladder cancer ([place])    Dysrhythmia 07/2018   history of  atrial flutter that worsens with anxiety   Femur fracture, right ([place]) 05/01/2018    GERD (gastroesophageal reflux disease)    History of recent blood transfusion 05/2018   Hypertension    Iron deficiency anemia 09/14/2018   Prostate cancer ([place]) 07/2018   cancer growing in prostate but not prostate cancer, it is from the bladder   Umbilical hernia 62/8366   Urinary retention 2019   foley catheter place 11/2017   UTI (urinary tract infection) 2019   frequent UTI's over last year   Wound eschar of foot 07/2018   left heal getting wrapped and requiring antibiotic cream. cracks open with weight bearing.     Past Surgical History:  Procedure Laterality Date   CARPAL TUNNEL RELEASE Right    CHOLECYSTECTOMY  2004   COLOSTOMY N/A 06/08/2019   Procedure: COLOSTOMY;  Surgeon: Herbert Pun, MD;  Location: [facility];  Service: General;  Laterality: N/A;   CYSTOGRAM  07/18/2018   Procedure: CYSTOGRAM;  Surgeon: [name], MD;  Location: [facility];  Service: Urology;;   Consuela Mimes W/ RETROGRADES Bilateral 07/18/2018   Procedure: CYSTOSCOPY WITH RETROGRADE PYELOGRAM;  Surgeon: [name], MD;  Location: [facility];  Service: Urology;  Laterality: Bilateral;   CYSTOSCOPY W/ URETERAL STENT PLACEMENT Bilateral 12/27/2017   Procedure: CYSTOSCOPY WITH RETROGRADE PYELOGRAM/URETERAL STENT PLACEMENT;  Surgeon: [name], MD;  Location: [facility];  Service: Urology;  Laterality: Bilateral;   CYSTOSCOPY W/ URETERAL STENT PLACEMENT Bilateral 07/18/2018   Procedure: CYSTOSCOPY WITH STENT REPLACEMENT (exchange);  Surgeon: [name], MD;  Location: [facility];  Service: Urology;  Laterality: Bilateral;   CYSTOSCOPY WITH STENT PLACEMENT Bilateral 11/12/2018   Procedure: [place] WITH STENT Exchange;  Surgeon: [name], MD;  Location: [facility];  Service: Urology;  Laterality: Bilateral;   INTRAMEDULLARY (IM) NAIL INTERTROCHANTERIC Right 05/02/2018   Procedure: INTRAMEDULLARY (IM) NAIL INTERTROCHANTRIC;  Surgeon: [name], MD;  Location: [facility];   Service: Orthopedics;  Laterality: Right;   IR NEPHROSTOMY EXCHANGE LEFT  03/25/2019   IR NEPHROSTOMY EXCHANGE LEFT  04/19/2019   IR NEPHROSTOMY EXCHANGE LEFT  05/31/2019   IR NEPHROSTOMY EXCHANGE LEFT  07/19/2019   IR NEPHROSTOMY EXCHANGE LEFT  09/13/2019   IR NEPHROSTOMY EXCHANGE RIGHT  03/25/2019   IR NEPHROSTOMY EXCHANGE RIGHT  04/19/2019   IR NEPHROSTOMY EXCHANGE RIGHT  05/31/2019   IR NEPHROSTOMY EXCHANGE RIGHT  07/19/2019   IR NEPHROSTOMY EXCHANGE RIGHT  09/13/2019   IR NEPHROSTOMY PLACEMENT LEFT  02/23/2019   IR NEPHROSTOMY PLACEMENT RIGHT  02/23/2019   LAPAROTOMY N/A 06/08/2019   Procedure: EXPLORATORY LAPAROTOMY;  Surgeon: Herbert Pun, MD;  Location: [facility];  Service: General;  Laterality: N/A;   LEG TENDON SURGERY Right 1958   PORTA CATH INSERTION N/A 01/22/2018   Procedure: PORTA CATH INSERTION;  Surgeon: [name], MD;  Location: [place] [facility];  Service: Cardiovascular;  Laterality: N/A;   TRANSURETHRAL RESECTION OF BLADDER TUMOR N/A 12/27/2017   Procedure: TRANSURETHRAL RESECTION OF BLADDER TUMOR (TURBT);  Surgeon: [name], MD;  Location: [facility];  Service: Urology;  Laterality: N/A;   TRANSURETHRAL RESECTION OF BLADDER TUMOR N/A 01/15/2018   Procedure: TRANSURETHRAL RESECTION OF BLADDER TUMOR (TURBT);  Surgeon: [name], MD;  Location: [facility];  Service: Urology;  Laterality: N/A;  Need 2 hrs for this case please   TRANSURETHRAL RESECTION OF BLADDER TUMOR N/A 07/18/2018   Procedure: TRANSURETHRAL RESECTION OF BLADDER TUMOR (TURBT);  Surgeon: [name], MD;  Location: [facility];  Service: Urology;  Laterality: N/A;    Social History   Socioeconomic History   Marital status: Married    Spouse name: ruth   Number of children: Not on file   Years of education: Not on file   Highest education level: Not on file  Occupational History   Occupation: retired    Comment: Barista  strain: Patient refused   Food insecurity    Worry: Patient refused    Inability: Patient refused   Transportation needs    Medical: Patient refused    Non-medical: Patient refused  Tobacco Use   Smoking status: Never Smoker   Smokeless tobacco: Never Used  Substance and Sexual Activity   Alcohol use: No    Alcohol/week: 0.0 standard drinks   Drug use: No   Sexual activity: Not Currently  Lifestyle   Physical activity    Days per week: Patient refused    Minutes per session: Patient refused   Stress: Patient refused  Relationships   Social connections    Talks on phone: Patient refused    Gets together: Patient refused    Attends religious service: Patient refused    Active member of club or organization: Patient refused    Attends meetings of clubs or organizations: Patient refused    Relationship status: Patient refused   Intimate partner violence    Fear of current or ex partner: Patient refused    Emotionally abused: Patient refused    Physically abused: Patient refused    Forced sexual activity: Patient refused  Other Topics Concern   Not on file  Social History Narrative   Not on file     Family History  Problem Relation Age of Onset   Cancer Mother    Chronic Renal Failure Mother    Heart disease Father      Current Facility-Administered Medications:    acetaminophen (TYLENOL) tablet 650 mg, 650 mg, Oral, Q6H PRN **OR** acetaminophen (TYLENOL) suppository 650 mg, 650 mg, Rectal, Q6H PRN, Rise Patience, MD   bisacodyl (DULCOLAX) suppository 10 mg, 10 mg, Rectal, Daily, Fritzi Mandes, MD, 10 mg at 09/26/19 2042   ceFEPIme (MAXIPIME) 2 g in sodium chloride 0.9 % 100 mL IVPB, 2 g, Intravenous, Q12H, Rise Patience, MD, Last Rate: 200 mL/hr at 09/26/19 2043, 2 g at 09/26/19 2043   Chlorhexidine Gluconate Cloth 2 % PADS 6 each, 6 each, Topical, Daily, Rise Patience, MD   enoxaparin (LOVENOX) injection 40 mg, 40 mg,  Subcutaneous, Q24H, Rise Patience, MD, 40 mg at 09/27/19 0120   feeding supplement (ENSURE ENLIVE) (ENSURE ENLIVE) liquid 237 mL, 237 mL, Oral, BID BM, Rise Patience, MD   ferrous sulfate tablet 325 mg, 325 mg, Oral, BID WC, Rise Patience, MD, 325 mg at 09/26/19 1732   mirabegron ER (MYRBETRIQ) tablet 50 mg, 50 mg, Oral, Daily, Rise Patience, MD   ondansetron (ZOFRAN) tablet 4 mg, 4 mg, Oral, Q6H PRN **OR** ondansetron (ZOFRAN) injection 4 mg, 4 mg, Intravenous, Q6H PRN, Rise Patience, MD   sodium chloride flush (NS) 0.9 % injection 10-40 mL, 10-40 mL, Intracatheter, Q12H, Posey Pronto, Sona, MD   sodium chloride flush (NS) 0.9 % injection 10-40 mL, 10-40 mL, Intracatheter, PRN, Fritzi Mandes, MD   vitamin B-12 (CYANOCOBALAMIN) tablet 500 mcg, 500 mcg, Oral, Daily, Rise Patience, MD   Physical exam:  Vitals:   09/26/19 1300 09/26/19 1357 09/26/19 1951 09/27/19 0637  BP: (!) 121/59 Marland Kitchen)  107/55 (!) 93/50 (!) 111/58  Pulse: 87 90 99 92  Resp: (!) 26 18 19 17   Temp:  97.9 F (36.6 C) 98.3 F (36.8 C) 98.7 F (37.1 C)  TempSrc:   Oral Oral  SpO2: 100% 99% 95% 96%  Weight:      Height:       Physical Exam Constitutional:      Comments: Elderly frail gentleman in no acute distress.   HENT:     Head: Normocephalic and atraumatic.  Eyes:     Pupils: Pupils are equal, round, and reactive to light.  Neck:     Musculoskeletal: Normal range of motion.  Cardiovascular:     Rate and Rhythm: Normal rate and regular rhythm.     Heart sounds: Normal heart sounds.  Pulmonary:     Effort: Pulmonary effort is normal.     Breath sounds: Normal breath sounds.  Abdominal:     General: Bowel sounds are normal.     Palpations: Abdomen is soft.     Comments: B/l nephrostomy tubes in place. Also has diverting colostomy with brown stool noted. Redness and firm mass over supra pubic area unchanged  Skin:    General: Skin is warm and dry.  Neurological:      Mental Status: He is alert and oriented to person, place, and time.        CMP Latest Ref Rng & Units 09/26/2019  Glucose 70 - 99 mg/dL 122(H)  BUN 8 - 23 mg/dL 22  Creatinine 0.61 - 1.24 mg/dL 1.08  Sodium 135 - 145 mmol/L 141  Potassium 3.5 - 5.1 mmol/L 3.8  Chloride 98 - 111 mmol/L 105  CO2 22 - 32 mmol/L 26  Calcium 8.9 - 10.3 mg/dL 8.4(L)  Total Protein 6.5 - 8.1 g/dL 5.2(L)  Total Bilirubin 0.3 - 1.2 mg/dL 0.4  Alkaline Phos 38 - 126 U/L 101  AST 15 - 41 U/L 10(L)  ALT 0 - 44 U/L 10   CBC Latest Ref Rng & Units 09/26/2019  WBC 4.0 - 10.5 K/uL 47.7(H)  Hemoglobin 13.0 - 17.0 g/dL 10.7(L)  Hematocrit 39.0 - 52.0 % 34.5(L)  Platelets 150 - 400 K/uL 268    @IMAGES @  Ct Head Wo Contrast  Result Date: 09/25/2019 CLINICAL DATA:  Seizure EXAM: CT HEAD WITHOUT CONTRAST TECHNIQUE: Contiguous axial images were obtained from the base of the skull through the vertex without intravenous contrast. COMPARISON:  None. FINDINGS: Brain: There is atrophy and chronic small vessel disease changes. No acute intracranial abnormality. Specifically, no hemorrhage, hydrocephalus, mass lesion, acute infarction, or significant intracranial injury. Vascular: No hyperdense vessel or unexpected calcification. Skull: No acute calvarial abnormality. Sinuses/Orbits: Visualized paranasal sinuses and mastoids clear. Orbital soft tissues unremarkable. Other: None IMPRESSION: Atrophy, chronic microvascular disease. No acute intracranial abnormality. Electronically Signed   By: Rolm Baptise M.D.   On: 09/25/2019 20:42   Ct Abdomen Pelvis W Contrast  Result Date: 09/25/2019 CLINICAL DATA:  Acute abdominal pain. Fever. EXAM: CT ABDOMEN AND PELVIS WITH CONTRAST TECHNIQUE: Multidetector CT imaging of the abdomen and pelvis was performed using the standard protocol following bolus administration of intravenous contrast. CONTRAST:  124mL OMNIPAQUE IOHEXOL 300 MG/ML  SOLN COMPARISON:  09/09/2019 FINDINGS: Lower chest:  Aortic atherosclerosis. Extensive coronary artery calcifications. Heart size is normal. No pericardial effusion. Tiny bilateral pleural effusions with minimal bibasilar atelectasis. Hepatobiliary: No focal liver abnormality is seen. Status post cholecystectomy. No biliary dilatation. Pancreas: Unremarkable. No pancreatic ductal dilatation or surrounding inflammatory changes.  Spleen: Normal in size without focal abnormality. Adrenals/Urinary Tract: Adrenal glands are normal. Bilateral nephrostomy tubes in place. Slight dilatation of the right renal pelvis and proximal right ureter. 21 mm simple cyst on the mid right kidney. Again noted is a large irregular inhomogeneous mass in the bladder which appears larger than on the prior study. There are irregular fluid collections containing gas in that region consistent with infection. Gas collection protrudes through the midline of the anterior abdominal wall as demonstrated on the prior exam. Stomach/Bowel: Large amount of stool in the rectum consistent with a fecal impaction. Colostomy in the mid transverse colon. Appendix is normal. No significant change since the prior study. Vascular/Lymphatic: Aortic atherosclerosis. No enlarged abdominal or pelvic lymph nodes. Reproductive: Prostate gland is not identified. Other: No ascites. Musculoskeletal: No acute abnormality. Severe arthritic changes of both hips, right greater than left. IMPRESSION: 1. Interval enlargement of the large irregular inhomogeneous mass in the bladder consistent with the patient's known bladder carcinoma. 2. Large amount of stool in the rectum consistent with fecal impaction, unchanged. 3. Bilateral nephrostomy tubes in place. Slight dilatation of the right renal pelvis and proximal right ureter. 4. Tiny bilateral pleural effusions with minimal bibasilar atelectasis. 5. Severe arthritic changes of both hips, right greater than left. 6. Aortic Atherosclerosis (ICD10-I70.0). Electronically Signed   By:  Lorriane Shire M.D.   On: 09/25/2019 20:47   Ct Abdomen Pelvis W Contrast  Result Date: 09/09/2019 CLINICAL DATA:  Restaging prostate cancer and urothelial carcinoma diagnosed 1 year ago. History of bilateral nephrostomies, partial colon resection and cholecystectomy. Reported area of erythema and swelling in the abdominal wall. EXAM: CT ABDOMEN AND PELVIS WITH CONTRAST TECHNIQUE: Multidetector CT imaging of the abdomen and pelvis was performed using the standard protocol following bolus administration of intravenous contrast. CONTRAST:  119mL OMNIPAQUE IOHEXOL 300 MG/ML  SOLN COMPARISON:  CT 08/08/2019 and 06/06/2019. FINDINGS: Lower chest: Stable mild emphysematous changes and scarring at both lung bases. The small right pleural effusion noted previously has resolved. Hepatobiliary: The liver is normal in density without suspicious focal abnormality. No significant biliary dilatation post cholecystectomy. Pancreas: Unremarkable. No pancreatic ductal dilatation or surrounding inflammatory changes. Spleen: Normal in size without focal abnormality. Adrenals/Urinary Tract: The adrenal glands appear stable without suspicious findings. Bilateral percutaneous nephrostomies remain in place. There is bilateral renal cortical thinning with contrast excretion into the renal collecting systems on the delayed images. There is a stable cyst anteriorly in the mid right kidney. No hydronephrosis. Grossly abnormal urinary bladder again noted with irregular bladder wall thickening and enhancement. There are solid enhancing components measuring 4.5 x 2.9 cm on image 69/2 and 4.5 x 3.0 cm on image 72/2. There are internal air-fluid levels within the bladder lumen with an enlarging diverticular like structure herniating between the rectus abdominus muscles. This now measures up to 7.7 x 7.0 cm on image 65/2 (previously 4.9 x 5.2 cm). Stomach/Bowel: No evidence of bowel wall thickening, distention or surrounding inflammatory  change. There is a distal transverse colostomy. The appendix appears normal. A large amount of stool is present in the rectum. Vascular/Lymphatic: There are no enlarged abdominal or pelvic lymph nodes. Aortic and branch vessel atherosclerosis without acute vascular findings. Reproductive: Previous trans urethral resection of the prostate gland. Irregular enhancing nodularity along the bladder base, similar to previous study. Other: Diffuse soft tissue stranding and ill-defined fat planes along the pelvic sidewalls, similar to previous study. No ascites or free air. No focal inflammatory changes are identified within  the anterior abdominal wall related to the colostomy or enlarging diverticulum projecting anteriorly from the bladder through the lower anterior abdominal wall. Musculoskeletal: No acute osseous findings or evidence of bone destruction. There are degenerative changes throughout the spine with ankylosis of the L1 through L3 vertebral bodies. There are moderately advanced degenerative changes at both hip status post proximal right femoral ORIF. IMPRESSION: 1. Persistent abnormal appearance of the urinary bladder status post trans urethral resection of the prostate gland. There is persistent irregular enhancing nodularity along the bladder base, suspicious for residual/recurrent bladder cancer. 2. Enlarging diverticular-like structure extending anteriorly from the bladder and herniating between the rectus abdominus muscles and the lower anterior abdominal wall. Again, this could be related to tumor and bladder outlet obstruction versus tissue necrosis/infection. 3. No evidence of distant metastatic disease. 4. Bilateral percutaneous nephrostomies remain in place. No hydronephrosis. 5. Aortic Atherosclerosis (ICD10-I70.0). Electronically Signed   By: Richardean Sale M.D.   On: 09/09/2019 13:29   Dg Chest Portable 1 View  Result Date: 09/25/2019 CLINICAL DATA:  Sepsis, mental status decline. EXAM:  PORTABLE CHEST 1 VIEW COMPARISON:  Chest CT 08/08/2019 FINDINGS: Power injectable right Port-A-Cath noted, tip projecting over the SVC. Mild bilateral interstitial accentuation in both lungs with some hazy density in the infrahilar regions potentially from atypical pneumonia or noncardiogenic edema. No cardiomegaly. Atherosclerotic calcification of the aortic arch. No blunting of the costophrenic angles. IMPRESSION: 1. Hazy infrahilar airspace opacity in the lungs bilaterally, potentially from atypical pneumonia or noncardiogenic edema. 2. Atherosclerotic calcification of the aortic arch. 3. Power injectable right Port-A-Cath tip: SVC. Electronically Signed   By: Van Clines M.D.   On: 09/25/2019 20:19   Ir Nephrostomy Exchange Left  Result Date: 09/13/2019 CLINICAL DATA:  Bladder carcinoma. Long term indwelling bilateral nephrostomy catheters, presents for scheduled exchange. No current problems with drain catheters. EXAM: BILATERAL PERCUTANEOUS NEPHROSTOMY CATHETER EXCHANGE UNDER FLUOROSCOPY TECHNIQUE: The procedure, risks (including but not limited to bleeding, infection, organ damage ), benefits, and alternatives were explained to the patient and spouse. Questions regarding the procedure were encouraged and answered. The patient understands and consents to the procedure. The nephrostomy tubes and surrounding skin were prepped with Betadine, draped in usual sterile fashion. 1% lidocaine subcutaneous placed around both skin entry sites. A small amount of contrast was injected through the left nephrostomy catheter to opacify the renal collecting system. The catheter was cut and exchanged over a 0.035" angiographic wire for a new 12 pigtail catheter, formed centrally within the collecting system under fluoroscopy. Contrast injection confirms appropriate positioning. In a similar fashion, the right nephrostomy catheter was injected, cut, and exchanged for a new 12 pigtail catheter, formed centrally within  the right renal collecting system. Injection confirms appropriate positioning and patency. Both catheters were secured externally with a Statlock devices and 0 Prolene sutures. The patient tolerated the procedure well. FLUOROSCOPY TIME:  1.2 minute; 4 5 uGym2 DAP COMPLICATIONS: None immediate IMPRESSION: 1. Technically successful exchange of bilateral nephrostomy catheters under fluoroscopy Electronically Signed   By: Lucrezia Europe M.D.   On: 09/13/2019 09:50   Ir Nephrostomy Exchange Right  Result Date: 09/13/2019 CLINICAL DATA:  Bladder carcinoma. Long term indwelling bilateral nephrostomy catheters, presents for scheduled exchange. No current problems with drain catheters. EXAM: BILATERAL PERCUTANEOUS NEPHROSTOMY CATHETER EXCHANGE UNDER FLUOROSCOPY TECHNIQUE: The procedure, risks (including but not limited to bleeding, infection, organ damage ), benefits, and alternatives were explained to the patient and spouse. Questions regarding the procedure were encouraged and answered. The  patient understands and consents to the procedure. The nephrostomy tubes and surrounding skin were prepped with Betadine, draped in usual sterile fashion. 1% lidocaine subcutaneous placed around both skin entry sites. A small amount of contrast was injected through the left nephrostomy catheter to opacify the renal collecting system. The catheter was cut and exchanged over a 0.035" angiographic wire for a new 12 pigtail catheter, formed centrally within the collecting system under fluoroscopy. Contrast injection confirms appropriate positioning. In a similar fashion, the right nephrostomy catheter was injected, cut, and exchanged for a new 12 pigtail catheter, formed centrally within the right renal collecting system. Injection confirms appropriate positioning and patency. Both catheters were secured externally with a Statlock devices and 0 Prolene sutures. The patient tolerated the procedure well. FLUOROSCOPY TIME:  1.2 minute; 4 5  uGym2 DAP COMPLICATIONS: None immediate IMPRESSION: 1. Technically successful exchange of bilateral nephrostomy catheters under fluoroscopy Electronically Signed   By: Lucrezia Europe M.D.   On: 09/13/2019 09:50    Assessment and plan- Patient is a 84 y.o. male with locally advanced urothelial carcinoma admitted for sepsis and acute encephalopathy possibly due to UTI  1. Sepsis secondary to UTI- lactic acid has now normalized.afebrile since this morning. On cefepime. Blood cultures negative so far. If he remains hemodynamically stable and afebrile over next 24- 48 hours, it would be ok to discharge him on oral antibiotics.  2. Leucocytosis: chronic. Wbc usually fluctuates between 20's-30's. Likely secondary to malignancy. Now acutely worse due to infection. He has not received neulasta  3. Anemia: chronic. Secondary to bladder cancer/chronic disease. Continue to monitor  4. RLE DVT: continue eliquis  5. Locally advanced urothelial carcinoma- patient and family understand that his overall prognosis is poor. I would like to give him 2 more cycles to see if he would have any meaningful response. If he has progression on padcev and or further decline in his PS, hospice would be appropriate. He is a DNR. He does have local progression of his bladder tumor and the suprapubic mass is enlarging tumor causing pain. He has seen rad onc and will be receiving palliative RT as an outpatient   Will follow.    Visit Diagnosis 1. Sepsis with encephalopathy without septic shock, due to unspecified organism [facility])     Dr. Randa Evens, MD, MPH [facility] [phone] 09/27/2019  8:01 AM

## 2019-09-27 NOTE — Progress Notes (Addendum)
[place] [facility] [place] NAME: Vincent Poole    MR#:  [id number]  DATE OF BIRTH:  11-Jul-1935  SUBJECTIVE:  patient working with physical therapy. He is doing well. He ate almost all of his breakfast. Wife in the room. No fever. Wife says he's back to baseline. Patient overall feels good  REVIEW OF SYSTEMS:   Review of Systems  Constitutional: Negative for chills, fever and weight loss.  HENT: Negative for ear discharge, ear pain and nosebleeds.   Eyes: Negative for blurred vision, pain and discharge.  Respiratory: Negative for sputum production, shortness of breath, wheezing and stridor.   Cardiovascular: Negative for chest pain, palpitations, orthopnea and PND.  Gastrointestinal: Negative for abdominal pain, diarrhea, nausea and vomiting.  Genitourinary: Negative for frequency and urgency.  Musculoskeletal: Negative for back pain and joint pain.  Neurological: Positive for weakness. Negative for sensory change, speech change and focal weakness.  Psychiatric/Behavioral: Negative for depression and hallucinations. The patient is not nervous/anxious.    Tolerating Diet: yes Tolerating PT: walks with walker at home  DRUG ALLERGIES:  No Known Allergies  VITALS:  Blood pressure 108/62, pulse (!) 101, temperature 98.5 F (36.9 C), temperature source Oral, resp. rate 18, height 5\' 7"  (1.702 m), weight 67.6 kg, SpO2 96 %.  PHYSICAL EXAMINATION:   Physical Exam  GENERAL:  84 y.o.-year-old patient lying in the bed with no acute distress.   EYES: Pupils equal, round, reactive to light and accommodation. No scleral icterus. Extraocular muscles intact.  HEENT: Head atraumatic, normocephalic. Oropharynx and nasopharynx clear.  NECK:  Supple, no jugular venous distention. No thyroid enlargement, no tenderness.  LUNGS: Normal breath sounds bilaterally, no wheezing, rales, rhonchi. No use of accessory muscles of respiration.  CARDIOVASCULAR: S1, S2  normal. No murmurs, rubs, or gallops.  ABDOMEN: Soft, nontender, nondistended. Colostomy + bilateral nephrectomy tubes EXTREMITIES: No cyanosis, clubbing or edema b/l.    NEUROLOGIC: Cranial nerves II through XII are intact. No focal Motor or sensory deficits b/l.   PSYCHIATRIC:  patient is alert and oriented x 3.  SKIN: No obvious rash, lesion, or ulcer.   LABORATORY PANEL:  CBC Recent Labs  Lab 09/26/19 0519  WBC 47.7*  HGB 10.7*  HCT 34.5*  PLT 268    Chemistries  Recent Labs  Lab 09/26/19 0519  NA 141  K 3.8  CL 105  CO2 26  GLUCOSE 122*  BUN 22  CREATININE 1.08  CALCIUM 8.4*  AST 10*  ALT 10  ALKPHOS 101  BILITOT 0.4   Cardiac Enzymes No results for input(s): TROPONINI in the last 168 hours. RADIOLOGY:  Ct Head Wo Contrast  Result Date: 09/25/2019 CLINICAL DATA:  Seizure EXAM: CT HEAD WITHOUT CONTRAST TECHNIQUE: Contiguous axial images were obtained from the base of the skull through the vertex without intravenous contrast. COMPARISON:  None. FINDINGS: Brain: There is atrophy and chronic small vessel disease changes. No acute intracranial abnormality. Specifically, no hemorrhage, hydrocephalus, mass lesion, acute infarction, or significant intracranial injury. Vascular: No hyperdense vessel or unexpected calcification. Skull: No acute calvarial abnormality. Sinuses/Orbits: Visualized paranasal sinuses and mastoids clear. Orbital soft tissues unremarkable. Other: None IMPRESSION: Atrophy, chronic microvascular disease. No acute intracranial abnormality. Electronically Signed   By: Rolm Baptise M.D.   On: 09/25/2019 20:42   Ct Abdomen Pelvis W Contrast  Result Date: 09/25/2019 CLINICAL DATA:  Acute abdominal pain. Fever. EXAM: CT ABDOMEN AND PELVIS WITH CONTRAST TECHNIQUE: Multidetector CT imaging of the abdomen  and pelvis was performed using the standard protocol following bolus administration of intravenous contrast. CONTRAST:  166mL OMNIPAQUE IOHEXOL 300 MG/ML   SOLN COMPARISON:  09/09/2019 FINDINGS: Lower chest: Aortic atherosclerosis. Extensive coronary artery calcifications. Heart size is normal. No pericardial effusion. Tiny bilateral pleural effusions with minimal bibasilar atelectasis. Hepatobiliary: No focal liver abnormality is seen. Status post cholecystectomy. No biliary dilatation. Pancreas: Unremarkable. No pancreatic ductal dilatation or surrounding inflammatory changes. Spleen: Normal in size without focal abnormality. Adrenals/Urinary Tract: Adrenal glands are normal. Bilateral nephrostomy tubes in place. Slight dilatation of the right renal pelvis and proximal right ureter. 21 mm simple cyst on the mid right kidney. Again noted is a large irregular inhomogeneous mass in the bladder which appears larger than on the prior study. There are irregular fluid collections containing gas in that region consistent with infection. Gas collection protrudes through the midline of the anterior abdominal wall as demonstrated on the prior exam. Stomach/Bowel: Large amount of stool in the rectum consistent with a fecal impaction. Colostomy in the mid transverse colon. Appendix is normal. No significant change since the prior study. Vascular/Lymphatic: Aortic atherosclerosis. No enlarged abdominal or pelvic lymph nodes. Reproductive: Prostate gland is not identified. Other: No ascites. Musculoskeletal: No acute abnormality. Severe arthritic changes of both hips, right greater than left. IMPRESSION: 1. Interval enlargement of the large irregular inhomogeneous mass in the bladder consistent with the patient's known bladder carcinoma. 2. Large amount of stool in the rectum consistent with fecal impaction, unchanged. 3. Bilateral nephrostomy tubes in place. Slight dilatation of the right renal pelvis and proximal right ureter. 4. Tiny bilateral pleural effusions with minimal bibasilar atelectasis. 5. Severe arthritic changes of both hips, right greater than left. 6. Aortic  Atherosclerosis (ICD10-I70.0). Electronically Signed   By: Lorriane Shire M.D.   On: 09/25/2019 20:47   Dg Chest Portable 1 View  Result Date: 09/25/2019 CLINICAL DATA:  Sepsis, mental status decline. EXAM: PORTABLE CHEST 1 VIEW COMPARISON:  Chest CT 08/08/2019 FINDINGS: Power injectable right Port-A-Cath noted, tip projecting over the SVC. Mild bilateral interstitial accentuation in both lungs with some hazy density in the infrahilar regions potentially from atypical pneumonia or noncardiogenic edema. No cardiomegaly. Atherosclerotic calcification of the aortic arch. No blunting of the costophrenic angles. IMPRESSION: 1. Hazy infrahilar airspace opacity in the lungs bilaterally, potentially from atypical pneumonia or noncardiogenic edema. 2. Atherosclerotic calcification of the aortic arch. 3. Power injectable right Port-A-Cath tip: SVC. Electronically Signed   By: Van Clines M.D.   On: 09/25/2019 20:19   ASSESSMENT AND PLAN:   JAMIEON LANNEN is a 84 y.o. male with history of locally advanced bladder cancer with diverting colostomy and bilateral nephrostomy being followed by oncologist.  Previous history of prostate cancer, DVT of the lower extremity history of anemia on iron and B12 supplements was found to be acutely confused with seizure-like activity per patient's family.  1. Sepsis on presentation in the ER source suspected UTI with bilateral nephrectomy tubes -sepsis now resolved -presented with hypotension altered mental status with lactic acid of 5.0 improved with IV fluids. -lactic acid down to 1.0 -Patient has chronic bilateral nephrectomy tubes and has had previous UTIs due to his bladder carcinoma -currently on IV CEFEPIME--change to IV rocephin and po abx as out pt -discontinue Flagyl and vancomycin -blood culture negative -COVID-19 negative -is afebrile and sats are 99% on room air -PROcalcitonin 0.33  -recieved IV fluids  2. Acute encephalopathy with possible  seizure/shakiness in the setting of hypotension -CT  head unremarkable -EEG diffuse slowing however no epileptiform activity noted -no known history of seizures -continue seizure precautions and monitoring -no shaking spell  3. History of locally advanced bladder cancer with diabetic colostomy and bilateral chronic nephrectomy followed by oncology and urology -currently getting chemo and radiation at the cancer center -appreciate Dr. Elroy Channel input. Patient will follow-up at the cancer center after discharge   4. History of anemia on iron and B12 supplements  5. Chronic leukocytosis likely related to cancer which is acutely worsened from possible low-grade infection -followed by oncology -?neulasta  6. history of DVT -- patient was on eliquis which was discontinued in August 2020 after 3 months of treatment  Patient is ambulatory at home using a walker. -Physical therapy to see patient.   Family communication : discussed with wife in the room Consults : Discharge Disposition : to home likely tomorrow CODE STATUS: DNR confirmed with wife DVT Prophylaxis : Lovenox  TOTAL TIME TAKING CARE OF THIS PATIENT: *35* minutes.  >50% time spent on counselling and coordination of care  POSSIBLE D/C IN *1 DAYS, DEPENDING ON CLINICAL CONDITION.  Note: This dictation was prepared with Dragon dictation along with smaller phrase technology. Any transcriptional errors that result from this process are unintentional.  Fritzi Mandes M.D on 09/27/2019 at 11:00 AM  Between 7am to 6pm - Pager - [phone]  After 6pm go to [email or web address]  [facility]   CC: Primary care physician; [name], MDPatient ID: [name], male   DOB: 1934-11-20, 84 y.o.   MRN: [id number]

## 2019-09-27 NOTE — Care Management Important Message (Signed)
Important Message  Patient Details  Name: Vincent Poole MRN: [id number] Date of Birth: 1935-07-29   Medicare Important Message Given:  Yes     Dannette Barbara 09/27/2019, 12:24 PM

## 2019-09-28 LAB — BASIC METABOLIC PANEL
Anion gap: 9 (ref 5–15)
BUN: 14 mg/dL (ref 8–23)
CO2: 25 mmol/L (ref 22–32)
Calcium: 8.3 mg/dL — ABNORMAL LOW (ref 8.9–10.3)
Chloride: 106 mmol/L (ref 98–111)
Creatinine, Ser: 0.93 mg/dL (ref 0.61–1.24)
GFR calc Af Amer: >60 mL/min (ref >60)
GFR calc non Af Amer: >60 mL/min (ref >60)
Glucose, Bld: 83 mg/dL (ref 70–99)
Potassium: 3.7 mmol/L (ref 3.5–5.1)
Sodium: 140 mmol/L (ref 135–145)

## 2019-09-28 LAB — CBC
HCT: 34.8 % — ABNORMAL LOW (ref 39.0–52.0)
Hemoglobin: 10.8 g/dL — ABNORMAL LOW (ref 13.0–17.0)
MCH: 27.8 pg (ref 26.0–34.0)
MCHC: 31 g/dL (ref 30.0–36.0)
MCV: 89.5 fL (ref 80.0–100.0)
Platelets: 298 10*3/uL (ref 150–400)
RBC: 3.89 MIL/uL — ABNORMAL LOW (ref 4.22–5.81)
RDW: 19.1 % — ABNORMAL HIGH (ref 11.5–15.5)
WBC: 48.8 10*3/uL — ABNORMAL HIGH (ref 4.0–10.5)
nRBC: 0 % (ref 0.0–0.2)

## 2019-09-28 LAB — MAGNESIUM: Magnesium: 1.9 mg/dL (ref 1.7–2.4)

## 2019-09-28 MED ORDER — CEPHALEXIN 500 MG PO CAPS
500.0000 mg | ORAL_CAPSULE | Freq: Four times a day (QID) | ORAL | Status: DC
  Administered 2019-09-28: 13:00:00 500 mg via ORAL
  Filled 2019-09-28: qty 1

## 2019-09-28 MED ORDER — CEPHALEXIN 500 MG PO CAPS: 500.0000 mg | ORAL_CAPSULE | Freq: Four times a day (QID) | ORAL | 0 refills | Status: AC

## 2019-09-28 MED ORDER — HEPARIN SOD (PORK) LOCK FLUSH 100 UNIT/ML IV SOLN
500.0000 [IU] | Freq: Once | INTRAVENOUS | Status: AC
  Administered 2019-09-28: 500 [IU] via INTRAVENOUS
  Filled 2019-09-28: qty 5

## 2019-09-28 MED ORDER — ENOXAPARIN SODIUM 40 MG/0.4ML ~~LOC~~ SOLN: 40.0000 mg | SUBCUTANEOUS | Status: DC

## 2019-09-28 MED ORDER — CEPHALEXIN 500 MG PO CAPS: 500.0000 mg | ORAL_CAPSULE | Freq: Three times a day (TID) | ORAL | Status: DC

## 2019-09-28 NOTE — Discharge Summary (Signed)
[facility] NAME: Vincent Poole    MR#:  [id number]  DATE OF BIRTH:  22-Feb-1935  DATE OF ADMISSION:  09/25/2019 ADMITTING PHYSICIAN: Rise Patience, MD  DATE OF DISCHARGE: 11/21  PRIMARY CARE PHYSICIAN: [name], MD    ADMISSION DIAGNOSIS:  Sepsis with encephalopathy without septic shock, due to unspecified organism ([place]) [A41.9, R65.20, G93.40] Sepsis ([place]) [A41.9]  DISCHARGE DIAGNOSIS:  Sepsis on Presentation--now resolved Recurrent UTI due to bilateral Nephrostomy tubes--chronic Chronic Leucocytosis SECONDARY DIAGNOSIS:   Past Medical History:  Diagnosis Date  . Anxiety   . Arthritis   . Bladder cancer ([place])   . Dysrhythmia 07/2018   history of atrial flutter that worsens with anxiety  . Femur fracture, right ([place]) 05/01/2018  . GERD (gastroesophageal reflux disease)   . History of recent blood transfusion 05/2018  . Hypertension   . Iron deficiency anemia 09/14/2018  . Prostate cancer ([place]) 07/2018   cancer growing in prostate but not prostate cancer, it is from the bladder  . Umbilical hernia 42/5956  . Urinary retention 2019   foley catheter place 11/2017  . UTI (urinary tract infection) 2019   frequent UTI's over last year  . Wound eschar of foot 07/2018   left heal getting wrapped and requiring antibiotic cream. cracks open with weight bearing.    HOSPITAL COURSE:   TAEVIN MCFERRAN a 84 y.o.malewithhistory of locally advanced bladder cancer with diverting colostomy and bilateral nephrostomy being followed by oncologist. Previous history of prostate cancer, DVT of the lower extremity history of anemia on iron and B12 supplements was found to be acutely confused with seizure-like activity per patient's family.  1. Sepsis on presentation in the ER source suspected UTI with bilateral nephrestomy tubes -sepsis now resolved -presented with hypotension altered mental status with lactic acid  of 5.0 improved with IV fluids. -lactic acid down to 1.0 -Patient has chronic bilateral nephrostomy tubes and has had previous UTIs due to his bladder carcinoma -currently on IV CEFEPIME--change to IV rocephin and po keflex as out pt -blood culture negative -COVID-19 negative -is afebrile and sats are 99% on room air and is at baseline -PROcalcitonin 0.33     -recieved IV fluids  2. Acute encephalopathy with possible seizure/shakiness in the setting of hypotension--resolved -CT head unremarkable -EEG diffuse slowing however no epileptiform activity noted -no known history of seizures  3. History of locally advanced bladder cancer with diabetic colostomy and bilateral chronic nephrectomy followed by oncology and urology -currently getting chemo and radiation at the cancer center -appreciate Dr. Elroy Channel input. Patient will follow-up at the cancer center after discharge   4. History of anemia on iron and B12 supplements  5. Chronic leukocytosis likely related to cancer which is acutely worsened from possible low-grade infection -followed by oncology -patient has not got recently any Neulasta. -White count is 48K-- patient is asymptomatic. He is afebrile. Dr. [name] will follow-up labs as outpatient. She is aware patient is going home.  6. history of DVT -- patient was on eliquis which was discontinued in August 2020 after 3 months of treatment  Patient is ambulatory at home using a walker. -Physical therapy input appreciated. Patient's wife does not want any home health therapy.  Overall improved. Discharge to home.  Family communication : discussed with son Francee Piccolo on the phone. Consults : Discharge Disposition : to home  CODE STATUS: DNR confirmed with wife DVT Prophylaxis : Lovenox  CONSULTS OBTAINED:  Treatment  Team:  Sindy Guadeloupe, MD  DRUG ALLERGIES:  No Known Allergies  DISCHARGE MEDICATIONS:   Allergies as of 09/28/2019   No Known Allergies     Medication  List    TAKE these medications   acetaminophen 500 MG tablet Commonly known as: TYLENOL Take 1,000 mg by mouth every 6 (six) hours as needed for moderate pain or headache.   cephALEXin 500 MG capsule Commonly known as: KEFLEX Take 1 capsule (500 mg total) by mouth 4 (four) times daily for 20 doses.   cetirizine 10 MG tablet Commonly known as: ZYRTEC Take 10 mg by mouth daily as needed for allergies.   dexamethasone 4 MG tablet Commonly known as: DECADRON Take 2 tablets (8 mg total) by mouth daily. Start the day after chemotherapy for 2 days.   DRY EYE RELIEF DROPS OP Apply 1 drop to eye daily.   feeding supplement (ENSURE ENLIVE) Liqd Take 237 mLs by mouth 2 (two) times daily between meals.   ferrous sulfate 325 (65 FE) MG tablet Take 1 tablet (325 mg total) by mouth 2 (two) times daily with a meal.   lidocaine-prilocaine cream Commonly known as: EMLA Apply 1 application topically as needed.   multivitamin with minerals Tabs tablet Take 1 tablet by mouth daily.   Myrbetriq 50 MG Tb24 tablet Generic drug: mirabegron ER TAKE 1 TABLET BY MOUTH DAILY What changed: how much to take   nystatin powder Commonly known as: Nyamyc Apply topically 2 (two) times daily. What changed:   how much to take  when to take this  reasons to take this   ondansetron 8 MG tablet Commonly known as: Zofran Take 1 tablet (8 mg total) by mouth 2 (two) times daily as needed for refractory nausea / vomiting. Start on day 3 after chemo.   oxyCODONE 5 MG immediate release tablet Commonly known as: Oxy IR/ROXICODONE Take 1 tablet (5 mg total) by mouth every 4 (four) hours as needed for severe pain.   polyethylene glycol powder 17 GM/SCOOP powder Commonly known as: MiraLax Take 17 g by mouth daily as needed. Can increase to 3 times a day as needed for constipation but hold medication if has diarrhea   prochlorperazine 10 MG tablet Commonly known as: COMPAZINE Take 1 tablet (10 mg total)  by mouth every 6 (six) hours as needed (Nausea or vomiting).   Super Cranberry/Vitamin D3 4200-500 MG-UNIT Caps Generic drug: Cranberry-Cholecalciferol Take 1 capsule by mouth 2 (two) times daily.   vitamin B-12 500 MCG tablet Commonly known as: CYANOCOBALAMIN Take 500 mcg by mouth daily.   zinc oxide 20 % ointment Apply 1 application topically as needed for irritation.       If you experience worsening of your admission symptoms, develop shortness of breath, life threatening emergency, suicidal or homicidal thoughts you must seek medical attention immediately by calling 911 or calling your MD immediately  if symptoms less severe.  You Must read complete instructions/literature along with all the possible adverse reactions/side effects for all the Medicines you take and that have been prescribed to you. Take any new Medicines after you have completely understood and accept all the possible adverse reactions/side effects.   Please note  You were cared for by a hospitalist during your hospital stay. If you have any questions about your discharge medications or the care you received while you were in the hospital after you are discharged, you can call the unit and asked to speak with the hospitalist on call if  the hospitalist that took care of you is not available. Once you are discharged, your primary care physician will handle any further medical issues. Please note that NO REFILLS for any discharge medications will be authorized once you are discharged, as it is imperative that you return to your primary care physician (or establish a relationship with a primary care physician if you do not have one) for your aftercare needs so that they can reassess your need for medications and monitor your lab values. Today   SUBJECTIVE   Doing very well. Ate good breakfast. No fever. Denies any abdominal pain nausea vomiting  VITAL SIGNS:  Blood pressure 99/63, pulse 89, temperature 98 F (36.7 C),  temperature source Oral, resp. rate 19, height 5\' 7"  (1.702 m), weight 79.4 kg, SpO2 97 %.  I/O:    Intake/Output Summary (Last 24 hours) at 09/28/2019 1022 Last data filed at 09/28/2019 0900 Gross per 24 hour  Intake 340 ml  Output 1365 ml  Net -1025 ml    PHYSICAL EXAMINATION:  GENERAL:  84 y.o.-year-old patient lying in the bed with no acute distress.  EYES: Pupils equal, round, reactive to light and accommodation. No scleral icterus. Extraocular muscles intact.  HEENT: Head atraumatic, normocephalic. Oropharynx and nasopharynx clear.  NECK:  Supple, no jugular venous distention. No thyroid enlargement, no tenderness.  LUNGS: Normal breath sounds bilaterally, no wheezing, rales,rhonchi or crepitation. No use of accessory muscles of respiration.  CARDIOVASCULAR: S1, S2 normal. No murmurs, rubs, or gallops.  ABDOMEN: Soft, non-tender, non-distended. Bowel sounds present. No organomegaly or mass. Bilateral nephrostomy tubes and colostomy present EXTREMITIES: No pedal edema, cyanosis, or clubbing.  NEUROLOGIC: Cranial nerves II through XII are intact. Muscle strength 5/5 in all extremities. Sensation intact. Gait not checked.  PSYCHIATRIC: The patient is alert and oriented x 3.  SKIN: No obvious rash, lesion, or ulcer.   DATA REVIEW:   CBC  Recent Labs  Lab 09/28/19 0504  WBC 48.8*  HGB 10.8*  HCT 34.8*  PLT 298    Chemistries  Recent Labs  Lab 09/26/19 0519 09/28/19 0504  NA 141 140  K 3.8 3.7  CL 105 106  CO2 26 25  GLUCOSE 122* 83  BUN 22 14  CREATININE 1.08 0.93  CALCIUM 8.4* 8.3*  MG  --  1.9  AST 10*  --   ALT 10  --   ALKPHOS 101  --   BILITOT 0.4  --     Microbiology Results   Recent Results (from the past 240 hour(s))  SARS CORONAVIRUS 2 (TAT 6-24 HRS) Nasopharyngeal Nasopharyngeal Swab     Status: None   Collection Time: 09/25/19  7:20 PM   Specimen: Nasopharyngeal Swab  Result Value Ref Range Status   SARS Coronavirus 2 NEGATIVE NEGATIVE  Final    Comment: (NOTE) SARS-CoV-2 target nucleic acids are NOT DETECTED. The SARS-CoV-2 RNA is generally detectable in upper and lower respiratory specimens during the acute phase of infection. Negative results do not preclude SARS-CoV-2 infection, do not rule out co-infections with other pathogens, and should not be used as the sole basis for treatment or other patient management decisions. Negative results must be combined with clinical observations, patient history, and epidemiological information. The expected result is Negative. Fact Sheet for Patients: SugarRoll.be Fact Sheet for Healthcare Providers: https://www.woods-mathews.com/ This test is not yet approved or cleared by the [place] FDA and  has been authorized for detection and/or diagnosis of SARS-CoV-2 by FDA under an Emergency Use Authorization (  EUA). This EUA will remain  in effect (meaning this test can be used) for the duration of the COVID-19 declaration under Section 56 4(b)(1) of the Act, 21 U.S.C. section 360bbb-3(b)(1), unless the authorization is terminated or revoked sooner. Performed at [facility], [place] [address], [place], [place] [address]   Culture, blood (Routine x 2)     Status: None (Preliminary result)   Collection Time: 09/25/19  7:30 PM   Specimen: BLOOD  Result Value Ref Range Status   Specimen Description BLOOD BLOOD LEFT FOREARM  Final   Special Requests   Final    BOTTLES DRAWN AEROBIC AND ANAEROBIC Blood Culture results may not be optimal due to an excessive volume of blood received in culture bottles   Culture   Final    NO GROWTH 3 DAYS Performed at [facility], [address], [place], [place] [address]    Report Status PENDING  Incomplete  Culture, blood (Routine x 2)     Status: None (Preliminary result)   Collection Time: 09/25/19  7:30 PM   Specimen: BLOOD  Result Value Ref Range Status   Specimen Description BLOOD  RIGHT ANTECUBITAL  Final   Special Requests   Final    BOTTLES DRAWN AEROBIC AND ANAEROBIC Blood Culture adequate volume   Culture   Final    NO GROWTH 3 DAYS Performed at [facility], [address], [place], [place] [address]    Report Status PENDING  Incomplete    RADIOLOGY:  No results found.   CODE STATUS:     Code Status Orders  (From admission, onward)         Start     Ordered   09/25/19 2326  Do not attempt resuscitation (DNR)  Continuous    Question Answer Comment  In the event of cardiac or respiratory ARREST Do not call a "code blue"   In the event of cardiac or respiratory ARREST Do not perform Intubation, CPR, defibrillation or ACLS   In the event of cardiac or respiratory ARREST Use medication by any route, position, wound care, and other measures to relive pain and suffering. May use oxygen, suction and manual treatment of airway obstruction as needed for comfort.      09/25/19 2326        Code Status History    Date Active Date Inactive Code Status Order ID Comments User Context   09/25/2019 2206 09/25/2019 2326 DNR [id number]  Merlyn Lot, MD ED   06/07/2019 1651 06/12/2019 1937 DNR [id number]  Basilio Cairo, NP Inpatient   06/07/2019 0103 06/07/2019 1651 Full Code [id number]  Mayer Camel, NP Inpatient   04/26/2019 0420 04/29/2019 1913 Full Code [id number]  Mansy, Arvella Merles, MD ED   04/08/2019 1211 04/10/2019 1628 Full Code [id number]  Gladstone Lighter, MD ED   03/22/2019 2337 03/26/2019 1934 Full Code [id number]  Lance Coon, MD Inpatient   02/21/2019 1505 02/26/2019 1849 Full Code [id number]  Mayo, Pete Pelt, MD Inpatient   11/12/2018 1849 11/16/2018 1632 Full Code [id number]  Epifanio Lesches, MD Inpatient   05/01/2018 1702 05/06/2018 1711 Full Code [id number]  Dustin Flock, MD Inpatient   12/26/2017 2141 12/30/2017 1727 Full Code [id number]  Demetrios Loll, MD Inpatient   05/31/2017 0208 06/01/2017 1922 Full Code [id number]  Axtell, [place], DO Inpatient    03/04/2017 1833 03/05/2017 1426 Full Code [id number]  Idelle Crouch, MD Inpatient   07/11/2016 0538 07/13/2016 1420 Full Code [id number]  Saundra Shelling, MD  ED   Advance Care Planning Activity    Advance Directive Documentation     Most Recent Value  Type of Advance Directive  Healthcare Power of Attorney  Pre-existing out of facility DNR order (yellow form or pink MOST form)  -  "MOST" Form in Place?  -     Family Discussion: Consults:   TOTAL TIME TAKING CARE OF THIS PATIENT: *40* minutes.    Fritzi Mandes M.D on 09/28/2019 at 10:22 AM  Between 7am to 6pm - Pager - [phone] After 6pm go to [email or web address] - password [email or web address]  [facility]    CC: Primary care physician; [name], MD

## 2019-09-28 NOTE — Progress Notes (Signed)
Vincent Poole to be D/C'd Home per MD order.  Discussed prescriptions and follow up appointments with the patient. Prescriptions given to patient, medication list explained in detail. Pt verbalized understanding.Son here to transport pt home.  Allergies as of 09/28/2019   No Known Allergies     Medication List    TAKE these medications   acetaminophen 500 MG tablet Commonly known as: TYLENOL Take 1,000 mg by mouth every 6 (six) hours as needed for moderate pain or headache.   cephALEXin 500 MG capsule Commonly known as: KEFLEX Take 1 capsule (500 mg total) by mouth 4 (four) times daily for 20 doses.   cetirizine 10 MG tablet Commonly known as: ZYRTEC Take 10 mg by mouth daily as needed for allergies.   dexamethasone 4 MG tablet Commonly known as: DECADRON Take 2 tablets (8 mg total) by mouth daily. Start the day after chemotherapy for 2 days.   DRY EYE RELIEF DROPS OP Apply 1 drop to eye daily.   feeding supplement (ENSURE ENLIVE) Liqd Take 237 mLs by mouth 2 (two) times daily between meals.   ferrous sulfate 325 (65 FE) MG tablet Take 1 tablet (325 mg total) by mouth 2 (two) times daily with a meal.   lidocaine-prilocaine cream Commonly known as: EMLA Apply 1 application topically as needed.   multivitamin with minerals Tabs tablet Take 1 tablet by mouth daily.   Myrbetriq 50 MG Tb24 tablet Generic drug: mirabegron ER TAKE 1 TABLET BY MOUTH DAILY What changed: how much to take   nystatin powder Commonly known as: Nyamyc Apply topically 2 (two) times daily. What changed:   how much to take  when to take this  reasons to take this   ondansetron 8 MG tablet Commonly known as: Zofran Take 1 tablet (8 mg total) by mouth 2 (two) times daily as needed for refractory nausea / vomiting. Start on day 3 after chemo.   oxyCODONE 5 MG immediate release tablet Commonly known as: Oxy IR/ROXICODONE Take 1 tablet (5 mg total) by mouth every 4 (four) hours as needed  for severe pain.   polyethylene glycol powder 17 GM/SCOOP powder Commonly known as: MiraLax Take 17 g by mouth daily as needed. Can increase to 3 times a day as needed for constipation but hold medication if has diarrhea   prochlorperazine 10 MG tablet Commonly known as: COMPAZINE Take 1 tablet (10 mg total) by mouth every 6 (six) hours as needed (Nausea or vomiting).   Super Cranberry/Vitamin D3 4200-500 MG-UNIT Caps Generic drug: Cranberry-Cholecalciferol Take 1 capsule by mouth 2 (two) times daily.   vitamin B-12 500 MCG tablet Commonly known as: CYANOCOBALAMIN Take 500 mcg by mouth daily.   zinc oxide 20 % ointment Apply 1 application topically as needed for irritation.       Vitals:   09/28/19 0638 09/28/19 0727  BP: (!) 112/55 99/63  Pulse: 88 89  Resp: 20 19  Temp: 98.9 F (37.2 C) 98 F (36.7 C)  SpO2: 97% 97%   Tele box removed and returned. Skin clean, dry and intact without evidence of skin break down, no evidence of skin tears noted. IV catheter discontinued intact. Site without signs and symptoms of complications. Dressing and pressure applied. Pt denies pain at this time. No complaints noted.  An After Visit Summary was printed and given to the patient. Patient escorted via [place], and D/C home via private auto.  Rolley Sims

## 2019-09-28 NOTE — Discharge Instructions (Signed)
Nephrectomy tube and colostomy care as before

## 2019-09-30 LAB — CULTURE, BLOOD (ROUTINE X 2)
Culture: NO GROWTH
Culture: NO GROWTH
Special Requests: ADEQUATE

## 2019-10-01 DIAGNOSIS — C679 Malignant neoplasm of bladder, unspecified: Secondary | ICD-10-CM | POA: Diagnosis not present

## 2019-10-01 DIAGNOSIS — C799 Secondary malignant neoplasm of unspecified site: Secondary | ICD-10-CM | POA: Diagnosis not present

## 2019-10-05 ENCOUNTER — Other Ambulatory Visit: Payer: Self-pay

## 2019-10-05 ENCOUNTER — Emergency Department: Payer: Medicare Other

## 2019-10-05 ENCOUNTER — Inpatient Hospital Stay
Admission: EM | Admit: 2019-10-05 | Discharge: 2019-10-29 | DRG: 871 | Disposition: A | Discharge status: Home Health | Payer: Medicare Other | Attending: Internal Medicine | Admitting: Internal Medicine

## 2019-10-05 ENCOUNTER — Encounter: Payer: Self-pay | Admitting: Intensive Care

## 2019-10-05 DIAGNOSIS — D72829 Elevated white blood cell count, unspecified: Secondary | ICD-10-CM | POA: Diagnosis not present

## 2019-10-05 DIAGNOSIS — L89151 Pressure ulcer of sacral region, stage 1: Secondary | ICD-10-CM | POA: Diagnosis present

## 2019-10-05 DIAGNOSIS — N139 Obstructive and reflux uropathy, unspecified: Secondary | ICD-10-CM | POA: Diagnosis present

## 2019-10-05 DIAGNOSIS — Z87440 Personal history of urinary (tract) infections: Secondary | ICD-10-CM | POA: Diagnosis not present

## 2019-10-05 DIAGNOSIS — A4101 Sepsis due to Methicillin susceptible Staphylococcus aureus: Secondary | ICD-10-CM | POA: Diagnosis not present

## 2019-10-05 DIAGNOSIS — Z66 Do not resuscitate: Secondary | ICD-10-CM | POA: Diagnosis present

## 2019-10-05 DIAGNOSIS — D65 Disseminated intravascular coagulation [defibrination syndrome]: Secondary | ICD-10-CM | POA: Diagnosis not present

## 2019-10-05 DIAGNOSIS — R1900 Intra-abdominal and pelvic swelling, mass and lump, unspecified site: Secondary | ICD-10-CM | POA: Diagnosis not present

## 2019-10-05 DIAGNOSIS — K651 Peritoneal abscess: Secondary | ICD-10-CM

## 2019-10-05 DIAGNOSIS — I2510 Atherosclerotic heart disease of native coronary artery without angina pectoris: Secondary | ICD-10-CM | POA: Diagnosis present

## 2019-10-05 DIAGNOSIS — Z9221 Personal history of antineoplastic chemotherapy: Secondary | ICD-10-CM | POA: Diagnosis not present

## 2019-10-05 DIAGNOSIS — L8990 Pressure ulcer of unspecified site, unspecified stage: Secondary | ICD-10-CM | POA: Diagnosis present

## 2019-10-05 DIAGNOSIS — C7911 Secondary malignant neoplasm of bladder: Secondary | ICD-10-CM | POA: Diagnosis not present

## 2019-10-05 DIAGNOSIS — Z515 Encounter for palliative care: Secondary | ICD-10-CM | POA: Diagnosis not present

## 2019-10-05 DIAGNOSIS — Z9049 Acquired absence of other specified parts of digestive tract: Secondary | ICD-10-CM

## 2019-10-05 DIAGNOSIS — Z933 Colostomy status: Secondary | ICD-10-CM

## 2019-10-05 DIAGNOSIS — Z841 Family history of disorders of kidney and ureter: Secondary | ICD-10-CM

## 2019-10-05 DIAGNOSIS — A419 Sepsis, unspecified organism: Secondary | ICD-10-CM | POA: Diagnosis present

## 2019-10-05 DIAGNOSIS — Z936 Other artificial openings of urinary tract status: Secondary | ICD-10-CM | POA: Diagnosis not present

## 2019-10-05 DIAGNOSIS — Z8781 Personal history of (healed) traumatic fracture: Secondary | ICD-10-CM | POA: Diagnosis not present

## 2019-10-05 DIAGNOSIS — Z8249 Family history of ischemic heart disease and other diseases of the circulatory system: Secondary | ICD-10-CM | POA: Diagnosis not present

## 2019-10-05 DIAGNOSIS — C7989 Secondary malignant neoplasm of other specified sites: Secondary | ICD-10-CM | POA: Diagnosis present

## 2019-10-05 DIAGNOSIS — C678 Malignant neoplasm of overlapping sites of bladder: Secondary | ICD-10-CM | POA: Diagnosis present

## 2019-10-05 DIAGNOSIS — B999 Unspecified infectious disease: Secondary | ICD-10-CM

## 2019-10-05 DIAGNOSIS — L02211 Cutaneous abscess of abdominal wall: Secondary | ICD-10-CM | POA: Diagnosis present

## 2019-10-05 DIAGNOSIS — C61 Malignant neoplasm of prostate: Secondary | ICD-10-CM | POA: Diagnosis not present

## 2019-10-05 DIAGNOSIS — C7982 Secondary malignant neoplasm of genital organs: Secondary | ICD-10-CM | POA: Diagnosis present

## 2019-10-05 DIAGNOSIS — Z20828 Contact with and (suspected) exposure to other viral communicable diseases: Secondary | ICD-10-CM | POA: Diagnosis present

## 2019-10-05 DIAGNOSIS — I10 Essential (primary) hypertension: Secondary | ICD-10-CM | POA: Diagnosis present

## 2019-10-05 DIAGNOSIS — Z7401 Bed confinement status: Secondary | ICD-10-CM

## 2019-10-05 DIAGNOSIS — N131 Hydronephrosis with ureteral stricture, not elsewhere classified: Secondary | ICD-10-CM | POA: Diagnosis not present

## 2019-10-05 DIAGNOSIS — H9190 Unspecified hearing loss, unspecified ear: Secondary | ICD-10-CM | POA: Diagnosis present

## 2019-10-05 DIAGNOSIS — Z960 Presence of urogenital implants: Secondary | ICD-10-CM | POA: Diagnosis not present

## 2019-10-05 DIAGNOSIS — C792 Secondary malignant neoplasm of skin: Secondary | ICD-10-CM | POA: Diagnosis not present

## 2019-10-05 DIAGNOSIS — N390 Urinary tract infection, site not specified: Secondary | ICD-10-CM

## 2019-10-05 DIAGNOSIS — E876 Hypokalemia: Secondary | ICD-10-CM | POA: Diagnosis present

## 2019-10-05 DIAGNOSIS — N322 Vesical fistula, not elsewhere classified: Secondary | ICD-10-CM | POA: Diagnosis present

## 2019-10-05 DIAGNOSIS — D72823 Leukemoid reaction: Secondary | ICD-10-CM | POA: Diagnosis present

## 2019-10-05 DIAGNOSIS — C679 Malignant neoplasm of bladder, unspecified: Secondary | ICD-10-CM | POA: Diagnosis not present

## 2019-10-05 DIAGNOSIS — K56609 Unspecified intestinal obstruction, unspecified as to partial versus complete obstruction: Secondary | ICD-10-CM | POA: Diagnosis not present

## 2019-10-05 DIAGNOSIS — R6520 Severe sepsis without septic shock: Secondary | ICD-10-CM | POA: Diagnosis not present

## 2019-10-05 LAB — CBC WITH DIFFERENTIAL/PLATELET
Abs Immature Granulocytes: 2.07 10*3/uL — ABNORMAL HIGH (ref 0.00–0.07)
Basophils Absolute: 0.6 10*3/uL — ABNORMAL HIGH (ref 0.0–0.1)
Basophils Relative: 1 %
Eosinophils Absolute: 0.9 10*3/uL — ABNORMAL HIGH (ref 0.0–0.5)
Eosinophils Relative: 1 %
HCT: 36.4 % — ABNORMAL LOW (ref 39.0–52.0)
Hemoglobin: 11.9 g/dL — ABNORMAL LOW (ref 13.0–17.0)
Immature Granulocytes: 3 %
Lymphocytes Relative: 8 %
Lymphs Abs: 5.4 10*3/uL — ABNORMAL HIGH (ref 0.7–4.0)
MCH: 27.8 pg (ref 26.0–34.0)
MCHC: 32.7 g/dL (ref 30.0–36.0)
MCV: 85 fL (ref 80.0–100.0)
Monocytes Absolute: 2.9 10*3/uL — ABNORMAL HIGH (ref 0.1–1.0)
Monocytes Relative: 5 %
Neutro Abs: 51.9 10*3/uL — ABNORMAL HIGH (ref 1.7–7.7)
Neutrophils Relative %: 82 %
Platelets: 293 10*3/uL (ref 150–400)
RBC: 4.28 MIL/uL (ref 4.22–5.81)
RDW: 18.7 % — ABNORMAL HIGH (ref 11.5–15.5)
Smear Review: NORMAL
WBC: 63.7 10*3/uL (ref 4.0–10.5)
nRBC: 0 % (ref 0.0–0.2)

## 2019-10-05 LAB — COMPREHENSIVE METABOLIC PANEL
ALT: 9 U/L (ref 0–44)
AST: 13 U/L — ABNORMAL LOW (ref 15–41)
Albumin: 2.9 g/dL — ABNORMAL LOW (ref 3.5–5.0)
Alkaline Phosphatase: 130 U/L — ABNORMAL HIGH (ref 38–126)
Anion gap: 12 (ref 5–15)
BUN: 20 mg/dL (ref 8–23)
CO2: 21 mmol/L — ABNORMAL LOW (ref 22–32)
Calcium: 8.5 mg/dL — ABNORMAL LOW (ref 8.9–10.3)
Chloride: 100 mmol/L (ref 98–111)
Creatinine, Ser: 0.92 mg/dL (ref 0.61–1.24)
GFR calc Af Amer: >60 mL/min (ref >60)
GFR calc non Af Amer: >60 mL/min (ref >60)
Glucose, Bld: 133 mg/dL — ABNORMAL HIGH (ref 70–99)
Potassium: 3.8 mmol/L (ref 3.5–5.1)
Sodium: 133 mmol/L — ABNORMAL LOW (ref 135–145)
Total Bilirubin: 0.6 mg/dL (ref 0.3–1.2)
Total Protein: 6.7 g/dL (ref 6.5–8.1)

## 2019-10-05 LAB — URINALYSIS, COMPLETE (UACMP) WITH MICROSCOPIC
Bilirubin Urine: NEGATIVE
Glucose, UA: NEGATIVE mg/dL
Hgb urine dipstick: NEGATIVE
Ketones, ur: 5 mg/dL — AB
Nitrite: NEGATIVE
Protein, ur: 100 mg/dL — AB
RBC / HPF: >50 RBC/hpf — ABNORMAL HIGH (ref 0–5)
Specific Gravity, Urine: 1.024 (ref 1.005–1.030)
WBC, UA: >50 WBC/hpf — ABNORMAL HIGH (ref 0–5)
pH: 5 (ref 5.0–8.0)

## 2019-10-05 LAB — LACTIC ACID, PLASMA
Lactic Acid, Venous: 2.7 mmol/L (ref 0.5–1.9)
Lactic Acid, Venous: 1.4 mmol/L (ref 0.5–1.9)

## 2019-10-05 MED ORDER — METRONIDAZOLE IN NACL 5-0.79 MG/ML-% IV SOLN
500.0000 mg | Freq: Three times a day (TID) | INTRAVENOUS | Status: DC
  Administered 2019-10-06 – 2019-10-16 (×33): 500 mg via INTRAVENOUS
  Filled 2019-10-05 (×38): qty 100

## 2019-10-05 MED ORDER — VANCOMYCIN HCL 1.5 G IV SOLR
1500.0000 mg | Freq: Once | INTRAVENOUS | Status: AC
  Administered 2019-10-05: 1500 mg via INTRAVENOUS
  Filled 2019-10-05: qty 1500

## 2019-10-05 MED ORDER — OXYCODONE HCL 5 MG PO TABS
5.0000 mg | ORAL_TABLET | ORAL | Status: DC | PRN
  Administered 2019-10-08 – 2019-10-29 (×28): 5 mg via ORAL
  Filled 2019-10-05 (×28): qty 1

## 2019-10-05 MED ORDER — VANCOMYCIN HCL 10 G IV SOLR
1500.0000 mg | INTRAVENOUS | Status: DC
  Filled 2019-10-05: qty 1500

## 2019-10-05 MED ORDER — ENOXAPARIN SODIUM 40 MG/0.4ML ~~LOC~~ SOLN
40.0000 mg | SUBCUTANEOUS | Status: DC
  Administered 2019-10-05: 40 mg via SUBCUTANEOUS
  Filled 2019-10-05: qty 0.4

## 2019-10-05 MED ORDER — IOHEXOL 300 MG/ML  SOLN
100.0000 mL | Freq: Once | INTRAMUSCULAR | Status: AC | PRN
  Administered 2019-10-05: 100 mL via INTRAVENOUS

## 2019-10-05 MED ORDER — SODIUM CHLORIDE 0.9 % IV SOLN
2.0000 g | Freq: Once | INTRAVENOUS | Status: AC
  Administered 2019-10-05: 2 g via INTRAVENOUS
  Filled 2019-10-05: qty 20

## 2019-10-05 MED ORDER — SODIUM CHLORIDE 0.9 % IV SOLN
INTRAVENOUS | Status: DC | PRN
  Administered 2019-10-05: 1000 mL via INTRAVENOUS
  Administered 2019-10-08 – 2019-10-09 (×2): 250 mL via INTRAVENOUS
  Administered 2019-10-11 (×2): 10 mL via INTRAVENOUS
  Administered 2019-10-12 – 2019-10-19 (×3): 500 mL via INTRAVENOUS

## 2019-10-05 MED ORDER — METRONIDAZOLE IN NACL 5-0.79 MG/ML-% IV SOLN
500.0000 mg | Freq: Once | INTRAVENOUS | Status: AC
  Administered 2019-10-05: 500 mg via INTRAVENOUS
  Filled 2019-10-05: qty 100

## 2019-10-05 MED ORDER — MIRABEGRON ER 50 MG PO TB24
50.0000 mg | ORAL_TABLET | Freq: Every day | ORAL | Status: DC
  Administered 2019-10-06 – 2019-10-29 (×24): 50 mg via ORAL
  Filled 2019-10-05 (×24): qty 1

## 2019-10-05 MED ORDER — SODIUM CHLORIDE 0.9 % IV SOLN
2.0000 g | Freq: Two times a day (BID) | INTRAVENOUS | Status: DC
  Administered 2019-10-06: 2 g via INTRAVENOUS
  Filled 2019-10-05 (×3): qty 2

## 2019-10-05 MED ORDER — DEXTROSE-NACL 5-0.45 % IV SOLN
INTRAVENOUS | Status: DC
  Administered 2019-10-05 – 2019-10-06 (×2): via INTRAVENOUS

## 2019-10-05 NOTE — ED Triage Notes (Addendum)
Patient has bleeding tumor present on middle pelvic area. Also experiencing pain at site. Took oxycodone 5mg  around 1:15 today. Patient has colostomy and two kidney drains for urine. Patient has been Nauseated since this AM. Patient is hard of hearing. Recently discharged from [facility] after being admitted for sepsis and pneumonia.

## 2019-10-05 NOTE — Consult Note (Signed)
Pharmacy Antibiotic Note  Vincent Poole is a 84 y.o. male admitted on 10/05/2019 with pneumonia and intra-abdominal infection.  Pharmacy has been consulted for Vancomycin and Cefepime dosing. Patient suffering from drainage from abdomen. Has red "lump" on lower abdomen that opened today and began draining white bloody fluid.  Plan: 1) Patient will receive Vancomycin 1500mg  IV x 1 dose in the ED. Maintenance: Vancomycin 1500 mg IV Q 24 hrs. Goal AUC 400-550. Expected AUC: 535.5 Expected Css: 11.0 SCr used: 0.92  2) Cefepime 2g IV Q12 hours.    Height: 5\' 11"  (180.3 cm) Weight: 145 lb (65.8 kg) IBW/kg (Calculated) : 75.3  Temp (24hrs), Avg:98.6 F (37 C), Min:98.6 F (37 C), Max:98.6 F (37 C)  Recent Labs  Lab 10/05/19 1427 10/05/19 1619  WBC 63.7*  --   CREATININE 0.92  --   LATICACIDVEN 2.7* 1.4    Estimated Creatinine Clearance: 57.6 mL/min (by C-G formula based on SCr of 0.92 mg/dL).    No Known Allergies  Antimicrobials this admission: Vancomycin 11/28 >>  Cefepime 11/28 >>  Metronidazole 11/28 >> Rocephin 11/28 x1   Microbiology results: 11/28 BCx: pending 11/28 UCx: pending   Thank you for allowing pharmacy to be a part of this patient's care.  Pearla Dubonnet 10/05/2019 7:46 PM

## 2019-10-05 NOTE — ED Notes (Signed)
Labs resulted Awaiting further plan of care. Wife at bedside.

## 2019-10-05 NOTE — ED Provider Notes (Addendum)
[facility] Emergency Department Provider Note  Time seen: 3:48 PM  I have reviewed the triage vital signs and the nursing notes.   HISTORY  Chief Complaint Tumor   HPI Vincent Poole is a 84 y.o. male with a past medical history of bladder carcinoma status post 2 nephrostomy tubes, colostomy, also with hypertension, anemia, prostate cancer, presents to the emergency department for discharge from the lower abdomen.  Patient was recently discharged from the hospital after admission for sepsis due to unknown organism.  Wife states the patient completed his antibiotics yesterday but continues to spike fevers yesterday.  Today they noticed drainage from his lower abdomen in the area where he has been diagnosed with bladder carcinoma.  Was supposed to restart radiation therapy on Monday per wife.  Patient denies any shortness of breath or cough.   Past Medical History:  Diagnosis Date  . Anxiety   . Arthritis   . Bladder cancer ([place])   . Dysrhythmia 07/2018   history of atrial flutter that worsens with anxiety  . Femur fracture, right ([place]) 05/01/2018  . GERD (gastroesophageal reflux disease)   . History of recent blood transfusion 05/2018  . Hypertension   . Iron deficiency anemia 09/14/2018  . Prostate cancer ([place]) 07/2018   cancer growing in prostate but not prostate cancer, it is from the bladder  . Umbilical hernia 07/9832  . Urinary retention 2019   foley catheter place 11/2017  . UTI (urinary tract infection) 2019   frequent UTI's over last year  . Wound eschar of foot 07/2018   left heal getting wrapped and requiring antibiotic cream. cracks open with weight bearing.    Patient Active Problem List   Diagnosis Date Noted  . S/P partial resection of colon 07/17/2019  . Protein-calorie malnutrition, severe 06/11/2019  . Abdominal pain, generalized   . Palliative care by specialist   . Urothelial cancer ([place])   . Weakness   . Large bowel obstruction  ([place]) 06/06/2019  . Hydronephrosis, right 04/08/2019  . Pressure injury of skin 03/23/2019  . SIRS (systemic inflammatory response syndrome) ([place]) 03/22/2019  . Leukocytosis 02/21/2019  . Attention to nephrostomy ([place]) 11/22/2018  . Complicated UTI (urinary tract infection) 11/14/2018  . Palliative care encounter   . Iron deficiency anemia secondary to inadequate dietary iron intake 06/27/2018  . B12 deficiency 06/27/2018  . History of fracture of right hip 06/26/2018  . History of normocytic normochromic anemia 06/26/2018  . Personal history of bladder cancer 06/26/2018  . Hip fracture ([place]) 05/01/2018  . Malignant neoplasm of urinary bladder ([place]) 01/12/2018  . Goals of care, counseling/discussion 01/12/2018  . History of recurrent UTIs 08/03/2017  . Pyelonephritis 05/31/2017  . UTI (urinary tract infection) 03/04/2017  . Urinary obstruction   . Essential hypertension 08/30/2016  . History of shingles 08/30/2016  . Prostate cancer ([place]) 08/30/2016  . Urinary retention 08/30/2016  . Medicare annual wellness visit, initial 08/01/2016  . Medicare annual wellness visit, subsequent 08/01/2016  . Sepsis ([place]) 07/11/2016  . Borderline diabetes mellitus 01/26/2016  . Vaccine counseling 01/26/2015  . Lumbar radiculitis 06/24/2014  . OA (osteoarthritis) of hip 06/24/2014  . Malignant neoplasm of prostate ([place]) 01/27/2011    Past Surgical History:  Procedure Laterality Date  . CARPAL TUNNEL RELEASE Right   . CHOLECYSTECTOMY  2004  . COLOSTOMY N/A 06/08/2019   Procedure: COLOSTOMY;  Surgeon: Herbert Pun, MD;  Location: [facility]S;  Service: General;  Laterality: N/A;  . CYSTOGRAM  07/18/2018   Procedure: CYSTOGRAM;  Surgeon: [name], MD;  Location: [facility];  Service: Urology;;  . Consuela Mimes W/ RETROGRADES Bilateral 07/18/2018   Procedure: CYSTOSCOPY WITH RETROGRADE PYELOGRAM;  Surgeon: [name], MD;  Location: [facility];  Service: Urology;  Laterality: Bilateral;   . CYSTOSCOPY W/ URETERAL STENT PLACEMENT Bilateral 12/27/2017   Procedure: CYSTOSCOPY WITH RETROGRADE PYELOGRAM/URETERAL STENT PLACEMENT;  Surgeon: [name], MD;  Location: [facility];  Service: Urology;  Laterality: Bilateral;  . CYSTOSCOPY W/ URETERAL STENT PLACEMENT Bilateral 07/18/2018   Procedure: CYSTOSCOPY WITH STENT REPLACEMENT (exchange);  Surgeon: [name], MD;  Location: [facility];  Service: Urology;  Laterality: Bilateral;  . CYSTOSCOPY WITH STENT PLACEMENT Bilateral 11/12/2018   Procedure: [place] WITH STENT Exchange;  Surgeon: [name], MD;  Location: [facility];  Service: Urology;  Laterality: Bilateral;  . INTRAMEDULLARY (IM) NAIL INTERTROCHANTERIC Right 05/02/2018   Procedure: INTRAMEDULLARY (IM) NAIL INTERTROCHANTRIC;  Surgeon: [name], MD;  Location: [facility];  Service: Orthopedics;  Laterality: Right;  . IR NEPHROSTOMY EXCHANGE LEFT  03/25/2019  . IR NEPHROSTOMY EXCHANGE LEFT  04/19/2019  . IR NEPHROSTOMY EXCHANGE LEFT  05/31/2019  . IR NEPHROSTOMY EXCHANGE LEFT  07/19/2019  . IR NEPHROSTOMY EXCHANGE LEFT  09/13/2019  . IR NEPHROSTOMY EXCHANGE RIGHT  03/25/2019  . IR NEPHROSTOMY EXCHANGE RIGHT  04/19/2019  . IR NEPHROSTOMY EXCHANGE RIGHT  05/31/2019  . IR NEPHROSTOMY EXCHANGE RIGHT  07/19/2019  . IR NEPHROSTOMY EXCHANGE RIGHT  09/13/2019  . IR NEPHROSTOMY PLACEMENT LEFT  02/23/2019  . IR NEPHROSTOMY PLACEMENT RIGHT  02/23/2019  . LAPAROTOMY N/A 06/08/2019   Procedure: EXPLORATORY LAPAROTOMY;  Surgeon: Herbert Pun, MD;  Location: [facility];  Service: General;  Laterality: N/A;  . LEG TENDON SURGERY Right 1958  . PORTA CATH INSERTION N/A 01/22/2018   Procedure: PORTA CATH INSERTION;  Surgeon: [name], MD;  Location: [facility];  Service: Cardiovascular;  Laterality: N/A;  . TRANSURETHRAL RESECTION OF BLADDER TUMOR N/A 12/27/2017   Procedure: TRANSURETHRAL RESECTION OF BLADDER TUMOR (TURBT);  Surgeon: [name], MD;  Location: [facility];   Service: Urology;  Laterality: N/A;  . TRANSURETHRAL RESECTION OF BLADDER TUMOR N/A 01/15/2018   Procedure: TRANSURETHRAL RESECTION OF BLADDER TUMOR (TURBT);  Surgeon: [name], MD;  Location: [facility];  Service: Urology;  Laterality: N/A;  Need 2 hrs for this case please  . TRANSURETHRAL RESECTION OF BLADDER TUMOR N/A 07/18/2018   Procedure: TRANSURETHRAL RESECTION OF BLADDER TUMOR (TURBT);  Surgeon: [name], MD;  Location: [facility];  Service: Urology;  Laterality: N/A;    Prior to Admission medications   Medication Sig Start Date End Date Taking? Authorizing Provider  acetaminophen (TYLENOL) 500 MG tablet Take 1,000 mg by mouth every 6 (six) hours as needed for moderate pain or headache.     [provider]  cetirizine (ZYRTEC) 10 MG tablet Take 10 mg by mouth daily as needed for allergies.     [provider]  Cranberry-Cholecalciferol (SUPER CRANBERRY/VITAMIN D3) 4200-500 MG-UNIT CAPS Take 1 capsule by mouth 2 (two) times daily.    [provider]  dexamethasone (DECADRON) 4 MG tablet Take 2 tablets (8 mg total) by mouth daily. Start the day after chemotherapy for 2 days. 08/01/19   Sindy Guadeloupe, MD  feeding supplement, ENSURE ENLIVE, (ENSURE ENLIVE) LIQD Take 237 mLs by mouth 2 (two) times daily between meals. 06/11/19   Bettey Costa, MD  ferrous sulfate 325 (65 FE) MG tablet Take 1 tablet (325  mg total) by mouth 2 (two) times daily with a meal. 05/06/18   Gouru, Aruna, MD  Glycerin-Hypromellose-PEG 400 (DRY EYE RELIEF DROPS OP) Apply 1 drop to eye daily.    [provider]  lidocaine-prilocaine (EMLA) cream Apply 1 application topically as needed. 01/02/19   Sindy Guadeloupe, MD  Multiple Vitamin (MULTIVITAMIN WITH MINERALS) TABS tablet Take 1 tablet by mouth daily.    [provider]  MYRBETRIQ 50 MG TB24 tablet TAKE 1 TABLET BY MOUTH DAILY Patient taking differently: Take 50 mg by mouth daily.  04/30/19   Zara Council A, PA-C   nystatin [facility]) powder Apply topically 2 (two) times daily. Patient taking differently: Apply 1 g topically 2 (two) times daily as needed.  09/28/18   Zara Council A, PA-C  ondansetron (ZOFRAN) 8 MG tablet Take 1 tablet (8 mg total) by mouth 2 (two) times daily as needed for refractory nausea / vomiting. Start on day 3 after chemo. 08/01/19   Sindy Guadeloupe, MD  oxyCODONE (OXY IR/ROXICODONE) 5 MG immediate release tablet Take 1 tablet (5 mg total) by mouth every 4 (four) hours as needed for severe pain. 03/21/19   Sindy Guadeloupe, MD  polyethylene glycol powder (MIRALAX) powder Take 17 g by mouth daily as needed. Can increase to 3 times a day as needed for constipation but hold medication if has diarrhea 01/30/18   Sindy Guadeloupe, MD  prochlorperazine (COMPAZINE) 10 MG tablet Take 1 tablet (10 mg total) by mouth every 6 (six) hours as needed (Nausea or vomiting). 08/01/19   Sindy Guadeloupe, MD  vitamin B-12 (CYANOCOBALAMIN) 500 MCG tablet Take 500 mcg by mouth daily.    [provider]  zinc oxide 20 % ointment Apply 1 application topically as needed for irritation.    [provider]    No Known Allergies  Family History  Problem Relation Age of Onset  . Cancer Mother   . Chronic Renal Failure Mother   . Heart disease Father     Social History Social History   Tobacco Use  . Smoking status: Never Smoker  . Smokeless tobacco: Never Used  Substance Use Topics  . Alcohol use: No    Alcohol/week: 0.0 standard drinks  . Drug use: No    Review of Systems Constitutional: Intermittent fevers at home including yesterday. Cardiovascular: Negative for chest pain. Respiratory: Negative for shortness of breath. Gastrointestinal: Tenderness over the lower abdomen with positive drainage from this area.  Musculoskeletal: Negative for musculoskeletal complaints Skin: Drainage from lower abdomen. Neurological: Negative for headache All other ROS  negative  ____________________________________________   PHYSICAL EXAM:  VITAL SIGNS: ED Triage Vitals  Enc Vitals Group     BP 10/05/19 1421 115/80     Pulse Rate 10/05/19 1421 (!) 130     Resp 10/05/19 1421 16     Temp 10/05/19 1421 98.6 F (37 C)     Temp Source 10/05/19 1421 Oral     SpO2 10/05/19 1421 94 %     Weight 10/05/19 1418 145 lb (65.8 kg)     Height 10/05/19 1418 5\' 11"  (1.803 m)     Head Circumference --      Peak Flow --      Pain Score 10/05/19 1417 5     Pain Loc --      Pain Edu? --      Excl. in [place]? --    Constitutional: Alert and oriented. Well appearing and in no  distress. Eyes: Normal exam ENT      Head: Normocephalic and atraumatic.      Mouth/Throat: Mucous membranes are moist. Cardiovascular: Normal rate, regular rhythm.  Respiratory: Normal respiratory effort without tachypnea nor retractions. Breath sounds are clear Gastrointestinal: Soft overall with moderate tenderness to the suprapubic area where there is small amount of drainage.  When pressure is applied there is significant drainage consistent with intra-abdominal abscess. Musculoskeletal: Nontender with normal range of motion in all extremities. Neurologic:  Normal speech and language. No gross focal neurologic deficits  Skin: Drainage from lower abdomen. Psychiatric: Mood and affect are normal.   ____________________________________________    RADIOLOGY  1. Progressively enlarging heterogeneously enhancing bladder mass  which now appears to have eroded through the inferior aspect of the  anterior abdominal wall musculature, with small superficial abscess  just deep to the skin surface in the low anatomic pelvis.  2. Bilateral nephrostomy tubes are stable in position. No  hydroureteronephrosis.  3. Small right pleural effusion lying dependently.  4. Aortic atherosclerosis, in addition to least 3 vessel coronary  artery disease.  5. Additional incidental findings, as above,  similar to prior  studies.   ____________________________________________   INITIAL IMPRESSION / ASSESSMENT AND PLAN / ED COURSE  Pertinent labs & imaging results that were available during my care of the patient were reviewed by me and considered in my medical decision making (see chart for details).   Patient presents to the emergency department for drainage from his lower abdomen.  Recent discharge from the hospital after an admission for sepsis due to unknown organism.  Patient has a history of chronic leukocytosis typically white blood cell counts in the upper 20s today at 63,000.  I examination patient has mild drainage in the suprapubic area when pressure is applied there is significant drainage consistent with intra-abdominal abscess.  This would likely explain the source of the patient's intermittent fevers she has been experiencing.  Currently he is tachycardic 130 with a white blood cell count of 63,000 and a lactic acid of 2.7 meeting sepsis criteria once again.  We will proceed with a CT scan of the abdomen with contrast to see how large the abscess is.  I would anticipate the need for admission to the hospital for IV antibiotics and likely abscess drainage.  CT shows worsening bladder mass as well as superficial abscess to the skin.  Urinalysis appears to be significantly infected.  I believe the patient will require repeat admission to the hospital given significant leukocytosis reported fever at home and tachycardia meeting sepsis criteria.  We will admit to the hospital service for continued IV antibiotics.    Vincent Poole was evaluated in Emergency Department on 10/05/2019 for the symptoms described in the history of present illness. He was evaluated in the context of the global COVID-19 pandemic, which necessitated consideration that the patient might be at risk for infection with the SARS-CoV-2 virus that causes COVID-19. Institutional protocols and algorithms that pertain to  the evaluation of patients at risk for COVID-19 are in a state of rapid change based on information released by regulatory bodies including the CDC and federal and state organizations. These policies and algorithms were followed during the patient's care in the ED.  CRITICAL CARE Performed by: Harvest Dark   Total critical care time: 30 minutes  Critical care time was exclusive of separately billable procedures and treating other patients.  Critical care was necessary to treat or prevent imminent or life-threatening deterioration.  Critical care was time spent personally by me on the following activities: development of treatment plan with patient and/or surrogate as well as nursing, discussions with consultants, evaluation of patient's response to treatment, examination of patient, obtaining history from patient or surrogate, ordering and performing treatments and interventions, ordering and review of laboratory studies, ordering and review of radiographic studies, pulse oximetry and re-evaluation of patient's condition.   EKG viewed and interpreted by myself shows sinus tachycardia 111 bpm with a narrow QRS, normal axis, normal intervals, nonspecific ST changes.  Possible demand ischemia.   ____________________________________________   FINAL CLINICAL IMPRESSION(S) / ED DIAGNOSES  Intra-abdominal abscess Sepsis Urinary tract infection   Harvest Dark, MD 10/05/19 Bufford Spikes, MD 10/23/19 1704

## 2019-10-05 NOTE — ED Notes (Signed)
Returned from ct awaiting results and plan of care.

## 2019-10-05 NOTE — ED Notes (Signed)
Assumed care of patient patient reports some bleeding at his tumor site, as per wife called his doctor and was advised to go to ed and have it evaluated. Patient with towel in area, serous drainage note, no bright red bleeding noted. Patient scheduled for chemo on Monday.

## 2019-10-05 NOTE — ED Notes (Signed)
1lt normal saline infusing 1st ABX infusing patient awaiting ct abd.

## 2019-10-05 NOTE — ED Notes (Signed)
This contacted [facility] to transport pt to room 207.

## 2019-10-05 NOTE — ED Notes (Signed)
This Rn attempted to provide report to room assigned room 207 w/o success. Floor RN will this RN for report.

## 2019-10-05 NOTE — ED Notes (Signed)
Dinner tray provided

## 2019-10-05 NOTE — Progress Notes (Signed)
CODE SEPSIS - PHARMACY COMMUNICATION  **Broad Spectrum Antibiotics should be administered within 1 hour of Sepsis diagnosis**  Time Code Sepsis Called/Page Received: 11/28 @ 1548  Antibiotics Ordered: ceftriaxone, flagyl  Time of 1st antibiotic administration: 11/28 @ 1643  Additional action taken by pharmacy: Called patient's nurse to follow-up with antibiotic administration within 1 hour window.  If necessary, Name of Provider/Nurse Contacted: Jeanette RN    Gerald Dexter ,PharmD 10/05/2019  4:43 PM

## 2019-10-05 NOTE — ED Notes (Signed)
Patient off unit to ct  

## 2019-10-05 NOTE — ED Notes (Signed)
Abd dressing changed by this RN. Clear drainage noted upon dressing change. Pt denies pain at this time.

## 2019-10-05 NOTE — H&P (Signed)
History and Physical    JANE BROUGHTON JGG:[id number] DOB: 04-08-1935 DOA: 10/05/2019  PCP: [name], MD  Patient coming from: home   Chief Complaint: drainage from abdomen  HPI: Vincent Poole is a 84 y.o. male with medical history significant for locally-metastatic bladder cancer on chemotherapy, urinary tract obstruction with b/l nephrostomy tubes in place, recurrent UTI, htn, colostomy in place, provoked dvt s/p anticoagulation, who presents with above.  Recent admission, discharged one week ago, for uti sepsis. Discharged on keflex.  Pt says there has been a red "lump" on his lower abdomen in the suprapubic region for several months. This was noted at recent hospitalization, thought to be 2/2 metastatic bladder cancer. Patient reports that since discharge has had occasional fever, last 2 days ago. Otherwise no significant pain, no cough or sob, no chest pain. Tolerating diet, no diarrhea. Today the red lump on lower abdomen opened and began draining white bloody fluid, and the patient presented to the ED. \ Lives at home with wife. Mostly bed-bound.  ED Course: manual expression of, per ED provider, approximately 30 cc pus from suprapubic wound. Keflex and metronidazole given. Labs and CT abdomen/pelvis ordered.  Review of Systems: As per HPI otherwise 10 point review of systems negative.    Past Medical History:  Diagnosis Date   Anxiety    Arthritis    Bladder cancer ([place])    Dysrhythmia 07/2018   history of atrial flutter that worsens with anxiety   Femur fracture, right ([place]) 05/01/2018   GERD (gastroesophageal reflux disease)    History of recent blood transfusion 05/2018   Hypertension    Iron deficiency anemia 09/14/2018   Prostate cancer ([place]) 07/2018   cancer growing in prostate but not prostate cancer, it is from the bladder   Umbilical hernia 54/6503   Urinary retention 2019   foley catheter place 11/2017   UTI (urinary tract  infection) 2019   frequent UTI's over last year   Wound eschar of foot 07/2018   left heal getting wrapped and requiring antibiotic cream. cracks open with weight bearing.    Past Surgical History:  Procedure Laterality Date   CARPAL TUNNEL RELEASE Right    CHOLECYSTECTOMY  2004   COLOSTOMY N/A 06/08/2019   Procedure: COLOSTOMY;  Surgeon: Herbert Pun, MD;  Location: [facility];  Service: General;  Laterality: N/A;   CYSTOGRAM  07/18/2018   Procedure: CYSTOGRAM;  Surgeon: [name], MD;  Location: [facility];  Service: Urology;;   Consuela Mimes W/ RETROGRADES Bilateral 07/18/2018   Procedure: CYSTOSCOPY WITH RETROGRADE PYELOGRAM;  Surgeon: [name], MD;  Location: [facility];  Service: Urology;  Laterality: Bilateral;   CYSTOSCOPY W/ URETERAL STENT PLACEMENT Bilateral 12/27/2017   Procedure: CYSTOSCOPY WITH RETROGRADE PYELOGRAM/URETERAL STENT PLACEMENT;  Surgeon: [name], MD;  Location: [facility];  Service: Urology;  Laterality: Bilateral;   CYSTOSCOPY W/ URETERAL STENT PLACEMENT Bilateral 07/18/2018   Procedure: CYSTOSCOPY WITH STENT REPLACEMENT (exchange);  Surgeon: [name], MD;  Location: [facility];  Service: Urology;  Laterality: Bilateral;   CYSTOSCOPY WITH STENT PLACEMENT Bilateral 11/12/2018   Procedure: [place] WITH STENT Exchange;  Surgeon: [name], MD;  Location: [facility];  Service: Urology;  Laterality: Bilateral;   INTRAMEDULLARY (IM) NAIL INTERTROCHANTERIC Right 05/02/2018   Procedure: INTRAMEDULLARY (IM) NAIL INTERTROCHANTRIC;  Surgeon: [name], MD;  Location: [facility];  Service: Orthopedics;  Laterality: Right;   IR NEPHROSTOMY EXCHANGE LEFT  03/25/2019   IR NEPHROSTOMY EXCHANGE LEFT  04/19/2019   IR  NEPHROSTOMY EXCHANGE LEFT  05/31/2019   IR NEPHROSTOMY EXCHANGE LEFT  07/19/2019   IR NEPHROSTOMY EXCHANGE LEFT  09/13/2019   IR NEPHROSTOMY EXCHANGE RIGHT  03/25/2019   IR NEPHROSTOMY EXCHANGE RIGHT  04/19/2019   IR  NEPHROSTOMY EXCHANGE RIGHT  05/31/2019   IR NEPHROSTOMY EXCHANGE RIGHT  07/19/2019   IR NEPHROSTOMY EXCHANGE RIGHT  09/13/2019   IR NEPHROSTOMY PLACEMENT LEFT  02/23/2019   IR NEPHROSTOMY PLACEMENT RIGHT  02/23/2019   LAPAROTOMY N/A 06/08/2019   Procedure: EXPLORATORY LAPAROTOMY;  Surgeon: Herbert Pun, MD;  Location: [facility];  Service: General;  Laterality: N/A;   LEG TENDON SURGERY Right 1958   PORTA CATH INSERTION N/A 01/22/2018   Procedure: PORTA CATH INSERTION;  Surgeon: [name], MD;  Location: [facility];  Service: Cardiovascular;  Laterality: N/A;   TRANSURETHRAL RESECTION OF BLADDER TUMOR N/A 12/27/2017   Procedure: TRANSURETHRAL RESECTION OF BLADDER TUMOR (TURBT);  Surgeon: [name], MD;  Location: [facility];  Service: Urology;  Laterality: N/A;   TRANSURETHRAL RESECTION OF BLADDER TUMOR N/A 01/15/2018   Procedure: TRANSURETHRAL RESECTION OF BLADDER TUMOR (TURBT);  Surgeon: [name], MD;  Location: [facility];  Service: Urology;  Laterality: N/A;  Need 2 hrs for this case please   TRANSURETHRAL RESECTION OF BLADDER TUMOR N/A 07/18/2018   Procedure: TRANSURETHRAL RESECTION OF BLADDER TUMOR (TURBT);  Surgeon: [name], MD;  Location: [facility];  Service: Urology;  Laterality: N/A;     reports that he has never smoked. He has never used smokeless tobacco. He reports that he does not drink alcohol or use drugs.  No Known Allergies  Family History  Problem Relation Age of Onset   Cancer Mother    Chronic Renal Failure Mother    Heart disease Father     Prior to Admission medications   Medication Sig Start Date End Date Taking? Authorizing Provider  acetaminophen (TYLENOL) 500 MG tablet Take 1,000 mg by mouth every 6 (six) hours as needed for moderate pain or headache.    Yes [provider]  cetirizine (ZYRTEC) 10 MG tablet Take 10 mg by mouth daily as needed for allergies.    Yes [provider]    Cranberry-Cholecalciferol (SUPER CRANBERRY/VITAMIN D3) 4200-500 MG-UNIT CAPS Take 1 capsule by mouth 2 (two) times daily.   Yes [provider]  dexamethasone (DECADRON) 4 MG tablet Take 2 tablets (8 mg total) by mouth daily. Start the day after chemotherapy for 2 days. 08/01/19  Yes Sindy Guadeloupe, MD  feeding supplement, ENSURE ENLIVE, (ENSURE ENLIVE) LIQD Take 237 mLs by mouth 2 (two) times daily between meals. 06/11/19  Yes Bettey Costa, MD  ferrous sulfate 325 (65 FE) MG tablet Take 1 tablet (325 mg total) by mouth 2 (two) times daily with a meal. 05/06/18  Yes Gouru, Aruna, MD  Glycerin-Hypromellose-PEG 400 (DRY EYE RELIEF DROPS OP) Apply 1 drop to eye daily as needed.    Yes [provider]  lidocaine-prilocaine (EMLA) cream Apply 1 application topically as needed. 01/02/19  Yes Sindy Guadeloupe, MD  Multiple Vitamin (MULTIVITAMIN WITH MINERALS) TABS tablet Take 1 tablet by mouth daily.   Yes [provider]  MYRBETRIQ 50 MG TB24 tablet TAKE 1 TABLET BY MOUTH DAILY Patient taking differently: Take 50 mg by mouth daily.  04/30/19  Yes McGowan, Larene Beach A, PA-C  nystatin [facility]) powder Apply topically 2 (two) times daily. Patient taking differently: Apply 1 g topically 2 (two) times daily as needed.  09/28/18  Yes McGowan, Shannon A, PA-C  ondansetron (ZOFRAN) 8 MG tablet Take 1 tablet (8 mg total) by mouth 2 (two) times daily as needed for refractory nausea / vomiting. Start on day 3 after chemo. 08/01/19  Yes Sindy Guadeloupe, MD  oxyCODONE (OXY IR/ROXICODONE) 5 MG immediate release tablet Take 1 tablet (5 mg total) by mouth every 4 (four) hours as needed for severe pain. 03/21/19  Yes Sindy Guadeloupe, MD  polyethylene glycol powder (MIRALAX) powder Take 17 g by mouth daily as needed. Can increase to 3 times a day as needed for constipation but hold medication if has diarrhea 01/30/18  Yes Sindy Guadeloupe, MD  prochlorperazine (COMPAZINE) 10 MG tablet Take 1 tablet (10 mg total) by  mouth every 6 (six) hours as needed (Nausea or vomiting). 08/01/19  Yes Sindy Guadeloupe, MD  vitamin B-12 (CYANOCOBALAMIN) 500 MCG tablet Take 500 mcg by mouth daily.   Yes [provider]  zinc oxide 20 % ointment Apply 1 application topically as needed for irritation.   Yes [provider]    Physical Exam: Vitals:   10/05/19 1630 10/05/19 1700 10/05/19 1800 10/05/19 1830  BP: 122/69 112/64 111/66 100/78  Pulse: (!) 112 99 (!) 106 (!) 107  Resp: (!) 21 (!) 22 19 19   Temp:      TempSrc:      SpO2: 95% 96% 96% 99%  Weight:      Height:        Constitutional: No acute distress, chronically ill appearing Head: Atraumatic Eyes: Conjunctiva clear ENM: Moist mucous membranes. poordentition.  Neck: Supple Respiratory: Clear to auscultation bilaterally, no wheezing/rales/rhonchi. Normal respiratory effort. No accessory muscle use. . Cardiovascular: Regular rate and rhythm. Soft systolic murmur. Abdomen: suprapubically there is an erythematous nodule that drains thin purulent material, no fluctuance. Induration of about 3 cm. Tender to palpation over the indurated area. No rebound on exam of abdomen, no guarding. Ostomy margins c/d/i. B/l nephrostomy tubes no drainage at sites Musculoskeletal: No joint deformity upper and lower extremities. Normal ROM, no contractures. Decreased muscle tone.  Skin: see above. Erythema sacrum. Extremities: No peripheral edema. Palpable peripheral pulses. Neurologic: Alert, moving all 4 extremities. Hard of hearing Psychiatric: Normal insight and judgement.   Labs on Admission: I have personally reviewed following labs and imaging studies  CBC: Recent Labs  Lab 10/05/19 1427  WBC 63.7*  NEUTROABS 51.9*  HGB 11.9*  HCT 36.4*  MCV 85.0  PLT 983   Basic Metabolic Panel: Recent Labs  Lab 10/05/19 1427  NA 133*  K 3.8  CL 100  CO2 21*  GLUCOSE 133*  BUN 20  CREATININE 0.92  CALCIUM 8.5*   GFR: Estimated Creatinine  Clearance: 57.6 mL/min (by C-G formula based on SCr of 0.92 mg/dL). Liver Function Tests: Recent Labs  Lab 10/05/19 1427  AST 13*  ALT 9  ALKPHOS 130*  BILITOT 0.6  PROT 6.7  ALBUMIN 2.9*   No results for input(s): LIPASE, AMYLASE in the last 168 hours. No results for input(s): AMMONIA in the last 168 hours. Coagulation Profile: No results for input(s): INR, PROTIME in the last 168 hours. Cardiac Enzymes: No results for input(s): CKTOTAL, CKMB, CKMBINDEX, TROPONINI in the last 168 hours. BNP (last 3 results) No results for input(s): PROBNP in the last 8760 hours. HbA1C: No results for input(s): HGBA1C in the last 72 hours. CBG: No results for input(s): GLUCAP in the last 168 hours. Lipid Profile: No results for input(s): CHOL, HDL,  LDLCALC, TRIG, CHOLHDL, LDLDIRECT in the last 72 hours. Thyroid Function Tests: No results for input(s): TSH, T4TOTAL, FREET4, T3FREE, THYROIDAB in the last 72 hours. Anemia Panel: No results for input(s): VITAMINB12, FOLATE, FERRITIN, TIBC, IRON, RETICCTPCT in the last 72 hours. Urine analysis:    Component Value Date/Time   COLORURINE AMBER (A) 10/05/2019 1618   APPEARANCEUR TURBID (A) 10/05/2019 1618   APPEARANCEUR Cloudy (A) 10/23/2018 1327   LABSPEC 1.024 10/05/2019 1618   PHURINE 5.0 10/05/2019 1618   GLUCOSEU NEGATIVE 10/05/2019 1618   HGBUR NEGATIVE 10/05/2019 1618   BILIRUBINUR NEGATIVE 10/05/2019 1618   BILIRUBINUR Negative 10/23/2018 1327   KETONESUR 5 (A) 10/05/2019 1618   PROTEINUR 100 (A) 10/05/2019 1618   NITRITE NEGATIVE 10/05/2019 1618   LEUKOCYTESUR LARGE (A) 10/05/2019 1618    Radiological Exams on Admission: Ct Abdomen Pelvis W Contrast  Result Date: 10/05/2019 CLINICAL DATA:  84 year old male with history of lower abdominal pain from an abscess. EXAM: CT ABDOMEN AND PELVIS WITH CONTRAST TECHNIQUE: Multidetector CT imaging of the abdomen and pelvis was performed using the standard protocol following bolus  administration of intravenous contrast. CONTRAST:  140mL OMNIPAQUE IOHEXOL 300 MG/ML  SOLN COMPARISON:  CT the abdomen and pelvis 09/25/2019. FINDINGS: Lower chest: Small right pleural effusion lying dependently. Areas of subsegmental atelectasis in the lower lobes of the lungs bilaterally. Atherosclerotic calcifications in the descending thoracic aorta as well as the left anterior descending, left circumflex and right coronary arteries. Hepatobiliary: No suspicious cystic or solid hepatic lesions. No intra or extrahepatic biliary ductal dilatation. Status post cholecystectomy. Pancreas: No pancreatic mass. No pancreatic ductal dilatation. No pancreatic or peripancreatic fluid collections or inflammatory changes. Spleen: Unremarkable. Adrenals/Urinary Tract: Percutaneous nephrostomy tubes are noted with tips reformed in the renal collecting systems bilaterally. 2.8 cm low-attenuation lesion in the anterior aspect of the interpolar region of the right kidney, compatible with a simple cyst. Subcentimeter low-attenuation lesion in the anterior aspect of the lower pole the left kidney, too small to characterize, but statistically likely to represent a tiny cyst. No hydroureteronephrosis. Urinary bladder is enlarged measuring approximately 8.6 x 14.5 x 8.8 cm and grossly distorted with extensive multifocal mural thickening and widespread areas of internal soft tissue thickening and enhancing soft tissue, with a combination of fluid and gas within the bladder, concerning for progressively enlarging bladder neoplasm. This appears to extend through the lower anterior abdominal wall where there is a small gas and fluid collection in the subcutaneous fat immediately superficial to the anterior abdominal wall musculature measuring 8.0 x 1.7 x 5.0 cm (axial image 68 of series 2 and sagittal image 64 of series 6). Bilateral adrenal glands are normal in appearance. Stomach/Bowel: Normal appearance of the stomach. No pathologic  dilatation of small bowel or colon. A few scattered colonic diverticulae are noted, without surrounding inflammatory changes to suggest an acute diverticulitis at this time. Mid transverse colon loop colostomy. Normal appendix. Vascular/Lymphatic: Aortic atherosclerosis, without evidence of aneurysm or dissection in the abdominal or pelvic vasculature. No lymphadenopathy noted in the abdomen or pelvis. Reproductive: Prostate gland is completely obscured by the adjacent bladder mass, likely from direct invasion. Seminal vesicles are not confidently identified. Other: No significant volume of ascites.  No pneumoperitoneum. Musculoskeletal: There are no aggressive appearing lytic or blastic lesions noted in the visualized portions of the skeleton. IMPRESSION: 1. Progressively enlarging heterogeneously enhancing bladder mass which now appears to have eroded through the inferior aspect of the anterior abdominal wall musculature, with small superficial abscess  just deep to the skin surface in the low anatomic pelvis. 2. Bilateral nephrostomy tubes are stable in position. No hydroureteronephrosis. 3. Small right pleural effusion lying dependently. 4. Aortic atherosclerosis, in addition to least 3 vessel coronary artery disease. 5. Additional incidental findings, as above, similar to prior studies. Electronically Signed   By: Vinnie Langton M.D.   On: 10/05/2019 18:10    Assessment/Plan Principal Problem:   Intra-abdominal infection Active Problems:   Sepsis ([place])   Essential hypertension   Prostate cancer ([facility])   Urinary obstruction   Malignant neoplasm of urinary bladder ([facility])   Pressure injury of skin   S/P partial resection of colon   # Abscess with sepsis - secondary to locally metastatic bladder cancer that has now eroded through the anterior abdominal wall. At the very least there appears to be an abscess of the anterior abdominal wall, most of the contents of which expressed by EDP, CT performed  after that showing small superficial abscess and no sig fluctuance on my exam. This may communicate intraabdominally. Presented with sepsis picture: tachycardic, elevated wbcs, elevated lactate. After fluid resuscitation lactate normal, pt well appearing. - tonight iv abx with vancomycin, cefepime, and metronidazole - npo after midnight, likely gen surg vs IR consult, will also need oncology's input given metastatic disease is the underlying cause - local wound care - d51/2ns @ 100 - f/u blood and urine cultures. Wound culture not obtained by ed but likely polymycrobial - f/u covid test - cont home oxy 5 prn  # Metastatic bladder cancer - on padcev, last dose earlier this month per wife, next dose due next month. Plan to start radiation therapy on 11/30. - plan for inpt oncology consult  # leukocytosis - chronic leukocytosis thought to be 2/2 malignancy per onc, baseline of around 30-40, today wbcs 63.7, likely result of infection  # bilateral nephrostomuy tubes - 2/2 hx bladder obstruction - ostomy care -cont home myrbetric  # colostomy - outpt per wife and patient is normal - ostomy care  # stage 1 sacral decub - wound care  DVT prophylaxis: lovenox Code Status: dnr, confirmed w/ wife and pt  Family Communication: wife  Disposition Plan: tbd  Consults called: none thus far  Admission status: med/surg    Desma Maxim MD [facility] Pager [phone]  If 7PM-7AM, please contact night-coverage [email or web address] Password [facility]  10/05/2019, 7:22 PM

## 2019-10-06 DIAGNOSIS — C61 Malignant neoplasm of prostate: Secondary | ICD-10-CM

## 2019-10-06 DIAGNOSIS — I10 Essential (primary) hypertension: Secondary | ICD-10-CM

## 2019-10-06 DIAGNOSIS — A4101 Sepsis due to Methicillin susceptible Staphylococcus aureus: Secondary | ICD-10-CM

## 2019-10-06 DIAGNOSIS — D65 Disseminated intravascular coagulation [defibrination syndrome]: Secondary | ICD-10-CM

## 2019-10-06 DIAGNOSIS — R6520 Severe sepsis without septic shock: Secondary | ICD-10-CM

## 2019-10-06 LAB — CBC
HCT: 31.8 % — ABNORMAL LOW (ref 39.0–52.0)
Hemoglobin: 10.4 g/dL — ABNORMAL LOW (ref 13.0–17.0)
MCH: 28.3 pg (ref 26.0–34.0)
MCHC: 32.7 g/dL (ref 30.0–36.0)
MCV: 86.4 fL (ref 80.0–100.0)
Platelets: 329 10*3/uL (ref 150–400)
RBC: 3.68 MIL/uL — ABNORMAL LOW (ref 4.22–5.81)
RDW: 18.5 % — ABNORMAL HIGH (ref 11.5–15.5)
WBC: 51.9 10*3/uL (ref 4.0–10.5)
nRBC: 0 % (ref 0.0–0.2)

## 2019-10-06 LAB — SARS CORONAVIRUS 2 (TAT 6-24 HRS): SARS Coronavirus 2: NEGATIVE

## 2019-10-06 LAB — COMPREHENSIVE METABOLIC PANEL
ALT: 7 U/L (ref 0–44)
AST: 10 U/L — ABNORMAL LOW (ref 15–41)
Albumin: 2.4 g/dL — ABNORMAL LOW (ref 3.5–5.0)
Alkaline Phosphatase: 125 U/L (ref 38–126)
Anion gap: 9 (ref 5–15)
BUN: 17 mg/dL (ref 8–23)
CO2: 22 mmol/L (ref 22–32)
Calcium: 8.4 mg/dL — ABNORMAL LOW (ref 8.9–10.3)
Chloride: 106 mmol/L (ref 98–111)
Creatinine, Ser: 0.86 mg/dL (ref 0.61–1.24)
GFR calc Af Amer: >60 mL/min (ref >60)
GFR calc non Af Amer: >60 mL/min (ref >60)
Glucose, Bld: 118 mg/dL — ABNORMAL HIGH (ref 70–99)
Potassium: 3.4 mmol/L — ABNORMAL LOW (ref 3.5–5.1)
Sodium: 137 mmol/L (ref 135–145)
Total Bilirubin: 0.6 mg/dL (ref 0.3–1.2)
Total Protein: 5.7 g/dL — ABNORMAL LOW (ref 6.5–8.1)

## 2019-10-06 MED ORDER — POTASSIUM CHLORIDE CRYS ER 20 MEQ PO TBCR
40.0000 meq | EXTENDED_RELEASE_TABLET | Freq: Once | ORAL | Status: AC
  Administered 2019-10-06: 40 meq via ORAL
  Filled 2019-10-06: qty 2

## 2019-10-06 MED ORDER — CHLORHEXIDINE GLUCONATE CLOTH 2 % EX PADS
6.0000 | MEDICATED_PAD | Freq: Every day | CUTANEOUS | Status: DC
  Administered 2019-10-06 – 2019-10-08 (×3): 6 via TOPICAL

## 2019-10-06 MED ORDER — DEXTROSE-NACL 5-0.45 % IV SOLN
INTRAVENOUS | Status: DC
  Administered 2019-10-07 (×3): via INTRAVENOUS

## 2019-10-06 MED ORDER — VANCOMYCIN HCL 1.5 G IV SOLR
1500.0000 mg | INTRAVENOUS | Status: DC
  Administered 2019-10-06 – 2019-10-09 (×4): 1500 mg via INTRAVENOUS
  Filled 2019-10-06 (×5): qty 1500

## 2019-10-06 MED ORDER — SODIUM CHLORIDE 0.9 % IV SOLN
2.0000 g | Freq: Three times a day (TID) | INTRAVENOUS | Status: DC
  Administered 2019-10-06 – 2019-10-10 (×12): 2 g via INTRAVENOUS
  Filled 2019-10-06 (×14): qty 2

## 2019-10-06 NOTE — Progress Notes (Signed)
PROGRESS NOTE    Vincent Poole  LFY:[id number] DOB: Sep 19, 1935 DOA: 10/05/2019 PCP: [name], MD   Brief Narrative:  Vincent Poole is a 84 y.o. male with medical history significant for locally-metastatic bladder cancer on chemotherapy, urinary tract obstruction with b/l nephrostomy tubes in place, recurrent UTI, htn, colostomy in place, provoked dvt s/p anticoagulation, who presents with above.  Recent admission, discharged one week ago, for uti sepsis. Discharged on keflex.  Pt says there has been a red "lump" on his lower abdomen in the suprapubic region for several months. This was noted at recent hospitalization, thought to be 2/2 metastatic bladder cancer. Patient reports that since discharge has had occasional fever, last 2 days ago. Otherwise no significant pain, no cough or sob, no chest pain. Tolerating diet, no diarrhea. Today the red lump on lower abdomen opened and began draining white bloody fluid, and the patient presented to the ED.  Lives at home with wife. Mostly bed-bound.  ED Course: manual expression of, per ED provider, approximately 30 cc pus from suprapubic wound. Keflex and metronidazole given. Labs and CT abdomen/pelvis ordered.   Assessment & Plan:   Principal Problem:   Intra-abdominal infection Active Problems:   Sepsis ([place])   Essential hypertension   Prostate cancer (H[facility])   Urinary obstruction   Malignant neoplasm of urinary bladder ([facility]CC)   Pressure injury of skin   S/P partial resection of colon   Abscess with sepsis - secondary to locally metastatic bladder cancer concerning for erosion through the anterior abdominal wall.  -Continue with broad-spectrum antibiotics including Vanco cefepime and metronidazole an additional day with plans for rapid de-escalation pending culture results -Continue local wound care -Blood and urine cultures collected -General surgery consultation for opinion regarding surgical intervention    Metastatic bladder cancer - on padcev, last dose earlier this month per wife, next dose due next month. Plan to start radiation therapy on 11/30. -We will treat an additional day and discuss with his primary oncologist tomorrow  Acute on chronic leukocytosis  -Reviewed medical history patient appears to be have a chronic leukocytosis -Of note it was normal approximately 1 year ago, patient is being followed by oncology -Likely worse because of superimposed infection -We will follow-up cultures continue antibiotics  bilateral nephrostomuy tubes - 2/2 hx bladder obstruction - ostomy care -cont home myrbetric  colostomy - outpt per wife and patient is normal - ostomy care  stage 1 sacral decub POA - wound care  DVT prophylaxis: Lovenox SQ  Code Status: DNR    Code Status Orders  (From admission, onward)         Start     Ordered   10/05/19 1925  Do not attempt resuscitation (DNR)  Continuous    Question Answer Comment  In the event of cardiac or respiratory ARREST Do not call a code blue   In the event of cardiac or respiratory ARREST Do not perform Intubation, CPR, defibrillation or ACLS   In the event of cardiac or respiratory ARREST Use medication by any route, position, wound care, and other measures to relive pain and suffering. May use oxygen, suction and manual treatment of airway obstruction as needed for comfort.      10/05/19 1924        Code Status History    Date Active Date Inactive Code Status Order ID Comments User Context   09/25/2019 2326 09/28/2019 1718 DNR [id number]  Rise Patience, MD ED   09/25/2019 2206 09/25/2019 2326  DNR [id number]  Merlyn Lot, MD ED   06/07/2019 1651 06/12/2019 1937 DNR [id number]  Basilio Cairo, NP Inpatient   06/07/2019 0103 06/07/2019 1651 Full Code [id number]  Mayer Camel, NP Inpatient   04/26/2019 0420 04/29/2019 1913 Full Code [id number]  Mansy, Arvella Merles, MD ED   04/08/2019 1211 04/10/2019 1628 Full Code [id number]   Gladstone Lighter, MD ED   03/22/2019 2337 03/26/2019 1934 Full Code [id number]  Lance Coon, MD Inpatient   02/21/2019 1505 02/26/2019 1849 Full Code [id number]  Mayo, Pete Pelt, MD Inpatient   11/12/2018 1849 11/16/2018 1632 Full Code [id number]  Epifanio Lesches, MD Inpatient   05/01/2018 1702 05/06/2018 1711 Full Code [id number]  Dustin Flock, MD Inpatient   12/26/2017 2141 12/30/2017 1727 Full Code [id number]  Demetrios Loll, MD Inpatient   05/31/2017 0208 06/01/2017 1922 Full Code [id number]  Harvie Bridge, DO Inpatient   03/04/2017 1833 03/05/2017 1426 Full Code [id number]  Idelle Crouch, MD Inpatient   07/11/2016 0538 07/13/2016 1420 Full Code [id number]  Saundra Shelling, MD ED   Advance Care Planning Activity     Family Communication: TRIED CALLING WIFE, LM  Disposition Plan:   Patient remained in the hospital for IV antibiotics. Follow-up cultures, expert subspecialty consultation possible surgical intervention. Patient not medically ready or stable for discharge Consults called: General surgery Admission status: Inpatient   Consultants:   As above  Procedures:  Ct Head Wo Contrast  Result Date: 09/25/2019 CLINICAL DATA:  Seizure EXAM: CT HEAD WITHOUT CONTRAST TECHNIQUE: Contiguous axial images were obtained from the base of the skull through the vertex without intravenous contrast. COMPARISON:  None. FINDINGS: Brain: There is atrophy and chronic small vessel disease changes. No acute intracranial abnormality. Specifically, no hemorrhage, hydrocephalus, mass lesion, acute infarction, or significant intracranial injury. Vascular: No hyperdense vessel or unexpected calcification. Skull: No acute calvarial abnormality. Sinuses/Orbits: Visualized paranasal sinuses and mastoids clear. Orbital soft tissues unremarkable. Other: None IMPRESSION: Atrophy, chronic microvascular disease. No acute intracranial abnormality. Electronically Signed   By: Rolm Baptise M.D.   On: 09/25/2019 20:42   Ct Abdomen  Pelvis W Contrast  Result Date: 10/05/2019 CLINICAL DATA:  84 year old male with history of lower abdominal pain from an abscess. EXAM: CT ABDOMEN AND PELVIS WITH CONTRAST TECHNIQUE: Multidetector CT imaging of the abdomen and pelvis was performed using the standard protocol following bolus administration of intravenous contrast. CONTRAST:  128mL OMNIPAQUE IOHEXOL 300 MG/ML  SOLN COMPARISON:  CT the abdomen and pelvis 09/25/2019. FINDINGS: Lower chest: Small right pleural effusion lying dependently. Areas of subsegmental atelectasis in the lower lobes of the lungs bilaterally. Atherosclerotic calcifications in the descending thoracic aorta as well as the left anterior descending, left circumflex and right coronary arteries. Hepatobiliary: No suspicious cystic or solid hepatic lesions. No intra or extrahepatic biliary ductal dilatation. Status post cholecystectomy. Pancreas: No pancreatic mass. No pancreatic ductal dilatation. No pancreatic or peripancreatic fluid collections or inflammatory changes. Spleen: Unremarkable. Adrenals/Urinary Tract: Percutaneous nephrostomy tubes are noted with tips reformed in the renal collecting systems bilaterally. 2.8 cm low-attenuation lesion in the anterior aspect of the interpolar region of the right kidney, compatible with a simple cyst. Subcentimeter low-attenuation lesion in the anterior aspect of the lower pole the left kidney, too small to characterize, but statistically likely to represent a tiny cyst. No hydroureteronephrosis. Urinary bladder is enlarged measuring approximately 8.6 x 14.5 x 8.8 cm and grossly distorted with extensive multifocal mural thickening and widespread areas of internal soft tissue thickening and enhancing soft  tissue, with a combination of fluid and gas within the bladder, concerning for progressively enlarging bladder neoplasm. This appears to extend through the lower anterior abdominal wall where there is a small gas and fluid collection in  the subcutaneous fat immediately superficial to the anterior abdominal wall musculature measuring 8.0 x 1.7 x 5.0 cm (axial image 68 of series 2 and sagittal image 64 of series 6). Bilateral adrenal glands are normal in appearance. Stomach/Bowel: Normal appearance of the stomach. No pathologic dilatation of small bowel or colon. A few scattered colonic diverticulae are noted, without surrounding inflammatory changes to suggest an acute diverticulitis at this time. Mid transverse colon loop colostomy. Normal appendix. Vascular/Lymphatic: Aortic atherosclerosis, without evidence of aneurysm or dissection in the abdominal or pelvic vasculature. No lymphadenopathy noted in the abdomen or pelvis. Reproductive: Prostate gland is completely obscured by the adjacent bladder mass, likely from direct invasion. Seminal vesicles are not confidently identified. Other: No significant volume of ascites.  No pneumoperitoneum. Musculoskeletal: There are no aggressive appearing lytic or blastic lesions noted in the visualized portions of the skeleton. IMPRESSION: 1. Progressively enlarging heterogeneously enhancing bladder mass which now appears to have eroded through the inferior aspect of the anterior abdominal wall musculature, with small superficial abscess just deep to the skin surface in the low anatomic pelvis. 2. Bilateral nephrostomy tubes are stable in position. No hydroureteronephrosis. 3. Small right pleural effusion lying dependently. 4. Aortic atherosclerosis, in addition to least 3 vessel coronary artery disease. 5. Additional incidental findings, as above, similar to prior studies. Electronically Signed   By: Vinnie Langton M.D.   On: 10/05/2019 18:10   Ct Abdomen Pelvis W Contrast  Result Date: 09/25/2019 CLINICAL DATA:  Acute abdominal pain. Fever. EXAM: CT ABDOMEN AND PELVIS WITH CONTRAST TECHNIQUE: Multidetector CT imaging of the abdomen and pelvis was performed using the standard protocol following bolus  administration of intravenous contrast. CONTRAST:  163mL OMNIPAQUE IOHEXOL 300 MG/ML  SOLN COMPARISON:  09/09/2019 FINDINGS: Lower chest: Aortic atherosclerosis. Extensive coronary artery calcifications. Heart size is normal. No pericardial effusion. Tiny bilateral pleural effusions with minimal bibasilar atelectasis. Hepatobiliary: No focal liver abnormality is seen. Status post cholecystectomy. No biliary dilatation. Pancreas: Unremarkable. No pancreatic ductal dilatation or surrounding inflammatory changes. Spleen: Normal in size without focal abnormality. Adrenals/Urinary Tract: Adrenal glands are normal. Bilateral nephrostomy tubes in place. Slight dilatation of the right renal pelvis and proximal right ureter. 21 mm simple cyst on the mid right kidney. Again noted is a large irregular inhomogeneous mass in the bladder which appears larger than on the prior study. There are irregular fluid collections containing gas in that region consistent with infection. Gas collection protrudes through the midline of the anterior abdominal wall as demonstrated on the prior exam. Stomach/Bowel: Large amount of stool in the rectum consistent with a fecal impaction. Colostomy in the mid transverse colon. Appendix is normal. No significant change since the prior study. Vascular/Lymphatic: Aortic atherosclerosis. No enlarged abdominal or pelvic lymph nodes. Reproductive: Prostate gland is not identified. Other: No ascites. Musculoskeletal: No acute abnormality. Severe arthritic changes of both hips, right greater than left. IMPRESSION: 1. Interval enlargement of the large irregular inhomogeneous mass in the bladder consistent with the patient's known bladder carcinoma. 2. Large amount of stool in the rectum consistent with fecal impaction, unchanged. 3. Bilateral nephrostomy tubes in place. Slight dilatation of the right renal pelvis and proximal right ureter. 4. Tiny bilateral pleural effusions with minimal bibasilar  atelectasis. 5. Severe arthritic changes of both  hips, right greater than left. 6. Aortic Atherosclerosis (ICD10-I70.0). Electronically Signed   By: Lorriane Shire M.D.   On: 09/25/2019 20:47   Ct Abdomen Pelvis W Contrast  Result Date: 09/09/2019 CLINICAL DATA:  Restaging prostate cancer and urothelial carcinoma diagnosed 1 year ago. History of bilateral nephrostomies, partial colon resection and cholecystectomy. Reported area of erythema and swelling in the abdominal wall. EXAM: CT ABDOMEN AND PELVIS WITH CONTRAST TECHNIQUE: Multidetector CT imaging of the abdomen and pelvis was performed using the standard protocol following bolus administration of intravenous contrast. CONTRAST:  136mL OMNIPAQUE IOHEXOL 300 MG/ML  SOLN COMPARISON:  CT 08/08/2019 and 06/06/2019. FINDINGS: Lower chest: Stable mild emphysematous changes and scarring at both lung bases. The small right pleural effusion noted previously has resolved. Hepatobiliary: The liver is normal in density without suspicious focal abnormality. No significant biliary dilatation post cholecystectomy. Pancreas: Unremarkable. No pancreatic ductal dilatation or surrounding inflammatory changes. Spleen: Normal in size without focal abnormality. Adrenals/Urinary Tract: The adrenal glands appear stable without suspicious findings. Bilateral percutaneous nephrostomies remain in place. There is bilateral renal cortical thinning with contrast excretion into the renal collecting systems on the delayed images. There is a stable cyst anteriorly in the mid right kidney. No hydronephrosis. Grossly abnormal urinary bladder again noted with irregular bladder wall thickening and enhancement. There are solid enhancing components measuring 4.5 x 2.9 cm on image 69/2 and 4.5 x 3.0 cm on image 72/2. There are internal air-fluid levels within the bladder lumen with an enlarging diverticular like structure herniating between the rectus abdominus muscles. This now measures up to 7.7  x 7.0 cm on image 65/2 (previously 4.9 x 5.2 cm). Stomach/Bowel: No evidence of bowel wall thickening, distention or surrounding inflammatory change. There is a distal transverse colostomy. The appendix appears normal. A large amount of stool is present in the rectum. Vascular/Lymphatic: There are no enlarged abdominal or pelvic lymph nodes. Aortic and branch vessel atherosclerosis without acute vascular findings. Reproductive: Previous trans urethral resection of the prostate gland. Irregular enhancing nodularity along the bladder base, similar to previous study. Other: Diffuse soft tissue stranding and ill-defined fat planes along the pelvic sidewalls, similar to previous study. No ascites or free air. No focal inflammatory changes are identified within the anterior abdominal wall related to the colostomy or enlarging diverticulum projecting anteriorly from the bladder through the lower anterior abdominal wall. Musculoskeletal: No acute osseous findings or evidence of bone destruction. There are degenerative changes throughout the spine with ankylosis of the L1 through L3 vertebral bodies. There are moderately advanced degenerative changes at both hip status post proximal right femoral ORIF. IMPRESSION: 1. Persistent abnormal appearance of the urinary bladder status post trans urethral resection of the prostate gland. There is persistent irregular enhancing nodularity along the bladder base, suspicious for residual/recurrent bladder cancer. 2. Enlarging diverticular-like structure extending anteriorly from the bladder and herniating between the rectus abdominus muscles and the lower anterior abdominal wall. Again, this could be related to tumor and bladder outlet obstruction versus tissue necrosis/infection. 3. No evidence of distant metastatic disease. 4. Bilateral percutaneous nephrostomies remain in place. No hydronephrosis. 5. Aortic Atherosclerosis (ICD10-I70.0). Electronically Signed   By: Richardean Sale  M.D.   On: 09/09/2019 13:29   Dg Chest Portable 1 View  Result Date: 09/25/2019 CLINICAL DATA:  Sepsis, mental status decline. EXAM: PORTABLE CHEST 1 VIEW COMPARISON:  Chest CT 08/08/2019 FINDINGS: Power injectable right Port-A-Cath noted, tip projecting over the SVC. Mild bilateral interstitial accentuation in both lungs with some  hazy density in the infrahilar regions potentially from atypical pneumonia or noncardiogenic edema. No cardiomegaly. Atherosclerotic calcification of the aortic arch. No blunting of the costophrenic angles. IMPRESSION: 1. Hazy infrahilar airspace opacity in the lungs bilaterally, potentially from atypical pneumonia or noncardiogenic edema. 2. Atherosclerotic calcification of the aortic arch. 3. Power injectable right Port-A-Cath tip: SVC. Electronically Signed   By: Van Clines M.D.   On: 09/25/2019 20:19   Ir Nephrostomy Exchange Left  Result Date: 09/13/2019 CLINICAL DATA:  Bladder carcinoma. Long term indwelling bilateral nephrostomy catheters, presents for scheduled exchange. No current problems with drain catheters. EXAM: BILATERAL PERCUTANEOUS NEPHROSTOMY CATHETER EXCHANGE UNDER FLUOROSCOPY TECHNIQUE: The procedure, risks (including but not limited to bleeding, infection, organ damage ), benefits, and alternatives were explained to the patient and spouse. Questions regarding the procedure were encouraged and answered. The patient understands and consents to the procedure. The nephrostomy tubes and surrounding skin were prepped with Betadine, draped in usual sterile fashion. 1% lidocaine subcutaneous placed around both skin entry sites. A small amount of contrast was injected through the left nephrostomy catheter to opacify the renal collecting system. The catheter was cut and exchanged over a 0.035" angiographic wire for a new 12 pigtail catheter, formed centrally within the collecting system under fluoroscopy. Contrast injection confirms appropriate positioning. In  a similar fashion, the right nephrostomy catheter was injected, cut, and exchanged for a new 12 pigtail catheter, formed centrally within the right renal collecting system. Injection confirms appropriate positioning and patency. Both catheters were secured externally with a Statlock devices and 0 Prolene sutures. The patient tolerated the procedure well. FLUOROSCOPY TIME:  1.2 minute; 4 5 uGym2 DAP COMPLICATIONS: None immediate IMPRESSION: 1. Technically successful exchange of bilateral nephrostomy catheters under fluoroscopy Electronically Signed   By: Lucrezia Europe M.D.   On: 09/13/2019 09:50   Ir Nephrostomy Exchange Right  Result Date: 09/13/2019 CLINICAL DATA:  Bladder carcinoma. Long term indwelling bilateral nephrostomy catheters, presents for scheduled exchange. No current problems with drain catheters. EXAM: BILATERAL PERCUTANEOUS NEPHROSTOMY CATHETER EXCHANGE UNDER FLUOROSCOPY TECHNIQUE: The procedure, risks (including but not limited to bleeding, infection, organ damage ), benefits, and alternatives were explained to the patient and spouse. Questions regarding the procedure were encouraged and answered. The patient understands and consents to the procedure. The nephrostomy tubes and surrounding skin were prepped with Betadine, draped in usual sterile fashion. 1% lidocaine subcutaneous placed around both skin entry sites. A small amount of contrast was injected through the left nephrostomy catheter to opacify the renal collecting system. The catheter was cut and exchanged over a 0.035" angiographic wire for a new 12 pigtail catheter, formed centrally within the collecting system under fluoroscopy. Contrast injection confirms appropriate positioning. In a similar fashion, the right nephrostomy catheter was injected, cut, and exchanged for a new 12 pigtail catheter, formed centrally within the right renal collecting system. Injection confirms appropriate positioning and patency. Both catheters were secured  externally with a Statlock devices and 0 Prolene sutures. The patient tolerated the procedure well. FLUOROSCOPY TIME:  1.2 minute; 4 5 uGym2 DAP COMPLICATIONS: None immediate IMPRESSION: 1. Technically successful exchange of bilateral nephrostomy catheters under fluoroscopy Electronically Signed   By: Lucrezia Europe M.D.   On: 09/13/2019 09:50     Antimicrobials:   Vancomycin cefepime and Flagyl day two   Subjective: No acute events overnight was admitted secondary to infection of his abdominal wall  Objective: Vitals:   10/05/19 1830 10/05/19 2139 10/05/19 2140 10/06/19 0453  BP: 100/78  117/65 114/61  Pulse: (!) 107  99 (!) 104  Resp: 19  16 16   Temp:   97.9 F (36.6 C) 97.6 F (36.4 C)  TempSrc:   Oral Oral  SpO2: 99%  97% 97%  Weight:  66.6 kg    Height:  5\' 11"  (1.803 m)      Intake/Output Summary (Last 24 hours) at 10/06/2019 1109 Last data filed at 10/06/2019 1100 Gross per 24 hour  Intake 1200 ml  Output 1050 ml  Net 150 ml   Filed Weights   10/05/19 1418 10/05/19 2139  Weight: 65.8 kg 66.6 kg    Examination:  General exam: Appears calm and comfortable  Respiratory system: Clear to auscultation. Respiratory effort normal. Cardiovascular system: S1 & S2 heard, RRR. No JVD, murmurs, rubs, gallops or clicks. No pedal edema. Gastrointestinal system: Abdomen with colostomy bag no output no signs of infection, post I&D wound on abdominal wall no evidence of bleeding or exuding purulence at this point time Central nervous system: Alert,  Extremities: Warm well perfused  skin: Lesions abdominal wall as above otherwise no rashes, lesions or ulcers Psychiatry: Judgement and insight are poor. Mood & affect flat    Data Reviewed: I have personally reviewed following labs and imaging studies  CBC: Recent Labs  Lab 10/05/19 1427 10/06/19 0504  WBC 63.7* 51.9*  NEUTROABS 51.9*  --   HGB 11.9* 10.4*  HCT 36.4* 31.8*  MCV 85.0 86.4  PLT 293 564   Basic Metabolic  Panel: Recent Labs  Lab 10/05/19 1427 10/06/19 0504  NA 133* 137  K 3.8 3.4*  CL 100 106  CO2 21* 22  GLUCOSE 133* 118*  BUN 20 17  CREATININE 0.92 0.86  CALCIUM 8.5* 8.4*   GFR: Estimated Creatinine Clearance: 62.4 mL/min (by C-G formula based on SCr of 0.86 mg/dL). Liver Function Tests: Recent Labs  Lab 10/05/19 1427 10/06/19 0504  AST 13* 10*  ALT 9 7  ALKPHOS 130* 125  BILITOT 0.6 0.6  PROT 6.7 5.7*  ALBUMIN 2.9* 2.4*   No results for input(s): LIPASE, AMYLASE in the last 168 hours. No results for input(s): AMMONIA in the last 168 hours. Coagulation Profile: No results for input(s): INR, PROTIME in the last 168 hours. Cardiac Enzymes: No results for input(s): CKTOTAL, CKMB, CKMBINDEX, TROPONINI in the last 168 hours. BNP (last 3 results) No results for input(s): PROBNP in the last 8760 hours. HbA1C: No results for input(s): HGBA1C in the last 72 hours. CBG: No results for input(s): GLUCAP in the last 168 hours. Lipid Profile: No results for input(s): CHOL, HDL, LDLCALC, TRIG, CHOLHDL, LDLDIRECT in the last 72 hours. Thyroid Function Tests: No results for input(s): TSH, T4TOTAL, FREET4, T3FREE, THYROIDAB in the last 72 hours. Anemia Panel: No results for input(s): VITAMINB12, FOLATE, FERRITIN, TIBC, IRON, RETICCTPCT in the last 72 hours. Sepsis Labs: Recent Labs  Lab 10/05/19 1427 10/05/19 1619  LATICACIDVEN 2.7* 1.4    Recent Results (from the past 240 hour(s))  Blood Culture (routine x 2)     Status: None (Preliminary result)   Collection Time: 10/05/19  4:18 PM   Specimen: BLOOD  Result Value Ref Range Status   Specimen Description BLOOD BLOOD RIGHT WRIST  Final   Special Requests   Final    BOTTLES DRAWN AEROBIC AND ANAEROBIC Blood Culture adequate volume   Culture   Final    NO GROWTH < 24 HOURS Performed at [facility], [address], [place], [place] [address]  Report Status PENDING  Incomplete  Blood Culture (routine x 2)      Status: None (Preliminary result)   Collection Time: 10/05/19  4:18 PM   Specimen: BLOOD  Result Value Ref Range Status   Specimen Description BLOOD BLOOD LEFT WRIST  Final   Special Requests   Final    BOTTLES DRAWN AEROBIC AND ANAEROBIC Blood Culture results may not be optimal due to an inadequate volume of blood received in culture bottles   Culture   Final    NO GROWTH < 24 HOURS Performed at [facility], [address] [place], [place] [address]    Report Status PENDING  Incomplete  SARS CORONAVIRUS 2 (TAT 6-24 HRS) Nasopharyngeal Nasopharyngeal Swab     Status: None   Collection Time: 10/05/19  4:18 PM   Specimen: Nasopharyngeal Swab  Result Value Ref Range Status   SARS Coronavirus 2 NEGATIVE NEGATIVE Final    Comment: (NOTE) SARS-CoV-2 target nucleic acids are NOT DETECTED. The SARS-CoV-2 RNA is generally detectable in upper and lower respiratory specimens during the acute phase of infection. Negative results do not preclude SARS-CoV-2 infection, do not rule out co-infections with other pathogens, and should not be used as the sole basis for treatment or other patient management decisions. Negative results must be combined with clinical observations, patient history, and epidemiological information. The expected result is Negative. Fact Sheet for Patients: SugarRoll.be Fact Sheet for Healthcare Providers: https://www.woods-mathews.com/ This test is not yet approved or cleared by the [place] FDA and  has been authorized for detection and/or diagnosis of SARS-CoV-2 by FDA under an Emergency Use Authorization (EUA). This EUA will remain  in effect (meaning this test can be used) for the duration of the COVID-19 declaration under Section 56 4(b)(1) of the Act, 21 U.S.C. section 360bbb-3(b)(1), unless the authorization is terminated or revoked sooner. Performed at [facility], [place] [address] [place],  [place] [address]          Radiology Studies: Ct Abdomen Pelvis W Contrast  Result Date: 10/05/2019 CLINICAL DATA:  84 year old male with history of lower abdominal pain from an abscess. EXAM: CT ABDOMEN AND PELVIS WITH CONTRAST TECHNIQUE: Multidetector CT imaging of the abdomen and pelvis was performed using the standard protocol following bolus administration of intravenous contrast. CONTRAST:  149mL OMNIPAQUE IOHEXOL 300 MG/ML  SOLN COMPARISON:  CT the abdomen and pelvis 09/25/2019. FINDINGS: Lower chest: Small right pleural effusion lying dependently. Areas of subsegmental atelectasis in the lower lobes of the lungs bilaterally. Atherosclerotic calcifications in the descending thoracic aorta as well as the left anterior descending, left circumflex and right coronary arteries. Hepatobiliary: No suspicious cystic or solid hepatic lesions. No intra or extrahepatic biliary ductal dilatation. Status post cholecystectomy. Pancreas: No pancreatic mass. No pancreatic ductal dilatation. No pancreatic or peripancreatic fluid collections or inflammatory changes. Spleen: Unremarkable. Adrenals/Urinary Tract: Percutaneous nephrostomy tubes are noted with tips reformed in the renal collecting systems bilaterally. 2.8 cm low-attenuation lesion in the anterior aspect of the interpolar region of the right kidney, compatible with a simple cyst. Subcentimeter low-attenuation lesion in the anterior aspect of the lower pole the left kidney, too small to characterize, but statistically likely to represent a tiny cyst. No hydroureteronephrosis. Urinary bladder is enlarged measuring approximately 8.6 x 14.5 x 8.8 cm and grossly distorted with extensive multifocal mural thickening and widespread areas of internal soft tissue thickening and enhancing soft tissue, with a combination of fluid and gas within the bladder, concerning for progressively  enlarging bladder neoplasm. This appears to extend through the lower anterior abdominal  wall where there is a small gas and fluid collection in the subcutaneous fat immediately superficial to the anterior abdominal wall musculature measuring 8.0 x 1.7 x 5.0 cm (axial image 68 of series 2 and sagittal image 64 of series 6). Bilateral adrenal glands are normal in appearance. Stomach/Bowel: Normal appearance of the stomach. No pathologic dilatation of small bowel or colon. A few scattered colonic diverticulae are noted, without surrounding inflammatory changes to suggest an acute diverticulitis at this time. Mid transverse colon loop colostomy. Normal appendix. Vascular/Lymphatic: Aortic atherosclerosis, without evidence of aneurysm or dissection in the abdominal or pelvic vasculature. No lymphadenopathy noted in the abdomen or pelvis. Reproductive: Prostate gland is completely obscured by the adjacent bladder mass, likely from direct invasion. Seminal vesicles are not confidently identified. Other: No significant volume of ascites.  No pneumoperitoneum. Musculoskeletal: There are no aggressive appearing lytic or blastic lesions noted in the visualized portions of the skeleton. IMPRESSION: 1. Progressively enlarging heterogeneously enhancing bladder mass which now appears to have eroded through the inferior aspect of the anterior abdominal wall musculature, with small superficial abscess just deep to the skin surface in the low anatomic pelvis. 2. Bilateral nephrostomy tubes are stable in position. No hydroureteronephrosis. 3. Small right pleural effusion lying dependently. 4. Aortic atherosclerosis, in addition to least 3 vessel coronary artery disease. 5. Additional incidental findings, as above, similar to prior studies. Electronically Signed   By: Vinnie Langton M.D.   On: 10/05/2019 18:10        Scheduled Meds:  Chlorhexidine Gluconate Cloth  6 each Topical Daily   enoxaparin (LOVENOX) injection  40 mg Subcutaneous Q24H   mirabegron ER  50 mg Oral Daily   Continuous Infusions:   sodium chloride Stopped (10/05/19 1833)   ceFEPime (MAXIPIME) IV 2 g (10/06/19 1021)   dextrose 5 % and 0.45% NaCl 100 mL/hr at 10/06/19 1016   metronidazole 500 mg (10/06/19 1018)   vancomycin       LOS: 1 day    Time spent: 35 min    Nicolette Bang, MD [facility]  If 7PM-7AM, please contact night-coverage  10/06/2019, 11:09 AM

## 2019-10-06 NOTE — Progress Notes (Signed)
MD notified: I'm sure you have probably seem this patient labs but would you like to replace potassium since its 3.4 this morning and WBC is decreasing slowing it is  51.9 this morning.

## 2019-10-06 NOTE — Consult Note (Signed)
Pharmacy Antibiotic Note  Vincent Poole is a 84 y.o. male admitted on 10/05/2019 with pneumonia and intra-abdominal infection.  Pharmacy has been consulted for Vancomycin and Cefepime dosing. Patient suffering from drainage from abdomen. Has red "lump" on lower abdomen that opened today and began draining white bloody fluid.  Plan: 1) Patient will receive Vancomycin 1500mg  IV x 1 dose in the ED. Maintenance: Vancomycin 1500 mg IV Q 24 hrs. Goal AUC 400-550. Expected AUC: 535.5 Expected Css: 11.0 SCr used: 0.92  2) Crcl 62.4. will adjust Cefepime 2g IV Q12 hours to q8h.    Height: 5\' 11"  (180.3 cm) Weight: 146 lb 13.2 oz (66.6 kg) IBW/kg (Calculated) : 75.3  Temp (24hrs), Avg:98 F (36.7 C), Min:97.6 F (36.4 C), Max:98.6 F (37 C)  Recent Labs  Lab 10/05/19 1427 10/05/19 1619 10/06/19 0504  WBC 63.7*  --  51.9*  CREATININE 0.92  --  0.86  LATICACIDVEN 2.7* 1.4  --     Estimated Creatinine Clearance: 62.4 mL/min (by C-G formula based on SCr of 0.86 mg/dL).    No Known Allergies  Antimicrobials this admission: Vancomycin 11/28 >>  Cefepime 11/28 >>  Metronidazole 11/28 >> Rocephin 11/28 x1   Microbiology results: 11/28 BCx: pending 11/28 UCx: pending   Thank you for allowing pharmacy to be a part of this patient's care.  Chalee Hirota A 10/06/2019 9:44 AM

## 2019-10-07 ENCOUNTER — Ambulatory Visit: Payer: Medicare Other

## 2019-10-07 LAB — CBC WITH DIFFERENTIAL/PLATELET
Abs Immature Granulocytes: 2.2 10*3/uL — ABNORMAL HIGH (ref 0.00–0.07)
Basophils Absolute: 0.4 10*3/uL — ABNORMAL HIGH (ref 0.0–0.1)
Basophils Relative: 1 %
Eosinophils Absolute: 0.8 10*3/uL — ABNORMAL HIGH (ref 0.0–0.5)
Eosinophils Relative: 2 %
HCT: 29.6 % — ABNORMAL LOW (ref 39.0–52.0)
Hemoglobin: 9.6 g/dL — ABNORMAL LOW (ref 13.0–17.0)
Immature Granulocytes: 4 %
Lymphocytes Relative: 6 %
Lymphs Abs: 3.1 10*3/uL (ref 0.7–4.0)
MCH: 28.3 pg (ref 26.0–34.0)
MCHC: 32.4 g/dL (ref 30.0–36.0)
MCV: 87.3 fL (ref 80.0–100.0)
Monocytes Absolute: 2.1 10*3/uL — ABNORMAL HIGH (ref 0.1–1.0)
Monocytes Relative: 4 %
Neutro Abs: 41.1 10*3/uL — ABNORMAL HIGH (ref 1.7–7.7)
Neutrophils Relative %: 83 %
Platelets: 358 10*3/uL (ref 150–400)
RBC: 3.39 MIL/uL — ABNORMAL LOW (ref 4.22–5.81)
RDW: 18.5 % — ABNORMAL HIGH (ref 11.5–15.5)
Smear Review: NORMAL
WBC: 49.5 10*3/uL — ABNORMAL HIGH (ref 4.0–10.5)
nRBC: 0 % (ref 0.0–0.2)

## 2019-10-07 LAB — BASIC METABOLIC PANEL
Anion gap: 11 (ref 5–15)
BUN: 13 mg/dL (ref 8–23)
CO2: 20 mmol/L — ABNORMAL LOW (ref 22–32)
Calcium: 8.2 mg/dL — ABNORMAL LOW (ref 8.9–10.3)
Chloride: 108 mmol/L (ref 98–111)
Creatinine, Ser: 0.88 mg/dL (ref 0.61–1.24)
GFR calc Af Amer: >60 mL/min (ref >60)
GFR calc non Af Amer: >60 mL/min (ref >60)
Glucose, Bld: 100 mg/dL — ABNORMAL HIGH (ref 70–99)
Potassium: 3 mmol/L — ABNORMAL LOW (ref 3.5–5.1)
Sodium: 139 mmol/L (ref 135–145)

## 2019-10-07 LAB — URINE CULTURE

## 2019-10-07 MED ORDER — POTASSIUM CHLORIDE 10 MEQ/100ML IV SOLN
10.0000 meq | INTRAVENOUS | Status: AC
  Administered 2019-10-07 (×4): 10 meq via INTRAVENOUS
  Filled 2019-10-07 (×4): qty 100

## 2019-10-07 NOTE — Progress Notes (Signed)
MD notified: Sorry to bother you. Do you know the plan for the patient. Radiation oncolology has called me that they will be calling for the patient at 10am. I told them that Surgery service has him NPO for possible procedure. Do you know the plan or if they were going to touch base with Dr. Erlene Quan (Urology) today or will he go to get chemo?

## 2019-10-07 NOTE — Progress Notes (Signed)
Subjective:  CC: Vincent Poole is a 84 y.o. male  Hospital stay day 2,   Abdominal wall abscess  HPI: No acute issues overnight.  ROS:  General: Denies weight loss, weight gain, fatigue, fevers, chills, and night sweats. Heart: Denies chest pain, palpitations, racing heart, irregular heartbeat, leg pain or swelling, and decreased activity tolerance. Respiratory: Denies breathing difficulty, shortness of breath, wheezing, cough, and sputum. GI: Denies change in appetite, heartburn, nausea, vomiting, constipation, diarrhea, and blood in stool.    Objective:   Temp:  [97.5 F (36.4 C)-97.6 F (36.4 C)] 97.6 F (36.4 C) (11/30 1229) Pulse Rate:  [97-103] 97 (11/30 1229) Resp:  [16-18] 18 (11/30 1229) BP: (103-110)/(54-58) 109/57 (11/30 1229) SpO2:  [95 %-97 %] 96 % (11/30 1229)     Height: 5\' 11"  (180.3 cm) Weight: 66.6 kg BMI (Calculated): 20.49   Intake/Output this shift:   Intake/Output Summary (Last 24 hours) at 10/07/2019 1323 Last data filed at 10/07/2019 2620 Gross per 24 hour  Intake 1175.84 ml  Output 1535 ml  Net -359.16 ml    Constitutional :  alert, cooperative, appears stated age and no distress  Respiratory:  clear to auscultation bilaterally  Cardiovascular:  regular rate and rhythm  Gastrointestinal: Soft, no guarding, focal induration and tenderness palpation around the open abscess site in the suprapubic region.  Still draining copious amounts of purulent material..   Skin: Cool and moist.   Psychiatric: Normal affect, non-agitated, not confused       LABS:  CMP Latest Ref Rng & Units 10/07/2019 10/06/2019 10/05/2019  Glucose 70 - 99 mg/dL 100(H) 118(H) 133(H)  BUN 8 - 23 mg/dL 13 17 20   Creatinine 0.61 - 1.24 mg/dL 0.88 0.86 0.92  Sodium 135 - 145 mmol/L 139 137 133(L)  Potassium 3.5 - 5.1 mmol/L 3.0(L) 3.4(L) 3.8  Chloride 98 - 111 mmol/L 108 106 100  CO2 22 - 32 mmol/L 20(L) 22 21(L)  Calcium 8.9 - 10.3 mg/dL 8.2(L) 8.4(L) 8.5(L)  Total  Protein 6.5 - 8.1 g/dL - 5.7(L) 6.7  Total Bilirubin 0.3 - 1.2 mg/dL - 0.6 0.6  Alkaline Phos 38 - 126 U/L - 125 130(H)  AST 15 - 41 U/L - 10(L) 13(L)  ALT 0 - 44 U/L - 7 9   CBC Latest Ref Rng & Units 10/07/2019 10/06/2019 10/05/2019  WBC 4.0 - 10.5 K/uL 49.5(H) 51.9(HH) 63.7(HH)  Hemoglobin 13.0 - 17.0 g/dL 9.6(L) 10.4(L) 11.9(L)  Hematocrit 39.0 - 52.0 % 29.6(L) 31.8(L) 36.4(L)  Platelets 150 - 400 K/uL 358 329 293    RADS: n/a Assessment:   Abdominal wall abscess secondary to fungating bladder cancer invading the abdominal wall.  Purulent drainage was drained as much as possible with gentle pressure.  After no further obvious drainage was noted, and a ostomy dressing was placed over the open midline wound in order to further facilitate adequate drainage and negate the need for frequent dressing changes.  His extensive discussion was held between oncologist and urologist regarding his current status.  Group consensus was that this bladder tumor is inoperable at this time and the surgical intervention will provide very little benefit to his overall prognosis.  Therefore, he was made to forego any surgical intervention and to focus on localized wound care with the ostomy pouch at this time.  General surgery will sign off.  Please call with any additional questions or concerns.

## 2019-10-07 NOTE — Consult Note (Addendum)
WOC consult requested for colostomy.  Pt had colostomy surgery performed in August and is familiar to the [facility] nursing team.  Wife at the bedside states she performed pouch changes and emptying  For the patient prior to admission.  She has her own supplies at the bedside and is using a one Building surveyor pouch and barrier ring.  Pouch was applied yesterday and is intact with a good seal.  Scant amt dark brown stool, unable to visualize stoma.  Lower right abd with leaking abscess; urostomy pouch has been applied  To attempt to contain drainage and is intact with a good seal. Red raised "psudostoma" visualized through the pouch, small amt thick pink drainage.  Supplies at the bedside for staff nurse or patient's wife use.  She denies further questions or need for assistance.  Please re-consult if further assistance is needed.  Thank-you,  Julien Girt MSN, [place], [place], [place], [place]

## 2019-10-07 NOTE — Consult Note (Signed)
Subjective:   CC: Domino wall abscess  HPI:  Vincent Poole is a 84 y.o. male who was consulted by [name] for evaluation of above.  First noted several days ago.  Symptoms include: Pain is sharp, localized to the suprapubic region, where he they have noticed increased purulent drainage..  Exacerbated by pressure.  Alleviated by nothing specific.  Associated with nothing specific  History of metastatic bladder cancer status post bilateral nephrostomy tube placement, and diverting transverse colostomy..     Past Medical History:  has a past medical history of Anxiety, Arthritis, Bladder cancer (Vincent Poole), Dysrhythmia (07/2018), Femur fracture, right (Vincent Poole) (05/01/2018), GERD (gastroesophageal reflux disease), History of recent blood transfusion (05/2018), Hypertension, Iron deficiency anemia (09/14/2018), Prostate cancer (Vincent Poole) (88/8280), Umbilical hernia (01/4916), Urinary retention (2019), UTI (urinary tract infection) (2019), and Wound eschar of foot (07/2018).  Past Surgical History:  has a past surgical history that includes Carpal tunnel release (Right); Leg Tendon Surgery (Right, 1958); Cystoscopy w/ ureteral stent placement (Bilateral, 12/27/2017); Transurethral resection of bladder tumor (N/A, 12/27/2017); Transurethral resection of bladder tumor (N/A, 01/15/2018); PORTA CATH INSERTION (N/A, 01/22/2018); Intramedullary (im) nail intertrochanteric (Right, 05/02/2018); Cholecystectomy (2004); Cystoscopy w/ ureteral stent placement (Bilateral, 07/18/2018); Cystoscopy w/ retrogrades (Bilateral, 07/18/2018); Transurethral resection of bladder tumor (N/A, 07/18/2018); Cystogram (07/18/2018); Cystoscopy with stent placement (Bilateral, 11/12/2018); IR NEPHROSTOMY PLACEMENT LEFT (02/23/2019); IR NEPHROSTOMY PLACEMENT RIGHT (02/23/2019); IR NEPHROSTOMY EXCHANGE LEFT (03/25/2019); IR NEPHROSTOMY EXCHANGE RIGHT (03/25/2019); IR NEPHROSTOMY EXCHANGE LEFT (04/19/2019); IR NEPHROSTOMY EXCHANGE RIGHT (04/19/2019); IR NEPHROSTOMY  EXCHANGE LEFT (05/31/2019); IR NEPHROSTOMY EXCHANGE RIGHT (05/31/2019); laparotomy (N/A, 06/08/2019); Colostomy (N/A, 06/08/2019); IR NEPHROSTOMY EXCHANGE LEFT (07/19/2019); IR NEPHROSTOMY EXCHANGE RIGHT (07/19/2019); IR NEPHROSTOMY EXCHANGE LEFT (09/13/2019); and IR NEPHROSTOMY EXCHANGE RIGHT (09/13/2019).  Family History: family history includes Cancer in his mother; Chronic Renal Failure in his mother; Heart disease in his father.  Social History:  reports that he has never smoked. He has never used smokeless tobacco. He reports that he does not drink alcohol or use drugs.  Current Medications:  Medications Prior to Admission  Medication Sig Dispense Refill  . acetaminophen (TYLENOL) 500 MG tablet Take 1,000 mg by mouth every 6 (six) hours as needed for moderate pain or headache.     . cetirizine (ZYRTEC) 10 MG tablet Take 10 mg by mouth daily as needed for allergies.     . Cranberry-Cholecalciferol (SUPER CRANBERRY/VITAMIN D3) 4200-500 MG-UNIT CAPS Take 1 capsule by mouth 2 (two) times daily.    Marland Kitchen dexamethasone (DECADRON) 4 MG tablet Take 2 tablets (8 mg total) by mouth daily. Start the day after chemotherapy for 2 days. 30 tablet 1  . feeding supplement, ENSURE ENLIVE, (ENSURE ENLIVE) LIQD Take 237 mLs by mouth 2 (two) times daily between meals. 237 mL 12  . ferrous sulfate 325 (65 FE) MG tablet Take 1 tablet (325 mg total) by mouth 2 (two) times daily with a meal.  3  . Glycerin-Hypromellose-PEG 400 (DRY EYE RELIEF DROPS OP) Apply 1 drop to eye daily as needed.     . lidocaine-prilocaine (EMLA) cream Apply 1 application topically as needed. 30 g 1  . Multiple Vitamin (MULTIVITAMIN WITH MINERALS) TABS tablet Take 1 tablet by mouth daily.    Marland Kitchen MYRBETRIQ 50 MG TB24 tablet TAKE 1 TABLET BY MOUTH DAILY (Patient taking differently: Take 50 mg by mouth daily. ) 30 tablet 3  . nystatin (NYAMYC) powder Apply topically 2 (two) times daily. (Patient taking differently: Apply 1 g topically 2 (two) times daily as  needed. )  60 g 3  . ondansetron (ZOFRAN) 8 MG tablet Take 1 tablet (8 mg total) by mouth 2 (two) times daily as needed for refractory nausea / vomiting. Start on day 3 after chemo. 30 tablet 1  . oxyCODONE (OXY IR/ROXICODONE) 5 MG immediate release tablet Take 1 tablet (5 mg total) by mouth every 4 (four) hours as needed for severe pain. 60 tablet 0  . polyethylene glycol powder (MIRALAX) powder Take 17 g by mouth daily as needed. Can increase to 3 times a day as needed for constipation but hold medication if has diarrhea 255 g 0  . prochlorperazine (COMPAZINE) 10 MG tablet Take 1 tablet (10 mg total) by mouth every 6 (six) hours as needed (Nausea or vomiting). 30 tablet 1  . vitamin B-12 (CYANOCOBALAMIN) 500 MCG tablet Take 500 mcg by mouth daily.    Marland Kitchen zinc oxide 20 % ointment Apply 1 application topically as needed for irritation.      Allergies:  No Known Allergies  ROS:  General: Denies weight loss, weight gain, fatigue, fevers, chills, and night sweats. Eyes: Denies blurry vision, double vision, eye pain, itchy eyes, and tearing. Ears: Denies hearing loss, earache, and ringing in ears. Nose: Denies sinus pain, congestion, infections, runny nose, and nosebleeds. Mouth/throat: Denies hoarseness, sore throat, bleeding gums, and difficulty swallowing. Heart: Denies chest pain, palpitations, racing heart, irregular heartbeat, leg pain or swelling, and decreased activity tolerance. Respiratory: Denies breathing difficulty, shortness of breath, wheezing, cough, and sputum. GI: Denies change in appetite, heartburn, nausea, vomiting, constipation, diarrhea, and blood in stool. Musculoskeletal: Denies joint stiffness, pain, swelling, muscle weakness. Skin: Denies rash, itching, mass, tumors, sores, and boils Neurologic: Denies headache, fainting, dizziness, seizures, numbness, and tingling. Psychiatric: Denies depression, anxiety, difficulty sleeping, and memory loss. Endocrine: Denies heat or  cold intolerance, and increased thirst or urination. Blood/lymph: Denies easy bruising, easy bruising, and swollen glands     Objective:     BP (!) 109/57   Pulse 97   Temp 97.6 F (36.4 C) (Oral)   Resp 18   Ht 5\' 11"  (1.803 m)   Wt 66.6 kg   SpO2 96%   BMI 20.48 kg/m   Constitutional :  alert, cooperative, appears stated age and no distress  Lymphatics/Throat:  no asymmetry, masses, or scars  Respiratory:  clear to auscultation bilaterally  Cardiovascular:  regular rate and rhythm, S1, S2 normal, no murmur, click, rub or gallop  Gastrointestinal: Soft, no guarding, area of induration and erythema in suprapubic region with small opening in the midline with obvious purulent drainage.  Tenderness to palpation surrounding the area.  Bilateral nephrostomy tubes in place.  Transverse diverting colostomy supplies intact.  Musculoskeletal: Steady gait and movement  Skin: Cool and moist,   Psychiatric: Normal affect, non-agitated, not confused       LABS:  CMP Latest Ref Rng & Units 10/07/2019 10/06/2019 10/05/2019  Glucose 70 - 99 mg/dL 100(H) 118(H) 133(H)  BUN 8 - 23 mg/dL 13 17 20   Creatinine 0.61 - 1.24 mg/dL 0.88 0.86 0.92  Sodium 135 - 145 mmol/L 139 137 133(L)  Potassium 3.5 - 5.1 mmol/L 3.0(L) 3.4(L) 3.8  Chloride 98 - 111 mmol/L 108 106 100  CO2 22 - 32 mmol/L 20(L) 22 21(L)  Calcium 8.9 - 10.3 mg/dL 8.2(L) 8.4(L) 8.5(L)  Total Protein 6.5 - 8.1 g/dL - 5.7(L) 6.7  Total Bilirubin 0.3 - 1.2 mg/dL - 0.6 0.6  Alkaline Phos 38 - 126 U/L - 125 130(H)  AST  15 - 41 U/L - 10(L) 13(L)  ALT 0 - 44 U/L - 7 9   CBC Latest Ref Rng & Units 10/07/2019 10/06/2019 10/05/2019  WBC 4.0 - 10.5 K/uL 49.5(H) 51.9(HH) 63.7(HH)  Hemoglobin 13.0 - 17.0 g/dL 9.6(L) 10.4(L) 11.9(L)  Hematocrit 39.0 - 52.0 % 29.6(L) 31.8(L) 36.4(L)  Platelets 150 - 400 K/uL 358 329 293    RADS: CLINICAL DATA:  84 year old male with history of lower abdominal pain from an abscess.  EXAM: CT ABDOMEN  AND PELVIS WITH CONTRAST  TECHNIQUE: Multidetector CT imaging of the abdomen and pelvis was performed using the standard protocol following bolus administration of intravenous contrast.  CONTRAST:  152mL OMNIPAQUE IOHEXOL 300 MG/ML  SOLN  COMPARISON:  CT the abdomen and pelvis 09/25/2019.  FINDINGS: Lower chest: Small right pleural effusion lying dependently. Areas of subsegmental atelectasis in the lower lobes of the lungs bilaterally. Atherosclerotic calcifications in the descending thoracic aorta as well as the left anterior descending, left circumflex and right coronary arteries.  Hepatobiliary: No suspicious cystic or solid hepatic lesions. No intra or extrahepatic biliary ductal dilatation. Status post cholecystectomy.  Pancreas: No pancreatic mass. No pancreatic ductal dilatation. No pancreatic or peripancreatic fluid collections or inflammatory changes.  Spleen: Unremarkable.  Adrenals/Urinary Tract: Percutaneous nephrostomy tubes are noted with tips reformed in the renal collecting systems bilaterally. 2.8 cm low-attenuation lesion in the anterior aspect of the interpolar region of the right kidney, compatible with a simple cyst. Subcentimeter low-attenuation lesion in the anterior aspect of the lower pole the left kidney, too small to characterize, but statistically likely to represent a tiny cyst. No hydroureteronephrosis. Urinary bladder is enlarged measuring approximately 8.6 x 14.5 x 8.8 cm and grossly distorted with extensive multifocal mural thickening and widespread areas of internal soft tissue thickening and enhancing soft tissue, with a combination of fluid and gas within the bladder, concerning for progressively enlarging bladder neoplasm. This appears to extend through the lower anterior abdominal wall where there is a small gas and fluid collection in the subcutaneous fat immediately superficial to the anterior abdominal wall musculature  measuring 8.0 x 1.7 x 5.0 cm (axial image 68 of series 2 and sagittal image 64 of series 6). Bilateral adrenal glands are normal in appearance.  Stomach/Bowel: Normal appearance of the stomach. No pathologic dilatation of small bowel or colon. A few scattered colonic diverticulae are noted, without surrounding inflammatory changes to suggest an acute diverticulitis at this time. Mid transverse colon loop colostomy. Normal appendix.  Vascular/Lymphatic: Aortic atherosclerosis, without evidence of aneurysm or dissection in the abdominal or pelvic vasculature. No lymphadenopathy noted in the abdomen or pelvis.  Reproductive: Prostate gland is completely obscured by the adjacent bladder mass, likely from direct invasion. Seminal vesicles are not confidently identified.  Other: No significant volume of ascites.  No pneumoperitoneum.  Musculoskeletal: There are no aggressive appearing lytic or blastic lesions noted in the visualized portions of the skeleton.  IMPRESSION: 1. Progressively enlarging heterogeneously enhancing bladder mass which now appears to have eroded through the inferior aspect of the anterior abdominal wall musculature, with small superficial abscess just deep to the skin surface in the low anatomic pelvis. 2. Bilateral nephrostomy tubes are stable in position. No hydroureteronephrosis. 3. Small right pleural effusion lying dependently. 4. Aortic atherosclerosis, in addition to least 3 vessel coronary artery disease. 5. Additional incidental findings, as above, similar to prior studies.   Electronically Signed   By: Vinnie Langton M.D.   On: 10/05/2019 18:10  Assessment:  Abdominal wall abscess secondary to lesion of bladder cancer tumor into the abdominal wall.  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  Plan:     1.  Will discuss case with urologist in regards to if there is any surgical  options.  My initial thought is that due to the extensive nature of the tumor burden, local debridement at this point will provide little if no benefit to the overall prognosis for patient comfort.  Wound is adequately draining spontaneously at this point.  Continue local wound care and supportive care for now per primary team.

## 2019-10-07 NOTE — Progress Notes (Signed)
PROGRESS NOTE    Vincent Poole  PFY:[id number] DOB: 01/30/1935 DOA: 10/05/2019 PCP: [name], MD   Brief Narrative:  [name] a 84 y.o.malewith medical history significant forlocally-metastatic bladder cancer on chemotherapy, urinary tract obstruction with b/l nephrostomy tubes in place, recurrent UTI, htn, colostomy in place, provoked dvt s/p anticoagulation, who presents with a red "lump" on his lower abdomen in the suprapubic region for several months.  This was noted at recent hospitalization, thought to be 2/2 metastatic bladder cancer. Patient reports that since discharge has had occasional fever,  Lives at home with wife. Mostly bed-bound.  ED Course:manual expression of, per ED provider, approximately 30 cc pus from suprapubic wound. Keflex and metronidazole given. Labs and CT abdomen/pelvis ordered.  11/30: Patient's been seen by general surgery, case discussed with urology, recommending against aggressive invasive procedures as this is secondary to significant bladder cancer.  Dr. [name] also on board indicates patient and family has been resistant to hospice despite dismal prognosis   Assessment & Plan:   Principal Problem:   Intra-abdominal infection Active Problems:   Sepsis ([place])   Essential hypertension   Prostate cancer ([place])   Urinary obstruction   Malignant neoplasm of urinary bladder ([facility])   Pressure injury of skin   S/P partial resection of colon   Abscess with sepsis - secondary to locally metastatic bladder cancer concerning for erosion through the anterior abdominal wall.  -Continue with broad-spectrum antibiotics including Vanco cefepime and metronidazole an additional day  -General surgery saw the patient in consultation, discussed case with urology, because of significant tumor burden with cutaneous fistula recommend against aggressive surgical intervention. -Continue local wound care, antibiotics as above -Blood cultures  negative to date and urine cultures suggest recollection   Metastatic bladder cancer - on padcev, last dose earlier this month per wife, next dose due next month.  -Plan to start radiation therapy on 11/30 but this was canceled secondary to infection -Oncology aware, patient and family resistant to hospice, dismal prognosis  Acute on chronic leukocytosis  -Reviewed medical history patient appears to be have a chronic leukocytosis -Of note it was normal approximately 1 year ago, patient is being followed by oncology -Likely worse because of superimposed infection -We will follow-up cultures as above continue antibiotics  bilateral nephrostomuy tubes - 2/2 hx bladder obstruction - ostomy care -cont home myrbetric  colostomy - outpt per wife and patient is normal - ostomy care  stage 1 sacral decub POA - wound care  DVT prophylaxis: SCD/Compression stockings  Code Status: DNR    Code Status Orders  (From admission, onward)         Start     Ordered   10/05/19 1925  Do not attempt resuscitation (DNR)  Continuous    Question Answer Comment  In the event of cardiac or respiratory ARREST Do not call a code blue   In the event of cardiac or respiratory ARREST Do not perform Intubation, CPR, defibrillation or ACLS   In the event of cardiac or respiratory ARREST Use medication by any route, position, wound care, and other measures to relive pain and suffering. May use oxygen, suction and manual treatment of airway obstruction as needed for comfort.      10/05/19 1924        Code Status History    Date Active Date Inactive Code Status Order ID Comments User Context   09/25/2019 2326 09/28/2019 1718 DNR [id number]  Rise Patience, MD ED   09/25/2019  2206 09/25/2019 2326 DNR [id number]  Merlyn Lot, MD ED   06/07/2019 1651 06/12/2019 1937 DNR [id number]  Basilio Cairo, NP Inpatient   06/07/2019 0103 06/07/2019 1651 Full Code [id number]  Mayer Camel, NP Inpatient    04/26/2019 0420 04/29/2019 1913 Full Code [id number]  Mansy, Arvella Merles, MD ED   04/08/2019 1211 04/10/2019 1628 Full Code [id number]  Gladstone Lighter, MD ED   03/22/2019 2337 03/26/2019 1934 Full Code [id number]  Lance Coon, MD Inpatient   02/21/2019 1505 02/26/2019 1849 Full Code [id number]  Mayo, Pete Pelt, MD Inpatient   11/12/2018 1849 11/16/2018 1632 Full Code [id number]  Epifanio Lesches, MD Inpatient   05/01/2018 1702 05/06/2018 1711 Full Code [id number]  Dustin Flock, MD Inpatient   12/26/2017 2141 12/30/2017 1727 Full Code [id number]  Demetrios Loll, MD Inpatient   05/31/2017 0208 06/01/2017 1922 Full Code [id number]  Hugelmeyer, [place], DO Inpatient   03/04/2017 1833 03/05/2017 1426 Full Code [id number]  Idelle Crouch, MD Inpatient   07/11/2016 0538 07/13/2016 1420 Full Code [id number]  Saundra Shelling, MD ED   Advance Care Planning Activity     Family Communication: Discussed with wife 11/30 answered all questions explained the very poor prognosis and no surgery at this point Disposition Plan:   Patient remained in the hospital for IV antibiotics. Follow-up cultures, expert subspecialty consultation . Patient not medically ready or stable for discharge Consults called: None Admission status: Inpatient   Consultants:   GEN SURGERY  Procedures:  Ct Head Wo Contrast  Result Date: 09/25/2019 CLINICAL DATA:  Seizure EXAM: CT HEAD WITHOUT CONTRAST TECHNIQUE: Contiguous axial images were obtained from the base of the skull through the vertex without intravenous contrast. COMPARISON:  None. FINDINGS: Brain: There is atrophy and chronic small vessel disease changes. No acute intracranial abnormality. Specifically, no hemorrhage, hydrocephalus, mass lesion, acute infarction, or significant intracranial injury. Vascular: No hyperdense vessel or unexpected calcification. Skull: No acute calvarial abnormality. Sinuses/Orbits: Visualized paranasal sinuses and mastoids clear. Orbital soft tissues unremarkable. Other:  None IMPRESSION: Atrophy, chronic microvascular disease. No acute intracranial abnormality. Electronically Signed   By: Rolm Baptise M.D.   On: 09/25/2019 20:42   Ct Abdomen Pelvis W Contrast  Result Date: 10/05/2019 CLINICAL DATA:  84 year old male with history of lower abdominal pain from an abscess. EXAM: CT ABDOMEN AND PELVIS WITH CONTRAST TECHNIQUE: Multidetector CT imaging of the abdomen and pelvis was performed using the standard protocol following bolus administration of intravenous contrast. CONTRAST:  177mL OMNIPAQUE IOHEXOL 300 MG/ML  SOLN COMPARISON:  CT the abdomen and pelvis 09/25/2019. FINDINGS: Lower chest: Small right pleural effusion lying dependently. Areas of subsegmental atelectasis in the lower lobes of the lungs bilaterally. Atherosclerotic calcifications in the descending thoracic aorta as well as the left anterior descending, left circumflex and right coronary arteries. Hepatobiliary: No suspicious cystic or solid hepatic lesions. No intra or extrahepatic biliary ductal dilatation. Status post cholecystectomy. Pancreas: No pancreatic mass. No pancreatic ductal dilatation. No pancreatic or peripancreatic fluid collections or inflammatory changes. Spleen: Unremarkable. Adrenals/Urinary Tract: Percutaneous nephrostomy tubes are noted with tips reformed in the renal collecting systems bilaterally. 2.8 cm low-attenuation lesion in the anterior aspect of the interpolar region of the right kidney, compatible with a simple cyst. Subcentimeter low-attenuation lesion in the anterior aspect of the lower pole the left kidney, too small to characterize, but statistically likely to represent a tiny cyst. No hydroureteronephrosis. Urinary bladder is enlarged measuring approximately 8.6 x 14.5 x 8.8 cm and grossly distorted with extensive multifocal  mural thickening and widespread areas of internal soft tissue thickening and enhancing soft tissue, with a combination of fluid and gas within the bladder,  concerning for progressively enlarging bladder neoplasm. This appears to extend through the lower anterior abdominal wall where there is a small gas and fluid collection in the subcutaneous fat immediately superficial to the anterior abdominal wall musculature measuring 8.0 x 1.7 x 5.0 cm (axial image 68 of series 2 and sagittal image 64 of series 6). Bilateral adrenal glands are normal in appearance. Stomach/Bowel: Normal appearance of the stomach. No pathologic dilatation of small bowel or colon. A few scattered colonic diverticulae are noted, without surrounding inflammatory changes to suggest an acute diverticulitis at this time. Mid transverse colon loop colostomy. Normal appendix. Vascular/Lymphatic: Aortic atherosclerosis, without evidence of aneurysm or dissection in the abdominal or pelvic vasculature. No lymphadenopathy noted in the abdomen or pelvis. Reproductive: Prostate gland is completely obscured by the adjacent bladder mass, likely from direct invasion. Seminal vesicles are not confidently identified. Other: No significant volume of ascites.  No pneumoperitoneum. Musculoskeletal: There are no aggressive appearing lytic or blastic lesions noted in the visualized portions of the skeleton. IMPRESSION: 1. Progressively enlarging heterogeneously enhancing bladder mass which now appears to have eroded through the inferior aspect of the anterior abdominal wall musculature, with small superficial abscess just deep to the skin surface in the low anatomic pelvis. 2. Bilateral nephrostomy tubes are stable in position. No hydroureteronephrosis. 3. Small right pleural effusion lying dependently. 4. Aortic atherosclerosis, in addition to least 3 vessel coronary artery disease. 5. Additional incidental findings, as above, similar to prior studies. Electronically Signed   By: Vinnie Langton M.D.   On: 10/05/2019 18:10   Ct Abdomen Pelvis W Contrast  Result Date: 09/25/2019 CLINICAL DATA:  Acute abdominal  pain. Fever. EXAM: CT ABDOMEN AND PELVIS WITH CONTRAST TECHNIQUE: Multidetector CT imaging of the abdomen and pelvis was performed using the standard protocol following bolus administration of intravenous contrast. CONTRAST:  118mL OMNIPAQUE IOHEXOL 300 MG/ML  SOLN COMPARISON:  09/09/2019 FINDINGS: Lower chest: Aortic atherosclerosis. Extensive coronary artery calcifications. Heart size is normal. No pericardial effusion. Tiny bilateral pleural effusions with minimal bibasilar atelectasis. Hepatobiliary: No focal liver abnormality is seen. Status post cholecystectomy. No biliary dilatation. Pancreas: Unremarkable. No pancreatic ductal dilatation or surrounding inflammatory changes. Spleen: Normal in size without focal abnormality. Adrenals/Urinary Tract: Adrenal glands are normal. Bilateral nephrostomy tubes in place. Slight dilatation of the right renal pelvis and proximal right ureter. 21 mm simple cyst on the mid right kidney. Again noted is a large irregular inhomogeneous mass in the bladder which appears larger than on the prior study. There are irregular fluid collections containing gas in that region consistent with infection. Gas collection protrudes through the midline of the anterior abdominal wall as demonstrated on the prior exam. Stomach/Bowel: Large amount of stool in the rectum consistent with a fecal impaction. Colostomy in the mid transverse colon. Appendix is normal. No significant change since the prior study. Vascular/Lymphatic: Aortic atherosclerosis. No enlarged abdominal or pelvic lymph nodes. Reproductive: Prostate gland is not identified. Other: No ascites. Musculoskeletal: No acute abnormality. Severe arthritic changes of both hips, right greater than left. IMPRESSION: 1. Interval enlargement of the large irregular inhomogeneous mass in the bladder consistent with the patient's known bladder carcinoma. 2. Large amount of stool in the rectum consistent with fecal impaction, unchanged. 3.  Bilateral nephrostomy tubes in place. Slight dilatation of the right renal pelvis and proximal right ureter. 4. Tiny  bilateral pleural effusions with minimal bibasilar atelectasis. 5. Severe arthritic changes of both hips, right greater than left. 6. Aortic Atherosclerosis (ICD10-I70.0). Electronically Signed   By: Lorriane Shire M.D.   On: 09/25/2019 20:47   Ct Abdomen Pelvis W Contrast  Result Date: 09/09/2019 CLINICAL DATA:  Restaging prostate cancer and urothelial carcinoma diagnosed 1 year ago. History of bilateral nephrostomies, partial colon resection and cholecystectomy. Reported area of erythema and swelling in the abdominal wall. EXAM: CT ABDOMEN AND PELVIS WITH CONTRAST TECHNIQUE: Multidetector CT imaging of the abdomen and pelvis was performed using the standard protocol following bolus administration of intravenous contrast. CONTRAST:  119mL OMNIPAQUE IOHEXOL 300 MG/ML  SOLN COMPARISON:  CT 08/08/2019 and 06/06/2019. FINDINGS: Lower chest: Stable mild emphysematous changes and scarring at both lung bases. The small right pleural effusion noted previously has resolved. Hepatobiliary: The liver is normal in density without suspicious focal abnormality. No significant biliary dilatation post cholecystectomy. Pancreas: Unremarkable. No pancreatic ductal dilatation or surrounding inflammatory changes. Spleen: Normal in size without focal abnormality. Adrenals/Urinary Tract: The adrenal glands appear stable without suspicious findings. Bilateral percutaneous nephrostomies remain in place. There is bilateral renal cortical thinning with contrast excretion into the renal collecting systems on the delayed images. There is a stable cyst anteriorly in the mid right kidney. No hydronephrosis. Grossly abnormal urinary bladder again noted with irregular bladder wall thickening and enhancement. There are solid enhancing components measuring 4.5 x 2.9 cm on image 69/2 and 4.5 x 3.0 cm on image 72/2. There are  internal air-fluid levels within the bladder lumen with an enlarging diverticular like structure herniating between the rectus abdominus muscles. This now measures up to 7.7 x 7.0 cm on image 65/2 (previously 4.9 x 5.2 cm). Stomach/Bowel: No evidence of bowel wall thickening, distention or surrounding inflammatory change. There is a distal transverse colostomy. The appendix appears normal. A large amount of stool is present in the rectum. Vascular/Lymphatic: There are no enlarged abdominal or pelvic lymph nodes. Aortic and branch vessel atherosclerosis without acute vascular findings. Reproductive: Previous trans urethral resection of the prostate gland. Irregular enhancing nodularity along the bladder base, similar to previous study. Other: Diffuse soft tissue stranding and ill-defined fat planes along the pelvic sidewalls, similar to previous study. No ascites or free air. No focal inflammatory changes are identified within the anterior abdominal wall related to the colostomy or enlarging diverticulum projecting anteriorly from the bladder through the lower anterior abdominal wall. Musculoskeletal: No acute osseous findings or evidence of bone destruction. There are degenerative changes throughout the spine with ankylosis of the L1 through L3 vertebral bodies. There are moderately advanced degenerative changes at both hip status post proximal right femoral ORIF. IMPRESSION: 1. Persistent abnormal appearance of the urinary bladder status post trans urethral resection of the prostate gland. There is persistent irregular enhancing nodularity along the bladder base, suspicious for residual/recurrent bladder cancer. 2. Enlarging diverticular-like structure extending anteriorly from the bladder and herniating between the rectus abdominus muscles and the lower anterior abdominal wall. Again, this could be related to tumor and bladder outlet obstruction versus tissue necrosis/infection. 3. No evidence of distant  metastatic disease. 4. Bilateral percutaneous nephrostomies remain in place. No hydronephrosis. 5. Aortic Atherosclerosis (ICD10-I70.0). Electronically Signed   By: Richardean Sale M.D.   On: 09/09/2019 13:29   Dg Chest Portable 1 View  Result Date: 09/25/2019 CLINICAL DATA:  Sepsis, mental status decline. EXAM: PORTABLE CHEST 1 VIEW COMPARISON:  Chest CT 08/08/2019 FINDINGS: Power injectable right Port-A-Cath noted, tip  projecting over the SVC. Mild bilateral interstitial accentuation in both lungs with some hazy density in the infrahilar regions potentially from atypical pneumonia or noncardiogenic edema. No cardiomegaly. Atherosclerotic calcification of the aortic arch. No blunting of the costophrenic angles. IMPRESSION: 1. Hazy infrahilar airspace opacity in the lungs bilaterally, potentially from atypical pneumonia or noncardiogenic edema. 2. Atherosclerotic calcification of the aortic arch. 3. Power injectable right Port-A-Cath tip: SVC. Electronically Signed   By: Van Clines M.D.   On: 09/25/2019 20:19   Ir Nephrostomy Exchange Left  Result Date: 09/13/2019 CLINICAL DATA:  Bladder carcinoma. Long term indwelling bilateral nephrostomy catheters, presents for scheduled exchange. No current problems with drain catheters. EXAM: BILATERAL PERCUTANEOUS NEPHROSTOMY CATHETER EXCHANGE UNDER FLUOROSCOPY TECHNIQUE: The procedure, risks (including but not limited to bleeding, infection, organ damage ), benefits, and alternatives were explained to the patient and spouse. Questions regarding the procedure were encouraged and answered. The patient understands and consents to the procedure. The nephrostomy tubes and surrounding skin were prepped with Betadine, draped in usual sterile fashion. 1% lidocaine subcutaneous placed around both skin entry sites. A small amount of contrast was injected through the left nephrostomy catheter to opacify the renal collecting system. The catheter was cut and exchanged  over a 0.035" angiographic wire for a new 12 pigtail catheter, formed centrally within the collecting system under fluoroscopy. Contrast injection confirms appropriate positioning. In a similar fashion, the right nephrostomy catheter was injected, cut, and exchanged for a new 12 pigtail catheter, formed centrally within the right renal collecting system. Injection confirms appropriate positioning and patency. Both catheters were secured externally with a Statlock devices and 0 Prolene sutures. The patient tolerated the procedure well. FLUOROSCOPY TIME:  1.2 minute; 4 5 uGym2 DAP COMPLICATIONS: None immediate IMPRESSION: 1. Technically successful exchange of bilateral nephrostomy catheters under fluoroscopy Electronically Signed   By: Lucrezia Europe M.D.   On: 09/13/2019 09:50   Ir Nephrostomy Exchange Right  Result Date: 09/13/2019 CLINICAL DATA:  Bladder carcinoma. Long term indwelling bilateral nephrostomy catheters, presents for scheduled exchange. No current problems with drain catheters. EXAM: BILATERAL PERCUTANEOUS NEPHROSTOMY CATHETER EXCHANGE UNDER FLUOROSCOPY TECHNIQUE: The procedure, risks (including but not limited to bleeding, infection, organ damage ), benefits, and alternatives were explained to the patient and spouse. Questions regarding the procedure were encouraged and answered. The patient understands and consents to the procedure. The nephrostomy tubes and surrounding skin were prepped with Betadine, draped in usual sterile fashion. 1% lidocaine subcutaneous placed around both skin entry sites. A small amount of contrast was injected through the left nephrostomy catheter to opacify the renal collecting system. The catheter was cut and exchanged over a 0.035" angiographic wire for a new 12 pigtail catheter, formed centrally within the collecting system under fluoroscopy. Contrast injection confirms appropriate positioning. In a similar fashion, the right nephrostomy catheter was injected, cut, and  exchanged for a new 12 pigtail catheter, formed centrally within the right renal collecting system. Injection confirms appropriate positioning and patency. Both catheters were secured externally with a Statlock devices and 0 Prolene sutures. The patient tolerated the procedure well. FLUOROSCOPY TIME:  1.2 minute; 4 5 uGym2 DAP COMPLICATIONS: None immediate IMPRESSION: 1. Technically successful exchange of bilateral nephrostomy catheters under fluoroscopy Electronically Signed   By: Lucrezia Europe M.D.   On: 09/13/2019 09:50     Antimicrobials:   Vancomycin, cefepime and Flagyl day 3   Subjective: No acute events overnight Patient reports some mild abdominal pain at site of abscess  Objective: Vitals:  10/05/19 2140 10/06/19 0453 10/06/19 2007 10/07/19 0426  BP: 117/65 114/61 (!) 103/54 (!) 110/58  Pulse: 99 (!) 104 97 (!) 103  Resp: 16 16 16 18   Temp: 97.9 F (36.6 C) 97.6 F (36.4 C) 97.6 F (36.4 C) (!) 97.5 F (36.4 C)  TempSrc: Oral Oral Oral Oral  SpO2: 97% 97% 97% 95%  Weight:      Height:        Intake/Output Summary (Last 24 hours) at 10/07/2019 1152 Last data filed at 10/07/2019 0929 Gross per 24 hour  Intake 1175.84 ml  Output 1535 ml  Net -359.16 ml   Filed Weights   10/05/19 1418 10/05/19 2139  Weight: 65.8 kg 66.6 kg    Examination:  General exam: Appears calm and comfortable  Respiratory system: Clear to auscultation. Respiratory effort normal. Cardiovascular system: S1 & S2 heard, RRR. No JVD, murmurs, rubs, gallops or clicks. No pedal edema. Gastrointestinal system: Abdomen with colostomy bag no output no signs of infection, post I&D wound on abdominal wall no evidence of bleeding or exuding purulence through dressing at this point time, did not take dressing down AS surgery plans to evaluate and dressing it just been changed Central nervous system: Alert,  Extremities: Warm well perfused  skin: Lesions abdominal wall as above otherwise no rashes,  lesions or ulcers Psychiatry: Judgement and insight are poor. Mood & affect flat    Data Reviewed: I have personally reviewed following labs and imaging studies  CBC: Recent Labs  Lab 10/05/19 1427 10/06/19 0504 10/07/19 0625  WBC 63.7* 51.9* 49.5*  NEUTROABS 51.9*  --  41.1*  HGB 11.9* 10.4* 9.6*  HCT 36.4* 31.8* 29.6*  MCV 85.0 86.4 87.3  PLT 293 329 735   Basic Metabolic Panel: Recent Labs  Lab 10/05/19 1427 10/06/19 0504 10/07/19 0625  NA 133* 137 139  K 3.8 3.4* 3.0*  CL 100 106 108  CO2 21* 22 20*  GLUCOSE 133* 118* 100*  BUN 20 17 13   CREATININE 0.92 0.86 0.88  CALCIUM 8.5* 8.4* 8.2*   GFR: Estimated Creatinine Clearance: 61 mL/min (by C-G formula based on SCr of 0.88 mg/dL). Liver Function Tests: Recent Labs  Lab 10/05/19 1427 10/06/19 0504  AST 13* 10*  ALT 9 7  ALKPHOS 130* 125  BILITOT 0.6 0.6  PROT 6.7 5.7*  ALBUMIN 2.9* 2.4*   No results for input(s): LIPASE, AMYLASE in the last 168 hours. No results for input(s): AMMONIA in the last 168 hours. Coagulation Profile: No results for input(s): INR, PROTIME in the last 168 hours. Cardiac Enzymes: No results for input(s): CKTOTAL, CKMB, CKMBINDEX, TROPONINI in the last 168 hours. BNP (last 3 results) No results for input(s): PROBNP in the last 8760 hours. HbA1C: No results for input(s): HGBA1C in the last 72 hours. CBG: No results for input(s): GLUCAP in the last 168 hours. Lipid Profile: No results for input(s): CHOL, HDL, LDLCALC, TRIG, CHOLHDL, LDLDIRECT in the last 72 hours. Thyroid Function Tests: No results for input(s): TSH, T4TOTAL, FREET4, T3FREE, THYROIDAB in the last 72 hours. Anemia Panel: No results for input(s): VITAMINB12, FOLATE, FERRITIN, TIBC, IRON, RETICCTPCT in the last 72 hours. Sepsis Labs: Recent Labs  Lab 10/05/19 1427 10/05/19 1619  LATICACIDVEN 2.7* 1.4    Recent Results (from the past 240 hour(s))  Blood Culture (routine x 2)     Status: None (Preliminary  result)   Collection Time: 10/05/19  4:18 PM   Specimen: BLOOD  Result Value Ref Range Status  Specimen Description BLOOD BLOOD RIGHT WRIST  Final   Special Requests   Final    BOTTLES DRAWN AEROBIC AND ANAEROBIC Blood Culture adequate volume   Culture   Final    NO GROWTH 2 DAYS Performed at [facility], [address] [place], [place] [address]    Report Status PENDING  Incomplete  Blood Culture (routine x 2)     Status: None (Preliminary result)   Collection Time: 10/05/19  4:18 PM   Specimen: BLOOD  Result Value Ref Range Status   Specimen Description BLOOD BLOOD LEFT WRIST  Final   Special Requests   Final    BOTTLES DRAWN AEROBIC AND ANAEROBIC Blood Culture results may not be optimal due to an inadequate volume of blood received in culture bottles   Culture   Final    NO GROWTH 2 DAYS Performed at [facility] [facility], [address], [place], [place] [address]    Report Status PENDING  Incomplete  Urine culture     Status: Abnormal   Collection Time: 10/05/19  4:18 PM   Specimen: In/Out Cath Urine  Result Value Ref Range Status   Specimen Description   Final    IN/OUT CATH URINE Performed at [facility], [address], [place], [place] [address]    Special Requests   Final    NONE Performed at [facility], [address] [place], [place] [address]    Culture MULTIPLE SPECIES PRESENT, SUGGEST RECOLLECTION (A)  Final   Report Status 10/07/2019 FINAL  Final  SARS CORONAVIRUS 2 (TAT 6-24 HRS) Nasopharyngeal Nasopharyngeal Swab     Status: None   Collection Time: 10/05/19  4:18 PM   Specimen: Nasopharyngeal Swab  Result Value Ref Range Status   SARS Coronavirus 2 NEGATIVE NEGATIVE Final    Comment: (NOTE) SARS-CoV-2 target nucleic acids are NOT DETECTED. The SARS-CoV-2 RNA is generally detectable in upper and lower respiratory specimens during the acute phase of infection. Negative results do not preclude SARS-CoV-2 infection, do  not rule out co-infections with other pathogens, and should not be used as the sole basis for treatment or other patient management decisions. Negative results must be combined with clinical observations, patient history, and epidemiological information. The expected result is Negative. Fact Sheet for Patients: SugarRoll.be Fact Sheet for Healthcare Providers: https://www.woods-mathews.com/ This test is not yet approved or cleared by the [place] FDA and  has been authorized for detection and/or diagnosis of SARS-CoV-2 by FDA under an Emergency Use Authorization (EUA). This EUA will remain  in effect (meaning this test can be used) for the duration of the COVID-19 declaration under Section 56 4(b)(1) of the Act, 21 U.S.C. section 360bbb-3(b)(1), unless the authorization is terminated or revoked sooner. Performed at [facility], [place] [address] [place], [place] [address]          Radiology Studies: Ct Abdomen Pelvis W Contrast  Result Date: 10/05/2019 CLINICAL DATA:  84 year old male with history of lower abdominal pain from an abscess. EXAM: CT ABDOMEN AND PELVIS WITH CONTRAST TECHNIQUE: Multidetector CT imaging of the abdomen and pelvis was performed using the standard protocol following bolus administration of intravenous contrast. CONTRAST:  165mL OMNIPAQUE IOHEXOL 300 MG/ML  SOLN COMPARISON:  CT the abdomen and pelvis 09/25/2019. FINDINGS: Lower chest: Small right pleural effusion lying dependently. Areas of subsegmental atelectasis in the lower lobes of the lungs bilaterally. Atherosclerotic calcifications in the descending thoracic aorta as well as the left anterior descending, left circumflex  and right coronary arteries. Hepatobiliary: No suspicious cystic or solid hepatic lesions. No intra or extrahepatic biliary ductal dilatation. Status post cholecystectomy. Pancreas: No pancreatic mass. No pancreatic ductal dilatation. No  pancreatic or peripancreatic fluid collections or inflammatory changes. Spleen: Unremarkable. Adrenals/Urinary Tract: Percutaneous nephrostomy tubes are noted with tips reformed in the renal collecting systems bilaterally. 2.8 cm low-attenuation lesion in the anterior aspect of the interpolar region of the right kidney, compatible with a simple cyst. Subcentimeter low-attenuation lesion in the anterior aspect of the lower pole the left kidney, too small to characterize, but statistically likely to represent a tiny cyst. No hydroureteronephrosis. Urinary bladder is enlarged measuring approximately 8.6 x 14.5 x 8.8 cm and grossly distorted with extensive multifocal mural thickening and widespread areas of internal soft tissue thickening and enhancing soft tissue, with a combination of fluid and gas within the bladder, concerning for progressively enlarging bladder neoplasm. This appears to extend through the lower anterior abdominal wall where there is a small gas and fluid collection in the subcutaneous fat immediately superficial to the anterior abdominal wall musculature measuring 8.0 x 1.7 x 5.0 cm (axial image 68 of series 2 and sagittal image 64 of series 6). Bilateral adrenal glands are normal in appearance. Stomach/Bowel: Normal appearance of the stomach. No pathologic dilatation of small bowel or colon. A few scattered colonic diverticulae are noted, without surrounding inflammatory changes to suggest an acute diverticulitis at this time. Mid transverse colon loop colostomy. Normal appendix. Vascular/Lymphatic: Aortic atherosclerosis, without evidence of aneurysm or dissection in the abdominal or pelvic vasculature. No lymphadenopathy noted in the abdomen or pelvis. Reproductive: Prostate gland is completely obscured by the adjacent bladder mass, likely from direct invasion. Seminal vesicles are not confidently identified. Other: No significant volume of ascites.  No pneumoperitoneum. Musculoskeletal: There  are no aggressive appearing lytic or blastic lesions noted in the visualized portions of the skeleton. IMPRESSION: 1. Progressively enlarging heterogeneously enhancing bladder mass which now appears to have eroded through the inferior aspect of the anterior abdominal wall musculature, with small superficial abscess just deep to the skin surface in the low anatomic pelvis. 2. Bilateral nephrostomy tubes are stable in position. No hydroureteronephrosis. 3. Small right pleural effusion lying dependently. 4. Aortic atherosclerosis, in addition to least 3 vessel coronary artery disease. 5. Additional incidental findings, as above, similar to prior studies. Electronically Signed   By: Vinnie Langton M.D.   On: 10/05/2019 18:10        Scheduled Meds:  Chlorhexidine Gluconate Cloth  6 each Topical Daily   mirabegron ER  50 mg Oral Daily   Continuous Infusions:  sodium chloride Stopped (10/05/19 1833)   ceFEPime (MAXIPIME) IV 2 g (10/07/19 1042)   dextrose 5 % and 0.45% NaCl 100 mL/hr at 10/07/19 1040   metronidazole 500 mg (10/07/19 0909)   potassium chloride 10 mEq (10/07/19 1049)   vancomycin Stopped (10/06/19 2355)     LOS: 2 days    Time spent: 57 min    Nicolette Bang, MD [facility]  If 7PM-7AM, please contact night-coverage  10/07/2019, 11:52 AM

## 2019-10-08 ENCOUNTER — Ambulatory Visit: Payer: Medicare Other

## 2019-10-08 ENCOUNTER — Ambulatory Visit
Admission: RE | Admit: 2019-10-08 | Discharge: 2019-10-08 | Disposition: A | Discharge status: Home / Self Care | Payer: Medicare Other | Source: Ambulatory Visit | Attending: Radiation Oncology | Admitting: Radiation Oncology

## 2019-10-08 DIAGNOSIS — C799 Secondary malignant neoplasm of unspecified site: Secondary | ICD-10-CM | POA: Insufficient documentation

## 2019-10-08 DIAGNOSIS — C679 Malignant neoplasm of bladder, unspecified: Secondary | ICD-10-CM | POA: Insufficient documentation

## 2019-10-08 LAB — BASIC METABOLIC PANEL
Anion gap: 10 (ref 5–15)
BUN: 10 mg/dL (ref 8–23)
CO2: 17 mmol/L — ABNORMAL LOW (ref 22–32)
Calcium: 8.1 mg/dL — ABNORMAL LOW (ref 8.9–10.3)
Chloride: 109 mmol/L (ref 98–111)
Creatinine, Ser: 0.82 mg/dL (ref 0.61–1.24)
GFR calc Af Amer: >60 mL/min (ref >60)
GFR calc non Af Amer: >60 mL/min (ref >60)
Glucose, Bld: 122 mg/dL — ABNORMAL HIGH (ref 70–99)
Potassium: 2.9 mmol/L — ABNORMAL LOW (ref 3.5–5.1)
Sodium: 136 mmol/L (ref 135–145)

## 2019-10-08 LAB — CBC WITH DIFFERENTIAL/PLATELET
Abs Immature Granulocytes: 2.68 10*3/uL — ABNORMAL HIGH (ref 0.00–0.07)
Basophils Absolute: 0.1 10*3/uL (ref 0.0–0.1)
Basophils Relative: 0 %
Eosinophils Absolute: 0.9 10*3/uL — ABNORMAL HIGH (ref 0.0–0.5)
Eosinophils Relative: 1 %
HCT: 31.6 % — ABNORMAL LOW (ref 39.0–52.0)
Hemoglobin: 10.2 g/dL — ABNORMAL LOW (ref 13.0–17.0)
Immature Granulocytes: 4 %
Lymphocytes Relative: 6 %
Lymphs Abs: 3.6 10*3/uL (ref 0.7–4.0)
MCH: 27.7 pg (ref 26.0–34.0)
MCHC: 32.3 g/dL (ref 30.0–36.0)
MCV: 85.9 fL (ref 80.0–100.0)
Monocytes Absolute: 2.5 10*3/uL — ABNORMAL HIGH (ref 0.1–1.0)
Monocytes Relative: 4 %
Neutro Abs: 51.5 10*3/uL — ABNORMAL HIGH (ref 1.7–7.7)
Neutrophils Relative %: 85 %
Platelets: 380 10*3/uL (ref 150–400)
RBC: 3.68 MIL/uL — ABNORMAL LOW (ref 4.22–5.81)
RDW: 18.5 % — ABNORMAL HIGH (ref 11.5–15.5)
Smear Review: NORMAL
WBC: 61.2 10*3/uL (ref 4.0–10.5)
nRBC: 0 % (ref 0.0–0.2)

## 2019-10-08 LAB — MAGNESIUM: Magnesium: 1.7 mg/dL (ref 1.7–2.4)

## 2019-10-08 MED ORDER — MAGNESIUM SULFATE 2 GM/50ML IV SOLN
2.0000 g | Freq: Once | INTRAVENOUS | Status: AC
  Administered 2019-10-08: 2 g via INTRAVENOUS
  Filled 2019-10-08: qty 50

## 2019-10-08 MED ORDER — KCL IN DEXTROSE-NACL 40-5-0.45 MEQ/L-%-% IV SOLN
INTRAVENOUS | Status: DC
  Administered 2019-10-08 – 2019-10-09 (×2): via INTRAVENOUS
  Filled 2019-10-08 (×4): qty 1000

## 2019-10-08 NOTE — Consult Note (Signed)
PHARMACY CONSULT NOTE  Pharmacy Consult for Electrolyte Monitoring and Replacement   Recent Labs: Potassium (mmol/L)  Date Value  10/08/2019 2.9 (L)   Magnesium (mg/dL)  Date Value  09/28/2019 1.9   Calcium (mg/dL)  Date Value  10/08/2019 8.1 (L)   Albumin (g/dL)  Date Value  10/06/2019 2.4 (L)   Phosphorus (mg/dL)  Date Value  06/09/2019 3.1   Sodium (mmol/L)  Date Value  10/08/2019 136   Corrected Ca: 9.4 mg/dL Add-On Magnesium: 1.7 mg/dL  Assessment: 84 y.o.malewith medical history significant forlocally-metastatic bladder cancer on chemotherapy, urinary tract obstruction with b/l nephrostomy tubes in place, recurrent UTI, htn, colostomy in place, provoked dvt s/p anticoagulation, who presents with an abscess with sepsis - secondary to locally metastatic bladder cancerconcerning forerosionthrough the anterior abdominal wall. Prior to this note the patient was receiving IVMF D51/2NS at 100 mL/hr  Goal of Therapy:  Electrolytes WNL  Plan:   Add 40 mEq/L to D51/2NS and continue at 100 mL/hr  Magnesium is borderline low: replace with 2 grams IV magnesium sulfate  BMP, magnesium in am  Dallie Piles ,PharmD Clinical Pharmacist 10/08/2019 12:12 PM

## 2019-10-08 NOTE — Progress Notes (Signed)
PROGRESS NOTE    Vincent Poole  PXT:[id number] DOB: Mar 26, 1935 DOA: 10/05/2019 PCP: [name], MD   Brief Narrative:  [name] a 84 y.o.malewith medical history significant forlocally-metastatic bladder cancer on chemotherapy, urinary tract obstruction with b/l nephrostomy tubes in place, recurrent UTI, htn, colostomy in place, provoked dvt s/p anticoagulation, who presents with a red "lump" on his lower abdomen in the suprapubic region for several months.  This was noted at recent hospitalization, thought to be 2/2 metastatic bladder cancer. Patient reports that since discharge has had occasional fever,  Lives at home with wife. Mostly bed-bound.  ED Course:manual expression of, per ED provider, approximately 30 cc pus from suprapubic wound. Keflex and metronidazole given. Labs and CT abdomen/pelvis ordered.  11/30: Patient's been seen by general surgery, case discussed with urology, recommending against aggressive invasive procedures as this is secondary to significant bladder cancer.  Dr. [name] also on board indicates patient and family has been resistant to hospice despite dismal prognosis.  12/1: Patients wife at bedside.  Patient apparently transported to radiation therapy this morning.  Endorses tenderness near abd wall abscess site.  Surgery signed off case   Assessment & Plan:   Principal Problem:   Intra-abdominal infection Active Problems:   Sepsis ([place])   Essential hypertension   Prostate cancer ([facility])   Urinary obstruction   Malignant neoplasm of urinary bladder ([facility])   Pressure injury of skin   S/P partial resection of colon   Abscess with sepsis  - secondary to locally metastatic bladder cancer concerning for erosion through the anterior abdominal wall.  -Continue with broad-spectrum antibiotics including Vanco cefepime and metronidazole an additional day  -General surgery saw the patient in consultation, discussed case with urology,  because of significant tumor burden with cutaneous fistula recommend against aggressive surgical intervention. -Continue local wound care, antibiotics as above -Blood cultures negative to date and urine cultures suggest recollection - Will re-evaluate abscess and laboratory data in AM to assess response   Metastatic bladder cancer  - on padcev, last dose earlier this month per wife, next dose due next month.  -Plan to start radiation therapy on 11/30 but this was canceled secondary to infection - Patient was transported to radiation therapy this morning, wish to continue at this time -Oncology aware, patient and family resistant to hospice, dismal prognosis  Acute on chronic leukocytosis  -Reviewed medical history patient appears to be have a chronic leukocytosis -Of note it was normal approximately 1 year ago, patient is being followed by oncology -Likely worse because of superimposed infection -We will follow-up cultures as above continue antibiotics  bilateral nephrostomy tubes  - 2/2 hx bladder obstruction - ostomy care -cont home myrbetric  Colostomy - outpt per wife and patient is normal - ostomy care  stage 1 sacral decub POA - wound care  DVT prophylaxis: SCD/Compression stockings  Code Status: DNR    Code Status Orders  (From admission, onward)         Start     Ordered   10/05/19 1925  Do not attempt resuscitation (DNR)  Continuous    Question Answer Comment  In the event of cardiac or respiratory ARREST Do not call a code blue   In the event of cardiac or respiratory ARREST Do not perform Intubation, CPR, defibrillation or ACLS   In the event of cardiac or respiratory ARREST Use medication by any route, position, wound care, and other measures to relive pain and suffering. May use oxygen, suction  and manual treatment of airway obstruction as needed for comfort.      10/05/19 1924        Code Status History    Date Active Date Inactive Code Status  Order ID Comments User Context   09/25/2019 2326 09/28/2019 1718 DNR [id number]  Rise Patience, MD ED   09/25/2019 2206 09/25/2019 2326 DNR [id number]  Merlyn Lot, MD ED   06/07/2019 1651 06/12/2019 1937 DNR [id number]  Basilio Cairo, NP Inpatient   06/07/2019 0103 06/07/2019 1651 Full Code [id number]  Mayer Camel, NP Inpatient   04/26/2019 0420 04/29/2019 1913 Full Code [id number]  Mansy, Arvella Merles, MD ED   04/08/2019 1211 04/10/2019 1628 Full Code [id number]  Gladstone Lighter, MD ED   03/22/2019 2337 03/26/2019 1934 Full Code [id number]  Lance Coon, MD Inpatient   02/21/2019 1505 02/26/2019 1849 Full Code [id number]  Mayo, Pete Pelt, MD Inpatient   11/12/2018 1849 11/16/2018 1632 Full Code [id number]  Epifanio Lesches, MD Inpatient   05/01/2018 1702 05/06/2018 1711 Full Code [id number]  Dustin Flock, MD Inpatient   12/26/2017 2141 12/30/2017 1727 Full Code [id number]  Demetrios Loll, MD Inpatient   05/31/2017 0208 06/01/2017 1922 Full Code [id number]  Hugelmeyer, Glouster, DO Inpatient   03/04/2017 1833 03/05/2017 1426 Full Code [id number]  Idelle Crouch, MD Inpatient   07/11/2016 0538 07/13/2016 1420 Full Code [id number]  Saundra Shelling, MD ED   Advance Care Planning Activity     Family Communication: Discussed with wife 12/1 answered all questions explained the very poor prognosis and no surgery at this point Disposition Plan:   Patient remained in the hospital for IV antibiotics. Follow-up cultures, expert subspecialty consultation . Patient not medically ready or stable for discharge Consults called:  Admission status: Inpatient   Consultants:   GEN SURGERY  Procedures:  Ct Head Wo Contrast  Result Date: 09/25/2019 CLINICAL DATA:  Seizure EXAM: CT HEAD WITHOUT CONTRAST TECHNIQUE: Contiguous axial images were obtained from the base of the skull through the vertex without intravenous contrast. COMPARISON:  None. FINDINGS: Brain: There is atrophy and chronic small vessel disease changes. No acute  intracranial abnormality. Specifically, no hemorrhage, hydrocephalus, mass lesion, acute infarction, or significant intracranial injury. Vascular: No hyperdense vessel or unexpected calcification. Skull: No acute calvarial abnormality. Sinuses/Orbits: Visualized paranasal sinuses and mastoids clear. Orbital soft tissues unremarkable. Other: None IMPRESSION: Atrophy, chronic microvascular disease. No acute intracranial abnormality. Electronically Signed   By: Rolm Baptise M.D.   On: 09/25/2019 20:42   Ct Abdomen Pelvis W Contrast  Result Date: 10/05/2019 CLINICAL DATA:  84 year old male with history of lower abdominal pain from an abscess. EXAM: CT ABDOMEN AND PELVIS WITH CONTRAST TECHNIQUE: Multidetector CT imaging of the abdomen and pelvis was performed using the standard protocol following bolus administration of intravenous contrast. CONTRAST:  125mL OMNIPAQUE IOHEXOL 300 MG/ML  SOLN COMPARISON:  CT the abdomen and pelvis 09/25/2019. FINDINGS: Lower chest: Small right pleural effusion lying dependently. Areas of subsegmental atelectasis in the lower lobes of the lungs bilaterally. Atherosclerotic calcifications in the descending thoracic aorta as well as the left anterior descending, left circumflex and right coronary arteries. Hepatobiliary: No suspicious cystic or solid hepatic lesions. No intra or extrahepatic biliary ductal dilatation. Status post cholecystectomy. Pancreas: No pancreatic mass. No pancreatic ductal dilatation. No pancreatic or peripancreatic fluid collections or inflammatory changes. Spleen: Unremarkable. Adrenals/Urinary Tract: Percutaneous nephrostomy tubes are noted with tips reformed in the renal collecting systems bilaterally. 2.8 cm low-attenuation lesion in the anterior aspect of  the interpolar region of the right kidney, compatible with a simple cyst. Subcentimeter low-attenuation lesion in the anterior aspect of the lower pole the left kidney, too small to characterize, but  statistically likely to represent a tiny cyst. No hydroureteronephrosis. Urinary bladder is enlarged measuring approximately 8.6 x 14.5 x 8.8 cm and grossly distorted with extensive multifocal mural thickening and widespread areas of internal soft tissue thickening and enhancing soft tissue, with a combination of fluid and gas within the bladder, concerning for progressively enlarging bladder neoplasm. This appears to extend through the lower anterior abdominal wall where there is a small gas and fluid collection in the subcutaneous fat immediately superficial to the anterior abdominal wall musculature measuring 8.0 x 1.7 x 5.0 cm (axial image 68 of series 2 and sagittal image 64 of series 6). Bilateral adrenal glands are normal in appearance. Stomach/Bowel: Normal appearance of the stomach. No pathologic dilatation of small bowel or colon. A few scattered colonic diverticulae are noted, without surrounding inflammatory changes to suggest an acute diverticulitis at this time. Mid transverse colon loop colostomy. Normal appendix. Vascular/Lymphatic: Aortic atherosclerosis, without evidence of aneurysm or dissection in the abdominal or pelvic vasculature. No lymphadenopathy noted in the abdomen or pelvis. Reproductive: Prostate gland is completely obscured by the adjacent bladder mass, likely from direct invasion. Seminal vesicles are not confidently identified. Other: No significant volume of ascites.  No pneumoperitoneum. Musculoskeletal: There are no aggressive appearing lytic or blastic lesions noted in the visualized portions of the skeleton. IMPRESSION: 1. Progressively enlarging heterogeneously enhancing bladder mass which now appears to have eroded through the inferior aspect of the anterior abdominal wall musculature, with small superficial abscess just deep to the skin surface in the low anatomic pelvis. 2. Bilateral nephrostomy tubes are stable in position. No hydroureteronephrosis. 3. Small right pleural  effusion lying dependently. 4. Aortic atherosclerosis, in addition to least 3 vessel coronary artery disease. 5. Additional incidental findings, as above, similar to prior studies. Electronically Signed   By: Vinnie Langton M.D.   On: 10/05/2019 18:10   Ct Abdomen Pelvis W Contrast  Result Date: 09/25/2019 CLINICAL DATA:  Acute abdominal pain. Fever. EXAM: CT ABDOMEN AND PELVIS WITH CONTRAST TECHNIQUE: Multidetector CT imaging of the abdomen and pelvis was performed using the standard protocol following bolus administration of intravenous contrast. CONTRAST:  147mL OMNIPAQUE IOHEXOL 300 MG/ML  SOLN COMPARISON:  09/09/2019 FINDINGS: Lower chest: Aortic atherosclerosis. Extensive coronary artery calcifications. Heart size is normal. No pericardial effusion. Tiny bilateral pleural effusions with minimal bibasilar atelectasis. Hepatobiliary: No focal liver abnormality is seen. Status post cholecystectomy. No biliary dilatation. Pancreas: Unremarkable. No pancreatic ductal dilatation or surrounding inflammatory changes. Spleen: Normal in size without focal abnormality. Adrenals/Urinary Tract: Adrenal glands are normal. Bilateral nephrostomy tubes in place. Slight dilatation of the right renal pelvis and proximal right ureter. 21 mm simple cyst on the mid right kidney. Again noted is a large irregular inhomogeneous mass in the bladder which appears larger than on the prior study. There are irregular fluid collections containing gas in that region consistent with infection. Gas collection protrudes through the midline of the anterior abdominal wall as demonstrated on the prior exam. Stomach/Bowel: Large amount of stool in the rectum consistent with a fecal impaction. Colostomy in the mid transverse colon. Appendix is normal. No significant change since the prior study. Vascular/Lymphatic: Aortic atherosclerosis. No enlarged abdominal or pelvic lymph nodes. Reproductive: Prostate gland is not identified. Other: No  ascites. Musculoskeletal: No acute abnormality. Severe arthritic changes of  both hips, right greater than left. IMPRESSION: 1. Interval enlargement of the large irregular inhomogeneous mass in the bladder consistent with the patient's known bladder carcinoma. 2. Large amount of stool in the rectum consistent with fecal impaction, unchanged. 3. Bilateral nephrostomy tubes in place. Slight dilatation of the right renal pelvis and proximal right ureter. 4. Tiny bilateral pleural effusions with minimal bibasilar atelectasis. 5. Severe arthritic changes of both hips, right greater than left. 6. Aortic Atherosclerosis (ICD10-I70.0). Electronically Signed   By: Lorriane Shire M.D.   On: 09/25/2019 20:47   Ct Abdomen Pelvis W Contrast  Result Date: 09/09/2019 CLINICAL DATA:  Restaging prostate cancer and urothelial carcinoma diagnosed 1 year ago. History of bilateral nephrostomies, partial colon resection and cholecystectomy. Reported area of erythema and swelling in the abdominal wall. EXAM: CT ABDOMEN AND PELVIS WITH CONTRAST TECHNIQUE: Multidetector CT imaging of the abdomen and pelvis was performed using the standard protocol following bolus administration of intravenous contrast. CONTRAST:  142mL OMNIPAQUE IOHEXOL 300 MG/ML  SOLN COMPARISON:  CT 08/08/2019 and 06/06/2019. FINDINGS: Lower chest: Stable mild emphysematous changes and scarring at both lung bases. The small right pleural effusion noted previously has resolved. Hepatobiliary: The liver is normal in density without suspicious focal abnormality. No significant biliary dilatation post cholecystectomy. Pancreas: Unremarkable. No pancreatic ductal dilatation or surrounding inflammatory changes. Spleen: Normal in size without focal abnormality. Adrenals/Urinary Tract: The adrenal glands appear stable without suspicious findings. Bilateral percutaneous nephrostomies remain in place. There is bilateral renal cortical thinning with contrast excretion into the  renal collecting systems on the delayed images. There is a stable cyst anteriorly in the mid right kidney. No hydronephrosis. Grossly abnormal urinary bladder again noted with irregular bladder wall thickening and enhancement. There are solid enhancing components measuring 4.5 x 2.9 cm on image 69/2 and 4.5 x 3.0 cm on image 72/2. There are internal air-fluid levels within the bladder lumen with an enlarging diverticular like structure herniating between the rectus abdominus muscles. This now measures up to 7.7 x 7.0 cm on image 65/2 (previously 4.9 x 5.2 cm). Stomach/Bowel: No evidence of bowel wall thickening, distention or surrounding inflammatory change. There is a distal transverse colostomy. The appendix appears normal. A large amount of stool is present in the rectum. Vascular/Lymphatic: There are no enlarged abdominal or pelvic lymph nodes. Aortic and branch vessel atherosclerosis without acute vascular findings. Reproductive: Previous trans urethral resection of the prostate gland. Irregular enhancing nodularity along the bladder base, similar to previous study. Other: Diffuse soft tissue stranding and ill-defined fat planes along the pelvic sidewalls, similar to previous study. No ascites or free air. No focal inflammatory changes are identified within the anterior abdominal wall related to the colostomy or enlarging diverticulum projecting anteriorly from the bladder through the lower anterior abdominal wall. Musculoskeletal: No acute osseous findings or evidence of bone destruction. There are degenerative changes throughout the spine with ankylosis of the L1 through L3 vertebral bodies. There are moderately advanced degenerative changes at both hip status post proximal right femoral ORIF. IMPRESSION: 1. Persistent abnormal appearance of the urinary bladder status post trans urethral resection of the prostate gland. There is persistent irregular enhancing nodularity along the bladder base, suspicious for  residual/recurrent bladder cancer. 2. Enlarging diverticular-like structure extending anteriorly from the bladder and herniating between the rectus abdominus muscles and the lower anterior abdominal wall. Again, this could be related to tumor and bladder outlet obstruction versus tissue necrosis/infection. 3. No evidence of distant metastatic disease. 4. Bilateral percutaneous nephrostomies  remain in place. No hydronephrosis. 5. Aortic Atherosclerosis (ICD10-I70.0). Electronically Signed   By: Richardean Sale M.D.   On: 09/09/2019 13:29   Dg Chest Portable 1 View  Result Date: 09/25/2019 CLINICAL DATA:  Sepsis, mental status decline. EXAM: PORTABLE CHEST 1 VIEW COMPARISON:  Chest CT 08/08/2019 FINDINGS: Power injectable right Port-A-Cath noted, tip projecting over the SVC. Mild bilateral interstitial accentuation in both lungs with some hazy density in the infrahilar regions potentially from atypical pneumonia or noncardiogenic edema. No cardiomegaly. Atherosclerotic calcification of the aortic arch. No blunting of the costophrenic angles. IMPRESSION: 1. Hazy infrahilar airspace opacity in the lungs bilaterally, potentially from atypical pneumonia or noncardiogenic edema. 2. Atherosclerotic calcification of the aortic arch. 3. Power injectable right Port-A-Cath tip: SVC. Electronically Signed   By: Van Clines M.D.   On: 09/25/2019 20:19   Ir Nephrostomy Exchange Left  Result Date: 09/13/2019 CLINICAL DATA:  Bladder carcinoma. Long term indwelling bilateral nephrostomy catheters, presents for scheduled exchange. No current problems with drain catheters. EXAM: BILATERAL PERCUTANEOUS NEPHROSTOMY CATHETER EXCHANGE UNDER FLUOROSCOPY TECHNIQUE: The procedure, risks (including but not limited to bleeding, infection, organ damage ), benefits, and alternatives were explained to the patient and spouse. Questions regarding the procedure were encouraged and answered. The patient understands and consents to the  procedure. The nephrostomy tubes and surrounding skin were prepped with Betadine, draped in usual sterile fashion. 1% lidocaine subcutaneous placed around both skin entry sites. A small amount of contrast was injected through the left nephrostomy catheter to opacify the renal collecting system. The catheter was cut and exchanged over a 0.035" angiographic wire for a new 12 pigtail catheter, formed centrally within the collecting system under fluoroscopy. Contrast injection confirms appropriate positioning. In a similar fashion, the right nephrostomy catheter was injected, cut, and exchanged for a new 12 pigtail catheter, formed centrally within the right renal collecting system. Injection confirms appropriate positioning and patency. Both catheters were secured externally with a Statlock devices and 0 Prolene sutures. The patient tolerated the procedure well. FLUOROSCOPY TIME:  1.2 minute; 4 5 uGym2 DAP COMPLICATIONS: None immediate IMPRESSION: 1. Technically successful exchange of bilateral nephrostomy catheters under fluoroscopy Electronically Signed   By: Lucrezia Europe M.D.   On: 09/13/2019 09:50   Ir Nephrostomy Exchange Right  Result Date: 09/13/2019 CLINICAL DATA:  Bladder carcinoma. Long term indwelling bilateral nephrostomy catheters, presents for scheduled exchange. No current problems with drain catheters. EXAM: BILATERAL PERCUTANEOUS NEPHROSTOMY CATHETER EXCHANGE UNDER FLUOROSCOPY TECHNIQUE: The procedure, risks (including but not limited to bleeding, infection, organ damage ), benefits, and alternatives were explained to the patient and spouse. Questions regarding the procedure were encouraged and answered. The patient understands and consents to the procedure. The nephrostomy tubes and surrounding skin were prepped with Betadine, draped in usual sterile fashion. 1% lidocaine subcutaneous placed around both skin entry sites. A small amount of contrast was injected through the left nephrostomy catheter  to opacify the renal collecting system. The catheter was cut and exchanged over a 0.035" angiographic wire for a new 12 pigtail catheter, formed centrally within the collecting system under fluoroscopy. Contrast injection confirms appropriate positioning. In a similar fashion, the right nephrostomy catheter was injected, cut, and exchanged for a new 12 pigtail catheter, formed centrally within the right renal collecting system. Injection confirms appropriate positioning and patency. Both catheters were secured externally with a Statlock devices and 0 Prolene sutures. The patient tolerated the procedure well. FLUOROSCOPY TIME:  1.2 minute; 4 5 uGym2 DAP COMPLICATIONS: None immediate  IMPRESSION: 1. Technically successful exchange of bilateral nephrostomy catheters under fluoroscopy Electronically Signed   By: Lucrezia Europe M.D.   On: 09/13/2019 09:50    Antimicrobials:   Vancomycin, cefepime and Flagyl day 3   Subjective: No acute events overnight Patient reports some mild abdominal pain at site of abscess Reports poor appetite  Objective: Vitals:   10/07/19 1229 10/07/19 2032 10/08/19 0438 10/08/19 1100  BP: (!) 109/57 (!) 108/55 (!) 109/59 109/60  Pulse: 97 (!) 102 (!) 102 92  Resp: 18 20 20  (!) 24  Temp: 97.6 F (36.4 C) 99.6 F (37.6 C) 98.1 F (36.7 C) 98.1 F (36.7 C)  TempSrc: Oral Oral Oral Axillary  SpO2: 96% 96% 96% 97%  Weight:      Height:        Intake/Output Summary (Last 24 hours) at 10/08/2019 1622 Last data filed at 10/08/2019 1300 Gross per 24 hour  Intake 3819.35 ml  Output 2015 ml  Net 1804.35 ml   Filed Weights   10/05/19 1418 10/05/19 2139  Weight: 65.8 kg 66.6 kg    Examination:  General exam: Appears calm and comfortable  Respiratory system: Clear to auscultation. Respiratory effort normal. Cardiovascular system: S1 & S2 heard, RRR. No JVD, murmurs, rubs, gallops or clicks. No pedal edema. Gastrointestinal system: Abdomen with colostomy bag no output no  signs of infection, post I&D wound on abdominal wall no evidence of bleeding or exuding purulence through dressing at this point time, did not take dressing down Central nervous system: Alert,  Extremities: Warm well perfused  skin: Lesions abdominal wall as above otherwise no rashes, lesions or ulcers Psychiatry: Judgement and insight are poor. Mood & affect flat    Data Reviewed: I have personally reviewed following labs and imaging studies  CBC: Recent Labs  Lab 10/05/19 1427 10/06/19 0504 10/07/19 0625 10/08/19 0637  WBC 63.7* 51.9* 49.5* 61.2*  NEUTROABS 51.9*  --  41.1* 51.5*  HGB 11.9* 10.4* 9.6* 10.2*  HCT 36.4* 31.8* 29.6* 31.6*  MCV 85.0 86.4 87.3 85.9  PLT 293 329 358 086   Basic Metabolic Panel: Recent Labs  Lab 10/05/19 1427 10/06/19 0504 10/07/19 0625 10/08/19 0637  NA 133* 137 139 136  K 3.8 3.4* 3.0* 2.9*  CL 100 106 108 109  CO2 21* 22 20* 17*  GLUCOSE 133* 118* 100* 122*  BUN 20 17 13 10   CREATININE 0.92 0.86 0.88 0.82  CALCIUM 8.5* 8.4* 8.2* 8.1*  MG  --   --   --  1.7   GFR: Estimated Creatinine Clearance: 65.4 mL/min (by C-G formula based on SCr of 0.82 mg/dL). Liver Function Tests: Recent Labs  Lab 10/05/19 1427 10/06/19 0504  AST 13* 10*  ALT 9 7  ALKPHOS 130* 125  BILITOT 0.6 0.6  PROT 6.7 5.7*  ALBUMIN 2.9* 2.4*   No results for input(s): LIPASE, AMYLASE in the last 168 hours. No results for input(s): AMMONIA in the last 168 hours. Coagulation Profile: No results for input(s): INR, PROTIME in the last 168 hours. Cardiac Enzymes: No results for input(s): CKTOTAL, CKMB, CKMBINDEX, TROPONINI in the last 168 hours. BNP (last 3 results) No results for input(s): PROBNP in the last 8760 hours. HbA1C: No results for input(s): HGBA1C in the last 72 hours. CBG: No results for input(s): GLUCAP in the last 168 hours. Lipid Profile: No results for input(s): CHOL, HDL, LDLCALC, TRIG, CHOLHDL, LDLDIRECT in the last 72 hours. Thyroid  Function Tests: No results for input(s): TSH, T4TOTAL,  FREET4, T3FREE, THYROIDAB in the last 72 hours. Anemia Panel: No results for input(s): VITAMINB12, FOLATE, FERRITIN, TIBC, IRON, RETICCTPCT in the last 72 hours. Sepsis Labs: Recent Labs  Lab 10/05/19 1427 10/05/19 1619  LATICACIDVEN 2.7* 1.4    Recent Results (from the past 240 hour(s))  Blood Culture (routine x 2)     Status: None (Preliminary result)   Collection Time: 10/05/19  4:18 PM   Specimen: BLOOD  Result Value Ref Range Status   Specimen Description BLOOD BLOOD RIGHT WRIST  Final   Special Requests   Final    BOTTLES DRAWN AEROBIC AND ANAEROBIC Blood Culture adequate volume   Culture   Final    NO GROWTH 3 DAYS Performed at [facility], [address] [place], [place] [address]    Report Status PENDING  Incomplete  Blood Culture (routine x 2)     Status: None (Preliminary result)   Collection Time: 10/05/19  4:18 PM   Specimen: BLOOD  Result Value Ref Range Status   Specimen Description BLOOD BLOOD LEFT WRIST  Final   Special Requests   Final    BOTTLES DRAWN AEROBIC AND ANAEROBIC Blood Culture results may not be optimal due to an inadequate volume of blood received in culture bottles   Culture   Final    NO GROWTH 3 DAYS Performed at [facility], [address] [place], [place] [address]    Report Status PENDING  Incomplete  Urine culture     Status: Abnormal   Collection Time: 10/05/19  4:18 PM   Specimen: In/Out Cath Urine  Result Value Ref Range Status   Specimen Description   Final    IN/OUT CATH URINE Performed at [facility], [address] [place], [place] [address]    Special Requests   Final    NONE Performed at [facility], [address] [place], [place] [address]    Culture MULTIPLE SPECIES PRESENT, SUGGEST RECOLLECTION (A)  Final   Report Status 10/07/2019 FINAL  Final  SARS CORONAVIRUS 2 (TAT 6-24 HRS) Nasopharyngeal Nasopharyngeal Swab      Status: None   Collection Time: 10/05/19  4:18 PM   Specimen: Nasopharyngeal Swab  Result Value Ref Range Status   SARS Coronavirus 2 NEGATIVE NEGATIVE Final    Comment: (NOTE) SARS-CoV-2 target nucleic acids are NOT DETECTED. The SARS-CoV-2 RNA is generally detectable in upper and lower respiratory specimens during the acute phase of infection. Negative results do not preclude SARS-CoV-2 infection, do not rule out co-infections with other pathogens, and should not be used as the sole basis for treatment or other patient management decisions. Negative results must be combined with clinical observations, patient history, and epidemiological information. The expected result is Negative. Fact Sheet for Patients: SugarRoll.be Fact Sheet for Healthcare Providers: https://www.woods-mathews.com/ This test is not yet approved or cleared by the [place] FDA and  has been authorized for detection and/or diagnosis of SARS-CoV-2 by FDA under an Emergency Use Authorization (EUA). This EUA will remain  in effect (meaning this test can be used) for the duration of the COVID-19 declaration under Section 56 4(b)(1) of the Act, 21 U.S.C. section 360bbb-3(b)(1), unless the authorization is terminated or revoked sooner. Performed at [facility], [address] [place], [place] [address]          Radiology Studies: No results found.      Scheduled Meds:  Chlorhexidine Gluconate Cloth  6 each Topical Daily   mirabegron  ER  50 mg Oral Daily   Continuous Infusions:  sodium chloride Stopped (10/05/19 1833)   ceFEPime (MAXIPIME) IV 2 g (10/08/19 1137)   dextrose 5 % and 0.45 % NaCl with KCl 40 mEq/L 100 mL/hr at 10/08/19 1501   magnesium sulfate bolus IVPB     metronidazole 500 mg (10/08/19 0917)   vancomycin Stopped (10/07/19 2233)     LOS: 3 days    Time spent: 35 min    Sidney Ace, MD [facility]  If 7PM-7AM, please contact night-coverage  10/08/2019, 4:22 PM

## 2019-10-08 NOTE — Care Management Important Message (Signed)
Important Message  Patient Details  Name: Vincent Poole MRN: [id number] Date of Birth: [birth year]   Medicare Important Message Given:  Yes     Dannette Barbara 10/08/2019, 11:14 AM

## 2019-10-09 ENCOUNTER — Ambulatory Visit
Admission: RE | Admit: 2019-10-09 | Discharge: 2019-10-09 | Disposition: A | Discharge status: Home / Self Care | Payer: Medicare Other | Source: Ambulatory Visit | Attending: Radiation Oncology | Admitting: Radiation Oncology

## 2019-10-09 LAB — BASIC METABOLIC PANEL
Anion gap: 7 (ref 5–15)
BUN: 10 mg/dL (ref 8–23)
CO2: 21 mmol/L — ABNORMAL LOW (ref 22–32)
Calcium: 8.2 mg/dL — ABNORMAL LOW (ref 8.9–10.3)
Chloride: 109 mmol/L (ref 98–111)
Creatinine, Ser: 0.85 mg/dL (ref 0.61–1.24)
GFR calc Af Amer: >60 mL/min (ref >60)
GFR calc non Af Amer: >60 mL/min (ref >60)
Glucose, Bld: 119 mg/dL — ABNORMAL HIGH (ref 70–99)
Potassium: 3.2 mmol/L — ABNORMAL LOW (ref 3.5–5.1)
Sodium: 137 mmol/L (ref 135–145)

## 2019-10-09 LAB — CBC WITH DIFFERENTIAL/PLATELET
Abs Immature Granulocytes: 2.33 10*3/uL — ABNORMAL HIGH (ref 0.00–0.07)
Basophils Absolute: 0.4 10*3/uL — ABNORMAL HIGH (ref 0.0–0.1)
Basophils Relative: 1 %
Eosinophils Absolute: 1 10*3/uL — ABNORMAL HIGH (ref 0.0–0.5)
Eosinophils Relative: 2 %
HCT: 31.6 % — ABNORMAL LOW (ref 39.0–52.0)
Hemoglobin: 10 g/dL — ABNORMAL LOW (ref 13.0–17.0)
Immature Granulocytes: 4 %
Lymphocytes Relative: 5 %
Lymphs Abs: 3.1 10*3/uL (ref 0.7–4.0)
MCH: 28.2 pg (ref 26.0–34.0)
MCHC: 31.6 g/dL (ref 30.0–36.0)
MCV: 89 fL (ref 80.0–100.0)
Monocytes Absolute: 2.5 10*3/uL — ABNORMAL HIGH (ref 0.1–1.0)
Monocytes Relative: 4 %
Neutro Abs: 49.4 10*3/uL — ABNORMAL HIGH (ref 1.7–7.7)
Neutrophils Relative %: 84 %
Platelets: 371 10*3/uL (ref 150–400)
RBC: 3.55 MIL/uL — ABNORMAL LOW (ref 4.22–5.81)
RDW: 18.4 % — ABNORMAL HIGH (ref 11.5–15.5)
WBC: 58.7 10*3/uL (ref 4.0–10.5)
nRBC: 0 % (ref 0.0–0.2)

## 2019-10-09 LAB — MAGNESIUM: Magnesium: 2.2 mg/dL (ref 1.7–2.4)

## 2019-10-09 MED ORDER — SODIUM CHLORIDE 0.9% FLUSH
5.0000 mL | Freq: Three times a day (TID) | INTRAVENOUS | Status: DC
  Administered 2019-10-09 – 2019-10-17 (×15): 5 mL via INTRAVENOUS
  Administered 2019-10-18: 3 mL via INTRAVENOUS
  Administered 2019-10-18 – 2019-10-29 (×30): 5 mL via INTRAVENOUS

## 2019-10-09 MED ORDER — POTASSIUM CHLORIDE 20 MEQ PO PACK
40.0000 meq | PACK | Freq: Three times a day (TID) | ORAL | Status: AC
  Administered 2019-10-09 (×2): 40 meq via ORAL
  Filled 2019-10-09 (×2): qty 2

## 2019-10-09 MED ORDER — SODIUM CHLORIDE 0.9% FLUSH
5.0000 mL | Freq: Three times a day (TID) | INTRAVENOUS | Status: DC
  Administered 2019-10-09: 5 mL via INTRAVENOUS

## 2019-10-09 NOTE — Consult Note (Signed)
[facility]te [facility] Re[facility]   Recent Labs: Potassium (mmol/L)  Date Value  10/09/2019 3.2 (L)   Magnesium (mg/dL)  Date Value  10/09/2019 2.2   Calcium (mg/dL)  Date Value  10/09/2019 8.2 (L)   Albumin (g/dL)  Date Value  10/06/2019 2.4 (L)   Phosphorus (mg/dL)  Date Value  06/09/2019 3.1   Sodium (mmol/L)  Date Value  10/09/2019 137   Corrected Ca: 9.5 mg/dL  Assessment: 84 y.o.malewith medical history significant forlocally-metastatic bladder cancer on chemotherapy, urinary tract obstruction with b/l nephrostomy tubes in place, recurrent UTI, htn, colostomy in place, provoked dvt s/p anticoagulation, who presents with an abscess with sepsis - secondary to locally metastatic bladder cancerconcerning forerosionthrough the anterior abdominal wall.  Goal of Therapy:  Electrolytes WNL  Plan:   continue D51/2NS w/KCl 40 mEq/L at 100 mL/hr  Give an additional 40 mEq oral KCl x 2  BMP, magnesium and phosphorous in am  Dallie Piles ,PharmD Clinical Pharmacist 10/09/2019 7:00 AM

## 2019-10-09 NOTE — Progress Notes (Signed)
Order received from Dr Priscella Mann for flushes and dressing changes to bilateral nephrostomy tubes

## 2019-10-09 NOTE — Progress Notes (Signed)
PROGRESS NOTE    Vincent Poole  DTO:[id number] DOB: [birth year] DOA: 10/05/2019 PCP: [name], MD   Brief Narrative:  Vincent Poole a 84 y.o.malewith medical history significant forlocally-metastatic bladder cancer on chemotherapy, urinary tract obstruction with b/l nephrostomy tubes in place, recurrent UTI, htn, colostomy in place, provoked dvt s/p anticoagulation, who presents with a red "lump" on his lower abdomen in the suprapubic region for several months.  This was noted at recent hospitalization, thought to be 2/2 metastatic bladder cancer. Patient reports that since discharge has had occasional fever,  Lives at home with wife. Mostly bed-bound.  ED Course:manual expression of, per ED provider, approximately 30 cc pus from suprapubic wound. Keflex and metronidazole given. Labs and CT abdomen/pelvis ordered.  11/30: Patient's been seen by general surgery, case discussed with urology, recommending against aggressive invasive procedures as this is secondary to significant bladder cancer.  Dr. [name] also on board indicates patient and family has been resistant to hospice despite dismal prognosis.  12/1: Patients wife at bedside.  Patient apparently transported to radiation therapy this morning.  Endorses tenderness near abd wall abscess site.  Surgery signed off case  12/2: No acute status changes.  No family at bedside.  Patient reports mild improvement in abd pain near abscess site.  Possible plan for radiation therapy today   Assessment & Plan:   Principal Problem:   Intra-abdominal infection Active Problems:   Sepsis ([place])   Essential hypertension   Prostate cancer (HCC)   Urinary obstruction   Malignant neoplasm of urinary bladder ([facility]CC)   Pressure injury of skin   S/P partial resection of colon   Abscess with sepsis  - secondary to locally metastatic bladder cancer concerning for erosion through the anterior abdominal wall.  -Will continue with  current broad spectrum antibiotic regimen (vanco, cefepime, flagyl) though utility of antibiotics at this point is greatly in question -General surgery saw the patient in consultation, discussed case with urology, because of significant tumor burden with cutaneous fistula recommend against aggressive surgical intervention. -Continue local wound care, antibiotics as above -Blood cultures negative to date - Will re-evaluate abscess and laboratory data in AM to assess response   Metastatic bladder cancer  - on padcev, last dose earlier this month per wife, next dose due next month.  -Plan to start radiation therapy on 11/30 but this was canceled secondary to infection - Patient was transported to radiation therapy this morning, wish to continue at this time -Oncology aware, patient and family resistant to hospice, dismal prognosis  Acute on chronic leukocytosis  -Reviewed medical history patient appears to be have a chronic leukocytosis -Of note it was normal approximately 1 year ago, patient is being followed by oncology -Likely worse because of superimposed infection -We will follow-up cultures as above continue antibiotics  bilateral nephrostomy tubes  - 2/2 hx bladder obstruction - ostomy care - cont home myrbetric - Nephrostomy care per nursing  Colostomy - outpt per wife and patient is normal - ostomy care  stage 1 sacral decub POA - wound care  DVT prophylaxis: SCD/Compression stockings  Code Status: DNR    Code Status Orders  (From admission, onward)         Start     Ordered   10/05/19 1925  Do not attempt resuscitation (DNR)  Continuous    Question Answer Comment  In the event of cardiac or respiratory ARREST Do not call a code blue   In the event of cardiac or respiratory  ARREST Do not perform Intubation, CPR, defibrillation or ACLS   In the event of cardiac or respiratory ARREST Use medication by any route, position, wound care, and other measures to  relive pain and suffering. May use oxygen, suction and manual treatment of airway obstruction as needed for comfort.      10/05/19 1924        Code Status History    Date Active Date Inactive Code Status Order ID Comments User Context   09/25/2019 2326 09/28/2019 1718 DNR [id number]  Rise Patience, MD ED   09/25/2019 2206 09/25/2019 2326 DNR [id number]  Merlyn Lot, MD ED   06/07/2019 1651 06/12/2019 1937 DNR [id number]  Basilio Cairo, NP Inpatient   06/07/2019 0103 06/07/2019 1651 Full Code [id number]  Mayer Camel, NP Inpatient   04/26/2019 0420 04/29/2019 1913 Full Code [id number]  Mansy, Arvella Merles, MD ED   04/08/2019 1211 04/10/2019 1628 Full Code [id number]  Gladstone Lighter, MD ED   03/22/2019 2337 03/26/2019 1934 Full Code [id number]  Lance Coon, MD Inpatient   02/21/2019 1505 02/26/2019 1849 Full Code [id number]  Mayo, Pete Pelt, MD Inpatient   11/12/2018 1849 11/16/2018 1632 Full Code [id number]  Epifanio Lesches, MD Inpatient   05/01/2018 1702 05/06/2018 1711 Full Code [id number]  Dustin Flock, MD Inpatient   12/26/2017 2141 12/30/2017 1727 Full Code [id number]  Demetrios Loll, MD Inpatient   05/31/2017 0208 06/01/2017 1922 Full Code [id number]  Hugelmeyer, [place], DO Inpatient   03/04/2017 1833 03/05/2017 1426 Full Code [id number]  Idelle Crouch, MD Inpatient   07/11/2016 0538 07/13/2016 1420 Full Code [id number]  Saundra Shelling, MD ED   Advance Care Planning Activity     Family Communication: Discussed with wife 12/1 answered all questions explained the very poor prognosis and no surgery at this point Disposition Plan:   Patient remained in the hospital for IV antibiotics. Follow-up cultures, expert subspecialty consultation . Patient not medically ready or stable for discharge Consults called:  Admission status: Inpatient   Consultants:   GEN SURGERY- signed off  Procedures:  Ct Head Wo Contrast  Result Date: 09/25/2019 CLINICAL DATA:  Seizure EXAM: CT HEAD WITHOUT CONTRAST  TECHNIQUE: Contiguous axial images were obtained from the base of the skull through the vertex without intravenous contrast. COMPARISON:  None. FINDINGS: Brain: There is atrophy and chronic small vessel disease changes. No acute intracranial abnormality. Specifically, no hemorrhage, hydrocephalus, mass lesion, acute infarction, or significant intracranial injury. Vascular: No hyperdense vessel or unexpected calcification. Skull: No acute calvarial abnormality. Sinuses/Orbits: Visualized paranasal sinuses and mastoids clear. Orbital soft tissues unremarkable. Other: None IMPRESSION: Atrophy, chronic microvascular disease. No acute intracranial abnormality. Electronically Signed   By: Rolm Baptise M.D.   On: 09/25/2019 20:42   Ct Abdomen Pelvis W Contrast  Result Date: 10/05/2019 CLINICAL DATA:  84 year old male with history of lower abdominal pain from an abscess. EXAM: CT ABDOMEN AND PELVIS WITH CONTRAST TECHNIQUE: Multidetector CT imaging of the abdomen and pelvis was performed using the standard protocol following bolus administration of intravenous contrast. CONTRAST:  167mL OMNIPAQUE IOHEXOL 300 MG/ML  SOLN COMPARISON:  CT the abdomen and pelvis 09/25/2019. FINDINGS: Lower chest: Small right pleural effusion lying dependently. Areas of subsegmental atelectasis in the lower lobes of the lungs bilaterally. Atherosclerotic calcifications in the descending thoracic aorta as well as the left anterior descending, left circumflex and right coronary arteries. Hepatobiliary: No suspicious cystic or solid hepatic lesions. No intra or extrahepatic biliary ductal dilatation. Status post cholecystectomy. Pancreas: No pancreatic  mass. No pancreatic ductal dilatation. No pancreatic or peripancreatic fluid collections or inflammatory changes. Spleen: Unremarkable. Adrenals/Urinary Tract: Percutaneous nephrostomy tubes are noted with tips reformed in the renal collecting systems bilaterally. 2.8 cm low-attenuation lesion in  the anterior aspect of the interpolar region of the right kidney, compatible with a simple cyst. Subcentimeter low-attenuation lesion in the anterior aspect of the lower pole the left kidney, too small to characterize, but statistically likely to represent a tiny cyst. No hydroureteronephrosis. Urinary bladder is enlarged measuring approximately 8.6 x 14.5 x 8.8 cm and grossly distorted with extensive multifocal mural thickening and widespread areas of internal soft tissue thickening and enhancing soft tissue, with a combination of fluid and gas within the bladder, concerning for progressively enlarging bladder neoplasm. This appears to extend through the lower anterior abdominal wall where there is a small gas and fluid collection in the subcutaneous fat immediately superficial to the anterior abdominal wall musculature measuring 8.0 x 1.7 x 5.0 cm (axial image 68 of series 2 and sagittal image 64 of series 6). Bilateral adrenal glands are normal in appearance. Stomach/Bowel: Normal appearance of the stomach. No pathologic dilatation of small bowel or colon. A few scattered colonic diverticulae are noted, without surrounding inflammatory changes to suggest an acute diverticulitis at this time. Mid transverse colon loop colostomy. Normal appendix. Vascular/Lymphatic: Aortic atherosclerosis, without evidence of aneurysm or dissection in the abdominal or pelvic vasculature. No lymphadenopathy noted in the abdomen or pelvis. Reproductive: Prostate gland is completely obscured by the adjacent bladder mass, likely from direct invasion. Seminal vesicles are not confidently identified. Other: No significant volume of ascites.  No pneumoperitoneum. Musculoskeletal: There are no aggressive appearing lytic or blastic lesions noted in the visualized portions of the skeleton. IMPRESSION: 1. Progressively enlarging heterogeneously enhancing bladder mass which now appears to have eroded through the inferior aspect of the anterior  abdominal wall musculature, with small superficial abscess just deep to the skin surface in the low anatomic pelvis. 2. Bilateral nephrostomy tubes are stable in position. No hydroureteronephrosis. 3. Small right pleural effusion lying dependently. 4. Aortic atherosclerosis, in addition to least 3 vessel coronary artery disease. 5. Additional incidental findings, as above, similar to prior studies. Electronically Signed   By: Vinnie Langton M.D.   On: 10/05/2019 18:10   Ct Abdomen Pelvis W Contrast  Result Date: 09/25/2019 CLINICAL DATA:  Acute abdominal pain. Fever. EXAM: CT ABDOMEN AND PELVIS WITH CONTRAST TECHNIQUE: Multidetector CT imaging of the abdomen and pelvis was performed using the standard protocol following bolus administration of intravenous contrast. CONTRAST:  172mL OMNIPAQUE IOHEXOL 300 MG/ML  SOLN COMPARISON:  09/09/2019 FINDINGS: Lower chest: Aortic atherosclerosis. Extensive coronary artery calcifications. Heart size is normal. No pericardial effusion. Tiny bilateral pleural effusions with minimal bibasilar atelectasis. Hepatobiliary: No focal liver abnormality is seen. Status post cholecystectomy. No biliary dilatation. Pancreas: Unremarkable. No pancreatic ductal dilatation or surrounding inflammatory changes. Spleen: Normal in size without focal abnormality. Adrenals/Urinary Tract: Adrenal glands are normal. Bilateral nephrostomy tubes in place. Slight dilatation of the right renal pelvis and proximal right ureter. 21 mm simple cyst on the mid right kidney. Again noted is a large irregular inhomogeneous mass in the bladder which appears larger than on the prior study. There are irregular fluid collections containing gas in that region consistent with infection. Gas collection protrudes through the midline of the anterior abdominal wall as demonstrated on the prior exam. Stomach/Bowel: Large amount of stool in the rectum consistent with a fecal impaction. Colostomy in the  mid transverse  colon. Appendix is normal. No significant change since the prior study. Vascular/Lymphatic: Aortic atherosclerosis. No enlarged abdominal or pelvic lymph nodes. Reproductive: Prostate gland is not identified. Other: No ascites. Musculoskeletal: No acute abnormality. Severe arthritic changes of both hips, right greater than left. IMPRESSION: 1. Interval enlargement of the large irregular inhomogeneous mass in the bladder consistent with the patient's known bladder carcinoma. 2. Large amount of stool in the rectum consistent with fecal impaction, unchanged. 3. Bilateral nephrostomy tubes in place. Slight dilatation of the right renal pelvis and proximal right ureter. 4. Tiny bilateral pleural effusions with minimal bibasilar atelectasis. 5. Severe arthritic changes of both hips, right greater than left. 6. Aortic Atherosclerosis (ICD10-I70.0). Electronically Signed   By: Lorriane Shire M.D.   On: 09/25/2019 20:47   Ct Abdomen Pelvis W Contrast  Result Date: 09/09/2019 CLINICAL DATA:  Restaging prostate cancer and urothelial carcinoma diagnosed 1 year ago. History of bilateral nephrostomies, partial colon resection and cholecystectomy. Reported area of erythema and swelling in the abdominal wall. EXAM: CT ABDOMEN AND PELVIS WITH CONTRAST TECHNIQUE: Multidetector CT imaging of the abdomen and pelvis was performed using the standard protocol following bolus administration of intravenous contrast. CONTRAST:  186mL OMNIPAQUE IOHEXOL 300 MG/ML  SOLN COMPARISON:  CT 08/08/2019 and 06/06/2019. FINDINGS: Lower chest: Stable mild emphysematous changes and scarring at both lung bases. The small right pleural effusion noted previously has resolved. Hepatobiliary: The liver is normal in density without suspicious focal abnormality. No significant biliary dilatation post cholecystectomy. Pancreas: Unremarkable. No pancreatic ductal dilatation or surrounding inflammatory changes. Spleen: Normal in size without focal abnormality.  Adrenals/Urinary Tract: The adrenal glands appear stable without suspicious findings. Bilateral percutaneous nephrostomies remain in place. There is bilateral renal cortical thinning with contrast excretion into the renal collecting systems on the delayed images. There is a stable cyst anteriorly in the mid right kidney. No hydronephrosis. Grossly abnormal urinary bladder again noted with irregular bladder wall thickening and enhancement. There are solid enhancing components measuring 4.5 x 2.9 cm on image 69/2 and 4.5 x 3.0 cm on image 72/2. There are internal air-fluid levels within the bladder lumen with an enlarging diverticular like structure herniating between the rectus abdominus muscles. This now measures up to 7.7 x 7.0 cm on image 65/2 (previously 4.9 x 5.2 cm). Stomach/Bowel: No evidence of bowel wall thickening, distention or surrounding inflammatory change. There is a distal transverse colostomy. The appendix appears normal. A large amount of stool is present in the rectum. Vascular/Lymphatic: There are no enlarged abdominal or pelvic lymph nodes. Aortic and branch vessel atherosclerosis without acute vascular findings. Reproductive: Previous trans urethral resection of the prostate gland. Irregular enhancing nodularity along the bladder base, similar to previous study. Other: Diffuse soft tissue stranding and ill-defined fat planes along the pelvic sidewalls, similar to previous study. No ascites or free air. No focal inflammatory changes are identified within the anterior abdominal wall related to the colostomy or enlarging diverticulum projecting anteriorly from the bladder through the lower anterior abdominal wall. Musculoskeletal: No acute osseous findings or evidence of bone destruction. There are degenerative changes throughout the spine with ankylosis of the L1 through L3 vertebral bodies. There are moderately advanced degenerative changes at both hip status post proximal right femoral ORIF.  IMPRESSION: 1. Persistent abnormal appearance of the urinary bladder status post trans urethral resection of the prostate gland. There is persistent irregular enhancing nodularity along the bladder base, suspicious for residual/recurrent bladder cancer. 2. Enlarging diverticular-like structure extending anteriorly  from the bladder and herniating between the rectus abdominus muscles and the lower anterior abdominal wall. Again, this could be related to tumor and bladder outlet obstruction versus tissue necrosis/infection. 3. No evidence of distant metastatic disease. 4. Bilateral percutaneous nephrostomies remain in place. No hydronephrosis. 5. Aortic Atherosclerosis (ICD10-I70.0). Electronically Signed   By: Richardean Sale M.D.   On: 09/09/2019 13:29   Dg Chest Portable 1 View  Result Date: 09/25/2019 CLINICAL DATA:  Sepsis, mental status decline. EXAM: PORTABLE CHEST 1 VIEW COMPARISON:  Chest CT 08/08/2019 FINDINGS: Power injectable right Port-A-Cath noted, tip projecting over the SVC. Mild bilateral interstitial accentuation in both lungs with some hazy density in the infrahilar regions potentially from atypical pneumonia or noncardiogenic edema. No cardiomegaly. Atherosclerotic calcification of the aortic arch. No blunting of the costophrenic angles. IMPRESSION: 1. Hazy infrahilar airspace opacity in the lungs bilaterally, potentially from atypical pneumonia or noncardiogenic edema. 2. Atherosclerotic calcification of the aortic arch. 3. Power injectable right Port-A-Cath tip: SVC. Electronically Signed   By: Van Clines M.D.   On: 09/25/2019 20:19   Ir Nephrostomy Exchange Left  Result Date: 09/13/2019 CLINICAL DATA:  Bladder carcinoma. Long term indwelling bilateral nephrostomy catheters, presents for scheduled exchange. No current problems with drain catheters. EXAM: BILATERAL PERCUTANEOUS NEPHROSTOMY CATHETER EXCHANGE UNDER FLUOROSCOPY TECHNIQUE: The procedure, risks (including but not  limited to bleeding, infection, organ damage ), benefits, and alternatives were explained to the patient and spouse. Questions regarding the procedure were encouraged and answered. The patient understands and consents to the procedure. The nephrostomy tubes and surrounding skin were prepped with Betadine, draped in usual sterile fashion. 1% lidocaine subcutaneous placed around both skin entry sites. A small amount of contrast was injected through the left nephrostomy catheter to opacify the renal collecting system. The catheter was cut and exchanged over a 0.035" angiographic wire for a new 12 pigtail catheter, formed centrally within the collecting system under fluoroscopy. Contrast injection confirms appropriate positioning. In a similar fashion, the right nephrostomy catheter was injected, cut, and exchanged for a new 12 pigtail catheter, formed centrally within the right renal collecting system. Injection confirms appropriate positioning and patency. Both catheters were secured externally with a Statlock devices and 0 Prolene sutures. The patient tolerated the procedure well. FLUOROSCOPY TIME:  1.2 minute; 4 5 uGym2 DAP COMPLICATIONS: None immediate IMPRESSION: 1. Technically successful exchange of bilateral nephrostomy catheters under fluoroscopy Electronically Signed   By: Lucrezia Europe M.D.   On: 09/13/2019 09:50   Ir Nephrostomy Exchange Right  Result Date: 09/13/2019 CLINICAL DATA:  Bladder carcinoma. Long term indwelling bilateral nephrostomy catheters, presents for scheduled exchange. No current problems with drain catheters. EXAM: BILATERAL PERCUTANEOUS NEPHROSTOMY CATHETER EXCHANGE UNDER FLUOROSCOPY TECHNIQUE: The procedure, risks (including but not limited to bleeding, infection, organ damage ), benefits, and alternatives were explained to the patient and spouse. Questions regarding the procedure were encouraged and answered. The patient understands and consents to the procedure. The nephrostomy tubes  and surrounding skin were prepped with Betadine, draped in usual sterile fashion. 1% lidocaine subcutaneous placed around both skin entry sites. A small amount of contrast was injected through the left nephrostomy catheter to opacify the renal collecting system. The catheter was cut and exchanged over a 0.035" angiographic wire for a new 12 pigtail catheter, formed centrally within the collecting system under fluoroscopy. Contrast injection confirms appropriate positioning. In a similar fashion, the right nephrostomy catheter was injected, cut, and exchanged for a new 12 pigtail catheter, formed centrally within the  right renal collecting system. Injection confirms appropriate positioning and patency. Both catheters were secured externally with a Statlock devices and 0 Prolene sutures. The patient tolerated the procedure well. FLUOROSCOPY TIME:  1.2 minute; 4 5 uGym2 DAP COMPLICATIONS: None immediate IMPRESSION: 1. Technically successful exchange of bilateral nephrostomy catheters under fluoroscopy Electronically Signed   By: Lucrezia Europe M.D.   On: 09/13/2019 09:50    Antimicrobials:   Vancomycin, cefepime and Flagyl day 4   Subjective: No acute events overnight Mildly improved appetite per patient Improved pain control near lower abdomen  Objective: Vitals:   10/08/19 1100 10/08/19 2110 10/09/19 0613 10/09/19 1138  BP: 109/60 (!) 106/53 110/60 107/62  Pulse: 92 99 98 (!) 102  Resp: (!) 24 16 18 15   Temp: 98.1 F (36.7 C)  97.9 F (36.6 C) 98.1 F (36.7 C)  TempSrc: Axillary  Oral Oral  SpO2: 97% 96% 94% 98%  Weight:      Height:        Intake/Output Summary (Last 24 hours) at 10/09/2019 1202 Last data filed at 10/09/2019 6967 Gross per 24 hour  Intake 3051.27 ml  Output 1835 ml  Net 1216.27 ml   Filed Weights   10/05/19 1418 10/05/19 2139  Weight: 65.8 kg 66.6 kg    Examination:  General exam: Appears calm and comfortable  Respiratory system: Clear to auscultation.  Respiratory effort normal. Cardiovascular system: S1 & S2 heard, RRR. No JVD, murmurs, rubs, gallops or clicks. No pedal edema. Gastrointestinal system: Abdomen with colostomy bag no output no signs of infection, post I&D wound on abdominal wall no evidence of bleeding or exuding purulence through dressing at this point time, did not take dressing down Central nervous system: Alert,  Extremities: Warm well perfused  skin: Lesions abdominal wall as above otherwise no rashes, lesions or ulcers Psychiatry: Judgement and insight are poor. Mood & affect flat    Data Reviewed: I have personally reviewed following labs and imaging studies  CBC: Recent Labs  Lab 10/05/19 1427 10/06/19 0504 10/07/19 0625 10/08/19 0637 10/09/19 0406  WBC 63.7* 51.9* 49.5* 61.2* 58.7*  NEUTROABS 51.9*  --  41.1* 51.5* 49.4*  HGB 11.9* 10.4* 9.6* 10.2* 10.0*  HCT 36.4* 31.8* 29.6* 31.6* 31.6*  MCV 85.0 86.4 87.3 85.9 89.0  PLT 293 329 358 380 893   Basic Metabolic Panel: Recent Labs  Lab 10/05/19 1427 10/06/19 0504 10/07/19 0625 10/08/19 0637 10/09/19 0406  NA 133* 137 139 136 137  K 3.8 3.4* 3.0* 2.9* 3.2*  CL 100 106 108 109 109  CO2 21* 22 20* 17* 21*  GLUCOSE 133* 118* 100* 122* 119*  BUN 20 17 13 10 10   CREATININE 0.92 0.86 0.88 0.82 0.85  CALCIUM 8.5* 8.4* 8.2* 8.1* 8.2*  MG  --   --   --  1.7 2.2   GFR: Estimated Creatinine Clearance: 63.1 mL/min (by C-G formula based on SCr of 0.85 mg/dL). Liver Function Tests: Recent Labs  Lab 10/05/19 1427 10/06/19 0504  AST 13* 10*  ALT 9 7  ALKPHOS 130* 125  BILITOT 0.6 0.6  PROT 6.7 5.7*  ALBUMIN 2.9* 2.4*   No results for input(s): LIPASE, AMYLASE in the last 168 hours. No results for input(s): AMMONIA in the last 168 hours. Coagulation Profile: No results for input(s): INR, PROTIME in the last 168 hours. Cardiac Enzymes: No results for input(s): CKTOTAL, CKMB, CKMBINDEX, TROPONINI in the last 168 hours. BNP (last 3 results) No  results for input(s): PROBNP in  the last 8760 hours. HbA1C: No results for input(s): HGBA1C in the last 72 hours. CBG: No results for input(s): GLUCAP in the last 168 hours. Lipid Profile: No results for input(s): CHOL, HDL, LDLCALC, TRIG, CHOLHDL, LDLDIRECT in the last 72 hours. Thyroid Function Tests: No results for input(s): TSH, T4TOTAL, FREET4, T3FREE, THYROIDAB in the last 72 hours. Anemia Panel: No results for input(s): VITAMINB12, FOLATE, FERRITIN, TIBC, IRON, RETICCTPCT in the last 72 hours. Sepsis Labs: Recent Labs  Lab 10/05/19 1427 10/05/19 1619  LATICACIDVEN 2.7* 1.4    Recent Results (from the past 240 hour(s))  Blood Culture (routine x 2)     Status: None (Preliminary result)   Collection Time: 10/05/19  4:18 PM   Specimen: BLOOD  Result Value Ref Range Status   Specimen Description BLOOD BLOOD RIGHT WRIST  Final   Special Requests   Final    BOTTLES DRAWN AEROBIC AND ANAEROBIC Blood Culture adequate volume   Culture   Final    NO GROWTH 3 DAYS Performed at [facility], [address], [place], [place] [address]    Report Status PENDING  Incomplete  Blood Culture (routine x 2)     Status: None (Preliminary result)   Collection Time: 10/05/19  4:18 PM   Specimen: BLOOD  Result Value Ref Range Status   Specimen Description BLOOD BLOOD LEFT WRIST  Final   Special Requests   Final    BOTTLES DRAWN AEROBIC AND ANAEROBIC Blood Culture results may not be optimal due to an inadequate volume of blood received in culture bottles   Culture   Final    NO GROWTH 3 DAYS Performed at [facility], [address], [place], [place] [address]    Report Status PENDING  Incomplete  Urine culture     Status: Abnormal   Collection Time: 10/05/19  4:18 PM   Specimen: In/Out Cath Urine  Result Value Ref Range Status   Specimen Description   Final    IN/OUT CATH URINE Performed at [facility], [address], [place], [place] [address]     Special Requests   Final    NONE Performed at [facility], [address] [place], [place] [address]    Culture MULTIPLE SPECIES PRESENT, SUGGEST RECOLLECTION (A)  Final   Report Status 10/07/2019 FINAL  Final  SARS CORONAVIRUS 2 (TAT 6-24 HRS) Nasopharyngeal Nasopharyngeal Swab     Status: None   Collection Time: 10/05/19  4:18 PM   Specimen: Nasopharyngeal Swab  Result Value Ref Range Status   SARS Coronavirus 2 NEGATIVE NEGATIVE Final    Comment: (NOTE) SARS-CoV-2 target nucleic acids are NOT DETECTED. The SARS-CoV-2 RNA is generally detectable in upper and lower respiratory specimens during the acute phase of infection. Negative results do not preclude SARS-CoV-2 infection, do not rule out co-infections with other pathogens, and should not be used as the sole basis for treatment or other patient management decisions. Negative results must be combined with clinical observations, patient history, and epidemiological information. The expected result is Negative. Fact Sheet for Patients: SugarRoll.be Fact Sheet for Healthcare Providers: https://www.woods-mathews.com/ This test is not yet approved or cleared by the [place] FDA and  has been authorized for detection and/or diagnosis of SARS-CoV-2 by FDA under an Emergency Use Authorization (EUA). This EUA will remain  in effect (meaning this test can be used) for the duration of the COVID-19 declaration under Section 56 4(b)(1) of the Act, 21 U.S.C. section 360bbb-3(b)(1), unless the  authorization is terminated or revoked sooner. Performed at [facility], [place] [address] [place], [place] [address]          Radiology Studies: No results found.      Scheduled Meds:  mirabegron ER  50 mg Oral Daily   potassium chloride  40 mEq Oral TID   sodium chloride flush  5 mL Intravenous Q8H   Continuous Infusions:  sodium chloride 250 mL (10/09/19 0843)    ceFEPime (MAXIPIME) IV 2 g (10/09/19 1010)   dextrose 5 % and 0.45 % NaCl with KCl 40 mEq/L 100 mL/hr at 10/09/19 0902   metronidazole 100 mL/hr at 10/09/19 0902   vancomycin 1,500 mg (10/08/19 2126)     LOS: 4 days    Time spent: 35 min    Sidney Ace, MD [facility]  If 7PM-7AM, please contact night-coverage  10/09/2019, 12:02 PM

## 2019-10-10 ENCOUNTER — Ambulatory Visit
Admission: RE | Admit: 2019-10-10 | Discharge: 2019-10-10 | Disposition: A | Discharge status: Home / Self Care | Payer: Medicare Other | Source: Ambulatory Visit | Attending: Radiation Oncology | Admitting: Radiation Oncology

## 2019-10-10 DIAGNOSIS — K651 Peritoneal abscess: Secondary | ICD-10-CM

## 2019-10-10 LAB — CULTURE, BLOOD (ROUTINE X 2)
Culture: NO GROWTH
Culture: NO GROWTH
Special Requests: ADEQUATE

## 2019-10-10 LAB — CBC WITH DIFFERENTIAL/PLATELET
Abs Immature Granulocytes: 2.95 10*3/uL — ABNORMAL HIGH (ref 0.00–0.07)
Basophils Absolute: 0.1 10*3/uL (ref 0.0–0.1)
Basophils Relative: 0 %
Eosinophils Absolute: 0.9 10*3/uL — ABNORMAL HIGH (ref 0.0–0.5)
Eosinophils Relative: 1 %
HCT: 33.7 % — ABNORMAL LOW (ref 39.0–52.0)
Hemoglobin: 10.3 g/dL — ABNORMAL LOW (ref 13.0–17.0)
Immature Granulocytes: 5 %
Lymphocytes Relative: 5 %
Lymphs Abs: 3.4 10*3/uL (ref 0.7–4.0)
MCH: 27.8 pg (ref 26.0–34.0)
MCHC: 30.6 g/dL (ref 30.0–36.0)
MCV: 91.1 fL (ref 80.0–100.0)
Monocytes Absolute: 2.9 10*3/uL — ABNORMAL HIGH (ref 0.1–1.0)
Monocytes Relative: 5 %
Neutro Abs: 53.9 10*3/uL — ABNORMAL HIGH (ref 1.7–7.7)
Neutrophils Relative %: 84 %
Platelets: 402 10*3/uL — ABNORMAL HIGH (ref 150–400)
RBC: 3.7 MIL/uL — ABNORMAL LOW (ref 4.22–5.81)
RDW: 18.9 % — ABNORMAL HIGH (ref 11.5–15.5)
Smear Review: NORMAL
WBC: 64.1 10*3/uL (ref 4.0–10.5)
nRBC: 0 % (ref 0.0–0.2)

## 2019-10-10 LAB — BASIC METABOLIC PANEL
Anion gap: 8 (ref 5–15)
BUN: 9 mg/dL (ref 8–23)
CO2: 19 mmol/L — ABNORMAL LOW (ref 22–32)
Calcium: 8.3 mg/dL — ABNORMAL LOW (ref 8.9–10.3)
Chloride: 110 mmol/L (ref 98–111)
Creatinine, Ser: 1.02 mg/dL (ref 0.61–1.24)
GFR calc Af Amer: >60 mL/min (ref >60)
GFR calc non Af Amer: >60 mL/min (ref >60)
Glucose, Bld: 108 mg/dL — ABNORMAL HIGH (ref 70–99)
Potassium: 4.6 mmol/L (ref 3.5–5.1)
Sodium: 137 mmol/L (ref 135–145)

## 2019-10-10 LAB — PHOSPHORUS: Phosphorus: 2.5 mg/dL (ref 2.5–4.6)

## 2019-10-10 LAB — MAGNESIUM: Magnesium: 2 mg/dL (ref 1.7–2.4)

## 2019-10-10 MED ORDER — DEXTROSE-NACL 5-0.45 % IV SOLN
INTRAVENOUS | Status: DC
  Administered 2019-10-10 – 2019-10-17 (×10): via INTRAVENOUS

## 2019-10-10 MED ORDER — SODIUM CHLORIDE 0.9 % IV SOLN
2.0000 g | Freq: Two times a day (BID) | INTRAVENOUS | Status: DC
  Administered 2019-10-10 – 2019-10-16 (×13): 2 g via INTRAVENOUS
  Filled 2019-10-10 (×15): qty 2

## 2019-10-10 MED ORDER — VANCOMYCIN HCL 1.25 G IV SOLR
1250.0000 mg | INTRAVENOUS | Status: DC
  Administered 2019-10-10 – 2019-10-12 (×3): 1250 mg via INTRAVENOUS
  Filled 2019-10-10 (×4): qty 1250

## 2019-10-10 NOTE — Consult Note (Signed)
Pharmacy Antibiotic Note  Vincent Poole is a 84 y.o. male admitted on 10/05/2019 with pneumonia and intra-abdominal infection.  Pharmacy was consulted for Vancomycin and Cefepime dosing. Patient suffering from drainage from abdomen. 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 This is day # 6 of IV antibiotics, with continued profound leukocytosis that has not improved although without any recent fevers. His SCr has increased somewhat today.  Plan: 1) change vancomycin dose to 1250 mg IV Q 24 hrs Goal AUC 400-550 Expected AUC: 482 Expected Css: 35.8/10.7 mcg/mL T1/2: 12.9h SCr used: 1.02  2) adjust Cefepime to 2g IV Q8 hours  Height: 5\' 11"  (180.3 cm) Weight: 146 lb 13.2 oz (66.6 kg) IBW/kg (Calculated) : 75.3  Temp (24hrs), Avg:98 F (36.7 C), Min:97.7 F (36.5 C), Max:98.3 F (36.8 C)  Recent Labs  Lab 10/05/19 1427 10/05/19 1619 10/06/19 0504 10/07/19 0625 10/08/19 0637 10/09/19 0406 10/10/19 0623  WBC 63.7*  --  51.9* 49.5* 61.2* 58.7* PENDING  CREATININE 0.92  --  0.86 0.88 0.82 0.85 1.02  LATICACIDVEN 2.7* 1.4  --   --   --   --   --     Estimated Creatinine Clearance: 52.6 mL/min (by C-G formula based on SCr of 1.02 mg/dL).    No Known Allergies  Antimicrobials this admission: Vancomycin 11/28 >>  Cefepime 11/28 >>  Metronidazole 11/28 >> Rocephin 11/28 x1  Microbiology results: 11/28 BCx: NGF 11/28 UCx: contaminated 11/28 SARS CoV-2 negative  Thank you for allowing pharmacy to be a part of this patient's care.  Dallie Piles 10/10/2019 7:36 AM

## 2019-10-10 NOTE — Progress Notes (Signed)
PROGRESS NOTE    Vincent Poole  QHU:[id number] DOB: [birth year] DOA: 10/05/2019 PCP: [name], MD   Brief Narrative:  Vincent Poole a 84 y.o.malewith medical history significant forlocally-metastatic bladder cancer on chemotherapy, urinary tract obstruction with b/l nephrostomy tubes in place, recurrent UTI, htn, colostomy in place, provoked dvt s/p anticoagulation, who presents with a red "lump" on his lower abdomen in the suprapubic region for several months.  This was noted at recent hospitalization, thought to be 2/2 metastatic bladder cancer. Patient reports that since discharge has had occasional fever,  Lives at home with wife. Mostly bed-bound.  ED Course:manual expression of, per ED provider, approximately 30 cc pus from suprapubic wound. Keflex and metronidazole given. Labs and CT abdomen/pelvis ordered.  11/30: Patient's been seen by general surgery, case discussed with urology, recommending against aggressive invasive procedures as this is secondary to significant bladder cancer.  Dr. [name] also on board indicates patient and family has been resistant to hospice despite dismal prognosis.  12/1: Patients wife at bedside.  Patient apparently transported to radiation therapy this morning.  Endorses tenderness near abd wall abscess site.  Surgery signed off case  12/2: No acute status changes.  No family at bedside.  Patient reports mild improvement in abd pain near abscess site.  Possible plan for radiation therapy today  12/3: No family present at bedside.  Spoke at length with daughter at bedside yesterday.  Explained that there was no clear end point for the antibiotics and their efficacy in this case was greatly in question.  Offered palliative care consult, daughter will discuss with family and come to decision.  Patient overall stable, getting transported daily to radiation treatments.  Assessment & Plan:   Principal Problem:   Intra-abdominal  infection Active Problems:   Sepsis ([place])   Essential hypertension   Prostate cancer ([place])   Urinary obstruction   Malignant neoplasm of urinary bladder ([facility])   Pressure injury of skin   S/P partial resection of colon   Abscess with sepsis  - secondary to locally metastatic bladder cancer concerning for erosion through the anterior abdominal wall.  -Will continue with current broad spectrum antibiotic regimen (vanco, cefepime, flagyl) though utility of antibiotics at this point is greatly in question.  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 -Continue local wound care, antibiotics as above -Blood cultures negative to date - WBC remains high and trending up - Continue to recommend palliative care involvement, family will discuss and let me know   Metastatic bladder cancer  - on padcev, last dose earlier this month per wife, next dose due next month.  -Plan to start radiation therapy on 11/30 but this was canceled secondary to infection - Patient was transported to radiation therapy this morning, wish to continue at this time -Oncology aware, patient and family resistant to hospice, poor prognosis.  Recommend palliative care consultation  Acute on chronic leukocytosis  -Reviewed medical history patient appears to be have a chronic leukocytosis -Of note it was normal approximately 1 year ago, patient is being followed by oncology -Likely worse because of superimposed infection -We will follow-up cultures as above continue antibiotics  bilateral nephrostomy tubes  - 2/2 hx bladder obstruction - ostomy care - cont home myrbetric - Nephrostomy care per nursing  Colostomy - outpt per wife and patient is normal -  ostomy care  stage 1 sacral decub POA - wound  care  DVT prophylaxis: SCD/Compression stockings  Code Status: DNR    Code Status Orders  (From admission, onward)         Start     Ordered   10/05/19 1925  Do not attempt resuscitation (DNR)  Continuous    Question Answer Comment  In the event of cardiac or respiratory ARREST Do not call a code blue   In the event of cardiac or respiratory ARREST Do not perform Intubation, CPR, defibrillation or ACLS   In the event of cardiac or respiratory ARREST Use medication by any route, position, wound care, and other measures to relive pain and suffering. May use oxygen, suction and manual treatment of airway obstruction as needed for comfort.      10/05/19 1924        Code Status History    Date Active Date Inactive Code Status Order ID Comments User Context   09/25/2019 2326 09/28/2019 1718 DNR [id number]  Rise Patience, MD ED   09/25/2019 2206 09/25/2019 2326 DNR [id number]  Merlyn Lot, MD ED   06/07/2019 1651 06/12/2019 1937 DNR [id number]  Basilio Cairo, NP Inpatient   06/07/2019 0103 06/07/2019 1651 Full Code [id number]  Mayer Camel, NP Inpatient   04/26/2019 0420 04/29/2019 1913 Full Code [id number]  Mansy, Arvella Merles, MD ED   04/08/2019 1211 04/10/2019 1628 Full Code [id number]  Gladstone Lighter, MD ED   03/22/2019 2337 03/26/2019 1934 Full Code [id number]  Lance Coon, MD Inpatient   02/21/2019 1505 02/26/2019 1849 Full Code [id number]  Mayo, Pete Pelt, MD Inpatient   11/12/2018 1849 11/16/2018 1632 Full Code [id number]  Epifanio Lesches, MD Inpatient   05/01/2018 1702 05/06/2018 1711 Full Code [id number]  Dustin Flock, MD Inpatient   12/26/2017 2141 12/30/2017 1727 Full Code [id number]  Demetrios Loll, MD Inpatient   05/31/2017 0208 06/01/2017 1922 Full Code [id number]  Hugelmeyer, Blakeslee, DO Inpatient   03/04/2017 1833 03/05/2017 1426 Full Code [id number]  Idelle Crouch, MD Inpatient   07/11/2016 0538 07/13/2016 1420 Full Code [id number]  Saundra Shelling, MD ED   Advance Care Planning Activity      Family Communication: Discussed with wife 12/1 answered all questions explained the very poor prognosis and no surgery at this point Disposition Plan:   Patient remained in the hospital for IV antibiotics. Follow-up cultures, expert subspecialty consultation . Patient not medically ready or stable for discharge Consults called:  Admission status: Inpatient   Consultants:   GEN SURGERY- signed off  Procedures:  Ct Head Wo Contrast  Result Date: 09/25/2019 CLINICAL DATA:  Seizure EXAM: CT HEAD WITHOUT CONTRAST TECHNIQUE: Contiguous axial images were obtained from the base of the skull through the vertex without intravenous contrast. COMPARISON:  None. FINDINGS: Brain: There is atrophy and chronic small vessel disease changes. No acute intracranial abnormality. Specifically, no hemorrhage, hydrocephalus, mass lesion, acute infarction, or significant intracranial injury. Vascular: No hyperdense vessel or unexpected calcification. Skull: No acute calvarial abnormality. Sinuses/Orbits: Visualized paranasal sinuses and mastoids clear. Orbital soft tissues unremarkable. Other: None IMPRESSION: Atrophy, chronic microvascular disease. No acute intracranial abnormality. Electronically Signed   By: Rolm Baptise M.D.   On: 09/25/2019 20:42   Ct Abdomen Pelvis W Contrast  Result Date: 10/05/2019 CLINICAL DATA:  84 year old male with history of lower abdominal pain from an abscess. EXAM: CT ABDOMEN AND PELVIS WITH CONTRAST TECHNIQUE: Multidetector CT imaging of the abdomen and pelvis was performed using the standard  protocol following bolus administration of intravenous contrast. CONTRAST:  128mL OMNIPAQUE IOHEXOL 300 MG/ML  SOLN COMPARISON:  CT the abdomen and pelvis 09/25/2019. FINDINGS: Lower chest: Small right pleural effusion lying dependently. Areas of subsegmental atelectasis in the lower lobes of the lungs bilaterally. Atherosclerotic calcifications in the descending thoracic aorta as well as the  left anterior descending, left circumflex and right coronary arteries. Hepatobiliary: No suspicious cystic or solid hepatic lesions. No intra or extrahepatic biliary ductal dilatation. Status post cholecystectomy. Pancreas: No pancreatic mass. No pancreatic ductal dilatation. No pancreatic or peripancreatic fluid collections or inflammatory changes. Spleen: Unremarkable. Adrenals/Urinary Tract: Percutaneous nephrostomy tubes are noted with tips reformed in the renal collecting systems bilaterally. 2.8 cm low-attenuation lesion in the anterior aspect of the interpolar region of the right kidney, compatible with a simple cyst. Subcentimeter low-attenuation lesion in the anterior aspect of the lower pole the left kidney, too small to characterize, but statistically likely to represent a tiny cyst. No hydroureteronephrosis. Urinary bladder is enlarged measuring approximately 8.6 x 14.5 x 8.8 cm and grossly distorted with extensive multifocal mural thickening and widespread areas of internal soft tissue thickening and enhancing soft tissue, with a combination of fluid and gas within the bladder, concerning for progressively enlarging bladder neoplasm. This appears to extend through the lower anterior abdominal wall where there is a small gas and fluid collection in the subcutaneous fat immediately superficial to the anterior abdominal wall musculature measuring 8.0 x 1.7 x 5.0 cm (axial image 68 of series 2 and sagittal image 64 of series 6). Bilateral adrenal glands are normal in appearance. Stomach/Bowel: Normal appearance of the stomach. No pathologic dilatation of small bowel or colon. A few scattered colonic diverticulae are noted, without surrounding inflammatory changes to suggest an acute diverticulitis at this time. Mid transverse colon loop colostomy. Normal appendix. Vascular/Lymphatic: Aortic atherosclerosis, without evidence of aneurysm or dissection in the abdominal or pelvic vasculature. No lymphadenopathy  noted in the abdomen or pelvis. Reproductive: Prostate gland is completely obscured by the adjacent bladder mass, likely from direct invasion. Seminal vesicles are not confidently identified. Other: No significant volume of ascites.  No pneumoperitoneum. Musculoskeletal: There are no aggressive appearing lytic or blastic lesions noted in the visualized portions of the skeleton. IMPRESSION: 1. Progressively enlarging heterogeneously enhancing bladder mass which now appears to have eroded through the inferior aspect of the anterior abdominal wall musculature, with small superficial abscess just deep to the skin surface in the low anatomic pelvis. 2. Bilateral nephrostomy tubes are stable in position. No hydroureteronephrosis. 3. Small right pleural effusion lying dependently. 4. Aortic atherosclerosis, in addition to least 3 vessel coronary artery disease. 5. Additional incidental findings, as above, similar to prior studies. Electronically Signed   By: Vinnie Langton M.D.   On: 10/05/2019 18:10   Ct Abdomen Pelvis W Contrast  Result Date: 09/25/2019 CLINICAL DATA:  Acute abdominal pain. Fever. EXAM: CT ABDOMEN AND PELVIS WITH CONTRAST TECHNIQUE: Multidetector CT imaging of the abdomen and pelvis was performed using the standard protocol following bolus administration of intravenous contrast. CONTRAST:  173mL OMNIPAQUE IOHEXOL 300 MG/ML  SOLN COMPARISON:  09/09/2019 FINDINGS: Lower chest: Aortic atherosclerosis. Extensive coronary artery calcifications. Heart size is normal. No pericardial effusion. Tiny bilateral pleural effusions with minimal bibasilar atelectasis. Hepatobiliary: No focal liver abnormality is seen. Status post cholecystectomy. No biliary dilatation. Pancreas: Unremarkable. No pancreatic ductal dilatation or surrounding inflammatory changes. Spleen: Normal in size without focal abnormality. Adrenals/Urinary Tract: Adrenal glands are normal. Bilateral nephrostomy tubes in  place. Slight  dilatation of the right renal pelvis and proximal right ureter. 21 mm simple cyst on the mid right kidney. Again noted is a large irregular inhomogeneous mass in the bladder which appears larger than on the prior study. There are irregular fluid collections containing gas in that region consistent with infection. Gas collection protrudes through the midline of the anterior abdominal wall as demonstrated on the prior exam. Stomach/Bowel: Large amount of stool in the rectum consistent with a fecal impaction. Colostomy in the mid transverse colon. Appendix is normal. No significant change since the prior study. Vascular/Lymphatic: Aortic atherosclerosis. No enlarged abdominal or pelvic lymph nodes. Reproductive: Prostate gland is not identified. Other: No ascites. Musculoskeletal: No acute abnormality. Severe arthritic changes of both hips, right greater than left. IMPRESSION: 1. Interval enlargement of the large irregular inhomogeneous mass in the bladder consistent with the patient's known bladder carcinoma. 2. Large amount of stool in the rectum consistent with fecal impaction, unchanged. 3. Bilateral nephrostomy tubes in place. Slight dilatation of the right renal pelvis and proximal right ureter. 4. Tiny bilateral pleural effusions with minimal bibasilar atelectasis. 5. Severe arthritic changes of both hips, right greater than left. 6. Aortic Atherosclerosis (ICD10-I70.0). Electronically Signed   By: Lorriane Shire M.D.   On: 09/25/2019 20:47   Dg Chest Portable 1 View  Result Date: 09/25/2019 CLINICAL DATA:  Sepsis, mental status decline. EXAM: PORTABLE CHEST 1 VIEW COMPARISON:  Chest CT 08/08/2019 FINDINGS: Power injectable right Port-A-Cath noted, tip projecting over the SVC. Mild bilateral interstitial accentuation in both lungs with some hazy density in the infrahilar regions potentially from atypical pneumonia or noncardiogenic edema. No cardiomegaly. Atherosclerotic calcification of the aortic arch. No  blunting of the costophrenic angles. IMPRESSION: 1. Hazy infrahilar airspace opacity in the lungs bilaterally, potentially from atypical pneumonia or noncardiogenic edema. 2. Atherosclerotic calcification of the aortic arch. 3. Power injectable right Port-A-Cath tip: SVC. Electronically Signed   By: Van Clines M.D.   On: 09/25/2019 20:19   Ir Nephrostomy Exchange Left  Result Date: 09/13/2019 CLINICAL DATA:  Bladder carcinoma. Long term indwelling bilateral nephrostomy catheters, presents for scheduled exchange. No current problems with drain catheters. EXAM: BILATERAL PERCUTANEOUS NEPHROSTOMY CATHETER EXCHANGE UNDER FLUOROSCOPY TECHNIQUE: The procedure, risks (including but not limited to bleeding, infection, organ damage ), benefits, and alternatives were explained to the patient and spouse. Questions regarding the procedure were encouraged and answered. The patient understands and consents to the procedure. The nephrostomy tubes and surrounding skin were prepped with Betadine, draped in usual sterile fashion. 1% lidocaine subcutaneous placed around both skin entry sites. A small amount of contrast was injected through the left nephrostomy catheter to opacify the renal collecting system. The catheter was cut and exchanged over a 0.035" angiographic wire for a new 12 pigtail catheter, formed centrally within the collecting system under fluoroscopy. Contrast injection confirms appropriate positioning. In a similar fashion, the right nephrostomy catheter was injected, cut, and exchanged for a new 12 pigtail catheter, formed centrally within the right renal collecting system. Injection confirms appropriate positioning and patency. Both catheters were secured externally with a Statlock devices and 0 Prolene sutures. The patient tolerated the procedure well. FLUOROSCOPY TIME:  1.2 minute; 4 5 uGym2 DAP COMPLICATIONS: None immediate IMPRESSION: 1. Technically successful exchange of bilateral nephrostomy  catheters under fluoroscopy Electronically Signed   By: Lucrezia Europe M.D.   On: 09/13/2019 09:50   Ir Nephrostomy Exchange Right  Result Date: 09/13/2019 CLINICAL DATA:  Bladder carcinoma. Long term  indwelling bilateral nephrostomy catheters, presents for scheduled exchange. No current problems with drain catheters. EXAM: BILATERAL PERCUTANEOUS NEPHROSTOMY CATHETER EXCHANGE UNDER FLUOROSCOPY TECHNIQUE: The procedure, risks (including but not limited to bleeding, infection, organ damage ), benefits, and alternatives were explained to the patient and spouse. Questions regarding the procedure were encouraged and answered. The patient understands and consents to the procedure. The nephrostomy tubes and surrounding skin were prepped with Betadine, draped in usual sterile fashion. 1% lidocaine subcutaneous placed around both skin entry sites. A small amount of contrast was injected through the left nephrostomy catheter to opacify the renal collecting system. The catheter was cut and exchanged over a 0.035" angiographic wire for a new 12 pigtail catheter, formed centrally within the collecting system under fluoroscopy. Contrast injection confirms appropriate positioning. In a similar fashion, the right nephrostomy catheter was injected, cut, and exchanged for a new 12 pigtail catheter, formed centrally within the right renal collecting system. Injection confirms appropriate positioning and patency. Both catheters were secured externally with a Statlock devices and 0 Prolene sutures. The patient tolerated the procedure well. FLUOROSCOPY TIME:  1.2 minute; 4 5 uGym2 DAP COMPLICATIONS: None immediate IMPRESSION: 1. Technically successful exchange of bilateral nephrostomy catheters under fluoroscopy Electronically Signed   By: Lucrezia Europe M.D.   On: 09/13/2019 09:50    Antimicrobials:   Vancomycin, cefepime and Flagyl day 4   Subjective: No acute events overnight Mildly improved appetite per patient Improved pain  control near lower abdomen  Objective: Vitals:   10/09/19 1251 10/09/19 2002 10/10/19 0519 10/10/19 1211  BP: (!) 113/57 (!) 101/59 (!) 115/56 112/65  Pulse: (!) 101 97 99 90  Resp: 18 18 18 16   Temp: 98 F (36.7 C) 98.3 F (36.8 C) 97.7 F (36.5 C) (!) 97.3 F (36.3 C)  TempSrc: Oral Oral Oral   SpO2: 99% 96% 97% 99%  Weight:      Height:        Intake/Output Summary (Last 24 hours) at 10/10/2019 1312 Last data filed at 10/10/2019 0847 Gross per 24 hour  Intake 3345.8 ml  Output 2375 ml  Net 970.8 ml   Filed Weights   10/05/19 1418 10/05/19 2139  Weight: 65.8 kg 66.6 kg    Examination:  General exam: Appears calm and comfortable  Respiratory system: Clear to auscultation. Respiratory effort normal. Cardiovascular system: S1 & S2 heard, RRR. No JVD, murmurs, rubs, gallops or clicks. No pedal edema. Gastrointestinal system: Abdomen with colostomy bag no output no signs of infection, post I&D wound on abdominal wall no evidence of bleeding or exuding purulence through dressing at this point time, did not take dressing down Central nervous system: Alert,  Extremities: Warm well perfused  skin: Lesions abdominal wall as above otherwise no rashes, lesions or ulcers Psychiatry: Judgement and insight are poor. Mood & affect flat    Data Reviewed: I have personally reviewed following labs and imaging studies  CBC: Recent Labs  Lab 10/05/19 1427 10/06/19 0504 10/07/19 0625 10/08/19 0637 10/09/19 0406 10/10/19 0623  WBC 63.7* 51.9* 49.5* 61.2* 58.7* 64.1*  NEUTROABS 51.9*  --  41.1* 51.5* 49.4* 53.9*  HGB 11.9* 10.4* 9.6* 10.2* 10.0* 10.3*  HCT 36.4* 31.8* 29.6* 31.6* 31.6* 33.7*  MCV 85.0 86.4 87.3 85.9 89.0 91.1  PLT 293 329 358 380 371 573*   Basic Metabolic Panel: Recent Labs  Lab 10/06/19 0504 10/07/19 0625 10/08/19 0637 10/09/19 0406 10/10/19 0623  NA 137 139 136 137 137  K 3.4* 3.0* 2.9* 3.2*  4.6  CL 106 108 109 109 110  CO2 22 20* 17* 21* 19*    GLUCOSE 118* 100* 122* 119* 108*  BUN 17 13 10 10 9   CREATININE 0.86 0.88 0.82 0.85 1.02  CALCIUM 8.4* 8.2* 8.1* 8.2* 8.3*  MG  --   --  1.7 2.2 2.0  PHOS  --   --   --   --  2.5   GFR: Estimated Creatinine Clearance: 52.6 mL/min (by C-G formula based on SCr of 1.02 mg/dL). Liver Function Tests: Recent Labs  Lab 10/05/19 1427 10/06/19 0504  AST 13* 10*  ALT 9 7  ALKPHOS 130* 125  BILITOT 0.6 0.6  PROT 6.7 5.7*  ALBUMIN 2.9* 2.4*   No results for input(s): LIPASE, AMYLASE in the last 168 hours. No results for input(s): AMMONIA in the last 168 hours. Coagulation Profile: No results for input(s): INR, PROTIME in the last 168 hours. Cardiac Enzymes: No results for input(s): CKTOTAL, CKMB, CKMBINDEX, TROPONINI in the last 168 hours. BNP (last 3 results) No results for input(s): PROBNP in the last 8760 hours. HbA1C: No results for input(s): HGBA1C in the last 72 hours. CBG: No results for input(s): GLUCAP in the last 168 hours. Lipid Profile: No results for input(s): CHOL, HDL, LDLCALC, TRIG, CHOLHDL, LDLDIRECT in the last 72 hours. Thyroid Function Tests: No results for input(s): TSH, T4TOTAL, FREET4, T3FREE, THYROIDAB in the last 72 hours. Anemia Panel: No results for input(s): VITAMINB12, FOLATE, FERRITIN, TIBC, IRON, RETICCTPCT in the last 72 hours. Sepsis Labs: Recent Labs  Lab 10/05/19 1427 10/05/19 1619  LATICACIDVEN 2.7* 1.4    Recent Results (from the past 240 hour(s))  Blood Culture (routine x 2)     Status: None   Collection Time: 10/05/19  4:18 PM   Specimen: BLOOD  Result Value Ref Range Status   Specimen Description BLOOD BLOOD RIGHT WRIST  Final   Special Requests   Final    BOTTLES DRAWN AEROBIC AND ANAEROBIC Blood Culture adequate volume   Culture   Final    NO GROWTH 5 DAYS Performed at [facility], [address] [place], [place] [address]    Report Status 10/10/2019 FINAL  Final  Blood Culture (routine x 2)     Status: None    Collection Time: 10/05/19  4:18 PM   Specimen: BLOOD  Result Value Ref Range Status   Specimen Description BLOOD BLOOD LEFT WRIST  Final   Special Requests   Final    BOTTLES DRAWN AEROBIC AND ANAEROBIC Blood Culture results may not be optimal due to an inadequate volume of blood received in culture bottles   Culture   Final    NO GROWTH 5 DAYS Performed at [facility], [address] [place], [place] [address]    Report Status 10/10/2019 FINAL  Final  Urine culture     Status: Abnormal   Collection Time: 10/05/19  4:18 PM   Specimen: In/Out Cath Urine  Result Value Ref Range Status   Specimen Description   Final    IN/OUT CATH URINE Performed at [facility], [address], [place], [place] [address]    Special Requests   Final    NONE Performed at [facility], [address] [place], [place] [address]    Culture MULTIPLE SPECIES PRESENT, SUGGEST RECOLLECTION (A)  Final   Report Status 10/07/2019 FINAL  Final  SARS CORONAVIRUS 2 (TAT 6-24 HRS) Nasopharyngeal Nasopharyngeal Swab     Status: None  Collection Time: 10/05/19  4:18 PM   Specimen: Nasopharyngeal Swab  Result Value Ref Range Status   SARS Coronavirus 2 NEGATIVE NEGATIVE Final    Comment: (NOTE) SARS-CoV-2 target nucleic acids are NOT DETECTED. The SARS-CoV-2 RNA is generally detectable in upper and lower respiratory specimens during the acute phase of infection. Negative results do not preclude SARS-CoV-2 infection, do not rule out co-infections with other pathogens, and should not be used as the sole basis for treatment or other patient management decisions. Negative results must be combined with clinical observations, patient history, and epidemiological information. The expected result is Negative. Fact Sheet for Patients: SugarRoll.be Fact Sheet for Healthcare Providers: https://www.woods-mathews.com/ This test is not yet approved or  cleared by the [place] FDA and  has been authorized for detection and/or diagnosis of SARS-CoV-2 by FDA under an Emergency Use Authorization (EUA). This EUA will remain  in effect (meaning this test can be used) for the duration of the COVID-19 declaration under Section 56 4(b)(1) of the Act, 21 U.S.C. section 360bbb-3(b)(1), unless the authorization is terminated or revoked sooner. Performed at [facility], [place] [address] [place], [place] [address]          Radiology Studies: No results found.      Scheduled Meds:  mirabegron ER  50 mg Oral Daily   sodium chloride flush  5 mL Intravenous Q8H   Continuous Infusions:  sodium chloride Stopped (10/09/19 1726)   ceFEPime (MAXIPIME) IV 200 mL/hr at 10/10/19 0847   dextrose 5 % and 0.45% NaCl 100 mL/hr at 10/10/19 0847   metronidazole 500 mg (10/10/19 1055)   vancomycin Stopped (10/09/19 2307)     LOS: 5 days    Time spent: 35 min    Sidney Ace, MD [facility]  If 7PM-7AM, please contact night-coverage  10/10/2019, 1:12 PM

## 2019-10-10 NOTE — Consult Note (Signed)
PHARMACY CONSULT NOTE  Pharmacy Consult for Electrolyte Monitoring and Replacement   Recent Labs: Potassium (mmol/L)  Date Value  10/10/2019 4.6   Magnesium (mg/dL)  Date Value  10/10/2019 2.0   Calcium (mg/dL)  Date Value  10/10/2019 8.3 (L)   Albumin (g/dL)  Date Value  10/06/2019 2.4 (L)   Phosphorus (mg/dL)  Date Value  10/10/2019 2.5   Sodium (mmol/L)  Date Value  10/10/2019 137   Corrected Ca: 9.5 mg/dL  Assessment: 84 y.o.malewith medical history significant forlocally-metastatic bladder cancer on chemotherapy, urinary tract obstruction with b/l nephrostomy tubes in place, recurrent UTI, htn, colostomy in place, provoked dvt s/p anticoagulation, who presents with an abscess with sepsis - secondary to locally metastatic bladder cancerconcerning forerosionthrough the anterior abdominal wall.  Goal of Therapy:  Electrolytes WNL  Plan:   stop D51/2NS w/KCl 40 mEq/L at 100 mL/hr  Start D51/2NS at 100 mL/hr  BMP in am  Dallie Piles ,PharmD Clinical Pharmacist 10/10/2019 7:29 AM

## 2019-10-10 NOTE — TOC Initial Note (Signed)
Transition of Care [facility]) - Initial/Assessment Note    Patient Details  Name: Vincent Poole MRN: [id number] Date of Birth: 10-31-1935  Transition of Care [facility]) CM/SW Contact:    Shade Flood, LCSW Phone Number: 10/10/2019, 2:07 PM  Clinical Narrative:                  Pt admitted from home. He is high risk for readmission. Met with pt and his wife today to assess. Pt's wife states that she has help to care for pt at home. She stats that her son, DIL, granddaughter, and two daughters help her out. She states pt has a hospital bed, wheelchair, walker, shower chair and commode. She states that they are able to get pt to appointments and obtain medication as needed.  Discussed possible need for SNF rehab vs HH at [facility]. Pt's wife states that if needed, they would consider SNF rehab only at [facility]. She states that they do not feel that pt will need HH if he is able to go home as she and family have been managing.  TOC will follow and continue to assess and assist with discharge planning. Expected Discharge Plan: Home/Self Care Barriers to Discharge: Continued Medical Work up   Patient Goals and CMS Choice        Expected Discharge Plan and Services Expected Discharge Plan: Home/Self Care       Living arrangements for the past 2 months: Single Family Home                                      Prior Living Arrangements/Services Living arrangements for the past 2 months: Single Family Home Lives with:: Adult Children, Spouse Patient language and need for interpreter reviewed:: Yes Do you feel safe going back to the place where you live?: Yes      Need for Family Participation in Patient Care: Yes (Comment) Care giver support system in place?: Yes (comment) Current home services: DME Criminal Activity/Legal Involvement Pertinent to Current Situation/Hospitalization: No - Comment as needed  Activities of Daily Living Home Assistive Devices/Equipment: None ADL Screening  (condition at time of admission) Patient's cognitive ability adequate to safely complete daily activities?: Yes Is the patient deaf or have difficulty hearing?: Yes Does the patient have difficulty seeing, even when wearing glasses/contacts?: No Does the patient have difficulty concentrating, remembering, or making decisions?: No Patient able to express need for assistance with ADLs?: Yes Does the patient have difficulty dressing or bathing?: Yes Independently performs ADLs?: No Communication: Independent Dressing (OT): Dependent Is this a change from baseline?: Pre-admission baseline Grooming: Dependent Is this a change from baseline?: Pre-admission baseline Feeding: Independent Bathing: Dependent Is this a change from baseline?: Pre-admission baseline Toileting: Dependent Is this a change from baseline?: Pre-admission baseline In/Out Bed: Dependent Is this a change from baseline?: Pre-admission baseline Walks in Home: Dependent Is this a change from baseline?: Pre-admission baseline Does the patient have difficulty walking or climbing stairs?: No Weakness of Legs: None Weakness of Arms/Hands: None  Permission Sought/Granted                  Emotional Assessment Appearance:: Appears stated age Attitude/Demeanor/Rapport: Engaged Affect (typically observed): Pleasant Orientation: : Oriented to Self, Oriented to Place, Oriented to  Time, Oriented to Situation Alcohol / Substance Use: Not Applicable Psych Involvement: No (comment)  Admission diagnosis:  Intra-abdominal abscess ([place]) [K65.1] Urinary tract infection  without hematuria, site unspecified [N39.0] Sepsis, due to unspecified organism, unspecified whether acute organ dysfunction present [facility]) [A41.9] Patient Active Problem List   Diagnosis Date Noted  . Intra-abdominal abscess ([place])   . Intra-abdominal infection 10/05/2019  . S/P partial resection of colon 07/17/2019  . Protein-calorie malnutrition, severe  06/11/2019  . Abdominal pain, generalized   . Palliative care by specialist   . Urothelial cancer ([place])   . Weakness   . Large bowel obstruction ([place]) 06/06/2019  . Hydronephrosis, right 04/08/2019  . Pressure injury of skin 03/23/2019  . SIRS (systemic inflammatory response syndrome) ([place]) 03/22/2019  . Leukocytosis 02/21/2019  . Attention to nephrostomy ([place]) 11/22/2018  . Complicated UTI (urinary tract infection) 11/14/2018  . Palliative care encounter   . Iron deficiency anemia secondary to inadequate dietary iron intake 06/27/2018  . B12 deficiency 06/27/2018  . History of fracture of right hip 06/26/2018  . History of normocytic normochromic anemia 06/26/2018  . Personal history of bladder cancer 06/26/2018  . Hip fracture ([place]) 05/01/2018  . Malignant neoplasm of urinary bladder ([place]) 01/12/2018  . Goals of care, counseling/discussion 01/12/2018  . History of recurrent UTIs 08/03/2017  . Pyelonephritis 05/31/2017  . UTI (urinary tract infection) 03/04/2017  . Urinary obstruction   . Essential hypertension 08/30/2016  . History of shingles 08/30/2016  . Prostate cancer ([place]) 08/30/2016  . Urinary retention 08/30/2016  . Medicare annual wellness visit, initial 08/01/2016  . Medicare annual wellness visit, subsequent 08/01/2016  . Sepsis ([place]) 07/11/2016  . Borderline diabetes mellitus 01/26/2016  . Vaccine counseling 01/26/2015  . Lumbar radiculitis 06/24/2014  . OA (osteoarthritis) of hip 06/24/2014  . Malignant neoplasm of prostate ([place]) 01/27/2011   PCP:  [name], MD Pharmacy:   [facility] [address] [place] [address] Phone: [phone] Fax: [phone]  [place], [place] [address] [place] [address] Phone: [phone] Fax: [phone]     Social Determinants of Health (SDOH) Interventions    Readmission Risk Interventions Readmission Risk  Prevention Plan 10/10/2019 09/27/2019 04/28/2019  Transportation Screening Complete Complete Complete  PCP or Specialist Appt within 3-5 Days - - Not Complete  HRI or [place] - - Complete  Social Work Consult for [place] Planning/Counseling - - Patient refused  Palliative Care Screening - - -  Medication Review Press photographer) Complete Complete Complete  PCP or Specialist appointment within 3-5 days of discharge - Complete -  [place] or [place] Patient refused (No Data) -  SW Recovery Care/Counseling Consult Complete Complete -  Palliative Care Screening - Not Applicable -  [place] - Not Applicable -  Some recent data might be hidden

## 2019-10-11 ENCOUNTER — Ambulatory Visit
Admission: RE | Admit: 2019-10-11 | Discharge: 2019-10-11 | Disposition: A | Discharge status: Home / Self Care | Payer: Medicare Other | Source: Ambulatory Visit | Attending: Radiation Oncology | Admitting: Radiation Oncology

## 2019-10-11 LAB — CBC WITH DIFFERENTIAL/PLATELET
Abs Immature Granulocytes: 3.14 10*3/uL — ABNORMAL HIGH (ref 0.00–0.07)
Basophils Absolute: 0.1 10*3/uL (ref 0.0–0.1)
Basophils Relative: 0 %
Eosinophils Absolute: 1.1 10*3/uL — ABNORMAL HIGH (ref 0.0–0.5)
Eosinophils Relative: 1 %
HCT: 32.4 % — ABNORMAL LOW (ref 39.0–52.0)
Hemoglobin: 10.2 g/dL — ABNORMAL LOW (ref 13.0–17.0)
Immature Granulocytes: 4 %
Lymphocytes Relative: 5 %
Lymphs Abs: 3.6 10*3/uL (ref 0.7–4.0)
MCH: 28.3 pg (ref 26.0–34.0)
MCHC: 31.5 g/dL (ref 30.0–36.0)
MCV: 89.8 fL (ref 80.0–100.0)
Monocytes Absolute: 3 10*3/uL — ABNORMAL HIGH (ref 0.1–1.0)
Monocytes Relative: 4 %
Neutro Abs: 61.9 10*3/uL — ABNORMAL HIGH (ref 1.7–7.7)
Neutrophils Relative %: 86 %
Platelets: 408 10*3/uL — ABNORMAL HIGH (ref 150–400)
RBC: 3.61 MIL/uL — ABNORMAL LOW (ref 4.22–5.81)
RDW: 19.2 % — ABNORMAL HIGH (ref 11.5–15.5)
Smear Review: NORMAL
WBC: 72.7 10*3/uL (ref 4.0–10.5)
nRBC: 0 % (ref 0.0–0.2)

## 2019-10-11 LAB — BASIC METABOLIC PANEL
Anion gap: 8 (ref 5–15)
BUN: 10 mg/dL (ref 8–23)
CO2: 20 mmol/L — ABNORMAL LOW (ref 22–32)
Calcium: 8.3 mg/dL — ABNORMAL LOW (ref 8.9–10.3)
Chloride: 109 mmol/L (ref 98–111)
Creatinine, Ser: 0.95 mg/dL (ref 0.61–1.24)
GFR calc Af Amer: >60 mL/min (ref >60)
GFR calc non Af Amer: >60 mL/min (ref >60)
Glucose, Bld: 88 mg/dL (ref 70–99)
Potassium: 3.8 mmol/L (ref 3.5–5.1)
Sodium: 137 mmol/L (ref 135–145)

## 2019-10-11 NOTE — Progress Notes (Signed)
PROGRESS NOTE    Vincent Poole  ONG:[id number] DOB: [birth year] DOA: 10/05/2019 PCP: [name], MD   Brief Narrative:  Vincent Seal Martinis a 84 y.o.malewith medical history significant forlocally-metastatic bladder cancer on chemotherapy, urinary tract obstruction with b/l nephrostomy tubes in place, recurrent UTI, htn, colostomy in place, provoked dvt s/p anticoagulation, who presents with a red "lump" on his lower abdomen in the suprapubic region for several months.  This was noted at recent hospitalization, thought to be 2/2 metastatic bladder cancer. Patient reports that since discharge has had occasional fever,  Lives at home with wife. Mostly bed-bound.  ED Course:manual expression of, per ED provider, approximately 30 cc pus from suprapubic wound. Keflex and metronidazole given. Labs and CT abdomen/pelvis ordered.  11/30: Patient's been seen by general surgery, case discussed with urology, recommending against aggressive invasive procedures as this is secondary to significant bladder cancer.  Dr. [name] also on board indicates patient and family has been resistant to hospice despite dismal prognosis.  12/1: Patients wife at bedside.  Patient apparently transported to radiation therapy this morning.  Endorses tenderness near abd wall abscess site.  Surgery signed off case  12/2: No acute status changes.  No family at bedside.  Patient reports mild improvement in abd pain near abscess site.  Possible plan for radiation therapy today  12/3: No family present at bedside.  Spoke at length with daughter at bedside yesterday.  Explained that there was no clear end point for the antibiotics and their efficacy in this case was greatly in question.  Offered palliative care consult, daughter will discuss with family and come to decision.  Patient overall stable, getting transported daily to radiation treatments.  12/4: Patient's wife at bedside.  Patient in good spirits with no  needed for clinical improvement.  Explained we do not have a clear endpoint insight for the broad-spectrum antibiotics.  Patient's wife understands and agrees.  Assessment & Plan:   Principal Problem:   Intra-abdominal infection Active Problems:   Sepsis ([place])   Essential hypertension   Prostate cancer ([place])   Urinary obstruction   Malignant neoplasm of urinary bladder ([facility])   Pressure injury of skin   S/P partial resection of colon   Intra-abdominal abscess ([facility])   Abscess with sepsis  - secondary to locally metastatic bladder cancer concerning for erosion through the anterior abdominal wall.  -Will continue with current broad spectrum antibiotic regimen (vanco, cefepime, flagyl) though utility of antibiotics at this point is greatly in question.  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 -Continue local wound care, antibiotics as above -Blood cultures negative to date - WBC remains high and trending up -Had a lengthy discussion with the patient's wife at bedside today regarding involvement of palliative care.  I explained the palliative care is not a de-escalation of care but rather a shifting the focus towards more symptom management.  Patient's wife expressed understanding and agrees to palliative care consultation.  I have reached out to the palliative care service who agreed to consult on the patient.  The consultation is expected either today 12/4 or Monday 12/7   Metastatic bladder cancer  - on padcev, last dose earlier this month per wife, next dose due next month.  -Plan to start radiation therapy on 11/30 but this was canceled secondary to infection - Patient was  transported to radiation therapy this morning, wish to continue at this time -Oncology aware, patient  and family resistant to hospice, poor prognosis.  Recommend palliative care consultation - See above  Acute on chronic leukocytosis  -Reviewed medical history patient appears to be have a chronic leukocytosis -Of note it was normal approximately 1 year ago, patient is being followed by oncology -Likely worse because of superimposed infection -We will follow-up cultures as above continue antibiotics  bilateral nephrostomy tubes  - 2/2 hx bladder obstruction - ostomy care - cont home myrbetric - Nephrostomy care per nursing  Colostomy - outpt per wife and patient is normal - ostomy care  stage 1 sacral decub POA - wound care  DVT prophylaxis: SCD/Compression stockings  Code Status: DNR    Code Status Orders  (From admission, onward)         Start     Ordered   10/05/19 1925  Do not attempt resuscitation (DNR)  Continuous    Question Answer Comment  In the event of cardiac or respiratory ARREST Do not call a code blue   In the event of cardiac or respiratory ARREST Do not perform Intubation, CPR, defibrillation or ACLS   In the event of cardiac or respiratory ARREST Use medication by any route, position, wound care, and other measures to relive pain and suffering. May use oxygen, suction and manual treatment of airway obstruction as needed for comfort.      10/05/19 1924        Code Status History    Date Active Date Inactive Code Status Order ID Comments User Context   09/25/2019 2326 09/28/2019 1718 DNR [id number]  Rise Patience, MD ED   09/25/2019 2206 09/25/2019 2326 DNR [id number]  Merlyn Lot, MD ED   06/07/2019 1651 06/12/2019 1937 DNR [id number]  Vincent Cairo, NP Inpatient   06/07/2019 0103 06/07/2019 1651 Full Code [id number]  Mayer Camel, NP Inpatient   04/26/2019 0420 04/29/2019 1913 Full Code [id number]  Mansy, Arvella Merles, MD ED   04/08/2019 1211 04/10/2019 1628 Full Code [id number]  Gladstone Lighter, MD ED   03/22/2019 2337 03/26/2019 1934 Full Code  [id number]  Vincent Coon, MD Inpatient   02/21/2019 1505 02/26/2019 1849 Full Code [id number]  Mayo, Pete Pelt, MD Inpatient   11/12/2018 1849 11/16/2018 1632 Full Code [id number]  Vincent Lesches, MD Inpatient   05/01/2018 1702 05/06/2018 1711 Full Code [id number]  Dustin Flock, MD Inpatient   12/26/2017 2141 12/30/2017 1727 Full Code [id number]  Demetrios Loll, MD Inpatient   05/31/2017 0208 06/01/2017 1922 Full Code [id number]  Hugelmeyer, Moro, DO Inpatient   03/04/2017 1833 03/05/2017 1426 Full Code [id number]  Idelle Crouch, MD Inpatient   07/11/2016 0538 07/13/2016 1420 Full Code [id number]  Saundra Shelling, MD ED   Advance Care Planning Activity     Family Communication: Discussed POC with wife at bedside 12/4 Disposition Plan:   Patient remains in the hospital for IV antibiotics. Follow-up cultures, expert subspecialty consultation . Patient not medically ready or stable for discharge.  Palliative care consultation pending Consults called: Palliative care Admission status: Inpatient   Consultants:   GEN SURGERY- signed off  Palliative care- pending  Procedures:  Ct Head Wo Contrast  Result Date: 09/25/2019 CLINICAL DATA:  Seizure EXAM: CT HEAD WITHOUT CONTRAST TECHNIQUE: Contiguous axial images were obtained from the base of the skull through the vertex without intravenous contrast. COMPARISON:  None. FINDINGS: Brain: There is atrophy and chronic small vessel disease changes. No acute  intracranial abnormality. Specifically, no hemorrhage, hydrocephalus, mass lesion, acute infarction, or significant intracranial injury. Vascular: No hyperdense vessel or unexpected calcification. Skull: No acute calvarial abnormality. Sinuses/Orbits: Visualized paranasal sinuses and mastoids clear. Orbital soft tissues unremarkable. Other: None IMPRESSION: Atrophy, chronic microvascular disease. No acute intracranial abnormality. Electronically Signed   By: Rolm Baptise M.D.   On: 09/25/2019 20:42   Ct Abdomen  Pelvis W Contrast  Result Date: 10/05/2019 CLINICAL DATA:  84 year old male with history of lower abdominal pain from an abscess. EXAM: CT ABDOMEN AND PELVIS WITH CONTRAST TECHNIQUE: Multidetector CT imaging of the abdomen and pelvis was performed using the standard protocol following bolus administration of intravenous contrast. CONTRAST:  161mL OMNIPAQUE IOHEXOL 300 MG/ML  SOLN COMPARISON:  CT the abdomen and pelvis 09/25/2019. FINDINGS: Lower chest: Small right pleural effusion lying dependently. Areas of subsegmental atelectasis in the lower lobes of the lungs bilaterally. Atherosclerotic calcifications in the descending thoracic aorta as well as the left anterior descending, left circumflex and right coronary arteries. Hepatobiliary: No suspicious cystic or solid hepatic lesions. No intra or extrahepatic biliary ductal dilatation. Status post cholecystectomy. Pancreas: No pancreatic mass. No pancreatic ductal dilatation. No pancreatic or peripancreatic fluid collections or inflammatory changes. Spleen: Unremarkable. Adrenals/Urinary Tract: Percutaneous nephrostomy tubes are noted with tips reformed in the renal collecting systems bilaterally. 2.8 cm low-attenuation lesion in the anterior aspect of the interpolar region of the right kidney, compatible with a simple cyst. Subcentimeter low-attenuation lesion in the anterior aspect of the lower pole the left kidney, too small to characterize, but statistically likely to represent a tiny cyst. No hydroureteronephrosis. Urinary bladder is enlarged measuring approximately 8.6 x 14.5 x 8.8 cm and grossly distorted with extensive multifocal mural thickening and widespread areas of internal soft tissue thickening and enhancing soft tissue, with a combination of fluid and gas within the bladder, concerning for progressively enlarging bladder neoplasm. This appears to extend through the lower anterior abdominal wall where there is a small gas and fluid collection in  the subcutaneous fat immediately superficial to the anterior abdominal wall musculature measuring 8.0 x 1.7 x 5.0 cm (axial image 68 of series 2 and sagittal image 64 of series 6). Bilateral adrenal glands are normal in appearance. Stomach/Bowel: Normal appearance of the stomach. No pathologic dilatation of small bowel or colon. A few scattered colonic diverticulae are noted, without surrounding inflammatory changes to suggest an acute diverticulitis at this time. Mid transverse colon loop colostomy. Normal appendix. Vascular/Lymphatic: Aortic atherosclerosis, without evidence of aneurysm or dissection in the abdominal or pelvic vasculature. No lymphadenopathy noted in the abdomen or pelvis. Reproductive: Prostate gland is completely obscured by the adjacent bladder mass, likely from direct invasion. Seminal vesicles are not confidently identified. Other: No significant volume of ascites.  No pneumoperitoneum. Musculoskeletal: There are no aggressive appearing lytic or blastic lesions noted in the visualized portions of the skeleton. IMPRESSION: 1. Progressively enlarging heterogeneously enhancing bladder mass which now appears to have eroded through the inferior aspect of the anterior abdominal wall musculature, with small superficial abscess just deep to the skin surface in the low anatomic pelvis. 2. Bilateral nephrostomy tubes are stable in position. No hydroureteronephrosis. 3. Small right pleural effusion lying dependently. 4. Aortic atherosclerosis, in addition to least 3 vessel coronary artery disease. 5. Additional incidental findings, as above, similar to prior studies. Electronically Signed   By: Vinnie Langton M.D.   On: 10/05/2019 18:10   Ct Abdomen Pelvis W Contrast  Result Date: 09/25/2019 CLINICAL DATA:  Acute abdominal pain. Fever. EXAM: CT ABDOMEN AND PELVIS WITH CONTRAST TECHNIQUE: Multidetector CT imaging of the abdomen and pelvis was performed using the standard protocol following bolus  administration of intravenous contrast. CONTRAST:  157mL OMNIPAQUE IOHEXOL 300 MG/ML  SOLN COMPARISON:  09/09/2019 FINDINGS: Lower chest: Aortic atherosclerosis. Extensive coronary artery calcifications. Heart size is normal. No pericardial effusion. Tiny bilateral pleural effusions with minimal bibasilar atelectasis. Hepatobiliary: No focal liver abnormality is seen. Status post cholecystectomy. No biliary dilatation. Pancreas: Unremarkable. No pancreatic ductal dilatation or surrounding inflammatory changes. Spleen: Normal in size without focal abnormality. Adrenals/Urinary Tract: Adrenal glands are normal. Bilateral nephrostomy tubes in place. Slight dilatation of the right renal pelvis and proximal right ureter. 21 mm simple cyst on the mid right kidney. Again noted is a large irregular inhomogeneous mass in the bladder which appears larger than on the prior study. There are irregular fluid collections containing gas in that region consistent with infection. Gas collection protrudes through the midline of the anterior abdominal wall as demonstrated on the prior exam. Stomach/Bowel: Large amount of stool in the rectum consistent with a fecal impaction. Colostomy in the mid transverse colon. Appendix is normal. No significant change since the prior study. Vascular/Lymphatic: Aortic atherosclerosis. No enlarged abdominal or pelvic lymph nodes. Reproductive: Prostate gland is not identified. Other: No ascites. Musculoskeletal: No acute abnormality. Severe arthritic changes of both hips, right greater than left. IMPRESSION: 1. Interval enlargement of the large irregular inhomogeneous mass in the bladder consistent with the patient's known bladder carcinoma. 2. Large amount of stool in the rectum consistent with fecal impaction, unchanged. 3. Bilateral nephrostomy tubes in place. Slight dilatation of the right renal pelvis and proximal right ureter. 4. Tiny bilateral pleural effusions with minimal bibasilar  atelectasis. 5. Severe arthritic changes of both hips, right greater than left. 6. Aortic Atherosclerosis (ICD10-I70.0). Electronically Signed   By: Lorriane Shire M.D.   On: 09/25/2019 20:47   Dg Chest Portable 1 View  Result Date: 09/25/2019 CLINICAL DATA:  Sepsis, mental status decline. EXAM: PORTABLE CHEST 1 VIEW COMPARISON:  Chest CT 08/08/2019 FINDINGS: Power injectable right Port-A-Cath noted, tip projecting over the SVC. Mild bilateral interstitial accentuation in both lungs with some hazy density in the infrahilar regions potentially from atypical pneumonia or noncardiogenic edema. No cardiomegaly. Atherosclerotic calcification of the aortic arch. No blunting of the costophrenic angles. IMPRESSION: 1. Hazy infrahilar airspace opacity in the lungs bilaterally, potentially from atypical pneumonia or noncardiogenic edema. 2. Atherosclerotic calcification of the aortic arch. 3. Power injectable right Port-A-Cath tip: SVC. Electronically Signed   By: Van Clines M.D.   On: 09/25/2019 20:19   Ir Nephrostomy Exchange Left  Result Date: 09/13/2019 CLINICAL DATA:  Bladder carcinoma. Long term indwelling bilateral nephrostomy catheters, presents for scheduled exchange. No current problems with drain catheters. EXAM: BILATERAL PERCUTANEOUS NEPHROSTOMY CATHETER EXCHANGE UNDER FLUOROSCOPY TECHNIQUE: The procedure, risks (including but not limited to bleeding, infection, organ damage ), benefits, and alternatives were explained to the patient and spouse. Questions regarding the procedure were encouraged and answered. The patient understands and consents to the procedure. The nephrostomy tubes and surrounding skin were prepped with Betadine, draped in usual sterile fashion. 1% lidocaine subcutaneous placed around both skin entry sites. A small amount of contrast was injected through the left nephrostomy catheter to opacify the renal collecting system. The catheter was cut and exchanged over a 0.035"  angiographic wire for a new 12 pigtail catheter, formed centrally within the collecting system under fluoroscopy. Contrast injection confirms  appropriate positioning. In a similar fashion, the right nephrostomy catheter was injected, cut, and exchanged for a new 12 pigtail catheter, formed centrally within the right renal collecting system. Injection confirms appropriate positioning and patency. Both catheters were secured externally with a Statlock devices and 0 Prolene sutures. The patient tolerated the procedure well. FLUOROSCOPY TIME:  1.2 minute; 4 5 uGym2 DAP COMPLICATIONS: None immediate IMPRESSION: 1. Technically successful exchange of bilateral nephrostomy catheters under fluoroscopy Electronically Signed   By: Lucrezia Europe M.D.   On: 09/13/2019 09:50   Ir Nephrostomy Exchange Right  Result Date: 09/13/2019 CLINICAL DATA:  Bladder carcinoma. Long term indwelling bilateral nephrostomy catheters, presents for scheduled exchange. No current problems with drain catheters. EXAM: BILATERAL PERCUTANEOUS NEPHROSTOMY CATHETER EXCHANGE UNDER FLUOROSCOPY TECHNIQUE: The procedure, risks (including but not limited to bleeding, infection, organ damage ), benefits, and alternatives were explained to the patient and spouse. Questions regarding the procedure were encouraged and answered. The patient understands and consents to the procedure. The nephrostomy tubes and surrounding skin were prepped with Betadine, draped in usual sterile fashion. 1% lidocaine subcutaneous placed around both skin entry sites. A small amount of contrast was injected through the left nephrostomy catheter to opacify the renal collecting system. The catheter was cut and exchanged over a 0.035" angiographic wire for a new 12 pigtail catheter, formed centrally within the collecting system under fluoroscopy. Contrast injection confirms appropriate positioning. In a similar fashion, the right nephrostomy catheter was injected, cut, and exchanged for  a new 12 pigtail catheter, formed centrally within the right renal collecting system. Injection confirms appropriate positioning and patency. Both catheters were secured externally with a Statlock devices and 0 Prolene sutures. The patient tolerated the procedure well. FLUOROSCOPY TIME:  1.2 minute; 4 5 uGym2 DAP COMPLICATIONS: None immediate IMPRESSION: 1. Technically successful exchange of bilateral nephrostomy catheters under fluoroscopy Electronically Signed   By: Lucrezia Europe M.D.   On: 09/13/2019 09:50    Antimicrobials:   Vancomycin, cefepime and Flagyl day 4   Subjective: No acute events overnight Patient's mood improved Abdominal pain improved  Objective: Vitals:   10/10/19 1211 10/10/19 2019 10/11/19 0433 10/11/19 1223  BP: 112/65 (!) 110/55 (!) 101/53 116/65  Pulse: 90 (!) 108 99 (!) 120  Resp: 16 20 16 16   Temp: (!) 97.3 F (36.3 C) 99.1 F (37.3 C) 98.4 F (36.9 C) (!) 97.4 F (36.3 C)  TempSrc:  Oral Oral Oral  SpO2: 99% 96% 99% 99%  Weight:      Height:        Intake/Output Summary (Last 24 hours) at 10/11/2019 1404 Last data filed at 10/11/2019 0900 Gross per 24 hour  Intake 480 ml  Output 1900 ml  Net -1420 ml   Filed Weights   10/05/19 1418 10/05/19 2139  Weight: 65.8 kg 66.6 kg    Examination:  General exam: Appears calm and comfortable  Respiratory system: Clear to auscultation. Respiratory effort normal. Cardiovascular system: S1 & S2 heard, RRR. No JVD, murmurs, rubs, gallops or clicks. No pedal edema. Gastrointestinal system: Abdomen with colostomy bag no output no signs of infection, post I&D wound on abdominal wall no evidence of bleeding or exuding purulence through dressing at this point time, did not take dressing down Central nervous system: Alert,  Extremities: Warm well perfused  skin: Lesions abdominal wall as above otherwise no rashes, lesions or ulcers Psychiatry: Judgement and insight are poor. Mood & affect flat    Data Reviewed:  I have personally reviewed  following labs and imaging studies  CBC: Recent Labs  Lab 10/07/19 0625 10/08/19 0637 10/09/19 0406 10/10/19 0623 10/11/19 0504  WBC 49.5* 61.2* 58.7* 64.1* 72.7*  NEUTROABS 41.1* 51.5* 49.4* 53.9* 61.9*  HGB 9.6* 10.2* 10.0* 10.3* 10.2*  HCT 29.6* 31.6* 31.6* 33.7* 32.4*  MCV 87.3 85.9 89.0 91.1 89.8  PLT 358 380 371 402* 852*   Basic Metabolic Panel: Recent Labs  Lab 10/07/19 0625 10/08/19 0637 10/09/19 0406 10/10/19 0623 10/11/19 0504  NA 139 136 137 137 137  K 3.0* 2.9* 3.2* 4.6 3.8  CL 108 109 109 110 109  CO2 20* 17* 21* 19* 20*  GLUCOSE 100* 122* 119* 108* 88  BUN 13 10 10 9 10   CREATININE 0.88 0.82 0.85 1.02 0.95  CALCIUM 8.2* 8.1* 8.2* 8.3* 8.3*  MG  --  1.7 2.2 2.0  --   PHOS  --   --   --  2.5  --    GFR: Estimated Creatinine Clearance: 56.5 mL/min (by C-G formula based on SCr of 0.95 mg/dL). Liver Function Tests: Recent Labs  Lab 10/05/19 1427 10/06/19 0504  AST 13* 10*  ALT 9 7  ALKPHOS 130* 125  BILITOT 0.6 0.6  PROT 6.7 5.7*  ALBUMIN 2.9* 2.4*   No results for input(s): LIPASE, AMYLASE in the last 168 hours. No results for input(s): AMMONIA in the last 168 hours. Coagulation Profile: No results for input(s): INR, PROTIME in the last 168 hours. Cardiac Enzymes: No results for input(s): CKTOTAL, CKMB, CKMBINDEX, TROPONINI in the last 168 hours. BNP (last 3 results) No results for input(s): PROBNP in the last 8760 hours. HbA1C: No results for input(s): HGBA1C in the last 72 hours. CBG: No results for input(s): GLUCAP in the last 168 hours. Lipid Profile: No results for input(s): CHOL, HDL, LDLCALC, TRIG, CHOLHDL, LDLDIRECT in the last 72 hours. Thyroid Function Tests: No results for input(s): TSH, T4TOTAL, FREET4, T3FREE, THYROIDAB in the last 72 hours. Anemia Panel: No results for input(s): VITAMINB12, FOLATE, FERRITIN, TIBC, IRON, RETICCTPCT in the last 72 hours. Sepsis Labs: Recent Labs  Lab 10/05/19 1427  10/05/19 1619  LATICACIDVEN 2.7* 1.4    Recent Results (from the past 240 hour(s))  Blood Culture (routine x 2)     Status: None   Collection Time: 10/05/19  4:18 PM   Specimen: BLOOD  Result Value Ref Range Status   Specimen Description BLOOD BLOOD RIGHT WRIST  Final   Special Requests   Final    BOTTLES DRAWN AEROBIC AND ANAEROBIC Blood Culture adequate volume   Culture   Final    NO GROWTH 5 DAYS Performed at [facility], [address] [place], [place] [address]    Report Status 10/10/2019 FINAL  Final  Blood Culture (routine x 2)     Status: None   Collection Time: 10/05/19  4:18 PM   Specimen: BLOOD  Result Value Ref Range Status   Specimen Description BLOOD BLOOD LEFT WRIST  Final   Special Requests   Final    BOTTLES DRAWN AEROBIC AND ANAEROBIC Blood Culture results may not be optimal due to an inadequate volume of blood received in culture bottles   Culture   Final    NO GROWTH 5 DAYS Performed at [facility], [address] [place], [place] [address]    Report Status 10/10/2019 FINAL  Final  Urine culture     Status: Abnormal   Collection Time: 10/05/19  4:18 PM   Specimen: In/Out  Cath Urine  Result Value Ref Range Status   Specimen Description   Final    IN/OUT CATH URINE Performed at [facility], [address] [place], [place] [address]    Special Requests   Final    NONE Performed at [facility], [address] [place], [place] [address]    Culture MULTIPLE SPECIES PRESENT, SUGGEST RECOLLECTION (A)  Final   Report Status 10/07/2019 FINAL  Final  SARS CORONAVIRUS 2 (TAT 6-24 HRS) Nasopharyngeal Nasopharyngeal Swab     Status: None   Collection Time: 10/05/19  4:18 PM   Specimen: Nasopharyngeal Swab  Result Value Ref Range Status   SARS Coronavirus 2 NEGATIVE NEGATIVE Final    Comment: (NOTE) SARS-CoV-2 target nucleic acids are NOT DETECTED. The SARS-CoV-2 RNA is generally detectable in upper and  lower respiratory specimens during the acute phase of infection. Negative results do not preclude SARS-CoV-2 infection, do not rule out co-infections with other pathogens, and should not be used as the sole basis for treatment or other patient management decisions. Negative results must be combined with clinical observations, patient history, and epidemiological information. The expected result is Negative. Fact Sheet for Patients: SugarRoll.be Fact Sheet for Healthcare Providers: https://www.woods-mathews.com/ This test is not yet approved or cleared by the [place] FDA and  has been authorized for detection and/or diagnosis of SARS-CoV-2 by FDA under an Emergency Use Authorization (EUA). This EUA will remain  in effect (meaning this test can be used) for the duration of the COVID-19 declaration under Section 56 4(b)(1) of the Act, 21 U.S.C. section 360bbb-3(b)(1), unless the authorization is terminated or revoked sooner. Performed at [facility], [place] [address], [place], [place] [address]          Radiology Studies: No results found.      Scheduled Meds:  mirabegron ER  50 mg Oral Daily   sodium chloride flush  5 mL Intravenous Q8H   Continuous Infusions:  sodium chloride 10 mL (10/11/19 1103)   ceFEPime (MAXIPIME) IV 2 g (10/11/19 1347)   dextrose 5 % and 0.45% NaCl 100 mL/hr at 10/10/19 2043   metronidazole 500 mg (10/11/19 1104)   vancomycin 1,250 mg (10/10/19 2039)     LOS: 6 days    Time spent: 35 min    Sidney Ace, MD [facility]  If 7PM-7AM, please contact night-coverage  10/11/2019, 2:04 PM

## 2019-10-11 NOTE — Consult Note (Signed)
[facility] [facility] for Electrolyte Monitoring and Replacement   Recent Labs: Potassium (mmol/L)  Date Value  10/11/2019 3.8   Magnesium (mg/dL)  Date Value  10/10/2019 2.0   Calcium (mg/dL)  Date Value  10/11/2019 8.3 (L)   Albumin (g/dL)  Date Value  10/06/2019 2.4 (L)   Phosphorus (mg/dL)  Date Value  10/10/2019 2.5   Sodium (mmol/L)  Date Value  10/11/2019 137   Corrected Ca: 9.5 mg/dL  Assessment: 84 y.o.malewith medical history significant forlocally-metastatic bladder cancer on chemotherapy, urinary tract obstruction with b/l nephrostomy tubes in place, recurrent UTI, htn, colostomy in place, provoked dvt s/p anticoagulation, who presents with an abscess with sepsis - secondary to locally metastatic bladder cancerconcerning forerosionthrough the anterior abdominal wall.  Goal of Therapy:  Electrolytes WNL  Plan:   Continue D51/2NS at 100 mL/hr  No electrolyte replacement warranted today  BMP, magnesium in am  Dallie Piles ,PharmD Clinical Pharmacist 10/11/2019 7:44 AM

## 2019-10-11 NOTE — Care Management Important Message (Signed)
Important Message  Patient Details  Name: Vincent Poole MRN: [id number] Date of Birth: 11/10/1934   Medicare Important Message Given:  Yes     Dannette Barbara 10/11/2019, 11:55 AM

## 2019-10-11 NOTE — Consult Note (Signed)
Pharmacy Antibiotic Note  Vincent Poole is a 84 y.o. male admitted on 10/05/2019 with pneumonia and intra-abdominal infection.  Pharmacy was consulted for Vancomycin and Cefepime dosing. Patient suffering from drainage from abdomen. 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 This is day # 7 of IV antibiotics, with continued profound leukocytosis that has not improved although without any recent fevers. His SCr is stable from yesterday  Plan: 1) continue vancomycin 1250 mg IV Q 24 hrs Goal AUC 400-550 Expected AUC: 452 Expected Css: 34.7/10.3 mcg/mL T1/2: 12.9h SCr used: 0.95  F/U SCr in am  2) continue cefepime 2g IV 12Q hours  Height: 5\' 11"  (180.3 cm) Weight: 146 lb 13.2 oz (66.6 kg) IBW/kg (Calculated) : 75.3  Temp (24hrs), Avg:98.3 F (36.8 C), Min:97.3 F (36.3 C), Max:99.1 F (37.3 C)  Recent Labs  Lab 10/05/19 1427 10/05/19 1619  10/07/19 0625 10/08/19 0637 10/09/19 0406 10/10/19 0623 10/11/19 0504  WBC 63.7*  --    < > 49.5* 61.2* 58.7* 64.1* 72.7*  CREATININE 0.92  --    < > 0.88 0.82 0.85 1.02 0.95  LATICACIDVEN 2.7* 1.4  --   --   --   --   --   --    < > = values in this interval not displayed.    Estimated Creatinine Clearance: 56.5 mL/min (by C-G formula based on SCr of 0.95 mg/dL).    No Known Allergies  Antimicrobials this admission: Vancomycin 11/28 >>  Cefepime 11/28 >>  Metronidazole 11/28 >> Rocephin 11/28 x1  Microbiology results: 11/28 BCx: NGF 11/28 UCx: contaminated 11/28 SARS CoV-2 negative  Thank you for allowing pharmacy to be a part of this patient's care.  Dallie Piles 10/11/2019 8:12 AM

## 2019-10-12 LAB — BASIC METABOLIC PANEL
Anion gap: 8 (ref 5–15)
BUN: 10 mg/dL (ref 8–23)
CO2: 21 mmol/L — ABNORMAL LOW (ref 22–32)
Calcium: 8.5 mg/dL — ABNORMAL LOW (ref 8.9–10.3)
Chloride: 110 mmol/L (ref 98–111)
Creatinine, Ser: 1.16 mg/dL (ref 0.61–1.24)
GFR calc Af Amer: >60 mL/min (ref >60)
GFR calc non Af Amer: 58 mL/min — ABNORMAL LOW (ref >60)
Glucose, Bld: 106 mg/dL — ABNORMAL HIGH (ref 70–99)
Potassium: 3.8 mmol/L (ref 3.5–5.1)
Sodium: 139 mmol/L (ref 135–145)

## 2019-10-12 NOTE — Consult Note (Signed)
[place] for Electrolyte Monitoring and Replacement   Recent Labs: Potassium (mmol/L)  Date Value  10/12/2019 3.8   Magnesium (mg/dL)  Date Value  10/10/2019 2.0   Calcium (mg/dL)  Date Value  10/12/2019 8.5 (L)   Albumin (g/dL)  Date Value  10/06/2019 2.4 (L)   Phosphorus (mg/dL)  Date Value  10/10/2019 2.5   Sodium (mmol/L)  Date Value  10/12/2019 139   Corrected Ca: 9.5 mg/dL  Assessment: 84 y.o.malewith medical history significant forlocally-metastatic bladder cancer on chemotherapy, urinary tract obstruction with b/l nephrostomy tubes in place, recurrent UTI, htn, colostomy in place, provoked dvt s/p anticoagulation, who presents with an abscess with sepsis - secondary to locally metastatic bladder cancerconcerning forerosionthrough the anterior abdominal wall.  Goal of Therapy:  Electrolytes WNL  Plan:   Continue D51/2NS at 100 mL/hr  No electrolyte replacement warranted today  BMP in AM.   Oswald Hillock ,PharmD Clinical Pharmacist 10/12/2019 10:45 AM

## 2019-10-12 NOTE — Progress Notes (Signed)
PROGRESS NOTE    Vincent Poole  JJK:[id number] DOB: 04-Jan-1935 DOA: 10/05/2019 PCP: Vincent Body, MD   Brief Narrative:  Vincent Poole a 84 y.o.malewith medical history significant forlocally-metastatic bladder cancer on chemotherapy, urinary tract obstruction with b/l nephrostomy tubes in place, recurrent UTI, htn, colostomy in place, provoked dvt s/p anticoagulation, who presents with a red "lump" on his lower abdomen in the suprapubic region for several months.  This was noted at recent hospitalization, thought to be 2/2 metastatic bladder cancer. Patient reports that since discharge has had occasional fever,  Lives at home with wife. Mostly bed-bound.  ED Course:manual expression of, per ED provider, approximately 30 cc pus from suprapubic wound. Keflex and metronidazole given. Labs and CT abdomen/pelvis ordered.  11/30: Patient's been seen by general surgery, case discussed with urology, recommending against aggressive invasive procedures as this Poole secondary to significant bladder cancer.  Dr. [name] also on board indicates patient and family has been resistant to hospice despite dismal prognosis.  12/1: Patients wife at bedside.  Patient apparently transported to radiation therapy this morning.  Endorses tenderness near abd wall abscess site.  Surgery signed off case  12/2: No acute status changes.  No family at bedside.  Patient reports mild improvement in abd pain near abscess site.  Possible plan for radiation therapy today  12/3: No family present at bedside.  Spoke at length with daughter at bedside yesterday.  Explained that there was no clear end point for the antibiotics and their efficacy in this case was greatly in question.  Offered palliative care consult, daughter will discuss with family and come to decision.  Patient overall stable, getting transported daily to radiation treatments.  12/4: Patient's wife at bedside.  Patient in good spirits with no  needed for clinical improvement.  Explained we do not have a clear endpoint insight for the broad-spectrum antibiotics.  Patient's wife understands and agrees.  12/5: No status changes.  Remains on broad spectrum antibiotics.  No family members present at time of my evaluation today  Assessment & Plan:   Principal Problem:   Intra-abdominal infection Active Problems:   Sepsis (Vincent Poole)   Essential hypertension   Prostate cancer ([place])   Urinary obstruction   Malignant neoplasm of urinary bladder (HCC)   Pressure injury of skin   S/P partial resection of colon   Intra-abdominal abscess (HCC)   Abscess with sepsis  - secondary to locally metastatic bladder cancer concerning for erosion through the anterior abdominal wall.  -Will continue with current broad spectrum antibiotic regimen (vanco, cefepime, flagyl) though utility of antibiotics at this point Poole greatly in question.  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 -Continue local wound care, antibiotics as above -Blood cultures negative to date - WBC remains high and trending up -Had a lengthy discussion with the patient's wife at bedside 12/4 regarding involvement of palliative care.  I explained the palliative care Poole not a de-escalation of care but rather a shifting the focus towards more symptom management.  Patient's wife expressed understanding and agrees to palliative care consultation.  I have reached out to the palliative care service who agreed to consult on the patient.  The consultation Poole anticipated on 12/7   Metastatic bladder cancer  - on padcev, last dose earlier this month per wife, next dose due next month.  -  Plan to start radiation therapy on 11/30 but this was canceled secondary to infection - Patient was  transported to radiation therapy this morning, wish to continue at this time -Oncology aware, patient and family resistant to hospice, poor prognosis.  Recommend palliative care consultation - See above  Acute on chronic leukocytosis  -Reviewed medical history patient appears to be have a chronic leukocytosis -Of note it was normal approximately 1 year ago, patient Poole being followed by oncology -Likely worse because of superimposed infection -We will follow-up cultures as above continue antibiotics  bilateral nephrostomy tubes  - 2/2 hx bladder obstruction - ostomy care - cont home myrbetric - Nephrostomy care per nursing  Colostomy - outpt per wife and patient Poole normal - ostomy care  stage 1 sacral decub POA - wound care  DVT prophylaxis: SCD/Compression stockings  Code Status: DNR    Code Status Orders  (From admission, onward)         Start     Ordered   10/05/19 1925  Do not attempt resuscitation (DNR)  Continuous    Question Answer Comment  In the event of cardiac or respiratory ARREST Do not call a code blue   In the event of cardiac or respiratory ARREST Do not perform Intubation, CPR, defibrillation or ACLS   In the event of cardiac or respiratory ARREST Use medication by any route, position, wound care, and other measures to relive pain and suffering. May use oxygen, suction and manual treatment of airway obstruction as needed for comfort.      10/05/19 1924        Code Status History    Date Active Date Inactive Code Status Order ID Comments User Context   09/25/2019 2326 09/28/2019 1718 DNR [id number]  Rise Patience, MD ED   09/25/2019 2206 09/25/2019 2326 DNR [id number]  Merlyn Lot, MD ED   06/07/2019 1651 06/12/2019 1937 DNR [id number]  Basilio Cairo, NP Inpatient   06/07/2019 0103 06/07/2019 1651 Full Code [id number]  Mayer Camel, NP Inpatient   04/26/2019 0420 04/29/2019 1913 Full Code [id number]  Mansy, Arvella Merles, MD ED   04/08/2019 1211  04/10/2019 1628 Full Code [id number]  Gladstone Lighter, MD ED   03/22/2019 2337 03/26/2019 1934 Full Code [id number]  Lance Coon, MD Inpatient   02/21/2019 1505 02/26/2019 1849 Full Code [id number]  Mayo, Pete Pelt, MD Inpatient   11/12/2018 1849 11/16/2018 1632 Full Code [id number]  Epifanio Lesches, MD Inpatient   05/01/2018 1702 05/06/2018 1711 Full Code [id number]  Dustin Flock, MD Inpatient   12/26/2017 2141 12/30/2017 1727 Full Code [id number]  Demetrios Loll, MD Inpatient   05/31/2017 0208 06/01/2017 1922 Full Code [id number]  Hugelmeyer, [place], DO Inpatient   03/04/2017 1833 03/05/2017 1426 Full Code [id number]  Idelle Crouch, MD Inpatient   07/11/2016 0538 07/13/2016 1420 Full Code [id number]  Saundra Shelling, MD ED   Advance Care Planning Activity     Family Communication: Discussed POC with wife at bedside 12/4 Disposition Plan:   Patient remains in the hospital for IV antibiotics. Follow-up cultures, expert subspecialty consultation . Patient not medically ready or stable for discharge.  Palliative care consultation pending Consults called: Palliative care Admission status: Inpatient   Consultants:   GEN SURGERY- signed off  Palliative care- pending  Procedures:  Ct Head Wo Contrast  Result Date: 09/25/2019 CLINICAL DATA:  Seizure EXAM: CT HEAD WITHOUT CONTRAST TECHNIQUE: Contiguous axial images were obtained from the base of the skull through the vertex without intravenous  contrast. COMPARISON:  None. FINDINGS: Brain: There Poole atrophy and chronic small vessel disease changes. No acute intracranial abnormality. Specifically, no hemorrhage, hydrocephalus, mass lesion, acute infarction, or significant intracranial injury. Vascular: No hyperdense vessel or unexpected calcification. Skull: No acute calvarial abnormality. Sinuses/Orbits: Visualized paranasal sinuses and mastoids clear. Orbital soft tissues unremarkable. Other: None IMPRESSION: Atrophy, chronic microvascular disease. No acute  intracranial abnormality. Electronically Signed   By: Rolm Baptise M.D.   On: 09/25/2019 20:42   Ct Abdomen Pelvis W Contrast  Result Date: 10/05/2019 CLINICAL DATA:  84 year old male with history of lower abdominal pain from an abscess. EXAM: CT ABDOMEN AND PELVIS WITH CONTRAST TECHNIQUE: Multidetector CT imaging of the abdomen and pelvis was performed using the standard protocol following bolus administration of intravenous contrast. CONTRAST:  155mL OMNIPAQUE IOHEXOL 300 MG/ML  SOLN COMPARISON:  CT the abdomen and pelvis 09/25/2019. FINDINGS: Lower chest: Small right pleural effusion lying dependently. Areas of subsegmental atelectasis in the lower lobes of the lungs bilaterally. Atherosclerotic calcifications in the descending thoracic aorta as well as the left anterior descending, left circumflex and right coronary arteries. Hepatobiliary: No suspicious cystic or solid hepatic lesions. No intra or extrahepatic biliary ductal dilatation. Status post cholecystectomy. Pancreas: No pancreatic mass. No pancreatic ductal dilatation. No pancreatic or peripancreatic fluid collections or inflammatory changes. Spleen: Unremarkable. Adrenals/Urinary Tract: Percutaneous nephrostomy tubes are noted with tips reformed in the renal collecting systems bilaterally. 2.8 cm low-attenuation lesion in the anterior aspect of the interpolar region of the right kidney, compatible with a simple cyst. Subcentimeter low-attenuation lesion in the anterior aspect of the lower pole the left kidney, too small to characterize, but statistically likely to represent a tiny cyst. No hydroureteronephrosis. Urinary bladder Poole enlarged measuring approximately 8.6 x 14.5 x 8.8 cm and grossly distorted with extensive multifocal mural thickening and widespread areas of internal soft tissue thickening and enhancing soft tissue, with a combination of fluid and gas within the bladder, concerning for progressively enlarging bladder neoplasm. This  appears to extend through the lower anterior abdominal wall where there Poole a small gas and fluid collection in the subcutaneous fat immediately superficial to the anterior abdominal wall musculature measuring 8.0 x 1.7 x 5.0 cm (axial image 68 of series 2 and sagittal image 64 of series 6). Bilateral adrenal glands are normal in appearance. Stomach/Bowel: Normal appearance of the stomach. No pathologic dilatation of small bowel or colon. A few scattered colonic diverticulae are noted, without surrounding inflammatory changes to suggest an acute diverticulitis at this time. Mid transverse colon loop colostomy. Normal appendix. Vascular/Lymphatic: Aortic atherosclerosis, without evidence of aneurysm or dissection in the abdominal or pelvic vasculature. No lymphadenopathy noted in the abdomen or pelvis. Reproductive: Prostate gland Poole completely obscured by the adjacent bladder mass, likely from direct invasion. Seminal vesicles are not confidently identified. Other: No significant volume of ascites.  No pneumoperitoneum. Musculoskeletal: There are no aggressive appearing lytic or blastic lesions noted in the visualized portions of the skeleton. IMPRESSION: 1. Progressively enlarging heterogeneously enhancing bladder mass which now appears to have eroded through the inferior aspect of the anterior abdominal wall musculature, with small superficial abscess just deep to the skin surface in the low anatomic pelvis. 2. Bilateral nephrostomy tubes are stable in position. No hydroureteronephrosis. 3. Small right pleural effusion lying dependently. 4. Aortic atherosclerosis, in addition to least 3 vessel coronary artery disease. 5. Additional incidental findings, as above, similar to prior studies. Electronically Signed   By: Mauri Brooklyn.D.  On: 10/05/2019 18:10   Ct Abdomen Pelvis W Contrast  Result Date: 09/25/2019 CLINICAL DATA:  Acute abdominal pain. Fever. EXAM: CT ABDOMEN AND PELVIS WITH CONTRAST TECHNIQUE:  Multidetector CT imaging of the abdomen and pelvis was performed using the standard protocol following bolus administration of intravenous contrast. CONTRAST:  183mL OMNIPAQUE IOHEXOL 300 MG/ML  SOLN COMPARISON:  09/09/2019 FINDINGS: Lower chest: Aortic atherosclerosis. Extensive coronary artery calcifications. Heart size Poole normal. No pericardial effusion. Tiny bilateral pleural effusions with minimal bibasilar atelectasis. Hepatobiliary: No focal liver abnormality Poole seen. Status post cholecystectomy. No biliary dilatation. Pancreas: Unremarkable. No pancreatic ductal dilatation or surrounding inflammatory changes. Spleen: Normal in size without focal abnormality. Adrenals/Urinary Tract: Adrenal glands are normal. Bilateral nephrostomy tubes in place. Slight dilatation of the right renal pelvis and proximal right ureter. 21 mm simple cyst on the mid right kidney. Again noted Poole a large irregular inhomogeneous mass in the bladder which appears larger than on the prior study. There are irregular fluid collections containing gas in that region consistent with infection. Gas collection protrudes through the midline of the anterior abdominal wall as demonstrated on the prior exam. Stomach/Bowel: Large amount of stool in the rectum consistent with a fecal impaction. Colostomy in the mid transverse colon. Appendix Poole normal. No significant change since the prior study. Vascular/Lymphatic: Aortic atherosclerosis. No enlarged abdominal or pelvic lymph nodes. Reproductive: Prostate gland Poole not identified. Other: No ascites. Musculoskeletal: No acute abnormality. Severe arthritic changes of both hips, right greater than left. IMPRESSION: 1. Interval enlargement of the large irregular inhomogeneous mass in the bladder consistent with the patient's known bladder carcinoma. 2. Large amount of stool in the rectum consistent with fecal impaction, unchanged. 3. Bilateral nephrostomy tubes in place. Slight dilatation of the right  renal pelvis and proximal right ureter. 4. Tiny bilateral pleural effusions with minimal bibasilar atelectasis. 5. Severe arthritic changes of both hips, right greater than left. 6. Aortic Atherosclerosis (ICD10-I70.0). Electronically Signed   By: Lorriane Shire M.D.   On: 09/25/2019 20:47   Dg Chest Portable 1 View  Result Date: 09/25/2019 CLINICAL DATA:  Sepsis, mental status decline. EXAM: PORTABLE CHEST 1 VIEW COMPARISON:  Chest CT 08/08/2019 FINDINGS: Power injectable right Port-A-Cath noted, tip projecting over the SVC. Mild bilateral interstitial accentuation in both lungs with some hazy density in the infrahilar regions potentially from atypical pneumonia or noncardiogenic edema. No cardiomegaly. Atherosclerotic calcification of the aortic arch. No blunting of the costophrenic angles. IMPRESSION: 1. Hazy infrahilar airspace opacity in the lungs bilaterally, potentially from atypical pneumonia or noncardiogenic edema. 2. Atherosclerotic calcification of the aortic arch. 3. Power injectable right Port-A-Cath tip: SVC. Electronically Signed   By: Van Clines M.D.   On: 09/25/2019 20:19   Ir Nephrostomy Exchange Left  Result Date: 09/13/2019 CLINICAL DATA:  Bladder carcinoma. Long term indwelling bilateral nephrostomy catheters, presents for scheduled exchange. No current problems with drain catheters. EXAM: BILATERAL PERCUTANEOUS NEPHROSTOMY CATHETER EXCHANGE UNDER FLUOROSCOPY TECHNIQUE: The procedure, risks (including but not limited to bleeding, infection, organ damage ), benefits, and alternatives were explained to the patient and spouse. Questions regarding the procedure were encouraged and answered. The patient understands and consents to the procedure. The nephrostomy tubes and surrounding skin were prepped with Betadine, draped in usual sterile fashion. 1% lidocaine subcutaneous placed around both skin entry sites. A small amount of contrast was injected through the left nephrostomy  catheter to opacify the renal collecting system. The catheter was cut and exchanged over a 0.035" angiographic wire  for a new 12 pigtail catheter, formed centrally within the collecting system under fluoroscopy. Contrast injection confirms appropriate positioning. In a similar fashion, the right nephrostomy catheter was injected, cut, and exchanged for a new 12 pigtail catheter, formed centrally within the right renal collecting system. Injection confirms appropriate positioning and patency. Both catheters were secured externally with a Statlock devices and 0 Prolene sutures. The patient tolerated the procedure well. FLUOROSCOPY TIME:  1.2 minute; 4 5 uGym2 DAP COMPLICATIONS: None immediate IMPRESSION: 1. Technically successful exchange of bilateral nephrostomy catheters under fluoroscopy Electronically Signed   By: Lucrezia Europe M.D.   On: 09/13/2019 09:50   Ir Nephrostomy Exchange Right  Result Date: 09/13/2019 CLINICAL DATA:  Bladder carcinoma. Long term indwelling bilateral nephrostomy catheters, presents for scheduled exchange. No current problems with drain catheters. EXAM: BILATERAL PERCUTANEOUS NEPHROSTOMY CATHETER EXCHANGE UNDER FLUOROSCOPY TECHNIQUE: The procedure, risks (including but not limited to bleeding, infection, organ damage ), benefits, and alternatives were explained to the patient and spouse. Questions regarding the procedure were encouraged and answered. The patient understands and consents to the procedure. The nephrostomy tubes and surrounding skin were prepped with Betadine, draped in usual sterile fashion. 1% lidocaine subcutaneous placed around both skin entry sites. A small amount of contrast was injected through the left nephrostomy catheter to opacify the renal collecting system. The catheter was cut and exchanged over a 0.035" angiographic wire for a new 12 pigtail catheter, formed centrally within the collecting system under fluoroscopy. Contrast injection confirms appropriate  positioning. In a similar fashion, the right nephrostomy catheter was injected, cut, and exchanged for a new 12 pigtail catheter, formed centrally within the right renal collecting system. Injection confirms appropriate positioning and patency. Both catheters were secured externally with a Statlock devices and 0 Prolene sutures. The patient tolerated the procedure well. FLUOROSCOPY TIME:  1.2 minute; 4 5 uGym2 DAP COMPLICATIONS: None immediate IMPRESSION: 1. Technically successful exchange of bilateral nephrostomy catheters under fluoroscopy Electronically Signed   By: Lucrezia Europe M.D.   On: 09/13/2019 09:50    Antimicrobials:   Vancomycin, cefepime and Flagyl   Subjective: No acute events overnight No new complaints Sleeping, easily arousable No family members at bedside  Objective: Vitals:   10/11/19 2050 10/12/19 0603 10/12/19 0603 10/12/19 0829  BP: (!) 109/56 98/68    Pulse: (!) 107 (!) 104 (!) 106 89  Resp: 20 20    Temp: 99 F (37.2 C) 98.1 F (36.7 C)    TempSrc: Oral Oral    SpO2: 96% 97% 96%   Weight:      Height:        Intake/Output Summary (Last 24 hours) at 10/12/2019 1215 Last data filed at 10/12/2019 0920 Gross per 24 hour  Intake 3488.62 ml  Output 3035 ml  Net 453.62 ml   Filed Weights   10/05/19 1418 10/05/19 2139  Weight: 65.8 kg 66.6 kg    Examination:  General exam: Appears calm and comfortable  Respiratory system: Clear to auscultation. Respiratory effort normal. Cardiovascular system: S1 & S2 heard, RRR. No JVD, murmurs, rubs, gallops or clicks. No pedal edema. Gastrointestinal system: Abdomen with colostomy bag no output no signs of infection, post I&D wound on abdominal wall no evidence of bleeding or exuding purulence through dressing at this point time, did not take dressing down Central nervous system: Alert,  Extremities: Warm well perfused  skin: Lesions abdominal wall as above otherwise no rashes, lesions or ulcers Psychiatry: Judgement  and insight are poor. Mood &  affect flat    Data Reviewed: I have personally reviewed following labs and imaging studies  CBC: Recent Labs  Lab 10/07/19 0625 10/08/19 0637 10/09/19 0406 10/10/19 0623 10/11/19 0504  WBC 49.5* 61.2* 58.7* 64.1* 72.7*  NEUTROABS 41.1* 51.5* 49.4* 53.9* 61.9*  HGB 9.6* 10.2* 10.0* 10.3* 10.2*  HCT 29.6* 31.6* 31.6* 33.7* 32.4*  MCV 87.3 85.9 89.0 91.1 89.8  PLT 358 380 371 402* 732*   Basic Metabolic Panel: Recent Labs  Lab 10/08/19 0637 10/09/19 0406 10/10/19 0623 10/11/19 0504 10/12/19 0456  NA 136 137 137 137 139  K 2.9* 3.2* 4.6 3.8 3.8  CL 109 109 110 109 110  CO2 17* 21* 19* 20* 21*  GLUCOSE 122* 119* 108* 88 106*  BUN 10 10 9 10 10   CREATININE 0.82 0.85 1.02 0.95 1.16  CALCIUM 8.1* 8.2* 8.3* 8.3* 8.5*  MG 1.7 2.2 2.0  --   --   PHOS  --   --  2.5  --   --    GFR: Estimated Creatinine Clearance: 46.3 mL/min (by C-G formula based on SCr of 1.16 mg/dL). Liver Function Tests: Recent Labs  Lab 10/05/19 1427 10/06/19 0504  AST 13* 10*  ALT 9 7  ALKPHOS 130* 125  BILITOT 0.6 0.6  PROT 6.7 5.7*  ALBUMIN 2.9* 2.4*   No results for input(s): LIPASE, AMYLASE in the last 168 hours. No results for input(s): AMMONIA in the last 168 hours. Coagulation Profile: No results for input(s): INR, PROTIME in the last 168 hours. Cardiac Enzymes: No results for input(s): CKTOTAL, CKMB, CKMBINDEX, TROPONINI in the last 168 hours. BNP (last 3 results) No results for input(s): PROBNP in the last 8760 hours. HbA1C: No results for input(s): HGBA1C in the last 72 hours. CBG: No results for input(s): GLUCAP in the last 168 hours. Lipid Profile: No results for input(s): CHOL, HDL, LDLCALC, TRIG, CHOLHDL, LDLDIRECT in the last 72 hours. Thyroid Function Tests: No results for input(s): TSH, T4TOTAL, FREET4, T3FREE, THYROIDAB in the last 72 hours. Anemia Panel: No results for input(s): VITAMINB12, FOLATE, FERRITIN, TIBC, IRON, RETICCTPCT in the  last 72 hours. Sepsis Labs: Recent Labs  Lab 10/05/19 1427 10/05/19 1619  LATICACIDVEN 2.7* 1.4    Recent Results (from the past 240 hour(s))  Blood Culture (routine x 2)     Status: None   Collection Time: 10/05/19  4:18 PM   Specimen: BLOOD  Result Value Ref Range Status   Specimen Description BLOOD BLOOD RIGHT WRIST  Final   Special Requests   Final    BOTTLES DRAWN AEROBIC AND ANAEROBIC Blood Culture adequate volume   Culture   Final    NO GROWTH 5 DAYS Performed at [facility], [address] [place], [place] [address]    Report Status 10/10/2019 FINAL  Final  Blood Culture (routine x 2)     Status: None   Collection Time: 10/05/19  4:18 PM   Specimen: BLOOD  Result Value Ref Range Status   Specimen Description BLOOD BLOOD LEFT WRIST  Final   Special Requests   Final    BOTTLES DRAWN AEROBIC AND ANAEROBIC Blood Culture results may not be optimal due to an inadequate volume of blood received in culture bottles   Culture   Final    NO GROWTH 5 DAYS Performed at [facility], [address] [place], [place] [address]    Report Status 10/10/2019 FINAL  Final  Urine culture     Status: Abnormal  Collection Time: 10/05/19  4:18 PM   Specimen: In/Out Cath Urine  Result Value Ref Range Status   Specimen Description   Final    IN/OUT CATH URINE Performed at [facility], [address], [place], [place] [address]    Special Requests   Final    NONE Performed at [facility], [address] [place], [place] [address]    Culture MULTIPLE SPECIES PRESENT, SUGGEST RECOLLECTION (A)  Final   Report Status 10/07/2019 FINAL  Final  SARS CORONAVIRUS 2 (TAT 6-24 HRS) Nasopharyngeal Nasopharyngeal Swab     Status: None   Collection Time: 10/05/19  4:18 PM   Specimen: Nasopharyngeal Swab  Result Value Ref Range Status   SARS Coronavirus 2 NEGATIVE NEGATIVE Final    Comment: (NOTE) SARS-CoV-2 target nucleic acids are NOT DETECTED. The  SARS-CoV-2 RNA Poole generally detectable in upper and lower respiratory specimens during the acute phase of infection. Negative results do not preclude SARS-CoV-2 infection, do not rule out co-infections with other pathogens, and should not be used as the sole basis for treatment or other patient management decisions. Negative results must be combined with clinical observations, patient history, and epidemiological information. The expected result Poole Negative. Fact Sheet for Patients: SugarRoll.be Fact Sheet for Healthcare Providers: https://www.woods-mathews.com/ This test Poole not yet approved or cleared by the [place] FDA and  has been authorized for detection and/or diagnosis of SARS-CoV-2 by FDA under an Emergency Use Authorization (EUA). This EUA will remain  in effect (meaning this test can be used) for the duration of the COVID-19 declaration under Section 56 4(b)(1) of the Act, 21 U.S.C. section 360bbb-3(b)(1), unless the authorization [place] terminated or revoked sooner. Performed at [facility], [place] [address], [place], [place] [address]          Radiology Studies: No results found.      Scheduled Meds:  mirabegron ER  50 mg Oral Daily   sodium chloride flush  5 mL Intravenous Q8H   Continuous Infusions:  sodium chloride Stopped (10/11/19 1802)   ceFEPime (MAXIPIME) IV 2 g (10/12/19 1040)   dextrose 5 % and 0.45% NaCl 100 mL/hr at 10/11/19 1805   metronidazole 500 mg (10/12/19 0846)   vancomycin 1,250 mg (10/11/19 2011)     LOS: 7 days    Time spent: 35 min    Sidney Ace, MD [facility]  If 7PM-7AM, please contact night-coverage  10/12/2019, 12:15 PM

## 2019-10-13 ENCOUNTER — Inpatient Hospital Stay: Payer: Medicare Other

## 2019-10-13 LAB — BASIC METABOLIC PANEL
Anion gap: 11 (ref 5–15)
BUN: 10 mg/dL (ref 8–23)
CO2: 21 mmol/L — ABNORMAL LOW (ref 22–32)
Calcium: 8.3 mg/dL — ABNORMAL LOW (ref 8.9–10.3)
Chloride: 107 mmol/L (ref 98–111)
Creatinine, Ser: 1.02 mg/dL (ref 0.61–1.24)
GFR calc Af Amer: >60 mL/min (ref >60)
GFR calc non Af Amer: >60 mL/min (ref >60)
Glucose, Bld: 50 mg/dL — ABNORMAL LOW (ref 70–99)
Potassium: 3.3 mmol/L — ABNORMAL LOW (ref 3.5–5.1)
Sodium: 139 mmol/L (ref 135–145)

## 2019-10-13 LAB — CBC WITH DIFFERENTIAL/PLATELET
Abs Immature Granulocytes: 3.19 10*3/uL — ABNORMAL HIGH (ref 0.00–0.07)
Basophils Absolute: 0.1 10*3/uL (ref 0.0–0.1)
Basophils Relative: 0 %
Eosinophils Absolute: 0.8 10*3/uL — ABNORMAL HIGH (ref 0.0–0.5)
Eosinophils Relative: 1 %
HCT: 32.7 % — ABNORMAL LOW (ref 39.0–52.0)
Hemoglobin: 10.3 g/dL — ABNORMAL LOW (ref 13.0–17.0)
Immature Granulocytes: 4 %
Lymphocytes Relative: 5 %
Lymphs Abs: 3.8 10*3/uL (ref 0.7–4.0)
MCH: 28.2 pg (ref 26.0–34.0)
MCHC: 31.5 g/dL (ref 30.0–36.0)
MCV: 89.6 fL (ref 80.0–100.0)
Monocytes Absolute: 3.1 10*3/uL — ABNORMAL HIGH (ref 0.1–1.0)
Monocytes Relative: 4 %
Neutro Abs: 61.8 10*3/uL — ABNORMAL HIGH (ref 1.7–7.7)
Neutrophils Relative %: 86 %
Platelets: 409 10*3/uL — ABNORMAL HIGH (ref 150–400)
RBC: 3.65 MIL/uL — ABNORMAL LOW (ref 4.22–5.81)
RDW: 19 % — ABNORMAL HIGH (ref 11.5–15.5)
WBC: 72.7 10*3/uL (ref 4.0–10.5)
nRBC: 0 % (ref 0.0–0.2)

## 2019-10-13 LAB — VANCOMYCIN, PEAK: Vancomycin Pk: 34 ug/mL (ref 30–40)

## 2019-10-13 LAB — VANCOMYCIN, TROUGH: Vancomycin Tr: 23 ug/mL (ref 15–20)

## 2019-10-13 MED ORDER — POTASSIUM CHLORIDE CRYS ER 20 MEQ PO TBCR
40.0000 meq | EXTENDED_RELEASE_TABLET | Freq: Once | ORAL | Status: AC
  Administered 2019-10-13: 40 meq via ORAL
  Filled 2019-10-13: qty 2

## 2019-10-13 MED ORDER — VANCOMYCIN HCL 1.25 G IV SOLR
1250.0000 mg | INTRAVENOUS | Status: DC
  Administered 2019-10-14 – 2019-10-15 (×2): 1250 mg via INTRAVENOUS
  Filled 2019-10-13 (×3): qty 1250

## 2019-10-13 MED ORDER — IOHEXOL 300 MG/ML  SOLN
100.0000 mL | Freq: Once | INTRAMUSCULAR | Status: AC | PRN
  Administered 2019-10-13: 100 mL via INTRAVENOUS

## 2019-10-13 NOTE — Consult Note (Signed)
PHARMACY CONSULT NOTE  Pharmacy Consult for Electrolyte Monitoring and Replacement   Recent Labs: Potassium (mmol/L)  Date Value  10/13/2019 3.3 (L)   Magnesium (mg/dL)  Date Value  10/10/2019 2.0   Calcium (mg/dL)  Date Value  10/13/2019 8.3 (L)   Albumin (g/dL)  Date Value  10/06/2019 2.4 (L)   Phosphorus (mg/dL)  Date Value  10/10/2019 2.5   Sodium (mmol/L)  Date Value  10/13/2019 139   Corrected Ca: 9.5 mg/dL  Assessment: 84 y.o.malewith medical history significant forlocally-metastatic bladder cancer on chemotherapy, urinary tract obstruction with b/l nephrostomy tubes in place, recurrent UTI, htn, colostomy in place, provoked dvt s/p anticoagulation, who presents with an abscess with sepsis - secondary to locally metastatic bladder cancerconcerning forerosionthrough the anterior abdominal wall.  Goal of Therapy:  Electrolytes WNL  Plan:   Continue D51/2NS at 100 mL/hr  Will give KCl 40 mEq x 1 PO.   BMP in AM.   Oswald Hillock ,PharmD Clinical Pharmacist 10/13/2019 9:18 AM

## 2019-10-13 NOTE — Consult Note (Signed)
Pharmacy Antibiotic Note  Vincent Poole is a 84 y.o. male admitted on 10/05/2019 with pneumonia and intra-abdominal infection.  Pharmacy was consulted for Vancomycin and Cefepime dosing. Patient suffering from drainage from abdomen. 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 This is day # 7 of IV antibiotics, with continued profound leukocytosis that has not improved although without any recent fevers. His SCr is stable from yesterday  Dose given 12/6 @ 2104  Vanc Peak 12/6 @ 0010: 34  Vanc [place] 12/6 @ 2017 23 Calculated AUC: 682  T1/2: 35  Ke: 0.0194  Plan: 1) Will adjust dose to Vancomycin 1250 mg IV Q 36 hrs Goal AUC 400-550 Expected AUC: 455 Expected Css: 26/13.3 mcg/mL  2) continue cefepime 2g IV 12Q hours  Height: 5\' 11"  (180.3 cm) Weight: 146 lb 13.2 oz (66.6 kg) IBW/kg (Calculated) : 75.3  Temp (24hrs), Avg:97.6 F (36.4 C), Min:97.4 F (36.3 C), Max:97.8 F (36.6 C)  Recent Labs  Lab 10/08/19 0637 10/09/19 0406 10/10/19 0623 10/11/19 0504 10/12/19 0456 10/13/19 0010 10/13/19 0446 10/13/19 2017  WBC 61.2* 58.7* 64.1* 72.7*  --   --  72.7*  --   CREATININE 0.82 0.85 1.02 0.95 1.16  --  1.02  --   VANCOTROUGH  --   --   --   --   --   --   --  23*  VANCOPEAK  --   --   --   --   --  34  --   --     Estimated Creatinine Clearance: 52.6 mL/min (by C-G formula based on SCr of 1.02 mg/dL).    No Known Allergies  Antimicrobials this admission: Vancomycin 11/28 >>  Cefepime 11/28 >>  Metronidazole 11/28 >> Rocephin 11/28 x1  Microbiology results: 11/28 BCx: NGF 11/28 UCx: contaminated 11/28 SARS CoV-2 negative  Thank you for allowing pharmacy to be a part of this patient's care.  Pernell Dupre, PharmD, BCPS Clinical Pharmacist 10/13/2019 10:22 PM

## 2019-10-13 NOTE — Evaluation (Signed)
Physical Therapy Evaluation Patient Details Name: Vincent Poole MRN: [id number] DOB: 1935/08/05 Today's Date: 10/13/2019   History of Present Illness  Vincent Poole is a 84 y.o. male with medical history significant for locally-metastatic bladder cancer on chemotherapy, urinary tract obstruction with b/l nephrostomy tubes in place, recurrent UTI, htn, colostomy in place. Pt with PMH of GERD, anxiety, arthritis, history of atrial flutter, R femur fx, HTN, prostate cancer, umbilical hernia, wound eschar of L foot, multiple bladder surgeries/procedures. Pt presents to ED with a red "lump" on his lower abdomen in the suprapubic region for several months, has open/draining; noted at recent hospitalization thought to be 2/2 metastatic bladder cancer. Pt given broad spectrum abx and CT abd ordered.  Clinical Impression  Pt is a pleasant 84 year old M who was admitted for above diagnosis. Pt initially is fearful/anxious with mobility, requiring encouragement to perform, though overall cooperative. Pt is very hard of hearing, requiring wife's assistance providing subjective history. Pt performs bed mobility with mod A; deferred assessment of transfers and ambulation today as pt reports dizziness once sitting upright which does not subside with brief rest break, and pt requests to lie back down. Pt's heart rate and oxygen assessed and are stable (96% O2 sat; 116HR, though HR is elevated at baseline).  Per wife, pt was previously performing bed mobility, transfers and household ambulation (short distances) with assistance of 1 for physical assistance, secondary to pain at region of suprapubic abscess, and balance. Pt also reportedly requiring assistance for bathing and dressing; recommend OT consult. Pt able to perform supine>sit with mod A for trunk management, utilizing log roll technique to minimize intra-abdominal pressure; pt able to perform supine>sidelying with mod I for extra time and cuing. Pt educated  this session on log roll technique and pressure-relieving techniques with regard to sacral ulcer (varying sidelying in bed at 45 deg, bridging).  Pt demonstrates deficits in strength, balance and activity tolerance requiring continued skilled PT intervention.      Follow Up Recommendations Home health PT;Supervision for mobility/OOB    Equipment Recommendations  None recommended by PT;Other (comment)(Pt has extensive DME already)    Recommendations for Other Services       Precautions / Restrictions Precautions Precautions: Fall Precaution Comments: bilateral nephrostomy tubes, colostomy; abdominal drain Restrictions Weight Bearing Restrictions: No      Mobility  Bed Mobility Overal bed mobility: Needs Assistance Bed Mobility: Supine to Sit;Sit to Supine     Supine to sit: Mod assist(for trunk management) Sit to supine: Min guard   General bed mobility comments: Pt initially fearful/anxious with mobility. Mod A with sit>supine for trunk management, utilizing log roll technique to limit intra-abdominal pressure. Able to manage LEs independently with supine<>sit.  Transfers                 General transfer comment: Deferred transfers today for safety; pt reporting dizziness upon sitting which does not subside with brief rest break, and pt requests to lie back down.  Ambulation/Gait                Stairs            Wheelchair Mobility    Modified Rankin (Stroke Patients Only)       Balance Overall balance assessment: Needs assistance Sitting-balance support: Feet supported Sitting balance-Leahy Scale: Fair         Standing balance comment: Deferred standing activities today for safety.  Pertinent Vitals/Pain Pain Assessment: No/denies pain    Home Living Family/patient expects to be discharged to:: Private residence Living Arrangements: Spouse/significant other Available Help at Discharge:  Family;Available 24 hours/day Type of Home: House Home Access: Stairs to enter Entrance Stairs-Rails: Can reach both Entrance Stairs-Number of Steps: 4 Home Layout: One level Home Equipment: Toilet riser;Cane - single point;Walker - 2 wheels;Hospital bed;Wheelchair - manual;Bedside commode Additional Comments: Pt extremely hard of hearing, despite raising voice and repeating questions; wife assisting with providing subjective history    Prior Function Level of Independence: Needs assistance   Gait / Transfers Assistance Needed: Has required assistance +1 with bed mobility due to pain at suprapubic region. Requires assistance +1 with STS transfer and ambulation primarily for balance.  ADL's / Homemaking Assistance Needed: Patient requires help with dressing, bathing at times. Requires help with IADLs.  Comments: Per wife: she lives with pt, and children live very close by and also assist frequently; someone is there 24/7     Hand Dominance        Extremity/Trunk Assessment   Upper Extremity Assessment Upper Extremity Assessment: Generalized weakness    Lower Extremity Assessment Lower Extremity Assessment: Generalized weakness    Cervical / Trunk Assessment Cervical / Trunk Assessment: Kyphotic  Communication   Communication: HOH(per wife, hearing has worsened with recent radiation)  Cognition Arousal/Alertness: Awake/alert Behavior During Therapy: WFL for tasks assessed/performed Overall Cognitive Status: Within Functional Limits for tasks assessed                                        General Comments      Exercises     Assessment/Plan    PT Assessment Patient needs continued PT services  PT Problem List Decreased strength;Decreased mobility;Decreased activity tolerance;Decreased balance;Decreased skin integrity       PT Treatment Interventions DME instruction;Therapeutic exercise;Gait training;Balance training;Stair training;Functional  mobility training;Therapeutic activities;Patient/family education;Neuromuscular re-education    PT Goals (Current goals can be found in the Care Plan section)  Acute Rehab PT Goals Patient Stated Goal: to do as much as I can PT Goal Formulation: With patient Time For Goal Achievement: 10/27/19 Potential to Achieve Goals: Good    Frequency Min 2X/week   Barriers to discharge        Co-evaluation               AM-PAC PT "6 Clicks" Mobility  Outcome Measure Help needed turning from your back to your side while in a flat bed without using bedrails?: A Little Help needed moving from lying on your back to sitting on the side of a flat bed without using bedrails?: A Little Help needed moving to and from a bed to a chair (including a wheelchair)?: A Lot Help needed standing up from a chair using your arms (e.g., wheelchair or bedside chair)?: A Little Help needed to walk in hospital room?: A Lot Help needed climbing 3-5 steps with a railing? : A Lot 6 Click Score: 15    End of Session Equipment Utilized During Treatment: Gait belt Activity Tolerance: Patient limited by fatigue Patient left: in bed;with bed alarm set;with call bell/phone within reach;with family/visitor present Nurse Communication: Mobility status PT Visit Diagnosis: Other abnormalities of gait and mobility (R26.89);Muscle weakness (generalized) (M62.81);Unsteadiness on feet (R26.81)    Time: 1510-1555 PT Time Calculation (min) (ACUTE ONLY): 45 min   Charges:   PT Evaluation $PT  Eval High Complexity: 1 High PT Treatments $Self Care/Home Management: 8-22        Petra Kuba, PT, DPT 10/13/19, 4:39 PM

## 2019-10-13 NOTE — Progress Notes (Addendum)
PROGRESS NOTE    Vincent Poole  PYP:[id number] DOB: [birth year] DOA: 10/05/2019 PCP: [name], MD   Brief Narrative:  [name] a 84 y.o.malewith medical history significant forlocally-metastatic bladder cancer on chemotherapy, urinary tract obstruction with b/l nephrostomy tubes in place, recurrent UTI, htn, colostomy in place, provoked dvt s/p anticoagulation, who presents with a red "lump" on his lower abdomen in the suprapubic region for several months.  This was noted at recent hospitalization, thought to be 2/2 metastatic bladder cancer. Patient reports that since discharge has had occasional fever,  Lives at home with wife. Mostly bed-bound.  ED Course:manual expression of, per ED provider, approximately 30 cc pus from suprapubic wound. Keflex and metronidazole given. Labs and CT abdomen/pelvis ordered.  11/30: Patient's been seen by general surgery, case discussed with urology, recommending against aggressive invasive procedures as this is secondary to significant bladder cancer.  Dr. [name] also on board indicates patient and family has been resistant to hospice despite dismal prognosis.  12/1: Patients wife at bedside.  Patient apparently transported to radiation therapy this morning.  Endorses tenderness near abd wall abscess site.  Surgery signed off case  12/2: No acute status changes.  No family at bedside.  Patient reports mild improvement in abd pain near abscess site.  Possible plan for radiation therapy today  12/3: No family present at bedside.  Spoke at length with daughter at bedside yesterday.  Explained that there was no clear end point for the antibiotics and their efficacy in this case was greatly in question.  Offered palliative care consult, daughter will discuss with family and come to decision.  Patient overall stable, getting transported daily to radiation treatments.  12/4: Patient's wife at bedside.  Patient in good spirits with no  needed for clinical improvement.  Explained we do not have a clear endpoint insight for the broad-spectrum antibiotics.  Patient's wife understands and agrees.  12/5: No status changes.  Remains on broad spectrum antibiotics.  No family members present at time of my evaluation today  12/6: No acute status changes.  White count remains markedly elevated.  Remains on broad-spectrum antibiotics.  Wife present at bedside today.  Updated on plan of care.  Pending palliative care consultation on Monday 12/ 7.  Repeat CT scan today.  Slight increase in tumor size noted.  Appearance of abscess overall unchanged  Assessment & Plan:   Principal Problem:   Intra-abdominal infection Active Problems:   Sepsis ([place])   Essential hypertension   Prostate cancer ([place])   Urinary obstruction   Malignant neoplasm of urinary bladder ([facility])   Pressure injury of skin   S/P partial resection of colon   Intra-abdominal abscess ([facility])   Abscess with sepsis  - secondary to locally metastatic bladder cancer concerning for erosion through the anterior abdominal wall.  -Will continue with current broad spectrum antibiotic regimen (vanco, cefepime, flagyl) though utility of antibiotics at this point is greatly in question.  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 -Continue local wound care, antibiotics as above -Blood cultures negative to date - WBC remains high and trending up -Had a lengthy discussion with the patient's wife at bedside 12/4 regarding involvement of palliative care.  I explained the palliative care is not a de-escalation of care but rather a shifting the focus towards more symptom management.  Patient's wife expressed understanding and agrees to palliative care consultation.  I have reached  out to the palliative care service who agreed to consult on the patient.  The consultation is anticipated on 12/7 -The patient does not clinically improving and overall prognosis remains poor.  Will reconsult wound and ostomy nurse for reevaluation of abscess cavity and drainage.  Repeat CT abdomen pelvis to assess for progression of disease and any possible intervenable lesions - Repeat CT shows slight increase in size of bladder neoplasm with evidence of anterior abdominal wall invasion.  Abscess collection anterior to bladder communicates with bladder lumen, otherwise unchanged from prior    Metastatic bladder cancer  - on padcev, last dose earlier this month per wife, next dose due next month.  -Plan to start radiation therapy on 11/30 but this was canceled secondary to infection - Patient was transported to radiation therapy this morning, wish to continue at this time -Oncology aware, patient and family resistant to hospice, poor prognosis.  Recommend palliative care consultation - See above  Acute on chronic leukocytosis  -Reviewed medical history patient appears to be have a chronic leukocytosis -Of note it was normal approximately 1 year ago, patient is being followed by oncology -Likely worse because of superimposed infection -We will follow-up cultures as above continue antibiotics  bilateral nephrostomy tubes  - 2/2 hx bladder obstruction - ostomy care - cont home myrbetric - Nephrostomy care per nursing  Colostomy - outpt per wife and patient is normal - ostomy care  stage 1 sacral decub POA - wound care  DVT prophylaxis: SCD/Compression stockings  Code Status: DNR    Code Status Orders  (From admission, onward)         Start     Ordered   10/05/19 1925  Do not attempt resuscitation (DNR)  Continuous    Question Answer Comment  In the event of cardiac or respiratory ARREST Do not call a code blue   In the event of cardiac or respiratory ARREST Do not  perform Intubation, CPR, defibrillation or ACLS   In the event of cardiac or respiratory ARREST Use medication by any route, position, wound care, and other measures to relive pain and suffering. May use oxygen, suction and manual treatment of airway obstruction as needed for comfort.      10/05/19 1924        Code Status History    Date Active Date Inactive Code Status Order ID Comments User Context   09/25/2019 2326 09/28/2019 1718 DNR [id number]  Rise Patience, MD ED   09/25/2019 2206 09/25/2019 2326 DNR [id number]  Merlyn Lot, MD ED   06/07/2019 1651 06/12/2019 1937 DNR [id number]  Basilio Cairo, NP Inpatient   06/07/2019 0103 06/07/2019 1651 Full Code [id number]  Mayer Camel, NP Inpatient   04/26/2019 0420 04/29/2019 1913 Full Code [id number]  Mansy, Arvella Merles, MD ED   04/08/2019 1211 04/10/2019 1628 Full Code [id number]  Gladstone Lighter, MD ED   03/22/2019 2337 03/26/2019 1934 Full Code [id number]  Lance Coon, MD Inpatient   02/21/2019 1505 02/26/2019 1849 Full Code [id number]  Mayo, Pete Pelt, MD Inpatient   11/12/2018 1849 11/16/2018 1632 Full Code [id number]  Epifanio Lesches, MD Inpatient   05/01/2018 1702 05/06/2018 1711 Full Code [id number]  Dustin Flock, MD Inpatient   12/26/2017 2141 12/30/2017 1727 Full Code [id number]  Demetrios Loll, MD Inpatient   05/31/2017 0208 06/01/2017 1922 Full Code [id number]  [place], Prosperity, DO Inpatient   03/04/2017 1833 03/05/2017 1426  Full Code [id number]  Idelle Crouch, MD Inpatient   07/11/2016 0538 07/13/2016 1420 Full Code [id number]  Saundra Shelling, MD ED   Advance Care Planning Activity     Family Communication: Discussed POC with wife at bedside 12/6 Disposition Plan:   Patient remains in the hospital for IV antibiotics. Follow-up cultures, expert subspecialty consultation . Patient not medically ready or stable for discharge.  Palliative care consultation pending Consults called: Palliative care Admission status: Inpatient   Consultants:    GEN SURGERY- signed off  Palliative care- pending  Procedures:  Ct Head Wo Contrast  Result Date: 09/25/2019 CLINICAL DATA:  Seizure EXAM: CT HEAD WITHOUT CONTRAST TECHNIQUE: Contiguous axial images were obtained from the base of the skull through the vertex without intravenous contrast. COMPARISON:  None. FINDINGS: Brain: There is atrophy and chronic small vessel disease changes. No acute intracranial abnormality. Specifically, no hemorrhage, hydrocephalus, mass lesion, acute infarction, or significant intracranial injury. Vascular: No hyperdense vessel or unexpected calcification. Skull: No acute calvarial abnormality. Sinuses/Orbits: Visualized paranasal sinuses and mastoids clear. Orbital soft tissues unremarkable. Other: None IMPRESSION: Atrophy, chronic microvascular disease. No acute intracranial abnormality. Electronically Signed   By: Rolm Baptise M.D.   On: 09/25/2019 20:42   Ct Abdomen Pelvis W Contrast  Result Date: 10/05/2019 CLINICAL DATA:  84 year old male with history of lower abdominal pain from an abscess. EXAM: CT ABDOMEN AND PELVIS WITH CONTRAST TECHNIQUE: Multidetector CT imaging of the abdomen and pelvis was performed using the standard protocol following bolus administration of intravenous contrast. CONTRAST:  154mL OMNIPAQUE IOHEXOL 300 MG/ML  SOLN COMPARISON:  CT the abdomen and pelvis 09/25/2019. FINDINGS: Lower chest: Small right pleural effusion lying dependently. Areas of subsegmental atelectasis in the lower lobes of the lungs bilaterally. Atherosclerotic calcifications in the descending thoracic aorta as well as the left anterior descending, left circumflex and right coronary arteries. Hepatobiliary: No suspicious cystic or solid hepatic lesions. No intra or extrahepatic biliary ductal dilatation. Status post cholecystectomy. Pancreas: No pancreatic mass. No pancreatic ductal dilatation. No pancreatic or peripancreatic fluid collections or inflammatory changes. Spleen:  Unremarkable. Adrenals/Urinary Tract: Percutaneous nephrostomy tubes are noted with tips reformed in the renal collecting systems bilaterally. 2.8 cm low-attenuation lesion in the anterior aspect of the interpolar region of the right kidney, compatible with a simple cyst. Subcentimeter low-attenuation lesion in the anterior aspect of the lower pole the left kidney, too small to characterize, but statistically likely to represent a tiny cyst. No hydroureteronephrosis. Urinary bladder is enlarged measuring approximately 8.6 x 14.5 x 8.8 cm and grossly distorted with extensive multifocal mural thickening and widespread areas of internal soft tissue thickening and enhancing soft tissue, with a combination of fluid and gas within the bladder, concerning for progressively enlarging bladder neoplasm. This appears to extend through the lower anterior abdominal wall where there is a small gas and fluid collection in the subcutaneous fat immediately superficial to the anterior abdominal wall musculature measuring 8.0 x 1.7 x 5.0 cm (axial image 68 of series 2 and sagittal image 64 of series 6). Bilateral adrenal glands are normal in appearance. Stomach/Bowel: Normal appearance of the stomach. No pathologic dilatation of small bowel or colon. A few scattered colonic diverticulae are noted, without surrounding inflammatory changes to suggest an acute diverticulitis at this time. Mid transverse colon loop colostomy. Normal appendix. Vascular/Lymphatic: Aortic atherosclerosis, without evidence of aneurysm or dissection in the abdominal or pelvic vasculature. No lymphadenopathy noted in the abdomen or pelvis. Reproductive: Prostate gland is completely obscured by  the adjacent bladder mass, likely from direct invasion. Seminal vesicles are not confidently identified. Other: No significant volume of ascites.  No pneumoperitoneum. Musculoskeletal: There are no aggressive appearing lytic or blastic lesions noted in the visualized  portions of the skeleton. IMPRESSION: 1. Progressively enlarging heterogeneously enhancing bladder mass which now appears to have eroded through the inferior aspect of the anterior abdominal wall musculature, with small superficial abscess just deep to the skin surface in the low anatomic pelvis. 2. Bilateral nephrostomy tubes are stable in position. No hydroureteronephrosis. 3. Small right pleural effusion lying dependently. 4. Aortic atherosclerosis, in addition to least 3 vessel coronary artery disease. 5. Additional incidental findings, as above, similar to prior studies. Electronically Signed   By: Vinnie Langton M.D.   On: 10/05/2019 18:10   Ct Abdomen Pelvis W Contrast  Result Date: 09/25/2019 CLINICAL DATA:  Acute abdominal pain. Fever. EXAM: CT ABDOMEN AND PELVIS WITH CONTRAST TECHNIQUE: Multidetector CT imaging of the abdomen and pelvis was performed using the standard protocol following bolus administration of intravenous contrast. CONTRAST:  175mL OMNIPAQUE IOHEXOL 300 MG/ML  SOLN COMPARISON:  09/09/2019 FINDINGS: Lower chest: Aortic atherosclerosis. Extensive coronary artery calcifications. Heart size is normal. No pericardial effusion. Tiny bilateral pleural effusions with minimal bibasilar atelectasis. Hepatobiliary: No focal liver abnormality is seen. Status post cholecystectomy. No biliary dilatation. Pancreas: Unremarkable. No pancreatic ductal dilatation or surrounding inflammatory changes. Spleen: Normal in size without focal abnormality. Adrenals/Urinary Tract: Adrenal glands are normal. Bilateral nephrostomy tubes in place. Slight dilatation of the right renal pelvis and proximal right ureter. 21 mm simple cyst on the mid right kidney. Again noted is a large irregular inhomogeneous mass in the bladder which appears larger than on the prior study. There are irregular fluid collections containing gas in that region consistent with infection. Gas collection protrudes through the midline of  the anterior abdominal wall as demonstrated on the prior exam. Stomach/Bowel: Large amount of stool in the rectum consistent with a fecal impaction. Colostomy in the mid transverse colon. Appendix is normal. No significant change since the prior study. Vascular/Lymphatic: Aortic atherosclerosis. No enlarged abdominal or pelvic lymph nodes. Reproductive: Prostate gland is not identified. Other: No ascites. Musculoskeletal: No acute abnormality. Severe arthritic changes of both hips, right greater than left. IMPRESSION: 1. Interval enlargement of the large irregular inhomogeneous mass in the bladder consistent with the patient's known bladder carcinoma. 2. Large amount of stool in the rectum consistent with fecal impaction, unchanged. 3. Bilateral nephrostomy tubes in place. Slight dilatation of the right renal pelvis and proximal right ureter. 4. Tiny bilateral pleural effusions with minimal bibasilar atelectasis. 5. Severe arthritic changes of both hips, right greater than left. 6. Aortic Atherosclerosis (ICD10-I70.0). Electronically Signed   By: Lorriane Shire M.D.   On: 09/25/2019 20:47   Dg Chest Portable 1 View  Result Date: 09/25/2019 CLINICAL DATA:  Sepsis, mental status decline. EXAM: PORTABLE CHEST 1 VIEW COMPARISON:  Chest CT 08/08/2019 FINDINGS: Power injectable right Port-A-Cath noted, tip projecting over the SVC. Mild bilateral interstitial accentuation in both lungs with some hazy density in the infrahilar regions potentially from atypical pneumonia or noncardiogenic edema. No cardiomegaly. Atherosclerotic calcification of the aortic arch. No blunting of the costophrenic angles. IMPRESSION: 1. Hazy infrahilar airspace opacity in the lungs bilaterally, potentially from atypical pneumonia or noncardiogenic edema. 2. Atherosclerotic calcification of the aortic arch. 3. Power injectable right Port-A-Cath tip: SVC. Electronically Signed   By: Van Clines M.D.   On: 09/25/2019 20:19  Antimicrobials:   Vancomycin, cefepime and Flagyl   Subjective: No acute events overnight No new complaints Awake in bed, appears in no distress Vital stable  Objective: Vitals:   10/12/19 0829 10/12/19 1304 10/12/19 2234 10/13/19 0526  BP:  (!) 121/58 109/65 106/61  Pulse: 89 (!) 101 98 100  Resp:  18 17 18   Temp:  97.7 F (36.5 C) (!) 97.4 F (36.3 C) 97.7 F (36.5 C)  TempSrc:  Oral Oral Oral  SpO2:  100% 97% 97%  Weight:      Height:        Intake/Output Summary (Last 24 hours) at 10/13/2019 1052 Last data filed at 10/13/2019 1029 Gross per 24 hour  Intake 4610.05 ml  Output 2830 ml  Net 1780.05 ml   Filed Weights   10/05/19 1418 10/05/19 2139  Weight: 65.8 kg 66.6 kg    Examination:  General exam: Appears calm and comfortable  Respiratory system: Clear to auscultation. Respiratory effort normal. Cardiovascular system: S1 & S2 heard, RRR. No JVD, murmurs, rubs, gallops or clicks. No pedal edema. Gastrointestinal system: Abdomen with colostomy bag no output no signs of infection, post I&D wound on abdominal wall no evidence of bleeding or exuding purulence through dressing at this point time, did not take dressing down Central nervous system: Alert,  Extremities: Warm well perfused  skin: Lesions abdominal wall as above otherwise no rashes, lesions or ulcers Psychiatry: Judgement and insight are poor. Mood & affect flat    Data Reviewed: I have personally reviewed following labs and imaging studies  CBC: Recent Labs  Lab 10/08/19 0637 10/09/19 0406 10/10/19 0623 10/11/19 0504 10/13/19 0446  WBC 61.2* 58.7* 64.1* 72.7* 72.7*  NEUTROABS 51.5* 49.4* 53.9* 61.9* 61.8*  HGB 10.2* 10.0* 10.3* 10.2* 10.3*  HCT 31.6* 31.6* 33.7* 32.4* 32.7*  MCV 85.9 89.0 91.1 89.8 89.6  PLT 380 371 402* 408* 935*   Basic Metabolic Panel: Recent Labs  Lab 10/08/19 0637 10/09/19 0406 10/10/19 0623 10/11/19 0504 10/12/19 0456 10/13/19 0446  NA 136 137 137 137  139 139  K 2.9* 3.2* 4.6 3.8 3.8 3.3*  CL 109 109 110 109 110 107  CO2 17* 21* 19* 20* 21* 21*  GLUCOSE 122* 119* 108* 88 106* 50*  BUN 10 10 9 10 10 10   CREATININE 0.82 0.85 1.02 0.95 1.16 1.02  CALCIUM 8.1* 8.2* 8.3* 8.3* 8.5* 8.3*  MG 1.7 2.2 2.0  --   --   --   PHOS  --   --  2.5  --   --   --    GFR: Estimated Creatinine Clearance: 52.6 mL/min (by C-G formula based on SCr of 1.02 mg/dL). Liver Function Tests: No results for input(s): AST, ALT, ALKPHOS, BILITOT, PROT, ALBUMIN in the last 168 hours. No results for input(s): LIPASE, AMYLASE in the last 168 hours. No results for input(s): AMMONIA in the last 168 hours. Coagulation Profile: No results for input(s): INR, PROTIME in the last 168 hours. Cardiac Enzymes: No results for input(s): CKTOTAL, CKMB, CKMBINDEX, TROPONINI in the last 168 hours. BNP (last 3 results) No results for input(s): PROBNP in the last 8760 hours. HbA1C: No results for input(s): HGBA1C in the last 72 hours. CBG: No results for input(s): GLUCAP in the last 168 hours. Lipid Profile: No results for input(s): CHOL, HDL, LDLCALC, TRIG, CHOLHDL, LDLDIRECT in the last 72 hours. Thyroid Function Tests: No results for input(s): TSH, T4TOTAL, FREET4, T3FREE, THYROIDAB in the last 72 hours. Anemia Panel: No  results for input(s): VITAMINB12, FOLATE, FERRITIN, TIBC, IRON, RETICCTPCT in the last 72 hours. Sepsis Labs: No results for input(s): PROCALCITON, LATICACIDVEN in the last 168 hours.  Recent Results (from the past 240 hour(s))  Blood Culture (routine x 2)     Status: None   Collection Time: 10/05/19  4:18 PM   Specimen: BLOOD  Result Value Ref Range Status   Specimen Description BLOOD BLOOD RIGHT WRIST  Final   Special Requests   Final    BOTTLES DRAWN AEROBIC AND ANAEROBIC Blood Culture adequate volume   Culture   Final    NO GROWTH 5 DAYS Performed at [facility], [address] [place], [place] [address]    Report Status 10/10/2019  FINAL  Final  Blood Culture (routine x 2)     Status: None   Collection Time: 10/05/19  4:18 PM   Specimen: BLOOD  Result Value Ref Range Status   Specimen Description BLOOD BLOOD LEFT WRIST  Final   Special Requests   Final    BOTTLES DRAWN AEROBIC AND ANAEROBIC Blood Culture results may not be optimal due to an inadequate volume of blood received in culture bottles   Culture   Final    NO GROWTH 5 DAYS Performed at [facility], [address] [place], [place] [address]    Report Status 10/10/2019 FINAL  Final  Urine culture     Status: Abnormal   Collection Time: 10/05/19  4:18 PM   Specimen: In/Out Cath Urine  Result Value Ref Range Status   Specimen Description   Final    IN/OUT CATH URINE Performed at [facility], [address], [place], [place] [address]    Special Requests   Final    NONE Performed at [facility], [address] [place], [place] [address]    Culture MULTIPLE SPECIES PRESENT, SUGGEST RECOLLECTION (A)  Final   Report Status 10/07/2019 FINAL  Final  SARS CORONAVIRUS 2 (TAT 6-24 HRS) Nasopharyngeal Nasopharyngeal Swab     Status: None   Collection Time: 10/05/19  4:18 PM   Specimen: Nasopharyngeal Swab  Result Value Ref Range Status   SARS Coronavirus 2 NEGATIVE NEGATIVE Final    Comment: (NOTE) SARS-CoV-2 target nucleic acids are NOT DETECTED. The SARS-CoV-2 RNA is generally detectable in upper and lower respiratory specimens during the acute phase of infection. Negative results do not preclude SARS-CoV-2 infection, do not rule out co-infections with other pathogens, and should not be used as the sole basis for treatment or other patient management decisions. Negative results must be combined with clinical observations, patient history, and epidemiological information. The expected result is Negative. Fact Sheet for Patients: SugarRoll.be Fact Sheet for Healthcare  Providers: https://www.woods-mathews.com/ This test is not yet approved or cleared by the [place] FDA and  has been authorized for detection and/or diagnosis of SARS-CoV-2 by FDA under an Emergency Use Authorization (EUA). This EUA will remain  in effect (meaning this test can be used) for the duration of the COVID-19 declaration under Section 56 4(b)(1) of the Act, 21 U.S.C. section 360bbb-3(b)(1), unless the authorization is terminated or revoked sooner. Performed at [facility], [place] [address] [place], [place] [address]          Radiology Studies: No results found.      Scheduled Meds:  mirabegron ER  50 mg Oral Daily   potassium chloride  40 mEq Oral Once   sodium chloride flush  5 mL Intravenous Q8H  Continuous Infusions:  sodium chloride Stopped (10/13/19 0231)   ceFEPime (MAXIPIME) IV Stopped (10/12/19 2331)   dextrose 5 % and 0.45% NaCl 100 mL/hr at 10/13/19 1021   metronidazole 500 mg (10/13/19 0930)   vancomycin Stopped (10/12/19 2232)     LOS: 8 days    Time spent: 35 min    Sidney Ace, MD [facility]  If 7PM-7AM, please contact night-coverage  10/13/2019, 10:52 AM

## 2019-10-13 NOTE — Consult Note (Signed)
Pharmacy Antibiotic Note  Vincent Poole is a 84 y.o. male admitted on 10/05/2019 with pneumonia and intra-abdominal infection.  Pharmacy was consulted for Vancomycin and Cefepime dosing. Patient suffering from drainage from abdomen. 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 This is day # 7 of IV antibiotics, with continued profound leukocytosis that has not improved although without any recent fevers. His SCr is stable from yesterday  Dose given 12/6 @ 2104  Vanc Peak 12/6 @ 0010: 34   Plan: 1) continue vancomycin 1250 mg IV Q 24 hrs Goal AUC 400-550 Expected AUC: 452 Expected Css: 34.7/10.3 mcg/mL T1/2: 12.9h SCr used: 0.95   Vanc trough scheduled for 12/6 @ 2000   2) continue cefepime 2g IV 12Q hours  Height: 5\' 11"  (180.3 cm) Weight: 146 lb 13.2 oz (66.6 kg) IBW/kg (Calculated) : 75.3  Temp (24hrs), Avg:97.7 F (36.5 C), Min:97.4 F (36.3 C), Max:98.1 F (36.7 C)  Recent Labs  Lab 10/07/19 0625 10/08/19 0637 10/09/19 0406 10/10/19 0623 10/11/19 0504 10/12/19 0456 10/13/19 0010  WBC 49.5* 61.2* 58.7* 64.1* 72.7*  --   --   CREATININE 0.88 0.82 0.85 1.02 0.95 1.16  --   VANCOPEAK  --   --   --   --   --   --  34    Estimated Creatinine Clearance: 46.3 mL/min (by C-G formula based on SCr of 1.16 mg/dL).    No Known Allergies  Antimicrobials this admission: Vancomycin 11/28 >>  Cefepime 11/28 >>  Metronidazole 11/28 >> Rocephin 11/28 x1  Microbiology results: 11/28 BCx: NGF 11/28 UCx: contaminated 11/28 SARS CoV-2 negative  Thank you for allowing pharmacy to be a part of this patient's care.  Zianne Schubring M Jax Abdelrahman 10/13/2019 1:18 AM

## 2019-10-14 ENCOUNTER — Encounter: Payer: Self-pay | Admitting: Internal Medicine

## 2019-10-14 ENCOUNTER — Ambulatory Visit
Admission: RE | Admit: 2019-10-14 | Discharge: 2019-10-14 | Disposition: A | Discharge status: Home / Self Care | Payer: Medicare Other | Source: Ambulatory Visit | Attending: Radiation Oncology | Admitting: Radiation Oncology

## 2019-10-14 DIAGNOSIS — Z515 Encounter for palliative care: Secondary | ICD-10-CM

## 2019-10-14 DIAGNOSIS — A419 Sepsis, unspecified organism: Principal | ICD-10-CM

## 2019-10-14 DIAGNOSIS — C7911 Secondary malignant neoplasm of bladder: Secondary | ICD-10-CM

## 2019-10-14 LAB — CBC WITH DIFFERENTIAL/PLATELET
Abs Immature Granulocytes: 3.53 10*3/uL — ABNORMAL HIGH (ref 0.00–0.07)
Basophils Absolute: 0.1 10*3/uL (ref 0.0–0.1)
Basophils Relative: 0 %
Eosinophils Absolute: 0.8 10*3/uL — ABNORMAL HIGH (ref 0.0–0.5)
Eosinophils Relative: 1 %
HCT: 32.1 % — ABNORMAL LOW (ref 39.0–52.0)
Hemoglobin: 10.1 g/dL — ABNORMAL LOW (ref 13.0–17.0)
Immature Granulocytes: 5 %
Lymphocytes Relative: 5 %
Lymphs Abs: 3.6 10*3/uL (ref 0.7–4.0)
MCH: 28.7 pg (ref 26.0–34.0)
MCHC: 31.5 g/dL (ref 30.0–36.0)
MCV: 91.2 fL (ref 80.0–100.0)
Monocytes Absolute: 2.8 10*3/uL — ABNORMAL HIGH (ref 0.1–1.0)
Monocytes Relative: 4 %
Neutro Abs: 57.9 10*3/uL — ABNORMAL HIGH (ref 1.7–7.7)
Neutrophils Relative %: 85 %
Platelets: 435 10*3/uL — ABNORMAL HIGH (ref 150–400)
RBC: 3.52 MIL/uL — ABNORMAL LOW (ref 4.22–5.81)
RDW: 19.2 % — ABNORMAL HIGH (ref 11.5–15.5)
Smear Review: NORMAL
WBC: 68.8 10*3/uL (ref 4.0–10.5)
nRBC: 0 % (ref 0.0–0.2)

## 2019-10-14 LAB — BASIC METABOLIC PANEL
Anion gap: 7 (ref 5–15)
BUN: 9 mg/dL (ref 8–23)
CO2: 22 mmol/L (ref 22–32)
Calcium: 8.1 mg/dL — ABNORMAL LOW (ref 8.9–10.3)
Chloride: 112 mmol/L — ABNORMAL HIGH (ref 98–111)
Creatinine, Ser: 0.91 mg/dL (ref 0.61–1.24)
GFR calc Af Amer: >60 mL/min (ref >60)
GFR calc non Af Amer: >60 mL/min (ref >60)
Glucose, Bld: 103 mg/dL — ABNORMAL HIGH (ref 70–99)
Potassium: 3.7 mmol/L (ref 3.5–5.1)
Sodium: 141 mmol/L (ref 135–145)

## 2019-10-14 LAB — MAGNESIUM: Magnesium: 1.7 mg/dL (ref 1.7–2.4)

## 2019-10-14 MED ORDER — ENSURE ENLIVE PO LIQD
237.0000 mL | Freq: Three times a day (TID) | ORAL | Status: DC
  Administered 2019-10-14 – 2019-10-29 (×35): 237 mL via ORAL

## 2019-10-14 MED ORDER — ENOXAPARIN SODIUM 40 MG/0.4ML ~~LOC~~ SOLN
40.0000 mg | SUBCUTANEOUS | Status: DC
  Administered 2019-10-14 – 2019-10-28 (×15): 40 mg via SUBCUTANEOUS
  Filled 2019-10-14 (×15): qty 0.4

## 2019-10-14 NOTE — Care Management Important Message (Signed)
Important Message  Patient Details  Name: Vincent Poole MRN: [id number] Date of Birth: October 16, 1935   Medicare Important Message Given:  Yes     Dannette Barbara 10/14/2019, 12:27 PM

## 2019-10-14 NOTE — Progress Notes (Signed)
PT Cancellation Note  Patient Details Name: Vincent Poole MRN: [id number] DOB: [birth year]   Cancelled Treatment:    Reason Eval/Treat Not Completed: Fatigue/lethargy limiting ability to participate. Upon arrival with OT, for PT/OT cotreat pt sleeping with wife present. Wife requesting to let him rest and come back later because he is very tired from cancer treatment and not feeling well. PT will follow up as able and pt appropriate.   Zachary George PT, DPT 2:45 PM,10/14/19 [phone]   Deiona Hooper Drucilla Chalet 10/14/2019, 2:44 PM

## 2019-10-14 NOTE — Consult Note (Signed)
[place] for Electrolyte Monitoring and Replacement   Recent Labs: Potassium (mmol/L)  Date Value  10/14/2019 3.7   Magnesium (mg/dL)  Date Value  10/10/2019 2.0   Calcium (mg/dL)  Date Value  10/14/2019 8.1 (L)   Albumin (g/dL)  Date Value  10/06/2019 2.4 (L)   Phosphorus (mg/dL)  Date Value  10/10/2019 2.5   Sodium (mmol/L)  Date Value  10/14/2019 141   Corrected Ca: 9.5 mg/dL  Assessment: 84 y.o.malewith medical history significant forlocally-metastatic bladder cancer on chemotherapy, urinary tract obstruction with b/l nephrostomy tubes in place, recurrent UTI, htn, colostomy in place, provoked dvt s/p anticoagulation, who presents with an abscess with sepsis - secondary to locally metastatic bladder cancerconcerning forerosionthrough the anterior abdominal wall.  Goal of Therapy:  Electrolytes WNL  Plan:   Continue D51/2NS at 100 mL/hr  BMP in AM.   Pearla Dubonnet ,PharmD Clinical Pharmacist 10/14/2019 7:37 AM

## 2019-10-14 NOTE — Progress Notes (Signed)
Progress Note    Vincent Poole  ZES:[id number] DOB: June 06, 1935  DOA: 10/05/2019 PCP: [name], MD      Brief Narrative:    Medical records reviewed and are as summarized below:  Vincent Poole is an 84 y.o. male with medical history significant forlocally-metastatic bladder cancer on chemotherapy, urinary tract obstruction with b/l nephrostomy tubes in place, recurrent UTI, hypertension, colostomy in place, provoked dvt s/p anticoagulation, who presents with a red "lump" on his lower abdomen in the suprapubic region for several months.  This was noted at recent hospitalization, thought to be 2/2 metastatic bladder cancer.       Assessment/Plan:   Principal Problem:   Intra-abdominal infection Active Problems:   Sepsis ([place])   Essential hypertension   Prostate cancer (HCC)   Urinary obstruction   Malignant neoplasm of urinary bladder (HCC)   Pressure injury of skin   S/P partial resection of colon   Intra-abdominal abscess ([facility])   Body mass index is 20.48 kg/m.    Sepsis secondary to abdominal wall abscess/significant leukocytosis: Continue empiric IV antibiotics.  Metastatic bladder cancer: Follow-up with oncologist.  Patient is on radiation therapy.  Patient was seen by palliative care team today.  Acute on chronic leukocytosis: Likely worsened by infection  Bilateral nephrostomy tubes: Continue nephrostomy care per nursing.  Stage I sacral decubitus ulcer: This was present on admission.  Continue local wound care.   Family Communication/Anticipated D/C date and plan/Code Status   DVT prophylaxis: SCDs, Lovenox Code Status: DNR Family Communication: Plan discussed with his wife at the bedside Disposition Plan: To be determined      Subjective:   No new complaints.  At the time of my visit, patient was being transferred to the cancer center for treatment.  Objective:    Vitals:   10/13/19 1608 10/13/19 2243 10/14/19 0602  10/14/19 1253  BP:  (!) 114/59 (!) 112/52 (!) 114/59  Pulse: (!) 106 (!) 102 (!) 101 (!) 109  Resp:  17 18 16   Temp:  97.9 F (36.6 C) 97.7 F (36.5 C) 97.9 F (36.6 C)  TempSrc:  Oral Oral Oral  SpO2: 96% 95% 97% 100%  Weight:      Height:        Intake/Output Summary (Last 24 hours) at 10/14/2019 1618 Last data filed at 10/14/2019 1048 Gross per 24 hour  Intake 2766.69 ml  Output 4890 ml  Net -2123.31 ml   Filed Weights   10/05/19 1418 10/05/19 2139  Weight: 65.8 kg 66.6 kg    Exam:  GEN: NAD SKIN: Abdominal wall wound without drainage EYES: No pallor or icterus ENT: MMM, hearing impairment CV: RRR PULM: CTA B ABD: soft, ND, NT, +BS, + colostomy bag with feces, + open drain  CNS: AAO x 3, non focal EXT: No edema or tenderness   Data Reviewed:   I have personally reviewed following labs and imaging studies:  Labs: Labs show the following:   Basic Metabolic Panel: Recent Labs  Lab 10/08/19 0637 10/09/19 0406 10/10/19 0623 10/11/19 0504 10/12/19 0456 10/13/19 0446 10/14/19 0638  NA 136 137 137 137 139 139 141  K 2.9* 3.2* 4.6 3.8 3.8 3.3* 3.7  CL 109 109 110 109 110 107 112*  CO2 17* 21* 19* 20* 21* 21* 22  GLUCOSE 122* 119* 108* 88 106* 50* 103*  BUN 10 10 9 10 10 10 9   CREATININE 0.82 0.85 1.02 0.95 1.16 1.02 0.91  CALCIUM 8.1* 8.2*  8.3* 8.3* 8.5* 8.3* 8.1*  MG 1.7 2.2 2.0  --   --   --  1.7  PHOS  --   --  2.5  --   --   --   --    GFR Estimated Creatinine Clearance: 59 mL/min (by C-G formula based on SCr of 0.91 mg/dL). Liver Function Tests: No results for input(s): AST, ALT, ALKPHOS, BILITOT, PROT, ALBUMIN in the last 168 hours. No results for input(s): LIPASE, AMYLASE in the last 168 hours. No results for input(s): AMMONIA in the last 168 hours. Coagulation profile No results for input(s): INR, PROTIME in the last 168 hours.  CBC: Recent Labs  Lab 10/09/19 0406 10/10/19 0623 10/11/19 0504 10/13/19 0446 10/14/19 0638  WBC 58.7*  64.1* 72.7* 72.7* 68.8*  NEUTROABS 49.4* 53.9* 61.9* 61.8* 57.9*  HGB 10.0* 10.3* 10.2* 10.3* 10.1*  HCT 31.6* 33.7* 32.4* 32.7* 32.1*  MCV 89.0 91.1 89.8 89.6 91.2  PLT 371 402* 408* 409* 435*   Cardiac Enzymes: No results for input(s): CKTOTAL, CKMB, CKMBINDEX, TROPONINI in the last 168 hours. BNP (last 3 results) No results for input(s): PROBNP in the last 8760 hours. CBG: No results for input(s): GLUCAP in the last 168 hours. D-Dimer: No results for input(s): DDIMER in the last 72 hours. Hgb A1c: No results for input(s): HGBA1C in the last 72 hours. Lipid Profile: No results for input(s): CHOL, HDL, LDLCALC, TRIG, CHOLHDL, LDLDIRECT in the last 72 hours. Thyroid function studies: No results for input(s): TSH, T4TOTAL, T3FREE, THYROIDAB in the last 72 hours.  Invalid input(s): FREET3 Anemia work up: No results for input(s): VITAMINB12, FOLATE, FERRITIN, TIBC, IRON, RETICCTPCT in the last 72 hours. Sepsis Labs: Recent Labs  Lab 10/10/19 0623 10/11/19 0504 10/13/19 0446 10/14/19 0638  WBC 64.1* 72.7* 72.7* 68.8*    Microbiology Recent Results (from the past 240 hour(s))  Blood Culture (routine x 2)     Status: None   Collection Time: 10/05/19  4:18 PM   Specimen: BLOOD  Result Value Ref Range Status   Specimen Description BLOOD BLOOD RIGHT WRIST  Final   Special Requests   Final    BOTTLES DRAWN AEROBIC AND ANAEROBIC Blood Culture adequate volume   Culture   Final    NO GROWTH 5 DAYS Performed at [facility], [address] [place], [place] [address]    Report Status 10/10/2019 FINAL  Final  Blood Culture (routine x 2)     Status: None   Collection Time: 10/05/19  4:18 PM   Specimen: BLOOD  Result Value Ref Range Status   Specimen Description BLOOD BLOOD LEFT WRIST  Final   Special Requests   Final    BOTTLES DRAWN AEROBIC AND ANAEROBIC Blood Culture results may not be optimal due to an inadequate volume of blood received in culture bottles    Culture   Final    NO GROWTH 5 DAYS Performed at [facility], [address], [place], [place] [address]    Report Status 10/10/2019 FINAL  Final  Urine culture     Status: Abnormal   Collection Time: 10/05/19  4:18 PM   Specimen: In/Out Cath Urine  Result Value Ref Range Status   Specimen Description   Final    IN/OUT CATH URINE Performed at [facility], [address], [place], [place] [address]    Special Requests   Final    NONE Performed at [facility], [address], [place], [place] [address]  Culture MULTIPLE SPECIES PRESENT, SUGGEST RECOLLECTION (A)  Final   Report Status 10/07/2019 FINAL  Final  SARS CORONAVIRUS 2 (TAT 6-24 HRS) Nasopharyngeal Nasopharyngeal Swab     Status: None   Collection Time: 10/05/19  4:18 PM   Specimen: Nasopharyngeal Swab  Result Value Ref Range Status   SARS Coronavirus 2 NEGATIVE NEGATIVE Final    Comment: (NOTE) SARS-CoV-2 target nucleic acids are NOT DETECTED. The SARS-CoV-2 RNA is generally detectable in upper and lower respiratory specimens during the acute phase of infection. Negative results do not preclude SARS-CoV-2 infection, do not rule out co-infections with other pathogens, and should not be used as the sole basis for treatment or other patient management decisions. Negative results must be combined with clinical observations, patient history, and epidemiological information. The expected result is Negative. Fact Sheet for Patients: SugarRoll.be Fact Sheet for Healthcare Providers: https://www.woods-mathews.com/ This test is not yet approved or cleared by the [place] FDA and  has been authorized for detection and/or diagnosis of SARS-CoV-2 by FDA under an Emergency Use Authorization (EUA). This EUA will remain  in effect (meaning this test can be used) for the duration of the COVID-19 declaration under Section 56 4(b)(1) of the Act, 21  U.S.C. section 360bbb-3(b)(1), unless the authorization is terminated or revoked sooner. Performed at [facility], [place] [address], [place], [place] [address]     Procedures and diagnostic studies:  Ct Abdomen Pelvis W Contrast  Result Date: 10/13/2019 CLINICAL DATA:  Abdominal pain, persistent leukocytosis, suspected abscess, re-evaluate anterior abdominal abscess; history of prostate cancer, locally invasive bladder cancer, hypertension, GERD EXAM: CT ABDOMEN AND PELVIS WITH CONTRAST TECHNIQUE: Multidetector CT imaging of the abdomen and pelvis was performed using the standard protocol following bolus administration of intravenous contrast. Sagittal and coronal MPR images reconstructed from axial data set. CONTRAST:  165mL OMNIPAQUE IOHEXOL 300 MG/ML SOLN IV. No oral contrast. COMPARISON:  10/05/2019 FINDINGS: Lower chest: Bibasilar small pleural effusions and compressive atelectasis of the lower lobes. Hepatobiliary: Gallbladder surgically absent.  Liver unremarkable. Pancreas: Atrophic without mass Spleen: Normal appearance Adrenals/Urinary Tract: Adrenal glands normal appearance. BILATERAL nephrostomy tubes. Mild BILATERAL ureteral dilatation greater on LEFT. Small BILATERAL renal cysts. Large enhancing mass arising from the bladder consistent with bladder neoplasm with invasion of the pre vesicle space and anterior abdominal wall, mass overall measuring 14.3 x 8.2 x 10.0 cm. Decreased air within the subcutaneous loculation, with this collection now clearly appearing to communicate with the bladder lumen rather than a separate loculated collection. Stomach/Bowel: Loop colostomy mid abdomen. Slightly increased stool in RIGHT colon and within rectum. Small bowel loops unremarkable. Stomach normal appearance. Normal appendix. Vascular/Lymphatic: Atherosclerotic calcifications aorta, iliac arteries, femoral arteries. Aorta normal caliber. No adenopathy. Reproductive: Minimal prostatic enlargement.  Several clips within the prostate gland with suspect prior TURP. Other: No free air or free fluid.  No hernia. Musculoskeletal: Bones demineralized. Advanced degenerative changes of both hips greater on RIGHT with prior proximal RIGHT femoral ORIF. IMPRESSION: Slight increase in size of bladder neoplasm since previous exam with evidence of invasion of the anterior abdominal wall. The previously identified abscess collection anterior to the bladder appears to communicate with the bladder lumen and demonstrates less air on the current study, otherwise unchanged. BILATERAL distal ureteral obstruction with BILATERAL nephrostomy tubes. Small BILATERAL pleural effusions and bibasilar atelectasis. Electronically Signed   By: Lavonia Dana M.D.   On: 10/13/2019 15:47    Medications:    feeding supplement (ENSURE ENLIVE)  237 mL  Oral TID BM   mirabegron ER  50 mg Oral Daily   sodium chloride flush  5 mL Intravenous Q8H   Continuous Infusions:  sodium chloride 15 mL/hr at 10/14/19 0200   ceFEPime (MAXIPIME) IV 2 g (10/14/19 0937)   dextrose 5 % and 0.45% NaCl 100 mL/hr at 10/14/19 0200   metronidazole 500 mg (10/14/19 1233)   vancomycin 1,250 mg (10/14/19 0941)     LOS: 9 days   Keaghan Bowens  [facility]   *Please refer to [email or web address], password [email or web address] to get updated schedule on who will round on this patient, as hospitalists switch teams weekly. If 7PM-7AM, please contact night-coverage at [email or web address], password [email or web address] for any overnight needs.  10/14/2019, 4:18 PM

## 2019-10-14 NOTE — Progress Notes (Signed)
OT Cancellation Note  Patient Details Name: Vincent Poole MRN: [id number] DOB: [birth year]   Cancelled Treatment:    Reason Eval/Treat Not Completed: Other (comment). Consult received, chart reviewed. Upon attempt, spouse in room. Pt/spouse noting that drains are full. Spoke to RN who reports she is going to come in momentarily to empty the drains just prior to pt's cancer treatment planned for 10:45am. Will hold OT evaluation this am and re-attempt this afternoon per pt/spouse's request to allow for rest after cancer treatment.   Jeni Salles, MPH, MS, OTR/L ascom [phone] 10/14/19, 10:28 AM

## 2019-10-14 NOTE — Consult Note (Signed)
Consultation Note Date: 10/14/2019   Patient Name: Vincent Poole  DOB: 1935/01/08  MRN: [id number]  Age / Sex: 84 y.o., male  PCP: [name], MD Referring Physician: Jennye Boroughs, MD  Reason for Consultation: Establishing goals of care  HPI/Patient Profile: Vincent Poole is a 84 y.o. male with medical history significant for locally-metastatic bladder cancer on chemotherapy, urinary tract obstruction with b/l nephrostomy tubes in place, recurrent UTI, htn, colostomy in place, provoked dvt s/p anticoagulation. Pt says there has been a red "lump" on his lower abdomen in the suprapubic region for several months. This was noted at recent hospitalization, thought to be 2/2 metastatic bladder cancer.   Clinical Assessment and Goals of Care: Patient is resting in bed and does not speak besides stating he has hip pain. Wife is at bedside. She discusses his chemo and radiation stating he still has around 11 radiation treatments, and does state it is palliative therapy. She states he usually is doing pretty well. She tells me the doctors are saying they can get them over this hump and she is optimistic. She is hopeful PT/OT will work with him in the morning. Will return tomorrow to speak with patient.        SUMMARY OF RECOMMENDATIONS   Will return tomorrow to continue conversations and speak with patient.     Prognosis:   Poor      Primary Diagnoses: Present on Admission: . Sepsis ([place]) . Essential hypertension . Prostate cancer ([place]) . Urinary obstruction . Malignant neoplasm of urinary bladder ([place]) . Pressure injury of skin   I have reviewed the medical record, interviewed the patient and family, and examined the patient. The following aspects are pertinent.  Past Medical History:  Diagnosis Date  . Anxiety   . Arthritis   . Bladder cancer ([place])   . Dysrhythmia 07/2018   history of atrial flutter that worsens with anxiety  . Femur fracture, right ([place]) 05/01/2018  . GERD (gastroesophageal reflux disease)   . History of recent blood transfusion 05/2018  . Hypertension   . Iron deficiency anemia 09/14/2018  . Prostate cancer ([place]) 07/2018   cancer growing in prostate but not prostate cancer, it is from the bladder  . Umbilical hernia 06/6577  . Urinary retention 2019   foley catheter place 11/2017  . UTI (urinary tract infection) 2019   frequent UTI's over last year  . Wound eschar of foot 07/2018   left heal getting wrapped and requiring antibiotic cream. cracks open with weight bearing.   Social History   Socioeconomic History  . Marital status: Married    Spouse name: ruth  . Number of children: Not on file  . Years of education: Not on file  . Highest education level: Not on file  Occupational History  . Occupation: retired    Comment: Therapist, [employer]  Social Needs  . Financial resource strain: Patient refused  . Food insecurity    Worry: Patient refused    Inability: Patient refused  . Transportation  needs    Medical: Patient refused    Non-medical: Patient refused  Tobacco Use  . Smoking status: Never Smoker  . Smokeless tobacco: Never Used  Substance and Sexual Activity  . Alcohol use: No    Alcohol/week: 0.0 standard drinks  . Drug use: No  . Sexual activity: Not Currently  Lifestyle  . Physical activity    Days per week: Patient refused    Minutes per session: Patient refused  . Stress: Patient refused  Relationships  . Social Herbalist on phone: Patient refused    Gets together: Patient refused    Attends religious service: Patient refused    Active member of club or organization: Patient refused    Attends meetings of clubs or organizations: Patient refused    Relationship status: Patient refused  Other Topics Concern  . Not on file  Social History Narrative  . Not on file   Family History  Problem  Relation Age of Onset  . Cancer Mother   . Chronic Renal Failure Mother   . Heart disease Father    Scheduled Meds: . feeding supplement (ENSURE ENLIVE)  237 mL Oral TID BM  . mirabegron ER  50 mg Oral Daily  . sodium chloride flush  5 mL Intravenous Q8H   Continuous Infusions: . sodium chloride 15 mL/hr at 10/14/19 0200  . ceFEPime (MAXIPIME) IV 2 g (10/14/19 0937)  . dextrose 5 % and 0.45% NaCl 100 mL/hr at 10/14/19 0200  . metronidazole 500 mg (10/14/19 1233)  . vancomycin 1,250 mg (10/14/19 0941)   PRN Meds:.sodium chloride, oxyCODONE Medications Prior to Admission:  Prior to Admission medications   Medication Sig Start Date End Date Taking? Authorizing Provider  acetaminophen (TYLENOL) 500 MG tablet Take 1,000 mg by mouth every 6 (six) hours as needed for moderate pain or headache.    Yes [provider]  cetirizine (ZYRTEC) 10 MG tablet Take 10 mg by mouth daily as needed for allergies.    Yes [provider]  Cranberry-Cholecalciferol (SUPER CRANBERRY/VITAMIN D3) 4200-500 MG-UNIT CAPS Take 1 capsule by mouth 2 (two) times daily.   Yes [provider]  dexamethasone (DECADRON) 4 MG tablet Take 2 tablets (8 mg total) by mouth daily. Start the day after chemotherapy for 2 days. 08/01/19  Yes Sindy Guadeloupe, MD  feeding supplement, ENSURE ENLIVE, (ENSURE ENLIVE) LIQD Take 237 mLs by mouth 2 (two) times daily between meals. 06/11/19  Yes Bettey Costa, MD  ferrous sulfate 325 (65 FE) MG tablet Take 1 tablet (325 mg total) by mouth 2 (two) times daily with a meal. 05/06/18  Yes Gouru, Aruna, MD  Glycerin-Hypromellose-PEG 400 (DRY EYE RELIEF DROPS OP) Apply 1 drop to eye daily as needed.    Yes [provider]  lidocaine-prilocaine (EMLA) cream Apply 1 application topically as needed. 01/02/19  Yes Sindy Guadeloupe, MD  Multiple Vitamin (MULTIVITAMIN WITH MINERALS) TABS tablet Take 1 tablet by mouth daily.   Yes [provider]  MYRBETRIQ 50 MG TB24  tablet TAKE 1 TABLET BY MOUTH DAILY Patient taking differently: Take 50 mg by mouth daily.  04/30/19  Yes McGowan, Larene [place] A, PA-C  nystatin [facility]) powder Apply topically 2 (two) times daily. Patient taking differently: Apply 1 g topically 2 (two) times daily as needed.  09/28/18  Yes McGowan, Larene Beach A, PA-C  ondansetron (ZOFRAN) 8 MG tablet Take 1 tablet (8 mg total) by mouth 2 (two) times daily as needed for refractory nausea /  vomiting. Start on day 3 after chemo. 08/01/19  Yes Sindy Guadeloupe, MD  oxyCODONE (OXY IR/ROXICODONE) 5 MG immediate release tablet Take 1 tablet (5 mg total) by mouth every 4 (four) hours as needed for severe pain. 03/21/19  Yes Sindy Guadeloupe, MD  polyethylene glycol powder (MIRALAX) powder Take 17 g by mouth daily as needed. Can increase to 3 times a day as needed for constipation but hold medication if has diarrhea 01/30/18  Yes Sindy Guadeloupe, MD  prochlorperazine (COMPAZINE) 10 MG tablet Take 1 tablet (10 mg total) by mouth every 6 (six) hours as needed (Nausea or vomiting). 08/01/19  Yes Sindy Guadeloupe, MD  vitamin B-12 (CYANOCOBALAMIN) 500 MCG tablet Take 500 mcg by mouth daily.   Yes [provider]  zinc oxide 20 % ointment Apply 1 application topically as needed for irritation.   Yes [provider]   No Known Allergies Review of Systems  Musculoskeletal:       Hip pain    Physical Exam Pulmonary:     Effort: Pulmonary effort is normal.  Neurological:     Mental Status: He is alert.     Vital Signs: BP (!) 114/59   Pulse (!) 109   Temp 97.9 F (36.6 C) (Oral)   Resp 16   Ht 5\' 11"  (1.803 m)   Wt 66.6 kg   SpO2 100%   BMI 20.48 kg/m  Pain Scale: 0-10 POSS *See Group Information*: S-Acceptable,Sleep, easy to arouse Pain Score: 0-No pain   SpO2: SpO2: 100 % O2 Device:SpO2: 100 % O2 Flow Rate: .   IO: Intake/output summary:   Intake/Output Summary (Last 24 hours) at 10/14/2019 1551 Last data filed at 10/14/2019 1048 Gross  per 24 hour  Intake 2766.69 ml  Output 4890 ml  Net -2123.31 ml    LBM: Last BM Date: 10/14/19 Baseline Weight: Weight: 65.8 kg Most recent weight: Weight: 66.6 kg     Palliative Assessment/Data:     Time In:3:30 Time Out: 4:00 Time Total:30 min Greater than 50%  of this time was spent counseling and coordinating care related to the above assessment and plan.  Signed by: Asencion Gowda, NP   Please contact Palliative Medicine Team phone at [phone] for questions and concerns.  For individual provider: See Shea Evans

## 2019-10-14 NOTE — Consult Note (Signed)
[facility] Nurse Consult Note: Patient receiving care in [facility]. Reason for Consult: "Re-assessment of dressing over abdominal abscess" Wound type: Suprapubic area has a small opening, that per notes, is due to erosion of an abscess through the abdominal wall, and metastatic cancer. Drainage (amount, consistency, odor) cloudy serous fluid in pouch Periwound: deferred Dressing procedure/placement/frequency: At some point, someone placed a urostomy pouch to the area to collect the drainage.  This is an appropriate method to collect the drainage.  Additional pouches are in the room.  No further needs identified.  Thank you for the consult.  Discussed plan of care with the patient and bedside nurse.  [facility] nurse will not follow at this time.  Please re-consult the [facility] team if needed.  Val Riles, RN, MSN, CWOCN, CNS-BC, pager [phone]

## 2019-10-14 NOTE — Progress Notes (Signed)
OT Cancellation Note  Patient Details Name: Vincent Poole MRN: [id number] DOB: 09-Nov-1934   Cancelled Treatment:    Reason Eval/Treat Not Completed: Fatigue/lethargy limiting ability to participate. On 2nd attempt for OT evaluation after lunch, pt sleeping soundly. Wife politely requests that OT/PT allow pt to sleep and re-attempt at later time. Will re-attempt next date.  Jeni Salles, MPH, MS, OTR/L ascom [phone] 10/14/19, 2:46 PM

## 2019-10-15 ENCOUNTER — Ambulatory Visit
Admission: RE | Admit: 2019-10-15 | Discharge: 2019-10-15 | Disposition: A | Discharge status: Home / Self Care | Payer: Medicare Other | Source: Ambulatory Visit | Attending: Radiation Oncology | Admitting: Radiation Oncology

## 2019-10-15 DIAGNOSIS — K651 Peritoneal abscess: Secondary | ICD-10-CM

## 2019-10-15 LAB — CBC WITH DIFFERENTIAL/PLATELET
Abs Immature Granulocytes: 3.45 10*3/uL — ABNORMAL HIGH (ref 0.00–0.07)
Basophils Absolute: 0.1 10*3/uL (ref 0.0–0.1)
Basophils Relative: 0 %
Eosinophils Absolute: 0.8 10*3/uL — ABNORMAL HIGH (ref 0.0–0.5)
Eosinophils Relative: 1 %
HCT: 32.5 % — ABNORMAL LOW (ref 39.0–52.0)
Hemoglobin: 10.3 g/dL — ABNORMAL LOW (ref 13.0–17.0)
Immature Granulocytes: 5 %
Lymphocytes Relative: 6 %
Lymphs Abs: 4.2 10*3/uL — ABNORMAL HIGH (ref 0.7–4.0)
MCH: 28.1 pg (ref 26.0–34.0)
MCHC: 31.7 g/dL (ref 30.0–36.0)
MCV: 88.8 fL (ref 80.0–100.0)
Monocytes Absolute: 2.9 10*3/uL — ABNORMAL HIGH (ref 0.1–1.0)
Monocytes Relative: 4 %
Neutro Abs: 62.4 10*3/uL — ABNORMAL HIGH (ref 1.7–7.7)
Neutrophils Relative %: 84 %
Platelets: 415 10*3/uL — ABNORMAL HIGH (ref 150–400)
RBC: 3.66 MIL/uL — ABNORMAL LOW (ref 4.22–5.81)
RDW: 19.4 % — ABNORMAL HIGH (ref 11.5–15.5)
Smear Review: NORMAL
WBC: 73.9 10*3/uL (ref 4.0–10.5)
nRBC: 0 % (ref 0.0–0.2)

## 2019-10-15 LAB — BASIC METABOLIC PANEL
Anion gap: 11 (ref 5–15)
BUN: 9 mg/dL (ref 8–23)
CO2: 20 mmol/L — ABNORMAL LOW (ref 22–32)
Calcium: 8 mg/dL — ABNORMAL LOW (ref 8.9–10.3)
Chloride: 106 mmol/L (ref 98–111)
Creatinine, Ser: 1.06 mg/dL (ref 0.61–1.24)
GFR calc Af Amer: >60 mL/min (ref >60)
GFR calc non Af Amer: >60 mL/min (ref >60)
Glucose, Bld: 81 mg/dL (ref 70–99)
Potassium: 2.9 mmol/L — ABNORMAL LOW (ref 3.5–5.1)
Sodium: 137 mmol/L (ref 135–145)

## 2019-10-15 LAB — MAGNESIUM: Magnesium: 1.6 mg/dL — ABNORMAL LOW (ref 1.7–2.4)

## 2019-10-15 MED ORDER — CHLORHEXIDINE GLUCONATE CLOTH 2 % EX PADS
6.0000 | MEDICATED_PAD | Freq: Every day | CUTANEOUS | Status: DC
  Administered 2019-10-15 – 2019-10-28 (×10): 6 via TOPICAL

## 2019-10-15 MED ORDER — POTASSIUM CHLORIDE CRYS ER 20 MEQ PO TBCR
40.0000 meq | EXTENDED_RELEASE_TABLET | Freq: Once | ORAL | Status: AC
  Administered 2019-10-15: 40 meq via ORAL
  Filled 2019-10-15: qty 2

## 2019-10-15 MED ORDER — MAGNESIUM SULFATE 2 GM/50ML IV SOLN
2.0000 g | Freq: Once | INTRAVENOUS | Status: AC
  Administered 2019-10-15: 2 g via INTRAVENOUS
  Filled 2019-10-15: qty 50

## 2019-10-15 MED ORDER — POTASSIUM CHLORIDE 10 MEQ/100ML IV SOLN
10.0000 meq | INTRAVENOUS | Status: AC
  Administered 2019-10-15 (×4): 10 meq via INTRAVENOUS
  Filled 2019-10-15: qty 100

## 2019-10-15 NOTE — Progress Notes (Signed)
PT Cancellation Note  Patient Details Name: Vincent Poole MRN: [id number] DOB: Jun 06, 1935   Cancelled Treatment:    Reason Eval/Treat Not Completed: Fatigue/lethargy limiting ability to participate Per PM OT note: On 2nd attempt, wife present. Pt reports fatigue and wife endorses as well. Wife requests therapy attempt next date in the morning before his radiation treatments (typically occurring around 10am per wife) to provide the most optimal chance at participation. Pt very apologetic and reports eager to get moving. Will coordinate with RN and OT for next attempt tomorrow am as able.  PT session held this date, will attempt (early?) as able at later date.  Kreg Shropshire, DPT 10/15/2019, 4:33 PM

## 2019-10-15 NOTE — Consult Note (Addendum)
Consultation Note Date: 10/15/2019   Patient Name: Vincent Poole  DOB: 1935/09/27  MRN: [id number]  Age / Sex: 84 y.o., male  PCP: [name], MD Referring Physician: Jennye Boroughs, MD  Reason for Consultation: Establishing goals of care  HPI/Patient Profile: CHA GOMILLION is an 84 y.o. male with medical history significant forlocally-metastatic bladder cancer on chemotherapy, urinary tract obstruction with b/l nephrostomy tubes in place, recurrent UTI, hypertension, colostomy in place, provoked dvt s/p anticoagulation, who presents with a red "lump" on his lower abdomen in the suprapubic region for several months.   Clinical Assessment and Goals of Care: Patient is resting in bed. He has just returned from radiation. His wife is at bedside. He states radiation went well today. He defers to his wife to answer questions. She states they have been married 7 years. They have 3 children, grandchildren, and great grandchildren.   Wife discusses his 2 nephrology drains, colostomy and new abdominal drain.  She states he has been doing well, with a good QOL prior to a few days prior to this hospitalization. She states he uses a walker and has a good appetite.   Asked him how he feels about his current care. Patient stated "I'm hanging in there, what choice do I have?" Discussed the option of comfort care to which he became tearful and was clear he wanted to continue any interventions possible to increase his time on earth.  Wife states people "are beating a dead horse" when discussing anything other than doing everything possible for her husband at this time. She voices frustration stating that her husband is a living human being, and he knows what is going on, and until he tells her he is ready to stop life prolonging measures, they will continue all care possible besides DNR status. She states she knows he  will not improve, and that the [facility] will take him, but the [facility] will take him despite medical intervention. They understand the option of hospice if they choose, as wife states her father had hospice care. She states she will advocate for hospice when her husband is ready and they will discuss between themselves when that time would be.   Patient has been evaluated by multiple inpatient palliative providers in the past. They state they will request palliative medicine service to be consulted if they choose to speak with [place].      SUMMARY OF RECOMMENDATIONS   Continue current care. Please reconsult if patient requests/ is amenable our service.   Prognosis:   Poor overall.        Primary Diagnoses: Present on Admission:  Sepsis ([place])  Essential hypertension  Prostate cancer ([place])  Urinary obstruction  Malignant neoplasm of urinary bladder ([facility])  Pressure injury of skin   I have reviewed the medical record, interviewed the patient and family, and examined the patient. The following aspects are pertinent.  Past Medical History:  Diagnosis Date   Anxiety    Arthritis    Bladder cancer ([place])    Dysrhythmia  07/2018   history of atrial flutter that worsens with anxiety   Femur fracture, right ([place]) 05/01/2018   GERD (gastroesophageal reflux disease)    History of recent blood transfusion 05/2018   Hypertension    Iron deficiency anemia 09/14/2018   Prostate cancer ([place]) 07/2018   cancer growing in prostate but not prostate cancer, it is from the bladder   Umbilical hernia 40/1027   Urinary retention 2019   foley catheter place 11/2017   UTI (urinary tract infection) 2019   frequent UTI's over last year   Wound eschar of foot 07/2018   left heal getting wrapped and requiring antibiotic cream. cracks open with weight bearing.   Social History   Socioeconomic History   Marital status: Married    Spouse name: ruth   Number of children: Not on file    Years of education: Not on file   Highest education level: Not on file  Occupational History   Occupation: retired    Comment: Barista strain: Patient refused   Food insecurity    Worry: Patient refused    Inability: Patient refused   Transportation needs    Medical: Patient refused    Non-medical: Patient refused  Tobacco Use   Smoking status: Never Smoker   Smokeless tobacco: Never Used  Substance and Sexual Activity   Alcohol use: No    Alcohol/week: 0.0 standard drinks   Drug use: No   Sexual activity: Not Currently  Lifestyle   Physical activity    Days per week: Patient refused    Minutes per session: Patient refused   Stress: Patient refused  Relationships   Social connections    Talks on phone: Patient refused    Gets together: Patient refused    Attends religious service: Patient refused    Active member of club or organization: Patient refused    Attends meetings of clubs or organizations: Patient refused    Relationship status: Patient refused  Other Topics Concern   Not on file  Social History Narrative   Not on file   Family History  Problem Relation Age of Onset   Cancer Mother    Chronic Renal Failure Mother    Heart disease Father    Scheduled Meds:  enoxaparin (LOVENOX) injection  40 mg Subcutaneous Q24H   feeding supplement (ENSURE ENLIVE)  237 mL Oral TID BM   mirabegron ER  50 mg Oral Daily   sodium chloride flush  5 mL Intravenous Q8H   Continuous Infusions:  sodium chloride Stopped (10/14/19 1547)   ceFEPime (MAXIPIME) IV 2 g (10/15/19 1212)   dextrose 5 % and 0.45% NaCl 100 mL/hr at 10/15/19 0400   metronidazole Stopped (10/15/19 0332)   vancomycin Stopped (10/14/19 1111)   PRN Meds:.sodium chloride, oxyCODONE Medications Prior to Admission:  Prior to Admission medications   Medication Sig Start Date End Date Taking? Authorizing Provider  acetaminophen (TYLENOL)  500 MG tablet Take 1,000 mg by mouth every 6 (six) hours as needed for moderate pain or headache.    Yes [provider]  cetirizine (ZYRTEC) 10 MG tablet Take 10 mg by mouth daily as needed for allergies.    Yes [provider]  Cranberry-Cholecalciferol (SUPER CRANBERRY/VITAMIN D3) 4200-500 MG-UNIT CAPS Take 1 capsule by mouth 2 (two) times daily.   Yes [provider]  dexamethasone (DECADRON) 4 MG tablet Take 2 tablets (8 mg total) by mouth daily. Start the day after chemotherapy  for 2 days. 08/01/19  Yes Sindy Guadeloupe, MD  feeding supplement, ENSURE ENLIVE, (ENSURE ENLIVE) LIQD Take 237 mLs by mouth 2 (two) times daily between meals. 06/11/19  Yes Bettey Costa, MD  ferrous sulfate 325 (65 FE) MG tablet Take 1 tablet (325 mg total) by mouth 2 (two) times daily with a meal. 05/06/18  Yes Gouru, Aruna, MD  Glycerin-Hypromellose-PEG 400 (DRY EYE RELIEF DROPS OP) Apply 1 drop to eye daily as needed.    Yes [provider]  lidocaine-prilocaine (EMLA) cream Apply 1 application topically as needed. 01/02/19  Yes Sindy Guadeloupe, MD  Multiple Vitamin (MULTIVITAMIN WITH MINERALS) TABS tablet Take 1 tablet by mouth daily.   Yes [provider]  MYRBETRIQ 50 MG TB24 tablet TAKE 1 TABLET BY MOUTH DAILY Patient taking differently: Take 50 mg by mouth daily.  04/30/19  Yes McGowan, Larene Beach A, PA-C  nystatin [facility]) powder Apply topically 2 (two) times daily. Patient taking differently: Apply 1 g topically 2 (two) times daily as needed.  09/28/18  Yes McGowan, Larene Beach A, PA-C  ondansetron (ZOFRAN) 8 MG tablet Take 1 tablet (8 mg total) by mouth 2 (two) times daily as needed for refractory nausea / vomiting. Start on day 3 after chemo. 08/01/19  Yes Sindy Guadeloupe, MD  oxyCODONE (OXY IR/ROXICODONE) 5 MG immediate release tablet Take 1 tablet (5 mg total) by mouth every 4 (four) hours as needed for severe pain. 03/21/19  Yes Sindy Guadeloupe, MD  polyethylene glycol powder  (MIRALAX) powder Take 17 g by mouth daily as needed. Can increase to 3 times a day as needed for constipation but hold medication if has diarrhea 01/30/18  Yes Sindy Guadeloupe, MD  prochlorperazine (COMPAZINE) 10 MG tablet Take 1 tablet (10 mg total) by mouth every 6 (six) hours as needed (Nausea or vomiting). 08/01/19  Yes Sindy Guadeloupe, MD  vitamin B-12 (CYANOCOBALAMIN) 500 MCG tablet Take 500 mcg by mouth daily.   Yes [provider]  zinc oxide 20 % ointment Apply 1 application topically as needed for irritation.   Yes [provider]   No Known Allergies Review of Systems  All other systems reviewed and are negative.   Physical Exam Pulmonary:     Effort: Pulmonary effort is normal.  Skin:    General: Skin is warm and dry.  Neurological:     Mental Status: He is alert.     Vital Signs: BP 112/62 (BP Location: Right Arm)    Pulse (!) 105    Temp 98.1 F (36.7 C) (Oral)    Resp 16    Ht 5\' 11"  (1.803 m)    Wt 66.6 kg    SpO2 98%    BMI 20.48 kg/m  Pain Scale: 0-10 POSS *See Group Information*: S-Acceptable,Sleep, easy to arouse Pain Score: 0-No pain   SpO2: SpO2: 98 % O2 Device:SpO2: 98 % O2 Flow Rate: .   IO: Intake/output summary:   Intake/Output Summary (Last 24 hours) at 10/15/2019 1217 Last data filed at 10/15/2019 0900 Gross per 24 hour  Intake 3196.06 ml  Output 2895 ml  Net 301.06 ml    LBM: Last BM Date: 10/15/19 Baseline Weight: Weight: 65.8 kg Most recent weight: Weight: 66.6 kg     Palliative Assessment/Data:     Time In: 12:00 Time Out: 12:50 Time Total: 50 min Greater than 50%  of this time was spent counseling and coordinating care related to the above assessment and plan.  Signed  by: Asencion Gowda, NP   Please contact Palliative Medicine Team phone at [phone] for questions and concerns.  For individual provider: See Shea Evans

## 2019-10-15 NOTE — Consult Note (Signed)
[facility] for Electrolyte Monitoring and Replacement   Recent Labs: Potassium (mmol/L)  Date Value  10/15/2019 2.9 (L)   Magnesium (mg/dL)  Date Value  10/15/2019 1.6 (L)   Calcium (mg/dL)  Date Value  10/15/2019 8.0 (L)   Albumin (g/dL)  Date Value  10/06/2019 2.4 (L)   Phosphorus (mg/dL)  Date Value  10/10/2019 2.5   Sodium (mmol/L)  Date Value  10/15/2019 137   Corrected Ca: 9.5 mg/dL  Assessment: 84 y.o.malewith medical history significant forlocally-metastatic bladder cancer on chemotherapy, urinary tract obstruction with b/l nephrostomy tubes in place, recurrent UTI, htn, colostomy in place, provoked dvt s/p anticoagulation, who presents with an abscess with sepsis - secondary to locally metastatic bladder cancerconcerning forerosionthrough the anterior abdominal wall.  Goal of Therapy:  Electrolytes WNL  Plan:   KCl 51mEq x 4 doses.  Magnesium Sulfate 2g IV x 1 dose.  BMP in AM.   Pearla Dubonnet ,PharmD Clinical Pharmacist 10/15/2019 1:40 PM

## 2019-10-15 NOTE — Progress Notes (Addendum)
Progress Note    Vincent Poole  TGG:[id number] DOB: 02-21-1935  DOA: 10/05/2019 PCP: [name], MD      Brief Narrative:    Medical records reviewed and are as summarized below:  Vincent Poole is an 84 y.o. male with medical history significant forlocally-metastatic bladder cancer on chemotherapy, urinary tract obstruction with b/l nephrostomy tubes in place, recurrent UTI, hypertension, colostomy in place, provoked dvt s/p anticoagulation, who presents with a red "lump" on his lower abdomen in the suprapubic region for several months.  This was noted at recent hospitalization, thought to be 2/2 metastatic bladder cancer.       Assessment/Plan:   Principal Problem:   Intra-abdominal infection Active Problems:   Sepsis ([place])   Essential hypertension   Prostate cancer (H[facility])   Urinary obstruction   Malignant neoplasm of urinary bladder (HCC)   Pressure injury of skin   S/P partial resection of colon   Intra-abdominal abscess ([facility])   Body mass index is 20.48 kg/m.    Sepsis secondary to abdominal wall abscess/significant leukocytosis: Continue empiric IV antibiotics.  Metastatic bladder cancer: Follow-up with oncologist.  Patient is on radiation therapy.  Patient was evaluated by palliative care team today and he and family insist on continuing treatment.  Acute on chronic leukocytosis: Likely worsened by infection  Hypokalemia and hypomagnesemia: Replete potassium and magnesium respectively.  Monitor levels.  Bilateral nephrostomy tubes: Continue nephrostomy care per nursing.  Stage I sacral decubitus ulcer: This was present on admission.  Continue local wound care.   Family Communication/Anticipated D/C date and plan/Code Status   DVT prophylaxis: SCDs, Lovenox Code Status: DNR Family Communication: Plan discussed with his wife at the bedside Disposition Plan: To be determined      Subjective:   He complains of pain in the lower  abdomen otherwise he feels okay.  Objective:    Vitals:   10/14/19 1253 10/14/19 2000 10/15/19 0448 10/15/19 1156  BP: (!) 114/59 (!) 105/54 114/61 112/62  Pulse: (!) 109 (!) 109 (!) 106 (!) 105  Resp: 16 20 16 16   Temp: 97.9 F (36.6 C) 97.9 F (36.6 C) 98.3 F (36.8 C) 98.1 F (36.7 C)  TempSrc: Oral Oral Oral Oral  SpO2: 100% 96% 97% 98%  Weight:      Height:        Intake/Output Summary (Last 24 hours) at 10/15/2019 1709 Last data filed at 10/15/2019 1601 Gross per 24 hour  Intake 4394.07 ml  Output 2895 ml  Net 1499.07 ml   Filed Weights   10/05/19 1418 10/05/19 2139  Weight: 65.8 kg 66.6 kg    Exam:  GEN: NAD SKIN: No rash EYES: no pallor or icterus ENT: MMM CV: RRR PULM: CTA B ABD: soft, ND, NT, +BS , + colostomy bag with feces, + left and right nephrostomy tubes with urine, + suprapubic drain with brownish fluid in the bag CNS: AAO x 3, non focal EXT: No edema or tenderness   Data Reviewed:   I have personally reviewed following labs and imaging studies:  Labs: Labs show the following:   Basic Metabolic Panel: Recent Labs  Lab 10/09/19 0406 10/10/19 0623 10/11/19 0504 10/12/19 0456 10/13/19 0446 10/14/19 0638 10/15/19 0719  NA 137 137 137 139 139 141 137  K 3.2* 4.6 3.8 3.8 3.3* 3.7 2.9*  CL 109 110 109 110 107 112* 106  CO2 21* 19* 20* 21* 21* 22 20*  GLUCOSE 119* 108* 88 106* 50* 103*  81  BUN 10 9 10 10 10 9 9   CREATININE 0.85 1.02 0.95 1.16 1.02 0.91 1.06  CALCIUM 8.2* 8.3* 8.3* 8.5* 8.3* 8.1* 8.0*  MG 2.2 2.0  --   --   --  1.7 1.6*  PHOS  --  2.5  --   --   --   --   --    GFR Estimated Creatinine Clearance: 50.6 mL/min (by C-G formula based on SCr of 1.06 mg/dL). Liver Function Tests: No results for input(s): AST, ALT, ALKPHOS, BILITOT, PROT, ALBUMIN in the last 168 hours. No results for input(s): LIPASE, AMYLASE in the last 168 hours. No results for input(s): AMMONIA in the last 168 hours. Coagulation profile No results for  input(s): INR, PROTIME in the last 168 hours.  CBC: Recent Labs  Lab 10/10/19 0623 10/11/19 0504 10/13/19 0446 10/14/19 0638 10/15/19 0719  WBC 64.1* 72.7* 72.7* 68.8* 73.9*  NEUTROABS 53.9* 61.9* 61.8* 57.9* 62.4*  HGB 10.3* 10.2* 10.3* 10.1* 10.3*  HCT 33.7* 32.4* 32.7* 32.1* 32.5*  MCV 91.1 89.8 89.6 91.2 88.8  PLT 402* 408* 409* 435* 415*   Cardiac Enzymes: No results for input(s): CKTOTAL, CKMB, CKMBINDEX, TROPONINI in the last 168 hours. BNP (last 3 results) No results for input(s): PROBNP in the last 8760 hours. CBG: No results for input(s): GLUCAP in the last 168 hours. D-Dimer: No results for input(s): DDIMER in the last 72 hours. Hgb A1c: No results for input(s): HGBA1C in the last 72 hours. Lipid Profile: No results for input(s): CHOL, HDL, LDLCALC, TRIG, CHOLHDL, LDLDIRECT in the last 72 hours. Thyroid function studies: No results for input(s): TSH, T4TOTAL, T3FREE, THYROIDAB in the last 72 hours.  Invalid input(s): FREET3 Anemia work up: No results for input(s): VITAMINB12, FOLATE, FERRITIN, TIBC, IRON, RETICCTPCT in the last 72 hours. Sepsis Labs: Recent Labs  Lab 10/11/19 0504 10/13/19 0446 10/14/19 0638 10/15/19 0719  WBC 72.7* 72.7* 68.8* 73.9*    Microbiology No results found for this or any previous visit (from the past 240 hour(s)).  Procedures and diagnostic studies:  No results found.  Medications:   . Chlorhexidine Gluconate Cloth  6 each Topical Daily  . enoxaparin (LOVENOX) injection  40 mg Subcutaneous Q24H  . feeding supplement (ENSURE ENLIVE)  237 mL Oral TID BM  . mirabegron ER  50 mg Oral Daily  . sodium chloride flush  5 mL Intravenous Q8H   Continuous Infusions: . sodium chloride Stopped (10/14/19 1547)  . ceFEPime (MAXIPIME) IV Stopped (10/15/19 1243)  . dextrose 5 % and 0.45% NaCl Stopped (10/15/19 1601)  . metronidazole Stopped (10/15/19 1509)  . vancomycin Stopped (10/14/19 1111)     LOS: 10 days   Lanah Steines   [facility]   *Please refer to [email or web address], password [email or web address] to get updated schedule on who will round on this patient, as hospitalists switch teams weekly. If 7PM-7AM, please contact night-coverage at [email or web address], password [email or web address] for any overnight needs.  10/15/2019, 5:09 PM

## 2019-10-15 NOTE — Progress Notes (Signed)
Patient only had one IV but had multiple IV medications ordered. I asked Dr. Mal Misty if we could access his port. Dr. Mal Misty placed an order

## 2019-10-15 NOTE — Progress Notes (Signed)
OT Cancellation Note  Patient Details Name: Vincent Poole MRN: [id number] DOB: [birth year]   Cancelled Treatment:    Reason Eval/Treat Not Completed: Other (comment). Spoke with Palliative Care NP who was heading into see the pt shortly. She requested therapy hold for now to allow time for additional [place] discussions. Will continue to follow and re-attempt as appropriate.   Jeni Salles, MPH, MS, OTR/L ascom [phone] 10/15/19, 9:49 AM

## 2019-10-15 NOTE — Progress Notes (Signed)
OT Cancellation Note  Patient Details Name: Vincent Poole MRN: [id number] DOB: 1935/06/25   Cancelled Treatment:    Reason Eval/Treat Not Completed: Patient declined, no reason specified;Fatigue/lethargy limiting ability to participate. On 2nd attempt, wife present. Pt reports fatigue and wife endorses as well. Wife requests therapy attempt next date in the morning before his radiation treatments (typically occurring around 10am per wife) to provide the most optimal chance at participation. Pt very apologetic and reports eager to get moving. Will coordinate with RN and OT for next attempt tomorrow am as able.   Jeni Salles, MPH, MS, OTR/L ascom [phone] 10/15/19, 4:31 PM

## 2019-10-16 ENCOUNTER — Ambulatory Visit
Admission: RE | Admit: 2019-10-16 | Discharge: 2019-10-16 | Disposition: A | Discharge status: Home / Self Care | Payer: Medicare Other | Source: Ambulatory Visit | Attending: Radiation Oncology | Admitting: Radiation Oncology

## 2019-10-16 DIAGNOSIS — N131 Hydronephrosis with ureteral stricture, not elsewhere classified: Secondary | ICD-10-CM

## 2019-10-16 DIAGNOSIS — C792 Secondary malignant neoplasm of skin: Secondary | ICD-10-CM

## 2019-10-16 DIAGNOSIS — Z933 Colostomy status: Secondary | ICD-10-CM

## 2019-10-16 DIAGNOSIS — Z960 Presence of urogenital implants: Secondary | ICD-10-CM

## 2019-10-16 DIAGNOSIS — L02211 Cutaneous abscess of abdominal wall: Secondary | ICD-10-CM

## 2019-10-16 DIAGNOSIS — C679 Malignant neoplasm of bladder, unspecified: Secondary | ICD-10-CM

## 2019-10-16 DIAGNOSIS — Z8781 Personal history of (healed) traumatic fracture: Secondary | ICD-10-CM

## 2019-10-16 DIAGNOSIS — D72829 Elevated white blood cell count, unspecified: Secondary | ICD-10-CM

## 2019-10-16 DIAGNOSIS — Z9221 Personal history of antineoplastic chemotherapy: Secondary | ICD-10-CM

## 2019-10-16 DIAGNOSIS — Z936 Other artificial openings of urinary tract status: Secondary | ICD-10-CM

## 2019-10-16 LAB — BASIC METABOLIC PANEL
Anion gap: 9 (ref 5–15)
BUN: 11 mg/dL (ref 8–23)
CO2: 23 mmol/L (ref 22–32)
Calcium: 8.5 mg/dL — ABNORMAL LOW (ref 8.9–10.3)
Chloride: 110 mmol/L (ref 98–111)
Creatinine, Ser: 0.98 mg/dL (ref 0.61–1.24)
GFR calc Af Amer: >60 mL/min (ref >60)
GFR calc non Af Amer: >60 mL/min (ref >60)
Glucose, Bld: 91 mg/dL (ref 70–99)
Potassium: 4.6 mmol/L (ref 3.5–5.1)
Sodium: 142 mmol/L (ref 135–145)

## 2019-10-16 LAB — CBC
HCT: 31.2 % — ABNORMAL LOW (ref 39.0–52.0)
Hemoglobin: 9.9 g/dL — ABNORMAL LOW (ref 13.0–17.0)
MCH: 28.7 pg (ref 26.0–34.0)
MCHC: 31.7 g/dL (ref 30.0–36.0)
MCV: 90.4 fL (ref 80.0–100.0)
Platelets: 390 10*3/uL (ref 150–400)
RBC: 3.45 MIL/uL — ABNORMAL LOW (ref 4.22–5.81)
RDW: 19.8 % — ABNORMAL HIGH (ref 11.5–15.5)
WBC: 71 10*3/uL (ref 4.0–10.5)
nRBC: 0 % (ref 0.0–0.2)

## 2019-10-16 LAB — MAGNESIUM: Magnesium: 2.2 mg/dL (ref 1.7–2.4)

## 2019-10-16 MED ORDER — VANCOMYCIN HCL 10 G IV SOLR
1250.0000 mg | INTRAVENOUS | Status: DC
  Filled 2019-10-16: qty 1250

## 2019-10-16 MED ORDER — ADULT MULTIVITAMIN W/MINERALS CH
1.0000 | ORAL_TABLET | Freq: Every day | ORAL | Status: DC
  Administered 2019-10-17 – 2019-10-29 (×12): 1 via ORAL
  Filled 2019-10-16 (×12): qty 1

## 2019-10-16 NOTE — Evaluation (Signed)
Occupational Therapy Evaluation Patient Details Name: Vincent Poole MRN: [id number] DOB: July 31, 1935 Today's Date: 10/16/2019    History of Present Illness Pt. is an 84 y.o. male with medical history significant for locally-metastatic bladder cancer on chemotherapy, urinary tract obstruction with b/l nephrostomy tubes in place, recurrent UTI, htn, colostomy in place. Pt with PMH of GERD, anxiety, arthritis, history of atrial flutter, R femur fx, HTN, prostate cancer, umbilical hernia, wound eschar of L foot, multiple bladder surgeries/procedures. Pt presents to ED with a red "lump" on his lower abdomen in the suprapubic region for several months, has open/draining; noted at recent hospitalization thought to be 2/2 metastatic bladder cancer. Pt given broad spectrum abx and CT abd ordered.   Clinical Impression   Pt. presents with weakness, poor activity tolerance, and limited functional mobility which hinders his ability to complete basic ADL and IADL functioning. Pt. Resides at home with his wife. Pt. has children who live closeby.Pt. Required assist with morning ADLs, and IADL tasks including: meal preparation, and medication management from his wife. Pt. Reports that his son also assists pt. when needed. Pt. SO2 97%, on RA, HR 102-103 bpms.  Pt. Presents with poor activity tolerance for sitting at the EOB with increased assist to move, and align his trunk when performing supine to sit. Pt. will benefit from OT services for ADL training, A/E training, UE there. Ex., and pt. Education about energy conservation, work simplification, positioning, home modification, and DME. Pt. Would benefit from STR at SNF with follow-up OT services to increase strength, and activity tolerance to maximize functional mobility, independence with ADLs, and IADLs. Pt. Has family support, and assist at home.     Follow Up Recommendations  SNF    Equipment Recommendations       Recommendations for Other Services       Precautions / Restrictions Precautions Precautions: Fall Precaution Comments: bilateral nephrostomy tubes, colostomy; abdominal drain Restrictions Weight Bearing Restrictions: No      Mobility Bed Mobility Overal bed mobility: Needs Assistance Bed Mobility: Supine to Sit;Sit to Supine     Supine to sit: Mod assist;Max assist Sit to supine: Min assist   General bed mobility comments: Pt. was able to initiate rolling, and was indedependently able to maneuver  the bilateral LEs, Pt. required mod-maxA to move, and align the trunk. Pt. presented with limited activity tolerance for sitting  Transfers                 General transfer comment: Deferred    Balance                                           ADL either performed or assessed with clinical judgement   ADL Overall ADL's : Needs assistance/impaired Eating/Feeding: Set up;Independent;Bed level   Grooming: Set up;Independent;Bed level;Maximal assistance;Sitting   Upper Body Bathing: Set up;Minimal assistance   Lower Body Bathing: Set up;Maximal assistance   Upper Body Dressing : Set up;Moderate assistance;Bed level   Lower Body Dressing: Set up;Bed level;Maximal assistance                       Vision Baseline Vision/History: No visual deficits       Perception     Praxis      Pertinent Vitals/Pain Pain Assessment: No/denies pain     Hand Dominance Right   Extremity/Trunk Assessment  Upper Extremity Assessment Upper Extremity Assessment: Generalized weakness           Communication Communication Communication: HOH   Cognition Arousal/Alertness: Awake/alert Behavior During Therapy: WFL for tasks assessed/performed Overall Cognitive Status: Within Functional Limits for tasks assessed                                     General Comments       Exercises     Shoulder Instructions      Home Living Family/patient expects to be discharged to::  Private residence Living Arrangements: Spouse/significant other Available Help at Discharge: Family;Available 24 hours/day Type of Home: House Home Access: Stairs to enter   Entrance Stairs-Rails: Can reach both Home Layout: One level     Bathroom Shower/Tub: Biomedical scientist: Standard     Home Equipment: Toilet riser;Cane - single point;Walker - 2 wheels;Hospital bed;Wheelchair - manual;Bedside commode          Prior Functioning/Environment Level of Independence: Needs assistance    Comments: Pt.'s wife resides with the pt. and children live very close by and also assist frequently; someone is there 24/7  Pt. Required assist with morning bathing, and dressing skills, meal preparation, and medication management.        OT Problem List: Decreased strength;Decreased activity tolerance;Decreased range of motion;Decreased knowledge of use of DME or AE;Impaired balance (sitting and/or standing)      OT Treatment/Interventions: Self-care/ADL training;Therapeutic exercise;Neuromuscular education;Patient/family education;Energy conservation;DME and/or AE instruction;Therapeutic activities    OT Goals(Current goals can be found in the care plan section)    OT Frequency: Min 2X/week   Barriers to D/C:            Co-evaluation              AM-PAC OT "6 Clicks" Daily Activity     Outcome Measure Help from another person eating meals?: A Little Help from another person taking care of personal grooming?: A Little Help from another person toileting, which includes using toliet, bedpan, or urinal?: A Lot Help from another person bathing (including washing, rinsing, drying)?: A Lot Help from another person to put on and taking off regular upper body clothing?: A Lot Help from another person to put on and taking off regular lower body clothing?: A Lot 6 Click Score: 14   End of Session    Activity Tolerance: Patient tolerated treatment well Patient  left: in bed;with call bell/phone within reach;with bed alarm set  OT Visit Diagnosis: Muscle weakness (generalized) (M62.81)                Time: 2979-8921 OT Time Calculation (min): 30 min Charges:  OT General Charges $OT Visit: 1 Visit OT Evaluation $OT Eval Moderate Complexity: 1 Mod  Harrel Carina, MS, OTR/L  Harrel Carina 10/16/2019, 10:42 AM

## 2019-10-16 NOTE — Progress Notes (Signed)
Initial Nutrition Assessment  DOCUMENTATION CODES:   Not applicable  INTERVENTION:   Ensure Enlive po TID, each supplement provides 350 kcal and 20 grams of protein  Magic cup TID with meals, each supplement provides 290 kcal and 9 grams of protein  MVI daily   NUTRITION DIAGNOSIS:   Increased nutrient needs related to cancer and cancer related treatments as evidenced by increased estimated needs.  GOAL:   Patient will meet greater than or equal to 90% of their needs  MONITOR:   PO intake, Supplement acceptance, Labs, Weight trends, Skin, I & O's  REASON FOR ASSESSMENT:   LOS    ASSESSMENT:   84 y.o. male  with medical history significant for locally-metastatic bladder cancer on chemotherapy, urinary tract obstruction with b/l nephrostomy tubes in place, recurrent UTI, hypertension, colostomy in place, provoked dvt s/p anticoagulation, who presents with a red "lump" on his lower abdomen in the suprapubic region for several months. Pt with abdominal wall abscess  RD working remotely.  Pt with fair appetite and oral intake in hospital; pt eating 40-75% of meals but refusing most Ensure supplements. Pt reports his appetite is good at baseline. RD will add Magic Cups to meal trays to help pt meet his estimated needs and support wound healing. RD will also add MVI daily. Per chart, pt appears fairly weight stable pta.   Pt at high risk for malnutrition but unable to diagnose at this time as NFPE cannot be performed.    Medications reviewed and include: lovenox, cefepime, NaCl w/ 5% dextrose @100ml /hr, metronidazole, vancomycin    Labs reviewed: wbc- 71(H), Hgb 9.9(L), Hct 31.2(L)  Unable to complete Nutrition-Focused physical exam at this time.   Diet Order:   Diet Order            Diet regular Room service appropriate? Yes; Fluid consistency: Thin  Diet effective now             EDUCATION NEEDS:   No education needs have been identified at this time  Skin:   Skin Assessment: Reviewed RN Assessment(Stage 1 sacrum, wound abdomen)  Last BM:  12/9- ostomy  Height:   Ht Readings from Last 1 Encounters:  10/05/19 5\' 11"  (1.803 m)    Weight:   Wt Readings from Last 1 Encounters:  10/05/19 66.6 kg    Ideal Body Weight:  78 kg  BMI:  Body mass index is 20.48 kg/m.  Estimated Nutritional Needs:   Kcal:  1800-2100kcal/day  Protein:  90-105g/day  Fluid:  >1.7L/day  [name] MS, RD, LDN Pager #- [phone] Office#- [phone] After Hours Pager: [phone]

## 2019-10-16 NOTE — Consult Note (Signed)
PHARMACY CONSULT NOTE  Pharmacy Consult for Electrolyte Monitoring and Replacement   Recent Labs: Potassium (mmol/L)  Date Value  10/16/2019 4.6   Magnesium (mg/dL)  Date Value  10/16/2019 2.2   Calcium (mg/dL)  Date Value  10/16/2019 8.5 (L)   Albumin (g/dL)  Date Value  10/06/2019 2.4 (L)   Phosphorus (mg/dL)  Date Value  10/10/2019 2.5   Sodium (mmol/L)  Date Value  10/16/2019 142   Corrected Ca: 9.5 mg/dL  Assessment: 84 y.o.malewith medical history significant forlocally-metastatic bladder cancer on chemotherapy, urinary tract obstruction with b/l nephrostomy tubes in place, recurrent UTI, htn, colostomy in place, provoked dvt s/p anticoagulation, who presents with an abscess with sepsis - secondary to locally metastatic bladder cancerconcerning forerosionthrough the anterior abdominal wall.  Goal of Therapy:  Electrolytes WNL  Plan:   No further supplementation needed currently.  BMP/Magnesium level in AM.   Pearla Dubonnet ,PharmD Clinical Pharmacist 10/16/2019 10:26 AM

## 2019-10-16 NOTE — Progress Notes (Addendum)
Progress Note    EH SESAY  GDJ:[id number] DOB: [birth year]  DOA: 10/05/2019 PCP: [name], MD      Brief Narrative:    Medical records reviewed and are as summarized below:  Vincent Poole is an 84 y.o. male with medical history significant forlocally-metastatic bladder cancer on chemotherapy, urinary tract obstruction with b/l nephrostomy tubes in place, recurrent UTI, hypertension, colostomy in place, provoked dvt s/p anticoagulation, who presents with a red "lump" on his lower abdomen in the suprapubic region for several months.  This was noted at recent hospitalization, thought to be 2/2 metastatic bladder cancer.       Assessment/Plan:   Principal Problem:   Intra-abdominal infection Active Problems:   Sepsis ([place])   Essential hypertension   Prostate cancer ([facility])   Urinary obstruction   Malignant neoplasm of urinary bladder ([facility])   Pressure injury of skin   S/P partial resection of colon   Intra-abdominal abscess ([facility])   Body mass index is 20.48 kg/m.    Sepsis secondary to abdominal wall abscess/significant leukocytosis: Blood cultures did not show any growth.  Continue empiric IV antibiotics.  Consulted infectious disease specialist for guidance on duration of antibiotics/assistance with management.  Metastatic bladder cancer: Follow-up with oncologist.  He has been getting radiation therapy while inpatient.  Patient has been evaluated by the palliative care team and he and his wife want to continue treatment for now.  Acute on chronic leukocytosis: Likely worsened by infection  Hypokalemia and hypomagnesemia: Improved.   Bilateral nephrostomy tubes: Continue nephrostomy care per nursing.  Stage I sacral decubitus ulcer: This was present on admission.  Continue local wound care.   Family Communication/Anticipated D/C date and plan/Code Status   DVT prophylaxis: SCDs, Lovenox Code Status: DNR Family Communication: None  Disposition Plan: To be determined      Subjective:   He has no new complaints.  He still has some abdominal pain but it's a little better today.  No vomiting, shortness of breath, chest pain or cough.   Objective:    Vitals:   10/16/19 0429 10/16/19 0814 10/16/19 0920 10/16/19 1253  BP: 101/64 114/68  119/60  Pulse: (!) 102 (!) 123 (!) 107 (!) 109  Resp: 20 20  16   Temp: (!) 97.5 F (36.4 C) 97.7 F (36.5 C)  98.1 F (36.7 C)  TempSrc:  Oral    SpO2: 98% 96% 97% 98%  Weight:      Height:        Intake/Output Summary (Last 24 hours) at 10/16/2019 1435 Last data filed at 10/16/2019 6222 Gross per 24 hour  Intake 4217.3 ml  Output 1725 ml  Net 2492.3 ml   Filed Weights   10/05/19 1418 10/05/19 2139  Weight: 65.8 kg 66.6 kg    Exam:  GEN: NAD SKIN: No rash EYES: EOMI ENT: MMM CV: RRR PULM: CTA B ABD: soft, ND, NT, +BS, +colostomy bag, + bilateral nephrostomy tubes with amber urine, + suprapubic drain with orange-colored fluid in the bag CNS: AAO x 3, non focal EXT: No edema or tenderness   Data Reviewed:   I have personally reviewed following labs and imaging studies:  Labs: Labs show the following:   Basic Metabolic Panel: Recent Labs  Lab 10/10/19 0623  10/12/19 0456 10/13/19 0446 10/14/19 0638 10/15/19 0719 10/16/19 0357  NA 137   < > 139 139 141 137 142  K 4.6   < > 3.8 3.3* 3.7 2.9* 4.6  CL 110   < > 110 107 112* 106 110  CO2 19*   < > 21* 21* 22 20* 23  GLUCOSE 108*   < > 106* 50* 103* 81 91  BUN 9   < > 10 10 9 9 11   CREATININE 1.02   < > 1.16 1.02 0.91 1.06 0.98  CALCIUM 8.3*   < > 8.5* 8.3* 8.1* 8.0* 8.5*  MG 2.0  --   --   --  1.7 1.6* 2.2  PHOS 2.5  --   --   --   --   --   --    < > = values in this interval not displayed.   GFR Estimated Creatinine Clearance: 54.7 mL/min (by C-G formula based on SCr of 0.98 mg/dL). Liver Function Tests: No results for input(s): AST, ALT, ALKPHOS, BILITOT, PROT, ALBUMIN in the last 168  hours. No results for input(s): LIPASE, AMYLASE in the last 168 hours. No results for input(s): AMMONIA in the last 168 hours. Coagulation profile No results for input(s): INR, PROTIME in the last 168 hours.  CBC: Recent Labs  Lab 10/10/19 0623 10/11/19 0504 10/13/19 0446 10/14/19 0638 10/15/19 0719 10/16/19 1244  WBC 64.1* 72.7* 72.7* 68.8* 73.9* 71.0*  NEUTROABS 53.9* 61.9* 61.8* 57.9* 62.4*  --   HGB 10.3* 10.2* 10.3* 10.1* 10.3* 9.9*  HCT 33.7* 32.4* 32.7* 32.1* 32.5* 31.2*  MCV 91.1 89.8 89.6 91.2 88.8 90.4  PLT 402* 408* 409* 435* 415* 390   Cardiac Enzymes: No results for input(s): CKTOTAL, CKMB, CKMBINDEX, TROPONINI in the last 168 hours. BNP (last 3 results) No results for input(s): PROBNP in the last 8760 hours. CBG: No results for input(s): GLUCAP in the last 168 hours. D-Dimer: No results for input(s): DDIMER in the last 72 hours. Hgb A1c: No results for input(s): HGBA1C in the last 72 hours. Lipid Profile: No results for input(s): CHOL, HDL, LDLCALC, TRIG, CHOLHDL, LDLDIRECT in the last 72 hours. Thyroid function studies: No results for input(s): TSH, T4TOTAL, T3FREE, THYROIDAB in the last 72 hours.  Invalid input(s): FREET3 Anemia work up: No results for input(s): VITAMINB12, FOLATE, FERRITIN, TIBC, IRON, RETICCTPCT in the last 72 hours. Sepsis Labs: Recent Labs  Lab 10/13/19 0446 10/14/19 0638 10/15/19 0719 10/16/19 1244  WBC 72.7* 68.8* 73.9* 71.0*    Microbiology No results found for this or any previous visit (from the past 240 hour(s)).  Procedures and diagnostic studies:  No results found.  Medications:   . Chlorhexidine Gluconate Cloth  6 each Topical Daily  . enoxaparin (LOVENOX) injection  40 mg Subcutaneous Q24H  . feeding supplement (ENSURE ENLIVE)  237 mL Oral TID BM  . mirabegron ER  50 mg Oral Daily  . [START ON 10/17/2019] multivitamin with minerals  1 tablet Oral Daily  . sodium chloride flush  5 mL Intravenous Q8H    Continuous Infusions: . sodium chloride Stopped (10/14/19 1547)  . ceFEPime (MAXIPIME) IV Stopped (10/15/19 2320)  . dextrose 5 % and 0.45% NaCl 100 mL/hr at 10/16/19 0640  . metronidazole 500 mg (10/16/19 0326)  . vancomycin Stopped (10/16/19 0129)     LOS: 11 days   Joshus Rogan  [facility]   *Please refer to [email or web address], password [email or web address] to get updated schedule on who will round on this patient, as hospitalists switch teams weekly. If 7PM-7AM, please contact night-coverage at [email or web address], password [email or web address] for any overnight needs.  10/16/2019, 2:35 PM

## 2019-10-16 NOTE — Consult Note (Signed)
NAME: Vincent Poole  DOB: 10-28-1935  MRN: [id number]  Date/Time: 10/16/2019 2:51 PM  REQUESTING PROVIDER:Ayiku Subjective:  REASON FOR CONSULT: leucocytosis  Chart reviewed- History from wife and patient Pt known to me from a prior admission In April 2020- portion of medical history taken from my previous note ? Vincent Poole is a 84 y.o. with a history of extensive local bladder cancer with bilateral hydronephrosis status post nephrostomy tubes status post bilateral stents, large bowel obstruction status post loop colostomy was admitted on 10/05/2019 with bloody drainage from the lower abdomen area where the cancer is extending to the skin . He did not have any fever at home .Vitals in the ED showed temp of 98.6, BP 122/80, HR of 130 CT abdomen was done which showed worsening bladder mass as well as superficial abscess to the skin.  Urinalysis showed infected urine.  The white count was high.  At 63,000, lactate of 2.7. Was started on antibiotics.  He was seen by surgeon. Thought not to be a candidate for any intervention  He was initially diagnosed with bladder ca in Feb 2019 , he had locally advanced muscle invasive bladder ca and has had ureteral stents placed Feb 2019. He also had TRUBT in march 2019, He got chemo with 5 cycles of gemcitabine and carboplatin and radiation. In June 2019 he had a fall and fractured his rt femur and underwent Open reduction and internal fixation of arightintertrochanteric femur fracture on 05/03/19  Sept 2019 a  biopsy of bladder neck showed recurrent high grade urothelial carcinoma He was started on palliative Tecentriq in sept 2019 by oncologist - he has been getting frequent ureteral stent changes and also has a chronic foley. He has a a contracted bladder < 50 cc and  the foley is being changed over a wire. Since feb 2020 his wbc has been gradually going up and he received 2 prolonged courses of antibiotic for enterococcus fecalis in the urine  cultured on 2/27 and 02/04/19. In April 2020 was admitted for ongoing leucocytosis around 40 K and had b/l nephrostomy tubes placed which did not impact the leukocytosis.  Patient has been following with oncologist and urologist as outpatient.  He has been getting Tecentriq.  He was readmitted in May 2020.  With urinary retention.  The nephrostomy tubes were changed by IR.  The urine culture had Serratia marcescens and he was treated with IV antibiotics initially and then sent on p.o. Bactrim.  He had another admission in June 2020 and was seen by palliative both patient and wife wanted to continue aggressive treatment.  Patient was hospitalized between 06/06/2019 until 06/12/2019 for large bowel obstruction due to the bladder tumor and underwent fecal diversion and transfers loop colostomy on 06/08/2019.  Marland Kitchen  Patient started n a new antineoplastic agent   PADCEV-Enfortumab as the bladder tumor was progressing.  Past Medical History:  Diagnosis Date  . Anxiety   . Arthritis   . Bladder cancer ([place])   . Dysrhythmia 07/2018   history of atrial flutter that worsens with anxiety  . Femur fracture, right ([place]) 05/01/2018  . GERD (gastroesophageal reflux disease)   . History of recent blood transfusion 05/2018  . Hypertension   . Iron deficiency anemia 09/14/2018  . Prostate cancer ([place]) 07/2018   cancer growing in prostate but not prostate cancer, it is from the bladder  . Umbilical hernia 19/1478  . Urinary retention 2019   foley catheter place 11/2017  . UTI (urinary tract  infection) 2019   frequent UTI's over last year  . Wound eschar of foot 07/2018   left heal getting wrapped and requiring antibiotic cream. cracks open with weight bearing.    Past Surgical History:  Procedure Laterality Date  . CARPAL TUNNEL RELEASE Right   . CHOLECYSTECTOMY  2004  . COLOSTOMY N/A 06/08/2019   Procedure: COLOSTOMY;  Surgeon: Herbert Pun, MD;  Location: [facility];  Service: General;  Laterality: N/A;  .  CYSTOGRAM  07/18/2018   Procedure: CYSTOGRAM;  Surgeon: [name], MD;  Location: [facility];  Service: Urology;;  . Consuela Mimes W/ RETROGRADES Bilateral 07/18/2018   Procedure: CYSTOSCOPY WITH RETROGRADE PYELOGRAM;  Surgeon: [name], MD;  Location: [facility];  Service: Urology;  Laterality: Bilateral;  . CYSTOSCOPY W/ URETERAL STENT PLACEMENT Bilateral 12/27/2017   Procedure: CYSTOSCOPY WITH RETROGRADE PYELOGRAM/URETERAL STENT PLACEMENT;  Surgeon: [name], MD;  Location: [facility];  Service: Urology;  Laterality: Bilateral;  . CYSTOSCOPY W/ URETERAL STENT PLACEMENT Bilateral 07/18/2018   Procedure: CYSTOSCOPY WITH STENT REPLACEMENT (exchange);  Surgeon: [name], MD;  Location: [facility];  Service: Urology;  Laterality: Bilateral;  . CYSTOSCOPY WITH STENT PLACEMENT Bilateral 11/12/2018   Procedure: [place] WITH STENT Exchange;  Surgeon: [name], MD;  Location: [facility];  Service: Urology;  Laterality: Bilateral;  . INTRAMEDULLARY (IM) NAIL INTERTROCHANTERIC Right 05/02/2018   Procedure: INTRAMEDULLARY (IM) NAIL INTERTROCHANTRIC;  Surgeon: [name], MD;  Location: [facility];  Service: Orthopedics;  Laterality: Right;  . IR NEPHROSTOMY EXCHANGE LEFT  03/25/2019  . IR NEPHROSTOMY EXCHANGE LEFT  04/19/2019  . IR NEPHROSTOMY EXCHANGE LEFT  05/31/2019  . IR NEPHROSTOMY EXCHANGE LEFT  07/19/2019  . IR NEPHROSTOMY EXCHANGE LEFT  09/13/2019  . IR NEPHROSTOMY EXCHANGE RIGHT  03/25/2019  . IR NEPHROSTOMY EXCHANGE RIGHT  04/19/2019  . IR NEPHROSTOMY EXCHANGE RIGHT  05/31/2019  . IR NEPHROSTOMY EXCHANGE RIGHT  07/19/2019  . IR NEPHROSTOMY EXCHANGE RIGHT  09/13/2019  . IR NEPHROSTOMY PLACEMENT LEFT  02/23/2019  . IR NEPHROSTOMY PLACEMENT RIGHT  02/23/2019  . LAPAROTOMY N/A 06/08/2019   Procedure: EXPLORATORY LAPAROTOMY;  Surgeon: Herbert Pun, MD;  Location: [facility];  Service: General;  Laterality: N/A;  . LEG TENDON SURGERY Right 1958  . PORTA CATH INSERTION N/A 01/22/2018    Procedure: PORTA CATH INSERTION;  Surgeon: [name], MD;  Location: [place] [facility];  Service: Cardiovascular;  Laterality: N/A;  . TRANSURETHRAL RESECTION OF BLADDER TUMOR N/A 12/27/2017   Procedure: TRANSURETHRAL RESECTION OF BLADDER TUMOR (TURBT);  Surgeon: [name], MD;  Location: [facility];  Service: Urology;  Laterality: N/A;  . TRANSURETHRAL RESECTION OF BLADDER TUMOR N/A 01/15/2018   Procedure: TRANSURETHRAL RESECTION OF BLADDER TUMOR (TURBT);  Surgeon: [name], MD;  Location: [facility];  Service: Urology;  Laterality: N/A;  Need 2 hrs for this case please  . TRANSURETHRAL RESECTION OF BLADDER TUMOR N/A 07/18/2018   Procedure: TRANSURETHRAL RESECTION OF BLADDER TUMOR (TURBT);  Surgeon: [name], MD;  Location: [facility];  Service: Urology;  Laterality: N/A;    Social History   Socioeconomic History  . Marital status: Married    Spouse name: ruth  . Number of children: Not on file  . Years of education: Not on file  . Highest education level: Not on file  Occupational History  . Occupation: retired    Comment: Therapist, [employer]  Social Needs  . Financial resource strain: Patient refused  . Food insecurity    Worry: Patient refused  Inability: Patient refused  . Transportation needs    Medical: Patient refused    Non-medical: Patient refused  Tobacco Use  . Smoking status: Never Smoker  . Smokeless tobacco: Never Used  Substance and Sexual Activity  . Alcohol use: No    Alcohol/week: 0.0 standard drinks  . Drug use: No  . Sexual activity: Not Currently  Lifestyle  . Physical activity    Days per week: Patient refused    Minutes per session: Patient refused  . Stress: Patient refused  Relationships  . Social Herbalist on phone: Patient refused    Gets together: Patient refused    Attends religious service: Patient refused    Active member of club or organization: Patient refused    Attends meetings of clubs or organizations:  Patient refused    Relationship status: Patient refused  . Intimate partner violence    Fear of current or ex partner: Patient refused    Emotionally abused: Patient refused    Physically abused: Patient refused    Forced sexual activity: Patient refused  Other Topics Concern  . Not on file  Social History Narrative  . Not on file    Family History  Problem Relation Age of Onset  . Cancer Mother   . Chronic Renal Failure Mother   . Heart disease Father    No Known Allergies  ? Current Facility-Administered Medications  Medication Dose Route Frequency Provider Last Rate Last Dose  . 0.9 %  sodium chloride infusion   Intravenous PRN Wouk, Ailene Rud, MD   Stopped at 10/14/19 1547  . ceFEPIme (MAXIPIME) 2 g in sodium chloride 0.9 % 100 mL IVPB  2 g Intravenous Q12H Dallie Piles, [facility]   Stopped at 10/15/19 2320  . Chlorhexidine Gluconate Cloth 2 % PADS 6 each  6 each Topical Daily Jennye Boroughs, MD   6 each at 10/15/19 1459  . dextrose 5 %-0.45 % sodium chloride infusion   Intravenous Continuous Dallie Piles, RPH 100 mL/hr at 10/16/19 0640    . enoxaparin (LOVENOX) injection 40 mg  40 mg Subcutaneous Q24H Jennye Boroughs, MD   40 mg at 10/15/19 2253  . feeding supplement (ENSURE ENLIVE) (ENSURE ENLIVE) liquid 237 mL  237 mL Oral TID BM Jennye Boroughs, MD   237 mL at 10/15/19 1458  . metroNIDAZOLE (FLAGYL) IVPB 500 mg  500 mg Intravenous Q8H Wouk, Ailene Rud, MD 100 mL/hr at 10/16/19 0326 500 mg at 10/16/19 0326  . mirabegron ER (MYRBETRIQ) tablet 50 mg  50 mg Oral Daily Gwynne Edinger, MD   50 mg at 10/15/19 1206  . [START ON 10/17/2019] multivitamin with minerals tablet 1 tablet  1 tablet Oral Daily Jennye Boroughs, MD      . oxyCODONE (Oxy IR/ROXICODONE) immediate release tablet 5 mg  5 mg Oral Q4H PRN Gwynne Edinger, MD   5 mg at 10/14/19 2221  . sodium chloride flush (NS) 0.9 % injection 5 mL  5 mL Intravenous Q8H Sreenath, Sudheer B, MD   5 mL at 10/16/19 0559  .  vancomycin (VANCOCIN) 1,250 mg in sodium chloride 0.9 % 250 mL IVPB  1,250 mg Intravenous Q36H Hallaji, Dani Gobble, RPH   Stopped at 10/16/19 0129     Abtx:  Anti-infectives (From admission, onward)   Start     Dose/Rate Route Frequency Ordered Stop   10/14/19 1000  vancomycin (VANCOCIN) 1,250 mg in sodium chloride 0.9 % 250 mL IVPB  1,250 mg 166.7 mL/hr over 90 Minutes Intravenous Every 36 hours 10/13/19 2226     10/10/19 2100  vancomycin (VANCOCIN) 1,250 mg in sodium chloride 0.9 % 250 mL IVPB  Status:  Discontinued     1,250 mg 166.7 mL/hr over 90 Minutes Intravenous Every 24 hours 10/10/19 1318 10/13/19 2213   10/10/19 1000  ceFEPIme (MAXIPIME) 2 g in sodium chloride 0.9 % 100 mL IVPB     2 g 200 mL/hr over 30 Minutes Intravenous Every 12 hours 10/10/19 0736     10/06/19 2100  vancomycin (VANCOCIN) 1,500 mg in sodium chloride 0.9 % 500 mL IVPB  Status:  Discontinued     1,500 mg 250 mL/hr over 120 Minutes Intravenous Every 24 hours 10/05/19 2215 10/06/19 0938   10/06/19 2100  vancomycin (VANCOCIN) 1,500 mg in sodium chloride 0.9 % 500 mL IVPB  Status:  Discontinued     1,500 mg 250 mL/hr over 120 Minutes Intravenous Every 24 hours 10/06/19 0939 10/10/19 1318   10/06/19 1000  ceFEPIme (MAXIPIME) 2 g in sodium chloride 0.9 % 100 mL IVPB  Status:  Discontinued     2 g 200 mL/hr over 30 Minutes Intravenous Every 8 hours 10/06/19 0946 10/10/19 0736   10/06/19 0100  metroNIDAZOLE (FLAGYL) IVPB 500 mg     500 mg 100 mL/hr over 60 Minutes Intravenous Every 8 hours 10/05/19 1924     10/05/19 2200  ceFEPIme (MAXIPIME) 2 g in sodium chloride 0.9 % 100 mL IVPB  Status:  Discontinued     2 g 200 mL/hr over 30 Minutes Intravenous Every 12 hours 10/05/19 1932 10/06/19 0946   10/05/19 2000  vancomycin (VANCOCIN) 1,500 mg in sodium chloride 0.9 % 500 mL IVPB     1,500 mg 250 mL/hr over 120 Minutes Intravenous  Once 10/05/19 1932 10/05/19 2356   10/05/19 1600  cefTRIAXone (ROCEPHIN) 2 g in sodium  chloride 0.9 % 100 mL IVPB     2 g 200 mL/hr over 30 Minutes Intravenous  Once 10/05/19 1552 10/05/19 1722   10/05/19 1600  metroNIDAZOLE (FLAGYL) IVPB 500 mg     500 mg 100 mL/hr over 60 Minutes Intravenous  Once 10/05/19 1552 10/05/19 1832      REVIEW OF SYSTEMS:  Const: negative fever, negative chills, negative weight loss Eyes: negative diplopia or visual changes, negative eye pain ENT: negative coryza, negative sore throat Resp: negative cough, hemoptysis, dyspnea Cards: negative for chest pain, palpitations, lower extremity edema GU: negative for frequency, dysuria and hematuria GI: has abdominal pain,, bleeding,  Skin: negative for rash and pruritus Heme: negative for easy bruising and gum/nose bleeding AS:[id number] weakness Neurolo:negative for headaches, dizziness, vertigo, memory problems  Psych: negative for feelings of anxiety, depression  Endocrine: negative for thyroid, diabetes Allergy/Immunology- negative for any medication or food allergies ?  Objective:  VITALS:  BP 119/60   Pulse (!) 109   Temp 98.1 F (36.7 C)   Resp 16   Ht 5\' 11"  (1.803 m)   Wt 66.6 kg   SpO2 98%   BMI 20.48 kg/m  PHYSICAL EXAM:  General: Alert, cooperative, no distress, appears stated age.  Head: Normocephalic, without obvious abnormality, atraumatic. Eyes: Conjunctivae clear, anicteric sclerae. Pupils are equal ENT did not examine due to mask Neck: Supple,  Lungs: b/l air entry0 decreased bases Heart: s1s2 Abdomen: Soft,colostmy   fistulous lesion on the lower abdomen covered by a colostomy bag Back- b/l nephrostomy  Extremities: atraumatic, no cyanosis. No edema. No clubbing  Skin: No rashes or lesions. Or bruising Lymph: Cervical, supraclavicular normal. Neurologic: Grossly non-focal Pertinent Labs Lab Results CBC       Component Value Date/Time   WBC 71.0 (HH) 10/16/2019 1244   RBC 3.45 (L) 10/16/2019 1244   HGB 9.9 (L) 10/16/2019 1244   HGB 11.6 (L)  02/20/2019 1404   HCT 31.2 (L) 10/16/2019 1244   HCT 36.5 (L) 02/20/2019 1404   PLT 390 10/16/2019 1244   PLT 461 (H) 02/20/2019 1404   MCV 90.4 10/16/2019 1244   MCV 94 02/20/2019 1404   MCH 28.7 10/16/2019 1244   MCHC 31.7 10/16/2019 1244   RDW 19.8 (H) 10/16/2019 1244   RDW 12.9 02/20/2019 1404   LYMPHSABS 4.2 (H) 10/15/2019 0719   LYMPHSABS 2.8 02/20/2019 1404   MONOABS 2.9 (H) 10/15/2019 0719   EOSABS 0.8 (H) 10/15/2019 0719   EOSABS 0.4 02/20/2019 1404   BASOSABS 0.1 10/15/2019 0719   BASOSABS 0.2 02/20/2019 1404    CMP Latest Ref Rng & Units 10/16/2019 10/15/2019 10/14/2019  Glucose 70 - 99 mg/dL 91 81 103(H)  BUN 8 - 23 mg/dL 11 9 9   Creatinine 0.61 - 1.24 mg/dL 0.98 1.06 0.91  Sodium 135 - 145 mmol/L 142 137 141  Potassium 3.5 - 5.1 mmol/L 4.6 2.9(L) 3.7  Chloride 98 - 111 mmol/L 110 106 112(H)  CO2 22 - 32 mmol/L 23 20(L) 22  Calcium 8.9 - 10.3 mg/dL 8.5(L) 8.0(L) 8.1(L)  Total Protein 6.5 - 8.1 g/dL - - -  Total Bilirubin 0.3 - 1.2 mg/dL - - -  Alkaline Phos 38 - 126 U/L - - -  AST 15 - 41 U/L - - -  ALT 0 - 44 U/L - - -      Microbiology: No results found for this or any previous visit (from the past 240 hour(s)).  IMAGING RESULTS: Urinary bladder is enlarged measuring approximately 8.6 x 14.5 x 8.8 cm and grossly distorted with extensive multifocal mural thickening and widespread areas of internal soft tissue thickening and enhancing soft tissue, with a combination of fluid and gas within the bladder, concerning for progressively enlarging bladder neoplasm. This appears to extend through the lower anterior abdominal wall where there is a small gas and fluid collection in the subcutaneous fat immediately superficial to the anterior abdominal wall musculature measuring 8.0 x 1.7 x 5.0 cm (axial image 68 of series 2 and sagittal image 64 of series 6). Bilateral adrenal glands are normal in appearance. I have personally reviewed the films ?  Impression/Recommendation ? ?Fungating bladder cancer extending to the abdominal wall causing abdominal wall abscess.  Seen by surgery.  Deemed not to be a surgical candidate. He has failed chemotherapy, antineoplastic therapy including Tecentriq and En[place]omab He is getting radiation to the bladder cancer now  Leucocytosis- very likely due to tumor burden, has not had any response to triple antibiotics in the past 7 days- No bacteremia- will discontinue all antibiotics  B/l uretral obstruction from bladder tumor- has b/l nephrostomy  Recent colon obstruction from outside due to bladder tumor- s/p loop colostomy  Discussed the management with patient and his wife  ___________________________________________________  Note:  This document was prepared using Dragon voice recognition software and may include unintentional dictation errors.

## 2019-10-16 NOTE — Plan of Care (Addendum)
Problem: Education: Goal: Knowledge of General Education information will improve Description: Including pain rating scale, medication(s)/side effects and non-pharmacologic comfort measures Outcome: Progressing  Problem: Education: Goal: Knowledge of General Education information will improve Description: Including pain rating scale, medication(s)/side effects and non-pharmacologic comfort measures Outcome: Progressing Pt understands POC and has no further questions.  Will continue to monitor and offer support.    Problem: Health Behavior/Discharge Planning: Goal: Ability to manage health-related needs will improve Outcome: Progressing Pt has understanding of POC and outcomes.  He is expected to D/C this am if no other issues arise.  Will continue to monitor and assess.    Problem: Clinical Measurements: Goal: Ability to maintain clinical measurements within normal limits will improve Outcome: Progressing Pt was hypokalemia prior shift.  He was supplemented for his electrolyte imbalance prior shift.  Awaiting AM labs to further assess.  Will continue to monitor and assess.    Problem: Clinical Measurements: Goal: Will remain free from infection Outcome: Progressing S/Sx of infection monitored and assessed q-shift/ PRN, along with VS.  Pt has remained afebrile thus far.  He is on IV abx per MD's orders.  Will continue to monitor and assess.    Problem: Clinical Measurements: Goal: Respiratory complications will improve Outcome: Progressing Respiratory status monitor and assessed q-shift/ PRN, along with VS.  Pt is on room air with O2 saturations at 98-99% and respiration rate of 17-20 breaths per minute.  Will continue to monitor and assess.    Problem: Clinical Measurements: Goal: Cardiovascular complication will be avoided Outcome: Progressing Pt was slightly tachycardic this shift, all other VS WNL.  Will continue to monitor and assess.   Problem: Activity: Goal: Risk for  activity intolerance will decrease 10/16/2019 0422 by Bjorn Pippin, RN Outcome: Progressing Pt is x1 assist to the BR.  He is able to reposition himself in the bed with little assistance from RN staff.  Will continue to monitor and assess.   Problem: Nutrition: Goal: Adequate nutrition will be maintained 10/16/2019 0422 by Bjorn Pippin, RN Outcome: Progressing Pt is a regular diet and is tolerating it well.  Will continue to monitor and assess.   Problem: Coping: Goal: Level of anxiety will decrease 10/16/2019 0422 by Bjorn Pippin, RN Outcome: Progressing Pt has not expressed c/o of anxiety r/t her hospitalization.  Will continue to monitor and assess.   Problem: Elimination: Goal: Will not experience complications related to bowel motility 10/16/2019 0422 by Bjorn Pippin, RN Outcome: Progressing Pt has bilateral PCN for urinary out and a colostomy to contain his Bm's.   Will continue to monitor and assess.   Problem: Elimination: Goal: Will not experience complications related to urinary retention 10/16/2019 0422 by Bjorn Pippin, RN Outcome: Progressing Pt has bilateral PCN for urinary out and a colostomy to contain his BM's.   Will continue to monitor and assess. Will continue to monitor and assess.     Problem: Pain Managment: Goal: General experience of comfort will improve 10/16/2019 0422 by Bjorn Pippin, RN Outcome: Progressing Pt has denied pain thus far.  Will continue to monitor and assess.   Problem: Safety: Goal: Ability to remain free from injury will improve 10/16/2019 0422 by Bjorn Pippin, RN Outcome: Progressing Instructed pt to utilize RN call light for assistance.  Hourly rounds performed.  Bed alarm implemented to keep pt safe from falls.  Bed in lowest position, locked with two upper/ one lower side rails engaged.  Belongings and call light  within reach.     Problem: Skin Integrity: Goal: Risk for impaired skin integrity will decrease 10/16/2019 0349 by  Bjorn Pippin, RN Outcome: Progressing Skin integrity monitored and assessed q-shift/ PRN.  Instructed pt to turn q2 hours to prevent further skin impairment.  Tubes and drains assessed for device related pressure sores.  Dressing changes performed per MD's orders.  Will continue to monitor and assess.

## 2019-10-17 ENCOUNTER — Encounter: Payer: Self-pay | Admitting: Oncology

## 2019-10-17 ENCOUNTER — Other Ambulatory Visit: Payer: Self-pay

## 2019-10-17 ENCOUNTER — Ambulatory Visit
Admission: RE | Admit: 2019-10-17 | Discharge: 2019-10-17 | Disposition: A | Discharge status: Home / Self Care | Payer: Medicare Other | Source: Ambulatory Visit | Attending: Radiation Oncology | Admitting: Radiation Oncology

## 2019-10-17 DIAGNOSIS — R1900 Intra-abdominal and pelvic swelling, mass and lump, unspecified site: Secondary | ICD-10-CM

## 2019-10-17 DIAGNOSIS — D72823 Leukemoid reaction: Secondary | ICD-10-CM

## 2019-10-17 DIAGNOSIS — K56609 Unspecified intestinal obstruction, unspecified as to partial versus complete obstruction: Secondary | ICD-10-CM

## 2019-10-17 LAB — CBC WITH DIFFERENTIAL/PLATELET
Abs Immature Granulocytes: 3.03 10*3/uL — ABNORMAL HIGH (ref 0.00–0.07)
Basophils Absolute: 0.1 10*3/uL (ref 0.0–0.1)
Basophils Relative: 0 %
Eosinophils Absolute: 0.9 10*3/uL — ABNORMAL HIGH (ref 0.0–0.5)
Eosinophils Relative: 1 %
HCT: 30.5 % — ABNORMAL LOW (ref 39.0–52.0)
Hemoglobin: 9.9 g/dL — ABNORMAL LOW (ref 13.0–17.0)
Immature Granulocytes: 5 %
Lymphocytes Relative: 5 %
Lymphs Abs: 3.3 10*3/uL (ref 0.7–4.0)
MCH: 28.4 pg (ref 26.0–34.0)
MCHC: 32.5 g/dL (ref 30.0–36.0)
MCV: 87.4 fL (ref 80.0–100.0)
Monocytes Absolute: 2.8 10*3/uL — ABNORMAL HIGH (ref 0.1–1.0)
Monocytes Relative: 4 %
Neutro Abs: 54.2 10*3/uL — ABNORMAL HIGH (ref 1.7–7.7)
Neutrophils Relative %: 85 %
Platelets: 383 10*3/uL (ref 150–400)
RBC: 3.49 MIL/uL — ABNORMAL LOW (ref 4.22–5.81)
RDW: 19.7 % — ABNORMAL HIGH (ref 11.5–15.5)
WBC: 64.4 10*3/uL (ref 4.0–10.5)
nRBC: 0 % (ref 0.0–0.2)

## 2019-10-17 LAB — BASIC METABOLIC PANEL
Anion gap: 9 (ref 5–15)
BUN: 11 mg/dL (ref 8–23)
CO2: 22 mmol/L (ref 22–32)
Calcium: 8.2 mg/dL — ABNORMAL LOW (ref 8.9–10.3)
Chloride: 108 mmol/L (ref 98–111)
Creatinine, Ser: 0.72 mg/dL (ref 0.61–1.24)
GFR calc Af Amer: >60 mL/min (ref >60)
GFR calc non Af Amer: >60 mL/min (ref >60)
Glucose, Bld: 86 mg/dL (ref 70–99)
Potassium: 3.5 mmol/L (ref 3.5–5.1)
Sodium: 139 mmol/L (ref 135–145)

## 2019-10-17 LAB — MAGNESIUM: Magnesium: 1.8 mg/dL (ref 1.7–2.4)

## 2019-10-17 MED ORDER — POTASSIUM CHLORIDE 10 MEQ/100ML IV SOLN
10.0000 meq | Freq: Once | INTRAVENOUS | Status: AC
  Administered 2019-10-17: 10 meq via INTRAVENOUS
  Filled 2019-10-17: qty 100

## 2019-10-17 NOTE — Progress Notes (Signed)
Progress Note    Vincent Poole  MWU:[id number] DOB: 1935/04/16  DOA: 10/05/2019 PCP: [name], MD      Brief Narrative:    Medical records reviewed and are as summarized below:  Vincent Poole is an 84 y.o. male with medical history significant forlocally-metastatic bladder cancer on chemotherapy, urinary tract obstruction with b/l nephrostomy tubes in place, recurrent UTI, hypertension, colostomy in place, provoked dvt s/p anticoagulation, who presents with a red "lump" on his lower abdomen in the suprapubic region for several months.  This was noted at recent hospitalization, thought to be 2/2 metastatic bladder cancer.       Assessment/Plan:   Principal Problem:   Intra-abdominal infection Active Problems:   Sepsis ([place])   Essential hypertension   Prostate cancer (HCC)   Urinary obstruction   Malignant neoplasm of urinary bladder (HCC)   Pressure injury of skin   S/P partial resection of colon   Intra-abdominal abscess (HCC)   Body mass index is 20.48 kg/m.    Sepsis secondary to abdominal wall abscess/significant leukocytosis: Blood cultures did not show any growth.  Consulted ID specialist for guidance on antibiotics and she recommended discontinuation of all antibiotics.  Of note, patient was previously seen by surgeon and he is not a surgical candidate.  Patient has been informed that he will likely be discharged in the next few days to follow-up with his oncologist as an outpatient.  Sinus tachycardia: This is likely due to underlying abdominal wall abscess/malignancy  Metastatic bladder cancer: Follow-up with oncologist.  He has been getting radiation therapy while inpatient.  Patient has been evaluated by the palliative care team and he and his wife want to continue treatment for now.  Acute on chronic leukocytosis: Likely worsened by infection  Hypokalemia and hypomagnesemia: Improved.   Bilateral nephrostomy tubes: Continue  nephrostomy care per nursing.  Stage I sacral decubitus ulcer: This was present on admission.  Continue local wound care.   Family Communication/Anticipated D/C date and plan/Code Status   DVT prophylaxis: SCDs, Lovenox Code Status: DNR Family Communication: None Disposition Plan: To be determined      Subjective:   No complaints. He feels ok.    Objective:    Vitals:   10/16/19 0920 10/16/19 1253 10/16/19 1931 10/17/19 0427  BP:  119/60 (!) 116/53 124/77  Pulse: (!) 107 (!) 109 (!) 105 (!) 119  Resp:  16 20 20   Temp:  98.1 F (36.7 C) 97.9 F (36.6 C) 98 F (36.7 C)  TempSrc:   Oral Oral  SpO2: 97% 98% 96% 97%  Weight:      Height:        Intake/Output Summary (Last 24 hours) at 10/17/2019 1016 Last data filed at 10/17/2019 1000 Gross per 24 hour  Intake 590 ml  Output 1260 ml  Net -670 ml   Filed Weights   10/05/19 1418 10/05/19 2139  Weight: 65.8 kg 66.6 kg    Exam:  GEN: NAD SKIN: No rash EYES: Anicteric ENT: MMM CV: RRR PULM: CTA B ABD: soft, ND, NT, +BS, + colostomy,+ drain from lower abdominal wall CNS: AAO x 3, non focal EXT: No edema or tenderness GU: Bilateral nephrostomy tubes draining amber urine  Data Reviewed:   I have personally reviewed following labs and imaging studies:  Labs: Labs show the following:   Basic Metabolic Panel: Recent Labs  Lab 10/13/19 0446 10/14/19 0638 10/15/19 0719 10/16/19 0357 10/17/19 0326  NA 139 141 137 142  139  K 3.3* 3.7 2.9* 4.6 3.5  CL 107 112* 106 110 108  CO2 21* 22 20* 23 22  GLUCOSE 50* 103* 81 91 86  BUN 10 9 9 11 11   CREATININE 1.02 0.91 1.06 0.98 0.72  CALCIUM 8.3* 8.1* 8.0* 8.5* 8.2*  MG  --  1.7 1.6* 2.2 1.8   GFR Estimated Creatinine Clearance: 67.1 mL/min (by C-G formula based on SCr of 0.72 mg/dL). Liver Function Tests: No results for input(s): AST, ALT, ALKPHOS, BILITOT, PROT, ALBUMIN in the last 168 hours. No results for input(s): LIPASE, AMYLASE in the last 168  hours. No results for input(s): AMMONIA in the last 168 hours. Coagulation profile No results for input(s): INR, PROTIME in the last 168 hours.  CBC: Recent Labs  Lab 10/11/19 0504 10/13/19 0446 10/14/19 0638 10/15/19 0719 10/16/19 1244 10/17/19 0326  WBC 72.7* 72.7* 68.8* 73.9* 71.0* 64.4*  NEUTROABS 61.9* 61.8* 57.9* 62.4*  --  54.2*  HGB 10.2* 10.3* 10.1* 10.3* 9.9* 9.9*  HCT 32.4* 32.7* 32.1* 32.5* 31.2* 30.5*  MCV 89.8 89.6 91.2 88.8 90.4 87.4  PLT 408* 409* 435* 415* 390 383   Cardiac Enzymes: No results for input(s): CKTOTAL, CKMB, CKMBINDEX, TROPONINI in the last 168 hours. BNP (last 3 results) No results for input(s): PROBNP in the last 8760 hours. CBG: No results for input(s): GLUCAP in the last 168 hours. D-Dimer: No results for input(s): DDIMER in the last 72 hours. Hgb A1c: No results for input(s): HGBA1C in the last 72 hours. Lipid Profile: No results for input(s): CHOL, HDL, LDLCALC, TRIG, CHOLHDL, LDLDIRECT in the last 72 hours. Thyroid function studies: No results for input(s): TSH, T4TOTAL, T3FREE, THYROIDAB in the last 72 hours.  Invalid input(s): FREET3 Anemia work up: No results for input(s): VITAMINB12, FOLATE, FERRITIN, TIBC, IRON, RETICCTPCT in the last 72 hours. Sepsis Labs: Recent Labs  Lab 10/14/19 0638 10/15/19 0719 10/16/19 1244 10/17/19 0326  WBC 68.8* 73.9* 71.0* 64.4*    Microbiology No results found for this or any previous visit (from the past 240 hour(s)).  Procedures and diagnostic studies:  No results found.  Medications:   . Chlorhexidine Gluconate Cloth  6 each Topical Daily  . enoxaparin (LOVENOX) injection  40 mg Subcutaneous Q24H  . feeding supplement (ENSURE ENLIVE)  237 mL Oral TID BM  . mirabegron ER  50 mg Oral Daily  . multivitamin with minerals  1 tablet Oral Daily  . sodium chloride flush  5 mL Intravenous Q8H   Continuous Infusions: . sodium chloride Stopped (10/14/19 1547)  . dextrose 5 % and 0.45%  NaCl 100 mL/hr at 10/17/19 0755  . potassium chloride       LOS: 12 days   Cimberly Stoffel  [facility]   *Please refer to [email or web address], password [email or web address] to get updated schedule on who will round on this patient, as hospitalists switch teams weekly. If 7PM-7AM, please contact night-coverage at [email or web address], password [email or web address] for any overnight needs.  10/17/2019, 10:16 AM

## 2019-10-17 NOTE — Evaluation (Deleted)
Occupational Therapy Evaluation Patient Details Name: Vincent Poole MRN: [id number] DOB: 12-Jul-1935 Today's Date: 10/17/2019    History of Present Illness Pt. is an 84 y.o. male with medical history significant for locally-metastatic bladder cancer on chemotherapy, urinary tract obstruction with b/l nephrostomy tubes in place, recurrent UTI, htn, colostomy in place. Pt with PMH of GERD, anxiety, arthritis, history of atrial flutter, R femur fx, HTN, prostate cancer, umbilical hernia, wound eschar of L foot, multiple bladder surgeries/procedures. Pt presents to ED with a red "lump" on his lower abdomen in the suprapubic region for several months, has open/draining; noted at recent hospitalization thought to be 2/2 metastatic bladder cancer. Pt given broad spectrum abx and CT abd ordered.   Clinical Impression   Pt. Has made progress since the session yesterday. Pt. Required ModA supine to sit to the EOB. Pt. Was able to initiate with his LEs. Pt. Required ModA to align, and maneuver his trunk. Assist was provided at key points of control. Pt. Was able to tolerate sitting supported sitting at the EOB using using UEs for 15 min. With HR at 72-83 bpms. HR increased to 121 bpms while performing supine to sit , and quickly recovered. Pt. was assisted back to bed, and required assist to perform sit to supine with LE's and trunk, and to slide towards the [facility]. Pt. was able to perform light self-grooming tasks once back at bed level. Pt. continues to benefit from OT services for ADL training, A/E training, there ex. and pt. education about home modification, and DME. Pt. Plans to return home upon discharge with family to assist pt. As needed. Pt.'s family was assisting pt. prior to hospitalization. Pt. Could benefit from follow-up [place] services, and a home care aide upon discharge.    Follow Up Recommendations  Home health OT, home care aide   Equipment Recommendations       Recommendations for Other  Services       Precautions / Restrictions Precautions Precautions: Fall Precaution Comments: bilateral nephrostomy tubes, colostomy; abdominal drain Restrictions Weight Bearing Restrictions: No      Mobility Bed Mobility Overal bed mobility: Needs Assistance Bed Mobility: Supine to Sit;Sit to Supine     Supine to sit: Mod assist Sit to supine: Min assist   General bed mobility comments: Pt. was able to initaite rolling, and maneuver the bilateral LE's, Pt. required mod for trunk with assist provided at key points of control. Pt. is improving with sitting tolerance at EOB today.  Transfers                 General transfer comment: Deferred    Balance Overall balance assessment: Needs assistance Sitting-balance support: Feet supported Sitting balance-Leahy Scale: Fair                                     ADL either performed or assessed with clinical judgement   ADL Overall ADL's : Needs assistance/impaired Eating/Feeding: Set up;Bed level   Grooming: Set up;Independent;Bed level;Maximal assistance;Sitting   Upper Body Bathing: Set up;Moderate assistance   Lower Body Bathing: Set up;Maximal assistance   Upper Body Dressing : Set up;Moderate assistance   Lower Body Dressing: Set up;Maximal assistance   Toilet Transfer: Total assistance                   Vision Baseline Vision/History: No visual deficits       Perception  Praxis      Pertinent Vitals/Pain Pain Assessment: No/denies pain     Hand Dominance Right   Extremity/Trunk Assessment Upper Extremity Assessment Upper Extremity Assessment: Generalized weakness           Communication Communication Communication: HOH   Cognition Arousal/Alertness: Awake/alert Behavior During Therapy: WFL for tasks assessed/performed Overall Cognitive Status: Within Functional Limits for tasks assessed                                     General Comments        Exercises     Shoulder Instructions      Home Living Family/patient expects to be discharged to:: Private residence Living Arrangements: Spouse/significant other Available Help at Discharge: Family;Available 24 hours/day Type of Home: House Home Access: Stairs to enter [employer] of Steps: 4 Entrance Stairs-Rails: Can reach both Home Layout: One level     Bathroom Shower/Tub: Biomedical scientist: Standard     Home Equipment: Toilet riser;Cane - single point;Walker - 2 wheels;Hospital bed;Wheelchair - manual;Bedside commode          Prior Functioning/Environment Level of Independence: Needs assistance  Gait / Transfers Assistance Needed: Has required assistance +1 with bed mobility due to pain at suprapubic region. Requires assistance +1 with STS transfer and ambulation primarily for balance. ADL's / Homemaking Assistance Needed: Patient requires help with morning care bathing, and dressing, meal preparation,a nd medication managemetnfrom his wife.   Comments: Per wife: she lives with pt, and children live very close by and also assist frequently; someone is there 24/7        OT Problem List: Decreased strength;Decreased activity tolerance;Decreased range of motion;Decreased knowledge of use of DME or AE;Impaired balance (sitting and/or standing)      OT Treatment/Interventions: Self-care/ADL training;Therapeutic exercise;Neuromuscular education;Patient/family education;Energy conservation;DME and/or AE instruction;Therapeutic activities    OT Goals(Current goals can be found in the care plan section) Acute Rehab OT Goals Patient Stated Goal: Pt. be able to do more by himself OT Goal Formulation: With patient Potential to Achieve Goals: Good  OT Frequency: Min 2X/week   Barriers to D/C:            Co-evaluation              AM-PAC OT "6 Clicks" Daily Activity     Outcome Measure Help from another person eating meals?:  A Little Help from another person taking care of personal grooming?: A Little Help from another person toileting, which includes using toliet, bedpan, or urinal?: A Lot   Help from another person to put on and taking off regular upper body clothing?: A Lot Help from another person to put on and taking off regular lower body clothing?: A Lot 6 Click Score: 12   End of Session    Activity Tolerance: Patient tolerated treatment well Patient left: in bed;with call bell/phone within reach;with bed alarm set  OT Visit Diagnosis: Muscle weakness (generalized) (M62.81)                Time: 9937-1696 OT Time Calculation (min): 30 min Charges:  OT General Charges $OT Visit: 1 Visit OT Treatments $Self Care/Home Management : 23-37 mins  Harrel Carina, MS, OTR/L  Harrel Carina 10/17/2019, 12:24 PM

## 2019-10-17 NOTE — Progress Notes (Signed)
Notified Dr. Mal Misty that patients penis has some swelling and patient reports some tenderness. IVF were stopped today, will continue to monitor.

## 2019-10-17 NOTE — Progress Notes (Unsigned)
Patient is currently in the hospital.

## 2019-10-17 NOTE — Care Management Important Message (Signed)
Important Message  Patient Details  Name: Vincent Poole MRN: [id number] Date of Birth: 01/23/1935   Medicare Important Message Given:  Yes     Dannette Barbara 10/17/2019, 2:17 PM

## 2019-10-17 NOTE — Progress Notes (Signed)
Occupational Therapy Treatment Note Patient Details Name: Vincent Poole MRN: [id number] DOB: 01/24/1935 Today's Date: 10/17/2019    History of Present Illness Pt. is an 84 y.o. male with medical history significant for locally-metastatic bladder cancer on chemotherapy, urinary tract obstruction with b/l nephrostomy tubes in place, recurrent UTI, htn, colostomy in place. Pt with PMH of GERD, anxiety, arthritis, history of atrial flutter, R femur fx, HTN, prostate cancer, umbilical hernia, wound eschar of L foot, multiple bladder surgeries/procedures. Pt presents to ED with a red "lump" on his lower abdomen in the suprapubic region for several months, has open/draining; noted at recent hospitalization thought to be 2/2 metastatic bladder cancer. Pt given broad spectrum abx and CT abd ordered.   Clinical Impression   Pt. Has made progress since the session yesterday. Pt. Required ModA supine to sit to the EOB. Pt. Was able to initiate with his LEs. Pt. Required ModA to align, and maneuver his trunk. Assist was provided at key points of control. Pt. Was able to tolerate sitting supported sitting at the EOB using using UEs for 15 min. With HR at 72-83 bpms. HR increased to 121 bpms while performing supine to sit , and quickly recovered. Pt. was assisted back to bed, and required assist to perform sit to supine with LE's and trunk, and to slide towards the [facility]. Pt. was able to perform light self-grooming tasks once back at bed level. Pt. continues to benefit from OT services for ADL training, A/E training, there ex. and pt. education about home modification, and DME. Pt. Plans to return home upon discharge with family to assist pt. As needed. Pt.'s family was assisting pt. prior to hospitalization. Pt. Could benefit from follow-up [place] services, and a home care aide upon discharge.    Follow Up Recommendations  Home health OT, home care aide   Equipment Recommendations       Recommendations for Other  Services       Precautions / Restrictions Precautions Precautions: Fall Precaution Comments: bilateral nephrostomy tubes, colostomy; abdominal drain Restrictions Weight Bearing Restrictions: No      Mobility Bed Mobility Overal bed mobility: Needs Assistance Bed Mobility: Supine to Sit;Sit to Supine     Supine to sit: Mod assist Sit to supine: Min assist   General bed mobility comments: Pt. was able to initaite rolling, and maneuver the bilateral LE's, Pt. required mod for trunk with assist provided at key points of control. Pt. is improving with sitting tolerance at EOB today.  Transfers                 General transfer comment: Deferred    Balance Overall balance assessment: Needs assistance Sitting-balance support: Feet supported Sitting balance-Leahy Scale: Fair                                     ADL either performed or assessed with clinical judgement   ADL Overall ADL's : Needs assistance/impaired Eating/Feeding: Set up;Bed level   Grooming: Set up;Independent;Bed level;Maximal assistance;Sitting   Upper Body Bathing: Set up;Moderate assistance   Lower Body Bathing: Set up;Maximal assistance   Upper Body Dressing : Set up;Moderate assistance   Lower Body Dressing: Set up;Maximal assistance   Toilet Transfer: Total assistance                   Vision Baseline Vision/History: No visual deficits       Perception  Praxis      Pertinent Vitals/Pain Pain Assessment: No/denies pain     Hand Dominance Right   Extremity/Trunk Assessment Upper Extremity Assessment Upper Extremity Assessment: Generalized weakness           Communication Communication Communication: HOH   Cognition Arousal/Alertness: Awake/alert Behavior During Therapy: WFL for tasks assessed/performed Overall Cognitive Status: Within Functional Limits for tasks assessed                                     General Comments        Exercises     Shoulder Instructions      Home Living Family/patient expects to be discharged to:: Private residence Living Arrangements: Spouse/significant other Available Help at Discharge: Family;Available 24 hours/day Type of Home: House Home Access: Stairs to enter [employer] of Steps: 4 Entrance Stairs-Rails: Can reach both Home Layout: One level     Bathroom Shower/Tub: Biomedical scientist: Standard     Home Equipment: Toilet riser;Cane - single point;Walker - 2 wheels;Hospital bed;Wheelchair - manual;Bedside commode          Prior Functioning/Environment Level of Independence: Needs assistance  Gait / Transfers Assistance Needed: Has required assistance +1 with bed mobility due to pain at suprapubic region. Requires assistance +1 with STS transfer and ambulation primarily for balance. ADL's / Homemaking Assistance Needed: Patient requires help with morning care bathing, and dressing, meal preparation,a nd medication managemetnfrom his wife.   Comments: Per wife: she lives with pt, and children live very close by and also assist frequently; someone is there 24/7        OT Problem List: Decreased strength;Decreased activity tolerance;Decreased range of motion;Decreased knowledge of use of DME or AE;Impaired balance (sitting and/or standing)      OT Treatment/Interventions: Self-care/ADL training;Therapeutic exercise;Neuromuscular education;Patient/family education;Energy conservation;DME and/or AE instruction;Therapeutic activities    OT Goals(Current goals can be found in the care plan section) Acute Rehab OT Goals Patient Stated Goal: Pt. be able to do more by himself OT Goal Formulation: With patient Potential to Achieve Goals: Good  OT Frequency: Min 2X/week   Barriers to D/C:            Co-evaluation              AM-PAC OT "6 Clicks" Daily Activity     Outcome Measure Help from another person eating meals?:  A Little Help from another person taking care of personal grooming?: A Little Help from another person toileting, which includes using toliet, bedpan, or urinal?: A Lot   Help from another person to put on and taking off regular upper body clothing?: A Lot Help from another person to put on and taking off regular lower body clothing?: A Lot 6 Click Score: 12   End of Session    Activity Tolerance: Patient tolerated treatment well Patient left: in bed;with call bell/phone within reach;with bed alarm set  OT Visit Diagnosis: Muscle weakness (generalized) (M62.81)                Time: 3570-1779 OT Time Calculation (min): 30 min Charges:  OT General Charges $OT Visit: 1 Visit OT Treatments $Self Care/Home Management : 23-37 mins  Harrel Carina, MS, OTR/L  Harrel Carina 10/17/2019, 12:24 PM

## 2019-10-17 NOTE — Progress Notes (Signed)
ID Pt stable No new complaints  Patient Vitals for the past 24 hrs:  BP Temp Temp src Pulse Resp SpO2  10/17/19 1237 123/60 (!) 97.5 F (36.4 C) Oral 95 16 97 %  10/17/19 0427 124/77 98 F (36.7 C) Oral (!) 119 20 97 %  10/16/19 1931 (!) 116/53 97.9 F (36.6 C) Oral (!) 105 20 96 %    Awake and alert Chest b/l air entry HSs 1s2 Abd - colostomy Firm mass lower abdomen -opening to the skin which is covered with ostomy bag      CBC Latest Ref Rng & Units 10/17/2019 10/16/2019 10/15/2019  WBC 4.0 - 10.5 K/uL 64.4(HH) 71.0(HH) 73.9(HH)  Hemoglobin 13.0 - 17.0 g/dL 9.9(L) 9.9(L) 10.3(L)  Hematocrit 39.0 - 52.0 % 30.5(L) 31.2(L) 32.5(L)  Platelets 150 - 400 K/uL 383 390 415(H)    CMP Latest Ref Rng & Units 10/17/2019 10/16/2019 10/15/2019  Glucose 70 - 99 mg/dL 86 91 81  BUN 8 - 23 mg/dL 11 11 9   Creatinine 0.61 - 1.24 mg/dL 0.72 0.98 1.06  Sodium 135 - 145 mmol/L 139 142 137  Potassium 3.5 - 5.1 mmol/L 3.5 4.6 2.9(L)  Chloride 98 - 111 mmol/L 108 110 106  CO2 22 - 32 mmol/L 22 23 20(L)  Calcium 8.9 - 10.3 mg/dL 8.2(L) 8.5(L) 8.0(L)  Total Protein 6.5 - 8.1 g/dL - - -  Total Bilirubin 0.3 - 1.2 mg/dL - - -  Alkaline Phos 38 - 126 U/L - - -  AST 15 - 41 U/L - - -  ALT 0 - 44 U/L - - -    Blood culture:NG  Impression/recommendation  Bladder malignancy extending to the skin with a fistulous tract and fungating tumor- has an ostomy bag Received chemo, now getting radiation No role for antibiotics unless systemically ill  Colonic obstruction from bladder tumor- s/p loop colostomy   Leukemoid reaction due to the tumor burden- No role for antibiotics -no response to triple antibiotics given for 10 days- stopped yesterday- watch off antibiotics  Poor prognosis  ID will sign off- call if needed  Discussed the management with the patient and wife

## 2019-10-17 NOTE — Consult Note (Signed)
[place] for Electrolyte Monitoring and Replacement   Recent Labs: Potassium (mmol/L)  Date Value  10/17/2019 3.5   Magnesium (mg/dL)  Date Value  10/17/2019 1.8   Calcium (mg/dL)  Date Value  10/17/2019 8.2 (L)   Albumin (g/dL)  Date Value  10/06/2019 2.4 (L)   Phosphorus (mg/dL)  Date Value  10/10/2019 2.5   Sodium (mmol/L)  Date Value  10/17/2019 139   Corrected Ca: 9.5 mg/dL  Assessment: 84 y.o.malewith medical history significant forlocally-metastatic bladder cancer on chemotherapy, urinary tract obstruction with b/l nephrostomy tubes in place, recurrent UTI, htn, colostomy in place, provoked dvt s/p anticoagulation, who presents with an abscess with sepsis - secondary to locally metastatic bladder cancerconcerning forerosionthrough the anterior abdominal wall.  Goal of Therapy:  Electrolytes WNL  Plan:   K dropped from 4.6 yesterday to 3.5 today. To prevent further decline, will supplement with KCl 2mEq x 1 dose.  BMP/Magnesium level in AM.   Pearla Dubonnet ,PharmD Clinical Pharmacist 10/17/2019 7:48 AM

## 2019-10-17 NOTE — Progress Notes (Signed)
Notified Dr. Mal Misty that the patients heart rate was fluctuating 60's-120's. Dr. Mal Misty wants to continue to monitor.

## 2019-10-17 NOTE — Progress Notes (Signed)
Physical Therapy Treatment Patient Details Name: Vincent Poole MRN: [id number] DOB: 11/19/1934 Today's Date: 10/17/2019    History of Present Illness Pt. is an 84 y.o. male with medical history significant for locally-metastatic bladder cancer on chemotherapy, urinary tract obstruction with b/l nephrostomy tubes in place, recurrent UTI, htn, colostomy in place. Pt with PMH of GERD, anxiety, arthritis, history of atrial flutter, R femur fx, HTN, prostate cancer, umbilical hernia, wound eschar of L foot, multiple bladder surgeries/procedures. Pt presents to ED with a red "lump" on his lower abdomen in the suprapubic region for several months, has open/draining; noted at recent hospitalization thought to be 2/2 metastatic bladder cancer. Pt given broad spectrum abx and CT abd ordered.    PT Comments    Pt presented with deficits in strength, transfers, mobility, gait, balance, and activity tolerance.  Pt motivated to participate with PT this session.  Pt participate with below therex with occasional therapeutic rest breaks but no adverse symptoms other than general fatigue noted.  Upon sitting pt was able to participate with some seated therex but began to c/o of dizziness/lightheadedness and requested to return to supine.  Pt's SpO2 was WNL with HR in the 120's, nursing notified.  Pt will benefit from PT services in a SNF setting upon discharge to safely address above deficits for decreased caregiver assistance and eventual return to PLOF.     Follow Up Recommendations  Home health PT;Supervision for mobility/OOB     Equipment Recommendations  None recommended by PT    Recommendations for Other Services       Precautions / Restrictions Precautions Precautions: Fall Precaution Comments: bilateral nephrostomy tubes, colostomy; abdominal drain Restrictions Weight Bearing Restrictions: No    Mobility  Bed Mobility Overal bed mobility: Needs Assistance Bed Mobility: Supine to Sit;Sit  to Supine     Supine to sit: Mod assist Sit to supine: Mod assist   General bed mobility comments: Pt required physical assistance for both trunk and BLE control  Transfers                 General transfer comment: Unable, pt lightheaded/dizzy in sitting and unable to attempt to stand  Ambulation/Gait                 Stairs             Wheelchair Mobility    Modified Rankin (Stroke Patients Only)       Balance Overall balance assessment: Needs assistance Sitting-balance support: Feet supported Sitting balance-Leahy Scale: Fair         Standing balance comment: Deferred standing activities today for safety.                            Cognition Arousal/Alertness: Awake/alert Behavior During Therapy: WFL for tasks assessed/performed Overall Cognitive Status: Within Functional Limits for tasks assessed                                        Exercises Total Joint Exercises Ankle Circles/Pumps: Strengthening;Both;10 reps;15 reps Quad Sets: Strengthening;Both;10 reps;15 reps Gluteal Sets: Strengthening;Both;10 reps Heel Slides: AROM;Both;10 reps Hip ABduction/ADduction: AROM;AAROM;Both;10 reps;15 reps Straight Leg Raises: AROM;AAROM;Both;10 reps;15 reps Long Arc Quad: Strengthening;Both;10 reps Knee Flexion: Strengthening;Both;10 reps    General Comments        Pertinent Vitals/Pain Pain Assessment: No/denies pain    Home  Living Family/patient expects to be discharged to:: Private residence Living Arrangements: Spouse/significant other Available Help at Discharge: Family;Available 24 hours/day Type of Home: House Home Access: Stairs to enter Entrance Stairs-Rails: Can reach both Home Layout: One level Home Equipment: Toilet riser;Cane - single point;Walker - 2 wheels;Hospital bed;Wheelchair - manual;Bedside commode      Prior Function Level of Independence: Needs assistance  Gait / Transfers Assistance  Needed: Has required assistance +1 with bed mobility due to pain at suprapubic region. Requires assistance +1 with STS transfer and ambulation primarily for balance. ADL's / Homemaking Assistance Needed: Patient requires help with morning care bathing, and dressing, meal preparation,a nd medication managemetnfrom his wife. Comments: Per wife: she lives with pt, and children live very close by and also assist frequently; someone is there 24/7   PT Goals (current goals can now be found in the care plan section) Acute Rehab PT Goals Patient Stated Goal: Pt. be able to do more by himself Progress towards PT goals: Not progressing toward goals - comment(Pt limited by orthostatic-like symptoms in sitting)    Frequency    Min 2X/week      PT Plan Current plan remains appropriate    Co-evaluation              AM-PAC PT "6 Clicks" Mobility   Outcome Measure  Help needed turning from your back to your side while in a flat bed without using bedrails?: A Little Help needed moving from lying on your back to sitting on the side of a flat bed without using bedrails?: A Lot Help needed moving to and from a bed to a chair (including a wheelchair)?: A Lot Help needed standing up from a chair using your arms (e.g., wheelchair or bedside chair)?: A Lot Help needed to walk in hospital room?: A Lot Help needed climbing 3-5 steps with a railing? : Total 6 Click Score: 12    End of Session   Activity Tolerance: Patient limited by fatigue;Other (comment)(Pt limited by lightheadedness/dizziness in sitting) Patient left: in bed;with bed alarm set;with call bell/phone within reach;with family/visitor present Nurse Communication: Mobility status;Other (comment)(Pt response to sitting at EOB) PT Visit Diagnosis: Other abnormalities of gait and mobility (R26.89);Muscle weakness (generalized) (M62.81);Unsteadiness on feet (R26.81)     Time: 9024-0973 PT Time Calculation (min) (ACUTE ONLY): 23  min  Charges:  $Therapeutic Exercise: 8-22 mins $Therapeutic Activity: 8-22 mins                     D. Scott Kathyleen Radice PT, DPT 10/17/19, 12:58 PM

## 2019-10-18 ENCOUNTER — Ambulatory Visit
Admission: RE | Admit: 2019-10-18 | Discharge: 2019-10-18 | Disposition: A | Discharge status: Home / Self Care | Payer: Medicare Other | Source: Ambulatory Visit | Attending: Radiation Oncology | Admitting: Radiation Oncology

## 2019-10-18 ENCOUNTER — Inpatient Hospital Stay: Payer: Medicare Other

## 2019-10-18 ENCOUNTER — Inpatient Hospital Stay: Payer: Medicare Other | Admitting: Oncology

## 2019-10-18 DIAGNOSIS — B999 Unspecified infectious disease: Secondary | ICD-10-CM

## 2019-10-18 LAB — CBC WITH DIFFERENTIAL/PLATELET
Abs Immature Granulocytes: 2.05 10*3/uL — ABNORMAL HIGH (ref 0.00–0.07)
Basophils Absolute: 0.1 10*3/uL (ref 0.0–0.1)
Basophils Relative: 0 %
Eosinophils Absolute: 0.5 10*3/uL (ref 0.0–0.5)
Eosinophils Relative: 1 %
HCT: 31.2 % — ABNORMAL LOW (ref 39.0–52.0)
Hemoglobin: 10.1 g/dL — ABNORMAL LOW (ref 13.0–17.0)
Immature Granulocytes: 3 %
Lymphocytes Relative: 7 %
Lymphs Abs: 4.4 10*3/uL — ABNORMAL HIGH (ref 0.7–4.0)
MCH: 28.8 pg (ref 26.0–34.0)
MCHC: 32.4 g/dL (ref 30.0–36.0)
MCV: 88.9 fL (ref 80.0–100.0)
Monocytes Absolute: 2.9 10*3/uL — ABNORMAL HIGH (ref 0.1–1.0)
Monocytes Relative: 4 %
Neutro Abs: 56.9 10*3/uL — ABNORMAL HIGH (ref 1.7–7.7)
Neutrophils Relative %: 85 %
Platelets: 394 10*3/uL (ref 150–400)
RBC: 3.51 MIL/uL — ABNORMAL LOW (ref 4.22–5.81)
RDW: 19.9 % — ABNORMAL HIGH (ref 11.5–15.5)
WBC: 66.9 10*3/uL (ref 4.0–10.5)
nRBC: 0 % (ref 0.0–0.2)

## 2019-10-18 LAB — MAGNESIUM: Magnesium: 1.8 mg/dL (ref 1.7–2.4)

## 2019-10-18 LAB — BASIC METABOLIC PANEL
Anion gap: 9 (ref 5–15)
BUN: 14 mg/dL (ref 8–23)
CO2: 25 mmol/L (ref 22–32)
Calcium: 8.3 mg/dL — ABNORMAL LOW (ref 8.9–10.3)
Chloride: 106 mmol/L (ref 98–111)
Creatinine, Ser: 0.79 mg/dL (ref 0.61–1.24)
GFR calc Af Amer: >60 mL/min (ref >60)
GFR calc non Af Amer: >60 mL/min (ref >60)
Glucose, Bld: 110 mg/dL — ABNORMAL HIGH (ref 70–99)
Potassium: 3.7 mmol/L (ref 3.5–5.1)
Sodium: 140 mmol/L (ref 135–145)

## 2019-10-18 NOTE — TOC Progression Note (Addendum)
Transition of Care [facility]) - Progression Note    Patient Details  Name: Vincent Poole MRN: [id number] Date of Birth: Aug 22, 1935  Transition of [facility] CM/SW Contact  Shade Flood, LCSW Phone Number: 10/18/2019, 12:13 PM  Clinical Narrative:     TOC following with chart review. Plan of care continues. Pt has had Palliative consult again and pt and his wife want to continue full care. Originally pt's wife had indicated that she didn't think pt would need HH at dc. PT recommending HH PT. TOC will follow up with pt and wife when dc timeframe identified to discuss possible HH referral.   1600: Received update from PT that recommendation is changing for SNF at dc rather than HH. Pt's wife told PT she would think about it but that [facility] remains the only facility they would consider. Will send referral to [facility]. They are waiting clearance to take new admissions and should know by Monday.  TOC will follow.  Expected Discharge Plan: Home/Self Care Barriers to Discharge: Continued Medical Work up  Expected Discharge Plan and Services Expected Discharge Plan: Home/Self Care       Living arrangements for the past 2 months: Single Family Home                                       Social Determinants of Health (SDOH) Interventions    Readmission Risk Interventions Readmission Risk Prevention Plan 10/10/2019 09/27/2019 04/28/2019  Transportation Screening Complete Complete Complete  PCP or Specialist Appt within 3-5 Days - - Not Complete  [place] or [place] - - Complete  Social Work Consult for [place] Planning/Counseling - - Patient refused  Palliative Care Screening - - -  Medication Review [employer] photographer) Complete Complete Complete  PCP or Specialist appointment within 3-5 days of discharge - Complete -  [place] or [place] Patient refused (No Data) -  SW Recovery Care/Counseling Consult Complete Complete -  Palliative Care Screening - Not Applicable -   [place] - Not Applicable -  Some recent data might be hidden

## 2019-10-18 NOTE — Consult Note (Signed)
Hematology/Oncology Consult note [facility] Telephone:([phone] [phone]  Patient Care Team: [name], MD as PCP - General (Family Medicine)   Name of the patient: Vincent Poole  [id number]  08/05/1935    Reason for consult: Metastatic urothelial carcinoma   Requesting physician: Jennye Boroughs  Date of visit: 10/18/2019    History of presenting illness-patient is a 84 year old male with metastatic urothelial carcinoma diagnosed in 2018.  He has received carboplatin and gemcitabine first-line followed by tecentriq second line and currently on third line Padcev.  He has had multiple complications along the way including bilateral hydronephrosis for which she has bilateral nephrostomy tubes in place.  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  Most recently he was found to have local progression of his tumor in the lower abdominal wall through his rectus muscle.  He was brought to the hospital due to evidence of skin breakdown from the site.  He currently has a bag over the site and also undergoing palliative radiation treatment.  At baseline patient lives with his wife.  He is extremely hard of hearing.  Currently he denies any pain  ECOG PS- 3  Pain scale- 0   Review of systems- Review of Systems  Constitutional: Positive for malaise/fatigue. Negative for chills, fever and weight loss.  HENT: Negative for congestion, ear discharge and nosebleeds.   Eyes: Negative for blurred vision.  Respiratory: Negative for cough, hemoptysis, sputum production, shortness of breath and wheezing.   Cardiovascular: Negative for chest pain, palpitations, orthopnea and claudication.  Gastrointestinal: Negative for abdominal pain, blood in stool, constipation, diarrhea, heartburn, melena, nausea and vomiting.  Genitourinary: Negative for dysuria, flank pain, frequency, hematuria and urgency.    Musculoskeletal: Negative for back pain, joint pain and myalgias.  Skin: Negative for rash.  Neurological: Negative for dizziness, tingling, focal weakness, seizures, weakness and headaches.  Endo/Heme/Allergies: Does not bruise/bleed easily.  Psychiatric/Behavioral: Negative for depression and suicidal ideas. The patient does not have insomnia.     No Known Allergies  Patient Active Problem List   Diagnosis Date Noted  . Intra-abdominal abscess ([place])   . Intra-abdominal infection 10/05/2019  . S/P partial resection of colon 07/17/2019  . Protein-calorie malnutrition, severe 06/11/2019  . Abdominal pain, generalized   . Palliative care by specialist   . Urothelial cancer ([place])   . Weakness   . Large bowel obstruction ([place]) 06/06/2019  . Hydronephrosis, right 04/08/2019  . Pressure injury of skin 03/23/2019  . SIRS (systemic inflammatory response syndrome) ([place]) 03/22/2019  . Leukocytosis 02/21/2019  . Attention to nephrostomy ([place]) 11/22/2018  . Complicated UTI (urinary tract infection) 11/14/2018  . Palliative care encounter   . Iron deficiency anemia secondary to inadequate dietary iron intake 06/27/2018  . B12 deficiency 06/27/2018  . History of fracture of right hip 06/26/2018  . History of normocytic normochromic anemia 06/26/2018  . Personal history of bladder cancer 06/26/2018  . Hip fracture ([place]) 05/01/2018  . Malignant neoplasm of urinary bladder ([place]) 01/12/2018  . Goals of care, counseling/discussion 01/12/2018  . History of recurrent UTIs 08/03/2017  . Pyelonephritis 05/31/2017  . UTI (urinary tract infection) 03/04/2017  . Urinary obstruction   . Essential hypertension 08/30/2016  . History of shingles 08/30/2016  . Prostate cancer ([place]) 08/30/2016  . Urinary retention 08/30/2016  . Medicare annual wellness visit, initial 08/01/2016  . Medicare annual wellness visit, subsequent 08/01/2016  . Sepsis ([place]) 07/11/2016  . Borderline  diabetes mellitus  01/26/2016  . Vaccine counseling 01/26/2015  . Lumbar radiculitis 06/24/2014  . OA (osteoarthritis) of hip 06/24/2014  . Malignant neoplasm of prostate ([place]) 01/27/2011     Past Medical History:  Diagnosis Date  . Anxiety   . Arthritis   . Bladder cancer ([place])   . Dysrhythmia 07/2018   history of atrial flutter that worsens with anxiety  . Femur fracture, right ([place]) 05/01/2018  . GERD (gastroesophageal reflux disease)   . History of recent blood transfusion 05/2018  . Hypertension   . Iron deficiency anemia 09/14/2018  . Prostate cancer ([place]) 07/2018   cancer growing in prostate but not prostate cancer, it is from the bladder  . Umbilical hernia 18/5631  . Urinary retention 2019   foley catheter place 11/2017  . UTI (urinary tract infection) 2019   frequent UTI's over last year  . Wound eschar of foot 07/2018   left heal getting wrapped and requiring antibiotic cream. cracks open with weight bearing.     Past Surgical History:  Procedure Laterality Date  . CARPAL TUNNEL RELEASE Right   . CHOLECYSTECTOMY  2004  . COLOSTOMY N/A 06/08/2019   Procedure: COLOSTOMY;  Surgeon: Herbert Pun, MD;  Location: [facility];  Service: General;  Laterality: N/A;  . CYSTOGRAM  07/18/2018   Procedure: CYSTOGRAM;  Surgeon: [name], MD;  Location: [facility];  Service: Urology;;  . Consuela Mimes W/ RETROGRADES Bilateral 07/18/2018   Procedure: CYSTOSCOPY WITH RETROGRADE PYELOGRAM;  Surgeon: [name], MD;  Location: [facility];  Service: Urology;  Laterality: Bilateral;  . CYSTOSCOPY W/ URETERAL STENT PLACEMENT Bilateral 12/27/2017   Procedure: CYSTOSCOPY WITH RETROGRADE PYELOGRAM/URETERAL STENT PLACEMENT;  Surgeon: [name], MD;  Location: [facility];  Service: Urology;  Laterality: Bilateral;  . CYSTOSCOPY W/ URETERAL STENT PLACEMENT Bilateral 07/18/2018   Procedure: CYSTOSCOPY WITH STENT REPLACEMENT (exchange);  Surgeon: [name], MD;  Location: [facility];  Service:  Urology;  Laterality: Bilateral;  . CYSTOSCOPY WITH STENT PLACEMENT Bilateral 11/12/2018   Procedure: [place] WITH STENT Exchange;  Surgeon: [name], MD;  Location: [facility];  Service: Urology;  Laterality: Bilateral;  . INTRAMEDULLARY (IM) NAIL INTERTROCHANTERIC Right 05/02/2018   Procedure: INTRAMEDULLARY (IM) NAIL INTERTROCHANTRIC;  Surgeon: [name], MD;  Location: [facility];  Service: Orthopedics;  Laterality: Right;  . IR NEPHROSTOMY EXCHANGE LEFT  03/25/2019  . IR NEPHROSTOMY EXCHANGE LEFT  04/19/2019  . IR NEPHROSTOMY EXCHANGE LEFT  05/31/2019  . IR NEPHROSTOMY EXCHANGE LEFT  07/19/2019  . IR NEPHROSTOMY EXCHANGE LEFT  09/13/2019  . IR NEPHROSTOMY EXCHANGE RIGHT  03/25/2019  . IR NEPHROSTOMY EXCHANGE RIGHT  04/19/2019  . IR NEPHROSTOMY EXCHANGE RIGHT  05/31/2019  . IR NEPHROSTOMY EXCHANGE RIGHT  07/19/2019  . IR NEPHROSTOMY EXCHANGE RIGHT  09/13/2019  . IR NEPHROSTOMY PLACEMENT LEFT  02/23/2019  . IR NEPHROSTOMY PLACEMENT RIGHT  02/23/2019  . LAPAROTOMY N/A 06/08/2019   Procedure: EXPLORATORY LAPAROTOMY;  Surgeon: Herbert Pun, MD;  Location: [facility];  Service: General;  Laterality: N/A;  . LEG TENDON SURGERY Right 1958  . PORTA CATH INSERTION N/A 01/22/2018   Procedure: PORTA CATH INSERTION;  Surgeon: [name], MD;  Location: [facility];  Service: Cardiovascular;  Laterality: N/A;  . TRANSURETHRAL RESECTION OF BLADDER TUMOR N/A 12/27/2017   Procedure: TRANSURETHRAL RESECTION OF BLADDER TUMOR (TURBT);  Surgeon: [name], MD;  Location: [facility];  Service: Urology;  Laterality: N/A;  . TRANSURETHRAL RESECTION OF BLADDER TUMOR N/A 01/15/2018   Procedure:  TRANSURETHRAL RESECTION OF BLADDER TUMOR (TURBT);  Surgeon: [name], MD;  Location: [facility];  Service: Urology;  Laterality: N/A;  Need 2 hrs for this case please  . TRANSURETHRAL RESECTION OF BLADDER TUMOR N/A 07/18/2018   Procedure: TRANSURETHRAL RESECTION OF BLADDER TUMOR (TURBT);  Surgeon:  [name], MD;  Location: [facility];  Service: Urology;  Laterality: N/A;    Social History   Socioeconomic History  . Marital status: Married    Spouse name: ruth  . Number of children: Not on file  . Years of education: Not on file  . Highest education level: Not on file  Occupational History  . Occupation: retired    Comment: Therapist, [employer]  Tobacco Use  . Smoking status: Never Smoker  . Smokeless tobacco: Never Used  Substance and Sexual Activity  . Alcohol use: No    Alcohol/week: 0.0 standard drinks  . Drug use: No  . Sexual activity: Not Currently  Other Topics Concern  . Not on file  Social History Narrative  . Not on file   Social Determinants of Health   Financial Resource Strain: Unknown  . Difficulty of Paying Living Expenses: Patient refused  Food Insecurity: Unknown  . Worried About Charity fundraiser in the Last Year: Patient refused  . Ran Out of Food in the Last Year: Patient refused  Transportation Needs: Unknown  . Lack of Transportation (Medical): Patient refused  . Lack of Transportation (Non-Medical): Patient refused  Physical Activity: Unknown  . Days of Exercise per Week: Patient refused  . Minutes of Exercise per Session: Patient refused  Stress: Unknown  . Feeling of Stress : Patient refused  Social Connections: Unknown  . Frequency of Communication with Friends and Family: Patient refused  . Frequency of Social Gatherings with Friends and Family: Patient refused  . Attends Religious Services: Patient refused  . Active Member of Clubs or Organizations: Patient refused  . Attends Archivist Meetings: Patient refused  . Marital Status: Patient refused  Intimate Partner Violence: Unknown  . Fear of Current or Ex-Partner: Patient refused  . Emotionally Abused: Patient refused  . Physically Abused: Patient refused  . Sexually Abused: Patient refused     Family History  Problem Relation Age of Onset  . Cancer Mother   .  Chronic Renal Failure Mother   . Heart disease Father      Current Facility-Administered Medications:  .  0.9 %  sodium chloride infusion, , Intravenous, PRN, Si Raider, Ailene Rud, MD, Stopped at 10/14/19 1547 .  Chlorhexidine Gluconate Cloth 2 % PADS 6 each, 6 each, Topical, Daily, Jennye Boroughs, MD, 6 each at 10/17/19 1000 .  enoxaparin (LOVENOX) injection 40 mg, 40 mg, Subcutaneous, Q24H, Jennye Boroughs, MD, 40 mg at 10/17/19 2348 .  feeding supplement (ENSURE ENLIVE) (ENSURE ENLIVE) liquid 237 mL, 237 mL, Oral, TID BM, Jennye Boroughs, MD, 237 mL at 10/18/19 1005 .  mirabegron ER (MYRBETRIQ) tablet 50 mg, 50 mg, Oral, Daily, Wouk, Ailene Rud, MD, 50 mg at 10/18/19 1005 .  multivitamin with minerals tablet 1 tablet, 1 tablet, Oral, Daily, Jennye Boroughs, MD, 1 tablet at 10/18/19 1005 .  oxyCODONE (Oxy IR/ROXICODONE) immediate release tablet 5 mg, 5 mg, Oral, Q4H PRN, Wouk, Ailene Rud, MD, 5 mg at 10/14/19 2221 .  sodium chloride flush (NS) 0.9 % injection 5 mL, 5 mL, Intravenous, Q8H, Sreenath, Sudheer B, MD, 5 mL at 10/18/19 0538   Physical exam:  Vitals:   10/17/19 1237 10/17/19  2118 10/18/19 0526 10/18/19 1225  BP: 123/60 (!) 115/53 106/67 120/67  Pulse: 95 (!) 113 (!) 105 96  Resp: 16 18 18    Temp: (!) 97.5 F (36.4 C) 98.1 F (36.7 C) 98.6 F (37 C) 98.1 F (36.7 C)  TempSrc: Oral Oral Oral   SpO2: 97% 99% 95% 98%  Weight:      Height:       Physical Exam Constitutional:      Comments: Frail elderly gentleman resting in bed  HENT:     Head: Normocephalic and atraumatic.  Eyes:     Pupils: Pupils are equal, round, and reactive to light.  Cardiovascular:     Rate and Rhythm: Normal rate and regular rhythm.     Heart sounds: Normal heart sounds.  Pulmonary:     Effort: Pulmonary effort is normal.     Breath sounds: Normal breath sounds.  Abdominal:     General: Bowel sounds are normal.     Palpations: Abdomen is soft.     Comments: Bilateral nephrostomy tubes in  place.  Diverting colostomy in place with adequate stool output.  Also has a bag over lower midline abdominal wall skin breakdown draining serous discharge.  Musculoskeletal:     Cervical back: Normal range of motion.  Skin:    General: Skin is warm and dry.  Neurological:     Mental Status: He is alert and oriented to person, place, and time.        CMP Latest Ref Rng & Units 10/18/2019  Glucose 70 - 99 mg/dL 110(H)  BUN 8 - 23 mg/dL 14  Creatinine 0.61 - 1.24 mg/dL 0.79  Sodium 135 - 145 mmol/L 140  Potassium 3.5 - 5.1 mmol/L 3.7  Chloride 98 - 111 mmol/L 106  CO2 22 - 32 mmol/L 25  Calcium 8.9 - 10.3 mg/dL 8.3(L)  Total Protein 6.5 - 8.1 g/dL -  Total Bilirubin 0.3 - 1.2 mg/dL -  Alkaline Phos 38 - 126 U/L -  AST 15 - 41 U/L -  ALT 0 - 44 U/L -   CBC Latest Ref Rng & Units 10/18/2019  WBC 4.0 - 10.5 K/uL 66.9(HH)  Hemoglobin 13.0 - 17.0 g/dL 10.1(L)  Hematocrit 39.0 - 52.0 % 31.2(L)  Platelets 150 - 400 K/uL 394    @IMAGES @  EEG  Result Date: 09/26/2019 Alexis Goodell, MD     09/26/2019  5:56 PM ELECTROENCEPHALOGRAM REPORT Patient: WITT PLITT       Room #: 240A-AA EEG No. ID: [id number] Age: 84 y.o.        Sex: male Referring Physician: Posey Pronto Report Date:  09/26/2019       Interpreting Physician: Alexis Goodell History: KEISHAUN HAZEL is an 84 y.o. male with seizure like activity Medications: Myrbetriq, Cefepime Conditions of Recording:  This is a 21 channel routine scalp EEG performed with bipolar and monopolar montages arranged in accordance to the international 10/20 system of electrode placement. One channel was dedicated to EKG recording. The patient is in the awake state. Description:  Artifact is prominent during the recording often obscuring the background rhythm. When able to be visualized the background is slow and poorly organized.   It consists of a low voltage, mixture of theta activity and polymorphic delta activity that is diffusely distributed and  continuous.  Theta activity is most predominant.  No epileptiform activity is noted.  The patient does not drowse or sleep. Hyperventilation was not performed.  Intermittent photic stimulation was performed  but failed to illicit any change in the tracing. IMPRESSION: This is an abnormal EEG secondary to general background slowing.  This finding may be seen with a diffuse disturbance that is etiologically nonspecific, but may include a metabolic encephalopathy, among other possibilities.  No epileptiform activity is noted.  Alexis Goodell, MD Neurology [phone] 09/26/2019, 5:51 PM   CT Head Wo Contrast  Result Date: 09/25/2019 CLINICAL DATA:  Seizure EXAM: CT HEAD WITHOUT CONTRAST TECHNIQUE: Contiguous axial images were obtained from the base of the skull through the vertex without intravenous contrast. COMPARISON:  None. FINDINGS: Brain: There is atrophy and chronic small vessel disease changes. No acute intracranial abnormality. Specifically, no hemorrhage, hydrocephalus, mass lesion, acute infarction, or significant intracranial injury. Vascular: No hyperdense vessel or unexpected calcification. Skull: No acute calvarial abnormality. Sinuses/Orbits: Visualized paranasal sinuses and mastoids clear. Orbital soft tissues unremarkable. Other: None IMPRESSION: Atrophy, chronic microvascular disease. No acute intracranial abnormality. Electronically Signed   By: Rolm Baptise M.D.   On: 09/25/2019 20:42   CT ABDOMEN PELVIS W CONTRAST  Result Date: 10/13/2019 CLINICAL DATA:  Abdominal pain, persistent leukocytosis, suspected abscess, re-evaluate anterior abdominal abscess; history of prostate cancer, locally invasive bladder cancer, hypertension, GERD EXAM: CT ABDOMEN AND PELVIS WITH CONTRAST TECHNIQUE: Multidetector CT imaging of the abdomen and pelvis was performed using the standard protocol following bolus administration of intravenous contrast. Sagittal and coronal MPR images reconstructed from axial  data set. CONTRAST:  125mL OMNIPAQUE IOHEXOL 300 MG/ML SOLN IV. No oral contrast. COMPARISON:  10/05/2019 FINDINGS: Lower chest: Bibasilar small pleural effusions and compressive atelectasis of the lower lobes. Hepatobiliary: Gallbladder surgically absent.  Liver unremarkable. Pancreas: Atrophic without mass Spleen: Normal appearance Adrenals/Urinary Tract: Adrenal glands normal appearance. BILATERAL nephrostomy tubes. Mild BILATERAL ureteral dilatation greater on LEFT. Small BILATERAL renal cysts. Large enhancing mass arising from the bladder consistent with bladder neoplasm with invasion of the pre vesicle space and anterior abdominal wall, mass overall measuring 14.3 x 8.2 x 10.0 cm. Decreased air within the subcutaneous loculation, with this collection now clearly appearing to communicate with the bladder lumen rather than a separate loculated collection. Stomach/Bowel: Loop colostomy mid abdomen. Slightly increased stool in RIGHT colon and within rectum. Small bowel loops unremarkable. Stomach normal appearance. Normal appendix. Vascular/Lymphatic: Atherosclerotic calcifications aorta, iliac arteries, femoral arteries. Aorta normal caliber. No adenopathy. Reproductive: Minimal prostatic enlargement. Several clips within the prostate gland with suspect prior TURP. Other: No free air or free fluid.  No hernia. Musculoskeletal: Bones demineralized. Advanced degenerative changes of both hips greater on RIGHT with prior proximal RIGHT femoral ORIF. IMPRESSION: Slight increase in size of bladder neoplasm since previous exam with evidence of invasion of the anterior abdominal wall. The previously identified abscess collection anterior to the bladder appears to communicate with the bladder lumen and demonstrates less air on the current study, otherwise unchanged. BILATERAL distal ureteral obstruction with BILATERAL nephrostomy tubes. Small BILATERAL pleural effusions and bibasilar atelectasis. Electronically Signed    By: Lavonia Dana M.D.   On: 10/13/2019 15:47   CT ABDOMEN PELVIS W CONTRAST  Result Date: 10/05/2019 CLINICAL DATA:  84 year old male with history of lower abdominal pain from an abscess. EXAM: CT ABDOMEN AND PELVIS WITH CONTRAST TECHNIQUE: Multidetector CT imaging of the abdomen and pelvis was performed using the standard protocol following bolus administration of intravenous contrast. CONTRAST:  11mL OMNIPAQUE IOHEXOL 300 MG/ML  SOLN COMPARISON:  CT the abdomen and pelvis 09/25/2019. FINDINGS: Lower chest: Small right pleural effusion lying dependently. Areas of subsegmental  atelectasis in the lower lobes of the lungs bilaterally. Atherosclerotic calcifications in the descending thoracic aorta as well as the left anterior descending, left circumflex and right coronary arteries. Hepatobiliary: No suspicious cystic or solid hepatic lesions. No intra or extrahepatic biliary ductal dilatation. Status post cholecystectomy. Pancreas: No pancreatic mass. No pancreatic ductal dilatation. No pancreatic or peripancreatic fluid collections or inflammatory changes. Spleen: Unremarkable. Adrenals/Urinary Tract: Percutaneous nephrostomy tubes are noted with tips reformed in the renal collecting systems bilaterally. 2.8 cm low-attenuation lesion in the anterior aspect of the interpolar region of the right kidney, compatible with a simple cyst. Subcentimeter low-attenuation lesion in the anterior aspect of the lower pole the left kidney, too small to characterize, but statistically likely to represent a tiny cyst. No hydroureteronephrosis. Urinary bladder is enlarged measuring approximately 8.6 x 14.5 x 8.8 cm and grossly distorted with extensive multifocal mural thickening and widespread areas of internal soft tissue thickening and enhancing soft tissue, with a combination of fluid and gas within the bladder, concerning for progressively enlarging bladder neoplasm. This appears to extend through the lower anterior abdominal  wall where there is a small gas and fluid collection in the subcutaneous fat immediately superficial to the anterior abdominal wall musculature measuring 8.0 x 1.7 x 5.0 cm (axial image 68 of series 2 and sagittal image 64 of series 6). Bilateral adrenal glands are normal in appearance. Stomach/Bowel: Normal appearance of the stomach. No pathologic dilatation of small bowel or colon. A few scattered colonic diverticulae are noted, without surrounding inflammatory changes to suggest an acute diverticulitis at this time. Mid transverse colon loop colostomy. Normal appendix. Vascular/Lymphatic: Aortic atherosclerosis, without evidence of aneurysm or dissection in the abdominal or pelvic vasculature. No lymphadenopathy noted in the abdomen or pelvis. Reproductive: Prostate gland is completely obscured by the adjacent bladder mass, likely from direct invasion. Seminal vesicles are not confidently identified. Other: No significant volume of ascites.  No pneumoperitoneum. Musculoskeletal: There are no aggressive appearing lytic or blastic lesions noted in the visualized portions of the skeleton. IMPRESSION: 1. Progressively enlarging heterogeneously enhancing bladder mass which now appears to have eroded through the inferior aspect of the anterior abdominal wall musculature, with small superficial abscess just deep to the skin surface in the low anatomic pelvis. 2. Bilateral nephrostomy tubes are stable in position. No hydroureteronephrosis. 3. Small right pleural effusion lying dependently. 4. Aortic atherosclerosis, in addition to least 3 vessel coronary artery disease. 5. Additional incidental findings, as above, similar to prior studies. Electronically Signed   By: Vinnie Langton M.D.   On: 10/05/2019 18:10   CT ABDOMEN PELVIS W CONTRAST  Result Date: 09/25/2019 CLINICAL DATA:  Acute abdominal pain. Fever. EXAM: CT ABDOMEN AND PELVIS WITH CONTRAST TECHNIQUE: Multidetector CT imaging of the abdomen and pelvis was  performed using the standard protocol following bolus administration of intravenous contrast. CONTRAST:  142mL OMNIPAQUE IOHEXOL 300 MG/ML  SOLN COMPARISON:  09/09/2019 FINDINGS: Lower chest: Aortic atherosclerosis. Extensive coronary artery calcifications. Heart size is normal. No pericardial effusion. Tiny bilateral pleural effusions with minimal bibasilar atelectasis. Hepatobiliary: No focal liver abnormality is seen. Status post cholecystectomy. No biliary dilatation. Pancreas: Unremarkable. No pancreatic ductal dilatation or surrounding inflammatory changes. Spleen: Normal in size without focal abnormality. Adrenals/Urinary Tract: Adrenal glands are normal. Bilateral nephrostomy tubes in place. Slight dilatation of the right renal pelvis and proximal right ureter. 21 mm simple cyst on the mid right kidney. Again noted is a large irregular inhomogeneous mass in the bladder which appears larger than  on the prior study. There are irregular fluid collections containing gas in that region consistent with infection. Gas collection protrudes through the midline of the anterior abdominal wall as demonstrated on the prior exam. Stomach/Bowel: Large amount of stool in the rectum consistent with a fecal impaction. Colostomy in the mid transverse colon. Appendix is normal. No significant change since the prior study. Vascular/Lymphatic: Aortic atherosclerosis. No enlarged abdominal or pelvic lymph nodes. Reproductive: Prostate gland is not identified. Other: No ascites. Musculoskeletal: No acute abnormality. Severe arthritic changes of both hips, right greater than left. IMPRESSION: 1. Interval enlargement of the large irregular inhomogeneous mass in the bladder consistent with the patient's known bladder carcinoma. 2. Large amount of stool in the rectum consistent with fecal impaction, unchanged. 3. Bilateral nephrostomy tubes in place. Slight dilatation of the right renal pelvis and proximal right ureter. 4. Tiny  bilateral pleural effusions with minimal bibasilar atelectasis. 5. Severe arthritic changes of both hips, right greater than left. 6. Aortic Atherosclerosis (ICD10-I70.0). Electronically Signed   By: Lorriane Shire M.D.   On: 09/25/2019 20:47   DG Chest Portable 1 View  Result Date: 09/25/2019 CLINICAL DATA:  Sepsis, mental status decline. EXAM: PORTABLE CHEST 1 VIEW COMPARISON:  Chest CT 08/08/2019 FINDINGS: Power injectable right Port-A-Cath noted, tip projecting over the SVC. Mild bilateral interstitial accentuation in both lungs with some hazy density in the infrahilar regions potentially from atypical pneumonia or noncardiogenic edema. No cardiomegaly. Atherosclerotic calcification of the aortic arch. No blunting of the costophrenic angles. IMPRESSION: 1. Hazy infrahilar airspace opacity in the lungs bilaterally, potentially from atypical pneumonia or noncardiogenic edema. 2. Atherosclerotic calcification of the aortic arch. 3. Power injectable right Port-A-Cath tip: SVC. Electronically Signed   By: Van Clines M.D.   On: 09/25/2019 20:19    Assessment and plan- Patient is a 84 y.o. male with metastatic urothelial carcinoma admitted for lower abdominal swelling secondary to tumor progression through the anterior abdominal wall causing skin breakdown  Patient is currently undergoing palliative radiation treatment which is likely to be done by next week.  Patient and his wife report some improvement in his pain control after starting radiation.  Patient continues to have significant leukemoid reaction likely secondary to underlying malignancy.  He has also been evaluated by infectious disease and the cause of his leukocytosis has not been since found to be secondary to underlying infection.  He is currently off antibiotics.  I again discussed with the patient and his wife that overall he is doing poorly.  Most recent CT scan done in the hospital showed continued tumor progression. Discussed  with the patient and his wife that he is had tumor progression Padcev.  Palliative radiation can help him with his pain but would not control his bladder cancer as a whole.  I do not feel that he is a candidate for any further systemic chemotherapy.  Hospice remains a reasonable option to consider upon completion of radiation treatment.  Patient's wife would like him to be discharged from the hospital and finish his radiation treatment before deciding about further goals of care.  Patient and family not quite accepting of hospice yet.  I will follow the patient as an outpatient next week and continue goals of care discussion with him and his family.  He is a DNR.  Overall prognosis is poor  Leukemoid reaction likely secondary to underlying malignancy.    Visit Diagnosis 1. Urinary tract infection without hematuria, site unspecified   2. Sepsis, due to unspecified  organism, unspecified whether acute organ dysfunction present ([place])   3. Intra-abdominal abscess ([place])     Dr. Randa Evens, MD, MPH [facility] [phone] 10/18/2019 3:42 PM

## 2019-10-18 NOTE — Consult Note (Signed)
[facility] for Electrolyte Monitoring and Replacement   Recent Labs: Potassium (mmol/L)  Date Value  10/18/2019 3.7   Magnesium (mg/dL)  Date Value  10/18/2019 1.8   Calcium (mg/dL)  Date Value  10/18/2019 8.3 (L)   Albumin (g/dL)  Date Value  10/06/2019 2.4 (L)   Phosphorus (mg/dL)  Date Value  10/10/2019 2.5   Sodium (mmol/L)  Date Value  10/18/2019 140   Corrected Ca: 9.5 mg/dL  Assessment: 84 y.o.malewith medical history significant forlocally-metastatic bladder cancer on chemotherapy, urinary tract obstruction with b/l nephrostomy tubes in place, recurrent UTI, htn, colostomy in place, provoked dvt s/p anticoagulation, who presents with an abscess with sepsis - secondary to locally metastatic bladder cancerconcerning forerosionthrough the anterior abdominal wall.  Goal of Therapy:  Electrolytes WNL  Plan:   No further replacement needed at this time.  BMP/Magnesium level in AM.   Pearla Dubonnet ,PharmD Clinical Pharmacist 10/18/2019 1:27 PM

## 2019-10-18 NOTE — Progress Notes (Signed)
Physical Therapy Treatment Patient Details Name: Vincent Poole MRN: [id number] DOB: 1934-12-08 Today's Date: 10/18/2019    History of Present Illness Pt. is an 84 y.o. male with medical history significant for locally-metastatic bladder cancer on chemotherapy, urinary tract obstruction with b/l nephrostomy tubes in place, recurrent UTI, htn, colostomy in place. Pt with PMH of GERD, anxiety, arthritis, history of atrial flutter, R femur fx, HTN, prostate cancer, umbilical hernia, wound eschar of L foot, multiple bladder surgeries/procedures. Pt presents to ED with a red "lump" on his lower abdomen in the suprapubic region for several months, has open/draining; noted at recent hospitalization thought to be 2/2 metastatic bladder cancer. Pt given broad spectrum abx and CT abd ordered.    PT Comments    Pt presented with deficits in strength, transfers, mobility, gait, balance, and activity tolerance.  Pt remained motivated to participate throughout the session but ultimately was limited by significant functional weakness.  Pt required frequent therapeutic rest breaks during therex but actively participated throughout.  Pt required mod to max A for bed mobility tasks and even with +2 max A was unable to come to standing.  Pt's HR varied from low 100's at rest to low 130's with activity with mild dizziness reported in sitting, nursing notified.  Ultimately pt is near total assist for most functional tasks and would not be safe to return to his prior living situation at this time.  Pt will benefit from PT services in a SNF setting upon discharge to safely address above deficits for decreased caregiver assistance and eventual return to PLOF.      Follow Up Recommendations  SNF     Equipment Recommendations  None recommended by PT    Recommendations for Other Services       Precautions / Restrictions Precautions Precautions: Fall Precaution Comments: bilateral nephrostomy tubes, colostomy;  abdominal drain Restrictions Weight Bearing Restrictions: No    Mobility  Bed Mobility Overal bed mobility: Needs Assistance Bed Mobility: Supine to Sit;Sit to Supine     Supine to sit: Mod assist Sit to supine: Mod assist;Max assist   General bed mobility comments: Mod-max A for BLE and trunk control  Transfers                 General transfer comment: Attempts made to stand from EOB with +2 max A with pt unable to clear the surface of the mattress  Ambulation/Gait             General Gait Details: Unable   Stairs             Wheelchair Mobility    Modified Rankin (Stroke Patients Only)       Balance Overall balance assessment: Needs assistance Sitting-balance support: Feet supported Sitting balance-Leahy Scale: Fair Sitting balance - Comments: Some dizziness in sitting this session but pt able to remain in sitting at EOB approx 10 min                                    Cognition Arousal/Alertness: Awake/alert Behavior During Therapy: WFL for tasks assessed/performed Overall Cognitive Status: Within Functional Limits for tasks assessed                                        Exercises Total Joint Exercises Ankle Circles/Pumps: Strengthening;Both;10 reps;15 reps  Quad Sets: Strengthening;Both;10 reps;15 reps Gluteal Sets: Strengthening;Both;10 reps;15 reps Heel Slides: AROM;Both;10 reps Hip ABduction/ADduction: AROM;AAROM;Both;10 reps;15 reps Straight Leg Raises: AROM;AAROM;Both;10 reps;15 reps Long Arc Quad: Strengthening;Both;10 reps;15 reps Knee Flexion: Strengthening;Both;10 reps;15 reps Other Exercises Other Exercises: Static sitting at EOB with multiple hand positions for improved core strength and activity tolerance Other Exercises: HEP education and review for BLE APs, QS, and GS x 10 each every 1-2 hours    General Comments        Pertinent Vitals/Pain Pain Assessment: No/denies pain    Home  Living                      Prior Function            PT Goals (current goals can now be found in the care plan section) Acute Rehab PT Goals Patient Stated Goal: Pt. be able to do more by himself Progress towards PT goals: Not progressing toward goals - comment(Pt limitied by significant functional weakness)    Frequency    Min 2X/week      PT Plan Discharge plan needs to be updated    Co-evaluation              AM-PAC PT "6 Clicks" Mobility   Outcome Measure  Help needed turning from your back to your side while in a flat bed without using bedrails?: A Lot Help needed moving from lying on your back to sitting on the side of a flat bed without using bedrails?: A Lot Help needed moving to and from a bed to a chair (including a wheelchair)?: Total Help needed standing up from a chair using your arms (e.g., wheelchair or bedside chair)?: Total Help needed to walk in hospital room?: Total Help needed climbing 3-5 steps with a railing? : Total 6 Click Score: 8    End of Session   Activity Tolerance: Patient limited by fatigue Patient left: in bed;with bed alarm set;with call bell/phone within reach;with family/visitor present Nurse Communication: Mobility status;Other (comment)(Pt's HR into the low 130s with transfer attempts and seated therex) PT Visit Diagnosis: Other abnormalities of gait and mobility (R26.89);Muscle weakness (generalized) (M62.81);Unsteadiness on feet (R26.81)     Time: 4496-7591 PT Time Calculation (min) (ACUTE ONLY): 39 min  Charges:  $Therapeutic Exercise: 23-37 mins $Therapeutic Activity: 8-22 mins                     D. Scott Deziah Renwick PT, DPT 10/18/19, 3:59 PM

## 2019-10-18 NOTE — Progress Notes (Signed)
Progress Note    WELFORD CHRISTMAS  GQB:[id number] DOB: 06-03-1935  DOA: 10/05/2019 PCP: [name], MD      Brief Narrative:    Medical records reviewed and are as summarized below:  MARTINE TRAGESER is an 84 y.o. male with medical history significant forlocally-metastatic bladder cancer on chemotherapy, urinary tract obstruction with b/l nephrostomy tubes in place, recurrent UTI, hypertension, colostomy in place, provoked dvt s/p anticoagulation, who presents with a red "lump" on his lower abdomen in the suprapubic region for several months.  This was noted at recent hospitalization, thought to be 2/2 metastatic bladder cancer.       Assessment/Plan:   Principal Problem:   Intra-abdominal infection Active Problems:   Sepsis ([place])   Essential hypertension   Prostate cancer (HCC)   Urinary obstruction   Malignant neoplasm of urinary bladder (HCC)   Pressure injury of skin   S/P partial resection of colon   Intra-abdominal abscess (HCC)   Body mass index is 20.48 kg/m.    Sepsis secondary to abdominal wall abscess/significant leukocytosis: Blood cultures did not show any growth.  Consulted ID specialist for guidance on antibiotics and she recommended discontinuation of all antibiotics.  Of note, patient was previously seen by surgeon and he is not a surgical candidate.   I informed the patient and his wife that he is no longer receiving IV antibiotics and IV fluids and is medically stable for discharge.  However, his wife insisted that patient stay another day.  She said that her son was not available to help him take the patient home.  She also said that yesterday when patient worked with PT, he had a near syncopal episode and her oxygen saturation had dropped.  However, there is no documentation of oxygen desaturation or near syncope.  Requested reevaluation by PT.  Initially, PT had recommended home health PT.  However, physical therapist has changed his  recommendation and recommended patient be discharged to skilled nursing facility.  Follow-up with social worker to assist with disposition.  Sinus tachycardia: This is likely due to underlying abdominal wall abscess/malignancy  Metastatic bladder cancer: Patient was seen by follow-up his oncologist today.  Prognosis is poor but patient and his wife still want to continue radiation therapy.  They are not ready for hospice.    Acute on chronic leukocytosis: Likely worsened by infection  Hypokalemia and hypomagnesemia: Improved.   Bilateral nephrostomy tubes: Continue nephrostomy care per nursing.  Stage I sacral decubitus ulcer: This was present on admission.  Continue local wound care.   Family Communication/Anticipated D/C date and plan/Code Status   DVT prophylaxis: SCDs, Lovenox Code Status: DNR Family Communication: Plan discussed with his wife at the bedside Disposition Plan: To be determined      Subjective:   No complaints. He feels ok.    Objective:    Vitals:   10/17/19 1237 10/17/19 2118 10/18/19 0526 10/18/19 1225  BP: 123/60 (!) 115/53 106/67 120/67  Pulse: 95 (!) 113 (!) 105 96  Resp: 16 18 18    Temp: (!) 97.5 F (36.4 C) 98.1 F (36.7 C) 98.6 F (37 C) 98.1 F (36.7 C)  TempSrc: Oral Oral Oral   SpO2: 97% 99% 95% 98%  Weight:      Height:        Intake/Output Summary (Last 24 hours) at 10/18/2019 1723 Last data filed at 10/18/2019 1427 Gross per 24 hour  Intake 720 ml  Output 1910 ml  Net -1190 ml  Filed Weights   10/05/19 1418 10/05/19 2139  Weight: 65.8 kg 66.6 kg    Exam:  GEN: NAD SKIN: No rash EYES: Anicteric ENT: MMM CV: RRR PULM: CTA B ABD: soft, ND, NT, +BS, + ostomy, lower abdominal drain in place CNS: AAO x 3, non focal EXT: No edema or tenderness GU: Mild swelling of penis and scrotum without tenderness or erythema.  Bilateral nephrostomy tubes in place draining amber urine   Data Reviewed:   I have personally  reviewed following labs and imaging studies:  Labs: Labs show the following:   Basic Metabolic Panel: Recent Labs  Lab 10/14/19 0638 10/15/19 0719 10/16/19 0357 10/17/19 0326 10/18/19 1010  NA 141 137 142 139 140  K 3.7 2.9* 4.6 3.5 3.7  CL 112* 106 110 108 106  CO2 22 20* 23 22 25   GLUCOSE 103* 81 91 86 110*  BUN 9 9 11 11 14   CREATININE 0.91 1.06 0.98 0.72 0.79  CALCIUM 8.1* 8.0* 8.5* 8.2* 8.3*  MG 1.7 1.6* 2.2 1.8 1.8   GFR Estimated Creatinine Clearance: 67.1 mL/min (by C-G formula based on SCr of 0.79 mg/dL). Liver Function Tests: No results for input(s): AST, ALT, ALKPHOS, BILITOT, PROT, ALBUMIN in the last 168 hours. No results for input(s): LIPASE, AMYLASE in the last 168 hours. No results for input(s): AMMONIA in the last 168 hours. Coagulation profile No results for input(s): INR, PROTIME in the last 168 hours.  CBC: Recent Labs  Lab 10/13/19 0446 10/14/19 0638 10/15/19 0719 10/16/19 1244 10/17/19 0326 10/18/19 0539  WBC 72.7* 68.8* 73.9* 71.0* 64.4* 66.9*  NEUTROABS 61.8* 57.9* 62.4*  --  54.2* 56.9*  HGB 10.3* 10.1* 10.3* 9.9* 9.9* 10.1*  HCT 32.7* 32.1* 32.5* 31.2* 30.5* 31.2*  MCV 89.6 91.2 88.8 90.4 87.4 88.9  PLT 409* 435* 415* 390 383 394   Cardiac Enzymes: No results for input(s): CKTOTAL, CKMB, CKMBINDEX, TROPONINI in the last 168 hours. BNP (last 3 results) No results for input(s): PROBNP in the last 8760 hours. CBG: No results for input(s): GLUCAP in the last 168 hours. D-Dimer: No results for input(s): DDIMER in the last 72 hours. Hgb A1c: No results for input(s): HGBA1C in the last 72 hours. Lipid Profile: No results for input(s): CHOL, HDL, LDLCALC, TRIG, CHOLHDL, LDLDIRECT in the last 72 hours. Thyroid function studies: No results for input(s): TSH, T4TOTAL, T3FREE, THYROIDAB in the last 72 hours.  Invalid input(s): FREET3 Anemia work up: No results for input(s): VITAMINB12, FOLATE, FERRITIN, TIBC, IRON, RETICCTPCT in the last  72 hours. Sepsis Labs: Recent Labs  Lab 10/15/19 0719 10/16/19 1244 10/17/19 0326 10/18/19 0539  WBC 73.9* 71.0* 64.4* 66.9*    Microbiology No results found for this or any previous visit (from the past 240 hour(s)).  Procedures and diagnostic studies:  No results found.  Medications:   . Chlorhexidine Gluconate Cloth  6 each Topical Daily  . enoxaparin (LOVENOX) injection  40 mg Subcutaneous Q24H  . feeding supplement (ENSURE ENLIVE)  237 mL Oral TID BM  . mirabegron ER  50 mg Oral Daily  . multivitamin with minerals  1 tablet Oral Daily  . sodium chloride flush  5 mL Intravenous Q8H   Continuous Infusions: . sodium chloride Stopped (10/14/19 1547)     LOS: 13 days   Oshen Wlodarczyk  [facility]   *Please refer to [email or web address], password [email or web address] to get updated schedule on who will round on this patient, as hospitalists switch teams weekly. If 7PM-7AM, please  contact night-coverage at [email or web address], password [email or web address] for any overnight needs.  10/18/2019, 5:23 PM

## 2019-10-18 NOTE — NC FL2 (Addendum)
[place] LEVEL OF CARE SCREENING TOOL     IDENTIFICATION  Patient Name: Vincent Poole Birthdate: 07-Jul-1935 Sex: male Admission Date (Current Location): 10/05/2019  [place] and [place] Number:  Engineering geologist and Address:  [facility], [address], [place], [place] [address]      Provider Number: [id number]  Attending Physician Name and Address:  Jennye Boroughs, MD  Relative Name and Phone Number:       Current Level of Care: Hospital Recommended Level of Care: [place] Prior Approval Number:    Date Approved/Denied:   PASRR Number:  [id number]  Discharge Plan: SNF    Current Diagnoses: Patient Active Problem List   Diagnosis Date Noted  . Intra-abdominal abscess ([place])   . Intra-abdominal infection 10/05/2019  . S/P partial resection of colon 07/17/2019  . Protein-calorie malnutrition, severe 06/11/2019  . Abdominal pain, generalized   . Palliative care by specialist   . Urothelial cancer ([place])   . Weakness   . Large bowel obstruction ([place]) 06/06/2019  . Hydronephrosis, right 04/08/2019  . Pressure injury of skin 03/23/2019  . SIRS (systemic inflammatory response syndrome) ([place]) 03/22/2019  . Leukocytosis 02/21/2019  . Attention to nephrostomy ([place]) 11/22/2018  . Complicated UTI (urinary tract infection) 11/14/2018  . Palliative care encounter   . Iron deficiency anemia secondary to inadequate dietary iron intake 06/27/2018  . B12 deficiency 06/27/2018  . History of fracture of right hip 06/26/2018  . History of normocytic normochromic anemia 06/26/2018  . Personal history of bladder cancer 06/26/2018  . Hip fracture ([place]) 05/01/2018  . Malignant neoplasm of urinary bladder ([place]) 01/12/2018  . Goals of care, counseling/discussion 01/12/2018  . History of recurrent UTIs 08/03/2017  . Pyelonephritis 05/31/2017  . UTI (urinary tract infection) 03/04/2017  . Urinary obstruction   . Essential  hypertension 08/30/2016  . History of shingles 08/30/2016  . Prostate cancer ([place]) 08/30/2016  . Urinary retention 08/30/2016  . Medicare annual wellness visit, initial 08/01/2016  . Medicare annual wellness visit, subsequent 08/01/2016  . Sepsis ([place]) 07/11/2016  . Borderline diabetes mellitus 01/26/2016  . Vaccine counseling 01/26/2015  . Lumbar radiculitis 06/24/2014  . OA (osteoarthritis) of hip 06/24/2014  . Malignant neoplasm of prostate ([place]) 01/27/2011    Orientation RESPIRATION BLADDER Height & Weight     Self, Time, Situation, Place  Normal Continent Weight: 146 lb 13.2 oz (66.6 kg) Height:  5\' 11"  (180.3 cm)  BEHAVIORAL SYMPTOMS/MOOD NEUROLOGICAL BOWEL NUTRITION STATUS      Colostomy Diet(see dc summary)  AMBULATORY STATUS COMMUNICATION OF NEEDS Skin   Extensive Assist Verbally Other (Comment)(tumor)                       Personal Care Assistance Level of Assistance  Bathing, Feeding, Dressing Bathing Assistance: Maximum assistance Feeding assistance: Limited assistance Dressing Assistance: Maximum assistance     Functional Limitations Info  Sight, Hearing, Speech Sight Info: Adequate Hearing Info: Adequate Speech Info: Adequate    SPECIAL CARE FACTORS FREQUENCY  PT (By licensed PT), OT (By licensed OT)     PT Frequency: 5 times week OT Frequency: 3 times week            Contractures      Additional Factors Info  Code Status, Allergies Code Status Info: DNR Allergies Info: NKA           Current Medications (10/18/2019):  This is the current hospital active medication list  Current Facility-Administered Medications  Medication Dose Route Frequency Provider Last Rate Last Admin  . 0.9 %  sodium chloride infusion   Intravenous PRN Wouk, Ailene Rud, MD   Stopped at 10/14/19 1547  . Chlorhexidine Gluconate Cloth 2 % PADS 6 each  6 each Topical Daily Jennye Boroughs, MD   6 each at 10/17/19 1000  . enoxaparin (LOVENOX) injection 40 mg  40 mg  Subcutaneous Q24H Jennye Boroughs, MD   40 mg at 10/17/19 2348  . feeding supplement (ENSURE ENLIVE) (ENSURE ENLIVE) liquid 237 mL  237 mL Oral TID BM Jennye Boroughs, MD   237 mL at 10/18/19 1005  . mirabegron ER (MYRBETRIQ) tablet 50 mg  50 mg Oral Daily Gwynne Edinger, MD   50 mg at 10/18/19 1005  . multivitamin with minerals tablet 1 tablet  1 tablet Oral Daily Jennye Boroughs, MD   1 tablet at 10/18/19 1005  . oxyCODONE (Oxy IR/ROXICODONE) immediate release tablet 5 mg  5 mg Oral Q4H PRN Gwynne Edinger, MD   5 mg at 10/14/19 2221  . sodium chloride flush (NS) 0.9 % injection 5 mL  5 mL Intravenous Q8H Sreenath, Sudheer B, MD   5 mL at 10/18/19 2182     Discharge Medications: Please see discharge summary for a list of discharge medications.  Relevant Imaging Results:  Relevant Lab Results:   Additional Information SSN: [id number] LCSW

## 2019-10-18 NOTE — Progress Notes (Signed)
Occupational Therapy Treatment Patient Details Name: Vincent Poole MRN: [id number] DOB: 31-Jan-1935 Today's Date: 10/18/2019    History of present illness Pt. is an 84 y.o. male with medical history significant for locally-metastatic bladder cancer on chemotherapy, urinary tract obstruction with b/l nephrostomy tubes in place, recurrent UTI, htn, colostomy in place. Pt with PMH of GERD, anxiety, arthritis, history of atrial flutter, R femur fx, HTN, prostate cancer, umbilical hernia, wound eschar of L foot, multiple bladder surgeries/procedures. Pt presents to ED with a red "lump" on his lower abdomen in the suprapubic region for several months, has open/draining; noted at recent hospitalization thought to be 2/2 metastatic bladder cancer. Pt given broad spectrum abx and CT abd ordered.   OT comments  Pt seen for OT tx this date to f/u re: fxl activity tolerance and ADLs. Pt requires MOD A with bed mobility, tolerates EOB sitting x15 minutes to participate in both UB g/h tasks with setup and intermittent rest breaks as well as some light, non-resistive UB exercise. Pt very participatory and less c/o dizziness in sitting this date. OT educates re: ankle pumps when dizzy in sitting to promote circulation. Pt demos good understanding. Overall, HHOT is still most appropriate d/c dispo.    Follow Up Recommendations  Home health OT;Supervision - Intermittent    Equipment Recommendations       Recommendations for Other Services      Precautions / Restrictions Precautions Precautions: Fall Precaution Comments: bilateral nephrostomy tubes, colostomy; abdominal drain Restrictions Weight Bearing Restrictions: No       Mobility Bed Mobility Overal bed mobility: Needs Assistance Bed Mobility: Supine to Sit;Sit to Supine     Supine to sit: Mod assist Sit to supine: Mod assist   General bed mobility comments: Pt requires assist for UB with sup to sit and LB mgt with sit to  sup.  Transfers                 General transfer comment: transfer not attempted at this time. Pt fatigued after long sit.    Balance Overall balance assessment: Needs assistance Sitting-balance support: Feet supported Sitting balance-Leahy Scale: Fair Sitting balance - Comments: some dizziness in sitting today, but pt reports much better versus yeterday, tolerates EOB sitting x15 mins                                   ADL either performed or assessed with clinical judgement   ADL       Grooming: Wash/dry hands;Wash/dry face;Oral care;Brushing hair;Sitting;Set up Grooming Details (indicate cue type and reason): Tolerates sitting EOB x15 minutes, 10 of which are spent completing UB g/h self care with setup, increased time required d/t fatigue. MIN verbal cues to encourage pt to initiate rest breaks as needed.                                     Vision Patient Visual Report: No change from baseline     Perception     Praxis      Cognition Arousal/Alertness: Awake/alert Behavior During Therapy: WFL for tasks assessed/performed Overall Cognitive Status: Within Functional Limits for tasks assessed  Exercises Other Exercises Other Exercises: OT engages pt in 1 set x15 reps straight arm raises/shoulder flexion to 90 degrees with pt shaking somewhat after 10th/11th rep. Pt with some increase in RR with activity as to be expected. Tolerates well, MIN visual cues for form and pace.   Shoulder Instructions       General Comments      Pertinent Vitals/ Pain       Pain Assessment: No/denies pain  Home Living                                          Prior Functioning/Environment              Frequency  Min 2X/week        Progress Toward Goals  OT Goals(current goals can now be found in the care plan section)  Progress towards OT goals: Progressing  toward goals  Acute Rehab OT Goals Patient Stated Goal: Pt. be able to do more by himself OT Goal Formulation: With patient Potential to Achieve Goals: Good  Plan Discharge plan remains appropriate;Frequency remains appropriate    Co-evaluation                 AM-PAC OT "6 Clicks" Daily Activity     Outcome Measure   Help from another person eating meals?: A Little Help from another person taking care of personal grooming?: A Little Help from another person toileting, which includes using toliet, bedpan, or urinal?: A Lot Help from another person bathing (including washing, rinsing, drying)?: A Lot Help from another person to put on and taking off regular upper body clothing?: A Lot Help from another person to put on and taking off regular lower body clothing?: A Lot 6 Click Score: 14    End of Session    OT Visit Diagnosis: Muscle weakness (generalized) (M62.81)   Activity Tolerance Patient tolerated treatment well   Patient Left in bed;with call bell/phone within reach;with bed alarm set;with family/visitor present(wife present)   Nurse Communication          Time: 4174-0814 OT Time Calculation (min): 31 min  Charges: OT General Charges $OT Visit: 1 Visit OT Treatments $Self Care/Home Management : 8-22 mins $Therapeutic Exercise: 8-22 mins  Gerrianne Scale, MS, OTR/L ascom [phone] 10/18/19, 2:24 PM

## 2019-10-19 ENCOUNTER — Encounter: Payer: Self-pay | Admitting: Obstetrics and Gynecology

## 2019-10-19 LAB — CBC WITH DIFFERENTIAL/PLATELET
Abs Immature Granulocytes: 3.26 10*3/uL — ABNORMAL HIGH (ref 0.00–0.07)
Basophils Absolute: 0.1 10*3/uL (ref 0.0–0.1)
Basophils Relative: 0 %
Eosinophils Absolute: 0.5 10*3/uL (ref 0.0–0.5)
Eosinophils Relative: 1 %
HCT: 30.5 % — ABNORMAL LOW (ref 39.0–52.0)
Hemoglobin: 9.8 g/dL — ABNORMAL LOW (ref 13.0–17.0)
Immature Granulocytes: 5 %
Lymphocytes Relative: 5 %
Lymphs Abs: 3.7 10*3/uL (ref 0.7–4.0)
MCH: 29.1 pg (ref 26.0–34.0)
MCHC: 32.1 g/dL (ref 30.0–36.0)
MCV: 90.5 fL (ref 80.0–100.0)
Monocytes Absolute: 3 10*3/uL — ABNORMAL HIGH (ref 0.1–1.0)
Monocytes Relative: 4 %
Neutro Abs: 60.2 10*3/uL — ABNORMAL HIGH (ref 1.7–7.7)
Neutrophils Relative %: 85 %
Platelets: 393 10*3/uL (ref 150–400)
RBC: 3.37 MIL/uL — ABNORMAL LOW (ref 4.22–5.81)
RDW: 20.2 % — ABNORMAL HIGH (ref 11.5–15.5)
WBC: 70.8 10*3/uL (ref 4.0–10.5)
nRBC: 0 % (ref 0.0–0.2)

## 2019-10-19 LAB — BASIC METABOLIC PANEL
Anion gap: 8 (ref 5–15)
BUN: 15 mg/dL (ref 8–23)
CO2: 26 mmol/L (ref 22–32)
Calcium: 8 mg/dL — ABNORMAL LOW (ref 8.9–10.3)
Chloride: 104 mmol/L (ref 98–111)
Creatinine, Ser: 0.71 mg/dL (ref 0.61–1.24)
GFR calc Af Amer: >60 mL/min (ref >60)
GFR calc non Af Amer: >60 mL/min (ref >60)
Glucose, Bld: 102 mg/dL — ABNORMAL HIGH (ref 70–99)
Potassium: 3.7 mmol/L (ref 3.5–5.1)
Sodium: 138 mmol/L (ref 135–145)

## 2019-10-19 LAB — MAGNESIUM: Magnesium: 1.7 mg/dL (ref 1.7–2.4)

## 2019-10-19 MED ORDER — MAGNESIUM SULFATE IN D5W 1-5 GM/100ML-% IV SOLN
1.0000 g | Freq: Once | INTRAVENOUS | Status: AC
  Administered 2019-10-19: 1 g via INTRAVENOUS
  Filled 2019-10-19: qty 100

## 2019-10-19 NOTE — Consult Note (Signed)
[facility] for Electrolyte Monitoring and Replacement   Recent Labs: Potassium (mmol/L)  Date Value  10/19/2019 3.7   Magnesium (mg/dL)  Date Value  10/19/2019 1.7   Calcium (mg/dL)  Date Value  10/19/2019 8.0 (L)   Albumin (g/dL)  Date Value  10/06/2019 2.4 (L)   Phosphorus (mg/dL)  Date Value  10/10/2019 2.5   Sodium (mmol/L)  Date Value  10/19/2019 138   Corrected Ca: 9.5 mg/dL  Assessment: 84 y.o.malewith medical history significant forlocally-metastatic bladder cancer on chemotherapy, urinary tract obstruction with b/l nephrostomy tubes in place, recurrent UTI, htn, colostomy in place, provoked dvt s/p anticoagulation, who presents with an abscess with sepsis - secondary to locally metastatic bladder cancerconcerning forerosionthrough the anterior abdominal wall.  Goal of Therapy:  Electrolytes WNL  Plan:   Mg 1 g IV x 1  BMP/Magnesium level in AM.   Tawnya Crook ,PharmD Clinical Pharmacist 10/19/2019 8:01 AM

## 2019-10-19 NOTE — Progress Notes (Addendum)
Progress Note    Vincent Poole  BDZ:[id number] DOB: 11-18-1934  DOA: 10/05/2019 PCP: [name], MD      Brief Narrative:    Medical records reviewed and are as summarized below:  Vincent Poole is an 84 y.o. male with medical history significant forlocally-metastatic bladder cancer on chemotherapy, urinary tract obstruction with b/l nephrostomy tubes in place, recurrent UTI, hypertension, colostomy in place, provoked dvt s/p anticoagulation, who presents with a red "lump" on his lower abdomen in the suprapubic region for several months.  This was noted at recent hospitalization, thought to be 2/2 metastatic bladder cancer.       Assessment/Plan:   Principal Problem:   Intra-abdominal infection Active Problems:   Sepsis ([place])   Essential hypertension   Prostate cancer (HCC)   Urinary obstruction   Malignant neoplasm of urinary bladder (HCC)   Pressure injury of skin   S/P partial resection of colon   Intra-abdominal abscess (HCC)   Body mass index is 20.48 kg/m.    Sepsis secondary to abdominal wall abscess/significant leukocytosis:Off of antibiotics per ID's recommendation.   Sinus tachycardia: This is likely due to underlying abdominal wall abscess/malignancy  Metastatic bladder cancer:  Prognosis is poor but patient and his wife still want to continue radiation therapy.  They are not ready for hospice.    Acute on chronic leukocytosis: Likely worsened by infection  Hypokalemia and hypomagnesemia: Improved.   Bilateral nephrostomy tubes: Continue nephrostomy care per nursing.  Stage I sacral decubitus ulcer: This was present on admission.  Continue local wound care.   Family Communication/Anticipated D/C date and plan/Code Status   DVT prophylaxis: SCDs, Lovenox Code Status: DNR Family Communication: Plan discussed with his wife at the bedside Disposition Plan: SNF in 2 to 3 days pending placement     Subjective:   He has  intermittent abdominal pain. He has no other complaints.   Objective:    Vitals:   10/18/19 2040 10/19/19 0552 10/19/19 1325 10/19/19 2115  BP: 124/60 (!) 115/54 (!) 114/54 105/60  Pulse: (!) 124 (!) 112 (!) 111 (!) 117  Resp: 18 18 18 20   Temp: 98.1 F (36.7 C) 98.6 F (37 C) 98.3 F (36.8 C) 98.6 F (37 C)  TempSrc: Oral Oral  Oral  SpO2: 94% 95% 96% 94%  Weight:      Height:        Intake/Output Summary (Last 24 hours) at 10/19/2019 2126 Last data filed at 10/19/2019 2044 Gross per 24 hour  Intake 295 ml  Output 1280 ml  Net -985 ml   Filed Weights   10/05/19 1418 10/05/19 2139  Weight: 65.8 kg 66.6 kg    Exam:  GEN: NAD SKIN: No rash EYES: anicteric ENT: MMM CV: RRR PULM: CTA B ABD: soft, ND, NT, +BS CNS: AAO x 3, non focal EXT: No edema or tenderness    Data Reviewed:   I have personally reviewed following labs and imaging studies:  Labs: Labs show the following:   Basic Metabolic Panel: Recent Labs  Lab 10/15/19 0719 10/16/19 0357 10/17/19 0326 10/18/19 1010 10/19/19 0348  NA 137 142 139 140 138  K 2.9* 4.6 3.5 3.7 3.7  CL 106 110 108 106 104  CO2 20* 23 22 25 26   GLUCOSE 81 91 86 110* 102*  BUN 9 11 11 14 15   CREATININE 1.06 0.98 0.72 0.79 0.71  CALCIUM 8.0* 8.5* 8.2* 8.3* 8.0*  MG 1.6* 2.2 1.8 1.8 1.7  GFR Estimated Creatinine Clearance: 67.1 mL/min (by C-G formula based on SCr of 0.71 mg/dL). Liver Function Tests: No results for input(s): AST, ALT, ALKPHOS, BILITOT, PROT, ALBUMIN in the last 168 hours. No results for input(s): LIPASE, AMYLASE in the last 168 hours. No results for input(s): AMMONIA in the last 168 hours. Coagulation profile No results for input(s): INR, PROTIME in the last 168 hours.  CBC: Recent Labs  Lab 10/14/19 0638 10/15/19 0719 10/16/19 1244 10/17/19 0326 10/18/19 0539 10/19/19 0348  WBC 68.8* 73.9* 71.0* 64.4* 66.9* 70.8*  NEUTROABS 57.9* 62.4*  --  54.2* 56.9* 60.2*  HGB 10.1* 10.3* 9.9*  9.9* 10.1* 9.8*  HCT 32.1* 32.5* 31.2* 30.5* 31.2* 30.5*  MCV 91.2 88.8 90.4 87.4 88.9 90.5  PLT 435* 415* 390 383 394 393   Cardiac Enzymes: No results for input(s): CKTOTAL, CKMB, CKMBINDEX, TROPONINI in the last 168 hours. BNP (last 3 results) No results for input(s): PROBNP in the last 8760 hours. CBG: No results for input(s): GLUCAP in the last 168 hours. D-Dimer: No results for input(s): DDIMER in the last 72 hours. Hgb A1c: No results for input(s): HGBA1C in the last 72 hours. Lipid Profile: No results for input(s): CHOL, HDL, LDLCALC, TRIG, CHOLHDL, LDLDIRECT in the last 72 hours. Thyroid function studies: No results for input(s): TSH, T4TOTAL, T3FREE, THYROIDAB in the last 72 hours.  Invalid input(s): FREET3 Anemia work up: No results for input(s): VITAMINB12, FOLATE, FERRITIN, TIBC, IRON, RETICCTPCT in the last 72 hours. Sepsis Labs: Recent Labs  Lab 10/16/19 1244 10/17/19 0326 10/18/19 0539 10/19/19 0348  WBC 71.0* 64.4* 66.9* 70.8*    Microbiology No results found for this or any previous visit (from the past 240 hour(s)).  Procedures and diagnostic studies:  No results found.  Medications:   . Chlorhexidine Gluconate Cloth  6 each Topical Daily  . enoxaparin (LOVENOX) injection  40 mg Subcutaneous Q24H  . feeding supplement (ENSURE ENLIVE)  237 mL Oral TID BM  . mirabegron ER  50 mg Oral Daily  . multivitamin with minerals  1 tablet Oral Daily  . sodium chloride flush  5 mL Intravenous Q8H   Continuous Infusions: . sodium chloride 500 mL (10/19/19 1044)     LOS: 14 days   Joyanne Eddinger  [facility]   *Please refer to [email or web address], password [email or web address] to get updated schedule on who will round on this patient, as hospitalists switch teams weekly. If 7PM-7AM, please contact night-coverage at [email or web address], password [email or web address] for any overnight needs.  10/19/2019, 9:26 PM

## 2019-10-20 NOTE — Consult Note (Signed)
[facility] for Electrolyte Monitoring and Replacement   Recent Labs: Potassium (mmol/L)  Date Value  10/19/2019 3.7   Magnesium (mg/dL)  Date Value  10/19/2019 1.7   Calcium (mg/dL)  Date Value  10/19/2019 8.0 (L)   Albumin (g/dL)  Date Value  10/06/2019 2.4 (L)   Phosphorus (mg/dL)  Date Value  10/10/2019 2.5   Sodium (mmol/L)  Date Value  10/19/2019 138   Corrected Ca: 9.5 mg/dL  Assessment: 84 y.o.malewith medical history significant forlocally-metastatic bladder cancer on chemotherapy, urinary tract obstruction with b/l nephrostomy tubes in place, recurrent UTI, htn, colostomy in place, provoked dvt s/p anticoagulation, who presents with an abscess with sepsis - secondary to locally metastatic bladder cancerconcerning forerosionthrough the anterior abdominal wall.  Goal of Therapy:  Electrolytes WNL  Plan:  No electrolyte replacement indicated. Will defer ordering labs in the morning unless otherwise ordered by MD. Pharmacy will continue to follow along.  Tawnya Crook ,PharmD Clinical Pharmacist 10/20/2019 8:28 AM

## 2019-10-20 NOTE — Progress Notes (Signed)
Physical Therapy Treatment Patient Details Name: Vincent Poole MRN: [id number] DOB: March 03, 1935 Today's Date: 10/20/2019    History of Present Illness Pt. is an 84 y.o. male with medical history significant for locally-metastatic bladder cancer on chemotherapy, urinary tract obstruction with b/l nephrostomy tubes in place, recurrent UTI, htn, colostomy in place. Pt with PMH of GERD, anxiety, arthritis, history of atrial flutter, R femur fx, HTN, prostate cancer, umbilical hernia, wound eschar of L foot, multiple bladder surgeries/procedures. Pt presents to ED with a red "lump" on his lower abdomen in the suprapubic region for several months, has open/draining; noted at recent hospitalization thought to be 2/2 metastatic bladder cancer. Pt given broad spectrum abx and CT abd ordered.    PT Comments    Pt presented with deficits in strength, transfers, mobility, gait, balance, and activity tolerance.  Pt required extensive assistance with bed mobility tasks along with verbal and tactile cues for sequencing.  Upon sitting at the EOB the pt presented with fair stability once feet were on the floor.  Pt was able to participate with some sitting therex but was only able to tolerate sitting at the EOB for around 5 min before requesting to return to supine secondary to fatigue.  Pt will benefit from PT services in a SNF setting upon discharge to safely address above deficits for decreased caregiver assistance and eventual return to PLOF.     Follow Up Recommendations  SNF     Equipment Recommendations  None recommended by PT    Recommendations for Other Services       Precautions / Restrictions Precautions Precautions: Fall Precaution Comments: bilateral nephrostomy tubes, colostomy; abdominal drain Restrictions Weight Bearing Restrictions: No    Mobility  Bed Mobility Overal bed mobility: Needs Assistance Bed Mobility: Supine to Sit;Sit to Supine     Supine to sit: Max assist;Mod  assist Sit to supine: Max assist;Mod assist   General bed mobility comments: Mod-max A for BLE and trunk control  Transfers                 General transfer comment: Unable  Ambulation/Gait             General Gait Details: Unable   Stairs             Wheelchair Mobility    Modified Rankin (Stroke Patients Only)       Balance Overall balance assessment: Needs assistance Sitting-balance support: Feet supported Sitting balance-Leahy Scale: Fair Sitting balance - Comments: Some dizziness/nausea in sitting this session but pt able to remain in sitting at EOB approx 5 min                                    Cognition Arousal/Alertness: Awake/alert Behavior During Therapy: WFL for tasks assessed/performed Overall Cognitive Status: Within Functional Limits for tasks assessed                                        Exercises Total Joint Exercises Ankle Circles/Pumps: Strengthening;Both;10 reps Quad Sets: Strengthening;Both;10 reps Gluteal Sets: Strengthening;Both;10 reps Towel Squeeze: Strengthening;Both;10 reps Heel Slides: AROM;Both;10 reps Hip ABduction/ADduction: AROM;Both;10 reps Straight Leg Raises: AROM;AAROM;Both;10 reps Long Arc Quad: Strengthening;Both;10 reps;15 reps Knee Flexion: Strengthening;Both;10 reps;15 reps  Pt/spouse education/review on ensuring heels are floated to prevent pressure sore    General Comments  Pertinent Vitals/Pain Pain Assessment: No/denies pain    Home Living                      Prior Function            PT Goals (current goals can now be found in the care plan section) Progress towards PT goals: Not progressing toward goals - comment(Pt limited by fatigue)    Frequency    Min 2X/week      PT Plan Current plan remains appropriate    Co-evaluation              AM-PAC PT "6 Clicks" Mobility   Outcome Measure  Help needed turning from your  back to your side while in a flat bed without using bedrails?: A Lot Help needed moving from lying on your back to sitting on the side of a flat bed without using bedrails?: A Lot Help needed moving to and from a bed to a chair (including a wheelchair)?: Total Help needed standing up from a chair using your arms (e.g., wheelchair or bedside chair)?: Total Help needed to walk in hospital room?: Total Help needed climbing 3-5 steps with a railing? : Total 6 Click Score: 8    End of Session   Activity Tolerance: Patient limited by fatigue Patient left: in bed;with bed alarm set;with call bell/phone within reach;with family/visitor present Nurse Communication: Mobility status PT Visit Diagnosis: Other abnormalities of gait and mobility (R26.89);Muscle weakness (generalized) (M62.81);Unsteadiness on feet (R26.81)     Time: 5956-3875 PT Time Calculation (min) (ACUTE ONLY): 24 min  Charges:  $Therapeutic Exercise: 8-22 mins $Therapeutic Activity: 8-22 mins                     D. Scott Bethan Adamek PT, DPT 10/20/19, 4:49 PM

## 2019-10-20 NOTE — Progress Notes (Signed)
Progress Note    Vincent Poole  WRU:[id number] DOB: January 07, 1935  DOA: 10/05/2019 PCP: [name], MD      Brief Narrative:    Medical records reviewed and are as summarized below:  Vincent Poole is an 84 y.o. male with medical history significant forlocally-metastatic bladder cancer on chemotherapy, urinary tract obstruction with b/l nephrostomy tubes in place, recurrent UTI, hypertension, colostomy in place, provoked dvt s/p anticoagulation, who presents with a red "lump" on his lower abdomen in the suprapubic region for several months.  This was noted at recent hospitalization, thought to be secondary to metastatic bladder cancer.  He had significant leukocytosis on admission and patient was treated for sepsis secondary to intra-abdominal abscess communicating with the bladder.  He was treated with IV antibiotics 12 days.  ID was consulted for guidance on duration of antibiotics.  Dr. Steva Ready, [place] specialist recommended that antibiotics be discontinued.  He has been receiving radiation therapy while in the hospital.  His prognosis is poor.  He was seen by his oncologist in the hospital who said that patient is not a candidate for any additional chemotherapy.  Patient was seen by the palliative care team.  However, his wife said they are not ready for hospice and they will continue with radiation therapy.  He was evaluated by PT and OT who recommended further rehabilitation at the skilled nursing facility.    Assessment/Plan:   Principal Problem:   Intra-abdominal infection Active Problems:   Sepsis ([place])   Essential hypertension   Prostate cancer (HCC)   Urinary obstruction   Malignant neoplasm of urinary bladder (HCC)   Pressure injury of skin   S/P partial resection of colon   Intra-abdominal abscess ([facility]CC)   Body mass index is 20.48 kg/m.    Sepsis secondary to abdominal wall abscess/significant leukocytosis:Off of antibiotics per ID's recommendation.     Sinus tachycardia: This is likely due to underlying abdominal wall abscess/malignancy.  He is asymptomatic from this  Metastatic bladder cancer:  Prognosis is poor but patient and his wife still want to continue radiation therapy.  They are not ready for hospice.    Acute on chronic leukocytosis: Likely worsened by infection  Hypokalemia and hypomagnesemia: Improved.   Bilateral nephrostomy tubes: Continue nephrostomy care per nursing.  Stage I sacral decubitus ulcer: This was present on admission.  Continue local wound care.   Family Communication/Anticipated D/C date and plan/Code Status   DVT prophylaxis: SCDs, Lovenox Code Status: DNR Family Communication: None Disposition Plan: SNF in 2 to 3 days pending placement     Subjective:   He feels okay.  He has no complaints.  Abdominal pain is better.   Objective:    Vitals:   10/19/19 1325 10/19/19 2115 10/20/19 0527 10/20/19 1204  BP: (!) 114/54 105/60 (!) 107/59 (!) 105/57  Pulse: (!) 111 (!) 117 (!) 118 (!) 115  Resp: 18 20 20 16   Temp: 98.3 F (36.8 C) 98.6 F (37 C) 98.4 F (36.9 C) 98.6 F (37 C)  TempSrc:  Oral Oral Oral  SpO2: 96% 94% 95% 95%  Weight:      Height:        Intake/Output Summary (Last 24 hours) at 10/20/2019 1402 Last data filed at 10/20/2019 1038 Gross per 24 hour  Intake 180 ml  Output 1630 ml  Net -1450 ml   Filed Weights   10/05/19 1418 10/05/19 2139  Weight: 65.8 kg 66.6 kg    Exam:  GEN:  NAD SKIN: lower abdominal drain in place EYES: anicteric ENT: MMM CV: RRR PULM: CTA B ABD: soft, ND, NT, +BS, +colostomy CNS: AAO x 3, non focal EXT: No edema or tenderness GU: B/l nephrostomy tubes draining amber urine   Data Reviewed:   I have personally reviewed following labs and imaging studies:  Labs: Labs show the following:   Basic Metabolic Panel: Recent Labs  Lab 10/15/19 0719 10/16/19 0357 10/17/19 0326 10/18/19 1010 10/19/19 0348  NA 137 142 139 140 138   K 2.9* 4.6 3.5 3.7 3.7  CL 106 110 108 106 104  CO2 20* 23 22 25 26   GLUCOSE 81 91 86 110* 102*  BUN 9 11 11 14 15   CREATININE 1.06 0.98 0.72 0.79 0.71  CALCIUM 8.0* 8.5* 8.2* 8.3* 8.0*  MG 1.6* 2.2 1.8 1.8 1.7   GFR Estimated Creatinine Clearance: 67.1 mL/min (by C-G formula based on SCr of 0.71 mg/dL). Liver Function Tests: No results for input(s): AST, ALT, ALKPHOS, BILITOT, PROT, ALBUMIN in the last 168 hours. No results for input(s): LIPASE, AMYLASE in the last 168 hours. No results for input(s): AMMONIA in the last 168 hours. Coagulation profile No results for input(s): INR, PROTIME in the last 168 hours.  CBC: Recent Labs  Lab 10/14/19 0638 10/15/19 0719 10/16/19 1244 10/17/19 0326 10/18/19 0539 10/19/19 0348  WBC 68.8* 73.9* 71.0* 64.4* 66.9* 70.8*  NEUTROABS 57.9* 62.4*  --  54.2* 56.9* 60.2*  HGB 10.1* 10.3* 9.9* 9.9* 10.1* 9.8*  HCT 32.1* 32.5* 31.2* 30.5* 31.2* 30.5*  MCV 91.2 88.8 90.4 87.4 88.9 90.5  PLT 435* 415* 390 383 394 393   Cardiac Enzymes: No results for input(s): CKTOTAL, CKMB, CKMBINDEX, TROPONINI in the last 168 hours. BNP (last 3 results) No results for input(s): PROBNP in the last 8760 hours. CBG: No results for input(s): GLUCAP in the last 168 hours. D-Dimer: No results for input(s): DDIMER in the last 72 hours. Hgb A1c: No results for input(s): HGBA1C in the last 72 hours. Lipid Profile: No results for input(s): CHOL, HDL, LDLCALC, TRIG, CHOLHDL, LDLDIRECT in the last 72 hours. Thyroid function studies: No results for input(s): TSH, T4TOTAL, T3FREE, THYROIDAB in the last 72 hours.  Invalid input(s): FREET3 Anemia work up: No results for input(s): VITAMINB12, FOLATE, FERRITIN, TIBC, IRON, RETICCTPCT in the last 72 hours. Sepsis Labs: Recent Labs  Lab 10/16/19 1244 10/17/19 0326 10/18/19 0539 10/19/19 0348  WBC 71.0* 64.4* 66.9* 70.8*    Microbiology No results found for this or any previous visit (from the past 240  hour(s)).  Procedures and diagnostic studies:  No results found.  Medications:   . Chlorhexidine Gluconate Cloth  6 each Topical Daily  . enoxaparin (LOVENOX) injection  40 mg Subcutaneous Q24H  . feeding supplement (ENSURE ENLIVE)  237 mL Oral TID BM  . mirabegron ER  50 mg Oral Daily  . multivitamin with minerals  1 tablet Oral Daily  . sodium chloride flush  5 mL Intravenous Q8H   Continuous Infusions: . sodium chloride 500 mL (10/19/19 1044)     LOS: 15 days   Damyn Weitzel  [facility]   *Please refer to [email or web address], password [email or web address] to get updated schedule on who will round on this patient, as hospitalists switch teams weekly. If 7PM-7AM, please contact night-coverage at [email or web address], password [email or web address] for any overnight needs.  10/20/2019, 2:02 PM

## 2019-10-21 ENCOUNTER — Ambulatory Visit
Admission: RE | Admit: 2019-10-21 | Discharge: 2019-10-21 | Disposition: A | Discharge status: Home / Self Care | Payer: Medicare Other | Source: Ambulatory Visit | Attending: Radiation Oncology | Admitting: Radiation Oncology

## 2019-10-21 NOTE — TOC Progression Note (Signed)
Transition of Care [facility]) - Progression Note    Patient Details  Name: Vincent COSMA MRN: [id number] Date of Birth: 1935-03-16  Transition of Care [facility]) CM/SW Contact  Beverly Sessions, RN Phone Number: 10/21/2019, 3:54 PM  Clinical Narrative:    RNCM spoke with [facility].  They are unable to accept admissions at this time  Patient and wife notified.  They are agreeable to a local be search but not not want [place] health care to be pursued.    Updated fl2 sent for signature  Bedsearch extended    Expected Discharge Plan: Home/Self Care Barriers to Discharge: Continued Medical Work up  Expected Discharge Plan and Services Expected Discharge Plan: Home/Self Care       Living arrangements for the past 2 months: Single Family Home                                       Social Determinants of Health (SDOH) Interventions    Readmission Risk Interventions Readmission Risk Prevention Plan 10/10/2019 09/27/2019 04/28/2019  Transportation Screening Complete Complete Complete  PCP or Specialist Appt within 3-5 Days - - Not Complete  [place] or [place] - - Complete  Social Work Consult for [place] Planning/Counseling - - Patient refused  Palliative Care Screening - - -  Medication Review Press photographer) Complete Complete Complete  PCP or Specialist appointment within 3-5 days of discharge - Complete -  [place] or [place] Patient refused (No Data) -  SW Recovery Care/Counseling Consult Complete Complete -  Palliative Care Screening - Not Applicable -  [place] - Not Applicable -  Some recent data might be hidden

## 2019-10-21 NOTE — NC FL2 (Signed)
[place] LEVEL OF CARE SCREENING TOOL     IDENTIFICATION  Patient Name: Vincent Poole Birthdate: 1935-03-13 Sex: male Admission Date (Current Location): 10/05/2019  [place] and [place] Number:  Engineering geologist and Address:  [facility], [address] [place], [place] [address]      Provider Number: [id number]  Attending Physician Name and Address:  Jennye Boroughs, MD  Relative Name and Phone Number:       Current Level of Care: Hospital Recommended Level of Care: [place] Prior Approval Number:    Date Approved/Denied:   PASRR Number:    Discharge Plan: SNF    Current Diagnoses: Patient Active Problem List   Diagnosis Date Noted  . Intra-abdominal abscess ([place])   . Intra-abdominal infection 10/05/2019  . S/P partial resection of colon 07/17/2019  . Protein-calorie malnutrition, severe 06/11/2019  . Abdominal pain, generalized   . Palliative care by specialist   . Urothelial cancer ([place])   . Weakness   . Large bowel obstruction ([place]) 06/06/2019  . Hydronephrosis, right 04/08/2019  . Pressure injury of skin 03/23/2019  . SIRS (systemic inflammatory response syndrome) ([place]) 03/22/2019  . Leukocytosis 02/21/2019  . Attention to nephrostomy ([place]) 11/22/2018  . Complicated UTI (urinary tract infection) 11/14/2018  . Palliative care encounter   . Iron deficiency anemia secondary to inadequate dietary iron intake 06/27/2018  . B12 deficiency 06/27/2018  . History of fracture of right hip 06/26/2018  . History of normocytic normochromic anemia 06/26/2018  . Personal history of bladder cancer 06/26/2018  . Hip fracture ([place]) 05/01/2018  . Malignant neoplasm of urinary bladder ([place]) 01/12/2018  . Goals of care, counseling/discussion 01/12/2018  . History of recurrent UTIs 08/03/2017  . Pyelonephritis 05/31/2017  . UTI (urinary tract infection) 03/04/2017  . Urinary obstruction   . Essential  hypertension 08/30/2016  . History of shingles 08/30/2016  . Prostate cancer ([place]) 08/30/2016  . Urinary retention 08/30/2016  . Medicare annual wellness visit, initial 08/01/2016  . Medicare annual wellness visit, subsequent 08/01/2016  . Sepsis ([place]) 07/11/2016  . Borderline diabetes mellitus 01/26/2016  . Vaccine counseling 01/26/2015  . Lumbar radiculitis 06/24/2014  . OA (osteoarthritis) of hip 06/24/2014  . Malignant neoplasm of prostate ([place]) 01/27/2011    Orientation RESPIRATION BLADDER Height & Weight     Self, Time, Situation, Place  Normal (bilateral nephrostomy tubes) Weight: 66.6 kg Height:  5\' 11"  (180.3 cm)  BEHAVIORAL SYMPTOMS/MOOD NEUROLOGICAL BOWEL NUTRITION STATUS      Colostomy Diet(see dc summary)  AMBULATORY STATUS COMMUNICATION OF NEEDS Skin   Extensive Assist Verbally Other (Comment)(tumor)                       Personal Care Assistance Level of Assistance  Bathing, Feeding, Dressing Bathing Assistance: Maximum assistance Feeding assistance: Limited assistance Dressing Assistance: Maximum assistance     Functional Limitations Info  Sight, Hearing, Speech Sight Info: Adequate Hearing Info: Adequate Speech Info: Adequate    SPECIAL CARE FACTORS FREQUENCY  PT (By licensed PT), OT (By licensed OT)     PT Frequency: 5 times week OT Frequency: 3 times week            Contractures      Additional Factors Info  (abcess drainage pouch,  daily radiation treatments m-f to be completed 12/22) Code Status Info: DNR Allergies Info: NKA           Current Medications (10/21/2019):  This  is the current hospital active medication list Current Facility-Administered Medications  Medication Dose Route Frequency Provider Last Rate Last Admin  . 0.9 %  sodium chloride infusion   Intravenous PRN Gwynne Edinger, MD 10 mL/hr at 10/19/19 1044 500 mL at 10/19/19 1044  . Chlorhexidine Gluconate Cloth 2 % PADS 6 each  6 each Topical Daily Jennye Boroughs, MD   6 each at 10/19/19 0920  . enoxaparin (LOVENOX) injection 40 mg  40 mg Subcutaneous Q24H Jennye Boroughs, MD   40 mg at 10/20/19 2312  . feeding supplement (ENSURE ENLIVE) (ENSURE ENLIVE) liquid 237 mL  237 mL Oral TID BM Jennye Boroughs, MD   237 mL at 10/21/19 0903  . mirabegron ER (MYRBETRIQ) tablet 50 mg  50 mg Oral Daily Gwynne Edinger, MD   50 mg at 10/21/19 9924  . multivitamin with minerals tablet 1 tablet  1 tablet Oral Daily Jennye Boroughs, MD   1 tablet at 10/21/19 0902  . oxyCODONE (Oxy IR/ROXICODONE) immediate release tablet 5 mg  5 mg Oral Q4H PRN Gwynne Edinger, MD   5 mg at 10/21/19 0640  . sodium chloride flush (NS) 0.9 % injection 5 mL  5 mL Intravenous Q8H Sreenath, Sudheer B, MD   5 mL at 10/21/19 2683     Discharge Medications: Please see discharge summary for a list of discharge medications.  Relevant Imaging Results:  Relevant Lab Results:   Additional Information SSN: [id number]  Soma Bachand, Illene [place], RN

## 2019-10-21 NOTE — Progress Notes (Signed)
Progress Note    MCLEAN MOYA  QQI:[id number] DOB: Oct 24, 1935  DOA: 10/05/2019 PCP: [name], MD      Brief Narrative:    Medical records reviewed and are as summarized below:  Vincent Poole is an 84 y.o. male with medical history significant forlocally-metastatic bladder cancer on chemotherapy, urinary tract obstruction with b/l nephrostomy tubes in place, recurrent UTI, hypertension, colostomy in place, provoked dvt s/p anticoagulation, who presents with a red "lump" on his lower abdomen in the suprapubic region for several months.  This was noted at recent hospitalization, thought to be secondary to metastatic bladder cancer.  He had significant leukocytosis on admission and patient was treated for sepsis secondary to intra-abdominal abscess communicating with the bladder.  He was treated with IV antibiotics 12 days.  ID was consulted for guidance on duration of antibiotics.  Dr. Steva Ready, [place] specialist recommended that antibiotics be discontinued.  He has been receiving radiation therapy while in the hospital.  His prognosis is poor.  He was seen by his oncologist in the hospital who said that patient is not a candidate for any additional chemotherapy.  Patient was seen by the palliative care team.  However, his wife said they are not ready for hospice and they will continue with radiation therapy.  He was evaluated by PT and OT who recommended further rehabilitation at the skilled nursing facility.    Assessment/Plan:   Principal Problem:   Intra-abdominal infection Active Problems:   Sepsis ([place])   Essential hypertension   Prostate cancer (HCC)   Urinary obstruction   Malignant neoplasm of urinary bladder (HCC)   Pressure injury of skin   S/P partial resection of colon   Intra-abdominal abscess (HCC)   Body mass index is 20.48 kg/m.    Sepsis secondary to abdominal wall abscess/significant leukocytosis:Off of antibiotics per ID's recommendation.     Sinus tachycardia: This is likely due to underlying abdominal wall abscess/malignancy.  He is asymptomatic from this  Metastatic bladder cancer:  Prognosis is poor but patient and his wife still want to continue radiation therapy.  They are not ready for hospice.    Acute on chronic leukocytosis: Likely worsened by infection  Hypokalemia and hypomagnesemia: Improved.   Bilateral nephrostomy tubes: Continue nephrostomy care per nursing.  Stage I sacral decubitus ulcer: This was present on admission.  Continue local wound care.   Family Communication/Anticipated D/C date and plan/Code Status   DVT prophylaxis: SCDs, Lovenox Code Status: DNR Family Communication: None Disposition Plan: Possible discharge to SNF tomorrow  pending placement     Subjective:   He complains of intermittent abdominal pain otherwise he feels okay.   Objective:    Vitals:   10/20/19 0527 10/20/19 1204 10/20/19 2027 10/21/19 0501  BP: (!) 107/59 (!) 105/57 (!) 114/59 108/64  Pulse: (!) 118 (!) 115 79   Resp: 20 16 20 20   Temp: 98.4 F (36.9 C) 98.6 F (37 C) 98.8 F (37.1 C) 98.6 F (37 C)  TempSrc: Oral Oral Oral Oral  SpO2: 95% 95% 96% 92%  Weight:      Height:        Intake/Output Summary (Last 24 hours) at 10/21/2019 1215 Last data filed at 10/21/2019 1130 Gross per 24 hour  Intake 610 ml  Output 1125 ml  Net -515 ml   Filed Weights   10/05/19 1418 10/05/19 2139  Weight: 65.8 kg 66.6 kg    Exam:  GEN: NAD SKIN: Stage I sacral  decubitus ulcer EYES: anicteric ENT: MMM CV: RRR PULM: CTA B ABD: soft, ND, NT, +BS, +colostomy, lower abdominal drain in place CNS: AAO x 3, non focal EXT: No edema or tenderness GU: B/l nephrostomy tubes in place, draining amber urine   Data Reviewed:   I have personally reviewed following labs and imaging studies:  Labs: Labs show the following:   Basic Metabolic Panel: Recent Labs  Lab 10/15/19 0719 10/16/19 0357 10/17/19 0326  10/18/19 1010 10/19/19 0348  NA 137 142 139 140 138  K 2.9* 4.6 3.5 3.7 3.7  CL 106 110 108 106 104  CO2 20* 23 22 25 26   GLUCOSE 81 91 86 110* 102*  BUN 9 11 11 14 15   CREATININE 1.06 0.98 0.72 0.79 0.71  CALCIUM 8.0* 8.5* 8.2* 8.3* 8.0*  MG 1.6* 2.2 1.8 1.8 1.7   GFR Estimated Creatinine Clearance: 67.1 mL/min (by C-G formula based on SCr of 0.71 mg/dL). Liver Function Tests: No results for input(s): AST, ALT, ALKPHOS, BILITOT, PROT, ALBUMIN in the last 168 hours. No results for input(s): LIPASE, AMYLASE in the last 168 hours. No results for input(s): AMMONIA in the last 168 hours. Coagulation profile No results for input(s): INR, PROTIME in the last 168 hours.  CBC: Recent Labs  Lab 10/15/19 0719 10/16/19 1244 10/17/19 0326 10/18/19 0539 10/19/19 0348  WBC 73.9* 71.0* 64.4* 66.9* 70.8*  NEUTROABS 62.4*  --  54.2* 56.9* 60.2*  HGB 10.3* 9.9* 9.9* 10.1* 9.8*  HCT 32.5* 31.2* 30.5* 31.2* 30.5*  MCV 88.8 90.4 87.4 88.9 90.5  PLT 415* 390 383 394 393   Cardiac Enzymes: No results for input(s): CKTOTAL, CKMB, CKMBINDEX, TROPONINI in the last 168 hours. BNP (last 3 results) No results for input(s): PROBNP in the last 8760 hours. CBG: No results for input(s): GLUCAP in the last 168 hours. D-Dimer: No results for input(s): DDIMER in the last 72 hours. Hgb A1c: No results for input(s): HGBA1C in the last 72 hours. Lipid Profile: No results for input(s): CHOL, HDL, LDLCALC, TRIG, CHOLHDL, LDLDIRECT in the last 72 hours. Thyroid function studies: No results for input(s): TSH, T4TOTAL, T3FREE, THYROIDAB in the last 72 hours.  Invalid input(s): FREET3 Anemia work up: No results for input(s): VITAMINB12, FOLATE, FERRITIN, TIBC, IRON, RETICCTPCT in the last 72 hours. Sepsis Labs: Recent Labs  Lab 10/16/19 1244 10/17/19 0326 10/18/19 0539 10/19/19 0348  WBC 71.0* 64.4* 66.9* 70.8*    Microbiology No results found for this or any previous visit (from the past 240  hour(s)).  Procedures and diagnostic studies:  No results found.  Medications:   . Chlorhexidine Gluconate Cloth  6 each Topical Daily  . enoxaparin (LOVENOX) injection  40 mg Subcutaneous Q24H  . feeding supplement (ENSURE ENLIVE)  237 mL Oral TID BM  . mirabegron ER  50 mg Oral Daily  . multivitamin with minerals  1 tablet Oral Daily  . sodium chloride flush  5 mL Intravenous Q8H   Continuous Infusions: . sodium chloride 500 mL (10/19/19 1044)     LOS: 16 days   Makalia Bare  [facility]   *Please refer to [email or web address], password [email or web address] to get updated schedule on who will round on this patient, as hospitalists switch teams weekly. If 7PM-7AM, please contact night-coverage at [email or web address], password [email or web address] for any overnight needs.  10/21/2019, 12:15 PM

## 2019-10-21 NOTE — Progress Notes (Signed)
Hematology/Oncology Consult note [facility]  [phone] [phone]  Patient Care Team: [name], MD as PCP - General (Family Medicine)   Name of the patient: Vincent Poole  [id number]  02-13-1935   Date of visit: 10/21/2019'   Interval history-patient is resting comfortably in his bed today.  Wife at his bedside.  Patient is still considerably fatigued and mostly bedbound  ECOG PS- 4 Pain scale- 0   Review of systems- Review of Systems  Constitutional: Positive for malaise/fatigue. Negative for chills, fever and weight loss.  HENT: Negative for congestion, ear discharge and nosebleeds.   Eyes: Negative for blurred vision.  Respiratory: Negative for cough, hemoptysis, sputum production, shortness of breath and wheezing.   Cardiovascular: Negative for chest pain, palpitations, orthopnea and claudication.  Gastrointestinal: Negative for abdominal pain, blood in stool, constipation, diarrhea, heartburn, melena, nausea and vomiting.  Genitourinary: Negative for dysuria, flank pain, frequency, hematuria and urgency.  Musculoskeletal: Negative for back pain, joint pain and myalgias.  Skin: Negative for rash.  Neurological: Negative for dizziness, tingling, focal weakness, seizures, weakness and headaches.  Endo/Heme/Allergies: Does not bruise/bleed easily.  Psychiatric/Behavioral: Negative for depression and suicidal ideas. The patient does not have insomnia.       No Known Allergies   Past Medical History:  Diagnosis Date  . Anxiety   . Arthritis   . Bladder cancer ([place])   . Dysrhythmia 07/2018   history of atrial flutter that worsens with anxiety  . Femur fracture, right ([place]) 05/01/2018  . GERD (gastroesophageal reflux disease)   . History of recent blood transfusion 05/2018  . Hypertension   . Iron deficiency anemia 09/14/2018  . Prostate cancer ([place]) 07/2018   cancer growing in prostate but not prostate  cancer, it is from the bladder  . Umbilical hernia 61/6073  . Urinary retention 2019   foley catheter place 11/2017  . UTI (urinary tract infection) 2019   frequent UTI's over last year  . Wound eschar of foot 07/2018   left heal getting wrapped and requiring antibiotic cream. cracks open with weight bearing.     Past Surgical History:  Procedure Laterality Date  . CARPAL TUNNEL RELEASE Right   . CHOLECYSTECTOMY  2004  . COLOSTOMY N/A 06/08/2019   Procedure: COLOSTOMY;  Surgeon: Herbert Pun, MD;  Location: [facility];  Service: General;  Laterality: N/A;  . CYSTOGRAM  07/18/2018   Procedure: CYSTOGRAM;  Surgeon: [name], MD;  Location: [facility];  Service: Urology;;  . Consuela Mimes W/ RETROGRADES Bilateral 07/18/2018   Procedure: CYSTOSCOPY WITH RETROGRADE PYELOGRAM;  Surgeon: [name], MD;  Location: [facility];  Service: Urology;  Laterality: Bilateral;  . CYSTOSCOPY W/ URETERAL STENT PLACEMENT Bilateral 12/27/2017   Procedure: CYSTOSCOPY WITH RETROGRADE PYELOGRAM/URETERAL STENT PLACEMENT;  Surgeon: [name], MD;  Location: [facility];  Service: Urology;  Laterality: Bilateral;  . CYSTOSCOPY W/ URETERAL STENT PLACEMENT Bilateral 07/18/2018   Procedure: CYSTOSCOPY WITH STENT REPLACEMENT (exchange);  Surgeon: [name], MD;  Location: [facility];  Service: Urology;  Laterality: Bilateral;  . CYSTOSCOPY WITH STENT PLACEMENT Bilateral 11/12/2018   Procedure: [place] WITH STENT Exchange;  Surgeon: [name], MD;  Location: [facility];  Service: Urology;  Laterality: Bilateral;  . INTRAMEDULLARY (IM) NAIL INTERTROCHANTERIC Right 05/02/2018   Procedure: INTRAMEDULLARY (IM) NAIL INTERTROCHANTRIC;  Surgeon: [name], MD;  Location: [facility];  Service: Orthopedics;  Laterality: Right;  . IR NEPHROSTOMY EXCHANGE LEFT  03/25/2019  . IR NEPHROSTOMY EXCHANGE LEFT  04/19/2019  . IR NEPHROSTOMY EXCHANGE LEFT  05/31/2019  . IR NEPHROSTOMY EXCHANGE LEFT  07/19/2019  . IR  NEPHROSTOMY EXCHANGE LEFT  09/13/2019  . IR NEPHROSTOMY EXCHANGE RIGHT  03/25/2019  . IR NEPHROSTOMY EXCHANGE RIGHT  04/19/2019  . IR NEPHROSTOMY EXCHANGE RIGHT  05/31/2019  . IR NEPHROSTOMY EXCHANGE RIGHT  07/19/2019  . IR NEPHROSTOMY EXCHANGE RIGHT  09/13/2019  . IR NEPHROSTOMY PLACEMENT LEFT  02/23/2019  . IR NEPHROSTOMY PLACEMENT RIGHT  02/23/2019  . LAPAROTOMY N/A 06/08/2019   Procedure: EXPLORATORY LAPAROTOMY;  Surgeon: Herbert Pun, MD;  Location: [facility];  Service: General;  Laterality: N/A;  . LEG TENDON SURGERY Right 1958  . PORTA CATH INSERTION N/A 01/22/2018   Procedure: PORTA CATH INSERTION;  Surgeon: [name], MD;  Location: [place] [facility];  Service: Cardiovascular;  Laterality: N/A;  . TRANSURETHRAL RESECTION OF BLADDER TUMOR N/A 12/27/2017   Procedure: TRANSURETHRAL RESECTION OF BLADDER TUMOR (TURBT);  Surgeon: [name], MD;  Location: [facility];  Service: Urology;  Laterality: N/A;  . TRANSURETHRAL RESECTION OF BLADDER TUMOR N/A 01/15/2018   Procedure: TRANSURETHRAL RESECTION OF BLADDER TUMOR (TURBT);  Surgeon: [name], MD;  Location: [facility];  Service: Urology;  Laterality: N/A;  Need 2 hrs for this case please  . TRANSURETHRAL RESECTION OF BLADDER TUMOR N/A 07/18/2018   Procedure: TRANSURETHRAL RESECTION OF BLADDER TUMOR (TURBT);  Surgeon: [name], MD;  Location: [facility];  Service: Urology;  Laterality: N/A;    Social History   Socioeconomic History  . Marital status: Married    Spouse name: ruth  . Number of children: Not on file  . Years of education: Not on file  . Highest education level: Not on file  Occupational History  . Occupation: retired    Comment: Therapist, [employer]  Tobacco Use  . Smoking status: Never Smoker  . Smokeless tobacco: Never Used  Substance and Sexual Activity  . Alcohol use: No    Alcohol/week: 0.0 standard drinks  . Drug use: No  . Sexual activity: Not Currently  Other Topics Concern  . Not on file    Social History Narrative  . Not on file   Social Determinants of Health   Financial Resource Strain: Unknown  . Difficulty of Paying Living Expenses: Patient refused  Food Insecurity: Unknown  . Worried About Charity fundraiser in the Last Year: Patient refused  . Ran Out of Food in the Last Year: Patient refused  Transportation Needs: Unknown  . Lack of Transportation (Medical): Patient refused  . Lack of Transportation (Non-Medical): Patient refused  Physical Activity: Unknown  . Days of Exercise per Week: Patient refused  . Minutes of Exercise per Session: Patient refused  Stress: Unknown  . Feeling of Stress : Patient refused  Social Connections: Unknown  . Frequency of Communication with Friends and Family: Patient refused  . Frequency of Social Gatherings with Friends and Family: Patient refused  . Attends Religious Services: Patient refused  . Active Member of Clubs or Organizations: Patient refused  . Attends Archivist Meetings: Patient refused  . Marital Status: Patient refused  Intimate Partner Violence: Unknown  . Fear of Current or Ex-Partner: Patient refused  . Emotionally Abused: Patient refused  . Physically Abused: Patient refused  . Sexually Abused: Patient refused    Family History  Problem Relation Age of Onset  . Cancer Mother   . Chronic Renal Failure Mother   . Heart disease Father  Current Facility-Administered Medications:  .  0.9 %  sodium chloride infusion, , Intravenous, PRN, Wouk, Ailene Rud, MD, Last Rate: 10 mL/hr at 10/19/19 1044, 500 mL at 10/19/19 1044 .  Chlorhexidine Gluconate Cloth 2 % PADS 6 each, 6 each, Topical, Daily, Jennye Boroughs, MD, 6 each at 10/19/19 0920 .  enoxaparin (LOVENOX) injection 40 mg, 40 mg, Subcutaneous, Q24H, Jennye Boroughs, MD, 40 mg at 10/20/19 2312 .  feeding supplement (ENSURE ENLIVE) (ENSURE ENLIVE) liquid 237 mL, 237 mL, Oral, TID BM, Jennye Boroughs, MD, 237 mL at 10/21/19 1354 .   mirabegron ER (MYRBETRIQ) tablet 50 mg, 50 mg, Oral, Daily, Wouk, Ailene Rud, MD, 50 mg at 10/21/19 0903 .  multivitamin with minerals tablet 1 tablet, 1 tablet, Oral, Daily, Jennye Boroughs, MD, 1 tablet at 10/21/19 0902 .  oxyCODONE (Oxy IR/ROXICODONE) immediate release tablet 5 mg, 5 mg, Oral, Q4H PRN, Wouk, Ailene Rud, MD, 5 mg at 10/21/19 0640 .  sodium chloride flush (NS) 0.9 % injection 5 mL, 5 mL, Intravenous, Q8H, Sreenath, Sudheer B, MD, 5 mL at 10/21/19 1355  Physical exam:  Vitals:   10/20/19 2027 10/21/19 0501 10/21/19 1246 10/21/19 1400  BP: (!) 114/59 108/64 (!) 107/55   Pulse: 79  (!) 118 (!) 105  Resp: '20 20 20   '$ Temp: 98.8 F (37.1 C) 98.6 F (37 C) 98.3 F (36.8 C)   TempSrc: Oral Oral Oral   SpO2: 96% 92% 95%   Weight:      Height:       Physical Exam Constitutional:      General: He is not in acute distress. HENT:     Head: Normocephalic and atraumatic.  Eyes:     Pupils: Pupils are equal, round, and reactive to light.  Cardiovascular:     Rate and Rhythm: Normal rate and regular rhythm.     Heart sounds: Normal heart sounds.  Pulmonary:     Effort: Pulmonary effort is normal.     Breath sounds: Normal breath sounds.  Abdominal:     General: Bowel sounds are normal.     Palpations: Abdomen is soft.     Comments: Bilateral nephrostomy tubes in place, diverting colostomy in place.  Lower abdominal central open wound covered with ostomy bag  Musculoskeletal:     Cervical back: Normal range of motion.  Skin:    General: Skin is warm and dry.  Neurological:     Mental Status: He is alert and oriented to person, place, and time.      CMP Latest Ref Rng & Units 10/19/2019  Glucose 70 - 99 mg/dL 102(H)  BUN 8 - 23 mg/dL 15  Creatinine 0.61 - 1.24 mg/dL 0.71  Sodium 135 - 145 mmol/L 138  Potassium 3.5 - 5.1 mmol/L 3.7  Chloride 98 - 111 mmol/L 104  CO2 22 - 32 mmol/L 26  Calcium 8.9 - 10.3 mg/dL 8.0(L)  Total Protein 6.5 - 8.1 g/dL -  Total  Bilirubin 0.3 - 1.2 mg/dL -  Alkaline Phos 38 - 126 U/L -  AST 15 - 41 U/L -  ALT 0 - 44 U/L -   CBC Latest Ref Rng & Units 10/19/2019  WBC 4.0 - 10.5 K/uL 70.8(HH)  Hemoglobin 13.0 - 17.0 g/dL 9.8(L)  Hematocrit 39.0 - 52.0 % 30.5(L)  Platelets 150 - 400 K/uL 393    '@IMAGES'$ @  EEG  Result Date: 09/26/2019 Alexis Goodell, MD     09/26/2019  5:56 PM ELECTROENCEPHALOGRAM REPORT Patient: Orvil Faraone  [place]       Room #: 240A-AA EEG No. ID: [id number] Age: 84 y.o.        Sex: male Referring Physician: Posey Pronto Report Date:  09/26/2019       Interpreting Physician: Alexis Goodell History: JASRAJ LAPPE is an 84 y.o. male with seizure like activity Medications: Myrbetriq, Cefepime Conditions of Recording:  This is a 21 channel routine scalp EEG performed with bipolar and monopolar montages arranged in accordance to the international 10/20 system of electrode placement. One channel was dedicated to EKG recording. The patient is in the awake state. Description:  Artifact is prominent during the recording often obscuring the background rhythm. When able to be visualized the background is slow and poorly organized.   It consists of a low voltage, mixture of theta activity and polymorphic delta activity that is diffusely distributed and continuous.  Theta activity is most predominant.  No epileptiform activity is noted.  The patient does not drowse or sleep. Hyperventilation was not performed.  Intermittent photic stimulation was performed but failed to illicit any change in the tracing. IMPRESSION: This is an abnormal EEG secondary to general background slowing.  This finding may be seen with a diffuse disturbance that is etiologically nonspecific, but may include a metabolic encephalopathy, among other possibilities.  No epileptiform activity is noted.  Alexis Goodell, MD Neurology [phone] 09/26/2019, 5:51 PM   CT Head Wo Contrast  Result Date: 09/25/2019 CLINICAL DATA:  Seizure EXAM: CT HEAD WITHOUT  CONTRAST TECHNIQUE: Contiguous axial images were obtained from the base of the skull through the vertex without intravenous contrast. COMPARISON:  None. FINDINGS: Brain: There is atrophy and chronic small vessel disease changes. No acute intracranial abnormality. Specifically, no hemorrhage, hydrocephalus, mass lesion, acute infarction, or significant intracranial injury. Vascular: No hyperdense vessel or unexpected calcification. Skull: No acute calvarial abnormality. Sinuses/Orbits: Visualized paranasal sinuses and mastoids clear. Orbital soft tissues unremarkable. Other: None IMPRESSION: Atrophy, chronic microvascular disease. No acute intracranial abnormality. Electronically Signed   By: Rolm Baptise M.D.   On: 09/25/2019 20:42   CT ABDOMEN PELVIS W CONTRAST  Result Date: 10/13/2019 CLINICAL DATA:  Abdominal pain, persistent leukocytosis, suspected abscess, re-evaluate anterior abdominal abscess; history of prostate cancer, locally invasive bladder cancer, hypertension, GERD EXAM: CT ABDOMEN AND PELVIS WITH CONTRAST TECHNIQUE: Multidetector CT imaging of the abdomen and pelvis was performed using the standard protocol following bolus administration of intravenous contrast. Sagittal and coronal MPR images reconstructed from axial data set. CONTRAST:  155m OMNIPAQUE IOHEXOL 300 MG/ML SOLN IV. No oral contrast. COMPARISON:  10/05/2019 FINDINGS: Lower chest: Bibasilar small pleural effusions and compressive atelectasis of the lower lobes. Hepatobiliary: Gallbladder surgically absent.  Liver unremarkable. Pancreas: Atrophic without mass Spleen: Normal appearance Adrenals/Urinary Tract: Adrenal glands normal appearance. BILATERAL nephrostomy tubes. Mild BILATERAL ureteral dilatation greater on LEFT. Small BILATERAL renal cysts. Large enhancing mass arising from the bladder consistent with bladder neoplasm with invasion of the pre vesicle space and anterior abdominal wall, mass overall measuring 14.3 x 8.2 x 10.0  cm. Decreased air within the subcutaneous loculation, with this collection now clearly appearing to communicate with the bladder lumen rather than a separate loculated collection. Stomach/Bowel: Loop colostomy mid abdomen. Slightly increased stool in RIGHT colon and within rectum. Small bowel loops unremarkable. Stomach normal appearance. Normal appendix. Vascular/Lymphatic: Atherosclerotic calcifications aorta, iliac arteries, femoral arteries. Aorta normal caliber. No adenopathy. Reproductive: Minimal prostatic enlargement. Several clips within the prostate gland with suspect prior TURP. Other: No free air or free  fluid.  No hernia. Musculoskeletal: Bones demineralized. Advanced degenerative changes of both hips greater on RIGHT with prior proximal RIGHT femoral ORIF. IMPRESSION: Slight increase in size of bladder neoplasm since previous exam with evidence of invasion of the anterior abdominal wall. The previously identified abscess collection anterior to the bladder appears to communicate with the bladder lumen and demonstrates less air on the current study, otherwise unchanged. BILATERAL distal ureteral obstruction with BILATERAL nephrostomy tubes. Small BILATERAL pleural effusions and bibasilar atelectasis. Electronically Signed   By: Lavonia Dana M.D.   On: 10/13/2019 15:47   CT ABDOMEN PELVIS W CONTRAST  Result Date: 10/05/2019 CLINICAL DATA:  84 year old male with history of lower abdominal pain from an abscess. EXAM: CT ABDOMEN AND PELVIS WITH CONTRAST TECHNIQUE: Multidetector CT imaging of the abdomen and pelvis was performed using the standard protocol following bolus administration of intravenous contrast. CONTRAST:  159m OMNIPAQUE IOHEXOL 300 MG/ML  SOLN COMPARISON:  CT the abdomen and pelvis 09/25/2019. FINDINGS: Lower chest: Small right pleural effusion lying dependently. Areas of subsegmental atelectasis in the lower lobes of the lungs bilaterally. Atherosclerotic calcifications in the  descending thoracic aorta as well as the left anterior descending, left circumflex and right coronary arteries. Hepatobiliary: No suspicious cystic or solid hepatic lesions. No intra or extrahepatic biliary ductal dilatation. Status post cholecystectomy. Pancreas: No pancreatic mass. No pancreatic ductal dilatation. No pancreatic or peripancreatic fluid collections or inflammatory changes. Spleen: Unremarkable. Adrenals/Urinary Tract: Percutaneous nephrostomy tubes are noted with tips reformed in the renal collecting systems bilaterally. 2.8 cm low-attenuation lesion in the anterior aspect of the interpolar region of the right kidney, compatible with a simple cyst. Subcentimeter low-attenuation lesion in the anterior aspect of the lower pole the left kidney, too small to characterize, but statistically likely to represent a tiny cyst. No hydroureteronephrosis. Urinary bladder is enlarged measuring approximately 8.6 x 14.5 x 8.8 cm and grossly distorted with extensive multifocal mural thickening and widespread areas of internal soft tissue thickening and enhancing soft tissue, with a combination of fluid and gas within the bladder, concerning for progressively enlarging bladder neoplasm. This appears to extend through the lower anterior abdominal wall where there is a small gas and fluid collection in the subcutaneous fat immediately superficial to the anterior abdominal wall musculature measuring 8.0 x 1.7 x 5.0 cm (axial image 68 of series 2 and sagittal image 64 of series 6). Bilateral adrenal glands are normal in appearance. Stomach/Bowel: Normal appearance of the stomach. No pathologic dilatation of small bowel or colon. A few scattered colonic diverticulae are noted, without surrounding inflammatory changes to suggest an acute diverticulitis at this time. Mid transverse colon loop colostomy. Normal appendix. Vascular/Lymphatic: Aortic atherosclerosis, without evidence of aneurysm or dissection in the abdominal  or pelvic vasculature. No lymphadenopathy noted in the abdomen or pelvis. Reproductive: Prostate gland is completely obscured by the adjacent bladder mass, likely from direct invasion. Seminal vesicles are not confidently identified. Other: No significant volume of ascites.  No pneumoperitoneum. Musculoskeletal: There are no aggressive appearing lytic or blastic lesions noted in the visualized portions of the skeleton. IMPRESSION: 1. Progressively enlarging heterogeneously enhancing bladder mass which now appears to have eroded through the inferior aspect of the anterior abdominal wall musculature, with small superficial abscess just deep to the skin surface in the low anatomic pelvis. 2. Bilateral nephrostomy tubes are stable in position. No hydroureteronephrosis. 3. Small right pleural effusion lying dependently. 4. Aortic atherosclerosis, in addition to least 3 vessel coronary artery disease. 5. Additional incidental  findings, as above, similar to prior studies. Electronically Signed   By: Vinnie Langton M.D.   On: 10/05/2019 18:10   CT ABDOMEN PELVIS W CONTRAST  Result Date: 09/25/2019 CLINICAL DATA:  Acute abdominal pain. Fever. EXAM: CT ABDOMEN AND PELVIS WITH CONTRAST TECHNIQUE: Multidetector CT imaging of the abdomen and pelvis was performed using the standard protocol following bolus administration of intravenous contrast. CONTRAST:  159m OMNIPAQUE IOHEXOL 300 MG/ML  SOLN COMPARISON:  09/09/2019 FINDINGS: Lower chest: Aortic atherosclerosis. Extensive coronary artery calcifications. Heart size is normal. No pericardial effusion. Tiny bilateral pleural effusions with minimal bibasilar atelectasis. Hepatobiliary: No focal liver abnormality is seen. Status post cholecystectomy. No biliary dilatation. Pancreas: Unremarkable. No pancreatic ductal dilatation or surrounding inflammatory changes. Spleen: Normal in size without focal abnormality. Adrenals/Urinary Tract: Adrenal glands are normal. Bilateral  nephrostomy tubes in place. Slight dilatation of the right renal pelvis and proximal right ureter. 21 mm simple cyst on the mid right kidney. Again noted is a large irregular inhomogeneous mass in the bladder which appears larger than on the prior study. There are irregular fluid collections containing gas in that region consistent with infection. Gas collection protrudes through the midline of the anterior abdominal wall as demonstrated on the prior exam. Stomach/Bowel: Large amount of stool in the rectum consistent with a fecal impaction. Colostomy in the mid transverse colon. Appendix is normal. No significant change since the prior study. Vascular/Lymphatic: Aortic atherosclerosis. No enlarged abdominal or pelvic lymph nodes. Reproductive: Prostate gland is not identified. Other: No ascites. Musculoskeletal: No acute abnormality. Severe arthritic changes of both hips, right greater than left. IMPRESSION: 1. Interval enlargement of the large irregular inhomogeneous mass in the bladder consistent with the patient's known bladder carcinoma. 2. Large amount of stool in the rectum consistent with fecal impaction, unchanged. 3. Bilateral nephrostomy tubes in place. Slight dilatation of the right renal pelvis and proximal right ureter. 4. Tiny bilateral pleural effusions with minimal bibasilar atelectasis. 5. Severe arthritic changes of both hips, right greater than left. 6. Aortic Atherosclerosis (ICD10-I70.0). Electronically Signed   By: JLorriane ShireM.D.   On: 09/25/2019 20:47   DG Chest Portable 1 View  Result Date: 09/25/2019 CLINICAL DATA:  Sepsis, mental status decline. EXAM: PORTABLE CHEST 1 VIEW COMPARISON:  Chest CT 08/08/2019 FINDINGS: Power injectable right Port-A-Cath noted, tip projecting over the SVC. Mild bilateral interstitial accentuation in both lungs with some hazy density in the infrahilar regions potentially from atypical pneumonia or noncardiogenic edema. No cardiomegaly. Atherosclerotic  calcification of the aortic arch. No blunting of the costophrenic angles. IMPRESSION: 1. Hazy infrahilar airspace opacity in the lungs bilaterally, potentially from atypical pneumonia or noncardiogenic edema. 2. Atherosclerotic calcification of the aortic arch. 3. Power injectable right Port-A-Cath tip: SVC. Electronically Signed   By: WVan ClinesM.D.   On: 09/25/2019 20:19     Assessment and plan- Patient is a 84y.o. male with locally advanced metastatic urothelial carcinoma admitted for lower abdominal wound dehiscence secondary to malignancy  Met with patient's wife at the bedside.  Patient is currently undergoing palliative radiation and awaiting placement in a rehab that would allow for outpatient radiation.  Patient's wife understands that given his present performance status and advanced malignancy he is not a candidate for further systemic chemotherapy.  Patient's wife would like him to complete his radiation treatment and continue caring for him at home but is not ready for hospice.  He has had prior bad experience with hospice and does not wish to  consider it at this time for her husband.  She understands that overall he is failing and his prognosis was poor.  Leukemoid reaction secondary to underlying malignancy.  Continue to monitor he is currently off antibiotics     Visit Diagnosis 1. Urinary tract infection without hematuria, site unspecified   2. Sepsis, due to unspecified organism, unspecified whether acute organ dysfunction present ([place])   3. Intra-abdominal abscess ([place])      Dr. Randa Evens, MD, MPH [facility] at [facility] [phone] 10/21/2019 5:08 PM

## 2019-10-21 NOTE — Care Management Important Message (Signed)
Important Message  Patient Details  Name: NASEEM VARDEN MRN: [id number] Date of Birth: 10-Jul-1935   Medicare Important Message Given:  Yes     Dannette Barbara 10/21/2019, 3:38 PM

## 2019-10-21 NOTE — Plan of Care (Signed)
Patient doing ok today.  He went for his radiation treatment this morning and tolerated it well.  Nephrostomy tubes with adequate output.  Colostomy with adequate output.  Patient having some discomfort but has not wanted any pain medication.  Tolerated small amounts of meals but he has drank his ensures.  No significant changes.

## 2019-10-21 NOTE — Progress Notes (Addendum)
Occupational Therapy Treatment Patient Details Name: Vincent Poole MRN: [id number] DOB: August 01, 1935 Today's Date: 10/21/2019    History of present illness Pt. is an 84 y.o. male with medical history significant for locally-metastatic bladder cancer on chemotherapy, urinary tract obstruction with b/l nephrostomy tubes in place, recurrent UTI, htn, colostomy in place. Pt with PMH of GERD, anxiety, arthritis, history of atrial flutter, R femur fx, HTN, prostate cancer, umbilical hernia, wound eschar of L foot, multiple bladder surgeries/procedures. Pt presents to ED with a red "lump" on his lower abdomen in the suprapubic region for several months, has open/draining; noted at recent hospitalization thought to be 2/2 metastatic bladder cancer. Pt given broad spectrum abx and CT abd ordered.   OT comments  Pt. perfromed supine to sit with maxA for trunk control, and alignment. Pt. with very limited tolerance for sitting at the EOB for 14min, and required increased support through the UEs, and trunk today. Pt. Was unable to engage his UEs in light grooming tasks at the EOB this morning, howver was able to at bed level. Pt.'s HR at the EOB 118 bpms, 70 bpms when back in bed. Pt. Reported nausea at while sititng at the EOB. Pt. Continues to benefit from OT services for ADL training, A/E training, and pt. Education about energy conservation, work simplification, home modification, and DME. Pt. would benefitt from SNF level of care upon discharge with follow-up OT services.   Follow Up Recommendations  SNF, if returns home rec. HHOT, and a home care aide.   Equipment Recommendations       Recommendations for Other Services      Precautions / Restrictions Precautions Precautions: Fall Precaution Comments: bilateral nephrostomy tubes, colostomy; abdominal drain Restrictions Weight Bearing Restrictions: No       Mobility Bed Mobility Overal bed mobility: Needs Assistance Bed Mobility: Supine to  Sit;Sit to Supine     Supine to sit: Max assist Sit to supine: Max assist   General bed mobility comments: Pt. required makA to align trunk  Transfers                 General transfer comment: Deferred. Pt. is unable.    Balance Overall balance assessment: Needs assistance   Sitting balance-Leahy Scale: Poor                                     ADL either performed or assessed with clinical judgement   ADL Overall ADL's : Needs assistance/impaired Eating/Feeding: Set up;Bed level   Grooming: Brushing hair;Set up;Bed level   Upper Body Bathing: Set up;Moderate assistance   Lower Body Bathing: Set up;Maximal assistance   Upper Body Dressing : Set up;Moderate assistance   Lower Body Dressing: Set up;Maximal assistance   Toilet Transfer: Total assistance                   Vision Baseline Vision/History: No visual deficits Patient Visual Report: No change from baseline     Perception     Praxis      Cognition Arousal/Alertness: Awake/alert Behavior During Therapy: WFL for tasks assessed/performed Overall Cognitive Status: Within Functional Limits for tasks assessed                                          Exercises  Shoulder Instructions       General Comments      Pertinent Vitals/ Pain       Pain Assessment: No/denies pain  Home Living                                          Prior Functioning/Environment              Frequency  Min 2X/week        Progress Toward Goals  OT Goals(current goals can now be found in the care plan section)  Progress towards OT goals: OT to reassess next treatment  Acute Rehab OT Goals Patient Stated Goal: Pt. be able to do more by himself OT Goal Formulation: With patient Potential to Achieve Goals: Good  Plan Discharge plan remains appropriate;Frequency remains appropriate    Co-evaluation                 AM-PAC OT "6 Clicks"  Daily Activity     Outcome Measure   Help from another person eating meals?: A Little Help from another person taking care of personal grooming?: A Little Help from another person toileting, which includes using toliet, bedpan, or urinal?: A Lot Help from another person bathing (including washing, rinsing, drying)?: A Lot Help from another person to put on and taking off regular upper body clothing?: A Lot Help from another person to put on and taking off regular lower body clothing?: A Lot 6 Click Score: 14    End of Session    OT Visit Diagnosis: Muscle weakness (generalized) (M62.81)   Activity Tolerance Patient limited by fatigue   Patient Left in bed;with call bell/phone within reach;with bed alarm set;with family/visitor present   Nurse Communication          Time: 3825-0539 OT Time Calculation (min): 22 min  Charges: OT General Charges $OT Visit: 1 Visit OT Treatments $Self Care/Home Management : 8-22 mins  Harrel Carina, MS, OTR/L  Harrel Carina 10/21/2019, 9:45 AM

## 2019-10-22 ENCOUNTER — Ambulatory Visit
Admission: RE | Admit: 2019-10-22 | Discharge: 2019-10-22 | Disposition: A | Discharge status: Home / Self Care | Payer: Medicare Other | Source: Ambulatory Visit | Attending: Radiation Oncology | Admitting: Radiation Oncology

## 2019-10-22 LAB — BASIC METABOLIC PANEL
Anion gap: 12 (ref 5–15)
BUN: 18 mg/dL (ref 8–23)
CO2: 28 mmol/L (ref 22–32)
Calcium: 8.6 mg/dL — ABNORMAL LOW (ref 8.9–10.3)
Chloride: 98 mmol/L (ref 98–111)
Creatinine, Ser: 0.69 mg/dL (ref 0.61–1.24)
GFR calc Af Amer: >60 mL/min (ref >60)
GFR calc non Af Amer: >60 mL/min (ref >60)
Glucose, Bld: 110 mg/dL — ABNORMAL HIGH (ref 70–99)
Potassium: 3.9 mmol/L (ref 3.5–5.1)
Sodium: 138 mmol/L (ref 135–145)

## 2019-10-22 LAB — MAGNESIUM: Magnesium: 1.9 mg/dL (ref 1.7–2.4)

## 2019-10-22 MED ORDER — ACETAMINOPHEN 325 MG PO TABS
650.0000 mg | ORAL_TABLET | Freq: Four times a day (QID) | ORAL | Status: DC | PRN
  Administered 2019-10-22 – 2019-10-23 (×2): 650 mg via ORAL
  Filled 2019-10-22 (×2): qty 2

## 2019-10-22 NOTE — Progress Notes (Signed)
Progress Note    KRISTAN VOTTA  SNK:[id number] DOB: 22-Dec-1934  DOA: 10/05/2019 PCP: [name], MD      Brief Narrative:    Medical records reviewed and are as summarized below:  JERAD DUNLAP is an 84 y.o. male with medical history significant forlocally-metastatic bladder cancer on chemotherapy, urinary tract obstruction with b/l nephrostomy tubes in place, recurrent UTI, hypertension, colostomy in place, provoked dvt s/p anticoagulation, who presents with a red "lump" on his lower abdomen in the suprapubic region for several months.  This was noted at recent hospitalization, thought to be secondary to metastatic bladder cancer.  He had significant leukocytosis on admission and patient was treated for sepsis secondary to intra-abdominal abscess communicating with the bladder.  He was treated with IV antibiotics 12 days.  ID was consulted for guidance on duration of antibiotics.  Dr. Steva Ready, [place] specialist recommended that antibiotics be discontinued.  He has been receiving radiation therapy while in the hospital.  His prognosis is poor.  He was seen by his oncologist in the hospital who said that patient is not a candidate for any additional chemotherapy.  Patient was seen by the palliative care team.  However, his wife said they are not ready for hospice and they will continue with radiation therapy.  He was evaluated by PT and OT who recommended further rehabilitation at the skilled nursing facility.    Assessment/Plan:   Principal Problem:   Intra-abdominal infection Active Problems:   Sepsis ([place])   Essential hypertension   Prostate cancer (HCC)   Urinary obstruction   Malignant neoplasm of urinary bladder (HCC)   Pressure injury of skin   S/P partial resection of colon   Intra-abdominal abscess (HCC)   Body mass index is 20.48 kg/m.    Sepsis secondary to abdominal wall abscess/significant leukocytosis: He has completed IV antibiotics.  Sinus  tachycardia: This is intermittent and asymptomatic.  This is likely due to underlying abdominal wall abscess/malignancy.   Metastatic bladder cancer:  Prognosis is poor but patient and his wife still want to continue radiation therapy.  They are not ready for hospice.    Acute on chronic leukocytosis: Likely worsened by infection  Hypokalemia and hypomagnesemia: Improved.   Bilateral nephrostomy tubes: Continue nephrostomy care per nursing.  Stage I sacral decubitus ulcer: This was present on admission.  Continue local wound care.   Family Communication/Anticipated D/C date and plan/Code Status   DVT prophylaxis: SCDs, Lovenox Code Status: DNR Family Communication: None Disposition Plan: Patient is stable for discharge but is awaiting placement to SNF     Subjective:   No new complaints   Objective:    Vitals:   10/21/19 2028 10/21/19 2029 10/22/19 0620 10/22/19 1323  BP: 113/67  109/63 114/60  Pulse: (!) 110 87 (!) 115 (!) 118  Resp: 18  18   Temp: 98.3 F (36.8 C)  98.9 F (37.2 C) 98.2 F (36.8 C)  TempSrc:      SpO2: 94% 94% 93% 94%  Weight:      Height:        Intake/Output Summary (Last 24 hours) at 10/22/2019 1446 Last data filed at 10/22/2019 1300 Gross per 24 hour  Intake 720 ml  Output 890 ml  Net -170 ml   Filed Weights   10/05/19 1418 10/05/19 2139  Weight: 65.8 kg 66.6 kg    Exam:  GEN: NAD SKIN: Stage I sacral decubitus ulcer EYES: anicteric ENT: MMM CV: Tachycardic PULM: Air  entry adequate bilaterally, no wheezing or rales heard ABD: soft, ND, suprapubic tenderness, +BS, +colostomy, lower abdominal drain in place CNS: AAO x 3, non focal EXT: No edema or tenderness GU: B/l nephrostomy tubes in place, draining amber urine   Data Reviewed:   I have personally reviewed following labs and imaging studies:  Labs: Labs show the following:   Basic Metabolic Panel: Recent Labs  Lab 10/16/19 0357 10/17/19 0326 10/18/19 1010  10/19/19 0348 10/22/19 0420  NA 142 139 140 138 138  K 4.6 3.5 3.7 3.7 3.9  CL 110 108 106 104 98  CO2 23 22 25 26 28   GLUCOSE 91 86 110* 102* 110*  BUN 11 11 14 15 18   CREATININE 0.98 0.72 0.79 0.71 0.69  CALCIUM 8.5* 8.2* 8.3* 8.0* 8.6*  MG 2.2 1.8 1.8 1.7 1.9   GFR Estimated Creatinine Clearance: 67.1 mL/min (by C-G formula based on SCr of 0.69 mg/dL). Liver Function Tests: No results for input(s): AST, ALT, ALKPHOS, BILITOT, PROT, ALBUMIN in the last 168 hours. No results for input(s): LIPASE, AMYLASE in the last 168 hours. No results for input(s): AMMONIA in the last 168 hours. Coagulation profile No results for input(s): INR, PROTIME in the last 168 hours.  CBC: Recent Labs  Lab 10/16/19 1244 10/17/19 0326 10/18/19 0539 10/19/19 0348  WBC 71.0* 64.4* 66.9* 70.8*  NEUTROABS  --  54.2* 56.9* 60.2*  HGB 9.9* 9.9* 10.1* 9.8*  HCT 31.2* 30.5* 31.2* 30.5*  MCV 90.4 87.4 88.9 90.5  PLT 390 383 394 393   Cardiac Enzymes: No results for input(s): CKTOTAL, CKMB, CKMBINDEX, TROPONINI in the last 168 hours. BNP (last 3 results) No results for input(s): PROBNP in the last 8760 hours. CBG: No results for input(s): GLUCAP in the last 168 hours. D-Dimer: No results for input(s): DDIMER in the last 72 hours. Hgb A1c: No results for input(s): HGBA1C in the last 72 hours. Lipid Profile: No results for input(s): CHOL, HDL, LDLCALC, TRIG, CHOLHDL, LDLDIRECT in the last 72 hours. Thyroid function studies: No results for input(s): TSH, T4TOTAL, T3FREE, THYROIDAB in the last 72 hours.  Invalid input(s): FREET3 Anemia work up: No results for input(s): VITAMINB12, FOLATE, FERRITIN, TIBC, IRON, RETICCTPCT in the last 72 hours. Sepsis Labs: Recent Labs  Lab 10/16/19 1244 10/17/19 0326 10/18/19 0539 10/19/19 0348  WBC 71.0* 64.4* 66.9* 70.8*    Microbiology No results found for this or any previous visit (from the past 240 hour(s)).  Procedures and diagnostic  studies:  No results found.  Medications:   . Chlorhexidine Gluconate Cloth  6 each Topical Daily  . enoxaparin (LOVENOX) injection  40 mg Subcutaneous Q24H  . feeding supplement (ENSURE ENLIVE)  237 mL Oral TID BM  . mirabegron ER  50 mg Oral Daily  . multivitamin with minerals  1 tablet Oral Daily  . sodium chloride flush  5 mL Intravenous Q8H   Continuous Infusions: . sodium chloride 500 mL (10/19/19 1044)     LOS: 17 days   Ridhaan Dreibelbis  [facility]   *Please refer to [email or web address], password [email or web address] to get updated schedule on who will round on this patient, as hospitalists switch teams weekly. If 7PM-7AM, please contact night-coverage at [email or web address], password [email or web address] for any overnight needs.  10/22/2019, 2:46 PM

## 2019-10-22 NOTE — Progress Notes (Signed)
Physical Therapy Treatment Patient Details Name: Vincent Poole MRN: [id number] DOB: 1935/04/28 Today's Date: 10/22/2019    History of Present Illness Pt. is an 84 y.o. male with medical history significant for locally-metastatic bladder cancer on chemotherapy, urinary tract obstruction with b/l nephrostomy tubes in place, recurrent UTI, htn, colostomy in place. Pt with PMH of GERD, anxiety, arthritis, history of atrial flutter, R femur fx, HTN, prostate cancer, umbilical hernia, wound eschar of L foot, multiple bladder surgeries/procedures. Pt presents to ED with a red "lump" on his lower abdomen in the suprapubic region for several months, has open/draining; noted at recent hospitalization thought to be 2/2 metastatic bladder cancer. Pt given broad spectrum abx and CT abd ordered.    PT Comments    "I'll try."  Rolling left and right with rain and min assist.  Once side lying, he is unable to get his trunk up off of the bed or bring legs off.  Max a x 1 to sit upright but once up he is able to sit unsupported x 6 minutes with supervision.  Fatigue and balance limit exercises in sitting so they were deferred to allow for increased time upright.  Returned to supine with max a x 1 and repositioned for comfort.     Follow Up Recommendations  SNF     Equipment Recommendations  None recommended by PT    Recommendations for Other Services       Precautions / Restrictions Precautions Precautions: Fall Precaution Comments: bilateral nephrostomy tubes, colostomy; abdominal drain Restrictions Weight Bearing Restrictions: No    Mobility  Bed Mobility Overal bed mobility: Needs Assistance Bed Mobility: Rolling;Sit to Supine;Sidelying to Sit Rolling: Min assist Sidelying to sit: Max assist   Sit to supine: Max assist   General bed mobility comments: does well rolling with rail but unable to lift trunk up off bed without max a.  Transfers                 General transfer  comment: Deferred. Pt. is unable.  Ambulation/Gait             General Gait Details: Unable   Stairs             Wheelchair Mobility    Modified Rankin (Stroke Patients Only)       Balance Overall balance assessment: Needs assistance Sitting-balance support: Feet supported Sitting balance-Leahy Scale: Fair Sitting balance - Comments: able to sit unsupported today x 6 minutes with supervision.  no dizziness/nausea noted.                                    Cognition Arousal/Alertness: Awake/alert Behavior During Therapy: WFL for tasks assessed/performed Overall Cognitive Status: Within Functional Limits for tasks assessed                                        Exercises Other Exercises Other Exercises: sitting EOB x  6 minutes.  able to complete ankle pumps but while trying LAQ he had difficulty with balance and was deferred to maintian strength and endurance to allow for longer sitting time.    General Comments        Pertinent Vitals/Pain Pain Assessment: No/denies pain    Home Living  Prior Function            PT Goals (current goals can now be found in the care plan section) Progress towards PT goals: Progressing toward goals    Frequency    Min 2X/week      PT Plan Current plan remains appropriate    Co-evaluation              AM-PAC PT "6 Clicks" Mobility   Outcome Measure  Help needed turning from your back to your side while in a flat bed without using bedrails?: A Lot Help needed moving from lying on your back to sitting on the side of a flat bed without using bedrails?: A Lot Help needed moving to and from a bed to a chair (including a wheelchair)?: Total Help needed standing up from a chair using your arms (e.g., wheelchair or bedside chair)?: Total Help needed to walk in hospital room?: Total Help needed climbing 3-5 steps with a railing? : Total 6 Click Score:  8    End of Session   Activity Tolerance: Patient tolerated treatment well;Patient limited by fatigue Patient left: in bed;with bed alarm set;with call bell/phone within reach Nurse Communication: Mobility status       Time: 0110-0349 PT Time Calculation (min) (ACUTE ONLY): 12 min  Charges:  $Therapeutic Activity: 8-22 mins                    Chesley Noon, PTA 10/22/19, 9:56 AM

## 2019-10-22 NOTE — Consult Note (Signed)
[place] for Electrolyte Monitoring and Replacement   Recent Labs: Potassium (mmol/L)  Date Value  10/22/2019 3.9   Magnesium (mg/dL)  Date Value  10/22/2019 1.9   Calcium (mg/dL)  Date Value  10/22/2019 8.6 (L)   Albumin (g/dL)  Date Value  10/06/2019 2.4 (L)   Phosphorus (mg/dL)  Date Value  10/10/2019 2.5   Sodium (mmol/L)  Date Value  10/22/2019 138   Corrected Ca: 9.9 mg/dL  Assessment: 84 y.o.malewith medical history significant forlocally-metastatic bladder cancer on chemotherapy, urinary tract obstruction with b/l nephrostomy tubes in place, recurrent UTI, htn, colostomy in place, provoked dvt s/p anticoagulation, who presents with an abscess with sepsis - secondary to locally metastatic bladder cancerconcerning forerosionthrough the anterior abdominal wall.  Goal of Therapy:  Electrolytes WNL  Plan:   No electrolyte replacement indicated.   Electrolytes have been stable: defer f/u until 12/16   Pharmacy will continue to follow along.  Dallie Piles ,PharmD Clinical Pharmacist 10/22/2019 8:11 AM

## 2019-10-22 NOTE — TOC Progression Note (Signed)
Transition of Care [facility]) - Progression Note    Patient Details  Name: Vincent Poole MRN: [id number] Date of Birth: [birth year]  Transition of Care [facility]) CM/SW [place], [place] Phone Number: 10/22/2019, 4:13 PM  Clinical Narrative:     CSW contacted pt's wife, Jarmar Rousseau, to let her know that the patient was accepted to [facility] in [place]. Pt's wife asked if he would still be able to go to his radiology appointments at the [facility] through the 22nd of December. CSW contacted Surveyor, minerals at [facility] in [place]. He stated that he would get clearance to be able to get the patient to the appointments. Doug asked for the pt's wife's number so he can reach out to her.   Awaiting word back from [place]. Possible admission on 12/16  Expected Discharge Plan: Home/Self Care Barriers to Discharge: Continued Medical Work up  Expected Discharge Plan and Services Expected Discharge Plan: Home/Self Care       Living arrangements for the past 2 months: Single Family Home                                       Social Determinants of Health (SDOH) Interventions    Readmission Risk Interventions Readmission Risk Prevention Plan 10/10/2019 09/27/2019 04/28/2019  Transportation Screening Complete Complete Complete  PCP or Specialist Appt within 3-5 Days - - Not Complete  HRI or [place] - - Complete  Social Work Consult for [place] Planning/Counseling - - Patient refused  Palliative Care Screening - - -  Medication Review Press photographer) Complete Complete Complete  PCP or Specialist appointment within 3-5 days of discharge - Complete -  [place] or [place] Patient refused (No Data) -  SW Recovery Care/Counseling Consult Complete Complete -  Palliative Care Screening - Not Applicable -  [place] - Not Applicable -  Some recent data might be hidden

## 2019-10-23 ENCOUNTER — Ambulatory Visit
Admission: RE | Admit: 2019-10-23 | Discharge: 2019-10-23 | Disposition: A | Discharge status: Home / Self Care | Payer: Medicare Other | Source: Ambulatory Visit | Attending: Radiation Oncology | Admitting: Radiation Oncology

## 2019-10-23 NOTE — Progress Notes (Signed)
Nutrition Follow Up Note   DOCUMENTATION CODES:   Not applicable  INTERVENTION:   Ensure Enlive po TID, each supplement provides 350 kcal and 20 grams of protein  Magic cup TID with meals, each supplement provides 290 kcal and 9 grams of protein  MVI daily   NUTRITION DIAGNOSIS:   Increased nutrient needs related to cancer and cancer related treatments as evidenced by increased estimated needs.  GOAL:   Patient will meet greater than or equal to 90% of their needs  -progressing   MONITOR:   PO intake, Supplement acceptance, Labs, Weight trends, Skin, I & O's  ASSESSMENT:   84 y.o. male  with medical history significant for locally-metastatic bladder cancer on chemotherapy, urinary tract obstruction with b/l nephrostomy tubes in place, recurrent UTI, hypertension, colostomy in place, provoked dvt s/p anticoagulation, who presents with a red "lump" on his lower abdomen in the suprapubic region for several months. Pt with abdominal wall abscess  Pt continues to have fair appetite and oral intake in hospital; pt eating 50-100% of meals and drinking some Ensure supplements. Recommend continue supplements and MVI after discharge. No new weight since admit; will request weekly weights. Plan is for SNF after discharge.   Medications reviewed and include: lovenox, MVI  Labs reviewed:   Diet Order:   Diet Order            Diet regular Room service appropriate? Yes; Fluid consistency: Thin  Diet effective now             EDUCATION NEEDS:   No education needs have been identified at this time  Skin:  Skin Assessment: Reviewed RN Assessment(Stage 1 sacrum, wound abdomen)  Last BM:  12/16- type 6 via ostomy  Height:   Ht Readings from Last 1 Encounters:  10/05/19 5\' 11"  (1.803 m)    Weight:   Wt Readings from Last 1 Encounters:  10/05/19 66.6 kg    Ideal Body Weight:  78 kg  BMI:  Body mass index is 20.48 kg/m.  Estimated Nutritional Needs:   Kcal:   1800-2100kcal/day  Protein:  90-105g/day  Fluid:  >1.7L/day  Vincent Distance MS, RD, LDN Pager #- [phone] Office#- [phone] After Hours Pager: [phone]

## 2019-10-23 NOTE — Progress Notes (Addendum)
MEWS/VS Documentation      10/22/2019 0620 10/22/2019 1323 10/22/2019 2018 10/22/2019 2147   MEWS Score:  2  2  2  3    MEWS Score Color:  Yellow  Yellow  Yellow  Yellow   Resp:  18  --  20  18   Pulse:  (!) 115  (!) 118  (!) 125  (!) 128   BP:  109/63  114/60  (!) 119/57  114/66   Temp:  98.9 F (37.2 C)  98.2 F (36.8 C)  98.3 F (36.8 C)  (!) 100.7 F (38.2 C)   O2 Device:  Room [employer]      On-call NP notified. She placed orders for Tylenol.

## 2019-10-23 NOTE — TOC Progression Note (Signed)
Transition of Care [facility]) - Progression Note    Patient Details  Name: NASIER THUMM MRN: [id number] Date of Birth: [birth year]  Transition of Care [facility]) CM/SW [place], [place] Phone Number: 10/23/2019, 10:30 AM  Clinical Narrative:     CSW contacted Doug at the [facility] in [place], [place]. Doug asked for the CSW to obtain information about how often the patient will need to be transported to [facility] for radiology. CSW contacted pt's wife, [name]. Pt's wife stated that he needs to go every day until December 22nd.  CSW informed Marden Noble who stated that he will need to get special clearance but it should work.  CSW called [place] to obtain auth.  Pending Auth: [id number].  CSW faxed over all clinical information to [facility].   CSW asked pt's doctor if the patient can discharge today. Pt's doctor stated that the patient has a low-grade fever and he wants to observe him. He stated that tomorrow is a better option.  CSW informed Marden Noble at [facility] in [place].   Expected Discharge Plan: Home/Self Care Barriers to Discharge: Continued Medical Work up  Expected Discharge Plan and Services Expected Discharge Plan: Home/Self Care       Living arrangements for the past 2 months: Single Family Home                                       Social Determinants of Health (SDOH) Interventions    Readmission Risk Interventions Readmission Risk Prevention Plan 10/10/2019 09/27/2019 04/28/2019  Transportation Screening Complete Complete Complete  PCP or Specialist Appt within 3-5 Days - - Not Complete  HRI or [place] - - Complete  Social Work Consult for [place] Planning/Counseling - - Patient refused  Palliative Care Screening - - -  Medication Review Press photographer) Complete Complete Complete  PCP or Specialist appointment within 3-5 days of discharge - Complete -  [place] or [place] Patient refused (No Data) -  SW Recovery  Care/Counseling Consult Complete Complete -  Palliative Care Screening - Not Applicable -  [place] - Not Applicable -  Some recent data might be hidden

## 2019-10-23 NOTE — Progress Notes (Signed)
PROGRESS NOTE    Vincent Poole  MPN:[id number] DOB: Nov 08, 1934 DOA: 10/05/2019 PCP: [name], MD   Brief Narrative:  Patient is an 84 year old male with medical history of locally metastatic invasive bladder carcinoma currently on radiation therapy, progressive disease despite multiple lines of treatment.  Hospitalized since 10/05/2019 for management of area of abdominal wall abscess with associated fistulous tract with the latter.  Patient is completed a full course of broad-spectrum IV antibiotics.  Infectious disease consulted at that time who with recommendations for antibiotic discontinuation.  The patient has been receiving radiation therapy with the hospital.  Palliative care was consulted earlier in the admission and despite their efforts the family is unwilling or unable to agree to any de-escalation of care at this time.  12/16: Fever noted over interval.  T-max 100.7 on 10/22/2019 at 2101.  No antibiotics.   Assessment & Plan:   Principal Problem:   Intra-abdominal infection Active Problems:   Sepsis ([place])   Essential hypertension   Prostate cancer ([place])   Urinary obstruction   Malignant neoplasm of urinary bladder (HCC)   Pressure injury of skin   S/P partial resection of colon   Intra-abdominal abscess (HCC)  Fever: Unexplained etiology Resolve spontaneously without initiation of antibiotic We will draw repeat blood cultures Monitor over next 24 hours If patient remains afebrile and cultures remain negative can plan on discharge to skilled nursing facility within the next 24  Sepsis secondary to abdominal wall abscess/significant leukocytosis:  He has completed IV antibiotics.  Sinus tachycardia: This is intermittent and asymptomatic.  This is likely due to underlying abdominal wall abscess/malignancy.   Metastatic bladder cancer:  Prognosis is poor but patient and his wife still want to continue radiation therapy.  They are not ready for hospice.      Acute on chronic leukocytosis: Likely worsened by infection  Hypokalemia and hypomagnesemia: Improved.   Bilateral nephrostomy tubes: Continue nephrostomy care per nursing.  Stage I sacral decubitus ulcer: This was present on admission.  Continue local wound care.  DVT prophylaxis: Lovenox Code Status: DNR Family Communication: spoke to wife at bedside 12/16 Disposition Plan: SNF, anticipate discharge 12/17   Consultants:   Pallative care  Procedures:   none  Antimicrobials: (specify start and planned stop date. Auto populated tables are space occupying and do not give end dates)  None currently   Subjective: Seen and examined No acute events overnight Overall appears in good spirits Wife present at bedside  Objective: Vitals:   10/23/19 0326 10/23/19 0550 10/23/19 1018 10/23/19 1403  BP: (!) 104/57 (!) 110/59  115/67  Pulse: (!) 110 (!) 112  (!) 126  Resp: 18 18  20   Temp: 97.7 F (36.5 C) 98.1 F (36.7 C)  98 F (36.7 C)  TempSrc: Oral   Oral  SpO2: 94% 94%  95%  Weight:   67.2 kg   Height:        Intake/Output Summary (Last 24 hours) at 10/23/2019 1513 Last data filed at 10/23/2019 1233 Gross per 24 hour  Intake 240 ml  Output 2010 ml  Net -1770 ml   Filed Weights   10/05/19 1418 10/05/19 2139 10/23/19 1018  Weight: 65.8 kg 66.6 kg 67.2 kg    Examination:  General exam:Appears calm and comfortable  Respiratory system: Clear to auscultation. Respiratory effort normal. Cardiovascular system:S1 &S2 heard, RRR. No JVD, murmurs, rubs, gallops or clicks. No pedal edema. Gastrointestinal system:Abdomenwith colostomy bag no output no signs of infection,post I&D wound on  abdominal wall no evidence of bleeding or exuding purulence through dressing at this point time, did not take dressing down Central nervous system:Alert, Extremities:Warm well perfused  skin:Lesions abdominal wall as above otherwise no rashes, lesions or  ulcers Psychiatry:Judgement and insightare poor. Mood & affectflat    Data Reviewed: I have personally reviewed following labs and imaging studies  CBC: Recent Labs  Lab 10/17/19 0326 10/18/19 0539 10/19/19 0348  WBC 64.4* 66.9* 70.8*  NEUTROABS 54.2* 56.9* 60.2*  HGB 9.9* 10.1* 9.8*  HCT 30.5* 31.2* 30.5*  MCV 87.4 88.9 90.5  PLT 383 394 400   Basic Metabolic Panel: Recent Labs  Lab 10/17/19 0326 10/18/19 1010 10/19/19 0348 10/22/19 0420  NA 139 140 138 138  K 3.5 3.7 3.7 3.9  CL 108 106 104 98  CO2 22 25 26 28   GLUCOSE 86 110* 102* 110*  BUN 11 14 15 18   CREATININE 0.72 0.79 0.71 0.69  CALCIUM 8.2* 8.3* 8.0* 8.6*  MG 1.8 1.8 1.7 1.9   GFR: Estimated Creatinine Clearance: 67.7 mL/min (by C-G formula based on SCr of 0.69 mg/dL). Liver Function Tests: No results for input(s): AST, ALT, ALKPHOS, BILITOT, PROT, ALBUMIN in the last 168 hours. No results for input(s): LIPASE, AMYLASE in the last 168 hours. No results for input(s): AMMONIA in the last 168 hours. Coagulation Profile: No results for input(s): INR, PROTIME in the last 168 hours. Cardiac Enzymes: No results for input(s): CKTOTAL, CKMB, CKMBINDEX, TROPONINI in the last 168 hours. BNP (last 3 results) No results for input(s): PROBNP in the last 8760 hours. HbA1C: No results for input(s): HGBA1C in the last 72 hours. CBG: No results for input(s): GLUCAP in the last 168 hours. Lipid Profile: No results for input(s): CHOL, HDL, LDLCALC, TRIG, CHOLHDL, LDLDIRECT in the last 72 hours. Thyroid Function Tests: No results for input(s): TSH, T4TOTAL, FREET4, T3FREE, THYROIDAB in the last 72 hours. Anemia Panel: No results for input(s): VITAMINB12, FOLATE, FERRITIN, TIBC, IRON, RETICCTPCT in the last 72 hours. Sepsis Labs: No results for input(s): PROCALCITON, LATICACIDVEN in the last 168 hours.  No results found for this or any previous visit (from the past 240 hour(s)).       Radiology Studies: No  results found.      Scheduled Meds: . Chlorhexidine Gluconate Cloth  6 each Topical Daily  . enoxaparin (LOVENOX) injection  40 mg Subcutaneous Q24H  . feeding supplement (ENSURE ENLIVE)  237 mL Oral TID BM  . mirabegron ER  50 mg Oral Daily  . multivitamin with minerals  1 tablet Oral Daily  . sodium chloride flush  5 mL Intravenous Q8H   Continuous Infusions: . sodium chloride 500 mL (10/19/19 1044)     LOS: 18 days    Time spent: 35 minutes    Sidney Ace, MD [facility] Pager [phone]  If 7PM-7AM, please contact night-coverage [email or web address] Password [email or web address] 10/23/2019, 3:13 PM

## 2019-10-23 NOTE — Progress Notes (Signed)
PT Cancellation Note  Patient Details Name: Vincent Poole MRN: [id number] DOB: 11-27-1934   Cancelled Treatment:    Reason Eval/Treat Not Completed: Other (comment). Pt with elevated HR at 126bpm and has had elevated resting HR sustained this date. Not appropriate to attempt OOB mobility at this time. Will re-attempt.   Kelbi Renstrom 10/23/2019, 3:22 PM Greggory Stallion, PT, DPT [phone]

## 2019-10-24 ENCOUNTER — Ambulatory Visit
Admission: RE | Admit: 2019-10-24 | Discharge: 2019-10-24 | Disposition: A | Discharge status: Home / Self Care | Payer: Medicare Other | Source: Ambulatory Visit | Attending: Radiation Oncology | Admitting: Radiation Oncology

## 2019-10-24 LAB — BASIC METABOLIC PANEL
Anion gap: 13 (ref 5–15)
BUN: 22 mg/dL (ref 8–23)
CO2: 26 mmol/L (ref 22–32)
Calcium: 8.6 mg/dL — ABNORMAL LOW (ref 8.9–10.3)
Chloride: 99 mmol/L (ref 98–111)
Creatinine, Ser: 0.8 mg/dL (ref 0.61–1.24)
GFR calc Af Amer: >60 mL/min (ref >60)
GFR calc non Af Amer: >60 mL/min (ref >60)
Glucose, Bld: 104 mg/dL — ABNORMAL HIGH (ref 70–99)
Potassium: 3.9 mmol/L (ref 3.5–5.1)
Sodium: 138 mmol/L (ref 135–145)

## 2019-10-24 LAB — MAGNESIUM: Magnesium: 1.9 mg/dL (ref 1.7–2.4)

## 2019-10-24 NOTE — TOC Progression Note (Signed)
Transition of Care [facility]) - Progression Note    Patient Details  Name: Vincent Poole MRN: [id number] Date of Birth: 09-10-1935  Transition of Care [facility]) CM/SW [place], [place] Phone Number: 10/24/2019, 3:36 PM  Clinical Narrative:      CSW received a phone call from [place] [phone] with [facility]. Susie stated that the medical director wants to do a peer to peer with the current provider and needs word by 12:30pm.  CSW asked the current provider, Ralene Muskrat, to do the call.  CSW received a call from [place] with [facility] that the peer to peer was declined and that insurance would not be able to approve for [facility].  Fast Track Repeal number for family/patient is: [phone].   Supervisor at [facility], Pete Pelt, stated that a conversation with the pt's family needs to be had because the patient might need to go home and appear from home since he was declined.    Expected Discharge Plan: Home/Self Care Barriers to Discharge: Continued Medical Work up  Expected Discharge Plan and Services Expected Discharge Plan: Home/Self Care       Living arrangements for the past 2 months: Single Family Home                                       Social Determinants of Health (SDOH) Interventions    Readmission Risk Interventions Readmission Risk Prevention Plan 10/10/2019 09/27/2019 04/28/2019  Transportation Screening Complete Complete Complete  PCP or Specialist Appt within 3-5 Days - - Not Complete  [place] or [place] - - Complete  Social Work Consult for [place] Planning/Counseling - - Patient refused  Palliative Care Screening - - -  Medication Review Press photographer) Complete Complete Complete  PCP or Specialist appointment within 3-5 days of discharge - Complete -  [place] or [place] Patient refused (No Data) -  SW Recovery Care/Counseling Consult Complete Complete -  Palliative Care  Screening - Not Applicable -  [place] - Not Applicable -  Some recent data might be hidden

## 2019-10-24 NOTE — Progress Notes (Signed)
PROGRESS NOTE    Vincent Poole  UGQ:[id number] DOB: 1934/11/18 DOA: 10/05/2019 PCP: [name], MD   Brief Narrative:  Patient is an 84 year old male with medical history of locally metastatic invasive bladder carcinoma currently on radiation therapy, progressive disease despite multiple lines of treatment.  Hospitalized since 10/05/2019 for management of area of abdominal wall abscess with associated fistulous tract with the latter.  Patient is completed a full course of broad-spectrum IV antibiotics.  Infectious disease consulted at that time who with recommendations for antibiotic discontinuation.  The patient has been receiving radiation therapy with the hospital.  Palliative care was consulted earlier in the admission and despite their efforts the family is unwilling or unable to agree to any de-escalation of care at this time.  12/16: Fever noted over interval.  T-max 100.7 on 10/22/2019 at 2101.  No antibiotics.  12/17: No fevers.  Patient in some pain otherwise no interval changes   Assessment & Plan:   Principal Problem:   Intra-abdominal infection Active Problems:   Sepsis ([place])   Essential hypertension   Prostate cancer (HCC)   Urinary obstruction   Malignant neoplasm of urinary bladder (HCC)   Pressure injury of skin   S/P partial resection of colon   Intra-abdominal abscess (HCC)  Fever:- resolved Unexplained etiology Resolve spontaneously without initiation of antibiotic Repeat blood cultures negative to date No antibiotics indicated  Sepsis secondary to abdominal wall abscess/significant leukocytosis:  He has completed IV antibiotics.  No further antibiotics at this time  Sinus tachycardia: This is intermittent and asymptomatic.  This is likely due to underlying abdominal wall abscess/malignancy.   Metastatic bladder cancer:  Prognosis is poor but patient and his wife still want to continue radiation therapy.  They are not ready for hospice.     Acute on chronic leukocytosis: Likely worsened by infection  Hypokalemia and hypomagnesemia: Improved.   Bilateral nephrostomy tubes: Continue nephrostomy care per nursing.  Stage I sacral decubitus ulcer: This was present on admission.  Continue local wound care.  Functional decline: Patient has had a very extensive and complicated disease process since diagnosis of bladder cancer in 2018.  He currently has bilateral nephrostomy tubes, diverting colostomy, ostomy over abdominal wall abscess, bedbound.  Working currently with transition of care team to secure a safe disposition for patient.  Initial concern the patient may not be able to participate in intensive skilled nursing however his complex medical needs exceed what the family may be willing to care for at home.  I will continue to work to secure a safe and appropriate disposition for this patient.  DVT prophylaxis: Lovenox Code Status: DNR Family Communication: spoke to wife at bedside 12/16 Disposition Plan: SNF, anticipate discharge 12/17   Consultants:   Pallative care  Procedures:   none  Antimicrobials:  None currently   Subjective: Seen and examined No acute events overnight In some pain but answers all questions appropriately  Objective: Vitals:   10/23/19 2135 10/24/19 0016 10/24/19 0521 10/24/19 0600  BP:  102/67 (!) 120/56   Pulse: (!) 114 (!) 107 (!) 127 (!) 116  Resp:  20 20   Temp:  98 F (36.7 C) 98.4 F (36.9 C)   TempSrc:      SpO2:  96% 96%   Weight:      Height:        Intake/Output Summary (Last 24 hours) at 10/24/2019 1341 Last data filed at 10/24/2019 1042 Gross per 24 hour  Intake 490 ml  Output  730 ml  Net -240 ml   Filed Weights   10/05/19 1418 10/05/19 2139 10/23/19 1018  Weight: 65.8 kg 66.6 kg 67.2 kg    Examination:  General exam:Appears calm and comfortable  Respiratory system: Clear to auscultation. Respiratory effort normal. Cardiovascular system:S1 &S2  heard, RRR. No JVD, murmurs, rubs, gallops or clicks. No pedal edema. Gastrointestinal system:Abdomenwith colostomy bag no output no signs of infection,post I&D wound on abdominal wall no evidence of bleeding or exuding purulence through dressing at this point time, did not take dressing down Central nervous system:Alert, Extremities:Warm well perfused  skin:Lesions abdominal wall as above otherwise no rashes, lesions or ulcers Psychiatry:Judgement and insightare poor. Mood & affectflat    Data Reviewed: I have personally reviewed following labs and imaging studies  CBC: Recent Labs  Lab 10/18/19 0539 10/19/19 0348  WBC 66.9* 70.8*  NEUTROABS 56.9* 60.2*  HGB 10.1* 9.8*  HCT 31.2* 30.5*  MCV 88.9 90.5  PLT 394 967   Basic Metabolic Panel: Recent Labs  Lab 10/18/19 1010 10/19/19 0348 10/22/19 0420 10/24/19 0527  NA 140 138 138 138  K 3.7 3.7 3.9 3.9  CL 106 104 98 99  CO2 25 26 28 26   GLUCOSE 110* 102* 110* 104*  BUN 14 15 18 22   CREATININE 0.79 0.71 0.69 0.80  CALCIUM 8.3* 8.0* 8.6* 8.6*  MG 1.8 1.7 1.9 1.9   GFR: Estimated Creatinine Clearance: 67.7 mL/min (by C-G formula based on SCr of 0.8 mg/dL). Liver Function Tests: No results for input(s): AST, ALT, ALKPHOS, BILITOT, PROT, ALBUMIN in the last 168 hours. No results for input(s): LIPASE, AMYLASE in the last 168 hours. No results for input(s): AMMONIA in the last 168 hours. Coagulation Profile: No results for input(s): INR, PROTIME in the last 168 hours. Cardiac Enzymes: No results for input(s): CKTOTAL, CKMB, CKMBINDEX, TROPONINI in the last 168 hours. BNP (last 3 results) No results for input(s): PROBNP in the last 8760 hours. HbA1C: No results for input(s): HGBA1C in the last 72 hours. CBG: No results for input(s): GLUCAP in the last 168 hours. Lipid Profile: No results for input(s): CHOL, HDL, LDLCALC, TRIG, CHOLHDL, LDLDIRECT in the last 72 hours. Thyroid Function Tests: No results for  input(s): TSH, T4TOTAL, FREET4, T3FREE, THYROIDAB in the last 72 hours. Anemia Panel: No results for input(s): VITAMINB12, FOLATE, FERRITIN, TIBC, IRON, RETICCTPCT in the last 72 hours. Sepsis Labs: No results for input(s): PROCALCITON, LATICACIDVEN in the last 168 hours.  Recent Results (from the past 240 hour(s))  CULTURE, BLOOD (ROUTINE X 2) w Reflex to ID Panel     Status: None (Preliminary result)   Collection Time: 10/23/19  8:07 AM   Specimen: BLOOD  Result Value Ref Range Status   Specimen Description   Final    BLOOD LEFT ANTECUBITAL Performed at [facility], [place] [address] [place], [place] [address]    Special Requests   Final    BOTTLES DRAWN AEROBIC AND ANAEROBIC Blood Culture adequate volume   Culture   Final    NO GROWTH 1 DAY Performed at [facility], [address] [place], [place] [address]    Report Status PENDING  Incomplete  CULTURE, BLOOD (ROUTINE X 2) w Reflex to ID Panel     Status: None (Preliminary result)   Collection Time: 10/23/19  8:07 AM   Specimen: BLOOD  Result Value Ref Range Status   Specimen Description BLOOD RIGHT [facility]  Final   Special Requests   Final  BOTTLES DRAWN AEROBIC AND ANAEROBIC Blood Culture adequate volume   Culture   Final    NO GROWTH 1 DAY Performed at [facility], [address], [place], [place] [address]    Report Status PENDING  Incomplete         Radiology Studies: No results found.      Scheduled Meds: . Chlorhexidine Gluconate Cloth  6 each Topical Daily  . enoxaparin (LOVENOX) injection  40 mg Subcutaneous Q24H  . feeding supplement (ENSURE ENLIVE)  237 mL Oral TID BM  . mirabegron ER  50 mg Oral Daily  . multivitamin with minerals  1 tablet Oral Daily  . sodium chloride flush  5 mL Intravenous Q8H   Continuous Infusions: . sodium chloride 500 mL (10/19/19 1044)     LOS: 19 days    Time spent: 35 minutes    Sidney Ace, MD [facility] Pager  [phone]  If 7PM-7AM, please contact night-coverage [email or web address] Password [email or web address] 10/24/2019, 1:41 PM

## 2019-10-24 NOTE — Progress Notes (Signed)
Physical Therapy Treatment Patient Details Name: Vincent Poole MRN: [id number] DOB: 1935-10-08 Today's Date: 10/24/2019    History of Present Illness Pt. is an 84 y.o. male with medical history significant for locally-metastatic bladder cancer on chemotherapy, urinary tract obstruction with b/l nephrostomy tubes in place, recurrent UTI, htn, colostomy in place. Pt with PMH of GERD, anxiety, arthritis, history of atrial flutter, R femur fx, HTN, prostate cancer, umbilical hernia, wound eschar of L foot, multiple bladder surgeries/procedures. Pt presents to ED with a red "lump" on his lower abdomen in the suprapubic region for several months, has open/draining; noted at recent hospitalization thought to be 2/2 metastatic bladder cancer. Pt given broad spectrum abx and CT abd ordered.    PT Comments    Pt is making limited progress towards goals with ability to perform there-ex in bed this date. Able to sit at EOB briefly, however limited by dizziness. Very HOH and fatigues quickly with exertion, needing rest breaks during there-ex. Will continue to progress.   Follow Up Recommendations  SNF     Equipment Recommendations  None recommended by PT    Recommendations for Other Services       Precautions / Restrictions Precautions Precautions: Fall Precaution Comments: bilateral nephrostomy tubes, colostomy; abdominal drain Restrictions Weight Bearing Restrictions: No    Mobility  Bed Mobility Overal bed mobility: Needs Assistance Bed Mobility: Supine to Sit;Rolling Rolling: Min assist   Supine to sit: Max assist Sit to supine: Max assist   General bed mobility comments: able to perform rolling to B sides with min assist. Assisted to EOB with assist required for B LEs. Once seated, pt becomes very dizzy and unable to tolerate for longer than a few minutes. Once back in bed, needs assist to slide up in bed with max assist  Transfers                 General transfer  comment: unable  Ambulation/Gait                 Stairs             Wheelchair Mobility    Modified Rankin (Stroke Patients Only)       Balance                                            Cognition Arousal/Alertness: Awake/alert Behavior During Therapy: WFL for tasks assessed/performed Overall Cognitive Status: Within Functional Limits for tasks assessed                                 General Comments: very HOH      Exercises Other Exercises Other Exercises: supine ther-ex performed including B LE pedal pushes, quad sets, SLRs, SAQ, and hip abd/add. All ther-ex performed x 10 reps with multiple rest breaks between ther-ex.  HR at 11bpm with exertion.    General Comments        Pertinent Vitals/Pain Pain Assessment: Faces Faces Pain Scale: Hurts little more Pain Location: abdomen pain Pain Descriptors / Indicators: Dull Pain Intervention(s): Limited activity within patient's tolerance    Home Living                      Prior Function            PT  Goals (current goals can now be found in the care plan section) Acute Rehab PT Goals Patient Stated Goal: Pt. be able to do more by himself PT Goal Formulation: With patient Time For Goal Achievement: 10/27/19 Potential to Achieve Goals: Good Progress towards PT goals: Progressing toward goals    Frequency    Min 2X/week      PT Plan Current plan remains appropriate    Co-evaluation              AM-PAC PT "6 Clicks" Mobility   Outcome Measure  Help needed turning from your back to your side while in a flat bed without using bedrails?: A Lot Help needed moving from lying on your back to sitting on the side of a flat bed without using bedrails?: A Lot Help needed moving to and from a bed to a chair (including a wheelchair)?: Total Help needed standing up from a chair using your arms (e.g., wheelchair or bedside chair)?: Total Help needed to  walk in hospital room?: Total Help needed climbing 3-5 steps with a railing? : Total 6 Click Score: 8    End of Session   Activity Tolerance: Patient tolerated treatment well;Patient limited by fatigue Patient left: in bed;with bed alarm set;with call bell/phone within reach Nurse Communication: Mobility status PT Visit Diagnosis: Other abnormalities of gait and mobility (R26.89);Muscle weakness (generalized) (M62.81);Unsteadiness on feet (R26.81)     Time: 7062-3762 PT Time Calculation (min) (ACUTE ONLY): 17 min  Charges:  $Therapeutic Exercise: 8-22 mins                     Vincent Poole, PT, DPT [phone]    Asta Corbridge 10/24/2019, 3:51 PM

## 2019-10-24 NOTE — Progress Notes (Signed)
OT Cancellation Note  Patient Details Name: Vincent Poole MRN: [id number] DOB: Feb 21, 1935   Cancelled Treatment:    Reason Eval/Treat Not Completed: Fatigue/lethargy limiting ability to participate. Spoke with pt's wife. Pt resting. Pt's wife requesting therapy come back next date, ideally in the am before radiation treatment given fatigue from recent PT treatment. Will re-attempt next date in the morning if possible.   Jeni Salles, MPH, MS, OTR/L ascom [phone] 10/24/19, 4:36 PM

## 2019-10-24 NOTE — Care Management Important Message (Signed)
Important Message  Patient Details  Name: Vincent Poole MRN: [id number] Date of Birth: [birth year]   Medicare Important Message Given:  Yes     Dannette Barbara 10/24/2019, 1:15 PM

## 2019-10-24 NOTE — Consult Note (Signed)
Vincent Poole for Electrolyte Monitoring and Replacement   Recent Labs: Potassium (mmol/L)  Date Value  10/24/2019 3.9   Magnesium (mg/dL)  Date Value  10/24/2019 1.9   Calcium (mg/dL)  Date Value  10/24/2019 8.6 (L)   Albumin (g/dL)  Date Value  10/06/2019 2.4 (L)   Phosphorus (mg/dL)  Date Value  10/10/2019 2.5   Sodium (mmol/L)  Date Value  10/24/2019 138   Corrected Ca: 9.9 mg/dL  Assessment: 84 y.o.malewith medical history significant forlocally-metastatic bladder cancer on chemotherapy, urinary tract obstruction with b/l nephrostomy tubes in place, recurrent UTI, htn, colostomy in place, provoked dvt s/p anticoagulation, who presents with an abscess with sepsis - secondary to locally metastatic bladder cancerconcerning forerosionthrough the anterior abdominal wall.  Goal of Therapy:  Electrolytes WNL  Plan:   No electrolyte replacement indicated.   Electrolytes have been stable: defer f/u until 12/19  Pharmacy will continue to follow along.  Dallie Piles ,PharmD Clinical Pharmacist 10/24/2019 7:32 AM

## 2019-10-25 ENCOUNTER — Ambulatory Visit
Admission: RE | Admit: 2019-10-25 | Discharge: 2019-10-25 | Disposition: A | Discharge status: Home / Self Care | Payer: Medicare Other | Source: Ambulatory Visit | Attending: Radiation Oncology | Admitting: Radiation Oncology

## 2019-10-25 NOTE — Progress Notes (Signed)
PROGRESS NOTE    Vincent Poole  BWL:[id number] DOB: 05-07-1935 DOA: 10/05/2019 PCP: [name], MD   Brief Narrative:  Patient is an 84 year old male with medical history of locally metastatic invasive bladder carcinoma currently on radiation therapy, progressive disease despite multiple lines of treatment.  Hospitalized since 10/05/2019 for management of area of abdominal wall abscess with associated fistulous tract with the latter.  Patient is completed a full course of broad-spectrum IV antibiotics.  Infectious disease consulted at that time who with recommendations for antibiotic discontinuation.  The patient has been receiving radiation therapy with the hospital.  Palliative care was consulted earlier in the admission and despite their efforts the family is unwilling or unable to agree to any de-escalation of care at this time.  12/16: Fever noted over interval.  T-max 100.7 on 10/22/2019 at 2101.  No antibiotics.  12/17: No fevers.  Patient in some pain otherwise no interval changes  12/18: Patient's wife is at bedside.  Peer-to-peer declined yesterday.  Explained this to the patient's wife.  She expressed understanding.  No acute status changes for patient   Assessment & Plan:   Principal Problem:   Intra-abdominal infection Active Problems:   Sepsis ([place])   Essential hypertension   Prostate cancer (HCC)   Urinary obstruction   Malignant neoplasm of urinary bladder (HCC)   Pressure injury of skin   S/P partial resection of colon   Intra-abdominal abscess (HCC)  Fever:- resolved Unexplained etiology Resolve spontaneously without initiation of antibiotic Repeat blood cultures negative to date No antibiotics indicated  Sepsis secondary to abdominal wall abscess/significant leukocytosis:  He has completed IV antibiotics.  No further antibiotics at this time  Sinus tachycardia: This is intermittent and asymptomatic.  This is likely due to underlying abdominal  wall abscess/malignancy.   Metastatic bladder cancer:  Prognosis is poor but patient and his wife still want to continue radiation therapy.  They are not ready for hospice.    Acute on chronic leukocytosis: Likely worsened by infection  Hypokalemia and hypomagnesemia: Improved.   Bilateral nephrostomy tubes: Continue nephrostomy care per nursing.  Stage I sacral decubitus ulcer: This was present on admission.  Continue local wound care.  Functional decline: Patient has had a very extensive and complicated disease process since diagnosis of bladder cancer in 2018.  He currently has bilateral nephrostomy tubes, diverting colostomy, ostomy over abdominal wall abscess, bedbound.  Working currently with transition of care team to secure a safe disposition for patient.  Initial concern the patient may not be able to participate in intensive skilled nursing however his complex medical needs exceed what the family may be willing to care for at home.  I will continue to work to secure a safe and appropriate disposition for this patient.  Perform peer-to-peer with [facility] on 10/24/2019.  Spoke with a Dr. Nat Christen.  Peer-to-peer was declined for reasons that are somewhat unclear to this provider.  We do have our in-house physical and occupational therapist that have both recommended skilled nursing facility placement.  In my clinical impression this patient needs a monitored setting due to his extensive medical history.  He also has numerous drains and lines which are at high risk for infection should this patient return home.  These include bilateral nephrostomy tubes, colostomy, vesicular cutaneous fistulous tract with associated abscess formation.  These comorbidities in the setting of his functional decline and metastatic bladder carcinoma supportive skilled nursing facility is an appropriate disposition.  DVT  prophylaxis: Lovenox Code Status: DNR Family  Communication: spoke to wife at bedside 12/18 Disposition Plan: Unclear at this time.  Skilled nursing facility has declined.  Exploring alternative options  Consultants:   Pallative care  Procedures:   none  Antimicrobials:  None currently   Subjective: Seen and examined No acute events overnight In some pain but answers all questions appropriately  Objective: Vitals:   10/24/19 2010 10/25/19 0453 10/25/19 0927 10/25/19 1258  BP: 111/64 119/60  (!) 106/50  Pulse: (!) 115 (!) 124 (!) 104 100  Resp: 20 20    Temp: 99 F (37.2 C) 98.6 F (37 C)  98.1 F (36.7 C)  TempSrc: Oral Oral  Axillary  SpO2: 95% 94% 94% 96%  Weight:      Height:        Intake/Output Summary (Last 24 hours) at 10/25/2019 1431 Last data filed at 10/25/2019 0930 Gross per 24 hour  Intake 5 ml  Output 1315 ml  Net -1310 ml   Filed Weights   10/05/19 1418 10/05/19 2139 10/23/19 1018  Weight: 65.8 kg 66.6 kg 67.2 kg    Examination:  General exam:Appears calm and comfortable  Respiratory system: Clear to auscultation. Respiratory effort normal. Cardiovascular system:S1 &S2 heard, RRR. No JVD, murmurs, rubs, gallops or clicks. No pedal edema. Gastrointestinal system:Abdomenwith colostomy bag no output no signs of infection,post I&D wound on abdominal wall no evidence of bleeding or exuding purulence through dressing at this point time, did not take dressing down Central nervous system:Alert, Extremities:Warm well perfused  skin:Lesions abdominal wall as above otherwise no rashes, lesions or ulcers Psychiatry:Judgement and insightare poor. Mood & affectflat    Data Reviewed: I have personally reviewed following labs and imaging studies  CBC: Recent Labs  Lab 10/19/19 0348  WBC 70.8*  NEUTROABS 60.2*  HGB 9.8*  HCT 30.5*  MCV 90.5  PLT 161   Basic Metabolic Panel: Recent Labs  Lab 10/19/19 0348 10/22/19 0420 10/24/19 0527  NA 138 138 138  K 3.7 3.9 3.9  CL 104  98 99  CO2 26 28 26   GLUCOSE 102* 110* 104*  BUN 15 18 22   CREATININE 0.71 0.69 0.80  CALCIUM 8.0* 8.6* 8.6*  MG 1.7 1.9 1.9   GFR: Estimated Creatinine Clearance: 67.7 mL/min (by C-G formula based on SCr of 0.8 mg/dL). Liver Function Tests: No results for input(s): AST, ALT, ALKPHOS, BILITOT, PROT, ALBUMIN in the last 168 hours. No results for input(s): LIPASE, AMYLASE in the last 168 hours. No results for input(s): AMMONIA in the last 168 hours. Coagulation Profile: No results for input(s): INR, PROTIME in the last 168 hours. Cardiac Enzymes: No results for input(s): CKTOTAL, CKMB, CKMBINDEX, TROPONINI in the last 168 hours. BNP (last 3 results) No results for input(s): PROBNP in the last 8760 hours. HbA1C: No results for input(s): HGBA1C in the last 72 hours. CBG: No results for input(s): GLUCAP in the last 168 hours. Lipid Profile: No results for input(s): CHOL, HDL, LDLCALC, TRIG, CHOLHDL, LDLDIRECT in the last 72 hours. Thyroid Function Tests: No results for input(s): TSH, T4TOTAL, FREET4, T3FREE, THYROIDAB in the last 72 hours. Anemia Panel: No results for input(s): VITAMINB12, FOLATE, FERRITIN, TIBC, IRON, RETICCTPCT in the last 72 hours. Sepsis Labs: No results for input(s): PROCALCITON, LATICACIDVEN in the last 168 hours.  Recent Results (from the past 240 hour(s))  CULTURE, BLOOD (ROUTINE X 2) w Reflex to ID Panel     Status: None (Preliminary result)   Collection Time: 10/23/19  8:07 AM   Specimen: BLOOD  Result Value Ref Range Status   Specimen Description   Final    BLOOD LEFT ANTECUBITAL Performed at [facility], [place] [address], [place], [place] [address]    Special Requests   Final    BOTTLES DRAWN AEROBIC AND ANAEROBIC Blood Culture adequate volume   Culture   Final    NO GROWTH 2 DAYS Performed at [facility], [address] [place], [place] [address]    Report Status PENDING  Incomplete  CULTURE, BLOOD (ROUTINE X 2) w Reflex to ID  Panel     Status: None (Preliminary result)   Collection Time: 10/23/19  8:07 AM   Specimen: BLOOD  Result Value Ref Range Status   Specimen Description BLOOD RIGHT [facility]  Final   Special Requests   Final    BOTTLES DRAWN AEROBIC AND ANAEROBIC Blood Culture adequate volume   Culture   Final    NO GROWTH 2 DAYS Performed at [facility], [address] [place], [place] [address]    Report Status PENDING  Incomplete         Radiology Studies: No results found.      Scheduled Meds: . Chlorhexidine Gluconate Cloth  6 each Topical Daily  . enoxaparin (LOVENOX) injection  40 mg Subcutaneous Q24H  . feeding supplement (ENSURE ENLIVE)  237 mL Oral TID BM  . mirabegron ER  50 mg Oral Daily  . multivitamin with minerals  1 tablet Oral Daily  . sodium chloride flush  5 mL Intravenous Q8H   Continuous Infusions: . sodium chloride 500 mL (10/19/19 1044)     LOS: 20 days    Time spent: 35 minutes    Sidney Ace, MD [facility] Pager [phone]  If 7PM-7AM, please contact night-coverage [email or web address] Password [facility] 10/25/2019, 2:31 PM

## 2019-10-25 NOTE — Progress Notes (Signed)
Occupational Therapy Treatment Patient Details Name: Vincent Poole MRN: [id number] DOB: 05-Jan-1935 Today's Date: 10/25/2019    History of present illness Pt. is an 84 y.o. male with medical history significant for locally-metastatic bladder cancer on chemotherapy, urinary tract obstruction with b/l nephrostomy tubes in place, recurrent UTI, htn, colostomy in place. Pt with PMH of GERD, anxiety, arthritis, history of atrial flutter, R femur fx, HTN, prostate cancer, umbilical hernia, wound eschar of L foot, multiple bladder surgeries/procedures. Pt presents to ED with a red "lump" on his lower abdomen in the suprapubic region for several months, has open/draining; noted at recent hospitalization thought to be 2/2 metastatic bladder cancer. Pt given broad spectrum abx and CT abd ordered.   OT comments  Pt seen for OT tx this am. Pt agreeable and eager to perform grooming and bathing tasks. Pt reporting 5-6/10 abdominal pain that "wasn't too bad." Pt able to roll with Min A side to side for bathing and linens change with OT and nurse tech. Able to maintain sidelying with CGA from therapist. Mod-Max A for UB and LB bathing. Set up for grooming tasks. Pt HR 104, O2 94%, stable throughout. Pt progressing slowly towards goals. Current OT goals remain appropriate. Frequency updated. Continue to recommend SNF.   Follow Up Recommendations  SNF    Equipment Recommendations  None recommended by OT    Recommendations for Other Services      Precautions / Restrictions Precautions Precautions: Fall Precaution Comments: bilateral nephrostomy tubes, colostomy; abdominal drain Restrictions Weight Bearing Restrictions: No       Mobility Bed Mobility Overal bed mobility: Needs Assistance Bed Mobility: Rolling Rolling: Min assist         General bed mobility comments: Min A for log roll for skin checks by RN and for bed level bathing; denied dizziness  Transfers                       Balance                                           ADL either performed or assessed with clinical judgement   ADL Overall ADL's : Needs assistance/impaired     Grooming: Bed level;Oral care;Wash/dry face;Wash/dry hands;Brushing hair Grooming Details (indicate cue type and reason): set up for grooming tasks, declined EOB to perform Upper Body Bathing: Set up;Moderate assistance;Minimal assistance Upper Body Bathing Details (indicate cue type and reason): min-mod a for UB bathing with wipes provided by nurse tech Lower Body Bathing: Set up;Maximal assistance                               Vision Baseline Vision/History: No visual deficits Patient Visual Report: No change from baseline     Perception     Praxis      Cognition Arousal/Alertness: Awake/alert Behavior During Therapy: WFL for tasks assessed/performed Overall Cognitive Status: Within Functional Limits for tasks assessed                                 General Comments: very [facility]        Exercises     Shoulder Instructions       General Comments drains in place at start and end of session  Pertinent Vitals/ Pain       Pain Assessment: 0-10 Pain Score: 6  Pain Location: abdomen pain Pain Descriptors / Indicators: Dull;Aching Pain Intervention(s): Limited activity within patient's tolerance;Monitored during session;Repositioned  Home Living                                          Prior Functioning/Environment              Frequency  Min 1X/week        Progress Toward Goals  OT Goals(current goals can now be found in the care plan section)  Progress towards OT goals: Progressing toward goals  Acute Rehab OT Goals Patient Stated Goal: Pt. be able to do more by himself OT Goal Formulation: With patient Potential to Achieve Goals: Good  Plan Discharge plan remains appropriate;Frequency needs to be updated     Co-evaluation                 AM-PAC OT "6 Clicks" Daily Activity     Outcome Measure   Help from another person eating meals?: None Help from another person taking care of personal grooming?: A Little Help from another person toileting, which includes using toliet, bedpan, or urinal?: A Lot Help from another person bathing (including washing, rinsing, drying)?: A Lot Help from another person to put on and taking off regular upper body clothing?: A Lot Help from another person to put on and taking off regular lower body clothing?: A Lot 6 Click Score: 15    End of Session    OT Visit Diagnosis: Muscle weakness (generalized) (M62.81);Pain Pain - Right/Left: (abdomen)   Activity Tolerance Patient tolerated treatment well   Patient Left in bed;with call bell/phone within reach;with bed alarm set;with nursing/sitter in room   Nurse Communication          Time: 1552-0802 OT Time Calculation (min): 27 min  Charges: OT General Charges $OT Visit: 1 Visit OT Treatments $Self Care/Home Management : 23-37 mins  Jeni Salles, MPH, MS, OTR/L ascom [phone] 10/25/19, 9:34 AM

## 2019-10-25 NOTE — TOC Progression Note (Signed)
Transition of Care [facility]) - Progression Note    Patient Details  Name: Vincent Poole MRN: [id number] Date of Birth: June 29, 1935  Transition of Care [facility]) CM/SW [place], [place] Phone Number: 10/25/2019, 3:12 PM  Clinical Narrative:     CSW met with patient and patient's wife. Patient's wife stated that she has supplement insurance through Medicare that she pays an extra $200 dollars for. CSW gave the fast track appeals number to the patient's wife for the denial from [facility] for the [facility]  Patient's wife contacted the CSW and let know that she called the referral number and they stated that a peer to peer would need to happen. CSW explained that the peer to peer did happen and both doctors agreed that the patient is not appropriate for SNF due to him not being able to ambulate much and having poor functional status. Patient's wife did not understand and stated that the appeals told her that they did not have enough information.  Patient's wife stated that she does not want him to have home health and that she is better off taking care of him but she cannot keep taking him to and from radiology that is outpatient.   CSW reached out to supervisors, Pete Pelt and Aida Raider about direction with case.   Expected Discharge Plan: Home/Self Care Barriers to Discharge: Continued Medical Work up  Expected Discharge Plan and Services Expected Discharge Plan: Home/Self Care       Living arrangements for the past 2 months: Single Family Home                                       Social Determinants of Health (SDOH) Interventions    Readmission Risk Interventions Readmission Risk Prevention Plan 10/10/2019 09/27/2019 04/28/2019  Transportation Screening Complete Complete Complete  PCP or Specialist Appt within 3-5 Days - - Not Complete  HRI or [place] - - Complete  Social Work Consult for [place] Planning/Counseling - - Patient refused   Palliative Care Screening - - -  Medication Review Press photographer) Complete Complete Complete  PCP or Specialist appointment within 3-5 days of discharge - Complete -  [place] or [place] Patient refused (No Data) -  SW Recovery Care/Counseling Consult Complete Complete -  Palliative Care Screening - Not Applicable -  [place] - Not Applicable -  Some recent data might be hidden

## 2019-10-26 LAB — BASIC METABOLIC PANEL
Anion gap: 12 (ref 5–15)
BUN: 20 mg/dL (ref 8–23)
CO2: 27 mmol/L (ref 22–32)
Calcium: 8.5 mg/dL — ABNORMAL LOW (ref 8.9–10.3)
Chloride: 100 mmol/L (ref 98–111)
Creatinine, Ser: 0.75 mg/dL (ref 0.61–1.24)
GFR calc Af Amer: >60 mL/min (ref >60)
GFR calc non Af Amer: >60 mL/min (ref >60)
Glucose, Bld: 99 mg/dL (ref 70–99)
Potassium: 3.5 mmol/L (ref 3.5–5.1)
Sodium: 139 mmol/L (ref 135–145)

## 2019-10-26 NOTE — Consult Note (Signed)
[place] for Electrolyte Monitoring and Replacement   Recent Labs: Potassium (mmol/L)  Date Value  10/26/2019 3.5   Magnesium (mg/dL)  Date Value  10/24/2019 1.9   Calcium (mg/dL)  Date Value  10/26/2019 8.5 (L)   Albumin (g/dL)  Date Value  10/06/2019 2.4 (L)   Phosphorus (mg/dL)  Date Value  10/10/2019 2.5   Sodium (mmol/L)  Date Value  10/26/2019 139   Corrected Ca: 9.9 mg/dL  Assessment: 84 y.o.malewith medical history significant forlocally-metastatic bladder cancer on chemotherapy, urinary tract obstruction with b/l nephrostomy tubes in place, recurrent UTI, htn, colostomy in place, provoked dvt s/p anticoagulation, who presents with an abscess with sepsis - secondary to locally metastatic bladder cancerconcerning forerosionthrough the anterior abdominal wall.  Goal of Therapy:  Electrolytes WNL  Plan:  No electrolyte replacement indicated.  Pharmacy will continue to follow.  Olivia Canter [facility] Clinical Pharmacist 10/26/2019 10:06 AM

## 2019-10-26 NOTE — Progress Notes (Signed)
Physical Therapy Treatment Patient Details Name: Vincent Poole MRN: [id number] DOB: Nov 01, 1935 Today's Date: 10/26/2019    History of Present Illness Pt. is an 84 y.o. male with medical history significant for locally-metastatic bladder cancer on chemotherapy, urinary tract obstruction with b/l nephrostomy tubes in place, recurrent UTI, htn, colostomy in place. Pt with PMH of GERD, anxiety, arthritis, history of atrial flutter, R femur fx, HTN, prostate cancer, umbilical hernia, wound eschar of L foot, multiple bladder surgeries/procedures. Pt presents to ED with a red "lump" on his lower abdomen in the suprapubic region for several months, has open/draining; noted at recent hospitalization thought to be 2/2 metastatic bladder cancer. Pt given broad spectrum abx and CT abd ordered.    PT Comments    Pt ready for session.  Some improvement and decreased assist with bed mobility but remains with heavy mod a x 1.  He is able to increase sitting balance time to 14 minutes and add in seated exercises this session which he struggled with last session.  He does report feeling "woozy" but it does not limit sitting time.  He continues with general weakness and inability to stand/transfer.    If pt does not discharge to SNF, he will requires a hospital bed, mechanical lift and wheelchair if discharged home along with HHPT.   Follow Up Recommendations  SNF     Equipment Recommendations  None recommended by PT  - See above.  Recommendations for Other Services       Precautions / Restrictions Precautions Precautions: Fall Precaution Comments: bilateral nephrostomy tubes, colostomy; abdominal drain Restrictions Weight Bearing Restrictions: No    Mobility  Bed Mobility Overal bed mobility: Needs Assistance Bed Mobility: Rolling;Sit to Supine;Sidelying to Sit Rolling: Min assist Sidelying to sit: Max assist;Mod assist   Sit to supine: Max assist;Mod assist   General bed mobility comments:  does well rolling with rail but unable to lift trunk up off bed without mod A.  Improved initiation today.  Transfers                 General transfer comment: Deferred. Pt. is unable.  Ambulation/Gait             General Gait Details: Unable   Stairs             Wheelchair Mobility    Modified Rankin (Stroke Patients Only)       Balance Overall balance assessment: Needs assistance Sitting-balance support: Feet supported Sitting balance-Leahy Scale: Fair Sitting balance - Comments: able to sit unsupproted x 14 minutes tody before fatigue.  Minimal "woozy" reports.       Standing balance comment: unable                            Cognition Arousal/Alertness: Awake/alert Behavior During Therapy: WFL for tasks assessed/performed Overall Cognitive Status: Within Functional Limits for tasks assessed                                 General Comments: very HOH      Exercises Other Exercises Other Exercises: seated ankle pumps and LAQ today x 10.  Able to maintian balance today during unsupported sitting which he had difficulty with last session.    General Comments        Pertinent Vitals/Pain Pain Assessment: Faces Faces Pain Scale: Hurts a little bit Pain Location: abdomen pain  Pain Descriptors / Indicators: Dull;Aching Pain Intervention(s): Limited activity within patient's tolerance;Monitored during session    Home Living                      Prior Function            PT Goals (current goals can now be found in the care plan section) Progress towards PT goals: Progressing toward goals    Frequency    Min 2X/week      PT Plan Current plan remains appropriate    Co-evaluation              AM-PAC PT "6 Clicks" Mobility   Outcome Measure  Help needed turning from your back to your side while in a flat bed without using bedrails?: A Lot Help needed moving from lying on your back to sitting  on the side of a flat bed without using bedrails?: A Lot Help needed moving to and from a bed to a chair (including a wheelchair)?: Total Help needed standing up from a chair using your arms (e.g., wheelchair or bedside chair)?: Total Help needed to walk in hospital room?: Total Help needed climbing 3-5 steps with a railing? : Total 6 Click Score: 8    End of Session Equipment Utilized During Treatment: Gait belt Activity Tolerance: Patient tolerated treatment well;Patient limited by fatigue Patient left: in bed;with bed alarm set;with call bell/phone within reach Nurse Communication: Mobility status       Time: 3559-7416 PT Time Calculation (min) (ACUTE ONLY): 18 min  Charges:  $Therapeutic Exercise: 8-22 mins                    Chesley Noon, PTA 10/26/19, 10:51 AM

## 2019-10-26 NOTE — TOC Progression Note (Addendum)
Transition of Care [facility]) - Progression Note    Patient Details  Name: Vincent Poole MRN: [id number] Date of Birth: [birth year]  Transition of Care [facility]) CM/SW Contact  Marshell Garfinkel, RN Phone Number: 10/26/2019, 2:52 PM  Clinical Narrative:    RNCM attempted to reach [place] at [phone] and [phone]- message left. Update: wife called me back and said that she has appealed SNF denial however her insurance has told her different; to clarify, her insurance has "assured her that he has SNF coverage and she was led to believe they would cover SNF".  She said that "you can't expect him to be able to get up and go after laying in bed for 3 weeks".  She states that at baseline he was able to push himself up out of bed; She would provide assistance but he was able to walk out to their porch deck. He sometimes used a wheelchair for long distances. He was out on deck prior to this admit. Patient has bed offer at [facility].  Update: [employer] told wife that she wants doctor to have face to face with [employer].    Expected Discharge Plan: Home/Self Care Barriers to Discharge: Continued Medical Work up  Expected Discharge Plan and Services Expected Discharge Plan: Home/Self Care       Living arrangements for the past 2 months: Single Family Home                                       Social Determinants of Health (SDOH) Interventions    Readmission Risk Interventions Readmission Risk Prevention Plan 10/10/2019 09/27/2019 04/28/2019  Transportation Screening Complete Complete Complete  PCP or Specialist Appt within 3-5 Days - - Not Complete  HRI or [place] - - Complete  Social Work Consult for [place] Planning/Counseling - - Patient refused  Palliative Care Screening - - -  Medication Review Press photographer) Complete Complete Complete  PCP or Specialist appointment within 3-5 days of discharge - Complete -  [place] or [place] Patient refused (No Data) -   SW Recovery Care/Counseling Consult Complete Complete -  Palliative Care Screening - Not Applicable -  [place] - Not Applicable -  Some recent data might be hidden

## 2019-10-26 NOTE — Progress Notes (Signed)
PROGRESS NOTE    Vincent Poole  ENI:[id number] DOB: 04-Feb-1935 DOA: 10/05/2019 PCP: [name], MD   Brief Narrative:  Patient is an 84 year old male with medical history of locally metastatic invasive bladder carcinoma currently on radiation therapy, progressive disease despite multiple lines of treatment.  Hospitalized since 10/05/2019 for management of area of abdominal wall abscess with associated fistulous tract with the latter.  Patient is completed a full course of broad-spectrum IV antibiotics.  Infectious disease consulted at that time who with recommendations for antibiotic discontinuation.  The patient has been receiving radiation therapy with the hospital.  Palliative care was consulted earlier in the admission and despite their efforts the family is unwilling or unable to agree to any de-escalation of care at this time.  12/16: Fever noted over interval.  T-max 100.7 on 10/22/2019 at 2101.  No antibiotics.  12/17: No fevers.  Patient in some pain otherwise no interval changes  12/18: Patient's wife is at bedside.  Peer-to-peer declined yesterday.  Explained this to the patient's wife.  She expressed understanding.  No acute status changes for patient  12/19: Patient's wife at bedside.  Transition of care team spoke with her yesterday.  Case has been escalated to senior leadership.  No acute status changes per patient.   Assessment & Plan:   Principal Problem:   Intra-abdominal infection Active Problems:   Sepsis ([place])   Essential hypertension   Prostate cancer ([place])   Urinary obstruction   Malignant neoplasm of urinary bladder (HCC)   Pressure injury of skin   S/P partial resection of colon   Intra-abdominal abscess (HCC)  Fever:- resolved Unexplained etiology Resolve spontaneously without initiation of antibiotic Repeat blood cultures negative to date No antibiotics indicated  Sepsis secondary to abdominal wall abscess/significant leukocytosis:  He has  completed IV antibiotics.  No further antibiotics at this time  Sinus tachycardia: This is intermittent and asymptomatic.  This is likely due to underlying abdominal wall abscess/malignancy.   Metastatic bladder cancer:  Prognosis is poor but patient and his wife still want to continue radiation therapy.  They are not ready for hospice.    Acute on chronic leukocytosis: Likely worsened by infection  Hypokalemia and hypomagnesemia: Improved.   Bilateral nephrostomy tubes: Continue nephrostomy care per nursing.  Stage I sacral decubitus ulcer: This was present on admission.  Continue local wound care.  Functional decline: Patient has had a very extensive and complicated disease process since diagnosis of bladder cancer in 2018.  He currently has bilateral nephrostomy tubes, diverting colostomy, ostomy over abdominal wall abscess, bedbound.  Working currently with transition of care team to secure a safe disposition for patient.  Initial concern the patient may not be able to participate in intensive skilled nursing however his complex medical needs exceed what the family may be willing to care for at home.  I will continue to work to secure a safe and appropriate disposition for this patient.  Perform peer-to-peer with Market researcher of [facility] on 10/24/2019.  Spoke with a Dr. Nat Christen.  Peer-to-peer was declined for reasons that are somewhat unclear to this provider.  We do have our in-house physical and occupational therapist that have both recommended skilled nursing facility placement.    In my clinical impression this patient needs a monitored setting due to his extensive medical history.  He also has numerous drains and lines which are at high risk for infection should this patient return home.  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  These comorbidities in the setting of his functional  decline and metastatic bladder carcinoma supportive skilled nursing facility is an appropriate disposition.  I do not feel that this patient will receive the appropriate care in a home setting.  DVT prophylaxis: Lovenox Code Status: DNR Family Communication: spoke to wife at bedside 12/19 Disposition Plan: Unclear at this time.  Skilled nursing facility has declined.  Exploring alternative options  Consultants:   Pallative care  Procedures:   none  Antimicrobials:  None currently   Subjective: Seen and examined No acute events overnight In some pain but answers all questions appropriately Wife at bedside  Objective: Vitals:   10/25/19 2207 10/26/19 0602 10/26/19 1236 10/26/19 1239  BP: 112/61 100/64 119/71   Pulse: (!) 113 (!) 104 78   Resp: 17 17 20    Temp: 98.2 F (36.8 C) 98.5 F (36.9 C)  97.7 F (36.5 C)  TempSrc: Oral Oral  Oral  SpO2: 95% 95% 96%   Weight:      Height:        Intake/Output Summary (Last 24 hours) at 10/26/2019 1420 Last data filed at 10/26/2019 1400 Gross per 24 hour  Intake 730 ml  Output 1195 ml  Net -465 ml   Filed Weights   10/05/19 1418 10/05/19 2139 10/23/19 1018  Weight: 65.8 kg 66.6 kg 67.2 kg    Examination:  General exam:Appears calm and comfortable  Respiratory system: Clear to auscultation. Respiratory effort normal. Cardiovascular system:S1 &S2 heard, RRR. No JVD, murmurs, rubs, gallops or clicks. No pedal edema. Gastrointestinal system:Abdomenwith colostomy bag no output no signs of infection,post I&D wound on abdominal wall no evidence of bleeding or exuding purulence through dressing at this point time, did not take dressing down Central nervous system:Alert, Extremities:Warm well perfused  skin:Lesions abdominal wall as above otherwise no rashes, lesions or ulcers Psychiatry:Judgement and insightare poor. Mood & affectflat    Data Reviewed: I have personally reviewed following labs and imaging  studies  CBC: No results for input(s): WBC, NEUTROABS, HGB, HCT, MCV, PLT in the last 168 hours. Basic Metabolic Panel: Recent Labs  Lab 10/22/19 0420 10/24/19 0527 10/26/19 0449  NA 138 138 139  K 3.9 3.9 3.5  CL 98 99 100  CO2 28 26 27   GLUCOSE 110* 104* 99  BUN 18 22 20   CREATININE 0.69 0.80 0.75  CALCIUM 8.6* 8.6* 8.5*  MG 1.9 1.9  --    GFR: Estimated Creatinine Clearance: 67.7 mL/min (by C-G formula based on SCr of 0.75 mg/dL). Liver Function Tests: No results for input(s): AST, ALT, ALKPHOS, BILITOT, PROT, ALBUMIN in the last 168 hours. No results for input(s): LIPASE, AMYLASE in the last 168 hours. No results for input(s): AMMONIA in the last 168 hours. Coagulation Profile: No results for input(s): INR, PROTIME in the last 168 hours. Cardiac Enzymes: No results for input(s): CKTOTAL, CKMB, CKMBINDEX, TROPONINI in the last 168 hours. BNP (last 3 results) No results for input(s): PROBNP in the last 8760 hours. HbA1C: No results for input(s): HGBA1C in the last 72 hours. CBG: No results for input(s): GLUCAP in the last 168 hours. Lipid Profile: No results for input(s): CHOL, HDL, LDLCALC, TRIG, CHOLHDL, LDLDIRECT in the last 72 hours. Thyroid Function Tests: No results for input(s): TSH, T4TOTAL, FREET4, T3FREE, THYROIDAB in the last 72 hours. Anemia Panel: No results for input(s): VITAMINB12, FOLATE, FERRITIN, TIBC, IRON, RETICCTPCT in the last 72 hours. Sepsis Labs: No results  for input(s): PROCALCITON, LATICACIDVEN in the last 168 hours.  Recent Results (from the past 240 hour(s))  CULTURE, BLOOD (ROUTINE X 2) w Reflex to ID Panel     Status: None (Preliminary result)   Collection Time: 10/23/19  8:07 AM   Specimen: BLOOD  Result Value Ref Range Status   Specimen Description   Final    BLOOD LEFT ANTECUBITAL Performed at [facility], [place] [address], [place], [place] [address]    Special Requests   Final    BOTTLES DRAWN AEROBIC AND ANAEROBIC Blood  Culture adequate volume   Culture   Final    NO GROWTH 3 DAYS Performed at [facility], [address] [place], [place] [address]    Report Status PENDING  Incomplete  CULTURE, BLOOD (ROUTINE X 2) w Reflex to ID Panel     Status: None (Preliminary result)   Collection Time: 10/23/19  8:07 AM   Specimen: BLOOD  Result Value Ref Range Status   Specimen Description BLOOD RIGHT [facility]  Final   Special Requests   Final    BOTTLES DRAWN AEROBIC AND ANAEROBIC Blood Culture adequate volume   Culture   Final    NO GROWTH 3 DAYS Performed at [facility], [address] [place], [place] [address]    Report Status PENDING  Incomplete         Radiology Studies: No results found.      Scheduled Meds: . Chlorhexidine Gluconate Cloth  6 each Topical Daily  . enoxaparin (LOVENOX) injection  40 mg Subcutaneous Q24H  . feeding supplement (ENSURE ENLIVE)  237 mL Oral TID BM  . mirabegron ER  50 mg Oral Daily  . multivitamin with minerals  1 tablet Oral Daily  . sodium chloride flush  5 mL Intravenous Q8H   Continuous Infusions: . sodium chloride 500 mL (10/19/19 1044)     LOS: 21 days    Time spent: 35 minutes    Sidney Ace, MD [facility] Pager [phone]  If 7PM-7AM, please contact night-coverage [email or web address] Password [email or web address] 10/26/2019, 2:20 PM

## 2019-10-27 LAB — POTASSIUM: Potassium: 4.1 mmol/L (ref 3.5–5.1)

## 2019-10-27 NOTE — TOC Progression Note (Signed)
Transition of Care [facility]) - Progression Note    Patient Details  Name: Vincent Poole MRN: [id number] Date of Birth: 1935/10/25  Transition of Care [facility]) CM/SW Contact  [address] [place], [place] Phone Number: 10/27/2019, 3:15 PM  Clinical Narrative:    This CSW and attending MD met with patient and his spouse at bedside to discuss new discharge plan as patient has been declined SNFauthorization post peer to peer review. Patient's spouse very frustrated and upset, initially firmly declining [place] services as well as  consideration for home Hospice services. "I can do what they do".  Patient's spouse eventually agreed to [facility] services as well as a hospital bed. Patient will be discharging to his granddaughters home:   [facility] [place], [place], [place] [address] [facility] bed ordered through [place], [place] Work [phone]    Expected Discharge Plan: Home/Self Care Barriers to Discharge: Continued Medical Work up  Expected Discharge Plan and Services Expected Discharge Plan: Home/Self Care       Living arrangements for the past 2 months: Single Family Home                                       Social Determinants of Health (SDOH) Interventions    Readmission Risk Interventions Readmission Risk Prevention Plan 10/10/2019 09/27/2019 04/28/2019  Transportation Screening Complete Complete Complete  PCP or Specialist Appt within 3-5 Days - - Not Complete  [place] or [place] - - Complete  Social Work Consult for [place] Planning/Counseling - - Patient refused  Palliative Care Screening - - -  Medication Review Press photographer) Complete Complete Complete  PCP or Specialist appointment within 3-5 days of discharge - Complete -  [place] or [place] Patient refused (No Data) -  SW Recovery Care/Counseling Consult Complete Complete -  Palliative Care Screening - Not Applicable -  [place] - Not Applicable -  Some recent data might be hidden

## 2019-10-27 NOTE — Consult Note (Signed)
[place] for Electrolyte Monitoring and Replacement   Recent Labs: Potassium (mmol/L)  Date Value  10/27/2019 4.1   Magnesium (mg/dL)  Date Value  10/24/2019 1.9   Calcium (mg/dL)  Date Value  10/26/2019 8.5 (L)   Albumin (g/dL)  Date Value  10/06/2019 2.4 (L)   Phosphorus (mg/dL)  Date Value  10/10/2019 2.5   Sodium (mmol/L)  Date Value  10/26/2019 139   Corrected Ca: 9.9 mg/dL  Assessment: 84 y.o.malewith medical history significant forlocally-metastatic bladder cancer on chemotherapy, urinary tract obstruction with b/l nephrostomy tubes in place, recurrent UTI, htn, colostomy in place, provoked dvt s/p anticoagulation, who presents with an abscess with sepsis - secondary to locally metastatic bladder cancerconcerning forerosionthrough the anterior abdominal wall.  Goal of Therapy:  Electrolytes WNL  Plan:  No electrolyte replacement indicated.  Pharmacy will continue to follow.  Olivia Canter [facility] Clinical Pharmacist 10/27/2019 8:31 AM

## 2019-10-27 NOTE — Progress Notes (Signed)
PROGRESS NOTE    Vincent Poole  EGB:[id number] DOB: 1935/07/15 DOA: 10/05/2019 PCP: [name], MD   Brief Narrative:  Patient is an 84 year old male with medical history of locally metastatic invasive bladder carcinoma currently on radiation therapy, progressive disease despite multiple lines of treatment.  Hospitalized since 10/05/2019 for management of area of abdominal wall abscess with associated fistulous tract with the latter.  Patient is completed a full course of broad-spectrum IV antibiotics.  Infectious disease consulted at that time who with recommendations for antibiotic discontinuation.  The patient has been receiving radiation therapy with the hospital.  Palliative care was consulted earlier in the admission and despite their efforts the family is unwilling or unable to agree to any de-escalation of care at this time.  12/16: Fever noted over interval.  T-max 100.7 on 10/22/2019 at 2101.  No antibiotics.  12/17: No fevers.  Patient in some pain otherwise no interval changes  12/18: Patient's wife is at bedside.  Peer-to-peer declined yesterday.  Explained this to the patient's wife.  She expressed understanding.  No acute status changes for patient  12/19: Patient's wife at bedside.  Transition of care team spoke with her yesterday.  Case has been escalated to senior leadership.  No acute status changes per patient.  12/20: Patient in some pain this morning otherwise no acute status changes.  Patient's wife is at bedside.  Social Youth worker agreed to speak to the patient's wife in the room with myself.  Explained that that insurance would not cover a stay at a skilled nursing facility despite our best efforts.  Patient's wife was understandably upset but understood.  Agreed to plan to take patient home and accept home health services.  Assessment & Plan:   Principal Problem:   Intra-abdominal infection Active Problems:   Sepsis ([place])  Essential hypertension   Prostate cancer ([place])   Urinary obstruction   Malignant neoplasm of urinary bladder (HCC)   Pressure injury of skin   S/P partial resection of colon   Intra-abdominal abscess (HCC)  Fever:- resolved Unexplained etiology Resolve spontaneously without initiation of antibiotic Repeat blood cultures negative to date No antibiotics indicated  Sepsis secondary to abdominal wall abscess/significant leukocytosis:  He has completed IV antibiotics.  No further antibiotics at this time  Sinus tachycardia: This is intermittent and asymptomatic.  This is likely due to underlying abdominal wall abscess/malignancy.   Metastatic bladder cancer:  Prognosis is poor but patient and his wife still want to continue radiation therapy.  They are not ready for hospice.    Acute on chronic leukocytosis: Likely worsened by infection  Hypokalemia and hypomagnesemia: Improved.   Bilateral nephrostomy tubes: Continue nephrostomy care per nursing.  Stage I sacral decubitus ulcer: This was present on admission.  Continue local wound care.  Functional decline: Patient has had a very extensive and complicated disease process since diagnosis of bladder cancer in 2018.  He currently has bilateral nephrostomy tubes, diverting colostomy, ostomy over abdominal wall abscess, bedbound.    Performed peer-to-peer with Market researcher of [facility] on 10/24/2019.  Spoke with Dr. Nat Christen.  Peer-to-peer was declined.  10/27/2019: Lengthy conversation between myself, patient's wife, Scientist, water quality, Agricultural consultant.  Explained that despite her efforts the patient's insurance company would not cover stay in skilled nursing facility.  The patient's wife was understandably upset though is understanding of the situation.  We had offered home with home health services which the patient's wife had initially refused however  later agreed to the patient could be taken to his  granddaughter's house where there is a room that has the capacity to handle a hospital bed.  We explained that this may be the best option to give him the care that he needs and minimize the likelihood return to the hospital.  We again offered hospice/palliative care, which the wife refused.    We will plan to monitor patient overnight while home health services are being established.  Plan to discharge patient home on 10/28/2019.  DVT prophylaxis: Lovenox Code Status: DNR Family Communication: spoke to wife at bedside 12/20 Disposition Plan: Home health, anticipate 10/28/2019  Consultants:   Pallative care  Procedures:   none  Antimicrobials:  None currently   Subjective: Seen and examined No acute events overnight In some pain but answers all questions appropriately Wife at bedside  Objective: Vitals:   10/26/19 2009 10/27/19 0553 10/27/19 0556 10/27/19 1223  BP: (!) 108/55 126/66  115/65  Pulse: (!) 114 (!) 114  (!) 104  Resp: 17   16  Temp: 98.8 F (37.1 C)  97.6 F (36.4 C) 97.8 F (36.6 C)  TempSrc: Oral  Oral Oral  SpO2: 95% 93%  96%  Weight:      Height:        Intake/Output Summary (Last 24 hours) at 10/27/2019 1321 Last data filed at 10/27/2019 0600 Gross per 24 hour  Intake 725 ml  Output 1025 ml  Net -300 ml   Filed Weights   10/05/19 1418 10/05/19 2139 10/23/19 1018  Weight: 65.8 kg 66.6 kg 67.2 kg    Examination:  General exam:Appears calm and comfortable  Respiratory system: Clear to auscultation. Respiratory effort normal. Cardiovascular system:S1 &S2 heard, RRR. No JVD, murmurs, rubs, gallops or clicks. No pedal edema. Gastrointestinal system:Abdomenwith colostomy bag no output no signs of infection,post I&D wound on abdominal wall no evidence of bleeding or exuding purulence through dressing at this point time, did not take dressing down Central nervous system:Alert, Extremities:Warm well perfused  skin:Lesions abdominal wall  as above otherwise no rashes, lesions or ulcers Psychiatry:Judgement and insightare poor. Mood & affectflat    Data Reviewed: I have personally reviewed following labs and imaging studies  CBC: No results for input(s): WBC, NEUTROABS, HGB, HCT, MCV, PLT in the last 168 hours. Basic Metabolic Panel: Recent Labs  Lab 10/22/19 0420 10/24/19 0527 10/26/19 0449 10/27/19 0656  NA 138 138 139  --   K 3.9 3.9 3.5 4.1  CL 98 99 100  --   CO2 28 26 27   --   GLUCOSE 110* 104* 99  --   BUN 18 22 20   --   CREATININE 0.69 0.80 0.75  --   CALCIUM 8.6* 8.6* 8.5*  --   MG 1.9 1.9  --   --    GFR: Estimated Creatinine Clearance: 67.7 mL/min (by C-G formula based on SCr of 0.75 mg/dL). Liver Function Tests: No results for input(s): AST, ALT, ALKPHOS, BILITOT, PROT, ALBUMIN in the last 168 hours. No results for input(s): LIPASE, AMYLASE in the last 168 hours. No results for input(s): AMMONIA in the last 168 hours. Coagulation Profile: No results for input(s): INR, PROTIME in the last 168 hours. Cardiac Enzymes: No results for input(s): CKTOTAL, CKMB, CKMBINDEX, TROPONINI in the last 168 hours. BNP (last 3 results) No results for input(s): PROBNP in the last 8760 hours. HbA1C: No results for input(s): HGBA1C in the last 72 hours. CBG: No results for input(s): GLUCAP in  the last 168 hours. Lipid Profile: No results for input(s): CHOL, HDL, LDLCALC, TRIG, CHOLHDL, LDLDIRECT in the last 72 hours. Thyroid Function Tests: No results for input(s): TSH, T4TOTAL, FREET4, T3FREE, THYROIDAB in the last 72 hours. Anemia Panel: No results for input(s): VITAMINB12, FOLATE, FERRITIN, TIBC, IRON, RETICCTPCT in the last 72 hours. Sepsis Labs: No results for input(s): PROCALCITON, LATICACIDVEN in the last 168 hours.  Recent Results (from the past 240 hour(s))  CULTURE, BLOOD (ROUTINE X 2) w Reflex to ID Panel     Status: None (Preliminary result)   Collection Time: 10/23/19  8:07 AM   Specimen:  BLOOD  Result Value Ref Range Status   Specimen Description   Final    BLOOD LEFT ANTECUBITAL Performed at [facility], [place] [address], [place], [place] [address]    Special Requests   Final    BOTTLES DRAWN AEROBIC AND ANAEROBIC Blood Culture adequate volume   Culture   Final    NO GROWTH 4 DAYS Performed at [facility], [address] [place], [place] [address]    Report Status PENDING  Incomplete  CULTURE, BLOOD (ROUTINE X 2) w Reflex to ID Panel     Status: None (Preliminary result)   Collection Time: 10/23/19  8:07 AM   Specimen: BLOOD  Result Value Ref Range Status   Specimen Description BLOOD RIGHT [facility]  Final   Special Requests   Final    BOTTLES DRAWN AEROBIC AND ANAEROBIC Blood Culture adequate volume   Culture   Final    NO GROWTH 4 DAYS Performed at [facility], [address], [place], [place] [address]    Report Status PENDING  Incomplete         Radiology Studies: No results found.      Scheduled Meds: . Chlorhexidine Gluconate Cloth  6 each Topical Daily  . enoxaparin (LOVENOX) injection  40 mg Subcutaneous Q24H  . feeding supplement (ENSURE ENLIVE)  237 mL Oral TID BM  . mirabegron ER  50 mg Oral Daily  . multivitamin with minerals  1 tablet Oral Daily  . sodium chloride flush  5 mL Intravenous Q8H   Continuous Infusions: . sodium chloride 500 mL (10/19/19 1044)     LOS: 22 days    Time spent: 35 minutes    Sidney Ace, MD [facility] Pager [phone]  If 7PM-7AM, please contact night-coverage [email or web address] Password [email or web address] 10/27/2019, 1:21 PM

## 2019-10-28 ENCOUNTER — Ambulatory Visit: Payer: Medicare Other

## 2019-10-28 ENCOUNTER — Ambulatory Visit
Admission: RE | Admit: 2019-10-28 | Discharge: 2019-10-28 | Disposition: A | Discharge status: Home / Self Care | Payer: Medicare Other | Source: Ambulatory Visit | Attending: Radiation Oncology | Admitting: Radiation Oncology

## 2019-10-28 LAB — CULTURE, BLOOD (ROUTINE X 2)
Culture: NO GROWTH
Culture: NO GROWTH
Special Requests: ADEQUATE
Special Requests: ADEQUATE

## 2019-10-28 LAB — BASIC METABOLIC PANEL
Anion gap: 10 (ref 5–15)
BUN: 25 mg/dL — ABNORMAL HIGH (ref 8–23)
CO2: 28 mmol/L (ref 22–32)
Calcium: 8.8 mg/dL — ABNORMAL LOW (ref 8.9–10.3)
Chloride: 105 mmol/L (ref 98–111)
Creatinine, Ser: 0.84 mg/dL (ref 0.61–1.24)
GFR calc Af Amer: >60 mL/min (ref >60)
GFR calc non Af Amer: >60 mL/min (ref >60)
Glucose, Bld: 108 mg/dL — ABNORMAL HIGH (ref 70–99)
Potassium: 3.7 mmol/L (ref 3.5–5.1)
Sodium: 143 mmol/L (ref 135–145)

## 2019-10-28 LAB — MAGNESIUM: Magnesium: 2.3 mg/dL (ref 1.7–2.4)

## 2019-10-28 MED ORDER — HEPARIN SOD (PORK) LOCK FLUSH 100 UNIT/ML IV SOLN
500.0000 [IU] | Freq: Once | INTRAVENOUS | Status: AC
  Administered 2019-10-29: 500 [IU] via INTRAVENOUS
  Filled 2019-10-28: qty 5

## 2019-10-28 NOTE — Consult Note (Signed)
[place] for Electrolyte Monitoring and Replacement   Recent Labs: Potassium (mmol/L)  Date Value  10/28/2019 3.7   Magnesium (mg/dL)  Date Value  10/28/2019 2.3   Calcium (mg/dL)  Date Value  10/28/2019 8.8 (L)   Albumin (g/dL)  Date Value  10/06/2019 2.4 (L)   Phosphorus (mg/dL)  Date Value  10/10/2019 2.5   Sodium (mmol/L)  Date Value  10/28/2019 143   Corrected Ca: 9.9 mg/dL  Assessment: 84 y.o.malewith medical history significant forlocally-metastatic bladder cancer on chemotherapy, urinary tract obstruction with b/l nephrostomy tubes in place, recurrent UTI, htn, colostomy in place, provoked dvt s/p anticoagulation, who presents with an abscess with sepsis - secondary to locally metastatic bladder cancerconcerning forerosionthrough the anterior abdominal wall.  Goal of Therapy:  Electrolytes WNL  Plan:   No electrolyte replacement indicated  Electrolytes have been stable: next labs 12/23  Pharmacy will continue to follow.  Dallie Piles, [facility] Clinical Pharmacist 10/28/2019 7:19 AM

## 2019-10-28 NOTE — Clinical Social Work Note (Signed)
Patient has bladder cancer which requires head and torso to be positioned in ways not feasible with a normal bed. Head must be elevated at least __35 degree_ or severe pain will occur.  Vincent Poole. Norval Morton, MSW, [employer]  10/28/2019 12:35 PM

## 2019-10-28 NOTE — Progress Notes (Signed)
PROGRESS NOTE    Vincent Poole  IOX:[id number] DOB: [birth year] DOA: 10/05/2019 PCP: [name], MD   Brief Narrative:  Patient is an 84 year old male with medical history of locally metastatic invasive bladder carcinoma currently on radiation therapy, progressive disease despite multiple lines of treatment.  Hospitalized since 10/05/2019 for management of area of abdominal wall abscess with associated fistulous tract with the latter.  Patient is completed a full course of broad-spectrum IV antibiotics.  Infectious disease consulted at that time who with recommendations for antibiotic discontinuation.  The patient has been receiving radiation therapy with the hospital.  Palliative care was consulted earlier in the admission and despite their efforts the family is unwilling or unable to agree to any de-escalation of care at this time.  12/16: Fever noted over interval.  T-max 100.7 on 10/22/2019 at 2101.  No antibiotics.  12/17: No fevers.  Patient in some pain otherwise no interval changes  12/18: Patient's wife is at bedside.  Peer-to-peer declined yesterday.  Explained this to the patient's wife.  She expressed understanding.  No acute status changes for patient  12/19: Patient's wife at bedside.  Transition of care team spoke with her yesterday.  Case has been escalated to senior leadership.  No acute status changes per patient.  12/20: Patient in some pain this morning otherwise no acute status changes.  Patient's wife is at bedside.  Social Youth worker agreed to speak to the patient's wife in the room with myself.  Explained that that insurance would not cover a stay at a skilled nursing facility despite our best efforts.  Patient's wife was understandably upset but understood.  Agreed to plan to take patient home and accept home health services.  12/21: Patient seen and examined.  Pain well controlled.  Wife at bedside today.  She is willing and able to take  patient home.  Home health services have been ordered and are pending at the time of this note.  Assessment & Plan:   Principal Problem:   Intra-abdominal infection Active Problems:   Sepsis ([place])   Essential hypertension   Prostate cancer ([place])   Urinary obstruction   Malignant neoplasm of urinary bladder (H[facility])   Pressure injury of skin   S/P partial resection of colon   Intra-abdominal abscess ([facility])  Fever:- resolved Unexplained etiology Resolve spontaneously without initiation of antibiotic Repeat blood cultures negative to date No antibiotics indicated  Sepsis secondary to abdominal wall abscess/significant leukocytosis:  He has completed IV antibiotics.  No further antibiotics at this time  Sinus tachycardia: This is intermittent and asymptomatic.  This is likely due to underlying abdominal wall abscess/malignancy.   Metastatic bladder cancer:  Prognosis is poor but patient and his wife still want to continue radiation therapy.  They are not ready for hospice.    Acute on chronic leukocytosis: Likely worsened by infection  Hypokalemia and hypomagnesemia: Improved.   Bilateral nephrostomy tubes: Continue nephrostomy care per nursing.  Stage I sacral decubitus ulcer: This was present on admission.  Continue local wound care.  Functional decline: Patient has had a very extensive and complicated disease process since diagnosis of bladder cancer in 2018.  He currently has bilateral nephrostomy tubes, diverting colostomy, ostomy over abdominal wall abscess, bedbound.    Performed peer-to-peer with Market researcher of [facility] on 10/24/2019.  Spoke with Dr. Nat Christen.  Peer-to-peer was declined.  10/27/2019: Lengthy conversation between myself, patient's wife, Scientist, water quality, Agricultural consultant.  Explained that despite her  efforts the patient's insurance company would not cover stay in skilled nursing facility.  The patient's wife was  understandably upset though is understanding of the situation.  We had offered home with home health services which the patient's wife had initially refused however later agreed to the patient could be taken to his granddaughter's house where there is a room that has the capacity to handle a hospital bed.  We explained that this may be the best option to give him the care that he needs and minimize the likelihood return to the hospital.  We again offered hospice/palliative care, which the wife refused.    10/28/19: Home health services approval is pending at this time.  Social work aware.  Patient medically stable for discharge.  Will place discharge orders once confirmation of home health is received.  DVT prophylaxis: Lovenox Code Status: DNR Family Communication: spoke to wife at bedside 12/21 Disposition Plan: Home health, anticipate 10/28/2019  Consultants:   Pallative care  Procedures:   none  Antimicrobials:  None currently   Subjective: Seen and examined No acute events overnight Wife at bedside  Objective: Vitals:   10/27/19 0556 10/27/19 1223 10/27/19 2049 10/28/19 0644  BP:  115/65 111/65 109/63  Pulse:  (!) 104 (!) 110 (!) 106  Resp:  16 18 20   Temp: 97.6 F (36.4 C) 97.8 F (36.6 C) 98.8 F (37.1 C) (!) 97.5 F (36.4 C)  TempSrc: Oral Oral Oral Oral  SpO2:  96% 98% 97%  Weight:      Height:        Intake/Output Summary (Last 24 hours) at 10/28/2019 1219 Last data filed at 10/28/2019 0900 Gross per 24 hour  Intake 605 ml  Output 905 ml  Net -300 ml   Filed Weights   10/05/19 1418 10/05/19 2139 10/23/19 1018  Weight: 65.8 kg 66.6 kg 67.2 kg    Examination:  General exam:Appears calm and comfortable  Respiratory system: Clear to auscultation. Respiratory effort normal. Cardiovascular system:S1 &S2 heard, RRR. No JVD, murmurs, rubs, gallops or clicks. No pedal edema. Gastrointestinal system:Abdomenwith colostomy bag no output no signs of  infection,post I&D wound on abdominal wall no evidence of bleeding or exuding purulence through dressing at this point time, did not take dressing down Central nervous system:Alert, Extremities:Warm well perfused  skin:Lesions abdominal wall as above otherwise no rashes, lesions or ulcers Psychiatry:Judgement and insightare poor. Mood & affectflat    Data Reviewed: I have personally reviewed following labs and imaging studies  CBC: No results for input(s): WBC, NEUTROABS, HGB, HCT, MCV, PLT in the last 168 hours. Basic Metabolic Panel: Recent Labs  Lab 10/22/19 0420 10/24/19 0527 10/26/19 0449 10/27/19 0656 10/28/19 0423  NA 138 138 139  --  143  K 3.9 3.9 3.5 4.1 3.7  CL 98 99 100  --  105  CO2 28 26 27   --  28  GLUCOSE 110* 104* 99  --  108*  BUN 18 22 20   --  25*  CREATININE 0.69 0.80 0.75  --  0.84  CALCIUM 8.6* 8.6* 8.5*  --  8.8*  MG 1.9 1.9  --   --  2.3   GFR: Estimated Creatinine Clearance: 64.4 mL/min (by C-G formula based on SCr of 0.84 mg/dL). Liver Function Tests: No results for input(s): AST, ALT, ALKPHOS, BILITOT, PROT, ALBUMIN in the last 168 hours. No results for input(s): LIPASE, AMYLASE in the last 168 hours. No results for input(s): AMMONIA in the last 168 hours. Coagulation  Profile: No results for input(s): INR, PROTIME in the last 168 hours. Cardiac Enzymes: No results for input(s): CKTOTAL, CKMB, CKMBINDEX, TROPONINI in the last 168 hours. BNP (last 3 results) No results for input(s): PROBNP in the last 8760 hours. HbA1C: No results for input(s): HGBA1C in the last 72 hours. CBG: No results for input(s): GLUCAP in the last 168 hours. Lipid Profile: No results for input(s): CHOL, HDL, LDLCALC, TRIG, CHOLHDL, LDLDIRECT in the last 72 hours. Thyroid Function Tests: No results for input(s): TSH, T4TOTAL, FREET4, T3FREE, THYROIDAB in the last 72 hours. Anemia Panel: No results for input(s): VITAMINB12, FOLATE, FERRITIN, TIBC, IRON,  RETICCTPCT in the last 72 hours. Sepsis Labs: No results for input(s): PROCALCITON, LATICACIDVEN in the last 168 hours.  Recent Results (from the past 240 hour(s))  CULTURE, BLOOD (ROUTINE X 2) w Reflex to ID Panel     Status: None   Collection Time: 10/23/19  8:07 AM   Specimen: BLOOD  Result Value Ref Range Status   Specimen Description   Final    BLOOD LEFT ANTECUBITAL Performed at [facility], [place] [address], [place], [place] [address]    Special Requests   Final    BOTTLES DRAWN AEROBIC AND ANAEROBIC Blood Culture adequate volume   Culture   Final    NO GROWTH 5 DAYS Performed at [facility], [address] [place], [place] [address]    Report Status 10/28/2019 FINAL  Final  CULTURE, BLOOD (ROUTINE X 2) w Reflex to ID Panel     Status: None   Collection Time: 10/23/19  8:07 AM   Specimen: BLOOD  Result Value Ref Range Status   Specimen Description BLOOD RIGHT [facility]  Final   Special Requests   Final    BOTTLES DRAWN AEROBIC AND ANAEROBIC Blood Culture adequate volume   Culture   Final    NO GROWTH 5 DAYS Performed at [facility], [address], [place], [place] [address]    Report Status 10/28/2019 FINAL  Final         Radiology Studies: No results found.      Scheduled Meds: . Chlorhexidine Gluconate Cloth  6 each Topical Daily  . enoxaparin (LOVENOX) injection  40 mg Subcutaneous Q24H  . feeding supplement (ENSURE ENLIVE)  237 mL Oral TID BM  . mirabegron ER  50 mg Oral Daily  . multivitamin with minerals  1 tablet Oral Daily  . sodium chloride flush  5 mL Intravenous Q8H   Continuous Infusions: . sodium chloride 500 mL (10/19/19 1044)     LOS: 23 days    Time spent: 35 minutes    Sidney Ace, MD [facility] Pager [phone]  If 7PM-7AM, please contact night-coverage [email or web address] Password [email or web address] 10/28/2019, 12:19 PM

## 2019-10-28 NOTE — Care Management Important Message (Signed)
Important Message  Patient Details  Name: Vincent Poole MRN: [id number] Date of Birth: 1935/07/02   Medicare Important Message Given:  Yes     Dannette Barbara 10/28/2019, 11:41 AM

## 2019-10-28 NOTE — Progress Notes (Signed)
Patient's wife was told by the delivery company that the bed and chair would not be delivered until tomorrow.  Brad with [employer] came in and talked to the patient and wife and his company and then he asked [place] to see if the doctor would hold the discharge.  I contacted Dr. Priscella Mann and he said it was ok to hold the discharge until tomorrow.

## 2019-10-28 NOTE — TOC Transition Note (Signed)
Transition of Care [facility]) - CM/SW Discharge Note   Patient Details  Name: Vincent Poole MRN: [id number] Date of Birth: May 29, 1935  Transition of [facility]) CM/SW Contact:  Ross Ludwig, LCSW Phone Number: 10/28/2019, 5:32 PM   Clinical Narrative:     Patient will be discharging back home with home health.  CSW spoke to patient's wife, and confirmed that patient would like a hoyer lift and a hospital bed to be delivered to patient's house.  CSW contacted Brad at [employer] [place] [employer], and they will deliver the hospital bed and hoyer lift today.  Patient's wife stated that patient will be going to her granddaughter Whitney's  house at [address], [place], [place] [address] [phone].  Patient's wife stated that she has plenty of help in the home, CSW gave her a list of private care agencies that she can pay for if she wants extra in home care.  Patient's wife discussed that she has been spending a lot of their retirement funds to help pay for his care.  CSW informed her that she may want to consider looking into Medicaid assistance.  CSW provided patient's wife with Medicaid contact information for DSS.  Patient's wife is requesting EMS transport back home due to patient unable to ambulate on his own.   Final next level of care: [place] Barriers to Discharge: Barriers Resolved   Patient Goals and CMS Choice Patient states their goals for this hospitalization and ongoing recovery are:: To return back home with home health CMS Medicare.gov Compare Post Acute Care list provided to:: Patient Choice offered to / list presented to : Patient  Discharge Placement                Patient to be transferred to facility by: [facility]      Discharge Plan and Services                DME Arranged: Hospital bed, Other see comment(Hoyer Lift) DME Agency: [employer] Date DME Agency Contacted: 10/28/19 Time DME Agency Contacted: 1200 Representative  spoke with at DME Agency: [place]: PT, OT, Nurse's Aide [place] Agency: [place] Date [place]: 10/28/19 Time [place]: 1200 Representative spoke with at [place]: [place]) Interventions     Readmission Risk Interventions Readmission Risk Prevention Plan 10/10/2019 09/27/2019 04/28/2019  Transportation Screening Complete Complete Complete  PCP or Specialist Appt within 3-5 Days - - Not Complete  [place] or [place] - - Complete  Social Work Consult for [place] Planning/Counseling - - Patient refused  Palliative Care Screening - - -  Medication Review Press photographer) Complete Complete Complete  PCP or Specialist appointment within 3-5 days of discharge - Complete -  [place] or [place] Patient refused (No Data) -  SW Recovery Care/Counseling Consult Complete Complete -  Palliative Care Screening - Not Applicable -  [place] - Not Applicable -  Some recent data might be hidden

## 2019-10-29 ENCOUNTER — Ambulatory Visit: Payer: Medicare Other

## 2019-10-29 NOTE — Discharge Summary (Signed)
Physician Discharge Summary  Vincent Poole WIO:[id number] DOB: 1934-12-08 DOA: 10/05/2019  PCP: [name], MD  Admit date: 10/05/2019 Discharge date: 10/29/2019  Admitted From: Home Disposition: Home with home health  Recommendations for Outpatient Follow-up:  1. Follow up with PCP in 1-2 weeks   Home Health: Yes Equipment/Devices: Hospital bed, [facility] lift  Discharge Condition: Stable CODE STATUS: DNR Diet recommendation: Regular  Brief/Interim Summary: Patient is an 84 year old male with medical history of locally metastatic invasive bladder carcinoma currently on radiation therapy, progressive disease despite multiple lines of treatment.  Hospitalized since 10/05/2019 for management of area of abdominal wall abscess with associated fistulous tract with the latter.  Patient is completed a full course of broad-spectrum IV antibiotics.  Infectious disease consulted at that time who with recommendations for antibiotic discontinuation.  The patient has been receiving radiation therapy with the hospital.  Palliative care was consulted earlier in the admission and despite their efforts the family is unwilling or unable to agree to any de-escalation of care at this time.  12/16: Fever noted over interval.  T-max 100.7 on 10/22/2019 at 2101.  No antibiotics.  12/17: No fevers.  Patient in some pain otherwise no interval changes  12/18: Patient's wife is at bedside.  Peer-to-peer declined yesterday.  Explained this to the patient's wife.  She expressed understanding.  No acute status changes for patient  12/19: Patient's wife at bedside.  Transition of care team spoke with her yesterday.  Case has been escalated to senior leadership.  No acute status changes per patient.  12/20: Patient in some pain this morning otherwise no acute status changes.  Patient's wife is at bedside.  Social Youth worker agreed to speak to the patient's wife in the room with  myself.  Explained that that insurance would not cover a stay at a skilled nursing facility despite our best efforts.  Patient's wife was understandably upset but understood.  Agreed to plan to take patient home and accept home health services.  12/21: Patient seen and examined.  Pain well controlled.  Wife at bedside today.  She is willing and able to take patient home.  Home health services have been ordered and are pending at the time of this note.  12/22: Hospital bed however left to be delivered to bedside today.  Patient be transported home with home health services after confirmation of delivery of devices  Discharge Diagnoses:  Principal Problem:   Intra-abdominal infection Active Problems:   Sepsis ([place])   Essential hypertension   Prostate cancer ([place])   Urinary obstruction   Malignant neoplasm of urinary bladder (HCC)   Pressure injury of skin   S/P partial resection of colon   Intra-abdominal abscess (HCC)  Fever:- resolved Unexplained etiology Resolve spontaneously without initiation of antibiotic Repeat blood cultures negative to date No antibiotics indicated  Sepsis secondary to abdominal wall abscess/significant leukocytosis: He has completed IV antibiotics.  No further antibiotics at this time  Sinus tachycardia:This is intermittent and asymptomatic.This is likely due to underlying abdominal wall abscess/malignancy.   Metastatic bladder cancer: Prognosis is poor but patient and his wife still want to continue radiation therapy. They are not ready for hospice.   Acute on chronic leukocytosis: Likely worsened by infection  Hypokalemia and hypomagnesemia: Improved.   Bilateral nephrostomy tubes: Continue nephrostomy care per nursing.  Stage I sacral decubitus ulcer: This was present on admission. Continue local wound care.  Functional decline: Patient has had a very extensive and complicated disease process since  diagnosis of bladder cancer in  2018.  He currently has bilateral nephrostomy tubes, diverting colostomy, ostomy over abdominal wall abscess, bedbound.    Performed peer-to-peer with Market researcher of [facility] on 10/24/2019.  Spoke with Dr. Nat Christen.  Peer-to-peer was declined.  10/27/2019: Lengthy conversation between myself, patient's wife, Scientist, water quality, Agricultural consultant.  Explained that despite her efforts the patient's insurance company would not cover stay in skilled nursing facility.  The patient's wife was understandably upset though is understanding of the situation.  We had offered home with home health services which the patient's wife had initially refused however later agreed to the patient could be taken to his granddaughter's house where there is a room that has the capacity to handle a hospital bed.  We explained that this may be the best option to give him the care that he needs and minimize the likelihood return to the hospital.  We again offered hospice/palliative care, which the wife refused.     Discharge Instructions  Discharge Instructions    Diet - low sodium heart healthy   Complete by: As directed    Increase activity slowly   Complete by: As directed      Allergies as of 10/29/2019   No Known Allergies     Medication List    TAKE these medications   acetaminophen 500 MG tablet Commonly known as: TYLENOL Take 1,000 mg by mouth every 6 (six) hours as needed for moderate pain or headache.   cetirizine 10 MG tablet Commonly known as: ZYRTEC Take 10 mg by mouth daily as needed for allergies.   dexamethasone 4 MG tablet Commonly known as: DECADRON Take 2 tablets (8 mg total) by mouth daily. Start the day after chemotherapy for 2 days.   DRY EYE RELIEF DROPS OP Apply 1 drop to eye daily as needed.   feeding supplement (ENSURE ENLIVE) Liqd Take 237 mLs by mouth 2 (two) times daily between meals.   ferrous sulfate 325 (65 FE) MG tablet Take 1 tablet (325  mg total) by mouth 2 (two) times daily with a meal.   lidocaine-prilocaine cream Commonly known as: EMLA Apply 1 application topically as needed.   multivitamin with minerals Tabs tablet Take 1 tablet by mouth daily.   Myrbetriq 50 MG Tb24 tablet Generic drug: mirabegron ER TAKE 1 TABLET BY MOUTH DAILY What changed: how much to take   nystatin powder Commonly known as: Nyamyc Apply topically 2 (two) times daily. What changed:   how much to take  when to take this  reasons to take this   ondansetron 8 MG tablet Commonly known as: Zofran Take 1 tablet (8 mg total) by mouth 2 (two) times daily as needed for refractory nausea / vomiting. Start on day 3 after chemo.   oxyCODONE 5 MG immediate release tablet Commonly known as: Oxy IR/ROXICODONE Take 1 tablet (5 mg total) by mouth every 4 (four) hours as needed for severe pain.   polyethylene glycol powder 17 GM/SCOOP powder Commonly known as: MiraLax Take 17 g by mouth daily as needed. Can increase to 3 times a day as needed for constipation but hold medication if has diarrhea   prochlorperazine 10 MG tablet Commonly known as: COMPAZINE Take 1 tablet (10 mg total) by mouth every 6 (six) hours as needed (Nausea or vomiting).   Super Cranberry/Vitamin D3 4200-500 MG-UNIT Caps Generic drug: Cranberry-Cholecalciferol Take 1 capsule by mouth 2 (two) times daily.   vitamin B-12 500 MCG  tablet Commonly known as: CYANOCOBALAMIN Take 500 mcg by mouth daily.   zinc oxide 20 % ointment Apply 1 application topically as needed for irritation.      Follow-up Information    [name], MD. Go on 10/30/2019.   Specialty: Family Medicine Why: 1pm appointment Contact information: [address] [place] [address] [phone]        Sindy Guadeloupe, MD.   Specialty: Oncology Why: Follow up as directed Contact information: [address] [place] [address] [phone]           No Known Allergies  Consultations:  General surgery  Infectious disease   Procedures/Studies: CT ABDOMEN PELVIS W CONTRAST  Result Date: 10/13/2019 CLINICAL DATA:  Abdominal pain, persistent leukocytosis, suspected abscess, re-evaluate anterior abdominal abscess; history of prostate cancer, locally invasive bladder cancer, hypertension, GERD EXAM: CT ABDOMEN AND PELVIS WITH CONTRAST TECHNIQUE: Multidetector CT imaging of the abdomen and pelvis was performed using the standard protocol following bolus administration of intravenous contrast. Sagittal and coronal MPR images reconstructed from axial data set. CONTRAST:  127mL OMNIPAQUE IOHEXOL 300 MG/ML SOLN IV. No oral contrast. COMPARISON:  10/05/2019 FINDINGS: Lower chest: Bibasilar small pleural effusions and compressive atelectasis of the lower lobes. Hepatobiliary: Gallbladder surgically absent.  Liver unremarkable. Pancreas: Atrophic without mass Spleen: Normal appearance Adrenals/Urinary Tract: Adrenal glands normal appearance. BILATERAL nephrostomy tubes. Mild BILATERAL ureteral dilatation greater on LEFT. Small BILATERAL renal cysts. Large enhancing mass arising from the bladder consistent with bladder neoplasm with invasion of the pre vesicle space and anterior abdominal wall, mass overall measuring 14.3 x 8.2 x 10.0 cm. Decreased air within the subcutaneous loculation, with this collection now clearly appearing to communicate with the bladder lumen rather than a separate loculated collection. Stomach/Bowel: Loop colostomy mid abdomen. Slightly increased stool in RIGHT colon and within rectum. Small bowel loops unremarkable. Stomach normal appearance. Normal appendix. Vascular/Lymphatic: Atherosclerotic calcifications aorta, iliac arteries, femoral arteries. Aorta normal caliber. No adenopathy. Reproductive: Minimal prostatic enlargement. Several clips within the prostate gland with suspect prior TURP. Other: No free air or free fluid.   No hernia. Musculoskeletal: Bones demineralized. Advanced degenerative changes of both hips greater on RIGHT with prior proximal RIGHT femoral ORIF. IMPRESSION: Slight increase in size of bladder neoplasm since previous exam with evidence of invasion of the anterior abdominal wall. The previously identified abscess collection anterior to the bladder appears to communicate with the bladder lumen and demonstrates less air on the current study, otherwise unchanged. BILATERAL distal ureteral obstruction with BILATERAL nephrostomy tubes. Small BILATERAL pleural effusions and bibasilar atelectasis. Electronically Signed   By: Lavonia Dana M.D.   On: 10/13/2019 15:47   CT ABDOMEN PELVIS W CONTRAST  Result Date: 10/05/2019 CLINICAL DATA:  84 year old male with history of lower abdominal pain from an abscess. EXAM: CT ABDOMEN AND PELVIS WITH CONTRAST TECHNIQUE: Multidetector CT imaging of the abdomen and pelvis was performed using the standard protocol following bolus administration of intravenous contrast. CONTRAST:  123mL OMNIPAQUE IOHEXOL 300 MG/ML  SOLN COMPARISON:  CT the abdomen and pelvis 09/25/2019. FINDINGS: Lower chest: Small right pleural effusion lying dependently. Areas of subsegmental atelectasis in the lower lobes of the lungs bilaterally. Atherosclerotic calcifications in the descending thoracic aorta as well as the left anterior descending, left circumflex and right coronary arteries. Hepatobiliary: No suspicious cystic or solid hepatic lesions. No intra or extrahepatic biliary ductal dilatation. Status post cholecystectomy. Pancreas: No pancreatic mass. No pancreatic ductal dilatation. No pancreatic or peripancreatic  fluid collections or inflammatory changes. Spleen: Unremarkable. Adrenals/Urinary Tract: Percutaneous nephrostomy tubes are noted with tips reformed in the renal collecting systems bilaterally. 2.8 cm low-attenuation lesion in the anterior aspect of the interpolar region of the right  kidney, compatible with a simple cyst. Subcentimeter low-attenuation lesion in the anterior aspect of the lower pole the left kidney, too small to characterize, but statistically likely to represent a tiny cyst. No hydroureteronephrosis. Urinary bladder is enlarged measuring approximately 8.6 x 14.5 x 8.8 cm and grossly distorted with extensive multifocal mural thickening and widespread areas of internal soft tissue thickening and enhancing soft tissue, with a combination of fluid and gas within the bladder, concerning for progressively enlarging bladder neoplasm. This appears to extend through the lower anterior abdominal wall where there is a small gas and fluid collection in the subcutaneous fat immediately superficial to the anterior abdominal wall musculature measuring 8.0 x 1.7 x 5.0 cm (axial image 68 of series 2 and sagittal image 64 of series 6). Bilateral adrenal glands are normal in appearance. Stomach/Bowel: Normal appearance of the stomach. No pathologic dilatation of small bowel or colon. A few scattered colonic diverticulae are noted, without surrounding inflammatory changes to suggest an acute diverticulitis at this time. Mid transverse colon loop colostomy. Normal appendix. Vascular/Lymphatic: Aortic atherosclerosis, without evidence of aneurysm or dissection in the abdominal or pelvic vasculature. No lymphadenopathy noted in the abdomen or pelvis. Reproductive: Prostate gland is completely obscured by the adjacent bladder mass, likely from direct invasion. Seminal vesicles are not confidently identified. Other: No significant volume of ascites.  No pneumoperitoneum. Musculoskeletal: There are no aggressive appearing lytic or blastic lesions noted in the visualized portions of the skeleton. IMPRESSION: 1. Progressively enlarging heterogeneously enhancing bladder mass which now appears to have eroded through the inferior aspect of the anterior abdominal wall musculature, with small superficial  abscess just deep to the skin surface in the low anatomic pelvis. 2. Bilateral nephrostomy tubes are stable in position. No hydroureteronephrosis. 3. Small right pleural effusion lying dependently. 4. Aortic atherosclerosis, in addition to least 3 vessel coronary artery disease. 5. Additional incidental findings, as above, similar to prior studies. Electronically Signed   By: Vinnie Langton M.D.   On: 10/05/2019 18:10    Subjective:   Discharge Exam: Vitals:   10/28/19 2017 10/29/19 0559  BP: 119/64 116/65  Pulse: (!) 109 (!) 110  Resp: 18 18  Temp: 98.1 F (36.7 C) 97.8 F (36.6 C)  SpO2: 95% 96%   Vitals:   10/28/19 0644 10/28/19 1235 10/28/19 2017 10/29/19 0559  BP: 109/63 122/64 119/64 116/65  Pulse: (!) 106 (!) 106 (!) 109 (!) 110  Resp: 20 15 18 18   Temp: (!) 97.5 F (36.4 C) 97.6 F (36.4 C) 98.1 F (36.7 C) 97.8 F (36.6 C)  TempSrc: Oral Oral    SpO2: 97% 97% 95% 96%  Weight:      Height:       General exam:Appears calm and comfortable  Respiratory system: Clear to auscultation. Respiratory effort normal. Cardiovascular system:S1 &S2 heard, RRR. No JVD, murmurs, rubs, gallops or clicks. No pedal edema. Gastrointestinal system:Abdomenwith colostomy bag no output no signs of infection,post I&D wound on abdominal wall no evidence of bleeding or exuding purulence through dressing at this point time, did not take dressing down Central nervous system:Alert, Extremities:Warm well perfused  skin:Lesions abdominal wall as above otherwise no rashes, lesions or ulcers Psychiatry:Judgement and insightare poor. Mood & affectflat   The results of significant diagnostics  from this hospitalization (including imaging, microbiology, ancillary and laboratory) are listed below for reference.     Microbiology: Recent Results (from the past 240 hour(s))  CULTURE, BLOOD (ROUTINE X 2) w Reflex to ID Panel     Status: None   Collection Time: 10/23/19  8:07 AM    Specimen: BLOOD  Result Value Ref Range Status   Specimen Description   Final    BLOOD LEFT ANTECUBITAL Performed at [facility], [place] [address] [place], [place] [address]    Special Requests   Final    BOTTLES DRAWN AEROBIC AND ANAEROBIC Blood Culture adequate volume   Culture   Final    NO GROWTH 5 DAYS Performed at [facility], [address] [place], [place] [address]    Report Status 10/28/2019 FINAL  Final  CULTURE, BLOOD (ROUTINE X 2) w Reflex to ID Panel     Status: None   Collection Time: 10/23/19  8:07 AM   Specimen: BLOOD  Result Value Ref Range Status   Specimen Description BLOOD RIGHT [facility]  Final   Special Requests   Final    BOTTLES DRAWN AEROBIC AND ANAEROBIC Blood Culture adequate volume   Culture   Final    NO GROWTH 5 DAYS Performed at [facility], [address], [place],  [address]    Report Status 10/28/2019 FINAL  Final     Labs: BNP (last 3 results) No results for input(s): BNP in the last 8760 hours. Basic Metabolic Panel: Recent Labs  Lab 10/24/19 0527 10/26/19 0449 10/27/19 0656 10/28/19 0423  NA 138 139  --  143  K 3.9 3.5 4.1 3.7  CL 99 100  --  105  CO2 26 27  --  28  GLUCOSE 104* 99  --  108*  BUN 22 20  --  25*  CREATININE 0.80 0.75  --  0.84  CALCIUM 8.6* 8.5*  --  8.8*  MG 1.9  --   --  2.3   Liver Function Tests: No results for input(s): AST, ALT, ALKPHOS, BILITOT, PROT, ALBUMIN in the last 168 hours. No results for input(s): LIPASE, AMYLASE in the last 168 hours. No results for input(s): AMMONIA in the last 168 hours. CBC: No results for input(s): WBC, NEUTROABS, HGB, HCT, MCV, PLT in the last 168 hours. Cardiac Enzymes: No results for input(s): CKTOTAL, CKMB, CKMBINDEX, TROPONINI in the last 168 hours. BNP: Invalid input(s): POCBNP CBG: No results for input(s): GLUCAP in the last 168 hours. D-Dimer No results for input(s): DDIMER in the last 72 hours. Hgb A1c No results for input(s):  HGBA1C in the last 72 hours. Lipid Profile No results for input(s): CHOL, HDL, LDLCALC, TRIG, CHOLHDL, LDLDIRECT in the last 72 hours. Thyroid function studies No results for input(s): TSH, T4TOTAL, T3FREE, THYROIDAB in the last 72 hours.  Invalid input(s): FREET3 Anemia work up No results for input(s): VITAMINB12, FOLATE, FERRITIN, TIBC, IRON, RETICCTPCT in the last 72 hours. Urinalysis    Component Value Date/Time   COLORURINE AMBER (A) 10/05/2019 1618   APPEARANCEUR TURBID (A) 10/05/2019 1618   APPEARANCEUR Cloudy (A) 10/23/2018 1327   LABSPEC 1.024 10/05/2019 1618   PHURINE 5.0 10/05/2019 1618   GLUCOSEU NEGATIVE 10/05/2019 1618   HGBUR NEGATIVE 10/05/2019 1618   BILIRUBINUR NEGATIVE 10/05/2019 1618   BILIRUBINUR Negative 10/23/2018 1327   KETONESUR 5 (A) 10/05/2019 1618   PROTEINUR 100 (A) 10/05/2019 1618   NITRITE NEGATIVE 10/05/2019 1618   LEUKOCYTESUR LARGE (A)  10/05/2019 1618   Sepsis Labs Invalid input(s): PROCALCITONIN,  WBC,  LACTICIDVEN Microbiology Recent Results (from the past 240 hour(s))  CULTURE, BLOOD (ROUTINE X 2) w Reflex to ID Panel     Status: None   Collection Time: 10/23/19  8:07 AM   Specimen: BLOOD  Result Value Ref Range Status   Specimen Description   Final    BLOOD LEFT ANTECUBITAL Performed at [facility], [place] [address], [place], [place] [address]    Special Requests   Final    BOTTLES DRAWN AEROBIC AND ANAEROBIC Blood Culture adequate volume   Culture   Final    NO GROWTH 5 DAYS Performed at [facility], [address] [place], [place] [address]    Report Status 10/28/2019 FINAL  Final  CULTURE, BLOOD (ROUTINE X 2) w Reflex to ID Panel     Status: None   Collection Time: 10/23/19  8:07 AM   Specimen: BLOOD  Result Value Ref Range Status   Specimen Description BLOOD RIGHT [facility]  Final   Special Requests   Final    BOTTLES DRAWN AEROBIC AND ANAEROBIC Blood Culture adequate volume   Culture   Final    NO GROWTH 5  DAYS Performed at [facility], [address] [place], [place] [address]    Report Status 10/28/2019 FINAL  Final     Time coordinating discharge: Over 30 minutes  SIGNED:   Sidney Ace, MD  [facility] 10/29/2019, 1:07 PM Pager [phone]  If 7PM-7AM, please contact night-coverage [email or web address] Password [email or web address]

## 2019-10-29 NOTE — Progress Notes (Signed)
Patient cleared for discharge to  Home.       Education complete. AVS printed. Discharge instructions given. All questions answered for patient clarification.    Prescriptions given, pharmacy verified.        Discharged to home, Via EMS.

## 2019-10-29 NOTE — TOC Transition Note (Signed)
Transition of Care [facility]) - CM/SW Discharge Note   Patient Details  Name: Vincent Poole MRN: [id number] Date of Birth: July 04, 1935  [facility] of [facility] CM/SW Contact:  Beverly Sessions, RN Phone Number: 10/29/2019, 1:43 PM   Clinical Narrative:    Contacted by wife to let me know the equipment is being delivered now.   Bedside RN notified to call EMS   Final next level of care: [place] Barriers to Discharge: No Barriers Identified   Patient Goals and CMS Choice Patient states their goals for this hospitalization and ongoing recovery are:: To return back home with home health CMS Medicare.gov Compare Post Acute Care list provided to:: Patient Choice offered to / list presented to : Patient  Discharge Placement                Patient to be transferred to facility by: [facility]      Discharge Plan and Services                DME Arranged: Hospital bed, Other see comment([place] Lift) DME Agency: [employer] Date DME Agency Contacted: 10/28/19 Time DME Agency Contacted: 1200 Representative spoke with at DME Agency: [place]: PT, OT, Nurse's Aide [place] Agency: [place] Date [place]: 10/28/19 Time [place]: 1200 Representative spoke with at [place]: [place] ([place]) Interventions     Readmission Risk Interventions Readmission Risk Prevention Plan 10/29/2019 10/10/2019 09/27/2019  Transportation Screening Complete Complete Complete  PCP or Specialist Appt within 3-5 Days - - -  HRI or [place] for [place] - - -  Medication Review Press photographer) Complete Complete Complete  PCP or Specialist appointment within 3-5 days of discharge Complete - Complete  HRI or [place] Complete Patient refused (No Data)  SW Recovery Care/Counseling Consult -  Complete Complete  Palliative Care Screening Patient Refused - Not [place] Not Applicable - Not Applicable  Some recent data might be hidden

## 2019-10-29 NOTE — Plan of Care (Signed)
Patient discharging to home.  Family education completed 10/28/2019 with wife and reviewed with patient today. Awaiting EMS transport.

## 2019-10-29 NOTE — TOC Transition Note (Signed)
Transition of Care [facility]) - CM/SW Discharge Note   Patient Details  Name: Vincent Poole MRN: [id number] Date of Birth: 01/02/1935  Transition of [facility]) CM/SW Contact:  Beverly Sessions, RN Phone Number: 10/29/2019, 10:45 AM   Clinical Narrative:    Patient to discharge home today with home health through [place] notified Malachy Mood with [facility] of discharge  EMS packet  and signed DNR on chart.  Bedside RN notified  Confirmed with family that equipment has not been delivered yet.  RNCM reached out to [facility] with [employer] [facility].  Once confirmed equipment is at the home EMS to be called    Final next level of care: [place] Barriers to Discharge: No Barriers Identified   Patient Goals and CMS Choice Patient states their goals for this hospitalization and ongoing recovery are:: To return back home with home health CMS Medicare.gov Compare Post Acute Care list provided to:: Patient Choice offered to / list presented to : Patient  Discharge Placement                Patient to be transferred to facility by: [facility]      Discharge Plan and Services                DME Arranged: Hospital bed, Other see comment(Hoyer Lift) DME Agency: [employer] Date DME Agency Contacted: 10/28/19 Time DME Agency Contacted: 1200 Representative spoke with at DME Agency: [place]: PT, OT, Nurse's Aide [place] Agency: [place] Date [place]: 10/28/19 Time [place]: 1200 Representative spoke with at [place]: [place] ([place]) Interventions     Readmission Risk Interventions Readmission Risk Prevention Plan 10/10/2019 09/27/2019 04/28/2019  Transportation Screening Complete Complete Complete  PCP or Specialist Appt within 3-5 Days - - Not Complete  [place] or [place] - - Complete  Social Work Consult for [place] Planning/Counseling - - Patient refused   Palliative Care Screening - - -  Medication Review Press photographer) Complete Complete Complete  PCP or Specialist appointment within 3-5 days of discharge - Complete -  [place] or [place] Patient refused (No Data) -  SW Recovery Care/Counseling Consult Complete Complete -  Palliative Care Screening - Not Applicable -  [place] - Not Applicable -  Some recent data might be hidden

## 2019-10-30 ENCOUNTER — Ambulatory Visit: Payer: Medicare Other

## 2019-11-05 ENCOUNTER — Other Ambulatory Visit: Payer: Self-pay | Admitting: Oncology

## 2019-11-11 ENCOUNTER — Inpatient Hospital Stay (HOSPITAL_BASED_OUTPATIENT_CLINIC_OR_DEPARTMENT_OTHER): Payer: Medicare Other | Admitting: Oncology

## 2019-11-11 ENCOUNTER — Encounter: Payer: Self-pay | Admitting: Oncology

## 2019-11-11 ENCOUNTER — Other Ambulatory Visit: Payer: Self-pay | Admitting: Radiology

## 2019-11-11 ENCOUNTER — Other Ambulatory Visit: Payer: Self-pay

## 2019-11-11 ENCOUNTER — Inpatient Hospital Stay: Payer: Medicare Other | Attending: Oncology

## 2019-11-11 ENCOUNTER — Inpatient Hospital Stay: Payer: Medicare Other

## 2019-11-11 VITALS — BP 96/75 | HR 117 | Temp 97.2°F | Wt 143.0 lb

## 2019-11-11 DIAGNOSIS — I959 Hypotension, unspecified: Secondary | ICD-10-CM | POA: Diagnosis not present

## 2019-11-11 DIAGNOSIS — R000 Tachycardia, unspecified: Secondary | ICD-10-CM | POA: Insufficient documentation

## 2019-11-11 DIAGNOSIS — C679 Malignant neoplasm of bladder, unspecified: Secondary | ICD-10-CM | POA: Diagnosis present

## 2019-11-11 DIAGNOSIS — D72823 Leukemoid reaction: Secondary | ICD-10-CM

## 2019-11-11 DIAGNOSIS — I10 Essential (primary) hypertension: Secondary | ICD-10-CM | POA: Diagnosis not present

## 2019-11-11 DIAGNOSIS — Z936 Other artificial openings of urinary tract status: Secondary | ICD-10-CM | POA: Diagnosis not present

## 2019-11-11 DIAGNOSIS — Z923 Personal history of irradiation: Secondary | ICD-10-CM | POA: Diagnosis not present

## 2019-11-11 DIAGNOSIS — R54 Age-related physical debility: Secondary | ICD-10-CM | POA: Insufficient documentation

## 2019-11-11 DIAGNOSIS — K219 Gastro-esophageal reflux disease without esophagitis: Secondary | ICD-10-CM | POA: Diagnosis not present

## 2019-11-11 DIAGNOSIS — Z7952 Long term (current) use of systemic steroids: Secondary | ICD-10-CM | POA: Diagnosis not present

## 2019-11-11 DIAGNOSIS — Z7189 Other specified counseling: Secondary | ICD-10-CM | POA: Diagnosis not present

## 2019-11-11 DIAGNOSIS — R5383 Other fatigue: Secondary | ICD-10-CM | POA: Insufficient documentation

## 2019-11-11 DIAGNOSIS — Z79899 Other long term (current) drug therapy: Secondary | ICD-10-CM | POA: Diagnosis not present

## 2019-11-11 DIAGNOSIS — C61 Malignant neoplasm of prostate: Secondary | ICD-10-CM | POA: Diagnosis not present

## 2019-11-11 DIAGNOSIS — Z87440 Personal history of urinary (tract) infections: Secondary | ICD-10-CM | POA: Diagnosis not present

## 2019-11-11 DIAGNOSIS — R5381 Other malaise: Secondary | ICD-10-CM | POA: Insufficient documentation

## 2019-11-11 DIAGNOSIS — N1339 Other hydronephrosis: Secondary | ICD-10-CM

## 2019-11-11 DIAGNOSIS — Z933 Colostomy status: Secondary | ICD-10-CM | POA: Insufficient documentation

## 2019-11-11 DIAGNOSIS — Z95828 Presence of other vascular implants and grafts: Secondary | ICD-10-CM

## 2019-11-11 DIAGNOSIS — H9190 Unspecified hearing loss, unspecified ear: Secondary | ICD-10-CM | POA: Insufficient documentation

## 2019-11-11 DIAGNOSIS — R109 Unspecified abdominal pain: Secondary | ICD-10-CM | POA: Insufficient documentation

## 2019-11-11 DIAGNOSIS — C689 Malignant neoplasm of urinary organ, unspecified: Secondary | ICD-10-CM | POA: Diagnosis not present

## 2019-11-11 LAB — CBC WITH DIFFERENTIAL/PLATELET
Abs Immature Granulocytes: 0.45 10*3/uL — ABNORMAL HIGH (ref 0.00–0.07)
Basophils Absolute: 0.1 10*3/uL (ref 0.0–0.1)
Basophils Relative: 0 %
Eosinophils Absolute: 0.2 10*3/uL (ref 0.0–0.5)
Eosinophils Relative: 1 %
HCT: 36.9 % — ABNORMAL LOW (ref 39.0–52.0)
Hemoglobin: 11.5 g/dL — ABNORMAL LOW (ref 13.0–17.0)
Immature Granulocytes: 1 %
Lymphocytes Relative: 8 %
Lymphs Abs: 2.7 10*3/uL (ref 0.7–4.0)
MCH: 29.5 pg (ref 26.0–34.0)
MCHC: 31.2 g/dL (ref 30.0–36.0)
MCV: 94.6 fL (ref 80.0–100.0)
Monocytes Absolute: 1.5 10*3/uL — ABNORMAL HIGH (ref 0.1–1.0)
Monocytes Relative: 4 %
Neutro Abs: 29.7 10*3/uL — ABNORMAL HIGH (ref 1.7–7.7)
Neutrophils Relative %: 86 %
Platelets: 378 10*3/uL (ref 150–400)
RBC: 3.9 MIL/uL — ABNORMAL LOW (ref 4.22–5.81)
RDW: 17.7 % — ABNORMAL HIGH (ref 11.5–15.5)
WBC: 34.6 10*3/uL — ABNORMAL HIGH (ref 4.0–10.5)
nRBC: 0 % (ref 0.0–0.2)

## 2019-11-11 LAB — COMPREHENSIVE METABOLIC PANEL
ALT: 14 U/L (ref 0–44)
AST: 18 U/L (ref 15–41)
Albumin: 2.9 g/dL — ABNORMAL LOW (ref 3.5–5.0)
Alkaline Phosphatase: 92 U/L (ref 38–126)
Anion gap: 11 (ref 5–15)
BUN: 24 mg/dL — ABNORMAL HIGH (ref 8–23)
CO2: 24 mmol/L (ref 22–32)
Calcium: 9 mg/dL (ref 8.9–10.3)
Chloride: 101 mmol/L (ref 98–111)
Creatinine, Ser: 0.92 mg/dL (ref 0.61–1.24)
GFR calc Af Amer: >60 mL/min (ref >60)
GFR calc non Af Amer: >60 mL/min (ref >60)
Glucose, Bld: 125 mg/dL — ABNORMAL HIGH (ref 70–99)
Potassium: 3.9 mmol/L (ref 3.5–5.1)
Sodium: 136 mmol/L (ref 135–145)
Total Bilirubin: 0.3 mg/dL (ref 0.3–1.2)
Total Protein: 6.8 g/dL (ref 6.5–8.1)

## 2019-11-11 MED ORDER — HEPARIN SOD (PORK) LOCK FLUSH 100 UNIT/ML IV SOLN
500.0000 [IU] | Freq: Once | INTRAVENOUS | Status: AC
  Administered 2019-11-11: 500 [IU] via INTRAVENOUS
  Filled 2019-11-11: qty 5

## 2019-11-11 MED ORDER — SODIUM CHLORIDE 0.9% FLUSH
10.0000 mL | Freq: Once | INTRAVENOUS | Status: AC
  Administered 2019-11-11: 10 mL via INTRAVENOUS
  Filled 2019-11-11: qty 10

## 2019-11-11 MED ORDER — SODIUM CHLORIDE 0.9 % IV SOLN
Freq: Once | INTRAVENOUS | Status: AC
  Filled 2019-11-11: qty 250

## 2019-11-11 NOTE — Progress Notes (Signed)
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 For his prostate cancer he has received IM RT in the past. He has been seeing Dr. Erlene Quan in the past for his recurrent UTIs as well as hematuria. CT scan in July 2018 showed bladder wall thickening with perivascular edema and inflammation in the right ureter greater than the left. Findings were thought to be due to pyelonephritis at that time. He underwent cystoscopy on 12/14/2017 which showed abnormal looking prostate with necrotic material lining the entire surface area. Diffuse copious debris within the bladder appearing to be erythematous without discrete bladder tumor but visualization was poor. He was then admitted to the hospital on 12/26/2017 with symptoms of UTI and sepsis as well as new moderate bilateral hydronephrosis.  CT abdomen and pelvis with contrast on 12/20/2017 again showed market bile bladder wall thickening with perivesicular edema. Within the lumen of the bladder there is a 93.9 cm soft tissue attenuating filling defect. This is indeterminate and could represent an area of blood clot versus urothelial lesion.  He underwent repeat cystoscopy with bilateral pyelogram and ureteral stent placement as well as TURBT and TURP. He was found to have a massive tumor involving the majority of the bladder with grossly necrotic and calcified material. There appeared to be multifocal disease with large burden of the left  anterior bladder wall extending posteriorly as well as adjacent to the bladder neck and beyond the right hemitrigone. Very little normal recognizable bladder mucosa remaining.   Biopsy from TURBT and TURP showed: High-grade urothelial carcinoma with extensive necrosisinvolving both the bladder and the prostate.CT chest did not reveal any evidence of metastatic disease. He was also not found to have any regional adenopathy on CT abdomen  Dr. Erlene Quan performed interval TURBT on 01/15/18 and was able to debulk tumor as much as possible to reduce tumor burden  Patient received 5 cycles ofcarboplatin/ gemcitabine 2 weeks on and 1 week off ending 04/26/18.Patient did receive radiation for 10 days during cycle 2 of treatment. Given patients age, co-morbidities and frailty- he was not a cisplatin candidate.6th cycle not given due to fall and hip fracture  Patient had a repeat cystoscopy in September 2019 which showed no obvious tumor bladder but it did have a shaggy necrotic appearance at the bladder neck. Prostatic fossa was grossly abnormal necrotic and irregular without papillary change. Bladder neck was biopsied and showed residual high-grade urothelial carcinoma. Muscularis propria was not seen in that specimen.Due to evidence of recurrent/residual disease patient was started on Tecentriq on 07/26/2018  Patient noted to have local progression of his bladder cancer causing bowel obstruction in July 2020. He is status post diverting colostomy. He also has bilateral nephrostomies in place.FGFR mutation testing was negative. Third line padcevstarted after performance status showed some improvement  Interval history- he is at home post discharge but has not received home health. He lives with his wife at his granddaughters house. He is still quite fatigued and is mostly in bed. Needs assistance to move into a wheelchair  ECOG PS-  3 Pain scale- 4 Opioid associated constipation-  no  Review of systems- Review of Systems  Constitutional: Positive for malaise/fatigue. Negative for chills, fever and weight loss.  HENT: Negative for congestion, ear discharge and nosebleeds.   Eyes: Negative for blurred vision.  Respiratory: Negative for cough, hemoptysis, sputum production, shortness of breath and wheezing.   Cardiovascular: Negative for chest pain, palpitations, orthopnea and claudication.  Gastrointestinal: Positive for abdominal pain. Negative for blood in stool, constipation, diarrhea, heartburn, melena, nausea and vomiting.  Genitourinary: Negative for dysuria, flank pain, frequency, hematuria and urgency.  Musculoskeletal: Negative for back pain, joint pain and myalgias.  Skin: Negative for rash.  Neurological: Negative for dizziness, tingling, focal weakness, seizures, weakness and headaches.  Endo/Heme/Allergies: Does not bruise/bleed easily.  Psychiatric/Behavioral: Negative for depression and suicidal ideas. The patient does not have insomnia.       No Known Allergies   Past Medical History:  Diagnosis Date  . Anxiety   . Arthritis   . Bladder cancer ([place])   . Dysrhythmia 07/2018   history of atrial flutter that worsens with anxiety  . Femur fracture, right ([place]) 05/01/2018  . GERD (gastroesophageal reflux disease)   . History of recent blood transfusion 05/2018  . Hypertension   . Iron deficiency anemia 09/14/2018  . Prostate cancer ([place]) 07/2018   cancer growing in prostate but not prostate cancer, it is from the bladder  . Umbilical hernia 79/0240  . Urinary retention 2019   foley catheter place 11/2017  . UTI (urinary tract infection) 2019   frequent UTI's over last year  . Wound eschar of foot 07/2018   left heal getting wrapped and requiring antibiotic cream. cracks open with weight bearing.     Past Surgical History:  Procedure Laterality Date  . CARPAL TUNNEL RELEASE Right   . CHOLECYSTECTOMY  2004  . COLOSTOMY N/A 06/08/2019    Procedure: COLOSTOMY;  Surgeon: Herbert Pun, MD;  Location: [facility] OR[facility];  Service: General;  Laterality: N/A;  . CYSTOGRAM  07/18/2018   Procedure: CYSTOGRAM;  Surgeon: [name], MD;  Location: [facility];  Service: Urology;;  . Consuela Mimes W/ RETROGRADES Bilateral 07/18/2018   Procedure: CYSTOSCOPY WITH RETROGRADE PYELOGRAM;  Surgeon: [name], MD;  Location: [facility];  Service: Urology;  Laterality: Bilateral;  . CYSTOSCOPY W/ URETERAL STENT PLACEMENT Bilateral 12/27/2017   Procedure: CYSTOSCOPY WITH RETROGRADE PYELOGRAM/URETERAL STENT PLACEMENT;  Surgeon: [name], MD;  Location: [facility];  Service: Urology;  Laterality: Bilateral;  . CYSTOSCOPY W/ URETERAL STENT PLACEMENT Bilateral 07/18/2018   Procedure: CYSTOSCOPY WITH STENT REPLACEMENT (exchange);  Surgeon: [name], MD;  Location: [facility];  Service: Urology;  Laterality: Bilateral;  . CYSTOSCOPY WITH STENT PLACEMENT Bilateral 11/12/2018   Procedure: [place] WITH STENT Exchange;  Surgeon: [name], MD;  Location: [facility];  Service: Urology;  Laterality: Bilateral;  . INTRAMEDULLARY (IM) NAIL INTERTROCHANTERIC Right 05/02/2018   Procedure: INTRAMEDULLARY (IM) NAIL INTERTROCHANTRIC;  Surgeon: [name], MD;  Location: [facility];  Service: Orthopedics;  Laterality: Right;  . IR NEPHROSTOMY EXCHANGE LEFT  03/25/2019  . IR NEPHROSTOMY EXCHANGE LEFT  04/19/2019  . IR NEPHROSTOMY EXCHANGE LEFT  05/31/2019  . IR NEPHROSTOMY EXCHANGE LEFT  07/19/2019  . IR NEPHROSTOMY EXCHANGE LEFT  09/13/2019  . IR NEPHROSTOMY EXCHANGE RIGHT  03/25/2019  . IR NEPHROSTOMY EXCHANGE RIGHT  04/19/2019  . IR NEPHROSTOMY EXCHANGE RIGHT  05/31/2019  . IR NEPHROSTOMY EXCHANGE RIGHT  07/19/2019  . IR NEPHROSTOMY EXCHANGE RIGHT  09/13/2019  .  IR NEPHROSTOMY PLACEMENT LEFT  02/23/2019  . IR NEPHROSTOMY PLACEMENT RIGHT  02/23/2019  . LAPAROTOMY N/A 06/08/2019   Procedure: EXPLORATORY LAPAROTOMY;  Surgeon: Herbert Pun, MD;  Location:  [facility];  Service: General;  Laterality: N/A;  . LEG TENDON SURGERY Right 1958  . PORTA CATH INSERTION N/A 01/22/2018   Procedure: PORTA CATH INSERTION;  Surgeon: [name], MD;  Location: [place] [facility];  Service: Cardiovascular;  Laterality: N/A;  . TRANSURETHRAL RESECTION OF BLADDER TUMOR N/A 12/27/2017   Procedure: TRANSURETHRAL RESECTION OF BLADDER TUMOR (TURBT);  Surgeon: [name], MD;  Location: [facility];  Service: Urology;  Laterality: N/A;  . TRANSURETHRAL RESECTION OF BLADDER TUMOR N/A 01/15/2018   Procedure: TRANSURETHRAL RESECTION OF BLADDER TUMOR (TURBT);  Surgeon: [name], MD;  Location: [facility];  Service: Urology;  Laterality: N/A;  Need 2 hrs for this case please  . TRANSURETHRAL RESECTION OF BLADDER TUMOR N/A 07/18/2018   Procedure: TRANSURETHRAL RESECTION OF BLADDER TUMOR (TURBT);  Surgeon: [name], MD;  Location: [facility];  Service: Urology;  Laterality: N/A;    Social History   Socioeconomic History  . Marital status: Married    Spouse name: ruth  . Number of children: Not on file  . Years of education: Not on file  . Highest education level: Not on file  Occupational History  . Occupation: retired    Comment: Therapist, [employer]  Tobacco Use  . Smoking status: Never Smoker  . Smokeless tobacco: Never Used  Substance and Sexual Activity  . Alcohol use: No    Alcohol/week: 0.0 standard drinks  . Drug use: No  . Sexual activity: Not Currently  Other Topics Concern  . Not on file  Social History Narrative  . Not on file   Social Determinants of Health   Financial Resource Strain: Unknown  . Difficulty of Paying Living Expenses: Patient refused  Food Insecurity: Unknown  . Worried About Charity fundraiser in the Last Year: Patient refused  . Ran Out of Food in the Last Year: Patient refused  Transportation Needs: Unknown  . Lack of Transportation (Medical): Patient refused  . Lack of Transportation (Non-Medical): Patient refused    Physical Activity: Unknown  . Days of Exercise per Week: Patient refused  . Minutes of Exercise per Session: Patient refused  Stress: Unknown  . Feeling of Stress : Patient refused  Social Connections: Unknown  . Frequency of Communication with Friends and Family: Patient refused  . Frequency of Social Gatherings with Friends and Family: Patient refused  . Attends Religious Services: Patient refused  . Active Member of Clubs or Organizations: Patient refused  . Attends Archivist Meetings: Patient refused  . Marital Status: Patient refused  Intimate Partner Violence: Unknown  . Fear of Current or Ex-Partner: Patient refused  . Emotionally Abused: Patient refused  . Physically Abused: Patient refused  . Sexually Abused: Patient refused    Family History  Problem Relation Age of Onset  . Cancer Mother   . Chronic Renal Failure Mother   . Heart disease Father      Current Outpatient Medications:  .  acetaminophen (TYLENOL) 500 MG tablet, Take 1,000 mg by mouth every 6 (six) hours as needed for moderate pain or headache. , Disp: , Rfl:  .  cetirizine (ZYRTEC) 10 MG tablet, Take 10 mg by mouth daily as needed for allergies. , Disp: , Rfl:  .  Cranberry-Cholecalciferol (SUPER CRANBERRY/VITAMIN D3) 4200-500 MG-UNIT CAPS, Take 1 capsule  by mouth 2 (two) times daily., Disp: , Rfl:  .  dexamethasone (DECADRON) 4 MG tablet, Take 2 tablets (8 mg total) by mouth daily. Start the day after chemotherapy for 2 days., Disp: 30 tablet, Rfl: 1 .  feeding supplement, ENSURE ENLIVE, (ENSURE ENLIVE) LIQD, Take 237 mLs by mouth 2 (two) times daily between meals., Disp: 237 mL, Rfl: 12 .  ferrous sulfate 325 (65 FE) MG tablet, Take 1 tablet (325 mg total) by mouth 2 (two) times daily with a meal., Disp: , Rfl: 3 .  Glycerin-Hypromellose-PEG 400 (DRY EYE RELIEF DROPS OP), Apply 1 drop to eye daily as needed. , Disp: , Rfl:  .  lidocaine-prilocaine (EMLA) cream, Apply 1 application topically as  needed., Disp: 30 g, Rfl: 1 .  Multiple Vitamin (MULTIVITAMIN WITH MINERALS) TABS tablet, Take 1 tablet by mouth daily., Disp: , Rfl:  .  MYRBETRIQ 50 MG TB24 tablet, TAKE 1 TABLET BY MOUTH DAILY (Patient taking differently: Take 50 mg by mouth daily. ), Disp: 30 tablet, Rfl: 3 .  nystatin (NYAMYC) powder, Apply topically 2 (two) times daily. (Patient taking differently: Apply 1 g topically 2 (two) times daily as needed. ), Disp: 60 g, Rfl: 3 .  oxyCODONE (OXY IR/ROXICODONE) 5 MG immediate release tablet, TAKE 1 TABLET BY MOUTH EVERY 4 HOURS AS NEEDED FOR SEVERE PAIN, Disp: 60 tablet, Rfl: 0 .  polyethylene glycol powder (MIRALAX) powder, Take 17 g by mouth daily as needed. Can increase to 3 times a day as needed for constipation but hold medication if has diarrhea, Disp: 255 g, Rfl: 0 .  vitamin B-12 (CYANOCOBALAMIN) 500 MCG tablet, Take 500 mcg by mouth daily., Disp: , Rfl:  .  zinc oxide 20 % ointment, Apply 1 application topically as needed for irritation., Disp: , Rfl:  .  ondansetron (ZOFRAN) 8 MG tablet, Take 1 tablet (8 mg total) by mouth 2 (two) times daily as needed for refractory nausea / vomiting. Start on day 3 after chemo. (Patient not taking: Reported on 11/11/2019), Disp: 30 tablet, Rfl: 1 .  prochlorperazine (COMPAZINE) 10 MG tablet, Take 1 tablet (10 mg total) by mouth every 6 (six) hours as needed (Nausea or vomiting). (Patient not taking: Reported on 11/11/2019), Disp: 30 tablet, Rfl: 1  Physical exam:  Vitals:   11/11/19 0859  BP: 96/75  Pulse: (!) 117  Temp: (!) 97.2 F (36.2 C)  TempSrc: Tympanic  SpO2: 99%  Weight: 143 lb (64.9 kg)   Physical Exam Constitutional:      Comments: Elderly tremulous frail gentleman sitting in a wheelchair. He is hard of hearing  HENT:     Head: Normocephalic and atraumatic.  Eyes:     Pupils: Pupils are equal, round, and reactive to light.  Cardiovascular:     Rate and Rhythm: Regular rhythm. Tachycardia present.     Heart sounds:  Normal heart sounds.  Pulmonary:     Effort: Pulmonary effort is normal.     Breath sounds: Normal breath sounds.  Abdominal:     General: Bowel sounds are normal.     Palpations: Abdomen is soft.     Comments: Bilateral nephrostomy tubes in place.  Diverting colostomy in place.  Ostomy bag over open wound in the lower anterior abdominal wall which has sero-sanguinous discharge  Musculoskeletal:     Cervical back: Normal range of motion.  Skin:    General: Skin is warm and dry.  Neurological:     Mental Status: He is alert and  oriented to person, place, and time.      CMP Latest Ref Rng & Units 11/11/2019  Glucose 70 - 99 mg/dL 125(H)  BUN 8 - 23 mg/dL 24(H)  Creatinine 0.61 - 1.24 mg/dL 0.92  Sodium 135 - 145 mmol/L 136  Potassium 3.5 - 5.1 mmol/L 3.9  Chloride 98 - 111 mmol/L 101  CO2 22 - 32 mmol/L 24  Calcium 8.9 - 10.3 mg/dL 9.0  Total Protein 6.5 - 8.1 g/dL 6.8  Total Bilirubin 0.3 - 1.2 mg/dL 0.3  Alkaline Phos 38 - 126 U/L 92  AST 15 - 41 U/L 18  ALT 0 - 44 U/L 14   CBC Latest Ref Rng & Units 11/11/2019  WBC 4.0 - 10.5 K/uL 34.6(H)  Hemoglobin 13.0 - 17.0 g/dL 11.5(L)  Hematocrit 39.0 - 52.0 % 36.9(L)  Platelets 150 - 400 K/uL 378    No images are attached to the encounter.  CT ABDOMEN PELVIS W CONTRAST  Result Date: 10/13/2019 CLINICAL DATA:  Abdominal pain, persistent leukocytosis, suspected abscess, re-evaluate anterior abdominal abscess; history of prostate cancer, locally invasive bladder cancer, hypertension, GERD EXAM: CT ABDOMEN AND PELVIS WITH CONTRAST TECHNIQUE: Multidetector CT imaging of the abdomen and pelvis was performed using the standard protocol following bolus administration of intravenous contrast. Sagittal and coronal MPR images reconstructed from axial data set. CONTRAST:  11mL OMNIPAQUE IOHEXOL 300 MG/ML SOLN IV. No oral contrast. COMPARISON:  10/05/2019 FINDINGS: Lower chest: Bibasilar small pleural effusions and compressive atelectasis of the  lower lobes. Hepatobiliary: Gallbladder surgically absent.  Liver unremarkable. Pancreas: Atrophic without mass Spleen: Normal appearance Adrenals/Urinary Tract: Adrenal glands normal appearance. BILATERAL nephrostomy tubes. Mild BILATERAL ureteral dilatation greater on LEFT. Small BILATERAL renal cysts. Large enhancing mass arising from the bladder consistent with bladder neoplasm with invasion of the pre vesicle space and anterior abdominal wall, mass overall measuring 14.3 x 8.2 x 10.0 cm. Decreased air within the subcutaneous loculation, with this collection now clearly appearing to communicate with the bladder lumen rather than a separate loculated collection. Stomach/Bowel: Loop colostomy mid abdomen. Slightly increased stool in RIGHT colon and within rectum. Small bowel loops unremarkable. Stomach normal appearance. Normal appendix. Vascular/Lymphatic: Atherosclerotic calcifications aorta, iliac arteries, femoral arteries. Aorta normal caliber. No adenopathy. Reproductive: Minimal prostatic enlargement. Several clips within the prostate gland with suspect prior TURP. Other: No free air or free fluid.  No hernia. Musculoskeletal: Bones demineralized. Advanced degenerative changes of both hips greater on RIGHT with prior proximal RIGHT femoral ORIF. IMPRESSION: Slight increase in size of bladder neoplasm since previous exam with evidence of invasion of the anterior abdominal wall. The previously identified abscess collection anterior to the bladder appears to communicate with the bladder lumen and demonstrates less air on the current study, otherwise unchanged. BILATERAL distal ureteral obstruction with BILATERAL nephrostomy tubes. Small BILATERAL pleural effusions and bibasilar atelectasis. Electronically Signed   By: Lavonia Dana M.D.   On: 10/13/2019 15:47     Assessment and plan- Patient is a 85 y.o. male  with locally progressive metastatic bladder cancer with colon involvement s/p diverting colostomy  and bilateral nephrostomy tubes in place.  This is a post hospital discharge visit  Patient currently has bilateral nephrostomy tubes along with a diverting colostomy as well as an ostomy bag over the open wound in the lower anterior abdominal wall due to local tumor extension through the abdominal wall.  He has already received palliative radiation to this area.  Diverting colostomy was performed due to malignant bowel obstruction  from the tumor.  Patient continues to feel poorly overall and is quite fatigued and spends most of his time in bed.  He does require assistance from his family members for his ADLs.  Patient's wife has been his main caregiver and she is not presently interested in enrolling for hospice services.  He has not had any home health coming to his house after hospital discharge.  We will arrange for home palliative care and see if home health can be arranged through them.  Patient is not presently a candidate for any systemic treatment I will continue supportive care at this time.  He is somewhat hypotensive and is also tachycardic.  I will give him 1 L of IV fluids today with 10 mg of IV Decadron.  I will see him back in 3 weeks with labs for possible fluids.  He also sees Dr. Donella Stade on that day  Leukocytosis: Likely leukemoid reaction secondary to malignancy.  Continue to monitor   Visit Diagnosis 1. Urothelial cancer ([place])   2. Goals of care, counseling/discussion   3. Leukemoid reaction      Dr. Randa Evens, MD, MPH [facility] [phone] 11/11/2019 1:19 PM

## 2019-11-11 NOTE — Addendum Note (Signed)
Addended by: Randa Evens C on: 11/11/2019 01:33 PM   Modules accepted: Orders

## 2019-11-11 NOTE — Progress Notes (Signed)
Patient stated that he stays with pain on his pelvic area.

## 2019-11-12 ENCOUNTER — Other Ambulatory Visit: Payer: Self-pay | Admitting: Radiology

## 2019-11-12 DIAGNOSIS — N390 Urinary tract infection, site not specified: Secondary | ICD-10-CM

## 2019-11-12 MED ORDER — CIPROFLOXACIN HCL 500 MG PO TABS: 500.0000 mg | ORAL_TABLET | Freq: Two times a day (BID) | ORAL | 0 refills | Status: DC

## 2019-11-15 ENCOUNTER — Ambulatory Visit: Admission: RE | Admit: 2019-11-15 | Payer: Medicare Other | Source: Ambulatory Visit

## 2019-11-19 ENCOUNTER — Telehealth: Payer: Self-pay | Admitting: Primary Care

## 2019-11-19 NOTE — Telephone Encounter (Signed)
Spoke with patient's wife [name] regarding Palliative services and she was in agreement with this.  I have scheduled a Telephone Consult for 11/21/19 @ 3 PM

## 2019-11-21 ENCOUNTER — Other Ambulatory Visit: Payer: Medicare Other | Admitting: Primary Care

## 2019-11-21 ENCOUNTER — Other Ambulatory Visit: Payer: Self-pay

## 2019-11-21 DIAGNOSIS — Z515 Encounter for palliative care: Secondary | ICD-10-CM

## 2019-11-21 NOTE — Progress Notes (Signed)
[facility] Palliative Care Consult Note Telephone: [phone]  Fax: [phone]  TELEHEALTH VISIT STATEMENT Due to the COVID-19 crisis, this visit was done via telemedicine from my office. It was initiated and consented to by this patient and/or family.  PATIENT NAME: Vincent Poole [address] [place] [address] [phone] (home)  DOB: Jun 02, 1935 MRN: [id number]  PRIMARY CARE PROVIDER:   [name], MD, [place] [address] [phone]  REFERRING PROVIDER:  [name], [place] [facility] [place],  [place] [address] [phone]  RESPONSIBLE PARTY:   Extended Emergency Contact Information Primary Emergency Contact: Minchey,[name] Address: [place], [place] [address] [address]net[address] Litter of [address] Phone: [phone] Mobile Phone: [phone] Relation: Spouse Secondary Emergency Contact: [employer] Address: [address]          [place], [place] [address] Johnnette Litter of [place] Phone: [phone] Mobile Phone: [phone] Relation: Daughter   ASSESSMENT AND RECOMMENDATIONS:   1. Advance Care Planning/Goals of Care: Goals include to maximize quality of life and symptom management. I met with PCG today by phone due to no video technology. We discussed patient's course of illness, and current plan of treatment. She states he will likely not be a candidate for further chemo or radiation treatments. We discussed advance care planning and hospice when the time care. She states she was opposed to hospice due to a bad experience with her own father. She also recounted recent discharge from hospital and states she wants to have her husband home for his care as much as possible.  2. Symptom Management:   Nephrostomy tubes: Will be replaced tomorrow at outpatient procedure. He currently has these for urine  evacuation.  Nutrition: States he is eating well, but recent albumin level was 2.9. He may benefit from nutritional supplements. He is also taking iron and having thicker stools. I suggested titrating miralax to optimal consistency.  Disease process: Discussed course of illness and current status with apparent fungating tumor by her description, and another possibly about to fungate. She states a draining tumor into an ostomy bag.I will assess on my home visit in 10 days. He appears to be at stage for supportive care.   Caregiving: Currently at their grand daughter's home for care. Wife stays at day time and granddaughter gives care at night. Wife states they are managing with family helping at this time. She was quite opposed to hospice services because of poor care of her father. I asked about home health but she was again opposed and I doubt he would qualify for rehab services given his poor PPS.  I will continue to provide education and clarification about hospice services or other community resources.  Pain;:Wife reports patient has pain but does not want to overuse. He appears to have a lot of pain with movement. Appears not open to proactive pain management. I will continue to provide education RE managing chronic pain on a scheduled basis as I believe he would have a better quality of life.  3. Family /Caregiver/Community Supports:  Lives with granddaughter for pm care and wife stays during day. No home health has begun and they didn't want to go to rehab. Family is doing care.   4. Cognitive / Functional decline: Alert but fatigued. dependant in many adls.  5. Follow up Palliative Care Visit: Palliative care will continue  to follow for goals of care clarification and symptom management. Return 1-2 weeks or prn.  I spent 60 minutes providing this consultation,  from 1515 to 1615. More than 50% of the time in this consultation was spent coordinating communication.   HISTORY OF PRESENT  ILLNESS:  Vincent Poole is a 85 y.o. year old male with multiple medical problems including locally advanced urothelial cancer, nephrostomies, malignant bowel obstruction and diverting colostomy, h/o falls and fx. Palliative Care was asked to follow this patient by consultation request of Sindy Guadeloupe, MD [place],  [place] [phone] To help address advance care planning and goals of care. This is the initial visit.  CODE STATUS: TBD  PPS: 30% HOSPICE ELIGIBILITY/DIAGNOSIS: yes/urothelial cancer  PAST MEDICAL HISTORY:  Past Medical History:  Diagnosis Date  . Anxiety   . Arthritis   . Bladder cancer ([facility])   . Dysrhythmia 07/2018   history of atrial flutter that worsens with anxiety  . Femur fracture, right ([facility]) 05/01/2018  . GERD (gastroesophageal reflux disease)   . History of recent blood transfusion 05/2018  . Hypertension   . Iron deficiency anemia 09/14/2018  . Prostate cancer ([facility]) 07/2018   cancer growing in prostate but not prostate cancer, it is from the bladder  . Umbilical hernia 60/4540  . Urinary retention 2019   foley catheter place 11/2017  . UTI (urinary tract infection) 2019   frequent UTI's over last year  . Wound eschar of foot 07/2018   left heal getting wrapped and requiring antibiotic cream. cracks open with weight bearing.    SOCIAL HX:  Social History   Tobacco Use  . Smoking status: Never Smoker  . Smokeless tobacco: Never Used  Substance Use Topics  . Alcohol use: No    Alcohol/week: 0.0 standard drinks    ALLERGIES: No Known Allergies   PERTINENT MEDICATIONS:  Outpatient Encounter Medications as of 11/21/2019  Medication Sig  . acetaminophen (TYLENOL) 500 MG tablet Take 1,000 mg by mouth every 6 (six) hours as needed for moderate pain or headache.   . cetirizine (ZYRTEC) 10 MG tablet Take 10 mg by mouth daily as needed for allergies.   . ciprofloxacin (CIPRO) 500 MG tablet Take 1 tablet (500 mg total) by mouth 2 (two) times  daily for 3 days. Begin medication on 11/21/2019  . Cranberry-Cholecalciferol (SUPER CRANBERRY/VITAMIN D3) 4200-500 MG-UNIT CAPS Take 1 capsule by mouth 2 (two) times daily.  Marland Kitchen dexamethasone (DECADRON) 4 MG tablet Take 2 tablets (8 mg total) by mouth daily. Start the day after chemotherapy for 2 days.  . feeding supplement, ENSURE ENLIVE, (ENSURE ENLIVE) LIQD Take 237 mLs by mouth 2 (two) times daily between meals.  . ferrous sulfate 325 (65 FE) MG tablet Take 1 tablet (325 mg total) by mouth 2 (two) times daily with a meal.  . Glycerin-Hypromellose-PEG 400 (DRY EYE RELIEF DROPS OP) Apply 1 drop to eye daily as needed.   . lidocaine-prilocaine (EMLA) cream Apply 1 application topically as needed.  . Multiple Vitamin (MULTIVITAMIN WITH MINERALS) TABS tablet Take 1 tablet by mouth daily.  Marland Kitchen MYRBETRIQ 50 MG TB24 tablet TAKE 1 TABLET BY MOUTH DAILY (Patient taking differently: Take 50 mg by mouth daily. )  . nystatin [facility]) powder Apply topically 2 (two) times daily. (Patient taking differently: Apply 1 g topically 2 (two) times daily as needed. )  . ondansetron (ZOFRAN) 8 MG tablet Take 1 tablet (8 mg total) by mouth 2 (two) times daily  as needed for refractory nausea / vomiting. Start on day 3 after chemo. (Patient not taking: Reported on 11/11/2019)  . oxyCODONE (OXY IR/ROXICODONE) 5 MG immediate release tablet TAKE 1 TABLET BY MOUTH EVERY 4 HOURS AS NEEDED FOR SEVERE PAIN  . polyethylene glycol powder (MIRALAX) powder Take 17 g by mouth daily as needed. Can increase to 3 times a day as needed for constipation but hold medication if has diarrhea  . prochlorperazine (COMPAZINE) 10 MG tablet Take 1 tablet (10 mg total) by mouth every 6 (six) hours as needed (Nausea or vomiting). (Patient not taking: Reported on 11/11/2019)  . vitamin B-12 (CYANOCOBALAMIN) 500 MCG tablet Take 500 mcg by mouth daily.  Marland Kitchen zinc oxide 20 % ointment Apply 1 application topically as needed for irritation.   No  facility-administered encounter medications on file as of 11/21/2019.    PHYSICAL EXAM / ROS:   Current and past weights: 143 lb in cone chart,  General: NAD, frail, thin Cardiovascular: no chest pain reported, no edema reported Pulmonary: no cough, no increased SOB reported Abdomen: appetite good, endorses constipation via ostomy, incontinent of bowel GU: denies dysuria, nephrostomies draining into bag, incontinent of urine MSK:  no joint deformities, non ambulatory Skin: left abdomen fistula drain, r side knot rising and may open Neurological: Weakness, endorses pain,  fatigue  Jason Coop, NP

## 2019-11-22 ENCOUNTER — Other Ambulatory Visit: Payer: Self-pay

## 2019-11-22 ENCOUNTER — Ambulatory Visit
Admission: RE | Admit: 2019-11-22 | Discharge: 2019-11-22 | Disposition: A | Discharge status: Home / Self Care | Payer: Medicare Other | Source: Ambulatory Visit | Attending: Urology | Admitting: Urology

## 2019-11-22 DIAGNOSIS — Z436 Encounter for attention to other artificial openings of urinary tract: Secondary | ICD-10-CM | POA: Insufficient documentation

## 2019-11-22 DIAGNOSIS — L03311 Cellulitis of abdominal wall: Secondary | ICD-10-CM | POA: Diagnosis not present

## 2019-11-22 DIAGNOSIS — A419 Sepsis, unspecified organism: Secondary | ICD-10-CM | POA: Diagnosis not present

## 2019-11-22 DIAGNOSIS — N1339 Other hydronephrosis: Secondary | ICD-10-CM | POA: Insufficient documentation

## 2019-11-22 HISTORY — PX: IR NEPHROSTOMY EXCHANGE RIGHT: IMG6070

## 2019-11-22 HISTORY — PX: IR NEPHROSTOMY EXCHANGE LEFT: IMG6069

## 2019-11-22 MED ORDER — IODIXANOL 320 MG/ML IV SOLN
50.0000 mL | Freq: Once | INTRAVENOUS | Status: AC | PRN
  Administered 2019-11-22: 10 mL

## 2019-11-22 NOTE — Procedures (Signed)
Pre Procedure Dx: Hydronephrosis °Post Procedure Dx: Same ° °Successful bilateral 12 Fr PCN exchange.   ° °EBL: None °No immediate complications.  ° °Jay Dyon Rotert, MD °Pager #: [phone] ° ° °

## 2019-11-23 ENCOUNTER — Other Ambulatory Visit: Payer: Self-pay

## 2019-11-23 ENCOUNTER — Inpatient Hospital Stay
Admission: EM | Admit: 2019-11-23 | Discharge: 2019-11-26 | DRG: 871 | Disposition: A | Discharge status: Home / Self Care | Payer: Medicare Other | Attending: Internal Medicine | Admitting: Internal Medicine

## 2019-11-23 ENCOUNTER — Emergency Department: Payer: Medicare Other

## 2019-11-23 DIAGNOSIS — Z9221 Personal history of antineoplastic chemotherapy: Secondary | ICD-10-CM

## 2019-11-23 DIAGNOSIS — Z8249 Family history of ischemic heart disease and other diseases of the circulatory system: Secondary | ICD-10-CM

## 2019-11-23 DIAGNOSIS — D65 Disseminated intravascular coagulation [defibrination syndrome]: Secondary | ICD-10-CM | POA: Diagnosis not present

## 2019-11-23 DIAGNOSIS — Z841 Family history of disorders of kidney and ureter: Secondary | ICD-10-CM

## 2019-11-23 DIAGNOSIS — E46 Unspecified protein-calorie malnutrition: Secondary | ICD-10-CM | POA: Diagnosis not present

## 2019-11-23 DIAGNOSIS — Z20822 Contact with and (suspected) exposure to covid-19: Secondary | ICD-10-CM | POA: Diagnosis present

## 2019-11-23 DIAGNOSIS — Z936 Other artificial openings of urinary tract status: Secondary | ICD-10-CM

## 2019-11-23 DIAGNOSIS — E538 Deficiency of other specified B group vitamins: Secondary | ICD-10-CM

## 2019-11-23 DIAGNOSIS — E43 Unspecified severe protein-calorie malnutrition: Secondary | ICD-10-CM | POA: Diagnosis present

## 2019-11-23 DIAGNOSIS — Z87440 Personal history of urinary (tract) infections: Secondary | ICD-10-CM

## 2019-11-23 DIAGNOSIS — I10 Essential (primary) hypertension: Secondary | ICD-10-CM | POA: Diagnosis present

## 2019-11-23 DIAGNOSIS — D649 Anemia, unspecified: Secondary | ICD-10-CM | POA: Diagnosis present

## 2019-11-23 DIAGNOSIS — L0390 Cellulitis, unspecified: Secondary | ICD-10-CM | POA: Diagnosis not present

## 2019-11-23 DIAGNOSIS — Z8781 Personal history of (healed) traumatic fracture: Secondary | ICD-10-CM | POA: Diagnosis not present

## 2019-11-23 DIAGNOSIS — C679 Malignant neoplasm of bladder, unspecified: Secondary | ICD-10-CM | POA: Diagnosis present

## 2019-11-23 DIAGNOSIS — Z66 Do not resuscitate: Secondary | ICD-10-CM | POA: Diagnosis present

## 2019-11-23 DIAGNOSIS — Z960 Presence of urogenital implants: Secondary | ICD-10-CM | POA: Diagnosis not present

## 2019-11-23 DIAGNOSIS — M1990 Unspecified osteoarthritis, unspecified site: Secondary | ICD-10-CM | POA: Diagnosis present

## 2019-11-23 DIAGNOSIS — H9193 Unspecified hearing loss, bilateral: Secondary | ICD-10-CM | POA: Diagnosis present

## 2019-11-23 DIAGNOSIS — Z6820 Body mass index (BMI) 20.0-20.9, adult: Secondary | ICD-10-CM

## 2019-11-23 DIAGNOSIS — K219 Gastro-esophageal reflux disease without esophagitis: Secondary | ICD-10-CM | POA: Diagnosis present

## 2019-11-23 DIAGNOSIS — L03311 Cellulitis of abdominal wall: Secondary | ICD-10-CM | POA: Diagnosis present

## 2019-11-23 DIAGNOSIS — A4101 Sepsis due to Methicillin susceptible Staphylococcus aureus: Secondary | ICD-10-CM | POA: Diagnosis not present

## 2019-11-23 DIAGNOSIS — A419 Sepsis, unspecified organism: Secondary | ICD-10-CM | POA: Diagnosis present

## 2019-11-23 DIAGNOSIS — Z79891 Long term (current) use of opiate analgesic: Secondary | ICD-10-CM | POA: Diagnosis not present

## 2019-11-23 DIAGNOSIS — Z79899 Other long term (current) drug therapy: Secondary | ICD-10-CM | POA: Diagnosis not present

## 2019-11-23 DIAGNOSIS — Z8546 Personal history of malignant neoplasm of prostate: Secondary | ICD-10-CM

## 2019-11-23 DIAGNOSIS — Z933 Colostomy status: Secondary | ICD-10-CM | POA: Diagnosis not present

## 2019-11-23 DIAGNOSIS — Z9049 Acquired absence of other specified parts of digestive tract: Secondary | ICD-10-CM | POA: Diagnosis not present

## 2019-11-23 DIAGNOSIS — Z923 Personal history of irradiation: Secondary | ICD-10-CM

## 2019-11-23 DIAGNOSIS — R6520 Severe sepsis without septic shock: Secondary | ICD-10-CM | POA: Diagnosis not present

## 2019-11-23 DIAGNOSIS — R7881 Bacteremia: Secondary | ICD-10-CM | POA: Diagnosis not present

## 2019-11-23 DIAGNOSIS — N131 Hydronephrosis with ureteral stricture, not elsewhere classified: Secondary | ICD-10-CM | POA: Diagnosis present

## 2019-11-23 DIAGNOSIS — F419 Anxiety disorder, unspecified: Secondary | ICD-10-CM | POA: Diagnosis present

## 2019-11-23 DIAGNOSIS — D72823 Leukemoid reaction: Secondary | ICD-10-CM | POA: Diagnosis present

## 2019-11-23 DIAGNOSIS — L02211 Cutaneous abscess of abdominal wall: Secondary | ICD-10-CM | POA: Diagnosis not present

## 2019-11-23 DIAGNOSIS — C792 Secondary malignant neoplasm of skin: Secondary | ICD-10-CM | POA: Diagnosis not present

## 2019-11-23 DIAGNOSIS — D509 Iron deficiency anemia, unspecified: Secondary | ICD-10-CM | POA: Diagnosis present

## 2019-11-23 DIAGNOSIS — N1330 Unspecified hydronephrosis: Secondary | ICD-10-CM | POA: Diagnosis not present

## 2019-11-23 LAB — CBC WITH DIFFERENTIAL/PLATELET
Abs Immature Granulocytes: 0.16 10*3/uL — ABNORMAL HIGH (ref 0.00–0.07)
Basophils Absolute: 0.1 10*3/uL (ref 0.0–0.1)
Basophils Relative: 1 %
Eosinophils Absolute: 0.3 10*3/uL (ref 0.0–0.5)
Eosinophils Relative: 1 %
HCT: 39 % (ref 39.0–52.0)
Hemoglobin: 12.2 g/dL — ABNORMAL LOW (ref 13.0–17.0)
Immature Granulocytes: 1 %
Lymphocytes Relative: 9 %
Lymphs Abs: 2.4 10*3/uL (ref 0.7–4.0)
MCH: 29.2 pg (ref 26.0–34.0)
MCHC: 31.3 g/dL (ref 30.0–36.0)
MCV: 93.3 fL (ref 80.0–100.0)
Monocytes Absolute: 1.3 10*3/uL — ABNORMAL HIGH (ref 0.1–1.0)
Monocytes Relative: 4 %
Neutro Abs: 24.3 10*3/uL — ABNORMAL HIGH (ref 1.7–7.7)
Neutrophils Relative %: 84 %
Platelets: 469 10*3/uL — ABNORMAL HIGH (ref 150–400)
RBC: 4.18 MIL/uL — ABNORMAL LOW (ref 4.22–5.81)
RDW: 16.8 % — ABNORMAL HIGH (ref 11.5–15.5)
Smear Review: NORMAL
WBC: 28.6 10*3/uL — ABNORMAL HIGH (ref 4.0–10.5)
nRBC: 0 % (ref 0.0–0.2)

## 2019-11-23 LAB — LACTIC ACID, PLASMA
Lactic Acid, Venous: 2.3 mmol/L (ref 0.5–1.9)
Lactic Acid, Venous: 2 mmol/L (ref 0.5–1.9)

## 2019-11-23 LAB — COMPREHENSIVE METABOLIC PANEL
ALT: 12 U/L (ref 0–44)
AST: 15 U/L (ref 15–41)
Albumin: 3.1 g/dL — ABNORMAL LOW (ref 3.5–5.0)
Alkaline Phosphatase: 88 U/L (ref 38–126)
Anion gap: 10 (ref 5–15)
BUN: 22 mg/dL (ref 8–23)
CO2: 28 mmol/L (ref 22–32)
Calcium: 9.4 mg/dL (ref 8.9–10.3)
Chloride: 98 mmol/L (ref 98–111)
Creatinine, Ser: 1.01 mg/dL (ref 0.61–1.24)
GFR calc Af Amer: >60 mL/min (ref >60)
GFR calc non Af Amer: >60 mL/min (ref >60)
Glucose, Bld: 143 mg/dL — ABNORMAL HIGH (ref 70–99)
Potassium: 3.7 mmol/L (ref 3.5–5.1)
Sodium: 136 mmol/L (ref 135–145)
Total Bilirubin: 0.5 mg/dL (ref 0.3–1.2)
Total Protein: 7.3 g/dL (ref 6.5–8.1)

## 2019-11-23 MED ORDER — POLYVINYL ALCOHOL 1.4 % OP SOLN
1.0000 [drp] | Freq: Every day | OPHTHALMIC | Status: DC | PRN
  Filled 2019-11-23: qty 15

## 2019-11-23 MED ORDER — VANCOMYCIN HCL IN DEXTROSE 1-5 GM/200ML-% IV SOLN: 1000.0000 mg | Freq: Once | INTRAVENOUS | Status: DC

## 2019-11-23 MED ORDER — MORPHINE SULFATE (PF) 4 MG/ML IV SOLN
4.0000 mg | Freq: Once | INTRAVENOUS | Status: AC
  Administered 2019-11-23: 18:00:00 4 mg via INTRAVENOUS
  Filled 2019-11-23: qty 1

## 2019-11-23 MED ORDER — PIPERACILLIN-TAZOBACTAM 3.375 G IVPB
3.3750 g | Freq: Three times a day (TID) | INTRAVENOUS | Status: DC
  Administered 2019-11-24 – 2019-11-25 (×5): 3.375 g via INTRAVENOUS
  Filled 2019-11-23 (×6): qty 50

## 2019-11-23 MED ORDER — VANCOMYCIN HCL IN DEXTROSE 1-5 GM/200ML-% IV SOLN
1000.0000 mg | Freq: Once | INTRAVENOUS | Status: AC
  Administered 2019-11-23: 20:00:00 1000 mg via INTRAVENOUS
  Filled 2019-11-23: qty 200

## 2019-11-23 MED ORDER — TRAZODONE HCL 50 MG PO TABS
25.0000 mg | ORAL_TABLET | Freq: Every evening | ORAL | Status: DC | PRN
  Administered 2019-11-23: 22:00:00 25 mg via ORAL
  Filled 2019-11-23: qty 1

## 2019-11-23 MED ORDER — ONDANSETRON HCL 4 MG/2ML IJ SOLN
4.0000 mg | Freq: Four times a day (QID) | INTRAMUSCULAR | Status: DC | PRN
  Administered 2019-11-24: 4 mg via INTRAVENOUS
  Filled 2019-11-23: qty 2

## 2019-11-23 MED ORDER — IOHEXOL 300 MG/ML  SOLN
100.0000 mL | Freq: Once | INTRAMUSCULAR | Status: AC | PRN
  Administered 2019-11-23: 18:00:00 100 mL via INTRAVENOUS

## 2019-11-23 MED ORDER — ENOXAPARIN SODIUM 40 MG/0.4ML ~~LOC~~ SOLN
40.0000 mg | SUBCUTANEOUS | Status: DC
  Administered 2019-11-23 – 2019-11-25 (×3): 40 mg via SUBCUTANEOUS
  Filled 2019-11-23 (×3): qty 0.4

## 2019-11-23 MED ORDER — ONDANSETRON HCL 4 MG PO TABS: 4.0000 mg | ORAL_TABLET | Freq: Four times a day (QID) | ORAL | Status: DC | PRN

## 2019-11-23 MED ORDER — DEXAMETHASONE 4 MG PO TABS: 8.0000 mg | ORAL_TABLET | Freq: Every day | ORAL | Status: DC

## 2019-11-23 MED ORDER — MAGNESIUM HYDROXIDE 400 MG/5ML PO SUSP: 30.0000 mL | Freq: Every day | ORAL | Status: DC | PRN

## 2019-11-23 MED ORDER — SODIUM CHLORIDE 0.9% FLUSH: 3.0000 mL | Freq: Once | INTRAVENOUS | Status: DC

## 2019-11-23 MED ORDER — SODIUM CHLORIDE 0.9 % IV SOLN
2.0000 g | Freq: Once | INTRAVENOUS | Status: AC
  Administered 2019-11-23: 20:00:00 2 g via INTRAVENOUS
  Filled 2019-11-23: qty 20

## 2019-11-23 MED ORDER — CRANBERRY-CHOLECALCIFEROL 4200-500 MG-UNIT PO CAPS: 1.0000 | ORAL_CAPSULE | Freq: Two times a day (BID) | ORAL | Status: DC

## 2019-11-23 MED ORDER — SODIUM CHLORIDE 0.9 % IV SOLN: INTRAVENOUS | Status: DC

## 2019-11-23 MED ORDER — FERROUS SULFATE 325 (65 FE) MG PO TABS
325.0000 mg | ORAL_TABLET | Freq: Two times a day (BID) | ORAL | Status: DC
  Administered 2019-11-24 – 2019-11-26 (×5): 325 mg via ORAL
  Filled 2019-11-23 (×7): qty 1

## 2019-11-23 MED ORDER — ACETAMINOPHEN 325 MG PO TABS: 650.0000 mg | ORAL_TABLET | Freq: Four times a day (QID) | ORAL | Status: DC | PRN

## 2019-11-23 MED ORDER — ENSURE ENLIVE PO LIQD
237.0000 mL | Freq: Two times a day (BID) | ORAL | Status: DC
  Administered 2019-11-24 – 2019-11-26 (×4): 237 mL via ORAL

## 2019-11-23 MED ORDER — ACETAMINOPHEN 650 MG RE SUPP: 650.0000 mg | Freq: Four times a day (QID) | RECTAL | Status: DC | PRN

## 2019-11-23 MED ORDER — SODIUM CHLORIDE 0.9 % IV BOLUS
1000.0000 mL | Freq: Once | INTRAVENOUS | Status: AC
  Administered 2019-11-23: 18:00:00 1000 mL via INTRAVENOUS

## 2019-11-23 MED ORDER — OXYCODONE HCL 5 MG PO TABS
5.0000 mg | ORAL_TABLET | ORAL | Status: DC | PRN
  Administered 2019-11-23 – 2019-11-24 (×2): 5 mg via ORAL
  Filled 2019-11-23 (×2): qty 1

## 2019-11-23 MED ORDER — PIPERACILLIN-TAZOBACTAM 3.375 G IVPB 30 MIN: 3.3750 g | Freq: Four times a day (QID) | INTRAVENOUS | Status: DC

## 2019-11-23 MED ORDER — MIRABEGRON ER 50 MG PO TB24
50.0000 mg | ORAL_TABLET | Freq: Every day | ORAL | Status: DC
  Administered 2019-11-24 – 2019-11-26 (×4): 50 mg via ORAL
  Filled 2019-11-23 (×5): qty 1

## 2019-11-23 MED ORDER — POLYETHYLENE GLYCOL 3350 17 G PO PACK: 17.0000 g | PACK | Freq: Every day | ORAL | Status: DC | PRN

## 2019-11-23 MED ORDER — PROCHLORPERAZINE MALEATE 10 MG PO TABS
10.0000 mg | ORAL_TABLET | Freq: Four times a day (QID) | ORAL | Status: DC | PRN
  Filled 2019-11-23: qty 1

## 2019-11-23 MED ORDER — LORATADINE 10 MG PO TABS
10.0000 mg | ORAL_TABLET | Freq: Every day | ORAL | Status: DC
  Administered 2019-11-23 – 2019-11-26 (×4): 10 mg via ORAL
  Filled 2019-11-23 (×4): qty 1

## 2019-11-23 MED ORDER — VANCOMYCIN HCL 750 MG/150ML IV SOLN
750.0000 mg | Freq: Once | INTRAVENOUS | Status: AC
  Administered 2019-11-23: 22:00:00 750 mg via INTRAVENOUS
  Filled 2019-11-23: qty 150

## 2019-11-23 MED ORDER — VITAMIN B-12 100 MCG PO TABS
500.0000 ug | ORAL_TABLET | Freq: Every day | ORAL | Status: DC
  Administered 2019-11-24 – 2019-11-26 (×4): 500 ug via ORAL
  Filled 2019-11-23 (×4): qty 5

## 2019-11-23 MED ORDER — ADULT MULTIVITAMIN W/MINERALS CH
1.0000 | ORAL_TABLET | Freq: Every day | ORAL | Status: DC
  Administered 2019-11-23 – 2019-11-26 (×4): 1 via ORAL
  Filled 2019-11-23 (×4): qty 1

## 2019-11-23 MED ORDER — DEXAMETHASONE SODIUM PHOSPHATE 10 MG/ML IJ SOLN
INTRAMUSCULAR | Status: AC
  Filled 2019-11-23: qty 1

## 2019-11-23 NOTE — Progress Notes (Signed)
PHARMACIST - PHYSICIAN ORDER COMMUNICATION  CONCERNING: P&T Medication Policy on Herbal Medications  DESCRIPTION:  This patient's order for:  Cranberry/cholecalciferol  has been noted.  This product(s) is classified as an "herbal" or natural product. Due to a lack of definitive safety studies or FDA approval, nonstandard manufacturing practices, plus the potential risk of unknown drug-drug interactions while on inpatient medications, the Pharmacy and Therapeutics Committee does not permit the use of "herbal" or natural products of this type within [facility].   ACTION TAKEN: The pharmacy department is unable to verify this order at this time and your patient has been informed of this safety policy. Please reevaluate patient's clinical condition at discharge and address if the herbal or natural product(s) should be resumed at that time.  Dorena Bodo, PharmD

## 2019-11-23 NOTE — ED Triage Notes (Addendum)
Pt has 2 ostomy bags and Bl nephrectomy bags, pt has noted skin irritation around to the lower stoma and the bag is leaking, states he had drainage from the area around the stoma.

## 2019-11-23 NOTE — H&P (Signed)
[facility] NAME: Vincent Poole    MR#:  [id number]  DATE OF BIRTH:  September 01, 1935  DATE OF ADMISSION:  11/23/2019  PRIMARY CARE PHYSICIAN: [name], MD   REQUESTING/REFERRING PHYSICIAN: Arta Silence, MD CHIEF COMPLAINT:   Chief Complaint  Patient presents with  . Abscess    HISTORY OF PRESENT ILLNESS:  Vincent Poole  is a 85 y.o. male with a known history of multiple medical problems that are mentioned below including bladder cancer that is locally invasive, who presented to the emergency room with acute onset of lower abdominal pain swelling was erythema and tenderness surrounding his urostomy with leaking and drainage.  He denies any fever or chills.  No nausea or vomiting or diarrhea.  No bleeding diathesis.  No chest pain or dyspnea or cough.  No recent exposure to COVID-19.  He was recently started on Cipro empirically for suspected cellulitis.  He has not noticed significant improvement on it.  Upon presentation to the emergency room, heart rate was 118 and vital signs otherwise were within normal.  Labs revealed leukocytosis 28.6 with neutrophilia and mild anemia.  Lactic acid was 2.3 and later 2.  CMP was unremarkable.  Blood cultures were drawn.  Abdominal pelvic CT scan revealed: 1. Redemonstration of the large complex centrally necrotic, enhancing mass arising from the region of the bladder and extending directly through the anterior abdominal wall. There is diminished intraluminal gas but with increasing cystic components of this lesion which measures approximately 12.2 x 10.4 x 13 cm in size. Visible connection with the urinary bladder is noted posteriorly with extension to the anterior midline urostomy. Surrounding phlegmonous changes noted with adjacent reactive free fluid as well. 2. Bilateral percutaneous nephrostomy tubes are in place with mild urothelial thickening in both renal pelves, nonspecific and  possibly reactive due to the presence of the stents versus secondary to urinary tract infection. 3. Bilateral pleural effusions, small on the right and trace on the left, similar to slightly decreased in size from comparison study. 4. Distal colonic thickening within the sigmoid in close proximity to the bladder mass, as detailed above. Significance of which is indeterminate. Diverting loop colostomy in place. 5. Inspissated stool ball within the rectum may represent fecal impaction.  The patient was given 1 L bolus of IV normal saline 4 mg of IV morphine sulfate, 2 g of IV Rocephin and 1 g of IV vancomycin.  He will be admitted to a medical monitored bed for further evaluation and management.   PAST MEDICAL HISTORY:   Past Medical History:  Diagnosis Date  . Anxiety   . Arthritis   . Bladder cancer ([place])   . Dysrhythmia 07/2018   history of atrial flutter that worsens with anxiety  . Femur fracture, right ([place]) 05/01/2018  . GERD (gastroesophageal reflux disease)   . History of recent blood transfusion 05/2018  . Hypertension   . Iron deficiency anemia 09/14/2018  . Prostate cancer ([place]) 07/2018   cancer growing in prostate but not prostate cancer, it is from the bladder  . Umbilical hernia 11/7508  . Urinary retention 2019   foley catheter place 11/2017  . UTI (urinary tract infection) 2019   frequent UTI's over last year  . Wound eschar of foot 07/2018   left heal getting wrapped and requiring antibiotic cream. cracks open with weight bearing.    PAST SURGICAL HISTORY:   Past Surgical History:  Procedure Laterality Date  . CARPAL TUNNEL  RELEASE Right   . CHOLECYSTECTOMY  2004  . COLOSTOMY N/A 06/08/2019   Procedure: COLOSTOMY;  Surgeon: Herbert Pun, MD;  Location: [facility];  Service: General;  Laterality: N/A;  . CYSTOGRAM  07/18/2018   Procedure: CYSTOGRAM;  Surgeon: [name], MD;  Location: [facility];  Service: Urology;;  . Consuela Mimes W/ RETROGRADES  Bilateral 07/18/2018   Procedure: CYSTOSCOPY WITH RETROGRADE PYELOGRAM;  Surgeon: [name], MD;  Location: [facility];  Service: Urology;  Laterality: Bilateral;  . CYSTOSCOPY W/ URETERAL STENT PLACEMENT Bilateral 12/27/2017   Procedure: CYSTOSCOPY WITH RETROGRADE PYELOGRAM/URETERAL STENT PLACEMENT;  Surgeon: [name], MD;  Location: [facility];  Service: Urology;  Laterality: Bilateral;  . CYSTOSCOPY W/ URETERAL STENT PLACEMENT Bilateral 07/18/2018   Procedure: CYSTOSCOPY WITH STENT REPLACEMENT (exchange);  Surgeon: [name], MD;  Location: [facility];  Service: Urology;  Laterality: Bilateral;  . CYSTOSCOPY WITH STENT PLACEMENT Bilateral 11/12/2018   Procedure: [place] WITH STENT Exchange;  Surgeon: [name], MD;  Location: [facility];  Service: Urology;  Laterality: Bilateral;  . INTRAMEDULLARY (IM) NAIL INTERTROCHANTERIC Right 05/02/2018   Procedure: INTRAMEDULLARY (IM) NAIL INTERTROCHANTRIC;  Surgeon: [name], MD;  Location: [facility];  Service: Orthopedics;  Laterality: Right;  . IR NEPHROSTOMY EXCHANGE LEFT  03/25/2019  . IR NEPHROSTOMY EXCHANGE LEFT  04/19/2019  . IR NEPHROSTOMY EXCHANGE LEFT  05/31/2019  . IR NEPHROSTOMY EXCHANGE LEFT  07/19/2019  . IR NEPHROSTOMY EXCHANGE LEFT  09/13/2019  . IR NEPHROSTOMY EXCHANGE LEFT  11/22/2019  . IR NEPHROSTOMY EXCHANGE RIGHT  03/25/2019  . IR NEPHROSTOMY EXCHANGE RIGHT  04/19/2019  . IR NEPHROSTOMY EXCHANGE RIGHT  05/31/2019  . IR NEPHROSTOMY EXCHANGE RIGHT  07/19/2019  . IR NEPHROSTOMY EXCHANGE RIGHT  09/13/2019  . IR NEPHROSTOMY EXCHANGE RIGHT  11/22/2019  . IR NEPHROSTOMY PLACEMENT LEFT  02/23/2019  . IR NEPHROSTOMY PLACEMENT RIGHT  02/23/2019  . LAPAROTOMY N/A 06/08/2019   Procedure: EXPLORATORY LAPAROTOMY;  Surgeon: Herbert Pun, MD;  Location: [facility];  Service: General;  Laterality: N/A;  . LEG TENDON SURGERY Right 1958  . PORTA CATH INSERTION N/A 01/22/2018   Procedure: PORTA CATH INSERTION;  Surgeon: [name],  MD;  Location: [facility];  Service: Cardiovascular;  Laterality: N/A;  . TRANSURETHRAL RESECTION OF BLADDER TUMOR N/A 12/27/2017   Procedure: TRANSURETHRAL RESECTION OF BLADDER TUMOR (TURBT);  Surgeon: [name], MD;  Location: [facility];  Service: Urology;  Laterality: N/A;  . TRANSURETHRAL RESECTION OF BLADDER TUMOR N/A 01/15/2018   Procedure: TRANSURETHRAL RESECTION OF BLADDER TUMOR (TURBT);  Surgeon: [name], MD;  Location: [facility];  Service: Urology;  Laterality: N/A;  Need 2 hrs for this case please  . TRANSURETHRAL RESECTION OF BLADDER TUMOR N/A 07/18/2018   Procedure: TRANSURETHRAL RESECTION OF BLADDER TUMOR (TURBT);  Surgeon: [name], MD;  Location: [facility];  Service: Urology;  Laterality: N/A;    SOCIAL HISTORY:   Social History   Tobacco Use  . Smoking status: Never Smoker  . Smokeless tobacco: Never Used  Substance Use Topics  . Alcohol use: No    Alcohol/week: 0.0 standard drinks    FAMILY HISTORY:   Family History  Problem Relation Age of Onset  . Cancer Mother   . Chronic Renal Failure Mother   . Heart disease Father     DRUG ALLERGIES:  No Known Allergies  REVIEW OF SYSTEMS:   ROS As per history of present illness. All pertinent systems were reviewed above. Constitutional,  HEENT, cardiovascular, respiratory, GI,  GU, musculoskeletal, neuro, psychiatric, endocrine,  integumentary and hematologic systems were reviewed and are otherwise  negative/unremarkable except for positive findings mentioned above in the HPI.   MEDICATIONS AT HOME:   Prior to Admission medications   Medication Sig Start Date End Date Taking? Authorizing Provider  acetaminophen (TYLENOL) 500 MG tablet Take 1,000 mg by mouth every 6 (six) hours as needed for moderate pain or headache.     [provider]  cetirizine (ZYRTEC) 10 MG tablet Take 10 mg by mouth daily as needed for allergies.     [provider]  ciprofloxacin (CIPRO) 500 MG  tablet Take 1 tablet (500 mg total) by mouth 2 (two) times daily for 3 days. Begin medication on 11/21/2019 11/21/19 11/24/19  [name], MD  Cranberry-Cholecalciferol (SUPER CRANBERRY/VITAMIN D3) 4200-500 MG-UNIT CAPS Take 1 capsule by mouth 2 (two) times daily.    [provider]  dexamethasone (DECADRON) 4 MG tablet Take 2 tablets (8 mg total) by mouth daily. Start the day after chemotherapy for 2 days. 08/01/19   Sindy Guadeloupe, MD  feeding supplement, ENSURE ENLIVE, (ENSURE ENLIVE) LIQD Take 237 mLs by mouth 2 (two) times daily between meals. 06/11/19   Bettey Costa, MD  ferrous sulfate 325 (65 FE) MG tablet Take 1 tablet (325 mg total) by mouth 2 (two) times daily with a meal. 05/06/18   Gouru, Aruna, MD  Glycerin-Hypromellose-PEG 400 (DRY EYE RELIEF DROPS OP) Apply 1 drop to eye daily as needed.     [provider]  lidocaine-prilocaine (EMLA) cream Apply 1 application topically as needed. 01/02/19   Sindy Guadeloupe, MD  Multiple Vitamin (MULTIVITAMIN WITH MINERALS) TABS tablet Take 1 tablet by mouth daily.    [provider]  MYRBETRIQ 50 MG TB24 tablet TAKE 1 TABLET BY MOUTH DAILY Patient taking differently: Take 50 mg by mouth daily.  04/30/19   Zara Council A, PA-C  nystatin [facility]) powder Apply topically 2 (two) times daily. Patient taking differently: Apply 1 g topically 2 (two) times daily as needed.  09/28/18   Zara Council A, PA-C  ondansetron (ZOFRAN) 8 MG tablet Take 1 tablet (8 mg total) by mouth 2 (two) times daily as needed for refractory nausea / vomiting. Start on day 3 after chemo. Patient not taking: Reported on 11/11/2019 08/01/19   Sindy Guadeloupe, MD  oxyCODONE (OXY IR/ROXICODONE) 5 MG immediate release tablet TAKE 1 TABLET BY MOUTH EVERY 4 HOURS AS NEEDED FOR SEVERE PAIN 11/05/19   Sindy Guadeloupe, MD  polyethylene glycol powder (MIRALAX) powder Take 17 g by mouth daily as needed. Can increase to 3 times a day as needed for constipation but hold  medication if has diarrhea 01/30/18   Sindy Guadeloupe, MD  prochlorperazine (COMPAZINE) 10 MG tablet Take 1 tablet (10 mg total) by mouth every 6 (six) hours as needed (Nausea or vomiting). Patient not taking: Reported on 11/11/2019 08/01/19   Sindy Guadeloupe, MD  vitamin B-12 (CYANOCOBALAMIN) 500 MCG tablet Take 500 mcg by mouth daily.    [provider]  zinc oxide 20 % ointment Apply 1 application topically as needed for irritation.    [provider]      VITAL SIGNS:  Blood pressure 131/60, pulse (!) 101, temperature 98.3 F (36.8 C), temperature source Oral, resp. rate 16, height 6' (1.829 m), weight 68 kg, SpO2 97 %.  PHYSICAL EXAMINATION:  Physical Exam  GENERAL:  85 y.o.-year-old Caucasian male patient lying in the bed with  no acute distress.  EYES: Pupils equal, round, reactive to light and accommodation. No scleral icterus. Extraocular muscles intact.  HEENT: Head atraumatic, normocephalic. Oropharynx and nasopharynx clear.  NECK:  Supple, no jugular venous distention. No thyroid enlargement, no tenderness.  LUNGS: Normal breath sounds bilaterally, no wheezing, rales,rhonchi or crepitation. No use of accessory muscles of respiration.  CARDIOVASCULAR: Regular rate and rhythm, S1, S2 normal. No murmurs, rubs, or gallops.  ABDOMEN: Soft, nondistended with lower abdominal erythema surrounding his urine bag and as stated tenderness with induration and warmth on the right side.  Colostomy was in place with brown stools in its pad.  Bowel sounds present. No organomegaly.  No palpable other masses. EXTREMITIES: No pedal edema, cyanosis, or clubbing.  NEUROLOGIC: Cranial nerves II through XII are intact. Muscle strength 5/5 in all extremities. Sensation intact. Gait not checked.  PSYCHIATRIC: The patient is alert and oriented x 3.  Normal affect and good eye contact. SKIN: No obvious rash, lesion, or ulcer.   LABORATORY PANEL:   CBC Recent Labs  Lab 11/23/19 1337  WBC  28.6*  HGB 12.2*  HCT 39.0  PLT 469*   ------------------------------------------------------------------------------------------------------------------  Chemistries  Recent Labs  Lab 11/23/19 1337  NA 136  K 3.7  CL 98  CO2 28  GLUCOSE 143*  BUN 22  CREATININE 1.01  CALCIUM 9.4  AST 15  ALT 12  ALKPHOS 88  BILITOT 0.5   ------------------------------------------------------------------------------------------------------------------  Cardiac Enzymes No results for input(s): TROPONINI in the last 168 hours. ------------------------------------------------------------------------------------------------------------------  RADIOLOGY:  CT ABDOMEN PELVIS W CONTRAST  Result Date: 11/23/2019 CLINICAL DATA:  Concern for abscess or infection on adjacent the inferior stoma site EXAM: CT ABDOMEN AND PELVIS WITH CONTRAST TECHNIQUE: Multidetector CT imaging of the abdomen and pelvis was performed using the standard protocol following bolus administration of intravenous contrast. CONTRAST:  123mL OMNIPAQUE IOHEXOL 300 MG/ML  SOLN COMPARISON:  Radiograph 10/13/2019 FINDINGS: Lower chest: Bilateral effusions, small on the right and trace on the left, similar to slightly decreased in size from comparison study. Adjacent passive atelectasis with more bandlike areas of scarring and/or subsegmental atelectatic changes well. Normal heart size. No pericardial effusion. Few coronary artery calcifications are noted. Hepatobiliary: No focal liver abnormality is seen. Patient is post cholecystectomy. Slight prominence of the biliary tree likely related to reservoir effect. No calcified intraductal gallstones. Pancreas: Unremarkable. No pancreatic ductal dilatation or surrounding inflammatory changes. Spleen: Normal in size without focal abnormality. Adrenals/Urinary Tract: Normal adrenal glands bilateral percutaneous nephrostomy tubes are in place. Pigtail catheter tips terminate appropriately within the  proximal collecting systems. Some mild urothelial thickening is noted in both renal pelves, nonspecific. Redemonstration of the large complex centrally necrotic, enhancing mass lesion arising from the region of the bladder and extending directly through the anterior abdominal wall. There is diminished intraluminal gas but with increasing cystic components of this lesion which measures approximately 12.2 x 10.4 x 13 cm in size. Visible necked connection with the urinary bladder is noted posteriorly (2/71) with extension to the anterior midline urostomy (2/66). Surrounding phlegmonous changes noted with adjacent reactive free fluid as well. Stomach/Bowel: Distal esophagus, stomach and duodenal sweep are unremarkable. No small bowel wall thickening or dilatation. No evidence of obstruction. A normal appendix is visualized. Diverting loop colostomy noted in the mid abdomen (2/26) distal colon demonstrates some scattered colonic diverticula without focal pericolonic inflammation the suggest diverticulitis. More diffuse segmental thickening is noted in the sigmoid colon as o'clock passes in close proximity to the heterogeneous  bladder mass detailed above, the significance of which is indeterminate. Inspissated stool ball is noted within the rectum. Vascular/Lymphatic: Atherosclerotic plaque within the normal caliber aorta. Prominent retroperitoneal nodes are present though no grossly enlarged abdominopelvic adenopathy is seen. Reproductive: Postsurgical changes noted of the mildly enlarged prostate. Keyhole defect within the prostate could reflect sequela of prior TURP. Direct involvement of the prostate with the large, heterogeneous bladder mass. Other: Reactive free fluid layering in the deep pelvis, similar in extent to comparison. 4 Musculoskeletal: The osseous structures appear diffusely demineralized which may limit detection of small or nondisplaced fractures. Multilevel degenerative changes are present in the  imaged portions of the spine. Bony fusion of L1-2 L3. 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 Mild canal stenosis and moderate bilateral neural foraminal narrowing at L5-S1. Severe degenerative changes noted in both hips. More moderate degenerative change noted in the SI joints and pubic symphysis. Prior right femoral intramedullary nail and transcervical fixation. IMPRESSION: 1. Redemonstration of the large complex centrally necrotic, enhancing mass arising from the region of the bladder and extending directly through the anterior abdominal wall. There is diminished intraluminal gas but with increasing cystic components of this lesion which measures approximately 12.2 x 10.4 x 13 cm in size. Visible connection with the urinary bladder is noted posteriorly with extension to the anterior midline urostomy. Surrounding phlegmonous changes noted with adjacent reactive free fluid as well. 2. Bilateral percutaneous nephrostomy tubes are in place with mild urothelial thickening in both renal pelves, nonspecific and possibly reactive due to the presence of the stents versus secondary to urinary tract infection. 3. Bilateral pleural effusions, small on the right and trace on the left, similar to slightly decreased in size from comparison study. 4. Distal colonic thickening within the sigmoid in close proximity to the bladder mass, as detailed above. Significance of which is indeterminate. Diverting loop colostomy in place. 5. Inspissated stool ball within the rectum may represent fecal impaction. Electronically Signed   By: Lovena Le M.D.   On: 11/23/2019 18:37   IR NEPHROSTOMY EXCHANGE LEFT  Result Date: 11/22/2019 INDICATION: History of bladder cancer with chronic bilateral percutaneous nephrostomy catheters. Patient presents today for routine fluoroscopic guided exchange. EXAM: FLUOROSCOPIC GUIDED BILATERAL SIDED NEPHROSTOMY CATHETER EXCHANGE COMPARISON:  Multiple previous fluoroscopic  guided nephrostomy catheter exchanges, most recently on 09/13/2019; CT abdomen pelvis-10/13/2019 CONTRAST:  A total of 20 mL Isovue-300 administered was administered into both collecting systems FLUOROSCOPY TIME:  1 minute, 24 seconds (23 mGy) COMPLICATIONS: None immediate. TECHNIQUE: Informed written consent was obtained from the patient after a discussion of the risks, benefits and alternatives to treatment. Questions regarding the procedure were encouraged and answered. A timeout was performed prior to the initiation of the procedure. The bilateral flanks and external portions of existing nephrostomy catheters were prepped and draped in the usual sterile fashion. A sterile drape was applied covering the operative field. Maximum barrier sterile technique with sterile gowns and gloves were used for the procedure. A timeout was performed prior to the initiation of the procedure. A pre procedural spot fluoroscopic image was obtained. Beginning with the left-sided nephrostomy, a small amount of contrast was injected via the existing left-sided nephrostomy catheter demonstrating appropriate positioning within the renal pelvis. The existing nephrostomy catheter was cut and cannulated with a Benson wire which was coiled within the renal pelvis. Under intermittent fluoroscopic guidance, the existing nephrostomy catheter was exchanged for a new 12 [place] all-purpose drainage catheter. Limited contrast injection confirmed appropriate positioning within the left  renal pelvis and a post exchange fluoroscopic image was obtained. The catheter was locked, secured to the skin with an interrupted suture and reconnected to a gravity bag. The identical repeat procedure was repeated for the contralateral right-sided nephrostomy, ultimately allowing successful exchange of a new 10.2 [place] all-purpose drainage catheter with end coiled and locked within the right renal pelvis. Dressings were placed. The patient tolerated the above  procedures well without immediate postprocedural complication. FINDINGS: The existing nephrostomy catheters are appropriately positioned and functioning. After successful fluoroscopic guided exchange, new bilateral 12 French nephrostomy catheters are coiled and locked within the respective renal pelvises. IMPRESSION: Successful fluoroscopic guided exchange of bilateral 12 French percutaneous nephrostomy catheters. Electronically Signed   By: Sandi Mariscal M.D.   On: 11/22/2019 10:54   IR NEPHROSTOMY EXCHANGE RIGHT  Result Date: 11/22/2019 INDICATION: History of bladder cancer with chronic bilateral percutaneous nephrostomy catheters. Patient presents today for routine fluoroscopic guided exchange. EXAM: FLUOROSCOPIC GUIDED BILATERAL SIDED NEPHROSTOMY CATHETER EXCHANGE COMPARISON:  Multiple previous fluoroscopic guided nephrostomy catheter exchanges, most recently on 09/13/2019; CT abdomen pelvis-10/13/2019 CONTRAST:  A total of 20 mL Isovue-300 administered was administered into both collecting systems FLUOROSCOPY TIME:  1 minute, 24 seconds (23 mGy) COMPLICATIONS: None immediate. TECHNIQUE: Informed written consent was obtained from the patient after a discussion of the risks, benefits and alternatives to treatment. Questions regarding the procedure were encouraged and answered. A timeout was performed prior to the initiation of the procedure. The bilateral flanks and external portions of existing nephrostomy catheters were prepped and draped in the usual sterile fashion. A sterile drape was applied covering the operative field. Maximum barrier sterile technique with sterile gowns and gloves were used for the procedure. A timeout was performed prior to the initiation of the procedure. A pre procedural spot fluoroscopic image was obtained. Beginning with the left-sided nephrostomy, a small amount of contrast was injected via the existing left-sided nephrostomy catheter demonstrating appropriate positioning within  the renal pelvis. The existing nephrostomy catheter was cut and cannulated with a Benson wire which was coiled within the renal pelvis. Under intermittent fluoroscopic guidance, the existing nephrostomy catheter was exchanged for a new 12 [place] all-purpose drainage catheter. Limited contrast injection confirmed appropriate positioning within the left renal pelvis and a post exchange fluoroscopic image was obtained. The catheter was locked, secured to the skin with an interrupted suture and reconnected to a gravity bag. The identical repeat procedure was repeated for the contralateral right-sided nephrostomy, ultimately allowing successful exchange of a new 10.2 [place] all-purpose drainage catheter with end coiled and locked within the right renal pelvis. Dressings were placed. The patient tolerated the above procedures well without immediate postprocedural complication. FINDINGS: The existing nephrostomy catheters are appropriately positioned and functioning. After successful fluoroscopic guided exchange, new bilateral 12 French nephrostomy catheters are coiled and locked within the respective renal pelvises. IMPRESSION: Successful fluoroscopic guided exchange of bilateral 12 French percutaneous nephrostomy catheters. Electronically Signed   By: Sandi Mariscal M.D.   On: 11/22/2019 10:54      IMPRESSION AND PLAN:   1.  Abdominal wall severe purulent cellulitis with associated phlegmon.  Patient will be admitted to a medical monitored bed given associated sepsis.  We will continue antibiotic therapy with IV vancomycin per protocol.  Will obtain wound Gram stain culture and sensitivity.  I will add IV Zosyn as I believe we need to cover for anaerobes and gram negatives given the extent of his suspected intra-abdominal infection.  A general surgery consultation be  obtained.  Pain management will be provided.  Warm compresses will be applied.  2.  Sepsis secondary to #1.  This is manifested by tachycardia and  significant leukocytosis.  He has had previous worse leukocytosis with previous similar cellulitis.  Will follow blood and wound culture and placed on IV vancomycin as mentioned above.  3.  Hypertension.  It is currently fairly controlled.  He will be placed on as needed IV labetalol.  4.  Locally invasive bladder cancer.  Pain management will be provided.  The patient had palliative care consult but is not currently on hospice.  He requested aggressive management.  5.  Vitamin B12 deficiency.  We will continue vitamin B12.  6.  DVT prophylaxis.  Subcutaneous Lovenox.    All the records are reviewed and case discussed with ED provider. The plan of care was discussed in details with the patient (and family). I answered all questions. The patient agreed to proceed with the above mentioned plan. Further management will depend upon hospital course.   CODE STATUS: The patient is DNR/DNI.  TOTAL TIME TAKING CARE OF THIS PATIENT: 50 minutes.    Christel Mormon M.D on 11/23/2019 at 8:49 PM  [facility]   From 7 PM-7 AM, contact night-coverage [email or web address]  CC: Primary care physician; [name], MD   Note: This dictation was prepared with Dragon dictation along with smaller phrase technology. Any transcriptional errors that result from this process are unintentional.

## 2019-11-23 NOTE — ED Provider Notes (Signed)
[facility] Emergency Department Provider Note ____________________________________________   First MD Initiated Contact with Patient 11/23/19 1703     (approximate)  I have reviewed the triage vital signs and the nursing notes.   HISTORY  Chief Complaint Abscess  Level 5 caveat: History of present illness limited due to poor historian  HPI Vincent Poole is a 85 y.o. male with PMH as noted below including locally advanced urothelial cancer, nephrostomies, and bowel obstruction status post diverting colostomy who presents with pain and drainage near one of his colostomy sites.  The patient states that he believes that the bag might have been dislodged when he was being moved yesterday.  He has noted leakage near the ostomy site and pain as well.  Past Medical History:  Diagnosis Date  . Anxiety   . Arthritis   . Bladder cancer ([place])   . Dysrhythmia 07/2018   history of atrial flutter that worsens with anxiety  . Femur fracture, right ([place]) 05/01/2018  . GERD (gastroesophageal reflux disease)   . History of recent blood transfusion 05/2018  . Hypertension   . Iron deficiency anemia 09/14/2018  . Prostate cancer ([place]) 07/2018   cancer growing in prostate but not prostate cancer, it is from the bladder  . Umbilical hernia [id number]  . Urinary retention 2019   foley catheter place 11/2017  . UTI (urinary tract infection) 2019   frequent UTI's over last year  . Wound eschar of foot 07/2018   left heal getting wrapped and requiring antibiotic cream. cracks open with weight bearing.    Patient Active Problem List   Diagnosis Date Noted  . Abdominal wall cellulitis 11/23/2019  . Intra-abdominal abscess ([place])   . Intra-abdominal infection 10/05/2019  . S/P partial resection of colon 07/17/2019  . Protein-calorie malnutrition, severe 06/11/2019  . Abdominal pain, generalized   . Palliative care by specialist   . Urothelial cancer ([place])   . Weakness     . Large bowel obstruction ([place]) 06/06/2019  . Hydronephrosis, right 04/08/2019  . Pressure injury of skin 03/23/2019  . SIRS (systemic inflammatory response syndrome) ([place]) 03/22/2019  . Leukocytosis 02/21/2019  . Attention to nephrostomy ([place]) 11/22/2018  . Complicated UTI (urinary tract infection) 11/14/2018  . Palliative care encounter   . Iron deficiency anemia secondary to inadequate dietary iron intake 06/27/2018  . B12 deficiency 06/27/2018  . History of fracture of right hip 06/26/2018  . History of normocytic normochromic anemia 06/26/2018  . Personal history of bladder cancer 06/26/2018  . Hip fracture ([place]) 05/01/2018  . Malignant neoplasm of urinary bladder ([place]) 01/12/2018  . Goals of care, counseling/discussion 01/12/2018  . History of recurrent UTIs 08/03/2017  . Pyelonephritis 05/31/2017  . UTI (urinary tract infection) 03/04/2017  . Urinary obstruction   . Essential hypertension 08/30/2016  . History of shingles 08/30/2016  . Prostate cancer ([place]) 08/30/2016  . Urinary retention 08/30/2016  . Medicare annual wellness visit, initial 08/01/2016  . Medicare annual wellness visit, subsequent 08/01/2016  . Sepsis ([place]) 07/11/2016  . Borderline diabetes mellitus 01/26/2016  . Vaccine counseling 01/26/2015  . Lumbar radiculitis 06/24/2014  . OA (osteoarthritis) of hip 06/24/2014  . Malignant neoplasm of prostate ([place]) 01/27/2011    Past Surgical History:  Procedure Laterality Date  . CARPAL TUNNEL RELEASE Right   . CHOLECYSTECTOMY  2004  . COLOSTOMY N/A 06/08/2019   Procedure: COLOSTOMY;  Surgeon: Herbert Pun, MD;  Location: [facility];  Service: General;  Laterality: N/A;  .  CYSTOGRAM  07/18/2018   Procedure: CYSTOGRAM;  Surgeon: [name], MD;  Location: [facility];  Service: Urology;;  . Consuela Mimes W/ RETROGRADES Bilateral 07/18/2018   Procedure: CYSTOSCOPY WITH RETROGRADE PYELOGRAM;  Surgeon: [name], MD;  Location: [facility];  Service:  Urology;  Laterality: Bilateral;  . CYSTOSCOPY W/ URETERAL STENT PLACEMENT Bilateral 12/27/2017   Procedure: CYSTOSCOPY WITH RETROGRADE PYELOGRAM/URETERAL STENT PLACEMENT;  Surgeon: [name], MD;  Location: [facility];  Service: Urology;  Laterality: Bilateral;  . CYSTOSCOPY W/ URETERAL STENT PLACEMENT Bilateral 07/18/2018   Procedure: CYSTOSCOPY WITH STENT REPLACEMENT (exchange);  Surgeon: [name], MD;  Location: [facility];  Service: Urology;  Laterality: Bilateral;  . CYSTOSCOPY WITH STENT PLACEMENT Bilateral 11/12/2018   Procedure: [place] WITH STENT Exchange;  Surgeon: [name], MD;  Location: [facility];  Service: Urology;  Laterality: Bilateral;  . INTRAMEDULLARY (IM) NAIL INTERTROCHANTERIC Right 05/02/2018   Procedure: INTRAMEDULLARY (IM) NAIL INTERTROCHANTRIC;  Surgeon: [name], MD;  Location: [facility];  Service: Orthopedics;  Laterality: Right;  . IR NEPHROSTOMY EXCHANGE LEFT  03/25/2019  . IR NEPHROSTOMY EXCHANGE LEFT  04/19/2019  . IR NEPHROSTOMY EXCHANGE LEFT  05/31/2019  . IR NEPHROSTOMY EXCHANGE LEFT  07/19/2019  . IR NEPHROSTOMY EXCHANGE LEFT  09/13/2019  . IR NEPHROSTOMY EXCHANGE LEFT  11/22/2019  . IR NEPHROSTOMY EXCHANGE RIGHT  03/25/2019  . IR NEPHROSTOMY EXCHANGE RIGHT  04/19/2019  . IR NEPHROSTOMY EXCHANGE RIGHT  05/31/2019  . IR NEPHROSTOMY EXCHANGE RIGHT  07/19/2019  . IR NEPHROSTOMY EXCHANGE RIGHT  09/13/2019  . IR NEPHROSTOMY EXCHANGE RIGHT  11/22/2019  . IR NEPHROSTOMY PLACEMENT LEFT  02/23/2019  . IR NEPHROSTOMY PLACEMENT RIGHT  02/23/2019  . LAPAROTOMY N/A 06/08/2019   Procedure: EXPLORATORY LAPAROTOMY;  Surgeon: Herbert Pun, MD;  Location: [facility];  Service: General;  Laterality: N/A;  . LEG TENDON SURGERY Right 1958  . PORTA CATH INSERTION N/A 01/22/2018   Procedure: PORTA CATH INSERTION;  Surgeon: [name], MD;  Location: [facility];  Service: Cardiovascular;  Laterality: N/A;  . TRANSURETHRAL RESECTION OF BLADDER TUMOR N/A  12/27/2017   Procedure: TRANSURETHRAL RESECTION OF BLADDER TUMOR (TURBT);  Surgeon: [name], MD;  Location: [facility];  Service: Urology;  Laterality: N/A;  . TRANSURETHRAL RESECTION OF BLADDER TUMOR N/A 01/15/2018   Procedure: TRANSURETHRAL RESECTION OF BLADDER TUMOR (TURBT);  Surgeon: [name], MD;  Location: [facility];  Service: Urology;  Laterality: N/A;  Need 2 hrs for this case please  . TRANSURETHRAL RESECTION OF BLADDER TUMOR N/A 07/18/2018   Procedure: TRANSURETHRAL RESECTION OF BLADDER TUMOR (TURBT);  Surgeon: [name], MD;  Location: [facility];  Service: Urology;  Laterality: N/A;    Prior to Admission medications   Medication Sig Start Date End Date Taking? Authorizing Provider  ciprofloxacin (CIPRO) 500 MG tablet Take 1 tablet (500 mg total) by mouth 2 (two) times daily for 3 days. Begin medication on 11/21/2019 11/21/19 11/24/19 Yes [name], MD  acetaminophen (TYLENOL) 500 MG tablet Take 1,000 mg by mouth every 6 (six) hours as needed for moderate pain or headache.     [provider]  cetirizine (ZYRTEC) 10 MG tablet Take 10 mg by mouth daily as needed for allergies.     [provider]  Cranberry-Cholecalciferol (SUPER CRANBERRY/VITAMIN D3) 4200-500 MG-UNIT CAPS Take 1 capsule by mouth 2 (two) times daily.    [provider]  dexamethasone (DECADRON) 4 MG tablet Take 2 tablets (8 mg total) by mouth daily. Start the day  after chemotherapy for 2 days. 08/01/19   Sindy Guadeloupe, MD  feeding supplement, ENSURE ENLIVE, (ENSURE ENLIVE) LIQD Take 237 mLs by mouth 2 (two) times daily between meals. 06/11/19   Bettey Costa, MD  ferrous sulfate 325 (65 FE) MG tablet Take 1 tablet (325 mg total) by mouth 2 (two) times daily with a meal. 05/06/18   Gouru, Aruna, MD  Glycerin-Hypromellose-PEG 400 (DRY EYE RELIEF DROPS OP) Apply 1 drop to eye daily as needed.     [provider]  lidocaine-prilocaine (EMLA) cream Apply 1 application topically as  needed. 01/02/19   Sindy Guadeloupe, MD  Multiple Vitamin (MULTIVITAMIN WITH MINERALS) TABS tablet Take 1 tablet by mouth daily.    [provider]  MYRBETRIQ 50 MG TB24 tablet TAKE 1 TABLET BY MOUTH DAILY Patient taking differently: Take 50 mg by mouth daily.  04/30/19   Zara Council A, PA-C  nystatin [facility]) powder Apply topically 2 (two) times daily. Patient taking differently: Apply 1 g topically 2 (two) times daily as needed.  09/28/18   Zara Council A, PA-C  ondansetron (ZOFRAN) 8 MG tablet Take 1 tablet (8 mg total) by mouth 2 (two) times daily as needed for refractory nausea / vomiting. Start on day 3 after chemo. Patient not taking: Reported on 11/11/2019 08/01/19   Sindy Guadeloupe, MD  oxyCODONE (OXY IR/ROXICODONE) 5 MG immediate release tablet TAKE 1 TABLET BY MOUTH EVERY 4 HOURS AS NEEDED FOR SEVERE PAIN 11/05/19   Sindy Guadeloupe, MD  polyethylene glycol powder (MIRALAX) powder Take 17 g by mouth daily as needed. Can increase to 3 times a day as needed for constipation but hold medication if has diarrhea 01/30/18   Sindy Guadeloupe, MD  prochlorperazine (COMPAZINE) 10 MG tablet Take 1 tablet (10 mg total) by mouth every 6 (six) hours as needed (Nausea or vomiting). Patient not taking: Reported on 11/11/2019 08/01/19   Sindy Guadeloupe, MD  vitamin B-12 (CYANOCOBALAMIN) 500 MCG tablet Take 500 mcg by mouth daily.    [provider]  zinc oxide 20 % ointment Apply 1 application topically as needed for irritation.    [provider]    Allergies Patient has no known allergies.  Family History  Problem Relation Age of Onset  . Cancer Mother   . Chronic Renal Failure Mother   . Heart disease Father     Social History Social History   Tobacco Use  . Smoking status: Never Smoker  . Smokeless tobacco: Never Used  Substance Use Topics  . Alcohol use: No    Alcohol/week: 0.0 standard drinks  . Drug use: No    Review of Systems Level 5 caveat: History of  present illness limited to poor historian    ____________________________________________   PHYSICAL EXAM:  VITAL SIGNS: ED Triage Vitals  Enc Vitals Group     BP 11/23/19 1326 125/63     Pulse Rate 11/23/19 1326 (!) 118     Resp 11/23/19 1326 18     Temp 11/23/19 1326 98 F (36.7 C)     Temp Source 11/23/19 1326 Oral     SpO2 11/23/19 1326 98 %     Weight 11/23/19 1328 150 lb (68 kg)     Height 11/23/19 1328 6' (1.829 m)     Head Circumference --      Peak Flow --      Pain Score 11/23/19 1327 10     Pain Loc --  Pain Edu? --      Excl. in [place]? --     Constitutional: Alert and oriented.  Frail appearing but in no acute distress. Eyes: Conjunctivae are normal.  Head: Atraumatic. Nose: No congestion/rhinnorhea. Mouth/Throat: Mucous membranes are moist.   Neck: Normal range of motion.  Cardiovascular: Tachycardic, regular rhythm. Good peripheral circulation. Respiratory: Normal respiratory effort.  No retractions. Gastrointestinal: 2 anterior abdominal colostomy sites clean and intact.  Approximately 5 cm area of erythema, induration, and warmth just to the right of the inferior ostomy site with a trace amount of purulent drainage.  Moderately tender to this area locally.  The remainder of the abdomen is soft and nontender. Genitourinary: No flank tenderness. Musculoskeletal: No lower extremity edema.  Extremities warm and well perfused.  Neurologic: Motor intact in all extremities. Skin:  Skin is warm and dry. No rash except for right lower abdominal area as noted above. Psychiatric: Calm and cooperative.  ____________________________________________   LABS (all labs ordered are listed, but only abnormal results are displayed)  Labs Reviewed  LACTIC ACID, PLASMA - Abnormal; Notable for the following components:      Result Value   Lactic Acid, Venous 2.3 (*)    All other components within normal limits  LACTIC ACID, PLASMA - Abnormal; Notable for the following  components:   Lactic Acid, Venous 2.0 (*)    All other components within normal limits  COMPREHENSIVE METABOLIC PANEL - Abnormal; Notable for the following components:   Glucose, Bld 143 (*)    Albumin 3.1 (*)    All other components within normal limits  CBC WITH DIFFERENTIAL/PLATELET - Abnormal; Notable for the following components:   WBC 28.6 (*)    RBC 4.18 (*)    Hemoglobin 12.2 (*)    RDW 16.8 (*)    Platelets 469 (*)    Neutro Abs 24.3 (*)    Monocytes Absolute 1.3 (*)    Abs Immature Granulocytes 0.16 (*)    All other components within normal limits  CULTURE, BLOOD (ROUTINE X 2)  CULTURE, BLOOD (ROUTINE X 2)  SARS CORONAVIRUS 2 (TAT 6-24 HRS)  CULTURE, BLOOD (ROUTINE X 2)  CULTURE, BLOOD (ROUTINE X 2)  URINALYSIS, COMPLETE (UACMP) WITH MICROSCOPIC  BASIC METABOLIC PANEL  BASIC METABOLIC PANEL  CBC   ____________________________________________  EKG   ____________________________________________  RADIOLOGY  CT abdomen: Redemonstrated anterior abdominal mass with no discrete fluid collection, but some surrounding phlegmonous change  ____________________________________________   PROCEDURES  Procedure(s) performed: No  Procedures  Critical Care performed: No ____________________________________________   INITIAL IMPRESSION / ASSESSMENT AND PLAN / ED COURSE  Pertinent labs & imaging results that were available during my care of the patient were reviewed by me and considered in my medical decision making (see chart for details).  85 year old male with PMH as noted above presents with pain and drainage to an area of the right lower abdominal wall adjacent to an ostomy site.  The patient is a poor historian and unable to give too many details, but feels like the symptoms started over the last day and he was worried that the bag was dislodged.  I reviewed the past medical records in [place].  The patient follows with oncology for locally advanced urothelial  cancer.  He is status post bilateral nephrostomy as well as a malignant bowel obstruction and diverting colostomy.  He has recently been evaluated by palliative care.  He is not on hospice at this time.  He had a double barrel colostomy  placed in August 2020.  He was admitted in December 2020 with a very similar presentation of pain and drainage from the lower anterior abdominal wall.  Work-up at that time determined that the bladder mass was eroding into the anterior abdominal wall, and he had a superficial skin abscess on top of this.  He was treated with IV antibiotics.  On exam, the patient is tachycardic but afebrile and with otherwise normal vital signs.  He is frail but relatively comfortable appearing.  The 2 ostomy sites on his anterior abdomen appear clean and intact with the bags in place.  However, just adjacent to the inferior ostomy (from the bladder) there is an approximately 5 cm area of induration, warmth, and erythema and evidence of some recent purulent drainage.  Overall presentation is consistent with cellulitis versus recurrent/persistent of the anterior abdominal wall.  There does not appear to be a complication involving the colostomy itself, although I cannot rule out fistula.  We will obtain a CT to further evaluate.  The patient does have evidence of sepsis with tachycardia and elevated lactate (although his WBC count is trending down from prior) so we will obtain cultures and anticipate he will need admission for IV antibiotics.  ----------------------------------------- 10:29 PM on 11/23/2019 -----------------------------------------  CT did not demonstrate a discrete abscess that required drainage, but redemonstrated the lower anterior abdominal mass with surrounding inflammatory and phlegmonous change.  Given the patient's findings of sepsis with elevated WBC count and tachycardia, I admitted him to the hospitalist service for IV antibiotics and further  management.  ___________________________  [name] was evaluated in Emergency Department on 11/23/2019 for the symptoms described in the history of present illness. He was evaluated in the context of the global COVID-19 pandemic, which necessitated consideration that the patient might be at risk for infection with the SARS-CoV-2 virus that causes COVID-19. Institutional protocols and algorithms that pertain to the evaluation of patients at risk for COVID-19 are in a state of rapid change based on information released by regulatory bodies including the CDC and federal and state organizations. These policies and algorithms were followed during the patient's care in the ED. ____________________________________________   FINAL CLINICAL IMPRESSION(S) / ED DIAGNOSES  Final diagnoses:  Cellulitis of abdominal wall      NEW MEDICATIONS STARTED DURING THIS VISIT:  New Prescriptions   No medications on file     Note:  This document was prepared using Dragon voice recognition software and may include unintentional dictation errors.    Arta Silence, MD 11/23/19 2330

## 2019-11-23 NOTE — ED Notes (Signed)
Pt lying in bed, denies any pain at this time. Will continue to monitor

## 2019-11-23 NOTE — ED Notes (Signed)
Pt lying in bed asleep, eyes closed with equal unlabored RR> pt denies pain and has no other needs at this time

## 2019-11-23 NOTE — ED Notes (Signed)
Pt c/o pain to abd and requesting medication for sleep. See [facility]

## 2019-11-23 NOTE — ED Notes (Signed)
Pt taken to CT.

## 2019-11-24 ENCOUNTER — Encounter: Payer: Self-pay | Admitting: Family Medicine

## 2019-11-24 LAB — CBC
HCT: 31.2 % — ABNORMAL LOW (ref 39.0–52.0)
Hemoglobin: 9.8 g/dL — ABNORMAL LOW (ref 13.0–17.0)
MCH: 29.8 pg (ref 26.0–34.0)
MCHC: 31.4 g/dL (ref 30.0–36.0)
MCV: 94.8 fL (ref 80.0–100.0)
Platelets: 330 10*3/uL (ref 150–400)
RBC: 3.29 MIL/uL — ABNORMAL LOW (ref 4.22–5.81)
RDW: 16.7 % — ABNORMAL HIGH (ref 11.5–15.5)
WBC: 27.4 10*3/uL — ABNORMAL HIGH (ref 4.0–10.5)
nRBC: 0 % (ref 0.0–0.2)

## 2019-11-24 LAB — BASIC METABOLIC PANEL
Anion gap: 7 (ref 5–15)
BUN: 20 mg/dL (ref 8–23)
CO2: 27 mmol/L (ref 22–32)
Calcium: 8.8 mg/dL — ABNORMAL LOW (ref 8.9–10.3)
Chloride: 105 mmol/L (ref 98–111)
Creatinine, Ser: 1 mg/dL (ref 0.61–1.24)
GFR calc Af Amer: >60 mL/min (ref >60)
GFR calc non Af Amer: >60 mL/min (ref >60)
Glucose, Bld: 115 mg/dL — ABNORMAL HIGH (ref 70–99)
Potassium: 3.5 mmol/L (ref 3.5–5.1)
Sodium: 139 mmol/L (ref 135–145)

## 2019-11-24 LAB — URINALYSIS, COMPLETE (UACMP) WITH MICROSCOPIC
Bilirubin Urine: NEGATIVE
Glucose, UA: NEGATIVE mg/dL
Ketones, ur: NEGATIVE mg/dL
Nitrite: NEGATIVE
Protein, ur: 30 mg/dL — AB
Specific Gravity, Urine: >1.046 — ABNORMAL HIGH (ref 1.005–1.030)
Squamous Epithelial / HPF: NONE SEEN (ref 0–5)
WBC, UA: >50 WBC/hpf — ABNORMAL HIGH (ref 0–5)
pH: 5 (ref 5.0–8.0)

## 2019-11-24 LAB — SARS CORONAVIRUS 2 (TAT 6-24 HRS): SARS Coronavirus 2: NEGATIVE

## 2019-11-24 MED ORDER — CHLORHEXIDINE GLUCONATE CLOTH 2 % EX PADS
6.0000 | MEDICATED_PAD | Freq: Every day | CUTANEOUS | Status: DC
  Administered 2019-11-26: 6 via TOPICAL

## 2019-11-24 MED ORDER — VANCOMYCIN HCL 1500 MG/300ML IV SOLN
1500.0000 mg | INTRAVENOUS | Status: DC
  Administered 2019-11-25: 01:00:00 1500 mg via INTRAVENOUS
  Filled 2019-11-24 (×3): qty 300

## 2019-11-24 MED ORDER — MORPHINE SULFATE (PF) 2 MG/ML IV SOLN
2.0000 mg | INTRAVENOUS | Status: DC | PRN
  Administered 2019-11-24 – 2019-11-25 (×2): 2 mg via INTRAVENOUS
  Filled 2019-11-24 (×2): qty 1

## 2019-11-24 NOTE — ED Notes (Signed)
Pt lying in bed asleep (eyes closed with equal unlabored RR), nothing needed from staff at this time. Pt's VS are WNL

## 2019-11-24 NOTE — Progress Notes (Signed)
PROGRESS NOTE    Vincent Poole  EQA:[id number] DOB: 1935/03/10 DOA: 11/23/2019  PCP: [name], MD    LOS - 1   Brief Narrative:  85 year old male with history of locally invasive bladder cancer, prostate cancer hypertension, iron deficiency anemia, GERD.  He presented to the ED on 1/16 with lower abdominal pain, swelling and erythema and tenderness adjacent to his urostomy with urine leakage.   In the ED, tachycardic, normal.  Labs showed leukocytosis 28.6 neutrophil predominant lactate was 2.3 and repeat 2.0.  CT abdomen pelvis showed "redemonstration of the large complex centrally necrotic, enhancing mass arising from the region of the bladder and extending directly through the anterior abdominal wall. ...with increasing cystic components of this lesion which measures approximately 12.2 x 10.4 x 13 cm in size. Visible connection with the urinary bladder is noted posteriorly with extension to the anterior midline urostomy. Surrounding phlegmonous changes noted with adjacent reactive free fluid as well."  And other findings as outlined above.  Patient was treated with IV fluids,, Banken Rocephin and admitted to hospitalist service for further evaluation management.  Subjective: Patient seen today in the ED on hold for bed.  No acute events reported overnight.  He denies fevers or chills, nausea or vomiting, chest pain or shortness of breath. Does request pain hospital, appearing comfortable on ED stretcher.  Assessment & Plan:   Active Problems:   Abdominal wall cellulitis   Sepsis secondary to: Abdominal wall purulent cellulitis with associated phlegmon Sepsis present upon admission as evidenced by leukocytosis, tachycardia and cellulitis infection. -General surgery consulted -Continue Vanc and Zosyn for now -Pain management including warm compresses  Essential hypertension -chronic, stable Not on medication at home.  Labetalol as needed.    Locally invasive bladder  cancer --Pain control --Previously had palliative care consult but is now on hospice as he requested aggressive management  Vitamin B12 deficiency -continue supplement   DVT prophylaxis: Lovenox   Code Status: DNR  Family Communication: None at bedside Disposition Plan: Pending clinical improvement and PT eval   Consultants:   General surgery  Procedures:   None  Antimicrobials:   Vancomycin, start 1/16  Zosyn, start 1/16, stop TBD   Objective: Vitals:   11/23/19 1955 11/23/19 2122 11/24/19 0147 11/24/19 0655  BP: 131/60 117/63 (!) 116/53 (!) 109/52  Pulse: (!) 101 100 97 84  Resp: 16 17 18 13   Temp: 98.3 F (36.8 C)   97.9 F (36.6 C)  TempSrc: Oral   Oral  SpO2: 97% 98% 97% 98%  Weight:      Height:        Intake/Output Summary (Last 24 hours) at 11/24/2019 0830 Last data filed at 11/23/2019 2200 Gross per 24 hour  Intake --  Output 275 ml  Net -275 ml   Filed Weights   11/23/19 1328  Weight: 68 kg    Examination:  General exam: awake, alert, no acute distress HEENT: moist mucus membranes, significant hearing impairment right worse than left, have to speak loudly directly in the left ear Respiratory system: Diminished but clear to auscultation bilaterally, no wheezes, rales or rhonchi, normal respiratory effort. Cardiovascular system: normal S1/S2, RRR, no JVD, murmurs, rubs, gallops, no pedal edema.   Gastrointestinal system: soft, non-tender except for erythematous area adjacent to urostomy bag, colostomy present anterior midline with stool in bag, non-distended. Genitourinary system: Bilateral nephrostomy tubes present.  Urostomy bag present anteriorly with urine leakage.  Urine appears purulent Central nervous system: alert and oriented x3.  no gross focal neurologic deficits, normal speech Extremities: moves all, no edema, normal tone Skin: Area of induration and erythema adjacent to urostomy bag, unable to appreciate any fluctuance, minimal  purulent drainage versus leaking urine from urostomy bag Psychiatry: normal mood, congruent affect, judgement and insight appear normal    Data Reviewed: I have personally reviewed following labs and imaging studies  CBC: Recent Labs  Lab 11/23/19 1337 11/24/19 0551  WBC 28.6* 27.4*  NEUTROABS 24.3*  --   HGB 12.2* 9.8*  HCT 39.0 31.2*  MCV 93.3 94.8  PLT 469* 161   Basic Metabolic Panel: Recent Labs  Lab 11/23/19 1337 11/24/19 0551  NA 136 139  K 3.7 3.5  CL 98 105  CO2 28 27  GLUCOSE 143* 115*  BUN 22 20  CREATININE 1.01 1.00  CALCIUM 9.4 8.8*   GFR: Estimated Creatinine Clearance: 54.8 mL/min (by C-G formula based on SCr of 1 mg/dL). Liver Function Tests: Recent Labs  Lab 11/23/19 1337  AST 15  ALT 12  ALKPHOS 88  BILITOT 0.5  PROT 7.3  ALBUMIN 3.1*   No results for input(s): LIPASE, AMYLASE in the last 168 hours. No results for input(s): AMMONIA in the last 168 hours. Coagulation Profile: No results for input(s): INR, PROTIME in the last 168 hours. Cardiac Enzymes: No results for input(s): CKTOTAL, CKMB, CKMBINDEX, TROPONINI in the last 168 hours. BNP (last 3 results) No results for input(s): PROBNP in the last 8760 hours. HbA1C: No results for input(s): HGBA1C in the last 72 hours. CBG: No results for input(s): GLUCAP in the last 168 hours. Lipid Profile: No results for input(s): CHOL, HDL, LDLCALC, TRIG, CHOLHDL, LDLDIRECT in the last 72 hours. Thyroid Function Tests: No results for input(s): TSH, T4TOTAL, FREET4, T3FREE, THYROIDAB in the last 72 hours. Anemia Panel: No results for input(s): VITAMINB12, FOLATE, FERRITIN, TIBC, IRON, RETICCTPCT in the last 72 hours. Sepsis Labs: Recent Labs  Lab 11/23/19 1337 11/23/19 1712  LATICACIDVEN 2.3* 2.0*    Recent Results (from the past 240 hour(s))  Culture, blood (routine x 2)     Status: None (Preliminary result)   Collection Time: 11/23/19  5:12 PM   Specimen: BLOOD  Result Value Ref Range  Status   Specimen Description BLOOD LEFT ANTECUBITAL  Final   Special Requests   Final    BOTTLES DRAWN AEROBIC AND ANAEROBIC Blood Culture results may not be optimal due to an excessive volume of blood received in culture bottles   Culture   Final    NO GROWTH < 24 HOURS Performed at [facility], [address], [place], [place] [address]    Report Status PENDING  Incomplete  Culture, blood (routine x 2)     Status: None (Preliminary result)   Collection Time: 11/23/19  5:12 PM   Specimen: BLOOD  Result Value Ref Range Status   Specimen Description BLOOD BLOOD LEFT HAND  Final   Special Requests   Final    BOTTLES DRAWN AEROBIC AND ANAEROBIC Blood Culture adequate volume   Culture   Final    NO GROWTH < 24 HOURS Performed at [facility], [address] [place], [place] [address]    Report Status PENDING  Incomplete         Radiology Studies: CT ABDOMEN PELVIS W CONTRAST  Result Date: 11/23/2019 CLINICAL DATA:  Concern for abscess or infection on adjacent the inferior stoma site EXAM: CT ABDOMEN AND PELVIS WITH CONTRAST TECHNIQUE: Multidetector CT imaging of the  abdomen and pelvis was performed using the standard protocol following bolus administration of intravenous contrast. CONTRAST:  166mL OMNIPAQUE IOHEXOL 300 MG/ML  SOLN COMPARISON:  Radiograph 10/13/2019 FINDINGS: Lower chest: Bilateral effusions, small on the right and trace on the left, similar to slightly decreased in size from comparison study. Adjacent passive atelectasis with more bandlike areas of scarring and/or subsegmental atelectatic changes well. Normal heart size. No pericardial effusion. Few coronary artery calcifications are noted. Hepatobiliary: No focal liver abnormality is seen. Patient is post cholecystectomy. Slight prominence of the biliary tree likely related to reservoir effect. No calcified intraductal gallstones. Pancreas: Unremarkable. No pancreatic ductal dilatation or surrounding  inflammatory changes. Spleen: Normal in size without focal abnormality. Adrenals/Urinary Tract: Normal adrenal glands bilateral percutaneous nephrostomy tubes are in place. Pigtail catheter tips terminate appropriately within the proximal collecting systems. Some mild urothelial thickening is noted in both renal pelves, nonspecific. Redemonstration of the large complex centrally necrotic, enhancing mass lesion arising from the region of the bladder and extending directly through the anterior abdominal wall. There is diminished intraluminal gas but with increasing cystic components of this lesion which measures approximately 12.2 x 10.4 x 13 cm in size. Visible necked connection with the urinary bladder is noted posteriorly (2/71) with extension to the anterior midline urostomy (2/66). Surrounding phlegmonous changes noted with adjacent reactive free fluid as well. Stomach/Bowel: Distal esophagus, stomach and duodenal sweep are unremarkable. No small bowel wall thickening or dilatation. No evidence of obstruction. A normal appendix is visualized. Diverting loop colostomy noted in the mid abdomen (2/26) distal colon demonstrates some scattered colonic diverticula without focal pericolonic inflammation the suggest diverticulitis. More diffuse segmental thickening is noted in the sigmoid colon as o'clock passes in close proximity to the heterogeneous bladder mass detailed above, the significance of which is indeterminate. Inspissated stool ball is noted within the rectum. Vascular/Lymphatic: Atherosclerotic plaque within the normal caliber aorta. Prominent retroperitoneal nodes are present though no grossly enlarged abdominopelvic adenopathy is seen. Reproductive: Postsurgical changes noted of the mildly enlarged prostate. Keyhole defect within the prostate could reflect sequela of prior TURP. Direct involvement of the prostate with the large, heterogeneous bladder mass. Other: Reactive free fluid layering in the deep  pelvis, similar in extent to comparison. 4 Musculoskeletal: The osseous structures appear diffusely demineralized which may limit detection of small or nondisplaced fractures. Multilevel degenerative changes are present in the imaged portions of the spine. Bony fusion of L1-2 L3. 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 Mild canal stenosis and moderate bilateral neural foraminal narrowing at L5-S1. Severe degenerative changes noted in both hips. More moderate degenerative change noted in the SI joints and pubic symphysis. Prior right femoral intramedullary nail and transcervical fixation. IMPRESSION: 1. Redemonstration of the large complex centrally necrotic, enhancing mass arising from the region of the bladder and extending directly through the anterior abdominal wall. There is diminished intraluminal gas but with increasing cystic components of this lesion which measures approximately 12.2 x 10.4 x 13 cm in size. Visible connection with the urinary bladder is noted posteriorly with extension to the anterior midline urostomy. Surrounding phlegmonous changes noted with adjacent reactive free fluid as well. 2. Bilateral percutaneous nephrostomy tubes are in place with mild urothelial thickening in both renal pelves, nonspecific and possibly reactive due to the presence of the stents versus secondary to urinary tract infection. 3. Bilateral pleural effusions, small on the right and trace on the left, similar to slightly decreased in size from comparison study. 4. Distal colonic thickening  within the sigmoid in close proximity to the bladder mass, as detailed above. Significance of which is indeterminate. Diverting loop colostomy in place. 5. Inspissated stool ball within the rectum may represent fecal impaction. Electronically Signed   By: Lovena Le M.D.   On: 11/23/2019 18:37   IR NEPHROSTOMY EXCHANGE LEFT  Result Date: 11/22/2019 INDICATION: History of bladder cancer with chronic  bilateral percutaneous nephrostomy catheters. Patient presents today for routine fluoroscopic guided exchange. EXAM: FLUOROSCOPIC GUIDED BILATERAL SIDED NEPHROSTOMY CATHETER EXCHANGE COMPARISON:  Multiple previous fluoroscopic guided nephrostomy catheter exchanges, most recently on 09/13/2019; CT abdomen pelvis-10/13/2019 CONTRAST:  A total of 20 mL Isovue-300 administered was administered into both collecting systems FLUOROSCOPY TIME:  1 minute, 24 seconds (23 mGy) COMPLICATIONS: None immediate. TECHNIQUE: Informed written consent was obtained from the patient after a discussion of the risks, benefits and alternatives to treatment. Questions regarding the procedure were encouraged and answered. A timeout was performed prior to the initiation of the procedure. The bilateral flanks and external portions of existing nephrostomy catheters were prepped and draped in the usual sterile fashion. A sterile drape was applied covering the operative field. Maximum barrier sterile technique with sterile gowns and gloves were used for the procedure. A timeout was performed prior to the initiation of the procedure. A pre procedural spot fluoroscopic image was obtained. Beginning with the left-sided nephrostomy, a small amount of contrast was injected via the existing left-sided nephrostomy catheter demonstrating appropriate positioning within the renal pelvis. The existing nephrostomy catheter was cut and cannulated with a Benson wire which was coiled within the renal pelvis. Under intermittent fluoroscopic guidance, the existing nephrostomy catheter was exchanged for a new 12 [place] all-purpose drainage catheter. Limited contrast injection confirmed appropriate positioning within the left renal pelvis and a post exchange fluoroscopic image was obtained. The catheter was locked, secured to the skin with an interrupted suture and reconnected to a gravity bag. The identical repeat procedure was repeated for the contralateral  right-sided nephrostomy, ultimately allowing successful exchange of a new 10.2 [place] all-purpose drainage catheter with end coiled and locked within the right renal pelvis. Dressings were placed. The patient tolerated the above procedures well without immediate postprocedural complication. FINDINGS: The existing nephrostomy catheters are appropriately positioned and functioning. After successful fluoroscopic guided exchange, new bilateral 12 French nephrostomy catheters are coiled and locked within the respective renal pelvises. IMPRESSION: Successful fluoroscopic guided exchange of bilateral 12 French percutaneous nephrostomy catheters. Electronically Signed   By: Sandi Mariscal M.D.   On: 11/22/2019 10:54   IR NEPHROSTOMY EXCHANGE RIGHT  Result Date: 11/22/2019 INDICATION: History of bladder cancer with chronic bilateral percutaneous nephrostomy catheters. Patient presents today for routine fluoroscopic guided exchange. EXAM: FLUOROSCOPIC GUIDED BILATERAL SIDED NEPHROSTOMY CATHETER EXCHANGE COMPARISON:  Multiple previous fluoroscopic guided nephrostomy catheter exchanges, most recently on 09/13/2019; CT abdomen pelvis-10/13/2019 CONTRAST:  A total of 20 mL Isovue-300 administered was administered into both collecting systems FLUOROSCOPY TIME:  1 minute, 24 seconds (23 mGy) COMPLICATIONS: None immediate. TECHNIQUE: Informed written consent was obtained from the patient after a discussion of the risks, benefits and alternatives to treatment. Questions regarding the procedure were encouraged and answered. A timeout was performed prior to the initiation of the procedure. The bilateral flanks and external portions of existing nephrostomy catheters were prepped and draped in the usual sterile fashion. A sterile drape was applied covering the operative field. Maximum barrier sterile technique with sterile gowns and gloves were used for the procedure. A timeout was performed prior to the  initiation of the procedure. A  pre procedural spot fluoroscopic image was obtained. Beginning with the left-sided nephrostomy, a small amount of contrast was injected via the existing left-sided nephrostomy catheter demonstrating appropriate positioning within the renal pelvis. The existing nephrostomy catheter was cut and cannulated with a Benson wire which was coiled within the renal pelvis. Under intermittent fluoroscopic guidance, the existing nephrostomy catheter was exchanged for a new 12 [place] all-purpose drainage catheter. Limited contrast injection confirmed appropriate positioning within the left renal pelvis and a post exchange fluoroscopic image was obtained. The catheter was locked, secured to the skin with an interrupted suture and reconnected to a gravity bag. The identical repeat procedure was repeated for the contralateral right-sided nephrostomy, ultimately allowing successful exchange of a new 10.2 [place] all-purpose drainage catheter with end coiled and locked within the right renal pelvis. Dressings were placed. The patient tolerated the above procedures well without immediate postprocedural complication. FINDINGS: The existing nephrostomy catheters are appropriately positioned and functioning. After successful fluoroscopic guided exchange, new bilateral 12 French nephrostomy catheters are coiled and locked within the respective renal pelvises. IMPRESSION: Successful fluoroscopic guided exchange of bilateral 12 French percutaneous nephrostomy catheters. Electronically Signed   By: Sandi Mariscal M.D.   On: 11/22/2019 10:54        Scheduled Meds: . enoxaparin (LOVENOX) injection  40 mg Subcutaneous Q24H  . feeding supplement (ENSURE ENLIVE)  237 mL Oral BID BM  . ferrous sulfate  325 mg Oral BID WC  . loratadine  10 mg Oral Daily  . mirabegron ER  50 mg Oral Daily  . multivitamin with minerals  1 tablet Oral Daily  . vitamin B-12  500 mcg Oral Daily   Continuous Infusions: . sodium chloride Stopped (11/24/19  0511)  . piperacillin-tazobactam (ZOSYN)  IV Stopped (11/24/19 0421)  . vancomycin       LOS: 1 day    Time spent: 40 minutes    Ezekiel Slocumb, DO [facility]   If 7PM-7AM, please contact night-coverage [email or web address] Password [email or web address] 11/24/2019, 8:30 AM

## 2019-11-24 NOTE — Progress Notes (Signed)
Pharmacy Antibiotic Note  Vincent Poole is a 85 y.o. male admitted on 11/23/2019 with cellulitis.  Pharmacy has been consulted for vancomycin dosing.   Plan: Patient received vanc 1.75g IV load in ED  Vancomycin 1500 mg IV Q 24 hrs. Goal AUC 400-550. Expected AUC: 549.2 SCr used: 1.01 Cssmin: 11.8  Patient is also on zosyn 3.375g IV q8h, will continue to monitor renal fx and adjust as needed. This combo may cause AKI will monitor renal fx closely.  Height: 6' (182.9 cm) Weight: 150 lb (68 kg) IBW/kg (Calculated) : 77.6  Temp (24hrs), Avg:98.2 F (36.8 C), Min:98 F (36.7 C), Max:98.3 F (36.8 C)  Recent Labs  Lab 11/23/19 1337 11/23/19 1712  WBC 28.6*  --   CREATININE 1.01  --   LATICACIDVEN 2.3* 2.0*    Estimated Creatinine Clearance: 54.2 mL/min (by C-G formula based on SCr of 1.01 mg/dL).    No Known Allergies   Thank you for allowing pharmacy to be a part of this patient's care.  Tobie Lords, PharmD, BCPS Clinical Pharmacist 11/24/2019 2:14 AM

## 2019-11-24 NOTE — ED Notes (Signed)
Pt provided lunch tray. Stated he didn't want to eat anything. Encouraged to take a few bites and drink some ensure.

## 2019-11-24 NOTE — ED Notes (Signed)
Pt moved onto hospital bed. Informed to let staff know when he needs colostomy bag changed. Pt verbalized understanding.

## 2019-11-24 NOTE — Consult Note (Signed)
SURGICAL CONSULTATION NOTE   HISTORY OF PRESENT ILLNESS (HPI):  85 y.o. male presented to [facility] ED for evaluation of pain in the anterior abdominal wall since few weeks ago.  Patient has history of bladder cancer on multiple oncologic treatment without response.  Patient developed large intestinal obstruction requiring transverse diverting colostomy.  Patient also developed bilateral ureteral obstruction requiring bilateral percutaneous nephrotomies.  After the episode of large intestinal obstruction patient had another course of oncologic treatment without response.  Since then the mass of the bladder continue to increase and continue to invade anteriorly to the abdominal wall.  He was admitted recently due to cellulitis of the abdominal wall and was treated with antibiotic therapy with adequate response.  He was sent home with positive care and he comes back with worsening abdominal wall pain.  At the ED he had another CT scan done showing a large bladder mass invading the anterior abdominal wall.  No other intra-abdominal complication from bladder mass.  There is no sign of intestinal obstruction.  Colostomy working properly.  White blood cells shows 28,000.  This has been the lowest blood cell count the patient has had in the last 3 months.  At the moment of my evaluation the pain with significant pain on the outside abdominal wall.  There is no pain radiation.  There is no alleviating or aggravating factor.  Surgery is consulted by Dr. Arbutus Ped in this context for evaluation and management of cellulitis of the abdominal wall.  PAST MEDICAL HISTORY (PMH):  Past Medical History:  Diagnosis Date  . Anxiety   . Arthritis   . Bladder cancer ([place])   . Dysrhythmia 07/2018   history of atrial flutter that worsens with anxiety  . Femur fracture, right ([place]) 05/01/2018  . GERD (gastroesophageal reflux disease)   . History of recent blood transfusion 05/2018  . Hypertension   . Iron deficiency  anemia 09/14/2018  . Prostate cancer ([place]) 07/2018   cancer growing in prostate but not prostate cancer, it is from the bladder  . Umbilical hernia 63/1497  . Urinary retention 2019   foley catheter place 11/2017  . UTI (urinary tract infection) 2019   frequent UTI's over last year  . Wound eschar of foot 07/2018   left heal getting wrapped and requiring antibiotic cream. cracks open with weight bearing.     PAST SURGICAL HISTORY ([place]):  Past Surgical History:  Procedure Laterality Date  . CARPAL TUNNEL RELEASE Right   . CHOLECYSTECTOMY  2004  . COLOSTOMY N/A 06/08/2019   Procedure: COLOSTOMY;  Surgeon: Herbert Pun, MD;  Location: [facility]S;  Service: General;  Laterality: N/A;  . CYSTOGRAM  07/18/2018   Procedure: CYSTOGRAM;  Surgeon: [name], MD;  Location: [facility];  Service: Urology;;  . Consuela Mimes W/ RETROGRADES Bilateral 07/18/2018   Procedure: CYSTOSCOPY WITH RETROGRADE PYELOGRAM;  Surgeon: [name], MD;  Location: [facility];  Service: Urology;  Laterality: Bilateral;  . CYSTOSCOPY W/ URETERAL STENT PLACEMENT Bilateral 12/27/2017   Procedure: CYSTOSCOPY WITH RETROGRADE PYELOGRAM/URETERAL STENT PLACEMENT;  Surgeon: [name], MD;  Location: [facility];  Service: Urology;  Laterality: Bilateral;  . CYSTOSCOPY W/ URETERAL STENT PLACEMENT Bilateral 07/18/2018   Procedure: CYSTOSCOPY WITH STENT REPLACEMENT (exchange);  Surgeon: [name], MD;  Location: [facility];  Service: Urology;  Laterality: Bilateral;  . CYSTOSCOPY WITH STENT PLACEMENT Bilateral 11/12/2018   Procedure: [place] WITH STENT Exchange;  Surgeon: [name], MD;  Location: [facility];  Service: Urology;  Laterality: Bilateral;  .  INTRAMEDULLARY (IM) NAIL INTERTROCHANTERIC Right 05/02/2018   Procedure: INTRAMEDULLARY (IM) NAIL INTERTROCHANTRIC;  Surgeon: [name], MD;  Location: [facility];  Service: Orthopedics;  Laterality: Right;  . IR NEPHROSTOMY EXCHANGE LEFT  03/25/2019  . IR  NEPHROSTOMY EXCHANGE LEFT  04/19/2019  . IR NEPHROSTOMY EXCHANGE LEFT  05/31/2019  . IR NEPHROSTOMY EXCHANGE LEFT  07/19/2019  . IR NEPHROSTOMY EXCHANGE LEFT  09/13/2019  . IR NEPHROSTOMY EXCHANGE LEFT  11/22/2019  . IR NEPHROSTOMY EXCHANGE RIGHT  03/25/2019  . IR NEPHROSTOMY EXCHANGE RIGHT  04/19/2019  . IR NEPHROSTOMY EXCHANGE RIGHT  05/31/2019  . IR NEPHROSTOMY EXCHANGE RIGHT  07/19/2019  . IR NEPHROSTOMY EXCHANGE RIGHT  09/13/2019  . IR NEPHROSTOMY EXCHANGE RIGHT  11/22/2019  . IR NEPHROSTOMY PLACEMENT LEFT  02/23/2019  . IR NEPHROSTOMY PLACEMENT RIGHT  02/23/2019  . LAPAROTOMY N/A 06/08/2019   Procedure: EXPLORATORY LAPAROTOMY;  Surgeon: Herbert Pun, MD;  Location: [facility];  Service: General;  Laterality: N/A;  . LEG TENDON SURGERY Right 1958  . PORTA CATH INSERTION N/A 01/22/2018   Procedure: PORTA CATH INSERTION;  Surgeon: [name], MD;  Location: [facility];  Service: Cardiovascular;  Laterality: N/A;  . TRANSURETHRAL RESECTION OF BLADDER TUMOR N/A 12/27/2017   Procedure: TRANSURETHRAL RESECTION OF BLADDER TUMOR (TURBT);  Surgeon: [name], MD;  Location: [facility];  Service: Urology;  Laterality: N/A;  . TRANSURETHRAL RESECTION OF BLADDER TUMOR N/A 01/15/2018   Procedure: TRANSURETHRAL RESECTION OF BLADDER TUMOR (TURBT);  Surgeon: [name], MD;  Location: [facility];  Service: Urology;  Laterality: N/A;  Need 2 hrs for this case please  . TRANSURETHRAL RESECTION OF BLADDER TUMOR N/A 07/18/2018   Procedure: TRANSURETHRAL RESECTION OF BLADDER TUMOR (TURBT);  Surgeon: [name], MD;  Location: [facility];  Service: Urology;  Laterality: N/A;     MEDICATIONS:  Prior to Admission medications   Medication Sig Start Date End Date Taking? Authorizing Provider  cetirizine (ZYRTEC) 10 MG tablet Take 10 mg by mouth daily as needed for allergies.    Yes [provider]  ciprofloxacin (CIPRO) 500 MG tablet Take 1 tablet (500 mg total) by mouth 2 (two) times  daily for 3 days. Begin medication on 11/21/2019 11/21/19 11/24/19 Yes [name], MD  Cranberry-Cholecalciferol (SUPER CRANBERRY/VITAMIN D3) 4200-500 MG-UNIT CAPS Take 1 capsule by mouth 2 (two) times daily.    Yes [provider]  ferrous sulfate 325 (65 FE) MG tablet Take 1 tablet (325 mg total) by mouth 2 (two) times daily with a meal. 05/06/18  Yes Gouru, Aruna, MD  Multiple Vitamin (MULTIVITAMIN WITH MINERALS) TABS tablet Take 1 tablet by mouth daily.   Yes [provider]  MYRBETRIQ 50 MG TB24 tablet TAKE 1 TABLET BY MOUTH DAILY Patient taking differently: Take 50 mg by mouth daily.  04/30/19  Yes McGowan, Larene Beach A, PA-C  vitamin B-12 (CYANOCOBALAMIN) 500 MCG tablet Take 500 mcg by mouth daily.   Yes [provider]  acetaminophen (TYLENOL) 500 MG tablet Take 1,000 mg by mouth every 6 (six) hours as needed for moderate pain or headache.     [provider]  dexamethasone (DECADRON) 4 MG tablet Take 2 tablets (8 mg total) by mouth daily. Start the day after chemotherapy for 2 days. 08/01/19   Sindy Guadeloupe, MD  feeding supplement, ENSURE ENLIVE, (ENSURE ENLIVE) LIQD Take 237 mLs by mouth 2 (two) times daily between meals. 06/11/19   Bettey Costa, MD  Glycerin-Hypromellose-PEG 400 (DRY EYE RELIEF DROPS  OP) Apply 1 drop to eye daily as needed.     [provider]  lidocaine-prilocaine (EMLA) cream Apply 1 application topically as needed. 01/02/19   Sindy Guadeloupe, MD  nystatin [facility]) powder Apply topically 2 (two) times daily. Patient taking differently: Apply 1 g topically 2 (two) times daily as needed.  09/28/18   Zara Council A, PA-C  ondansetron (ZOFRAN) 8 MG tablet Take 1 tablet (8 mg total) by mouth 2 (two) times daily as needed for refractory nausea / vomiting. Start on day 3 after chemo. Patient not taking: Reported on 11/11/2019 08/01/19   Sindy Guadeloupe, MD  oxyCODONE (OXY IR/ROXICODONE) 5 MG immediate release tablet TAKE 1 TABLET BY MOUTH  EVERY 4 HOURS AS NEEDED FOR SEVERE PAIN Patient taking differently: Take 5 mg by mouth every 4 (four) hours as needed for severe pain.  11/05/19   Sindy Guadeloupe, MD  polyethylene glycol powder (MIRALAX) powder Take 17 g by mouth daily as needed. Can increase to 3 times a day as needed for constipation but hold medication if has diarrhea 01/30/18   Sindy Guadeloupe, MD  prochlorperazine (COMPAZINE) 10 MG tablet Take 1 tablet (10 mg total) by mouth every 6 (six) hours as needed (Nausea or vomiting). Patient not taking: Reported on 11/11/2019 08/01/19   Sindy Guadeloupe, MD  zinc oxide 20 % ointment Apply 1 application topically as needed for irritation.    [provider]     ALLERGIES:  No Known Allergies   SOCIAL HISTORY:  Social History   Socioeconomic History  . Marital status: Married    Spouse name: ruth  . Number of children: Not on file  . Years of education: Not on file  . Highest education level: Not on file  Occupational History  . Occupation: retired    Comment: Therapist, [employer]  Tobacco Use  . Smoking status: Never Smoker  . Smokeless tobacco: Never Used  Substance and Sexual Activity  . Alcohol use: No    Alcohol/week: 0.0 standard drinks  . Drug use: No  . Sexual activity: Not Currently  Other Topics Concern  . Not on file  Social History Narrative  . Not on file   Social Determinants of Health   Financial Resource Strain: Unknown  . Difficulty of Paying Living Expenses: Patient refused  Food Insecurity: Unknown  . Worried About Charity fundraiser in the Last Year: Patient refused  . Ran Out of Food in the Last Year: Patient refused  Transportation Needs: Unknown  . Lack of Transportation (Medical): Patient refused  . Lack of Transportation (Non-Medical): Patient refused  Physical Activity: Unknown  . Days of Exercise per Week: Patient refused  . Minutes of Exercise per Session: Patient refused  Stress: Unknown  . Feeling of Stress : Patient refused    Social Connections: Unknown  . Frequency of Communication with Friends and Family: Patient refused  . Frequency of Social Gatherings with Friends and Family: Patient refused  . Attends Religious Services: Patient refused  . Active Member of Clubs or Organizations: Patient refused  . Attends Archivist Meetings: Patient refused  . Marital Status: Patient refused  Intimate Partner Violence: Unknown  . Fear of Current or Ex-Partner: Patient refused  . Emotionally Abused: Patient refused  . Physically Abused: Patient refused  . Sexually Abused: Patient refused    The patient currently resides (home / rehab facility / nursing home): Granddaughters Home   FAMILY HISTORY:  Family History  Problem Relation Age of Onset  . Cancer Mother   . Chronic Renal Failure Mother   . Heart disease Father      REVIEW OF SYSTEMS:  Constitutional: denies weight loss, fever, chills, or sweats  Eyes: denies any other vision changes, history of eye injury  ENT: denies sore throat, positive for hearing problems  Respiratory: denies shortness of breath, wheezing  Cardiovascular: denies chest pain, palpitations  Gastrointestinal: Positive abdominal pain, negative for nausea or vomiting  Genitourinary: denies burning with urination or urinary frequency.  Patient with nephrostomies Musculoskeletal: Positive for  joint pains or cramps  Skin: Positive for abdominal wall skin pain Neurological: denies any other headache, dizziness, weakness  Psychiatric: denies any other depression, anxiety   All other review of systems were negative   VITAL SIGNS:  Temp:  [97.9 F (36.6 C)-98.3 F (36.8 C)] 97.9 F (36.6 C) (01/17 0655) Pulse Rate:  [84-107] 86 (01/17 1300) Resp:  [13-18] 17 (01/17 1300) BP: (100-131)/(52-63) 114/58 (01/17 1300) SpO2:  [97 %-99 %] 98 % (01/17 1300)     Height: 6' (182.9 cm) Weight: 68 kg BMI (Calculated): 20.34   INTAKE/OUTPUT:  This shift: Total I/O In: 92.6 [IV  Piggyback:92.6] Out: -   Last 2 shifts: @IOLAST2SHIFTS @   PHYSICAL EXAM:  Constitutional:  -- Normal body habitus  -- Awake, alert, and oriented x3  Eyes:  -- Pupils equally round and reactive to light  -- No scleral icterus  Ear, nose, and throat:  -- No jugular venous distension  Pulmonary:  -- No crackles  -- Equal breath sounds bilaterally -- Breathing non-labored at rest Cardiovascular:  -- S1, S2 present  -- No pericardial rubs Gastrointestinal:  -- Abdomen soft, tender to palpation in lower abdomen due to skin irritation, non-distended, no guarding or rebound tenderness --Palpable mass in the lower abdomen, suprapubic. --Skin about the palpable mass very irritated and tender Musculoskeletal and Integumentary:  -- Wounds or skin discoloration: None appreciated -- Extremities: B/L UE and LE FROM, hands and feet warm, no edema  Neurologic:  -- Motor function: intact and symmetric -- Sensation: intact and symmetric      Physical exam is consistent with significant finding of an mass invading the abdominal wall with a small opening that is draining the necrotic mass.  Labs:  CBC Latest Ref Rng & Units 11/24/2019 11/23/2019 11/11/2019  WBC 4.0 - 10.5 K/uL 27.4(H) 28.6(H) 34.6(H)  Hemoglobin 13.0 - 17.0 g/dL 9.8(L) 12.2(L) 11.5(L)  Hematocrit 39.0 - 52.0 % 31.2(L) 39.0 36.9(L)  Platelets 150 - 400 K/uL 330 469(H) 378   CMP Latest Ref Rng & Units 11/24/2019 11/23/2019 11/11/2019  Glucose 70 - 99 mg/dL 115(H) 143(H) 125(H)  BUN 8 - 23 mg/dL 20 22 24(H)  Creatinine 0.61 - 1.24 mg/dL 1.00 1.01 0.92  Sodium 135 - 145 mmol/L 139 136 136  Potassium 3.5 - 5.1 mmol/L 3.5 3.7 3.9  Chloride 98 - 111 mmol/L 105 98 101  CO2 22 - 32 mmol/L 27 28 24   Calcium 8.9 - 10.3 mg/dL 8.8(L) 9.4 9.0  Total Protein 6.5 - 8.1 g/dL - 7.3 6.8  Total Bilirubin 0.3 - 1.2 mg/dL - 0.5 0.3  Alkaline Phos 38 - 126 U/L - 88 92  AST 15 - 41 U/L - 15 18  ALT 0 - 44 U/L - 12 14    Imaging studies:  I  personally evaluated the CT scan showing the large bladder mass invading aggressively the abdominal wall.  No free air.  No bowel distention.  No drainable intra-abdominal abscess.  Assessment/Plan:  85 y.o. male with bladder cancer invading the anterior abdominal wall, complicated by pertinent comorbidities including bladder cancer with metastases that has not responded to immunotherapy, hypertension, hydronephrosis, severe weakness.  I was consulted with concern of abdominal cellulitis.  My assessment of this abdominal wall pathology is that this is direct invasion of the bladder cancer that is erupting his way out of the skin at the area shown on the picture above.  The pain that this patient is experiencing is coming from irritation from the output of this necrotic mass.  The current back that they are using to collect the output is leaking so the content is irritating the patient's skin.  This is a very difficult situation because and I think that there is any adequate appliances to adequately collect the output without irritating the skin.  I do not recommend any invasive procedure nor debridement since what is below the skin is a necrotic cancer and nothing that will be done there will heal and will discussed at bigger and worse malignant ulcer causing more pain and found to the patient.  The white blood cell count of this patient is not reliable assessment of infection since today 27,000 white blood cells have been the least that he has had in the last 3 months.  Patient has been seen by palliative care that were following at home.  At this moment what I recommend is to try to figure out the best local care to decrease the pain of this patient abdominal wall in combination with pain management and comfort measures.  I do not recommend any surgical intervention at this moment.  Arnold Long, MD

## 2019-11-24 NOTE — Progress Notes (Signed)
Spoke with patient's wife with his permission to ask admission questions. Wife states Vincent Poole does not have hearing aids and finds it helpful to communicate via whiteboard when appropriate.

## 2019-11-24 NOTE — ED Notes (Signed)
Called dietary to get Ensure sent to ER

## 2019-11-24 NOTE — ED Notes (Signed)
Pt lying in bed with no complaints for this RN at this time. Will continue to monitor

## 2019-11-25 ENCOUNTER — Encounter: Payer: Self-pay | Admitting: Family Medicine

## 2019-11-25 DIAGNOSIS — L03311 Cellulitis of abdominal wall: Secondary | ICD-10-CM

## 2019-11-25 DIAGNOSIS — R7881 Bacteremia: Secondary | ICD-10-CM

## 2019-11-25 DIAGNOSIS — E46 Unspecified protein-calorie malnutrition: Secondary | ICD-10-CM

## 2019-11-25 DIAGNOSIS — D649 Anemia, unspecified: Secondary | ICD-10-CM

## 2019-11-25 DIAGNOSIS — D65 Disseminated intravascular coagulation [defibrination syndrome]: Secondary | ICD-10-CM

## 2019-11-25 DIAGNOSIS — R6520 Severe sepsis without septic shock: Secondary | ICD-10-CM

## 2019-11-25 DIAGNOSIS — Z8781 Personal history of (healed) traumatic fracture: Secondary | ICD-10-CM

## 2019-11-25 DIAGNOSIS — Z933 Colostomy status: Secondary | ICD-10-CM

## 2019-11-25 DIAGNOSIS — Z936 Other artificial openings of urinary tract status: Secondary | ICD-10-CM

## 2019-11-25 DIAGNOSIS — C679 Malignant neoplasm of bladder, unspecified: Secondary | ICD-10-CM

## 2019-11-25 DIAGNOSIS — N1330 Unspecified hydronephrosis: Secondary | ICD-10-CM

## 2019-11-25 DIAGNOSIS — Z960 Presence of urogenital implants: Secondary | ICD-10-CM

## 2019-11-25 DIAGNOSIS — A4101 Sepsis due to Methicillin susceptible Staphylococcus aureus: Secondary | ICD-10-CM

## 2019-11-25 DIAGNOSIS — C792 Secondary malignant neoplasm of skin: Secondary | ICD-10-CM

## 2019-11-25 LAB — CULTURE, BLOOD (ROUTINE X 2): Special Requests: ADEQUATE

## 2019-11-25 LAB — COMPREHENSIVE METABOLIC PANEL
ALT: 9 U/L (ref 0–44)
AST: 12 U/L — ABNORMAL LOW (ref 15–41)
Albumin: 2.2 g/dL — ABNORMAL LOW (ref 3.5–5.0)
Alkaline Phosphatase: 76 U/L (ref 38–126)
Anion gap: 10 (ref 5–15)
BUN: 19 mg/dL (ref 8–23)
CO2: 23 mmol/L (ref 22–32)
Calcium: 8.6 mg/dL — ABNORMAL LOW (ref 8.9–10.3)
Chloride: 107 mmol/L (ref 98–111)
Creatinine, Ser: 1.08 mg/dL (ref 0.61–1.24)
GFR calc Af Amer: >60 mL/min (ref >60)
GFR calc non Af Amer: >60 mL/min (ref >60)
Glucose, Bld: 89 mg/dL (ref 70–99)
Potassium: 3.3 mmol/L — ABNORMAL LOW (ref 3.5–5.1)
Sodium: 140 mmol/L (ref 135–145)
Total Bilirubin: 0.7 mg/dL (ref 0.3–1.2)
Total Protein: 5.6 g/dL — ABNORMAL LOW (ref 6.5–8.1)

## 2019-11-25 LAB — CBC WITH DIFFERENTIAL/PLATELET
Abs Immature Granulocytes: 0.29 10*3/uL — ABNORMAL HIGH (ref 0.00–0.07)
Basophils Absolute: 0.1 10*3/uL (ref 0.0–0.1)
Basophils Relative: 1 %
Eosinophils Absolute: 0.4 10*3/uL (ref 0.0–0.5)
Eosinophils Relative: 1 %
HCT: 31.5 % — ABNORMAL LOW (ref 39.0–52.0)
Hemoglobin: 9.7 g/dL — ABNORMAL LOW (ref 13.0–17.0)
Immature Granulocytes: 1 %
Lymphocytes Relative: 6 %
Lymphs Abs: 1.7 10*3/uL (ref 0.7–4.0)
MCH: 29.4 pg (ref 26.0–34.0)
MCHC: 30.8 g/dL (ref 30.0–36.0)
MCV: 95.5 fL (ref 80.0–100.0)
Monocytes Absolute: 1.3 10*3/uL — ABNORMAL HIGH (ref 0.1–1.0)
Monocytes Relative: 5 %
Neutro Abs: 24.6 10*3/uL — ABNORMAL HIGH (ref 1.7–7.7)
Neutrophils Relative %: 86 %
Platelets: 342 10*3/uL (ref 150–400)
RBC: 3.3 MIL/uL — ABNORMAL LOW (ref 4.22–5.81)
RDW: 16.6 % — ABNORMAL HIGH (ref 11.5–15.5)
Smear Review: NORMAL
WBC: 28.5 10*3/uL — ABNORMAL HIGH (ref 4.0–10.5)
nRBC: 0 % (ref 0.0–0.2)

## 2019-11-25 LAB — LACTIC ACID, PLASMA: Lactic Acid, Venous: 0.7 mmol/L (ref 0.5–1.9)

## 2019-11-25 LAB — MAGNESIUM: Magnesium: 1.7 mg/dL (ref 1.7–2.4)

## 2019-11-25 MED ORDER — POTASSIUM CHLORIDE CRYS ER 20 MEQ PO TBCR
40.0000 meq | EXTENDED_RELEASE_TABLET | ORAL | Status: AC
  Administered 2019-11-25 (×2): 40 meq via ORAL
  Filled 2019-11-25 (×2): qty 2

## 2019-11-25 NOTE — Consult Note (Signed)
Hematology/Oncology Consult note [facility] Telephone:([phone] [phone]  Patient Care Team: [name], MD as PCP - General (Family Medicine) Jason Coop, NP as Nurse Practitioner   Name of the patient: Vincent Poole  [id number]  1935-10-29    Reason for consult: Metastatic bladder cancer   Requesting physician: Dr. Nicole Kindred  Date of visit: 11/25/2019    History of presenting illness-patient is a 85 year old male with metastatic urothelial carcinoma who has progressed on 3 lines of treatment including gemcitabine carboplatin, Keytruda and more recently Padcev.  He has locally advanced bladder cancer that has caused malignant bowel obstruction s/p diverting colostomy.  He has bilateral hydronephrosis s/p bilateral nephrostomy tubes.  More recently his tumor has been fungating through the anterior abdominal wall for which she received palliative radiation treatment which has not helped him.  He is not a candidate for any further systemic chemotherapy and he last received treatment over 2 months ago.  He is currently on best supportive care and home palliative care is following him.  Patient's family has been resistant to home hospice in the past.  He has been admitted again with worsening of his anterior abdominal wall wound.  He has been seen by surgery and not deemed to be a surgical candidate.  Also seen by infectious disease and this is not consistent with infection that can be helped by antibiotics.  ECOG PS- 3  Pain scale- 4   Review of systems- Review of Systems  Constitutional: Positive for malaise/fatigue. Negative for chills, fever and weight loss.  HENT: Negative for congestion, ear discharge and nosebleeds.   Eyes: Negative for blurred vision.  Respiratory: Negative for cough, hemoptysis, sputum production, shortness of breath and wheezing.   Cardiovascular: Negative for chest pain, palpitations, orthopnea and  claudication.  Gastrointestinal: Positive for abdominal pain. Negative for blood in stool, constipation, diarrhea, heartburn, melena, nausea and vomiting.  Genitourinary: Negative for dysuria, flank pain, frequency, hematuria and urgency.  Musculoskeletal: Negative for back pain, joint pain and myalgias.  Skin: Negative for rash.  Neurological: Negative for dizziness, tingling, focal weakness, seizures, weakness and headaches.  Endo/Heme/Allergies: Does not bruise/bleed easily.  Psychiatric/Behavioral: Negative for depression and suicidal ideas. The patient does not have insomnia.     No Known Allergies  Patient Active Problem List   Diagnosis Date Noted  . Abdominal wall cellulitis 11/23/2019  . Intra-abdominal abscess ([place])   . Intra-abdominal infection 10/05/2019  . S/P partial resection of colon 07/17/2019  . Protein-calorie malnutrition, severe 06/11/2019  . Abdominal pain, generalized   . Palliative care by specialist   . Urothelial cancer ([place])   . Weakness   . Large bowel obstruction ([place]) 06/06/2019  . Hydronephrosis, right 04/08/2019  . Pressure injury of skin 03/23/2019  . SIRS (systemic inflammatory response syndrome) ([place]) 03/22/2019  . Leukocytosis 02/21/2019  . Attention to nephrostomy ([place]) 11/22/2018  . Complicated UTI (urinary tract infection) 11/14/2018  . Palliative care encounter   . Iron deficiency anemia secondary to inadequate dietary iron intake 06/27/2018  . Vitamin B12 deficiency 06/27/2018  . History of fracture of right hip 06/26/2018  . History of normocytic normochromic anemia 06/26/2018  . Personal history of bladder cancer 06/26/2018  . Hip fracture ([place]) 05/01/2018  . Malignant neoplasm of urinary bladder ([place]) 01/12/2018  . Goals of care, counseling/discussion 01/12/2018  . History of recurrent UTIs 08/03/2017  . Pyelonephritis 05/31/2017  . UTI (urinary tract infection) 03/04/2017  . Urinary obstruction   .  Essential hypertension  08/30/2016  . History of shingles 08/30/2016  . Prostate cancer ([place]) 08/30/2016  . Urinary retention 08/30/2016  . Medicare annual wellness visit, initial 08/01/2016  . Medicare annual wellness visit, subsequent 08/01/2016  . Sepsis ([place]) 07/11/2016  . Borderline diabetes mellitus 01/26/2016  . Vaccine counseling 01/26/2015  . Lumbar radiculitis 06/24/2014  . OA (osteoarthritis) of hip 06/24/2014  . Malignant neoplasm of prostate ([place]) 01/27/2011     Past Medical History:  Diagnosis Date  . Anxiety   . Arthritis   . Bladder cancer ([place])   . Dysrhythmia 07/2018   history of atrial flutter that worsens with anxiety  . Femur fracture, right ([place]) 05/01/2018  . GERD (gastroesophageal reflux disease)   . History of recent blood transfusion 05/2018  . Hypertension   . Iron deficiency anemia 09/14/2018  . Prostate cancer ([place]) 07/2018   cancer growing in prostate but not prostate cancer, it is from the bladder  . Umbilical hernia 79/3903  . Urinary retention 2019   foley catheter place 11/2017  . UTI (urinary tract infection) 2019   frequent UTI's over last year  . Wound eschar of foot 07/2018   left heal getting wrapped and requiring antibiotic cream. cracks open with weight bearing.     Past Surgical History:  Procedure Laterality Date  . CARPAL TUNNEL RELEASE Right   . CHOLECYSTECTOMY  2004  . COLOSTOMY N/A 06/08/2019   Procedure: COLOSTOMY;  Surgeon: Herbert Pun, MD;  Location: [facility];  Service: General;  Laterality: N/A;  . CYSTOGRAM  07/18/2018   Procedure: CYSTOGRAM;  Surgeon: [name], MD;  Location: [facility];  Service: Urology;;  . Consuela Mimes W/ RETROGRADES Bilateral 07/18/2018   Procedure: CYSTOSCOPY WITH RETROGRADE PYELOGRAM;  Surgeon: [name], MD;  Location: [facility];  Service: Urology;  Laterality: Bilateral;  . CYSTOSCOPY W/ URETERAL STENT PLACEMENT Bilateral 12/27/2017   Procedure: CYSTOSCOPY WITH RETROGRADE PYELOGRAM/URETERAL STENT  PLACEMENT;  Surgeon: [name], MD;  Location: [facility];  Service: Urology;  Laterality: Bilateral;  . CYSTOSCOPY W/ URETERAL STENT PLACEMENT Bilateral 07/18/2018   Procedure: CYSTOSCOPY WITH STENT REPLACEMENT (exchange);  Surgeon: [name], MD;  Location: [facility];  Service: Urology;  Laterality: Bilateral;  . CYSTOSCOPY WITH STENT PLACEMENT Bilateral 11/12/2018   Procedure: [place] WITH STENT Exchange;  Surgeon: [name], MD;  Location: [facility];  Service: Urology;  Laterality: Bilateral;  . INTRAMEDULLARY (IM) NAIL INTERTROCHANTERIC Right 05/02/2018   Procedure: INTRAMEDULLARY (IM) NAIL INTERTROCHANTRIC;  Surgeon: [name], MD;  Location: [facility];  Service: Orthopedics;  Laterality: Right;  . IR NEPHROSTOMY EXCHANGE LEFT  03/25/2019  . IR NEPHROSTOMY EXCHANGE LEFT  04/19/2019  . IR NEPHROSTOMY EXCHANGE LEFT  05/31/2019  . IR NEPHROSTOMY EXCHANGE LEFT  07/19/2019  . IR NEPHROSTOMY EXCHANGE LEFT  09/13/2019  . IR NEPHROSTOMY EXCHANGE LEFT  11/22/2019  . IR NEPHROSTOMY EXCHANGE RIGHT  03/25/2019  . IR NEPHROSTOMY EXCHANGE RIGHT  04/19/2019  . IR NEPHROSTOMY EXCHANGE RIGHT  05/31/2019  . IR NEPHROSTOMY EXCHANGE RIGHT  07/19/2019  . IR NEPHROSTOMY EXCHANGE RIGHT  09/13/2019  . IR NEPHROSTOMY EXCHANGE RIGHT  11/22/2019  . IR NEPHROSTOMY PLACEMENT LEFT  02/23/2019  . IR NEPHROSTOMY PLACEMENT RIGHT  02/23/2019  . LAPAROTOMY N/A 06/08/2019   Procedure: EXPLORATORY LAPAROTOMY;  Surgeon: Herbert Pun, MD;  Location: [facility];  Service: General;  Laterality: N/A;  . LEG TENDON SURGERY Right 1958  . PORTA CATH INSERTION N/A 01/22/2018   Procedure: PORTA CATH INSERTION;  Surgeon: Lucky Cowboy,  Erskine Squibb, MD;  Location: [facility];  Service: Cardiovascular;  Laterality: N/A;  . TRANSURETHRAL RESECTION OF BLADDER TUMOR N/A 12/27/2017   Procedure: TRANSURETHRAL RESECTION OF BLADDER TUMOR (TURBT);  Surgeon: [name], MD;  Location: [facility];  Service: Urology;  Laterality: N/A;    . TRANSURETHRAL RESECTION OF BLADDER TUMOR N/A 01/15/2018   Procedure: TRANSURETHRAL RESECTION OF BLADDER TUMOR (TURBT);  Surgeon: [name], MD;  Location: [facility];  Service: Urology;  Laterality: N/A;  Need 2 hrs for this case please  . TRANSURETHRAL RESECTION OF BLADDER TUMOR N/A 07/18/2018   Procedure: TRANSURETHRAL RESECTION OF BLADDER TUMOR (TURBT);  Surgeon: [name], MD;  Location: [facility];  Service: Urology;  Laterality: N/A;    Social History   Socioeconomic History  . Marital status: Married    Spouse name: ruth  . Number of children: Not on file  . Years of education: Not on file  . Highest education level: Not on file  Occupational History  . Occupation: retired    Comment: Therapist, [employer]  Tobacco Use  . Smoking status: Never Smoker  . Smokeless tobacco: Never Used  Substance and Sexual Activity  . Alcohol use: No    Alcohol/week: 0.0 standard drinks  . Drug use: No  . Sexual activity: Not Currently  Other Topics Concern  . Not on file  Social History Narrative  . Not on file   Social Determinants of Health   Financial Resource Strain: Unknown  . Difficulty of Paying Living Expenses: Patient refused  Food Insecurity: Unknown  . Worried About Charity fundraiser in the Last Year: Patient refused  . Ran Out of Food in the Last Year: Patient refused  Transportation Needs: Unknown  . Lack of Transportation (Medical): Patient refused  . Lack of Transportation (Non-Medical): Patient refused  Physical Activity: Unknown  . Days of Exercise per Week: Patient refused  . Minutes of Exercise per Session: Patient refused  Stress: Unknown  . Feeling of Stress : Patient refused  Social Connections: Unknown  . Frequency of Communication with Friends and Family: Patient refused  . Frequency of Social Gatherings with Friends and Family: Patient refused  . Attends Religious Services: Patient refused  . Active Member of Clubs or Organizations: Patient refused   . Attends Archivist Meetings: Patient refused  . Marital Status: Patient refused  Intimate Partner Violence: Unknown  . Fear of Current or Ex-Partner: Patient refused  . Emotionally Abused: Patient refused  . Physically Abused: Patient refused  . Sexually Abused: Patient refused     Family History  Problem Relation Age of Onset  . Cancer Mother   . Chronic Renal Failure Mother   . Heart disease Father      Current Facility-Administered Medications:  .  0.9 %  sodium chloride infusion, , Intravenous, Continuous, Mansy, Jan A, MD, Last Rate: 100 mL/hr at 11/25/19 0403, New Bag at 11/25/19 0403 .  acetaminophen (TYLENOL) tablet 650 mg, 650 mg, Oral, Q6H PRN **OR** acetaminophen (TYLENOL) suppository 650 mg, 650 mg, Rectal, Q6H PRN, Mansy, Jan A, MD .  Chlorhexidine Gluconate Cloth 2 % PADS 6 each, 6 each, Topical, Daily, Nicole Kindred A, DO .  enoxaparin (LOVENOX) injection 40 mg, 40 mg, Subcutaneous, Q24H, Mansy, Jan A, MD, 40 mg at 11/24/19 2234 .  feeding supplement (ENSURE ENLIVE) (ENSURE ENLIVE) liquid 237 mL, 237 mL, Oral, BID BM, Mansy, Jan A, MD, 237 mL at 11/25/19 1506 .  ferrous sulfate tablet 325  mg, 325 mg, Oral, BID WC, Mansy, Jan A, MD, 325 mg at 11/25/19 0905 .  loratadine (CLARITIN) tablet 10 mg, 10 mg, Oral, Daily, Mansy, Jan A, MD, 10 mg at 11/25/19 0905 .  magnesium hydroxide (MILK OF MAGNESIA) suspension 30 mL, 30 mL, Oral, Daily PRN, Mansy, Jan A, MD .  mirabegron ER [facility]) tablet 50 mg, 50 mg, Oral, Daily, Mansy, Jan A, MD, 50 mg at 11/25/19 0905 .  morphine 2 MG/ML injection 2 mg, 2 mg, Intravenous, Q4H PRN, Mansy, Jan A, MD, 2 mg at 11/25/19 0359 .  multivitamin with minerals tablet 1 tablet, 1 tablet, Oral, Daily, Mansy, Jan A, MD, 1 tablet at 11/25/19 0905 .  ondansetron (ZOFRAN) tablet 4 mg, 4 mg, Oral, Q6H PRN **OR** ondansetron (ZOFRAN) injection 4 mg, 4 mg, Intravenous, Q6H PRN, Mansy, Jan A, MD, 4 mg at 11/24/19 0357 .  oxyCODONE (Oxy  IR/ROXICODONE) immediate release tablet 5 mg, 5 mg, Oral, Q4H PRN, Mansy, Jan A, MD, 5 mg at 11/24/19 2233 .  polyethylene glycol (MIRALAX / GLYCOLAX) packet 17 g, 17 g, Oral, Daily PRN, Mansy, Jan A, MD .  polyvinyl alcohol (LIQUIFILM TEARS) 1.4 % ophthalmic solution 1 drop, 1 drop, Both Eyes, Daily PRN, Mansy, Jan A, MD .  potassium chloride SA (KLOR-CON) CR tablet 40 mEq, 40 mEq, Oral, Q4H, Nicole Kindred A, DO, 40 mEq at 11/25/19 1256 .  prochlorperazine (COMPAZINE) tablet 10 mg, 10 mg, Oral, Q6H PRN, Mansy, Jan A, MD .  traZODone (DESYREL) tablet 25 mg, 25 mg, Oral, QHS PRN, Mansy, Jan A, MD, 25 mg at 11/23/19 2141 .  vitamin B-12 (CYANOCOBALAMIN) tablet 500 mcg, 500 mcg, Oral, Daily, Mansy, Jan A, MD, 500 mcg at 11/25/19 0905   Physical exam:  Vitals:   11/25/19 0357 11/25/19 0527 11/25/19 0903 11/25/19 1519  BP: (!) 129/58 (!) 101/52 (!) 107/56 102/63  Pulse: 96 100 94 88  Resp:  14  18  Temp:  98.7 F (37.1 C)  97.8 F (36.6 C)  TempSrc:  Oral  Oral  SpO2:  97% 98% 99%  Weight:      Height:       Physical Exam Constitutional:      Comments: Patient is extremely hard of hearing  HENT:     Head: Normocephalic and atraumatic.  Eyes:     Pupils: Pupils are equal, round, and reactive to light.  Cardiovascular:     Rate and Rhythm: Normal rate and regular rhythm.     Heart sounds: Normal heart sounds.  Pulmonary:     Effort: Pulmonary effort is normal.     Breath sounds: Normal breath sounds.  Abdominal:     General: Bowel sounds are normal.     Palpations: Abdomen is soft.     Comments: B/l nephrostomy tubes in place. Diverting colostomy in place. Bag over fungating anterior abdominal wound with surrounding induration and erythema  Musculoskeletal:     Cervical back: Normal range of motion.  Skin:    General: Skin is warm and dry.  Neurological:     Mental Status: He is alert and oriented to person, place, and time.        CMP Latest Ref Rng & Units 11/25/2019    Glucose 70 - 99 mg/dL 89  BUN 8 - 23 mg/dL 19  Creatinine 0.61 - 1.24 mg/dL 1.08  Sodium 135 - 145 mmol/L 140  Potassium 3.5 - 5.1 mmol/L 3.3(L)  Chloride 98 - 111 mmol/L 107  CO2  22 - 32 mmol/L 23  Calcium 8.9 - 10.3 mg/dL 8.6(L)  Total Protein 6.5 - 8.1 g/dL 5.6(L)  Total Bilirubin 0.3 - 1.2 mg/dL 0.7  Alkaline Phos 38 - 126 U/L 76  AST 15 - 41 U/L 12(L)  ALT 0 - 44 U/L 9   CBC Latest Ref Rng & Units 11/25/2019  WBC 4.0 - 10.5 K/uL 28.5(H)  Hemoglobin 13.0 - 17.0 g/dL 9.7(L)  Hematocrit 39.0 - 52.0 % 31.5(L)  Platelets 150 - 400 K/uL 342    @IMAGES @  CT ABDOMEN PELVIS W CONTRAST  Result Date: 11/23/2019 CLINICAL DATA:  Concern for abscess or infection on adjacent the inferior stoma site EXAM: CT ABDOMEN AND PELVIS WITH CONTRAST TECHNIQUE: Multidetector CT imaging of the abdomen and pelvis was performed using the standard protocol following bolus administration of intravenous contrast. CONTRAST:  18mL OMNIPAQUE IOHEXOL 300 MG/ML  SOLN COMPARISON:  Radiograph 10/13/2019 FINDINGS: Lower chest: Bilateral effusions, small on the right and trace on the left, similar to slightly decreased in size from comparison study. Adjacent passive atelectasis with more bandlike areas of scarring and/or subsegmental atelectatic changes well. Normal heart size. No pericardial effusion. Few coronary artery calcifications are noted. Hepatobiliary: No focal liver abnormality is seen. Patient is post cholecystectomy. Slight prominence of the biliary tree likely related to reservoir effect. No calcified intraductal gallstones. Pancreas: Unremarkable. No pancreatic ductal dilatation or surrounding inflammatory changes. Spleen: Normal in size without focal abnormality. Adrenals/Urinary Tract: Normal adrenal glands bilateral percutaneous nephrostomy tubes are in place. Pigtail catheter tips terminate appropriately within the proximal collecting systems. Some mild urothelial thickening is noted in both renal pelves,  nonspecific. Redemonstration of the large complex centrally necrotic, enhancing mass lesion arising from the region of the bladder and extending directly through the anterior abdominal wall. There is diminished intraluminal gas but with increasing cystic components of this lesion which measures approximately 12.2 x 10.4 x 13 cm in size. Visible necked connection with the urinary bladder is noted posteriorly (2/71) with extension to the anterior midline urostomy (2/66). Surrounding phlegmonous changes noted with adjacent reactive free fluid as well. Stomach/Bowel: Distal esophagus, stomach and duodenal sweep are unremarkable. No small bowel wall thickening or dilatation. No evidence of obstruction. A normal appendix is visualized. Diverting loop colostomy noted in the mid abdomen (2/26) distal colon demonstrates some scattered colonic diverticula without focal pericolonic inflammation the suggest diverticulitis. More diffuse segmental thickening is noted in the sigmoid colon as o'clock passes in close proximity to the heterogeneous bladder mass detailed above, the significance of which is indeterminate. Inspissated stool ball is noted within the rectum. Vascular/Lymphatic: Atherosclerotic plaque within the normal caliber aorta. Prominent retroperitoneal nodes are present though no grossly enlarged abdominopelvic adenopathy is seen. Reproductive: Postsurgical changes noted of the mildly enlarged prostate. Keyhole defect within the prostate could reflect sequela of prior TURP. Direct involvement of the prostate with the large, heterogeneous bladder mass. Other: Reactive free fluid layering in the deep pelvis, similar in extent to comparison. 4 Musculoskeletal: The osseous structures appear diffusely demineralized which may limit detection of small or nondisplaced fractures. Multilevel degenerative changes are present in the imaged portions of the spine. Bony fusion of L1-2 L3. 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 Mild canal stenosis and moderate bilateral neural foraminal narrowing at L5-S1. Severe degenerative changes noted in both hips. More moderate degenerative change noted in the SI joints and pubic symphysis. Prior right femoral intramedullary nail and transcervical fixation. IMPRESSION: 1. Redemonstration of the large complex centrally  necrotic, enhancing mass arising from the region of the bladder and extending directly through the anterior abdominal wall. There is diminished intraluminal gas but with increasing cystic components of this lesion which measures approximately 12.2 x 10.4 x 13 cm in size. Visible connection with the urinary bladder is noted posteriorly with extension to the anterior midline urostomy. Surrounding phlegmonous changes noted with adjacent reactive free fluid as well. 2. Bilateral percutaneous nephrostomy tubes are in place with mild urothelial thickening in both renal pelves, nonspecific and possibly reactive due to the presence of the stents versus secondary to urinary tract infection. 3. Bilateral pleural effusions, small on the right and trace on the left, similar to slightly decreased in size from comparison study. 4. Distal colonic thickening within the sigmoid in close proximity to the bladder mass, as detailed above. Significance of which is indeterminate. Diverting loop colostomy in place. 5. Inspissated stool ball within the rectum may represent fecal impaction. Electronically Signed   By: Lovena Le M.D.   On: 11/23/2019 18:37   IR NEPHROSTOMY EXCHANGE LEFT  Result Date: 11/22/2019 INDICATION: History of bladder cancer with chronic bilateral percutaneous nephrostomy catheters. Patient presents today for routine fluoroscopic guided exchange. EXAM: FLUOROSCOPIC GUIDED BILATERAL SIDED NEPHROSTOMY CATHETER EXCHANGE COMPARISON:  Multiple previous fluoroscopic guided nephrostomy catheter exchanges, most recently on 09/13/2019; CT abdomen  pelvis-10/13/2019 CONTRAST:  A total of 20 mL Isovue-300 administered was administered into both collecting systems FLUOROSCOPY TIME:  1 minute, 24 seconds (23 mGy) COMPLICATIONS: None immediate. TECHNIQUE: Informed written consent was obtained from the patient after a discussion of the risks, benefits and alternatives to treatment. Questions regarding the procedure were encouraged and answered. A timeout was performed prior to the initiation of the procedure. The bilateral flanks and external portions of existing nephrostomy catheters were prepped and draped in the usual sterile fashion. A sterile drape was applied covering the operative field. Maximum barrier sterile technique with sterile gowns and gloves were used for the procedure. A timeout was performed prior to the initiation of the procedure. A pre procedural spot fluoroscopic image was obtained. Beginning with the left-sided nephrostomy, a small amount of contrast was injected via the existing left-sided nephrostomy catheter demonstrating appropriate positioning within the renal pelvis. The existing nephrostomy catheter was cut and cannulated with a Benson wire which was coiled within the renal pelvis. Under intermittent fluoroscopic guidance, the existing nephrostomy catheter was exchanged for a new 12 [place] all-purpose drainage catheter. Limited contrast injection confirmed appropriate positioning within the left renal pelvis and a post exchange fluoroscopic image was obtained. The catheter was locked, secured to the skin with an interrupted suture and reconnected to a gravity bag. The identical repeat procedure was repeated for the contralateral right-sided nephrostomy, ultimately allowing successful exchange of a new 10.2 [place] all-purpose drainage catheter with end coiled and locked within the right renal pelvis. Dressings were placed. The patient tolerated the above procedures well without immediate postprocedural complication. FINDINGS: The  existing nephrostomy catheters are appropriately positioned and functioning. After successful fluoroscopic guided exchange, new bilateral 12 [place] nephrostomy catheters are coiled and locked within the respective renal pelvises. IMPRESSION: Successful fluoroscopic guided exchange of bilateral 12 French percutaneous nephrostomy catheters. Electronically Signed   By: Sandi Mariscal M.D.   On: 11/22/2019 10:54   IR NEPHROSTOMY EXCHANGE RIGHT  Result Date: 11/22/2019 INDICATION: History of bladder cancer with chronic bilateral percutaneous nephrostomy catheters. Patient presents today for routine fluoroscopic guided exchange. EXAM: FLUOROSCOPIC GUIDED BILATERAL SIDED NEPHROSTOMY CATHETER EXCHANGE COMPARISON:  Multiple  previous fluoroscopic guided nephrostomy catheter exchanges, most recently on 09/13/2019; CT abdomen pelvis-10/13/2019 CONTRAST:  A total of 20 mL Isovue-300 administered was administered into both collecting systems FLUOROSCOPY TIME:  1 minute, 24 seconds (23 mGy) COMPLICATIONS: None immediate. TECHNIQUE: Informed written consent was obtained from the patient after a discussion of the risks, benefits and alternatives to treatment. Questions regarding the procedure were encouraged and answered. A timeout was performed prior to the initiation of the procedure. The bilateral flanks and external portions of existing nephrostomy catheters were prepped and draped in the usual sterile fashion. A sterile drape was applied covering the operative field. Maximum barrier sterile technique with sterile gowns and gloves were used for the procedure. A timeout was performed prior to the initiation of the procedure. A pre procedural spot fluoroscopic image was obtained. Beginning with the left-sided nephrostomy, a small amount of contrast was injected via the existing left-sided nephrostomy catheter demonstrating appropriate positioning within the renal pelvis. The existing nephrostomy catheter was cut and cannulated  with a Benson wire which was coiled within the renal pelvis. Under intermittent fluoroscopic guidance, the existing nephrostomy catheter was exchanged for a new 12 [place] all-purpose drainage catheter. Limited contrast injection confirmed appropriate positioning within the left renal pelvis and a post exchange fluoroscopic image was obtained. The catheter was locked, secured to the skin with an interrupted suture and reconnected to a gravity bag. The identical repeat procedure was repeated for the contralateral right-sided nephrostomy, ultimately allowing successful exchange of a new 10.2 [place] all-purpose drainage catheter with end coiled and locked within the right renal pelvis. Dressings were placed. The patient tolerated the above procedures well without immediate postprocedural complication. FINDINGS: The existing nephrostomy catheters are appropriately positioned and functioning. After successful fluoroscopic guided exchange, new bilateral 12 French nephrostomy catheters are coiled and locked within the respective renal pelvises. IMPRESSION: Successful fluoroscopic guided exchange of bilateral 12 French percutaneous nephrostomy catheters. Electronically Signed   By: Sandi Mariscal M.D.   On: 11/22/2019 10:54    Assessment and plan- Patient is a 85 y.o. male with locally advanced urothelial carcinoma causing bowel obstruction, fungating anterior abdominal wall wound as well as bilateral hydronephrosis s/p bilateral nephrostomy tubes, diverting colostomy admitted for worsening lower abdominal pain and redness  Patient reports pain is somewhat better today. I spoke to the patients wife [name] over the phone. Explained to her again that patients condition was declining progressively. He is not a candidate for any systemic treatment. He has a fungating wound through the anterior abdominal wall due to underlying malignancy for which he underwent palliative Rt without any significant benefit. There is no surgical  option for this and this is not infection either. This wound is likely to get worse with time and result in more pain. Unfortunately there are no meaningful options to control the growth of tumor. Mainstay of management would be pain control and local wound management.  Patients wife is still considering hospice but does not want to commit yet. She wishes to continue home palliative care upon discharge. I will work on his pain management meds as an outpatient   Visit Diagnosis 1. Cellulitis of abdominal wall     Dr. Randa Evens, MD, MPH [facility] at [facility] [phone] 11/25/2019

## 2019-11-25 NOTE — Plan of Care (Signed)

## 2019-11-25 NOTE — Consult Note (Signed)
NAME: Vincent Poole  DOB: [birth year]  MRN: [id number]  Date/Time: 11/25/2019 2:39 PM  REQUESTING PROVIDER: Dr.Griffith Subjective:  REASON FOR CONSULT: abdominal wall cellulitis ?spoke to his wife. Pt not able to give history because of narcotics Vincent Poole is a 85 y.o. male with a history of extensive local bladder cancer extending to the abdominal wall and opening on the skin  bilateral hydronephrosis s/p nephrostomy tubes ,s/p  bilateral stents, large bowel obstruction s/p loop colostomy Presented to the ED on 11/23/19 with leaking from the ostomy bag covering the bladder cancer penetrating thru the skin. Pt had change on b/l PCN tubes on 11/22/19 and wife noted the ostomy was leaking. She thinks it could be because he may have been on his stomach the previous dy for the nephrostomy exchange    No fever- pt has been declining since the past few months. Pt has failed all therapy for bladder cancer including chemo, radiation, immune therapy and his last visit to Dr.Roa she had decided on no no further treatment exceptive supportive  In the ED temp 98, BP 125/63, HR 118 WBC 27.4 Blood culture sent- 1/2 coag neg staph Swab of the kin staph epi  CT abdomen shows Redemonstration of the large complex centrally necrotic,enhancing mass arising from the region of the bladder and extending directly through the anterior abdominal wall. There is diminished intraluminal gas but with increasing cystic components of this lesion which measures approximately 12.2 x 10.4 x 13 cm in size. Visible connection with the urinary bladder is noted posteriorly with extension to the anterior midline urostomy. Surrounding phlegmonous changes noted with adjacent reactive free fluid as well.   Medical history  He was initially diagnosed with bladder ca in Feb 2019 , he had locally advanced muscle invasive bladder ca and has had ureteral stents placed Feb 2019. He also had TRUBT in march 2019, He got chemo with 5  cycles of gemcitabine and carboplatin and radiation. In June 2019 he had a fall and fractured his rt femur and underwentOpen reduction and internal fixation of arightintertrochanteric femur fractureon 05/03/19  Sept 2019 a biopsy of bladder neck showed recurrent high grade urothelial carcinoma He was started on palliative Tecentriq in sept 2019 by oncologist - he has been getting frequent ureteral stent changes and also has a chronic foley. He has a a contracted bladder <50 cc and the foley is being changed over a wire. Since feb 2020 his wbc has been gradually going up and he received 2 prolonged courses of antibiotic for enterococcus fecalis in the urine cultured on 2/27 and 02/04/19. In April 2020 was admitted for ongoing leucocytosis around 40 K and had b/l nephrostomy tubes placed which did not impact the leukocytosis.  Patient has been following with oncologist and urologist as outpatient.  He has been getting Tecentriq.  He was readmitted in May 2020.  With urinary retention.  The nephrostomy tubes were changed by IR.  The urine culture had Serratia marcescens and he was treated with IV antibiotics initially and then sent on p.o. Bactrim.  He had another admission in June 2020 and was seen by palliative both patient and wife wanted to continue aggressive treatment.  Patient was hospitalized between 06/06/2019 until 06/12/2019 for large bowel obstruction due to the bladder tumor and underwent fecal diversion and transfers loop colostomy on 06/08/2019.  Marland Kitchen  Patient started n a new antineoplastic agent   PADCEV-Enfortumab and then local radiation but the malignancy ha progressed  Past Medical History:  Diagnosis Date  . Anxiety   . Arthritis   . Bladder cancer ([place])   . Dysrhythmia 07/2018   history of atrial flutter that worsens with anxiety  . Femur fracture, right ([place]) 05/01/2018  . GERD (gastroesophageal reflux disease)   . History of recent blood transfusion 05/2018  . Hypertension     . Iron deficiency anemia 09/14/2018  . Prostate cancer ([place]) 07/2018   cancer growing in prostate but not prostate cancer, it is from the bladder  . Umbilical hernia 12/7251  . Urinary retention 2019   foley catheter place 11/2017  . UTI (urinary tract infection) 2019   frequent UTI's over last year  . Wound eschar of foot 07/2018   left heal getting wrapped and requiring antibiotic cream. cracks open with weight bearing.    Past Surgical History:  Procedure Laterality Date  . CARPAL TUNNEL RELEASE Right   . CHOLECYSTECTOMY  2004  . COLOSTOMY N/A 06/08/2019   Procedure: COLOSTOMY;  Surgeon: Herbert Pun, MD;  Location: [facility];  Service: General;  Laterality: N/A;  . CYSTOGRAM  07/18/2018   Procedure: CYSTOGRAM;  Surgeon: [name], MD;  Location: [facility];  Service: Urology;;  . Consuela Mimes W/ RETROGRADES Bilateral 07/18/2018   Procedure: CYSTOSCOPY WITH RETROGRADE PYELOGRAM;  Surgeon: [name], MD;  Location: [facility];  Service: Urology;  Laterality: Bilateral;  . CYSTOSCOPY W/ URETERAL STENT PLACEMENT Bilateral 12/27/2017   Procedure: CYSTOSCOPY WITH RETROGRADE PYELOGRAM/URETERAL STENT PLACEMENT;  Surgeon: [name], MD;  Location: [facility];  Service: Urology;  Laterality: Bilateral;  . CYSTOSCOPY W/ URETERAL STENT PLACEMENT Bilateral 07/18/2018   Procedure: CYSTOSCOPY WITH STENT REPLACEMENT (exchange);  Surgeon: [name], MD;  Location: [facility];  Service: Urology;  Laterality: Bilateral;  . CYSTOSCOPY WITH STENT PLACEMENT Bilateral 11/12/2018   Procedure: [place] WITH STENT Exchange;  Surgeon: [name], MD;  Location: [facility];  Service: Urology;  Laterality: Bilateral;  . INTRAMEDULLARY (IM) NAIL INTERTROCHANTERIC Right 05/02/2018   Procedure: INTRAMEDULLARY (IM) NAIL INTERTROCHANTRIC;  Surgeon: [name], MD;  Location: [facility];  Service: Orthopedics;  Laterality: Right;  . IR NEPHROSTOMY EXCHANGE LEFT  03/25/2019  . IR NEPHROSTOMY  EXCHANGE LEFT  04/19/2019  . IR NEPHROSTOMY EXCHANGE LEFT  05/31/2019  . IR NEPHROSTOMY EXCHANGE LEFT  07/19/2019  . IR NEPHROSTOMY EXCHANGE LEFT  09/13/2019  . IR NEPHROSTOMY EXCHANGE LEFT  11/22/2019  . IR NEPHROSTOMY EXCHANGE RIGHT  03/25/2019  . IR NEPHROSTOMY EXCHANGE RIGHT  04/19/2019  . IR NEPHROSTOMY EXCHANGE RIGHT  05/31/2019  . IR NEPHROSTOMY EXCHANGE RIGHT  07/19/2019  . IR NEPHROSTOMY EXCHANGE RIGHT  09/13/2019  . IR NEPHROSTOMY EXCHANGE RIGHT  11/22/2019  . IR NEPHROSTOMY PLACEMENT LEFT  02/23/2019  . IR NEPHROSTOMY PLACEMENT RIGHT  02/23/2019  . LAPAROTOMY N/A 06/08/2019   Procedure: EXPLORATORY LAPAROTOMY;  Surgeon: Herbert Pun, MD;  Location: [facility];  Service: General;  Laterality: N/A;  . LEG TENDON SURGERY Right 1958  . PORTA CATH INSERTION N/A 01/22/2018   Procedure: PORTA CATH INSERTION;  Surgeon: [name], MD;  Location: [facility];  Service: Cardiovascular;  Laterality: N/A;  . TRANSURETHRAL RESECTION OF BLADDER TUMOR N/A 12/27/2017   Procedure: TRANSURETHRAL RESECTION OF BLADDER TUMOR (TURBT);  Surgeon: [name], MD;  Location: [facility];  Service: Urology;  Laterality: N/A;  . TRANSURETHRAL RESECTION OF BLADDER TUMOR N/A 01/15/2018   Procedure: TRANSURETHRAL RESECTION OF BLADDER TUMOR (TURBT);  Surgeon: [name], MD;  Location: [facility];  Service: Urology;  Laterality:  N/A;  Need 2 hrs for this case please  . TRANSURETHRAL RESECTION OF BLADDER TUMOR N/A 07/18/2018   Procedure: TRANSURETHRAL RESECTION OF BLADDER TUMOR (TURBT);  Surgeon: [name], MD;  Location: [facility];  Service: Urology;  Laterality: N/A;    Social History   Socioeconomic History  . Marital status: Married    Spouse name: ruth  . Number of children: Not on file  . Years of education: Not on file  . Highest education level: Not on file  Occupational History  . Occupation: retired    Comment: Therapist, [employer]  Tobacco Use  . Smoking status: Never Smoker  .  Smokeless tobacco: Never Used  Substance and Sexual Activity  . Alcohol use: No    Alcohol/week: 0.0 standard drinks  . Drug use: No  . Sexual activity: Not Currently  Other Topics Concern  . Not on file  Social History Narrative  . Not on file   Social Determinants of Health   Financial Resource Strain: Unknown  . Difficulty of Paying Living Expenses: Patient refused  Food Insecurity: Unknown  . Worried About Charity fundraiser in the Last Year: Patient refused  . Ran Out of Food in the Last Year: Patient refused  Transportation Needs: Unknown  . Lack of Transportation (Medical): Patient refused  . Lack of Transportation (Non-Medical): Patient refused  Physical Activity: Unknown  . Days of Exercise per Week: Patient refused  . Minutes of Exercise per Session: Patient refused  Stress: Unknown  . Feeling of Stress : Patient refused  Social Connections: Unknown  . Frequency of Communication with Friends and Family: Patient refused  . Frequency of Social Gatherings with Friends and Family: Patient refused  . Attends Religious Services: Patient refused  . Active Member of Clubs or Organizations: Patient refused  . Attends Archivist Meetings: Patient refused  . Marital Status: Patient refused  Intimate Partner Violence: Unknown  . Fear of Current or Ex-Partner: Patient refused  . Emotionally Abused: Patient refused  . Physically Abused: Patient refused  . Sexually Abused: Patient refused    Family History  Problem Relation Age of Onset  . Cancer Mother   . Chronic Renal Failure Mother   . Heart disease Father    No Known Allergies  ? Current Facility-Administered Medications  Medication Dose Route Frequency Provider Last Rate Last Admin  . 0.9 %  sodium chloride infusion   Intravenous Continuous Mansy, Jan A, MD 100 mL/hr at 11/25/19 0403 New Bag at 11/25/19 0403  . acetaminophen (TYLENOL) tablet 650 mg  650 mg Oral Q6H PRN Mansy, Jan A, MD       Or  .  acetaminophen (TYLENOL) suppository 650 mg  650 mg Rectal Q6H PRN Mansy, Jan A, MD      . Chlorhexidine Gluconate Cloth 2 % PADS 6 each  6 each Topical Daily Nicole Kindred A, DO      . enoxaparin (LOVENOX) injection 40 mg  40 mg Subcutaneous Q24H Mansy, Jan A, MD   40 mg at 11/24/19 2234  . feeding supplement (ENSURE ENLIVE) (ENSURE ENLIVE) liquid 237 mL  237 mL Oral BID BM Mansy, Jan A, MD   237 mL at 11/24/19 1008  . ferrous sulfate tablet 325 mg  325 mg Oral BID WC Mansy, Jan A, MD   325 mg at 11/25/19 0905  . loratadine (CLARITIN) tablet 10 mg  10 mg Oral Daily Mansy, Jan A, MD   10 mg at 11/25/19 0905  .  magnesium hydroxide (MILK OF MAGNESIA) suspension 30 mL  30 mL Oral Daily PRN Mansy, Jan A, MD      . mirabegron ER [facility]) tablet 50 mg  50 mg Oral Daily Mansy, Jan A, MD   50 mg at 11/25/19 0905  . morphine 2 MG/ML injection 2 mg  2 mg Intravenous Q4H PRN Mansy, Jan A, MD   2 mg at 11/25/19 0359  . multivitamin with minerals tablet 1 tablet  1 tablet Oral Daily Mansy, Jan A, MD   1 tablet at 11/25/19 0905  . ondansetron (ZOFRAN) tablet 4 mg  4 mg Oral Q6H PRN Mansy, Jan A, MD       Or  . ondansetron [facility]) injection 4 mg  4 mg Intravenous Q6H PRN Mansy, Jan A, MD   4 mg at 11/24/19 0357  . oxyCODONE (Oxy IR/ROXICODONE) immediate release tablet 5 mg  5 mg Oral Q4H PRN Mansy, Jan A, MD   5 mg at 11/24/19 2233  . polyethylene glycol (MIRALAX / GLYCOLAX) packet 17 g  17 g Oral Daily PRN Mansy, Jan A, MD      . polyvinyl alcohol (LIQUIFILM TEARS) 1.4 % ophthalmic solution 1 drop  1 drop Both Eyes Daily PRN Mansy, Jan A, MD      . potassium chloride SA (KLOR-CON) CR tablet 40 mEq  40 mEq Oral Q4H Griffith, Kelly A, DO   40 mEq at 11/25/19 1256  . prochlorperazine (COMPAZINE) tablet 10 mg  10 mg Oral Q6H PRN Mansy, Jan A, MD      . traZODone (DESYREL) tablet 25 mg  25 mg Oral QHS PRN Mansy, Jan A, MD   25 mg at 11/23/19 2141  . vitamin B-12 (CYANOCOBALAMIN) tablet 500 mcg  500 mcg Oral  Daily Mansy, Jan A, MD   500 mcg at 11/25/19 0905     Abtx:  Anti-infectives (From admission, onward)   Start     Dose/Rate Route Frequency Ordered Stop   11/24/19 2200  vancomycin (VANCOREADY) IVPB 1500 mg/300 mL  Status:  Discontinued     1,500 mg 150 mL/hr over 120 Minutes Intravenous Every 24 hours 11/24/19 0213 11/25/19 1439   11/24/19 0000  piperacillin-tazobactam (ZOSYN) IVPB 3.375 g  Status:  Discontinued     3.375 g 100 mL/hr over 30 Minutes Intravenous Every 6 hours 11/23/19 2101 11/23/19 2154   11/23/19 2200  piperacillin-tazobactam (ZOSYN) IVPB 3.375 g  Status:  Discontinued     3.375 g 12.5 mL/hr over 240 Minutes Intravenous Every 8 hours 11/23/19 2154 11/25/19 1439   11/23/19 2145  vancomycin (VANCOREADY) IVPB 750 mg/150 mL     750 mg 150 mL/hr over 60 Minutes Intravenous  Once 11/23/19 2131 11/23/19 2248   11/23/19 2100  vancomycin (VANCOCIN) IVPB 1000 mg/200 mL premix  Status:  Discontinued     1,000 mg 200 mL/hr over 60 Minutes Intravenous  Once 11/23/19 2049 11/23/19 2131   11/23/19 1945  vancomycin (VANCOCIN) IVPB 1000 mg/200 mL premix     1,000 mg 200 mL/hr over 60 Minutes Intravenous  Once 11/23/19 1935 11/23/19 2119   11/23/19 1945  cefTRIAXone (ROCEPHIN) 2 g in sodium chloride 0.9 % 100 mL IVPB     2 g 200 mL/hr over 30 Minutes Intravenous  Once 11/23/19 1935 11/23/19 2049      REVIEW OF SYSTEMS:  NA  Objective:  VITALS:  BP (!) 107/56 (BP Location: Right Arm)   Pulse 94   Temp 98.7 F (37.1 C) (  Oral)   Resp 14   Ht 6' (1.829 m)   Wt 68 kg   SpO2 98%   BMI 20.34 kg/m  PHYSICAL EXAM:  General: somnolent secondary to morphine Head: Normocephalic, without obvious abnormality, atraumatic. Temporal wasting emaciation Eyes: Conjunctivae clear, anicteric sclerae. Pupils are equal ENT:did not examine  Neck: Supple, Back:did not examine Lungs: b/l air entry in the front Heart: s1s2 Abdomen: Soft, colostomy bag Ostomy bag covering the fungating  bladder tumor     B/l nephrostomy tubes- clear urine  Extremities: atraumatic, no cyanosis. No edema. No clubbing Skin: limited examination Lymph: Cervical, supraclavicular normal. Neurologic: sleeping Pertinent Labs Lab Results CBC    Component Value Date/Time   WBC 28.5 (H) 11/25/2019 0638   RBC 3.30 (L) 11/25/2019 0638   HGB 9.7 (L) 11/25/2019 0638   HGB 11.6 (L) 02/20/2019 1404   HCT 31.5 (L) 11/25/2019 0638   HCT 36.5 (L) 02/20/2019 1404   PLT 342 11/25/2019 0638   PLT 461 (H) 02/20/2019 1404   MCV 95.5 11/25/2019 0638   MCV 94 02/20/2019 1404   MCH 29.4 11/25/2019 0638   MCHC 30.8 11/25/2019 0638   RDW 16.6 (H) 11/25/2019 0638   RDW 12.9 02/20/2019 1404   LYMPHSABS 1.7 11/25/2019 0638   LYMPHSABS 2.8 02/20/2019 1404   MONOABS 1.3 (H) 11/25/2019 0638   EOSABS 0.4 11/25/2019 0638   EOSABS 0.4 02/20/2019 1404   BASOSABS 0.1 11/25/2019 0638   BASOSABS 0.2 02/20/2019 1404    CMP Latest Ref Rng & Units 11/25/2019 11/24/2019 11/23/2019  Glucose 70 - 99 mg/dL 89 115(H) 143(H)  BUN 8 - 23 mg/dL 19 20 22   Creatinine 0.61 - 1.24 mg/dL 1.08 1.00 1.01  Sodium 135 - 145 mmol/L 140 139 136  Potassium 3.5 - 5.1 mmol/L 3.3(L) 3.5 3.7  Chloride 98 - 111 mmol/L 107 105 98  CO2 22 - 32 mmol/L 23 27 28   Calcium 8.9 - 10.3 mg/dL 8.6(L) 8.8(L) 9.4  Total Protein 6.5 - 8.1 g/dL 5.6(L) - 7.3  Total Bilirubin 0.3 - 1.2 mg/dL 0.7 - 0.5  Alkaline Phos 38 - 126 U/L 76 - 88  AST 15 - 41 U/L 12(L) - 15  ALT 0 - 44 U/L 9 - 12      Microbiology: Recent Results (from the past 240 hour(s))  Culture, blood (routine x 2)     Status: None (Preliminary result)   Collection Time: 11/23/19  5:12 PM   Specimen: BLOOD  Result Value Ref Range Status   Specimen Description BLOOD LEFT ANTECUBITAL  Final   Special Requests   Final    BOTTLES DRAWN AEROBIC AND ANAEROBIC Blood Culture results may not be optimal due to an excessive volume of blood received in culture bottles   Culture   Final     NO GROWTH 2 DAYS Performed at [facility], [address], [place], [place] [address]    Report Status PENDING  Incomplete  Culture, blood (routine x 2)     Status: Abnormal   Collection Time: 11/23/19  5:12 PM   Specimen: BLOOD LEFT HAND  Result Value Ref Range Status   Specimen Description BLOOD LEFT HAND  Final   Special Requests   Final    BOTTLES DRAWN AEROBIC AND ANAEROBIC Blood Culture adequate volume Performed at [facility], [address], [place], [place] [address]    Culture  Setup Time   Final    GRAM POSITIVE COCCI AEROBIC BOTTLE ONLY CRITICAL RESULT  CALLED TO, READ BACK BY AND VERIFIED WITH: JODY BAREFOOT ON 11/24/2019 AT 1234 TIK Performed at [facility], [address] [place], [place] [address]    Culture (A)  Final    STAPHYLOCOCCUS SPECIES (COAGULASE NEGATIVE) THE SIGNIFICANCE OF ISOLATING THIS ORGANISM FROM A SINGLE SET OF BLOOD CULTURES WHEN MULTIPLE SETS ARE DRAWN IS UNCERTAIN. PLEASE NOTIFY THE MICROBIOLOGY DEPARTMENT WITHIN ONE WEEK IF SPECIATION AND SENSITIVITIES ARE REQUIRED. Performed at [facility], [place] [address] [place], [place] [address]    Report Status 11/25/2019 FINAL  Final  SARS CORONAVIRUS 2 (TAT 6-24 HRS) Nasopharyngeal Nasopharyngeal Swab     Status: None   Collection Time: 11/24/19 12:38 AM   Specimen: Nasopharyngeal Swab  Result Value Ref Range Status   SARS Coronavirus 2 NEGATIVE NEGATIVE Final    Comment: (NOTE) SARS-CoV-2 target nucleic acids are NOT DETECTED. The SARS-CoV-2 RNA is generally detectable in upper and lower respiratory specimens during the acute phase of infection. Negative results do not preclude SARS-CoV-2 infection, do not rule out co-infections with other pathogens, and should not be used as the sole basis for treatment or other patient management decisions. Negative results must be combined with clinical observations, patient history, and epidemiological information. The  expected result is Negative. Fact Sheet for Patients: SugarRoll.be Fact Sheet for Healthcare Providers: https://www.woods-mathews.com/ This test is not yet approved or cleared by the [place] FDA and  has been authorized for detection and/or diagnosis of SARS-CoV-2 by FDA under an Emergency Use Authorization (EUA). This EUA will remain  in effect (meaning this test can be used) for the duration of the COVID-19 declaration under Section 56 4(b)(1) of the Act, 21 U.S.C. section 360bbb-3(b)(1), unless the authorization is terminated or revoked sooner. Performed at [facility], [place] [address], [place], [place] [address]   Aerobic/Anaerobic Culture (surgical/deep wound)     Status: None (Preliminary result)   Collection Time: 11/24/19  5:51 AM   Specimen: Abdomen; Wound  Result Value Ref Range Status   Specimen Description   Final    ABDOMEN Performed at [facility], [address] [place], [place] [address]    Special Requests   Final    NONE Performed at [facility], [address] [place], [place] [address]    Gram Stain   Final    NO WBC SEEN FEW GRAM POSITIVE COCCI IN PAIRS Performed at [facility], [place] [address], [place], [place] [address]    Culture FEW STAPHYLOCOCCUS EPIDERMIDIS  Final   Report Status PENDING  Incomplete    IMAGING RESULTS: I have personally reviewed the films ? Impression/Recommendation ? Fungating bladder cancer extending to the skin of the abdomen Necrotic tumor and local irritation of the skin This is not infection and no amount of antibiotics is going to help in this condition He always has a leukemoid reaction due to malignancy ?DC antibiotics Pt seen by surgeon and they concur   Bladder cancer with b/l ureter obstruction causing b/l hydro and has b/l PCN and b/l ureteral stents Pt has exhausted all options of treatment He need to be hospice/comfort  care   Coag neg staph in blood  1/2 is a skin contaminant ? _ Colon obstruction from bladder tumor leading to loop colostomy last year  Malnutrition  Anemia   Discussed with wife in great detail- iregardign his poor prognosis and him exhausting all Rx options and this not being an infection and antibiotics will not  help- broached hospice/Comfort care  Palliative is being consulted by primary team __________________________________________________ Discussed with hospitalist

## 2019-11-25 NOTE — Consult Note (Signed)
[place] Nurse ostomy consult note Stoma type/location: LUQ colostomy productive.  Requires 2 /34" pouch  Intact with brown stool in pouch  Bedside RN to manage.  WOC Nurse Consult Note: Reason for Consult: Cancerous lesion (bladder)  with cellulitis to anterior abdominal wall.  requires urostomy pouching.  Wound type:cancerous Pressure Injury POA:/NA Measurement: - raised lesion that leaks urine Wound bed: purulent Drainage (amount, consistency, odor)moderate weeping from peristomal opening that is painful and raw  Periwound: raw, weeping and purulence.  Painful to touch and pouch Dressing procedure/placement/frequency: Cleanse abdominal wall site with soap and water and gently pat dry.  Apply stomal powder and no sting skin prep x 3 layers. Barrier ring to peristomal skin around lesion opening.  Pouch with urostomy pouch (LAWSON # [id number]) and barrier (LAWSON # [id number]) barrier ring (LAWSON # [id number])  Stoma powder in room.  Will not follow at this time.  Please re-consult if needed.  Domenic Moras MSN, RN, FNP-BC CWON Wound, Ostomy, Continence Nurse Pager [phone]

## 2019-11-25 NOTE — Progress Notes (Addendum)
PROGRESS NOTE    Vincent Poole  CBS:[id number] DOB: 1935/05/26 DOA: 11/23/2019  PCP: [name], MD    LOS - 2   Brief Narrative:  85 year old male with history of locally invasive bladder cancer, prostate cancer hypertension, iron deficiency anemia, GERD.  He presented to the ED on 1/16 with lower abdominal pain, swelling and erythema and tenderness adjacent to his urostomy with urine leakage.   In the ED, tachycardic, normal.  Labs showed leukocytosis 28.6 neutrophil predominant lactate was 2.3 and repeat 2.0.  CT abdomen pelvis showed "redemonstration of the large complex centrally necrotic, enhancing mass arising from the region of the bladder and extending directly through the anterior abdominal wall. ...with increasing cystic components of this lesion which measures approximately 12.2 x 10.4 x 13 cm in size. Visible connection with the urinary bladder is noted posteriorly with extension to the anterior midline urostomy. Surrounding phlegmonous changes noted with adjacent reactive free fluid as well."  And other findings as outlined above.  Patient was treated with IV fluids,, Banken Rocephin and admitted to hospitalist service for further evaluation management.  Subjective 1/18: Patient reports feeling fine this morning.  When asking about any symptoms, answered "I'm fine" to all.  Overnight, urostomy bag came loose and had to be replaced.  States this was painful for him.  Denies fever/chills, N/V/D.    Assessment & Plan:   Principal Problem:   Abdominal wall cellulitis Active Problems:   Sepsis ([place])   Malignant neoplasm of urinary bladder ([facility])   Essential hypertension   Vitamin B12 deficiency   Sepsis secondary to: Abdominal wall purulent cellulitis with associated phlegmon Sepsis present upon admission as evidenced by leukocytosis, tachycardia and cellulitis infection. Patient's bladder mass is now protruding through his anterior abdominal wall causing him  significant pain.  Treatment options exhausted from oncology standpoint. -General surgery consulted - no debredement or surgery, either would do more harm than good  --ID consulted --Stopped antibiotics -Pain management including warm compresses  Essential hypertension -chronic, stable Not on medication at home.  Labetalol as needed.    Locally invasive bladder cancer --Pain control --Previously had palliative care consult but is now on hospice as he requested aggressive management --Discussed with Dr. Dwyane Luo, patient's oncologist.  No further therapies to offer from oncology standpoint.   --Palliative consult for consideration of hospice/comfort care  Vitamin B12 deficiency -continue supplement   DVT prophylaxis: Lovenox   Code Status: DNR  Family Communication: None at bedside Disposition Plan: Pending clinical improvement and PT eval  Consultants:   General surgery  Infectious Disease  Procedures:   None  Antimicrobials:   Vancomycin, start 1/16  Zosyn, start 1/16, stop TBD   Objective: Vitals:   11/24/19 2346 11/25/19 0357 11/25/19 0527 11/25/19 0903  BP: (!) 104/55 (!) 129/58 (!) 101/52 (!) 107/56  Pulse: 88 96 100 94  Resp: 14  14   Temp: 98.9 F (37.2 C)  98.7 F (37.1 C)   TempSrc: Oral  Oral   SpO2: 97%  97% 98%  Weight:      Height:        Intake/Output Summary (Last 24 hours) at 11/25/2019 1318 Last data filed at 11/25/2019 0900 Gross per 24 hour  Intake 332.64 ml  Output 500 ml  Net -167.36 ml   Filed Weights   11/23/19 1328  Weight: 68 kg    Examination:  General exam: awake, alert, no acute distress, frail HEENT: moist mucus membranes, hearing grossly normal  Respiratory system:  clear to auscultation bilaterally, no wheezes, rales or rhonchi, normal respiratory effort. Cardiovascular system: normal S1/S2, RRR, no JVD, no pedal edema.   Gastrointestinal system: tenderness surrounding urostomy, otherwise soft, non-tender,  non-distended, colostomy bag with dark brown stool Central nervous system: alert and oriented x3. no gross focal neurologic deficits, normal speech Extremities: moves all, no cyanosis, normal tone Skin: persistent erythema and induration with tenderness adjacent to urostomy   Psychiatry: normal mood, congruent affect, judgement and insight appear normal    Data Reviewed: I have personally reviewed following labs and imaging studies  CBC: Recent Labs  Lab 11/23/19 1337 11/24/19 0551 11/25/19 0638  WBC 28.6* 27.4* 28.5*  NEUTROABS 24.3*  --  24.6*  HGB 12.2* 9.8* 9.7*  HCT 39.0 31.2* 31.5*  MCV 93.3 94.8 95.5  PLT 469* 330 248   Basic Metabolic Panel: Recent Labs  Lab 11/23/19 1337 11/24/19 0551 11/25/19 0638  NA 136 139 140  K 3.7 3.5 3.3*  CL 98 105 107  CO2 28 27 23   GLUCOSE 143* 115* 89  BUN 22 20 19   CREATININE 1.01 1.00 1.08  CALCIUM 9.4 8.8* 8.6*  MG  --   --  1.7   GFR: Estimated Creatinine Clearance: 50.7 mL/min (by C-G formula based on SCr of 1.08 mg/dL). Liver Function Tests: Recent Labs  Lab 11/23/19 1337 11/25/19 0638  AST 15 12*  ALT 12 9  ALKPHOS 88 76  BILITOT 0.5 0.7  PROT 7.3 5.6*  ALBUMIN 3.1* 2.2*   No results for input(s): LIPASE, AMYLASE in the last 168 hours. No results for input(s): AMMONIA in the last 168 hours. Coagulation Profile: No results for input(s): INR, PROTIME in the last 168 hours. Cardiac Enzymes: No results for input(s): CKTOTAL, CKMB, CKMBINDEX, TROPONINI in the last 168 hours. BNP (last 3 results) No results for input(s): PROBNP in the last 8760 hours. HbA1C: No results for input(s): HGBA1C in the last 72 hours. CBG: No results for input(s): GLUCAP in the last 168 hours. Lipid Profile: No results for input(s): CHOL, HDL, LDLCALC, TRIG, CHOLHDL, LDLDIRECT in the last 72 hours. Thyroid Function Tests: No results for input(s): TSH, T4TOTAL, FREET4, T3FREE, THYROIDAB in the last 72 hours. Anemia Panel: No results  for input(s): VITAMINB12, FOLATE, FERRITIN, TIBC, IRON, RETICCTPCT in the last 72 hours. Sepsis Labs: Recent Labs  Lab 11/23/19 1337 11/23/19 1712 11/25/19 2500  LATICACIDVEN 2.3* 2.0* 0.7    Recent Results (from the past 240 hour(s))  Culture, blood (routine x 2)     Status: None (Preliminary result)   Collection Time: 11/23/19  5:12 PM   Specimen: BLOOD  Result Value Ref Range Status   Specimen Description BLOOD LEFT ANTECUBITAL  Final   Special Requests   Final    BOTTLES DRAWN AEROBIC AND ANAEROBIC Blood Culture results may not be optimal due to an excessive volume of blood received in culture bottles   Culture   Final    NO GROWTH 2 DAYS Performed at [facility], [address], [place], [place] [address]    Report Status PENDING  Incomplete  Culture, blood (routine x 2)     Status: None (Preliminary result)   Collection Time: 11/23/19  5:12 PM   Specimen: BLOOD LEFT HAND  Result Value Ref Range Status   Specimen Description BLOOD LEFT HAND  Final   Special Requests   Final    BOTTLES DRAWN AEROBIC AND ANAEROBIC Blood Culture adequate volume   Culture  Setup Time  Final    GRAM POSITIVE COCCI AEROBIC BOTTLE ONLY CRITICAL RESULT CALLED TO, READ BACK BY AND VERIFIED WITH: JODY BAREFOOT ON 11/24/2019 AT 1234 TIK Performed at [facility], [address] [place], [place] [address]    Culture [facility] POSITIVE COCCI  Final   Report Status PENDING  Incomplete  SARS CORONAVIRUS 2 (TAT 6-24 HRS) Nasopharyngeal Nasopharyngeal Swab     Status: None   Collection Time: 11/24/19 12:38 AM   Specimen: Nasopharyngeal Swab  Result Value Ref Range Status   SARS Coronavirus 2 NEGATIVE NEGATIVE Final    Comment: (NOTE) SARS-CoV-2 target nucleic acids are NOT DETECTED. The SARS-CoV-2 RNA is generally detectable in upper and lower respiratory specimens during the acute phase of infection. Negative results do not preclude SARS-CoV-2 infection, do not rule  out co-infections with other pathogens, and should not be used as the sole basis for treatment or other patient management decisions. Negative results must be combined with clinical observations, patient history, and epidemiological information. The expected result is Negative. Fact Sheet for Patients: SugarRoll.be Fact Sheet for Healthcare Providers: https://www.woods-mathews.com/ This test is not yet approved or cleared by the [place] FDA and  has been authorized for detection and/or diagnosis of SARS-CoV-2 by FDA under an Emergency Use Authorization (EUA). This EUA will remain  in effect (meaning this test can be used) for the duration of the COVID-19 declaration under Section 56 4(b)(1) of the Act, 21 U.S.C. section 360bbb-3(b)(1), unless the authorization is terminated or revoked sooner. Performed at [facility], [place] [address], [place], [place] [address]   Aerobic/Anaerobic Culture (surgical/deep wound)     Status: None (Preliminary result)   Collection Time: 11/24/19  5:51 AM   Specimen: Abdomen; Wound  Result Value Ref Range Status   Specimen Description   Final    ABDOMEN Performed at [facility], [address] [place], [place] [address]    Special Requests   Final    NONE Performed at [facility], [address] [place], [place] [address]    Gram Stain NO WBC SEEN FEW GRAM POSITIVE COCCI IN PAIRS   Final   Culture   Final    CULTURE REINCUBATED FOR BETTER GROWTH Performed at [facility], [place] [address] [place], [place] [address]    Report Status PENDING  Incomplete         Radiology Studies: CT ABDOMEN PELVIS W CONTRAST  Result Date: 11/23/2019 CLINICAL DATA:  Concern for abscess or infection on adjacent the inferior stoma site EXAM: CT ABDOMEN AND PELVIS WITH CONTRAST TECHNIQUE: Multidetector CT imaging of the abdomen and pelvis was performed using the standard protocol  following bolus administration of intravenous contrast. CONTRAST:  144mL OMNIPAQUE IOHEXOL 300 MG/ML  SOLN COMPARISON:  Radiograph 10/13/2019 FINDINGS: Lower chest: Bilateral effusions, small on the right and trace on the left, similar to slightly decreased in size from comparison study. Adjacent passive atelectasis with more bandlike areas of scarring and/or subsegmental atelectatic changes well. Normal heart size. No pericardial effusion. Few coronary artery calcifications are noted. Hepatobiliary: No focal liver abnormality is seen. Patient is post cholecystectomy. Slight prominence of the biliary tree likely related to reservoir effect. No calcified intraductal gallstones. Pancreas: Unremarkable. No pancreatic ductal dilatation or surrounding inflammatory changes. Spleen: Normal in size without focal abnormality. Adrenals/Urinary Tract: Normal adrenal glands bilateral percutaneous nephrostomy tubes are in place. Pigtail catheter tips terminate appropriately within the proximal collecting systems. Some mild urothelial thickening is noted in  both renal pelves, nonspecific. Redemonstration of the large complex centrally necrotic, enhancing mass lesion arising from the region of the bladder and extending directly through the anterior abdominal wall. There is diminished intraluminal gas but with increasing cystic components of this lesion which measures approximately 12.2 x 10.4 x 13 cm in size. Visible necked connection with the urinary bladder is noted posteriorly (2/71) with extension to the anterior midline urostomy (2/66). Surrounding phlegmonous changes noted with adjacent reactive free fluid as well. Stomach/Bowel: Distal esophagus, stomach and duodenal sweep are unremarkable. No small bowel wall thickening or dilatation. No evidence of obstruction. A normal appendix is visualized. Diverting loop colostomy noted in the mid abdomen (2/26) distal colon demonstrates some scattered colonic diverticula without  focal pericolonic inflammation the suggest diverticulitis. More diffuse segmental thickening is noted in the sigmoid colon as o'clock passes in close proximity to the heterogeneous bladder mass detailed above, the significance of which is indeterminate. Inspissated stool ball is noted within the rectum. Vascular/Lymphatic: Atherosclerotic plaque within the normal caliber aorta. Prominent retroperitoneal nodes are present though no grossly enlarged abdominopelvic adenopathy is seen. Reproductive: Postsurgical changes noted of the mildly enlarged prostate. Keyhole defect within the prostate could reflect sequela of prior TURP. Direct involvement of the prostate with the large, heterogeneous bladder mass. Other: Reactive free fluid layering in the deep pelvis, similar in extent to comparison. 4 Musculoskeletal: The osseous structures appear diffusely demineralized which may limit detection of small or nondisplaced fractures. Multilevel degenerative changes are present in the imaged portions of the spine. Bony fusion of L1-2 L3. 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 Mild canal stenosis and moderate bilateral neural foraminal narrowing at L5-S1. Severe degenerative changes noted in both hips. More moderate degenerative change noted in the SI joints and pubic symphysis. Prior right femoral intramedullary nail and transcervical fixation. IMPRESSION: 1. Redemonstration of the large complex centrally necrotic, enhancing mass arising from the region of the bladder and extending directly through the anterior abdominal wall. There is diminished intraluminal gas but with increasing cystic components of this lesion which measures approximately 12.2 x 10.4 x 13 cm in size. Visible connection with the urinary bladder is noted posteriorly with extension to the anterior midline urostomy. Surrounding phlegmonous changes noted with adjacent reactive free fluid as well. 2. Bilateral percutaneous  nephrostomy tubes are in place with mild urothelial thickening in both renal pelves, nonspecific and possibly reactive due to the presence of the stents versus secondary to urinary tract infection. 3. Bilateral pleural effusions, small on the right and trace on the left, similar to slightly decreased in size from comparison study. 4. Distal colonic thickening within the sigmoid in close proximity to the bladder mass, as detailed above. Significance of which is indeterminate. Diverting loop colostomy in place. 5. Inspissated stool ball within the rectum may represent fecal impaction. Electronically Signed   By: Lovena Le M.D.   On: 11/23/2019 18:37        Scheduled Meds: . Chlorhexidine Gluconate Cloth  6 each Topical Daily  . enoxaparin (LOVENOX) injection  40 mg Subcutaneous Q24H  . feeding supplement (ENSURE ENLIVE)  237 mL Oral BID BM  . ferrous sulfate  325 mg Oral BID WC  . loratadine  10 mg Oral Daily  . mirabegron ER  50 mg Oral Daily  . multivitamin with minerals  1 tablet Oral Daily  . potassium chloride  40 mEq Oral Q4H  . vitamin B-12  500 mcg Oral Daily   Continuous Infusions: . sodium  chloride 100 mL/hr at 11/25/19 0403  . piperacillin-tazobactam (ZOSYN)  IV 3.375 g (11/25/19 1255)  . vancomycin 1,500 mg (11/25/19 0105)     LOS: 2 days    Time spent: 30-35 minutes    Ezekiel Slocumb, DO [facility]   If 7PM-7AM, please contact night-coverage [email or web address] 11/25/2019, 1:18 PM

## 2019-11-25 NOTE — Progress Notes (Signed)
D: Pt alert and oriented x 4. Pt denies experiencing any pain at this time.   A: Scheduled medications administered to pt, per MD orders. Support and encouragement provided. Frequent verbal contact made.  R: No adverse drug reactions noted. Pt complaint with medications and treatment plan. Pt interacts well with staff on the unit. Pt is stable at this time, will continue to monitor and provide care for as ordered.

## 2019-11-26 LAB — CBC WITH DIFFERENTIAL/PLATELET
Abs Immature Granulocytes: 0.17 10*3/uL — ABNORMAL HIGH (ref 0.00–0.07)
Basophils Absolute: 0.1 10*3/uL (ref 0.0–0.1)
Basophils Relative: 1 %
Eosinophils Absolute: 0.4 10*3/uL (ref 0.0–0.5)
Eosinophils Relative: 2 %
HCT: 30.3 % — ABNORMAL LOW (ref 39.0–52.0)
Hemoglobin: 9.4 g/dL — ABNORMAL LOW (ref 13.0–17.0)
Immature Granulocytes: 1 %
Lymphocytes Relative: 8 %
Lymphs Abs: 1.9 10*3/uL (ref 0.7–4.0)
MCH: 29.5 pg (ref 26.0–34.0)
MCHC: 31 g/dL (ref 30.0–36.0)
MCV: 95 fL (ref 80.0–100.0)
Monocytes Absolute: 1.1 10*3/uL — ABNORMAL HIGH (ref 0.1–1.0)
Monocytes Relative: 5 %
Neutro Abs: 19.8 10*3/uL — ABNORMAL HIGH (ref 1.7–7.7)
Neutrophils Relative %: 83 %
Platelets: 337 10*3/uL (ref 150–400)
RBC: 3.19 MIL/uL — ABNORMAL LOW (ref 4.22–5.81)
RDW: 16.7 % — ABNORMAL HIGH (ref 11.5–15.5)
WBC: 23.5 10*3/uL — ABNORMAL HIGH (ref 4.0–10.5)
nRBC: 0 % (ref 0.0–0.2)

## 2019-11-26 LAB — BASIC METABOLIC PANEL
Anion gap: 8 (ref 5–15)
BUN: 18 mg/dL (ref 8–23)
CO2: 24 mmol/L (ref 22–32)
Calcium: 8.6 mg/dL — ABNORMAL LOW (ref 8.9–10.3)
Chloride: 110 mmol/L (ref 98–111)
Creatinine, Ser: 1.08 mg/dL (ref 0.61–1.24)
GFR calc Af Amer: >60 mL/min (ref >60)
GFR calc non Af Amer: >60 mL/min (ref >60)
Glucose, Bld: 82 mg/dL (ref 70–99)
Potassium: 4.1 mmol/L (ref 3.5–5.1)
Sodium: 142 mmol/L (ref 135–145)

## 2019-11-26 LAB — MAGNESIUM: Magnesium: 2 mg/dL (ref 1.7–2.4)

## 2019-11-26 MED ORDER — OXYCODONE HCL 5 MG PO TABS: 5.0000 mg | ORAL_TABLET | ORAL | 0 refills | Status: AC | PRN

## 2019-11-26 MED ORDER — TRAZODONE HCL 50 MG PO TABS: 25.0000 mg | ORAL_TABLET | Freq: Every evening | ORAL | 1 refills | Status: AC | PRN

## 2019-11-26 NOTE — Discharge Summary (Signed)
Physician Discharge Summary  DELFINO FRIESEN KYH:[id number] DOB: 06-18-1935 DOA: 11/23/2019  PCP: [name], MD  Admit date: 11/23/2019 Discharge date: 11/26/2019  Admitted From: Home Disposition:  Home  Recommendations for Outpatient Follow-up:  1. Follow up with PCP in 1-2 weeks 2. Please obtain BMP/CBC in one week 3. Please follow up with outpatient palliative care  Home Health: To follow up with PCP if needed.    Equipment/Devices: already has  Discharge Condition: Stable CODE STATUS: DNR  Diet recommendation: Regular   Brief/Interim Summary:  85 year old male with history of locally invasive bladder cancer,prostate cancer hypertension, iron deficiency anemia, GERD.He presented to the ED on 1/16 with lower abdominal pain, swelling and erythema and tenderness adjacent to his urostomy with urine leakage.  In the ED, tachycardic, normal. Labs showed leukocytosis 28.6 neutrophil predominant lactate was 2.3 and repeat 2.0. CT abdomen pelvis showed "redemonstration of the large complex centrally necrotic, enhancing mass arising from the region of the bladder and extending directly through the anterior abdominal wall....with increasing cystic components of this lesion which measures approximately 12.2 x 10.4 x 13 cm in size. Visible connection with the urinary bladder is noted posteriorly with extension to the anterior midline urostomy. Surrounding phlegmonous changes noted with adjacent reactive free fluid as well."And other findings as outlined above. Patient was treated with IV fluids, Vancomycin Rocephin and admitted to hospitalist service for further evaluation management.  Sepsis secondary to: Abdominal wall purulent cellulitis with associated phlegmon Sepsis present upon admission as evidenced by leukocytosis, tachycardia and cellulitis infection. Patient's bladder mass is now protruding through his anterior abdominal wall causing him significant pain.  Treatment  options exhausted from oncology standpoint. -General surgery consulted - no debredement or surgery, either would do more harm than good  --ID consulted --Stopped antibiotics, this is all cancer that nothing will prevent from worsening.  Per oncology, all treatment options exhausted.  Recommend palliative care. -Pain management including warm compresses  Essential hypertension-chronic, stable Not on medication at home.Labetalol as needed.  Locally invasive bladder cancer --Pain control --Previously had palliative care consult but is now on hospice as he requested aggressive management --Discussed with Dr. Dwyane Luo, patient's oncologist.  No further therapies to offer from oncology standpoint.   --Palliative consult for consideration of hospice/comfort care  Vitamin B12 deficiency-continue supplement  Discharge Diagnoses: Principal Problem:   Abdominal wall cellulitis Active Problems:   Sepsis ([place])   Malignant neoplasm of urinary bladder (HCC)   Essential hypertension   Vitamin B12 deficiency    Discharge Instructions   Discharge Instructions    Call MD for:  severe uncontrolled pain   Complete by: As directed    Diet - low sodium heart healthy   Complete by: As directed    Increase activity slowly   Complete by: As directed      Allergies as of 11/26/2019   No Known Allergies     Medication List    STOP taking these medications   ciprofloxacin 500 MG tablet Commonly known as: CIPRO   dexamethasone 4 MG tablet Commonly known as: DECADRON     TAKE these medications   acetaminophen 500 MG tablet Commonly known as: TYLENOL Take 1,000 mg by mouth every 6 (six) hours as needed for moderate pain or headache.   cetirizine 10 MG tablet Commonly known as: ZYRTEC Take 10 mg by mouth daily as needed for allergies.   DRY EYE RELIEF DROPS OP Apply 1 drop to eye daily as needed.   feeding supplement (ENSURE  ENLIVE) Liqd Take 237 mLs by mouth 2 (two) times daily  between meals.   ferrous sulfate 325 (65 FE) MG tablet Take 1 tablet (325 mg total) by mouth 2 (two) times daily with a meal.   lidocaine-prilocaine cream Commonly known as: EMLA Apply 1 application topically as needed.   multivitamin with minerals Tabs tablet Take 1 tablet by mouth daily.   Myrbetriq 50 MG Tb24 tablet Generic drug: mirabegron ER TAKE 1 TABLET BY MOUTH DAILY What changed: how much to take   nystatin powder Commonly known as: Nyamyc Apply topically 2 (two) times daily. What changed:   how much to take  when to take this  reasons to take this   ondansetron 8 MG tablet Commonly known as: Zofran Take 1 tablet (8 mg total) by mouth 2 (two) times daily as needed for refractory nausea / vomiting. Start on day 3 after chemo.   oxyCODONE 5 MG immediate release tablet Commonly known as: Oxy IR/ROXICODONE TAKE 1 TABLET BY MOUTH EVERY 4 HOURS AS NEEDED FOR SEVERE PAIN What changed: See the new instructions.   oxyCODONE 5 MG immediate release tablet Commonly known as: Roxicodone Take 1 tablet (5 mg total) by mouth every 4 (four) hours as needed for up to 5 days. What changed: You were already taking a medication with the same name, and this prescription was added. Make sure you understand how and when to take each.   polyethylene glycol powder 17 GM/SCOOP powder Commonly known as: MiraLax Take 17 g by mouth daily as needed. Can increase to 3 times a day as needed for constipation but hold medication if has diarrhea   prochlorperazine 10 MG tablet Commonly known as: COMPAZINE Take 1 tablet (10 mg total) by mouth every 6 (six) hours as needed (Nausea or vomiting).   Super Cranberry/Vitamin D3 4200-500 MG-UNIT Caps Generic drug: Cranberry-Cholecalciferol Take 1 capsule by mouth 2 (two) times daily.   traZODone 50 MG tablet Commonly known as: DESYREL Take 0.5 tablets (25 mg total) by mouth at bedtime as needed for sleep.   vitamin B-12 500 MCG  tablet Commonly known as: CYANOCOBALAMIN Take 500 mcg by mouth daily.   zinc oxide 20 % ointment Apply 1 application topically as needed for irritation.       No Known Allergies  Consultations:  Oncology  Infectious Disease  General Surgery   Procedures/Studies: CT ABDOMEN PELVIS W CONTRAST  Result Date: 11/23/2019 CLINICAL DATA:  Concern for abscess or infection on adjacent the inferior stoma site EXAM: CT ABDOMEN AND PELVIS WITH CONTRAST TECHNIQUE: Multidetector CT imaging of the abdomen and pelvis was performed using the standard protocol following bolus administration of intravenous contrast. CONTRAST:  176mL OMNIPAQUE IOHEXOL 300 MG/ML  SOLN COMPARISON:  Radiograph 10/13/2019 FINDINGS: Lower chest: Bilateral effusions, small on the right and trace on the left, similar to slightly decreased in size from comparison study. Adjacent passive atelectasis with more bandlike areas of scarring and/or subsegmental atelectatic changes well. Normal heart size. No pericardial effusion. Few coronary artery calcifications are noted. Hepatobiliary: No focal liver abnormality is seen. Patient is post cholecystectomy. Slight prominence of the biliary tree likely related to reservoir effect. No calcified intraductal gallstones. Pancreas: Unremarkable. No pancreatic ductal dilatation or surrounding inflammatory changes. Spleen: Normal in size without focal abnormality. Adrenals/Urinary Tract: Normal adrenal glands bilateral percutaneous nephrostomy tubes are in place. Pigtail catheter tips terminate appropriately within the proximal collecting systems. Some mild urothelial thickening is noted in both renal pelves, nonspecific. Redemonstration of the  large complex centrally necrotic, enhancing mass lesion arising from the region of the bladder and extending directly through the anterior abdominal wall. There is diminished intraluminal gas but with increasing cystic components of this lesion which measures  approximately 12.2 x 10.4 x 13 cm in size. Visible necked connection with the urinary bladder is noted posteriorly (2/71) with extension to the anterior midline urostomy (2/66). Surrounding phlegmonous changes noted with adjacent reactive free fluid as well. Stomach/Bowel: Distal esophagus, stomach and duodenal sweep are unremarkable. No small bowel wall thickening or dilatation. No evidence of obstruction. A normal appendix is visualized. Diverting loop colostomy noted in the mid abdomen (2/26) distal colon demonstrates some scattered colonic diverticula without focal pericolonic inflammation the suggest diverticulitis. More diffuse segmental thickening is noted in the sigmoid colon as o'clock passes in close proximity to the heterogeneous bladder mass detailed above, the significance of which is indeterminate. Inspissated stool ball is noted within the rectum. Vascular/Lymphatic: Atherosclerotic plaque within the normal caliber aorta. Prominent retroperitoneal nodes are present though no grossly enlarged abdominopelvic adenopathy is seen. Reproductive: Postsurgical changes noted of the mildly enlarged prostate. Keyhole defect within the prostate could reflect sequela of prior TURP. Direct involvement of the prostate with the large, heterogeneous bladder mass. Other: Reactive free fluid layering in the deep pelvis, similar in extent to comparison. 4 Musculoskeletal: The osseous structures appear diffusely demineralized which may limit detection of small or nondisplaced fractures. Multilevel degenerative changes are present in the imaged portions of the spine. Bony fusion of L1-2 L3. 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 Mild canal stenosis and moderate bilateral neural foraminal narrowing at L5-S1. Severe degenerative changes noted in both hips. More moderate degenerative change noted in the SI joints and pubic symphysis. Prior right femoral intramedullary nail and transcervical  fixation. IMPRESSION: 1. Redemonstration of the large complex centrally necrotic, enhancing mass arising from the region of the bladder and extending directly through the anterior abdominal wall. There is diminished intraluminal gas but with increasing cystic components of this lesion which measures approximately 12.2 x 10.4 x 13 cm in size. Visible connection with the urinary bladder is noted posteriorly with extension to the anterior midline urostomy. Surrounding phlegmonous changes noted with adjacent reactive free fluid as well. 2. Bilateral percutaneous nephrostomy tubes are in place with mild urothelial thickening in both renal pelves, nonspecific and possibly reactive due to the presence of the stents versus secondary to urinary tract infection. 3. Bilateral pleural effusions, small on the right and trace on the left, similar to slightly decreased in size from comparison study. 4. Distal colonic thickening within the sigmoid in close proximity to the bladder mass, as detailed above. Significance of which is indeterminate. Diverting loop colostomy in place. 5. Inspissated stool ball within the rectum may represent fecal impaction. Electronically Signed   By: Lovena Le M.D.   On: 11/23/2019 18:37   IR NEPHROSTOMY EXCHANGE LEFT  Result Date: 11/22/2019 INDICATION: History of bladder cancer with chronic bilateral percutaneous nephrostomy catheters. Patient presents today for routine fluoroscopic guided exchange. EXAM: FLUOROSCOPIC GUIDED BILATERAL SIDED NEPHROSTOMY CATHETER EXCHANGE COMPARISON:  Multiple previous fluoroscopic guided nephrostomy catheter exchanges, most recently on 09/13/2019; CT abdomen pelvis-10/13/2019 CONTRAST:  A total of 20 mL Isovue-300 administered was administered into both collecting systems FLUOROSCOPY TIME:  1 minute, 24 seconds (23 mGy) COMPLICATIONS: None immediate. TECHNIQUE: Informed written consent was obtained from the patient after a discussion of the risks, benefits and  alternatives to treatment. Questions regarding the procedure were encouraged and  answered. A timeout was performed prior to the initiation of the procedure. The bilateral flanks and external portions of existing nephrostomy catheters were prepped and draped in the usual sterile fashion. A sterile drape was applied covering the operative field. Maximum barrier sterile technique with sterile gowns and gloves were used for the procedure. A timeout was performed prior to the initiation of the procedure. A pre procedural spot fluoroscopic image was obtained. Beginning with the left-sided nephrostomy, a small amount of contrast was injected via the existing left-sided nephrostomy catheter demonstrating appropriate positioning within the renal pelvis. The existing nephrostomy catheter was cut and cannulated with a Benson wire which was coiled within the renal pelvis. Under intermittent fluoroscopic guidance, the existing nephrostomy catheter was exchanged for a new 12 [place] all-purpose drainage catheter. Limited contrast injection confirmed appropriate positioning within the left renal pelvis and a post exchange fluoroscopic image was obtained. The catheter was locked, secured to the skin with an interrupted suture and reconnected to a gravity bag. The identical repeat procedure was repeated for the contralateral right-sided nephrostomy, ultimately allowing successful exchange of a new 10.2 [place] all-purpose drainage catheter with end coiled and locked within the right renal pelvis. Dressings were placed. The patient tolerated the above procedures well without immediate postprocedural complication. FINDINGS: The existing nephrostomy catheters are appropriately positioned and functioning. After successful fluoroscopic guided exchange, new bilateral 12 [place] nephrostomy catheters are coiled and locked within the respective renal pelvises. IMPRESSION: Successful fluoroscopic guided exchange of bilateral 12 [place]  percutaneous nephrostomy catheters. Electronically Signed   By: Sandi Mariscal M.D.   On: 11/22/2019 10:54   IR NEPHROSTOMY EXCHANGE RIGHT  Result Date: 11/22/2019 INDICATION: History of bladder cancer with chronic bilateral percutaneous nephrostomy catheters. Patient presents today for routine fluoroscopic guided exchange. EXAM: FLUOROSCOPIC GUIDED BILATERAL SIDED NEPHROSTOMY CATHETER EXCHANGE COMPARISON:  Multiple previous fluoroscopic guided nephrostomy catheter exchanges, most recently on 09/13/2019; CT abdomen pelvis-10/13/2019 CONTRAST:  A total of 20 mL Isovue-300 administered was administered into both collecting systems FLUOROSCOPY TIME:  1 minute, 24 seconds (23 mGy) COMPLICATIONS: None immediate. TECHNIQUE: Informed written consent was obtained from the patient after a discussion of the risks, benefits and alternatives to treatment. Questions regarding the procedure were encouraged and answered. A timeout was performed prior to the initiation of the procedure. The bilateral flanks and external portions of existing nephrostomy catheters were prepped and draped in the usual sterile fashion. A sterile drape was applied covering the operative field. Maximum barrier sterile technique with sterile gowns and gloves were used for the procedure. A timeout was performed prior to the initiation of the procedure. A pre procedural spot fluoroscopic image was obtained. Beginning with the left-sided nephrostomy, a small amount of contrast was injected via the existing left-sided nephrostomy catheter demonstrating appropriate positioning within the renal pelvis. The existing nephrostomy catheter was cut and cannulated with a Benson wire which was coiled within the renal pelvis. Under intermittent fluoroscopic guidance, the existing nephrostomy catheter was exchanged for a new 12 [place] all-purpose drainage catheter. Limited contrast injection confirmed appropriate positioning within the left renal pelvis and a post  exchange fluoroscopic image was obtained. The catheter was locked, secured to the skin with an interrupted suture and reconnected to a gravity bag. The identical repeat procedure was repeated for the contralateral right-sided nephrostomy, ultimately allowing successful exchange of a new 10.2 [place] all-purpose drainage catheter with end coiled and locked within the right renal pelvis. Dressings were placed. The patient tolerated the above procedures well without  immediate postprocedural complication. FINDINGS: The existing nephrostomy catheters are appropriately positioned and functioning. After successful fluoroscopic guided exchange, new bilateral 12 French nephrostomy catheters are coiled and locked within the respective renal pelvises. IMPRESSION: Successful fluoroscopic guided exchange of bilateral 12 French percutaneous nephrostomy catheters. Electronically Signed   By: Sandi Mariscal M.D.   On: 11/22/2019 10:54        Subjective: Patient reports feels okay this AM.  No acute events reported.  He denies fever/chills, nausea/vomiting, chest pain or SOB.  Some abdominal wall pain from changing urostomy bag.     Discharge Exam: Vitals:   11/26/19 0025 11/26/19 0810  BP: (!) 102/57 107/60  Pulse: 75 88  Resp: 18 16  Temp: 97.6 F (36.4 C) (!) 97.4 F (36.3 C)  SpO2: 100% 98%   Vitals:   11/25/19 0903 11/25/19 1519 11/26/19 0025 11/26/19 0810  BP: (!) 107/56 102/63 (!) 102/57 107/60  Pulse: 94 88 75 88  Resp:  18 18 16   Temp:  97.8 F (36.6 C) 97.6 F (36.4 C) (!) 97.4 F (36.3 C)  TempSrc:  Oral Oral Oral  SpO2: 98% 99% 100% 98%  Weight:      Height:        General: Pt is alert, awake, not in acute distress Cardiovascular: RRR, S1/S2 +, no rubs, no gallops Respiratory: CTA bilaterally, no wheezing, no rhonchi Abdominal: Soft, NT, ND, bowel sounds +, colostomy, urostomy present, bilateral nephrostomies Extremities: no edema, no cyanosis    The results of significant  diagnostics from this hospitalization (including imaging, microbiology, ancillary and laboratory) are listed below for reference.     Microbiology: Recent Results (from the past 240 hour(s))  Culture, blood (routine x 2)     Status: None (Preliminary result)   Collection Time: 11/23/19  5:12 PM   Specimen: BLOOD  Result Value Ref Range Status   Specimen Description BLOOD LEFT ANTECUBITAL  Final   Special Requests   Final    BOTTLES DRAWN AEROBIC AND ANAEROBIC Blood Culture results may not be optimal due to an excessive volume of blood received in culture bottles   Culture   Final    NO GROWTH 3 DAYS Performed at [facility], [address] [place], [place] [address]    Report Status PENDING  Incomplete  Culture, blood (routine x 2)     Status: Abnormal   Collection Time: 11/23/19  5:12 PM   Specimen: BLOOD LEFT HAND  Result Value Ref Range Status   Specimen Description BLOOD LEFT HAND  Final   Special Requests   Final    BOTTLES DRAWN AEROBIC AND ANAEROBIC Blood Culture adequate volume Performed at [facility], [address] [place], [place] [address]    Culture  Setup Time   Final    GRAM POSITIVE COCCI AEROBIC BOTTLE ONLY CRITICAL RESULT CALLED TO, READ BACK BY AND VERIFIED WITH: JODY BAREFOOT ON 11/24/2019 AT 1234 TIK Performed at [facility], [address] [place], [place] [address]    Culture (A)  Final    STAPHYLOCOCCUS SPECIES (COAGULASE NEGATIVE) THE SIGNIFICANCE OF ISOLATING THIS ORGANISM FROM A SINGLE SET OF BLOOD CULTURES WHEN MULTIPLE SETS ARE DRAWN IS UNCERTAIN. PLEASE NOTIFY THE MICROBIOLOGY DEPARTMENT WITHIN ONE WEEK IF SPECIATION AND SENSITIVITIES ARE REQUIRED. Performed at [facility], [place] [address], [place], [place] [address]    Report Status 11/25/2019 FINAL  Final  SARS CORONAVIRUS 2 (TAT 6-24 HRS) Nasopharyngeal Nasopharyngeal Swab  Status: None   Collection Time: 11/24/19 12:38 AM   Specimen:  Nasopharyngeal Swab  Result Value Ref Range Status   SARS Coronavirus 2 NEGATIVE NEGATIVE Final    Comment: (NOTE) SARS-CoV-2 target nucleic acids are NOT DETECTED. The SARS-CoV-2 RNA is generally detectable in upper and lower respiratory specimens during the acute phase of infection. Negative results do not preclude SARS-CoV-2 infection, do not rule out co-infections with other pathogens, and should not be used as the sole basis for treatment or other patient management decisions. Negative results must be combined with clinical observations, patient history, and epidemiological information. The expected result is Negative. Fact Sheet for Patients: SugarRoll.be Fact Sheet for Healthcare Providers: https://www.woods-mathews.com/ This test is not yet approved or cleared by the [place] FDA and  has been authorized for detection and/or diagnosis of SARS-CoV-2 by FDA under an Emergency Use Authorization (EUA). This EUA will remain  in effect (meaning this test can be used) for the duration of the COVID-19 declaration under Section 56 4(b)(1) of the Act, 21 U.S.C. section 360bbb-3(b)(1), unless the authorization is terminated or revoked sooner. Performed at [facility], [place] [address], [place], [place] [address]   Aerobic/Anaerobic Culture (surgical/deep wound)     Status: None (Preliminary result)   Collection Time: 11/24/19  5:51 AM   Specimen: Abdomen; Wound  Result Value Ref Range Status   Specimen Description   Final    ABDOMEN Performed at [facility], [address] [place], [place] [address]    Special Requests   Final    NONE Performed at [facility], [address] [place], [place] [address]    Gram Stain   Final    NO WBC SEEN FEW GRAM POSITIVE COCCI IN PAIRS Performed at [facility], [place] [address], [place],  [address]    Culture FEW STAPHYLOCOCCUS EPIDERMIDIS  Final   Report  Status PENDING  Incomplete     Labs: BNP (last 3 results) No results for input(s): BNP in the last 8760 hours. Basic Metabolic Panel: Recent Labs  Lab 11/23/19 1337 11/24/19 0551 11/25/19 0638 11/26/19 0503  NA 136 139 140 142  K 3.7 3.5 3.3* 4.1  CL 98 105 107 110  CO2 28 27 23 24   GLUCOSE 143* 115* 89 82  BUN 22 20 19 18   CREATININE 1.01 1.00 1.08 1.08  CALCIUM 9.4 8.8* 8.6* 8.6*  MG  --   --  1.7 2.0   Liver Function Tests: Recent Labs  Lab 11/23/19 1337 11/25/19 0638  AST 15 12*  ALT 12 9  ALKPHOS 88 76  BILITOT 0.5 0.7  PROT 7.3 5.6*  ALBUMIN 3.1* 2.2*   No results for input(s): LIPASE, AMYLASE in the last 168 hours. No results for input(s): AMMONIA in the last 168 hours. CBC: Recent Labs  Lab 11/23/19 1337 11/24/19 0551 11/25/19 0638 11/26/19 0503  WBC 28.6* 27.4* 28.5* 23.5*  NEUTROABS 24.3*  --  24.6* 19.8*  HGB 12.2* 9.8* 9.7* 9.4*  HCT 39.0 31.2* 31.5* 30.3*  MCV 93.3 94.8 95.5 95.0  PLT 469* 330 342 337   Cardiac Enzymes: No results for input(s): CKTOTAL, CKMB, CKMBINDEX, TROPONINI in the last 168 hours. BNP: Invalid input(s): POCBNP CBG: No results for input(s): GLUCAP in the last 168 hours. D-Dimer No results for input(s): DDIMER in the last 72 hours. Hgb A1c No results for input(s): HGBA1C in the last 72 hours. Lipid Profile No results for input(s): CHOL, HDL, LDLCALC,  TRIG, CHOLHDL, LDLDIRECT in the last 72 hours. Thyroid function studies No results for input(s): TSH, T4TOTAL, T3FREE, THYROIDAB in the last 72 hours.  Invalid input(s): FREET3 Anemia work up No results for input(s): VITAMINB12, FOLATE, FERRITIN, TIBC, IRON, RETICCTPCT in the last 72 hours. Urinalysis    Component Value Date/Time   COLORURINE YELLOW (A) 11/24/2019 0038   APPEARANCEUR HAZY (A) 11/24/2019 0038   APPEARANCEUR Cloudy (A) 10/23/2018 1327   LABSPEC >1.046 (H) 11/24/2019 0038   PHURINE 5.0 11/24/2019 0038   GLUCOSEU NEGATIVE 11/24/2019 0038   HGBUR  SMALL (A) 11/24/2019 0038   BILIRUBINUR NEGATIVE 11/24/2019 0038   BILIRUBINUR Negative 10/23/2018 1327   KETONESUR NEGATIVE 11/24/2019 0038   PROTEINUR 30 (A) 11/24/2019 0038   NITRITE NEGATIVE 11/24/2019 0038   LEUKOCYTESUR SMALL (A) 11/24/2019 0038   Sepsis Labs Invalid input(s): PROCALCITONIN,  WBC,  LACTICIDVEN Microbiology Recent Results (from the past 240 hour(s))  Culture, blood (routine x 2)     Status: None (Preliminary result)   Collection Time: 11/23/19  5:12 PM   Specimen: BLOOD  Result Value Ref Range Status   Specimen Description BLOOD LEFT ANTECUBITAL  Final   Special Requests   Final    BOTTLES DRAWN AEROBIC AND ANAEROBIC Blood Culture results may not be optimal due to an excessive volume of blood received in culture bottles   Culture   Final    NO GROWTH 3 DAYS Performed at [facility], [address], [place], [place] [address]    Report Status PENDING  Incomplete  Culture, blood (routine x 2)     Status: Abnormal   Collection Time: 11/23/19  5:12 PM   Specimen: BLOOD LEFT HAND  Result Value Ref Range Status   Specimen Description BLOOD LEFT HAND  Final   Special Requests   Final    BOTTLES DRAWN AEROBIC AND ANAEROBIC Blood Culture adequate volume Performed at [facility], [address] [place], [place] [address]    Culture  Setup Time   Final    GRAM POSITIVE COCCI AEROBIC BOTTLE ONLY CRITICAL RESULT CALLED TO, READ BACK BY AND VERIFIED WITH: JODY BAREFOOT ON 11/24/2019 AT 1234 TIK Performed at [facility], [address] [place], [place] [address]    Culture (A)  Final    STAPHYLOCOCCUS SPECIES (COAGULASE NEGATIVE) THE SIGNIFICANCE OF ISOLATING THIS ORGANISM FROM A SINGLE SET OF BLOOD CULTURES WHEN MULTIPLE SETS ARE DRAWN IS UNCERTAIN. PLEASE NOTIFY THE MICROBIOLOGY DEPARTMENT WITHIN ONE WEEK IF SPECIATION AND SENSITIVITIES ARE REQUIRED. Performed at [facility], [place] [address] [place], [place] [address]     Report Status 11/25/2019 FINAL  Final  SARS CORONAVIRUS 2 (TAT 6-24 HRS) Nasopharyngeal Nasopharyngeal Swab     Status: None   Collection Time: 11/24/19 12:38 AM   Specimen: Nasopharyngeal Swab  Result Value Ref Range Status   SARS Coronavirus 2 NEGATIVE NEGATIVE Final    Comment: (NOTE) SARS-CoV-2 target nucleic acids are NOT DETECTED. The SARS-CoV-2 RNA is generally detectable in upper and lower respiratory specimens during the acute phase of infection. Negative results do not preclude SARS-CoV-2 infection, do not rule out co-infections with other pathogens, and should not be used as the sole basis for treatment or other patient management decisions. Negative results must be combined with clinical observations, patient history, and epidemiological information. The expected result is Negative. Fact Sheet for Patients: SugarRoll.be Fact Sheet for Healthcare Providers: https://www.woods-mathews.com/ This test is not yet approved or cleared by the [place]  FDA and  has been authorized for detection and/or diagnosis of SARS-CoV-2 by FDA under an Emergency Use Authorization (EUA). This EUA will remain  in effect (meaning this test can be used) for the duration of the COVID-19 declaration under Section 56 4(b)(1) of the Act, 21 U.S.C. section 360bbb-3(b)(1), unless the authorization is terminated or revoked sooner. Performed at [facility], [place] [address] [place], [place] [address]   Aerobic/Anaerobic Culture (surgical/deep wound)     Status: None (Preliminary result)   Collection Time: 11/24/19  5:51 AM   Specimen: Abdomen; Wound  Result Value Ref Range Status   Specimen Description   Final    ABDOMEN Performed at [facility], [address], [place], [place] [address]    Special Requests   Final    NONE Performed at [facility], [address] [place], [place] [address]    Gram Stain   Final    NO WBC  SEEN FEW GRAM POSITIVE COCCI IN PAIRS Performed at [facility], [place] [address] [place], [place] [address]    Culture FEW STAPHYLOCOCCUS EPIDERMIDIS  Final   Report Status PENDING  Incomplete     Time coordinating discharge: Over 30 minutes  SIGNED:   Ezekiel Slocumb, DO [facility] 11/26/2019, 8:57 AM   If 7PM-7AM, please contact night-coverage [email or web address]

## 2019-11-26 NOTE — Progress Notes (Signed)
Patient discharged home per MD order. Discharge instructions given to patient and wife over phone. EMS called for transport

## 2019-11-26 NOTE — TOC Initial Note (Addendum)
Transition of Care [facility]) - Initial/Assessment Note    Patient Details  Name: Vincent Poole MRN: [id number] Date of Birth: 12-30-1934  Transition of [facility]) CM/SW Contact:    Candie Chroman, LCSW Phone Number: 11/26/2019, 10:25 AM  Clinical Narrative: CSW met with patient. No supports at bedside. Patient very hard of hearing and gave permission for CSW to call his wife to facilitate discharge. He did confirm he is currently staying at his granddaughters house. CSW called wife, introduced role, and explained that discharge planning would be discussed. Patient's wife expressed frustrations from last discharge and not getting updates this admission since he was moving from the ED. CSW provided support and validated concerns. Patient's wife said no one ever followed up for home health services at discharge. According to discharge notes from last admission, patient was set up with [facility]. CSW called their representative who stated that he was never opened because they refused care in favor of personal care services. They had sent three clinicians out before but were unable to start services. CSW left message for [facility] liaison to see if there was any update with home health services on their end. Wife confirmed [facility] NP had started outpatient palliative services. Asked RN to call wife with update and to determine if she could pick him up today or if he needed EMS to take him home. CSW confirmed granddaughter's address with wife: [address] [name]. Patient has all equipment he needs at home. Wife stated at last discharge she did not receive education on ostomy care. CSW asked RN to address this as well.        11:59 am: Per [facility] liaison, notes do not show that they have been working on setting patient up with a home health agency. [facility] is unable to try and restart services. [facility], [facility] are unable to accept referral. [facility] is not in network with  his insurance. Left messages for [facility], [facility], and [facility]. Per RN, plan for patient to transport back to his granddaughter's house by EMS. EMS paperwork complete and DNR signed by MD.         12:43 pm: [facility] and [facility] are unable to accept referral. [facility] checking to see if they can take him.  Expected Discharge Plan: Opp Barriers to Discharge: Barriers Resolved   Patient Goals and CMS Choice        Expected Discharge Plan and Services Expected Discharge Plan: [place] Choice: [place] arrangements for the past 2 months: Single Family Home Expected Discharge Date: 11/26/19                                    Prior Living Arrangements/Services Living arrangements for the past 2 months: Single Family Home Lives with:: Relatives, Spouse Patient language and need for interpreter reviewed:: Yes Do you feel safe going back to the place where you live?: Yes      Need for Family Participation in Patient Care: Yes (Comment) Care giver support system in place?: Yes (comment) Current home services: DME, Other (comment)(Outpatient palliative.) Criminal Activity/Legal Involvement Pertinent to Current Situation/Hospitalization: No - Comment as needed  Activities of Daily Living Home Assistive Devices/Equipment: Hospital bed, Hearing aid, Walker (specify type), Dentures (specify type)(front wheel walker) ADL Screening (condition at time of admission) Patient's cognitive ability adequate to safely  complete daily activities?: Yes Is the patient deaf or have difficulty hearing?: Yes Does the patient have difficulty seeing, even when wearing glasses/contacts?: No Does the patient have difficulty concentrating, remembering, or making decisions?: No Patient able to express need for assistance with ADLs?: Yes Does the patient have difficulty dressing or bathing?: Yes Independently performs ADLs?:  No Communication: Independent Dressing (OT): Needs assistance Is this a change from baseline?: Pre-admission baseline Grooming: Needs assistance Is this a change from baseline?: Pre-admission baseline Feeding: Independent Bathing: Needs assistance Is this a change from baseline?: Pre-admission baseline Toileting: Needs assistance Is this a change from baseline?: Pre-admission baseline In/Out Bed: Needs assistance Is this a change from baseline?: Pre-admission baseline Walks in Home: Needs assistance Is this a change from baseline?: Pre-admission baseline Does the patient have difficulty walking or climbing stairs?: Yes Weakness of Legs: Both Weakness of Arms/Hands: Both  Permission Sought/Granted Permission sought to share information with : Family Supports Permission granted to share information with : Yes, Verbal Permission Granted  Share Information with NAME: Nikki Rusnak     Permission granted to share info w Relationship: Wife  Permission granted to share info w Contact Information: [phone]  Emotional Assessment Appearance:: Appears stated age Attitude/Demeanor/Rapport: Engaged, Gracious Affect (typically observed): Accepting, Appropriate, Calm, Pleasant Orientation: : Oriented to Self, Oriented to Place, Oriented to  Time, Oriented to Situation Alcohol / Substance Use: Not Applicable Psych Involvement: No (comment)  Admission diagnosis:  Cellulitis of abdominal wall [L03.311] Abdominal wall cellulitis [L03.311] Patient Active Problem List   Diagnosis Date Noted  . Abdominal wall cellulitis 11/23/2019  . Intra-abdominal abscess ([place])   . Intra-abdominal infection 10/05/2019  . S/P partial resection of colon 07/17/2019  . Protein-calorie malnutrition, severe 06/11/2019  . Abdominal pain, generalized   . Palliative care by specialist   . Urothelial cancer ([place])   . Weakness   . Large bowel obstruction ([place]) 06/06/2019  . Hydronephrosis, right 04/08/2019  .  Pressure injury of skin 03/23/2019  . SIRS (systemic inflammatory response syndrome) ([place]) 03/22/2019  . Leukocytosis 02/21/2019  . Attention to nephrostomy ([place]) 11/22/2018  . Complicated UTI (urinary tract infection) 11/14/2018  . Palliative care encounter   . Iron deficiency anemia secondary to inadequate dietary iron intake 06/27/2018  . Vitamin B12 deficiency 06/27/2018  . History of fracture of right hip 06/26/2018  . History of normocytic normochromic anemia 06/26/2018  . Personal history of bladder cancer 06/26/2018  . Hip fracture ([place]) 05/01/2018  . Malignant neoplasm of urinary bladder ([place]) 01/12/2018  . Goals of care, counseling/discussion 01/12/2018  . History of recurrent UTIs 08/03/2017  . Pyelonephritis 05/31/2017  . UTI (urinary tract infection) 03/04/2017  . Urinary obstruction   . Essential hypertension 08/30/2016  . History of shingles 08/30/2016  . Prostate cancer ([place]) 08/30/2016  . Urinary retention 08/30/2016  . Medicare annual wellness visit, initial 08/01/2016  . Medicare annual wellness visit, subsequent 08/01/2016  . Sepsis ([place]) 07/11/2016  . Borderline diabetes mellitus 01/26/2016  . Vaccine counseling 01/26/2015  . Lumbar radiculitis 06/24/2014  . OA (osteoarthritis) of hip 06/24/2014  . Malignant neoplasm of prostate ([place]) 01/27/2011   PCP:  [name], MD Pharmacy:   [facility], [address] [place] [address] Phone: [phone] Fax: [phone]  [place], [place] [address] Phone: [phone] Fax: [phone]     Social Determinants of Health (SDOH) Interventions  Readmission Risk Interventions Readmission Risk Prevention Plan 11/26/2019 10/29/2019 10/10/2019  Transportation Screening Complete Complete Complete  PCP or Specialist Appt within 3-5 Days - - -  [place] or [place] for [place] - - -  Medication Review Press photographer) - Complete Complete  PCP or Specialist appointment within 3-5 days of discharge Complete Complete -  [place] or [place] - Complete Patient refused  SW Recovery Care/Counseling Consult Complete - Complete  Palliative Care Screening Complete Patient Refused -  [place] Not Applicable Not Applicable -  Some recent data might be hidden

## 2019-11-26 NOTE — TOC Transition Note (Signed)
Transition of Care [facility]) - CM/SW Discharge Note   Patient Details  Name: Vincent Poole DOBIE MRN: [id number] Date of Birth: 06-09-1935  [facility]) CM/SW Contact:  Candie Chroman, LCSW Phone Number: 11/26/2019, 1:20 PM   Clinical Narrative: [facility] also unable to accept referral. Called patient's wife to make her aware. Encouraged her to call his PCP in a week or two to see if staffing is any better if she feels he needs a nurse at that point. Patient's wife expressed understanding. [facility] liaison is aware of discharge for today and will let their team know. No further concerns. CSW signing off.    Final next level of care: Home/Self Care Barriers to Discharge: Barriers Resolved   Patient Goals and CMS Choice     Choice offered to / list presented to : NA  Discharge Placement                Patient to be transferred to facility by: EMS Name of family member notified: Naman Spychalski Patient and family notified of of transfer: 11/26/19  Discharge Plan and Services     Post Acute Care Choice: Home Health                               Social Determinants of Health (SDOH) Interventions     Readmission Risk Interventions Readmission Risk Prevention Plan 11/26/2019 10/29/2019 10/10/2019  Transportation Screening Complete Complete Complete  PCP or Specialist Appt within 3-5 Days - - -  [facility] or [place] for [place] - - -  Medication Review Press photographer) - Complete Complete  PCP or Specialist appointment within 3-5 days of discharge Complete Complete -  [place] or [place] - Complete Patient refused  SW Recovery Care/Counseling Consult Complete - Complete  Palliative Care Screening Complete Patient Refused -  [place] Not Applicable Not Applicable -  Some recent data might be hidden

## 2019-11-26 NOTE — Care Management Important Message (Signed)
Important Message  Patient Details  Name: Vincent Poole MRN: [id number] Date of Birth: 07-27-1935   Medicare Important Message Given:  Yes     Juliann Pulse A Jarquez Mestre 11/26/2019, 10:55 AM

## 2019-11-26 NOTE — Progress Notes (Signed)
EMS called for transport. 4th on list

## 2019-11-27 NOTE — Telephone Encounter (Signed)
Erroneous encounter

## 2019-11-28 LAB — CULTURE, BLOOD (ROUTINE X 2): Culture: NO GROWTH

## 2019-11-29 LAB — AEROBIC/ANAEROBIC CULTURE W GRAM STAIN (SURGICAL/DEEP WOUND): Gram Stain: NONE SEEN

## 2019-12-02 ENCOUNTER — Ambulatory Visit: Admit: 2019-12-02 | Payer: Medicare Other | Admitting: Radiation Oncology

## 2019-12-02 ENCOUNTER — Encounter: Payer: Self-pay | Admitting: Oncology

## 2019-12-02 ENCOUNTER — Inpatient Hospital Stay (HOSPITAL_BASED_OUTPATIENT_CLINIC_OR_DEPARTMENT_OTHER): Payer: Medicare Other | Admitting: Oncology

## 2019-12-02 ENCOUNTER — Inpatient Hospital Stay: Payer: Medicare Other

## 2019-12-02 ENCOUNTER — Other Ambulatory Visit: Payer: Self-pay

## 2019-12-02 DIAGNOSIS — C689 Malignant neoplasm of urinary organ, unspecified: Secondary | ICD-10-CM

## 2019-12-02 DIAGNOSIS — Z7189 Other specified counseling: Secondary | ICD-10-CM

## 2019-12-02 NOTE — Progress Notes (Signed)
Patient stated that he had been doing well with no concerns. 

## 2019-12-03 ENCOUNTER — Other Ambulatory Visit: Payer: Self-pay | Admitting: Urology

## 2019-12-03 NOTE — Progress Notes (Signed)
I connected with Vincent Poole on 12/03/19 at 11:15 AM EST by video enabled telemedicine visit and verified that I am speaking with the correct person using two identifiers.   I discussed the limitations, risks, security and privacy concerns of performing an evaluation and management service by telemedicine and the availability of in-person appointments. I also discussed with the patient that there may be a patient responsible charge related to this service. The patient expressed understanding and agreed to proceed.  Other persons participating in the visit and their role in the encounter:  Patients wife ruth  Patient's location:  home Provider's location:  work  Risk analyst Complaint: Assess ongoing goals of care with history of locally advanced metastatic bladder cancer  History of present illness: patient is a 85 year old male with a past medical history significant for prostate cancer, recurrent UTIs and urinary retention. For his prostate cancer he has received IM RT in the past. He has been seeing Dr. Erlene Quan in the past for his recurrent UTIs as well as hematuria. CT scan in July 2018 showed bladder wall thickening with perivascular edema and inflammation in the right ureter greater than the left. Findings were thought to be due to pyelonephritis at that time. He underwent cystoscopy on 12/14/2017 which showed abnormal looking prostate with necrotic material lining the entire surface area. Diffuse copious debris within the bladder appearing to be erythematous without discrete bladder tumor but visualization was poor. He was then admitted to the hospital on 12/26/2017 with symptoms of UTI and sepsis as well as new moderate bilateral hydronephrosis.  CT abdomen and pelvis with contrast on 12/20/2017 again showed market bile bladder wall thickening with perivesicular edema. Within the lumen of the bladder there is a 93.9 cm soft tissue attenuating filling defect. This is indeterminate and could  represent an area of blood clot versus urothelial lesion.  He underwent repeat cystoscopy with bilateral pyelogram and ureteral stent placement as well as TURBT and TURP. He was found to have a massive tumor involving the majority of the bladder with grossly necrotic and calcified material. There appeared to be multifocal disease with large burden of the left anterior bladder wall extending posteriorly as well as adjacent to the bladder neck and beyond the right hemitrigone. Very little normal recognizable bladder mucosa remaining.   Biopsy from TURBT and TURP showed: High-grade urothelial carcinoma with extensive necrosisinvolving both the bladder and the prostate.CT chest did not reveal any evidence of metastatic disease. He was also not found to have any regional adenopathy on CT abdomen  Dr. Erlene Quan performed interval TURBT on 01/15/18 and was able to debulk tumor as much as possible to reduce tumor burden  Patient received 5 cycles ofcarboplatin/ gemcitabine 2 weeks on and 1 week off ending 04/26/18.Patient did receive radiation for 10 days during cycle 2 of treatment. Given patients age, co-morbidities and frailty- he was not a cisplatin candidate.6th cycle not given due to fall and hip fracture  Patient had a repeat cystoscopy in September 2019 which showed no obvious tumor bladder but it did have a shaggy necrotic appearance at the bladder neck. Prostatic fossa was grossly abnormal necrotic and irregular without papillary change. Bladder neck was biopsied and showed residual high-grade urothelial carcinoma. Muscularis propria was not seen in that specimen.Due to evidence of recurrent/residual disease patient was started on Tecentriq on 07/26/2018  Patient noted to have local progression of his bladder cancer causing bowel obstruction in July 2020. He is status post diverting colostomy. He also has bilateral nephrostomies  in place.FGFR mutation testing was negative.  Third line padcevstarted.  However there is continued growth of tumor and protrusion through the anterior abdominal wall.  No further systemic therapy planned at this time  Interval history: Patient has been living at his granddaughter's place along with his wife [name].  Patient is extremely hard of hearing and most of the history is obtained from the patient's wife.  She reports that overall patient has been doing well since his hospital discharge.  For the last 2 to 3 days he has not required any pain meds and his anterior abdominal wall wound is stable.  She has been getting home health as well.  Patient is able to get himself up and sit into a wheelchair.  He has been eating and drinking well.   Review of Systems  Constitutional: Positive for malaise/fatigue. Negative for chills, fever and weight loss.  HENT: Negative for congestion, ear discharge and nosebleeds.   Eyes: Negative for blurred vision.  Respiratory: Negative for cough, hemoptysis, sputum production, shortness of breath and wheezing.   Cardiovascular: Negative for chest pain, palpitations, orthopnea and claudication.  Gastrointestinal: Positive for abdominal pain. Negative for blood in stool, constipation, diarrhea, heartburn, melena, nausea and vomiting.  Genitourinary: Negative for dysuria, flank pain, frequency, hematuria and urgency.  Musculoskeletal: Negative for back pain, joint pain and myalgias.  Skin: Negative for rash.  Neurological: Negative for dizziness, tingling, focal weakness, seizures, weakness and headaches.  Endo/Heme/Allergies: Does not bruise/bleed easily.  Psychiatric/Behavioral: Negative for depression and suicidal ideas. The patient does not have insomnia.     No Known Allergies  Past Medical History:  Diagnosis Date  . Anxiety   . Arthritis   . Bladder cancer ([place])   . Dysrhythmia 07/2018   history of atrial flutter that worsens with anxiety  . Femur fracture, right ([place]) 05/01/2018  . GERD  (gastroesophageal reflux disease)   . History of recent blood transfusion 05/2018  . Hypertension   . Iron deficiency anemia 09/14/2018  . Prostate cancer ([place]) 07/2018   cancer growing in prostate but not prostate cancer, it is from the bladder  . Umbilical hernia [id number]  . Urinary retention 2019   foley catheter place 11/2017  . UTI (urinary tract infection) 2019   frequent UTI's over last year  . Wound eschar of foot 07/2018   left heal getting wrapped and requiring antibiotic cream. cracks open with weight bearing.    Past Surgical History:  Procedure Laterality Date  . CARPAL TUNNEL RELEASE Right   . CHOLECYSTECTOMY  2004  . COLOSTOMY N/A 06/08/2019   Procedure: COLOSTOMY;  Surgeon: Herbert Pun, MD;  Location: [facility];  Service: General;  Laterality: N/A;  . CYSTOGRAM  07/18/2018   Procedure: CYSTOGRAM;  Surgeon: [name], MD;  Location: [facility];  Service: Urology;;  . Consuela Mimes W/ RETROGRADES Bilateral 07/18/2018   Procedure: CYSTOSCOPY WITH RETROGRADE PYELOGRAM;  Surgeon: [name], MD;  Location: [facility];  Service: Urology;  Laterality: Bilateral;  . CYSTOSCOPY W/ URETERAL STENT PLACEMENT Bilateral 12/27/2017   Procedure: CYSTOSCOPY WITH RETROGRADE PYELOGRAM/URETERAL STENT PLACEMENT;  Surgeon: [name], MD;  Location: [facility];  Service: Urology;  Laterality: Bilateral;  . CYSTOSCOPY W/ URETERAL STENT PLACEMENT Bilateral 07/18/2018   Procedure: CYSTOSCOPY WITH STENT REPLACEMENT (exchange);  Surgeon: [name], MD;  Location: [facility];  Service: Urology;  Laterality: Bilateral;  . CYSTOSCOPY WITH STENT PLACEMENT Bilateral 11/12/2018   Procedure: [place] WITH STENT Exchange;  Surgeon: [name], MD;  Location: [facility]  ORS;  Service: Urology;  Laterality: Bilateral;  . INTRAMEDULLARY (IM) NAIL INTERTROCHANTERIC Right 05/02/2018   Procedure: INTRAMEDULLARY (IM) NAIL INTERTROCHANTRIC;  Surgeon: [name], MD;  Location: [facility];  Service:  Orthopedics;  Laterality: Right;  . IR NEPHROSTOMY EXCHANGE LEFT  03/25/2019  . IR NEPHROSTOMY EXCHANGE LEFT  04/19/2019  . IR NEPHROSTOMY EXCHANGE LEFT  05/31/2019  . IR NEPHROSTOMY EXCHANGE LEFT  07/19/2019  . IR NEPHROSTOMY EXCHANGE LEFT  09/13/2019  . IR NEPHROSTOMY EXCHANGE LEFT  11/22/2019  . IR NEPHROSTOMY EXCHANGE RIGHT  03/25/2019  . IR NEPHROSTOMY EXCHANGE RIGHT  04/19/2019  . IR NEPHROSTOMY EXCHANGE RIGHT  05/31/2019  . IR NEPHROSTOMY EXCHANGE RIGHT  07/19/2019  . IR NEPHROSTOMY EXCHANGE RIGHT  09/13/2019  . IR NEPHROSTOMY EXCHANGE RIGHT  11/22/2019  . IR NEPHROSTOMY PLACEMENT LEFT  02/23/2019  . IR NEPHROSTOMY PLACEMENT RIGHT  02/23/2019  . LAPAROTOMY N/A 06/08/2019   Procedure: EXPLORATORY LAPAROTOMY;  Surgeon: Herbert Pun, MD;  Location: [facility];  Service: General;  Laterality: N/A;  . LEG TENDON SURGERY Right 1958  . PORTA CATH INSERTION N/A 01/22/2018   Procedure: PORTA CATH INSERTION;  Surgeon: [name], MD;  Location: [facility];  Service: Cardiovascular;  Laterality: N/A;  . TRANSURETHRAL RESECTION OF BLADDER TUMOR N/A 12/27/2017   Procedure: TRANSURETHRAL RESECTION OF BLADDER TUMOR (TURBT);  Surgeon: [name], MD;  Location: [facility];  Service: Urology;  Laterality: N/A;  . TRANSURETHRAL RESECTION OF BLADDER TUMOR N/A 01/15/2018   Procedure: TRANSURETHRAL RESECTION OF BLADDER TUMOR (TURBT);  Surgeon: [name], MD;  Location: [facility];  Service: Urology;  Laterality: N/A;  Need 2 hrs for this case please  . TRANSURETHRAL RESECTION OF BLADDER TUMOR N/A 07/18/2018   Procedure: TRANSURETHRAL RESECTION OF BLADDER TUMOR (TURBT);  Surgeon: [name], MD;  Location: [facility];  Service: Urology;  Laterality: N/A;    Social History   Socioeconomic History  . Marital status: Married    Spouse name: ruth  . Number of children: Not on file  . Years of education: Not on file  . Highest education level: Not on file  Occupational History  .  Occupation: retired    Comment: Therapist, [employer]  Tobacco Use  . Smoking status: Never Smoker  . Smokeless tobacco: Never Used  Substance and Sexual Activity  . Alcohol use: No    Alcohol/week: 0.0 standard drinks  . Drug use: No  . Sexual activity: Not Currently  Other Topics Concern  . Not on file  Social History Narrative  . Not on file   Social Determinants of Health   Financial Resource Strain: Unknown  . Difficulty of Paying Living Expenses: Patient refused  Food Insecurity: Unknown  . Worried About Charity fundraiser in the Last Year: Patient refused  . Ran Out of Food in the Last Year: Patient refused  Transportation Needs: Unknown  . Lack of Transportation (Medical): Patient refused  . Lack of Transportation (Non-Medical): Patient refused  Physical Activity: Unknown  . Days of Exercise per Week: Patient refused  . Minutes of Exercise per Session: Patient refused  Stress: Unknown  . Feeling of Stress : Patient refused  Social Connections: Unknown  . Frequency of Communication with Friends and Family: Patient refused  . Frequency of Social Gatherings with Friends and Family: Patient refused  . Attends Religious Services: Patient refused  . Active Member of Clubs or Organizations: Patient refused  . Attends Archivist Meetings: Patient refused  .  Marital Status: Patient refused  Intimate Partner Violence: Unknown  . Fear of Current or Ex-Partner: Patient refused  . Emotionally Abused: Patient refused  . Physically Abused: Patient refused  . Sexually Abused: Patient refused    Family History  Problem Relation Age of Onset  . Cancer Mother   . Chronic Renal Failure Mother   . Heart disease Father      Current Outpatient Medications:  .  acetaminophen (TYLENOL) 500 MG tablet, Take 1,000 mg by mouth every 6 (six) hours as needed for moderate pain or headache. , Disp: , Rfl:  .  cetirizine (ZYRTEC) 10 MG tablet, Take 10 mg by mouth daily as needed for  allergies. , Disp: , Rfl:  .  Cranberry-Cholecalciferol (SUPER CRANBERRY/VITAMIN D3) 4200-500 MG-UNIT CAPS, Take 1 capsule by mouth 2 (two) times daily. , Disp: , Rfl:  .  feeding supplement, ENSURE ENLIVE, (ENSURE ENLIVE) LIQD, Take 237 mLs by mouth 2 (two) times daily between meals. (Patient taking differently: Take 237 mLs by mouth 3 (three) times daily between meals. ), Disp: 237 mL, Rfl: 12 .  ferrous sulfate 325 (65 FE) MG tablet, Take 1 tablet (325 mg total) by mouth 2 (two) times daily with a meal., Disp: , Rfl: 3 .  Glycerin-Hypromellose-PEG 400 (DRY EYE RELIEF DROPS OP), Apply 1 drop to eye daily as needed. , Disp: , Rfl:  .  lidocaine-prilocaine (EMLA) cream, Apply 1 application topically as needed., Disp: 30 g, Rfl: 1 .  Multiple Vitamin (MULTIVITAMIN WITH MINERALS) TABS tablet, Take 1 tablet by mouth daily., Disp: , Rfl:  .  MYRBETRIQ 50 MG TB24 tablet, TAKE 1 TABLET BY MOUTH DAILY (Patient taking differently: Take 50 mg by mouth daily. ), Disp: 30 tablet, Rfl: 3 .  oxyCODONE (OXY IR/ROXICODONE) 5 MG immediate release tablet, TAKE 1 TABLET BY MOUTH EVERY 4 HOURS AS NEEDED FOR SEVERE PAIN (Patient taking differently: Take 5 mg by mouth every 4 (four) hours as needed for severe pain. ), Disp: 60 tablet, Rfl: 0 .  polyethylene glycol powder (MIRALAX) powder, Take 17 g by mouth daily as needed. Can increase to 3 times a day as needed for constipation but hold medication if has diarrhea, Disp: 255 g, Rfl: 0 .  vitamin B-12 (CYANOCOBALAMIN) 500 MCG tablet, Take 500 mcg by mouth daily., Disp: , Rfl:  .  nystatin (NYAMYC) powder, Apply topically 2 (two) times daily. (Patient not taking: Reported on 12/02/2019), Disp: 60 g, Rfl: 3 .  ondansetron (ZOFRAN) 8 MG tablet, Take 1 tablet (8 mg total) by mouth 2 (two) times daily as needed for refractory nausea / vomiting. Start on day 3 after chemo. (Patient not taking: Reported on 12/02/2019), Disp: 30 tablet, Rfl: 1 .  prochlorperazine (COMPAZINE) 10 MG  tablet, Take 1 tablet (10 mg total) by mouth every 6 (six) hours as needed (Nausea or vomiting). (Patient not taking: Reported on 11/11/2019), Disp: 30 tablet, Rfl: 1 .  traZODone (DESYREL) 50 MG tablet, Take 0.5 tablets (25 mg total) by mouth at bedtime as needed for sleep. (Patient not taking: Reported on 12/02/2019), Disp: 30 tablet, Rfl: 1 .  zinc oxide 20 % ointment, Apply 1 application topically as needed for irritation., Disp: , Rfl:   CT ABDOMEN PELVIS W CONTRAST  Result Date: 11/23/2019 CLINICAL DATA:  Concern for abscess or infection on adjacent the inferior stoma site EXAM: CT ABDOMEN AND PELVIS WITH CONTRAST TECHNIQUE: Multidetector CT imaging of the abdomen and pelvis was performed using the standard protocol following  bolus administration of intravenous contrast. CONTRAST:  12mL OMNIPAQUE IOHEXOL 300 MG/ML  SOLN COMPARISON:  Radiograph 10/13/2019 FINDINGS: Lower chest: Bilateral effusions, small on the right and trace on the left, similar to slightly decreased in size from comparison study. Adjacent passive atelectasis with more bandlike areas of scarring and/or subsegmental atelectatic changes well. Normal heart size. No pericardial effusion. Few coronary artery calcifications are noted. Hepatobiliary: No focal liver abnormality is seen. Patient is post cholecystectomy. Slight prominence of the biliary tree likely related to reservoir effect. No calcified intraductal gallstones. Pancreas: Unremarkable. No pancreatic ductal dilatation or surrounding inflammatory changes. Spleen: Normal in size without focal abnormality. Adrenals/Urinary Tract: Normal adrenal glands bilateral percutaneous nephrostomy tubes are in place. Pigtail catheter tips terminate appropriately within the proximal collecting systems. Some mild urothelial thickening is noted in both renal pelves, nonspecific. Redemonstration of the large complex centrally necrotic, enhancing mass lesion arising from the region of the bladder and  extending directly through the anterior abdominal wall. There is diminished intraluminal gas but with increasing cystic components of this lesion which measures approximately 12.2 x 10.4 x 13 cm in size. Visible necked connection with the urinary bladder is noted posteriorly (2/71) with extension to the anterior midline urostomy (2/66). Surrounding phlegmonous changes noted with adjacent reactive free fluid as well. Stomach/Bowel: Distal esophagus, stomach and duodenal sweep are unremarkable. No small bowel wall thickening or dilatation. No evidence of obstruction. A normal appendix is visualized. Diverting loop colostomy noted in the mid abdomen (2/26) distal colon demonstrates some scattered colonic diverticula without focal pericolonic inflammation the suggest diverticulitis. More diffuse segmental thickening is noted in the sigmoid colon as o'clock passes in close proximity to the heterogeneous bladder mass detailed above, the significance of which is indeterminate. Inspissated stool ball is noted within the rectum. Vascular/Lymphatic: Atherosclerotic plaque within the normal caliber aorta. Prominent retroperitoneal nodes are present though no grossly enlarged abdominopelvic adenopathy is seen. Reproductive: Postsurgical changes noted of the mildly enlarged prostate. Keyhole defect within the prostate could reflect sequela of prior TURP. Direct involvement of the prostate with the large, heterogeneous bladder mass. Other: Reactive free fluid layering in the deep pelvis, similar in extent to comparison. 4 Musculoskeletal: The osseous structures appear diffusely demineralized which may limit detection of small or nondisplaced fractures. Multilevel degenerative changes are present in the imaged portions of the spine. Bony fusion of L1-2 L3. 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 Mild canal stenosis and moderate bilateral neural foraminal narrowing at L5-S1. Severe degenerative changes  noted in both hips. More moderate degenerative change noted in the SI joints and pubic symphysis. Prior right femoral intramedullary nail and transcervical fixation. IMPRESSION: 1. Redemonstration of the large complex centrally necrotic, enhancing mass arising from the region of the bladder and extending directly through the anterior abdominal wall. There is diminished intraluminal gas but with increasing cystic components of this lesion which measures approximately 12.2 x 10.4 x 13 cm in size. Visible connection with the urinary bladder is noted posteriorly with extension to the anterior midline urostomy. Surrounding phlegmonous changes noted with adjacent reactive free fluid as well. 2. Bilateral percutaneous nephrostomy tubes are in place with mild urothelial thickening in both renal pelves, nonspecific and possibly reactive due to the presence of the stents versus secondary to urinary tract infection. 3. Bilateral pleural effusions, small on the right and trace on the left, similar to slightly decreased in size from comparison study. 4. Distal colonic thickening within the sigmoid in close proximity to the bladder mass,  as detailed above. Significance of which is indeterminate. Diverting loop colostomy in place. 5. Inspissated stool ball within the rectum may represent fecal impaction. Electronically Signed   By: Lovena Le M.D.   On: 11/23/2019 18:37   IR NEPHROSTOMY EXCHANGE LEFT  Result Date: 11/22/2019 INDICATION: History of bladder cancer with chronic bilateral percutaneous nephrostomy catheters. Patient presents today for routine fluoroscopic guided exchange. EXAM: FLUOROSCOPIC GUIDED BILATERAL SIDED NEPHROSTOMY CATHETER EXCHANGE COMPARISON:  Multiple previous fluoroscopic guided nephrostomy catheter exchanges, most recently on 09/13/2019; CT abdomen pelvis-10/13/2019 CONTRAST:  A total of 20 mL Isovue-300 administered was administered into both collecting systems FLUOROSCOPY TIME:  1 minute, 24  seconds (23 mGy) COMPLICATIONS: None immediate. TECHNIQUE: Informed written consent was obtained from the patient after a discussion of the risks, benefits and alternatives to treatment. Questions regarding the procedure were encouraged and answered. A timeout was performed prior to the initiation of the procedure. The bilateral flanks and external portions of existing nephrostomy catheters were prepped and draped in the usual sterile fashion. A sterile drape was applied covering the operative field. Maximum barrier sterile technique with sterile gowns and gloves were used for the procedure. A timeout was performed prior to the initiation of the procedure. A pre procedural spot fluoroscopic image was obtained. Beginning with the left-sided nephrostomy, a small amount of contrast was injected via the existing left-sided nephrostomy catheter demonstrating appropriate positioning within the renal pelvis. The existing nephrostomy catheter was cut and cannulated with a Benson wire which was coiled within the renal pelvis. Under intermittent fluoroscopic guidance, the existing nephrostomy catheter was exchanged for a new 12 [place] all-purpose drainage catheter. Limited contrast injection confirmed appropriate positioning within the left renal pelvis and a post exchange fluoroscopic image was obtained. The catheter was locked, secured to the skin with an interrupted suture and reconnected to a gravity bag. The identical repeat procedure was repeated for the contralateral right-sided nephrostomy, ultimately allowing successful exchange of a new 10.2 [place] all-purpose drainage catheter with end coiled and locked within the right renal pelvis. Dressings were placed. The patient tolerated the above procedures well without immediate postprocedural complication. FINDINGS: The existing nephrostomy catheters are appropriately positioned and functioning. After successful fluoroscopic guided exchange, new bilateral 12 French  nephrostomy catheters are coiled and locked within the respective renal pelvises. IMPRESSION: Successful fluoroscopic guided exchange of bilateral 12 French percutaneous nephrostomy catheters. Electronically Signed   By: Sandi Mariscal M.D.   On: 11/22/2019 10:54   IR NEPHROSTOMY EXCHANGE RIGHT  Result Date: 11/22/2019 INDICATION: History of bladder cancer with chronic bilateral percutaneous nephrostomy catheters. Patient presents today for routine fluoroscopic guided exchange. EXAM: FLUOROSCOPIC GUIDED BILATERAL SIDED NEPHROSTOMY CATHETER EXCHANGE COMPARISON:  Multiple previous fluoroscopic guided nephrostomy catheter exchanges, most recently on 09/13/2019; CT abdomen pelvis-10/13/2019 CONTRAST:  A total of 20 mL Isovue-300 administered was administered into both collecting systems FLUOROSCOPY TIME:  1 minute, 24 seconds (23 mGy) COMPLICATIONS: None immediate. TECHNIQUE: Informed written consent was obtained from the patient after a discussion of the risks, benefits and alternatives to treatment. Questions regarding the procedure were encouraged and answered. A timeout was performed prior to the initiation of the procedure. The bilateral flanks and external portions of existing nephrostomy catheters were prepped and draped in the usual sterile fashion. A sterile drape was applied covering the operative field. Maximum barrier sterile technique with sterile gowns and gloves were used for the procedure. A timeout was performed prior to the initiation of the procedure. A pre procedural spot fluoroscopic image  was obtained. Beginning with the left-sided nephrostomy, a small amount of contrast was injected via the existing left-sided nephrostomy catheter demonstrating appropriate positioning within the renal pelvis. The existing nephrostomy catheter was cut and cannulated with a Benson wire which was coiled within the renal pelvis. Under intermittent fluoroscopic guidance, the existing nephrostomy catheter was exchanged  for a new 12 [place] all-purpose drainage catheter. Limited contrast injection confirmed appropriate positioning within the left renal pelvis and a post exchange fluoroscopic image was obtained. The catheter was locked, secured to the skin with an interrupted suture and reconnected to a gravity bag. The identical repeat procedure was repeated for the contralateral right-sided nephrostomy, ultimately allowing successful exchange of a new 10.2 [place] all-purpose drainage catheter with end coiled and locked within the right renal pelvis. Dressings were placed. The patient tolerated the above procedures well without immediate postprocedural complication. FINDINGS: The existing nephrostomy catheters are appropriately positioned and functioning. After successful fluoroscopic guided exchange, new bilateral 12 French nephrostomy catheters are coiled and locked within the respective renal pelvises. IMPRESSION: Successful fluoroscopic guided exchange of bilateral 12 French percutaneous nephrostomy catheters. Electronically Signed   By: Sandi Mariscal M.D.   On: 11/22/2019 10:54    No images are attached to the encounter.   CMP Latest Ref Rng & Units 11/26/2019  Glucose 70 - 99 mg/dL 82  BUN 8 - 23 mg/dL 18  Creatinine 0.61 - 1.24 mg/dL 1.08  Sodium 135 - 145 mmol/L 142  Potassium 3.5 - 5.1 mmol/L 4.1  Chloride 98 - 111 mmol/L 110  CO2 22 - 32 mmol/L 24  Calcium 8.9 - 10.3 mg/dL 8.6(L)  Total Protein 6.5 - 8.1 g/dL -  Total Bilirubin 0.3 - 1.2 mg/dL -  Alkaline Phos 38 - 126 U/L -  AST 15 - 41 U/L -  ALT 0 - 44 U/L -   CBC Latest Ref Rng & Units 11/26/2019  WBC 4.0 - 10.5 K/uL 23.5(H)  Hemoglobin 13.0 - 17.0 g/dL 9.4(L)  Hematocrit 39.0 - 52.0 % 30.3(L)  Platelets 150 - 400 K/uL 337     Observation/objective: Appears in no acute distress of a video visit today.  Breathing is nonlabored  Assessment and plan: Patient is a 85 year old male with locally advanced metastatic bladder cancer complicated by  bilateral nephrostomy, diverting colostomy for malignant large bowel obstruction as well as anterior abdominal wall wound from underlying tumor.  This is a routine follow-up visit  Patient is currently at home and getting home palliative care.  Overall he seems to be doing well and is not in significant pain.  They do have as needed pain medications to give him if need be.  Bowel movements through his colostomy bag have been regular.  He does not appear to be in overt discomfort at this time.  Patient's wife understands that his tumor will likely continue to grow and cause further problems with his anterior abdominal wall wound and may also lead to a bowel obstruction in the future.  For now plan is to continue with the supportive/home-based palliative care.  Patient and his family are not considering hospice yet.  They understand that his overall prognosis is poor and there is no warmth plans for systemic treatment.  He is a DNR  Follow-up instructions: Follow-up with me in 3 months for a video visit  I discussed the assessment and treatment plan with the patient. The patient was provided an opportunity to ask questions and all were answered. The patient agreed with the  plan and demonstrated an understanding of the instructions.   The patient was advised to call back or seek an in-person evaluation if the symptoms worsen or if the condition fails to improve as anticipated.   Visit Diagnosis: 1. Goals of care, counseling/discussion   2. Urothelial cancer ([place])     Dr. Randa Evens, MD, MPH [facility] at [facility] Tel- [phone] 12/03/2019 8:25 AM

## 2019-12-06 ENCOUNTER — Other Ambulatory Visit: Payer: Self-pay

## 2019-12-06 ENCOUNTER — Other Ambulatory Visit: Payer: Medicare Other | Admitting: Primary Care

## 2019-12-06 DIAGNOSIS — Z515 Encounter for palliative care: Secondary | ICD-10-CM

## 2019-12-06 NOTE — Progress Notes (Signed)
[place] Consult Note Telephone: [phone]  Fax: [phone]    PATIENT NAME: Vincent Poole [address] [place] [address] [phone] (home)  DOB: Apr 28, 1935 MRN: [id number]  PRIMARY CARE PROVIDER:   [name], MD, [place] [address] [phone]  REFERRING PROVIDER:  [name], [place] [facility] [place],  [place] [address] [phone]  RESPONSIBLE PARTY:   Extended Emergency Contact Information Primary Emergency Contact: Canton,[name] Address: [place], [place] [address] [place] Phone: [phone] Mobile Phone: [phone] Relation: Spouse Secondary Emergency Contact: Redmond School Address: [address]          [place], [place] [address] Johnnette Litter of [place] Phone: [phone] Mobile Phone: [phone] Relation: Daughter   ASSESSMENT AND RECOMMENDATIONS:   1. Advance Care Planning/Goals of Care: Goals include to maximize quality of life and symptom management. We discussed current treatments. He has finished radiation and will not be a candidate for future. He will not be a candidate for further chemo. We discussed hospice. Family has had good and bad experiences with hospice. Wife voiced it would make Mr. Blue give up. We discussed the supportive nature of hospice services for patient and the family. They may continue to consider. We discussed home health as well, he has fungation of tumor and may benefit from some rehab services. Again they feel they are handling things at this time.  We discussed advance directives, they have a DNR but we also prepared a MOST today. Uploaded to [facility].  2. Symptom Management:   Nutrition: States he's eating well. 143 lbs currently. Normal wt. Was 198 lbs 2 lbs 2 years ago. 4 ensures a day intake  And will eat some breakfast, best  meal of the day. Appears to be eating a full diet.  Pain: In the inguinal area, only several times a week that requires pain med.   Skin integrity: Has 2 nephrostomies and ileostomy. Also a fungating tumor with bag. The area may be extending with some weeping. CurrentlIy using a dry gauze on it but it's excoriated and needs something non stick.Suggested foam dressing. They will purchase.  3. Family /Caregiver/Community Supports:  Lives with wife at granddaughter's home.  Other family supportive.  4. Cognitive / Functional decline: Alert, oriented x 1-2. Very HOH, not interactive because of this. Physical decline, dependent in mos  adls, all iadls.  5. Follow up Palliative Care Visit: Palliative care will continue to follow for goals of care clarification and symptom management. Recommend hospice referral as soon as family would like services. Return 4 weeks or prn.  I spent 60 minutes providing this consultation,  from 1100 to 1200. More than 50% of the time in this consultation was spent coordinating communication.   HISTORY OF PRESENT ILLNESS:  Vincent Poole is a 85 y.o. year old male with multiple medical problems including  locally advanced urothelial cancer, nephrostomies, malignant bowel obstruction and diverting colostomy, h/o falls and fx.. Palliative Care was asked to follow this patient by consultation request of [name], MD to help address advance care planning and goals of care. This is a follow up visit.  CODE STATUS: DNR, MOST done with DNR, limited scope, use of abx, iv fluids limited, no feeding tube.  PPS: 30% HOSPICE ELIGIBILITY/DIAGNOSIS: yes/ regionally advanced bladder cancer.  PAST MEDICAL  HISTORY:  Past Medical History:  Diagnosis Date  . Anxiety   . Arthritis   . Bladder cancer ([place])   . Dysrhythmia 07/2018   history of atrial flutter that worsens with anxiety  . Femur fracture, right ([place]) 05/01/2018  . GERD (gastroesophageal reflux disease)   .  History of recent blood transfusion 05/2018  . Hypertension   . Iron deficiency anemia 09/14/2018  . Prostate cancer ([place]) 07/2018   cancer growing in prostate but not prostate cancer, it is from the bladder  . Umbilical hernia 78/9381  . Urinary retention 2019   foley catheter place 11/2017  . UTI (urinary tract infection) 2019   frequent UTI's over last year  . Wound eschar of foot 07/2018   left heal getting wrapped and requiring antibiotic cream. cracks open with weight bearing.    SOCIAL HX:  Social History   Tobacco Use  . Smoking status: Never Smoker  . Smokeless tobacco: Never Used  Substance Use Topics  . Alcohol use: No    Alcohol/week: 0.0 standard drinks    ALLERGIES: No Known Allergies   PERTINENT MEDICATIONS:  Outpatient Encounter Medications as of 12/06/2019  Medication Sig  . acetaminophen (TYLENOL) 500 MG tablet Take 1,000 mg by mouth every 6 (six) hours as needed for moderate pain or headache.   . cetirizine (ZYRTEC) 10 MG tablet Take 10 mg by mouth daily as needed for allergies.   . Cranberry-Cholecalciferol (SUPER CRANBERRY/VITAMIN D3) 4200-500 MG-UNIT CAPS Take 1 capsule by mouth 2 (two) times daily.   . feeding supplement, ENSURE ENLIVE, (ENSURE ENLIVE) LIQD Take 237 mLs by mouth 2 (two) times daily between meals. (Patient taking differently: Take 237 mLs by mouth 3 (three) times daily between meals. )  . ferrous sulfate 325 (65 FE) MG tablet Take 1 tablet (325 mg total) by mouth 2 (two) times daily with a meal.  . Glycerin-Hypromellose-PEG 400 (DRY EYE RELIEF DROPS OP) Apply 1 drop to eye daily as needed.   . lidocaine-prilocaine (EMLA) cream Apply 1 application topically as needed.  . mirabegron ER (MYRBETRIQ) 50 MG TB24 tablet Take 1 tablet (50 mg total) by mouth daily.  . Multiple Vitamin (MULTIVITAMIN WITH MINERALS) TABS tablet Take 1 tablet by mouth daily.  Marland Kitchen nystatin [facility]) powder Apply topically 2 (two) times daily. (Patient not taking: Reported on  12/02/2019)  . ondansetron (ZOFRAN) 8 MG tablet Take 1 tablet (8 mg total) by mouth 2 (two) times daily as needed for refractory nausea / vomiting. Start on day 3 after chemo. (Patient not taking: Reported on 12/02/2019)  . oxyCODONE (OXY IR/ROXICODONE) 5 MG immediate release tablet TAKE 1 TABLET BY MOUTH EVERY 4 HOURS AS NEEDED FOR SEVERE PAIN (Patient taking differently: Take 5 mg by mouth every 4 (four) hours as needed for severe pain. )  . polyethylene glycol powder (MIRALAX) powder Take 17 g by mouth daily as needed. Can increase to 3 times a day as needed for constipation but hold medication if has diarrhea  . prochlorperazine (COMPAZINE) 10 MG tablet Take 1 tablet (10 mg total) by mouth every 6 (six) hours as needed (Nausea or vomiting). (Patient not taking: Reported on 11/11/2019)  . traZODone (DESYREL) 50 MG tablet Take 0.5 tablets (25 mg total) by mouth at bedtime as needed for sleep. (Patient not taking: Reported on 12/02/2019)  . vitamin B-12 (CYANOCOBALAMIN) 500 MCG tablet Take 500 mcg by mouth daily.  Marland Kitchen zinc oxide 20 % ointment Apply 1 application topically as needed for irritation.  No facility-administered encounter medications on file as of 12/06/2019.    PHYSICAL EXAM / ROS:   Current and past weights: 143 lbs now, 150 lbs 2 months ago. General: NAD, frail appearing, thin Cardiovascular: no chest pain reported, no edema today but do swell with dependency.  Pulmonary: no cough, no increased SOB, room air Abdomen: appetite fair, no constipation, has ileostomy incontinent of bowel, has some rectal discharge on occasion.Albumin 3.1 g/dl. Now, was 2.9 g/dl.several weeks ago GU:  Has bil nephrostomy tubes changed q 6 weeks.draining into bag, urine clear MSK:  no joint deformities, non ambulatory, cannot bear weight for transfers due to a hip fx 2 years ago.  Skin: fungation of tumor in abdomen. Neurological: Weakness, occ pain, HOH  Jason Coop, NP, [facility]  COVID-19 PATIENT  SCREENING TOOL  Person answering questions: ________Mrs Martin__________ _____   1.  Is the patient or any family member in the home showing any signs or symptoms regarding respiratory infection?               Person with Symptom- __________NA_________________  a. Fever                                                                          Yes___ No___          ___________________  b. Shortness of breath                                                    Yes___ No___          ___________________ c. Cough/congestion                                       Yes___  No___         ___________________ d. Body aches/pains                                                         Yes___ No___        ____________________ e. Gastrointestinal symptoms (diarrhea, nausea)           Yes___ No___        ____________________  2. Within the past 14 days, has anyone living in the home had any contact with someone with or under investigation for COVID-19?    Yes___ No_x_   Person __________________

## 2019-12-10 ENCOUNTER — Other Ambulatory Visit: Payer: Self-pay | Admitting: Radiology

## 2019-12-10 DIAGNOSIS — N1339 Other hydronephrosis: Secondary | ICD-10-CM

## 2019-12-16 ENCOUNTER — Other Ambulatory Visit: Payer: Self-pay | Admitting: Radiology

## 2019-12-16 DIAGNOSIS — N390 Urinary tract infection, site not specified: Secondary | ICD-10-CM

## 2019-12-16 DIAGNOSIS — N1339 Other hydronephrosis: Secondary | ICD-10-CM

## 2019-12-16 MED ORDER — CIPROFLOXACIN HCL 500 MG PO TABS: 500.0000 mg | ORAL_TABLET | Freq: Two times a day (BID) | ORAL | 0 refills | Status: DC

## 2019-12-18 ENCOUNTER — Telehealth: Payer: Self-pay

## 2019-12-18 NOTE — Telephone Encounter (Signed)
Patient's wife called stating she believes that he has a UTI and would like abx sent in. She states that his symptoms are dark urine and odor, denies fever, chills, nausea and vomiting. It was explained that dark urine and odor are typically due to lack of water and he should increase fluid intake and see if symptoms resolve. If symptoms do not resolve or worsen she will call back for UTI check with PA

## 2019-12-26 ENCOUNTER — Other Ambulatory Visit: Payer: Self-pay | Admitting: Oncology

## 2019-12-30 ENCOUNTER — Other Ambulatory Visit: Payer: Self-pay | Admitting: *Deleted

## 2019-12-30 ENCOUNTER — Telehealth: Payer: Self-pay | Admitting: *Deleted

## 2019-12-30 MED ORDER — OXYCODONE HCL 5 MG PO TABS: ORAL_TABLET | ORAL | 0 refills | Status: DC

## 2019-12-30 NOTE — Telephone Encounter (Signed)
Wife called today and asked for refill of oxycodone. She states that he stills has stomach pain due to the tumor coming through skin. He has a new spot on stomach, the other spot has bag on it. He is doing good, but weak still. I told her that [facility] and she will pick it up tom

## 2020-01-03 ENCOUNTER — Other Ambulatory Visit: Payer: Self-pay

## 2020-01-03 ENCOUNTER — Ambulatory Visit
Admission: RE | Admit: 2020-01-03 | Discharge: 2020-01-03 | Disposition: A | Discharge status: Home / Self Care | Payer: Medicare Other | Source: Ambulatory Visit | Attending: Urology | Admitting: Urology

## 2020-01-03 DIAGNOSIS — N1339 Other hydronephrosis: Secondary | ICD-10-CM | POA: Diagnosis not present

## 2020-01-03 DIAGNOSIS — Z436 Encounter for attention to other artificial openings of urinary tract: Secondary | ICD-10-CM | POA: Insufficient documentation

## 2020-01-03 HISTORY — PX: IR NEPHROSTOMY EXCHANGE RIGHT: IMG6070

## 2020-01-03 HISTORY — PX: IR NEPHROSTOMY EXCHANGE LEFT: IMG6069

## 2020-01-03 MED ORDER — IODIXANOL 320 MG/ML IV SOLN: 50.0000 mL | Freq: Once | INTRAVENOUS | Status: DC | PRN

## 2020-01-03 NOTE — Procedures (Signed)
Interventional Radiology Procedure Note  Procedure: bilateral pcn exchgs 12 fr  Complications: None  Estimated Blood Loss: none  Findings: uncomplicateds exchgs

## 2020-01-06 ENCOUNTER — Other Ambulatory Visit: Payer: Self-pay

## 2020-01-06 ENCOUNTER — Other Ambulatory Visit: Payer: Medicare Other | Admitting: Primary Care

## 2020-01-06 DIAGNOSIS — Z515 Encounter for palliative care: Secondary | ICD-10-CM

## 2020-01-06 NOTE — Progress Notes (Signed)
[place] Consult Note Telephone: [phone]  Fax: [phone]    PATIENT NAME: Vincent Poole [address] [place] [address] [phone] (home)  DOB: August 06, 1935 MRN: [id number]  PRIMARY CARE PROVIDER:   [name], MD, [place] [address] [phone]  REFERRING PROVIDER:  [name], [place] [facility] [place],  [place] [address] [phone]  RESPONSIBLE PARTY:   Extended Emergency Contact Information Primary Emergency Contact: Ricketts,[name] Address: [place], [place] [address] Johnnette Litter of [place] Phone: [phone] Mobile Phone: [phone] Relation: Spouse Secondary Emergency Contact: Redmond School Address: [address]          [place], [place] [address] Johnnette Litter of [place] Phone: [phone] Mobile Phone: [phone] Relation: Daughter  I met with Mr. Kos his daughter and granddaughter at his granddaughter's home.   ASSESSMENT AND RECOMMENDATIONS:   1. Advance Care Planning/Goals of Care: Goals include to maximize quality of life and symptom management. MOST created on last visit. DNR, MOST done with DNR, limited scope, use of abx, iv fluids limited, no feeding tube.  2. Symptom Management:   Dyspnea: He has continued to decline now endorsing difficulty sometimes breathing and swallowing. He does not demonstrate dyspnea on my exam.   Drainage/Wound care: He has a fungating abdominal tumor that has continued to drain and likely bowel contents. Also using a foam dressing. They have an ostomy bag on this area. Colostomy  is also functioning and he has recently had his his bilateral nephrostomy tube's changed.   Intake: We discussed intake and some dysphagia I encouraged him to make sure he's in  high Fow[place] and to give him a mechanical soft/chopped diet.   Pain control: He  endorses pain and frequently will not take his PRN until it's "so bad I can't stand it" .We discussed ATC pain management especially using Tylenol, which gives him good relief and then including oxycodone 5 mg q 4 h prn  in addition if needed. They endorsed understanding.  Nausea and vomiting: Has been reported  on several occasions. I encourage them to use the Compazine and to make sure he remains hydrated due to loss of fluids in bowel and wound drainage.   Disposition: We discussed hospice again. They had a negative experience in the past and are not very open. I encourage them to consider home hospice, I am not  recommending IPU at this time. Home hospice would allow for more frequent assessment by the hospice nurse and symptom management as his cancer progresses. His wife (and POA) was not there today but his daughter and granddaughter voice understanding and will discuss with patient's wife.  3. Family /Caregiver/Community Supports:   Lives with granddaughter and wife. Family are caregivers.   4. Cognitive / Functional decline: Alert, oriented x 2. Very HOH which makes communication difficult. Bed bound most days, can go out in w/c for MD appt but is very fatigued after it.  5. Follow up Palliative Care Visit: Palliative care will continue to follow for goals of care clarification and symptom management. Return 2 weeks or prn.  I spent 60 minutes providing this consultation,  from 1230 to 1330. More than 50% of the time in this consultation was spent coordinating communication.   HISTORY OF PRESENT ILLNESS:  Vincent BOOHER is a 85 y.o. year old male  with multiple medical problems including locally advanced urothelial cancer, nephrostomies, malignant bowel obstruction and diverting colostomy, h/o falls and fx. Palliative Care was asked to follow this patient by consultation request of [name], MD to help address advance care planning and goals of care. This is a follow up  visit.  CODE STATUS: DNR, MOST done with DNR, limited scope, use of abx, iv fluids limited, no feeding tube.  PPS: 30% HOSPICE ELIGIBILITY/DIAGNOSIS: yes/ regionally advanced bladder cancer.  PAST MEDICAL HISTORY:  Past Medical History:  Diagnosis Date  . Anxiety   . Arthritis   . Bladder cancer ([place])   . Dysrhythmia 07/2018   history of atrial flutter that worsens with anxiety  . Femur fracture, right ([place]) 05/01/2018  . GERD (gastroesophageal reflux disease)   . History of recent blood transfusion 05/2018  . Hypertension   . Iron deficiency anemia 09/14/2018  . Prostate cancer ([place]) 07/2018   cancer growing in prostate but not prostate cancer, it is from the bladder  . Umbilical hernia 93/7342  . Urinary retention 2019   foley catheter place 11/2017  . UTI (urinary tract infection) 2019   frequent UTI's over last year  . Wound eschar of foot 07/2018   left heal getting wrapped and requiring antibiotic cream. cracks open with weight bearing.    SOCIAL HX:  Social History   Tobacco Use  . Smoking status: Never Smoker  . Smokeless tobacco: Never Used  Substance Use Topics  . Alcohol use: No    Alcohol/week: 0.0 standard drinks    ALLERGIES: No Known Allergies   PERTINENT MEDICATIONS:  Outpatient Encounter Medications as of 01/06/2020  Medication Sig  . acetaminophen (TYLENOL) 500 MG tablet Take 1,000 mg by mouth every 6 (six) hours as needed for moderate pain or headache.   . cetirizine (ZYRTEC) 10 MG tablet Take 10 mg by mouth daily as needed for allergies.   . ciprofloxacin (CIPRO) 500 MG tablet Take 1 tablet (500 mg total) by mouth 2 (two) times daily.  . Cranberry-Cholecalciferol (SUPER CRANBERRY/VITAMIN D3) 4200-500 MG-UNIT CAPS Take 1 capsule by mouth 2 (two) times daily.   . feeding supplement, ENSURE ENLIVE, (ENSURE ENLIVE) LIQD Take 237 mLs by mouth 2 (two) times daily between meals. (Patient taking differently: Take 237 mLs by mouth 3 (three) times daily between  meals. )  . ferrous sulfate 325 (65 FE) MG tablet Take 1 tablet (325 mg total) by mouth 2 (two) times daily with a meal.  . Glycerin-Hypromellose-PEG 400 (DRY EYE RELIEF DROPS OP) Apply 1 drop to eye daily as needed.   . lidocaine-prilocaine (EMLA) cream Apply 1 application topically as needed.  . mirabegron ER (MYRBETRIQ) 50 MG TB24 tablet Take 1 tablet (50 mg total) by mouth daily.  . Multiple Vitamin (MULTIVITAMIN WITH MINERALS) TABS tablet Take 1 tablet by mouth daily.  Marland Kitchen nystatin [facility]) powder Apply topically 2 (two) times daily. (Patient not taking: Reported on 12/02/2019)  . ondansetron (ZOFRAN) 8 MG tablet Take 1 tablet (8 mg total) by mouth 2 (two) times daily as needed for refractory nausea / vomiting. Start on day 3 after chemo. (Patient not taking: Reported on 12/02/2019)  . oxyCODONE (OXY IR/ROXICODONE) 5 MG immediate release tablet TAKE 1 TABLET BY MOUTH EVERY 4 HOURS AS NEEDED FOR SEVERE PAIN  . polyethylene glycol powder (MIRALAX) powder Take 17 g by mouth daily as needed. Can increase to 3 times a day as needed for constipation but hold medication if has diarrhea  .  prochlorperazine (COMPAZINE) 10 MG tablet Take 1 tablet (10 mg total) by mouth every 6 (six) hours as needed (Nausea or vomiting). (Patient not taking: Reported on 11/11/2019)  . traZODone (DESYREL) 50 MG tablet Take 0.5 tablets (25 mg total) by mouth at bedtime as needed for sleep. (Patient not taking: Reported on 12/02/2019)  . vitamin B-12 (CYANOCOBALAMIN) 500 MCG tablet Take 500 mcg by mouth daily.  Marland Kitchen zinc oxide 20 % ointment Apply 1 application topically as needed for irritation.   No facility-administered encounter medications on file as of 01/06/2020.    PHYSICAL EXAM / ROS:   Current and past weights: no recent, last weight 148 lbs General: NAD, frail appearing, thin Cardiovascular: no chest pain reported, no edema,  Pulmonary: + cough, coughs during eating, endorse dysphagia, no increased SOB Abdomen: appetite  50% ensure about 1-2/ day, denies constipation, ostomy: incontinent of bowel, has had some n/v several days ago. GU: denies dysuria, nephrostomy tube changes q 6 weeks, urine clear MSK:  no joint deformities,non -Ambulatory, pivot transfers Skin: denies sacral breakdown, abdomenal fungating tumor exuding green drainage, foul odor.  Jason Coop, NP  [facility]  COVID-19 PATIENT SCREENING TOOL  Person answering questions: ________Whitney____ _____   1.  Is the patient or any family member in the home showing any signs or symptoms regarding respiratory infection?               Person with Symptom- ___________na________________  a. Fever                                                                          Yes___ No___          ___________________  b. Shortness of breath                                                    Yes___ No___          ___________________ c. Cough/congestion                                       Yes___  No___         ___________________ d. Body aches/pains                                                         Yes___ No___        ____________________ e. Gastrointestinal symptoms (diarrhea, nausea)           Yes___ No___        ____________________  2. Within the past 14 days, has anyone living in the home had any contact with someone with or under investigation for COVID-19?    Yes___ No_x_   Person __________________

## 2020-01-13 ENCOUNTER — Other Ambulatory Visit: Payer: Self-pay | Admitting: Radiology

## 2020-01-13 DIAGNOSIS — N1339 Other hydronephrosis: Secondary | ICD-10-CM

## 2020-01-14 ENCOUNTER — Other Ambulatory Visit: Payer: Self-pay | Admitting: Radiology

## 2020-01-14 DIAGNOSIS — N390 Urinary tract infection, site not specified: Secondary | ICD-10-CM

## 2020-01-14 DIAGNOSIS — N1339 Other hydronephrosis: Secondary | ICD-10-CM

## 2020-01-14 MED ORDER — CIPROFLOXACIN HCL 500 MG PO TABS: 500.0000 mg | ORAL_TABLET | Freq: Two times a day (BID) | ORAL | 0 refills | Status: DC

## 2020-01-20 ENCOUNTER — Other Ambulatory Visit: Payer: Medicare Other | Admitting: Primary Care

## 2020-01-20 ENCOUNTER — Other Ambulatory Visit: Payer: Self-pay

## 2020-01-20 ENCOUNTER — Other Ambulatory Visit: Payer: Self-pay | Admitting: Oncology

## 2020-01-20 DIAGNOSIS — Z515 Encounter for palliative care: Secondary | ICD-10-CM

## 2020-01-20 MED ORDER — OXYCODONE HCL 10 MG PO TABS: 10.0000 mg | ORAL_TABLET | ORAL | 0 refills | Status: AC | PRN

## 2020-01-20 NOTE — Progress Notes (Signed)
[facility] Consult Note Telephone: [phone]  Fax: [phone]    PATIENT NAME: Vincent Poole [address] [place] [address] [phone] (home)  DOB: 03/30/1935 MRN: [id number]  PRIMARY CARE PROVIDER:   [name], MD, [place] [address] [phone]  REFERRING PROVIDER:  [name], [place] [facility] [place],  [place] [address] [phone]  RESPONSIBLE PARTY:   Extended Emergency Contact Information Primary Emergency Contact: Garis,[name] Address: [place], [place] [address] [place] Phone: [phone] Mobile Phone: [phone] Relation: Spouse Secondary Emergency Contact: [employer] Address: [address]          [place], [place] [address] [place] of [place] Phone: [phone] Mobile Phone: [phone] Relation: Daughter  ASSESSMENT AND RECOMMENDATIONS:   1. Advance Care Planning/Goals of Care: Goals include to maximize quality of life and symptom management. DNR, MOST done with DNR, limited scope, use of abx, iv fluids limited, no feeding tube. He is eating bites daily, sometimes a bit more. He is drinking plenty of fluids. He is alert and oriented with periods of confusion. OOB in w/c once a day. He has 2 areas of fungation that are draining and we're working on an ostomy wound system. These tumor fistulas drain dark drainage.   2. Symptom Management:   Wound: Ostomy for wound drainage, has 2 open areas draining foul smelling drainage. 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  Flies are prevalent in the room. Wife will call [facility] to get a wound drainage pouch system to hopefully handle the drainage.   Nutrition: Eating poorly, most meals bits/sips.  He is asking for fluids and drinks well.  Nephrostomy: Has these changed every 6 weeks, and needs it due to sediment. Continues to  have nephrostomy drains that are leaking.  Would it be possible to flush with stopcock and not change out due to debility?  I will f/u with urology.  Confusion: Wife states some confusion. He appears more debilitated, and is appropriate for  Hospice, disease progression wise,  but family is still wanting to pursue nephrostomy changes. Also have declined hospice due to past experiences.  Pain: Is taking Tylenol ER ATC and has been taking 5 mg oxycodone  1-2 every 4 hrs. ATC tylenol is helping but is not enough to control pain. Counseled to take 10 mg as his pain is increased. I explained to take 10 mg q 4 hrs x 3 doses then q 4 hrs ongoing for ongoing pain management.  I explained PAINAD.  3. Family /Caregiver/Community Supports: Has family in area. Adult children help with care.  We discussed them planning for end of life. He lives with granddaughter and her family and small children.   4. Cognitive / Functional decline:  Has some confusion, HOH which makes communication difficult for him. Bedbound most of the time, but family gets oob to w/c for bathing, bed changes.   5. Follow up Palliative Care Visit: Palliative care will continue to follow for goals of care clarification and symptom management. Return 2 weeks or prn.  I spent 60 minutes providing this consultation,  from 1130 to 1230. More than 50% of the time in this consultation was spent coordinating communication.   HISTORY OF PRESENT ILLNESS:  Vincent Poole is a 85 y.o. year old male with multiple medical problems including locally advanced  urothelial cancer, nephrostomies, malignant bowel obstruction and diverting colostomy, h/o falls and fx. Palliative Care was asked to follow this patient by consultation request of [name], MD to help address advance care planning and goals of care. This is a follow up visit.  CODE STATUS: DNR, MOST done with DNR, limited scope, use of abx, iv fluids limited, no feeding tube.  PPS: 30%  (weak) HOSPICE ELIGIBILITY/DIAGNOSIS: yes/invasive bladder cancer  PAST MEDICAL HISTORY:  Past Medical History:  Diagnosis Date  . Anxiety   . Arthritis   . Bladder cancer ([place])   . Dysrhythmia 07/2018   history of atrial flutter that worsens with anxiety  . Femur fracture, right ([facility]) 05/01/2018  . GERD (gastroesophageal reflux disease)   . History of recent blood transfusion 05/2018  . Hypertension   . Iron deficiency anemia 09/14/2018  . Prostate cancer ([facility]) 07/2018   cancer growing in prostate but not prostate cancer, it is from the bladder  . Umbilical hernia 24/4010  . Urinary retention 2019   foley catheter place 11/2017  . UTI (urinary tract infection) 2019   frequent UTI's over last year  . Wound eschar of foot 07/2018   left heal getting wrapped and requiring antibiotic cream. cracks open with weight bearing.    SOCIAL HX:  Social History   Tobacco Use  . Smoking status: Never Smoker  . Smokeless tobacco: Never Used  Substance Use Topics  . Alcohol use: No    Alcohol/week: 0.0 standard drinks    ALLERGIES: No Known Allergies   PERTINENT MEDICATIONS:  Outpatient Encounter Medications as of 01/20/2020  Medication Sig  . acetaminophen (TYLENOL) 500 MG tablet Take 1,000 mg by mouth every 6 (six) hours as needed for moderate pain or headache.   . cetirizine (ZYRTEC) 10 MG tablet Take 10 mg by mouth daily as needed for allergies.   Derrill Memo ON 02/13/2020] ciprofloxacin (CIPRO) 500 MG tablet Take 1 tablet (500 mg total) by mouth 2 (two) times daily.  . Cranberry-Cholecalciferol (SUPER CRANBERRY/VITAMIN D3) 4200-500 MG-UNIT CAPS Take 1 capsule by mouth 2 (two) times daily.   . feeding supplement, ENSURE ENLIVE, (ENSURE ENLIVE) LIQD Take 237 mLs by mouth 2 (two) times daily between meals. (Patient taking differently: Take 237 mLs by mouth 3 (three) times daily between meals. )  . ferrous sulfate 325 (65 FE) MG tablet Take 1 tablet (325 mg total) by mouth 2 (two) times daily  with a meal.  . Glycerin-Hypromellose-PEG 400 (DRY EYE RELIEF DROPS OP) Apply 1 drop to eye daily as needed.   . lidocaine-prilocaine (EMLA) cream Apply 1 application topically as needed.  . mirabegron ER (MYRBETRIQ) 50 MG TB24 tablet Take 1 tablet (50 mg total) by mouth daily.  . Multiple Vitamin (MULTIVITAMIN WITH MINERALS) TABS tablet Take 1 tablet by mouth daily.  Marland Kitchen nystatin [facility]) powder Apply topically 2 (two) times daily. (Patient not taking: Reported on 12/02/2019)  . ondansetron (ZOFRAN) 8 MG tablet Take 1 tablet (8 mg total) by mouth 2 (two) times daily as needed for refractory nausea / vomiting. Start on day 3 after chemo. (Patient not taking: Reported on 12/02/2019)  . oxyCODONE (OXY IR/ROXICODONE) 5 MG immediate release tablet TAKE 1 TABLET BY MOUTH EVERY 4 HOURS AS NEEDED FOR SEVERE PAIN  . polyethylene glycol powder (MIRALAX) powder Take 17 g by mouth daily as needed. Can increase to 3 times a day as needed for constipation but hold medication if has diarrhea  . prochlorperazine (COMPAZINE) 10 MG tablet  Take 1 tablet (10 mg total) by mouth every 6 (six) hours as needed (Nausea or vomiting). (Patient not taking: Reported on 11/11/2019)  . traZODone (DESYREL) 50 MG tablet Take 0.5 tablets (25 mg total) by mouth at bedtime as needed for sleep. (Patient not taking: Reported on 12/02/2019)  . vitamin B-12 (CYANOCOBALAMIN) 500 MCG tablet Take 500 mcg by mouth daily.  Marland Kitchen zinc oxide 20 % ointment Apply 1 application topically as needed for irritation.   No facility-administered encounter medications on file as of 01/20/2020.    PHYSICAL EXAM / ROS:  Current and past weights: unavailable General: NAD, frail appearing, thin, ill appearing Cardiovascular: no chest pain reported, no edema Pulmonary: no cough, no increased SOB Abdomen: appetite fair, denies constipation, ostomy GU: nephrostomies bil draining clear yellow urine MSK:  no joint deformities,  Non ambulatory, oob with total  assistance. Skin: leaking wound on abdomen from fungating tumor, other skin intact Neurological: Weakness increasing, sleeping a lot, cannot rate pain  Jason Coop, NP [facility]  COVID-19 PATIENT SCREENING TOOL  Person answering questions: ___________Mrs Martin______ _____   1.  Is the patient or any family member in the home showing any signs or symptoms regarding respiratory infection?               Person with Symptom- __________NA_________________  a. Fever                                                                          Yes___ No___          ___________________  b. Shortness of breath                                                    Yes___ No___          ___________________ c. Cough/congestion                                       Yes___  No___         ___________________ d. Body aches/pains                                                         Yes___ No___        ____________________ e. Gastrointestinal symptoms (diarrhea, nausea)           Yes___ No___        ____________________  2. Within the past 14 days, has anyone living in the home had any contact with someone with or under investigation for COVID-19?    Yes___ No_X_   Person __________________

## 2020-01-21 ENCOUNTER — Emergency Department: Payer: Medicare Other

## 2020-01-21 ENCOUNTER — Inpatient Hospital Stay
Admission: EM | Admit: 2020-01-21 | Discharge: 2020-01-26 | DRG: 871 | Disposition: A | Discharge status: Hospice | Payer: Medicare Other | Attending: Internal Medicine | Admitting: Internal Medicine

## 2020-01-21 ENCOUNTER — Other Ambulatory Visit: Payer: Self-pay

## 2020-01-21 DIAGNOSIS — Z20822 Contact with and (suspected) exposure to covid-19: Secondary | ICD-10-CM | POA: Diagnosis present

## 2020-01-21 DIAGNOSIS — Z9049 Acquired absence of other specified parts of digestive tract: Secondary | ICD-10-CM

## 2020-01-21 DIAGNOSIS — C61 Malignant neoplasm of prostate: Secondary | ICD-10-CM | POA: Diagnosis present

## 2020-01-21 DIAGNOSIS — M1610 Unilateral primary osteoarthritis, unspecified hip: Secondary | ICD-10-CM | POA: Diagnosis present

## 2020-01-21 DIAGNOSIS — N99528 Other complication of other external stoma of urinary tract: Secondary | ICD-10-CM | POA: Diagnosis not present

## 2020-01-21 DIAGNOSIS — E861 Hypovolemia: Secondary | ICD-10-CM | POA: Diagnosis present

## 2020-01-21 DIAGNOSIS — Z8249 Family history of ischemic heart disease and other diseases of the circulatory system: Secondary | ICD-10-CM

## 2020-01-21 DIAGNOSIS — E871 Hypo-osmolality and hyponatremia: Secondary | ICD-10-CM

## 2020-01-21 DIAGNOSIS — Y838 Other surgical procedures as the cause of abnormal reaction of the patient, or of later complication, without mention of misadventure at the time of the procedure: Secondary | ICD-10-CM | POA: Diagnosis not present

## 2020-01-21 DIAGNOSIS — G9341 Metabolic encephalopathy: Secondary | ICD-10-CM | POA: Diagnosis present

## 2020-01-21 DIAGNOSIS — R1084 Generalized abdominal pain: Secondary | ICD-10-CM

## 2020-01-21 DIAGNOSIS — Z66 Do not resuscitate: Secondary | ICD-10-CM | POA: Diagnosis present

## 2020-01-21 DIAGNOSIS — I10 Essential (primary) hypertension: Secondary | ICD-10-CM | POA: Diagnosis present

## 2020-01-21 DIAGNOSIS — R4182 Altered mental status, unspecified: Secondary | ICD-10-CM | POA: Diagnosis present

## 2020-01-21 DIAGNOSIS — E43 Unspecified severe protein-calorie malnutrition: Secondary | ICD-10-CM | POA: Diagnosis present

## 2020-01-21 DIAGNOSIS — I4892 Unspecified atrial flutter: Secondary | ICD-10-CM | POA: Diagnosis present

## 2020-01-21 DIAGNOSIS — C679 Malignant neoplasm of bladder, unspecified: Secondary | ICD-10-CM | POA: Diagnosis present

## 2020-01-21 DIAGNOSIS — G8929 Other chronic pain: Secondary | ICD-10-CM | POA: Diagnosis present

## 2020-01-21 DIAGNOSIS — F419 Anxiety disorder, unspecified: Secondary | ICD-10-CM | POA: Diagnosis present

## 2020-01-21 DIAGNOSIS — E538 Deficiency of other specified B group vitamins: Secondary | ICD-10-CM | POA: Diagnosis present

## 2020-01-21 DIAGNOSIS — Z7401 Bed confinement status: Secondary | ICD-10-CM

## 2020-01-21 DIAGNOSIS — Z681 Body mass index (BMI) 19 or less, adult: Secondary | ICD-10-CM | POA: Diagnosis not present

## 2020-01-21 DIAGNOSIS — H9190 Unspecified hearing loss, unspecified ear: Secondary | ICD-10-CM | POA: Diagnosis present

## 2020-01-21 DIAGNOSIS — M1990 Unspecified osteoarthritis, unspecified site: Secondary | ICD-10-CM | POA: Diagnosis present

## 2020-01-21 DIAGNOSIS — R32 Unspecified urinary incontinence: Secondary | ICD-10-CM

## 2020-01-21 DIAGNOSIS — N390 Urinary tract infection, site not specified: Secondary | ICD-10-CM | POA: Diagnosis present

## 2020-01-21 DIAGNOSIS — K56699 Other intestinal obstruction unspecified as to partial versus complete obstruction: Secondary | ICD-10-CM | POA: Diagnosis present

## 2020-01-21 DIAGNOSIS — D473 Essential (hemorrhagic) thrombocythemia: Secondary | ICD-10-CM | POA: Diagnosis present

## 2020-01-21 DIAGNOSIS — D75839 Thrombocytosis, unspecified: Secondary | ICD-10-CM | POA: Diagnosis present

## 2020-01-21 DIAGNOSIS — N136 Pyonephrosis: Secondary | ICD-10-CM | POA: Diagnosis present

## 2020-01-21 DIAGNOSIS — L89150 Pressure ulcer of sacral region, unstageable: Secondary | ICD-10-CM

## 2020-01-21 DIAGNOSIS — L89152 Pressure ulcer of sacral region, stage 2: Secondary | ICD-10-CM | POA: Diagnosis present

## 2020-01-21 DIAGNOSIS — N12 Tubulo-interstitial nephritis, not specified as acute or chronic: Secondary | ICD-10-CM

## 2020-01-21 DIAGNOSIS — K219 Gastro-esophageal reflux disease without esophagitis: Secondary | ICD-10-CM | POA: Diagnosis present

## 2020-01-21 DIAGNOSIS — Z933 Colostomy status: Secondary | ICD-10-CM

## 2020-01-21 DIAGNOSIS — Z7189 Other specified counseling: Secondary | ICD-10-CM | POA: Diagnosis not present

## 2020-01-21 DIAGNOSIS — L8990 Pressure ulcer of unspecified site, unspecified stage: Secondary | ICD-10-CM | POA: Diagnosis present

## 2020-01-21 DIAGNOSIS — M169 Osteoarthritis of hip, unspecified: Secondary | ICD-10-CM | POA: Diagnosis present

## 2020-01-21 DIAGNOSIS — Z87440 Personal history of urinary (tract) infections: Secondary | ICD-10-CM

## 2020-01-21 DIAGNOSIS — A419 Sepsis, unspecified organism: Secondary | ICD-10-CM | POA: Diagnosis present

## 2020-01-21 DIAGNOSIS — R339 Retention of urine, unspecified: Secondary | ICD-10-CM | POA: Diagnosis present

## 2020-01-21 DIAGNOSIS — Z809 Family history of malignant neoplasm, unspecified: Secondary | ICD-10-CM

## 2020-01-21 DIAGNOSIS — D72829 Elevated white blood cell count, unspecified: Secondary | ICD-10-CM | POA: Diagnosis present

## 2020-01-21 DIAGNOSIS — Z515 Encounter for palliative care: Secondary | ICD-10-CM | POA: Diagnosis not present

## 2020-01-21 DIAGNOSIS — M5416 Radiculopathy, lumbar region: Secondary | ICD-10-CM | POA: Diagnosis present

## 2020-01-21 DIAGNOSIS — Z906 Acquired absence of other parts of urinary tract: Secondary | ICD-10-CM

## 2020-01-21 DIAGNOSIS — J9811 Atelectasis: Secondary | ICD-10-CM | POA: Diagnosis present

## 2020-01-21 DIAGNOSIS — Z79899 Other long term (current) drug therapy: Secondary | ICD-10-CM

## 2020-01-21 DIAGNOSIS — D509 Iron deficiency anemia, unspecified: Secondary | ICD-10-CM | POA: Diagnosis present

## 2020-01-21 LAB — CBC WITH DIFFERENTIAL/PLATELET
Abs Immature Granulocytes: 3.7 10*3/uL — ABNORMAL HIGH (ref 0.00–0.07)
Basophils Absolute: 0 10*3/uL (ref 0.0–0.1)
Basophils Relative: 0 %
Eosinophils Absolute: 0.3 10*3/uL (ref 0.0–0.5)
Eosinophils Relative: 0 %
HCT: 32.3 % — ABNORMAL LOW (ref 39.0–52.0)
Hemoglobin: 10.4 g/dL — ABNORMAL LOW (ref 13.0–17.0)
Immature Granulocytes: 4 %
Lymphocytes Relative: 3 %
Lymphs Abs: 2.9 10*3/uL (ref 0.7–4.0)
MCH: 29.1 pg (ref 26.0–34.0)
MCHC: 32.2 g/dL (ref 30.0–36.0)
MCV: 90.5 fL (ref 80.0–100.0)
Monocytes Absolute: 2.7 10*3/uL — ABNORMAL HIGH (ref 0.1–1.0)
Monocytes Relative: 3 %
Neutro Abs: 81.7 10*3/uL — ABNORMAL HIGH (ref 1.7–7.7)
Neutrophils Relative %: 90 %
Platelets: 610 10*3/uL — ABNORMAL HIGH (ref 150–400)
RBC: 3.57 MIL/uL — ABNORMAL LOW (ref 4.22–5.81)
RDW: 15.3 % (ref 11.5–15.5)
Smear Review: NORMAL
WBC: 91.3 10*3/uL (ref 4.0–10.5)
nRBC: 0 % (ref 0.0–0.2)

## 2020-01-21 LAB — COMPREHENSIVE METABOLIC PANEL
ALT: 8 U/L (ref 0–44)
AST: 11 U/L — ABNORMAL LOW (ref 15–41)
Albumin: 2.3 g/dL — ABNORMAL LOW (ref 3.5–5.0)
Alkaline Phosphatase: 161 U/L — ABNORMAL HIGH (ref 38–126)
Anion gap: 11 (ref 5–15)
BUN: 36 mg/dL — ABNORMAL HIGH (ref 8–23)
CO2: 22 mmol/L (ref 22–32)
Calcium: 9.4 mg/dL (ref 8.9–10.3)
Chloride: 97 mmol/L — ABNORMAL LOW (ref 98–111)
Creatinine, Ser: 1.07 mg/dL (ref 0.61–1.24)
GFR calc Af Amer: >60 mL/min (ref >60)
GFR calc non Af Amer: >60 mL/min (ref >60)
Glucose, Bld: 104 mg/dL — ABNORMAL HIGH (ref 70–99)
Potassium: 3.7 mmol/L (ref 3.5–5.1)
Sodium: 130 mmol/L — ABNORMAL LOW (ref 135–145)
Total Bilirubin: 0.9 mg/dL (ref 0.3–1.2)
Total Protein: 6.3 g/dL — ABNORMAL LOW (ref 6.5–8.1)

## 2020-01-21 LAB — URINALYSIS, COMPLETE (UACMP) WITH MICROSCOPIC
Bilirubin Urine: NEGATIVE
Glucose, UA: NEGATIVE mg/dL
Ketones, ur: NEGATIVE mg/dL
Nitrite: NEGATIVE
Protein, ur: 100 mg/dL — AB
RBC / HPF: >50 RBC/hpf — ABNORMAL HIGH (ref 0–5)
Specific Gravity, Urine: 1.013 (ref 1.005–1.030)
WBC, UA: >50 WBC/hpf — ABNORMAL HIGH (ref 0–5)
pH: 5 (ref 5.0–8.0)

## 2020-01-21 LAB — PROTIME-INR
INR: 1.1 (ref 0.8–1.2)
Prothrombin Time: 14.2 seconds (ref 11.4–15.2)

## 2020-01-21 LAB — APTT: aPTT: 47 seconds — ABNORMAL HIGH (ref 24–36)

## 2020-01-21 LAB — SARS CORONAVIRUS 2 (TAT 6-24 HRS): SARS Coronavirus 2: NEGATIVE

## 2020-01-21 LAB — PROCALCITONIN: Procalcitonin: 1.08 ng/mL

## 2020-01-21 LAB — LACTIC ACID, PLASMA: Lactic Acid, Venous: 1.9 mmol/L (ref 0.5–1.9)

## 2020-01-21 LAB — LIPASE, BLOOD: Lipase: 15 U/L (ref 11–51)

## 2020-01-21 MED ORDER — ACETAMINOPHEN 325 MG PO TABS: 650.0000 mg | ORAL_TABLET | Freq: Four times a day (QID) | ORAL | Status: DC | PRN

## 2020-01-21 MED ORDER — OXYCODONE HCL 5 MG PO TABS
10.0000 mg | ORAL_TABLET | ORAL | Status: DC | PRN
  Administered 2020-01-21 – 2020-01-26 (×7): 10 mg via ORAL
  Filled 2020-01-21 (×7): qty 2

## 2020-01-21 MED ORDER — IOHEXOL 300 MG/ML  SOLN
100.0000 mL | Freq: Once | INTRAMUSCULAR | Status: AC | PRN
  Administered 2020-01-21: 100 mL via INTRAVENOUS

## 2020-01-21 MED ORDER — ENOXAPARIN SODIUM 40 MG/0.4ML ~~LOC~~ SOLN
40.0000 mg | SUBCUTANEOUS | Status: DC
  Administered 2020-01-21 – 2020-01-24 (×4): 40 mg via SUBCUTANEOUS
  Filled 2020-01-21 (×4): qty 0.4

## 2020-01-21 MED ORDER — LORATADINE 10 MG PO TABS
10.0000 mg | ORAL_TABLET | Freq: Every day | ORAL | Status: DC
  Administered 2020-01-21 – 2020-01-25 (×4): 10 mg via ORAL
  Filled 2020-01-21 (×5): qty 1

## 2020-01-21 MED ORDER — TRAZODONE HCL 50 MG PO TABS: 25.0000 mg | ORAL_TABLET | Freq: Every evening | ORAL | Status: DC | PRN

## 2020-01-21 MED ORDER — ADULT MULTIVITAMIN W/MINERALS CH
1.0000 | ORAL_TABLET | Freq: Every day | ORAL | Status: DC
  Administered 2020-01-21 – 2020-01-25 (×4): 1 via ORAL
  Filled 2020-01-21 (×5): qty 1

## 2020-01-21 MED ORDER — ONDANSETRON HCL 4 MG/2ML IJ SOLN: 4.0000 mg | Freq: Four times a day (QID) | INTRAMUSCULAR | Status: DC | PRN

## 2020-01-21 MED ORDER — PROCHLORPERAZINE MALEATE 10 MG PO TABS
10.0000 mg | ORAL_TABLET | Freq: Four times a day (QID) | ORAL | Status: DC | PRN
  Filled 2020-01-21: qty 1

## 2020-01-21 MED ORDER — SODIUM CHLORIDE 0.9 % IV SOLN
2.0000 g | Freq: Two times a day (BID) | INTRAVENOUS | Status: DC
  Administered 2020-01-22 – 2020-01-25 (×9): 2 g via INTRAVENOUS
  Filled 2020-01-21 (×11): qty 2

## 2020-01-21 MED ORDER — FERROUS SULFATE 325 (65 FE) MG PO TABS
325.0000 mg | ORAL_TABLET | Freq: Two times a day (BID) | ORAL | Status: DC
  Administered 2020-01-21 – 2020-01-25 (×9): 325 mg via ORAL
  Filled 2020-01-21 (×11): qty 1

## 2020-01-21 MED ORDER — SODIUM CHLORIDE 0.9 % IV SOLN: 2.0000 g | Freq: Two times a day (BID) | INTRAVENOUS | Status: DC

## 2020-01-21 MED ORDER — TRAMADOL HCL 50 MG PO TABS
50.0000 mg | ORAL_TABLET | Freq: Four times a day (QID) | ORAL | Status: DC | PRN
  Administered 2020-01-22 – 2020-01-25 (×4): 50 mg via ORAL
  Filled 2020-01-21 (×4): qty 1

## 2020-01-21 MED ORDER — VANCOMYCIN HCL IN DEXTROSE 1-5 GM/200ML-% IV SOLN
1000.0000 mg | Freq: Once | INTRAVENOUS | Status: AC
  Administered 2020-01-21: 13:00:00 1000 mg via INTRAVENOUS
  Filled 2020-01-21: qty 200

## 2020-01-21 MED ORDER — VITAMIN B-12 1000 MCG PO TABS
500.0000 ug | ORAL_TABLET | Freq: Every day | ORAL | Status: DC
  Administered 2020-01-21 – 2020-01-25 (×5): 500 ug via ORAL
  Filled 2020-01-21: qty 0.5
  Filled 2020-01-21 (×5): qty 1

## 2020-01-21 MED ORDER — SODIUM CHLORIDE 0.9 % IV SOLN
2.0000 g | Freq: Once | INTRAVENOUS | Status: AC
  Administered 2020-01-21: 2 g via INTRAVENOUS
  Filled 2020-01-21: qty 2

## 2020-01-21 MED ORDER — OXYCODONE HCL ER 10 MG PO T12A
10.0000 mg | EXTENDED_RELEASE_TABLET | Freq: Two times a day (BID) | ORAL | Status: DC
  Administered 2020-01-21 – 2020-01-25 (×8): 10 mg via ORAL
  Filled 2020-01-21 (×9): qty 1

## 2020-01-21 MED ORDER — MIRABEGRON ER 50 MG PO TB24
50.0000 mg | ORAL_TABLET | Freq: Every day | ORAL | Status: DC
  Administered 2020-01-21 – 2020-01-25 (×4): 50 mg via ORAL
  Filled 2020-01-21 (×7): qty 1

## 2020-01-21 MED ORDER — ACETAMINOPHEN 650 MG RE SUPP: 650.0000 mg | Freq: Four times a day (QID) | RECTAL | Status: DC | PRN

## 2020-01-21 MED ORDER — METRONIDAZOLE IN NACL 5-0.79 MG/ML-% IV SOLN
500.0000 mg | Freq: Once | INTRAVENOUS | Status: AC
  Administered 2020-01-21: 500 mg via INTRAVENOUS
  Filled 2020-01-21: qty 100

## 2020-01-21 MED ORDER — BISACODYL 5 MG PO TBEC: 5.0000 mg | DELAYED_RELEASE_TABLET | Freq: Every day | ORAL | Status: DC | PRN

## 2020-01-21 MED ORDER — LACTATED RINGERS IV BOLUS (SEPSIS)
1000.0000 mL | Freq: Once | INTRAVENOUS | Status: AC
  Administered 2020-01-21: 13:00:00 1000 mL via INTRAVENOUS

## 2020-01-21 MED ORDER — LACTATED RINGERS IV BOLUS (SEPSIS)
1000.0000 mL | Freq: Once | INTRAVENOUS | Status: AC
  Administered 2020-01-21: 1000 mL via INTRAVENOUS

## 2020-01-21 MED ORDER — LIDOCAINE-PRILOCAINE 2.5-2.5 % EX CREA
1.0000 "application " | TOPICAL_CREAM | CUTANEOUS | Status: DC | PRN
  Filled 2020-01-21: qty 5

## 2020-01-21 MED ORDER — ENSURE ENLIVE PO LIQD
237.0000 mL | Freq: Three times a day (TID) | ORAL | Status: DC
  Administered 2020-01-21 – 2020-01-25 (×12): 237 mL via ORAL

## 2020-01-21 MED ORDER — MAGNESIUM CITRATE PO SOLN
1.0000 | Freq: Once | ORAL | Status: DC | PRN
  Filled 2020-01-21: qty 296

## 2020-01-21 MED ORDER — COLLAGENASE 250 UNIT/GM EX OINT
TOPICAL_OINTMENT | Freq: Every day | CUTANEOUS | Status: DC
  Filled 2020-01-21: qty 30

## 2020-01-21 MED ORDER — ONDANSETRON HCL 4 MG PO TABS: 4.0000 mg | ORAL_TABLET | Freq: Four times a day (QID) | ORAL | Status: DC | PRN

## 2020-01-21 MED ORDER — POLYETHYLENE GLYCOL 3350 17 G PO PACK: 17.0000 g | PACK | Freq: Every day | ORAL | Status: DC | PRN

## 2020-01-21 MED ORDER — SODIUM CHLORIDE 0.9 % IV SOLN
INTRAVENOUS | Status: AC
  Administered 2020-01-21: 14:00:00 1000 mL via INTRAVENOUS

## 2020-01-21 NOTE — ED Provider Notes (Addendum)
[facility] Emergency Department Provider Note  ____________________________________________  Time seen: Approximately 2:19 PM  I have reviewed the triage vital signs and the nursing notes.   HISTORY  Chief Complaint Altered Mental Status    Level 5 Caveat: Portions of the History and Physical including HPI and review of systems are unable to be completely obtained due to patient acute confusion  HPI Vincent Poole is a 85 y.o. male with a history of bladder cancer, GERD, hypertension, abdominal wall fistula, malignant bowel obstruction with colostomy who comes to the ED  due to confusion at home.  Has fever.  No other history available at this time.     Past Medical History:  Diagnosis Date  . Anxiety   . Arthritis   . Bladder cancer ([place])   . Dysrhythmia 07/2018   history of atrial flutter that worsens with anxiety  . Femur fracture, right ([place]) 05/01/2018  . GERD (gastroesophageal reflux disease)   . History of recent blood transfusion 05/2018  . Hypertension   . Iron deficiency anemia 09/14/2018  . Prostate cancer ([place]) 07/2018   cancer growing in prostate but not prostate cancer, it is from the bladder  . Umbilical hernia 26/3335  . Urinary retention 2019   foley catheter place 11/2017  . UTI (urinary tract infection) 2019   frequent UTI's over last year  . Wound eschar of foot 07/2018   left heal getting wrapped and requiring antibiotic cream. cracks open with weight bearing.     Patient Active Problem List   Diagnosis Date Noted  . Sepsis secondary to UTI ([place]) 01/21/2020  . Abdominal wall cellulitis 11/23/2019  . Intra-abdominal abscess ([place])   . Intra-abdominal infection 10/05/2019  . S/P partial resection of colon 07/17/2019  . Protein-calorie malnutrition, severe 06/11/2019  . Abdominal pain, generalized   . Palliative care by specialist   . Urothelial cancer ([place])   . Weakness   . Large bowel obstruction ([place]) 06/06/2019   . Hydronephrosis, right 04/08/2019  . Pressure injury of skin 03/23/2019  . SIRS (systemic inflammatory response syndrome) ([place]) 03/22/2019  . Leukocytosis 02/21/2019  . Attention to nephrostomy ([place]) 11/22/2018  . Complicated UTI (urinary tract infection) 11/14/2018  . Palliative care encounter   . Iron deficiency anemia secondary to inadequate dietary iron intake 06/27/2018  . Vitamin B12 deficiency 06/27/2018  . History of fracture of right hip 06/26/2018  . History of normocytic normochromic anemia 06/26/2018  . Personal history of bladder cancer 06/26/2018  . Hip fracture ([place]) 05/01/2018  . Malignant neoplasm of urinary bladder ([place]) 01/12/2018  . Goals of care, counseling/discussion 01/12/2018  . History of recurrent UTIs 08/03/2017  . Pyelonephritis 05/31/2017  . UTI (urinary tract infection) 03/04/2017  . Urinary obstruction   . Essential hypertension 08/30/2016  . History of shingles 08/30/2016  . Prostate cancer ([place]) 08/30/2016  . Urinary retention 08/30/2016  . Medicare annual wellness visit, initial 08/01/2016  . Medicare annual wellness visit, subsequent 08/01/2016  . Sepsis ([place]) 07/11/2016  . Borderline diabetes mellitus 01/26/2016  . Vaccine counseling 01/26/2015  . Lumbar radiculitis 06/24/2014  . OA (osteoarthritis) of hip 06/24/2014  . Malignant neoplasm of prostate ([place]) 01/27/2011     Past Surgical History:  Procedure Laterality Date  . CARPAL TUNNEL RELEASE Right   . CHOLECYSTECTOMY  2004  . COLOSTOMY N/A 06/08/2019   Procedure: COLOSTOMY;  Surgeon: Herbert Pun, MD;  Location: [facility];  Service: General;  Laterality: N/A;  . CYSTOGRAM  07/18/2018  Procedure: CYSTOGRAM;  Surgeon: [name], MD;  Location: [facility];  Service: Urology;;  . Consuela Mimes W/ RETROGRADES Bilateral 07/18/2018   Procedure: CYSTOSCOPY WITH RETROGRADE PYELOGRAM;  Surgeon: [name], MD;  Location: [facility];  Service: Urology;  Laterality: Bilateral;  .  CYSTOSCOPY W/ URETERAL STENT PLACEMENT Bilateral 12/27/2017   Procedure: CYSTOSCOPY WITH RETROGRADE PYELOGRAM/URETERAL STENT PLACEMENT;  Surgeon: [name], MD;  Location: [facility];  Service: Urology;  Laterality: Bilateral;  . CYSTOSCOPY W/ URETERAL STENT PLACEMENT Bilateral 07/18/2018   Procedure: CYSTOSCOPY WITH STENT REPLACEMENT (exchange);  Surgeon: [name], MD;  Location: [facility];  Service: Urology;  Laterality: Bilateral;  . CYSTOSCOPY WITH STENT PLACEMENT Bilateral 11/12/2018   Procedure: [place] WITH STENT Exchange;  Surgeon: [name], MD;  Location: [facility];  Service: Urology;  Laterality: Bilateral;  . INTRAMEDULLARY (IM) NAIL INTERTROCHANTERIC Right 05/02/2018   Procedure: INTRAMEDULLARY (IM) NAIL INTERTROCHANTRIC;  Surgeon: [name], MD;  Location: [facility];  Service: Orthopedics;  Laterality: Right;  . IR NEPHROSTOMY EXCHANGE LEFT  03/25/2019  . IR NEPHROSTOMY EXCHANGE LEFT  04/19/2019  . IR NEPHROSTOMY EXCHANGE LEFT  05/31/2019  . IR NEPHROSTOMY EXCHANGE LEFT  07/19/2019  . IR NEPHROSTOMY EXCHANGE LEFT  09/13/2019  . IR NEPHROSTOMY EXCHANGE LEFT  11/22/2019  . IR NEPHROSTOMY EXCHANGE LEFT  01/03/2020  . IR NEPHROSTOMY EXCHANGE RIGHT  03/25/2019  . IR NEPHROSTOMY EXCHANGE RIGHT  04/19/2019  . IR NEPHROSTOMY EXCHANGE RIGHT  05/31/2019  . IR NEPHROSTOMY EXCHANGE RIGHT  07/19/2019  . IR NEPHROSTOMY EXCHANGE RIGHT  09/13/2019  . IR NEPHROSTOMY EXCHANGE RIGHT  11/22/2019  . IR NEPHROSTOMY EXCHANGE RIGHT  01/03/2020  . IR NEPHROSTOMY PLACEMENT LEFT  02/23/2019  . IR NEPHROSTOMY PLACEMENT RIGHT  02/23/2019  . LAPAROTOMY N/A 06/08/2019   Procedure: EXPLORATORY LAPAROTOMY;  Surgeon: Herbert Pun, MD;  Location: [facility];  Service: General;  Laterality: N/A;  . LEG TENDON SURGERY Right 1958  . PORTA CATH INSERTION N/A 01/22/2018   Procedure: PORTA CATH INSERTION;  Surgeon: [name], MD;  Location: [place] [facility];  Service: Cardiovascular;  Laterality: N/A;   . TRANSURETHRAL RESECTION OF BLADDER TUMOR N/A 12/27/2017   Procedure: TRANSURETHRAL RESECTION OF BLADDER TUMOR (TURBT);  Surgeon: [name], MD;  Location: [facility];  Service: Urology;  Laterality: N/A;  . TRANSURETHRAL RESECTION OF BLADDER TUMOR N/A 01/15/2018   Procedure: TRANSURETHRAL RESECTION OF BLADDER TUMOR (TURBT);  Surgeon: [name], MD;  Location: [facility];  Service: Urology;  Laterality: N/A;  Need 2 hrs for this case please  . TRANSURETHRAL RESECTION OF BLADDER TUMOR N/A 07/18/2018   Procedure: TRANSURETHRAL RESECTION OF BLADDER TUMOR (TURBT);  Surgeon: [name], MD;  Location: [facility];  Service: Urology;  Laterality: N/A;     Prior to Admission medications   Medication Sig Start Date End Date Taking? Authorizing Provider  Cranberry-Cholecalciferol (SUPER CRANBERRY/VITAMIN D3) 4200-500 MG-UNIT CAPS Take 1 capsule by mouth 2 (two) times daily.    Yes [provider]  feeding supplement, ENSURE ENLIVE, (ENSURE ENLIVE) LIQD Take 237 mLs by mouth 2 (two) times daily between meals. Patient taking differently: Take 237 mLs by mouth 3 (three) times daily between meals.  06/11/19  Yes Bettey Costa, MD  ferrous sulfate 325 (65 FE) MG tablet Take 1 tablet (325 mg total) by mouth 2 (two) times daily with a meal. 05/06/18  Yes Gouru, Aruna, MD  lidocaine-prilocaine (EMLA) cream Apply 1 application topically as needed. 01/02/19  Yes Sindy Guadeloupe, MD  mirabegron  ER (MYRBETRIQ) 50 MG TB24 tablet Take 1 tablet (50 mg total) by mouth daily. 12/03/19  Yes Billey Co, MD  MONOJECT PREFILL ADVANCED NACL 0.9 % SOLN injection Inject 10 mLs into the vein daily. 01/06/20  Yes [provider]  Multiple Vitamin (MULTIVITAMIN WITH MINERALS) TABS tablet Take 1 tablet by mouth daily.   Yes [provider]  Oxycodone HCl 10 MG TABS Take 1 tablet (10 mg total) by mouth every 4 (four) hours as needed. 01/20/20 02/19/20 Yes Sindy Guadeloupe, MD  polyethylene glycol powder  (MIRALAX) powder Take 17 g by mouth daily as needed. Can increase to 3 times a day as needed for constipation but hold medication if has diarrhea 01/30/18  Yes Sindy Guadeloupe, MD  vitamin B-12 (CYANOCOBALAMIN) 500 MCG tablet Take 500 mcg by mouth daily.   Yes [provider]  acetaminophen (TYLENOL) 500 MG tablet Take 1,000 mg by mouth every 6 (six) hours as needed for moderate pain or headache.     [provider]  cetirizine (ZYRTEC) 10 MG tablet Take 10 mg by mouth daily as needed for allergies.     [provider]  ciprofloxacin (CIPRO) 500 MG tablet Take 1 tablet (500 mg total) by mouth 2 (two) times daily. Patient not taking: Reported on 01/21/2020 02/13/20   [name], MD  nystatin [facility]) powder Apply topically 2 (two) times daily. Patient not taking: Reported on 12/02/2019 09/28/18   Zara Council A, PA-C  ondansetron (ZOFRAN) 8 MG tablet Take 1 tablet (8 mg total) by mouth 2 (two) times daily as needed for refractory nausea / vomiting. Start on day 3 after chemo. Patient not taking: Reported on 12/02/2019 08/01/19   Sindy Guadeloupe, MD  prochlorperazine (COMPAZINE) 10 MG tablet Take 1 tablet (10 mg total) by mouth every 6 (six) hours as needed (Nausea or vomiting). Patient not taking: Reported on 11/11/2019 08/01/19   Sindy Guadeloupe, MD  traZODone (DESYREL) 50 MG tablet Take 0.5 tablets (25 mg total) by mouth at bedtime as needed for sleep. Patient not taking: Reported on 12/02/2019 11/26/19   Nicole Kindred A, DO     Allergies Patient has no known allergies.   Family History  Problem Relation Age of Onset  . Cancer Mother   . Chronic Renal Failure Mother   . Heart disease Father     Social History Social History   Tobacco Use  . Smoking status: Never Smoker  . Smokeless tobacco: Never Used  Substance Use Topics  . Alcohol use: No    Alcohol/week: 0.0 standard drinks  . Drug use: No    Review of Systems Level 5 Caveat: Portions of the  History and Physical including HPI and review of systems are unable to be completely obtained due to patient being a poor historian   Constitutional: Positive fever .  ENT:   No rhinorrhea. Cardiovascular:   No chest pain or syncope. Respiratory:   No dyspnea or cough. Gastrointestinal:   Negative for abdominal pain, vomiting and diarrhea.  Musculoskeletal:   Negative for focal pain or swelling ____________________________________________   PHYSICAL EXAM:  VITAL SIGNS: ED Triage Vitals  Enc Vitals Group     BP 01/21/20 1206 100/72     Pulse Rate 01/21/20 1206 (!) 108     Resp 01/21/20 1206 18     Temp 01/21/20 1206 (!) 100.9 F (38.3 C)     Temp Source 01/21/20 1206 Oral     SpO2 01/21/20 1206  97 %     Weight 01/21/20 1207 133 lb 3.2 oz (60.4 kg)     Height 01/21/20 1207 6' (1.829 m)     Head Circumference --      Peak Flow --      Pain Score --      Pain Loc --      Pain Edu? --      Excl. in [place]? --     Vital signs reviewed, nursing assessments reviewed.   Constitutional:   Awake, not alert, not oriented.  Ill-appearing. Eyes:   Conjunctivae are normal. EOMI. PERRL. ENT      Head:   Normocephalic and atraumatic.      Nose:   No congestion/rhinnorhea.       Mouth/Throat:   Dry mucous membranes, no pharyngeal erythema. No peritonsillar mass.       Neck:   No meningismus. Full ROM. Hematological/Lymphatic/Immunilogical:   No cervical lymphadenopathy. Cardiovascular:   Tachycardia heart rate 110. Symmetric bilateral radial and DP pulses.  No murmurs. Cap refill less than 2 seconds. Respiratory:   Normal respiratory effort without tachypnea/retractions. Breath sounds are clear and equal bilaterally. No wheezes/rales/rhonchi. Gastrointestinal:   Soft with generalized tenderness. Non distended.  Bilateral nephrostomy tubes appear to be in place with suture anchors.  Large epigastric colostomy.  Necrotic appearing tissue abdominal wall fistula Musculoskeletal:   Normal range  of motion in all extremities. No joint effusions.  No lower extremity tenderness.  No edema. Neurologic:   Mumbling speech, minimal language expression Motor grossly intact. Able to follow simple commands. Skin:    Skin is warm, dry with sacral decubitus ulcer. No rash noted.  No petechiae, purpura, or bullae.  ____________________________________________    LABS (pertinent positives/negatives) (all labs ordered are listed, but only abnormal results are displayed) Labs Reviewed  COMPREHENSIVE METABOLIC PANEL - Abnormal; Notable for the following components:      Result Value   Sodium 130 (*)    Chloride 97 (*)    Glucose, Bld 104 (*)    BUN 36 (*)    Total Protein 6.3 (*)    Albumin 2.3 (*)    AST 11 (*)    Alkaline Phosphatase 161 (*)    All other components within normal limits  CBC WITH DIFFERENTIAL/PLATELET - Abnormal; Notable for the following components:   WBC 91.3 (*)    RBC 3.57 (*)    Hemoglobin 10.4 (*)    HCT 32.3 (*)    Platelets 610 (*)    Neutro Abs 81.7 (*)    Monocytes Absolute 2.7 (*)    Abs Immature Granulocytes 3.70 (*)    All other components within normal limits  APTT - Abnormal; Notable for the following components:   aPTT 47 (*)    All other components within normal limits  URINALYSIS, COMPLETE (UACMP) WITH MICROSCOPIC - Abnormal; Notable for the following components:   Color, Urine AMBER (*)    APPearance TURBID (*)    Hgb urine dipstick MODERATE (*)    Protein, ur 100 (*)    Leukocytes,Ua LARGE (*)    RBC / HPF >50 (*)    WBC, UA >50 (*)    Bacteria, UA MANY (*)    All other components within normal limits  CULTURE, BLOOD (ROUTINE X 2)  CULTURE, BLOOD (ROUTINE X 2)  URINE CULTURE  LACTIC ACID, PLASMA  LIPASE, BLOOD  PROCALCITONIN  PROTIME-INR  CBC  CREATININE, SERUM   ____________________________________________   EKG  Interpreted  by me Sinus tachycardia rate 107.  Right axis, normal intervals.  Normal QRS ST segments and T  waves.  ____________________________________________    RADIOLOGY  DG Chest Port 1 View  Result Date: 01/21/2020 CLINICAL DATA:  Altered mental status.  Oral temperature 100.9 EXAM: PORTABLE CHEST 1 VIEW COMPARISON:  Moderate-sized right pleural effusion which may be FINDINGS: Unchanged position of a right chest infusion port catheter. Heart size within normal limits. Aortic atherosclerosis. New from prior examination, there is a moderate-sized right pleural effusion which may be partially loculated. Associated right basilar opacity. Diffuse chronic prominence of the interstitial lung markings. The left lung is otherwise clear. No evidence of pneumothorax. No acute bony abnormality. Thoracic spondylosis. IMPRESSION: New moderate-sized right pleural effusion which may be partially loculated. Right basilar atelectasis. Pneumonia at the right lung base cannot be excluded. Aortic atherosclerosis. Electronically Signed   By: Kellie Simmering DO   On: 01/21/2020 13:24    ____________________________________________   PROCEDURES .Critical Care Performed by: Carrie Mew, MD Authorized by: Carrie Mew, MD   Critical care provider statement:    Critical care time (minutes):  35   Critical care time was exclusive of:  Separately billable procedures and treating other patients   Critical care was necessary to treat or prevent imminent or life-threatening deterioration of the following conditions:  Sepsis   Critical care was time spent personally by me on the following activities:  Development of treatment plan with patient or surrogate, discussions with consultants, evaluation of patient's response to treatment, examination of patient, obtaining history from patient or surrogate, ordering and performing treatments and interventions, ordering and review of laboratory studies, ordering and review of radiographic studies, pulse oximetry, re-evaluation of patient's condition and review of old  charts    ____________________________________________  DIFFERENTIAL DIAGNOSIS   UTI, pyelonephritis, bowel perforation, obstruction, pneumonia, sepsis, intra-abdominal abscess  CLINICAL IMPRESSION / ASSESSMENT AND PLAN / ED COURSE  Medications ordered in the ED: Medications  collagenase (SANTYL) ointment ( Topical Not Given 01/21/20 1421)  enoxaparin (LOVENOX) injection 40 mg (has no administration in time range)  0.9 %  sodium chloride infusion (has no administration in time range)  ceFEPIme (MAXIPIME) 2 g in sodium chloride 0.9 % 100 mL IVPB (has no administration in time range)  acetaminophen (TYLENOL) tablet 650 mg (has no administration in time range)    Or  acetaminophen (TYLENOL) suppository 650 mg (has no administration in time range)  traMADol (ULTRAM) tablet 50 mg (has no administration in time range)  traZODone (DESYREL) tablet 25 mg (has no administration in time range)  polyethylene glycol (MIRALAX / GLYCOLAX) packet 17 g (has no administration in time range)  bisacodyl (DULCOLAX) EC tablet 5 mg (has no administration in time range)  magnesium citrate solution 1 Bottle (has no administration in time range)  ondansetron (ZOFRAN) tablet 4 mg (has no administration in time range)    Or  ondansetron (ZOFRAN) injection 4 mg (has no administration in time range)  lactated ringers bolus 1,000 mL (0 mLs Intravenous Stopped 01/21/20 1335)    And  lactated ringers bolus 1,000 mL (0 mLs Intravenous Stopped 01/21/20 1335)  ceFEPIme (MAXIPIME) 2 g in sodium chloride 0.9 % 100 mL IVPB (0 g Intravenous Stopped 01/21/20 1320)  metroNIDAZOLE (FLAGYL) IVPB 500 mg (0 mg Intravenous Stopped 01/21/20 1419)  vancomycin (VANCOCIN) IVPB 1000 mg/200 mL premix (0 mg Intravenous Stopped 01/21/20 1348)  iohexol (OMNIPAQUE) 300 MG/ML solution 100 mL (100 mLs Intravenous Contrast Given 01/21/20 1352)  Pertinent labs & imaging results that were available during my care of the patient were reviewed by  me and considered in my medical decision making (see chart for details).   Vincent Poole was evaluated in Emergency Department on 01/21/2020 for the symptoms described in the history of present illness. He was evaluated in the context of the global COVID-19 pandemic, which necessitated consideration that the patient might be at risk for infection with the SARS-CoV-2 virus that causes COVID-19. Institutional protocols and algorithms that pertain to the evaluation of patients at risk for COVID-19 are in a state of rapid change based on information released by regulatory bodies including the CDC and federal and state organizations. These policies and algorithms were followed during the patient's care in the ED.   Patient presents with fever tachycardia, suspected sepsis.  Code sepsis initiated, broad-spectrum antibiotics with cefepime and Flagyl for intra-abdominal infection.  Appears dehydrated.  2 L IV fluid bolus ordered for hydration and resuscitation.  Mental status changes appear to be encephalopathy related to acute medical illness.  Doubt meningitis encephalitis or intracranial hemorrhage or stroke  Clinical Course as of Jan 20 1425  Tue Jan 21, 2020  1344 Labs show severe leukocytosis, likely UTI from urine sample collected from nephrostomy tube.  Lactate is normal, creatinine is normal.  Elevated platelets as well, consistent with severe sepsis.  Cultures obtained, antibiotics given.  Fluids given.  Will consult hospitalist for further management.  Palliative care consult ordered.  Chest x-ray also shows a right-sided pleural effusion, but patient is not having any respiratory distress at this time.   [PS]    Clinical Course User Index [PS] Carrie Mew, MD     ____________________________________________   FINAL CLINICAL IMPRESSION(S) / ED DIAGNOSES    Final diagnoses:  Severe sepsis ([place])  Pyelonephritis  Acute metabolic encephalopathy  Pressure injury of sacral region,  stage 2 [facility])     ED Discharge Orders    None      Portions of this note were generated with dragon dictation software. Dictation errors may occur despite best attempts at proofreading.   Carrie Mew, MD 01/21/20 1426    Carrie Mew, MD 01/21/20 1427

## 2020-01-21 NOTE — ED Notes (Addendum)
Wound care was done on patient arrival to ER. Sacral wound changed, pouch and wound in center of abdomen cleaned and changed on arrival. Ostomy wound cleansed. Will use wound nurse orders for next dressing change due. Pt in discomfort when changed ostomy wound to center of abdomen that has foul odor. Bilateral nephrostomy tubes dressing clean.

## 2020-01-21 NOTE — Consult Note (Signed)
[place] Nurse ostomy consult note Stoma type/location: LUQ colostomy Cancerous lesion to bladder with cellulitis to anterior abdominal wall. Pouching with urostomy pouch.  Odor noted.  Bilateral nephrostomy tubes.  Checking patency via MD orders. Stomal assessment/size: pink and moist colostomy with 2 3/4" pouch. Producing firm brown stool Milky liquid from cancerous abdominal lesion Peristomal assessment: pouch intact Treatment options for stomal/peristomal skin: colostomy 2 3/4" 2 piece pouch with barrier ring Abdominal lesion:  1piece convex urostomy pouch ([facility] # [id number]) with barrier ring Output  See above Ostomy pouching: 1pc.urostomy and 2 piece colostomy with barrier rings Education provided: spoke with bedside RN in ED WOC Nurse Consult Note: Reason for Consult: Unstageable sacral pressure injury present on admission. Wound type:unstageable pressure injury Pressure Injury POA: Yes Measurement: Bedside RN to measure Wound bed:100% dry eschar Drainage (amount, consistency, odor) none noted  Necrotic odor Periwound: intact Dressing procedure/placement/frequency: Cleanse sacral wound with NS and pat dry. APPLY Santyl to wound bed.  Cover with NS moist gauze Secure with ABD pad and tape.  Change daily.  [place] team will follow.  Domenic Moras MSN, RN, FNP-BC CWON Wound, Ostomy, Continence Nurse Pager [phone]

## 2020-01-21 NOTE — H&P (Signed)
History and Physical    Vincent Poole QAS:[id number] DOB: 02-02-1935 DOA: 01/21/2020  PCP: [name], MD  Patient coming from: Home  I have personally briefly reviewed patient's old medical records in [place]  Chief Complaint: fever, confused, not eating  HPI: Vincent Poole is a 85 y.o. male with medical history significant of 85 year old male with history of locally invasive bladder cancer with bilateral nephrostsomies, colostomy and fistula history abdominal wall also covered with ostomy bag,prostate cancer, hypertension, iron deficiency anemia, GERD.  He presented to the ED today accompanied by his wife with altered mental status, poor p.o. intake and increased pain above his baseline.  Patient is extremely hard of hearing, so most history was obtained by patient's wife at bedside.  She noticed patient seemed unwell on Saturday when he slept all day.  Also reports poor p.o. intake the last 2 days, and confusion that started this morning.  Wife reports his right nephrostomy tube has been leaking a lot and the surrounding skin has become red and irritated looking.  He also has apparently had more pain recently, wife stating he would not let her touch him to change bags.  States it took 4 hours to get him ready to come to the hospital today, they changed both stoma bags before coming in, he did not tolerate well due to pain.  Wife denies any fevers at home but patient has been very cold.  He has had some nausea but no vomiting.  No respiratory symptoms including cough or congestion or sore throat.  Patient's only complaint during encounter is pain, pointing to his left lower back.  Wife reports previously when he was mostly complaining of pain on the right.  Patient became tearful during encounter but was unable to express why, only stated he is usually not this emotional.  Could not tell me specifically why he is tearful, but wife thinks due to pain and feeling sick.  ED  Course: Temp 100.9, heart rate 108, intermittently tachypneic, initial blood pressure 100/72 improved to 116/54 with fluids.  Labs notable for white count 91.3k (has baseline leukocytosis usually in the 20s).  Lactic acid 1.9.  Pro-Cal 1.08.  Urinalysis grossly positive for infection.  Chest x-ray showed a new moderate-sized right pleural effusion that may be partially loculated with right basilar atelectasis versus infiltrate.  Patient met sepsis criteria and was treated with IV fluids and broad-spectrum antibiotics.  Admitted to hospitalist service for further evaluation and management with palliative care consulted.  Review of Systems: As per HPI otherwise 10 point review of systems negative.    Past Medical History:  Diagnosis Date  . Anxiety   . Arthritis   . Bladder cancer ([place])   . Dysrhythmia 07/2018   history of atrial flutter that worsens with anxiety  . Femur fracture, right ([place]) 05/01/2018  . GERD (gastroesophageal reflux disease)   . History of recent blood transfusion 05/2018  . Hypertension   . Iron deficiency anemia 09/14/2018  . Prostate cancer ([place]) 07/2018   cancer growing in prostate but not prostate cancer, it is from the bladder  . Umbilical hernia 79/8921  . Urinary retention 2019   foley catheter place 11/2017  . UTI (urinary tract infection) 2019   frequent UTI's over last year  . Wound eschar of foot 07/2018   left heal getting wrapped and requiring antibiotic cream. cracks open with weight bearing.    Past Surgical History:  Procedure Laterality Date  . CARPAL TUNNEL  RELEASE Right   . CHOLECYSTECTOMY  2004  . COLOSTOMY N/A 06/08/2019   Procedure: COLOSTOMY;  Surgeon: Herbert Pun, MD;  Location: [facility];  Service: General;  Laterality: N/A;  . CYSTOGRAM  07/18/2018   Procedure: CYSTOGRAM;  Surgeon: [name], MD;  Location: [facility];  Service: Urology;;  . Consuela Mimes W/ RETROGRADES Bilateral 07/18/2018   Procedure: CYSTOSCOPY WITH RETROGRADE  PYELOGRAM;  Surgeon: [name], MD;  Location: [facility];  Service: Urology;  Laterality: Bilateral;  . CYSTOSCOPY W/ URETERAL STENT PLACEMENT Bilateral 12/27/2017   Procedure: CYSTOSCOPY WITH RETROGRADE PYELOGRAM/URETERAL STENT PLACEMENT;  Surgeon: [name], MD;  Location: [facility];  Service: Urology;  Laterality: Bilateral;  . CYSTOSCOPY W/ URETERAL STENT PLACEMENT Bilateral 07/18/2018   Procedure: CYSTOSCOPY WITH STENT REPLACEMENT (exchange);  Surgeon: [name], MD;  Location: [facility];  Service: Urology;  Laterality: Bilateral;  . CYSTOSCOPY WITH STENT PLACEMENT Bilateral 11/12/2018   Procedure: [place] WITH STENT Exchange;  Surgeon: [name], MD;  Location: [facility];  Service: Urology;  Laterality: Bilateral;  . INTRAMEDULLARY (IM) NAIL INTERTROCHANTERIC Right 05/02/2018   Procedure: INTRAMEDULLARY (IM) NAIL INTERTROCHANTRIC;  Surgeon: [name], MD;  Location: [facility];  Service: Orthopedics;  Laterality: Right;  . IR NEPHROSTOMY EXCHANGE LEFT  03/25/2019  . IR NEPHROSTOMY EXCHANGE LEFT  04/19/2019  . IR NEPHROSTOMY EXCHANGE LEFT  05/31/2019  . IR NEPHROSTOMY EXCHANGE LEFT  07/19/2019  . IR NEPHROSTOMY EXCHANGE LEFT  09/13/2019  . IR NEPHROSTOMY EXCHANGE LEFT  11/22/2019  . IR NEPHROSTOMY EXCHANGE LEFT  01/03/2020  . IR NEPHROSTOMY EXCHANGE RIGHT  03/25/2019  . IR NEPHROSTOMY EXCHANGE RIGHT  04/19/2019  . IR NEPHROSTOMY EXCHANGE RIGHT  05/31/2019  . IR NEPHROSTOMY EXCHANGE RIGHT  07/19/2019  . IR NEPHROSTOMY EXCHANGE RIGHT  09/13/2019  . IR NEPHROSTOMY EXCHANGE RIGHT  11/22/2019  . IR NEPHROSTOMY EXCHANGE RIGHT  01/03/2020  . IR NEPHROSTOMY PLACEMENT LEFT  02/23/2019  . IR NEPHROSTOMY PLACEMENT RIGHT  02/23/2019  . LAPAROTOMY N/A 06/08/2019   Procedure: EXPLORATORY LAPAROTOMY;  Surgeon: Herbert Pun, MD;  Location: [facility];  Service: General;  Laterality: N/A;  . LEG TENDON SURGERY Right 1958  . PORTA CATH INSERTION N/A 01/22/2018   Procedure: PORTA CATH  INSERTION;  Surgeon: [name], MD;  Location: [facility];  Service: Cardiovascular;  Laterality: N/A;  . TRANSURETHRAL RESECTION OF BLADDER TUMOR N/A 12/27/2017   Procedure: TRANSURETHRAL RESECTION OF BLADDER TUMOR (TURBT);  Surgeon: [name], MD;  Location: [facility];  Service: Urology;  Laterality: N/A;  . TRANSURETHRAL RESECTION OF BLADDER TUMOR N/A 01/15/2018   Procedure: TRANSURETHRAL RESECTION OF BLADDER TUMOR (TURBT);  Surgeon: [name], MD;  Location: [facility];  Service: Urology;  Laterality: N/A;  Need 2 hrs for this case please  . TRANSURETHRAL RESECTION OF BLADDER TUMOR N/A 07/18/2018   Procedure: TRANSURETHRAL RESECTION OF BLADDER TUMOR (TURBT);  Surgeon: [name], MD;  Location: [facility];  Service: Urology;  Laterality: N/A;     reports that he has never smoked. He has never used smokeless tobacco. He reports that he does not drink alcohol or use drugs.  No Known Allergies  Family History  Problem Relation Age of Onset  . Cancer Mother   . Chronic Renal Failure Mother   . Heart disease Father     Prior to Admission medications   Medication Sig Start Date End Date Taking? Authorizing Provider  Cranberry-Cholecalciferol (SUPER CRANBERRY/VITAMIN D3) 4200-500 MG-UNIT CAPS Take 1 capsule by mouth 2 (two) times  daily.    Yes [provider]  feeding supplement, ENSURE ENLIVE, (ENSURE ENLIVE) LIQD Take 237 mLs by mouth 2 (two) times daily between meals. Patient taking differently: Take 237 mLs by mouth 3 (three) times daily between meals.  06/11/19  Yes Bettey Costa, MD  ferrous sulfate 325 (65 FE) MG tablet Take 1 tablet (325 mg total) by mouth 2 (two) times daily with a meal. 05/06/18  Yes Gouru, Aruna, MD  lidocaine-prilocaine (EMLA) cream Apply 1 application topically as needed. 01/02/19  Yes Sindy Guadeloupe, MD  mirabegron ER (MYRBETRIQ) 50 MG TB24 tablet Take 1 tablet (50 mg total) by mouth daily. 12/03/19  Yes Billey Co, MD  MONOJECT  PREFILL ADVANCED NACL 0.9 % SOLN injection Inject 10 mLs into the vein daily. 01/06/20  Yes [provider]  Multiple Vitamin (MULTIVITAMIN WITH MINERALS) TABS tablet Take 1 tablet by mouth daily.   Yes [provider]  Oxycodone HCl 10 MG TABS Take 1 tablet (10 mg total) by mouth every 4 (four) hours as needed. 01/20/20 02/19/20 Yes Sindy Guadeloupe, MD  polyethylene glycol powder (MIRALAX) powder Take 17 g by mouth daily as needed. Can increase to 3 times a day as needed for constipation but hold medication if has diarrhea 01/30/18  Yes Sindy Guadeloupe, MD  vitamin B-12 (CYANOCOBALAMIN) 500 MCG tablet Take 500 mcg by mouth daily.   Yes [provider]  acetaminophen (TYLENOL) 500 MG tablet Take 1,000 mg by mouth every 6 (six) hours as needed for moderate pain or headache.     [provider]  cetirizine (ZYRTEC) 10 MG tablet Take 10 mg by mouth daily as needed for allergies.     [provider]  ciprofloxacin (CIPRO) 500 MG tablet Take 1 tablet (500 mg total) by mouth 2 (two) times daily. Patient not taking: Reported on 01/21/2020 02/13/20   [name], MD  nystatin [facility]) powder Apply topically 2 (two) times daily. Patient not taking: Reported on 12/02/2019 09/28/18   Zara Council A, PA-C  ondansetron (ZOFRAN) 8 MG tablet Take 1 tablet (8 mg total) by mouth 2 (two) times daily as needed for refractory nausea / vomiting. Start on day 3 after chemo. Patient not taking: Reported on 12/02/2019 08/01/19   Sindy Guadeloupe, MD  prochlorperazine (COMPAZINE) 10 MG tablet Take 1 tablet (10 mg total) by mouth every 6 (six) hours as needed (Nausea or vomiting). Patient not taking: Reported on 11/11/2019 08/01/19   Sindy Guadeloupe, MD  traZODone (DESYREL) 50 MG tablet Take 0.5 tablets (25 mg total) by mouth at bedtime as needed for sleep. Patient not taking: Reported on 12/02/2019 11/26/19   Ezekiel Slocumb, DO    Physical Exam: Vitals:   01/21/20 1230 01/21/20 1330  01/21/20 1445 01/21/20 1500  BP: 108/70 (!) 116/54 103/64 (!) 99/59  Pulse: (!) 114 (!) 102 (!) 109 (!) 106  Resp: (!) 27 18 (!) 21 18  Temp:      TempSrc:      SpO2: 98% 100% 100% 100%  Weight:      Height:        Constitutional: NAD, calm, comfortable, chronically ill appearing Eyes: EOMI, lids and conjunctivae normal ENMT: severe hearing impairment, mucous membranes are moist, oropharynx without exudates.  Respiratory: Decreased breath sounds but clear bilaterally, no wheezing, no crackles. Normal respiratory effort. No accessory muscle use.  Cardiovascular: RRR, no murmurs / rubs / gallops. No extremity edema. 2+ pedal pulses.  Abdomen: soft, +  Bowel sounds, nondistended, midline colostomy bag with brown stool, lower abdominal wall fistula with ostomy bag overlying it Genitourinary: Bilateral nephrostomy tubes in place, skin surrounding the right nephrostomy tube is erythematous, unable to appreciate fluctuance or induration due to patient's pain limiting exam Musculoskeletal: no clubbing / cyanosis. No joint deformity upper and lower extremities. Normal muscle tone.  Skin: dry, intact, normal color, warm to touch Neurologic: CN 2-12 grossly intact. Normal speech.  Grossly non-focal exam. Psychiatric: Alert and oriented x 3.  Depressed mood, tearful.  Unable to assess judgment and insight due to hearing impairment limiting communication.   Labs on Admission: I have personally reviewed following labs and imaging studies  CBC: Recent Labs  Lab 01/21/20 1221  WBC 91.3*  NEUTROABS 81.7*  HGB 10.4*  HCT 32.3*  MCV 90.5  PLT 937*   Basic Metabolic Panel: Recent Labs  Lab 01/21/20 1221  NA 130*  K 3.7  CL 97*  CO2 22  GLUCOSE 104*  BUN 36*  CREATININE 1.07  CALCIUM 9.4   GFR: Estimated Creatinine Clearance: 45.5 mL/min (by C-G formula based on SCr of 1.07 mg/dL). Liver Function Tests: Recent Labs  Lab 01/21/20 1221  AST 11*  ALT 8  ALKPHOS 161*  BILITOT 0.9   PROT 6.3*  ALBUMIN 2.3*   Recent Labs  Lab 01/21/20 1221  LIPASE 15   No results for input(s): AMMONIA in the last 168 hours. Coagulation Profile: Recent Labs  Lab 01/21/20 1221  INR 1.1   Cardiac Enzymes: No results for input(s): CKTOTAL, CKMB, CKMBINDEX, TROPONINI in the last 168 hours. BNP (last 3 results) No results for input(s): PROBNP in the last 8760 hours. HbA1C: No results for input(s): HGBA1C in the last 72 hours. CBG: No results for input(s): GLUCAP in the last 168 hours. Lipid Profile: No results for input(s): CHOL, HDL, LDLCALC, TRIG, CHOLHDL, LDLDIRECT in the last 72 hours. Thyroid Function Tests: No results for input(s): TSH, T4TOTAL, FREET4, T3FREE, THYROIDAB in the last 72 hours. Anemia Panel: No results for input(s): VITAMINB12, FOLATE, FERRITIN, TIBC, IRON, RETICCTPCT in the last 72 hours. Urine analysis:    Component Value Date/Time   COLORURINE AMBER (A) 01/21/2020 1221   APPEARANCEUR TURBID (A) 01/21/2020 1221   APPEARANCEUR Cloudy (A) 10/23/2018 1327   LABSPEC 1.013 01/21/2020 1221   PHURINE 5.0 01/21/2020 1221   GLUCOSEU NEGATIVE 01/21/2020 1221   HGBUR MODERATE (A) 01/21/2020 1221   BILIRUBINUR NEGATIVE 01/21/2020 1221   BILIRUBINUR Negative 10/23/2018 1327   KETONESUR NEGATIVE 01/21/2020 1221   PROTEINUR 100 (A) 01/21/2020 1221   NITRITE NEGATIVE 01/21/2020 1221   LEUKOCYTESUR LARGE (A) 01/21/2020 1221    Radiological Exams on Admission: CT ABDOMEN PELVIS W CONTRAST  Result Date: 01/21/2020 CLINICAL DATA:  history of bladder and prostate cancer. EXAM: CT ABDOMEN AND PELVIS WITH CONTRAST TECHNIQUE: Multidetector CT imaging of the abdomen and pelvis was performed using the standard protocol following bolus administration of intravenous contrast. CONTRAST:  149m OMNIPAQUE IOHEXOL 300 MG/ML  SOLN COMPARISON:  11/23/2019 FINDINGS: Lower chest: Small bilateral pleural effusions are again noted and appears slightly increased in volume from  previous exam. Progressive masslike architectural distortion overlying the right lower lobe is favored to represent rounded atelectasis. Hepatobiliary: Cholecystectomy. No focal liver abnormality. No biliary ductal dilatation. Pancreas: Unremarkable. No pancreatic ductal dilatation or surrounding inflammatory changes. Spleen: Spleen measures 11.4 cm in length. No focal splenic abnormality Adrenals/Urinary Tract: Normal appearance of the adrenal glands. Bilateral percutaneous nephrostomy tubes are in place. No right-sided  hydronephrosis. Similar appearance of mild left hydronephrosis. Unchanged appearance of right kidney cyst. Large complex necrotic mass arising from the region of the bladder and prostate is again noted. This measures 13.9 x 9.8 by 10.3 cm (volume = 730 cm^3). Previously (by my measurements) this measures 12.2 x 9.2 by 11.3 cm (volume = 660 cm^3). Stomach/Bowel: Stomach is within normal limits. Appendix appears normal. No evidence of bowel wall thickening, distention, or inflammatory changes. Transverse colostomy is identified along the midline of abdomen. Vascular/Lymphatic: Aortic atherosclerosis. No aneurysm. No abdominopelvic adenopathy. Reproductive: The large pelvic mass is directly contiguous and inseparable from the prostate gland. Other: No significant free fluid or fluid collections. Musculoskeletal: Previous ORIF of right proximal femur. No acute or suspicious bone abnormality. IMPRESSION: 1. Interval increase in volume of large complex necrotic tumor arising from the region of the bladder and prostate gland. 2. Bilateral percutaneous nephrostomy tubes are in place. Similar appearance of mild left hydronephrosis. 3. Small bilateral pleural effusions are slightly increased in volume from previous exam. 4. Progressive masslike architectural distortion overlying the right lower lobe is favored to represent rounded atelectasis. Attention on follow-up imaging advised. Aortic Atherosclerosis  (ICD10-I70.0). Electronically Signed   By: Kerby Moors M.D.   On: 01/21/2020 14:33   DG Chest Port 1 View  Result Date: 01/21/2020 CLINICAL DATA:  Altered mental status.  Oral temperature 100.9 EXAM: PORTABLE CHEST 1 VIEW COMPARISON:  Moderate-sized right pleural effusion which may be FINDINGS: Unchanged position of a right chest infusion port catheter. Heart size within normal limits. Aortic atherosclerosis. New from prior examination, there is a moderate-sized right pleural effusion which may be partially loculated. Associated right basilar opacity. Diffuse chronic prominence of the interstitial lung markings. The left lung is otherwise clear. No evidence of pneumothorax. No acute bony abnormality. Thoracic spondylosis. IMPRESSION: New moderate-sized right pleural effusion which may be partially loculated. Right basilar atelectasis. Pneumonia at the right lung base cannot be excluded. Aortic atherosclerosis. Electronically Signed   By: Kellie Simmering DO   On: 01/21/2020 13:24    EKG: Independently reviewed.  Sinus tachycardia at 107 bpm, right axis deviation, no ST segment elevation.  Assessment/Plan Principal Problem:   Sepsis secondary to UTI [facility]) Active Problems:   Malignant neoplasm of urinary bladder (HCC)   Essential hypertension   Hyponatremia   Thrombocytosis ([facility])   Vitamin B12 deficiency   Lumbar radiculitis   Malignant neoplasm of prostate ([facility])   OA (osteoarthritis) of hip   Complicated UTI (urinary tract infection)   Leukocytosis   Pressure injury of skin   Abdominal pain, generalized   Protein-calorie malnutrition, severe   History of recurrent UTIs    Sepsis secondary to complicated UTI vs ?Cellulitis History of recurrent UTI's Sepsis present on admission with leukocytosis, fever, tachycardia and UA consistent with infection.  Chest x-ray could not rule out right lower infiltrate due to effusion with atelectasis but patient is without respiratory symptoms.  He has  some erythema and irritation of the skin surrounding his right nephrostomy tube, possible developing cellulitis.  Fluid resuscitated and broad-spectrum antibiotics initiated in the ED.  Initial lactic acid 1.9.  No evidence of endorgan damage or septic shock. --Continue IV hydration --Vancomycin and cefepime to cover for possible cellulitis and complicated UTI --Covid swab pending --Zofran as needed nausea --Follow-up repeat lactic acid --Follow-up blood and urine cultures  Malignant neoplasm of urinary bladder, locally invasive with abdominal wall fistula, bilateral nephrostomies, colostomy. Malignant neoplasm of prostate  Generalized abdominal pain -secondary  to his bladder cancer and abdominal wall involvement Oncology has exhausted all treatment options for patient's cancer.  Palliative care and hospice have been recommended on multiple occasions and patient wife have declined so far.  Oncology was consulted during patient's January admission and reported nothing more to offer, supportive care only at this point. --Palliative consulted --Pain management: continue home oxycodone 10 mg every 4 hours as needed    start OxyContin 10 mg twice daily for baseline pain coverage --Adjust pain medications as needed for patient's comfort --Wound care consulted for ostomy management  Hyponatremia -sodium 130 on admission, likely due to poor p.o. intake and hypovolemia. --Expect improvement with IV hydration --Recheck BMP in the morning --Further work-up if not improving  Thrombocytosis -suspect reactive, platelets 610k on admission --Monitor CBC  Leukocytosis -has chronic leukocytosis usually in the 20ks, presented with white count of 91.3k. --Treat infection as above and monitor CBC  Essential hypertension -appears no longer be on medications at home and blood pressure soft on presentation.  Chronic pain secondary to locally advanced malignancy, lumbar radiculitis and osteoarthritis  (hip) --Pain management as above --Palliative consult  Pressure injury of skin -present on admission --Wound care consulted  Protein-calorie malnutrition, severe --Dietitian to follow --Continue multivitamin  Hearing impairment -patient is extremely hard of hearing.   --Hears best on the right side, but need to be very close and speak very loudly   DVT prophylaxis: Lovenox Code Status: DNR Family Communication: Wife at bedside during admission encounter provided history, updated with the plan and in agreement, all questions answered. Disposition Plan: Likely patient will discharge home pending clinical improvement.  Palliative consult pending, consider hospice with discharge if patient and wife agreeable. Consults called: Palliative care Admission status: Inpatient   Ezekiel Slocumb, DO [facility]   If 7PM-7AM, please contact night-coverage [email or web address]  01/21/2020, 3:24 PM

## 2020-01-21 NOTE — Consult Note (Signed)
PHARMACY -  BRIEF ANTIBIOTIC NOTE   Pharmacy has received consult(s) for Cefepime/Metronidazole from an ED provider.  The patient's profile has been reviewed for ht/wt/allergies/indication/available labs.    One time order(s) placed for Cefepime 2g IV x1, Vancomycin 1000mg  IV x 1  Further antibiotics/pharmacy consults should be ordered by admitting physician if indicated.                       Thank you,  Lu Duffel, PharmD, BCPS Clinical Pharmacist 01/21/2020 12:32 PM

## 2020-01-21 NOTE — ED Notes (Signed)
Pt resting comfortably in bed with eyes closed. Wife at bedside, attentive to patient needs.

## 2020-01-21 NOTE — ED Triage Notes (Signed)
To ED via [facility] from home due to [place]. Pt oral temp 100.9 other VS WNL. Pt has bilateral nephrostomy tubes, right appears to be dislodged. Pt also has ostomy X2 with brown stool in bag of one. Sacral ulcer on arrival, dressing changed. Foul odor.

## 2020-01-21 NOTE — ED Notes (Signed)
Wife currently assisting patient to eat.

## 2020-01-21 NOTE — ED Notes (Signed)
Urine grossly contaminated from nephrostomy tube drainage bags. EDP aware.

## 2020-01-21 NOTE — Consult Note (Signed)
Pharmacy Antibiotic Note  Vincent Poole is a 85 y.o. male admitted on 01/21/2020 with UTI/sepsis.  Pharmacy has been consulted for Cefepime dosing.  Plan: Pt received Cefepime 2g x 1 in the ED,  Will start Cefepime 2g q12h  Height: 6' (182.9 cm) Weight: 133 lb 3.2 oz (60.4 kg) IBW/kg (Calculated) : 77.6  Temp (24hrs), Avg:100.9 F (38.3 C), Min:100.9 F (38.3 C), Max:100.9 F (38.3 C)  Recent Labs  Lab 01/21/20 1221  WBC 91.3*  CREATININE 1.07  LATICACIDVEN 1.9    Estimated Creatinine Clearance: 45.5 mL/min (by C-G formula based on SCr of 1.07 mg/dL).    No Known Allergies  Antimicrobials this admission: 3/16 Cefepime/Metronidazole/Vancomycin x 1  3/16 Cefepime >>   Dose adjustments this admission: none  Microbiology results: 03/16 BCx: pending 03/16 UCx: pending   Thank you for allowing pharmacy to be a part of this patient's care.  Lu Duffel, PharmD, BCPS Clinical Pharmacist 01/21/2020 2:27 PM

## 2020-01-21 NOTE — Consult Note (Signed)
CODE SEPSIS - PHARMACY COMMUNICATION  **Broad Spectrum Antibiotics should be administered within 1 hour of Sepsis diagnosis**  Time Code Sepsis Called/Page Received: 1220  Antibiotics Ordered: Vancomycin/Cefepime/Metronidazole  Time of 1st antibiotic administration: 1238  Additional action taken by pharmacy: None  If necessary, Name of Provider/Nurse Contacted: Ellis, PharmD, BCPS Clinical Pharmacist 01/21/2020 1:07 PM

## 2020-01-21 NOTE — ED Notes (Signed)
Pt ate 50% of meal tray

## 2020-01-21 NOTE — ED Notes (Addendum)
Date and time results received: 01/21/20 .1340 (use smartphrase ".now" to insert current time)  Test: WBC  Critical Value: 91.3  Name of Provider Notified: Joni Fears

## 2020-01-21 NOTE — ED Notes (Addendum)
Report to Jackelyn Poling, RN Pt sent to floor with transport. DNR sent with patient.

## 2020-01-22 DIAGNOSIS — N390 Urinary tract infection, site not specified: Secondary | ICD-10-CM

## 2020-01-22 DIAGNOSIS — Z7189 Other specified counseling: Secondary | ICD-10-CM

## 2020-01-22 DIAGNOSIS — C679 Malignant neoplasm of bladder, unspecified: Secondary | ICD-10-CM

## 2020-01-22 DIAGNOSIS — A419 Sepsis, unspecified organism: Principal | ICD-10-CM

## 2020-01-22 DIAGNOSIS — Z515 Encounter for palliative care: Secondary | ICD-10-CM

## 2020-01-22 LAB — CBC
HCT: 27.9 % — ABNORMAL LOW (ref 39.0–52.0)
Hemoglobin: 8.9 g/dL — ABNORMAL LOW (ref 13.0–17.0)
MCH: 28.8 pg (ref 26.0–34.0)
MCHC: 31.9 g/dL (ref 30.0–36.0)
MCV: 90.3 fL (ref 80.0–100.0)
Platelets: 415 10*3/uL — ABNORMAL HIGH (ref 150–400)
RBC: 3.09 MIL/uL — ABNORMAL LOW (ref 4.22–5.81)
RDW: 15.5 % (ref 11.5–15.5)
WBC: 73.2 10*3/uL (ref 4.0–10.5)
nRBC: 0 % (ref 0.0–0.2)

## 2020-01-22 LAB — BASIC METABOLIC PANEL
Anion gap: 5 (ref 5–15)
BUN: 29 mg/dL — ABNORMAL HIGH (ref 8–23)
CO2: 25 mmol/L (ref 22–32)
Calcium: 8.9 mg/dL (ref 8.9–10.3)
Chloride: 105 mmol/L (ref 98–111)
Creatinine, Ser: 0.86 mg/dL (ref 0.61–1.24)
GFR calc Af Amer: >60 mL/min (ref >60)
GFR calc non Af Amer: >60 mL/min (ref >60)
Glucose, Bld: 93 mg/dL (ref 70–99)
Potassium: 3.2 mmol/L — ABNORMAL LOW (ref 3.5–5.1)
Sodium: 135 mmol/L (ref 135–145)

## 2020-01-22 LAB — TSH: TSH: 5.589 u[IU]/mL — ABNORMAL HIGH (ref 0.350–4.500)

## 2020-01-22 LAB — PROTIME-INR
INR: 1.2 (ref 0.8–1.2)
Prothrombin Time: 15.1 seconds (ref 11.4–15.2)

## 2020-01-22 LAB — PROCALCITONIN: Procalcitonin: 1.43 ng/mL

## 2020-01-22 LAB — MAGNESIUM: Magnesium: 1.3 mg/dL — ABNORMAL LOW (ref 1.7–2.4)

## 2020-01-22 LAB — CORTISOL-AM, BLOOD: Cortisol - AM: 14 ug/dL (ref 6.7–22.6)

## 2020-01-22 MED ORDER — POTASSIUM CHLORIDE CRYS ER 20 MEQ PO TBCR
40.0000 meq | EXTENDED_RELEASE_TABLET | Freq: Once | ORAL | Status: AC
  Administered 2020-01-22: 40 meq via ORAL
  Filled 2020-01-22: qty 2

## 2020-01-22 MED ORDER — SODIUM CHLORIDE 0.45 % IV SOLN: INTRAVENOUS | Status: DC

## 2020-01-22 MED ORDER — VANCOMYCIN HCL IN DEXTROSE 1-5 GM/200ML-% IV SOLN
1000.0000 mg | INTRAVENOUS | Status: DC
  Administered 2020-01-22 – 2020-01-25 (×4): 1000 mg via INTRAVENOUS
  Filled 2020-01-22 (×5): qty 200

## 2020-01-22 MED ORDER — CHLORHEXIDINE GLUCONATE CLOTH 2 % EX PADS
6.0000 | MEDICATED_PAD | Freq: Every day | CUTANEOUS | Status: DC
  Administered 2020-01-22 – 2020-01-25 (×4): 6 via TOPICAL

## 2020-01-22 MED ORDER — MAGNESIUM SULFATE 4 GM/100ML IV SOLN
4.0000 g | Freq: Once | INTRAVENOUS | Status: AC
  Administered 2020-01-22: 11:00:00 4 g via INTRAVENOUS
  Filled 2020-01-22 (×2): qty 100

## 2020-01-22 NOTE — Consult Note (Signed)
Pharmacy Antibiotic Note  Vincent Poole is a 85 y.o. male admitted on 01/21/2020 with UTI/sepsis.  Pharmacy has been consulted for Vancomycin dosing.  Plan: Will continu Cefepime 2g q12h  Pt received 1000mg  IV Vancomycin 3/16 @ 7564  Will start: Vancomycin 1000 mg IV Q 24 hrs.  Goal AUC 400-550. Expected AUC: 447 SCr used: 0.86   Height: 6' (182.9 cm) Weight: 133 lb 3.2 oz (60.4 kg) IBW/kg (Calculated) : 77.6  Temp (24hrs), Avg:98.2 F (36.8 C), Min:97.4 F (36.3 C), Max:100.9 F (38.3 C)  Recent Labs  Lab 01/21/20 1221 01/22/20 0531  WBC 91.3* 73.2*  CREATININE 1.07 0.86  LATICACIDVEN 1.9  --     Estimated Creatinine Clearance: 56.6 mL/min (by C-G formula based on SCr of 0.86 mg/dL).    No Known Allergies  Antimicrobials this admission: 3/16 Cefepime/Metronidazole/Vancomycin x 1  3/16 Cefepime >>  3/16 Vancomycin >>  Dose adjustments this admission: none  Microbiology results: 03/16 BCx: pending 03/16 UCx: pending   Thank you for allowing pharmacy to be a part of this patient's care.  Lu Duffel, PharmD, BCPS Clinical Pharmacist 01/22/2020 9:26 AM

## 2020-01-22 NOTE — Consult Note (Signed)
Consultation Note Date: 01/22/2020   Patient Name: Vincent Poole  DOB: [birth year]  MRN: [id number]  Age / Sex: 85 y.o., male  PCP: [name], MD Referring Physician: Nolberto Hanlon, MD  Reason for Consultation: Establishing goals of care  HPI/Patient Profile: 85 y.o. male  with past medical history of locally invasive bladder cancer with bilateral nephrostomy tubes, prostate cancer, HTN, anemia, GERD, abdominal wall fistula, and colostomy admitted on 01/21/2020 with AMS and poor PO intake. Patient is being treated for sepsis secondary to UTI vs cellulitis. He has a history of recurrent UTIs. Patient has been seen by palliative care both inpatient and outpatient multiple times. PMT consulted for [place].  Clinical Assessment and Goals of Care: I have reviewed medical records including EPIC notes, labs and imaging, assessed the patient and then met with patient's wife, [name],  to discuss diagnosis prognosis, GOC, EOL wishes, disposition and options.  I introduced Palliative Medicine as specialized medical care for people living with serious illness. It focuses on providing relief from the symptoms and stress of a serious illness. The goal is to improve quality of life for both the patient and the family.  As far as functional and nutritional status, wife tells me patient has been bedbound recently. Was able to get in wheelchair and get around house independently. Needed assistance with ADLs. Okay appetite. Some increased confusion recently.     We discussed his current illness and what it means in the larger context of his on-going co-morbidities.  Natural disease trajectory and expectations at EOL were discussed.  Hospice and Palliative Care services outpatient were explained and offered. Wife is very familiar with palliative care outpatient and is interested in transitioning to hospice care.  Questions and concerns were addressed. The family was  encouraged to call with questions or concerns.   Primary Decision Maker NEXT OF KIN - wife - patient confused - decisional when returns to baseline  SUMMARY OF RECOMMENDATIONS   - wife interested in transition to hospice care at home -PMT will follow/be available for support  Code Status/Advance Care Planning:  DNR   Prognosis:   < 6 months  Discharge Planning: Home with Hospice      Primary Diagnoses: Present on Admission: . Sepsis secondary to UTI ([place]) . Abdominal pain, generalized . Essential hypertension . Complicated UTI (urinary tract infection) . History of recurrent UTIs . Leukocytosis . Malignant neoplasm of prostate ([place]) . Malignant neoplasm of urinary bladder ([place]) . Pressure injury of skin . OA (osteoarthritis) of hip . Lumbar radiculitis . Protein-calorie malnutrition, severe . Vitamin B12 deficiency . Hyponatremia . Thrombocytosis ([place])   I have reviewed the medical record, interviewed the patient and family, and examined the patient. The following aspects are pertinent.  Past Medical History:  Diagnosis Date  . Anxiety   . Arthritis   . Bladder cancer ([place])   . Dysrhythmia 07/2018   history of atrial flutter that worsens with anxiety  . Femur fracture, right ([place]) 05/01/2018  . GERD (gastroesophageal reflux disease)   . History of recent blood transfusion 05/2018  . Hypertension   . Iron deficiency anemia 09/14/2018  . Prostate cancer ([place]) 07/2018   cancer growing in prostate but not prostate cancer, it is from the bladder  . Umbilical hernia 81/1914  . Urinary retention 2019   foley catheter place 11/2017  . UTI (urinary tract infection) 2019   frequent UTI's over last year  . Wound eschar of foot 07/2018   left heal getting  wrapped and requiring antibiotic cream. cracks open with weight bearing.   Social History   Socioeconomic History  . Marital status: Married    Spouse name: ruth  . Number of children: Not on file  . Years of  education: Not on file  . Highest education level: Not on file  Occupational History  . Occupation: retired    Comment: Therapist, [employer]  Tobacco Use  . Smoking status: Never Smoker  . Smokeless tobacco: Never Used  Substance and Sexual Activity  . Alcohol use: No    Alcohol/week: 0.0 standard drinks  . Drug use: No  . Sexual activity: Not Currently  Other Topics Concern  . Not on file  Social History Narrative  . Not on file   Social Determinants of Health   Financial Resource Strain: Unknown  . Difficulty of Paying Living Expenses: Patient refused  Food Insecurity: Unknown  . Worried About Charity fundraiser in the Last Year: Patient refused  . Ran Out of Food in the Last Year: Patient refused  Transportation Needs: Unknown  . Lack of Transportation (Medical): Patient refused  . Lack of Transportation (Non-Medical): Patient refused  Physical Activity: Unknown  . Days of Exercise per Week: Patient refused  . Minutes of Exercise per Session: Patient refused  Stress: Unknown  . Feeling of Stress : Patient refused  Social Connections: Unknown  . Frequency of Communication with Friends and Family: Patient refused  . Frequency of Social Gatherings with Friends and Family: Patient refused  . Attends Religious Services: Patient refused  . Active Member of Clubs or Organizations: Patient refused  . Attends Archivist Meetings: Patient refused  . Marital Status: Patient refused   Family History  Problem Relation Age of Onset  . Cancer Mother   . Chronic Renal Failure Mother   . Heart disease Father    Scheduled Meds: . Chlorhexidine Gluconate Cloth  6 each Topical Daily  . collagenase   Topical Daily  . enoxaparin (LOVENOX) injection  40 mg Subcutaneous Q24H  . feeding supplement (ENSURE ENLIVE)  237 mL Oral TID BM  . ferrous sulfate  325 mg Oral BID WC  . loratadine  10 mg Oral Daily  . mirabegron ER  50 mg Oral Daily  . multivitamin with minerals  1 tablet  Oral Daily  . oxyCODONE  10 mg Oral Q12H  . vitamin B-12  500 mcg Oral Daily   Continuous Infusions: . ceFEPime (MAXIPIME) IV 200 mL/hr at 01/22/20 1500  . vancomycin Stopped (01/22/20 1422)   PRN Meds:.acetaminophen **OR** acetaminophen, bisacodyl, lidocaine-prilocaine, magnesium citrate, ondansetron **OR** ondansetron (ZOFRAN) IV, oxyCODONE, polyethylene glycol, prochlorperazine, traMADol, traZODone No Known Allergies Review of Systems  Unable to perform ROS Sleeping, did not wake.  Physical Exam Constitutional:      Comments: Sleeping soundly  Pulmonary:     Effort: Pulmonary effort is normal. No respiratory distress.  Skin:    General: Skin is warm and dry.     Vital Signs: BP (!) 98/46 (BP Location: Right Arm)   Pulse 67   Temp 97.6 F (36.4 C) (Oral)   Resp 19   Ht 6' (1.829 m)   Wt 60.4 kg   SpO2 95%   BMI 18.07 kg/m  Pain Scale: 0-10   Pain Score: 0-No pain   SpO2: SpO2: 95 % O2 Device:SpO2: 95 % O2 Flow Rate: .   IO: Intake/output summary:   Intake/Output Summary (Last 24 hours) at 01/22/2020 1557 Last data filed  at 01/22/2020 1500 Gross per 24 hour  Intake 2595.02 ml  Output 1120 ml  Net 1475.02 ml    LBM: Last BM Date: 01/22/20 Baseline Weight: Weight: 60.4 kg Most recent weight: Weight: 60.4 kg     Palliative Assessment/Data: PPS 30%    Time Total: 50 minutes Greater than 50%  of this time was spent counseling and coordinating care related to the above assessment and plan.  Juel Burrow, DNP, AGNP-C Palliative Medicine Team [phone] Pager: [phone]

## 2020-01-22 NOTE — Consult Note (Signed)
PHARMACY CONSULT NOTE - FOLLOW UP  Pharmacy Consult for Electrolyte Monitoring and Replacement   Recent Labs: Potassium (mmol/L)  Date Value  01/22/2020 3.2 (L)   Magnesium (mg/dL)  Date Value  01/22/2020 1.3 (L)   Calcium (mg/dL)  Date Value  01/22/2020 8.9   Albumin (g/dL)  Date Value  01/21/2020 2.3 (L)   Phosphorus (mg/dL)  Date Value  10/10/2019 2.5   Sodium (mmol/L)  Date Value  01/22/2020 135     Assessment: Vincent Poole is a 85 y.o. male with medical history significant of 85 year old male with history of locally invasive bladder cancer with bilateral nephrostsomies, colostomy and fistula history abdominal wall also covered with ostomy bag,prostate cancer, hypertension, iron deficiency anemia, GERD.  He presents with altered mental status, poor p.o. intake and increased pain above his baseline, and sepsis.  Pharmacy has been consulted to monitor/replete electrolytes.  Goal of Therapy:  Electrolytes wnl's  Plan:  Will give 4g Mg bolus IV x 1, and 60meq KCL tab po x 2  Will recheck bmp, Mg, and Phos with am labs.  Lu Duffel ,PharmD Clinical Pharmacist 01/22/2020 8:47 AM

## 2020-01-22 NOTE — Plan of Care (Signed)
  Problem: Pain Managment: Goal: General experience of comfort will improve Outcome: Completed/Met

## 2020-01-22 NOTE — Progress Notes (Signed)
New referral for [facility] hospice services at home received from Palliative NP Kathie Rhodes. TOC Vincent Poole aware.  Writer met in the room with patient and his wife Vincent Poole to initiate education regarding hospice services, philosophy and team approach to care with understanding voiced, Questions answered. DME needs discussed. Patient does need an air overlay for his hospital bed at home, this has been ordered for delivery tomorrow. Supply needs and medications also discussed. Patient will require EMS transport with out of facility DNR in place at discharge. Patient information given to referral. Hospice contact numbers givne to Vincent Poole. Will continue to follow through discharge. Flo Shanks BSN, RN, [place] [phone]

## 2020-01-22 NOTE — Consult Note (Signed)
Urology Consult  I have been asked to see the patient by Dr. Nolberto Hanlon, for evaluation and management of pain at the right nephrostomy tube.  Chief Complaint: Altered Mental Status  History of Present Illness: Vincent Poole is a 85 y.o. year old male with locally advanced stage IV bladder cancer associated with a malignant bowel obstruction with colostomy and necrosis through the abdominal wall with urine diverted via bilateral nephrostomy tubes who was brought to the ED for altered mental status.   His wife, [name], is present in the room and she has given the history.    She states her and her son gave him a full bath on Sunday and wiped him down on Monday and set him up in the chair for short amount of time.  Then yesterday she noted that the right nephrostomy tube was not draining, but his back was covered in a black liquid, his right side of the bed was soaked through the mattress up into the pillow with urine.   He was also complaining of discomfort and "was talking out of his head," so she was concerned that the nephro tube may have been pulled out of place and/or have an UTI.  She states when she was getting him clean and dressed to bring him to the emergency room, the right nephrostomy tube started to drain into the nephrostomy bag.  She states it was full of sediment at first, but then it cleared.   She states that both tubes have been draining well.  They are exchanged every 6 weeks.  At this time, both nephrostomy tubes appear to be draining well.  Both nephrostomy bags are with clear yellow urine with good output.   Nephrostomy sites are clean and dry with mild erythema directly around insertion sites.  Serum creatinine is 0.86.    Past Medical History:  Diagnosis Date  . Anxiety   . Arthritis   . Bladder cancer ([place])   . Dysrhythmia 07/2018   history of atrial flutter that worsens with anxiety  . Femur fracture, right ([place]) 05/01/2018  . GERD (gastroesophageal reflux  disease)   . History of recent blood transfusion 05/2018  . Hypertension   . Iron deficiency anemia 09/14/2018  . Prostate cancer ([place]) 07/2018   cancer growing in prostate but not prostate cancer, it is from the bladder  . Umbilical hernia 51/7001  . Urinary retention 2019   foley catheter place 11/2017  . UTI (urinary tract infection) 2019   frequent UTI's over last year  . Wound eschar of foot 07/2018   left heal getting wrapped and requiring antibiotic cream. cracks open with weight bearing.    Past Surgical History:  Procedure Laterality Date  . CARPAL TUNNEL RELEASE Right   . CHOLECYSTECTOMY  2004  . COLOSTOMY N/A 06/08/2019   Procedure: COLOSTOMY;  Surgeon: Herbert Pun, MD;  Location: [facility];  Service: General;  Laterality: N/A;  . CYSTOGRAM  07/18/2018   Procedure: CYSTOGRAM;  Surgeon: [name], MD;  Location: [facility];  Service: Urology;;  . Consuela Mimes W/ RETROGRADES Bilateral 07/18/2018   Procedure: CYSTOSCOPY WITH RETROGRADE PYELOGRAM;  Surgeon: [name], MD;  Location: [facility];  Service: Urology;  Laterality: Bilateral;  . CYSTOSCOPY W/ URETERAL STENT PLACEMENT Bilateral 12/27/2017   Procedure: CYSTOSCOPY WITH RETROGRADE PYELOGRAM/URETERAL STENT PLACEMENT;  Surgeon: [name], MD;  Location: [facility];  Service: Urology;  Laterality: Bilateral;  . CYSTOSCOPY W/ URETERAL STENT PLACEMENT Bilateral 07/18/2018   Procedure:  CYSTOSCOPY WITH STENT REPLACEMENT (exchange);  Surgeon: [name], MD;  Location: [facility];  Service: Urology;  Laterality: Bilateral;  . CYSTOSCOPY WITH STENT PLACEMENT Bilateral 11/12/2018   Procedure: [place] WITH STENT Exchange;  Surgeon: [name], MD;  Location: [facility];  Service: Urology;  Laterality: Bilateral;  . INTRAMEDULLARY (IM) NAIL INTERTROCHANTERIC Right 05/02/2018   Procedure: INTRAMEDULLARY (IM) NAIL INTERTROCHANTRIC;  Surgeon: [name], MD;  Location: [facility];  Service: Orthopedics;  Laterality:  Right;  . IR NEPHROSTOMY EXCHANGE LEFT  03/25/2019  . IR NEPHROSTOMY EXCHANGE LEFT  04/19/2019  . IR NEPHROSTOMY EXCHANGE LEFT  05/31/2019  . IR NEPHROSTOMY EXCHANGE LEFT  07/19/2019  . IR NEPHROSTOMY EXCHANGE LEFT  09/13/2019  . IR NEPHROSTOMY EXCHANGE LEFT  11/22/2019  . IR NEPHROSTOMY EXCHANGE LEFT  01/03/2020  . IR NEPHROSTOMY EXCHANGE RIGHT  03/25/2019  . IR NEPHROSTOMY EXCHANGE RIGHT  04/19/2019  . IR NEPHROSTOMY EXCHANGE RIGHT  05/31/2019  . IR NEPHROSTOMY EXCHANGE RIGHT  07/19/2019  . IR NEPHROSTOMY EXCHANGE RIGHT  09/13/2019  . IR NEPHROSTOMY EXCHANGE RIGHT  11/22/2019  . IR NEPHROSTOMY EXCHANGE RIGHT  01/03/2020  . IR NEPHROSTOMY PLACEMENT LEFT  02/23/2019  . IR NEPHROSTOMY PLACEMENT RIGHT  02/23/2019  . LAPAROTOMY N/A 06/08/2019   Procedure: EXPLORATORY LAPAROTOMY;  Surgeon: Herbert Pun, MD;  Location: [facility];  Service: General;  Laterality: N/A;  . LEG TENDON SURGERY Right 1958  . PORTA CATH INSERTION N/A 01/22/2018   Procedure: PORTA CATH INSERTION;  Surgeon: [name], MD;  Location: [facility];  Service: Cardiovascular;  Laterality: N/A;  . TRANSURETHRAL RESECTION OF BLADDER TUMOR N/A 12/27/2017   Procedure: TRANSURETHRAL RESECTION OF BLADDER TUMOR (TURBT);  Surgeon: [name], MD;  Location: [facility];  Service: Urology;  Laterality: N/A;  . TRANSURETHRAL RESECTION OF BLADDER TUMOR N/A 01/15/2018   Procedure: TRANSURETHRAL RESECTION OF BLADDER TUMOR (TURBT);  Surgeon: [name], MD;  Location: [facility];  Service: Urology;  Laterality: N/A;  Need 2 hrs for this case please  . TRANSURETHRAL RESECTION OF BLADDER TUMOR N/A 07/18/2018   Procedure: TRANSURETHRAL RESECTION OF BLADDER TUMOR (TURBT);  Surgeon: [name], MD;  Location: [facility];  Service: Urology;  Laterality: N/A;    Home Medications:  No current facility-administered medications on file prior to encounter.   Current Outpatient Medications on File Prior to Encounter  Medication Sig  Dispense Refill  . Cranberry-Cholecalciferol (SUPER CRANBERRY/VITAMIN D3) 4200-500 MG-UNIT CAPS Take 1 capsule by mouth 2 (two) times daily.     . feeding supplement, ENSURE ENLIVE, (ENSURE ENLIVE) LIQD Take 237 mLs by mouth 2 (two) times daily between meals. (Patient taking differently: Take 237 mLs by mouth 3 (three) times daily between meals. ) 237 mL 12  . ferrous sulfate 325 (65 FE) MG tablet Take 1 tablet (325 mg total) by mouth 2 (two) times daily with a meal.  3  . lidocaine-prilocaine (EMLA) cream Apply 1 application topically as needed. 30 g 1  . mirabegron ER (MYRBETRIQ) 50 MG TB24 tablet Take 1 tablet (50 mg total) by mouth daily. 30 tablet 3  . MONOJECT PREFILL ADVANCED NACL 0.9 % SOLN injection Inject 10 mLs into the vein daily.    . Multiple Vitamin (MULTIVITAMIN WITH MINERALS) TABS tablet Take 1 tablet by mouth daily.    . Oxycodone HCl 10 MG TABS Take 1 tablet (10 mg total) by mouth every 4 (four) hours as needed. 180 tablet 0  . polyethylene glycol powder (MIRALAX) powder Take 17  g by mouth daily as needed. Can increase to 3 times a day as needed for constipation but hold medication if has diarrhea 255 g 0  . vitamin B-12 (CYANOCOBALAMIN) 500 MCG tablet Take 500 mcg by mouth daily.    Marland Kitchen acetaminophen (TYLENOL) 500 MG tablet Take 1,000 mg by mouth every 6 (six) hours as needed for moderate pain or headache.     . cetirizine (ZYRTEC) 10 MG tablet Take 10 mg by mouth daily as needed for allergies.     Derrill Memo ON 02/13/2020] ciprofloxacin (CIPRO) 500 MG tablet Take 1 tablet (500 mg total) by mouth 2 (two) times daily. (Patient not taking: Reported on 01/21/2020) 6 tablet 0  . nystatin (NYAMYC) powder Apply topically 2 (two) times daily. (Patient not taking: Reported on 12/02/2019) 60 g 3  . ondansetron (ZOFRAN) 8 MG tablet Take 1 tablet (8 mg total) by mouth 2 (two) times daily as needed for refractory nausea / vomiting. Start on day 3 after chemo. (Patient not taking: Reported on  12/02/2019) 30 tablet 1  . prochlorperazine (COMPAZINE) 10 MG tablet Take 1 tablet (10 mg total) by mouth every 6 (six) hours as needed (Nausea or vomiting). (Patient not taking: Reported on 11/11/2019) 30 tablet 1  . traZODone (DESYREL) 50 MG tablet Take 0.5 tablets (25 mg total) by mouth at bedtime as needed for sleep. (Patient not taking: Reported on 12/02/2019) 30 tablet 1    Allergies: No Known Allergies  Family History  Problem Relation Age of Onset  . Cancer Mother   . Chronic Renal Failure Mother   . Heart disease Father     Social History:  reports that he has never smoked. He has never used smokeless tobacco. He reports that he does not drink alcohol or use drugs.  ROS: A complete review of systems was performed.  All systems are negative except for pertinent findings as noted.  Physical Exam:  Vital signs in last 24 hours: Temp:  [97.4 F (36.3 C)-97.6 F (36.4 C)] 97.4 F (36.3 C) (03/17 0836) Pulse Rate:  [53-110] 89 (03/17 0836) Resp:  [18-25] 19 (03/17 0022) BP: (90-116)/(47-64) 109/54 (03/17 0836) SpO2:  [97 %-100 %] 98 % (03/17 0836) Constitutional:  Elderly and frail-appearing.  Wincing with pain as necrotic site is cleansed. HEENT: [place] AT, dry mucus membranes.  Trachea midline, no masses. Cardiovascular: Clubbing and pedal edema noted Respiratory: Normal respiratory effort, no increased work of breathing. GI: Large wound with necrotic tissue located in the RLQ -ostomy nurse is currently attending to the wound, a smaller linear area of necrotic tissue breakthrough just below the large wound, colostomy bag in place. GU: No CVA tenderness.  No bladder fullness or masses.  Bilateral nephrostomy tubes in place.  Both draining clear yellow urine with good output. Skin: Sacral decubitus ulcer covered with dressing  Lymph: No cervical or inguinal adenopathy. Neurologic: Grossly intact, no focal deficits, moving all 4 extremities. Psychiatric: Wincing facial expressions.   Has hearing impairment and just expresses that he hurts  Laboratory Data:  Recent Labs    01/21/20 1221 01/22/20 0531  WBC 91.3* 73.2*  HGB 10.4* 8.9*  HCT 32.3* 27.9*   Recent Labs    01/21/20 1221 01/22/20 0531  NA 130* 135  K 3.7 3.2*  CL 97* 105  CO2 22 25  GLUCOSE 104* 93  BUN 36* 29*  CREATININE 1.07 0.86  CALCIUM 9.4 8.9   Recent Labs    01/21/20 1221 01/22/20 0531  INR 1.1  1.2   No results for input(s): LABURIN in the last 72 hours. Results for orders placed or performed during the hospital encounter of 01/21/20  Blood Culture (routine x 2)     Status: None (Preliminary result)   Collection Time: 01/21/20 12:21 PM   Specimen: BLOOD  Result Value Ref Range Status   Specimen Description BLOOD BLOOD RIGHT FOREARM  Final   Special Requests   Final    BOTTLES DRAWN AEROBIC AND ANAEROBIC Blood Culture adequate volume   Culture  Setup Time IN BOTH AEROBIC AND ANAEROBIC BOTTLES  Final   Culture   Final    NO GROWTH < 24 HOURS Performed at [facility], [address], [place], [place] [address]    Report Status PENDING  Incomplete  Blood Culture (routine x 2)     Status: None (Preliminary result)   Collection Time: 01/21/20 12:21 PM   Specimen: BLOOD  Result Value Ref Range Status   Specimen Description BLOOD BLOOD LEFT ARM  Final   Special Requests   Final    BOTTLES DRAWN AEROBIC AND ANAEROBIC Blood Culture results may not be optimal due to an inadequate volume of blood received in culture bottles   Culture  Setup Time ANAEROBIC BOTTLE ONLY  Final   Culture   Final    NO GROWTH < 24 HOURS Performed at [facility], [address] [place], [place] [address]    Report Status PENDING  Incomplete  SARS CORONAVIRUS 2 (TAT 6-24 HRS) Nasopharyngeal Nasopharyngeal Swab     Status: None   Collection Time: 01/21/20  3:05 PM   Specimen: Nasopharyngeal Swab  Result Value Ref Range Status   SARS Coronavirus 2 NEGATIVE NEGATIVE Final    Comment:  (NOTE) SARS-CoV-2 target nucleic acids are NOT DETECTED. The SARS-CoV-2 RNA is generally detectable in upper and lower respiratory specimens during the acute phase of infection. Negative results do not preclude SARS-CoV-2 infection, do not rule out co-infections with other pathogens, and should not be used as the sole basis for treatment or other patient management decisions. Negative results must be combined with clinical observations, patient history, and epidemiological information. The expected result is Negative. Fact Sheet for Patients: SugarRoll.be Fact Sheet for Healthcare Providers: https://www.woods-mathews.com/ This test is not yet approved or cleared by the [place] FDA and  has been authorized for detection and/or diagnosis of SARS-CoV-2 by FDA under an Emergency Use Authorization (EUA). This EUA will remain  in effect (meaning this test can be used) for the duration of the COVID-19 declaration under Section 56 4(b)(1) of the Act, 21 U.S.C. section 360bbb-3(b)(1), unless the authorization is terminated or revoked sooner. Performed at [facility], [place] [address] [place], [place] [address]      Radiologic Imaging: CT ABDOMEN PELVIS W CONTRAST  Result Date: 01/21/2020 CLINICAL DATA:  history of bladder and prostate cancer. EXAM: CT ABDOMEN AND PELVIS WITH CONTRAST TECHNIQUE: Multidetector CT imaging of the abdomen and pelvis was performed using the standard protocol following bolus administration of intravenous contrast. CONTRAST:  165mL OMNIPAQUE IOHEXOL 300 MG/ML  SOLN COMPARISON:  11/23/2019 FINDINGS: Lower chest: Small bilateral pleural effusions are again noted and appears slightly increased in volume from previous exam. Progressive masslike architectural distortion overlying the right lower lobe is favored to represent rounded atelectasis. Hepatobiliary: Cholecystectomy. No focal liver abnormality. No biliary ductal  dilatation. Pancreas: Unremarkable. No pancreatic ductal dilatation or surrounding inflammatory changes. Spleen: Spleen measures 11.4 cm in length. No focal splenic  abnormality Adrenals/Urinary Tract: Normal appearance of the adrenal glands. Bilateral percutaneous nephrostomy tubes are in place. No right-sided hydronephrosis. Similar appearance of mild left hydronephrosis. Unchanged appearance of right kidney cyst. Large complex necrotic mass arising from the region of the bladder and prostate is again noted. This measures 13.9 x 9.8 by 10.3 cm (volume = 730 cm^3). Previously (by my measurements) this measures 12.2 x 9.2 by 11.3 cm (volume = 660 cm^3). Stomach/Bowel: Stomach is within normal limits. Appendix appears normal. No evidence of bowel wall thickening, distention, or inflammatory changes. Transverse colostomy is identified along the midline of abdomen. Vascular/Lymphatic: Aortic atherosclerosis. No aneurysm. No abdominopelvic adenopathy. Reproductive: The large pelvic mass is directly contiguous and inseparable from the prostate gland. Other: No significant free fluid or fluid collections. Musculoskeletal: Previous ORIF of right proximal femur. No acute or suspicious bone abnormality. IMPRESSION: 1. Interval increase in volume of large complex necrotic tumor arising from the region of the bladder and prostate gland. 2. Bilateral percutaneous nephrostomy tubes are in place. Similar appearance of mild left hydronephrosis. 3. Small bilateral pleural effusions are slightly increased in volume from previous exam. 4. Progressive masslike architectural distortion overlying the right lower lobe is favored to represent rounded atelectasis. Attention on follow-up imaging advised. Aortic Atherosclerosis (ICD10-I70.0). Electronically Signed   By: Kerby Moors M.D.   On: 01/21/2020 14:33   DG Chest Port 1 View  Result Date: 01/21/2020 CLINICAL DATA:  Altered mental status.  Oral temperature 100.9 EXAM: PORTABLE  CHEST 1 VIEW COMPARISON:  Moderate-sized right pleural effusion which may be FINDINGS: Unchanged position of a right chest infusion port catheter. Heart size within normal limits. Aortic atherosclerosis. New from prior examination, there is a moderate-sized right pleural effusion which may be partially loculated. Associated right basilar opacity. Diffuse chronic prominence of the interstitial lung markings. The left lung is otherwise clear. No evidence of pneumothorax. No acute bony abnormality. Thoracic spondylosis. IMPRESSION: New moderate-sized right pleural effusion which may be partially loculated. Right basilar atelectasis. Pneumonia at the right lung base cannot be excluded. Aortic atherosclerosis. Electronically Signed   By: Kellie Simmering DO   On: 01/21/2020 13:24    Impression/Assessment:  85 year old male with locally advanced stage IV bladder cancer that is necrosing through the abdominal wall with urine diversion accomplished with bilateral nephrostomy tubes admitted for altered mental status.  Plan:  At this time, no urgent urological intervention is recommended as both nephrostomy tubes appear to be draining well at this time. They are scheduled to be exchanged on April 9th.  There was a question by palliative care to the stopcock and flush nephrostomy tubes and pursue hospice care, but the family is persistent and wanting to pursue nephrostomy changes.  I would encourage the family to seek hospice care although they have been very resistant to this in the past.    01/22/2020, 12:34 PM  Zara Council, PA-C   Thank you for involving me in this patient's care, I will continue to follow along.  Please page with any further questions or concerns.  Jeanine Caven PA-C

## 2020-01-22 NOTE — Progress Notes (Signed)
PROGRESS NOTE    Vincent Poole  RWE:[id number] DOB: [birth year] DOA: 01/21/2020 PCP: [name], MD    Brief Narrative:   Vincent Poole is a 85 y.o. male with medical history significant of 85 year old male with history of locally invasive bladder cancer with bilateral nephrostsomies, colostomy and fistula history abdominal wall also covered with ostomy bag,prostate cancer, hypertension, iron deficiency anemia, GERD.  He presented to the ED today accompanied by his wife with altered mental status, poor p.o. intake and increased pain above his baseline.     Consultants:   urology  Procedures:   Antimicrobials:   Vanco and cefepime   Subjective: Pt with hard of hearing. Mildly confused.  At bedside she is giving me the history. She reports he c/o right nephrostomy tube pain.  Objective: Vitals:   01/21/20 1824 01/22/20 0022 01/22/20 0836 01/22/20 1556  BP: (!) 102/48 (!) 90/47 (!) 109/54 (!) 98/46  Pulse: 96 92 89 67  Resp:  19    Temp: 97.6 F (36.4 C) (!) 97.5 F (36.4 C) (!) 97.4 F (36.3 C) 97.6 F (36.4 C)  TempSrc: Oral  Oral Oral  SpO2: 99% 97% 98% 95%  Weight:      Height:        Intake/Output Summary (Last 24 hours) at 01/22/2020 1702 Last data filed at 01/22/2020 1500 Gross per 24 hour  Intake 2595.02 ml  Output 1120 ml  Net 1475.02 ml   Filed Weights   01/21/20 1207  Weight: 60.4 kg    Examination:  General exam: Appears calm and comfortable ,nad Respiratory system: Clear to auscultation. Respiratory effort normal. Cardiovascular system: S1 & S2 heard, RRR. No JVD, murmurs, rubs, gallops or clicks.  Gastrointestinal system: Abdomen is nondistended, soft and nontender.  Normal bowel sounds heard. Bilateral nephrostomy tube in place, no erythema of the right side difficult to move patient for further evaluation Central nervous system: Confused, hard of hearing. Extremities: No edema Skin: Warm dry Psychiatry: Unable to  assess    Data Reviewed: I have personally reviewed following labs and imaging studies  CBC: Recent Labs  Lab 01/21/20 1221 01/22/20 0531  WBC 91.3* 73.2*  NEUTROABS 81.7*  --   HGB 10.4* 8.9*  HCT 32.3* 27.9*  MCV 90.5 90.3  PLT 610* 619*   Basic Metabolic Panel: Recent Labs  Lab 01/21/20 1221 01/22/20 0531  NA 130* 135  K 3.7 3.2*  CL 97* 105  CO2 22 25  GLUCOSE 104* 93  BUN 36* 29*  CREATININE 1.07 0.86  CALCIUM 9.4 8.9  MG  --  1.3*   GFR: Estimated Creatinine Clearance: 56.6 mL/min (by C-G formula based on SCr of 0.86 mg/dL). Liver Function Tests: Recent Labs  Lab 01/21/20 1221  AST 11*  ALT 8  ALKPHOS 161*  BILITOT 0.9  PROT 6.3*  ALBUMIN 2.3*   Recent Labs  Lab 01/21/20 1221  LIPASE 15   No results for input(s): AMMONIA in the last 168 hours. Coagulation Profile: Recent Labs  Lab 01/21/20 1221 01/22/20 0531  INR 1.1 1.2   Cardiac Enzymes: No results for input(s): CKTOTAL, CKMB, CKMBINDEX, TROPONINI in the last 168 hours. BNP (last 3 results) No results for input(s): PROBNP in the last 8760 hours. HbA1C: No results for input(s): HGBA1C in the last 72 hours. CBG: No results for input(s): GLUCAP in the last 168 hours. Lipid Profile: No results for input(s): CHOL, HDL, LDLCALC, TRIG, CHOLHDL, LDLDIRECT in the last 72 hours. Thyroid Function Tests: Recent  Labs    01/22/20 0531  TSH 5.589*   Anemia Panel: No results for input(s): VITAMINB12, FOLATE, FERRITIN, TIBC, IRON, RETICCTPCT in the last 72 hours. Sepsis Labs: Recent Labs  Lab 01/21/20 1221 01/22/20 0531  PROCALCITON 1.08 1.43  LATICACIDVEN 1.9  --     Recent Results (from the past 240 hour(s))  Blood Culture (routine x 2)     Status: None (Preliminary result)   Collection Time: 01/21/20 12:21 PM   Specimen: BLOOD  Result Value Ref Range Status   Specimen Description BLOOD BLOOD RIGHT FOREARM  Final   Special Requests   Final    BOTTLES DRAWN AEROBIC AND ANAEROBIC  Blood Culture adequate volume   Culture  Setup Time IN BOTH AEROBIC AND ANAEROBIC BOTTLES  Final   Culture   Final    NO GROWTH < 24 HOURS Performed at [facility], [address], [place], [place] [address]    Report Status PENDING  Incomplete  Blood Culture (routine x 2)     Status: None (Preliminary result)   Collection Time: 01/21/20 12:21 PM   Specimen: BLOOD  Result Value Ref Range Status   Specimen Description BLOOD BLOOD LEFT ARM  Final   Special Requests   Final    BOTTLES DRAWN AEROBIC AND ANAEROBIC Blood Culture results may not be optimal due to an inadequate volume of blood received in culture bottles   Culture  Setup Time ANAEROBIC BOTTLE ONLY  Final   Culture   Final    NO GROWTH < 24 HOURS Performed at [facility], [address], [place], [place] [address]    Report Status PENDING  Incomplete  Urine culture     Status: Abnormal (Preliminary result)   Collection Time: 01/21/20 12:21 PM   Specimen: In/Out Cath Urine  Result Value Ref Range Status   Specimen Description   Final    IN/OUT CATH URINE Performed at [facility], [address], [place], [place] [address]    Special Requests   Final    NONE Performed at [facility], [address] [place], [place] [address]    Culture (A)  Final    >=100,000 COLONIES/mL GRAM NEGATIVE RODS CULTURE REINCUBATED FOR BETTER GROWTH Performed at [facility], [place] [address] [place],  [address]    Report Status PENDING  Incomplete  SARS CORONAVIRUS 2 (TAT 6-24 HRS) Nasopharyngeal Nasopharyngeal Swab     Status: None   Collection Time: 01/21/20  3:05 PM   Specimen: Nasopharyngeal Swab  Result Value Ref Range Status   SARS Coronavirus 2 NEGATIVE NEGATIVE Final    Comment: (NOTE) SARS-CoV-2 target nucleic acids are NOT DETECTED. The SARS-CoV-2 RNA is generally detectable in upper and lower respiratory specimens during the acute phase of infection. Negative results  do not preclude SARS-CoV-2 infection, do not rule out co-infections with other pathogens, and should not be used as the sole basis for treatment or other patient management decisions. Negative results must be combined with clinical observations, patient history, and epidemiological information. The expected result is Negative. Fact Sheet for Patients: SugarRoll.be Fact Sheet for Healthcare Providers: https://www.woods-mathews.com/ This test is not yet approved or cleared by the [place] FDA and  has been authorized for detection and/or diagnosis of SARS-CoV-2 by FDA under an Emergency Use Authorization (EUA). This EUA will remain  in effect (meaning this test can be used) for the duration of the COVID-19 declaration under Section 56 4(b)(1) of the Act, 21  U.S.C. section 360bbb-3(b)(1), unless the authorization is terminated or revoked sooner. Performed at [facility], [place] [address] [place], [place] [address]          Radiology Studies: CT ABDOMEN PELVIS W CONTRAST  Result Date: 01/21/2020 CLINICAL DATA:  history of bladder and prostate cancer. EXAM: CT ABDOMEN AND PELVIS WITH CONTRAST TECHNIQUE: Multidetector CT imaging of the abdomen and pelvis was performed using the standard protocol following bolus administration of intravenous contrast. CONTRAST:  113mL OMNIPAQUE IOHEXOL 300 MG/ML  SOLN COMPARISON:  11/23/2019 FINDINGS: Lower chest: Small bilateral pleural effusions are again noted and appears slightly increased in volume from previous exam. Progressive masslike architectural distortion overlying the right lower lobe is favored to represent rounded atelectasis. Hepatobiliary: Cholecystectomy. No focal liver abnormality. No biliary ductal dilatation. Pancreas: Unremarkable. No pancreatic ductal dilatation or surrounding inflammatory changes. Spleen: Spleen measures 11.4 cm in length. No focal splenic abnormality Adrenals/Urinary  Tract: Normal appearance of the adrenal glands. Bilateral percutaneous nephrostomy tubes are in place. No right-sided hydronephrosis. Similar appearance of mild left hydronephrosis. Unchanged appearance of right kidney cyst. Large complex necrotic mass arising from the region of the bladder and prostate is again noted. This measures 13.9 x 9.8 by 10.3 cm (volume = 730 cm^3). Previously (by my measurements) this measures 12.2 x 9.2 by 11.3 cm (volume = 660 cm^3). Stomach/Bowel: Stomach is within normal limits. Appendix appears normal. No evidence of bowel wall thickening, distention, or inflammatory changes. Transverse colostomy is identified along the midline of abdomen. Vascular/Lymphatic: Aortic atherosclerosis. No aneurysm. No abdominopelvic adenopathy. Reproductive: The large pelvic mass is directly contiguous and inseparable from the prostate gland. Other: No significant free fluid or fluid collections. Musculoskeletal: Previous ORIF of right proximal femur. No acute or suspicious bone abnormality. IMPRESSION: 1. Interval increase in volume of large complex necrotic tumor arising from the region of the bladder and prostate gland. 2. Bilateral percutaneous nephrostomy tubes are in place. Similar appearance of mild left hydronephrosis. 3. Small bilateral pleural effusions are slightly increased in volume from previous exam. 4. Progressive masslike architectural distortion overlying the right lower lobe is favored to represent rounded atelectasis. Attention on follow-up imaging advised. Aortic Atherosclerosis (ICD10-I70.0). Electronically Signed   By: Kerby Moors M.D.   On: 01/21/2020 14:33   DG Chest Port 1 View  Result Date: 01/21/2020 CLINICAL DATA:  Altered mental status.  Oral temperature 100.9 EXAM: PORTABLE CHEST 1 VIEW COMPARISON:  Moderate-sized right pleural effusion which may be FINDINGS: Unchanged position of a right chest infusion port catheter. Heart size within normal limits. Aortic  atherosclerosis. New from prior examination, there is a moderate-sized right pleural effusion which may be partially loculated. Associated right basilar opacity. Diffuse chronic prominence of the interstitial lung markings. The left lung is otherwise clear. No evidence of pneumothorax. No acute bony abnormality. Thoracic spondylosis. IMPRESSION: New moderate-sized right pleural effusion which may be partially loculated. Right basilar atelectasis. Pneumonia at the right lung base cannot be excluded. Aortic atherosclerosis. Electronically Signed   By: Kellie Simmering DO   On: 01/21/2020 13:24        Scheduled Meds: . Chlorhexidine Gluconate Cloth  6 each Topical Daily  . collagenase   Topical Daily  . enoxaparin (LOVENOX) injection  40 mg Subcutaneous Q24H  . feeding supplement (ENSURE ENLIVE)  237 mL Oral TID BM  . ferrous sulfate  325 mg Oral BID WC  . loratadine  10 mg Oral Daily  . mirabegron ER  50 mg Oral Daily  .  multivitamin with minerals  1 tablet Oral Daily  . oxyCODONE  10 mg Oral Q12H  . vitamin B-12  500 mcg Oral Daily   Continuous Infusions: . ceFEPime (MAXIPIME) IV 200 mL/hr at 01/22/20 1500  . vancomycin Stopped (01/22/20 1422)    Assessment & Plan:   Principal Problem:   Sepsis secondary to UTI [facility]) Active Problems:   Essential hypertension   Lumbar radiculitis   Malignant neoplasm of prostate (HCC)   OA (osteoarthritis) of hip   Malignant neoplasm of urinary bladder (HCC)   Complicated UTI (urinary tract infection)   Leukocytosis   Pressure injury of skin   Abdominal pain, generalized   Protein-calorie malnutrition, severe   Vitamin B12 deficiency   History of recurrent UTIs   Hyponatremia   Thrombocytosis (HCC)   Sepsis secondary to complicated UTI  ucx with GN rod. Awaiting sensitiv. Sepsis present on admission with leukocytosis, fever, tachycardia and UA consistent with infection.   --Continue IV hydration --Vancomycin and cefepime to cover for  possible cellulitis and complicated UTI --Covid swab neg.    Malignant neoplasm of urinary bladder/Malignant neoplasm of prostate  locally invasive with abdominal wall fistula, bilateral nephrostomies, colostomy. She was complaining of right nephrostomy tube pain per wife, urology consulted input appreciated no urgent urological intervention is recommended as both nephrostomy tubes appear to be draining well at this time. They are scheduled to be exchanged on April 9th.   Palliative care consulted, wife is willing to speak to hospice    Generalized abdominal pain -secondary to his bladder cancer and abdominal wall involvement Oncology has exhausted all treatment options for patient's cancer.  Palliative care and hospice have been recommended on multiple occasions and patient wife have declined so far.  Oncology was consulted during patient's January admission and reported nothing more to offer, supportive care only at this point. --Palliative consulted, also discussed with palliative care about getting hospice involved as patient's wife willing to speak to them. --Pain management: continue home oxycodone 10 mg every 4 hours as needed    start OxyContin 10 mg twice daily for baseline pain coverage --Adjust pain medications as needed for patient's comfort --Wound care consulted for ostomy management  Hyponatremia -sodium 130 on admission, likely due to poor p.o. intake and hypovolemia. Now improved --Expect improvement with IV hydration --Recheck BMP in the morning --Further work-up if not improving  Thrombocytosis -suspect reactive, platelets 610k on admission --Monitor CBC  Leukocytosis -has chronic leukocytosis usually in the 20ks, presented with white count of 91.3k.  Improving with treating infection Monitor CBC  Essential hypertension -appears no longer be on medications at home and blood pressure soft on presentation. Continue to monitor  Chronic pain secondary to  locally advanced malignancy, lumbar radiculitis and osteoarthritis (hip) --Pain management as above --Palliative consult  Pressure injury of skin -present on admission --Wound care consulted  Protein-calorie malnutrition, severe --Dietitian to follow --Continue multivitamin  Hearing impairment -patient is extremely hard of hearing.       DVT prophylaxis: Lovenox Code Status: DNR Family Communication: Wife at bedside discussed everything with her Disposition Plan: 1 to 2 days when sensitivity returns and patient is able to be switched to p.o. antibiotics. Barrier; being treated for sepsis and needs ivabx as currently unable to switch to po abx at this time.       LOS: 1 day   Time spent: 45 minutes with more than 50% on [place], MD [facility] Pager [phone]  If 7PM-7AM, please contact night-coverage [email or web address] Password [email or web address] 01/22/2020, 5:02 PM

## 2020-01-22 NOTE — Consult Note (Signed)
WOC Nurse Consult Note: Reason for Consult: Abdominal draining lesion (cancerous) Colostomy Sacral wound  Wound type: 1. Abdominal draining lesion; SP region 2. Deep tissue pressure injury; sacrum 3. Deep tissue pressure injury; thoracic spine 4. Stage 1 pressure injury; right lateral thoracic spine  5. Deep tissue pressure injury; left heel  Pressure Injury POA: Yes Measurement: 2. 3cm x 2.5cm x 0cm  3. 4cm x 0.5cm x 0cm  4. 3cm x 2cm x 0cm  5. 1.5cm x 2.5cm x 0cm  Wound bed: 1. Abdominal lesion; purulent bulging area with watery pus draining 2. Dark purple; non blanchable 3. Dark purple; non blanchable 4. Dark red; non blanchable 5. Dark purple; non blanchable  Drainage (amount, consistency, odor)  1. See above; foul odor 2-5 none Periwound: frail; emaciated patient; pain with movement and pain with manipulation of pouching system.  1. Lesion with 4-5 cm of denudation from leakage Dressing procedure/placement/frequency: Routine ostomy pouching for colostomy Cancerous lesion; Crusted denuded skin with ostomy powder and tap water; place medium Eakin pouch to suction with 14 f red robinson catheter at 1mmHG.  Silicone foam to the left heel; sacrum; and spinal wounds.  Air mattress in place for moisture management and pressure redistribution Prevalon boots to offload heels  Explained new pressure injuries; cause and that pressure injuries can occur in patient's as sick as he is within 2 hours.  He is so frail that onset of additional PI is likely. She is planning to obtain air mattress at home  Explained rationale for the suction and pouching system and that suction is not available at home that I am using it to help improve skin for use of Eakin maybe at home.  Explained that [facility] are not routinely covered by insurances as well. She verbalized understanding.   Will follow up with patient tomorrow to determine if suction and Eakin are working for containment for this  patient. I will change colostomy pouch at that time.   [place], [place], [place]

## 2020-01-23 LAB — CBC
HCT: 28.2 % — ABNORMAL LOW (ref 39.0–52.0)
Hemoglobin: 9.2 g/dL — ABNORMAL LOW (ref 13.0–17.0)
MCH: 29.3 pg (ref 26.0–34.0)
MCHC: 32.6 g/dL (ref 30.0–36.0)
MCV: 89.8 fL (ref 80.0–100.0)
Platelets: 394 10*3/uL (ref 150–400)
RBC: 3.14 MIL/uL — ABNORMAL LOW (ref 4.22–5.81)
RDW: 15.9 % — ABNORMAL HIGH (ref 11.5–15.5)
WBC: 77 10*3/uL (ref 4.0–10.5)
nRBC: 0 % (ref 0.0–0.2)

## 2020-01-23 LAB — BASIC METABOLIC PANEL
Anion gap: 7 (ref 5–15)
BUN: 22 mg/dL (ref 8–23)
CO2: 21 mmol/L — ABNORMAL LOW (ref 22–32)
Calcium: 9.4 mg/dL (ref 8.9–10.3)
Chloride: 104 mmol/L (ref 98–111)
Creatinine, Ser: 0.84 mg/dL (ref 0.61–1.24)
GFR calc Af Amer: >60 mL/min (ref >60)
GFR calc non Af Amer: >60 mL/min (ref >60)
Glucose, Bld: 108 mg/dL — ABNORMAL HIGH (ref 70–99)
Potassium: 4.1 mmol/L (ref 3.5–5.1)
Sodium: 132 mmol/L — ABNORMAL LOW (ref 135–145)

## 2020-01-23 LAB — PHOSPHORUS: Phosphorus: 2.5 mg/dL (ref 2.5–4.6)

## 2020-01-23 LAB — MAGNESIUM: Magnesium: 2 mg/dL (ref 1.7–2.4)

## 2020-01-23 NOTE — Consult Note (Signed)
PHARMACY CONSULT NOTE - FOLLOW UP  Pharmacy Consult for Electrolyte Monitoring and Replacement   Recent Labs: Potassium (mmol/L)  Date Value  01/23/2020 4.1   Magnesium (mg/dL)  Date Value  01/23/2020 2.0   Calcium (mg/dL)  Date Value  01/23/2020 9.4   Albumin (g/dL)  Date Value  01/21/2020 2.3 (L)   Phosphorus (mg/dL)  Date Value  01/23/2020 2.5   Sodium (mmol/L)  Date Value  01/23/2020 132 (L)     Assessment: Vincent Poole is a 85 y.o. male with medical history significant of 85 year old male with history of locally invasive bladder cancer with bilateral nephrostsomies, colostomy and fistula history abdominal wall also covered with ostomy bag,prostate cancer, hypertension, iron deficiency anemia, GERD.  He presents with altered mental status, poor p.o. intake and increased pain above his baseline, and sepsis.  Pharmacy has been consulted to monitor/replete electrolytes.  Goal of Therapy:  Electrolytes wnl's  Plan:  No electrolyte replenishment warranted today x 1  Will recheck bmp, Mg with am labs.  Lu Duffel, PharmD, BCPS Clinical Pharmacist 01/23/2020 10:24 AM

## 2020-01-23 NOTE — Progress Notes (Signed)
Wife at bedside today, became upset while talking to Probation officer r/t discussions of hospice and palliative care. Wife states "I want him to be comfortable but I'm not just going to let him lay here and die when he's happy lying there talking to [place]." Wife says she is unsure at this point about hospice or palliative care because she doesn't think he is "at that point" and "everyone in this hospital just wants to throw him out with an infection and let me deal with him at home." Wife states "if they try to send him home with an infection they'll have a lawsuit on their hands." At this time, this RN asked if wife would like the doctor or palliative/hospice provider called to the room. Wife declined; states she has business cards and will call them when she is ready.

## 2020-01-23 NOTE — Progress Notes (Signed)
PROGRESS NOTE    Vincent Poole  SFK:[id number] DOB: 1935/03/23 DOA: 01/21/2020 PCP: [name], Vincent Poole    Brief Narrative:   Vincent Poole is a 85 y.o. male with medical history significant of 85 year old male with history of locally invasive bladder cancer with bilateral nephrostsomies, colostomy and fistula history abdominal wall also covered with ostomy bag,prostate cancer, hypertension, iron deficiency anemia, GERD.  He presented to the ED today accompanied by his wife with altered mental status, poor p.o. intake and increased pain above his baseline.     Consultants:   urology  Procedures:   Antimicrobials:   Vanco and cefepime   Subjective: Pt with hard of hearing.  Wife at bedside.  Reports his confused today.  Objective: Vitals:   01/22/20 2349 01/22/20 2354 01/22/20 2354 01/23/20 0819  BP: (!) 151/66 105/63 105/63 (!) 102/57  Pulse: 74 95 100 92  Resp:    18  Temp:   98.3 F (36.8 C) 98 F (36.7 C)  TempSrc:      SpO2: 99% 97% 97% 99%  Weight:      Height:        Intake/Output Summary (Last 24 hours) at 01/23/2020 1442 Last data filed at 01/23/2020 1416 Gross per 24 hour  Intake 2766.74 ml  Output 925 ml  Net 1841.74 ml   Filed Weights   01/21/20 1207  Weight: 60.4 kg    Examination:  General exam: Appears calm and comfortable ,nad Respiratory system: Clear to auscultation. Poor resp. effort Cardiovascular system: S1 & S2 heard, RRR. No murmurs, rubs, gallops or clicks.  Gastrointestinal system: Abdomen is nondistended, soft and nontender.  Normal bowel sounds heard. Bilateral nephrostomy tube in place. Central nervous system: Confused, hard of hearing. Extremities: No edema Skin: Warm dry Psychiatry: Unable to assess    Data Reviewed: I have personally reviewed following labs and imaging studies  CBC: Recent Labs  Lab 01/21/20 1221 01/22/20 0531 01/23/20 0945  WBC 91.3* 73.2* 77.0*  NEUTROABS 81.7*  --   --   HGB 10.4*  8.9* 9.2*  HCT 32.3* 27.9* 28.2*  MCV 90.5 90.3 89.8  PLT 610* 415* 174   Basic Metabolic Panel: Recent Labs  Lab 01/21/20 1221 01/22/20 0531 01/23/20 0945  NA 130* 135 132*  K 3.7 3.2* 4.1  CL 97* 105 104  CO2 22 25 21*  GLUCOSE 104* 93 108*  BUN 36* 29* 22  CREATININE 1.07 0.86 0.84  CALCIUM 9.4 8.9 9.4  MG  --  1.3* 2.0  PHOS  --   --  2.5   GFR: Estimated Creatinine Clearance: 57.9 mL/min (by C-G formula based on SCr of 0.84 mg/dL). Liver Function Tests: Recent Labs  Lab 01/21/20 1221  AST 11*  ALT 8  ALKPHOS 161*  BILITOT 0.9  PROT 6.3*  ALBUMIN 2.3*   Recent Labs  Lab 01/21/20 1221  LIPASE 15   No results for input(s): AMMONIA in the last 168 hours. Coagulation Profile: Recent Labs  Lab 01/21/20 1221 01/22/20 0531  INR 1.1 1.2   Cardiac Enzymes: No results for input(s): CKTOTAL, CKMB, CKMBINDEX, TROPONINI in the last 168 hours. BNP (last 3 results) No results for input(s): PROBNP in the last 8760 hours. HbA1C: No results for input(s): HGBA1C in the last 72 hours. CBG: No results for input(s): GLUCAP in the last 168 hours. Lipid Profile: No results for input(s): CHOL, HDL, LDLCALC, TRIG, CHOLHDL, LDLDIRECT in the last 72 hours. Thyroid Function Tests: Recent Labs  01/22/20 0531  TSH 5.589*   Anemia Panel: No results for input(s): VITAMINB12, FOLATE, FERRITIN, TIBC, IRON, RETICCTPCT in the last 72 hours. Sepsis Labs: Recent Labs  Lab 01/21/20 1221 01/22/20 0531  PROCALCITON 1.08 1.43  LATICACIDVEN 1.9  --     Recent Results (from the past 240 hour(s))  Blood Culture (routine x 2)     Status: None (Preliminary result)   Collection Time: 01/21/20 12:21 PM   Specimen: BLOOD  Result Value Ref Range Status   Specimen Description   Final    BLOOD BLOOD RIGHT FOREARM Performed at [facility], [address] [place], [place] [address]    Special Requests   Final    BOTTLES DRAWN AEROBIC AND ANAEROBIC Blood Culture adequate  volume Performed at [facility], [address] [place], [place] [address]    Culture  Setup Time   Final    IN BOTH AEROBIC AND ANAEROBIC BOTTLES Performed at [facility], [address] [place], [place] [address]    Culture   Final    NO GROWTH 2 DAYS Performed at [facility], [place] [address] [place], [place] [address]    Report Status PENDING  Incomplete  Blood Culture (routine x 2)     Status: None (Preliminary result)   Collection Time: 01/21/20 12:21 PM   Specimen: BLOOD  Result Value Ref Range Status   Specimen Description   Final    BLOOD BLOOD LEFT ARM Performed at [facility], [address] [place], [place] [address]    Special Requests   Final    BOTTLES DRAWN AEROBIC AND ANAEROBIC Blood Culture results may not be optimal due to an inadequate volume of blood received in culture bottles Performed at [facility], [address], [place], [place] [address]    Culture  Setup Time   Final    ANAEROBIC BOTTLE ONLY Performed at [facility], [address] [place], [place] [address]    Culture   Final    NO GROWTH 2 DAYS Performed at [facility], [place] [address], [place], [place] [address]    Report Status PENDING  Incomplete  Urine culture     Status: Abnormal (Preliminary result)   Collection Time: 01/21/20 12:21 PM   Specimen: In/Out Cath Urine  Result Value Ref Range Status   Specimen Description   Final    IN/OUT CATH URINE Performed at [facility], [address] [place], [place] [address]    Special Requests   Final    NONE Performed at [facility], [address] [place], [place] [address]    Culture (A)  Final    >=100,000 COLONIES/mL GRAM NEGATIVE RODS IDENTIFICATION AND SUSCEPTIBILITIES TO FOLLOW Performed at [facility], [place] [address] [place], [place] [address]    Report Status PENDING  Incomplete  SARS CORONAVIRUS 2 (TAT 6-24 HRS) Nasopharyngeal  Nasopharyngeal Swab     Status: None   Collection Time: 01/21/20  3:05 PM   Specimen: Nasopharyngeal Swab  Result Value Ref Range Status   SARS Coronavirus 2 NEGATIVE NEGATIVE Final    Comment: (NOTE) SARS-CoV-2 target nucleic acids are NOT DETECTED. The SARS-CoV-2 RNA is generally detectable in upper and lower respiratory specimens during the acute phase of infection. Negative results do not preclude SARS-CoV-2 infection, do not rule out co-infections with other pathogens, and should not be used as the sole basis for treatment or other patient management  decisions. Negative results must be combined with clinical observations, patient history, and epidemiological information. The expected result is Negative. Fact Sheet for Patients: SugarRoll.be Fact Sheet for Healthcare Providers: https://www.woods-mathews.com/ This test is not yet approved or cleared by the [place] FDA and  has been authorized for detection and/or diagnosis of SARS-CoV-2 by FDA under an Emergency Use Authorization (EUA). This EUA will remain  in effect (meaning this test can be used) for the duration of the COVID-19 declaration under Section 56 4(b)(1) of the Act, 21 U.S.C. section 360bbb-3(b)(1), unless the authorization is terminated or revoked sooner. Performed at [facility], [place] [address] [place], [place] [address]          Radiology Studies: No results found.      Scheduled Meds: . Chlorhexidine Gluconate Cloth  6 each Topical Daily  . collagenase   Topical Daily  . enoxaparin (LOVENOX) injection  40 mg Subcutaneous Q24H  . feeding supplement (ENSURE ENLIVE)  237 mL Oral TID BM  . ferrous sulfate  325 mg Oral BID WC  . loratadine  10 mg Oral Daily  . mirabegron ER  50 mg Oral Daily  . multivitamin with minerals  1 tablet Oral Daily  . oxyCODONE  10 mg Oral Q12H  . vitamin B-12  500 mcg Oral Daily   Continuous Infusions: . ceFEPime  (MAXIPIME) IV 2 g (01/23/20 1356)  . vancomycin Stopped (01/23/20 1135)    Assessment & Plan:   Principal Problem:   Sepsis secondary to UTI [facility]) Active Problems:   Essential hypertension   Lumbar radiculitis   Malignant neoplasm of prostate (HCC)   OA (osteoarthritis) of hip   Malignant neoplasm of urinary bladder (HCC)   Complicated UTI (urinary tract infection)   Leukocytosis   Pressure injury of skin   Abdominal pain, generalized   Protein-calorie malnutrition, severe   Vitamin B12 deficiency   History of recurrent UTIs   Hyponatremia   Thrombocytosis (HCC)   Sepsis secondary to complicated UTI  ucx with GN rod. Awaiting sensitiv. Sepsis present on admission with leukocytosis, fever, tachycardia and UA consistent with infection.   --Continue IV hydration --Vancomycin and cefepime to cover for possible cellulitis and complicated UTI --Covid swab neg. ucx sens. Pending bcx gm + one cx -likely contaminant    Malignant neoplasm of urinary bladder/Malignant neoplasm of prostate  locally invasive with abdominal wall fistula, bilateral nephrostomies, colostomy. She was complaining of right nephrostomy tube pain per wife, urology consulted input appreciated no urgent urological intervention is recommended as both nephrostomy tubes appear to be draining well at this time. They are scheduled to be exchanged on April 9th.   Palliative care consulted, wife is willing to speak to hospice    Generalized abdominal pain -secondary to his bladder cancer and abdominal wall involvement Oncology has exhausted all treatment options for patient's cancer.  Palliative care and hospice have been recommended on multiple occasions and patient wife have declined so far.  Oncology was consulted during patient's January admission and reported nothing more to offer, supportive care only at this point. --Palliative consulted, also discussed with palliative care about getting hospice involved as  patient's wife willing to speak to them. --Pain management: continue home oxycodone 10 mg every 4 hours as needed    start OxyContin 10 mg twice daily for baseline pain coverage --Adjust pain medications as needed for patient's comfort --Wound care consulted for ostomy management  Hyponatremia -sodium 130 on admission, likely due to poor p.o.  intake and hypovolemia. Now improved --Expect improvement with IV hydration --Recheck BMP in the morning   Thrombocytosis -suspect reactive, platelets 610k on admission --Monitor CBC  Leukocytosis -has chronic leukocytosis usually in the 20ks, presented with white count of 91.3k.  Improving with some. Monitor CBC  Essential hypertension -appears no longer be on medications at home and blood pressure soft on presentation. Continue to monitor  Chronic pain secondary to locally advanced malignancy, lumbar radiculitis and osteoarthritis (hip) --Pain management as above --Palliative consult  Pressure injury of skin -present on admission --Wound care consulted  Protein-calorie malnutrition, severe --Dietitian to follow --Continue multivitamin  Hearing impairment -patient is extremely hard of hearing.       DVT prophylaxis: Lovenox Code Status: DNR Family Communication: Wife at bedside discussed everything with her Disposition Plan: 1 to 2 days when sensitivity returns and patient is able to be switched to p.o. antibiotics. Barrier; being treated for sepsis and needs ivabx as currently unable to switch to po abx at this time.       LOS: 2 days   Time spent: 45 minutes with more than 50% on [place], Vincent Poole [facility] Pager [phone]  If 7PM-7AM, please contact night-coverage [email or web address] Password [facility] 01/23/2020, 2:42 PM [name], male   DOB: Sep 07, 1935, 85 y.o.   MRN: [id number]

## 2020-01-23 NOTE — Progress Notes (Signed)
Follow up visit made to new referral for [facility] hospice services at home. Mrs. Mineer discussed her desire to continue treatment for patient's UTI. Writer assured her that continuing oral antibiotics at home was indeed part of the hospice plan of care. Reassurance given regarding hospice services and support. Will continue to follow through dscharge. Flo Shanks BSN, RN [facility] [phone]

## 2020-01-23 NOTE — Progress Notes (Signed)
Daily Progress Note   Patient Name: Vincent Poole       Date: 01/23/2020 DOB: 1935-07-22  Age: 85 y.o. MRN#: [id number] Attending Physician: Nolberto Hanlon, MD Primary Care Physician: [name], MD Admit Date: 01/21/2020  Reason for Consultation/Follow-up: Establishing goals of care  Subjective: Patient nonverbal, HOH - appears comfortable, sleeping. Wife at bedside. Tells me he is still confused, only complaint has been some pain at nephrostomy site that was adequately treated.  Length of Stay: 2  Current Medications: Scheduled Meds:  . Chlorhexidine Gluconate Cloth  6 each Topical Daily  . collagenase   Topical Daily  . enoxaparin (LOVENOX) injection  40 mg Subcutaneous Q24H  . feeding supplement (ENSURE ENLIVE)  237 mL Oral TID BM  . ferrous sulfate  325 mg Oral BID WC  . loratadine  10 mg Oral Daily  . mirabegron ER  50 mg Oral Daily  . multivitamin with minerals  1 tablet Oral Daily  . oxyCODONE  10 mg Oral Q12H  . vitamin B-12  500 mcg Oral Daily    Continuous Infusions: . ceFEPime (MAXIPIME) IV 2 g (01/23/20 1356)  . vancomycin Stopped (01/23/20 1135)    PRN Meds: acetaminophen **OR** acetaminophen, bisacodyl, lidocaine-prilocaine, magnesium citrate, ondansetron **OR** ondansetron (ZOFRAN) IV, oxyCODONE, polyethylene glycol, prochlorperazine, traMADol, traZODone  Physical Exam Constitutional:      General: He is not in acute distress.    Comments: Sleeping, flutters eyes to voice  Pulmonary:     Effort: Pulmonary effort is normal. No respiratory distress.  Skin:    General: Skin is warm and dry.  Neurological:     Mental Status: He is disoriented.             Vital Signs: BP (!) 97/54 (BP Location: Right Arm)   Pulse 87   Temp 98.3 F (36.8 C)   Resp 18   Ht  6' (1.829 m)   Wt 60.4 kg   SpO2 97%   BMI 18.07 kg/m  SpO2: SpO2: 97 % O2 Device: O2 Device: Room Air O2 Flow Rate:    Intake/output summary:   Intake/Output Summary (Last 24 hours) at 01/23/2020 1524 Last data filed at 01/23/2020 1457 Gross per 24 hour  Intake 768.72 ml  Output 1125 ml  Net -356.28 ml   LBM: Last BM Date: 01/22/20 Baseline Weight: Weight: 60.4 kg Most recent weight: Weight: 60.4 kg       Palliative Assessment/Data: PPS 30%    Flowsheet Rows     Most Recent Value  Intake Tab  Referral Department  Hospitalist  Unit at Time of Referral  Med/Surg Unit  Palliative Care Primary Diagnosis  Cancer  Date Notified  01/21/20  Palliative Care Type  Return patient Palliative Care  Reason for referral  Clarify Goals of Care  Date of Admission  01/21/20  Date first seen by Palliative Care  01/22/20  # of days Palliative referral response time  1 Day(s)  # of days IP prior to Palliative referral  0  Clinical Assessment  Palliative Performance Scale Score  30%  Psychosocial & Spiritual Assessment  Palliative Care Outcomes  Patient/Family meeting held?  Yes  Who was at the meeting?  wife  Palliative Care Outcomes  Clarified goals of care, Counseled regarding hospice, Transitioned to hospice      Patient Active Problem List   Diagnosis Date Noted  . Sepsis secondary to UTI ([place]) 01/21/2020  . Hyponatremia 01/21/2020  . Thrombocytosis ([place]) 01/21/2020  . Abdominal wall cellulitis 11/23/2019  . Intra-abdominal abscess ([place])   . Intra-abdominal infection 10/05/2019  . S/P partial resection of colon 07/17/2019  . Protein-calorie malnutrition, severe 06/11/2019  . Abdominal pain, generalized   . Palliative care by specialist   . Urothelial cancer ([place])   . Weakness   . Large bowel obstruction ([place]) 06/06/2019  . Hydronephrosis, right 04/08/2019  . Pressure injury of skin 03/23/2019  . SIRS (systemic inflammatory response syndrome) ([place]) 03/22/2019  .  Leukocytosis 02/21/2019  . Attention to nephrostomy ([place]) 11/22/2018  . Complicated UTI (urinary tract infection) 11/14/2018  . Palliative care encounter   . Iron deficiency anemia secondary to inadequate dietary iron intake 06/27/2018  . Vitamin B12 deficiency 06/27/2018  . History of fracture of right hip 06/26/2018  . History of normocytic normochromic anemia 06/26/2018  . Personal history of bladder cancer 06/26/2018  . Hip fracture ([place]) 05/01/2018  . Malignant neoplasm of urinary bladder ([place]) 01/12/2018  . Goals of care, counseling/discussion 01/12/2018  . History of recurrent UTIs 08/03/2017  . Pyelonephritis 05/31/2017  . UTI (urinary tract infection) 03/04/2017  . Urinary obstruction   . Essential hypertension 08/30/2016  . History of shingles 08/30/2016  . Prostate cancer ([place]) 08/30/2016  . Urinary retention 08/30/2016  . Medicare annual wellness visit, initial 08/01/2016  . Medicare annual wellness visit, subsequent 08/01/2016  . Sepsis ([place]) 07/11/2016  . Borderline diabetes mellitus 01/26/2016  . Vaccine counseling 01/26/2015  . Lumbar radiculitis 06/24/2014  . OA (osteoarthritis) of hip 06/24/2014  . Malignant neoplasm of prostate ([place]) 01/27/2011    Palliative Care Assessment & Plan   HPI: 85 y.o. male  with past medical history of locally invasive bladder cancer with bilateral nephrostomy tubes, prostate cancer, HTN, anemia, GERD, abdominal wall fistula, and colostomy admitted on 01/21/2020 with AMS and poor PO intake. Patient is being treated for sepsis secondary to UTI vs cellulitis. He has a history of recurrent UTIs. Patient has been seen by palliative care both inpatient and outpatient multiple times. PMT consulted for [facility].  Assessment: Called to bedside as wife had concerns that patient's infection would not be treated if he went home with hospice. We discussed that antibiotics can be continued. Wife expresses understanding that patient is dying from his  cancer - however, she would still like infections to be treated. She understands he is getting much closer to end of life. She would like hospice involved in care of patient. Emotional support provided.  Discussed above with Dr. Kurtis Bushman.   Recommendations/Plan: Hospice care at home PMT will follow throughout hospitalization  Code Status: DNR  Prognosis: ? weeks-months  Discharge Planning: Home with Hospice  Care plan was discussed with wife, hospice liaison, Dr. Kurtis Bushman  Thank you for allowing the Palliative Medicine Team to assist in the care of this patient.   Total Time 25 minutes Prolonged Time Billed  no       Greater than 50%  of this time was spent counseling and coordinating care related to the above assessment and plan.  Juel Burrow, DNP, [facility] Palliative Medicine Team Team Phone # [phone]  Pager [phone]

## 2020-01-23 NOTE — Progress Notes (Signed)
Initial Nutrition Assessment  DOCUMENTATION CODES:   Severe malnutrition in context of chronic illness, Underweight  INTERVENTION:  Will downgrade diet to dysphagia 2 (finely chopped) with thin liquids.  Continue Ensure Enlive po TID, each supplement provides 350 kcal and 20 grams of protein. Patient prefers chocolate.  Provide Magic cup TID with meals, each supplement provides 290 kcal and 9 grams of protein. Patient prefers chocolate.  Continue daily MVI.  Provide 1:1 assistance at meals when wife is not present.  Monitor magnesium, potassium, and phosphorus daily for at least 3 days, MD to replete as needed, as pt is at risk for refeeding syndrome given severe malnutrition.  NUTRITION DIAGNOSIS:   Severe Malnutrition related to chronic illness(locally invasive bladder cancer) as evidenced by severe fat depletion, severe muscle depletion.  GOAL:   Patient will meet greater than or equal to 90% of their needs  MONITOR:   PO intake, Supplement acceptance, Labs, Weight trends, Skin, I & O's  REASON FOR ASSESSMENT:   Malnutrition Screening Tool    ASSESSMENT:   85 year old male with PMHx of anxiety, arthritis, HTN, urinary retention, locally invasive bladder cancer with bilateral nephrostomies, frequent UTIs, iron deficiency anemia, with colostomy and abdominal wall fistula admitted with sepsis secondary to complicated UTI, generalized abdominal pain.   Met with patient and his wife at bedside. Patient is hard of hearing and confused so history provided by wife. She reports patient had been eating fairly well up until a few days PTA. She had been trying to increase his calorie/protein intake. For breakfast he would eat 2 eggs with grits. For lunch and dinner he would have soft meats, mashed or baked potato, beans, or other soft sides. He would drink up to 4 chocolate Ensure per day. He developed decreased appetite and worsening intake a few days before admission. He is currently  eating 20-50% of his meals here in the hospital. Patient is edentulous. Wife reports he has dentures but does not like to wear them. After discussion on diets available she reports dysphagia 2 will be easiest for patient to eat. He needs assistance with meals when wife is not present.  Wife reports patient's UBW was close to 200 lbs. She reports he is now around 130 lbs. According to chart patient is 60.4 kg (133.2 lbs). Weight loss occurred slowly over time.  Medications reviewed and include: ferrous sulfate 325 mg BID, MVI daily, vitamin B12 500 micrograms daily, cefepime, vancomycin.  Labs reviewed: Sodium 132, CO2 21.  NUTRITION - FOCUSED PHYSICAL EXAM:    Most Recent Value  Orbital Region  Severe depletion  Upper Arm Region  Severe depletion  Thoracic and Lumbar Region  Severe depletion  Buccal Region  Severe depletion  Temple Region  Severe depletion  Clavicle Bone Region  Severe depletion  Clavicle and Acromion Bone Region  Severe depletion  Scapular Bone Region  Severe depletion  Dorsal Hand  Severe depletion  Patellar Region  Severe depletion  Anterior Thigh Region  Severe depletion  Posterior Calf Region  Severe depletion  Hair  Reviewed  Eyes  Reviewed  Mouth  Reviewed [edentulous]  Skin  Reviewed  Nails  Reviewed     Diet Order:   Diet Order            Diet regular Room service appropriate? Yes; Fluid consistency: Thin  Diet effective now             EDUCATION NEEDS:   No education needs have been identified at  this time  Skin:  Skin Assessment: Skin Integrity Issues: Skin Integrity Issues:: DTI, Stage I DTI: sacrum, spine, left heel Stage I: sacrum  Last BM:  01/22/2020 per chart; pt with colostomy  Height:   Ht Readings from Last 1 Encounters:  01/21/20 6' (1.829 m)   Weight:   Wt Readings from Last 1 Encounters:  01/21/20 60.4 kg   Ideal Body Weight:  80.9 kg  BMI:  Body mass index is 18.07 kg/m.  Estimated Nutritional Needs:   Kcal:   1800-2000  Protein:  90-100 grams  Fluid:  1.5-1.8 L/day  Leanne King, MS, RD, LDN Pager number available on Amion 

## 2020-01-24 LAB — URINE CULTURE: Culture: >=100000 — AB

## 2020-01-24 NOTE — Care Management Important Message (Signed)
Important Message  Patient Details  Name: Vincent Poole MRN: [id number] Date of Birth: Feb 02, 1935   Medicare Important Message Given:  Yes     Juliann Pulse A Alyaan Budzynski 01/24/2020, 1:38 PM

## 2020-01-24 NOTE — Consult Note (Signed)
Pharmacy Antibiotic Note  Vincent Poole is a 85 y.o. male admitted on 12/31/8248 with complicated UTI/sepsis/cellulitis.  Pharmacy has been consulted for Vancomycin dosing. Hx: stage IV bladder cancer that is necrosing through the abdominal wall, sacral wound,    Plan: Will continue Cefepime 2g q12h  Pt received 1000mg  IV Vancomycin 3/16 @ 0370  Will start: Vancomycin 1000 mg IV Q 24 hrs.  Goal AUC 400-550. Expected AUC: 447 SCr used: 0.86  3/19: plan is to go Home w/Hospice    Height: 6' (182.9 cm) Weight: 133 lb 3.2 oz (60.4 kg) IBW/kg (Calculated) : 77.6  Temp (24hrs), Avg:98.1 F (36.7 C), Min:97.4 F (36.3 C), Max:98.6 F (37 C)  Recent Labs  Lab 01/21/20 1221 01/22/20 0531 01/23/20 0945  WBC 91.3* 73.2* 77.0*  CREATININE 1.07 0.86 0.84  LATICACIDVEN 1.9  --   --     Estimated Creatinine Clearance: 57.9 mL/min (by C-G formula based on SCr of 0.84 mg/dL).    No Known Allergies  Antimicrobials this admission: 3/16 Cefepime/Metronidazole/Vancomycin x 1  3/16 Cefepime >>  3/16 Vancomycin >>  Dose adjustments this admission: none  Microbiology results: 03/16 BCx: NG 03/16 UCx: ENTEROBACTER CLOACAE- Resist to Cefazolin, cipro, Zosyn  Thank you for allowing pharmacy to be a part of this patient's care.  Noralee Space, PharmD Clinical Pharmacist 01/24/2020 8:47 AM

## 2020-01-24 NOTE — Progress Notes (Addendum)
Follow up visit made to new referral for [facility] hospice services at home. Mrs. Biondolillo present at bedside feeding her husband. Patient appears more alert, but also appeared to be in pain. Discussed with Mrs. Kiang, she reports patient spit his pills this morning. Also discussed with staff RN Ander Purpura, she does have a new order for IV morphine, also discussed crushing oxycodone IR. Plan at this time is for patient to discharge home tomorrow. Referral updated. Please fax discharge summary to [phone]. Thank you. Flo Shanks BSN, RN, [place] [phone]

## 2020-01-24 NOTE — Progress Notes (Signed)
PROGRESS NOTE    Vincent Poole  PYP:[id number] DOB: Dec 16, 1934 DOA: 01/21/2020 PCP: [name], MD    Brief Narrative:   Vincent Poole is a 85 y.o. male with medical history significant of 85 year old male with history of locally invasive bladder cancer with bilateral nephrostsomies, colostomy and fistula history abdominal wall also covered with ostomy bag,prostate cancer, hypertension, iron deficiency anemia, GERD.  He presented to the ED today accompanied by his wife with altered mental status, poor p.o. intake and increased pain above his baseline.     Consultants:   urology  Procedures:   Antimicrobials:   Vanco and cefepime   Subjective: Confused , wife at bedside. Spited/?vomited pills today. Decrease po intake  Objective: Vitals:   01/23/20 1453 01/24/20 0049 01/24/20 0818 01/24/20 1559  BP: (!) 97/54 (!) 94/50 105/62 (!) 98/52  Pulse: 87 90 90 93  Resp: 18 16 16 16   Temp: 98.3 F (36.8 C) 98.6 F (37 C) (!) 97.4 F (36.3 C) 97.6 F (36.4 C)  TempSrc:    Oral  SpO2: 97% 97% 98% 100%  Weight:      Height:        Intake/Output Summary (Last 24 hours) at 01/24/2020 1718 Last data filed at 01/24/2020 0818 Gross per 24 hour  Intake 550.16 ml  Output 750 ml  Net -199.84 ml   Filed Weights   01/21/20 1207  Weight: 60.4 kg    Examination:  General exam: Appears calm and comfortable ,nad, eyes closed Respiratory system: Clear to auscultation. Poor resp. Effort, no wheezing Cardiovascular system: S1 & S2 heard, RRR. No murmurs, rubs, gallops or clicks.  Gastrointestinal system: Abdomen is nondistended, soft and nontender.  Normal bowel sounds heard. Bilateral nephrostomy tube in place.with urine Central nervous system: Confused not participating with exam Extremities: No edema Skin: Warm dry Psychiatry: Unable to assess    Data Reviewed: I have personally reviewed following labs and imaging studies  CBC: Recent Labs  Lab  01/21/20 1221 01/22/20 0531 01/23/20 0945  WBC 91.3* 73.2* 77.0*  NEUTROABS 81.7*  --   --   HGB 10.4* 8.9* 9.2*  HCT 32.3* 27.9* 28.2*  MCV 90.5 90.3 89.8  PLT 610* 415* 245   Basic Metabolic Panel: Recent Labs  Lab 01/21/20 1221 01/22/20 0531 01/23/20 0945  NA 130* 135 132*  K 3.7 3.2* 4.1  CL 97* 105 104  CO2 22 25 21*  GLUCOSE 104* 93 108*  BUN 36* 29* 22  CREATININE 1.07 0.86 0.84  CALCIUM 9.4 8.9 9.4  MG  --  1.3* 2.0  PHOS  --   --  2.5   GFR: Estimated Creatinine Clearance: 57.9 mL/min (by C-G formula based on SCr of 0.84 mg/dL). Liver Function Tests: Recent Labs  Lab 01/21/20 1221  AST 11*  ALT 8  ALKPHOS 161*  BILITOT 0.9  PROT 6.3*  ALBUMIN 2.3*   Recent Labs  Lab 01/21/20 1221  LIPASE 15   No results for input(s): AMMONIA in the last 168 hours. Coagulation Profile: Recent Labs  Lab 01/21/20 1221 01/22/20 0531  INR 1.1 1.2   Cardiac Enzymes: No results for input(s): CKTOTAL, CKMB, CKMBINDEX, TROPONINI in the last 168 hours. BNP (last 3 results) No results for input(s): PROBNP in the last 8760 hours. HbA1C: No results for input(s): HGBA1C in the last 72 hours. CBG: No results for input(s): GLUCAP in the last 168 hours. Lipid Profile: No results for input(s): CHOL, HDL, LDLCALC, TRIG, CHOLHDL, LDLDIRECT in the  last 72 hours. Thyroid Function Tests: Recent Labs    01/22/20 0531  TSH 5.589*   Anemia Panel: No results for input(s): VITAMINB12, FOLATE, FERRITIN, TIBC, IRON, RETICCTPCT in the last 72 hours. Sepsis Labs: Recent Labs  Lab 01/21/20 1221 01/22/20 0531  PROCALCITON 1.08 1.43  LATICACIDVEN 1.9  --     Recent Results (from the past 240 hour(s))  Blood Culture (routine x 2)     Status: None (Preliminary result)   Collection Time: 01/21/20 12:21 PM   Specimen: BLOOD  Result Value Ref Range Status   Specimen Description BLOOD BLOOD RIGHT FOREARM  Final   Special Requests   Final    BOTTLES DRAWN AEROBIC AND ANAEROBIC  Blood Culture adequate volume   Culture  Setup Time IN BOTH AEROBIC AND ANAEROBIC BOTTLES  Final   Culture   Final    NO GROWTH 3 DAYS Performed at [name], [address], [place], [place] [address]    Report Status PENDING  Incomplete  Blood Culture (routine x 2)     Status: None (Preliminary result)   Collection Time: 01/21/20 12:21 PM   Specimen: BLOOD  Result Value Ref Range Status   Specimen Description BLOOD BLOOD LEFT ARM  Final   Special Requests   Final    BOTTLES DRAWN AEROBIC AND ANAEROBIC Blood Culture results may not be optimal due to an inadequate volume of blood received in culture bottles   Culture  Setup Time ANAEROBIC BOTTLE ONLY  Final   Culture   Final    NO GROWTH 3 DAYS Performed at [facility], [address] [place], [place] [address]    Report Status PENDING  Incomplete  Urine culture     Status: Abnormal   Collection Time: 01/21/20 12:21 PM   Specimen: In/Out Cath Urine  Result Value Ref Range Status   Specimen Description IN/OUT CATH URINE  Final   Special Requests NONE  Final   Culture >=100,000 COLONIES/mL ENTEROBACTER CLOACAE (A)  Final   Report Status 01/24/2020 FINAL  Final   Organism ID, Bacteria ENTEROBACTER CLOACAE (A)  Final      Susceptibility   Enterobacter cloacae - MIC*    CEFAZOLIN >=64 RESISTANT Resistant     CEFEPIME 1 SENSITIVE Sensitive     CIPROFLOXACIN >=4 RESISTANT Resistant     GENTAMICIN <=1 SENSITIVE Sensitive     IMIPENEM 1 SENSITIVE Sensitive     NITROFURANTOIN 32 SENSITIVE Sensitive     TRIMETH/SULFA <=20 SENSITIVE Sensitive     PIP/TAZO Value in next row Resistant      >=128 RESISTANTPerformed at [place] [address] [place], [place] [address]    * >=100,000 COLONIES/mL ENTEROBACTER CLOACAE  SARS CORONAVIRUS 2 (TAT 6-24 HRS) Nasopharyngeal Nasopharyngeal Swab     Status: None   Collection Time: 01/21/20  3:05 PM   Specimen: Nasopharyngeal Swab  Result Value Ref Range Status    SARS Coronavirus 2 NEGATIVE NEGATIVE Final    Comment: (NOTE) SARS-CoV-2 target nucleic acids are NOT DETECTED. The SARS-CoV-2 RNA is generally detectable in upper and lower respiratory specimens during the acute phase of infection. Negative results do not preclude SARS-CoV-2 infection, do not rule out co-infections with other pathogens, and should not be used as the sole basis for treatment or other patient management decisions. Negative results must be combined with clinical observations, patient history, and epidemiological information. The expected result is Negative. Fact Sheet for Patients: SugarRoll.be Fact Sheet for Healthcare Providers:  https://www.woods-mathews.com/ This test is not yet approved or cleared by the [place] and  has been authorized for detection and/or diagnosis of SARS-CoV-2 by FDA under an Emergency Use Authorization (EUA). This EUA will remain  in effect (meaning this test can be used) for the duration of the COVID-19 declaration under Section 56 4(b)(1) of the Act, 21 U.S.C. section 360bbb-3(b)(1), unless the authorization is terminated or revoked sooner. Performed at [facility], [place] [address] [place], [place] [address]          Radiology Studies: No results found.      Scheduled Meds: . Chlorhexidine Gluconate Cloth  6 each Topical Daily  . collagenase   Topical Daily  . enoxaparin (LOVENOX) injection  40 mg Subcutaneous Q24H  . feeding supplement (ENSURE ENLIVE)  237 mL Oral TID BM  . ferrous sulfate  325 mg Oral BID WC  . loratadine  10 mg Oral Daily  . mirabegron ER  50 mg Oral Daily  . multivitamin with minerals  1 tablet Oral Daily  . oxyCODONE  10 mg Oral Q12H  . vitamin B-12  500 mcg Oral Daily   Continuous Infusions: . ceFEPime (MAXIPIME) IV 2 g (01/24/20 1545)  . vancomycin 1,000 mg (01/24/20 1021)    Assessment & Plan:   Principal Problem:   Sepsis secondary to  UTI [facility]) Active Problems:   Essential hypertension   Lumbar radiculitis   Malignant neoplasm of prostate (HCC)   OA (osteoarthritis) of hip   Malignant neoplasm of urinary bladder (HCC)   Complicated UTI (urinary tract infection)   Leukocytosis   Pressure injury of skin   Abdominal pain, generalized   Protein-calorie malnutrition, severe   Vitamin B12 deficiency   History of recurrent UTIs   Hyponatremia   Thrombocytosis (HCC)   Sepsis secondary to complicated UTI  ucx with GN rod. Awaiting sensitiv. Sepsis present on admission with leukocytosis, fever, tachycardia and UA consistent with infection.   --Continue IV hydration --Vancomycin and cefepime to cover for possible cellulitis and complicated UTI --Covid swab neg. ucx sens. EnterbFidela [place].Sensitive to bactrim. Side lined ID , ok to use bactrim x 14 days.  bcx gm + one cx -likely contaminant    Malignant neoplasm of urinary bladder/Malignant neoplasm of prostate  locally invasive with abdominal wall fistula, bilateral nephrostomies, colostomy. She was complaining of right nephrostomy tube pain per wife, urology consulted input appreciated no urgent urological intervention is recommended as both nephrostomy tubes appear to be draining well at this time. They are scheduled to be exchanged on April 9th.   Palliative/hospice on board    Generalized abdominal pain -secondary to his bladder cancer and abdominal wall involvement Oncology has exhausted all treatment options for patient's cancer.  Palliative care and hospice have been recommended on multiple occasions and patient wife have declined so far.  Oncology was consulted during patient's January admission and reported nothing more to offer, supportive care only at this point. --Palliative consulted, also discussed with palliative care about getting hospice involved as patient's wife willing to speak to them. --Pain management: continue home oxycodone 10 mg every 4 hours as  needed    start OxyContin 10 mg twice daily for baseline pain coverage --Adjust pain medications as needed for patient's comfort --Wound care consulted for ostomy management  Hyponatremia -sodium 130 on admission, likely due to poor p.o. intake and hypovolemia. Now improved Encouraged po intake  Thrombocytosis -suspect reactive, platelets 610k on admission --Monitor CBC  Leukocytosis -  has chronic leukocytosis usually in the 20ks, presented with white count of 91.3k.  Improving with some. Monitor CBC  Essential hypertension -appears no longer be on medications at home and blood pressure soft on presentation. Continue to monitor  Chronic pain secondary to locally advanced malignancy, lumbar radiculitis and osteoarthritis (hip) --Pain management as above --Palliative consult  Pressure injury of skin -present on admission --Wound care consulted  Protein-calorie malnutrition, severe --Dietitian to follow --Continue multivitamin  Hearing impairment -patient is extremely hard of hearing.       DVT prophylaxis: Lovenox Code Status: DNR Family Communication: Wife at bedside discussed everything with her Disposition Plan: unable to tolerate po intake. Return home when medically stable, possible tomorrow. Barrier; unable to take pills as he is not tolerating po intake. Vomited pills today. Will continue iv abx for complicated uti as he has nephrostomy tubes, if tolerates po intake will d/c in am.       LOS: 3 days   Time spent: 45 minutes with more than 50% on [place], MD [facility] Pager [phone]  If 7PM-7AM, please contact night-coverage [email or web address] Password [facility] 01/24/2020, 5:18 PM [name], male   DOB: 11-15-1934, 85 y.o.   MRN: [id number]

## 2020-01-24 NOTE — Consult Note (Signed)
PHARMACY CONSULT NOTE - FOLLOW UP  Pharmacy Consult for Electrolyte Monitoring and Replacement   Recent Labs: Potassium (mmol/L)  Date Value  01/23/2020 4.1   Magnesium (mg/dL)  Date Value  01/23/2020 2.0   Calcium (mg/dL)  Date Value  01/23/2020 9.4   Albumin (g/dL)  Date Value  01/21/2020 2.3 (L)   Phosphorus (mg/dL)  Date Value  01/23/2020 2.5   Sodium (mmol/L)  Date Value  01/23/2020 132 (L)     Assessment: Vincent Poole is a 85 y.o. male with medical history significant of 85 year old male with history of locally invasive bladder cancer with bilateral nephrostsomies, colostomy and fistula history abdominal wall also covered with ostomy bag,prostate cancer, hypertension, iron deficiency anemia, GERD.  He presents with altered mental status, poor p.o. intake and increased pain above his baseline, and sepsis.  Pharmacy has been consulted to monitor/replete electrolytes.  Goal of Therapy:  Electrolytes wnl's  Plan:  No electrolyte replenishment warranted today   Will f/u electrolytes with am labs.  Noralee Space, PharmD Clinical Pharmacist 01/24/2020 8:31 AM

## 2020-01-25 ENCOUNTER — Inpatient Hospital Stay: Payer: Medicare Other

## 2020-01-25 LAB — CBC
HCT: 28.5 % — ABNORMAL LOW (ref 39.0–52.0)
Hemoglobin: 9.2 g/dL — ABNORMAL LOW (ref 13.0–17.0)
MCH: 29.2 pg (ref 26.0–34.0)
MCHC: 32.3 g/dL (ref 30.0–36.0)
MCV: 90.5 fL (ref 80.0–100.0)
Platelets: 324 10*3/uL (ref 150–400)
RBC: 3.15 MIL/uL — ABNORMAL LOW (ref 4.22–5.81)
RDW: 15.9 % — ABNORMAL HIGH (ref 11.5–15.5)
WBC: 70 10*3/uL (ref 4.0–10.5)
nRBC: 0 % (ref 0.0–0.2)

## 2020-01-25 LAB — BASIC METABOLIC PANEL
Anion gap: 7 (ref 5–15)
BUN: 19 mg/dL (ref 8–23)
CO2: 21 mmol/L — ABNORMAL LOW (ref 22–32)
Calcium: 9.4 mg/dL (ref 8.9–10.3)
Chloride: 107 mmol/L (ref 98–111)
Creatinine, Ser: 0.83 mg/dL (ref 0.61–1.24)
GFR calc Af Amer: >60 mL/min (ref >60)
GFR calc non Af Amer: >60 mL/min (ref >60)
Glucose, Bld: 91 mg/dL (ref 70–99)
Potassium: 3.5 mmol/L (ref 3.5–5.1)
Sodium: 135 mmol/L (ref 135–145)

## 2020-01-25 LAB — PHOSPHORUS: Phosphorus: 3.1 mg/dL (ref 2.5–4.6)

## 2020-01-25 LAB — MAGNESIUM: Magnesium: 1.8 mg/dL (ref 1.7–2.4)

## 2020-01-25 MED ORDER — POTASSIUM CHLORIDE CRYS ER 20 MEQ PO TBCR: 40.0000 meq | EXTENDED_RELEASE_TABLET | Freq: Once | ORAL | Status: DC

## 2020-01-25 MED ORDER — IODIXANOL 320 MG/ML IV SOLN: 50.0000 mL | Freq: Once | INTRAVENOUS | Status: DC | PRN

## 2020-01-25 NOTE — Progress Notes (Signed)
Pt bed was wet with urine x2 this shift and left nephrostomy tube appears to be leaking page to Rufina Falco NP, she came to floor to assess pt and will follow any new orders.

## 2020-01-25 NOTE — Progress Notes (Signed)
CRITICAL VALUE STICKER  CRITICAL VALUE: WBC 70  RECEIVER (on-site recipient of call): Levada Schilling, RN  DATE & TIME NOTIFIED: 01/25/20 0758  MESSENGER (representative from lab): Colletta Maryland  MD NOTIFIED: Dr. Kurtis Bushman  TIME OF NOTIFICATION: 01/25/20 0076 via phone call  RESPONSE: No new orders at this time.   Additionally; this lab was drawn peripherally and NOT through the port-a-cath.

## 2020-01-25 NOTE — Procedures (Signed)
Interventional Radiology Procedure Note  Procedure: LEFT perc nephrostomy tube exchange  Complications: None  Estimated Blood Loss: None  Recommendations: - No barrier to DC from IR perspective - Return to IR in 8 weeks for tube exchange  Signed,  Criselda Peaches, MD

## 2020-01-25 NOTE — Consult Note (Signed)
PHARMACY CONSULT NOTE - FOLLOW UP  Pharmacy Consult for Electrolyte Monitoring and Replacement   Recent Labs: Potassium (mmol/L)  Date Value  01/25/2020 3.5   Magnesium (mg/dL)  Date Value  01/25/2020 1.8   Calcium (mg/dL)  Date Value  01/25/2020 9.4   Albumin (g/dL)  Date Value  01/21/2020 2.3 (L)   Phosphorus (mg/dL)  Date Value  01/25/2020 3.1   Sodium (mmol/L)  Date Value  01/25/2020 135     Assessment: Vincent Poole is a 85 y.o. male with medical history significant of 85 year old male with history of locally invasive bladder cancer with bilateral nephrostsomies, colostomy and fistula history abdominal wall also covered with ostomy bag,prostate cancer, hypertension, iron deficiency anemia, GERD.  He presents with altered mental status, poor p.o. intake and increased pain above his baseline, and sepsis.  Pharmacy has been consulted to monitor/replete electrolytes.  Goal of Therapy:  Electrolytes wnl's  Plan:  Will give potassium 40 mEq PO x 1.  Will f/u electrolytes with am labs.  Tawnya Crook, PharmD Clinical Pharmacist 01/25/2020 1:22 PM
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
## 2020-01-25 NOTE — Progress Notes (Addendum)
Notified by patient's RN that there was leakage from the Nephrostomy tubes. Upon closer assessment it appears that the drainage is coming from the Left Nephrostomy tube. There is also decreased urine output from the left tube compared to the right. On assessment it appears that the left tubing is out more than the right.  1. Leaking Nephrostomy tube - concerns for dislodgement/blockage -They are scheduled to be exchanged on April9th.However given new onset leakage and minimal urine output from the left Nephrostomy tube, would consider requesting Urology to re-evaluate. Will discussed this with am attending to follow up.    Rufina Falco, BSN, DNP, CCRN, FNP-C Software engineer

## 2020-01-25 NOTE — Progress Notes (Signed)
PROGRESS NOTE    Vincent Poole  GBT:[id number] DOB: 1934/12/10 DOA: 01/21/2020 PCP: [name], MD    Brief Narrative:   Vincent Poole is a 85 y.o. male with medical history significant of 85 year old male with history of locally invasive bladder cancer with bilateral nephrostsomies, colostomy and fistula history abdominal wall also covered with ostomy bag,prostate cancer, hypertension, iron deficiency anemia, GERD.  He presented to the ED today accompanied by his wife with altered mental status, poor p.o. intake and increased pain above his baseline.     Consultants:   urology  Procedures:   Antimicrobials:   Vanco and cefepime   Subjective: More awake today , less confused. Nephrostomy tube was leaking all night and now. nsg had to do lots of changing.  Wife is upset about this and would like the nephrostomy tube to be looked at  Objective: Vitals:   01/24/20 0818 01/24/20 1559 01/24/20 2123 01/25/20 0812  BP: 105/62 (!) 98/52 (!) 105/58 (!) 101/54  Pulse: 90 93 88 96  Resp: 16 16 18 18   Temp: (!) 97.4 F (36.3 C) 97.6 F (36.4 C) 98.1 F (36.7 C) 97.8 F (36.6 C)  TempSrc:  Oral Oral   SpO2: 98% 100% 100% 98%  Weight:      Height:        Intake/Output Summary (Last 24 hours) at 01/25/2020 1506 Last data filed at 01/25/2020 0500 Gross per 24 hour  Intake --  Output 450 ml  Net -450 ml   Filed Weights   01/21/20 1207  Weight: 60.4 kg    Examination:  General exam: Appears calm and comfortable ,nad, more interactive  Respiratory system: Clear to auscultation. Poor resp. Effort Cardiovascular system: S1 & S2 heard, RRR. No murmurs, rubs, gallops or clicks.  Gastrointestinal system: Abdomen is nondistended, soft and nontender.  Normal bowel sounds heard. Bilateral nephrostomy tube in place.with urine Central nervous system: Confused not participating with exam Extremities: No edema Skin: Warm dry Psychiatry: Unable to assess    Data  Reviewed: I have personally reviewed following labs and imaging studies  CBC: Recent Labs  Lab 01/21/20 1221 01/22/20 0531 01/23/20 0945 01/25/20 0704  WBC 91.3* 73.2* 77.0* 70.0*  NEUTROABS 81.7*  --   --   --   HGB 10.4* 8.9* 9.2* 9.2*  HCT 32.3* 27.9* 28.2* 28.5*  MCV 90.5 90.3 89.8 90.5  PLT 610* 415* 394 710   Basic Metabolic Panel: Recent Labs  Lab 01/21/20 1221 01/22/20 0531 01/23/20 0945 01/25/20 0704  NA 130* 135 132* 135  K 3.7 3.2* 4.1 3.5  CL 97* 105 104 107  CO2 22 25 21* 21*  GLUCOSE 104* 93 108* 91  BUN 36* 29* 22 19  CREATININE 1.07 0.86 0.84 0.83  CALCIUM 9.4 8.9 9.4 9.4  MG  --  1.3* 2.0 1.8  PHOS  --   --  2.5 3.1   GFR: Estimated Creatinine Clearance: 58.6 mL/min (by C-G formula based on SCr of 0.83 mg/dL). Liver Function Tests: Recent Labs  Lab 01/21/20 1221  AST 11*  ALT 8  ALKPHOS 161*  BILITOT 0.9  PROT 6.3*  ALBUMIN 2.3*   Recent Labs  Lab 01/21/20 1221  LIPASE 15   No results for input(s): AMMONIA in the last 168 hours. Coagulation Profile: Recent Labs  Lab 01/21/20 1221 01/22/20 0531  INR 1.1 1.2   Cardiac Enzymes: No results for input(s): CKTOTAL, CKMB, CKMBINDEX, TROPONINI in the last 168 hours. BNP (last 3 results)  No results for input(s): PROBNP in the last 8760 hours. HbA1C: No results for input(s): HGBA1C in the last 72 hours. CBG: No results for input(s): GLUCAP in the last 168 hours. Lipid Profile: No results for input(s): CHOL, HDL, LDLCALC, TRIG, CHOLHDL, LDLDIRECT in the last 72 hours. Thyroid Function Tests: No results for input(s): TSH, T4TOTAL, FREET4, T3FREE, THYROIDAB in the last 72 hours. Anemia Panel: No results for input(s): VITAMINB12, FOLATE, FERRITIN, TIBC, IRON, RETICCTPCT in the last 72 hours. Sepsis Labs: Recent Labs  Lab 01/21/20 1221 01/22/20 0531  PROCALCITON 1.08 1.43  LATICACIDVEN 1.9  --     Recent Results (from the past 240 hour(s))  Blood Culture (routine x 2)     Status:  None (Preliminary result)   Collection Time: 01/21/20 12:21 PM   Specimen: BLOOD  Result Value Ref Range Status   Specimen Description   Final    BLOOD BLOOD RIGHT FOREARM Performed at [facility], [address], [place], [place] [address]    Special Requests   Final    BOTTLES DRAWN AEROBIC AND ANAEROBIC Blood Culture adequate volume Performed at [facility], [address], [place], [place] [address]    Culture  Setup Time   Final    IN BOTH AEROBIC AND ANAEROBIC BOTTLES Performed at [facility], [address], [place], [place] [address]    Culture   Final    NO GROWTH 4 DAYS Performed at [facility], [place] [address] [place], [place] [address]    Report Status PENDING  Incomplete  Blood Culture (routine x 2)     Status: None (Preliminary result)   Collection Time: 01/21/20 12:21 PM   Specimen: BLOOD  Result Value Ref Range Status   Specimen Description   Final    BLOOD BLOOD LEFT ARM Performed at [facility], [address], [place], [place] [address]    Special Requests   Final    BOTTLES DRAWN AEROBIC AND ANAEROBIC Blood Culture results may not be optimal due to an inadequate volume of blood received in culture bottles Performed at [facility], [address] [place], [place] [address]    Culture  Setup Time   Final    ANAEROBIC BOTTLE ONLY Performed at [facility], [address], [place], [place] [address]    Culture   Final    NO GROWTH 4 DAYS Performed at [facility], [place] [address], [place], [place] [address]    Report Status PENDING  Incomplete  Urine culture     Status: Abnormal   Collection Time: 01/21/20 12:21 PM   Specimen: In/Out Cath Urine  Result Value Ref Range Status   Specimen Description IN/OUT CATH URINE  Final   Special Requests NONE  Final   Culture >=100,000 COLONIES/mL ENTEROBACTER CLOACAE (A)  Final   Report Status 01/24/2020 FINAL  Final   Organism ID,  Bacteria ENTEROBACTER CLOACAE (A)  Final      Susceptibility   Enterobacter cloacae - MIC*    CEFAZOLIN >=64 RESISTANT Resistant     CEFEPIME 1 SENSITIVE Sensitive     CIPROFLOXACIN >=4 RESISTANT Resistant     GENTAMICIN <=1 SENSITIVE Sensitive     IMIPENEM 1 SENSITIVE Sensitive     NITROFURANTOIN 32 SENSITIVE Sensitive     TRIMETH/SULFA <=20 SENSITIVE Sensitive     PIP/TAZO Value in next row Resistant      >=128 RESISTANTPerformed at [facility]  [address] [place], [place] [address]    * >=100,000 COLONIES/mL ENTEROBACTER CLOACAE  SARS CORONAVIRUS 2 (TAT 6-24 HRS) Nasopharyngeal Nasopharyngeal Swab     Status: None   Collection Time: 01/21/20  3:05 PM   Specimen: Nasopharyngeal Swab  Result Value Ref Range Status   SARS Coronavirus 2 NEGATIVE NEGATIVE Final    Comment: (NOTE) SARS-CoV-2 target nucleic acids are NOT DETECTED. The SARS-CoV-2 RNA is generally detectable in upper and lower respiratory specimens during the acute phase of infection. Negative results do not preclude SARS-CoV-2 infection, do not rule out co-infections with other pathogens, and should not be used as the sole basis for treatment or other patient management decisions. Negative results must be combined with clinical observations, patient history, and epidemiological information. The expected result is Negative. Fact Sheet for Patients: SugarRoll.be Fact Sheet for Healthcare Providers: https://www.woods-mathews.com/ This test is not yet approved or cleared by the [place] FDA and  has been authorized for detection and/or diagnosis of SARS-CoV-2 by FDA under an Emergency Use Authorization (EUA). This EUA will remain  in effect (meaning this test can be used) for the duration of the COVID-19 declaration under Section 56 4(b)(1) of the Act, 21 U.S.C. section 360bbb-3(b)(1), unless the authorization is terminated or revoked sooner. Performed at [facility], [place] [address], [place], [place] [address]          Radiology Studies: No results found.      Scheduled Meds: . Chlorhexidine Gluconate Cloth  6 each Topical Daily  . collagenase   Topical Daily  . enoxaparin (LOVENOX) injection  40 mg Subcutaneous Q24H  . feeding supplement (ENSURE ENLIVE)  237 mL Oral TID BM  . ferrous sulfate  325 mg Oral BID WC  . loratadine  10 mg Oral Daily  . mirabegron ER  50 mg Oral Daily  . multivitamin with minerals  1 tablet Oral Daily  . oxyCODONE  10 mg Oral Q12H  . potassium chloride  40 mEq Oral Once  . vitamin B-12  500 mcg Oral Daily   Continuous Infusions: . ceFEPime (MAXIPIME) IV 2 g (01/25/20 1340)  . vancomycin 1,000 mg (01/25/20 1426)    Assessment & Plan:   Principal Problem:   Sepsis secondary to UTI [facility]) Active Problems:   Essential hypertension   Lumbar radiculitis   Malignant neoplasm of prostate (HCC)   OA (osteoarthritis) of hip   Malignant neoplasm of urinary bladder (HCC)   Complicated UTI (urinary tract infection)   Leukocytosis   Pressure injury of skin   Abdominal pain, generalized   Protein-calorie malnutrition, severe   Vitamin B12 deficiency   History of recurrent UTIs   Hyponatremia   Thrombocytosis (HCC)   Sepsis secondary to complicated UTI  ucx with GN rod. Awaiting sensitiv. Sepsis present on admission with leukocytosis, fever, tachycardia and UA consistent with infection.   --Continue IV hydration --Vancomycin and cefepime to cover for possible cellulitis and complicated UTI --Covid swab neg. ucx sens. EnterbFidela Poole.Sensitive to bactrim. Side lined ID , ok to use bactrim x 14 days.  bcx gm + one cx -likely contaminant    Malignant neoplasm of urinary bladder/Malignant neoplasm of prostate  locally invasive with abdominal wall fistula, bilateral nephrostomies, colostomy. She was complaining of right nephrostomy tube pain per wife, urology consulted input appreciated no urgent  urological intervention is recommended as both nephrostomy tubes appear to be draining well at this time. They are scheduled to be exchanged on April 9th.   Palliative/hospice  on board Plan: IR to change Lt nephrotomy bag today  Generalized abdominal pain -secondary to his bladder cancer and abdominal wall involvement Oncology has exhausted all treatment options for patient's cancer.  Palliative care and hospice have been recommended on multiple occasions and patient wife have declined so far.  Oncology was consulted during patient's January admission and reported nothing more to offer, supportive care only at this point. --Palliative consulted, also discussed with palliative care about getting hospice involved as patient's wife willing to speak to them. --Pain management: continue home oxycodone 10 mg every 4 hours as needed    start OxyContin 10 mg twice daily for baseline pain coverage --Adjust pain medications as needed for patient's comfort --Wound care consulted for ostomy management  Hyponatremia -sodium 130 on admission, likely due to poor p.o. intake and hypovolemia. Now improved Encouraged po intake  Thrombocytosis -suspect reactive, platelets 610k on admission --Monitor CBC  Leukocytosis -has chronic leukocytosis usually in the 20ks, presented with white count of 91.3k.  Improving with some. Monitor CBC  Essential hypertension -appears no longer be on medications at home and blood pressure soft on presentation. Continue to monitor  Chronic pain secondary to locally advanced malignancy, lumbar radiculitis and osteoarthritis (hip) --Pain management as above --Palliative consult  Pressure injury of skin -present on admission --Wound care consulted  Protein-calorie malnutrition, severe --Dietitian to follow --Continue multivitamin  Hearing impairment -patient is extremely hard of hearing.       DVT prophylaxis: Lovenox Code Status: DNR Family Communication:  Wife at bedside discussed everything with her Disposition Plan: unable to tolerate po intake. Return home when medically stable, possible tomorrow. Barrier;Lt nephrostomy bag needs exchange as its leaking. IR will need to change this as tube not all the way in.     LOS: 4 days   Time spent: 45 minutes with more than 50% on [place], MD [facility] Pager [phone]  If 7PM-7AM, please contact night-coverage [email or web address] Password [facility] 01/25/2020, 3:06 PM [name], male   DOB: [birth year], 85 y.o.   MRN: [id number]

## 2020-01-26 LAB — CULTURE, BLOOD (ROUTINE X 2)
Culture: NO GROWTH
Culture: NO GROWTH
Special Requests: ADEQUATE

## 2020-01-26 MED ORDER — BISACODYL 5 MG PO TBEC: 5.0000 mg | DELAYED_RELEASE_TABLET | Freq: Every day | ORAL | 0 refills | Status: AC | PRN

## 2020-01-26 MED ORDER — SULFAMETHOXAZOLE-TRIMETHOPRIM 800-160 MG PO TABS
1.0000 | ORAL_TABLET | Freq: Two times a day (BID) | ORAL | Status: DC
  Filled 2020-01-26: qty 1

## 2020-01-26 MED ORDER — COLLAGENASE 250 UNIT/GM EX OINT: TOPICAL_OINTMENT | Freq: Every day | CUTANEOUS | 0 refills | Status: AC

## 2020-01-26 MED ORDER — SULFAMETHOXAZOLE-TRIMETHOPRIM 800-160 MG PO TABS: 1.0000 | ORAL_TABLET | Freq: Two times a day (BID) | ORAL | 0 refills | Status: DC

## 2020-01-26 NOTE — TOC Transition Note (Addendum)
Transition of Care [facility]) - CM/SW Discharge Note   Patient Details  Name: JOSON SAPP MRN: [id number] Date of Birth: [birth year]  [facility] of [facility]) CM/SW Contact:  Boris Sharper, LCSW Phone [phone] 01/26/2020, 11:03 AM   Clinical Narrative:    PT is medically stable for discharge. He will be going home with Hospice care. CSW spoke with Coralyn Mark at [facility] and notified her of discharge. CSW also notified pt's wife,. Pt will be transported  by EMS Home ([place], [place], [place] [address])   Final next level of care: Home w Hospice Care Barriers to Discharge: No Barriers Identified   Patient Goals and CMS Choice   CMS Medicare.gov Compare Post Acute Care list provided to:: Patient Represenative (must comment) Choice offered to / list presented to : Spouse  Discharge Placement                Patient to be transferred to facility by: EMS Name of family member notified: [name] Patient and family notified of of transfer: 01/26/20  Discharge Plan and Services   Discharge Planning Services: CM Consult                        [facility] Agency: [facility] Date [place]: 01/26/20 Time [place]: [place] Representative spoke with at [place]: [place] ([place]) Interventions     Readmission Risk Interventions Readmission Risk Prevention Plan 11/26/2019 10/29/2019 10/10/2019  Transportation Screening Complete Complete Complete  PCP or Specialist Appt within 3-5 Days - - -  [place] or [place] for [place] - - -  Medication Review Press photographer) - Complete Complete  PCP or Specialist appointment within 3-5 days of discharge Complete Complete -  [place] or [place] - Complete Patient refused  SW Recovery Care/Counseling Consult Complete - Complete  Palliative Care Screening Complete Patient Refused -   [place] Not Applicable Not Applicable -  Some recent data might be hidden

## 2020-01-26 NOTE — Consult Note (Signed)
PHARMACY CONSULT NOTE - FOLLOW UP  Pharmacy Consult for Electrolyte Monitoring and Replacement   Recent Labs: Potassium (mmol/L)  Date Value  01/25/2020 3.5   Magnesium (mg/dL)  Date Value  01/25/2020 1.8   Calcium (mg/dL)  Date Value  01/25/2020 9.4   Albumin (g/dL)  Date Value  01/21/2020 2.3 (L)   Phosphorus (mg/dL)  Date Value  01/25/2020 3.1   Sodium (mmol/L)  Date Value  01/25/2020 135     Assessment: JAVI BOLLMAN is a 85 y.o. male with medical history significant of 85 year old male with history of locally invasive bladder cancer with bilateral nephrostsomies, colostomy and fistula history abdominal wall also covered with ostomy bag,prostate cancer, hypertension, iron deficiency anemia, GERD.  He presents with altered mental status, poor p.o. intake and increased pain above his baseline, and sepsis.  Pharmacy has been consulted to monitor/replete electrolytes.  Goal of Therapy:  Electrolytes wnl's  Plan:  Patient did not receive potassium replacement ordered 3/20. No labs today. Plan to discharge per MD. Will sign off.  Tawnya Crook, PharmD Clinical Pharmacist 01/26/2020 10:47 AM

## 2020-01-26 NOTE — Discharge Summary (Signed)
AMARI ZAGAL VFI:[id number] DOB: 14-Oct-1935 DOA: 01/21/2020  PCP: [name], MD  Admit date: 01/21/2020 Discharge date: 01/26/2020  Admitted From: Home Disposition: Home with home hospice  Recommendations for Outpatient Follow-up:  1. Follow up with PCP in 1 week 2. Please obtain BMP/CBC in one week 3. Follow-up with urology on April 9 as scheduled 4. Follow-up interventional radiology in 8 weeks for tube exchange  Home Health: Home with hospice care   Discharge Condition:Stable CODE STATUS: DNR Diet recommendation: Dysphagia to Brief/Interim Summary: SAKARI ALKHATIB is a 85 y.o. male with medical history significant of 85 year old male with history of locally invasive bladder cancer with bilateral nephrostsomies, colostomy and fistula history abdominal wall also covered with ostomy bag,prostate cancer, hypertension, iron deficiency anemia, GERD.  He presented to the ED today accompanied by his wife with altered mental status, poor p.o. intake and increased pain above his baseline.  Patient is extremely hard of hearing, so most history was obtained by patient's wife at bedside.  She noticed patient seemed unwell on Saturday when he slept all day.  Also reports poor p.o. intake the last 2 days, and confusion that started this morning.  Wife reports his right nephrostomy tube has been leaking a lot and the surrounding skin has become red and irritated looking.  Patient was found with sepsis secondary to complicated UTI.  His urine culture grew Enterobacter cloacae.  I had sidelined ID about what to send patient home with since he has several resistance and medications that may affect his creatinine clearance and he recommended Bactrim.  He will need a total of 14 days which she has 9 more days of antibiotic treatment left.  He was treated with IV vancomycin and cefepime initially  while in the hospital.He had leukocytosis of 93 on admission decreased to the 70's. Thought was combo of  cancer and infection. His left nephrostomy tube was leaking.  Urology was consulted however initially they thought he does not need exchange and he can follow-up as outpatient on April 9 with Dr. Erlene Quan.  Since he continued having lots of leakage radiology was consulted and he had left percutaneous nephrostomy tube exchange yesterday.Also on admission he was found with hyponatremia with sodium of 130 likely due to poor p.o. intake and hypovolemia which improved.  Patient is extremely hard of hearing even yelling loudly he still is unable to hear.  During the hospitalization palliative care was consulted and patient's wife agreed to hospice care.  He is going home with home hospice and continuing his antibiotic treatment for his complicated UTI.  Plans and assessments were extensively discussed with the patient's wife who verbalizes an understanding.  Discharge Diagnoses:  Principal Problem:   Sepsis secondary to UTI [facility]) Active Problems:   Essential hypertension   Lumbar radiculitis   Malignant neoplasm of prostate (HCC)   OA (osteoarthritis) of hip   Malignant neoplasm of urinary bladder (HCC)   Complicated UTI (urinary tract infection)   Leukocytosis   Pressure injury of skin   Abdominal pain, generalized   Protein-calorie malnutrition, severe   Vitamin B12 deficiency   History of recurrent UTIs   Hyponatremia   Thrombocytosis ([place])    Discharge Instructions  Discharge Instructions    Call MD for:  temperature >100.4   Complete by: As directed    Diet - low sodium heart healthy   Complete by: As directed    Discharge instructions   Complete by: As directed    Follow up with urology  as planned Follow up with pcp in one week   Increase activity slowly   Complete by: As directed      Allergies as of 01/26/2020   No Known Allergies     Medication List    STOP taking these medications   ciprofloxacin 500 MG tablet Commonly known as: CIPRO     TAKE these medications    acetaminophen 500 MG tablet Commonly known as: TYLENOL Take 1,000 mg by mouth every 6 (six) hours as needed for moderate pain or headache.   bisacodyl 5 MG EC tablet Commonly known as: DULCOLAX Take 1 tablet (5 mg total) by mouth daily as needed for moderate constipation.   cetirizine 10 MG tablet Commonly known as: ZYRTEC Take 10 mg by mouth daily as needed for allergies.   collagenase ointment Commonly known as: SANTYL Apply topically daily.   feeding supplement (ENSURE ENLIVE) Liqd Take 237 mLs by mouth 2 (two) times daily between meals. What changed: when to take this   ferrous sulfate 325 (65 FE) MG tablet Take 1 tablet (325 mg total) by mouth 2 (two) times daily with a meal.   lidocaine-prilocaine cream Commonly known as: EMLA Apply 1 application topically as needed.   mirabegron ER 50 MG Tb24 tablet Commonly known as: Myrbetriq Take 1 tablet (50 mg total) by mouth daily.   MONOJECT PREFILL ADVANCED NACL 0.9 % Soln injection Generic drug: sodium chloride flush Inject 10 mLs into the vein daily.   multivitamin with minerals Tabs tablet Take 1 tablet by mouth daily.   nystatin powder Commonly known as: Nyamyc Apply topically 2 (two) times daily.   ondansetron 8 MG tablet Commonly known as: Zofran Take 1 tablet (8 mg total) by mouth 2 (two) times daily as needed for refractory nausea / vomiting. Start on day 3 after chemo.   Oxycodone HCl 10 MG Tabs Take 1 tablet (10 mg total) by mouth every 4 (four) hours as needed.   polyethylene glycol powder 17 GM/SCOOP powder Commonly known as: MiraLax Take 17 g by mouth daily as needed. Can increase to 3 times a day as needed for constipation but hold medication if has diarrhea   prochlorperazine 10 MG tablet Commonly known as: COMPAZINE Take 1 tablet (10 mg total) by mouth every 6 (six) hours as needed (Nausea or vomiting).   sulfamethoxazole-trimethoprim 800-160 MG tablet Commonly known as: BACTRIM DS Take 1  tablet by mouth every 12 (twelve) hours for 9 days.   Super Cranberry/Vitamin D3 4200-500 MG-UNIT Caps Generic drug: Cranberry-Cholecalciferol Take 1 capsule by mouth 2 (two) times daily.   traZODone 50 MG tablet Commonly known as: DESYREL Take 0.5 tablets (25 mg total) by mouth at bedtime as needed for sleep.   vitamin B-12 500 MCG tablet Commonly known as: CYANOCOBALAMIN Take 500 mcg by mouth daily.       No Known Allergies  Consultations:  IR, urology   Procedures/Studies: CT ABDOMEN PELVIS W CONTRAST  Result Date: 01/21/2020 CLINICAL DATA:  history of bladder and prostate cancer. EXAM: CT ABDOMEN AND PELVIS WITH CONTRAST TECHNIQUE: Multidetector CT imaging of the abdomen and pelvis was performed using the standard protocol following bolus administration of intravenous contrast. CONTRAST:  176mL OMNIPAQUE IOHEXOL 300 MG/ML  SOLN COMPARISON:  11/23/2019 FINDINGS: Lower chest: Small bilateral pleural effusions are again noted and appears slightly increased in volume from previous exam. Progressive masslike architectural distortion overlying the right lower lobe is favored to represent rounded atelectasis. Hepatobiliary: Cholecystectomy. No focal liver abnormality. No  biliary ductal dilatation. Pancreas: Unremarkable. No pancreatic ductal dilatation or surrounding inflammatory changes. Spleen: Spleen measures 11.4 cm in length. No focal splenic abnormality Adrenals/Urinary Tract: Normal appearance of the adrenal glands. Bilateral percutaneous nephrostomy tubes are in place. No right-sided hydronephrosis. Similar appearance of mild left hydronephrosis. Unchanged appearance of right kidney cyst. Large complex necrotic mass arising from the region of the bladder and prostate is again noted. This measures 13.9 x 9.8 by 10.3 cm (volume = 730 cm^3). Previously (by my measurements) this measures 12.2 x 9.2 by 11.3 cm (volume = 660 cm^3). Stomach/Bowel: Stomach is within normal limits. Appendix  appears normal. No evidence of bowel wall thickening, distention, or inflammatory changes. Transverse colostomy is identified along the midline of abdomen. Vascular/Lymphatic: Aortic atherosclerosis. No aneurysm. No abdominopelvic adenopathy. Reproductive: The large pelvic mass is directly contiguous and inseparable from the prostate gland. Other: No significant free fluid or fluid collections. Musculoskeletal: Previous ORIF of right proximal femur. No acute or suspicious bone abnormality. IMPRESSION: 1. Interval increase in volume of large complex necrotic tumor arising from the region of the bladder and prostate gland. 2. Bilateral percutaneous nephrostomy tubes are in place. Similar appearance of mild left hydronephrosis. 3. Small bilateral pleural effusions are slightly increased in volume from previous exam. 4. Progressive masslike architectural distortion overlying the right lower lobe is favored to represent rounded atelectasis. Attention on follow-up imaging advised. Aortic Atherosclerosis (ICD10-I70.0). Electronically Signed   By: Kerby Moors M.D.   On: 01/21/2020 14:33   DG Chest Port 1 View  Result Date: 01/21/2020 CLINICAL DATA:  Altered mental status.  Oral temperature 100.9 EXAM: PORTABLE CHEST 1 VIEW COMPARISON:  Moderate-sized right pleural effusion which may be FINDINGS: Unchanged position of a right chest infusion port catheter. Heart size within normal limits. Aortic atherosclerosis. New from prior examination, there is a moderate-sized right pleural effusion which may be partially loculated. Associated right basilar opacity. Diffuse chronic prominence of the interstitial lung markings. The left lung is otherwise clear. No evidence of pneumothorax. No acute bony abnormality. Thoracic spondylosis. IMPRESSION: New moderate-sized right pleural effusion which may be partially loculated. Right basilar atelectasis. Pneumonia at the right lung base cannot be excluded. Aortic atherosclerosis.  Electronically Signed   By: Kellie Simmering DO   On: 01/21/2020 13:24   IR NEPHROSTOMY EXCHANGE LEFT  Result Date: 01/03/2020 INDICATION: Bladder CA, chronic bilateral nephrostomies, routine exchange EXAM: FLUOROSCOPIC EXCHANGE OF THE BILATERAL 12 FRENCH NEPHROSTOMIES COMPARISON:  11/22/2019 MEDICATIONS: 1% LIDOCAINE LOCAL ANESTHESIA/SEDATION: Moderate Sedation Time:  None. The patient was continuously monitored during the procedure by the interventional radiology nurse under my direct supervision. CONTRAST:  10 cc-administered into the collecting system(s) FLUOROSCOPY TIME:  Fluoroscopy Time: 1 minutes 12 seconds (6.8 mGy). COMPLICATIONS: None immediate. PROCEDURE: Informed written consent was obtained from the patient after a thorough discussion of the procedural risks, benefits and alternatives. All questions were addressed. Maximal Sterile Barrier Technique was utilized including caps, mask, sterile gowns, sterile gloves, sterile drape, hand hygiene and skin antiseptic. A timeout was performed prior to the initiation of the procedure. Under sterile conditions and local anesthesia, the bilateral 12 [place] nephrostomy catheters were exchanged over Amplatz guidewires. Contrast injection confirms position in the renal pelvis bilaterally. Catheters secured with Prolene sutures and gravity drainage bags. Sterile dressings applied. No immediate complication. Patient tolerated the procedure well. IMPRESSION: Successful bilateral 12 [place] nephrostomy exchanges Electronically Signed   By: Jerilynn Mages.  Shick M.D.   On: 01/03/2020 09:11   IR NEPHROSTOMY EXCHANGE RIGHT  Result Date: 01/03/2020 INDICATION: Bladder CA, chronic bilateral nephrostomies, routine exchange EXAM: FLUOROSCOPIC EXCHANGE OF THE BILATERAL 12 FRENCH NEPHROSTOMIES COMPARISON:  11/22/2019 MEDICATIONS: 1% LIDOCAINE LOCAL ANESTHESIA/SEDATION: Moderate Sedation Time:  None. The patient was continuously monitored during the procedure by the interventional  radiology nurse under my direct supervision. CONTRAST:  10 cc-administered into the collecting system(s) FLUOROSCOPY TIME:  Fluoroscopy Time: 1 minutes 12 seconds (6.8 mGy). COMPLICATIONS: None immediate. PROCEDURE: Informed written consent was obtained from the patient after a thorough discussion of the procedural risks, benefits and alternatives. All questions were addressed. Maximal Sterile Barrier Technique was utilized including caps, mask, sterile gowns, sterile gloves, sterile drape, hand hygiene and skin antiseptic. A timeout was performed prior to the initiation of the procedure. Under sterile conditions and local anesthesia, the bilateral 12 French nephrostomy catheters were exchanged over Amplatz guidewires. Contrast injection confirms position in the renal pelvis bilaterally. Catheters secured with Prolene sutures and gravity drainage bags. Sterile dressings applied. No immediate complication. Patient tolerated the procedure well. IMPRESSION: Successful bilateral 12 [place] nephrostomy exchanges Electronically Signed   By: Jerilynn Mages.  Shick M.D.   On: 01/03/2020 09:11       Subjective: Very hard of hearing.  Difficult responding to questions due to hard of hearing.  Wife is irritated that patient is going home today even though we have discussed everything with her extensively multiple times on daily basis.  Discharge Exam: Vitals:   01/25/20 0812 01/25/20 2332  BP: (!) 101/54 (!) 107/53  Pulse: 96 94  Resp: 18 16  Temp: 97.8 F (36.6 C) 98.2 F (36.8 C)  SpO2: 98% 99%   Vitals:   01/24/20 1559 01/24/20 2123 01/25/20 0812 01/25/20 2332  BP: (!) 98/52 (!) 105/58 (!) 101/54 (!) 107/53  Pulse: 93 88 96 94  Resp: 16 18 18 16   Temp: 97.6 F (36.4 C) 98.1 F (36.7 C) 97.8 F (36.6 C) 98.2 F (36.8 C)  TempSrc: Oral Oral  Oral  SpO2: 100% 100% 98% 99%  Weight:      Height:        General: Pt is awake, NAD hard of hearing Cardiovascular: RRR, S1/S2 +, no rubs, no gallops Respiratory:  CTA bilaterally, no wheezing, no rhonchi Abdominal: Soft, positive ostomy tube, bilateral nephrostomy tube with urine Extremities: no edema    The results of significant diagnostics from this hospitalization (including imaging, microbiology, ancillary and laboratory) are listed below for reference.     Microbiology: Recent Results (from the past 240 hour(s))  Blood Culture (routine x 2)     Status: None   Collection Time: 01/21/20 12:21 PM   Specimen: BLOOD  Result Value Ref Range Status   Specimen Description BLOOD BLOOD RIGHT FOREARM  Final   Special Requests   Final    BOTTLES DRAWN AEROBIC AND ANAEROBIC Blood Culture adequate volume   Culture  Setup Time IN BOTH AEROBIC AND ANAEROBIC BOTTLES  Final   Culture   Final    NO GROWTH 5 DAYS Performed at [facility], [address] [place], [place] [address]    Report Status 01/26/2020 FINAL  Final  Blood Culture (routine x 2)     Status: None   Collection Time: 01/21/20 12:21 PM   Specimen: BLOOD  Result Value Ref Range Status   Specimen Description BLOOD BLOOD LEFT ARM  Final   Special Requests   Final    BOTTLES DRAWN AEROBIC AND ANAEROBIC Blood Culture results may not be optimal due to an inadequate  volume of blood received in culture bottles   Culture  Setup Time ANAEROBIC BOTTLE ONLY  Final   Culture   Final    NO GROWTH 5 DAYS Performed at [facility], [address] [place], [place] [address]    Report Status 01/26/2020 FINAL  Final  Urine culture     Status: Abnormal   Collection Time: 01/21/20 12:21 PM   Specimen: In/Out Cath Urine  Result Value Ref Range Status   Specimen Description IN/OUT CATH URINE  Final   Special Requests NONE  Final   Culture >=100,000 COLONIES/mL ENTEROBACTER CLOACAE (A)  Final   Report Status 01/24/2020 FINAL  Final   Organism ID, Bacteria ENTEROBACTER CLOACAE (A)  Final      Susceptibility   Enterobacter cloacae - MIC*    CEFAZOLIN >=64 RESISTANT Resistant      CEFEPIME 1 SENSITIVE Sensitive     CIPROFLOXACIN >=4 RESISTANT Resistant     GENTAMICIN <=1 SENSITIVE Sensitive     IMIPENEM 1 SENSITIVE Sensitive     NITROFURANTOIN 32 SENSITIVE Sensitive     TRIMETH/SULFA <=20 SENSITIVE Sensitive     PIP/TAZO Value in next row Resistant      >=128 RESISTANTPerformed at [facility] [address] [place], [place] [address]    * >=100,000 COLONIES/mL ENTEROBACTER CLOACAE  SARS CORONAVIRUS 2 (TAT 6-24 HRS) Nasopharyngeal Nasopharyngeal Swab     Status: None   Collection Time: 01/21/20  3:05 PM   Specimen: Nasopharyngeal Swab  Result Value Ref Range Status   SARS Coronavirus 2 NEGATIVE NEGATIVE Final    Comment: (NOTE) SARS-CoV-2 target nucleic acids are NOT DETECTED. The SARS-CoV-2 RNA is generally detectable in upper and lower respiratory specimens during the acute phase of infection. Negative results do not preclude SARS-CoV-2 infection, do not rule out co-infections with other pathogens, and should not be used as the sole basis for treatment or other patient management decisions. Negative results must be combined with clinical observations, patient history, and epidemiological information. The expected result is Negative. Fact Sheet for Patients: SugarRoll.be Fact Sheet for Healthcare Providers: https://www.woods-mathews.com/ This test is not yet approved or cleared by the [place] FDA and  has been authorized for detection and/or diagnosis of SARS-CoV-2 by FDA under an Emergency Use Authorization (EUA). This EUA will remain  in effect (meaning this test can be used) for the duration of the COVID-19 declaration under Section 56 4(b)(1) of the Act, 21 U.S.C. section 360bbb-3(b)(1), unless the authorization is terminated or revoked sooner. Performed at [facility], [place] [address] [place], [place] [address]      Labs: BNP (last 3 results) No results for input(s): BNP in the last  8760 hours. Basic Metabolic Panel: Recent Labs  Lab 01/21/20 1221 01/22/20 0531 01/23/20 0945 01/25/20 0704  NA 130* 135 132* 135  K 3.7 3.2* 4.1 3.5  CL 97* 105 104 107  CO2 22 25 21* 21*  GLUCOSE 104* 93 108* 91  BUN 36* 29* 22 19  CREATININE 1.07 0.86 0.84 0.83  CALCIUM 9.4 8.9 9.4 9.4  MG  --  1.3* 2.0 1.8  PHOS  --   --  2.5 3.1   Liver Function Tests: Recent Labs  Lab 01/21/20 1221  AST 11*  ALT 8  ALKPHOS 161*  BILITOT 0.9  PROT 6.3*  ALBUMIN 2.3*   Recent Labs  Lab 01/21/20 1221  LIPASE 15   No results for input(s): AMMONIA in the last 168 hours.  CBC: Recent Labs  Lab 01/21/20 1221 01/22/20 0531 01/23/20 0945 01/25/20 0704  WBC 91.3* 73.2* 77.0* 70.0*  NEUTROABS 81.7*  --   --   --   HGB 10.4* 8.9* 9.2* 9.2*  HCT 32.3* 27.9* 28.2* 28.5*  MCV 90.5 90.3 89.8 90.5  PLT 610* 415* 394 324   Cardiac Enzymes: No results for input(s): CKTOTAL, CKMB, CKMBINDEX, TROPONINI in the last 168 hours. BNP: Invalid input(s): POCBNP CBG: No results for input(s): GLUCAP in the last 168 hours. D-Dimer No results for input(s): DDIMER in the last 72 hours. Hgb A1c No results for input(s): HGBA1C in the last 72 hours. Lipid Profile No results for input(s): CHOL, HDL, LDLCALC, TRIG, CHOLHDL, LDLDIRECT in the last 72 hours. Thyroid function studies No results for input(s): TSH, T4TOTAL, T3FREE, THYROIDAB in the last 72 hours.  Invalid input(s): FREET3 Anemia work up No results for input(s): VITAMINB12, FOLATE, FERRITIN, TIBC, IRON, RETICCTPCT in the last 72 hours. Urinalysis    Component Value Date/Time   COLORURINE AMBER (A) 01/21/2020 1221   APPEARANCEUR TURBID (A) 01/21/2020 1221   APPEARANCEUR Cloudy (A) 10/23/2018 1327   LABSPEC 1.013 01/21/2020 1221   PHURINE 5.0 01/21/2020 1221   GLUCOSEU NEGATIVE 01/21/2020 1221   HGBUR MODERATE (A) 01/21/2020 1221   BILIRUBINUR NEGATIVE 01/21/2020 1221   BILIRUBINUR Negative 10/23/2018 1327   KETONESUR NEGATIVE  01/21/2020 1221   PROTEINUR 100 (A) 01/21/2020 1221   NITRITE NEGATIVE 01/21/2020 1221   LEUKOCYTESUR LARGE (A) 01/21/2020 1221   Sepsis Labs Invalid input(s): PROCALCITONIN,  WBC,  LACTICIDVEN Microbiology Recent Results (from the past 240 hour(s))  Blood Culture (routine x 2)     Status: None   Collection Time: 01/21/20 12:21 PM   Specimen: BLOOD  Result Value Ref Range Status   Specimen Description BLOOD BLOOD RIGHT FOREARM  Final   Special Requests   Final    BOTTLES DRAWN AEROBIC AND ANAEROBIC Blood Culture adequate volume   Culture  Setup Time IN BOTH AEROBIC AND ANAEROBIC BOTTLES  Final   Culture   Final    NO GROWTH 5 DAYS Performed at [facility], [address] [place], [place] [address]    Report Status 01/26/2020 FINAL  Final  Blood Culture (routine x 2)     Status: None   Collection Time: 01/21/20 12:21 PM   Specimen: BLOOD  Result Value Ref Range Status   Specimen Description BLOOD BLOOD LEFT ARM  Final   Special Requests   Final    BOTTLES DRAWN AEROBIC AND ANAEROBIC Blood Culture results may not be optimal due to an inadequate volume of blood received in culture bottles   Culture  Setup Time ANAEROBIC BOTTLE ONLY  Final   Culture   Final    NO GROWTH 5 DAYS Performed at [facility], [address] [place], [place] [address]    Report Status 01/26/2020 FINAL  Final  Urine culture     Status: Abnormal   Collection Time: 01/21/20 12:21 PM   Specimen: In/Out Cath Urine  Result Value Ref Range Status   Specimen Description IN/OUT CATH URINE  Final   Special Requests NONE  Final   Culture >=100,000 COLONIES/mL ENTEROBACTER CLOACAE (A)  Final   Report Status 01/24/2020 FINAL  Final   Organism ID, Bacteria ENTEROBACTER CLOACAE (A)  Final      Susceptibility   Enterobacter cloacae - MIC*    CEFAZOLIN >=64 RESISTANT Resistant     CEFEPIME 1 SENSITIVE Sensitive  CIPROFLOXACIN >=4 RESISTANT Resistant     GENTAMICIN <=1 SENSITIVE  Sensitive     IMIPENEM 1 SENSITIVE Sensitive     NITROFURANTOIN 32 SENSITIVE Sensitive     TRIMETH/SULFA <=20 SENSITIVE Sensitive     PIP/TAZO Value in next row Resistant      >=128 RESISTANTPerformed at [facility] [address], [place], [place] [address]    * >=100,000 COLONIES/mL ENTEROBACTER CLOACAE  SARS CORONAVIRUS 2 (TAT 6-24 HRS) Nasopharyngeal Nasopharyngeal Swab     Status: None   Collection Time: 01/21/20  3:05 PM   Specimen: Nasopharyngeal Swab  Result Value Ref Range Status   SARS Coronavirus 2 NEGATIVE NEGATIVE Final    Comment: (NOTE) SARS-CoV-2 target nucleic acids are NOT DETECTED. The SARS-CoV-2 RNA is generally detectable in upper and lower respiratory specimens during the acute phase of infection. Negative results do not preclude SARS-CoV-2 infection, do not rule out co-infections with other pathogens, and should not be used as the sole basis for treatment or other patient management decisions. Negative results must be combined with clinical observations, patient history, and epidemiological information. The expected result is Negative. Fact Sheet for Patients: SugarRoll.be Fact Sheet for Healthcare Providers: https://www.woods-mathews.com/ This test is not yet approved or cleared by the [place] FDA and  has been authorized for detection and/or diagnosis of SARS-CoV-2 by FDA under an Emergency Use Authorization (EUA). This EUA will remain  in effect (meaning this test can be used) for the duration of the COVID-19 declaration under Section 56 4(b)(1) of the Act, 21 U.S.C. section 360bbb-3(b)(1), unless the authorization is terminated or revoked sooner. Performed at [facility], [place] [address] [place], [place] [address]    Sepsis secondary tocomplicatedUTI ucx with GN rod. Awaiting sensitiv. Sepsis present on admission with leukocytosis, fever, tachycardia and UA consistent with infection.   --Continue IV hydration --Vancomycin and cefepime to cover for possible cellulitis and complicated UTI --Covid swab neg. ucx sens. EnterbFidela [place].Sensitive to bactrim. Side lined ID , ok to use bactrim x 14 days.  bcx gm + one cx -likely contaminant    Malignant neoplasm of urinary bladder/Malignant neoplasm of prostate locally invasive with abdominal wall fistula, bilateralnephrostomies, colostomy. S/p Left nephrostomy tube exchange on 01/25/2020.  Needs to come back to IR for replacement in 8 weeks Follow-up urology as outpatient on April 9 per previously scheduled   Generalized abdominal pain-secondary to his bladder cancer and abdominal wall involvement Oncology has exhausted all treatment options for patient's cancer. Palliative care and hospice have been recommended on multiple occasions and patient wife have declined so far. Oncology was consulted during patient's January admission and reported nothing more to offer, supportive care only at this point. Palliative and hospice consulted, will f/u pt as outpt.  Spoke to case manager today and hospice setup to see pt on tuesday    Hyponatremia-sodium 130 on admission, likely due to poor p.o. intake and hypovolemia. Now improved Encouraged po intake  Thrombocytosis-suspect reactive, platelets 610kon admission    Time coordinating discharge: Over 30 minutes  SIGNED:   Nolberto Hanlon, MD  [facility] 01/26/2020, 11:22 AM Pager   If 7PM-7AM, please contact night-coverage [email or web address] Password [email or web address]

## 2020-01-26 NOTE — Plan of Care (Signed)
Patient is being discharged home with hospice.  Education completed with spouse.  Case worker to call EMS for transport.

## 2020-01-27 HISTORY — PX: IR NEPHROSTOMY EXCHANGE LEFT: IMG6069

## 2020-02-03 ENCOUNTER — Ambulatory Visit
Admit: 2020-02-03 | Discharge: 2020-02-03 | Disposition: A | Discharge status: Home / Self Care | Payer: Medicare Other | Attending: Urology | Admitting: Urology

## 2020-02-03 ENCOUNTER — Emergency Department: Payer: Medicare Other

## 2020-02-03 ENCOUNTER — Other Ambulatory Visit: Payer: Medicare Other

## 2020-02-03 ENCOUNTER — Other Ambulatory Visit: Payer: Self-pay

## 2020-02-03 ENCOUNTER — Inpatient Hospital Stay: Payer: Medicare Other

## 2020-02-03 ENCOUNTER — Other Ambulatory Visit: Payer: Medicare Other | Admitting: Primary Care

## 2020-02-03 ENCOUNTER — Encounter: Payer: Self-pay | Admitting: Emergency Medicine

## 2020-02-03 ENCOUNTER — Inpatient Hospital Stay
Admission: EM | Admit: 2020-02-03 | Discharge: 2020-02-05 | DRG: 871 | Disposition: A | Discharge status: Hospice | Payer: Medicare Other | Attending: Internal Medicine | Admitting: Internal Medicine

## 2020-02-03 DIAGNOSIS — J90 Pleural effusion, not elsewhere classified: Secondary | ICD-10-CM | POA: Diagnosis present

## 2020-02-03 DIAGNOSIS — F05 Delirium due to known physiological condition: Secondary | ICD-10-CM | POA: Diagnosis present

## 2020-02-03 DIAGNOSIS — Z8249 Family history of ischemic heart disease and other diseases of the circulatory system: Secondary | ICD-10-CM

## 2020-02-03 DIAGNOSIS — H9190 Unspecified hearing loss, unspecified ear: Secondary | ICD-10-CM | POA: Diagnosis present

## 2020-02-03 DIAGNOSIS — G893 Neoplasm related pain (acute) (chronic): Secondary | ICD-10-CM | POA: Diagnosis present

## 2020-02-03 DIAGNOSIS — D72823 Leukemoid reaction: Secondary | ICD-10-CM | POA: Diagnosis present

## 2020-02-03 DIAGNOSIS — Z515 Encounter for palliative care: Secondary | ICD-10-CM | POA: Diagnosis not present

## 2020-02-03 DIAGNOSIS — C679 Malignant neoplasm of bladder, unspecified: Secondary | ICD-10-CM | POA: Diagnosis present

## 2020-02-03 DIAGNOSIS — N1330 Unspecified hydronephrosis: Secondary | ICD-10-CM | POA: Diagnosis not present

## 2020-02-03 DIAGNOSIS — R4182 Altered mental status, unspecified: Secondary | ICD-10-CM | POA: Diagnosis present

## 2020-02-03 DIAGNOSIS — Z8619 Personal history of other infectious and parasitic diseases: Secondary | ICD-10-CM

## 2020-02-03 DIAGNOSIS — D72829 Elevated white blood cell count, unspecified: Secondary | ICD-10-CM | POA: Diagnosis not present

## 2020-02-03 DIAGNOSIS — E538 Deficiency of other specified B group vitamins: Secondary | ICD-10-CM | POA: Diagnosis present

## 2020-02-03 DIAGNOSIS — Z66 Do not resuscitate: Secondary | ICD-10-CM | POA: Diagnosis present

## 2020-02-03 DIAGNOSIS — N186 End stage renal disease: Secondary | ICD-10-CM

## 2020-02-03 DIAGNOSIS — Z681 Body mass index (BMI) 19 or less, adult: Secondary | ICD-10-CM | POA: Diagnosis not present

## 2020-02-03 DIAGNOSIS — N139 Obstructive and reflux uropathy, unspecified: Secondary | ICD-10-CM | POA: Diagnosis not present

## 2020-02-03 DIAGNOSIS — N136 Pyonephrosis: Secondary | ICD-10-CM | POA: Diagnosis present

## 2020-02-03 DIAGNOSIS — Y732 Prosthetic and other implants, materials and accessory gastroenterology and urology devices associated with adverse incidents: Secondary | ICD-10-CM | POA: Diagnosis present

## 2020-02-03 DIAGNOSIS — E871 Hypo-osmolality and hyponatremia: Secondary | ICD-10-CM | POA: Diagnosis present

## 2020-02-03 DIAGNOSIS — B9689 Other specified bacterial agents as the cause of diseases classified elsewhere: Secondary | ICD-10-CM | POA: Diagnosis present

## 2020-02-03 DIAGNOSIS — Z9049 Acquired absence of other specified parts of digestive tract: Secondary | ICD-10-CM | POA: Diagnosis not present

## 2020-02-03 DIAGNOSIS — C61 Malignant neoplasm of prostate: Secondary | ICD-10-CM

## 2020-02-03 DIAGNOSIS — L89154 Pressure ulcer of sacral region, stage 4: Secondary | ICD-10-CM | POA: Diagnosis present

## 2020-02-03 DIAGNOSIS — E43 Unspecified severe protein-calorie malnutrition: Secondary | ICD-10-CM | POA: Diagnosis present

## 2020-02-03 DIAGNOSIS — Z79891 Long term (current) use of opiate analgesic: Secondary | ICD-10-CM

## 2020-02-03 DIAGNOSIS — I10 Essential (primary) hypertension: Secondary | ICD-10-CM | POA: Diagnosis present

## 2020-02-03 DIAGNOSIS — Z8546 Personal history of malignant neoplasm of prostate: Secondary | ICD-10-CM | POA: Diagnosis not present

## 2020-02-03 DIAGNOSIS — A419 Sepsis, unspecified organism: Principal | ICD-10-CM | POA: Diagnosis present

## 2020-02-03 DIAGNOSIS — R339 Retention of urine, unspecified: Secondary | ICD-10-CM | POA: Diagnosis not present

## 2020-02-03 DIAGNOSIS — Z87440 Personal history of urinary (tract) infections: Secondary | ICD-10-CM

## 2020-02-03 DIAGNOSIS — T83022A Displacement of nephrostomy catheter, initial encounter: Secondary | ICD-10-CM

## 2020-02-03 DIAGNOSIS — Z20822 Contact with and (suspected) exposure to covid-19: Secondary | ICD-10-CM | POA: Diagnosis present

## 2020-02-03 DIAGNOSIS — Z8719 Personal history of other diseases of the digestive system: Secondary | ICD-10-CM

## 2020-02-03 DIAGNOSIS — N99522 Malfunction of other external stoma of urinary tract: Secondary | ICD-10-CM | POA: Diagnosis not present

## 2020-02-03 DIAGNOSIS — N1339 Other hydronephrosis: Secondary | ICD-10-CM

## 2020-02-03 DIAGNOSIS — N3281 Overactive bladder: Secondary | ICD-10-CM | POA: Diagnosis present

## 2020-02-03 DIAGNOSIS — Z933 Colostomy status: Secondary | ICD-10-CM

## 2020-02-03 DIAGNOSIS — Z841 Family history of disorders of kidney and ureter: Secondary | ICD-10-CM

## 2020-02-03 DIAGNOSIS — C799 Secondary malignant neoplasm of unspecified site: Secondary | ICD-10-CM | POA: Diagnosis present

## 2020-02-03 DIAGNOSIS — N390 Urinary tract infection, site not specified: Secondary | ICD-10-CM | POA: Diagnosis not present

## 2020-02-03 DIAGNOSIS — Z79899 Other long term (current) drug therapy: Secondary | ICD-10-CM

## 2020-02-03 DIAGNOSIS — G894 Chronic pain syndrome: Secondary | ICD-10-CM | POA: Diagnosis not present

## 2020-02-03 DIAGNOSIS — D508 Other iron deficiency anemias: Secondary | ICD-10-CM | POA: Diagnosis present

## 2020-02-03 HISTORY — PX: IR NEPHROSTOMY EXCHANGE RIGHT: IMG6070

## 2020-02-03 HISTORY — DX: Non-pressure chronic ulcer of back with necrosis of bone: L98.424

## 2020-02-03 LAB — CBC WITH DIFFERENTIAL/PLATELET
Abs Immature Granulocytes: 2.38 10*3/uL — ABNORMAL HIGH (ref 0.00–0.07)
Basophils Absolute: 0.1 10*3/uL (ref 0.0–0.1)
Basophils Relative: 0 %
Eosinophils Absolute: 0.5 10*3/uL (ref 0.0–0.5)
Eosinophils Relative: 1 %
HCT: 32.4 % — ABNORMAL LOW (ref 39.0–52.0)
Hemoglobin: 10.5 g/dL — ABNORMAL LOW (ref 13.0–17.0)
Immature Granulocytes: 3 %
Lymphocytes Relative: 6 %
Lymphs Abs: 4.4 10*3/uL — ABNORMAL HIGH (ref 0.7–4.0)
MCH: 30 pg (ref 26.0–34.0)
MCHC: 32.4 g/dL (ref 30.0–36.0)
MCV: 92.6 fL (ref 80.0–100.0)
Monocytes Absolute: 2.3 10*3/uL — ABNORMAL HIGH (ref 0.1–1.0)
Monocytes Relative: 3 %
Neutro Abs: 60.9 10*3/uL — ABNORMAL HIGH (ref 1.7–7.7)
Neutrophils Relative %: 87 %
Platelets: 431 10*3/uL — ABNORMAL HIGH (ref 150–400)
RBC: 3.5 MIL/uL — ABNORMAL LOW (ref 4.22–5.81)
RDW: 17.7 % — ABNORMAL HIGH (ref 11.5–15.5)
WBC: 70.5 10*3/uL (ref 4.0–10.5)
nRBC: 0 % (ref 0.0–0.2)

## 2020-02-03 LAB — COMPREHENSIVE METABOLIC PANEL
ALT: 6 U/L (ref 0–44)
ALT: 6 U/L (ref 0–44)
AST: 21 U/L (ref 15–41)
AST: 8 U/L — ABNORMAL LOW (ref 15–41)
Albumin: 2.4 g/dL — ABNORMAL LOW (ref 3.5–5.0)
Albumin: 2 g/dL — ABNORMAL LOW (ref 3.5–5.0)
Alkaline Phosphatase: 168 U/L — ABNORMAL HIGH (ref 38–126)
Alkaline Phosphatase: 125 U/L (ref 38–126)
Anion gap: 12 (ref 5–15)
Anion gap: 5 (ref 5–15)
BUN: 20 mg/dL (ref 8–23)
BUN: 19 mg/dL (ref 8–23)
CO2: 21 mmol/L — ABNORMAL LOW (ref 22–32)
CO2: 24 mmol/L (ref 22–32)
Calcium: 9.5 mg/dL (ref 8.9–10.3)
Calcium: 9 mg/dL (ref 8.9–10.3)
Chloride: 98 mmol/L (ref 98–111)
Chloride: 100 mmol/L (ref 98–111)
Creatinine, Ser: 1.27 mg/dL — ABNORMAL HIGH (ref 0.61–1.24)
Creatinine, Ser: 1.02 mg/dL (ref 0.61–1.24)
GFR calc Af Amer: >60 mL/min (ref >60)
GFR calc Af Amer: >60 mL/min (ref >60)
GFR calc non Af Amer: 52 mL/min — ABNORMAL LOW (ref >60)
GFR calc non Af Amer: >60 mL/min (ref >60)
Glucose, Bld: 82 mg/dL (ref 70–99)
Glucose, Bld: 85 mg/dL (ref 70–99)
Potassium: 5.7 mmol/L — ABNORMAL HIGH (ref 3.5–5.1)
Potassium: 4.5 mmol/L (ref 3.5–5.1)
Sodium: 131 mmol/L — ABNORMAL LOW (ref 135–145)
Sodium: 129 mmol/L — ABNORMAL LOW (ref 135–145)
Total Bilirubin: 1.1 mg/dL (ref 0.3–1.2)
Total Bilirubin: 0.3 mg/dL (ref 0.3–1.2)
Total Protein: 6.6 g/dL (ref 6.5–8.1)
Total Protein: 5.3 g/dL — ABNORMAL LOW (ref 6.5–8.1)

## 2020-02-03 LAB — URINALYSIS, COMPLETE (UACMP) WITH MICROSCOPIC
Bilirubin Urine: NEGATIVE
Glucose, UA: NEGATIVE mg/dL
Ketones, ur: NEGATIVE mg/dL
Nitrite: NEGATIVE
Protein, ur: 30 mg/dL — AB
Specific Gravity, Urine: 1.006 (ref 1.005–1.030)
Squamous Epithelial / HPF: NONE SEEN (ref 0–5)
pH: 6 (ref 5.0–8.0)

## 2020-02-03 LAB — URINE CULTURE

## 2020-02-03 LAB — CBC
HCT: 25.9 % — ABNORMAL LOW (ref 39.0–52.0)
Hemoglobin: 8.5 g/dL — ABNORMAL LOW (ref 13.0–17.0)
MCH: 30 pg (ref 26.0–34.0)
MCHC: 32.8 g/dL (ref 30.0–36.0)
MCV: 91.5 fL (ref 80.0–100.0)
Platelets: 322 10*3/uL (ref 150–400)
RBC: 2.83 MIL/uL — ABNORMAL LOW (ref 4.22–5.81)
RDW: 17.2 % — ABNORMAL HIGH (ref 11.5–15.5)
WBC: 56.2 10*3/uL (ref 4.0–10.5)
nRBC: 0 % (ref 0.0–0.2)

## 2020-02-03 LAB — LACTIC ACID, PLASMA
Lactic Acid, Venous: 2.9 mmol/L (ref 0.5–1.9)
Lactic Acid, Venous: 1.6 mmol/L (ref 0.5–1.9)

## 2020-02-03 LAB — RESPIRATORY PANEL BY RT PCR (FLU A&B, COVID)
Influenza A by PCR: NEGATIVE
Influenza B by PCR: NEGATIVE
SARS Coronavirus 2 by RT PCR: NEGATIVE

## 2020-02-03 LAB — BRAIN NATRIURETIC PEPTIDE: B Natriuretic Peptide: 714 pg/mL — ABNORMAL HIGH (ref 0.0–100.0)

## 2020-02-03 LAB — LIPASE, BLOOD: Lipase: 17 U/L (ref 11–51)

## 2020-02-03 LAB — PROTIME-INR
INR: 1.1 (ref 0.8–1.2)
Prothrombin Time: 14 seconds (ref 11.4–15.2)

## 2020-02-03 LAB — PROCALCITONIN: Procalcitonin: 0.85 ng/mL

## 2020-02-03 MED ORDER — BISACODYL 5 MG PO TBEC: 5.0000 mg | DELAYED_RELEASE_TABLET | Freq: Every day | ORAL | Status: DC | PRN

## 2020-02-03 MED ORDER — COLLAGENASE 250 UNIT/GM EX OINT
1.0000 "application " | TOPICAL_OINTMENT | Freq: Every day | CUTANEOUS | Status: DC
  Administered 2020-02-04 – 2020-02-05 (×2): 1 via TOPICAL
  Filled 2020-02-03: qty 30

## 2020-02-03 MED ORDER — CHLORHEXIDINE GLUCONATE CLOTH 2 % EX PADS
6.0000 | MEDICATED_PAD | Freq: Every day | CUTANEOUS | Status: DC
  Administered 2020-02-03 – 2020-02-05 (×3): 6 via TOPICAL

## 2020-02-03 MED ORDER — IOPAMIDOL (ISOVUE-300) INJECTION 61%: 20.0000 mL | Freq: Once | INTRAVENOUS | Status: DC | PRN

## 2020-02-03 MED ORDER — IOHEXOL 300 MG/ML  SOLN
100.0000 mL | Freq: Once | INTRAMUSCULAR | Status: AC | PRN
  Administered 2020-02-03: 03:00:00 75 mL via INTRAVENOUS

## 2020-02-03 MED ORDER — ACETAMINOPHEN 325 MG PO TABS
650.0000 mg | ORAL_TABLET | Freq: Four times a day (QID) | ORAL | Status: DC | PRN
  Administered 2020-02-03 – 2020-02-04 (×2): 650 mg via ORAL
  Filled 2020-02-03 (×2): qty 2

## 2020-02-03 MED ORDER — ENOXAPARIN SODIUM 40 MG/0.4ML ~~LOC~~ SOLN
40.0000 mg | SUBCUTANEOUS | Status: DC
  Administered 2020-02-04 – 2020-02-05 (×2): 40 mg via SUBCUTANEOUS
  Filled 2020-02-03 (×2): qty 0.4

## 2020-02-03 MED ORDER — CYANOCOBALAMIN 500 MCG PO TABS
500.0000 ug | ORAL_TABLET | Freq: Every day | ORAL | Status: DC
  Filled 2020-02-03: qty 1

## 2020-02-03 MED ORDER — ACETAMINOPHEN 650 MG RE SUPP: 650.0000 mg | Freq: Four times a day (QID) | RECTAL | Status: DC | PRN

## 2020-02-03 MED ORDER — LORATADINE 10 MG PO TABS: 10.0000 mg | ORAL_TABLET | Freq: Every day | ORAL | Status: DC

## 2020-02-03 MED ORDER — SODIUM CHLORIDE 0.9 % IV SOLN
2.0000 g | Freq: Once | INTRAVENOUS | Status: AC
  Administered 2020-02-03: 03:00:00 2 g via INTRAVENOUS

## 2020-02-03 MED ORDER — SODIUM CHLORIDE 0.9 % IV SOLN: INTRAVENOUS | Status: DC

## 2020-02-03 MED ORDER — ONDANSETRON HCL 4 MG/2ML IJ SOLN: 4.0000 mg | Freq: Four times a day (QID) | INTRAMUSCULAR | Status: DC | PRN

## 2020-02-03 MED ORDER — LACTATED RINGERS IV BOLUS (SEPSIS)
1000.0000 mL | Freq: Once | INTRAVENOUS | Status: AC
  Administered 2020-02-03: 03:00:00 1000 mL via INTRAVENOUS

## 2020-02-03 MED ORDER — MAGNESIUM HYDROXIDE 400 MG/5ML PO SUSP
30.0000 mL | Freq: Every day | ORAL | Status: DC | PRN
  Administered 2020-02-04: 30 mL via ORAL
  Filled 2020-02-03 (×2): qty 30

## 2020-02-03 MED ORDER — MORPHINE SULFATE (PF) 2 MG/ML IV SOLN
2.0000 mg | INTRAVENOUS | Status: DC | PRN
  Administered 2020-02-03 – 2020-02-04 (×2): 2 mg via INTRAVENOUS
  Filled 2020-02-03 (×2): qty 1

## 2020-02-03 MED ORDER — ONDANSETRON HCL 4 MG PO TABS: 4.0000 mg | ORAL_TABLET | Freq: Four times a day (QID) | ORAL | Status: DC | PRN

## 2020-02-03 MED ORDER — SODIUM CHLORIDE 0.9% FLUSH
10.0000 mL | Freq: Every day | INTRAVENOUS | Status: DC
  Administered 2020-02-03 – 2020-02-04 (×2): 10 mL via INTRAVENOUS

## 2020-02-03 MED ORDER — ENSURE ENLIVE PO LIQD
237.0000 mL | Freq: Three times a day (TID) | ORAL | Status: DC
  Administered 2020-02-04 (×2): 237 mL via ORAL

## 2020-02-03 MED ORDER — FERROUS SULFATE 325 (65 FE) MG PO TABS
325.0000 mg | ORAL_TABLET | Freq: Two times a day (BID) | ORAL | Status: DC
  Filled 2020-02-03: qty 1

## 2020-02-03 MED ORDER — OXYCODONE HCL 5 MG PO TABS
5.0000 mg | ORAL_TABLET | ORAL | Status: DC | PRN
  Administered 2020-02-04: 10:00:00 5 mg via ORAL
  Filled 2020-02-03: qty 1

## 2020-02-03 MED ORDER — ADULT MULTIVITAMIN W/MINERALS CH: 1.0000 | ORAL_TABLET | Freq: Every day | ORAL | Status: DC

## 2020-02-03 MED ORDER — LIDOCAINE-PRILOCAINE 2.5-2.5 % EX CREA
1.0000 "application " | TOPICAL_CREAM | CUTANEOUS | Status: DC | PRN
  Filled 2020-02-03: qty 5

## 2020-02-03 MED ORDER — MORPHINE SULFATE (PF) 4 MG/ML IV SOLN
4.0000 mg | Freq: Once | INTRAVENOUS | Status: AC
  Administered 2020-02-03: 4 mg via INTRAVENOUS
  Filled 2020-02-03: qty 1

## 2020-02-03 MED ORDER — TRAZODONE HCL 50 MG PO TABS
25.0000 mg | ORAL_TABLET | Freq: Every evening | ORAL | Status: DC | PRN
  Filled 2020-02-03: qty 1

## 2020-02-03 MED ORDER — LACTATED RINGERS IV BOLUS (SEPSIS)
1000.0000 mL | Freq: Once | INTRAVENOUS | Status: AC
  Administered 2020-02-03: 1000 mL via INTRAVENOUS

## 2020-02-03 MED ORDER — OXYCODONE HCL 5 MG PO TABS
10.0000 mg | ORAL_TABLET | ORAL | Status: DC | PRN
  Administered 2020-02-04 – 2020-02-05 (×2): 10 mg via ORAL
  Filled 2020-02-03 (×2): qty 2

## 2020-02-03 MED ORDER — SODIUM CHLORIDE 0.9 % IV SOLN
2.0000 g | Freq: Two times a day (BID) | INTRAVENOUS | Status: DC
  Administered 2020-02-03 – 2020-02-04 (×3): 2 g via INTRAVENOUS
  Filled 2020-02-03 (×5): qty 2

## 2020-02-03 MED ORDER — CRANBERRY-CHOLECALCIFEROL 4200-500 MG-UNIT PO CAPS: 1.0000 | ORAL_CAPSULE | Freq: Two times a day (BID) | ORAL | Status: DC

## 2020-02-03 MED ORDER — IODIXANOL 320 MG/ML IV SOLN: 10.0000 mL | Freq: Once | INTRAVENOUS | Status: DC | PRN

## 2020-02-03 NOTE — ED Provider Notes (Signed)
[facility] Emergency Department Provider Note  ____________________________________________   First MD Initiated Contact with Patient 02/03/20 0124     (approximate)  I have reviewed the triage vital signs and the nursing notes.   HISTORY  Chief Complaint Blood Infection  Level 5 caveat:  history/ROS limited by acute/critical illness  HPI Vincent Poole is a 85 y.o. male with extensive chronic medical history that includes extensive metastatic bladder cancer  under the care of Dr. [name] but no longer receiving chemotherapy or radiation treatment.  He has bilateral nephrostomies and was recently admitted and discharged for sepsis and had a dislodged left nephrostomy that had to be fixed while in the hospital.  He has DNR paperwork and has home hospice coming out twice a week.  His wife called EMS tonight because of acute onset altered mental status similar to the last time when he was admitted for sepsis; specifically he was talking to people that were not there, asking her questions about things that happened many years ago, and responding to visual stimuli that are not present in the room such as a dog he used to own.  She said that his right nephrostomy has been leaking urine and is no longer draining into the bag and she is concerned it has been displaced as well.  He has been moaning and saying that he hurts but he has chronic pain due to the cancer.  He is almost completely deaf as previously documented in the medical record as well and cannot directly answer questions.  He is verbal and says that he is in pain but cannot tell me what is hurting.        Past Medical History:  Diagnosis Date  . Anxiety   . Arthritis   . Bladder cancer ([place])   . Dysrhythmia 07/2018   history of atrial flutter that worsens with anxiety  . Femur fracture, right ([place]) 05/01/2018  . GERD (gastroesophageal reflux disease)   . History of recent blood transfusion 05/2018  .  Hypertension   . Iron deficiency anemia 09/14/2018  . Prostate cancer ([place]) 07/2018   cancer growing in prostate but not prostate cancer, it is from the bladder  . Umbilical hernia 32/3557  . Urinary retention 2019   foley catheter place 11/2017  . UTI (urinary tract infection) 2019   frequent UTI's over last year  . Wound eschar of foot 07/2018   left heal getting wrapped and requiring antibiotic cream. cracks open with weight bearing.    Patient Active Problem List   Diagnosis Date Noted  . Sepsis secondary to UTI ([place]) 01/21/2020  . Hyponatremia 01/21/2020  . Thrombocytosis ([place]) 01/21/2020  . Abdominal wall cellulitis 11/23/2019  . Intra-abdominal abscess ([place])   . Intra-abdominal infection 10/05/2019  . S/P partial resection of colon 07/17/2019  . Protein-calorie malnutrition, severe 06/11/2019  . Abdominal pain, generalized   . Palliative care by specialist   . Urothelial cancer ([place])   . Weakness   . Large bowel obstruction ([place]) 06/06/2019  . Hydronephrosis, right 04/08/2019  . Pressure injury of skin 03/23/2019  . SIRS (systemic inflammatory response syndrome) ([place]) 03/22/2019  . Leukocytosis 02/21/2019  . Attention to nephrostomy ([place]) 11/22/2018  . Complicated UTI (urinary tract infection) 11/14/2018  . Palliative care encounter   . Iron deficiency anemia secondary to inadequate dietary iron intake 06/27/2018  . Vitamin B12 deficiency 06/27/2018  . History of fracture of right hip 06/26/2018  . History of normocytic normochromic  anemia 06/26/2018  . Personal history of bladder cancer 06/26/2018  . Hip fracture ([place]) 05/01/2018  . Malignant neoplasm of urinary bladder ([place]) 01/12/2018  . Goals of care, counseling/discussion 01/12/2018  . History of recurrent UTIs 08/03/2017  . Pyelonephritis 05/31/2017  . UTI (urinary tract infection) 03/04/2017  . Urinary obstruction   . Essential hypertension 08/30/2016  . History of shingles 08/30/2016  . Prostate cancer  ([place]) 08/30/2016  . Urinary retention 08/30/2016  . Medicare annual wellness visit, initial 08/01/2016  . Medicare annual wellness visit, subsequent 08/01/2016  . Sepsis ([place]) 07/11/2016  . Borderline diabetes mellitus 01/26/2016  . Vaccine counseling 01/26/2015  . Lumbar radiculitis 06/24/2014  . OA (osteoarthritis) of hip 06/24/2014  . Malignant neoplasm of prostate ([place]) 01/27/2011    Past Surgical History:  Procedure Laterality Date  . CARPAL TUNNEL RELEASE Right   . CHOLECYSTECTOMY  2004  . COLOSTOMY N/A 06/08/2019   Procedure: COLOSTOMY;  Surgeon: Herbert Pun, MD;  Location: [facility];  Service: General;  Laterality: N/A;  . CYSTOGRAM  07/18/2018   Procedure: CYSTOGRAM;  Surgeon: [name], MD;  Location: [facility];  Service: Urology;;  . Consuela Mimes W/ RETROGRADES Bilateral 07/18/2018   Procedure: CYSTOSCOPY WITH RETROGRADE PYELOGRAM;  Surgeon: [name], MD;  Location: [facility];  Service: Urology;  Laterality: Bilateral;  . CYSTOSCOPY W/ URETERAL STENT PLACEMENT Bilateral 12/27/2017   Procedure: CYSTOSCOPY WITH RETROGRADE PYELOGRAM/URETERAL STENT PLACEMENT;  Surgeon: [name], MD;  Location: [facility];  Service: Urology;  Laterality: Bilateral;  . CYSTOSCOPY W/ URETERAL STENT PLACEMENT Bilateral 07/18/2018   Procedure: CYSTOSCOPY WITH STENT REPLACEMENT (exchange);  Surgeon: [name], MD;  Location: [facility];  Service: Urology;  Laterality: Bilateral;  . CYSTOSCOPY WITH STENT PLACEMENT Bilateral 11/12/2018   Procedure: [place] WITH STENT Exchange;  Surgeon: [name], MD;  Location: [facility];  Service: Urology;  Laterality: Bilateral;  . INTRAMEDULLARY (IM) NAIL INTERTROCHANTERIC Right 05/02/2018   Procedure: INTRAMEDULLARY (IM) NAIL INTERTROCHANTRIC;  Surgeon: [name], MD;  Location: [facility];  Service: Orthopedics;  Laterality: Right;  . IR NEPHROSTOMY EXCHANGE LEFT  03/25/2019  . IR NEPHROSTOMY EXCHANGE LEFT  04/19/2019  . IR NEPHROSTOMY  EXCHANGE LEFT  05/31/2019  . IR NEPHROSTOMY EXCHANGE LEFT  07/19/2019  . IR NEPHROSTOMY EXCHANGE LEFT  09/13/2019  . IR NEPHROSTOMY EXCHANGE LEFT  11/22/2019  . IR NEPHROSTOMY EXCHANGE LEFT  01/03/2020  . IR NEPHROSTOMY EXCHANGE LEFT  01/27/2020  . IR NEPHROSTOMY EXCHANGE RIGHT  03/25/2019  . IR NEPHROSTOMY EXCHANGE RIGHT  04/19/2019  . IR NEPHROSTOMY EXCHANGE RIGHT  05/31/2019  . IR NEPHROSTOMY EXCHANGE RIGHT  07/19/2019  . IR NEPHROSTOMY EXCHANGE RIGHT  09/13/2019  . IR NEPHROSTOMY EXCHANGE RIGHT  11/22/2019  . IR NEPHROSTOMY EXCHANGE RIGHT  01/03/2020  . IR NEPHROSTOMY PLACEMENT LEFT  02/23/2019  . IR NEPHROSTOMY PLACEMENT RIGHT  02/23/2019  . LAPAROTOMY N/A 06/08/2019   Procedure: EXPLORATORY LAPAROTOMY;  Surgeon: Herbert Pun, MD;  Location: [facility];  Service: General;  Laterality: N/A;  . LEG TENDON SURGERY Right 1958  . PORTA CATH INSERTION N/A 01/22/2018   Procedure: PORTA CATH INSERTION;  Surgeon: [name], MD;  Location: [facility];  Service: Cardiovascular;  Laterality: N/A;  . TRANSURETHRAL RESECTION OF BLADDER TUMOR N/A 12/27/2017   Procedure: TRANSURETHRAL RESECTION OF BLADDER TUMOR (TURBT);  Surgeon: [name], MD;  Location: [facility];  Service: Urology;  Laterality: N/A;  . TRANSURETHRAL RESECTION OF BLADDER TUMOR N/A 01/15/2018   Procedure: TRANSURETHRAL RESECTION OF  BLADDER TUMOR (TURBT);  Surgeon: [name], MD;  Location: [facility];  Service: Urology;  Laterality: N/A;  Need 2 hrs for this case please  . TRANSURETHRAL RESECTION OF BLADDER TUMOR N/A 07/18/2018   Procedure: TRANSURETHRAL RESECTION OF BLADDER TUMOR (TURBT);  Surgeon: [name], MD;  Location: [facility];  Service: Urology;  Laterality: N/A;    Prior to Admission medications   Medication Sig Start Date End Date Taking? Authorizing Provider  acetaminophen (TYLENOL) 500 MG tablet Take 1,000 mg by mouth every 6 (six) hours as needed for moderate pain or headache.    Yes [provider]  bisacodyl (DULCOLAX) 5 MG EC tablet Take 1 tablet (5 mg total) by mouth daily as needed for moderate constipation. 01/26/20  Yes Nolberto Hanlon, MD  cetirizine (ZYRTEC) 10 MG tablet Take 10 mg by mouth daily as needed for allergies.    Yes [provider]  collagenase (SANTYL) ointment Apply topically daily. Patient taking differently: Apply 1 application topically daily. To wounds 01/26/20  Yes Amery, Gwynneth Albright, MD  Cranberry-Cholecalciferol (SUPER CRANBERRY/VITAMIN D3) 4200-500 MG-UNIT CAPS Take 1 capsule by mouth 2 (two) times daily.    Yes [provider]  feeding supplement, ENSURE ENLIVE, (ENSURE ENLIVE) LIQD Take 237 mLs by mouth 2 (two) times daily between meals. Patient taking differently: Take 237 mLs by mouth 3 (three) times daily between meals.  06/11/19  Yes Bettey Costa, MD  ferrous sulfate 325 (65 FE) MG tablet Take 1 tablet (325 mg total) by mouth 2 (two) times daily with a meal. 05/06/18  Yes Gouru, Aruna, MD  lidocaine-prilocaine (EMLA) cream Apply 1 application topically as needed. 01/02/19  Yes Sindy Guadeloupe, MD  MONOJECT PREFILL ADVANCED NACL 0.9 % SOLN injection Inject 10 mLs into the vein daily. 01/06/20  Yes [provider]  Multiple Vitamin (MULTIVITAMIN WITH MINERALS) TABS tablet Take 1 tablet by mouth daily.   Yes [provider]  Oxycodone HCl 10 MG TABS Take 1 tablet (10 mg total) by mouth every 4 (four) hours as needed. 01/20/20 02/19/20 Yes Sindy Guadeloupe, MD  sulfamethoxazole-trimethoprim (BACTRIM DS) 800-160 MG tablet Take 1 tablet by mouth every 12 (twelve) hours for 9 days. 01/26/20 02/04/20 Yes Nolberto Hanlon, MD  vitamin B-12 (CYANOCOBALAMIN) 500 MCG tablet Take 500 mcg by mouth daily.   Yes [provider]  mirabegron ER (MYRBETRIQ) 50 MG TB24 tablet Take 1 tablet (50 mg total) by mouth daily. Patient not taking: Reported on 02/03/2020 12/03/19   Billey Co, MD  nystatin [facility]) powder Apply topically 2 (two) times  daily. Patient not taking: Reported on 12/02/2019 09/28/18   Zara Council A, PA-C  ondansetron (ZOFRAN) 8 MG tablet Take 1 tablet (8 mg total) by mouth 2 (two) times daily as needed for refractory nausea / vomiting. Start on day 3 after chemo. Patient not taking: Reported on 12/02/2019 08/01/19   Sindy Guadeloupe, MD  polyethylene glycol powder [facility]) powder Take 17 g by mouth daily as needed. Can increase to 3 times a day as needed for constipation but hold medication if has diarrhea Patient not taking: Reported on 02/03/2020 01/30/18   Sindy Guadeloupe, MD  prochlorperazine (COMPAZINE) 10 MG tablet Take 1 tablet (10 mg total) by mouth every 6 (six) hours as needed (Nausea or vomiting). Patient not taking: Reported on 11/11/2019 08/01/19   Sindy Guadeloupe, MD  traZODone (DESYREL) 50 MG tablet Take 0.5 tablets (25 mg total) by mouth at bedtime as needed for  sleep. Patient not taking: Reported on 12/02/2019 11/26/19   Nicole Kindred A, DO    Allergies Patient has no known allergies.  Family History  Problem Relation Age of Onset  . Cancer Mother   . Chronic Renal Failure Mother   . Heart disease Father     Social History Social History   Tobacco Use  . Smoking status: Never Smoker  . Smokeless tobacco: Never Used  Substance Use Topics  . Alcohol use: No    Alcohol/week: 0.0 standard drinks  . Drug use: No    Review of Systems Level 5 caveat:  history/ROS limited by acute/critical illness   ____________________________________________   PHYSICAL EXAM:  VITAL SIGNS: ED Triage Vitals  Enc Vitals Group     BP 02/03/20 0100 112/62     Pulse Rate 02/03/20 0100 (!) 110     Resp 02/03/20 0102 17     Temp 02/03/20 0102 99.6 F (37.6 C)     Temp Source 02/03/20 0102 Oral     SpO2 02/03/20 0100 (!) 88 %     Weight --      Height --      Head Circumference --      Peak Flow --      Pain Score --      Pain Loc --      Pain Edu? --      Excl. in [place]? --    Constitutional: Awake  and alert but very hard of hearing and unable to directly answer questions.  Appears critically and chronically ill. Eyes: Conjunctivae are pale. Head: Atraumatic. Nose: No congestion/rhinnorhea. Mouth/Throat: Mucous membranes are dry. Neck: No stridor.  No meningeal signs.   Cardiovascular: Tachycardia, regular rhythm. Good peripheral circulation. Grossly normal heart sounds. Respiratory: Normal respiratory effort.  No retractions. Gastrointestinal: Cachectic, soft and nondistended with mild diffuse tenderness throughout.  The patient has a chronically infected appearing ostomy. Musculoskeletal: Bilateral nephrostomy tubes, left tube is draining but the right tube is leaking extensive urine onto the bed and not draining into the bag. Neurologic:   Patient cannot participate in an extensive neurologic exam but he has no gross neurological deficits noted.  His speech is clear when he talks. Skin:  Skin is warm, pale, and dry.  Sacral decubitus ulcer down to the bone. Psychiatric: Mood and affect are depressed.  ____________________________________________   LABS (all labs ordered are listed, but only abnormal results are displayed)  Labs Reviewed  LACTIC ACID, PLASMA - Abnormal; Notable for the following components:      Result Value   Lactic Acid, Venous 2.9 (*)    All other components within normal limits  COMPREHENSIVE METABOLIC PANEL - Abnormal; Notable for the following components:   Sodium 131 (*)    Potassium 5.7 (*)    CO2 21 (*)    Creatinine, Ser 1.27 (*)    Albumin 2.4 (*)    Alkaline Phosphatase 168 (*)    GFR calc non Af Amer 52 (*)    All other components within normal limits  BRAIN NATRIURETIC PEPTIDE - Abnormal; Notable for the following components:   B Natriuretic Peptide 714.0 (*)    All other components within normal limits  CBC WITH DIFFERENTIAL/PLATELET - Abnormal; Notable for the following components:   WBC 70.5 (*)    RBC 3.50 (*)    Hemoglobin 10.5 (*)      HCT 32.4 (*)    RDW 17.7 (*)    Platelets 431 (*)  Neutro Abs 60.9 (*)    Lymphs Abs 4.4 (*)    Monocytes Absolute 2.3 (*)    Abs Immature Granulocytes 2.38 (*)    All other components within normal limits  URINALYSIS, COMPLETE (UACMP) WITH MICROSCOPIC - Abnormal; Notable for the following components:   Color, Urine YELLOW (*)    APPearance TURBID (*)    Hgb urine dipstick MODERATE (*)    Protein, ur 30 (*)    Leukocytes,Ua LARGE (*)    Bacteria, UA FEW (*)    All other components within normal limits  RESPIRATORY PANEL BY RT PCR (FLU A&B, COVID)  CULTURE, BLOOD (ROUTINE X 2)  CULTURE, BLOOD (ROUTINE X 2)  URINE CULTURE  LACTIC ACID, PLASMA  LIPASE, BLOOD  PROCALCITONIN  PROTIME-INR  COMPREHENSIVE METABOLIC PANEL  CBC   ____________________________________________  EKG  ED ECG REPORT I, Hinda Kehr, the attending physician, personally viewed and interpreted this ECG.  Date: 02/03/2020 EKG Time: 1:30 AM Rate: 113 Rhythm: Sinus tachycardia QRS Axis: normal Intervals: normal ST/T Wave abnormalities: Non-specific ST segment / T-wave changes, but no clear evidence of acute ischemia. Narrative Interpretation: no definitive evidence of acute ischemia; does not meet STEMI criteria.   ____________________________________________  RADIOLOGY I, Hinda Kehr, personally viewed and evaluated these images (plain radiographs) as part of my medical decision making, as well as reviewing the written report by the radiologist.  ED MD interpretation: Stable right pleural effusion with no acute changes.  Hydroureteronephrosis on the right that is new from prior with nephrostomy that appears to be in the right position.  Official radiology report(s): CT ABDOMEN PELVIS W CONTRAST  Result Date: 02/03/2020 CLINICAL DATA:  Bilateral nephrostomy tubes, sacral wound, on antibiotics for infection EXAM: CT ABDOMEN AND PELVIS WITH CONTRAST TECHNIQUE: Multidetector CT imaging of the  abdomen and pelvis was performed using the standard protocol following bolus administration of intravenous contrast. CONTRAST:  69mL OMNIPAQUE IOHEXOL 300 MG/ML  SOLN COMPARISON:  01/21/2020 FINDINGS: Lower chest: Moderate right and small left pleural effusions. Associated lower lobe atelectasis. Hepatobiliary: Liver is within normal limits. Status post cholecystectomy. No intrahepatic or extrahepatic ductal dilatation. Pancreas: Within normal limits. Spleen: Within normal limits. Adrenals/Urinary Tract: Mild nodular thickening of the bilateral adrenal glands. 2.8 cm right upper pole renal cyst. Mild left renal cortical scarring/atrophy. Right nephrostomy catheter in unchanged position from the prior, although advancement deeper into the collecting system is sometimes preferable. On the current study, there is moderate right hydroureteronephrosis, new from prior. Left nephrostomy catheter in satisfactory position, advanced/exchange when compared to the prior. Mild fullness of the left renal collecting system without hydronephrosis. Bladder is poorly visualized. Large necrotic prostate/bladder mass measuring approximately 8.7 x 13.4 x 10.4 cm (sagittal image 62), grossly unchanged from recent CT. Mass protrudes/bulges past the skin surface (series 3/image 65). Direct communication between the suspected anterior bladder and skin surface (series 3/image 70). Stomach/Bowel: Stomach is within normal limits. No evidence of bowel obstruction. Normal appendix (series 3/image 45). Transverse colostomy in the mid abdomen (series 3/image 30). Mild rectal stool burden. Vascular/Lymphatic: No evidence of abdominal aortic aneurysm. Atherosclerotic calcifications of the abdominal aorta and branch vessels. No suspicious abdominopelvic lymphadenopathy. Reproductive: Large necrotic prostate/bladder mass, described above. Other: No abdominopelvic ascites. Musculoskeletal: Degenerative changes of the visualized thoracolumbar spine.  Status post ORIF of the right hip. Degenerative changes of the bilateral hips. Mild cutaneous thickening overlying the left coccyx (series 3/image 81). No underlying soft tissue gas or definite osseous destruction. IMPRESSION: Moderate right hydroureteronephrosis, new  from prior. Indwelling right nephrostomy catheter is unchanged in position from the prior. Correlate with catheter output and consider catheter exchange or advance min deeper into the collecting system. Interval left nephrostomy catheter adjustment/exchange, now without hydronephrosis. Large necrotic prostate/bladder mass, as described above. Direct communication between the suspected bladder and skin surface in the midline lower pelvis. Mild soft tissue thickening overlying the coccyx. No underlying soft tissue gas or definite osseous destruction. Moderate right and small left pleural effusions, unchanged. Additional stable ancillary findings as above. Electronically Signed   By: Julian Hy M.D.   On: 02/03/2020 03:49   DG Chest Port 1 View  Result Date: 02/03/2020 CLINICAL DATA:  Sepsis EXAM: PORTABLE CHEST 1 VIEW COMPARISON:  01/21/2020 FINDINGS: Cardiac shadow is within normal limits. Aortic calcifications are again seen. Right-sided chest wall port is within normal limits. Left lung is well aerated and within normal limits. Right lung demonstrates a small pleural effusion stable in appearance from the prior exam. Mild basilar atelectasis is noted as well. No bony abnormality is seen. IMPRESSION: Stable right pleural effusion and underlying atelectatic changes. Electronically Signed   By: Inez Catalina M.D.   On: 02/03/2020 01:59    ____________________________________________   PROCEDURES   Procedure(s) performed (including Critical Care):  .Critical Care Performed by: Hinda Kehr, MD Authorized by: Hinda Kehr, MD   Critical care provider statement:    Critical care time (minutes):  30   Critical care time was  exclusive of:  Separately billable procedures and treating other patients   Critical care was necessary to treat or prevent imminent or life-threatening deterioration of the following conditions:  Sepsis   Critical care was time spent personally by me on the following activities:  Development of treatment plan with patient or surrogate, discussions with consultants, evaluation of patient's response to treatment, examination of patient, obtaining history from patient or surrogate, ordering and performing treatments and interventions, ordering and review of laboratory studies, ordering and review of radiographic studies, pulse oximetry, re-evaluation of patient's condition and review of old charts  .1-3 Lead EKG Interpretation Performed by: Hinda Kehr, MD Authorized by: Hinda Kehr, MD     Interpretation: abnormal     ECG rate:  110   ECG rate assessment: tachycardic     Rhythm: sinus tachycardia     Ectopy: none     Conduction: normal       ____________________________________________   INITIAL IMPRESSION / MDM / ASSESSMENT AND PLAN / ED COURSE  As part of my medical decision making, I reviewed the following data within the [place] History obtained from family, Nursing notes reviewed and incorporated, Labs reviewed , EKG interpreted , Old chart reviewed, Radiograph reviewed , Discussed with admitting physician  and reviewed Notes from prior ED visits   Differential diagnosis includes, but is not limited to, sepsis, displaced nephrostomy tube, worsening cancer, bacteremia.  I reviewed the medical record and saw that the patient had a leukocytosis of about 70 on his last visit and palliative care was consulted.  Home health is present but the patient's wife says that he took a turn for the worse over the last day and that he had been improving to the point where even told her yesterday that he thinks that he is getting better.  He has been on Bactrim as per infectious  disease recommendations.  He once again appears critically and acute on chronically ill.  I had bedside discussions with the patient's wife and explained that  this is going to keep happening to him and that he has little to no chance of long-term recovery or any significant functional improvement.  She says that she understands but she wants to proceed with everything that can be done to make him better short of intubation and chest compressions.  This being the case I have ordered sepsis protocol including 30 mL/kg of lactated Ringer's and empiric antibiotics for presumed infection of healthcare associated urinary tract infection.  Have also ordered a CT scan of the abdomen and pelvis to try and identify any issues with the right nephrostomy.  Anticipate admission for sepsis care as well as additional palliative care and social work consults.  Also probably will need urology and/or interventional radiology consults.      Clinical Course as of Feb 03 555  Mon Feb 03, 2020  0304 SARS Coronavirus 2 by RT PCR: NEGATIVE [CF]  [phone] Lab work notable for a persistent leukocytosis of about 70.  Urine is likely grossly infected but is difficult to appreciate how this compares to normal.  COVID-19 is negative.  Procalcitonin is elevated at 0.85 consistent with systemic infection.  Lactic acid is elevated at 2.9 again consistent with overall volume depletion but also probable sepsis.  No acute kidney injury at this time.  Potassium is elevated at 5.7 but I think it is worth rechecking after fluid resuscitation.  Given her reassuring EKG I do not want to treat the hyperkalemia too aggressively and instead may give him a dose of Lokelma.   [CF]  0354 Patient has acute hydroureter and nephrosis even though the right nephrostomy appears to be in approximately the same position as before.  I will page Dr. Bernardo Heater to discuss appropriate management but I suspect that it will be evaluated in the morning.  I will also  consult the hospitalist for admission.  CT ABDOMEN PELVIS W CONTRAST [CF]  0401 Discussed case with Dr. Bernardo Heater.  He recommended that the hospitalist consult interventional radiology in the morning for a nephrostogram and probable replacement of the tube.  He said nothing emergently needs to happen right now other than what I am already doing with fluid resuscitation and antibiotics.   [CF]  0405 Discussed case by phone with Dr. Sidney Ace who will admit.   [CF]    Clinical Course User Index [CF] Hinda Kehr, MD     ____________________________________________  FINAL CLINICAL IMPRESSION(S) / ED DIAGNOSES  Final diagnoses:  Sepsis, due to unspecified organism, unspecified whether acute organ dysfunction present [facility])  DNR (do not resuscitate)  Malignant neoplasm of urinary bladder, unspecified site ([place])  Nephrostomy tube displaced ([place])     MEDICATIONS GIVEN DURING THIS VISIT:  Medications  ceFEPIme (MAXIPIME) 2 g in sodium chloride 0.9 % 100 mL IVPB (0 g Intravenous Stopped 02/03/20 0332)  lactated ringers bolus 1,000 mL (0 mLs Intravenous Stopped 02/03/20 0332)    And  lactated ringers bolus 1,000 mL (1,000 mLs Intravenous New Bag/Given 02/03/20 0248)  iohexol (OMNIPAQUE) 300 MG/ML solution 100 mL (75 mLs Intravenous Contrast Given 02/03/20 0321)     ED Discharge Orders    None      *Please note:  Vincent Poole was evaluated in Emergency Department on 02/03/2020 for the symptoms described in the history of present illness. He was evaluated in the context of the global COVID-19 pandemic, which necessitated consideration that the patient might be at risk for infection with the SARS-CoV-2 virus that causes COVID-19. Institutional protocols and algorithms that pertain  to the evaluation of patients at risk for COVID-19 are in a state of rapid change based on information released by regulatory bodies including the CDC and federal and state organizations. These policies and algorithms  were followed during the patient's care in the ED.  Some ED evaluations and interventions may be delayed as a result of limited staffing during the pandemic.*  Note:  This document was prepared using Dragon voice recognition software and may include unintentional dictation errors.   Hinda Kehr, MD 02/03/20 [phone]

## 2020-02-03 NOTE — Progress Notes (Signed)
PHARMACY -  BRIEF ANTIBIOTIC NOTE   Pharmacy has received consult(s) for cefepime from an ED provider.  The patient's profile has been reviewed for ht/wt/allergies/indication/available labs.    One time order(s) placed for cefepime 2g IV x 1  Further antibiotics/pharmacy consults should be ordered by admitting physician if indicated.                       Thank you,  Tobie Lords, PharmD, BCPS Clinical Pharmacist 02/03/2020  3:49 AM

## 2020-02-03 NOTE — Consult Note (Signed)
Urology Consult  I have been asked to see the patient by Dr. Sidney Ace, for evaluation and management of nondraining right nephrostomy tube with right hydroureteronephrosis.  Chief Complaint: Nondraining right nephrostomy tube, right flank pain  History of Present Illness: Vincent Poole is a 85 y.o. year old male with stage IV bladder cancer and bilateral PCNs as well as colostomy, newly on home hospice, who presented to the ED overnight with reports of a 1 day history of nondraining right nephrostomy tube and altered mental status.  He was previously admitted from 01/21/2020 to 01/26/2020 with urosepsis and a leaking left nephrostomy tube.  Left PCN was exchanged during his inpatient stay on 01/25/2020 with recommendations to return to IR in 8 weeks for tube exchange.  He was discharged on Bactrim DS twice daily for management of Enterobacter cloacae UTI and had not yet completed this course at the time of his presentation this morning.  Admission labs notable for elevated lactate of 2.9; AKI with creatinine 1.27; leukocytosis of 70.5; and UA with 11-20 RBCs/hpf, 21-50 WBCs/hpf, few bacteria, and both budding yeast and hyphae.  Blood culture pending with no growth at <12 hours, urine culture pending.  CTAP revealed new onset right hydroureteronephrosis compared with prior imaging from 01/21/2020.  Right nephrostomy tube appears to remain in place on imaging.  On antibiotics as below.  Patient is hard of hearing and accompanied by his wife, who contributes to HPI.  She reports that she initially noticed nondraining right PCN yesterday morning.  She attempted to flush the nephrostomy tube at home and was able to push fluid into it, however she was not able to obtain any return.  She reports the patient has expressed right flank pain in the interim.  Additionally, patient's wife reports the transporting him to visits is complicated and requires EMS transport.  She wonders if his next nephrostomy tube  exchange can be completed during his inpatient stay.  He was scheduled for bilateral nephrostomy tube exchange on 02/14/2020 prior to his last hospitalization.  Anti-infectives (From admission, onward)   Start     Dose/Rate Route Frequency Ordered Stop   02/03/20 1100  ceFEPIme (MAXIPIME) 2 g in sodium chloride 0.9 % 100 mL IVPB     2 g 200 mL/hr over 30 Minutes Intravenous Every 12 hours 02/03/20 0956     02/03/20 0245  ceFEPIme (MAXIPIME) 2 g in sodium chloride 0.9 % 100 mL IVPB     2 g 200 mL/hr over 30 Minutes Intravenous  Once 02/03/20 0237 02/03/20 1601      Past Medical History:  Diagnosis Date  . Anxiety   . Arthritis   . Bladder cancer ([place])   . Dysrhythmia 07/2018   history of atrial flutter that worsens with anxiety  . Femur fracture, right ([place]) 05/01/2018  . GERD (gastroesophageal reflux disease)   . History of recent blood transfusion 05/2018  . Hypertension   . Iron deficiency anemia 09/14/2018  . Prostate cancer ([place]) 07/2018   cancer growing in prostate but not prostate cancer, it is from the bladder  . 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 Umbilical hernia 07/3234  . Urinary retention 2019   foley catheter place 11/2017  . UTI (urinary tract infection) 2019   frequent UTI's over last year  . Wound eschar of foot 07/2018   left heal getting wrapped and requiring antibiotic cream. cracks open with weight bearing.    Past Surgical History:  Procedure Laterality Date  . CARPAL TUNNEL RELEASE Right   . CHOLECYSTECTOMY  2004  . COLOSTOMY N/A 06/08/2019   Procedure: COLOSTOMY;  Surgeon: Herbert Pun, MD;  Location: [facility];  Service: General;  Laterality: N/A;  . CYSTOGRAM  07/18/2018   Procedure: CYSTOGRAM;  Surgeon: [name], MD;  Location: [facility];  Service: Urology;;  . Consuela Mimes W/ RETROGRADES Bilateral 07/18/2018   Procedure: CYSTOSCOPY WITH RETROGRADE PYELOGRAM;  Surgeon: [name], MD;  Location: [facility];  Service: Urology;   Laterality: Bilateral;  . CYSTOSCOPY W/ URETERAL STENT PLACEMENT Bilateral 12/27/2017   Procedure: CYSTOSCOPY WITH RETROGRADE PYELOGRAM/URETERAL STENT PLACEMENT;  Surgeon: [name], MD;  Location: [facility];  Service: Urology;  Laterality: Bilateral;  . CYSTOSCOPY W/ URETERAL STENT PLACEMENT Bilateral 07/18/2018   Procedure: CYSTOSCOPY WITH STENT REPLACEMENT (exchange);  Surgeon: [name], MD;  Location: [facility];  Service: Urology;  Laterality: Bilateral;  . CYSTOSCOPY WITH STENT PLACEMENT Bilateral 11/12/2018   Procedure: [place] WITH STENT Exchange;  Surgeon: [name], MD;  Location: [facility];  Service: Urology;  Laterality: Bilateral;  . INTRAMEDULLARY (IM) NAIL INTERTROCHANTERIC Right 05/02/2018   Procedure: INTRAMEDULLARY (IM) NAIL INTERTROCHANTRIC;  Surgeon: [name], MD;  Location: [facility];  Service: Orthopedics;  Laterality: Right;  . IR NEPHROSTOMY EXCHANGE LEFT  03/25/2019  . IR NEPHROSTOMY EXCHANGE LEFT  04/19/2019  . IR NEPHROSTOMY EXCHANGE LEFT  05/31/2019  . IR NEPHROSTOMY EXCHANGE LEFT  07/19/2019  . IR NEPHROSTOMY EXCHANGE LEFT  09/13/2019  . IR NEPHROSTOMY EXCHANGE LEFT  11/22/2019  . IR NEPHROSTOMY EXCHANGE LEFT  01/03/2020  . IR NEPHROSTOMY EXCHANGE LEFT  01/27/2020  . IR NEPHROSTOMY EXCHANGE RIGHT  03/25/2019  . IR NEPHROSTOMY EXCHANGE RIGHT  04/19/2019  . IR NEPHROSTOMY EXCHANGE RIGHT  05/31/2019  . IR NEPHROSTOMY EXCHANGE RIGHT  07/19/2019  . IR NEPHROSTOMY EXCHANGE RIGHT  09/13/2019  . IR NEPHROSTOMY EXCHANGE RIGHT  11/22/2019  . IR NEPHROSTOMY EXCHANGE RIGHT  01/03/2020  . IR NEPHROSTOMY PLACEMENT LEFT  02/23/2019  . IR NEPHROSTOMY PLACEMENT RIGHT  02/23/2019  . LAPAROTOMY N/A 06/08/2019   Procedure: EXPLORATORY LAPAROTOMY;  Surgeon: Herbert Pun, MD;  Location: [facility];  Service: General;  Laterality: N/A;  . LEG TENDON SURGERY Right 1958  . PORTA CATH INSERTION N/A 01/22/2018   Procedure: PORTA CATH INSERTION;  Surgeon: [name], MD;   Location: [facility];  Service: Cardiovascular;  Laterality: N/A;  . TRANSURETHRAL RESECTION OF BLADDER TUMOR N/A 12/27/2017   Procedure: TRANSURETHRAL RESECTION OF BLADDER TUMOR (TURBT);  Surgeon: [name], MD;  Location: [facility];  Service: Urology;  Laterality: N/A;  . TRANSURETHRAL RESECTION OF BLADDER TUMOR N/A 01/15/2018   Procedure: TRANSURETHRAL RESECTION OF BLADDER TUMOR (TURBT);  Surgeon: [name], MD;  Location: [facility];  Service: Urology;  Laterality: N/A;  Need 2 hrs for this case please  . TRANSURETHRAL RESECTION OF BLADDER TUMOR N/A 07/18/2018   Procedure: TRANSURETHRAL RESECTION OF BLADDER TUMOR (TURBT);  Surgeon: [name], MD;  Location: [facility];  Service: Urology;  Laterality: N/A;    Home Medications:  Current Meds  Medication Sig  . acetaminophen (TYLENOL) 500 MG tablet Take 1,000 mg by mouth every 6 (six) hours as needed for moderate pain or headache.   . bisacodyl (DULCOLAX) 5 MG EC tablet Take 1 tablet (5 mg total) by mouth daily as needed for moderate constipation.  . cetirizine (ZYRTEC) 10 MG tablet Take 10 mg by mouth daily as needed for allergies.   Marland Kitchen  collagenase (SANTYL) ointment Apply topically daily. (Patient taking differently: Apply 1 application topically daily. To wounds)  . Cranberry-Cholecalciferol (SUPER CRANBERRY/VITAMIN D3) 4200-500 MG-UNIT CAPS Take 1 capsule by mouth 2 (two) times daily.   . feeding supplement, ENSURE ENLIVE, (ENSURE ENLIVE) LIQD Take 237 mLs by mouth 2 (two) times daily between meals. (Patient taking differently: Take 237 mLs by mouth 3 (three) times daily between meals. )  . ferrous sulfate 325 (65 FE) MG tablet Take 1 tablet (325 mg total) by mouth 2 (two) times daily with a meal.  . lidocaine-prilocaine (EMLA) cream Apply 1 application topically as needed.  Marland Kitchen MONOJECT PREFILL ADVANCED NACL 0.9 % SOLN injection Inject 10 mLs into the vein daily.  . Multiple Vitamin (MULTIVITAMIN WITH MINERALS) TABS tablet  Take 1 tablet by mouth daily.  . Oxycodone HCl 10 MG TABS Take 1 tablet (10 mg total) by mouth every 4 (four) hours as needed.  . sulfamethoxazole-trimethoprim (BACTRIM DS) 800-160 MG tablet Take 1 tablet by mouth every 12 (twelve) hours for 9 days.  . vitamin B-12 (CYANOCOBALAMIN) 500 MCG tablet Take 500 mcg by mouth daily.    Allergies: No Known Allergies  Family History  Problem Relation Age of Onset  . Cancer Mother   . Chronic Renal Failure Mother   . Heart disease Father     Social History:  reports that he has never smoked. He has never used smokeless tobacco. He reports that he does not drink alcohol or use drugs.  ROS: A complete review of systems was performed.  All systems are negative except for pertinent findings as noted.  Physical Exam:  Vital signs in last 24 hours: Temp:  [99.6 F (37.6 C)] 99.6 F (37.6 C) (03/29 0102) Pulse Rate:  [88-111] 93 (03/29 1148) Resp:  [16-17] 16 (03/29 1148) BP: (105-117)/(54-68) 109/54 (03/29 1148) SpO2:  [88 %-99 %] 97 % (03/29 1148) Constitutional: Somnolent, disoriented, discomfort with movement HEENT: Hard of hearing Cardiovascular: No clubbing, cyanosis, or edema. Respiratory: Normal respiratory effort GI: Colostomy bag in place GU: Bilateral PCNs in place.  Left PCN draining clear, yellow urine.  Right PCN bag with scant yellow urine.  I have visualized the nephrostomy site and it does appear to be in place.  Urinary leakage noted around the right tube. Skin: No rashes, bruises or suspicious lesions  Laboratory Data:  Recent Labs    02/03/20 0110 02/03/20 0645  WBC 70.5* 56.2*  HGB 10.5* 8.5*  HCT 32.4* 25.9*   Recent Labs    02/03/20 0110 02/03/20 0645  NA 131* 129*  K 5.7* 4.5  CL 98 100  CO2 21* 24  GLUCOSE 82 85  BUN 20 19  CREATININE 1.27* 1.02  CALCIUM 9.5 9.0   Recent Labs    02/03/20 0110  INR 1.1   Urinalysis    Component Value Date/Time   COLORURINE YELLOW (A) 02/03/2020 0125    APPEARANCEUR TURBID (A) 02/03/2020 0125   APPEARANCEUR Cloudy (A) 10/23/2018 1327   LABSPEC 1.006 02/03/2020 0125   PHURINE 6.0 02/03/2020 0125   GLUCOSEU NEGATIVE 02/03/2020 0125   HGBUR MODERATE (A) 02/03/2020 0125   BILIRUBINUR NEGATIVE 02/03/2020 0125   BILIRUBINUR Negative 10/23/2018 1327   KETONESUR NEGATIVE 02/03/2020 0125   PROTEINUR 30 (A) 02/03/2020 0125   NITRITE NEGATIVE 02/03/2020 0125   LEUKOCYTESUR LARGE (A) 02/03/2020 0125   Results for orders placed or performed during the hospital encounter of 02/03/20  Blood Culture (routine x 2)     Status: None (  Preliminary result)   Collection Time: 02/03/20  1:10 AM   Specimen: BLOOD  Result Value Ref Range Status   Specimen Description BLOOD LEFT AC  Final   Special Requests   Final    BOTTLES DRAWN AEROBIC AND ANAEROBIC Blood Culture adequate volume   Culture   Final    NO GROWTH < 12 HOURS Performed at [facility], [address] [place], [place] [address]    Report Status PENDING  Incomplete  Respiratory Panel by RT PCR (Flu A&B, Covid) - Nasopharyngeal Swab     Status: None   Collection Time: 02/03/20  1:25 AM   Specimen: Nasopharyngeal Swab  Result Value Ref Range Status   SARS Coronavirus 2 by RT PCR NEGATIVE NEGATIVE Final    Comment: (NOTE) SARS-CoV-2 target nucleic acids are NOT DETECTED. The SARS-CoV-2 RNA is generally detectable in upper respiratoy specimens during the acute phase of infection. The lowest concentration of SARS-CoV-2 viral copies this assay can detect is 131 copies/mL. A negative result does not preclude SARS-Cov-2 infection and should not be used as the sole basis for treatment or other patient management decisions. A negative result may occur with  improper specimen collection/handling, submission of specimen other than nasopharyngeal swab, presence of viral mutation(s) within the areas targeted by this assay, and inadequate number of viral copies (<131 copies/mL). A negative  result must be combined with clinical observations, patient history, and epidemiological information. The expected result is Negative. Fact Sheet for Patients:  PinkCheek.be Fact Sheet for Healthcare Providers:  GravelBags.it This test is not yet ap proved or cleared by the [place] FDA and  has been authorized for detection and/or diagnosis of SARS-CoV-2 by FDA under an Emergency Use Authorization (EUA). This EUA will remain  in effect (meaning this test can be used) for the duration of the COVID-19 declaration under Section 564(b)(1) of the Act, 21 U.S.C. section 360bbb-3(b)(1), unless the authorization is terminated or revoked sooner.    Influenza A by PCR NEGATIVE NEGATIVE Final   Influenza B by PCR NEGATIVE NEGATIVE Final    Comment: (NOTE) The Xpert Xpress SARS-CoV-2/FLU/RSV assay is intended as an aid in  the diagnosis of influenza from Nasopharyngeal swab specimens and  should not be used as a sole basis for treatment. Nasal washings and  aspirates are unacceptable for Xpert Xpress SARS-CoV-2/FLU/RSV  testing. Fact Sheet for Patients: PinkCheek.be Fact Sheet for Healthcare Providers: GravelBags.it This test is not yet approved or cleared by the [place] FDA and  has been authorized for detection and/or diagnosis of SARS-CoV-2 by  FDA under an Emergency Use Authorization (EUA). This EUA will remain  in effect (meaning this test can be used) for the duration of the  Covid-19 declaration under Section 564(b)(1) of the Act, 21  U.S.C. section 360bbb-3(b)(1), unless the authorization is  terminated or revoked. Performed at [facility], [address] [place], [place] [address]   Blood Culture (routine x 2)     Status: None (Preliminary result)   Collection Time: 02/03/20  2:40 AM   Specimen: BLOOD RIGHT FOREARM  Result Value Ref Range  Status   Specimen Description   Final    BLOOD RIGHT FOREARM Performed at [facility], [place] [address], [place], [place] [address]    Special Requests   Final    BOTTLES DRAWN AEROBIC AND ANAEROBIC Blood Culture results may not be optimal due to an excessive volume of blood received in culture bottles   Culture  Setup Time PENDING  Incomplete   Culture   Final    NO GROWTH < 12 HOURS Performed at [facility], [address] [place], [place] [address]    Report Status PENDING  Incomplete    Radiologic Imaging: CT ABDOMEN PELVIS W CONTRAST  Result Date: 02/03/2020 CLINICAL DATA:  Bilateral nephrostomy tubes, sacral wound, on antibiotics for infection EXAM: CT ABDOMEN AND PELVIS WITH CONTRAST TECHNIQUE: Multidetector CT imaging of the abdomen and pelvis was performed using the standard protocol following bolus administration of intravenous contrast. CONTRAST:  62mL OMNIPAQUE IOHEXOL 300 MG/ML  SOLN COMPARISON:  01/21/2020 FINDINGS: Lower chest: Moderate right and small left pleural effusions. Associated lower lobe atelectasis. Hepatobiliary: Liver is within normal limits. Status post cholecystectomy. No intrahepatic or extrahepatic ductal dilatation. Pancreas: Within normal limits. Spleen: Within normal limits. Adrenals/Urinary Tract: Mild nodular thickening of the bilateral adrenal glands. 2.8 cm right upper pole renal cyst. Mild left renal cortical scarring/atrophy. Right nephrostomy catheter in unchanged position from the prior, although advancement deeper into the collecting system is sometimes preferable. On the current study, there is moderate right hydroureteronephrosis, new from prior. Left nephrostomy catheter in satisfactory position, advanced/exchange when compared to the prior. Mild fullness of the left renal collecting system without hydronephrosis. Bladder is poorly visualized. Large necrotic prostate/bladder mass measuring approximately 8.7 x 13.4 x 10.4 cm (sagittal  image 62), grossly unchanged from recent CT. Mass protrudes/bulges past the skin surface (series 3/image 65). Direct communication between the suspected anterior bladder and skin surface (series 3/image 70). Stomach/Bowel: Stomach is within normal limits. No evidence of bowel obstruction. Normal appendix (series 3/image 45). Transverse colostomy in the mid abdomen (series 3/image 30). Mild rectal stool burden. Vascular/Lymphatic: No evidence of abdominal aortic aneurysm. Atherosclerotic calcifications of the abdominal aorta and branch vessels. No suspicious abdominopelvic lymphadenopathy. Reproductive: Large necrotic prostate/bladder mass, described above. Other: No abdominopelvic ascites. Musculoskeletal: Degenerative changes of the visualized thoracolumbar spine. Status post ORIF of the right hip. Degenerative changes of the bilateral hips. Mild cutaneous thickening overlying the left coccyx (series 3/image 81). No underlying soft tissue gas or definite osseous destruction. IMPRESSION: Moderate right hydroureteronephrosis, new from prior. Indwelling right nephrostomy catheter is unchanged in position from the prior. Correlate with catheter output and consider catheter exchange or advance min deeper into the collecting system. Interval left nephrostomy catheter adjustment/exchange, now without hydronephrosis. Large necrotic prostate/bladder mass, as described above. Direct communication between the suspected bladder and skin surface in the midline lower pelvis. Mild soft tissue thickening overlying the coccyx. No underlying soft tissue gas or definite osseous destruction. Moderate right and small left pleural effusions, unchanged. Additional stable ancillary findings as above. Electronically Signed   By: Julian Hy M.D.   On: 02/03/2020 03:49   I personally reviewed the imaging above and note new onset right hydroureteronephrosis with a seemingly intact right nephrostomy tube.  Assessment & Plan:    85 year old male with advanced metastatic bladder cancer on hospice managed with colostomy as well as bilateral nephrostomy tubes, admitted with concern for nondraining right nephrostomy tube versus recurrent urosepsis despite outpatient antibiotics.  Patient's prognosis is quite poor; I suspect that his recurrent illnesses represent progression of his disease and may become more frequent moving forward.  I agree with plans for right nephrostogram with IR tomorrow for evaluation of nondraining right PCN.  As his left PCN was exchanged earlier this month, can likely defer scheduled PCN exchange on 02/14/2020 pending IR input.  Admission UA notable for yeast, unclear  if this represents skin contaminant versus yeast UTI.  May consider adding Diflucan versus ID consult for management of this.  No further urologic intervention necessary at this time.  Recommendations: -Agree with plans for right nephrostogram with IR tomorrow; can likely change next planned follow-up with them pending outcome -Consider Diflucan vs. ID consult for budding yeast and hyphae on UA.  Thank you for involving me in this patient's care, I will continue to follow along.  Debroah Loop, PA-C 02/03/2020 12:06 PM

## 2020-02-03 NOTE — Progress Notes (Signed)
Pharmacy Antibiotic Note  Vincent Poole is a 85 y.o. male admitted on 02/03/2020 with UTI.  Pharmacy has been consulted for cefepime dosing.  Plan: Cefepime 2 g IV q12h      Temp (24hrs), Avg:99.6 F (37.6 C), Min:99.6 F (37.6 C), Max:99.6 F (37.6 C)  Recent Labs  Lab 02/03/20 0110 02/03/20 0419 02/03/20 0645  WBC 70.5*  --  56.2*  CREATININE 1.27*  --  1.02  LATICACIDVEN 2.9* 1.6  --     Estimated Creatinine Clearance: 47.7 mL/min (by C-G formula based on SCr of 1.02 mg/dL).    No Known Allergies  Antimicrobials this admission: Cefepime 3/29>>  Dose adjustments this admission:   Microbiology results: BCx x2 NGTD UCx sent  Thank you for allowing pharmacy to be a part of this patient's care.  Rayna Sexton L 02/03/2020 11:30 AM

## 2020-02-03 NOTE — Progress Notes (Signed)
ED visit made.  Patient is currently followed by [facility] hospice services at home with a  Hospice diagnosis of malignant neoplasm of the bladder. This is a covered and related admission per hospice physician Dr.Betsy Walker. Patient is a DNR code with out of facility DNR in place in the home. He was brought to the [facility] ED via EMS overnight for evaluation of right flank pain and no urine out put from his right nephrostomy tube. He is being admitted with a diagnosis of UTI/AMS. Mrs. Hyson did contact hospice prior to transport. Urology and Palliative Medicine have been consulted Patient seen lying on the ED stretcher, wife [name] at bedside. [name] relayed the events that led to EMS being called. Urology PA Sam Villancourt in during visit. Plan is for a right nephrostogram. Writer and Mrs. Molner also discussed with Sam PA,  the possibility of changing out both nephrostomy tubes while patient is admitted as he is scheduled for change out on 4/9. She agreed to discuss with IR. Mrs. Lowery remains at bedside. Emotional support given. Patient did receive 2 mg IV morphine during the visit for management of pain. He is also receiving IV fluids and IV cefepime for treatment of UTI.  Will continue to follow and update hospice team. Thank you. Flo Shanks BSN, RN, [place] [phone]

## 2020-02-03 NOTE — Progress Notes (Signed)
CODE SEPSIS - PHARMACY COMMUNICATION  **Broad Spectrum Antibiotics should be administered within 1 hour of Sepsis diagnosis**  Time Code Sepsis Called/Page Received: 0238  Antibiotics Ordered: cefepime  Time of 1st antibiotic administration: 0250  Additional action taken by pharmacy:   If necessary, Name of Provider/Nurse Contacted:     Tobie Lords ,PharmD Clinical Pharmacist  02/03/2020  3:06 AM

## 2020-02-03 NOTE — ED Notes (Signed)
Urology at bedside to assess nephrostomy sites. Right side intake per PA with no drainage at this time. This RN removed 275 ml from left side nephrostomy.

## 2020-02-03 NOTE — Progress Notes (Signed)
[place] [facility] CARE NOTE Patient: Vincent Poole QMG:[id number]   PCP: [name], MD DOB: [birth year]   DOA: 02/03/2020   DOS: 02/03/2020    Patient was admitted by my colleague Dr. Sidney Ace earlier on 02/03/2020. I have reviewed the H&P as well as assessment and plan and agree with the same. Important changes in the plan are listed below.  Plan of care: Active Problems:   Essential hypertension   Prostate cancer ([place])   Urinary retention   Malignant neoplasm of urinary bladder (HCC)   Iron deficiency anemia secondary to inadequate dietary iron intake   Hydronephrosis, right   Hyponatremia   Altered mental status I had extensive discussion with patient's wife regarding her goals of care and patient's goals of care. Wife thought that patient's nephrostomy tube and cancer has no correlation and therefore was expecting that the tubes will continue to work without any problem for long-term.  She understands that the patient will have ongoing infection and will not recover from it. At present she wants to talk with her kids before making any change in the goals of care.  Author: Berle Mull, MD [facility] 02/03/2020 8:42 PM   If 7PM-7AM, please contact night-coverage at [email or web address]

## 2020-02-03 NOTE — H&P (Signed)
[facility] [place] NAME: Vincent Poole    MR#:  [id number]  DATE OF BIRTH:  14-Sep-1935  DATE OF ADMISSION:  02/03/2020  PRIMARY CARE PHYSICIAN: [name], MD   REQUESTING/REFERRING PHYSICIAN: Hinda Kehr, MD  CHIEF COMPLAINT:   Chief Complaint  Patient presents with  . Blood Infection    HISTORY OF PRESENT ILLNESS:  Vincent Poole  is a 85 y.o. 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  His right nephrostomy has not been draining and he has been having flank pain close to the site of nephrostomy.  His urine has been clear though.  He admitted to right lower quadrant abdominal pain.  He denied any nausea or vomiting or fever or chills.  No diarrhea.  He has a stage IV sacral decubitus ulcer.  No cough or wheezing or hemoptysis.  No COVID-19 exposure.  Upon presentation to the emergency room heart rate was in her 110 with a blood pressure of 104/65 and pulse confused 88% and later 95 on room air with temperature of 99.6.  Labs revealed 21-50 WBCs with 11-20 RBCs with large ketones and 30 protein and turbid urine..  CBC is remarkable for leukemoid reaction with WBC of 7.5 compared to 9 days ago in anemia better than previous levels.  BNP was 714 lactic acid 2.9 with procalcitonin 0.85.  Influenza antigens and COVID-19 PCR came back negative.  Blood and urine cultures were sent for which x-ray showed stable right pleural effusion with underlying atelectatic changes.  CT of the abdomen and pelvis with contrast revealed: Moderate right hydroureteronephrosis, new from prior. Indwelling right nephrostomy catheter is unchanged in position from the prior. Correlate with catheter output and consider  catheter exchange or advance min deeper into the collecting system.  Interval left nephrostomy catheter adjustment/exchange, now without hydronephrosis.  Large necrotic prostate/bladder mass, as described above. Direct communication between the suspected bladder and skin surface in the midline lower pelvis.  Mild soft tissue thickening overlying the coccyx. No underlying soft tissue gas or definite osseous destruction.  Moderate right and small left pleural effusions, unchanged.  Additional stable ancillary findings as above.  The patient was given IV cefepime and a bolus of 2 L of IV lactated Ringer and 4 mg IV morphine sulfate.  His wife declined comfort care and wanted to continue management for his acute condition.  The patient will be admitted to medical monitored bed for further evaluation and management.  PAST MEDICAL HISTORY:   Past Medical History:  Diagnosis Date  . Anxiety   . Arthritis   . Bladder cancer ([place])   . Dysrhythmia 07/2018   history of atrial flutter that worsens with anxiety  . Femur fracture, right ([place]) 05/01/2018  . GERD (gastroesophageal reflux disease)   . History of recent blood transfusion 05/2018  . Hypertension   . Iron deficiency anemia 09/14/2018  . Prostate cancer ([place]) 07/2018   cancer growing in prostate but not prostate cancer, it is from the bladder  . 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 Umbilical hernia 22/2979  . Urinary retention 2019   foley catheter place 11/2017  . UTI (urinary tract infection) 2019   frequent UTI's over last year  . Wound eschar of foot 07/2018  left heal getting wrapped and requiring antibiotic cream. cracks open with weight bearing.    PAST SURGICAL HISTORY:   Past Surgical History:  Procedure Laterality Date  . CARPAL TUNNEL RELEASE Right   . CHOLECYSTECTOMY  2004  . COLOSTOMY N/A 06/08/2019   Procedure: COLOSTOMY;  Surgeon: Herbert Pun, MD;  Location: [facility];  Service:  General;  Laterality: N/A;  . CYSTOGRAM  07/18/2018   Procedure: CYSTOGRAM;  Surgeon: [name], MD;  Location: [facility];  Service: Urology;;  . Consuela Mimes W/ RETROGRADES Bilateral 07/18/2018   Procedure: CYSTOSCOPY WITH RETROGRADE PYELOGRAM;  Surgeon: [name], MD;  Location: [facility];  Service: Urology;  Laterality: Bilateral;  . CYSTOSCOPY W/ URETERAL STENT PLACEMENT Bilateral 12/27/2017   Procedure: CYSTOSCOPY WITH RETROGRADE PYELOGRAM/URETERAL STENT PLACEMENT;  Surgeon: [name], MD;  Location: [facility];  Service: Urology;  Laterality: Bilateral;  . CYSTOSCOPY W/ URETERAL STENT PLACEMENT Bilateral 07/18/2018   Procedure: CYSTOSCOPY WITH STENT REPLACEMENT (exchange);  Surgeon: [name], MD;  Location: [facility];  Service: Urology;  Laterality: Bilateral;  . CYSTOSCOPY WITH STENT PLACEMENT Bilateral 11/12/2018   Procedure: [place] WITH STENT Exchange;  Surgeon: [name], MD;  Location: [facility];  Service: Urology;  Laterality: Bilateral;  . INTRAMEDULLARY (IM) NAIL INTERTROCHANTERIC Right 05/02/2018   Procedure: INTRAMEDULLARY (IM) NAIL INTERTROCHANTRIC;  Surgeon: [name], MD;  Location: [facility];  Service: Orthopedics;  Laterality: Right;  . IR NEPHROSTOMY EXCHANGE LEFT  03/25/2019  . IR NEPHROSTOMY EXCHANGE LEFT  04/19/2019  . IR NEPHROSTOMY EXCHANGE LEFT  05/31/2019  . IR NEPHROSTOMY EXCHANGE LEFT  07/19/2019  . IR NEPHROSTOMY EXCHANGE LEFT  09/13/2019  . IR NEPHROSTOMY EXCHANGE LEFT  11/22/2019  . IR NEPHROSTOMY EXCHANGE LEFT  01/03/2020  . IR NEPHROSTOMY EXCHANGE LEFT  01/27/2020  . IR NEPHROSTOMY EXCHANGE RIGHT  03/25/2019  . IR NEPHROSTOMY EXCHANGE RIGHT  04/19/2019  . IR NEPHROSTOMY EXCHANGE RIGHT  05/31/2019  . IR NEPHROSTOMY EXCHANGE RIGHT  07/19/2019  . IR NEPHROSTOMY EXCHANGE RIGHT  09/13/2019  . IR NEPHROSTOMY EXCHANGE RIGHT  11/22/2019  . IR NEPHROSTOMY EXCHANGE RIGHT  01/03/2020  . IR NEPHROSTOMY PLACEMENT LEFT  02/23/2019  . IR NEPHROSTOMY  PLACEMENT RIGHT  02/23/2019  . LAPAROTOMY N/A 06/08/2019   Procedure: EXPLORATORY LAPAROTOMY;  Surgeon: Herbert Pun, MD;  Location: [facility];  Service: General;  Laterality: N/A;  . LEG TENDON SURGERY Right 1958  . PORTA CATH INSERTION N/A 01/22/2018   Procedure: PORTA CATH INSERTION;  Surgeon: [name], MD;  Location: [facility];  Service: Cardiovascular;  Laterality: N/A;  . TRANSURETHRAL RESECTION OF BLADDER TUMOR N/A 12/27/2017   Procedure: TRANSURETHRAL RESECTION OF BLADDER TUMOR (TURBT);  Surgeon: [name], MD;  Location: [facility];  Service: Urology;  Laterality: N/A;  . TRANSURETHRAL RESECTION OF BLADDER TUMOR N/A 01/15/2018   Procedure: TRANSURETHRAL RESECTION OF BLADDER TUMOR (TURBT);  Surgeon: [name], MD;  Location: [facility];  Service: Urology;  Laterality: N/A;  Need 2 hrs for this case please  . TRANSURETHRAL RESECTION OF BLADDER TUMOR N/A 07/18/2018   Procedure: TRANSURETHRAL RESECTION OF BLADDER TUMOR (TURBT);  Surgeon: [name], MD;  Location: [facility];  Service: Urology;  Laterality: N/A;    SOCIAL HISTORY:   Social History   Tobacco Use  . Smoking status: Never Smoker  . Smokeless tobacco: Never Used  Substance Use Topics  . Alcohol use: No    Alcohol/week: 0.0 standard drinks    FAMILY HISTORY:   Family History  Problem  Relation Age of Onset  . Cancer Mother   . Chronic Renal Failure Mother   . Heart disease Father     DRUG ALLERGIES:  No Known Allergies  REVIEW OF SYSTEMS:   ROS As per history of present illness. All pertinent systems were reviewed above. Constitutional,  HEENT, cardiovascular, respiratory, GI, GU, musculoskeletal, neuro, psychiatric, endocrine,  integumentary and hematologic systems were reviewed and are otherwise  negative/unremarkable except for positive findings mentioned above in the HPI.   MEDICATIONS AT HOME:   Prior to Admission medications   Medication Sig Start Date End Date Taking?  Authorizing Provider  acetaminophen (TYLENOL) 500 MG tablet Take 1,000 mg by mouth every 6 (six) hours as needed for moderate pain or headache.    Yes [provider]  bisacodyl (DULCOLAX) 5 MG EC tablet Take 1 tablet (5 mg total) by mouth daily as needed for moderate constipation. 01/26/20  Yes Nolberto Hanlon, MD  cetirizine (ZYRTEC) 10 MG tablet Take 10 mg by mouth daily as needed for allergies.    Yes [provider]  collagenase (SANTYL) ointment Apply topically daily. Patient taking differently: Apply 1 application topically daily. To wounds 01/26/20  Yes Amery, Gwynneth Albright, MD  Cranberry-Cholecalciferol (SUPER CRANBERRY/VITAMIN D3) 4200-500 MG-UNIT CAPS Take 1 capsule by mouth 2 (two) times daily.    Yes [provider]  feeding supplement, ENSURE ENLIVE, (ENSURE ENLIVE) LIQD Take 237 mLs by mouth 2 (two) times daily between meals. Patient taking differently: Take 237 mLs by mouth 3 (three) times daily between meals.  06/11/19  Yes Bettey Costa, MD  ferrous sulfate 325 (65 FE) MG tablet Take 1 tablet (325 mg total) by mouth 2 (two) times daily with a meal. 05/06/18  Yes Gouru, Aruna, MD  lidocaine-prilocaine (EMLA) cream Apply 1 application topically as needed. 01/02/19  Yes Sindy Guadeloupe, MD  MONOJECT PREFILL ADVANCED NACL 0.9 % SOLN injection Inject 10 mLs into the vein daily. 01/06/20  Yes [provider]  Multiple Vitamin (MULTIVITAMIN WITH MINERALS) TABS tablet Take 1 tablet by mouth daily.   Yes [provider]  Oxycodone HCl 10 MG TABS Take 1 tablet (10 mg total) by mouth every 4 (four) hours as needed. 01/20/20 02/19/20 Yes Sindy Guadeloupe, MD  sulfamethoxazole-trimethoprim (BACTRIM DS) 800-160 MG tablet Take 1 tablet by mouth every 12 (twelve) hours for 9 days. 01/26/20 02/04/20 Yes Nolberto Hanlon, MD  vitamin B-12 (CYANOCOBALAMIN) 500 MCG tablet Take 500 mcg by mouth daily.   Yes [provider]  mirabegron ER (MYRBETRIQ) 50 MG TB24 tablet Take 1 tablet  (50 mg total) by mouth daily. Patient not taking: Reported on 02/03/2020 12/03/19   Billey Co, MD  nystatin [facility]) powder Apply topically 2 (two) times daily. Patient not taking: Reported on 12/02/2019 09/28/18   Zara Council A, PA-C  ondansetron (ZOFRAN) 8 MG tablet Take 1 tablet (8 mg total) by mouth 2 (two) times daily as needed for refractory nausea / vomiting. Start on day 3 after chemo. Patient not taking: Reported on 12/02/2019 08/01/19   Sindy Guadeloupe, MD  polyethylene glycol powder [facility]) powder Take 17 g by mouth daily as needed. Can increase to 3 times a day as needed for constipation but hold medication if has diarrhea Patient not taking: Reported on 02/03/2020 01/30/18   Sindy Guadeloupe, MD  prochlorperazine (COMPAZINE) 10 MG tablet Take 1 tablet (10 mg total) by mouth every 6 (six) hours as needed (Nausea or vomiting). Patient not taking: Reported on  11/11/2019 08/01/19   Sindy Guadeloupe, MD  traZODone (DESYREL) 50 MG tablet Take 0.5 tablets (25 mg total) by mouth at bedtime as needed for sleep. Patient not taking: Reported on 12/02/2019 11/26/19   Nicole Kindred A, DO      VITAL SIGNS:  Blood pressure 112/62, pulse (!) 108, temperature 99.6 F (37.6 C), temperature source Oral, resp. rate 17, SpO2 99 %.  PHYSICAL EXAMINATION:  Physical Exam  GENERAL:  85 y.o.-year-old Caucasian male patient lying in the bed with no acute distress.  EYES: Pupils equal, round, reactive to light and accommodation. No scleral icterus. Extraocular muscles intact.  HEENT: Head atraumatic, normocephalic. Oropharynx and nasopharynx clear.  NECK:  Supple, no jugular venous distention. No thyroid enlargement, no tenderness.  LUNGS: Normal breath sounds bilaterally, no wheezing, rales,rhonchi or crepitation. No use of accessory muscles of respiration.  CARDIOVASCULAR: Regular rate and rhythm, S1, S2 normal. No murmurs, rubs, or gallops.  ABDOMEN: Soft, nondistended, nontender. Bowel sounds  present. No organomegaly or mass.  He had right CVA tenderness around the site of his nephrostomy.  Both left and right nephrostomy tubes were intact.  He had intact ostomy bag over his bladder cancer as well as upper midline intact colostomy. EXTREMITIES: No pedal edema, cyanosis, or clubbing.  NEUROLOGIC: Cranial nerves II through XII are intact. Muscle strength 5/5 in all extremities. Sensation intact. Gait not checked.  PSYCHIATRIC: The patient is alert and oriented x 3.  Normal affect and good eye contact. SKIN: He has stage IV slightly tender sacral decubitus ulcer that was just dressed as well as gluteal ulcers. LABORATORY PANEL:   CBC Recent Labs  Lab 02/03/20 0110  WBC 70.5*  HGB 10.5*  HCT 32.4*  PLT 431*   ------------------------------------------------------------------------------------------------------------------  Chemistries  Recent Labs  Lab 02/03/20 0110  NA 131*  K 5.7*  CL 98  CO2 21*  GLUCOSE 82  BUN 20  CREATININE 1.27*  CALCIUM 9.5  AST 21  ALT 6  ALKPHOS 168*  BILITOT 1.1   ------------------------------------------------------------------------------------------------------------------  Cardiac Enzymes No results for input(s): TROPONINI in the last 168 hours. ------------------------------------------------------------------------------------------------------------------  RADIOLOGY:  CT ABDOMEN PELVIS W CONTRAST  Result Date: 02/03/2020 CLINICAL DATA:  Bilateral nephrostomy tubes, sacral wound, on antibiotics for infection EXAM: CT ABDOMEN AND PELVIS WITH CONTRAST TECHNIQUE: Multidetector CT imaging of the abdomen and pelvis was performed using the standard protocol following bolus administration of intravenous contrast. CONTRAST:  96mL OMNIPAQUE IOHEXOL 300 MG/ML  SOLN COMPARISON:  01/21/2020 FINDINGS: Lower chest: Moderate right and small left pleural effusions. Associated lower lobe atelectasis. Hepatobiliary: Liver is within normal limits.  Status post cholecystectomy. No intrahepatic or extrahepatic ductal dilatation. Pancreas: Within normal limits. Spleen: Within normal limits. Adrenals/Urinary Tract: Mild nodular thickening of the bilateral adrenal glands. 2.8 cm right upper pole renal cyst. Mild left renal cortical scarring/atrophy. Right nephrostomy catheter in unchanged position from the prior, although advancement deeper into the collecting system is sometimes preferable. On the current study, there is moderate right hydroureteronephrosis, new from prior. Left nephrostomy catheter in satisfactory position, advanced/exchange when compared to the prior. Mild fullness of the left renal collecting system without hydronephrosis. Bladder is poorly visualized. Large necrotic prostate/bladder mass measuring approximately 8.7 x 13.4 x 10.4 cm (sagittal image 62), grossly unchanged from recent CT. Mass protrudes/bulges past the skin surface (series 3/image 65). Direct communication between the suspected anterior bladder and skin surface (series 3/image 70). Stomach/Bowel: Stomach is within normal limits. No evidence of bowel obstruction. Normal appendix (series  3/image 45). Transverse colostomy in the mid abdomen (series 3/image 30). Mild rectal stool burden. Vascular/Lymphatic: No evidence of abdominal aortic aneurysm. Atherosclerotic calcifications of the abdominal aorta and branch vessels. No suspicious abdominopelvic lymphadenopathy. Reproductive: Large necrotic prostate/bladder mass, described above. Other: No abdominopelvic ascites. Musculoskeletal: Degenerative changes of the visualized thoracolumbar spine. Status post ORIF of the right hip. Degenerative changes of the bilateral hips. Mild cutaneous thickening overlying the left coccyx (series 3/image 81). No underlying soft tissue gas or definite osseous destruction. IMPRESSION: Moderate right hydroureteronephrosis, new from prior. Indwelling right nephrostomy catheter is unchanged in position  from the prior. Correlate with catheter output and consider catheter exchange or advance min deeper into the collecting system. Interval left nephrostomy catheter adjustment/exchange, now without hydronephrosis. Large necrotic prostate/bladder mass, as described above. Direct communication between the suspected bladder and skin surface in the midline lower pelvis. Mild soft tissue thickening overlying the coccyx. No underlying soft tissue gas or definite osseous destruction. Moderate right and small left pleural effusions, unchanged. Additional stable ancillary findings as above. Electronically Signed   By: Julian Hy M.D.   On: 02/03/2020 03:49   DG Chest Port 1 View  Result Date: 02/03/2020 CLINICAL DATA:  Sepsis EXAM: PORTABLE CHEST 1 VIEW COMPARISON:  01/21/2020 FINDINGS: Cardiac shadow is within normal limits. Aortic calcifications are again seen. Right-sided chest wall port is within normal limits. Left lung is well aerated and within normal limits. Right lung demonstrates a small pleural effusion stable in appearance from the prior exam. Mild basilar atelectasis is noted as well. No bony abnormality is seen. IMPRESSION: Stable right pleural effusion and underlying atelectatic changes. Electronically Signed   By: Inez Catalina M.D.   On: 02/03/2020 01:59      IMPRESSION AND PLAN:   1.  Obstructive uropathy with suspected malfunctioning right nephrostomy with possible leak.  He has right-sided hydroureteronephrosis with associated UTI as well as sepsis.   -The patient will be admitted to a medical monitored bed. -He will be hydrated with IV normal saline. -We will continue antibiotic therapy with IV cefepime and vancomycin for possibility of cellulitis and complicated UTI  -A right nephrostogram will be obtained by interventional radiology in a.m. for further assessment. -Urology consultation by Dr. Bernardo Heater will be obtained.  I notified him about the patient. -Pain management will be  provided.  2.  Extensive metastatic bladder cancer. -Palliative care consult to be obtained. -Pain management will be provided.  3.  Stage IV sacral decubitus ulcer.. -We will continue Santyl for now. -Wound care consult will be obtained.  4.  Vitamin B12 deficiency. -We will continue vitamin B12.  5.  Chronic anemia. -Continue ferrous sulfate.  6.  Overactive bladder. -We will continue Myrbetriq.  7.  DVT prophylaxis. -Subcutaneous Lovenox.  All the records are reviewed and case discussed with ED provider. The plan of care was discussed in details with the patient (and family). I answered all questions. The patient agreed to proceed with the above mentioned plan. Further management will depend upon hospital course.   CODE STATUS: Full code  TOTAL TIME TAKING CARE OF THIS PATIENT: 55 minutes.    Christel Mormon M.D on 02/03/2020 at 4:23 AM  [facility]   From 7 PM-7 AM, contact night-coverage [email or web address]  CC: Primary care physician; [name], MD   Note: This dictation was prepared with Dragon dictation along with smaller phrase technology. Any transcriptional errors that result from this process are unintentional.

## 2020-02-03 NOTE — ED Triage Notes (Signed)
Pt arrived via EMS from home where he lives with wife under hospice care. Pt has bilateral nephrostomy tubes that are leaking as well as ostomy bag that has foul ordor as well as green/yellow contents at stoma. Pt also has sacral wound with foul odor. Pt was seen in ED on 15th for infection, was given antibiotics. Pt back to ED today due to wife reporting that pt is not getting better with infections.

## 2020-02-03 NOTE — Plan of Care (Signed)
PMT note:  Patient is off the floor at this time. It appears goals are set for the evening and tomorrow, with plans in place for nephrostogram with IR tomorrow and possible treatment vs ID consult for infection. Team will re-attmept visit tomorrow.

## 2020-02-04 LAB — BASIC METABOLIC PANEL
Anion gap: 9 (ref 5–15)
BUN: 20 mg/dL (ref 8–23)
CO2: 20 mmol/L — ABNORMAL LOW (ref 22–32)
Calcium: 9 mg/dL (ref 8.9–10.3)
Chloride: 103 mmol/L (ref 98–111)
Creatinine, Ser: 0.99 mg/dL (ref 0.61–1.24)
GFR calc Af Amer: >60 mL/min (ref >60)
GFR calc non Af Amer: >60 mL/min (ref >60)
Glucose, Bld: 103 mg/dL — ABNORMAL HIGH (ref 70–99)
Potassium: 3.8 mmol/L (ref 3.5–5.1)
Sodium: 132 mmol/L — ABNORMAL LOW (ref 135–145)

## 2020-02-04 LAB — CBC
HCT: 27.6 % — ABNORMAL LOW (ref 39.0–52.0)
Hemoglobin: 8.7 g/dL — ABNORMAL LOW (ref 13.0–17.0)
MCH: 29.5 pg (ref 26.0–34.0)
MCHC: 31.5 g/dL (ref 30.0–36.0)
MCV: 93.6 fL (ref 80.0–100.0)
Platelets: 277 10*3/uL (ref 150–400)
RBC: 2.95 MIL/uL — ABNORMAL LOW (ref 4.22–5.81)
RDW: 17.4 % — ABNORMAL HIGH (ref 11.5–15.5)
WBC: 68 10*3/uL (ref 4.0–10.5)
nRBC: 0 % (ref 0.0–0.2)

## 2020-02-04 MED ORDER — LORAZEPAM 0.5 MG PO TABS: 0.5000 mg | ORAL_TABLET | ORAL | Status: DC | PRN

## 2020-02-04 MED ORDER — FLUCONAZOLE 100 MG PO TABS
100.0000 mg | ORAL_TABLET | Freq: Every day | ORAL | Status: DC
  Administered 2020-02-04 – 2020-02-05 (×2): 100 mg via ORAL
  Filled 2020-02-04 (×2): qty 1

## 2020-02-04 NOTE — Progress Notes (Signed)
[facility] Progress Note  Patient: Vincent Poole    OXB:[id number]  DOA: 02/03/2020     Date of Service: the patient was seen and examined on 02/04/2020  Chief Complaint  Patient presents with  . Blood Infection   Brief hospital course: Progressive fungating metastatic bladder cancer, bilateral nephrostomy tube, protein calorie malnutrition.  Patient presented with clogged right nephrostomy tube. Currently further plan is monitor recommendation from urology regarding the left nephrostomy tube.  Assessment and Plan: 1.  Nephrostomy tube malfunction SP replacement of the right nephrostomy tube. Now appears to have left nephrostomy tube leakage. I have notified urology on 02/04/2020 and we are waiting for further recommendation about the left nephrostomy tube. The left tube on the left appears to be functioning well and draining urine well. Potentially ready for discharge tomorrow as long as urology has no further input regarding the nephrostomy tube and no further complication.  2.  Metastatic fungating bladder cancer Poor prognosis. Not a candidate for any surgical correction. Currently on home with hospice.  3.  Goals of care discussion Spent prolonged time with family regarding prognosis of the condition. Explained to the patient will not survive regardless of how many times he gets antibiotics or IV fluids given that infection, cancer as well as nephrostomy tubes and there malfunction is all interconnected. Also explained the need for pain and symptom control. Explained the need for minimizing prolonging the suffering of the patient. Per wife patient was fairly independent and enjoys being with family and outdoor.  Explained that going forward we are anticipating frequent hospitalization with longer and complicated courses if their goals remain to be treat what is treatable. The need to focus on comfort and follow recommendation from hospice in case which patient declines in  future. Appreciate assistance with hospice will monitor.  4.  Profound leukocytosis Ongoing leukemoid reaction in response to patient's ongoing cancer. Monitor. Patient was given IV antibiotics we will discontinue the biotics given that leukocytosis continues to fluctuate despite antibiotics.  5.  Stage IV sacral ulcer. Present on admission. Continue wound care. Remains at poor prognosis in future.  6.  Chronic pain Continue pain control. Recommend more aggressive pain control in patient with fungating mass and sacral ulcer.  7.  Severe protein calorie malnutrition. In the setting of cancer. Body mass index is 17.76 kg/m.    Diet: Regular diet DVT Prophylaxis: Subcutaneous Heparin    Advance goals of care discussion: DNR  Family Communication: family was present at bedside, at the time of interview.  The pt provided permission to discuss medical plan with the family. Opportunity was given to ask question and all questions were answered satisfactorily.   Disposition:  Pt is from home, admitted with clogged nephrostomy tube, still has concerns with left nephrostomy tube, which precludes a safe discharge. Discharge to home, when work-up is completed.  Subjective: Difficult to have any interaction with the patient given his significant hard of hearing.  Denies any acute complaint.  No acute events overnight.  RN reported left nephrostomy tube leakage  Physical Exam: General:  alert Appear in mild distress, affect appropriate Eyes: PERRL ENT: Oral Mucosa Clear, moist  Neck: no JVD,  Cardiovascular: S1 and S2 Present, no Murmur,  Respiratory: good respiratory effort, Bilateral Air entry equal and Decreased, no Crackles, no wheezes Abdomen: Bowel Sound present, Soft and moderate tenderness,  Skin: no rash Extremities: no Pedal edema, no calf tenderness Neurologic: without any new focal findings Gait not checked due to patient  safety concerns  Vitals:   02/04/20 0047  02/04/20 0115 02/04/20 0807 02/04/20 1524  BP: (!) 85/48 (!) 104/51 (!) 156/127 (!) 102/48  Pulse: 90 88 98 98  Resp: 17  19 19   Temp: 98 F (36.7 C)  98.1 F (36.7 C) 98.1 F (36.7 C)  TempSrc:    Oral  SpO2: 100%  97% 99%  Weight:      Height:        Intake/Output Summary (Last 24 hours) at 02/04/2020 1922 Last data filed at 02/04/2020 1000 Gross per 24 hour  Intake 1438.57 ml  Output 575 ml  Net 863.57 ml   Filed Weights   02/03/20 1634  Weight: 59.4 kg    Data Reviewed: I have personally reviewed and interpreted daily labs, tele strips, imagings as discussed above. I reviewed all nursing notes, pharmacy notes, vitals, pertinent old records I have discussed plan of care as described above with RN and patient/family.  CBC: Recent Labs  Lab 02/03/20 0110 02/03/20 0645 02/04/20 1056  WBC 70.5* 56.2* 68.0*  NEUTROABS 60.9*  --   --   HGB 10.5* 8.5* 8.7*  HCT 32.4* 25.9* 27.6*  MCV 92.6 91.5 93.6  PLT 431* 322 881   Basic Metabolic Panel: Recent Labs  Lab 02/03/20 0110 02/03/20 0645 02/04/20 1056  NA 131* 129* 132*  K 5.7* 4.5 3.8  CL 98 100 103  CO2 21* 24 20*  GLUCOSE 82 85 103*  BUN 20 19 20   CREATININE 1.27* 1.02 0.99  CALCIUM 9.5 9.0 9.0    Studies: No results found.  Scheduled Meds: . Chlorhexidine Gluconate Cloth  6 each Topical Daily  . collagenase  1 application Topical Daily  . enoxaparin (LOVENOX) injection  40 mg Subcutaneous Q24H  . feeding supplement (ENSURE ENLIVE)  237 mL Oral TID BM  . fluconazole  100 mg Oral Daily  . sodium chloride flush  10 mL Intravenous Daily   Continuous Infusions: . sodium chloride 100 mL/hr at 02/04/20 1747   PRN Meds: acetaminophen **OR** acetaminophen, bisacodyl, iodixanol, iopamidol, lidocaine-prilocaine, LORazepam, magnesium hydroxide, morphine injection, ondansetron **OR** ondansetron (ZOFRAN) IV, oxyCODONE, oxyCODONE, traZODone  Time spent: 35 minutes  Author: Berle Mull, MD [facility] 02/04/2020 7:22 PM  To reach On-call, see care teams to locate the attending and reach out to them via [email or web address]. If 7PM-7AM, please contact night-coverage If you still have difficulty reaching the attending provider, please page the [facility] (Director on Call) for [facility] on amion for assistance.

## 2020-02-04 NOTE — Plan of Care (Signed)
PMT NOTE:  Per attending MD Dr. Posey Pronto, will D/C palliative consult.

## 2020-02-04 NOTE — Progress Notes (Addendum)
Visit made. Patient seen lying in bed, greeted writer with a wave and a hello. Wife [name] at bedside and daughter Freda Munro came in during visit. Patient able to interact verbally with his daughter and appeared to very much enjoy her company. Per chart note review patient continues on IV antibiotics for treatment of a UTI.  Right nephrostomy tube was exchanged yesterday by IR as it was completely blocked. Left nephrostomy tube was noted as patent and flushed with out issue.  Patient ate 50% of breakfast and did take oral medication this morning. He has received one prn dose of oxycodone 5 mg and one PRN dose of 2 mg IV morphine for pain since midnight.  [name] remains hopeful that the left nephrostomy tube can be exchanged during this hospital stay as it is scheduled for exchange on 4/9. Urology and attending physician aware. Emotional support provided. Will continue to follow and update hospice team. Transfer summary and current hospice medication list in place in shadow chart. Flo Shanks BSN, RN, [place] [phone]

## 2020-02-05 DIAGNOSIS — L89154 Pressure ulcer of sacral region, stage 4: Secondary | ICD-10-CM

## 2020-02-05 DIAGNOSIS — G894 Chronic pain syndrome: Secondary | ICD-10-CM

## 2020-02-05 DIAGNOSIS — N1330 Unspecified hydronephrosis: Secondary | ICD-10-CM

## 2020-02-05 DIAGNOSIS — D72829 Elevated white blood cell count, unspecified: Secondary | ICD-10-CM

## 2020-02-05 DIAGNOSIS — C679 Malignant neoplasm of bladder, unspecified: Secondary | ICD-10-CM

## 2020-02-05 MED ORDER — FLUCONAZOLE 100 MG PO TABS: 100.0000 mg | ORAL_TABLET | Freq: Every day | ORAL | 0 refills | Status: AC

## 2020-02-05 NOTE — Progress Notes (Signed)
Visit made. Patient seen lying in bed, eyes closed, no signs of discomfort noted. He did receive a PRN dose of oxycodone 10 mg at 6:45 this morning. Patient's wife Vincent Poole at bedside, discussed discharge plans for today. She is in agreement. Continued emotional support provided. Hospice team updated to discharge. New prescription for Diflucan has transmitted to the pharmacy, Vincent Poole aware. Staff RN Cristie Hem to provide discharge instructions. Patient resting with eyes closed at end of visit. Thank you. Flo Shanks BSN, RN, [place] [phone]

## 2020-02-05 NOTE — Discharge Summary (Signed)
KYMERE FULLINGTON SNK:[id number] DOB: June 23, 1935 DOA: 02/03/2020  PCP: [name], MD  Admit date: 02/03/2020 Discharge date: 02/05/2020  Admitted From: home Disposition:  home  Recommendations for Outpatient Follow-up:  1. Follow up with PCP in 1 week 2. Please obtain BMP/CBC in one week 3. If has any issues with the left nephrostomy tube can follow-up with urology  Home Health: Hospice at home already set up   Discharge Condition:Stable CODE STATUS: DNR Diet recommendationRegular  Brief/Interim Summary: Kelechi Orgeron  is a 85 y.o. 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  His right nephrostomy has not been draining and he has been having flank pain close to the site of nephrostomy.  His urine has been clear though.  Upon presentation to the emergency room heart rate was in her 110 with a blood pressure of 104/65 and pulse confused 88% and later 95 on room air with temperature of 99.6. It was suspected patient is having malfunction of the right nephrostomy with possible leak.  A.  He has right-sided hydroureteronephrosis with associtaed UTI. Was started on IV cefepime and vanco. Interventional radiology was consulted and so was urology.He is S/P replacement of the right nephrostomy tube.His left nephrostomy tube was also leaking intermittently.  I spoke to urology PA at this a.m. and she says is expected to leak intermittently.  Overnight he had no leakage from the left nephrostomy tube per nursing.  His left nephrostomy tube now appears to be functioning well and draining urine well.  His IV antibiotics were discontinued.  He was started on Diflucan by urology for budding yeast and hyphae a on urinalysis.  Urine culture revealed  multiple species suggested we collection.  Blood cultures so far negative.  I spoke to the wife she is willing to take him home.  Discharge Diagnoses:  Active Problems:   Essential hypertension   Prostate cancer ([place])   Urinary retention   Malignant neoplasm of urinary bladder (HCC)   Iron deficiency anemia secondary to inadequate dietary iron intake   Hydronephrosis, right   Hyponatremia   Altered mental status    Discharge Instructions  Discharge Instructions    Call MD for:  temperature >100.4   Complete by: As directed    Diet - low sodium heart healthy   Complete by: As directed    Discharge instructions   Complete by: As directed    Hospice will follow you at home. If any issues arises with nephrostomy tube please contact Urology   Increase activity slowly   Complete by: As directed      Allergies as of 02/05/2020   No Known Allergies     Medication List    STOP taking these medications   sulfamethoxazole-trimethoprim 800-160 MG tablet Commonly known as: BACTRIM DS     TAKE these medications   acetaminophen 500 MG tablet Commonly known as: TYLENOL Take 1,000 mg by mouth every 6 (six) hours as needed for moderate pain or headache.   bisacodyl 5 MG EC tablet Commonly known as: DULCOLAX Take 1 tablet (5 mg total) by mouth daily as needed for moderate constipation.   cetirizine 10 MG tablet Commonly known as: ZYRTEC Take 10 mg by mouth daily as needed for allergies.   collagenase ointment Commonly known as: SANTYL Apply topically daily.  What changed:   how much to take  additional instructions   feeding supplement (ENSURE ENLIVE) Liqd Take 237 mLs by mouth 2 (two) times daily between meals. What changed: when to take this   ferrous sulfate 325 (65 FE) MG tablet Take 1 tablet (325 mg total) by mouth 2 (two) times daily with a meal.   fluconazole 100 MG tablet Commonly known as: DIFLUCAN Take 1 tablet (100 mg total) by mouth daily. Start taking  on: February 06, 2020   lidocaine-prilocaine cream Commonly known as: EMLA Apply 1 application topically as needed.   mirabegron ER 50 MG Tb24 tablet Commonly known as: Myrbetriq Take 1 tablet (50 mg total) by mouth daily.   MONOJECT PREFILL ADVANCED NACL 0.9 % Soln injection Generic drug: sodium chloride flush Inject 10 mLs into the vein daily.   multivitamin with minerals Tabs tablet Take 1 tablet by mouth daily.   nystatin powder Commonly known as: Nyamyc Apply topically 2 (two) times daily.   ondansetron 8 MG tablet Commonly known as: Zofran Take 1 tablet (8 mg total) by mouth 2 (two) times daily as needed for refractory nausea / vomiting. Start on day 3 after chemo.   Oxycodone HCl 10 MG Tabs Take 1 tablet (10 mg total) by mouth every 4 (four) hours as needed.   polyethylene glycol powder 17 GM/SCOOP powder Commonly known as: MiraLax Take 17 g by mouth daily as needed. Can increase to 3 times a day as needed for constipation but hold medication if has diarrhea   prochlorperazine 10 MG tablet Commonly known as: COMPAZINE Take 1 tablet (10 mg total) by mouth every 6 (six) hours as needed (Nausea or vomiting).   Super Cranberry/Vitamin D3 4200-500 MG-UNIT Caps Generic drug: Cranberry-Cholecalciferol Take 1 capsule by mouth 2 (two) times daily.   traZODone 50 MG tablet Commonly known as: DESYREL Take 0.5 tablets (25 mg total) by mouth at bedtime as needed for sleep.   vitamin B-12 500 MCG tablet Commonly known as: CYANOCOBALAMIN Take 500 mcg by mouth daily.       No Known Allergies  Consultations:  IR and urology   Procedures/Studies: CT ABDOMEN PELVIS W CONTRAST  Result Date: 02/03/2020 CLINICAL DATA:  Bilateral nephrostomy tubes, sacral wound, on antibiotics for infection EXAM: CT ABDOMEN AND PELVIS WITH CONTRAST TECHNIQUE: Multidetector CT imaging of the abdomen and pelvis was performed using the standard protocol following bolus administration of  intravenous contrast. CONTRAST:  107mL OMNIPAQUE IOHEXOL 300 MG/ML  SOLN COMPARISON:  01/21/2020 FINDINGS: Lower chest: Moderate right and small left pleural effusions. Associated lower lobe atelectasis. Hepatobiliary: Liver is within normal limits. Status post cholecystectomy. No intrahepatic or extrahepatic ductal dilatation. Pancreas: Within normal limits. Spleen: Within normal limits. Adrenals/Urinary Tract: Mild nodular thickening of the bilateral adrenal glands. 2.8 cm right upper pole renal cyst. Mild left renal cortical scarring/atrophy. Right nephrostomy catheter in unchanged position from the prior, although advancement deeper into the collecting system is sometimes preferable. On the current study, there is moderate right hydroureteronephrosis, new from prior. Left nephrostomy catheter in satisfactory position, advanced/exchange when compared to the prior. Mild fullness of the left renal collecting system without hydronephrosis. Bladder is poorly visualized. Large necrotic prostate/bladder mass measuring approximately 8.7 x 13.4 x 10.4 cm (sagittal image 62), grossly unchanged from recent CT. Mass protrudes/bulges past the skin surface (series 3/image 65). Direct communication between the suspected anterior bladder and skin surface (series 3/image 70). Stomach/Bowel: Stomach is within normal limits. No evidence of bowel obstruction. Normal  appendix (series 3/image 45). Transverse colostomy in the mid abdomen (series 3/image 30). Mild rectal stool burden. Vascular/Lymphatic: No evidence of abdominal aortic aneurysm. Atherosclerotic calcifications of the abdominal aorta and branch vessels. No suspicious abdominopelvic lymphadenopathy. Reproductive: Large necrotic prostate/bladder mass, described above. Other: No abdominopelvic ascites. Musculoskeletal: Degenerative changes of the visualized thoracolumbar spine. Status post ORIF of the right hip. Degenerative changes of the bilateral hips. Mild cutaneous  thickening overlying the left coccyx (series 3/image 81). No underlying soft tissue gas or definite osseous destruction. IMPRESSION: Moderate right hydroureteronephrosis, new from prior. Indwelling right nephrostomy catheter is unchanged in position from the prior. Correlate with catheter output and consider catheter exchange or advance min deeper into the collecting system. Interval left nephrostomy catheter adjustment/exchange, now without hydronephrosis. Large necrotic prostate/bladder mass, as described above. Direct communication between the suspected bladder and skin surface in the midline lower pelvis. Mild soft tissue thickening overlying the coccyx. No underlying soft tissue gas or definite osseous destruction. Moderate right and small left pleural effusions, unchanged. Additional stable ancillary findings as above. Electronically Signed   By: Julian Hy M.D.   On: 02/03/2020 03:49   CT ABDOMEN PELVIS W CONTRAST  Result Date: 01/21/2020 CLINICAL DATA:  history of bladder and prostate cancer. EXAM: CT ABDOMEN AND PELVIS WITH CONTRAST TECHNIQUE: Multidetector CT imaging of the abdomen and pelvis was performed using the standard protocol following bolus administration of intravenous contrast. CONTRAST:  163mL OMNIPAQUE IOHEXOL 300 MG/ML  SOLN COMPARISON:  11/23/2019 FINDINGS: Lower chest: Small bilateral pleural effusions are again noted and appears slightly increased in volume from previous exam. Progressive masslike architectural distortion overlying the right lower lobe is favored to represent rounded atelectasis. Hepatobiliary: Cholecystectomy. No focal liver abnormality. No biliary ductal dilatation. Pancreas: Unremarkable. No pancreatic ductal dilatation or surrounding inflammatory changes. Spleen: Spleen measures 11.4 cm in length. No focal splenic abnormality Adrenals/Urinary Tract: Normal appearance of the adrenal glands. Bilateral percutaneous nephrostomy tubes are in place. No right-sided  hydronephrosis. Similar appearance of mild left hydronephrosis. Unchanged appearance of right kidney cyst. Large complex necrotic mass arising from the region of the bladder and prostate is again noted. This measures 13.9 x 9.8 by 10.3 cm (volume = 730 cm^3). Previously (by my measurements) this measures 12.2 x 9.2 by 11.3 cm (volume = 660 cm^3). Stomach/Bowel: Stomach is within normal limits. Appendix appears normal. No evidence of bowel wall thickening, distention, or inflammatory changes. Transverse colostomy is identified along the midline of abdomen. Vascular/Lymphatic: Aortic atherosclerosis. No aneurysm. No abdominopelvic adenopathy. Reproductive: The large pelvic mass is directly contiguous and inseparable from the prostate gland. Other: No significant free fluid or fluid collections. Musculoskeletal: Previous ORIF of right proximal femur. No acute or suspicious bone abnormality. IMPRESSION: 1. Interval increase in volume of large complex necrotic tumor arising from the region of the bladder and prostate gland. 2. Bilateral percutaneous nephrostomy tubes are in place. Similar appearance of mild left hydronephrosis. 3. Small bilateral pleural effusions are slightly increased in volume from previous exam. 4. Progressive masslike architectural distortion overlying the right lower lobe is favored to represent rounded atelectasis. Attention on follow-up imaging advised. Aortic Atherosclerosis (ICD10-I70.0). Electronically Signed   By: Kerby Moors M.D.   On: 01/21/2020 14:33   DG Chest Port 1 View  Result Date: 02/03/2020 CLINICAL DATA:  Sepsis EXAM: PORTABLE CHEST 1 VIEW COMPARISON:  01/21/2020 FINDINGS: Cardiac shadow is within normal limits. Aortic calcifications are again seen. Right-sided chest wall port is within normal limits. Left lung is well  aerated and within normal limits. Right lung demonstrates a small pleural effusion stable in appearance from the prior exam. Mild basilar atelectasis is noted  as well. No bony abnormality is seen. IMPRESSION: Stable right pleural effusion and underlying atelectatic changes. Electronically Signed   By: Inez Catalina M.D.   On: 02/03/2020 01:59   DG Chest Port 1 View  Result Date: 01/21/2020 CLINICAL DATA:  Altered mental status.  Oral temperature 100.9 EXAM: PORTABLE CHEST 1 VIEW COMPARISON:  Moderate-sized right pleural effusion which may be FINDINGS: Unchanged position of a right chest infusion port catheter. Heart size within normal limits. Aortic atherosclerosis. New from prior examination, there is a moderate-sized right pleural effusion which may be partially loculated. Associated right basilar opacity. Diffuse chronic prominence of the interstitial lung markings. The left lung is otherwise clear. No evidence of pneumothorax. No acute bony abnormality. Thoracic spondylosis. IMPRESSION: New moderate-sized right pleural effusion which may be partially loculated. Right basilar atelectasis. Pneumonia at the right lung base cannot be excluded. Aortic atherosclerosis. Electronically Signed   By: Kellie Simmering DO   On: 01/21/2020 13:24   DG NEPHROSTOGRAM RIGHT THRU EXISTING ACCESS  Result Date: 02/03/2020 INDICATION: 85 year old with bladder cancer and chronic bilateral nephrostomy tubes. Recent CT demonstrates right hydronephrosis. EXAM: LEFT NEPHROSTOGRAM THROUGH EXISTING CATHETER MEDICATIONS: None ANESTHESIA/SEDATION: None CONTRAST:  10 mL Omnipaque 300-administered into the collecting system(s) FLUOROSCOPY TIME:  Fluoroscopy Time: 24 seconds, 5 mGy COMPLICATIONS: None immediate. PROCEDURE: Patient was placed prone on the fluoroscopic table. The left nephrostomy tube was injected with contrast. Catheter was flushed with saline and attached to the gravity bag. Unable to flush the right nephrostomy tube at all. FINDINGS: Left nephrostomy tube is well positioned within the left renal pelvis. Mild dilatation left renal pelvis and left ureter. Right nephrostomy tube is  completely occluded. IMPRESSION: 1. Right nephrostomy tube is completely occluded. Patient was transferred to the interventional radiology suite for catheter exchange. 2. Left nephrostomy tube is well positioned and patent. Electronically Signed   By: Markus Daft M.D.   On: 02/03/2020 16:07   IR NEPHROSTOMY EXCHANGE LEFT  Result Date: 01/28/2020 INDICATION: 85 year old male with bladder cancer and bilateral indwelling percutaneous nephrostomy tubes. His left tube output is poor. On recent CT imaging, the tube had pulled back into the renal cortex. He presents for tube exchange. EXAM: Nephrostomy tube exchange COMPARISON:  None. MEDICATIONS: None. ANESTHESIA/SEDATION: None. CONTRAST:  10 mL Isovue-300-administered into the collecting system(s) FLUOROSCOPY TIME:  0 minutes 24 seconds 6.16 mGy COMPLICATIONS: None immediate. PROCEDURE: Informed written consent was obtained from the patient after a thorough discussion of the procedural risks, benefits and alternatives. All questions were addressed. Maximal Sterile Barrier Technique was utilized including caps, mask, sterile gowns, sterile gloves, sterile drape, hand hygiene and skin antiseptic. A timeout was performed prior to the initiation of the procedure. A gentle hand injection of contrast material was performed through the existing tube. The tube was located just within the lower pole calyx. The tube was transected and removed over a Bentson wire. The Bentson wire was manipulated back into the renal pelvis. A new Cook 10.2 [place] all-purpose drainage catheter was then advanced over the wire and formed in the renal pelvis. An image was obtained and stored for the medical record. The catheter was secured to the skin with 0 Prolene suture and connected to gravity bag drainage. IMPRESSION: Successful exchange of 10 French indwelling left-sided percutaneous nephrostomy tube. Electronically Signed   By: Dellis Filbert.D.  On: 01/28/2020 09:35   IR NEPHROSTOMY  EXCHANGE RIGHT  Result Date: 02/03/2020 INDICATION: 85 year old with bladder cancer and occluded right nephrostomy tube. EXAM: RIGHT NEPHROSTOMY TUBE EXCHANGE WITH FLUOROSCOPY Physician: Stephan Minister. Anselm Pancoast, MD COMPARISON:  None. MEDICATIONS: None ANESTHESIA/SEDATION: None CONTRAST:  10 mL-administered into the collecting system(s) FLUOROSCOPY TIME:  Fluoroscopy Time: 18 seconds, 7.79 mGy COMPLICATIONS: None immediate. PROCEDURE: Informed consent was obtained from the patient's wife. The patient was placed prone. The right nephrostomy tube was prepped and draped in sterile fashion. Maximal barrier sterile technique was utilized including caps, mask, sterile gowns, sterile gloves, sterile drape, hand hygiene and skin antiseptic. Catheter was cut and removed over a superstiff Amplatz wire. New 12 French multipurpose drain was advanced over the wire and reconstituted in the renal pelvis. Contrast injection confirmed placement in the renal pelvis. Skin was anesthetized with 1% lidocaine and sutured to the skin. Catheter was attached to a gravity bag. Fluoroscopic images were taken and saved for this procedure. FINDINGS: New nephrostomy tube within the right renal pelvis. Right hydronephrosis. IMPRESSION: Successful exchange of the right nephrostomy tube with fluoroscopy. Electronically Signed   By: Markus Daft M.D.   On: 02/03/2020 16:17       Subjective:   Discharge Exam: Vitals:   02/05/20 0033 02/05/20 0341  BP: (!) 98/49 (!) 103/47  Pulse: 85 81  Resp:    Temp: 97.8 F (36.6 C)   SpO2: 98% 98%   Vitals:   02/04/20 0807 02/04/20 1524 02/05/20 0033 02/05/20 0341  BP: (!) 156/127 (!) 102/48 (!) 98/49 (!) 103/47  Pulse: 98 98 85 81  Resp: 19 19    Temp: 98.1 F (36.7 C) 98.1 F (36.7 C) 97.8 F (36.6 C)   TempSrc:  Oral Oral   SpO2: 97% 99% 98% 98%  Weight:      Height:        General: Pt has eyes closed, wife at bedside.NAD Cardiovascular: RRR, S1/S2 +, no rubs, no gallops Respiratory: CTA  bilaterally, no wheezing, no rhonchi Abdominal: Soft, NT, ND, bowel sounds +b/l nephrostomy tubes with urine. Ostomy bag in palce Extremities: no edema, no cyanosis    The results of significant diagnostics from this hospitalization (including imaging, microbiology, ancillary and laboratory) are listed below for reference.     Microbiology: Recent Results (from the past 240 hour(s))  Blood Culture (routine x 2)     Status: None (Preliminary result)   Collection Time: 02/03/20  1:10 AM   Specimen: BLOOD  Result Value Ref Range Status   Specimen Description BLOOD LEFT AC  Final   Special Requests   Final    BOTTLES DRAWN AEROBIC AND ANAEROBIC Blood Culture adequate volume   Culture   Final    NO GROWTH 2 DAYS Performed at [facility], [address] [place], [place] [address]    Report Status PENDING  Incomplete  Urine culture     Status: Abnormal   Collection Time: 02/03/20  1:25 AM   Specimen: In/Out Cath Urine  Result Value Ref Range Status   Specimen Description   Final    IN/OUT CATH URINE Performed at [facility], [address] [place], [place] [address]    Special Requests   Final    NONE Performed at [facility], [address] [place], [place] [address]    Culture MULTIPLE SPECIES PRESENT, SUGGEST RECOLLECTION (A)  Final   Report Status 02/03/2020 FINAL  Final  Respiratory Panel by RT PCR (Flu  A&B, Covid) - Nasopharyngeal Swab     Status: None   Collection Time: 02/03/20  1:25 AM   Specimen: Nasopharyngeal Swab  Result Value Ref Range Status   SARS Coronavirus 2 by RT PCR NEGATIVE NEGATIVE Final    Comment: (NOTE) SARS-CoV-2 target nucleic acids are NOT DETECTED. The SARS-CoV-2 RNA is generally detectable in upper respiratoy specimens during the acute phase of infection. The lowest concentration of SARS-CoV-2 viral copies this assay can detect is 131 copies/mL. A negative result does not preclude SARS-Cov-2 infection and  should not be used as the sole basis for treatment or other patient management decisions. A negative result may occur with  improper specimen collection/handling, submission of specimen other than nasopharyngeal swab, presence of viral mutation(s) within the areas targeted by this assay, and inadequate number of viral copies (<131 copies/mL). A negative result must be combined with clinical observations, patient history, and epidemiological information. The expected result is Negative. Fact Sheet for Patients:  PinkCheek.be Fact Sheet for Healthcare Providers:  GravelBags.it This test is not yet ap proved or cleared by the [place] FDA and  has been authorized for detection and/or diagnosis of SARS-CoV-2 by FDA under an Emergency Use Authorization (EUA). This EUA will remain  in effect (meaning this test can be used) for the duration of the COVID-19 declaration under Section 564(b)(1) of the Act, 21 U.S.C. section 360bbb-3(b)(1), unless the authorization is terminated or revoked sooner.    Influenza A by PCR NEGATIVE NEGATIVE Final   Influenza B by PCR NEGATIVE NEGATIVE Final    Comment: (NOTE) The Xpert Xpress SARS-CoV-2/FLU/RSV assay is intended as an aid in  the diagnosis of influenza from Nasopharyngeal swab specimens and  should not be used as a sole basis for treatment. Nasal washings and  aspirates are unacceptable for Xpert Xpress SARS-CoV-2/FLU/RSV  testing. Fact Sheet for Patients: PinkCheek.be Fact Sheet for Healthcare Providers: GravelBags.it This test is not yet approved or cleared by the [place] FDA and  has been authorized for detection and/or diagnosis of SARS-CoV-2 by  FDA under an Emergency Use Authorization (EUA). This EUA will remain  in effect (meaning this test can be used) for the duration of the  Covid-19 declaration under Section  564(b)(1) of the Act, 21  U.S.C. section 360bbb-3(b)(1), unless the authorization is  terminated or revoked. Performed at [facility], [address] [place], [place] [address]   Blood Culture (routine x 2)     Status: None (Preliminary result)   Collection Time: 02/03/20  2:40 AM   Specimen: BLOOD RIGHT FOREARM  Result Value Ref Range Status   Specimen Description   Final    BLOOD RIGHT FOREARM Performed at [facility], [place] [address], [place], [place] [address]    Special Requests   Final    BOTTLES DRAWN AEROBIC AND ANAEROBIC Blood Culture results may not be optimal due to an excessive volume of blood received in culture bottles   Culture   Final    NO GROWTH 2 DAYS Performed at [facility], [address] [place], [place] [address]    Report Status PENDING  Incomplete     Labs: BNP (last 3 results) Recent Labs    02/03/20 0110  BNP 798.9*   Basic Metabolic Panel: Recent Labs  Lab 02/03/20 0110 02/03/20 0645 02/04/20 1056  NA 131* 129* 132*  K 5.7* 4.5 3.8  CL 98 100 103  CO2 21* 24 20*  GLUCOSE 82 85 103*  BUN 20 19 20   CREATININE 1.27* 1.02 0.99  CALCIUM 9.5 9.0 9.0   Liver Function Tests: Recent Labs  Lab 02/03/20 0110 02/03/20 0645  AST 21 8*  ALT 6 6  ALKPHOS 168* 125  BILITOT 1.1 0.3  PROT 6.6 5.3*  ALBUMIN 2.4* 2.0*   Recent Labs  Lab 02/03/20 0110  LIPASE 17   No results for input(s): AMMONIA in the last 168 hours. CBC: Recent Labs  Lab 02/03/20 0110 02/03/20 0645 02/04/20 1056  WBC 70.5* 56.2* 68.0*  NEUTROABS 60.9*  --   --   HGB 10.5* 8.5* 8.7*  HCT 32.4* 25.9* 27.6*  MCV 92.6 91.5 93.6  PLT 431* 322 277   Cardiac Enzymes: No results for input(s): CKTOTAL, CKMB, CKMBINDEX, TROPONINI in the last 168 hours. BNP: Invalid input(s): POCBNP CBG: No results for input(s): GLUCAP in the last 168 hours. D-Dimer No results for input(s): DDIMER in the last 72 hours. Hgb A1c No results for input(s):  HGBA1C in the last 72 hours. Lipid Profile No results for input(s): CHOL, HDL, LDLCALC, TRIG, CHOLHDL, LDLDIRECT in the last 72 hours. Thyroid function studies No results for input(s): TSH, T4TOTAL, T3FREE, THYROIDAB in the last 72 hours.  Invalid input(s): FREET3 Anemia work up No results for input(s): VITAMINB12, FOLATE, FERRITIN, TIBC, IRON, RETICCTPCT in the last 72 hours. Urinalysis    Component Value Date/Time   COLORURINE YELLOW (A) 02/03/2020 0125   APPEARANCEUR TURBID (A) 02/03/2020 0125   APPEARANCEUR Cloudy (A) 10/23/2018 1327   LABSPEC 1.006 02/03/2020 0125   PHURINE 6.0 02/03/2020 0125   GLUCOSEU NEGATIVE 02/03/2020 0125   HGBUR MODERATE (A) 02/03/2020 0125   BILIRUBINUR NEGATIVE 02/03/2020 0125   BILIRUBINUR Negative 10/23/2018 1327   KETONESUR NEGATIVE 02/03/2020 0125   PROTEINUR 30 (A) 02/03/2020 0125   NITRITE NEGATIVE 02/03/2020 0125   LEUKOCYTESUR LARGE (A) 02/03/2020 0125   Sepsis Labs Invalid input(s): PROCALCITONIN,  WBC,  LACTICIDVEN Microbiology Recent Results (from the past 240 hour(s))  Blood Culture (routine x 2)     Status: None (Preliminary result)   Collection Time: 02/03/20  1:10 AM   Specimen: BLOOD  Result Value Ref Range Status   Specimen Description BLOOD LEFT AC  Final   Special Requests   Final    BOTTLES DRAWN AEROBIC AND ANAEROBIC Blood Culture adequate volume   Culture   Final    NO GROWTH 2 DAYS Performed at [facility], [address], [place], [place] [address]    Report Status PENDING  Incomplete  Urine culture     Status: Abnormal   Collection Time: 02/03/20  1:25 AM   Specimen: In/Out Cath Urine  Result Value Ref Range Status   Specimen Description   Final    IN/OUT CATH URINE Performed at [facility], [address], [place], [place] [address]    Special Requests   Final    NONE Performed at [facility], [address] [place], [place] [address]    Culture MULTIPLE SPECIES PRESENT,  SUGGEST RECOLLECTION (A)  Final   Report Status 02/03/2020 FINAL  Final  Respiratory Panel by RT PCR (Flu A&B, Covid) - Nasopharyngeal Swab     Status: None   Collection Time: 02/03/20  1:25 AM   Specimen: Nasopharyngeal Swab  Result Value Ref Range Status   SARS Coronavirus 2 by RT PCR NEGATIVE NEGATIVE Final    Comment: (NOTE) SARS-CoV-2 target nucleic acids are NOT DETECTED. The SARS-CoV-2 RNA is generally detectable in upper  respiratoy specimens during the acute phase of infection. The lowest concentration of SARS-CoV-2 viral copies this assay can detect is 131 copies/mL. A negative result does not preclude SARS-Cov-2 infection and should not be used as the sole basis for treatment or other patient management decisions. A negative result may occur with  improper specimen collection/handling, submission of specimen other than nasopharyngeal swab, presence of viral mutation(s) within the areas targeted by this assay, and inadequate number of viral copies (<131 copies/mL). A negative result must be combined with clinical observations, patient history, and epidemiological information. The expected result is Negative. Fact Sheet for Patients:  PinkCheek.be Fact Sheet for Healthcare Providers:  GravelBags.it This test is not yet ap proved or cleared by the [place] FDA and  has been authorized for detection and/or diagnosis of SARS-CoV-2 by FDA under an Emergency Use Authorization (EUA). This EUA will remain  in effect (meaning this test can be used) for the duration of the COVID-19 declaration under Section 564(b)(1) of the Act, 21 U.S.C. section 360bbb-3(b)(1), unless the authorization is terminated or revoked sooner.    Influenza A by PCR NEGATIVE NEGATIVE Final   Influenza B by PCR NEGATIVE NEGATIVE Final    Comment: (NOTE) The Xpert Xpress SARS-CoV-2/FLU/RSV assay is intended as an aid in  the diagnosis of influenza  from Nasopharyngeal swab specimens and  should not be used as a sole basis for treatment. Nasal washings and  aspirates are unacceptable for Xpert Xpress SARS-CoV-2/FLU/RSV  testing. Fact Sheet for Patients: PinkCheek.be Fact Sheet for Healthcare Providers: GravelBags.it This test is not yet approved or cleared by the [place] FDA and  has been authorized for detection and/or diagnosis of SARS-CoV-2 by  FDA under an Emergency Use Authorization (EUA). This EUA will remain  in effect (meaning this test can be used) for the duration of the  Covid-19 declaration under Section 564(b)(1) of the Act, 21  U.S.C. section 360bbb-3(b)(1), unless the authorization is  terminated or revoked. Performed at [facility], [address] [place], [place] [address]   Blood Culture (routine x 2)     Status: None (Preliminary result)   Collection Time: 02/03/20  2:40 AM   Specimen: BLOOD RIGHT FOREARM  Result Value Ref Range Status   Specimen Description   Final    BLOOD RIGHT FOREARM Performed at [facility], [place] [address], [place], [place] [address]    Special Requests   Final    BOTTLES DRAWN AEROBIC AND ANAEROBIC Blood Culture results may not be optimal due to an excessive volume of blood received in culture bottles   Culture   Final    NO GROWTH 2 DAYS Performed at [facility], [address] [place], [place] [address]    Report Status PENDING  Incomplete   1.  Nephrostomy tube malfunction SP replacement of the right nephrostomy tube. Lt nephros. No leaks overnight.  Wife this am told me she does not want the lt to be changed anyways as pt didn't want it.  2.  Metastatic fungating bladder cancer Poor prognosis. Not a candidate for any surgical correction. Currently on home with hospice.  3.  Profound leukocytosis Ongoing leukemoid reaction in response to patient's ongoing  cancer. Monitor. Patient was given IV antibiotics but was discontinue the antibiotics given that leukocytosis continues to fluctuate despite antibiotics.  5.  Stage IV sacral ulcer. Present on admission. Continue wound care. Remains at poor prognosis in future.  6.  Chronic pain Continue pain  control. Recommend more aggressive pain control in patient with fungating mass and sacral ulcer.  7.  Severe protein calorie malnutrition. In the setting of cancer. Body mass index is 17.76 kg/m.    Time coordinating discharge: Over 30 minutes  SIGNED:   Nolberto Hanlon, MD  [facility] 02/05/2020, 12:40 PM Pager   If 7PM-7AM, please contact night-coverage [email or web address] Password [email or web address]

## 2020-02-05 NOTE — TOC Transition Note (Signed)
Transition of Care [facility]) - CM/SW Discharge Note   Patient Details  Name: Vincent Poole MRN: [id number] Date of Birth: [birth year]  [facility]) CM/SW Contact:  Elease Hashimoto, LCSW Phone Number: 02/05/2020, 11:51 AM   Clinical Narrative:   Pt medically ready to return home with [facility] services. He will go home via EMS. Bedside RN aware and EMS contacted regarding discharge today. Packet in chart    Final next level of care: Home w Hospice Care Barriers to Discharge: Barriers Resolved   Patient Goals and CMS Choice        Discharge Placement                Patient to be transferred to facility by: EMS Name of family member notified: [name] wife Patient and family notified of of transfer: 02/05/20  Discharge Plan and Services                            [place]: [facility] Date [place]: 02/05/20   Representative spoke with at [place]: [place]) Interventions     Readmission Risk Interventions Readmission Risk Prevention Plan 11/26/2019 10/29/2019 10/10/2019  Transportation Screening Complete Complete Complete  PCP or Specialist Appt within 3-5 Days - - -  [place] or [place] for [place] - - -  Medication Review [employer]) - Complete Complete  PCP or Specialist appointment within 3-5 days of discharge Complete Complete -  [place] or [place] - Complete Patient refused  SW Recovery Care/Counseling Consult Complete - Complete  Palliative Care Screening Complete Patient Refused -  [place] Not Applicable Not Applicable -  Some recent data might be hidden

## 2020-02-05 NOTE — TOC Progression Note (Signed)
Transition of Care [facility]) - Progression Note    Patient Details  Name: Vincent Poole MRN: [id number] Date of Birth: April 20, 1935  [facility]) CM/SW Contact  Maryann Mccall, [place] Rhyme, LCSW Phone Number: 02/05/2020, 10:27 AM  Clinical Narrative:   Messaged from bedside RN pt will be discharged today have contacted [facility] to inform of this will work on EMS transport home.          Expected Discharge Plan and Services                                                 Social Determinants of Health (SDOH) Interventions    Readmission Risk Interventions Readmission Risk Prevention Plan 11/26/2019 10/29/2019 10/10/2019  Transportation Screening Complete Complete Complete  PCP or Specialist Appt within 3-5 Days - - -  [place] or [place] for [place] - - -  Medication Review Press photographer) - Complete Complete  PCP or Specialist appointment within 3-5 days of discharge Complete Complete -  [place] or [place] - Complete Patient refused  SW Recovery Care/Counseling Consult Complete - Complete  Palliative Care Screening Complete Patient Refused -  [place] Not Applicable Not Applicable -  Some recent data might be hidden

## 2020-02-08 LAB — CULTURE, BLOOD (ROUTINE X 2)
Culture: NO GROWTH
Culture: NO GROWTH
Special Requests: ADEQUATE

## 2020-02-14 ENCOUNTER — Ambulatory Visit: Payer: Medicare Other

## 2020-02-19 ENCOUNTER — Other Ambulatory Visit: Payer: Self-pay | Admitting: Radiology

## 2020-02-19 DIAGNOSIS — N1339 Other hydronephrosis: Secondary | ICD-10-CM

## 2020-02-20 ENCOUNTER — Other Ambulatory Visit: Payer: Self-pay | Admitting: Radiology

## 2020-02-20 ENCOUNTER — Telehealth: Payer: Self-pay | Admitting: Radiology

## 2020-02-20 DIAGNOSIS — N390 Urinary tract infection, site not specified: Secondary | ICD-10-CM

## 2020-02-20 DIAGNOSIS — N1339 Other hydronephrosis: Secondary | ICD-10-CM

## 2020-02-20 MED ORDER — CIPROFLOXACIN HCL 500 MG PO TABS: 500.0000 mg | ORAL_TABLET | Freq: Two times a day (BID) | ORAL | 0 refills | Status: AC

## 2020-02-20 NOTE — Telephone Encounter (Signed)
Unable to [facility], vm full. Need to notify patient of bilateral nephrostomy tube exchange scheduled 03/20/2020 at 8:00 and script for cipro sent to [facility] to begin taking twice daily on 03/19/2020.

## 2020-02-21 ENCOUNTER — Telehealth: Payer: Self-pay | Admitting: *Deleted

## 2020-03-03 ENCOUNTER — Telehealth: Payer: Medicare Other | Admitting: Oncology

## 2020-03-07 NOTE — Telephone Encounter (Signed)
Jennifer with Hospice called to report that patient expired at 1PM today

## 2020-03-07 DEATH — deceased

## 2020-03-20 ENCOUNTER — Ambulatory Visit: Payer: Medicare Other

## 2020-06-05 IMAGING — DX DG CHEST 1V PORT
2 series · 2 of 2 positions shown · non-contrast
Comparison: Moderate-sized right pleural effusion which may be

CLINICAL DATA: Altered mental status.  Oral temperature

EXAM:
PORTABLE CHEST 1 VIEW

[chest ap (1 of 2)]
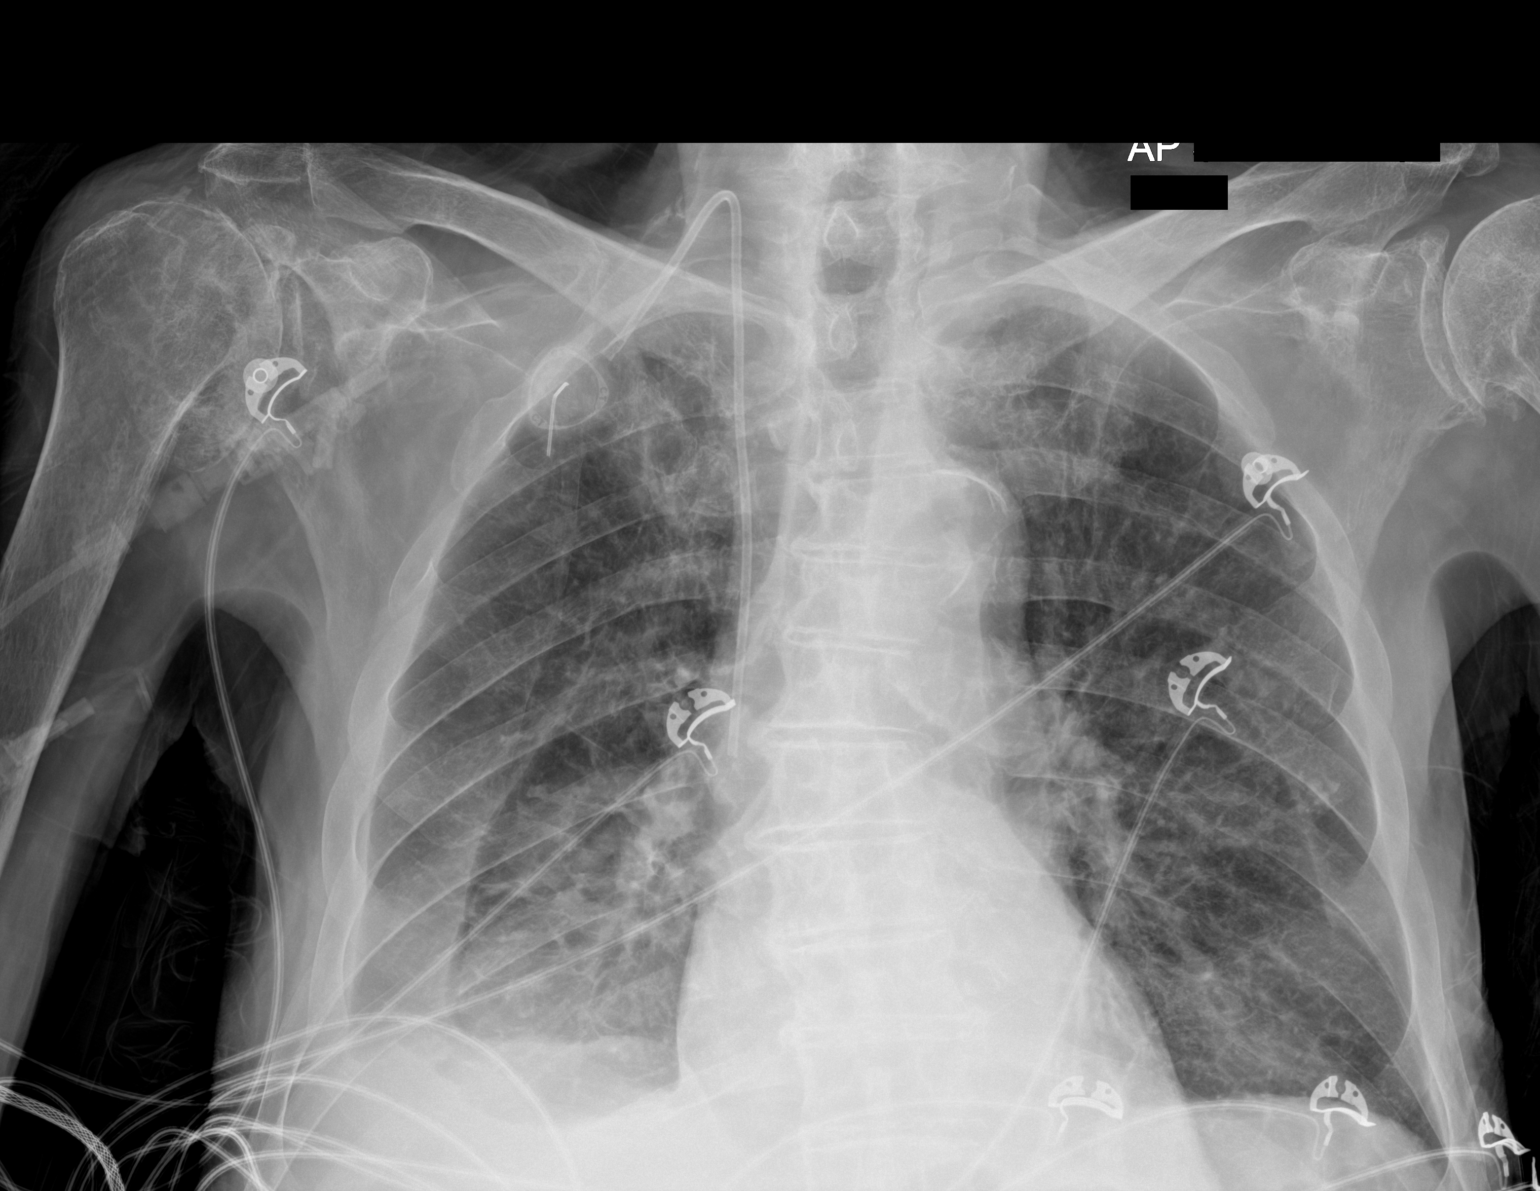

[chest ap (2 of 2)]
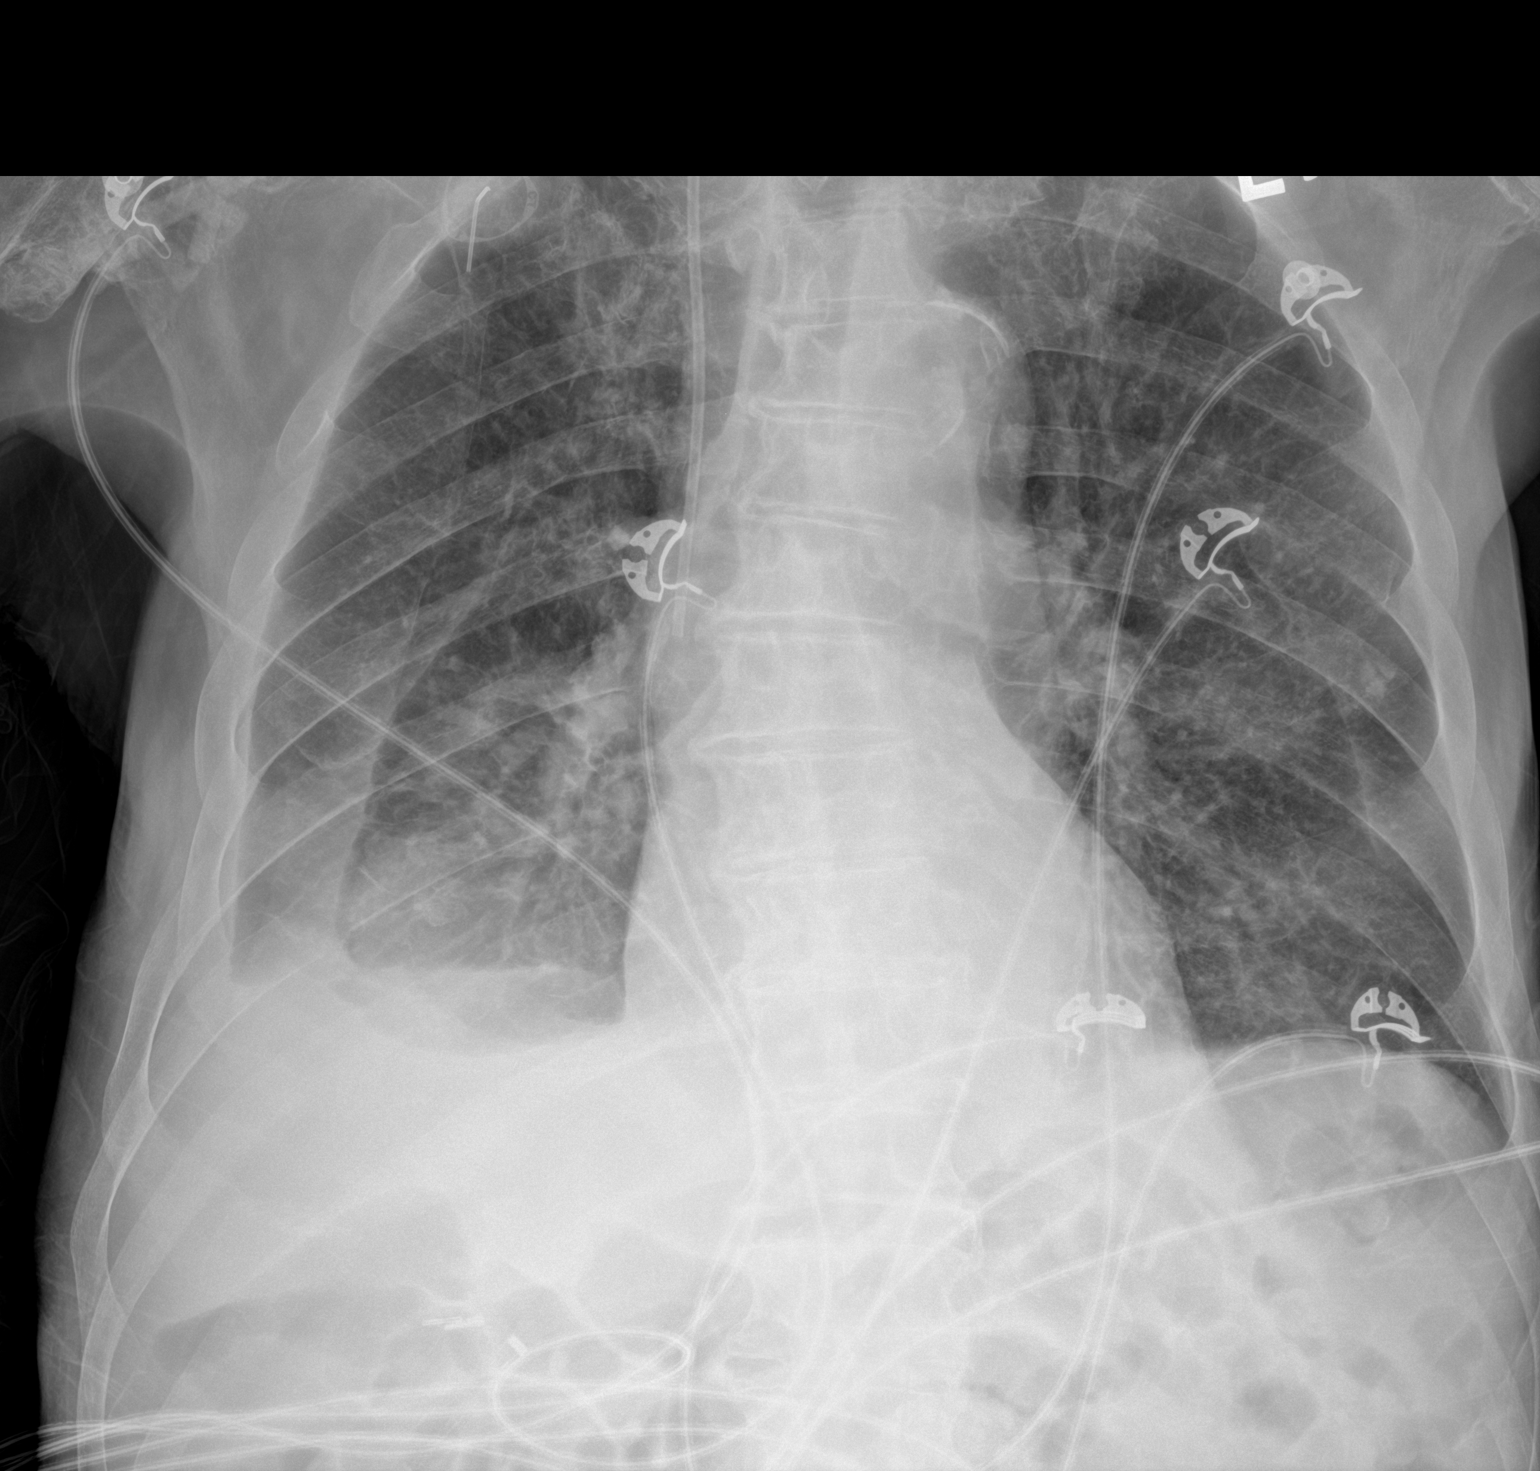

[2 of 2 positions shown; findings below may reference images not displayed]

FINDINGS: Unchanged position of a right chest infusion port catheter. Heart
size within normal limits. Aortic atherosclerosis. New from prior
examination, there is a moderate-sized right pleural effusion which
may be partially loculated. Associated right basilar opacity.
Diffuse chronic prominence of the interstitial lung markings. The
left lung is otherwise clear. No evidence of pneumothorax. No acute
bony abnormality. Thoracic spondylosis.
IMPRESSION: New moderate-sized right pleural effusion which may be partially
loculated. Right basilar atelectasis. Pneumonia at the right lung
base cannot be excluded.

Aortic atherosclerosis.
